# Patient Record
Sex: Female | Born: 1945
Health system: Southern US, Community
[De-identification: ages and names within clinical notes are randomized; demographics above are authoritative.]

## PROBLEM LIST (undated history)

## (undated) ENCOUNTER — Emergency Department (HOSPITAL_COMMUNITY): Payer: Medicare Other

## (undated) DIAGNOSIS — Z8489 Family history of other specified conditions: Secondary | ICD-10-CM

## (undated) DIAGNOSIS — R350 Frequency of micturition: Secondary | ICD-10-CM

## (undated) DIAGNOSIS — Z8719 Personal history of other diseases of the digestive system: Secondary | ICD-10-CM

## (undated) DIAGNOSIS — M2550 Pain in unspecified joint: Secondary | ICD-10-CM

## (undated) DIAGNOSIS — Z860100 Personal history of colon polyps, unspecified: Secondary | ICD-10-CM

## (undated) DIAGNOSIS — K5790 Diverticulosis of intestine, part unspecified, without perforation or abscess without bleeding: Secondary | ICD-10-CM

## (undated) DIAGNOSIS — R200 Anesthesia of skin: Secondary | ICD-10-CM

## (undated) DIAGNOSIS — D509 Iron deficiency anemia, unspecified: Secondary | ICD-10-CM

## (undated) DIAGNOSIS — M1990 Unspecified osteoarthritis, unspecified site: Secondary | ICD-10-CM

## (undated) DIAGNOSIS — K219 Gastro-esophageal reflux disease without esophagitis: Secondary | ICD-10-CM

## (undated) DIAGNOSIS — Z8709 Personal history of other diseases of the respiratory system: Secondary | ICD-10-CM

## (undated) DIAGNOSIS — C50919 Malignant neoplasm of unspecified site of unspecified female breast: Secondary | ICD-10-CM

## (undated) DIAGNOSIS — G629 Polyneuropathy, unspecified: Secondary | ICD-10-CM

## (undated) DIAGNOSIS — C50412 Malignant neoplasm of upper-outer quadrant of left female breast: Principal | ICD-10-CM

## (undated) DIAGNOSIS — Z800 Family history of malignant neoplasm of digestive organs: Secondary | ICD-10-CM

## (undated) DIAGNOSIS — C4491 Basal cell carcinoma of skin, unspecified: Secondary | ICD-10-CM

## (undated) DIAGNOSIS — R918 Other nonspecific abnormal finding of lung field: Secondary | ICD-10-CM

## (undated) DIAGNOSIS — Z8049 Family history of malignant neoplasm of other genital organs: Secondary | ICD-10-CM

## (undated) DIAGNOSIS — Z86711 Personal history of pulmonary embolism: Secondary | ICD-10-CM

## (undated) DIAGNOSIS — E559 Vitamin D deficiency, unspecified: Secondary | ICD-10-CM

## (undated) DIAGNOSIS — Z1371 Encounter for nonprocreative screening for genetic disease carrier status: Secondary | ICD-10-CM

## (undated) DIAGNOSIS — Z923 Personal history of irradiation: Secondary | ICD-10-CM

## (undated) DIAGNOSIS — IMO0002 Reserved for concepts with insufficient information to code with codable children: Secondary | ICD-10-CM

## (undated) DIAGNOSIS — R42 Dizziness and giddiness: Secondary | ICD-10-CM

## (undated) DIAGNOSIS — J302 Other seasonal allergic rhinitis: Secondary | ICD-10-CM

## (undated) DIAGNOSIS — I700 Atherosclerosis of aorta: Secondary | ICD-10-CM

## (undated) DIAGNOSIS — E119 Type 2 diabetes mellitus without complications: Secondary | ICD-10-CM

## (undated) DIAGNOSIS — I10 Essential (primary) hypertension: Secondary | ICD-10-CM

## (undated) DIAGNOSIS — Z803 Family history of malignant neoplasm of breast: Secondary | ICD-10-CM

## (undated) DIAGNOSIS — IMO0001 Reserved for inherently not codable concepts without codable children: Secondary | ICD-10-CM

## (undated) DIAGNOSIS — H269 Unspecified cataract: Secondary | ICD-10-CM

## (undated) HISTORY — DX: Reserved for concepts with insufficient information to code with codable children: IMO0002

## (undated) HISTORY — DX: Family history of malignant neoplasm of digestive organs: Z80.0

## (undated) HISTORY — PX: CATARACT EXTRACTION: SUR2

## (undated) HISTORY — DX: Type 2 diabetes mellitus without complications: E11.9

## (undated) HISTORY — PX: BREAST BIOPSY: SHX20

## (undated) HISTORY — PX: BREAST LUMPECTOMY: SHX2

## (undated) HISTORY — PX: COLONOSCOPY: SHX174

## (undated) HISTORY — DX: Reserved for inherently not codable concepts without codable children: IMO0001

## (undated) HISTORY — DX: Family history of malignant neoplasm of other genital organs: Z80.49

## (undated) HISTORY — DX: Personal history of pulmonary embolism: Z86.711

## (undated) HISTORY — DX: Malignant neoplasm of upper-outer quadrant of left female breast: C50.412

## (undated) HISTORY — DX: Family history of malignant neoplasm of breast: Z80.3

## (undated) HISTORY — PX: TUBAL LIGATION: SHX77

## (undated) HISTORY — PX: POLYPECTOMY: SHX149

---

## 1998-07-14 ENCOUNTER — Ambulatory Visit (HOSPITAL_COMMUNITY): Admission: RE | Admit: 1998-07-14 | Discharge: 1998-07-14 | Payer: Self-pay | Admitting: General Surgery

## 2007-08-15 ENCOUNTER — Other Ambulatory Visit: Admission: RE | Admit: 2007-08-15 | Discharge: 2007-08-15 | Payer: Self-pay | Admitting: Family Medicine

## 2008-09-22 ENCOUNTER — Other Ambulatory Visit: Admission: RE | Admit: 2008-09-22 | Discharge: 2008-09-22 | Payer: Self-pay | Admitting: Family Medicine

## 2009-10-20 ENCOUNTER — Encounter: Admission: RE | Admit: 2009-10-20 | Discharge: 2009-10-20 | Payer: Self-pay | Admitting: Family Medicine

## 2009-12-21 ENCOUNTER — Other Ambulatory Visit: Admission: RE | Admit: 2009-12-21 | Discharge: 2009-12-21 | Payer: Self-pay | Admitting: Family Medicine

## 2011-02-09 ENCOUNTER — Other Ambulatory Visit: Payer: Self-pay | Admitting: Family Medicine

## 2011-02-09 DIAGNOSIS — Z1231 Encounter for screening mammogram for malignant neoplasm of breast: Secondary | ICD-10-CM

## 2011-02-22 ENCOUNTER — Ambulatory Visit
Admission: RE | Admit: 2011-02-22 | Discharge: 2011-02-22 | Disposition: A | Discharge status: Home / Self Care | Payer: BC Managed Care – PPO | Source: Ambulatory Visit | Attending: Family Medicine | Admitting: Family Medicine

## 2011-02-22 DIAGNOSIS — Z1231 Encounter for screening mammogram for malignant neoplasm of breast: Secondary | ICD-10-CM

## 2011-03-16 ENCOUNTER — Other Ambulatory Visit: Payer: Self-pay | Admitting: Family Medicine

## 2011-03-16 ENCOUNTER — Other Ambulatory Visit (HOSPITAL_COMMUNITY)
Admission: RE | Admit: 2011-03-16 | Discharge: 2011-03-16 | Disposition: A | Discharge status: Home / Self Care | Payer: BC Managed Care – PPO | Source: Ambulatory Visit | Attending: Family Medicine | Admitting: Family Medicine

## 2011-03-16 DIAGNOSIS — Z1159 Encounter for screening for other viral diseases: Secondary | ICD-10-CM | POA: Insufficient documentation

## 2011-03-16 DIAGNOSIS — Z124 Encounter for screening for malignant neoplasm of cervix: Secondary | ICD-10-CM | POA: Insufficient documentation

## 2012-02-01 ENCOUNTER — Other Ambulatory Visit: Payer: Self-pay | Admitting: Family Medicine

## 2012-02-01 DIAGNOSIS — Z1231 Encounter for screening mammogram for malignant neoplasm of breast: Secondary | ICD-10-CM

## 2012-02-24 ENCOUNTER — Ambulatory Visit
Admission: RE | Admit: 2012-02-24 | Discharge: 2012-02-24 | Disposition: A | Discharge status: Home / Self Care | Payer: BC Managed Care – PPO | Source: Ambulatory Visit | Attending: Family Medicine | Admitting: Family Medicine

## 2012-02-24 DIAGNOSIS — Z1231 Encounter for screening mammogram for malignant neoplasm of breast: Secondary | ICD-10-CM

## 2012-02-27 ENCOUNTER — Other Ambulatory Visit: Payer: Self-pay | Admitting: Family Medicine

## 2012-02-27 DIAGNOSIS — R928 Other abnormal and inconclusive findings on diagnostic imaging of breast: Secondary | ICD-10-CM

## 2012-03-09 ENCOUNTER — Other Ambulatory Visit: Payer: Self-pay | Admitting: Family Medicine

## 2012-03-09 ENCOUNTER — Ambulatory Visit
Admission: RE | Admit: 2012-03-09 | Discharge: 2012-03-09 | Disposition: A | Discharge status: Home / Self Care | Payer: BC Managed Care – PPO | Source: Ambulatory Visit | Attending: Family Medicine | Admitting: Family Medicine

## 2012-03-09 DIAGNOSIS — R928 Other abnormal and inconclusive findings on diagnostic imaging of breast: Secondary | ICD-10-CM

## 2012-03-23 ENCOUNTER — Other Ambulatory Visit: Payer: Self-pay | Admitting: Family Medicine

## 2012-03-23 ENCOUNTER — Ambulatory Visit
Admission: RE | Admit: 2012-03-23 | Discharge: 2012-03-23 | Disposition: A | Discharge status: Home / Self Care | Payer: BC Managed Care – PPO | Source: Ambulatory Visit | Attending: Family Medicine | Admitting: Family Medicine

## 2012-03-23 DIAGNOSIS — R928 Other abnormal and inconclusive findings on diagnostic imaging of breast: Secondary | ICD-10-CM

## 2012-10-12 ENCOUNTER — Other Ambulatory Visit: Payer: Self-pay | Admitting: Orthopedic Surgery

## 2012-10-16 ENCOUNTER — Encounter (HOSPITAL_COMMUNITY): Payer: Self-pay | Admitting: Pharmacy Technician

## 2012-10-18 ENCOUNTER — Encounter (HOSPITAL_COMMUNITY)
Admission: RE | Admit: 2012-10-18 | Discharge: 2012-10-18 | Disposition: A | Discharge status: Home / Self Care | Payer: BC Managed Care – PPO | Source: Ambulatory Visit | Attending: Orthopedic Surgery | Admitting: Orthopedic Surgery

## 2012-10-18 ENCOUNTER — Encounter (HOSPITAL_COMMUNITY): Payer: Self-pay

## 2012-10-18 DIAGNOSIS — Z01818 Encounter for other preprocedural examination: Secondary | ICD-10-CM | POA: Insufficient documentation

## 2012-10-18 DIAGNOSIS — Z0183 Encounter for blood typing: Secondary | ICD-10-CM | POA: Insufficient documentation

## 2012-10-18 DIAGNOSIS — Z01810 Encounter for preprocedural cardiovascular examination: Secondary | ICD-10-CM | POA: Insufficient documentation

## 2012-10-18 DIAGNOSIS — Z01812 Encounter for preprocedural laboratory examination: Secondary | ICD-10-CM | POA: Insufficient documentation

## 2012-10-18 HISTORY — DX: Unspecified osteoarthritis, unspecified site: M19.90

## 2012-10-18 HISTORY — DX: Essential (primary) hypertension: I10

## 2012-10-18 HISTORY — DX: Gastro-esophageal reflux disease without esophagitis: K21.9

## 2012-10-18 LAB — CBC WITH DIFFERENTIAL/PLATELET
Basophils Absolute: 0 10*3/uL (ref 0.0–0.1)
Basophils Relative: 1 % (ref 0–1)
Hemoglobin: 13.1 g/dL (ref 12.0–15.0)
MCHC: 33.4 g/dL (ref 30.0–36.0)
Monocytes Relative: 6 % (ref 3–12)
Neutro Abs: 5 10*3/uL (ref 1.7–7.7)
Neutrophils Relative %: 65 % (ref 43–77)
RBC: 4.06 MIL/uL (ref 3.87–5.11)

## 2012-10-18 LAB — TYPE AND SCREEN: ABO/RH(D): O POS

## 2012-10-18 LAB — URINALYSIS, ROUTINE W REFLEX MICROSCOPIC
Glucose, UA: NEGATIVE mg/dL
Hgb urine dipstick: NEGATIVE
Ketones, ur: NEGATIVE mg/dL
Protein, ur: NEGATIVE mg/dL

## 2012-10-18 LAB — SURGICAL PCR SCREEN: MRSA, PCR: NEGATIVE

## 2012-10-18 LAB — BASIC METABOLIC PANEL
BUN: 16 mg/dL (ref 6–23)
GFR calc Af Amer: >90 mL/min (ref >90)
GFR calc non Af Amer: 86 mL/min — ABNORMAL LOW (ref >90)
Potassium: 3.3 mEq/L — ABNORMAL LOW (ref 3.5–5.1)
Sodium: 138 mEq/L (ref 135–145)

## 2012-10-18 LAB — PROTIME-INR: INR: 0.93 (ref 0.00–1.49)

## 2012-10-18 LAB — ABO/RH: ABO/RH(D): O POS

## 2012-10-18 LAB — URINE MICROSCOPIC-ADD ON

## 2012-10-18 NOTE — Pre-Procedure Instructions (Signed)
Kim Castro  10/18/2012   Your procedure is scheduled on:  Wednesday, June 25th  Report to [facility] at 1045 AM. Come to main entrance "A" and go to east elevators up to 3rd floor. Check in at short stay desk.  Call this number if you have problems the morning of surgery: [phone]   Remember:   Do not eat food or drink liquids after midnight.   Take these medicines the morning of surgery with A SIP OF WATER: none Stop taking Aspirin, ibuprofen 5 days prior to surgery   Do not wear jewelry, make-up or nail polish.  Do not wear lotions, powders, or perfume, deodorant.  Do not shave 48 hours prior to surgery. Men may shave face and neck.  Do not bring valuables to the hospital.  [facility] is not responsible  for any belongings or valuables.  Contacts, dentures or bridgework may not be worn into surgery.  Leave suitcase in the car. After surgery it may be brought to your room.  For patients admitted to the hospital, checkout time is 11:00 AM the day of discharge.   Patients discharged the day of surgery will not be allowed to drive home.    Special Instructions: Shower using CHG 2 nights before surgery and the night before surgery.  If you shower the day of surgery use CHG.  Use special wash - you have one bottle of CHG for all showers.  You should use approximately 1/3 of the bottle for each shower.   Please read over the following fact sheets that you were given: Pain Booklet, Coughing and Deep Breathing, Blood Transfusion Information, MRSA Information and Surgical Site Infection Prevention

## 2012-10-18 NOTE — Progress Notes (Signed)
Primary physician - dr. Caryn Bee little No recent cardiac testing had ekg several years no cardiologist

## 2012-10-18 NOTE — Progress Notes (Signed)
10/18/12 0958  OBSTRUCTIVE SLEEP APNEA  Have you ever been diagnosed with sleep apnea through a sleep study? No  Do you snore loudly (loud enough to be heard through closed doors)?  1  Do you often feel tired, fatigued, or sleepy during the daytime? 0  Has anyone observed you stop breathing during your sleep? 0  Do you have, or are you being treated for high blood pressure? 1  BMI more than 35 kg/m2? 1  Age over 65 years old? 1  Neck circumference greater than 40 cm/18 inches? 0  Gender: 0  Obstructive Sleep Apnea Score 4  Score 4 or greater  Results sent to PCP

## 2012-10-23 MED ORDER — DEXTROSE 5 % IV SOLN
3.0000 g | INTRAVENOUS | Status: AC
  Administered 2012-10-24: 3 g via INTRAVENOUS
  Filled 2012-10-23: qty 3000

## 2012-10-23 MED ORDER — DEXTROSE-NACL 5-0.45 % IV SOLN: INTRAVENOUS | Status: DC

## 2012-10-23 MED ORDER — CHLORHEXIDINE GLUCONATE 4 % EX LIQD: 60.0000 mL | Freq: Once | CUTANEOUS | Status: DC

## 2012-10-23 NOTE — H&P (Signed)
Kim Castro is an 67 y.o. female.   Chief Complaint: Left knee pain HPI: Patient returns today reporting continued pain, left greater than right knee which has known end-stage arthritis bone-on-bone medially with lateral subluxation of tibia beneath the femur.  The meloxicam that we try last month really didn't do anything she is not interested in cortisone shots or Visco supplementation and based on the x-rays that show erosion of the medial tibial plateaus with the lateral subluxation that is probably reasonable.  She has had knee pain for 7 years.  Onset was gradual and progressive.  The patient is been walking with a limp.  In the last few months the pain wakes her up at night.  The pain also makes it difficult for her to take care of her 2 grandchildren.    Past Medical History  Diagnosis Date  . Hypertension   . GERD (gastroesophageal reflux disease)   . Arthritis     Past Surgical History  Procedure Laterality Date  . Tubal ligation      No family history on file. Social History:  reports that she has never smoked. She does not have any smokeless tobacco history on file. She reports that she does not drink alcohol or use illicit drugs.  Allergies: No Known Allergies  No prescriptions prior to admission    No results found for this or any previous visit (from the past 48 hour(s)). No results found.  Review of Systems  Constitutional: Negative.   HENT: Negative.   Eyes: Negative.   Respiratory: Negative.   Cardiovascular: Negative.   Gastrointestinal: Negative.   Genitourinary: Negative.   Musculoskeletal: Positive for joint pain.  Skin: Negative.   Neurological: Negative.   Endo/Heme/Allergies: Negative.   Psychiatric/Behavioral: Negative.     There were no vitals taken for this visit. Physical Exam  Constitutional: She is oriented to person, place, and time. She appears well-developed and well-nourished.  HENT:  Head: Normocephalic.  Eyes: Pupils are equal,  round, and reactive to light.  Cardiovascular: Intact distal pulses.   Respiratory: Breath sounds normal.  Musculoskeletal:       Left knee: She exhibits decreased range of motion. Tenderness found.  Neurological: She is alert and oriented to person, place, and time.  Psychiatric: She has a normal mood and affect.     Assessment/Plan Assess: End-stage arthritis medial compartment left greater than right knee bone-on-bone with lateral subluxation of tibia beneath the femur.  Plan: After discussing options, as well as risks and benefits the patient has chosen knee replacement.  We'll get that set up for her at her convenience.  I will see her back in the office after her surgical intervention.    Yasmin Bronaugh M. 10/23/2012, 8:38 AM

## 2012-10-24 ENCOUNTER — Encounter (HOSPITAL_COMMUNITY): Payer: Self-pay | Admitting: *Deleted

## 2012-10-24 ENCOUNTER — Encounter (HOSPITAL_COMMUNITY): Payer: Self-pay | Admitting: Certified Registered"

## 2012-10-24 ENCOUNTER — Ambulatory Visit (HOSPITAL_COMMUNITY): Payer: BC Managed Care – PPO | Admitting: Certified Registered"

## 2012-10-24 ENCOUNTER — Inpatient Hospital Stay (HOSPITAL_COMMUNITY)
Admission: RE | Admit: 2012-10-24 | Discharge: 2012-10-27 | DRG: 209 | Disposition: A | Discharge status: Home / Self Care | Payer: BC Managed Care – PPO | Source: Ambulatory Visit | Attending: Orthopedic Surgery | Admitting: Orthopedic Surgery

## 2012-10-24 ENCOUNTER — Encounter (HOSPITAL_COMMUNITY)
Admission: RE | Disposition: A | Discharge status: Home / Self Care | Payer: Self-pay | Source: Ambulatory Visit | Attending: Orthopedic Surgery

## 2012-10-24 DIAGNOSIS — I10 Essential (primary) hypertension: Secondary | ICD-10-CM | POA: Diagnosis present

## 2012-10-24 DIAGNOSIS — M1712 Unilateral primary osteoarthritis, left knee: Secondary | ICD-10-CM

## 2012-10-24 DIAGNOSIS — K219 Gastro-esophageal reflux disease without esophagitis: Secondary | ICD-10-CM | POA: Diagnosis present

## 2012-10-24 DIAGNOSIS — Z7982 Long term (current) use of aspirin: Secondary | ICD-10-CM

## 2012-10-24 DIAGNOSIS — M1710 Unilateral primary osteoarthritis, unspecified knee: Principal | ICD-10-CM | POA: Diagnosis present

## 2012-10-24 HISTORY — PX: TOTAL KNEE ARTHROPLASTY: SHX125

## 2012-10-24 SURGERY — ARTHROPLASTY, KNEE, TOTAL
Anesthesia: General | Site: Knee | Laterality: Left | Wound class: Clean

## 2012-10-24 MED ORDER — BUPIVACAINE-EPINEPHRINE PF 0.5-1:200000 % IJ SOLN
INTRAMUSCULAR | Status: DC | PRN
  Administered 2012-10-24: 30 mL

## 2012-10-24 MED ORDER — NEOSTIGMINE METHYLSULFATE 1 MG/ML IJ SOLN
INTRAMUSCULAR | Status: DC | PRN
  Administered 2012-10-24: 3 mg via INTRAVENOUS

## 2012-10-24 MED ORDER — LACTATED RINGERS IV SOLN: INTRAVENOUS | Status: DC

## 2012-10-24 MED ORDER — OXYCODONE HCL 5 MG PO TABS: 5.0000 mg | ORAL_TABLET | Freq: Once | ORAL | Status: DC | PRN

## 2012-10-24 MED ORDER — LIDOCAINE HCL (CARDIAC) 20 MG/ML IV SOLN
INTRAVENOUS | Status: DC | PRN
  Administered 2012-10-24: 50 mg via INTRAVENOUS

## 2012-10-24 MED ORDER — LOSARTAN POTASSIUM 25 MG PO TABS
25.0000 mg | ORAL_TABLET | Freq: Every day | ORAL | Status: DC
  Administered 2012-10-24 – 2012-10-25 (×2): 25 mg via ORAL
  Filled 2012-10-24 (×3): qty 1

## 2012-10-24 MED ORDER — ONDANSETRON HCL 4 MG/2ML IJ SOLN
INTRAMUSCULAR | Status: DC | PRN
  Administered 2012-10-24: 4 mg via INTRAVENOUS

## 2012-10-24 MED ORDER — CEFUROXIME SODIUM 1.5 G IJ SOLR
INTRAMUSCULAR | Status: DC | PRN
  Administered 2012-10-24: 1.5 g

## 2012-10-24 MED ORDER — METHOCARBAMOL 500 MG PO TABS: 500.0000 mg | ORAL_TABLET | Freq: Four times a day (QID) | ORAL | Status: DC

## 2012-10-24 MED ORDER — KCL IN DEXTROSE-NACL 20-5-0.45 MEQ/L-%-% IV SOLN
INTRAVENOUS | Status: DC
  Administered 2012-10-24: 19:00:00 via INTRAVENOUS
  Administered 2012-10-25: 30 mL/h via INTRAVENOUS
  Filled 2012-10-24 (×10): qty 1000

## 2012-10-24 MED ORDER — ONDANSETRON HCL 4 MG PO TABS: 4.0000 mg | ORAL_TABLET | Freq: Four times a day (QID) | ORAL | Status: DC | PRN

## 2012-10-24 MED ORDER — METOCLOPRAMIDE HCL 5 MG/ML IJ SOLN: 5.0000 mg | Freq: Three times a day (TID) | INTRAMUSCULAR | Status: DC | PRN

## 2012-10-24 MED ORDER — LACTATED RINGERS IV SOLN
INTRAVENOUS | Status: DC | PRN
  Administered 2012-10-24 (×2): via INTRAVENOUS

## 2012-10-24 MED ORDER — MIDAZOLAM HCL 5 MG/5ML IJ SOLN
INTRAMUSCULAR | Status: DC | PRN
  Administered 2012-10-24: 2 mg via INTRAVENOUS

## 2012-10-24 MED ORDER — HYDROMORPHONE HCL PF 1 MG/ML IJ SOLN
1.0000 mg | INTRAMUSCULAR | Status: DC | PRN
  Administered 2012-10-24: 1 mg via INTRAVENOUS
  Filled 2012-10-24: qty 1

## 2012-10-24 MED ORDER — CELECOXIB 200 MG PO CAPS
200.0000 mg | ORAL_CAPSULE | Freq: Two times a day (BID) | ORAL | Status: DC
  Administered 2012-10-24 – 2012-10-27 (×6): 200 mg via ORAL
  Filled 2012-10-24 (×7): qty 1

## 2012-10-24 MED ORDER — PHENOL 1.4 % MT LIQD: 1.0000 | OROMUCOSAL | Status: DC | PRN

## 2012-10-24 MED ORDER — METHOCARBAMOL 100 MG/ML IJ SOLN
500.0000 mg | Freq: Four times a day (QID) | INTRAVENOUS | Status: DC | PRN
  Filled 2012-10-24: qty 5

## 2012-10-24 MED ORDER — PROPOFOL 10 MG/ML IV BOLUS
INTRAVENOUS | Status: DC | PRN
  Administered 2012-10-24: 160 mg via INTRAVENOUS
  Administered 2012-10-24: 40 mg via INTRAVENOUS

## 2012-10-24 MED ORDER — ROCURONIUM BROMIDE 100 MG/10ML IV SOLN
INTRAVENOUS | Status: DC | PRN
  Administered 2012-10-24: 50 mg via INTRAVENOUS

## 2012-10-24 MED ORDER — ACETAMINOPHEN 325 MG PO TABS
650.0000 mg | ORAL_TABLET | Freq: Four times a day (QID) | ORAL | Status: DC | PRN
  Administered 2012-10-26: 650 mg via ORAL
  Filled 2012-10-24: qty 2

## 2012-10-24 MED ORDER — BUPIVACAINE-EPINEPHRINE (PF) 0.5% -1:200000 IJ SOLN
INTRAMUSCULAR | Status: AC
  Filled 2012-10-24: qty 10

## 2012-10-24 MED ORDER — GLYCOPYRROLATE 0.2 MG/ML IJ SOLN
INTRAMUSCULAR | Status: DC | PRN
  Administered 2012-10-24: 0.4 mg via INTRAVENOUS

## 2012-10-24 MED ORDER — DIPHENHYDRAMINE HCL 12.5 MG/5ML PO ELIX: 12.5000 mg | ORAL_SOLUTION | ORAL | Status: DC | PRN

## 2012-10-24 MED ORDER — OXYCODONE-ACETAMINOPHEN 5-325 MG PO TABS: 1.0000 | ORAL_TABLET | ORAL | Status: DC | PRN

## 2012-10-24 MED ORDER — METOCLOPRAMIDE HCL 10 MG PO TABS: 5.0000 mg | ORAL_TABLET | Freq: Three times a day (TID) | ORAL | Status: DC | PRN

## 2012-10-24 MED ORDER — DEXAMETHASONE SODIUM PHOSPHATE 4 MG/ML IJ SOLN
INTRAMUSCULAR | Status: DC | PRN
  Administered 2012-10-24: 8 mg via INTRAVENOUS

## 2012-10-24 MED ORDER — OXYCODONE HCL 5 MG PO TABS
5.0000 mg | ORAL_TABLET | ORAL | Status: DC | PRN
  Administered 2012-10-25 (×3): 10 mg via ORAL
  Administered 2012-10-25 (×2): 5 mg via ORAL
  Administered 2012-10-25 – 2012-10-27 (×7): 10 mg via ORAL
  Filled 2012-10-24 (×6): qty 2
  Filled 2012-10-24: qty 1
  Filled 2012-10-24 (×2): qty 2
  Filled 2012-10-24: qty 1
  Filled 2012-10-24 (×4): qty 2

## 2012-10-24 MED ORDER — ACETAMINOPHEN 650 MG RE SUPP: 650.0000 mg | Freq: Four times a day (QID) | RECTAL | Status: DC | PRN

## 2012-10-24 MED ORDER — SODIUM CHLORIDE 0.9 % IR SOLN
Status: DC | PRN
  Administered 2012-10-24: 3000 mL

## 2012-10-24 MED ORDER — METHOCARBAMOL 500 MG PO TABS
500.0000 mg | ORAL_TABLET | Freq: Four times a day (QID) | ORAL | Status: DC | PRN
  Administered 2012-10-25 – 2012-10-27 (×5): 500 mg via ORAL
  Filled 2012-10-24 (×7): qty 1

## 2012-10-24 MED ORDER — HYDROMORPHONE HCL PF 1 MG/ML IJ SOLN
0.2500 mg | INTRAMUSCULAR | Status: DC | PRN
  Administered 2012-10-24: 0.5 mg via INTRAVENOUS

## 2012-10-24 MED ORDER — CEFUROXIME SODIUM 1.5 G IJ SOLR
INTRAMUSCULAR | Status: AC
  Filled 2012-10-24: qty 1.5

## 2012-10-24 MED ORDER — SODIUM CHLORIDE 0.9 % IJ SOLN
INTRAMUSCULAR | Status: DC | PRN
  Administered 2012-10-24: 13:00:00

## 2012-10-24 MED ORDER — MENTHOL 3 MG MT LOZG: 1.0000 | LOZENGE | OROMUCOSAL | Status: DC | PRN

## 2012-10-24 MED ORDER — ASPIRIN EC 325 MG PO TBEC
325.0000 mg | DELAYED_RELEASE_TABLET | Freq: Two times a day (BID) | ORAL | Status: DC
  Administered 2012-10-24 – 2012-10-27 (×6): 325 mg via ORAL
  Filled 2012-10-24 (×7): qty 1

## 2012-10-24 MED ORDER — HYDROCHLOROTHIAZIDE 25 MG PO TABS
25.0000 mg | ORAL_TABLET | Freq: Every day | ORAL | Status: DC
  Administered 2012-10-24 – 2012-10-25 (×2): 25 mg via ORAL
  Filled 2012-10-24 (×3): qty 1

## 2012-10-24 MED ORDER — BUPIVACAINE LIPOSOME 1.3 % IJ SUSP
20.0000 mL | Freq: Once | INTRAMUSCULAR | Status: DC
  Filled 2012-10-24: qty 20

## 2012-10-24 MED ORDER — MAGNESIUM HYDROXIDE 400 MG/5ML PO SUSP: 30.0000 mL | Freq: Every day | ORAL | Status: DC | PRN

## 2012-10-24 MED ORDER — ASPIRIN EC 325 MG PO TBEC: 325.0000 mg | DELAYED_RELEASE_TABLET | Freq: Two times a day (BID) | ORAL | Status: DC

## 2012-10-24 MED ORDER — ALUM & MAG HYDROXIDE-SIMETH 200-200-20 MG/5ML PO SUSP
30.0000 mL | ORAL | Status: DC | PRN
  Filled 2012-10-24: qty 30

## 2012-10-24 MED ORDER — FENTANYL CITRATE 0.05 MG/ML IJ SOLN
INTRAMUSCULAR | Status: DC | PRN
  Administered 2012-10-24 (×4): 50 ug via INTRAVENOUS
  Administered 2012-10-24: 100 ug via INTRAVENOUS

## 2012-10-24 MED ORDER — FLEET ENEMA 7-19 GM/118ML RE ENEM: 1.0000 | ENEMA | Freq: Once | RECTAL | Status: AC | PRN

## 2012-10-24 MED ORDER — OXYCODONE HCL 5 MG/5ML PO SOLN: 5.0000 mg | Freq: Once | ORAL | Status: DC | PRN

## 2012-10-24 MED ORDER — HYDROMORPHONE HCL PF 1 MG/ML IJ SOLN
INTRAMUSCULAR | Status: AC
  Filled 2012-10-24: qty 1

## 2012-10-24 MED ORDER — BISACODYL 5 MG PO TBEC: 5.0000 mg | DELAYED_RELEASE_TABLET | Freq: Every day | ORAL | Status: DC | PRN

## 2012-10-24 MED ORDER — ONDANSETRON HCL 4 MG/2ML IJ SOLN
4.0000 mg | Freq: Four times a day (QID) | INTRAMUSCULAR | Status: DC | PRN
  Administered 2012-10-26: 4 mg via INTRAVENOUS
  Filled 2012-10-24: qty 2

## 2012-10-24 MED ORDER — TRANEXAMIC ACID 100 MG/ML IV SOLN
1000.0000 mg | INTRAVENOUS | Status: AC
  Administered 2012-10-24: 1000 mg via INTRAVENOUS
  Filled 2012-10-24: qty 10

## 2012-10-24 SURGICAL SUPPLY — 60 items
BANDAGE ESMARK 6X9 LF (GAUZE/BANDAGES/DRESSINGS) ×1 IMPLANT
BLADE SAG 18X100X1.27 (BLADE) ×2 IMPLANT
BLADE SAW SGTL 13X75X1.27 (BLADE) ×2 IMPLANT
BLADE SURG ROTATE 9660 (MISCELLANEOUS) IMPLANT
BNDG ELASTIC 6X10 VLCR STRL LF (GAUZE/BANDAGES/DRESSINGS) ×2 IMPLANT
BNDG ESMARK 6X9 LF (GAUZE/BANDAGES/DRESSINGS) ×2
BOWL SMART MIX CTS (DISPOSABLE) ×2 IMPLANT
CAPT RP KNEE ×2 IMPLANT
CEMENT HV SMART SET (Cement) ×4 IMPLANT
CLOTH BEACON ORANGE TIMEOUT ST (SAFETY) ×2 IMPLANT
COVER SURGICAL LIGHT HANDLE (MISCELLANEOUS) ×2 IMPLANT
CUFF TOURNIQUET SINGLE 34IN LL (TOURNIQUET CUFF) IMPLANT
CUFF TOURNIQUET SINGLE 44IN (TOURNIQUET CUFF) IMPLANT
DRAPE EXTREMITY T 121X128X90 (DRAPE) ×2 IMPLANT
DRAPE U-SHAPE 47X51 STRL (DRAPES) ×2 IMPLANT
DURAPREP 26ML APPLICATOR (WOUND CARE) ×2 IMPLANT
ELECT REM PT RETURN 9FT ADLT (ELECTROSURGICAL) ×2
ELECTRODE REM PT RTRN 9FT ADLT (ELECTROSURGICAL) ×1 IMPLANT
EVACUATOR 1/8 PVC DRAIN (DRAIN) ×2 IMPLANT
GAUZE XEROFORM 1X8 LF (GAUZE/BANDAGES/DRESSINGS) ×2 IMPLANT
GLOVE BIO SURGEON STRL SZ7 (GLOVE) ×2 IMPLANT
GLOVE BIO SURGEON STRL SZ7.5 (GLOVE) ×6 IMPLANT
GLOVE BIOGEL PI IND STRL 7.0 (GLOVE) ×1 IMPLANT
GLOVE BIOGEL PI IND STRL 8 (GLOVE) ×1 IMPLANT
GLOVE BIOGEL PI INDICATOR 7.0 (GLOVE) ×1
GLOVE BIOGEL PI INDICATOR 8 (GLOVE) ×1
GLOVE BIOGEL PI ORTHO PRO 7.5 (GLOVE) ×1
GLOVE NEODERM STER SZ 7 (GLOVE) ×2 IMPLANT
GLOVE PI ORTHO PRO STRL 7.5 (GLOVE) ×1 IMPLANT
GLOVE SURG SS PI 7.5 STRL IVOR (GLOVE) ×2 IMPLANT
GOWN PREVENTION PLUS XLARGE (GOWN DISPOSABLE) ×2 IMPLANT
GOWN SRG XL XLNG 56XLVL 4 (GOWN DISPOSABLE) ×1 IMPLANT
GOWN STRL NON-REIN LRG LVL3 (GOWN DISPOSABLE) ×4 IMPLANT
GOWN STRL NON-REIN XL XLG LVL4 (GOWN DISPOSABLE) ×1
HANDPIECE INTERPULSE COAX TIP (DISPOSABLE) ×1
HOOD PEEL AWAY FACE SHEILD DIS (HOOD) ×4 IMPLANT
KIT BASIN OR (CUSTOM PROCEDURE TRAY) ×2 IMPLANT
KIT ROOM TURNOVER OR (KITS) ×2 IMPLANT
MANIFOLD NEPTUNE II (INSTRUMENTS) ×2 IMPLANT
NEEDLE 18GX1X1/2 (RX/OR ONLY) (NEEDLE) ×2 IMPLANT
NEEDLE 22X1 1/2 (OR ONLY) (NEEDLE) ×2 IMPLANT
NS IRRIG 1000ML POUR BTL (IV SOLUTION) ×2 IMPLANT
PACK TOTAL JOINT (CUSTOM PROCEDURE TRAY) ×2 IMPLANT
PAD ARMBOARD 7.5X6 YLW CONV (MISCELLANEOUS) ×4 IMPLANT
PADDING CAST COTTON 6X4 STRL (CAST SUPPLIES) ×2 IMPLANT
SET HNDPC FAN SPRY TIP SCT (DISPOSABLE) ×1 IMPLANT
SPONGE GAUZE 4X4 12PLY (GAUZE/BANDAGES/DRESSINGS) ×2 IMPLANT
STAPLER VISISTAT 35W (STAPLE) ×4 IMPLANT
SUCTION FRAZIER TIP 10 FR DISP (SUCTIONS) ×2 IMPLANT
SUT VIC AB 0 CTX 36 (SUTURE) ×2
SUT VIC AB 0 CTX36XBRD ANTBCTR (SUTURE) ×2 IMPLANT
SUT VIC AB 1 CTX 36 (SUTURE) ×1
SUT VIC AB 1 CTX36XBRD ANBCTR (SUTURE) ×1 IMPLANT
SUT VIC AB 2-0 CT1 27 (SUTURE) ×1
SUT VIC AB 2-0 CT1 TAPERPNT 27 (SUTURE) ×1 IMPLANT
SYR CONTROL 10ML LL (SYRINGE) ×2 IMPLANT
TOWEL OR 17X24 6PK STRL BLUE (TOWEL DISPOSABLE) ×2 IMPLANT
TOWEL OR 17X26 10 PK STRL BLUE (TOWEL DISPOSABLE) ×2 IMPLANT
TRAY FOLEY CATH 14FR (SET/KITS/TRAYS/PACK) ×2 IMPLANT
WATER STERILE IRR 1000ML POUR (IV SOLUTION) ×2 IMPLANT

## 2012-10-24 NOTE — Transfer of Care (Signed)
Immediate Anesthesia Transfer of Care Note  Patient: Kim Castro  Procedure(s) Performed: Procedure(s): TOTAL KNEE ARTHROPLASTY (Left)  Patient Location: PACU  Anesthesia Type:General  Level of Consciousness: awake, alert  and oriented  Airway & Oxygen Therapy: Patient Spontanous Breathing and Patient connected to face mask oxygen  Post-op Assessment: Report given to PACU RN, Post -op Vital signs reviewed and stable and Patient moving all extremities  Post vital signs: Reviewed and stable  Complications: No apparent anesthesia complications

## 2012-10-24 NOTE — Anesthesia Postprocedure Evaluation (Signed)
  Anesthesia Post-op Note  Patient: Kim Castro  Procedure(s) Performed: Procedure(s): TOTAL KNEE ARTHROPLASTY (Left)  Patient Location: PACU  Anesthesia Type:General  Level of Consciousness: awake and alert   Airway and Oxygen Therapy: Patient Spontanous Breathing  Post-op Pain: moderate  Post-op Assessment: Post-op Vital signs reviewed  Post-op Vital Signs: stable  Complications: No apparent anesthesia complications

## 2012-10-24 NOTE — Interval H&P Note (Signed)
History and Physical Interval Note:  10/24/2012 12:16 PM  Kim Castro  has presented today for surgery, with the diagnosis of LEFT KNEE OSTEOARTHRITIS  The various methods of treatment have been discussed with the patient and family. After consideration of risks, benefits and other options for treatment, the patient has consented to  Procedure(s): TOTAL KNEE ARTHROPLASTY (Left) as a surgical intervention .  The patient's history has been reviewed, patient examined, no change in status, stable for surgery.  I have reviewed the patient's chart and labs.  Questions were answered to the patient's satisfaction.     Nestor Lewandowsky

## 2012-10-24 NOTE — Anesthesia Procedure Notes (Signed)
Procedure Name: Intubation Date/Time: 10/24/2012 12:50 PM Performed by: Jerilee Hoh Pre-anesthesia Checklist: Patient identified, Emergency Drugs available, Suction available and Patient being monitored Patient Re-evaluated:Patient Re-evaluated prior to inductionOxygen Delivery Method: Circle system utilized Preoxygenation: Pre-oxygenation with 100% oxygen Intubation Type: IV induction Ventilation: Mask ventilation without difficulty Laryngoscope Size: Mac and 3 Grade View: Grade II Tube type: Oral Tube size: 7.5 mm Number of attempts: 1 Airway Equipment and Method: Stylet Placement Confirmation: ETT inserted through vocal cords under direct vision,  positive ETCO2 and breath sounds checked- equal and bilateral Secured at: 21 cm Tube secured with: Tape Dental Injury: Teeth and Oropharynx as per pre-operative assessment  Comments: Smooth IV induction. Easy mask airway.  2 attempts to place LMA, but unable to obtain seal.  DL x 1, atraumatic intubation.  OGT to suction.

## 2012-10-24 NOTE — Anesthesia Preprocedure Evaluation (Addendum)
Anesthesia Evaluation  Patient identified by MRN, date of birth, ID band Patient awake    Reviewed: Allergy & Precautions, H&P , NPO status , Patient's Chart, lab work & pertinent test results  Airway Mallampati: III TM Distance: >3 FB Neck ROM: Full    Dental no notable dental hx. (+) Teeth Intact and Dental Advisory Given   Pulmonary neg pulmonary ROS,  breath sounds clear to auscultation  Pulmonary exam normal       Cardiovascular hypertension, On Medications Rhythm:Regular Rate:Normal     Neuro/Psych negative neurological ROS  negative psych ROS   GI/Hepatic Neg liver ROS, GERD-  Controlled,  Endo/Other  negative endocrine ROSMorbid obesity  Renal/GU negative Renal ROS  negative genitourinary   Musculoskeletal   Abdominal (+) + obese,   Peds  Hematology negative hematology ROS (+)   Anesthesia Other Findings   Reproductive/Obstetrics negative OB ROS                           Anesthesia Physical Anesthesia Plan  ASA: II  Anesthesia Plan: General   Post-op Pain Management:    Induction: Intravenous  Airway Management Planned: LMA  Additional Equipment:   Intra-op Plan:   Post-operative Plan: Extubation in OR  Informed Consent: I have reviewed the patients History and Physical, chart, labs and discussed the procedure including the risks, benefits and alternatives for the proposed anesthesia with the patient or authorized representative who has indicated his/her understanding and acceptance.   Dental advisory given  Plan Discussed with: CRNA  Anesthesia Plan Comments:        Anesthesia Quick Evaluation

## 2012-10-24 NOTE — Preoperative (Signed)
Beta Blockers   Reason not to administer Beta Blockers:Not Applicable 

## 2012-10-24 NOTE — Progress Notes (Signed)
Orthopedic Tech Progress Note Patient Details:  Kim Castro 08-05-1945 [id number]  CPM Left Knee CPM Left Knee: On Left Knee Flexion (Degrees): 60 Left Knee Extension (Degrees): 0 Put on ohf [name] 10/24/2012, 6:00 PM

## 2012-10-24 NOTE — Op Note (Signed)
PATIENT ID:      Kim Castro  MRN:     [id number] DOB/AGE:    12-04-1945 / 67 y.o.       OPERATIVE REPORT    DATE OF PROCEDURE:  10/24/2012       PREOPERATIVE DIAGNOSIS:   LEFT KNEE OSTEOARTHRITIS      There is no weight on file to calculate BMI.                                                        POSTOPERATIVE DIAGNOSIS:   LEFT KNEE OSTEOARTHRITIS                                                                      PROCEDURE:  Procedure(s): TOTAL KNEE ARTHROPLASTY Using Depuy Sigma RP implants #4NL Femur, #3Tibia, 10mm sigma RP bearing, 35 Patella     SURGEON: Jance Siek J    ASSISTANT:   Shirl Harris PA-C   (Present and scrubbed throughout the case, critical for assistance with exposure, retraction, instrumentation, and closure.)         ANESTHESIA: GET , Exparel  DRAINS: foley, 2 medium hemovac in knee   TOURNIQUET TIME:   COMPLICATIONS:  None     SPECIMENS: None   INDICATIONS FOR PROCEDURE: The patient has  LEFT KNEE OSTEOARTHRITIS, varus deformities, XR shows bone on bone arthritis. Patient has failed all conservative measures including anti-inflammatory medicines, narcotics, attempts at  exercise and weight loss, cortisone injections and viscosupplementation.  Risks and benefits of surgery have been discussed, questions answered.   DESCRIPTION OF PROCEDURE: The patient identified by armband, received  IV antibiotics, in the holding area at [facility]. Patient taken to the operating room, appropriate anesthetic  monitors were attached, and general endotracheal anesthesia induced with  the patient in supine position, Foley catheter was inserted. Tourniquet  applied high to the operative thigh. Lateral post and foot positioner  applied to the table, the lower extremity was then prepped and draped  in usual sterile fashion from the ankle to the tourniquet. Time-out procedure was performed. The limb was wrapped with an Esmarch bandage and the tourniquet  inflated to 350 mmHg. We began the operation by making the anterior midline incision starting at handbreadth above the patella going over the patella 1 cm medial to and  4 cm distal to the tibial tubercle. Small bleeders in the skin and the  subcutaneous tissue identified and cauterized. Transverse retinaculum was incised and reflected medially and a medial parapatellar arthrotomy was accomplished. the patella was everted and theprepatellar fat pad resected. The superficial medial collateral  ligament was then elevated from anterior to posterior along the proximal  flare of the tibia and anterior half of the menisci resected. The knee was hyperflexed exposing bone on bone arthritis. Peripheral and notch osteophytes as well as the cruciate ligaments were then resected. We continued to  work our way around posteriorly along the proximal tibia, and externally  rotated the tibia subluxing it out from underneath the femur. A McHale  retractor was placed through the  notch and a lateral Hohmann retractor  placed, and we then drilled through the proximal tibia in line with the  axis of the tibia followed by an intramedullary guide rod and 2-degree  posterior slope cutting guide. The tibial cutting guide was pinned into place  allowing resection of 4 mm of bone medially and about 9 mm of bone  laterally because of her varus deformity. Satisfied with the tibial resection, we then  entered the distal femur 2 mm anterior to the PCL origin with the  intramedullary guide rod and applied the distal femoral cutting guide  set at 11mm, with 5 degrees of valgus. This was pinned along the  epicondylar axis. At this point, the distal femoral cut was accomplished without difficulty. We then sized for a #4NL femoral component and pinned the guide in 3 degrees of external rotation.The chamfer cutting guide was pinned into place. The anterior, posterior, and chamfer cuts were accomplished without difficulty followed by   the Sigma RP box cutting guide and the box cut. We also removed posterior osteophytes from the posterior femoral condyles. At this  time, the knee was brought into full extension. We checked our  extension and flexion gaps and found them symmetric at 10mm.  The patella thickness measured at 24 mm. We set the cutting guide at 15 and removed the posterior 10 mm  of the patella, sized for a 35 button and drilled the lollipop. The knee  was then once again hyperflexed exposing the proximal tibia. We sized for a #3 tibial base plate, applied the smokestack and the conical reamer followed by the the Delta fin keel punch. We then hammered into place the Sigma RP trial femoral component, inserted a 10-mm trial bearing, trial patellar button, and took the knee through range of motion from 0-130 degrees. No thumb pressure was required for patellar  tracking. At this point, all trial components were removed, a double batch of DePuy HV cement with 1500 mg of Zinacef was mixed and applied to all bony metallic mating surfaces except for the posterior condyles of the femur itself. In order, we  hammered into place the tibial tray and removed excess cement, the femoral component and removed excess cement, a 10-mm Sigma RP bearing  was inserted, and the knee brought to full extension with compression.  The patellar button was clamped into place, and excess cement  removed. While the cement cured the wound was irrigated out with normal saline solution pulse lavage, and medium Hemovac drains were placed from an anterolateral  approach. Ligament stability and patellar tracking were checked and found to be excellent. The parapatellar arthrotomy was closed with  running #1 Vicryl suture. The subcutaneous tissue with 0 and 2-0 undyed  Vicryl suture, and the skin with skin staples. A dressing of Xeroform,  4 x 4, dressing sponges, Webril, and Ace wrap applied. The patient  awakened, extubated, and taken to recovery room  without difficulty.   Gean Birchwood J 10/24/2012, 2:25 PM

## 2012-10-25 LAB — CBC
HCT: 33.1 % — ABNORMAL LOW (ref 36.0–46.0)
Hemoglobin: 10.8 g/dL — ABNORMAL LOW (ref 12.0–15.0)
MCV: 97.4 fL (ref 78.0–100.0)
RDW: 14.3 % (ref 11.5–15.5)
WBC: 14.8 10*3/uL — ABNORMAL HIGH (ref 4.0–10.5)

## 2012-10-25 LAB — BASIC METABOLIC PANEL
BUN: 11 mg/dL (ref 6–23)
Chloride: 102 mEq/L (ref 96–112)
Creatinine, Ser: 0.7 mg/dL (ref 0.50–1.10)
GFR calc Af Amer: >90 mL/min (ref >90)
Glucose, Bld: 160 mg/dL — ABNORMAL HIGH (ref 70–99)
Potassium: 4.1 mEq/L (ref 3.5–5.1)

## 2012-10-25 NOTE — Progress Notes (Signed)
UR COMPLETED  

## 2012-10-25 NOTE — Progress Notes (Signed)
Physical Therapy Treatment Patient Details Name: Kim Castro MRN: [id number] DOB: 08-18-1945 Today's Date: 10/25/2012 Time: 4098-1191 PT Time Calculation (min): 17 min  PT Assessment / Plan / Recommendation  PT Comments   Pt continues to make progress and able to increase ambulation distance.  Pt will need to practice steps tomorrow prior to d/c and review HEP.   Follow Up Recommendations  Home health PT;Supervision/Assistance - 24 hour     Equipment Recommendations  Rolling walker with 5" wheels;3in1 (PT)    Recommendations for Other Services    Frequency 7X/week   Progress towards PT Goals Progress towards PT goals: Progressing toward goals  Plan Current plan remains appropriate    Precautions / Restrictions Precautions Precautions: Knee Restrictions Weight Bearing Restrictions: Yes LLE Weight Bearing: Weight bearing as tolerated   Pertinent Vitals/Pain 3/10 left LE & increases 8/10 during ambulation and weight bearing on left LE    Mobility  Bed Mobility Bed Mobility: Sit to Supine Sit to Supine: 4: Min assist;HOB flat Details for Bed Mobility Assistance: (A) with left LE into bed with cues for proper technique Transfers Transfers: Sit to Stand;Stand to Sit Sit to Stand: 4: Min guard;From chair/3-in-1 Stand to Sit: 4: Min guard;To bed Details for Transfer Assistance: Minguard for safety with cues for hand and LE placement Ambulation/Gait Ambulation/Gait Assistance: 4: Min guard Ambulation Distance (Feet): 50 Feet Assistive device: Rolling walker Ambulation/Gait Assistance Details: Minguard for safety with cues for upright posture and forward head gaze with cues for proper step sequencwe. Gait Pattern: Step-to pattern;Shuffle;Trunk flexed Gait velocity: decreased Stairs: No Wheelchair Mobility Wheelchair Mobility: No    Exercises     PT Diagnosis:    PT Problem List:   PT Treatment Interventions:     PT Goals (current goals can now be found in the care  plan section) Acute Rehab PT Goals Patient Stated Goal: To go home tomorrow  Visit Information  Last PT Received On: 10/25/12 Assistance Needed: +1    Subjective Data  Subjective: "You don't rest much in the hospital do you?" Patient Stated Goal: To go home tomorrow   Cognition  Cognition Arousal/Alertness: Awake/alert Behavior During Therapy: WFL for tasks assessed/performed Overall Cognitive Status: Within Functional Limits for tasks assessed    Balance     End of Session PT - End of Session Equipment Utilized During Treatment: Gait belt Activity Tolerance: Patient tolerated treatment well Patient left: in bed;with call bell/phone within reach;in CPM Nurse Communication: Mobility status   GP     Kim Castro 10/25/2012, 1:46 PM Jake Shark, PT DPT [phone]

## 2012-10-25 NOTE — Progress Notes (Signed)
Patient ID: Kim Castro, female   DOB: 07-10-1945, 67 y.o.   MRN: [id number] PATIENT ID: Kim Castro  MRN: [id number]  DOB/AGE:  03/13/1946 / 67 y.o.  1 Day Post-Op Procedure(s) (LRB): TOTAL KNEE ARTHROPLASTY (Left)    PROGRESS NOTE Subjective: Patient is alert, oriented, no Nausea, no Vomiting, yes passing gas, no Bowel Movement. Taking PO sips. Denies SOB, Chest or Calf Pain. Using Incentive Spirometer, PAS in place. Ambulate WBAT today, CPM 0-60 Patient reports pain as 3 on 0-10 scale  .    Objective: Vital signs in last 24 hours: Filed Vitals:   10/24/12 1655 10/24/12 2047 10/25/12 0130 10/25/12 0647  BP:  116/50 130/59 126/63  Pulse: 89 88 68 67  Temp: 97.8 F (36.6 C) 98.5 F (36.9 C) 97.4 F (36.3 C) 98.1 F (36.7 C)  TempSrc:   Oral Oral  Resp: 16 18 18 18   SpO2: 93% 96% 97% 97%      Intake/Output from previous day: I/O last 3 completed shifts: In: 2630 [P.O.:480; I.V.:2150] Out: 2900 [Urine:2450; Drains:375; Blood:75]   Intake/Output this shift:     LABORATORY DATA:  Recent Labs  10/25/12 0455  WBC 14.8*  HGB 10.8*  HCT 33.1*  PLT 308  NA 139  K 4.1  CL 102  CO2 32  BUN 11  CREATININE 0.70  GLUCOSE 160*  CALCIUM 8.8    Examination: Neurologically intact ABD soft Neurovascular intact Sensation intact distally Intact pulses distally Dorsiflexion/Plantar flexion intact Incision: no drainage No cellulitis present Compartment soft} Blood and plasma separated in drain indicating minimal recent drainage, drain pulled without difficulty.  Assessment:   1 Day Post-Op Procedure(s) (LRB): TOTAL KNEE ARTHROPLASTY (Left) ADDITIONAL DIAGNOSIS:  Hypertension  Plan: PT/OT WBAT, CPM 5/hrs day until ROM 0-90 degrees, then D/C CPM DVT Prophylaxis:  SCDx72hrs, ASA 325 mg BID x 2 weeks DISCHARGE PLAN: Home, probably tomkorrow DISCHARGE NEEDS: HHPT, HHRN, CPM, Walker and 3-in-1 comode seat     Delinda Malan J 10/25/2012, 7:42 AM

## 2012-10-25 NOTE — Evaluation (Signed)
Physical Therapy Evaluation Patient Details Name: Kim Castro MRN: [id number] DOB: [birth year] Today's Date: 10/25/2012 Time: 7829-5621 PT Time Calculation (min): 25 min  PT Assessment / Plan / Recommendation History of Present Illness   s/p left TKA  Clinical Impression  Pt will benefit from acute PT services to improve overall mobility and prepare for safe d/c home with family    PT Assessment  Patient needs continued PT services    Follow Up Recommendations  Home health PT;Supervision/Assistance - 24 hour    Equipment Recommendations  Rolling walker with 5" wheels;3in1 (PT)    Frequency 7X/week    Precautions / Restrictions Precautions Precautions: Knee Restrictions Weight Bearing Restrictions: Yes LLE Weight Bearing: Weight bearing as tolerated   Pertinent Vitals/Pain 5/10 left knee      Mobility  Bed Mobility Bed Mobility: Sit to Supine Sit to Supine: 4: Min assist;With rail Details for Bed Mobility Assistance: (A) with left LE OOB with cues for technique and use of rail Transfers Transfers: Sit to Stand;Stand to Sit Sit to Stand: 4: Min assist;From bed Stand to Sit: 4: Min assist;To chair/3-in-1 Details for Transfer Assistance: (A) to initiate transfer with cues for hand placement and LE placement Ambulation/Gait Ambulation/Gait Assistance: 4: Min assist Ambulation Distance (Feet): 40 Feet Assistive device: Rolling walker Ambulation/Gait Assistance Details: (A) to manage RW and cues for proper step sequence and body position within RW. Gait Pattern: Step-to pattern;Shuffle;Trunk flexed Gait velocity: decreased Stairs: No Wheelchair Mobility Wheelchair Mobility: No    Exercises Total Joint Exercises Ankle Circles/Pumps: AAROM;Strengthening;Both;10 reps Quad Sets: AAROM;Strengthening;Left;5 reps Heel Slides: AAROM;Strengthening;Left;5 reps Goniometric ROM: 5-40   PT Diagnosis: Difficulty walking;Acute pain  PT Problem List: Decreased  strength;Decreased range of motion;Decreased activity tolerance;Decreased balance;Decreased mobility;Decreased knowledge of use of DME;Decreased knowledge of precautions;Pain PT Treatment Interventions: DME instruction;Gait training;Stair training;Functional mobility training;Therapeutic activities;Therapeutic exercise;Balance training;Patient/family education     PT Goals(Current goals can be found in the care plan section) Acute Rehab PT Goals Patient Stated Goal: To go home tomorrow PT Goal Formulation: With patient Potential to Achieve Goals: Good  Visit Information  Last PT Received On: 10/25/12 Assistance Needed: +1       Prior Functioning  Home Living Family/patient expects to be discharged to:: Private residence Living Arrangements: Spouse/significant other Available Help at Discharge: Family;Available 24 hours/day Type of Home: House Home Access: Stairs to enter [employer] of Steps: 2 Entrance Stairs-Rails: None Home Layout: Two level;Able to live on main level with bedroom/bathroom Home Equipment: None Prior Function Level of Independence: Independent Communication Communication: No difficulties Dominant Hand: Right    Cognition  Cognition Arousal/Alertness: Awake/alert Behavior During Therapy: WFL for tasks assessed/performed Overall Cognitive Status: Within Functional Limits for tasks assessed    Extremity/Trunk Assessment     Balance    End of Session PT - End of Session Equipment Utilized During Treatment: Gait belt Activity Tolerance: Patient tolerated treatment well Patient left: in chair;with call bell/phone within reach Nurse Communication: Mobility status CPM Left Knee CPM Left Knee: Off  GP     Nanea Jared 10/25/2012, 10:57 AM Jake Shark, PT DPT [phone]

## 2012-10-25 NOTE — Care Management Note (Signed)
    Page 1 of 2   10/25/2012     3:47:37 PM   CARE MANAGEMENT NOTE 10/25/2012  Patient:  Kim Castro, Kim Castro   Account Number:  [id number]  Date Initiated:  10/25/2012  Documentation initiated by:  [facility]  Subjective/Objective Assessment:   admitted postop left total knee     Action/Plan:   PT eval- recommended HHPT   Anticipated DC Date:  10/26/2012   Anticipated DC Plan:  HOME W HOME HEALTH SERVICES      DC Planning Services  CM consult      Choice offered to / List presented to:     DME arranged  3-N-1  WALKER - ROLLING  CPM      DME agency  [employer]     HH arranged  HH-1 RN  HH-2 PT  HH-3 OT      [facility] agency  [facility]   Status of service:  Completed, signed off Medicare Important Message given?   (If response is "NO", the following Medicare IM given date fields will be blank) Date Medicare IM given:   Date Additional Medicare IM given:    Discharge Disposition:  HOME W HOME HEALTH SERVICES  Per UR Regulation:    If discussed at Long Length of Stay Meetings, dates discussed:    Comments:  10/25/12 Patent was set up with [facility] for [facility], HHPT and HHOT by MD office. Spoke with patien and daughtert, no change to d/c plans. Contacted Shamara Soza at [place] and [facility] is set up. 3N1 and rolling walker delivered to patient's room by [employer]. Jacquelynn Cree RN, BSN, CCM

## 2012-10-25 NOTE — Progress Notes (Signed)
Orthopedic Tech Progress Note Patient Details:  Kim Castro 1946/02/07 [id number] Patient already in CPM at this time. Patient stated therapy place her in CPM after working with her.  Patient ID: Kim Castro, female   DOB: [birth year], 67 y.o.   MRN: [id number]   [name] 10/25/2012, 3:33 PM

## 2012-10-26 ENCOUNTER — Encounter (HOSPITAL_COMMUNITY): Payer: Self-pay | Admitting: Orthopedic Surgery

## 2012-10-26 LAB — CBC
HCT: 32.1 % — ABNORMAL LOW (ref 36.0–46.0)
Hemoglobin: 10.5 g/dL — ABNORMAL LOW (ref 12.0–15.0)
MCH: 32 pg (ref 26.0–34.0)
MCHC: 32.7 g/dL (ref 30.0–36.0)
MCV: 97.9 fL (ref 78.0–100.0)
RDW: 14.6 % (ref 11.5–15.5)

## 2012-10-26 NOTE — Progress Notes (Signed)
Physical Therapy Treatment Patient Details Name: Kim Castro MRN: [id number] DOB: 02-29-1946 Today's Date: 10/26/2012 Time: 6213-0865 PT Time Calculation (min): 46 min  PT Assessment / Plan / Recommendation  PT Comments   Pt quite symptomatic when up today, reports nausea and mild dizziness despite determination to 'do what it takes to get home.'  Unable to assess standing or seated BP, however at end of session pt returned supine HOB 30 and BP = 129/53.  Pt nauseated and sensitive to overhead light and to movement.  Able to practice small step this morning, however has to get into van to go home and to ascend 2 steps to enter home.  Discussed with patient risk of fall/fall related injury based on current status and concern for safety if attempt to d/c today.  Agreeable to pm session to assess status and tolerance of mobility activities, however I feel strongly pt will benefit from an additional day in acute setting to stabilize and demonstrate increased safety and independence with mobility sufficient to access home.  Do NOT recommend d/c today, and will reassess in pm.  Have discussed with RN as well.  Follow Up Recommendations  Home health PT;Supervision/Assistance - 24 hour     Does the patient have the potential to tolerate intense rehabilitation     Barriers to Discharge  2 STE, access van for transportation home.  Should address barriers with additional day of therapy/acute care.      Equipment Recommendations  Rolling walker with 5" wheels;3in1 (PT)    Recommendations for Other Services    Frequency 7X/week   Progress towards PT Goals Progress towards PT goals: Progressing toward goals  Plan Current plan remains appropriate    Precautions / Restrictions Precautions Precautions: Knee Restrictions Weight Bearing Restrictions: Yes LLE Weight Bearing: Weight bearing as tolerated   Pertinent Vitals/Pain Premedicated, moderate pain especially into sitting; hypotensive symptoms  limiting progress today.    Mobility  Bed Mobility Bed Mobility: Supine to Sit;Sit to Supine Supine to Sit: 3: Mod assist;HOB flat Sit to Supine: 3: Mod assist;HOB flat Details for Bed Mobility Assistance: encouragement, along with verbal cues and physical assist to transition to right EOB and to raise trunk, control left leg to complete transition.  Upon return to bed, assist to lift left leg onto Left EOB and to control speed and position of trunk.  Pt symptomatic from standing/sitting and BP check once back supine 129/52 reported to RN Transfers Transfers: Sit to Stand;Stand to Sit Sit to Stand: 5: Supervision;From elevated surface;With upper extremity assist;From bed;From chair/3-in-1;From toilet Stand to Sit: 4: Min assist;With upper extremity assist;Without upper extremity assist;With armrests;To bed;To chair/3-in-1;To toilet Details for Transfer Assistance: Upon sitting on toilet, pt safely postions in preparation, uses side rail appropriately and postions operated limb safely without cue.  Sitting to chair without armrests, req min assist to control speed of descent and reminder cues to position left leg.   Ambulation/Gait Ambulation/Gait Assistance: 4: Min guard Ambulation Distance (Feet): 20 Feet Assistive device: Rolling walker Ambulation/Gait Assistance Details: encouragement, along with verbal cues to reinforce gait pattern postoperatively.  Constant checks on subjective symptoms of hypotension, pt progressively more nauseated and 'off balance' unrelieved by cool drink of water or cool cloths to forehead/posterior neck, pt consenting to return to supine once nausea under control. Once repostioned supine, pt placed in [facility] 30 and RN contacted. See comments on session. Gait Pattern: Step-to pattern;Antalgic;Trunk flexed Gait velocity: distance covered too short to measure, but subjectively very slow.  Stairs: Yes Stair Management Technique: No rails;With walker;Forwards (used portable  platform step to practice at [employer]) Number of Stairs: 1 (repeated x2 trials) Naval architect Mobility: No    Exercises  Pt unable to participate in exercises due to symptoms   PT Diagnosis:    PT Problem List:   PT Treatment Interventions:     PT Goals (current goals can now be found in the care plan section) Acute Rehab PT Goals Patient Stated Goal: To go home tomorrow  Visit Information  Last PT Received On: 10/26/12 Assistance Needed: +1 History of Present Illness: s/p left TKA    Subjective Data  Subjective: I want to go home.  I can't sleep here. Patient Stated Goal: To go home tomorrow   Cognition  Cognition Arousal/Alertness: Awake/alert Behavior During Therapy: WFL for tasks assessed/performed Overall Cognitive Status: Within Functional Limits for tasks assessed    Balance     End of Session PT - End of Session Patient left: in bed;with call bell/phone within reach;with nursing/sitter in room Nurse Communication: Mobility status;Patient requests pain meds (request for nausea meds and recheck vitals)   GP     Dennis Bast 10/26/2012, 10:47 AM

## 2012-10-26 NOTE — Progress Notes (Addendum)
PATIENT ID: Kim Castro  MRN: [id number]  DOB/AGE:  January 02, 1946 / 67 y.o.  2 Days Post-Op Procedure(s) (LRB): TOTAL KNEE ARTHROPLASTY (Left)    PROGRESS NOTE Subjective: Patient is alert, oriented, no Nausea, no Vomiting, yes passing gas, no Bowel Movement. Taking PO well. Denies SOB, Chest or Calf Pain. Using Incentive Spirometer, PAS in place. Ambulating well with PT., CPM 0-60 Patient reports pain as moderate  .    Objective: Vital signs in last 24 hours: Filed Vitals:   10/25/12 2000 10/25/12 2157 10/26/12 0000 10/26/12 0527  BP:  135/49  109/40  Pulse:  75  71  Temp:  99 F (37.2 C)  98.1 F (36.7 C)  TempSrc:      Resp: 16 14 18 16   SpO2:  95%  97%      Intake/Output from previous day: I/O last 3 completed shifts: In: 980 [P.O.:480; I.V.:500] Out: 2350 [Urine:2100; Drains:250]   Intake/Output this shift:     LABORATORY DATA:  Recent Labs  10/25/12 0455 10/26/12 0430  WBC 14.8* 12.5*  HGB 10.8* 10.5*  HCT 33.1* 32.1*  PLT 308 262  NA 139  --   K 4.1  --   CL 102  --   CO2 32  --   BUN 11  --   CREATININE 0.70  --   GLUCOSE 160*  --   CALCIUM 8.8  --     Examination: Neurologically intact ABD soft Neurovascular intact Sensation intact distally Intact pulses distally Dorsiflexion/Plantar flexion intact Incision: dressing C/D/I}  Assessment:   2 Days Post-Op Procedure(s) (LRB): TOTAL KNEE ARTHROPLASTY (Left) ADDITIONAL DIAGNOSIS:  none  Plan: PT/OT WBAT, CPM 5/hrs day until ROM 0-90 degrees, then D/C CPM DVT Prophylaxis:  SCDx72hrs, ASA 325 mg BID x 2 weeks Dressing change today. Hold BP meds today.  Patient will resume Losartan and HCTZ in a few days. DISCHARGE PLAN: Home today DISCHARGE NEEDS: HHPT, HHRN, CPM, Walker and 3-in-1 comode seat     Lateshia Schmoker M. 10/26/2012, 7:13 AM

## 2012-10-26 NOTE — Progress Notes (Signed)
OT Cancellation Note  Patient Details Name: Kim Castro MRN: [id number] DOB: Feb 26, 1946   Cancelled Treatment:    Reason Eval/Treat Not Completed: Medical issues which prohibited therapy.  Pt with nausea and dizziness.  Will continue to follow.  Evern Bio 10/26/2012, 1:22 PM

## 2012-10-26 NOTE — Progress Notes (Signed)
Pt has been feeling lightheaded and nauseated off and on today.  She was able to participate better in the second session of physical therapy, but still felt lightheaded.  Her appetite has been poor and with taking oxycodone, she has not felt well.  Notified Shirl Harris, [place], who felt it best for patient to stay another night.  Nsg to continue to monitor.

## 2012-10-26 NOTE — Progress Notes (Signed)
Physical Therapy Treatment Patient Details Name: Kim Castro MRN: [id number] DOB: [birth year] Today's Date: 10/26/2012 Time: 4098-1191 PT Time Calculation (min): 26 min  PT Assessment / Plan / Recommendation  PT Comments   Pt able to complete stair negotiation however c/o nausea after stairs and increase in pain in LLE.  Pt could not remember the last time pt had taken pain medication and per RN it was early AM.  Pt lightheaded during ambulation and increased after stairs.  Noticeable tremors in hands when attempting to drink water.  Pt with very little to eat during the day and highly encouraged pt to eat prior to taking pain medication.  For pt safety recommended pt to stay another night for pain control and further therapy prior to d/c home with dtrs.  RN and dtrs in agreement with plan.    Follow Up Recommendations  Home health PT;Supervision/Assistance - 24 hour     Equipment Recommendations  Rolling walker with 5" wheels;3in1 (PT)    Frequency 7X/week   Plan Current plan remains appropriate    Precautions / Restrictions Precautions Precautions: Knee Restrictions Weight Bearing Restrictions: Yes LLE Weight Bearing: Weight bearing as tolerated   Pertinent Vitals/Pain No c/o pain prior to mobility however increases to 9/10 LLE with weightbearing    Mobility  Bed Mobility Bed Mobility: Supine to Sit;Sit to Supine Supine to Sit: 4: Min assist;With rails Details for Bed Mobility Assistance: (A) to elevate trunk OOB and (A) with LLE OOB with cues for proper technique Transfers Transfers: Sit to Stand;Stand to Sit Sit to Stand: 5: Supervision;From bed Stand to Sit: 4: Min guard;To chair/3-in-1 Details for Transfer Assistance: Minguard for safety with max cues for hand placement.  Pt continues to want to keep hands on RW during transfers. Ambulation/Gait Ambulation/Gait Assistance: 4: Min guard Ambulation Distance (Feet): 20 Feet Assistive device: Rolling  walker Ambulation/Gait Assistance Details: Minguard for safety with cues for upright posture.  Pt reported being lightheaded however no nausea during ambulation. Gait Pattern: Step-to pattern;Antalgic;Trunk flexed Stairs: Yes Stairs Assistance: 4: Min assist Stairs Assistance Details (indicate cue type and reason): (A) to manage RW and max cues for proper step sequence Stair Management Technique: Backwards;With walker Number of Stairs: 2 Wheelchair Mobility Wheelchair Mobility: No    Exercises     PT Diagnosis:    PT Problem List:   PT Treatment Interventions:     PT Goals (current goals can now be found in the care plan section) Acute Rehab PT Goals Patient Stated Goal: To go home tomorrow  Visit Information  Last PT Received On: 10/26/12 Assistance Needed: +1 History of Present Illness: s/p left TKA    Subjective Data  Subjective: "I really want to go home today.  At least try the steps." Patient Stated Goal: To go home tomorrow   Cognition  Cognition Arousal/Alertness: Awake/alert Behavior During Therapy: WFL for tasks assessed/performed Overall Cognitive Status: Within Functional Limits for tasks assessed    Balance     End of Session PT - End of Session Equipment Utilized During Treatment: Gait belt Activity Tolerance: Patient limited by fatigue (c/o nausea after stairs) Patient left: in chair;with call bell/phone within reach;with family/visitor present;with nursing/sitter in room Nurse Communication: Mobility status;Other (comment) (discuss with RN about pt staying)   GP     Malayia Spizzirri 10/26/2012, 5:25 PM Lewis, PT DPT [phone]

## 2012-10-26 NOTE — Discharge Summary (Signed)
Patient ID: Kim Castro MRN: [id number] DOB/AGE: [birth year] 67 y.o.  Admit date: 10/24/2012 Discharge date: 10/26/2012  Admission Diagnoses:  Principal Problem:   Osteoarthritis of left knee   Discharge Diagnoses:  Same  Past Medical History  Diagnosis Date  . Hypertension   . GERD (gastroesophageal reflux disease)   . Arthritis     Surgeries: Procedure(s): TOTAL KNEE ARTHROPLASTY on 10/24/2012   Consultants:    Discharged Condition: Improved  Hospital Course: Kim Castro is an 67 y.o. female who was admitted 10/24/2012 for operative treatment ofOsteoarthritis of left knee. Patient has severe unremitting pain that affects sleep, daily activities, and work/hobbies. After pre-op clearance the patient was taken to the operating room on 10/24/2012 and underwent  Procedure(s): TOTAL KNEE ARTHROPLASTY.    Patient was given perioperative antibiotics: Anti-infectives   Start     Dose/Rate Route Frequency Ordered Stop   10/24/12 1319  cefUROXime (ZINACEF) injection  Status:  Discontinued       As needed 10/24/12 1320 10/24/12 1451   10/24/12 0600  ceFAZolin (ANCEF) 3 g in dextrose 5 % 50 mL IVPB     3 g 160 mL/hr over 30 Minutes Intravenous On call to O.R. 10/23/12 1432 10/24/12 1258       Patient was given sequential compression devices, early ambulation, and chemoprophylaxis to prevent DVT.  Patient benefited maximally from hospital stay and there were no complications.    Recent vital signs: Patient Vitals for the past 24 hrs:  BP Temp Pulse Resp SpO2  10/26/12 0527 109/40 mmHg 98.1 F (36.7 C) 71 16 97 %  10/26/12 0000 - - - 18 -  10/25/12 2157 135/49 mmHg 99 F (37.2 C) 75 14 95 %  10/25/12 2000 - - - 16 -  10/25/12 1600 - - - 16 96 %  10/25/12 1300 133/54 mmHg 97.8 F (36.6 C) 83 18 97 %  10/25/12 1200 - - - 18 96 %  10/25/12 0800 - - - 20 97 %     Recent laboratory studies:  Recent Labs  10/25/12 0455 10/26/12 0430  WBC 14.8* 12.5*  HGB 10.8* 10.5*   HCT 33.1* 32.1*  PLT 308 262  NA 139  --   K 4.1  --   CL 102  --   CO2 32  --   BUN 11  --   CREATININE 0.70  --   GLUCOSE 160*  --   CALCIUM 8.8  --      Discharge Medications:     Medication List    STOP taking these medications       ibuprofen 200 MG tablet  Commonly known as:  ADVIL,MOTRIN      TAKE these medications       aspirin EC 325 MG tablet  Take 1 tablet (325 mg total) by mouth 2 (two) times daily.     hydrochlorothiazide 25 MG tablet  Commonly known as:  HYDRODIURIL  Take 25 mg by mouth daily.     losartan 25 MG tablet  Commonly known as:  COZAAR  Take 25 mg by mouth daily.     methocarbamol 500 MG tablet  Commonly known as:  ROBAXIN  Take 1 tablet (500 mg total) by mouth 4 (four) times daily.     multivitamin with minerals tablet  Take 1 tablet by mouth daily.     oxyCODONE-acetaminophen 5-325 MG per tablet  Commonly known as:  ROXICET  Take 1-2 tablets by mouth every 4 (four) hours  as needed for pain.     RED YEAST RICE PO  Take 2 capsules by mouth daily.     Vitamin D 2000 UNITS Caps  Take 1 capsule by mouth daily.        Diagnostic Studies: Dg Chest 2 View  10/18/2012   *RADIOLOGY REPORT*  Clinical Data: Preop knee replacement  CHEST - 2 VIEW  Comparison: None.  Findings: Heart size is upper normal.  Negative for heart failure. Lungs are free of infiltrate or mass.  Thoracic osteophytes are noted.  IMPRESSION: No active cardiopulmonary abnormality.   Original Report Authenticated By: Janeece Riggers, M.D.    Disposition: Final discharge disposition not confirmed      Discharge Orders   Future Orders Complete By Expires     Call MD for:  redness, tenderness, or signs of infection (pain, swelling, redness, odor or green/yellow discharge around incision site)  As directed     Call MD for:  severe uncontrolled pain  As directed     Call MD for:  temperature >100.4  As directed     Change dressing (specify)  As directed     Comments:       Dressing change as needed.    Discharge instructions  As directed     Comments:      Follow up with Dr. Turner Daniels as scheduled.    Driving Restrictions  As directed     Comments:      No driving for 2 weeks.    Increase activity slowly  As directed     May shower / Bathe  As directed     Walker   As directed           Signed: Gala Padovano M. 10/26/2012, 7:17 AM

## 2012-10-27 LAB — CBC
MCH: 32.6 pg (ref 26.0–34.0)
MCHC: 33 g/dL (ref 30.0–36.0)
MCV: 98.7 fL (ref 78.0–100.0)
Platelets: 264 10*3/uL (ref 150–400)
RBC: 3.13 MIL/uL — ABNORMAL LOW (ref 3.87–5.11)
RDW: 14.7 % (ref 11.5–15.5)

## 2012-10-27 NOTE — Evaluation (Signed)
Occupational Therapy Evaluation Patient Details Name: Kim Castro MRN: [id number] DOB: [birth year] Today's Date: 10/27/2012 Time: 7829-5621 OT Time Calculation (min): 23 min  OT Assessment / Plan / Recommendation History of present illness 67 y.o. female admitted to [facility] for elective R TKA.  She is WBAT with no knee brace.    Clinical Impression   Pt for planned discharge home today.  All education completed.  She is able to perform LB ADLs and functional transfers with min guard asssit.  She will have 24 hour assist.  No further OT needed, all education completed.     OT Assessment  Patient does not need any further OT services    Follow Up Recommendations  No OT follow up;Supervision - Intermittent    Barriers to Discharge      Equipment Recommendations  None recommended by OT    Recommendations for Other Services    Frequency       Precautions / Restrictions Precautions Precautions: Knee Precaution Booklet Issued: Yes (comment) (exercise handout) Precaution Comments: reviewed knee precautions Restrictions LLE Weight Bearing: Weight bearing as tolerated       ADL  Eating/Feeding: Independent Where Assessed - Eating/Feeding: Chair Lower Body Bathing: Minimal assistance Where Assessed - Lower Body Bathing: Supported sit to stand Lower Body Dressing: Min guard Where Assessed - Lower Body Dressing: Supported sit to Pharmacist, hospital: Hydrographic surveyor Method: Sit to Barista: Raised toilet seat with arms (or 3-in-1 over toilet) Toileting - Clothing Manipulation and Hygiene: Min guard Where Assessed - Engineer, mining and Hygiene: Standing Tub/Shower Transfer: Insurance risk surveyor Method: Science writer:  (sitting on edge of garden tub and swinging legs in) Equipment Used: Rolling walker Transfers/Ambulation Related to ADLs: min guard assist ADL Comments: Pt able to access feet to  don/doff pants.   Encouraged pt to perform at home vs. having assist.  Pt reports she has a garden tub and would like to sit on the edge and swing legs in to tub then stand to shower.  Simulated this transfer and she was able to perform with min guard assist.      OT Diagnosis:    OT Problem List:   OT Treatment Interventions:     OT Goals(Current goals can be found in the care plan section) Acute Rehab OT Goals Patient Stated Goal: to get stronger so she can have her other knee done.    Visit Information  Last OT Received On: 10/27/12 Assistance Needed: +1 History of Present Illness: 67 y.o. female admitted to [facility] for elective R TKA.  She is WBAT with no knee brace.        Prior Functioning     Home Living Family/patient expects to be discharged to:: Private residence Living Arrangements: Spouse/significant other Available Help at Discharge: Family;Available 24 hours/day Type of Home: House Home Access: Stairs to enter [employer] of Steps: 2 Entrance Stairs-Rails: None Home Layout: Two level;Able to live on main level with bedroom/bathroom Home Equipment: None;Bedside commode Prior Function Level of Independence: Independent Communication Communication: No difficulties Dominant Hand: Right         Vision/Perception     Cognition  Cognition Arousal/Alertness: Awake/Castro Behavior During Therapy: WFL for tasks assessed/performed Overall Cognitive Status: Within Functional Limits for tasks assessed    Extremity/Trunk Assessment Upper Extremity Assessment Upper Extremity Assessment: Overall WFL for tasks assessed     Mobility Bed Mobility Bed Mobility: Not assessed Supine to Sit: 4: Min  assist;With rails;HOB elevated Details for Bed Mobility Assistance: Min assist to progress right leg out of bed.   Transfers Transfers: Sit to Stand;Stand to Sit Sit to Stand: 4: Min guard;With upper extremity assist;From chair/3-in-1 Stand to Sit: 4: Min guard;With  upper extremity assist;To chair/3-in-1 Details for Transfer Assistance: min guard assist to steady for balance while getting up especially from lower surface.       Exercise Total Joint Exercises Ankle Circles/Pumps: AROM;Both;20 reps;Supine Quad Sets: AROM;Left;10 reps;Supine Towel Squeeze: AROM;Both;10 reps;Supine Short Arc Quad: AAROM;Left;10 reps;Supine Heel Slides: AAROM;Left;10 reps;Supine Hip ABduction/ADduction: AAROM;Left;10 reps;Supine Straight Leg Raises: AAROM;Left;10 reps;Supine   Balance     End of Session OT - End of Session Equipment Utilized During Treatment: Rolling walker Activity Tolerance: Patient tolerated treatment well Patient left: in chair;with call bell/phone within reach;with family/visitor present  GO     Kim Castro, Kim Castro 10/27/2012, 3:41 PM

## 2012-10-27 NOTE — Progress Notes (Signed)
Physical Therapy Treatment Patient Details Name: Kim Castro MRN: [id number] DOB: 1945/10/10 Today's Date: 10/27/2012 Time: 4098-1191 PT Time Calculation (min): 42 min  PT Assessment / Plan / Recommendation  PT Comments   Pt is progressing nicely with mobility today.  No nausea or vomiting.  She will have the necessary assist needed to d/c home today.    Follow Up Recommendations  Home health PT;Supervision/Assistance - 24 hour           Equipment Recommendations  Rolling walker with 5" wheels;3in1 (PT)       Frequency 7X/week   Progress towards PT Goals Progress towards PT goals: Progressing toward goals  Plan Current plan remains appropriate    Precautions / Restrictions Precautions Precautions: Knee Precaution Booklet Issued: Yes (comment) (exercise handout) Precaution Comments: reviewed knee precautions Restrictions LLE Weight Bearing: Weight bearing as tolerated   Pertinent Vitals/Pain See vitals flow sheet.    Mobility  Bed Mobility Supine to Sit: 4: Min assist;With rails;HOB elevated Details for Bed Mobility Assistance: Min assist to progress right leg out of bed.   Transfers Sit to Stand: 4: Min guard;From elevated surface;With upper extremity assist;With armrests;From bed Stand to Sit: 4: Min guard;With upper extremity assist;With armrests;To chair/3-in-1 Details for Transfer Assistance: min guard assist to steady for balance while getting up especially from lower surface.   Ambulation/Gait Ambulation/Gait Assistance: 5: Supervision Ambulation Distance (Feet): 150 Feet Assistive device: Rolling walker Ambulation/Gait Assistance Details: supervision for safety, good technique, upright posture.  Pt at the end of gait was getting fatigued and "hot" Gait Pattern: Step-to pattern;Antalgic Stairs Assistance: 4: Min assist Stairs Assistance Details (indicate cue type and reason): min assist to stabilize RW Stair Management Technique: Backwards;With  walker Number of Stairs: 2    Exercises Total Joint Exercises Ankle Circles/Pumps: AROM;Both;20 reps;Supine Quad Sets: AROM;Left;10 reps;Supine Towel Squeeze: AROM;Both;10 reps;Supine Short Arc Quad: AAROM;Left;10 reps;Supine Heel Slides: AAROM;Left;10 reps;Supine Hip ABduction/ADduction: AAROM;Left;10 reps;Supine Straight Leg Raises: AAROM;Left;10 reps;Supine   PT Goals (current goals can now be found in the care plan section) Acute Rehab PT Goals Patient Stated Goal: to get stronger so she can have her other knee done.    Visit Information  Last PT Received On: 10/27/12 Assistance Needed: +1 History of Present Illness: 67 y.o. female admitted to [facility] for elective R TKA.  She is WBAT with no knee brace.     Subjective Data  Subjective: Pt wants to go home to her own bed Patient Stated Goal: to get stronger so she can have her other knee done.     Cognition  Cognition Arousal/Alertness: Awake/alert Behavior During Therapy: WFL for tasks assessed/performed Overall Cognitive Status: Within Functional Limits for tasks assessed       End of Session PT - End of Session Equipment Utilized During Treatment: Gait belt Activity Tolerance: Patient limited by fatigue Patient left: in chair;with call bell/phone within reach     Sabula B. Ytzel Gubler, PT, DPT [phone]   10/27/2012, 2:22 PM

## 2012-10-27 NOTE — Progress Notes (Signed)
PATIENT ID: Kim Castro  MRN: [id number]  DOB/AGE:  [birth year] / 67 y.o.  3 Days Post-Op Procedure(s) (LRB): TOTAL KNEE ARTHROPLASTY (Left)    PROGRESS NOTE Subjective: Patient reports feeling better and desires to go home today.  + flatus, no BM    Objective: Vital signs in last 24 hours: Filed Vitals:   10/26/12 2152 10/27/12 0000 10/27/12 0400 10/27/12 0525  BP: 125/55   129/46  Pulse: 81   77  Temp: 98.8 F (37.1 C)   98.7 F (37.1 C)  TempSrc:      Resp: 18 20 20 18   Height:      Weight:      SpO2: 96%   98%      Intake/Output from previous day: I/O last 3 completed shifts: In: 360 [P.O.:360] Out: -    Intake/Output this shift: Total I/O In: 300 [P.O.:300] Out: 0    LABORATORY DATA:  Recent Labs  10/25/12 0455 10/26/12 0430 10/27/12 0415  WBC 14.8* 12.5* 10.7*  HGB 10.8* 10.5* 10.2*  HCT 33.1* 32.1* 30.9*  PLT 308 262 264  NA 139  --   --   K 4.1  --   --   CL 102  --   --   CO2 32  --   --   BUN 11  --   --   CREATININE 0.70  --   --   GLUCOSE 160*  --   --   CALCIUM 8.8  --   --     Examination: Neurologically intact ABD soft Neurovascular intact Sensation intact distally Intact pulses distally Dorsiflexion/Plantar flexion intact Incision: dressing C/D/I}  Assessment:   3 Days Post-Op Procedure(s) (LRB): TOTAL KNEE ARTHROPLASTY (Left) ADDITIONAL DIAGNOSIS:  none  Plan: PT/OT WBAT, CPM 5/hrs day until ROM 0-90 degrees, then D/C CPM DVT Prophylaxis:  SCDx72hrs, ASA 325 mg BID x 2 weeks DISCHARGE PLAN: Home today DISCHARGE NEEDS: HHPT, HHRN, CPM, Walker and 3-in-1 comode seat     Kim Castro A. 10/27/2012, 6:18 AM

## 2012-12-26 ENCOUNTER — Other Ambulatory Visit: Payer: Self-pay | Admitting: Orthopedic Surgery

## 2013-01-03 ENCOUNTER — Encounter (HOSPITAL_COMMUNITY): Payer: Self-pay | Admitting: Pharmacy Technician

## 2013-01-03 NOTE — Pre-Procedure Instructions (Signed)
Kim Castro  01/03/2013   Your procedure is scheduled on:  Wed, Sept 10 @ 12:45 PM  Report to [facility] at 10:45 AM.  Call this number if you have problems the morning of surgery: [phone]   Remember:   Do not eat food or drink liquids after midnight.                 Stop taking your Aspirin and Red Yeast Rice.No Goody's,BC's,Ibuprofen,Aleve,Fish Oil,or any Herbal Medications   Do not wear jewelry, make-up or nail polish.  Do not wear lotions, powders, or perfumes. You may wear deodorant.  Do not shave 48 hours prior to surgery.   Do not bring valuables to the hospital.  [facility] is not responsible                   for any belongings or valuables.  Contacts, dentures or bridgework may not be worn into surgery.  Leave suitcase in the car. After surgery it may be brought to your room.  For patients admitted to the hospital, checkout time is 11:00 AM the day of  discharge.     Special Instructions: Shower using CHG 2 nights before surgery and the night before surgery.  If you shower the day of surgery use CHG.  Use special wash - you have one bottle of CHG for all showers.  You should use approximately 1/3 of the bottle for each shower.   Please read over the following fact sheets that you were given: Pain Booklet, Coughing and Deep Breathing, Blood Transfusion Information, Total Joint Packet, MRSA Information and Surgical Site Infection Prevention

## 2013-01-04 ENCOUNTER — Encounter (HOSPITAL_COMMUNITY): Payer: Self-pay

## 2013-01-04 ENCOUNTER — Encounter (HOSPITAL_COMMUNITY)
Admission: RE | Admit: 2013-01-04 | Discharge: 2013-01-04 | Disposition: A | Discharge status: Home / Self Care | Payer: BC Managed Care – PPO | Source: Ambulatory Visit | Attending: Orthopedic Surgery | Admitting: Orthopedic Surgery

## 2013-01-04 DIAGNOSIS — Z01818 Encounter for other preprocedural examination: Secondary | ICD-10-CM | POA: Insufficient documentation

## 2013-01-04 DIAGNOSIS — Z01812 Encounter for preprocedural laboratory examination: Secondary | ICD-10-CM | POA: Insufficient documentation

## 2013-01-04 HISTORY — DX: Vitamin D deficiency, unspecified: E55.9

## 2013-01-04 HISTORY — DX: Family history of other specified conditions: Z84.89

## 2013-01-04 HISTORY — DX: Frequency of micturition: R35.0

## 2013-01-04 HISTORY — DX: Unspecified cataract: H26.9

## 2013-01-04 HISTORY — DX: Diverticulosis of intestine, part unspecified, without perforation or abscess without bleeding: K57.90

## 2013-01-04 HISTORY — DX: Personal history of other diseases of the respiratory system: Z87.09

## 2013-01-04 HISTORY — DX: Personal history of colonic polyps: Z86.010

## 2013-01-04 HISTORY — DX: Pain in unspecified joint: M25.50

## 2013-01-04 HISTORY — DX: Personal history of colon polyps, unspecified: Z86.0100

## 2013-01-04 HISTORY — DX: Other seasonal allergic rhinitis: J30.2

## 2013-01-04 HISTORY — DX: Anesthesia of skin: R20.0

## 2013-01-04 HISTORY — DX: Dizziness and giddiness: R42

## 2013-01-04 LAB — BASIC METABOLIC PANEL
CO2: 29 mEq/L (ref 19–32)
Calcium: 10.2 mg/dL (ref 8.4–10.5)
Creatinine, Ser: 0.62 mg/dL (ref 0.50–1.10)
GFR calc non Af Amer: >90 mL/min (ref >90)
Sodium: 141 mEq/L (ref 135–145)

## 2013-01-04 LAB — APTT: aPTT: 24 seconds (ref 24–37)

## 2013-01-04 LAB — CBC WITH DIFFERENTIAL/PLATELET
Basophils Absolute: 0.1 10*3/uL (ref 0.0–0.1)
Eosinophils Absolute: 0.3 10*3/uL (ref 0.0–0.7)
Eosinophils Relative: 4 % (ref 0–5)
Lymphocytes Relative: 38 % (ref 12–46)
MCV: 94.6 fL (ref 78.0–100.0)
Neutrophils Relative %: 52 % (ref 43–77)
Platelets: 332 10*3/uL (ref 150–400)
RDW: 15 % (ref 11.5–15.5)
WBC: 9.2 10*3/uL (ref 4.0–10.5)

## 2013-01-04 LAB — SURGICAL PCR SCREEN
MRSA, PCR: NEGATIVE
Staphylococcus aureus: NEGATIVE

## 2013-01-04 LAB — PROTIME-INR
INR: 0.93 (ref 0.00–1.49)
Prothrombin Time: 12.3 seconds (ref 11.6–15.2)

## 2013-01-04 LAB — TYPE AND SCREEN: Antibody Screen: NEGATIVE

## 2013-01-04 MED ORDER — CHLORHEXIDINE GLUCONATE 4 % EX LIQD: 60.0000 mL | Freq: Once | CUTANEOUS | Status: DC

## 2013-01-04 NOTE — Progress Notes (Addendum)
  Pt doesn't have a cardiologist  Denies ever having an echo/stress test/heart cath   EKG and CXR in epic from 10-18-12  Medical Md is Dr.Kevin Little

## 2013-01-07 NOTE — H&P (Signed)
TOTAL KNEE ADMISSION H&P  Patient is being admitted for right total knee arthroplasty.  Subjective:  Chief Complaint:right knee pain.  HPI: Kim Castro, 67 y.o. female, has a history of pain and functional disability in the right knee due to arthritis and has failed non-surgical conservative treatments for greater than 12 weeks to includeNSAID's and/or analgesics, flexibility and strengthening excercises and activity modification.  Onset of symptoms was gradual, starting 5 years ago with gradually worsening course since that time. The patient noted no past surgery on the right knee(s).  Patient currently rates pain in the right knee(s) at severe with activity. Patient has night pain, worsening of pain with activity and weight bearing, pain that interferes with activities of daily living and pain with passive range of motion.  Patient has evidence of subchondral sclerosis, joint subluxation and joint space narrowing by imaging studies.  There is no active infection.  Patient Active Problem List   Diagnosis Date Noted  . Osteoarthritis of left knee 10/24/2012   Past Medical History  Diagnosis Date  . Arthritis   . Hypertension     takes Losartan daily and HCTZ  . Family history of anesthesia complication     sister slow to wake up with anesthesia  . History of bronchitis     > 75yrs ago  . Seasonal allergies     takes Claritin prn  . Dizziness     > 28yrs ago;took Antivert   . Numbness     to toes on each foot  . Joint pain   . Vitamin D deficiency     takes VIt D daily  . GERD (gastroesophageal reflux disease)     takes occasional TUMs  . History of colon polyps   . Diverticulosis   . Urinary frequency   . Cataract     immature on the left    Past Surgical History  Procedure Laterality Date  . Tubal ligation    . Total knee arthroplasty Left 10/24/2012    Procedure: TOTAL KNEE ARTHROPLASTY;  Surgeon: Nestor Lewandowsky, MD;  Location: [facility] OR;  Service: Orthopedics;  Laterality:  Left;  . Colonoscopy    . Cataract extraction Right     No prescriptions prior to admission   Allergies  Allergen Reactions  . Oxycodone Nausea And Vomiting    History  Substance Use Topics  . Smoking status: Never Smoker   . Smokeless tobacco: Not on file  . Alcohol Use: No    No family history on file.   Review of Systems  Constitutional: Negative.   HENT: Negative.   Eyes: Negative.   Respiratory: Negative.   Cardiovascular: Negative.   Gastrointestinal: Negative.   Musculoskeletal: Positive for joint pain (right knee).  Skin: Negative.   Neurological: Negative.   Endo/Heme/Allergies: Negative.   Psychiatric/Behavioral: Negative.     Objective:  Physical Exam  Constitutional: She is oriented to person, place, and time. She appears well-developed and well-nourished.  HENT:  Head: Normocephalic and atraumatic.  Eyes: Pupils are equal, round, and reactive to light.  Neck: Normal range of motion. Neck supple.  Cardiovascular: Intact distal pulses.   Respiratory: Effort normal and breath sounds normal.  Musculoskeletal: She exhibits tenderness (right knee).  Neurological: She is alert and oriented to person, place, and time. She has normal reflexes.  Skin: Skin is warm and dry.  Psychiatric: She has a normal mood and affect. Her behavior is normal. Judgment and thought content normal.    Vital signs in  last 24 hours:    Labs:   Estimated body mass index is 41.80 kg/(m^2) as calculated from the following:   Height as of 10/25/12: 5\' 5"  (1.651 m).   Weight as of 10/18/12: 113.944 kg (251 lb 3.2 oz).   Imaging Review Plain radiographs demonstrate severe degenerative joint disease of the right knee(s). The overall alignment isneutral. The bone quality appears to be good for age and reported activity level.  Assessment/Plan:  End stage arthritis, right knee   The patient history, physical examination, clinical judgment of the provider and imaging studies are  consistent with end stage degenerative joint disease of the right knee(s) and total knee arthroplasty is deemed medically necessary. The treatment options including medical management, injection therapy arthroscopy and arthroplasty were discussed at length. The risks and benefits of total knee arthroplasty were presented and reviewed. The risks due to aseptic loosening, infection, stiffness, patella tracking problems, thromboembolic complications and other imponderables were discussed. The patient acknowledged the explanation, agreed to proceed with the plan and consent was signed. Patient is being admitted for inpatient treatment for surgery, pain control, PT, OT, prophylactic antibiotics, VTE prophylaxis, progressive ambulation and ADL's and discharge planning. The patient is planning to be discharged to skilled nursing facility

## 2013-01-08 MED ORDER — CEFAZOLIN SODIUM-DEXTROSE 2-3 GM-% IV SOLR
2.0000 g | INTRAVENOUS | Status: AC
  Administered 2013-01-09: 2 g via INTRAVENOUS
  Filled 2013-01-08: qty 50

## 2013-01-09 ENCOUNTER — Encounter (HOSPITAL_COMMUNITY)
Admission: RE | Disposition: A | Discharge status: Home Health | Payer: Self-pay | Source: Ambulatory Visit | Attending: Orthopedic Surgery

## 2013-01-09 ENCOUNTER — Encounter (HOSPITAL_COMMUNITY): Payer: Self-pay | Admitting: Anesthesiology

## 2013-01-09 ENCOUNTER — Inpatient Hospital Stay (HOSPITAL_COMMUNITY): Payer: BC Managed Care – PPO | Admitting: Anesthesiology

## 2013-01-09 ENCOUNTER — Inpatient Hospital Stay (HOSPITAL_COMMUNITY)
Admission: RE | Admit: 2013-01-09 | Discharge: 2013-01-12 | DRG: 209 | Disposition: A | Discharge status: Home Health | Payer: BC Managed Care – PPO | Source: Ambulatory Visit | Attending: Orthopedic Surgery | Admitting: Orthopedic Surgery

## 2013-01-09 DIAGNOSIS — M1710 Unilateral primary osteoarthritis, unspecified knee: Principal | ICD-10-CM | POA: Diagnosis present

## 2013-01-09 DIAGNOSIS — R110 Nausea: Secondary | ICD-10-CM | POA: Diagnosis not present

## 2013-01-09 DIAGNOSIS — K219 Gastro-esophageal reflux disease without esophagitis: Secondary | ICD-10-CM | POA: Diagnosis present

## 2013-01-09 DIAGNOSIS — Z860100 Personal history of colon polyps, unspecified: Secondary | ICD-10-CM

## 2013-01-09 DIAGNOSIS — K5730 Diverticulosis of large intestine without perforation or abscess without bleeding: Secondary | ICD-10-CM | POA: Diagnosis present

## 2013-01-09 DIAGNOSIS — Z79899 Other long term (current) drug therapy: Secondary | ICD-10-CM

## 2013-01-09 DIAGNOSIS — M1711 Unilateral primary osteoarthritis, right knee: Secondary | ICD-10-CM

## 2013-01-09 DIAGNOSIS — Z9849 Cataract extraction status, unspecified eye: Secondary | ICD-10-CM

## 2013-01-09 DIAGNOSIS — Z9851 Tubal ligation status: Secondary | ICD-10-CM

## 2013-01-09 DIAGNOSIS — Z7982 Long term (current) use of aspirin: Secondary | ICD-10-CM

## 2013-01-09 DIAGNOSIS — E559 Vitamin D deficiency, unspecified: Secondary | ICD-10-CM | POA: Diagnosis present

## 2013-01-09 DIAGNOSIS — I10 Essential (primary) hypertension: Secondary | ICD-10-CM | POA: Diagnosis present

## 2013-01-09 DIAGNOSIS — Z96659 Presence of unspecified artificial knee joint: Secondary | ICD-10-CM

## 2013-01-09 HISTORY — PX: TOTAL KNEE ARTHROPLASTY: SHX125

## 2013-01-09 LAB — URINALYSIS, ROUTINE W REFLEX MICROSCOPIC
Bilirubin Urine: NEGATIVE
Hgb urine dipstick: NEGATIVE
Specific Gravity, Urine: 1.014 (ref 1.005–1.030)
pH: 6.5 (ref 5.0–8.0)

## 2013-01-09 SURGERY — ARTHROPLASTY, KNEE, TOTAL
Anesthesia: Regional | Site: Knee | Laterality: Right | Wound class: Clean

## 2013-01-09 MED ORDER — MENTHOL 3 MG MT LOZG: 1.0000 | LOZENGE | OROMUCOSAL | Status: DC | PRN

## 2013-01-09 MED ORDER — LOSARTAN POTASSIUM 25 MG PO TABS
25.0000 mg | ORAL_TABLET | Freq: Every day | ORAL | Status: DC
  Administered 2013-01-10 – 2013-01-12 (×3): 25 mg via ORAL
  Filled 2013-01-09 (×4): qty 1

## 2013-01-09 MED ORDER — SENNOSIDES-DOCUSATE SODIUM 8.6-50 MG PO TABS
1.0000 | ORAL_TABLET | Freq: Every evening | ORAL | Status: DC | PRN
  Administered 2013-01-11: 1 via ORAL

## 2013-01-09 MED ORDER — ONDANSETRON HCL 4 MG/2ML IJ SOLN
4.0000 mg | Freq: Four times a day (QID) | INTRAMUSCULAR | Status: DC | PRN
  Administered 2013-01-09 – 2013-01-10 (×2): 4 mg via INTRAVENOUS
  Filled 2013-01-09: qty 2

## 2013-01-09 MED ORDER — TRANEXAMIC ACID 100 MG/ML IV SOLN
1000.0000 mg | INTRAVENOUS | Status: AC
  Administered 2013-01-09: 1000 mg via INTRAVENOUS
  Filled 2013-01-09: qty 10

## 2013-01-09 MED ORDER — MIDAZOLAM HCL 2 MG/2ML IJ SOLN: 1.0000 mg | INTRAMUSCULAR | Status: DC | PRN

## 2013-01-09 MED ORDER — HYDROMORPHONE HCL PF 1 MG/ML IJ SOLN
INTRAMUSCULAR | Status: AC
  Filled 2013-01-09: qty 1

## 2013-01-09 MED ORDER — ASPIRIN EC 325 MG PO TBEC: 325.0000 mg | DELAYED_RELEASE_TABLET | Freq: Two times a day (BID) | ORAL | Status: DC

## 2013-01-09 MED ORDER — ONDANSETRON HCL 4 MG/2ML IJ SOLN
INTRAMUSCULAR | Status: AC
  Filled 2013-01-09: qty 2

## 2013-01-09 MED ORDER — GLYCOPYRROLATE 0.2 MG/ML IJ SOLN
INTRAMUSCULAR | Status: DC | PRN
  Administered 2013-01-09: 0.4 mg via INTRAVENOUS

## 2013-01-09 MED ORDER — ONDANSETRON HCL 4 MG PO TABS: 4.0000 mg | ORAL_TABLET | Freq: Four times a day (QID) | ORAL | Status: DC | PRN

## 2013-01-09 MED ORDER — DOCUSATE SODIUM 100 MG PO CAPS
100.0000 mg | ORAL_CAPSULE | Freq: Two times a day (BID) | ORAL | Status: DC
  Administered 2013-01-09 – 2013-01-12 (×6): 100 mg via ORAL
  Filled 2013-01-09 (×8): qty 1

## 2013-01-09 MED ORDER — ARTIFICIAL TEARS OP OINT
TOPICAL_OINTMENT | OPHTHALMIC | Status: DC | PRN
  Administered 2013-01-09: 1 via OPHTHALMIC

## 2013-01-09 MED ORDER — FENTANYL CITRATE 0.05 MG/ML IJ SOLN: 50.0000 ug | INTRAMUSCULAR | Status: DC | PRN

## 2013-01-09 MED ORDER — FENTANYL CITRATE 0.05 MG/ML IJ SOLN
INTRAMUSCULAR | Status: DC | PRN
  Administered 2013-01-09 (×2): 50 ug via INTRAVENOUS
  Administered 2013-01-09: 150 ug via INTRAVENOUS
  Administered 2013-01-09 (×2): 50 ug via INTRAVENOUS

## 2013-01-09 MED ORDER — PROPOFOL 10 MG/ML IV BOLUS
INTRAVENOUS | Status: DC | PRN
  Administered 2013-01-09: 200 mg via INTRAVENOUS

## 2013-01-09 MED ORDER — NEOSTIGMINE METHYLSULFATE 1 MG/ML IJ SOLN
INTRAMUSCULAR | Status: DC | PRN
  Administered 2013-01-09: 3 mg via INTRAVENOUS

## 2013-01-09 MED ORDER — HYDROCODONE-ACETAMINOPHEN 5-325 MG PO TABS
1.0000 | ORAL_TABLET | ORAL | Status: DC | PRN
  Administered 2013-01-09 – 2013-01-12 (×11): 2 via ORAL
  Filled 2013-01-09 (×11): qty 2

## 2013-01-09 MED ORDER — LIDOCAINE HCL (CARDIAC) 20 MG/ML IV SOLN
INTRAVENOUS | Status: DC | PRN
  Administered 2013-01-09: 70 mg via INTRAVENOUS

## 2013-01-09 MED ORDER — OXYCODONE-ACETAMINOPHEN 5-325 MG PO TABS: 1.0000 | ORAL_TABLET | ORAL | Status: DC | PRN

## 2013-01-09 MED ORDER — CEFUROXIME SODIUM 1.5 G IJ SOLR
INTRAMUSCULAR | Status: DC | PRN
  Administered 2013-01-09: 1.5 g

## 2013-01-09 MED ORDER — MAGNESIUM CITRATE PO SOLN
1.0000 | Freq: Once | ORAL | Status: AC | PRN
  Filled 2013-01-09: qty 296

## 2013-01-09 MED ORDER — BISACODYL 5 MG PO TBEC
5.0000 mg | DELAYED_RELEASE_TABLET | Freq: Every day | ORAL | Status: DC | PRN
  Administered 2013-01-11: 5 mg via ORAL

## 2013-01-09 MED ORDER — DEXAMETHASONE SODIUM PHOSPHATE 4 MG/ML IJ SOLN
INTRAMUSCULAR | Status: DC | PRN
  Administered 2013-01-09: 8 mg via INTRAVENOUS

## 2013-01-09 MED ORDER — ONDANSETRON HCL 4 MG/2ML IJ SOLN
INTRAMUSCULAR | Status: DC | PRN
  Administered 2013-01-09: 4 mg via INTRAVENOUS

## 2013-01-09 MED ORDER — ROCURONIUM BROMIDE 100 MG/10ML IV SOLN
INTRAVENOUS | Status: DC | PRN
  Administered 2013-01-09: 50 mg via INTRAVENOUS

## 2013-01-09 MED ORDER — DIPHENHYDRAMINE HCL 12.5 MG/5ML PO ELIX: 12.5000 mg | ORAL_SOLUTION | ORAL | Status: DC | PRN

## 2013-01-09 MED ORDER — METOCLOPRAMIDE HCL 10 MG PO TABS
5.0000 mg | ORAL_TABLET | Freq: Three times a day (TID) | ORAL | Status: DC | PRN
  Administered 2013-01-11 – 2013-01-12 (×2): 10 mg via ORAL
  Filled 2013-01-09 (×3): qty 1

## 2013-01-09 MED ORDER — KCL IN DEXTROSE-NACL 20-5-0.45 MEQ/L-%-% IV SOLN
INTRAVENOUS | Status: DC
  Administered 2013-01-09: 125 mL/h via INTRAVENOUS
  Administered 2013-01-10 (×2): via INTRAVENOUS
  Filled 2013-01-09 (×11): qty 1000

## 2013-01-09 MED ORDER — LACTATED RINGERS IV SOLN
INTRAVENOUS | Status: DC | PRN
  Administered 2013-01-09 (×2): via INTRAVENOUS

## 2013-01-09 MED ORDER — HYDROCHLOROTHIAZIDE 25 MG PO TABS
25.0000 mg | ORAL_TABLET | Freq: Every day | ORAL | Status: DC
  Administered 2013-01-10 – 2013-01-12 (×3): 25 mg via ORAL
  Filled 2013-01-09 (×4): qty 1

## 2013-01-09 MED ORDER — METOCLOPRAMIDE HCL 5 MG/ML IJ SOLN
5.0000 mg | Freq: Three times a day (TID) | INTRAMUSCULAR | Status: DC | PRN
  Administered 2013-01-09 – 2013-01-10 (×2): 10 mg via INTRAVENOUS
  Filled 2013-01-09 (×3): qty 2

## 2013-01-09 MED ORDER — HYDROMORPHONE HCL PF 1 MG/ML IJ SOLN
0.2500 mg | INTRAMUSCULAR | Status: DC | PRN
  Administered 2013-01-09 (×4): 0.5 mg via INTRAVENOUS

## 2013-01-09 MED ORDER — LACTATED RINGERS IV SOLN: INTRAVENOUS | Status: DC

## 2013-01-09 MED ORDER — METHOCARBAMOL 100 MG/ML IJ SOLN
500.0000 mg | INTRAVENOUS | Status: AC
  Administered 2013-01-09: 500 mg via INTRAVENOUS
  Filled 2013-01-09: qty 5

## 2013-01-09 MED ORDER — ACETAMINOPHEN 650 MG RE SUPP: 650.0000 mg | Freq: Four times a day (QID) | RECTAL | Status: DC | PRN

## 2013-01-09 MED ORDER — ONDANSETRON HCL 4 MG/2ML IJ SOLN: 4.0000 mg | Freq: Four times a day (QID) | INTRAMUSCULAR | Status: DC | PRN

## 2013-01-09 MED ORDER — BUPIVACAINE LIPOSOME 1.3 % IJ SUSP
20.0000 mL | Freq: Once | INTRAMUSCULAR | Status: DC
  Filled 2013-01-09: qty 20

## 2013-01-09 MED ORDER — LIDOCAINE HCL 4 % MT SOLN
OROMUCOSAL | Status: DC | PRN
  Administered 2013-01-09: 4 mL via TOPICAL

## 2013-01-09 MED ORDER — METHOCARBAMOL 100 MG/ML IJ SOLN
500.0000 mg | Freq: Four times a day (QID) | INTRAVENOUS | Status: DC | PRN
  Filled 2013-01-09: qty 5

## 2013-01-09 MED ORDER — ACETAMINOPHEN 325 MG PO TABS: 650.0000 mg | ORAL_TABLET | Freq: Four times a day (QID) | ORAL | Status: DC | PRN

## 2013-01-09 MED ORDER — SODIUM CHLORIDE 0.9 % IJ SOLN
INTRAMUSCULAR | Status: DC | PRN
  Administered 2013-01-09: 13:00:00

## 2013-01-09 MED ORDER — CEFUROXIME SODIUM 1.5 G IJ SOLR
INTRAMUSCULAR | Status: AC
  Filled 2013-01-09: qty 1.5

## 2013-01-09 MED ORDER — HYDROMORPHONE HCL PF 1 MG/ML IJ SOLN
0.5000 mg | INTRAMUSCULAR | Status: DC | PRN
  Administered 2013-01-09 – 2013-01-10 (×2): 0.5 mg via INTRAVENOUS
  Filled 2013-01-09 (×2): qty 1

## 2013-01-09 MED ORDER — HYDROMORPHONE HCL PF 1 MG/ML IJ SOLN
INTRAMUSCULAR | Status: AC
  Administered 2013-01-09: 0.5 mg via INTRAVENOUS
  Filled 2013-01-09: qty 2

## 2013-01-09 MED ORDER — HYDROMORPHONE HCL PF 1 MG/ML IJ SOLN: 0.5000 mg | INTRAMUSCULAR | Status: DC | PRN

## 2013-01-09 MED ORDER — ALUM & MAG HYDROXIDE-SIMETH 200-200-20 MG/5ML PO SUSP
30.0000 mL | ORAL | Status: DC | PRN
  Administered 2013-01-11 – 2013-01-12 (×2): 30 mL via ORAL
  Filled 2013-01-09 (×2): qty 30

## 2013-01-09 MED ORDER — METHOCARBAMOL 500 MG PO TABS
500.0000 mg | ORAL_TABLET | Freq: Four times a day (QID) | ORAL | Status: DC | PRN
  Administered 2013-01-09 – 2013-01-12 (×6): 500 mg via ORAL
  Filled 2013-01-09 (×6): qty 1

## 2013-01-09 MED ORDER — METHOCARBAMOL 500 MG PO TABS: 500.0000 mg | ORAL_TABLET | Freq: Two times a day (BID) | ORAL | Status: DC

## 2013-01-09 MED ORDER — ASPIRIN EC 325 MG PO TBEC
325.0000 mg | DELAYED_RELEASE_TABLET | Freq: Every day | ORAL | Status: DC
  Administered 2013-01-10 – 2013-01-12 (×3): 325 mg via ORAL
  Filled 2013-01-09 (×5): qty 1

## 2013-01-09 MED ORDER — SODIUM CHLORIDE 0.9 % IR SOLN
Status: DC | PRN
  Administered 2013-01-09: 3000 mL
  Administered 2013-01-09: 1000 mL

## 2013-01-09 MED ORDER — PHENOL 1.4 % MT LIQD: 1.0000 | OROMUCOSAL | Status: DC | PRN

## 2013-01-09 SURGICAL SUPPLY — 63 items
BANDAGE ESMARK 6X9 LF (GAUZE/BANDAGES/DRESSINGS) ×1 IMPLANT
BLADE SAG 18X100X1.27 (BLADE) ×2 IMPLANT
BLADE SAW SGTL 13X75X1.27 (BLADE) ×2 IMPLANT
BLADE SURG ROTATE 9660 (MISCELLANEOUS) IMPLANT
BNDG ELASTIC 6X10 VLCR STRL LF (GAUZE/BANDAGES/DRESSINGS) ×2 IMPLANT
BNDG ESMARK 6X9 LF (GAUZE/BANDAGES/DRESSINGS) ×2
BOWL SMART MIX CTS (DISPOSABLE) ×2 IMPLANT
CAPT RP KNEE ×2 IMPLANT
CEMENT HV SMART SET (Cement) ×4 IMPLANT
CLOTH BEACON ORANGE TIMEOUT ST (SAFETY) ×2 IMPLANT
COVER SURGICAL LIGHT HANDLE (MISCELLANEOUS) ×2 IMPLANT
CUFF TOURNIQUET SINGLE 34IN LL (TOURNIQUET CUFF) IMPLANT
CUFF TOURNIQUET SINGLE 44IN (TOURNIQUET CUFF) ×2 IMPLANT
DRAPE EXTREMITY T 121X128X90 (DRAPE) ×2 IMPLANT
DRAPE U-SHAPE 47X51 STRL (DRAPES) ×2 IMPLANT
DRSG PAD ABDOMINAL 8X10 ST (GAUZE/BANDAGES/DRESSINGS) ×2 IMPLANT
DURAPREP 26ML APPLICATOR (WOUND CARE) ×4 IMPLANT
ELECT REM PT RETURN 9FT ADLT (ELECTROSURGICAL) ×2
ELECTRODE REM PT RTRN 9FT ADLT (ELECTROSURGICAL) ×1 IMPLANT
EVACUATOR 1/8 PVC DRAIN (DRAIN) ×2 IMPLANT
GAUZE XEROFORM 1X8 LF (GAUZE/BANDAGES/DRESSINGS) ×4 IMPLANT
GLOVE BIO SURGEON STRL SZ7.5 (GLOVE) ×2 IMPLANT
GLOVE BIO SURGEON STRL SZ8.5 (GLOVE) ×4 IMPLANT
GLOVE BIOGEL PI IND STRL 6.5 (GLOVE) ×2 IMPLANT
GLOVE BIOGEL PI IND STRL 7.0 (GLOVE) ×1 IMPLANT
GLOVE BIOGEL PI IND STRL 8 (GLOVE) ×2 IMPLANT
GLOVE BIOGEL PI IND STRL 9 (GLOVE) ×1 IMPLANT
GLOVE BIOGEL PI INDICATOR 6.5 (GLOVE) ×2
GLOVE BIOGEL PI INDICATOR 7.0 (GLOVE) ×1
GLOVE BIOGEL PI INDICATOR 8 (GLOVE) ×2
GLOVE BIOGEL PI INDICATOR 9 (GLOVE) ×1
GLOVE ECLIPSE 6.5 STRL STRAW (GLOVE) ×2 IMPLANT
GLOVE ORTHO TXT STRL SZ7.5 (GLOVE) ×2 IMPLANT
GOWN PREVENTION PLUS XLARGE (GOWN DISPOSABLE) ×4 IMPLANT
GOWN STRL NON-REIN LRG LVL3 (GOWN DISPOSABLE) ×2 IMPLANT
GOWN STRL REIN XL XLG (GOWN DISPOSABLE) ×4 IMPLANT
HANDPIECE INTERPULSE COAX TIP (DISPOSABLE) ×1
HOOD PEEL AWAY FACE SHEILD DIS (HOOD) ×6 IMPLANT
KIT BASIN OR (CUSTOM PROCEDURE TRAY) ×2 IMPLANT
KIT ROOM TURNOVER OR (KITS) ×2 IMPLANT
MANIFOLD NEPTUNE II (INSTRUMENTS) ×2 IMPLANT
NEEDLE SPNL 18GX3.5 QUINCKE PK (NEEDLE) ×2 IMPLANT
NS IRRIG 1000ML POUR BTL (IV SOLUTION) ×2 IMPLANT
PACK TOTAL JOINT (CUSTOM PROCEDURE TRAY) ×2 IMPLANT
PAD ARMBOARD 7.5X6 YLW CONV (MISCELLANEOUS) ×4 IMPLANT
PAD CAST 4YDX4 CTTN HI CHSV (CAST SUPPLIES) ×1 IMPLANT
PADDING CAST COTTON 4X4 STRL (CAST SUPPLIES) ×1
PADDING CAST COTTON 6X4 STRL (CAST SUPPLIES) ×2 IMPLANT
SET HNDPC FAN SPRY TIP SCT (DISPOSABLE) ×1 IMPLANT
SPONGE GAUZE 4X4 12PLY (GAUZE/BANDAGES/DRESSINGS) ×4 IMPLANT
STAPLER VISISTAT 35W (STAPLE) ×2 IMPLANT
SUCTION FRAZIER TIP 10 FR DISP (SUCTIONS) ×2 IMPLANT
SUT VIC AB 0 CTX 36 (SUTURE) ×2
SUT VIC AB 0 CTX36XBRD ANTBCTR (SUTURE) ×2 IMPLANT
SUT VIC AB 1 CTX 36 (SUTURE) ×1
SUT VIC AB 1 CTX36XBRD ANBCTR (SUTURE) ×1 IMPLANT
SUT VIC AB 2-0 CT1 27 (SUTURE) ×2
SUT VIC AB 2-0 CT1 TAPERPNT 27 (SUTURE) ×2 IMPLANT
SYR 50ML LL SCALE MARK (SYRINGE) ×2 IMPLANT
TOWEL OR 17X24 6PK STRL BLUE (TOWEL DISPOSABLE) ×2 IMPLANT
TOWEL OR 17X26 10 PK STRL BLUE (TOWEL DISPOSABLE) ×2 IMPLANT
TRAY FOLEY CATH 14FR (SET/KITS/TRAYS/PACK) ×2 IMPLANT
WATER STERILE IRR 1000ML POUR (IV SOLUTION) ×2 IMPLANT

## 2013-01-09 NOTE — Interval H&P Note (Signed)
History and Physical Interval Note:  01/09/2013 11:59 AM  Kim Castro  has presented today for surgery, with the diagnosis of RIGHT KNEE OSTEOARTHRITIS  The various methods of treatment have been discussed with the patient and family. After consideration of risks, benefits and other options for treatment, the patient has consented to  Procedure(s): TOTAL KNEE ARTHROPLASTY (Right) as a surgical intervention .  The patient's history has been reviewed, patient examined, no change in status, stable for surgery.  I have reviewed the patient's chart and labs.  Questions were answered to the patient's satisfaction.     Nestor Lewandowsky

## 2013-01-09 NOTE — Op Note (Signed)
PATIENT ID:      Kim Castro  MRN:     [id number] DOB/AGE:    1945-08-07 / 67 y.o.       OPERATIVE REPORT    DATE OF PROCEDURE:  01/09/2013       PREOPERATIVE DIAGNOSIS:   RIGHT KNEE OSTEOARTHRITIS      Estimated body mass index is 41.80 kg/(m^2) as calculated from the following:   Height as of 01/04/13: 5\' 5"  (1.651 m).   Weight as of 10/18/12: 113.944 kg (251 lb 3.2 oz).                                                        POSTOPERATIVE DIAGNOSIS:   RIGHT KNEE OSTEOARTHRITIS                                                                      PROCEDURE:  Procedure(s): TOTAL KNEE ARTHROPLASTY Using Depuy Sigma RP implants #4NR Femur, #3Tibia, 10mm sigma RP bearing, 35 Patella     SURGEON: Janasia Coverdale J    ASSISTANT:   Eric K. [employer]   (Present and scrubbed throughout the case, critical for assistance with exposure, retraction, instrumentation, and closure.)         ANESTHESIA: GET Exparel  DRAINS: foley, 2 medium hemovac in knee   TOURNIQUET TIME:   COMPLICATIONS:  None     SPECIMENS: None   INDICATIONS FOR PROCEDURE: The patient has  RIGHT KNEE OSTEOARTHRITIS, varus deformities, XR shows bone on bone arthritis. Patient has failed all conservative measures including anti-inflammatory medicines, narcotics, attempts at  exercise and weight loss, cortisone injections and viscosupplementation.  Risks and benefits of surgery have been discussed, questions answered.   DESCRIPTION OF PROCEDURE: The patient identified by armband, received  IV antibiotics, in the holding area at [facility]. Patient taken to the operating room, appropriate anesthetic  monitors were attached, and general endotracheal anesthesia induced with  the patient in supine position, Foley catheter was inserted. Tourniquet  applied high to the operative thigh. Lateral post and foot positioner  applied to the table, the lower extremity was then prepped and draped  in usual sterile fashion from  the ankle to the tourniquet. Time-out procedure was performed. The limb was wrapped with an Esmarch bandage and the tourniquet inflated to 350 mmHg. We began the operation by making the anterior midline incision starting at handbreadth above the patella going over the patella 1 cm medial to and  4 cm distal to the tibial tubercle. Small bleeders in the skin and the  subcutaneous tissue identified and cauterized. Transverse retinaculum was incised and reflected medially and a medial parapatellar arthrotomy was accomplished. the patella was everted and theprepatellar fat pad resected. The superficial medial collateral  ligament was then elevated from anterior to posterior along the proximal  flare of the tibia and anterior half of the menisci resected. The knee was hyperflexed exposing bone on bone arthritis. Peripheral and notch osteophytes as well as the cruciate ligaments were then resected. We continued to  work our way around posteriorly  along the proximal tibia, and externally  rotated the tibia subluxing it out from underneath the femur. A McHale  retractor was placed through the notch and a lateral Hohmann retractor  placed, and we then drilled through the proximal tibia in line with the  axis of the tibia followed by an intramedullary guide rod and 2-degree  posterior slope cutting guide. The tibial cutting guide was pinned into place  allowing resection of 4 mm of bone medially and about 10 mm of bone  laterally because of her varus deformity. Satisfied with the tibial resection, we then  entered the distal femur 2 mm anterior to the PCL origin with the  intramedullary guide rod and applied the distal femoral cutting guide  set at 11mm, with 5 degrees of valgus. This was pinned along the  epicondylar axis. At this point, the distal femoral cut was accomplished without difficulty. We then sized for a #4NR femoral component and pinned the guide in 3 degrees of external rotation.The chamfer  cutting guide was pinned into place. The anterior, posterior, and chamfer cuts were accomplished without difficulty followed by  the Sigma RP box cutting guide and the box cut. We also removed posterior osteophytes from the posterior femoral condyles. At this  time, the knee was brought into full extension. We checked our  extension and flexion gaps and found them symmetric at 10mm.  The patella thickness measured at 24 mm. We set the cutting guide at 14 and removed the posterior 10 mm  of the patella, sized for a 35 button and drilled the lollipop. The knee  was then once again hyperflexed exposing the proximal tibia. We sized for a #3 tibial base plate, applied the smokestack and the conical reamer followed by the the Delta fin keel punch. We then hammered into place the Sigma RP trial femoral component, inserted a 10-mm trial bearing, trial patellar button, and took the knee through range of motion from 0-130 degrees. No thumb pressure was required for patellar  tracking. At this point, all trial components were removed, a double batch of DePuy HV cement with 1500 mg of Zinacef was mixed and applied to all bony metallic mating surfaces except for the posterior condyles of the femur itself. In order, we  hammered into place the tibial tray and removed excess cement, the femoral component and removed excess cement, a 10-mm Sigma RP bearing  was inserted, and the knee brought to full extension with compression.  The patellar button was clamped into place, and excess cement  removed. While the cement cured the wound was irrigated out with normal saline solution pulse lavage, and medium Hemovac drains were placed from an anterolateral  approach. Ligament stability and patellar tracking were checked and found to be excellent. The parapatellar arthrotomy was closed with  running #1 Vicryl suture. The subcutaneous tissue with 0 and 2-0 undyed  Vicryl suture, and the skin with skin staples. A dressing of  Xeroform,  4 x 4, dressing sponges, Webril, and Ace wrap applied. The patient  awakened, extubated, and taken to recovery room without difficulty.   Nithya Meriweather J 01/09/2013, 1:52 PM

## 2013-01-09 NOTE — Transfer of Care (Signed)
Immediate Anesthesia Transfer of Care Note  Patient: Kim Castro  Procedure(s) Performed: Procedure(s): TOTAL KNEE ARTHROPLASTY (Right)  Patient Location: PACU  Anesthesia Type:General  Level of Consciousness: awake, alert  and oriented  Airway & Oxygen Therapy: Patient Spontanous Breathing and Patient connected to nasal cannula oxygen  Post-op Assessment: Report given to PACU RN  Post vital signs: Reviewed and stable  Complications: No apparent anesthesia complications

## 2013-01-09 NOTE — Preoperative (Signed)
Beta Blockers   Reason not to administer Beta Blockers:Not Applicable 

## 2013-01-09 NOTE — Progress Notes (Signed)
Orthopedic Tech Progress Note Patient Details:  Kim Castro 1945/12/30 [id number] Footsie roll  CPM Right Knee CPM Right Knee: On Right Knee Flexion (Degrees): 60 Right Knee Extension (Degrees): 0 Additional Comments: applied overhead frame to bed   [name] 01/09/2013, 4:26 PM

## 2013-01-09 NOTE — Anesthesia Preprocedure Evaluation (Signed)
Anesthesia Evaluation  Patient identified by MRN, date of birth, ID band Patient awake    Reviewed: Allergy & Precautions, H&P , NPO status , Patient's Chart, lab work & pertinent test results  Airway Mallampati: II  Neck ROM: full    Dental   Pulmonary          Cardiovascular hypertension,     Neuro/Psych    GI/Hepatic GERD-  ,  Endo/Other  Morbid obesity  Renal/GU      Musculoskeletal  (+) Arthritis -,   Abdominal   Peds  Hematology   Anesthesia Other Findings   Reproductive/Obstetrics                           Anesthesia Physical Anesthesia Plan  ASA: II  Anesthesia Plan: General and Regional   Post-op Pain Management: MAC Combined w/ Regional for Post-op pain   Induction: Intravenous  Airway Management Planned: LMA  Additional Equipment:   Intra-op Plan:   Post-operative Plan: Extubation in OR  Informed Consent: I have reviewed the patients History and Physical, chart, labs and discussed the procedure including the risks, benefits and alternatives for the proposed anesthesia with the patient or authorized representative who has indicated his/her understanding and acceptance.     Plan Discussed with: CRNA, Anesthesiologist and Surgeon  Anesthesia Plan Comments:         Anesthesia Quick Evaluation

## 2013-01-09 NOTE — Anesthesia Postprocedure Evaluation (Signed)
Anesthesia Post Note  Patient: Kim Castro  Procedure(s) Performed: Procedure(s) (LRB): TOTAL KNEE ARTHROPLASTY (Right)  Anesthesia type: General  Patient location: PACU  Post pain: Pain level controlled and Adequate analgesia  Post assessment: Post-op Vital signs reviewed, Patient's Cardiovascular Status Stable, Respiratory Function Stable, Patent Airway and Pain level controlled  Last Vitals:  Filed Vitals:   01/09/13 1515  BP: 138/57  Pulse: 65  Temp:   Resp: 12    Post vital signs: Reviewed and stable  Level of consciousness: awake, alert  and oriented  Complications: No apparent anesthesia complications

## 2013-01-10 LAB — CBC
MCH: 31.3 pg (ref 26.0–34.0)
MCHC: 32.7 g/dL (ref 30.0–36.0)
MCV: 95.6 fL (ref 78.0–100.0)
Platelets: 291 10*3/uL (ref 150–400)
RDW: 15.1 % (ref 11.5–15.5)

## 2013-01-10 NOTE — Progress Notes (Signed)
01/10/13 Spoke iwth patient about HHC. She selected [facility]. Contacted  Uyen Eichholz at [place] and set up [facility], OT and Charity fundraiser. [employer] providing CPM, patient already has rolling walker and 3n1. Patient states tha she will have family available to assist after d/c. Jacquelynn Cree RN, BSN, CCM

## 2013-01-10 NOTE — Evaluation (Signed)
Occupational Therapy Evaluation Patient Details Name: Kim Castro MRN: [id number] DOB: 1945/09/09 Today's Date: 01/10/2013 Time: 6213-0865 OT Time Calculation (min): 22 min  OT Assessment / Plan / Recommendation History of present illness Pt. admitted for elective R TKA.  Had left knee doen earlier this summer   Clinical Impression   Pt demos decline in function with ADLs and ADL mobility safety and would benefit from acute OT services to address impairments to help restore PLOF to return home safely    OT Assessment  Patient needs continued OT Services    Follow Up Recommendations  No OT follow up;Supervision/Assistance - 24 hour    Barriers to Discharge   pt will have 24/7 assist from family  Equipment Recommendations       Recommendations for Other Services    Frequency  Min 2X/week    Precautions / Restrictions Precautions Precautions: Knee Restrictions Weight Bearing Restrictions: Yes RLE Weight Bearing: Weight bearing as tolerated   Pertinent Vitals/Pain 3/10    ADL  Grooming: Performed;Wash/dry hands;Wash/dry face;Min guard Where Assessed - Grooming: Supported standing Upper Body Bathing: Simulated;Supervision/safety;Set up Lower Body Bathing: Simulated;Moderate assistance Upper Body Dressing: Performed;Set up;Supervision/safety Lower Body Dressing: Performed;Moderate assistance Toilet Transfer: Performed;Minimal assistance Toilet Transfer Method: Sit to stand Toilet Transfer Equipment: Raised toilet seat with arms (or 3-in-1 over toilet) Toileting - Clothing Manipulation and Hygiene: Performed;Minimal assistance Where Assessed - Glass blower/designer Manipulation and Hygiene: Standing Tub/Shower Transfer Method: Not assessed Transfers/Ambulation Related to ADLs: cues for safety ADL Comments: pt has DME at home and ADL A/E kit from previous surgery    OT Diagnosis: Generalized weakness;Acute pain  OT Problem List: Decreased activity tolerance;Pain;Impaired  balance (sitting and/or standing) OT Treatment Interventions: Self-care/ADL training;Therapeutic exercise;Neuromuscular education;Balance training;Patient/family education;Therapeutic activities;DME and/or AE instruction   OT Goals(Current goals can be found in the care plan section) Acute Rehab OT Goals Patient Stated Goal: To return home Time For Goal Achievement: 01/17/13 Potential to Achieve Goals: Good ADL Goals Pt Will Perform Grooming: with set-up;with supervision;with caregiver independent in assisting;standing Pt Will Perform Lower Body Bathing: with min assist;with adaptive equipment;with caregiver independent in assisting Pt Will Perform Lower Body Dressing: with min assist;with caregiver independent in assisting;with adaptive equipment Pt Will Transfer to Toilet: with min guard assist;with supervision;grab bars (3 in 1) Pt Will Perform Toileting - Clothing Manipulation and hygiene: with min guard assist;with supervision;sit to/from stand;sitting/lateral leans  Visit Information  Last OT Received On: 01/10/13 Assistance Needed: +1 History of Present Illness: Pt. admitted for elective R TKA.  Had left knee doen earlier this summer       Prior Functioning     Home Living Family/patient expects to be discharged to:: Private residence Living Arrangements: Spouse/significant other;Other relatives Available Help at Discharge: Family;Available 24 hours/day Type of Home: House Home Access: Stairs to enter [employer] of Steps: 2 Entrance Stairs-Rails: None Home Layout: Two level;Able to live on main level with bedroom/bathroom Home Equipment: Shower seat;Walker - 2 wheels;Bedside commode Additional Comments: also has ADLA/E kit fromprevious surgery Prior Function Level of Independence: Independent with assistive device(s) Communication Communication: No difficulties Dominant Hand: Right         Vision/Perception Vision - History Baseline Vision: Wears  glasses all the time Patient Visual Report: No change from baseline Perception Perception: Within Functional Limits   Cognition  Cognition Arousal/Alertness: Awake/alert Behavior During Therapy: WFL for tasks assessed/performed Overall Cognitive Status: Within Functional Limits for tasks assessed    Extremity/Trunk Assessment Upper Extremity Assessment Upper Extremity  Assessment: Overall WFL for tasks assessed Lower Extremity Assessment Lower Extremity Assessment: RLE deficits/detail RLE Deficits / Details: good ankle pump, fair quad set Cervical / Trunk Assessment Cervical / Trunk Assessment: Normal     Mobility Bed Mobility Bed Mobility: Sit to Supine Sit to Supine: 4: Min assist;HOB flat;With rail Details for Bed Mobility Assistance: min assist for R LE managament Transfers Sit to Stand: 4: Min assist;With armrests;From chair/3-in-1 Stand to Sit: 4: Min assist;To bed;With upper extremity assist Details for Transfer Assistance: cues for hand placement and technique     Exercise     Balance Balance Balance Assessed: Yes Dynamic Standing Balance Dynamic Standing - Balance Support: No upper extremity supported;During functional activity Dynamic Standing - Level of Assistance: 5: Stand by assistance   End of Session OT - End of Session Equipment Utilized During Treatment: Rolling walker;Other (comment) (3 in 1) Patient left: in bed;with call bell/phone within reach CPM Right Knee CPM Right Knee: Off   GO     Margaretmary Eddy [facility] 01/10/2013, 3:29 PM

## 2013-01-10 NOTE — Progress Notes (Signed)
UR COMPLETED  

## 2013-01-10 NOTE — Evaluation (Signed)
Physical Therapy Evaluation Patient Details Name: Kim Castro MRN: [id number] DOB: 11-29-1945 Today's Date: 01/10/2013 Time: 4098-1191 PT Time Calculation (min): 26 min  PT Assessment / Plan / Recommendation History of Present Illness  Pt. admitted for elective R TKA.  Had left knee doen earlier this summer  Clinical Impression  This patient underwent a right TKA and presents to PT with anticipated post-op decrease in strength and ROM, decreased functional mobility and gait.  Pt. Will benefit from acute PT to address these and below issues. She currently is bothered by nausea and headache and wants all the lights off.  She did well first time up with PT and anticipate she will progress well for DC home with 24 hour assist.     PT Assessment  Patient needs continued PT services    Follow Up Recommendations  Home health PT;Supervision/Assistance - 24 hour;Supervision for mobility/OOB    Does the patient have the potential to tolerate intense rehabilitation      Barriers to Discharge        Equipment Recommendations  None recommended by PT    Recommendations for Other Services     Frequency 7X/week    Precautions / Restrictions Precautions Precautions: Knee Restrictions Weight Bearing Restrictions: Yes RLE Weight Bearing: Weight bearing as tolerated   Pertinent Vitals/Pain See vitals tab       Mobility  Bed Mobility Bed Mobility: Sit to Supine Sit to Supine: 4: Min assist;HOB flat;With rail Details for Bed Mobility Assistance: min assist for R LE managament Transfers Transfers: Sit to Stand;Stand to Sit Sit to Stand: 4: Min assist;With armrests;From chair/3-in-1 Stand to Sit: 4: Min assist;To bed;With upper extremity assist Details for Transfer Assistance: cues for hand placement and technique Ambulation/Gait Ambulation/Gait Assistance: 4: Min assist Ambulation Distance (Feet): 25 Feet Assistive device: Rolling walker Ambulation/Gait Assistance Details: cues  for sequence and correct step length Gait Pattern: Step-to pattern Gait velocity: decreased    Exercises     PT Diagnosis: Difficulty walking;Abnormality of gait;Acute pain  PT Problem List: Decreased strength;Decreased range of motion;Decreased activity tolerance;Decreased balance;Decreased mobility;Decreased knowledge of use of DME;Decreased knowledge of precautions;Pain PT Treatment Interventions: DME instruction;Gait training;Stair training;Functional mobility training;Therapeutic activities;Therapeutic exercise;Balance training;Patient/family education     PT Goals(Current goals can be found in the care plan section) Acute Rehab PT Goals Patient Stated Goal: decreased nausea and headache; return home PT Goal Formulation: With patient Time For Goal Achievement: 01/17/13 Potential to Achieve Goals: Good  Visit Information  Last PT Received On: 01/10/13 Assistance Needed: +1 PT/OT Co-Evaluation/Treatment: Yes History of Present Illness: Pt. admitted for elective R TKA.  Had left knee doen earlier this summer       Prior Functioning  Home Living Family/patient expects to be discharged to:: Private residence Living Arrangements: Spouse/significant other;Other relatives Available Help at Discharge: Family;Available 24 hours/day Type of Home: House Home Access: Stairs to enter [employer] of Steps: 2 Entrance Stairs-Rails: None Home Layout: Two level;Able to live on main level with bedroom/bathroom Home Equipment: Shower seat;Walker - 2 wheels;Bedside commode Additional Comments: also has ADLA/E kit fromprevious surgery Prior Function Level of Independence: Independent with assistive device(s) Communication Communication: No difficulties Dominant Hand: Right    Cognition  Cognition Arousal/Alertness: Awake/alert Behavior During Therapy: WFL for tasks assessed/performed (pt. in darkened room due to headache and nausea) Overall Cognitive Status: Within  Functional Limits for tasks assessed    Extremity/Trunk Assessment Upper Extremity Assessment Upper Extremity Assessment: Overall WFL for tasks assessed Lower Extremity Assessment  Lower Extremity Assessment: RLE deficits/detail RLE Deficits / Details: good ankle pump, fair quad set Cervical / Trunk Assessment Cervical / Trunk Assessment: Normal   Balance Balance Balance Assessed: Yes Dynamic Standing Balance Dynamic Standing - Balance Support: No upper extremity supported;During functional activity Dynamic Standing - Level of Assistance: 5: Stand by assistance  End of Session PT - End of Session Equipment Utilized During Treatment: Gait belt Activity Tolerance: Patient tolerated treatment well;Patient limited by fatigue;Patient limited by pain Patient left: in bed;with call bell/phone within reach;with family/visitor present Nurse Communication: Mobility status;Weight bearing status;Precautions CPM Right Knee Right Knee Flexion (Degrees): 60 Right Knee Extension (Degrees): -7  GP     Ferman Hamming 01/10/2013, 3:14 PM Weldon Picking PT Acute Rehab Services [phone] Beeper [phone]

## 2013-01-10 NOTE — Progress Notes (Signed)
PATIENT ID: Kim Castro  MRN: [id number]  DOB/AGE:  05-Mar-1946 / 67 y.o.  1 Day Post-Op Procedure(s) (LRB): TOTAL KNEE ARTHROPLASTY (Right)    PROGRESS NOTE Subjective: Patient is alert, oriented, no Nausea, no Vomiting, yes passing gas, no Bowel Movement. Taking PO sips, liquid breakfast. Denies SOB, Chest or Calf Pain. Using Incentive Spirometer, PAS in place. Ambulate WBAT, CPM 0-60 Patient reports pain as 6 on 0-10 scale  .    Objective: Vital signs in last 24 hours: Filed Vitals:   01/10/13 0000 01/10/13 0100 01/10/13 0400 01/10/13 0612  BP:    118/50  Pulse:    62  Temp:    98 F (36.7 C)  TempSrc:      Resp: 16  16 16   Height:  5\' 5"  (1.651 m)    Weight:  112.038 kg (247 lb)    SpO2: 86%  99% 99%      Intake/Output from previous day: I/O last 3 completed shifts: In: 3514.2 [P.O.:360; I.V.:3154.2] Out: 915 [Urine:625; Drains:240; Blood:50]   Intake/Output this shift: Total I/O In: 480 [P.O.:480] Out: -    LABORATORY DATA:  Recent Labs  01/10/13 0545  WBC 14.7*  HGB 10.6*  HCT 32.4*  PLT 291    Examination: Neurologically intact Neurovascular intact Sensation intact distally Intact pulses distally Dorsiflexion/Plantar flexion intact Incision: dressing C/D/I Compartment soft}  Assessment:   1 Day Post-Op Procedure(s) (LRB): TOTAL KNEE ARTHROPLASTY (Right) ADDITIONAL DIAGNOSIS:  Hypertension Blood and plasma separated in drain indicating minimal recent drainage, drain pulled without difficulty.  Plan: PT/OT WBAT, CPM 5/hrs day until ROM 0-90 degrees, then D/C CPM DVT Prophylaxis:  SCDx72hrs, ASA 325 mg BID x 2 weeks DISCHARGE PLAN: Home when pt passes PT goals. DISCHARGE NEEDS: HHPT, HHRN, CPM, Walker and 3-in-1 comode seat     Kim Castro R 01/10/2013, 9:24 AM

## 2013-01-11 ENCOUNTER — Encounter (HOSPITAL_COMMUNITY): Payer: Self-pay | Admitting: Orthopedic Surgery

## 2013-01-11 LAB — CBC
HCT: 32.3 % — ABNORMAL LOW (ref 36.0–46.0)
MCHC: 32.5 g/dL (ref 30.0–36.0)
RDW: 15.5 % (ref 11.5–15.5)

## 2013-01-11 MED ORDER — HYDROCODONE-ACETAMINOPHEN 5-325 MG PO TABS: 1.0000 | ORAL_TABLET | Freq: Four times a day (QID) | ORAL | Status: DC | PRN

## 2013-01-11 NOTE — Progress Notes (Signed)
Physical Therapy Treatment Patient Details Name: Kim Castro MRN: [id number] DOB: [birth year] Today's Date: 01/11/2013 Time: 4098-1191 PT Time Calculation (min): 26 min  PT Assessment / Plan / Recommendation  History of Present Illness Pt. admitted for elective R TKA.  Had left knee done earlier this summer   PT Comments   Excellent progress with ambulation this pm.  Headache gone, just light sensitivity present.  Lights dimmed in room following session.  Follow Up Recommendations  Home health PT;Supervision/Assistance - 24 hour;Supervision for mobility/OOB     Does the patient have the potential to tolerate intense rehabilitation     Barriers to Discharge        Equipment Recommendations  None recommended by PT    Recommendations for Other Services    Frequency 7X/week   Progress towards PT Goals Progress towards PT goals: Progressing toward goals (good progress this pm)  Plan Current plan remains appropriate    Precautions / Restrictions Precautions Precautions: Knee Precaution Booklet Issued: Yes (comment) Precaution Comments: educated on no pillow under R knee Restrictions Weight Bearing Restrictions: Yes RLE Weight Bearing: Weight bearing as tolerated   Pertinent Vitals/Pain See vitals tab     Mobility  Bed Mobility Bed Mobility: Supine to Sit;Sitting - Scoot to Edge of Bed;Sit to Supine Supine to Sit: 5: Supervision;HOB flat Sitting - Scoot to Edge of Bed: 5: Supervision Sit to Supine: 4: Min guard Details for Bed Mobility Assistance: Pt. used bed sheet to manage LE out of and into bed.  She needed supervision to min guard assist level to safely make transitions. Transfers Transfers: Sit to Stand;Stand to Sit Sit to Stand: 4: Min guard;From bed;From chair/3-in-1;With upper extremity assist;With armrests Stand to Sit: 4: Min guard;To chair/3-in-1;To bed;With upper extremity assist;With armrests Details for Transfer Assistance: demonstrating good technique;  min guard for safety Ambulation/Gait Ambulation/Gait Assistance: 4: Min guard Ambulation Distance (Feet): 150 Feet Assistive device: Rolling walker Ambulation/Gait Assistance Details: good technique with slow pace requiring extra time Gait Pattern: Step-to pattern Gait velocity: decreased        PT Diagnosis:    PT Problem List:   PT Treatment Interventions:     PT Goals (current goals can now be found in the care plan section) Acute Rehab PT Goals Patient Stated Goal: home  Visit Information  Last PT Received On: 01/11/13 Assistance Needed: +1 History of Present Illness: Pt. admitted for elective R TKA.  Had left knee doen earlier this summer    Subjective Data  Subjective: Pt. reports the headache has subsided, but she still is sensistive to bright lights out in the hallway Patient Stated Goal: home   Cognition  Cognition Arousal/Alertness: Awake/alert Behavior During Therapy: WFL for tasks assessed/performed Overall Cognitive Status: Within Functional Limits for tasks assessed    Balance     End of Session PT - End of Session Equipment Utilized During Treatment: Gait belt Activity Tolerance: Patient tolerated treatment well Patient left: in bed;with call bell/phone within reach;with family/visitor present Nurse Communication: Mobility status CPM Right Knee CPM Right Knee: Off   GP     Ferman Hamming 01/11/2013, 3:31 PM Weldon Picking PT Acute Rehab Services [phone] Beeper [phone]

## 2013-01-11 NOTE — Progress Notes (Signed)
PATIENT ID: Kim Castro  MRN: [id number]  DOB/AGE:  [birth year] / 67 y.o.  2 Days Post-Op Procedure(s) (LRB): TOTAL KNEE ARTHROPLASTY (Right)    PROGRESS NOTE Subjective: Patient is alert, oriented, no Nausea, no Vomiting, yes passing gas, no Bowel Movement. Taking PO well, pt has some nausea. Denies SOB, Chest or Calf Pain. Using Incentive Spirometer, PAS in place. Ambulate WBAT, pt walking to bathroom, CPM 0-60 Patient reports pain as moderate  .    Objective: Vital signs in last 24 hours: Filed Vitals:   01/10/13 1431 01/10/13 1600 01/10/13 2045 01/11/13 0516  BP: 138/48  134/49 149/53  Pulse: 74  81 100  Temp: 98.7 F (37.1 C)  99.3 F (37.4 C) 99.4 F (37.4 C)  TempSrc:   Oral Oral  Resp: 18 18 18 18   Height:      Weight:      SpO2: 98%  93% 94%      Intake/Output from previous day: I/O last 3 completed shifts: In: 4247.5 [P.O.:1560; I.V.:2687.5] Out: 1215 [Urine:1025; Drains:190]   Intake/Output this shift:     LABORATORY DATA:  Recent Labs  01/10/13 0545 01/11/13 0527  WBC 14.7* 11.6*  HGB 10.6* 10.5*  HCT 32.4* 32.3*  PLT 291 289    Examination: Neurologically intact Neurovascular intact Sensation intact distally Intact pulses distally Dorsiflexion/Plantar flexion intact Incision: no drainage}  Assessment:   2 Days Post-Op Procedure(s) (LRB): TOTAL KNEE ARTHROPLASTY (Right) ADDITIONAL DIAGNOSIS:  Hypertension  Plan: PT/OT WBAT, CPM 5/hrs day until ROM 0-90 degrees, then D/C CPM DVT Prophylaxis:  SCDx72hrs, ASA 325 mg BID x 2 weeks DISCHARGE PLAN: Home when pt passes PT. DISCHARGE NEEDS: HHPT, HHRN, CPM, Walker and 3-in-1 comode seat     Kim Castro R 01/11/2013, 7:10 AM

## 2013-01-11 NOTE — Progress Notes (Addendum)
Physical Therapy Treatment Patient Details Name: Kim Castro MRN: [id number] DOB: [birth year] Today's Date: 01/11/2013 Time: 1120-1150 PT Time Calculation (min): 30 min  PT Assessment / Plan / Recommendation  History of Present Illness Pt. admitted for elective R TKA.  Had left knee doen earlier this summer   PT Comments   Pt. Making gradual gains and is still limited by headache.   She still keeps room darkened with no lights and with door closed.  Discussed pt's headache with Adreinne RN.  Will attempt to see again this pm but doubt she will be ready to DC home this afternoon due to her rate of progress.  Pt. Not yet ready to practice steps.  Follow Up Recommendations  Home health PT;Supervision/Assistance - 24 hour;Supervision for mobility/OOB     Does the patient have the potential to tolerate intense rehabilitation     Barriers to Discharge        Equipment Recommendations  None recommended by PT    Recommendations for Other Services    Frequency 7X/week   Progress towards PT Goals Progress towards PT goals: Progressing toward goals  Plan Current plan remains appropriate    Precautions / Restrictions Precautions Precautions: Knee Precaution Booklet Issued: Yes (comment) Precaution Comments: educated on no pillow under R knee Restrictions Weight Bearing Restrictions: Yes RLE Weight Bearing: Weight bearing as tolerated   Pertinent Vitals/Pain See vitals tab; pt. Also complains of headache but does not rate pain. She was able to participate in PT despite headache but it has slowed her progress.    Mobility  Bed Mobility Bed Mobility: Supine to Sit;Sitting - Scoot to Edge of Bed Supine to Sit: 5: Supervision;HOB flat Sitting - Scoot to Edge of Bed: 5: Supervision Details for Bed Mobility Assistance: Pt. instucte din use of bed sheet to assist herself in managing her R LE.  She needed supervision for safety and the bed sheet to manage R  LE. Transfers Transfers: Sit  to Stand;Stand to Sit Sit to Stand: 4: Min assist;From bed;With upper extremity assist Stand to Sit: 4: Min assist;To chair/3-in-1;With upper extremity assist;With armrests Details for Transfer Assistance: cues for hand placement and technique, reminder to move R LE forward before sitting Ambulation/Gait Ambulation/Gait Assistance: 4: Min guard Ambulation Distance (Feet): 70 Feet (35 x 2) Assistive device: Rolling walker Ambulation/Gait Assistance Details: cues for correct RW placement in relation to her step length, ,min guard assist for safety and stability Gait Pattern: Step-to pattern Gait velocity: decreased    Exercises Total Joint Exercises Ankle Circles/Pumps: AROM;Both;10 reps Quad Sets: AROM;Right;10 reps;Seated Short Arc Quad: AROM;Right;10 reps;Seated Knee Flexion: AROM;AAROM;Right;5 reps;Seated Goniometric ROM: -5 to 60   PT Diagnosis:    PT Problem List:   PT Treatment Interventions:     PT Goals (current goals can now be found in the care plan section) Acute Rehab PT Goals Patient Stated Goal: home  Visit Information  Last PT Received On: 01/11/13 Assistance Needed: +1 History of Present Illness: Pt. admitted for elective R TKA.  Had left knee doen earlier this summer    Subjective Data  Subjective: Pt. reports the nausea is gone but she still has a headache.  She says she is sensitive to light and all the sounds in the hospital.  discussed this with pt's RN Hansel Starling. Patient Stated Goal: home   Cognition  Cognition Arousal/Alertness: Awake/alert Behavior During Therapy: WFL for tasks assessed/performed Overall Cognitive Status: Within Functional Limits for tasks assessed    Balance  End of Session PT - End of Session Equipment Utilized During Treatment: Gait belt Activity Tolerance: Patient tolerated treatment well;Other (comment);Treatment limited secondary to medical complications (Comment) (persistent headache) Patient left: in chair;with call  bell/phone within reach;with family/visitor present Nurse Communication: Mobility status CPM Right Knee CPM Right Knee: Off   GP     Ferman Hamming 01/11/2013, 12:17 PM Weldon Picking PT Acute Rehab Services [phone] [facility] [phone]

## 2013-01-11 NOTE — Progress Notes (Signed)
Occupational Therapy Discharge Patient Details Name: Kim Castro MRN: [id number] DOB: 1945-10-24 Today's Date: 01/11/2013 Time:  -     Patient discharged from OT services secondary to pt reports she does not have any further OT needs, can manage her own BADLs due to she had the other knee done earlier this year..  Please see latest therapy progress note for current level of functioning and progress toward goals.    Progress and discharge plan discussed with patient and/or caregiver: Patient/Caregiver agrees with plan       Evette Georges [phone] 01/11/2013, 3:48 PM

## 2013-01-11 NOTE — Discharge Summary (Signed)
Patient ID: Kim Castro MRN: [id number] DOB/AGE: 1945/07/31 67 y.o.  Admit date: 01/09/2013 Discharge date: 01/11/2013  Admission Diagnoses:  Principal Problem:   Arthritis of knee, right   Discharge Diagnoses:  Same  Past Medical History  Diagnosis Date  . Arthritis   . Hypertension     takes Losartan daily and HCTZ  . Family history of anesthesia complication     sister slow to wake up with anesthesia  . History of bronchitis     > 15yrs ago  . Seasonal allergies     takes Claritin prn  . Dizziness     > 70yrs ago;took Antivert   . Numbness     to toes on each foot  . Joint pain   . Vitamin D deficiency     takes VIt D daily  . GERD (gastroesophageal reflux disease)     takes occasional TUMs  . History of colon polyps   . Diverticulosis   . Urinary frequency   . Cataract     immature on the left    Surgeries: Procedure(s): TOTAL KNEE ARTHROPLASTY on 01/09/2013   Consultants:    Discharged Condition: Improved  Hospital Course: Kim Castro is an 67 y.o. female who was admitted 01/09/2013 for operative treatment ofArthritis of knee, right. Patient has severe unremitting pain that affects sleep, daily activities, and work/hobbies. After pre-op clearance the patient was taken to the operating room on 01/09/2013 and underwent  Procedure(s): TOTAL KNEE ARTHROPLASTY.    Patient was given perioperative antibiotics: Anti-infectives   Start     Dose/Rate Route Frequency Ordered Stop   01/09/13 1254  cefUROXime (ZINACEF) injection  Status:  Discontinued       As needed 01/09/13 1257 01/09/13 1433   01/09/13 0600  ceFAZolin (ANCEF) IVPB 2 g/50 mL premix     2 g 100 mL/hr over 30 Minutes Intravenous On call to O.R. 01/08/13 1446 01/09/13 1211       Patient was given sequential compression devices, early ambulation, and chemoprophylaxis to prevent DVT.  Patient benefited maximally from hospital stay and there were no complications.    Recent vital signs: Patient  Vitals for the past 24 hrs:  BP Temp Temp src Pulse Resp SpO2  01/11/13 0516 149/53 mmHg 99.4 F (37.4 C) Oral 100 18 94 %  01/10/13 2045 134/49 mmHg 99.3 F (37.4 C) Oral 81 18 93 %  01/10/13 1600 - - - - 18 -  01/10/13 1431 138/48 mmHg 98.7 F (37.1 C) - 74 18 98 %  01/10/13 1200 - - - - 18 -  01/10/13 1016 114/83 mmHg 98.6 F (37 C) Oral 70 16 98 %  01/10/13 0800 - - - - 18 -     Recent laboratory studies:  Recent Labs  01/10/13 0545 01/11/13 0527  WBC 14.7* 11.6*  HGB 10.6* 10.5*  HCT 32.4* 32.3*  PLT 291 289     Discharge Medications:     Medication List         aspirin EC 325 MG tablet  Take 1 tablet (325 mg total) by mouth 2 (two) times daily.     hydrochlorothiazide 25 MG tablet  Commonly known as:  HYDRODIURIL  Take 25 mg by mouth daily.     HYDROcodone-acetaminophen 5-325 MG per tablet  Commonly known as:  NORCO  Take 1 tablet by mouth every 6 (six) hours as needed for pain.     losartan 25 MG tablet  Commonly known as:  COZAAR  Take 25 mg by mouth daily.     methocarbamol 500 MG tablet  Commonly known as:  ROBAXIN  Take 1 tablet (500 mg total) by mouth 2 (two) times daily with a meal.     multivitamin with minerals tablet  Take 1 tablet by mouth daily.     oxyCODONE-acetaminophen 5-325 MG per tablet  Commonly known as:  ROXICET  Take 1 tablet by mouth every 4 (four) hours as needed for pain.     RED YEAST RICE PO  Take 2 capsules by mouth daily.     Vitamin D 2000 UNITS Caps  Take 1 capsule by mouth daily.        Diagnostic Studies: No results found.  Disposition: 01-Home or Self Care      Discharge Orders   Future Orders Complete By Expires   Call MD / Call 911  As directed    Comments:     If you experience chest pain or shortness of breath, CALL 911 and be transported to the hospital emergency room.  If you develope a fever above 101 F, pus (white drainage) or increased drainage or redness at the wound, or calf pain, call your  surgeon's office.   Change dressing  As directed    Comments:     Change dressing on 5, then change the dressing daily with sterile 4 x 4 inch gauze dressing and apply TED hose.  You may clean the incision with alcohol prior to redressing.   Constipation Prevention  As directed    Comments:     Drink plenty of fluids.  Prune juice may be helpful.  You may use a stool softener, such as Colace (over the counter) 100 mg twice a day.  Use MiraLax (over the counter) for constipation as needed.   CPM  As directed    Comments:     Continuous passive motion machine (CPM):      Use the CPM from 0 to 60  for 5 hours per day.      You may increase by 10 degrees per day.  You may break it up into 2 or 3 sessions per day.      Use CPM for 2 weeks or until you are told to stop.   Diet - low sodium heart healthy  As directed    Discharge instructions  As directed    Comments:     Follow up in 2 weeks with Dr. Turner Daniels   Driving restrictions  As directed    Comments:     No driving for 2 weeks   Increase activity slowly as tolerated  As directed    Patient may shower  As directed    Comments:     You may shower without a dressing once there is no drainage.  Do not wash over the wound.  If drainage remains, cover wound with plastic wrap and then shower.      Follow-up Information   Follow up with Nestor Lewandowsky, MD In 2 weeks. [facility] [phone] home therapy and RN)    Specialty:  Orthopedic Surgery   Contact information:   [address] [place] [address] [phone]        Signed: Vear Clock Jahzeel Poythress R 01/11/2013, 7:18 AM

## 2013-01-12 LAB — CBC
HCT: 32.1 % — ABNORMAL LOW (ref 36.0–46.0)
Hemoglobin: 10.3 g/dL — ABNORMAL LOW (ref 12.0–15.0)
MCH: 30.9 pg (ref 26.0–34.0)
MCV: 96.4 fL (ref 78.0–100.0)
Platelets: 256 10*3/uL (ref 150–400)
RBC: 3.33 MIL/uL — ABNORMAL LOW (ref 3.87–5.11)

## 2013-01-12 NOTE — Progress Notes (Signed)
Patient left unit at 1145 via wheelchair with family. Discharge instructions given and understood.

## 2013-01-12 NOTE — Progress Notes (Signed)
Physical Therapy Treatment Patient Details Name: Kim Castro MRN: [id number] DOB: May 22, 1945 Today's Date: 01/12/2013 Time: 0865-7846 PT Time Calculation (min): 33 min  PT Assessment / Plan / Recommendation  History of Present Illness Pt. admitted for elective R TKA.  Had left knee doen earlier this summer   PT Comments   Pt moving well.  Increased ambulation distance & performed stairs this session.    Follow Up Recommendations  Home health PT;Supervision/Assistance - 24 hour;Supervision for mobility/OOB     Does the patient have the potential to tolerate intense rehabilitation     Barriers to Discharge        Equipment Recommendations  None recommended by PT    Recommendations for Other Services    Frequency 7X/week   Progress towards PT Goals Progress towards PT goals: Progressing toward goals  Plan Current plan remains appropriate    Precautions / Restrictions Precautions Precautions: Knee Restrictions RLE Weight Bearing: Weight bearing as tolerated   Pertinent Vitals/Pain 5/10 Rt knee with activity. RN administered pain medication.      Mobility  Bed Mobility Bed Mobility: Supine to Sit;Sitting - Scoot to Edge of Bed Supine to Sit: HOB flat;4: Min assist Sitting - Scoot to Edge of Bed: 6: Modified independent (Device/Increase time) Details for Bed Mobility Assistance: Incr time & min assist to bring shoulders/trunk to sitting upright Transfers Transfers: Sit to Stand;Stand to Sit Sit to Stand: 5: Supervision;With upper extremity assist;With armrests;From bed;From chair/3-in-1 Stand to Sit: 5: Supervision;With upper extremity assist;With armrests;To chair/3-in-1 Ambulation/Gait Ambulation/Gait Assistance: 5: Supervision Ambulation Distance (Feet): 400 Feet Assistive device: Rolling walker Ambulation/Gait Assistance Details: demonstrates safe technique.  Encouraged step through gait pattern Gait Pattern: Step-to pattern;Step-through pattern;Decreased stance  time - right;Decreased step length - left Gait velocity: decreased Stairs: Yes Stairs Assistance: 4: Min assist Stairs Assistance Details (indicate cue type and reason): (A) to stabilize & manage RW.  Cues for sequencing & technique.  Stair Management Technique: No rails;Step to pattern;Backwards;With walker Number of Stairs: 3 Wheelchair Mobility Wheelchair Mobility: No      PT Goals (current goals can now be found in the care plan section) Acute Rehab PT Goals Patient Stated Goal: home PT Goal Formulation: With patient Time For Goal Achievement: 01/17/13 Potential to Achieve Goals: Good  Visit Information  Last PT Received On: 01/12/13 Assistance Needed: +1 History of Present Illness: Pt. admitted for elective R TKA.  Had left knee doen earlier this summer    Subjective Data  Patient Stated Goal: home   Cognition  Cognition Arousal/Alertness: Awake/alert Behavior During Therapy: WFL for tasks assessed/performed Overall Cognitive Status: Within Functional Limits for tasks assessed    Balance     End of Session PT - End of Session Equipment Utilized During Treatment: Gait belt Activity Tolerance: Patient tolerated treatment well Patient left: in chair;with call bell/phone within reach Nurse Communication: Mobility status   GP     Lara Mulch 01/12/2013, 2:30 PM  Verdell Face, PTA [phone] 01/12/2013

## 2013-01-12 NOTE — Progress Notes (Signed)
PATIENT ID: Kim Castro  MRN: [id number]  DOB/AGE:  March 06, 1946 / 67 y.o.  3 Days Post-Op Procedure(s) (LRB): TOTAL KNEE ARTHROPLASTY (Right)    PROGRESS NOTE Subjective: Patient is alert, oriented, no Nausea, no Vomiting, yes passing gas, no Bowel Movement. Taking PO well, pt has some nausea. Denies SOB, Chest or Calf Pain. Using Incentive Spirometer, PAS in place. Ambulate WBAT Patient reports pain as moderate  .    Objective: Vital signs in last 24 hours: Filed Vitals:   01/11/13 0800 01/11/13 1314 01/11/13 2134 01/12/13 0622  BP:  120/49 146/50 133/56  Pulse:  97 90 86  Temp:  99.1 F (37.3 C) 99.2 F (37.3 C) 98.2 F (36.8 C)  TempSrc:   Oral Oral  Resp: 18 18 16 16   Height:      Weight:      SpO2: 95% 97% 95% 95%      Intake/Output from previous day: I/O last 3 completed shifts: In: 1940 [P.O.:1440; I.V.:500] Out: 500 [Urine:500]   Intake/Output this shift: Total I/O In: -  Out: 300 [Urine:300]   LABORATORY DATA:  Recent Labs  01/11/13 0527 01/12/13 0412  WBC 11.6* 9.1  HGB 10.5* 10.3*  HCT 32.3* 32.1*  PLT 289 256    Examination: Neurologically intact Neurovascular intact Sensation intact distally Intact pulses distally Dorsiflexion/Plantar flexion intact Incision: no drainage}  Assessment:   3 Days Post-Op Procedure(s) (LRB): TOTAL KNEE ARTHROPLASTY (Right) ADDITIONAL DIAGNOSIS:  Hypertension  Plan: PT/OT WBAT, CPM 5/hrs day until ROM 0-90 degrees, then D/C CPM DVT Prophylaxis:  SCDx72hrs, ASA 325 mg BID x 2 weeks DISCHARGE PLAN: Home when pt passes PT. DISCHARGE NEEDS: HHPT, HHRN, CPM, Walker and 3-in-1 comode seat     Jurgen Groeneveld A. 01/12/2013, 7:56 AM

## 2013-01-12 NOTE — Progress Notes (Signed)
PT worked with patient and said she is good to go home. Patient going home with spouse.

## 2013-01-23 NOTE — Progress Notes (Signed)
Clinical Social Worker will sign off for now as social work intervention is no longer needed. Please consult us again if new need arises.   Chi Garlow, MSW, LCSWA [phone] 

## 2013-02-18 ENCOUNTER — Other Ambulatory Visit: Payer: Self-pay

## 2013-02-18 DIAGNOSIS — Z1231 Encounter for screening mammogram for malignant neoplasm of breast: Secondary | ICD-10-CM

## 2013-03-14 ENCOUNTER — Ambulatory Visit
Admission: RE | Admit: 2013-03-14 | Discharge: 2013-03-14 | Disposition: A | Discharge status: Home / Self Care | Payer: BC Managed Care – PPO | Source: Ambulatory Visit

## 2013-03-14 DIAGNOSIS — Z1231 Encounter for screening mammogram for malignant neoplasm of breast: Secondary | ICD-10-CM

## 2014-05-07 ENCOUNTER — Other Ambulatory Visit: Payer: Self-pay

## 2014-05-07 DIAGNOSIS — Z1231 Encounter for screening mammogram for malignant neoplasm of breast: Secondary | ICD-10-CM

## 2014-05-13 ENCOUNTER — Ambulatory Visit
Admission: RE | Admit: 2014-05-13 | Discharge: 2014-05-13 | Disposition: A | Discharge status: Home / Self Care | Payer: Medicare Other | Source: Ambulatory Visit

## 2014-05-13 DIAGNOSIS — Z1231 Encounter for screening mammogram for malignant neoplasm of breast: Secondary | ICD-10-CM

## 2014-05-14 ENCOUNTER — Other Ambulatory Visit: Payer: Self-pay | Admitting: Family Medicine

## 2014-05-14 DIAGNOSIS — R928 Other abnormal and inconclusive findings on diagnostic imaging of breast: Secondary | ICD-10-CM

## 2014-05-26 ENCOUNTER — Ambulatory Visit
Admission: RE | Admit: 2014-05-26 | Discharge: 2014-05-26 | Disposition: A | Discharge status: Home / Self Care | Payer: Medicare Other | Source: Ambulatory Visit | Attending: Family Medicine | Admitting: Family Medicine

## 2014-05-26 ENCOUNTER — Other Ambulatory Visit: Payer: Self-pay | Admitting: Family Medicine

## 2014-05-26 DIAGNOSIS — R921 Mammographic calcification found on diagnostic imaging of breast: Secondary | ICD-10-CM

## 2014-05-26 DIAGNOSIS — R928 Other abnormal and inconclusive findings on diagnostic imaging of breast: Secondary | ICD-10-CM

## 2014-05-27 ENCOUNTER — Other Ambulatory Visit: Payer: Self-pay | Admitting: Family Medicine

## 2014-05-27 DIAGNOSIS — R921 Mammographic calcification found on diagnostic imaging of breast: Secondary | ICD-10-CM

## 2014-05-29 ENCOUNTER — Ambulatory Visit
Admission: RE | Admit: 2014-05-29 | Discharge: 2014-05-29 | Disposition: A | Discharge status: Home / Self Care | Payer: Medicare Other | Source: Ambulatory Visit | Attending: Family Medicine | Admitting: Family Medicine

## 2014-05-29 DIAGNOSIS — R921 Mammographic calcification found on diagnostic imaging of breast: Secondary | ICD-10-CM

## 2014-06-02 ENCOUNTER — Other Ambulatory Visit: Payer: Self-pay | Admitting: Family Medicine

## 2014-06-02 DIAGNOSIS — C50912 Malignant neoplasm of unspecified site of left female breast: Secondary | ICD-10-CM

## 2014-06-03 ENCOUNTER — Telehealth: Payer: Self-pay | Admitting: *Deleted

## 2014-06-03 NOTE — Telephone Encounter (Signed)
Left vm for pt to return call to get scheduled for [facility] on 06/11/14.

## 2014-06-04 ENCOUNTER — Telehealth: Payer: Self-pay | Admitting: *Deleted

## 2014-06-04 ENCOUNTER — Encounter: Payer: Self-pay | Admitting: *Deleted

## 2014-06-04 DIAGNOSIS — C50412 Malignant neoplasm of upper-outer quadrant of left female breast: Secondary | ICD-10-CM

## 2014-06-04 DIAGNOSIS — C50919 Malignant neoplasm of unspecified site of unspecified female breast: Secondary | ICD-10-CM | POA: Insufficient documentation

## 2014-06-04 HISTORY — DX: Malignant neoplasm of upper-outer quadrant of left female breast: C50.412

## 2014-06-04 NOTE — Telephone Encounter (Signed)
Confirmed [facility] for 06/11/14 at 0800 .  Instructions and contact information given.

## 2014-06-10 ENCOUNTER — Ambulatory Visit: Payer: Medicare Other

## 2014-06-10 ENCOUNTER — Ambulatory Visit
Admission: RE | Admit: 2014-06-10 | Discharge: 2014-06-10 | Disposition: A | Discharge status: Home / Self Care | Payer: Medicare Other | Source: Ambulatory Visit | Attending: Family Medicine | Admitting: Family Medicine

## 2014-06-10 DIAGNOSIS — C50912 Malignant neoplasm of unspecified site of left female breast: Secondary | ICD-10-CM

## 2014-06-10 MED ORDER — GADOBENATE DIMEGLUMINE 529 MG/ML IV SOLN
20.0000 mL | Freq: Once | INTRAVENOUS | Status: AC | PRN
Start: 2014-06-10 — End: 2014-06-10
  Administered 2014-06-10: 20 mL via INTRAVENOUS

## 2014-06-11 ENCOUNTER — Ambulatory Visit (HOSPITAL_BASED_OUTPATIENT_CLINIC_OR_DEPARTMENT_OTHER): Payer: Medicare Other | Admitting: Hematology and Oncology

## 2014-06-11 ENCOUNTER — Encounter: Payer: Self-pay | Admitting: *Deleted

## 2014-06-11 ENCOUNTER — Encounter: Payer: Self-pay | Admitting: Skilled Nursing Facility1

## 2014-06-11 ENCOUNTER — Encounter: Payer: Self-pay | Admitting: Hematology and Oncology

## 2014-06-11 ENCOUNTER — Ambulatory Visit: Payer: Medicare Other | Admitting: Physical Therapy

## 2014-06-11 ENCOUNTER — Ambulatory Visit
Admission: RE | Admit: 2014-06-11 | Discharge: 2014-06-11 | Disposition: A | Discharge status: Home / Self Care | Payer: Medicare Other | Source: Ambulatory Visit | Attending: Radiation Oncology | Admitting: Radiation Oncology

## 2014-06-11 ENCOUNTER — Other Ambulatory Visit (HOSPITAL_BASED_OUTPATIENT_CLINIC_OR_DEPARTMENT_OTHER): Payer: Medicare Other

## 2014-06-11 ENCOUNTER — Other Ambulatory Visit (INDEPENDENT_AMBULATORY_CARE_PROVIDER_SITE_OTHER): Payer: Self-pay | Admitting: General Surgery

## 2014-06-11 ENCOUNTER — Ambulatory Visit: Payer: Medicare Other

## 2014-06-11 VITALS — BP 132/67 | HR 84 | Temp 98.4°F | Resp 18 | Ht 65.0 in | Wt 242.6 lb

## 2014-06-11 DIAGNOSIS — Z808 Family history of malignant neoplasm of other organs or systems: Secondary | ICD-10-CM

## 2014-06-11 DIAGNOSIS — C50412 Malignant neoplasm of upper-outer quadrant of left female breast: Secondary | ICD-10-CM

## 2014-06-11 DIAGNOSIS — Z803 Family history of malignant neoplasm of breast: Secondary | ICD-10-CM

## 2014-06-11 DIAGNOSIS — D241 Benign neoplasm of right breast: Secondary | ICD-10-CM

## 2014-06-11 DIAGNOSIS — Z800 Family history of malignant neoplasm of digestive organs: Secondary | ICD-10-CM

## 2014-06-11 DIAGNOSIS — D0512 Intraductal carcinoma in situ of left breast: Secondary | ICD-10-CM

## 2014-06-11 LAB — COMPREHENSIVE METABOLIC PANEL (CC13)
ALBUMIN: 4 g/dL (ref 3.5–5.0)
ALT: 33 U/L (ref 0–55)
AST: 32 U/L (ref 5–34)
Alkaline Phosphatase: 83 U/L (ref 40–150)
Anion Gap: 12 mEq/L — ABNORMAL HIGH (ref 3–11)
BUN: 17.7 mg/dL (ref 7.0–26.0)
CALCIUM: 9.9 mg/dL (ref 8.4–10.4)
CHLORIDE: 101 meq/L (ref 98–109)
CO2: 29 meq/L (ref 22–29)
CREATININE: 0.8 mg/dL (ref 0.6–1.1)
EGFR: 78 mL/min/{1.73_m2} — ABNORMAL LOW (ref >90)
GLUCOSE: 148 mg/dL — AB (ref 70–140)
Potassium: 4 mEq/L (ref 3.5–5.1)
Sodium: 142 mEq/L (ref 136–145)
Total Bilirubin: 0.86 mg/dL (ref 0.20–1.20)
Total Protein: 7.5 g/dL (ref 6.4–8.3)

## 2014-06-11 LAB — CBC WITH DIFFERENTIAL/PLATELET
BASO%: 0.4 % (ref 0.0–2.0)
BASOS ABS: 0 10*3/uL (ref 0.0–0.1)
EOS%: 1.6 % (ref 0.0–7.0)
Eosinophils Absolute: 0.2 10*3/uL (ref 0.0–0.5)
HCT: 41.2 % (ref 34.8–46.6)
HGB: 13.7 g/dL (ref 11.6–15.9)
LYMPH%: 32.6 % (ref 14.0–49.7)
MCH: 32.4 pg (ref 25.1–34.0)
MCHC: 33.3 g/dL (ref 31.5–36.0)
MCV: 97.4 fL (ref 79.5–101.0)
MONO#: 0.7 10*3/uL (ref 0.1–0.9)
MONO%: 6.9 % (ref 0.0–14.0)
NEUT#: 5.6 10*3/uL (ref 1.5–6.5)
NEUT%: 58.5 % (ref 38.4–76.8)
Platelets: 353 10*3/uL (ref 145–400)
RBC: 4.23 10*6/uL (ref 3.70–5.45)
RDW: 13.4 % (ref 11.2–14.5)
WBC: 9.6 10*3/uL (ref 3.9–10.3)
lymph#: 3.1 10*3/uL (ref 0.9–3.3)

## 2014-06-11 NOTE — Progress Notes (Signed)
[name] is a very pleasant 69 y.o. 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  Biopsy results revealed the tumor's hormone status as ER positive.   She presents today with her daughter the [facility]) for treatment consideration and recommendations from the breast surgeon, radiation oncologist, and medical oncologist.     I briefly met with [name] and her daughter during her [facility] visit today. We discussed the purpose of the Survivorship Clinic, which will include monitoring for recurrence, coordinating completion of age and gender-appropriate cancer screenings, promotion of overall wellness, as well as managing potential late/long-term side effects of anti-cancer treatments.    As of today, the treatment plan for [name] will likely include surgery and radiation therapy.  She will meet with the Genetics Counselor due to her family history of breast cancer. Anti-estrogen treatment will be considered for her as well. The intent of treatment for [name] is cure, therefore she will be eligible for the Survivorship Clinic upon her completion of treatment.  Her survivorship care plan (SCP) document will be drafted and updated throughout the course of her treatment trajectory. She will receive the SCP in an office visit with myself in the Survivorship Clinic once she has completed treatment.   [name] was encouraged to ask questions and all questions were answered to her satisfaction.  She was given my business card and encouraged to contact me with any concerns regarding survivorship.  I look forward to participating in her care.   Mike Craze, NP [place] [phone]

## 2014-06-11 NOTE — Progress Notes (Signed)
  Radiation Oncology         [phone]) [phone] ________________________________  Initial outpatient Consultation - Date: 06/11/2014   Name: Kim Castro MRN: [id number]   DOB: 10/18/1945  REFERRING PHYSICIAN: Excell Seltzer, MD  DIAGNOSIS: DCIS of the left breast (Stage 0)  HISTORY OF PRESENT ILLNESS::Kim Castro is a 69 y.o. female  Who was found to have calcifications in the upper outer quadrant of the left breast on screening mammogram. These measured 1.5 cm. A biopsy showed low to intermediate grade DCIS which was ER+PR+. MRI confirmed a 2.4 x 1.3 x 1.1 cm area of abnormality. In the right breast a biopsy clip was seen associated with a mass that has been biopsied and is a fibroadenoma. She has a sister with breast cancer at 68. She is accompanied by her daughter. She is GxP3 with her first live birth at 48. She did not take HRT and had her last period in 2000. Menses at 13.   PREVIOUS RADIATION THERAPY: No  Past medical, social and family history were reviewed in the electronic chart. Review of symptoms was reviewed in the electronic chart. Medications were reviewed in the electronic chart.   PHYSICAL EXAM: There were no vitals filed for this visit.. . Pleasant female in no distress   IMPRESSION: DCIS of the left breast.   PLAN: I spoke to the patient today regarding her diagnosis and options for treatment. We discussed the equivalence in terms of survival and local failure between mastectomy and breast conservation. We discussed the role of radiation in decreasing local failures in patients who undergo lumpectomy. We discussed the process of simulation and the placement tattoos. We discussed 4-6 weeks of treatment as an outpatient. We discussed the possibility of asymptomatic lung damage. We discussed the low likelihood of secondary malignancies. We discussed the possible side effects including but not limited to skin redness, fatigue, permanent skin darkening, and breast swelling.      I will see her back after her surgery. She met with surgery, medical oncology and physical therapy. She also met with our dietician, social worker and survivorship navigator. She met with our breast cancer navigator and received a care plan.   I spent 60 minutes  face to face with the patient and more than 50% of that time was spent in counseling and/or coordination of care.   ------------------------------------------------  Thea Silversmith, MD

## 2014-06-11 NOTE — Progress Notes (Signed)
Subjective:     Patient ID: Kim Castro, female   DOB: 1946-01-15, 69 y.o.   MRN: [id number]  HPI   Review of Systems     Objective:   Physical Exam  For the patient to understand and be given the tools to implement a healthy plant based diet during their cancer diagnosis.    Assessment:     Patient was seen today and was found to be in good spirits and was accompanied by her daughter. Patient has left breast calcification. Patients current medications are: Vitamin D, Hydrochlorothiazide, Multi-vitamin, and red yeast rice (perscribed by her doctor for lowering cholesterol). Patient states she has lost six pounds by decreasing her carbohydrate foods and simple sugars. She has had her right and left knees replaced but patient states that does not limit her mobility and she can be physically active. She is currently 242 pounds at a height of 49'5'' and a BMI of 40.5 (06/11/14)    Plan:     Dietitian educated the patient on implementing a plant based diet by incorporating more plant proteins, fruits, and vegetables. As a part of a healthy routine physical activity was discussed.   The importance of utilizing legitimate, evidence based information was discussed and examples were given. A folder of evidence based information with a focus of a plant based diet and nutrition during cancer was given. Dietitian briefly discussed the diet and exercise in the context of diabetes.   As a part of the continuum of care the cancer dietitians contact information was given in the event the patient would like to have a follow up appointment.

## 2014-06-11 NOTE — Assessment & Plan Note (Signed)
Left breast low to intermediate grade DCIS with calcifications 2.4 cm by MRI which includes the seroma cavity, Right breast 1.2 cm previously biopsied (2009) stable fibroadenoma ER 100%, PR 96%  Radiology and Pathology counseling: Discussed with the patient, the details of pathology including the type of breast cancer,the clinical staging, the significance of ER, PR receptors and the implications for treatment. After reviewing the pathology in detail, we proceeded to discuss the different treatment options between surgery, radiation, and antiestrogen therapies.  Recommendation: Lumpectomy followed by radiation followed by antiestrogen therapy with anastrozole 5 years  Anastrozole counseling:We discussed the risks and benefits of anti-estrogen therapy with aromatase inhibitors. These include but not limited to insomnia, hot flashes, mood changes, vaginal dryness, bone density loss, and weight gain. Although rare, serious side effects including endometrial cancer, risk of blood clots were also discussed. We strongly believe that the benefits far outweigh the risks. Patient understands these risks and consented to starting treatment. Planned treatment duration is 5 years.  Return to clinic after surgery for follow-up to discuss final pathology and to determine subsequent treatment plan.

## 2014-06-11 NOTE — Progress Notes (Signed)
Checked in new pt with no financial concerns prior to seeing the dr.  Informed pt if chemo is part of her treatment Raquel will call her ins to see if auth is req and will obtain it if it is as well as contact foundations that offer copay assistance for chemo if needed.  She has Raquel's card for any billing questions or concerns. °

## 2014-06-11 NOTE — Progress Notes (Signed)
[place] NOTE  Patient Care Team: Hulan Fess, MD as PCP - General (Family Medicine) Excell Seltzer, MD as Consulting Physician (General Surgery) Rolm Bookbinder, MD as Consulting Physician (General Surgery) Thea Silversmith, MD as Consulting Physician (Radiation Oncology) Roselee Culver, RN as Registered Nurse [facility], RN as Registered Nurse Trinda Pascal, NP as Nurse Practitioner (Nurse Practitioner)  CHIEF COMPLAINTS/PURPOSE OF CONSULTATION:  Newly diagnosed breast cancer  HISTORY OF PRESENTING ILLNESS:  Kim Castro 69 y.o. female is here because of recent diagnosis of left breast cancer. She had a routine screening mammogram that revealed left breast calcifications in the upper outer quadrant the total span being 1.5 cm this was biopsied under ultrasound which came back as low to intermediate grade DCIS with calcifications that was ER/PR positive. She had a breast MRI that showed a 2.4 cm enhancement in the left breast. She was also noted to have an abnormality in the right breast which is felt to be stable. She has had 2 or 3 biopsies in the right breast apparently all showing benign fibroadenoma. She was presented this morning in the multidisciplinary tumor board and she is here today at the [facility] clinic to discuss a treatment plan. She is here today accompanied by her husband. She denies any pain or discomfort in the breast.  I reviewed her records extensively and collaborated the history with the patient.  SUMMARY OF ONCOLOGIC HISTORY:   Breast cancer of upper-outer quadrant of left female breast   06/04/2014 Initial Diagnosis Left breast biopsy: DCIS with calcifications, ER 100%, PR 96%   06/10/2014 Breast MRI Left breast: 2.4 x 1.3 x 1.1 cm area of patchy non-mass enhancement upper outer quadrant includes postbiopsy seroma; Right breast: 1.2 cm previously biopsied stable benign fibroadenoma    In terms of breast cancer  risk profile:  She menarched at early age of 78 and went to menopause at age 55  She had 3 pregnancy, her first child was born at age 63  She has received birth control pills for approximately 5 years.  She was never exposed to fertility medications or hormone replacement therapy.  She has  family history of Breast/GYN/GI cancer Mother had uterine cancer age 64, brother colon cancer age 53, sister breast cancer age 25  MEDICAL HISTORY:  Past Medical History  Diagnosis Date  . Arthritis   . Hypertension     takes Losartan daily and HCTZ  . Family history of anesthesia complication     sister slow to wake up with anesthesia  . History of bronchitis     > 46yrs ago  . Seasonal allergies     takes Claritin prn  . Dizziness     > 70yrs ago;took Antivert   . Numbness     to toes on each foot  . Joint pain   . Vitamin D deficiency     takes VIt D daily  . GERD (gastroesophageal reflux disease)     takes occasional TUMs  . History of colon polyps   . Diverticulosis   . Urinary frequency   . Cataract     immature on the left  . Breast cancer of upper-outer quadrant of left female breast 06/04/2014  . Diabetes mellitus without complication     SURGICAL HISTORY: Past Surgical History  Procedure Laterality Date  . Tubal ligation    . Total knee arthroplasty Left 10/24/2012    Procedure: TOTAL KNEE ARTHROPLASTY;  Surgeon: Kerin Salen, MD;  Location: [facility];  Service: Orthopedics;  Laterality: Left;  . Colonoscopy    . Cataract extraction Right   . Total knee arthroplasty Right 01/09/2013    Procedure: TOTAL KNEE ARTHROPLASTY;  Surgeon: Kerin Salen, MD;  Location: [facility];  Service: Orthopedics;  Laterality: Right;    SOCIAL HISTORY: History   Social History  . Marital Status: Married    Spouse Name: N/A  . Number of Children: N/A  . Years of Education: N/A   Occupational History  . Not on file.   Social History Main Topics  . Smoking status: Never Smoker   . Smokeless  tobacco: Not on file  . Alcohol Use: No  . Drug Use: No  . Sexual Activity: Yes   Other Topics Concern  . Not on file   Social History Narrative    FAMILY HISTORY: Family History  Problem Relation Age of Onset  . Uterine cancer Mother   . Breast cancer Sister   . Colon cancer Brother     ALLERGIES:  is allergic to oxycodone.  MEDICATIONS:  Current Outpatient Prescriptions  Medication Sig Dispense Refill  . aspirin EC 325 MG tablet Take 1 tablet (325 mg total) by mouth 2 (two) times daily. 30 tablet 0  . Cholecalciferol (VITAMIN D) 2000 UNITS CAPS Take 1 capsule by mouth daily.    . hydrochlorothiazide (HYDRODIURIL) 25 MG tablet Take 25 mg by mouth daily.    Marland Kitchen losartan (COZAAR) 25 MG tablet Take 25 mg by mouth daily.    . Multiple Vitamins-Minerals (MULTIVITAMIN WITH MINERALS) tablet Take 1 tablet by mouth daily.    . Red Yeast Rice Extract (RED YEAST RICE PO) Take 2 capsules by mouth daily.    Marland Kitchen HYDROcodone-acetaminophen (NORCO) 5-325 MG per tablet Take 1 tablet by mouth every 6 (six) hours as needed for pain. (Patient not taking: Reported on 06/11/2014) 60 tablet 0  . methocarbamol (ROBAXIN) 500 MG tablet Take 1 tablet (500 mg total) by mouth 2 (two) times daily with a meal. (Patient not taking: Reported on 06/11/2014) 60 tablet 0   No current facility-administered medications for this visit.    REVIEW OF SYSTEMS:   Constitutional: Denies fevers, chills or abnormal night sweats Eyes: Denies blurriness of vision, double vision or watery eyes Ears, nose, mouth, throat, and face: Denies mucositis or sore throat Respiratory: Denies cough, dyspnea or wheezes Cardiovascular: Denies palpitation, chest discomfort or lower extremity swelling Gastrointestinal:  Denies nausea, heartburn or change in bowel habits Skin: Denies abnormal skin rashes Lymphatics: Denies new lymphadenopathy or easy bruising Neurological:Denies numbness, tingling or new weaknesses Behavioral/Psych: Mood is  stable, no new changes  Breast:  Denies any palpable lumps or discharge All other systems were reviewed with the patient and are negative.  PHYSICAL EXAMINATION: ECOG PERFORMANCE STATUS: 0 - Asymptomatic  Filed Vitals:   06/11/14 0836  BP: 132/67  Pulse: 84  Temp: 98.4 F (36.9 C)  Resp: 18   Filed Weights   06/11/14 0836  Weight: 242 lb 9.6 oz (110.043 kg)    GENERAL:alert, no distress and comfortable SKIN: skin color, texture, turgor are normal, no rashes or significant lesions EYES: normal, conjunctiva are pink and non-injected, sclera clear OROPHARYNX:no exudate, no erythema and lips, buccal mucosa, and tongue normal  NECK: supple, thyroid normal size, non-tender, without nodularity LYMPH:  no palpable lymphadenopathy in the cervical, axillary or inguinal LUNGS: clear to auscultation and percussion with normal breathing effort HEART: regular rate & rhythm and no murmurs and  no lower extremity edema ABDOMEN:abdomen soft, non-tender and normal bowel sounds Musculoskeletal:no cyanosis of digits and no clubbing  PSYCH: alert & oriented x 3 with fluent speech NEURO: no focal motor/sensory deficits BREAST: No palpable nodules in breast. No palpable axillary or supraclavicular lymphadenopathy (exam performed in the presence of a chaperone)   LABORATORY DATA:  I have reviewed the data as listed Lab Results  Component Value Date   WBC 9.6 06/11/2014   HGB 13.7 06/11/2014   HCT 41.2 06/11/2014   MCV 97.4 06/11/2014   PLT 353 06/11/2014   Lab Results  Component Value Date   NA 142 06/11/2014   K 4.0 06/11/2014   CL 100 01/04/2013   CO2 29 06/11/2014    RADIOGRAPHIC STUDIES: I have personally reviewed the radiological reports and agreed with the findings in the report. Results summarized as above  ASSESSMENT AND PLAN:  Breast cancer of upper-outer quadrant of left female breast Left breast low to intermediate grade DCIS with calcifications 2.4 cm by MRI which  includes the seroma cavity, Right breast 1.2 cm previously biopsied (2009) stable fibroadenoma ER 100%, PR 96%  Radiology and Pathology counseling: Discussed with the patient, the details of pathology including the type of breast cancer,the clinical staging, the significance of ER, PR receptors and the implications for treatment. After reviewing the pathology in detail, we proceeded to discuss the different treatment options between surgery, radiation, and antiestrogen therapies.  Recommendation: Lumpectomy followed by radiation followed by antiestrogen therapy with anastrozole 5 years  Anastrozole counseling:We discussed the risks and benefits of anti-estrogen therapy with aromatase inhibitors. These include but not limited to insomnia, hot flashes, mood changes, vaginal dryness, bone density loss, and weight gain. Although rare, serious side effects including endometrial cancer, risk of blood clots were also discussed. We strongly believe that the benefits far outweigh the risks. Patient understands these risks and consented to starting treatment. Planned treatment duration is 5 years.  Return to clinic after surgery for follow-up to discuss final pathology and to determine subsequent treatment plan.  All questions were answered. The patient knows to call the clinic with any problems, questions or concerns.    Rulon Eisenmenger, MD 12:01 PM

## 2014-06-11 NOTE — Progress Notes (Signed)
Clinical Social Work [place] Psychosocial Distress Screening [place]  Patient completed distress screening protocol and scored a 0 on the Psychosocial Distress Thermometer which indicates no distress. Clinical Social Worker met with patient and patients daughter in [facility] to assess for distress and other psychosocial needs.  Patient stated she was doing well and felt comfortable with treatment plans after meeting with the multidisciplinary team.  CSW and patient discussed common feelings and concerns when being diagnosed with cancer.  CSW informed patient and family on the support team and support services at [facility] and encouraged patient to call with any questions or concerns.       ONCBCN DISTRESS SCREENING 06/11/2014  Screening Type Initial Screening  Distress experienced in past week (1-10) 0  Physician notified of physical symptoms Yes  Referral to clinical psychology No  Referral to clinical social work No  Referral to dietition No  Referral to financial advocate No  Referral to support programs No  Referral to palliative care No    Johnnye Lana, MSW, LCSW, OSW-C Clinical Social Worker [place] [phone]

## 2014-06-12 ENCOUNTER — Ambulatory Visit (HOSPITAL_BASED_OUTPATIENT_CLINIC_OR_DEPARTMENT_OTHER): Payer: Medicare Other | Admitting: Genetic Counselor

## 2014-06-12 ENCOUNTER — Other Ambulatory Visit: Payer: Medicare Other

## 2014-06-12 ENCOUNTER — Encounter: Payer: Self-pay | Admitting: Genetic Counselor

## 2014-06-12 DIAGNOSIS — C50412 Malignant neoplasm of upper-outer quadrant of left female breast: Secondary | ICD-10-CM

## 2014-06-12 DIAGNOSIS — Z800 Family history of malignant neoplasm of digestive organs: Secondary | ICD-10-CM | POA: Insufficient documentation

## 2014-06-12 DIAGNOSIS — Z8049 Family history of malignant neoplasm of other genital organs: Secondary | ICD-10-CM | POA: Insufficient documentation

## 2014-06-12 DIAGNOSIS — Z803 Family history of malignant neoplasm of breast: Secondary | ICD-10-CM | POA: Insufficient documentation

## 2014-06-12 NOTE — Progress Notes (Signed)
Patient Name: Kim Castro Patient Age: 69 y.o. Encounter Date: 06/12/2014  Referring Physician: [name], MD  Primary Care Provider: Mickie Hillier, MD   [name], a 69 y.o. female, is being seen at the [facility] due to a personal and family history of cancer. She presents to clinic today to discuss the possibility of a hereditary predisposition to cancer and discuss whether genetic testing is warranted.  HISTORY OF PRESENT ILLNESS: [name] was diagnosed with left breast cancer (DCIS) recently at the age of 55. She stated that she is planning on having a lumpectomy and receiving radiation.  The breast tumor was ER positive and PR positive.  [name] stated that her last colonoscopy in 2012 was negative for polyps. She has had 3 previous colonoscopies, but does not remember how many polyps were removed.    Breast cancer of upper-outer quadrant of left female breast   06/04/2014 Initial Diagnosis Left breast biopsy: DCIS with calcifications, ER 100%, PR 96%   06/10/2014 Breast MRI Left breast: 2.4 x 1.3 x 1.1 cm area of patchy non-mass enhancement upper outer quadrant includes postbiopsy seroma; Right breast: 1.2 cm previously biopsied stable benign fibroadenoma    Past Medical History  Diagnosis Date  . Arthritis   . Hypertension     takes Losartan daily and HCTZ  . Family history of anesthesia complication     sister slow to wake up with anesthesia  . History of bronchitis     > 57yrs ago  . Seasonal allergies     takes Claritin prn  . Dizziness     > 46yrs ago;took Antivert   . Numbness     to toes on each foot  . Joint pain   . Vitamin D deficiency     takes VIt D daily  . GERD (gastroesophageal reflux disease)     takes occasional TUMs  . History of colon polyps   . Diverticulosis   . Urinary frequency   . Cataract     immature on the left  . Breast cancer of upper-outer quadrant of left female breast 06/04/2014  . Diabetes  mellitus without complication   . Family history of breast cancer   . Family history of colon cancer   . Family history of uterine cancer     Past Surgical History  Procedure Laterality Date  . Tubal ligation    . Total knee arthroplasty Left 10/24/2012    Procedure: TOTAL KNEE ARTHROPLASTY;  Surgeon: Kim Lewandowsky, MD;  Location[facility];  Service: Orthopedics;  Laterality: Left;  . Colonoscopy    . Cataract extraction Right   . Total knee arthroplasty Right 01/09/2013    Procedure: TOTAL KNEE ARTHROPLASTY;  Surgeon: Kim Lewandowsky, MD;  Location: [facility];  Service: Orthopedics;  Laterality: Right;    History   Social History  . Marital Status: Married    Spouse Name: N/A  . Number of Children: N/A  . Years of Education: N/A   Social History Main Topics  . Smoking status: Never Smoker   . Smokeless tobacco: Not on file  . Alcohol Use: No  . Drug Use: No  . Sexual Activity: Yes   Other Topics Concern  . Not on file   Social History Narrative     FAMILY HISTORY:   During the visit, a 4-generation pedigree was obtained. Family tree will be sent for scanning and will be in EPIC under the Media tab.  Significant  diagnoses include the following:  Family History  Problem Relation Age of Onset  . Uterine cancer Mother     deceased 40  . Breast cancer Sister 40    currently 31  . Colon cancer Brother 10    currently 36  . Cancer Maternal Uncle     unk. primary; deceased 73s  . Thyroid cancer Daughter 78    currently 46; type?    Additionally, [name] has another daughter (age 38) and a son (age 77). She has 3 living sisters and 2 living brothers (one of whom is noted above). One sister died in her late 23R due to complications of lupus and diabetes. [name] mother had only brothers. Her father died at 38 of an MI and he has a sister (age 24) and a brother (deceased in 47s).  [name] ancestry is [place], [place] and [place]. There is no known Jewish ancestry and  no consanguinity.  ASSESSMENT AND PLAN: [name] is a 69 y.o. female with a personal history of breast cancer and family history of breast, colon and uterine cancers. Given her own age at diagnosis, her risk of having a hereditary predisposition to cancer is not very high. Given the combination of cancers in her family, genetic testing was recommended to rule out a pathogenic mutation as it will have significant implications for her and her family. We reviewed the characteristics, features and inheritance patterns of hereditary cancer syndromes. We also discussed genetic testing, including the process of testing, insurance coverage and implications of results. She does not meet Medicare's criteria for covering testing costs. For this reason, her sample was sent to [facility] who will not balance-bill her.   [name] wished to pursue genetic testing and a blood sample will be sent to [facility] for analysis of 21 genes: ATM, BARD1, BRCA1, BRCA2, BRIP1, CDH1, CHEK2, EPCAM, MLH1, MSH2, MSH6, NBN, NF1, PALB2, PMS2, PTEN, RAD50, RAD51C, RAD51D, STK11, TP53. We discussed the implications of a positive, negative and/ or Variant of Uncertain Significance (VUS) result. Results should be available in approximately 4-5 weeks, at which point we will contact her and address implications for her as well as address genetic testing for at-risk family members, if needed.    We encouraged [name] to remain in contact with Cancer Genetics annually so that we can update the family history and inform her of any changes in cancer genetics and testing that may be of benefit for this family. Ms.  Kim Castro questions were answered to her satisfaction today.   Thank you for the referral and allowing [place] to share in the care of your patient.   The patient was seen for a total of 30 minutes, greater than 50% of which was spent face-to-face counseling. This patient was discussed with the overseeing provider who agrees with the above.    Kim Berg, MS, [place] Certified Genetic Counselor phone: [phone] [email or web address]

## 2014-06-13 ENCOUNTER — Other Ambulatory Visit: Payer: Self-pay | Admitting: Emergency Medicine

## 2014-06-16 ENCOUNTER — Telehealth: Payer: Self-pay | Admitting: *Deleted

## 2014-06-16 NOTE — Telephone Encounter (Signed)
Spoke with patient and confirmed follow up appointment with Dr. Lindi Adie for 07/17/14 at 845am. Encouraged patient to call with any needs or concerns.

## 2014-06-17 ENCOUNTER — Other Ambulatory Visit (INDEPENDENT_AMBULATORY_CARE_PROVIDER_SITE_OTHER): Payer: Self-pay | Admitting: General Surgery

## 2014-06-17 ENCOUNTER — Ambulatory Visit: Payer: Medicare Other

## 2014-06-17 DIAGNOSIS — D0512 Intraductal carcinoma in situ of left breast: Secondary | ICD-10-CM

## 2014-06-19 ENCOUNTER — Encounter: Payer: Medicare Other | Attending: Family Medicine

## 2014-06-19 VITALS — Ht 65.5 in | Wt 244.4 lb

## 2014-06-19 DIAGNOSIS — E119 Type 2 diabetes mellitus without complications: Secondary | ICD-10-CM | POA: Diagnosis present

## 2014-06-19 DIAGNOSIS — Z713 Dietary counseling and surveillance: Secondary | ICD-10-CM | POA: Insufficient documentation

## 2014-06-19 NOTE — Patient Instructions (Addendum)
Plan:  Aim for 2-3 Carb Choices per meal (30-45 grams) +/- 1 either way (try to stay close to 2 servings and 30 grams of carbs per meal) Aim for 0-15 Carbs per snack if hungry  Include protein in moderation with your meals and snacks Consider reading food labels for Total Carbohydrate and Fat Grams of foods Consider  increasing your activity level by walking for 30 minutes daily as tolerated Continue taking medication as directed by MD

## 2014-06-19 NOTE — Progress Notes (Signed)
Diabetes Self-Management Education  Visit Type:  Initial visit  Appt. Start Time: 0900 Appt. End Time: 1030  06/19/2014  [name], identified by name and date of birth, is a 69 y.o. female with a diagnosis of Diabetes: Type 2.  Other people present during visit:  Patient .   ASSESSMENT  Height 5' 5.5" (1.664 m), weight 244 lb 6.4 oz (110.859 kg). Body mass index is 40.04 kg/(m^2).  Initial Visit Information:  Are you currently following a meal plan?: No Are you taking your medications as prescribed?: Not on Medications Are you checking your feet?: Yes How many days per week are you checking your feet?: 7 How often do you need to have someone help you when you read instructions, pamphlets, or other written materials from your doctor or pharmacy?: 1 - Never  Psychosocial:  Patient Belief/Attitude about Diabetes: Motivated to manage diabetes Self-care barriers: None Self-management support: Doctor's office, Family, CDE visits Other persons present: Patient Patient Concerns: Nutrition/Meal planning, Healthy Lifestyle Special Needs: None Preferred Learning Style: No preference indicated Learning Readiness: Ready  Complications:   Last HgB A1C per patient/outside source: 7.1 mg/dL How often do you check your blood sugar?: Not recommended by provider Have you had a dilated eye exam in the past 12 months?: Yes Have you had a dental exam in the past 12 months?: No (needs a tooth worked on but does not have the finances to be taken care of)  Diet Intake:  Breakfast: 1/2 grapefruit, 1 1/2 C cheerios, 2% milk Lunch: salad greens, vegetables, grilled chicken, croutons Dinner: vegetables, meat, starch Beverage(s): water, milk  Exercise:  Exercise: ADL's (will return to walking as weather warms)  Individualized Plan for Diabetes Self-Management Training:   Learning Objective:  Patient will have a greater understanding of diabetes self-management. Patient education plan  per assessed needs and concerns is to attend individual sessions     Education Topics Reviewed with Patient Today:  Definition of diabetes, type 1 and 2, and the diagnosis of diabetes, Factors that contribute to the development of diabetes Food label reading, portion sizes and measuring food., Role of diet in the treatment of diabetes and the relationship between the three main macronutrients and blood glucose level, Carbohydrate counting, Information on hints to eating out and maintain blood glucose control., Meal options for control of blood glucose level and chronic complications. Role of exercise on diabetes management, blood pressure control and cardiac health. Daily foot exams, Yearly dilated eye exam Relationship between chronic complications and blood glucose control, Dental care Role of stress on diabetes  PATIENTS GOALS/Plan (Developed by the patient):  Nutrition: General guidelines for healthy choices and portions discussed Physical Activity: Exercise 3-5 times per week, 30 minutes per day Medications: take my medication as prescribed Reducing Risk: do foot checks daily, get labs drawn  Plan:   Patient Instructions  Plan:  Aim for 2-3 Carb Choices per meal (30-45 grams) +/- 1 either way (try to stay closer to 2 carb servings/30 grams per meal) Aim for 0-15 Carbs per snack if hungry  Include protein in moderation with your meals and snacks Consider reading food labels for Total Carbohydrate and Fat Grams of foods Consider  increasing your activity level by walking for 30 minutes daily as tolerated Continue taking medication as directed by MD  Expected Outcomes:  Demonstrated interest in learning. Expect positive outcomes  Education material provided: Living Well with Diabetes, Food label handouts, A1C conversion sheet, Meal plan card, My Plate and Snack sheet  If problems or questions, patient to contact team via:  Phone  Future DSME appointment: PRN

## 2014-06-24 ENCOUNTER — Ambulatory Visit: Payer: Medicare Other

## 2014-06-26 ENCOUNTER — Encounter (HOSPITAL_BASED_OUTPATIENT_CLINIC_OR_DEPARTMENT_OTHER): Payer: Self-pay | Admitting: *Deleted

## 2014-06-26 ENCOUNTER — Ambulatory Visit: Payer: Medicare Other

## 2014-06-26 NOTE — Progress Notes (Signed)
Pt will come in for ekg after seeds 2/29-no cardiac or resp problems

## 2014-06-26 NOTE — Progress Notes (Signed)
   06/26/14 1234  OBSTRUCTIVE SLEEP APNEA  Have you ever been diagnosed with sleep apnea through a sleep study? No  Do you snore loudly (loud enough to be heard through closed doors)?  1  Do you often feel tired, fatigued, or sleepy during the daytime? 0  Has anyone observed you stop breathing during your sleep? 0  Do you have, or are you being treated for high blood pressure? 1  BMI more than 35 kg/m2? 1  Age over 67 years old? 1  Gender: 0

## 2014-06-30 ENCOUNTER — Ambulatory Visit
Admission: RE | Admit: 2014-06-30 | Discharge: 2014-06-30 | Disposition: A | Discharge status: Home / Self Care | Payer: Medicare Other | Source: Ambulatory Visit | Attending: General Surgery | Admitting: General Surgery

## 2014-06-30 ENCOUNTER — Encounter (HOSPITAL_BASED_OUTPATIENT_CLINIC_OR_DEPARTMENT_OTHER)
Admission: RE | Admit: 2014-06-30 | Discharge: 2014-06-30 | Disposition: A | Discharge status: Home / Self Care | Payer: Medicare Other | Source: Ambulatory Visit | Attending: General Surgery | Admitting: General Surgery

## 2014-06-30 DIAGNOSIS — D0512 Intraductal carcinoma in situ of left breast: Secondary | ICD-10-CM | POA: Diagnosis not present

## 2014-06-30 DIAGNOSIS — Z01810 Encounter for preprocedural cardiovascular examination: Secondary | ICD-10-CM | POA: Diagnosis present

## 2014-07-01 ENCOUNTER — Ambulatory Visit (HOSPITAL_BASED_OUTPATIENT_CLINIC_OR_DEPARTMENT_OTHER): Payer: Medicare Other | Admitting: Certified Registered"

## 2014-07-01 ENCOUNTER — Ambulatory Visit (HOSPITAL_BASED_OUTPATIENT_CLINIC_OR_DEPARTMENT_OTHER)
Admission: RE | Admit: 2014-07-01 | Discharge: 2014-07-01 | Disposition: A | Discharge status: Home / Self Care | Payer: Medicare Other | Source: Ambulatory Visit | Attending: General Surgery | Admitting: General Surgery

## 2014-07-01 ENCOUNTER — Encounter (HOSPITAL_BASED_OUTPATIENT_CLINIC_OR_DEPARTMENT_OTHER): Payer: Self-pay | Admitting: *Deleted

## 2014-07-01 ENCOUNTER — Encounter (HOSPITAL_BASED_OUTPATIENT_CLINIC_OR_DEPARTMENT_OTHER)
Admission: RE | Disposition: A | Discharge status: Home / Self Care | Payer: Self-pay | Source: Ambulatory Visit | Attending: General Surgery

## 2014-07-01 ENCOUNTER — Ambulatory Visit
Admission: RE | Admit: 2014-07-01 | Discharge: 2014-07-01 | Disposition: A | Discharge status: Home / Self Care | Payer: Medicare Other | Source: Ambulatory Visit | Attending: General Surgery | Admitting: General Surgery

## 2014-07-01 DIAGNOSIS — K219 Gastro-esophageal reflux disease without esophagitis: Secondary | ICD-10-CM | POA: Diagnosis not present

## 2014-07-01 DIAGNOSIS — Z803 Family history of malignant neoplasm of breast: Secondary | ICD-10-CM | POA: Diagnosis not present

## 2014-07-01 DIAGNOSIS — D0512 Intraductal carcinoma in situ of left breast: Secondary | ICD-10-CM | POA: Diagnosis present

## 2014-07-01 DIAGNOSIS — M1990 Unspecified osteoarthritis, unspecified site: Secondary | ICD-10-CM | POA: Insufficient documentation

## 2014-07-01 DIAGNOSIS — Z9889 Other specified postprocedural states: Secondary | ICD-10-CM | POA: Insufficient documentation

## 2014-07-01 DIAGNOSIS — Z6839 Body mass index (BMI) 39.0-39.9, adult: Secondary | ICD-10-CM | POA: Diagnosis not present

## 2014-07-01 DIAGNOSIS — C50412 Malignant neoplasm of upper-outer quadrant of left female breast: Secondary | ICD-10-CM

## 2014-07-01 DIAGNOSIS — E119 Type 2 diabetes mellitus without complications: Secondary | ICD-10-CM | POA: Diagnosis not present

## 2014-07-01 DIAGNOSIS — I10 Essential (primary) hypertension: Secondary | ICD-10-CM | POA: Insufficient documentation

## 2014-07-01 HISTORY — PX: BREAST LUMPECTOMY WITH RADIOACTIVE SEED LOCALIZATION: SHX6424

## 2014-07-01 SURGERY — BREAST LUMPECTOMY WITH RADIOACTIVE SEED LOCALIZATION
Anesthesia: General | Site: Breast | Laterality: Left

## 2014-07-01 MED ORDER — LACTATED RINGERS IV SOLN
INTRAVENOUS | Status: DC
  Administered 2014-07-01: 10:00:00 via INTRAVENOUS

## 2014-07-01 MED ORDER — CEFAZOLIN SODIUM-DEXTROSE 2-3 GM-% IV SOLR
INTRAVENOUS | Status: AC
  Filled 2014-07-01: qty 50

## 2014-07-01 MED ORDER — FENTANYL CITRATE 0.05 MG/ML IJ SOLN
INTRAMUSCULAR | Status: DC | PRN
  Administered 2014-07-01 (×2): 25 ug via INTRAVENOUS
  Administered 2014-07-01: 50 ug via INTRAVENOUS
  Administered 2014-07-01: 25 ug via INTRAVENOUS

## 2014-07-01 MED ORDER — MIDAZOLAM HCL 5 MG/5ML IJ SOLN
INTRAMUSCULAR | Status: DC | PRN
  Administered 2014-07-01: 2 mg via INTRAVENOUS

## 2014-07-01 MED ORDER — ONDANSETRON HCL 4 MG/2ML IJ SOLN
INTRAMUSCULAR | Status: DC | PRN
  Administered 2014-07-01: 4 mg via INTRAVENOUS

## 2014-07-01 MED ORDER — MIDAZOLAM HCL 2 MG/2ML IJ SOLN
INTRAMUSCULAR | Status: AC
  Filled 2014-07-01: qty 2

## 2014-07-01 MED ORDER — HYDROCODONE-ACETAMINOPHEN 5-325 MG PO TABS: 1.0000 | ORAL_TABLET | ORAL | Status: DC | PRN

## 2014-07-01 MED ORDER — FENTANYL CITRATE 0.05 MG/ML IJ SOLN: 50.0000 ug | INTRAMUSCULAR | Status: DC | PRN

## 2014-07-01 MED ORDER — MIDAZOLAM HCL 2 MG/2ML IJ SOLN: 1.0000 mg | INTRAMUSCULAR | Status: DC | PRN

## 2014-07-01 MED ORDER — HYDROMORPHONE HCL 1 MG/ML IJ SOLN: 0.2500 mg | INTRAMUSCULAR | Status: DC | PRN

## 2014-07-01 MED ORDER — PROPOFOL 10 MG/ML IV BOLUS
INTRAVENOUS | Status: DC | PRN
  Administered 2014-07-01: 50 mg via INTRAVENOUS
  Administered 2014-07-01: 200 mg via INTRAVENOUS

## 2014-07-01 MED ORDER — ONDANSETRON HCL 4 MG/2ML IJ SOLN: 4.0000 mg | Freq: Four times a day (QID) | INTRAMUSCULAR | Status: DC | PRN

## 2014-07-01 MED ORDER — CHLORHEXIDINE GLUCONATE 4 % EX LIQD: 1.0000 "application " | Freq: Once | CUTANEOUS | Status: DC

## 2014-07-01 MED ORDER — BUPIVACAINE-EPINEPHRINE (PF) 0.25% -1:200000 IJ SOLN
INTRAMUSCULAR | Status: DC | PRN
  Administered 2014-07-01: 20 mL via PERINEURAL

## 2014-07-01 MED ORDER — CEFAZOLIN SODIUM-DEXTROSE 2-3 GM-% IV SOLR
2.0000 g | INTRAVENOUS | Status: AC
  Administered 2014-07-01: 2 g via INTRAVENOUS

## 2014-07-01 MED ORDER — DEXAMETHASONE SODIUM PHOSPHATE 4 MG/ML IJ SOLN
INTRAMUSCULAR | Status: DC | PRN
  Administered 2014-07-01: 10 mg via INTRAVENOUS

## 2014-07-01 MED ORDER — FENTANYL CITRATE 0.05 MG/ML IJ SOLN
INTRAMUSCULAR | Status: AC
  Filled 2014-07-01: qty 6

## 2014-07-01 MED ORDER — LIDOCAINE HCL (CARDIAC) 20 MG/ML IV SOLN
INTRAVENOUS | Status: DC | PRN
  Administered 2014-07-01: 30 mg via INTRAVENOUS

## 2014-07-01 SURGICAL SUPPLY — 55 items
APPLIER CLIP 9.375 MED OPEN (MISCELLANEOUS) ×2
BINDER BREAST LRG (GAUZE/BANDAGES/DRESSINGS) IMPLANT
BINDER BREAST MEDIUM (GAUZE/BANDAGES/DRESSINGS) IMPLANT
BINDER BREAST XLRG (GAUZE/BANDAGES/DRESSINGS) IMPLANT
BINDER BREAST XXLRG (GAUZE/BANDAGES/DRESSINGS) ×2 IMPLANT
BLADE SURG 15 STRL LF DISP TIS (BLADE) ×1 IMPLANT
BLADE SURG 15 STRL SS (BLADE) ×1
CANISTER SUC SOCK COL 7IN (MISCELLANEOUS) IMPLANT
CANISTER SUCT 1200ML W/VALVE (MISCELLANEOUS) IMPLANT
CHLORAPREP W/TINT 26ML (MISCELLANEOUS) ×2 IMPLANT
CLIP APPLIE 9.375 MED OPEN (MISCELLANEOUS) ×1 IMPLANT
COVER BACK TABLE 60X90IN (DRAPES) ×2 IMPLANT
COVER MAYO STAND STRL (DRAPES) ×2 IMPLANT
COVER PROBE W GEL 5X96 (DRAPES) ×2 IMPLANT
DECANTER SPIKE VIAL GLASS SM (MISCELLANEOUS) IMPLANT
DEVICE DUBIN W/COMP PLATE 8390 (MISCELLANEOUS) ×2 IMPLANT
DRAPE LAPAROSCOPIC ABDOMINAL (DRAPES) ×2 IMPLANT
DRAPE UTILITY XL STRL (DRAPES) ×2 IMPLANT
ELECT COATED BLADE 2.86 ST (ELECTRODE) ×2 IMPLANT
ELECT REM PT RETURN 9FT ADLT (ELECTROSURGICAL) ×2
ELECTRODE REM PT RTRN 9FT ADLT (ELECTROSURGICAL) ×1 IMPLANT
GLOVE BIO SURGEON STRL SZ 6.5 (GLOVE) ×2 IMPLANT
GLOVE BIOGEL PI IND STRL 6.5 (GLOVE) ×1 IMPLANT
GLOVE BIOGEL PI IND STRL 7.5 (GLOVE) ×1 IMPLANT
GLOVE BIOGEL PI IND STRL 8 (GLOVE) ×1 IMPLANT
GLOVE BIOGEL PI INDICATOR 6.5 (GLOVE) ×1
GLOVE BIOGEL PI INDICATOR 7.5 (GLOVE) ×1
GLOVE BIOGEL PI INDICATOR 8 (GLOVE) ×1
GLOVE ECLIPSE 6.5 STRL STRAW (GLOVE) ×2 IMPLANT
GLOVE ECLIPSE 7.5 STRL STRAW (GLOVE) ×2 IMPLANT
GLOVE SURG SS PI 7.0 STRL IVOR (GLOVE) ×2 IMPLANT
GOWN STRL REUS W/ TWL LRG LVL3 (GOWN DISPOSABLE) ×2 IMPLANT
GOWN STRL REUS W/ TWL XL LVL3 (GOWN DISPOSABLE) ×1 IMPLANT
GOWN STRL REUS W/TWL LRG LVL3 (GOWN DISPOSABLE) ×2
GOWN STRL REUS W/TWL XL LVL3 (GOWN DISPOSABLE) ×1
KIT MARKER MARGIN INK (KITS) ×2 IMPLANT
LIQUID BAND (GAUZE/BANDAGES/DRESSINGS) ×2 IMPLANT
NDL SAFETY ECLIPSE 18X1.5 (NEEDLE) IMPLANT
NEEDLE HYPO 18GX1.5 SHARP (NEEDLE)
NEEDLE HYPO 25X1 1.5 SAFETY (NEEDLE) ×2 IMPLANT
NS IRRIG 1000ML POUR BTL (IV SOLUTION) IMPLANT
PACK BASIN DAY SURGERY FS (CUSTOM PROCEDURE TRAY) ×2 IMPLANT
PENCIL BUTTON HOLSTER BLD 10FT (ELECTRODE) ×2 IMPLANT
SLEEVE SCD COMPRESS KNEE MED (MISCELLANEOUS) ×2 IMPLANT
SPONGE LAP 4X18 X RAY DECT (DISPOSABLE) ×2 IMPLANT
SUT MNCRL AB 4-0 PS2 18 (SUTURE) ×2 IMPLANT
SUT SILK 2 0 SH (SUTURE) IMPLANT
SUT VIC AB 3-0 SH 27 (SUTURE) ×1
SUT VIC AB 3-0 SH 27X BRD (SUTURE) ×1 IMPLANT
SUT VICRYL 3-0 CR8 SH (SUTURE) IMPLANT
SYR CONTROL 10ML LL (SYRINGE) ×2 IMPLANT
TOWEL OR 17X24 6PK STRL BLUE (TOWEL DISPOSABLE) ×2 IMPLANT
TOWEL OR NON WOVEN STRL DISP B (DISPOSABLE) ×2 IMPLANT
TUBE CONNECTING 20X1/4 (TUBING) IMPLANT
YANKAUER SUCT BULB TIP NO VENT (SUCTIONS) IMPLANT

## 2014-07-01 NOTE — Interval H&P Note (Signed)
History and Physical Interval Note:  07/01/2014 10:40 AM  Kim Castro  has presented today for surgery, with the diagnosis of DCIS left breast  The various methods of treatment have been discussed with the patient and family. After consideration of risks, benefits and other options for treatment, the patient has consented to  Procedure(s): LEFT BREAST LUMPECTOMY WITH RADIOACTIVE SEED LOCALIZATION (Left) as a surgical intervention .  The patient's history has been reviewed, patient examined, no change in status, stable for surgery.  I have reviewed the patient's chart and labs.  Questions were answered to the patient's satisfaction.     Gurnie Duris T

## 2014-07-01 NOTE — H&P (Signed)
History of Present Illness Kim Castro T. Fatimah Sundquist Castro; 06/11/2014 10:19 AM) The patient is a 69 year old female who presents with breast cancer. Kim Castro is a postmenopausal female referred by Dr. Claudie Revering for evaluation of recently diagnosed carcinoma of the left breast. She recently presented for a screening mamogram revealing a new small area of suspicious ntpearing calcifications in the upper outer quadrant of the left breast. Subsequent imaging included diagnostic mamogram showing adjacent 6 mm and 1 mm foci of microcalcifications which in total's and an area of 1.5 cm. A stereotactic guided breast biopsy was performed on 05/29/2014 with pathology revealing ductal carcinoma in situ of the breast, intermediate grade, ER and PR positive. subsequent bilateral breast MRI showed a 2.4 x 1.3 x 1.1 cm Area of non-mass enhancement in the upper outer quadrant of the left breast containing the marker clip. the clip appears to be in the larger area ofcalcifications with 1 millimeter area of calcifications about 1 cm posterior to the clip. She is seen now in breast multidisciplinary clinic for initial treatment planning. She has experienced no breast symptoms, specifically lump or nipple discharge or skin changes. She does have a history of right breast biopsies in the past and these areas are stable on current imaging and previously diagnosed as fibroadenomas. Findings at that time were the following: Tumor size: 2.4 cm Tumor grade: Intermediate Estrogen Receptor: positive Progesterone Receptor: positive   Other Problems Kim Nodal, RN; 06/11/2014 9:10 AM) Back Pain Diabetes Mellitus Gastroesophageal Reflux Disease Hemorrhoids High blood pressure Lump In Breast  Past Surgical History Kim Nodal, RN; 06/11/2014 9:10 AM) Breast Biopsy Bilateral. Cataract Surgery Right. Colon Polyp Removal - Colonoscopy Knee Surgery Bilateral. Oral Surgery  Diagnostic Studies History  Kim Nodal, RN; 06/11/2014 9:10 AM) Colonoscopy 1-5 years ago Mammogram within last year Pap Smear 1-5 years ago  Social History Kim Nodal, RN; 06/11/2014 9:10 AM) No alcohol use No caffeine use No drug use Tobacco use Never smoker.  Family History Kim Nodal, RN; 06/11/2014 9:10 AM) Breast Cancer Sister. Colon Cancer Brother. Depression Sister. Diabetes Mellitus Mother, Sister. Heart Disease Father, Sister. Heart disease in female family member before age 32 Heart disease in female family member before age 2 Hypertension Brother, Father, Sister. Kidney Disease Sister. Ovarian Cancer Mother. Seizure disorder Sister. Thyroid problems Brother, Daughter, Sister.  Pregnancy / Birth History Kim Nodal, RN; 06/11/2014 9:10 AM) Age at menarche 42 years. Age of menopause 51-55 Contraceptive History Oral contraceptives. Gravida 4 Irregular periods Maternal age 78-20 Para 3  Review of Systems Kim Nodal RN; 06/11/2014 9:10 AM) General Not Present- Appetite Loss, Chills, Fatigue, Fever, Night Sweats, Weight Gain and Weight Loss. Skin Present- Dryness. Not Present- Change in Wart/Mole, Hives, Jaundice, New Lesions, Non-Healing Wounds, Rash and Ulcer. HEENT Present- Seasonal Allergies and Wears glasses/contact lenses. Not Present- Earache, Hearing Loss, Hoarseness, Nose Bleed, Oral Ulcers, Ringing in the Ears, Sinus Pain, Sore Throat, Visual Disturbances and Yellow Eyes. Respiratory Present- Chronic Cough and Snoring. Not Present- Bloody sputum, Difficulty Breathing and Wheezing. Breast Not Present- Breast Mass, Breast Pain, Nipple Discharge and Skin Changes. Cardiovascular Present- Leg Cramps and Swelling of Extremities. Not Present- Chest Pain, Difficulty Breathing Lying Down, Palpitations, Rapid Heart Rate and Shortness of Breath. Gastrointestinal Not Present- Abdominal Pain, Bloating, Bloody Stool, Change in Bowel Habits, Chronic diarrhea,  Constipation, Difficulty Swallowing, Excessive gas, Gets full quickly at meals, Hemorrhoids, Indigestion, Nausea, Rectal Pain and Vomiting. Female Genitourinary Not Present- Frequency, Nocturia, Painful Urination, Pelvic Pain and Urgency. Musculoskeletal Present-  Back Pain. Not Present- Joint Pain, Joint Stiffness, Muscle Pain, Muscle Weakness and Swelling of Extremities. Neurological Not Present- Decreased Memory, Fainting, Headaches, Numbness, Seizures, Tingling, Tremor, Trouble walking and Weakness. Psychiatric Not Present- Anxiety, Bipolar, Change in Sleep Pattern, Depression, Fearful and Frequent crying. Endocrine Present- Cold Intolerance. Not Present- Excessive Hunger, Hair Changes, Heat Intolerance, Hot flashes and New Diabetes. Hematology Not Present- Easy Bruising, Excessive bleeding, Gland problems, HIV and Persistent Infections.   Physical Exam Kim Castro T. My Kim Castro; 06/11/2014 10:20 AM) The physical exam findings are as follows: Note:General: Alert, moderately obese Caucasian female, in no distress Skin: Warm and dry without rash or infection. HEENT: No palpable masses or thyromegaly. Sclera nonicteric. Pupils equal round and reactive. Oropharynx clear. Lymph nodes: No cervical, supraclavicular, or inguinal nodes palpable. Breasts: No palpable masses in either breast with just some left.thickening at the site of core biopsy on the left. No nipple discharge or inversion. Lungs: Breath sounds clear and equal. No wheezing or increased work of breathing. Cardiovascular: Regular rate and rhythm without murmer. No JVD or edema. Peripheral pulses intact. No carotid bruits. Abdomen: Nondistended. Soft and nontender. No masses palpable. No organomegaly. No palpable hernias. Extremities: No edema or joint swelling or deformity. No chronic venous stasis changes. Neurologic: Alert and fully oriented. Gait normal. No focal weakness. Psychiatric: Normal mood and affect. Thought content  appropriate with normal judgement and insight    Assessment & Plan Kim Castro T. Gordon Carlson Castro; 06/11/2014 10:25 AM) DCIS (DUCTAL CARCINOMA IN SITU), LEFT (233.0  D05.12) Impression: 69 year old female with a new diagnosis of cancer of the left breast, upper outer quadrant. Clinical stage 0, ERpos, PRpos. I discussed with the patient and family members present today initial surgical treatment options. We discussed options of breast conservation with lumpectomy or total mastectomy. After discussion they have elected to proceed with lumpectomy. We discussed the indications and nature of the procedure, and expected recovery, in detail. Surgical risks including anesthetic complications, cardiorespiratory complications, bleeding, infection, wound healing complications, blood clots, and possible need for further surgery based on the final pathology was discussed and understood. Hormonal therapy and radiation therapy have been discussed. They have been provided with literature regarding the treatment of breast cancer. All questions were answered. They understand and agree to proceed and we will go ahead with scheduling. Current Plans  Schedule for Surgery Radioactive seed localized left breast lumpectomy under general anesthesia as an outpatient

## 2014-07-01 NOTE — Transfer of Care (Signed)
Immediate Anesthesia Transfer of Care Note  Patient: Kim Castro  Procedure(s) Performed: Procedure(s): LEFT BREAST LUMPECTOMY WITH RADIOACTIVE SEED LOCALIZATION (Left)  Patient Location: PACU  Anesthesia Type:General  Level of Consciousness: awake, alert , oriented and patient cooperative  Airway & Oxygen Therapy: Patient Spontanous Breathing and Patient connected to face mask oxygen  Post-op Assessment: Report given to RN and Post -op Vital signs reviewed and stable  Post vital signs: Reviewed and stable  Last Vitals:  Filed Vitals:   07/01/14 1153  BP:   Pulse: 82  Temp:   Resp: 33    Complications: No apparent anesthesia complications

## 2014-07-01 NOTE — Anesthesia Procedure Notes (Signed)
Procedure Name: LMA Insertion Date/Time: 07/01/2014 10:54 AM Performed by: Jawanna Dykman Pre-anesthesia Checklist: Patient identified, Emergency Drugs available, Suction available and Patient being monitored Patient Re-evaluated:Patient Re-evaluated prior to inductionOxygen Delivery Method: Circle System Utilized Preoxygenation: Pre-oxygenation with 100% oxygen Intubation Type: IV induction Ventilation: Mask ventilation without difficulty LMA: LMA inserted LMA Size: 4.0 Number of attempts: 1 Airway Equipment and Method: Bite block Placement Confirmation: positive ETCO2 Tube secured with: Tape Dental Injury: Teeth and Oropharynx as per pre-operative assessment

## 2014-07-01 NOTE — Discharge Instructions (Signed)
[facility] °Office Phone Number [phone] ° °BREAST BIOPSY/ PARTIAL MASTECTOMY: POST OP INSTRUCTIONS ° °Always review your discharge instruction sheet given to you by the facility where your surgery was performed. ° °IF YOU HAVE DISABILITY OR FAMILY LEAVE FORMS, YOU MUST BRING THEM TO THE OFFICE FOR PROCESSING.  DO NOT GIVE THEM TO YOUR DOCTOR. ° °1. A prescription for pain medication may be given to you upon discharge.  Take your pain medication as prescribed, if needed.  If narcotic pain medicine is not needed, then you may take acetaminophen (Tylenol) or ibuprofen (Advil) as needed. °2. Take your usually prescribed medications unless otherwise directed °3. If you need a refill on your pain medication, please contact your pharmacy.  They will contact our office to request authorization.  Prescriptions will not be filled after 5pm or on week-ends. °4. You should eat very light the first 24 hours after surgery, such as soup, crackers, pudding, etc.  Resume your normal diet the day after surgery. °5. Most patients will experience some swelling and bruising in the breast.  Ice packs and a good support bra will help.  Swelling and bruising can take several days to resolve.  °6. It is common to experience some constipation if taking pain medication after surgery.  Increasing fluid intake and taking a stool softener will usually help or prevent this problem from occurring.  A mild laxative (Milk of Magnesia or Miralax) should be taken according to package directions if there are no bowel movements after 48 hours. °7. Unless discharge instructions indicate otherwise, you may remove your bandages 24-48 hours after surgery, and you may shower at that time.  You may have steri-strips (small skin tapes) in place directly over the incision.  These strips should be left on the skin for 7-10 days.  If your surgeon used skin glue on the incision, you may shower in 24 hours.  The glue will flake off over the  next 2-3 weeks.  Any sutures or staples will be removed at the office during your follow-up visit. °8. ACTIVITIES:  You may resume regular daily activities (gradually increasing) beginning the next day.  Wearing a good support bra or sports bra minimizes pain and swelling.  You may have sexual intercourse when it is comfortable. °a. You may drive when you no longer are taking prescription pain medication, you can comfortably wear a seatbelt, and you can safely maneuver your car and apply brakes. °b. RETURN TO WORK:  ______________________________________________________________________________________ °9. You should see your doctor in the office for a follow-up appointment approximately two weeks after your surgery.  Your doctor’s nurse will typically make your follow-up appointment when she calls you with your pathology report.  Expect your pathology report 2-3 business days after your surgery.  You may call to check if you do not hear from us after three days. °10. OTHER INSTRUCTIONS: _______________________________________________________________________________________________ _____________________________________________________________________________________________________________________________________ °_____________________________________________________________________________________________________________________________________ °_____________________________________________________________________________________________________________________________________ ° °WHEN TO CALL YOUR DOCTOR: °1. Fever over 101.0 °2. Nausea and/or vomiting. °3. Extreme swelling or bruising. °4. Continued bleeding from incision. °5. Increased pain, redness, or drainage from the incision. ° °The clinic staff is available to answer your questions during regular business hours.  Please don’t hesitate to call and ask to speak to one of the nurses for clinical concerns.  If you have a medical emergency, go to the nearest  emergency room or call 911.  A surgeon from [facility] is always on call at the hospital. ° °For further questions, please visit [email or web address]  ° ° ° °  Post Anesthesia Home Care Instructions  Activity: Get plenty of rest for the remainder of the day. A responsible adult should stay with you for 24 hours following the procedure.  For the next 24 hours, DO NOT: -Drive a car -Paediatric nurse -Drink alcoholic beverages -Take any medication unless instructed by your physician -Make any legal decisions or sign important papers.  Meals: Start with liquid foods such as gelatin or soup. Progress to regular foods as tolerated. Avoid greasy, spicy, heavy foods. If nausea and/or vomiting occur, drink only clear liquids until the nausea and/or vomiting subsides. Call your physician if vomiting continues.  Special Instructions/Symptoms: Your throat may feel dry or sore from the anesthesia or the breathing tube placed in your throat during surgery. If this causes discomfort, gargle with warm salt water. The discomfort should disappear within 24 hours.  Post Anesthesia Home Care Instructions  Activity: Get plenty of rest for the remainder of the day. A responsible adult should stay with you for 24 hours following the procedure.  For the next 24 hours, DO NOT: -Drive a car -Paediatric nurse -Drink alcoholic beverages -Take any medication unless instructed by your physician -Make any legal decisions or sign important papers.  Meals: Start with liquid foods such as gelatin or soup. Progress to regular foods as tolerated. Avoid greasy, spicy, heavy foods. If nausea and/or vomiting occur, drink only clear liquids until the nausea and/or vomiting subsides. Call your physician if vomiting continues.  Special Instructions/Symptoms: Your throat may feel dry or sore from the anesthesia or the breathing tube placed in your throat during surgery. If this causes discomfort, gargle  with warm salt water. The discomfort should disappear within 24 hours.

## 2014-07-01 NOTE — Anesthesia Postprocedure Evaluation (Signed)
Anesthesia Post Note  Patient: Kim Castro  Procedure(s) Performed: Procedure(s) (LRB): LEFT BREAST LUMPECTOMY WITH RADIOACTIVE SEED LOCALIZATION (Left)  Anesthesia type: General  Patient location: PACU  Post pain: Pain level controlled and Adequate analgesia  Post assessment: Post-op Vital signs reviewed, Patient's Cardiovascular Status Stable, Respiratory Function Stable, Patent Airway and Pain level controlled  Last Vitals:  Filed Vitals:   07/01/14 1257  BP: 117/47  Pulse: 63  Temp: 36.4 C  Resp: 20    Post vital signs: Reviewed and stable  Level of consciousness: awake, alert  and oriented  Complications: No apparent anesthesia complications

## 2014-07-01 NOTE — Op Note (Signed)
Preoperative Diagnosis: DCIS left breast  Postoprative Diagnosis: DCIS left breast  Procedure: Procedure(s): LEFT BREAST LUMPECTOMY WITH RADIOACTIVE SEED LOCALIZATION   Surgeon: Excell Seltzer T   Assistants: None  Anesthesia:  General LMA anesthesia  Indications:  Patient is a 69 year old female with a recent mammogram showing a new area of abnormal calcifications in the lateral breast. A large core needle biopsy has revealed ductal carcinoma in situ. There are 2 adjacent areas with total area measuring just over 2 cm. After discussion regarding options and risks detailed elsewhere we have elected proceed with radioactive seed localized lumpectomy as initial surgical treatment    Procedure Detail:  The patient had previously undergone accurate radioactive seed placement in the left breast. In the holding area the seed was confirmed in the correct location with the neoprobe. Patient was then taken to the operating room, placed in supine position on the operating table, and general laryngeal mask anesthesia induced. The left breast was widely sterilely prepped and draped. She received preoperative IV antibiotics. PAS were in place. Patient timeout was performed and correct procedure verified. A hot area was localized in the lateral left breast and a curvilinear incision made. The lesion was very close to the skin surface. Fairly thin skin flaps were raised due to this fact in all directions over the area of increased counts. Using the neoprobe for guidance a generous specimen of breast tissue was excised with cautery around the seed. Ex vivo the seed seemed centrally placed within the specimen using the neoprobe although again was somewhat closer to the anterior surface which was just under the skin. The specimen was inked for margins and specimen x-ray obtained showing the marking clip in radioactive seeds centrally located within the specimen. The soft tissue was infiltrated with Marcaine.  Complete hemostasis was obtained. The lumpectomy cavity was marked with clips. Deep and subcutaneous tissue was closed with interrupted 3-0 Vicryl and the skin with subcuticular 4-0 Monocryl and Dermabond. Sponge needle and instrument counts were correct.    Findings: As above  Estimated Blood Loss:  Minimal         Drains: nnone  Blood Given: none          Specimens: Left breast lumpectomy        Complications:  * No complications entered in OR log *         Disposition: PACU - hemodynamically stable.         Condition: stable

## 2014-07-01 NOTE — Anesthesia Preprocedure Evaluation (Signed)
Anesthesia Evaluation  Patient identified by MRN, date of birth, ID band Patient awake    Reviewed: Allergy & Precautions, NPO status , Patient's Chart, lab work & pertinent test results  Airway Mallampati: II   Neck ROM: full    Dental   Pulmonary neg pulmonary ROS,  breath sounds clear to auscultation        Cardiovascular hypertension, Rhythm:regular Rate:Normal     Neuro/Psych    GI/Hepatic GERD-  ,  Endo/Other  diabetes, Type 2Morbid obesity  Renal/GU      Musculoskeletal  (+) Arthritis -,   Abdominal   Peds  Hematology   Anesthesia Other Findings   Reproductive/Obstetrics                             Anesthesia Physical Anesthesia Plan  ASA: II  Anesthesia Plan: General   Post-op Pain Management:    Induction: Intravenous  Airway Management Planned: LMA  Additional Equipment:   Intra-op Plan:   Post-operative Plan:   Informed Consent: I have reviewed the patients History and Physical, chart, labs and discussed the procedure including the risks, benefits and alternatives for the proposed anesthesia with the patient or authorized representative who has indicated his/her understanding and acceptance.     Plan Discussed with: CRNA, Anesthesiologist and Surgeon  Anesthesia Plan Comments:         Anesthesia Quick Evaluation

## 2014-07-02 ENCOUNTER — Encounter (HOSPITAL_BASED_OUTPATIENT_CLINIC_OR_DEPARTMENT_OTHER): Payer: Self-pay | Admitting: General Surgery

## 2014-07-03 ENCOUNTER — Ambulatory Visit: Payer: Medicare Other

## 2014-07-08 ENCOUNTER — Encounter: Payer: Self-pay | Admitting: Genetic Counselor

## 2014-07-08 DIAGNOSIS — Z1379 Encounter for other screening for genetic and chromosomal anomalies: Secondary | ICD-10-CM

## 2014-07-08 NOTE — Progress Notes (Signed)
GENETIC TEST RESULTS  Patient Name: Kim Castro Patient Age: 69 y.o. Encounter Date: 07/08/2014  Referring Physician: Nicholas Lose, MD   Kim Castro was called today to discuss genetic test results. Please see the Genetics note from her visit on 06/12/14 for a detailed discussion of her personal and family history.  GENETIC TESTING: At the time of Kim Castro's visit, we recommended she pursue genetic testing of multiple genes associated with hereditary cancer. This test, which included sequencing and deletion/duplication analysis of 21 genes, was performed at [facility]. Testing was normal and did not reveal any clearly harmful mutation in these genes. The genes tested were ATM, BARD1, BRCA1, BRCA2, BRIP1, CDH1, CHEK2, EPCAM, MLH1, MSH2, MSH6, NBN, NF1, PALB2, PMS2, PTEN, RAD50, RAD51C, RAD51D, STK11, TP53  We discussed with Kim Castro that since the current test is not perfect, it is possible there may be a gene mutation that current testing cannot detect, but that chance is small. We also discussed that it is possible that a different genetic factor, which was not part of this testing or has not yet been discovered, is responsible for the cancer diagnoses in the family. Again, the likelihood of this is low. Lastly, there may be a detectable mutation in the family that Kim Castro did not inherit. Should Kim Castro wish to discuss or pursue this additional testing, we are happy to coordinate this at any time, but do not feel that she is at significant risk of harboring a mutation in a different gene.     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 At this time, it is unknown if this variant is associated with increased cancer risk or if this is a normal finding, but most variants such as this get reclassified to being inconsequential. It should not be used to make medical management decisions. With time, we suspect the lab will  determine the significance of this variant, if any. If we do learn more about it, we will try to contact Kim Castro to discuss it further. However, it is important to stay in touch with [place] periodically and keep the address and phone number up to date.  CANCER SCREENING: This result suggests that Kim Castro's cancer was most likely not due to an inherited predisposition. Most cancers happen by chance and this test, along with details of her family history, suggests that her cancer falls into this category. We, therefore, recommended she continue to follow the cancer screening guidelines provided by her physician.   FAMILY MEMBERS: While these results are reassuring for Kim Castro, we discussed that her sister may wish to undergo testing since she was diagnosed with breast cancer 10 years younger than Kim Castro. Genetic counselors can be located in other cities, by visiting the website of the [employer] ([employer]ist[employer]se) and Field seismologist for a Dietitian by zip code.  Lastly, we discussed with Kim Castro that cancer genetics is a rapidly advancing field and it is possible that new genetic tests will be appropriate for her in the future. We encouraged her to remain in contact with [place] on an annual basis so we can update her personal and family histories, and let her know of advances in cancer genetics that may benefit the family. Our contact number was provided. Kim Castro questions were answered to her satisfaction today, and she knows she is welcome to call anytime with additional questions.    Steele Berg,  MS, [place] Certified Genetic Counselor phone: [phone] [email or web address]

## 2014-07-11 NOTE — Progress Notes (Signed)
Location of Breast Cancer:Left breast cancer of upper outer quadrant.DCIS with calcifications  Histology per Pathology Report:  07/01/2014 Diagnosis Breast, lumpectomy, Left - DUCTAL CARCINOMA IN SITU, SEE COMMENT. - IN SITU CARCINOMA IS 1 MM FROM THE NEAREST MARGIN (INFERIOR). - PREVIOUS BIOPSY SITE. - SEE TUMOR SYNOPTIC TEMPLATE BELOW.  Receptor Status: ER(+), PR (+), Her2-neu ()  Did patient present with symptoms (if so, please note symptoms) or was this found on screening mammography?:Found on screening  Past/Anticipated interventions by surgeon, if any:07/01/2014 BREAST LUMPECTOMY WITH RADIOACTIVE SEED LOCALIZATION by Dr.Benjamin Hoxworth  Past/Anticipated interventions by medical oncology, if any: Chemotherapy not required.reccomend radiation followed by antiestrogen with anastrozole x 5 years, Dr. Lindi Adie seen today 07/17/14  Lymphedema issues, if any: NO  Pain issues, if any:  NO  SAFETY ISSUES:  Prior radiation? No  Pacemaker/ICD?No  Possible current pregnancy?No.last menstrual period in 2000.  Is the patient on methotrexate?NO   Current Complaints / other details:Married.Menarche age 36, menopause age 71.first child born age 81. Pregnancies x 3 GXP3.Took BCP for 5 years. Mother had uterine cancer age 46, deceased age 27 Brother had colon cancer age 68 living, surgery only  8 years ago Sister breast cancer age 49.,double mastectomy and radiation 5-6 years ago in 69 No history of smoking, alcohol or drugs. Allergies:oxycodone     Arlyss Repress, RN 07/11/2014,3:32 PM

## 2014-07-17 ENCOUNTER — Ambulatory Visit (HOSPITAL_BASED_OUTPATIENT_CLINIC_OR_DEPARTMENT_OTHER): Payer: Medicare Other | Admitting: Hematology and Oncology

## 2014-07-17 ENCOUNTER — Ambulatory Visit
Admission: RE | Admit: 2014-07-17 | Discharge: 2014-07-17 | Disposition: A | Discharge status: Home / Self Care | Payer: Medicare Other | Source: Ambulatory Visit | Attending: Radiation Oncology | Admitting: Radiation Oncology

## 2014-07-17 ENCOUNTER — Telehealth: Payer: Self-pay | Admitting: Hematology and Oncology

## 2014-07-17 DIAGNOSIS — C50412 Malignant neoplasm of upper-outer quadrant of left female breast: Secondary | ICD-10-CM | POA: Diagnosis not present

## 2014-07-17 DIAGNOSIS — D0512 Intraductal carcinoma in situ of left breast: Secondary | ICD-10-CM

## 2014-07-17 MED ORDER — ANASTROZOLE 1 MG PO TABS: 1.0000 mg | ORAL_TABLET | Freq: Every day | ORAL | Status: DC

## 2014-07-17 NOTE — Progress Notes (Addendum)
   Department of Radiation Oncology  Phone:  [phone] Fax:        [phone]   Name: Kim Castro MRN: [id number]  DOB: 1945/05/05  Date: 07/17/2014  Follow Up Visit Note  Diagnosis: Breast cancer of upper-outer quadrant of left female breast   Staging form: Breast, AJCC 7th Edition     Clinical stage from 06/11/2014: Stage 0 (Tis (DCIS), N0, M0) - Unsigned     Pathologic stage from 07/03/2014: Stage Unknown (Tis (DCIS), NX, cM0) - Signed by Enid Cutter, MD on 07/10/2014       Staging comments: Staged on final lumpectomy specimen by Dr. Donato Heinz.  Interval History: Kim Castro presents today for routine followup.  She had her lumpectomy on 3/1 and had DCIS as expected. The inferior margin was close but negative.  She has recovered well from surgery. She is ready to get started with radiation. She sees Dr. Lindi Adie on Monday.   Physical Exam:  There were no vitals filed for this visit. Incision well healed. No erythema or infection.   Kim Castro is a 69 y.o. female s/p lumpectomy for DCIS  PLAN:  I spoke to the patient today regarding her diagnosis and options for treatment. We discussed the role of radiation in decreasing local failures in patients who undergo lumpectomy. We discussed the process of simulation and the placement tattoos. We discussed 4-6 weeks of treatment as an outpatient. We discussed the possibility of asymptomatic lung damage. We discussed the low likelihood of secondary malignancies. We discussed the possible side effects including but not limited to skin redness, fatigue, permanent skin darkening, and breast swelling.   She signed informed consent. We will get her scheduled for simulation next week with plans to start RT the first week of April.   I would like to get a pre-RT mammogram prior to that.   Thea Silversmith, MD

## 2014-07-17 NOTE — Addendum Note (Signed)
Encounter addended by: Thea Silversmith, MD on: 07/17/2014 12:35 PM<BR>     Documentation filed: Notes Section

## 2014-07-17 NOTE — Telephone Encounter (Signed)
Appointments made and avs printed for patient °

## 2014-07-17 NOTE — Addendum Note (Signed)
Encounter addended by: Thea Silversmith, MD on: 07/17/2014 12:33 PM<BR>     Documentation filed: Notes Section

## 2014-07-17 NOTE — Progress Notes (Signed)
Please see the Nurse Progress Note in the MD Initial Consult Encounter for this patient. 

## 2014-07-17 NOTE — Assessment & Plan Note (Signed)
Left breast DCIS status post lumpectomy 07/01/2014 ER 100%, PR 96% Right breast fibroadenoma 1.2 cm previously biopsied in 2009  Pathology review: I discussed the final pathology report with the patient and explained that there are no surprises. There is no evidence of invasive breast cancer.  Recommendation: 1. Adjuvant radiation therapy followed by  2. Adjuvant antiestrogen therapy with anastrozole 1 mg daily for 5 years to start approximately June 1  Aromatase under counseling:We discussed the risks and benefits of anti-estrogen therapy with aromatase inhibitors. These include but not limited to insomnia, hot flashes, mood changes, vaginal dryness, bone density loss, and weight gain. Although rare, serious side effects including endometrial cancer, risk of blood clots were also discussed. We strongly believe that the benefits far outweigh the risks. Patient understands these risks and consented to starting treatment. Planned treatment duration is 5 years.  Return to clinic 1 month after starting antiestrogen therapy approximately in July 1 week

## 2014-07-17 NOTE — Progress Notes (Signed)
Patient Care Team: Hulan Fess, MD as PCP - General (Family Medicine) Excell Seltzer, MD as Consulting Physician (General Surgery) Rolm Bookbinder, MD as Consulting Physician (General Surgery) Thea Silversmith, MD as Consulting Physician (Radiation Oncology) Rockwell Germany, RN as Registered Nurse Mauro Kaufmann, RN as Registered Nurse Holley Bouche, NP as Nurse Practitioner (Nurse Practitioner)  DIAGNOSIS: Breast cancer of upper-outer quadrant of left female breast   Staging form: Breast, AJCC 7th Edition     Clinical stage from 06/11/2014: Stage 0 (Tis (DCIS), N0, M0) - Unsigned     Pathologic stage from 07/03/2014: Stage Unknown (Tis (DCIS), NX, cM0) - Signed by Enid Cutter, MD on 07/10/2014       Staging comments: Staged on final lumpectomy specimen by Dr. Donato Heinz.    SUMMARY OF ONCOLOGIC HISTORY:   Breast cancer of upper-outer quadrant of left female breast   06/04/2014 Initial Diagnosis Left breast biopsy: DCIS with calcifications, ER 100%, PR 96%   06/10/2014 Breast MRI Left breast: 2.4 x 1.3 x 1.1 cm area of patchy non-mass enhancement upper outer quadrant includes postbiopsy seroma; Right breast: 1.2 cm previously biopsied stable benign fibroadenoma   07/01/2014 Surgery Left breast lumpectomy: DCIS, 2.3 cm, 1 mm margin, ER 100%, PR 96%    CHIEF COMPLIANT: Follow-up after lumpectomy  INTERVAL HISTORY: Kim Castro is a 69 year old lady with above-mentioned history left DCIS treated with lumpectomy. She is here to discuss final pathology report. She reports no new problems or concerns. She is healed very well from surgery. She is planning to see radiation oncology later today.  REVIEW OF SYSTEMS:   Constitutional: Denies fevers, chills or abnormal weight loss Eyes: Denies blurriness of vision Ears, nose, mouth, throat, and face: Denies mucositis or sore throat Respiratory: Denies cough, dyspnea or wheezes Cardiovascular: Denies palpitation, chest discomfort or lower extremity  swelling Gastrointestinal:  Denies nausea, heartburn or change in bowel habits Skin: Denies abnormal skin rashes Lymphatics: Denies new lymphadenopathy or easy bruising Neurological:Denies numbness, tingling or new weaknesses Behavioral/Psych: Mood is stable, no new changes  Breast:  denies any pain or lumps or nodules in either breasts All other systems were reviewed with the patient and are negative.  I have reviewed the past medical history, past surgical history, social history and family history with the patient and they are unchanged from previous note.  ALLERGIES:  is allergic to oxycodone.  MEDICATIONS:  Current Outpatient Prescriptions  Medication Sig Dispense Refill  . aspirin 81 MG tablet Take 81 mg by mouth daily.    . Cholecalciferol (VITAMIN D) 2000 UNITS CAPS Take 1 capsule by mouth daily.    . hydrochlorothiazide (HYDRODIURIL) 25 MG tablet Take 25 mg by mouth daily.    Marland Kitchen losartan (COZAAR) 25 MG tablet Take 25 mg by mouth daily.    . Multiple Vitamins-Minerals (MULTIVITAMIN WITH MINERALS) tablet Take 1 tablet by mouth daily.    . Red Yeast Rice Extract (RED YEAST RICE PO) Take 2 capsules by mouth daily.    Marland Kitchen anastrozole (ARIMIDEX) 1 MG tablet Take 1 tablet (1 mg total) by mouth daily. 30 tablet 0  . HYDROcodone-acetaminophen (NORCO/VICODIN) 5-325 MG per tablet Take 1-2 tablets by mouth every 4 (four) hours as needed for moderate pain or severe pain. (Patient not taking: Reported on 07/17/2014) 30 tablet 0   No current facility-administered medications for this visit.    PHYSICAL EXAMINATION: ECOG PERFORMANCE STATUS: 0 - Asymptomatic  Filed Vitals:   07/17/14 0843  BP: 133/54  Pulse:  72  Temp: 98.4 F (36.9 C)  Resp: 19   Filed Weights   07/17/14 0843  Weight: 241 lb 14.4 oz (109.725 kg)    GENERAL:alert, no distress and comfortable SKIN: skin color, texture, turgor are normal, no rashes or significant lesions EYES: normal, Conjunctiva are pink and  non-injected, sclera clear OROPHARYNX:no exudate, no erythema and lips, buccal mucosa, and tongue normal  NECK: supple, thyroid normal size, non-tender, without nodularity LYMPH:  no palpable lymphadenopathy in the cervical, axillary or inguinal LUNGS: clear to auscultation and percussion with normal breathing effort HEART: regular rate & rhythm and no murmurs and no lower extremity edema ABDOMEN:abdomen soft, non-tender and normal bowel sounds Musculoskeletal:no cyanosis of digits and no clubbing  NEURO: alert & oriented x 3 with fluent speech, no focal motor/sensory deficits  LABORATORY DATA:  I have reviewed the data as listed   Chemistry      Component Value Date/Time   NA 142 06/11/2014 0802   NA 141 01/04/2013 0938   K 4.0 06/11/2014 0802   K 3.7 01/04/2013 0938   CL 100 01/04/2013 0938   CO2 29 06/11/2014 0802   CO2 29 01/04/2013 0938   BUN 17.7 06/11/2014 0802   BUN 18 01/04/2013 0938   CREATININE 0.8 06/11/2014 0802   CREATININE 0.62 01/04/2013 0938      Component Value Date/Time   CALCIUM 9.9 06/11/2014 0802   CALCIUM 10.2 01/04/2013 0938   ALKPHOS 83 06/11/2014 0802   AST 32 06/11/2014 0802   ALT 33 06/11/2014 0802   BILITOT 0.86 06/11/2014 0802       Lab Results  Component Value Date   WBC 9.6 06/11/2014   HGB 13.7 06/11/2014   HCT 41.2 06/11/2014   MCV 97.4 06/11/2014   PLT 353 06/11/2014   NEUTROABS 5.6 06/11/2014     ASSESSMENT & PLAN:  Breast cancer of upper-outer quadrant of left female breast Left breast DCIS status post lumpectomy 07/01/2014 ER 100%, PR 96% Right breast fibroadenoma 1.2 cm previously biopsied in 2009  Pathology review: I discussed the final pathology report with the patient and explained that there are no surprises. There is no evidence of invasive breast cancer.  Recommendation: 1. Adjuvant radiation therapy followed by  2. Adjuvant antiestrogen therapy with anastrozole 1 mg daily for 5 years to start approximately June  1  Aromatase under counseling:We discussed the risks and benefits of anti-estrogen therapy with aromatase inhibitors. These include but not limited to insomnia, hot flashes, mood changes, vaginal dryness, bone density loss, and weight gain. Although rare, serious side effects including endometrial cancer, risk of blood clots were also discussed. We strongly believe that the benefits far outweigh the risks. Patient understands these risks and consented to starting treatment. Planned treatment duration is 5 years.  Return to clinic 1 month after starting antiestrogen therapy approximately in July 1 week      No orders of the defined types were placed in this encounter.   The patient has a good understanding of the overall plan. she agrees with it. She will call with any problems that may develop before her next visit here.   Rulon Eisenmenger, MD

## 2014-07-18 ENCOUNTER — Telehealth: Payer: Self-pay | Admitting: *Deleted

## 2014-07-18 NOTE — Telephone Encounter (Signed)
CALLED PATIENT TO INFORM OF SIM ON 07-22-14 @ 8 AM @ DR. Unknown Jim OFFICE, SPOKE WITH PATIENT AND SHE IS AWARE OF THIS APPT.

## 2014-07-18 NOTE — Telephone Encounter (Signed)
CALLED PATIENT TO INFORM OF MAMMOGRAM ON 07-21-14 - ARRIVAL TIME - 9:45 AM @ [facility], SPOKE WITH PATIENT AND SHE IS AWARE OF THIS APPT.

## 2014-07-21 ENCOUNTER — Other Ambulatory Visit: Payer: Self-pay | Admitting: Radiation Oncology

## 2014-07-21 ENCOUNTER — Ambulatory Visit
Admission: RE | Admit: 2014-07-21 | Discharge: 2014-07-21 | Disposition: A | Discharge status: Home / Self Care | Payer: Medicare Other | Source: Ambulatory Visit | Attending: Radiation Oncology | Admitting: Radiation Oncology

## 2014-07-21 DIAGNOSIS — Z853 Personal history of malignant neoplasm of breast: Secondary | ICD-10-CM

## 2014-07-21 DIAGNOSIS — Z9889 Other specified postprocedural states: Secondary | ICD-10-CM

## 2014-07-21 DIAGNOSIS — Z1231 Encounter for screening mammogram for malignant neoplasm of breast: Secondary | ICD-10-CM

## 2014-07-22 ENCOUNTER — Ambulatory Visit
Admission: RE | Admit: 2014-07-22 | Discharge: 2014-07-22 | Disposition: A | Discharge status: Home / Self Care | Payer: Medicare Other | Source: Ambulatory Visit | Attending: Radiation Oncology | Admitting: Radiation Oncology

## 2014-07-22 DIAGNOSIS — C50412 Malignant neoplasm of upper-outer quadrant of left female breast: Secondary | ICD-10-CM

## 2014-07-22 NOTE — Progress Notes (Signed)
Name: Kim Castro   MRN: [id number]  Date:  07/22/2014  DOB: [birth year]  Status:outpatient    DIAGNOSIS: Breast cancer.  CONSENT VERIFIED: yes   SET UP: Patient is setup supine   IMMOBILIZATION:  The following immobilization was used:Custom Moldable Pillow, breast board.   NARRATIVE: [name] was brought to the [facility].  Identity was confirmed.  All relevant records and images related to the planned course of therapy were reviewed.  Then, the patient was positioned in a stable reproducible clinical set-up for radiation therapy.  Wires were placed to delineate the clinical extent of breast tissue. A wire was placed on the scar as well.  CT images were obtained.  An isocenter was placed. Skin markings were placed.  The CT images were loaded into the planning software where the target and avoidance structures were contoured.  The radiation prescription was entered and confirmed. The patient was discharged in stable condition and tolerated simulation well.    TREATMENT PLANNING NOTE:  Treatment planning then occurred. I have requested : MLC's, isodose plan, basic dose calculation  I personally designed and supervised the construction of 3 medically necessary complex treatment devices for the protection of critical normal structures including the lungs and contralateral breast as well as the immobilization device which is necessary for set up certainty.   3D simulation occurred. I requested and analyzed a dose volume histogram of the heart, lungs and lumpectomy cavity.

## 2014-07-22 NOTE — Progress Notes (Signed)
Radiation Oncology         [phone]) [phone] ________________________________  Name: Kim Castro      MRN: [id number]          Date: 07/22/2014              DOB: Jul 18, 1945  Optical Surface Tracking Plan:  Since intensity modulated radiotherapy (IMRT) and 3D conformal radiation treatment methods are predicated on accurate and precise positioning for treatment, intrafraction motion monitoring is medically necessary to ensure accurate and safe treatment delivery.  The ability to quantify intrafraction motion without excessive ionizing radiation dose can only be performed with optical surface tracking. Accordingly, surface imaging offers the opportunity to obtain 3D measurements of patient position throughout IMRT and 3D treatments without excessive radiation exposure.  I am ordering optical surface tracking for this patient's upcoming course of radiotherapy. ________________________________ Signature   Reference:   Ursula Alert, J, et al. Surface imaging-based analysis of intrafraction motion for breast radiotherapy patients.Journal of [place], n. 6, nov. 2014. ISSN [id number].   Available at: <http://www.jacmp.org/index.php/jacmp/article/view/4957>.

## 2014-07-23 ENCOUNTER — Other Ambulatory Visit: Payer: Self-pay

## 2014-07-24 ENCOUNTER — Telehealth: Payer: Self-pay | Admitting: Hematology and Oncology

## 2014-07-24 NOTE — Telephone Encounter (Signed)
Called patient and she is aware of her new appointment in june

## 2014-07-25 DIAGNOSIS — C50412 Malignant neoplasm of upper-outer quadrant of left female breast: Secondary | ICD-10-CM | POA: Diagnosis not present

## 2014-07-29 ENCOUNTER — Ambulatory Visit: Payer: Medicare Other | Admitting: Radiation Oncology

## 2014-07-30 ENCOUNTER — Ambulatory Visit
Admission: RE | Admit: 2014-07-30 | Discharge: 2014-07-30 | Disposition: A | Discharge status: Home / Self Care | Payer: Medicare Other | Source: Ambulatory Visit | Attending: Radiation Oncology | Admitting: Radiation Oncology

## 2014-07-30 ENCOUNTER — Ambulatory Visit: Payer: Medicare Other

## 2014-07-30 DIAGNOSIS — C50412 Malignant neoplasm of upper-outer quadrant of left female breast: Secondary | ICD-10-CM | POA: Diagnosis not present

## 2014-07-31 ENCOUNTER — Ambulatory Visit
Admission: RE | Admit: 2014-07-31 | Discharge: 2014-07-31 | Disposition: A | Discharge status: Home / Self Care | Payer: Medicare Other | Source: Ambulatory Visit | Attending: Radiation Oncology | Admitting: Radiation Oncology

## 2014-07-31 ENCOUNTER — Encounter: Payer: Self-pay | Admitting: *Deleted

## 2014-07-31 ENCOUNTER — Ambulatory Visit: Payer: Medicare Other

## 2014-07-31 DIAGNOSIS — C50412 Malignant neoplasm of upper-outer quadrant of left female breast: Secondary | ICD-10-CM | POA: Diagnosis not present

## 2014-07-31 NOTE — Progress Notes (Signed)
Met with pt for 1st xrt treatment. Relate she is doing well. Denies needs. Encourage pt to call with questions or concerns. Received verbal understanding.

## 2014-08-01 ENCOUNTER — Ambulatory Visit: Admission: RE | Admit: 2014-08-01 | Payer: Medicare Other | Source: Ambulatory Visit

## 2014-08-01 ENCOUNTER — Ambulatory Visit: Payer: Medicare Other

## 2014-08-04 ENCOUNTER — Ambulatory Visit
Admission: RE | Admit: 2014-08-04 | Discharge: 2014-08-04 | Disposition: A | Discharge status: Home / Self Care | Payer: Medicare Other | Source: Ambulatory Visit | Attending: Radiation Oncology | Admitting: Radiation Oncology

## 2014-08-04 ENCOUNTER — Ambulatory Visit: Payer: Medicare Other

## 2014-08-04 DIAGNOSIS — C50412 Malignant neoplasm of upper-outer quadrant of left female breast: Secondary | ICD-10-CM | POA: Diagnosis not present

## 2014-08-04 MED ORDER — RADIAPLEXRX EX GEL
Freq: Once | CUTANEOUS | Status: AC
  Administered 2014-08-04: 11:00:00 via TOPICAL

## 2014-08-04 MED ORDER — ALRA NON-METALLIC DEODORANT (RAD-ONC)
1.0000 "application " | Freq: Once | TOPICAL | Status: AC
  Administered 2014-08-04: 1 via TOPICAL

## 2014-08-05 ENCOUNTER — Ambulatory Visit
Admission: RE | Admit: 2014-08-05 | Discharge: 2014-08-05 | Disposition: A | Discharge status: Home / Self Care | Payer: Medicare Other | Source: Ambulatory Visit | Attending: Radiation Oncology | Admitting: Radiation Oncology

## 2014-08-05 ENCOUNTER — Ambulatory Visit: Payer: Medicare Other

## 2014-08-05 VITALS — BP 104/55 | HR 72 | Temp 98.2°F | Wt 244.7 lb

## 2014-08-05 DIAGNOSIS — C50412 Malignant neoplasm of upper-outer quadrant of left female breast: Secondary | ICD-10-CM | POA: Diagnosis not present

## 2014-08-05 NOTE — Progress Notes (Signed)
Weekly Management Note Current Dose: 7.5  Gy  Projected Dose: 50 Gy   Narrative:  The patient presents for routine under treatment assessment.  CBCT/MVCT images/Port film x-rays were reviewed.  The chart was checked. Doing well. RN education performed.  Physical Findings: Weight: 244 lb 11.2 oz (110.995 kg). Unchanged  Impression:  The patient is tolerating radiation.  Plan:  Continue treatment as planned. Start radiaplex.

## 2014-08-06 ENCOUNTER — Ambulatory Visit: Payer: Medicare Other

## 2014-08-06 DIAGNOSIS — C50412 Malignant neoplasm of upper-outer quadrant of left female breast: Secondary | ICD-10-CM | POA: Diagnosis not present

## 2014-08-07 ENCOUNTER — Ambulatory Visit
Admission: RE | Admit: 2014-08-07 | Discharge: 2014-08-07 | Disposition: A | Discharge status: Home / Self Care | Payer: Medicare Other | Source: Ambulatory Visit | Attending: Radiation Oncology | Admitting: Radiation Oncology

## 2014-08-07 DIAGNOSIS — C50412 Malignant neoplasm of upper-outer quadrant of left female breast: Secondary | ICD-10-CM

## 2014-08-07 MED ORDER — RADIAPLEXRX EX GEL
Freq: Once | CUTANEOUS | Status: AC
  Administered 2014-08-07: 12:00:00 via TOPICAL

## 2014-08-07 MED ORDER — ALRA NON-METALLIC DEODORANT (RAD-ONC)
1.0000 "application " | Freq: Once | TOPICAL | Status: AC
  Administered 2014-08-07: 1 via TOPICAL

## 2014-08-08 ENCOUNTER — Ambulatory Visit
Admission: RE | Admit: 2014-08-08 | Discharge: 2014-08-08 | Disposition: A | Discharge status: Home / Self Care | Payer: Medicare Other | Source: Ambulatory Visit | Attending: Radiation Oncology | Admitting: Radiation Oncology

## 2014-08-08 DIAGNOSIS — C50412 Malignant neoplasm of upper-outer quadrant of left female breast: Secondary | ICD-10-CM | POA: Diagnosis not present

## 2014-08-11 ENCOUNTER — Ambulatory Visit: Payer: Medicare Other

## 2014-08-11 DIAGNOSIS — C50412 Malignant neoplasm of upper-outer quadrant of left female breast: Secondary | ICD-10-CM | POA: Diagnosis not present

## 2014-08-12 ENCOUNTER — Ambulatory Visit
Admission: RE | Admit: 2014-08-12 | Discharge: 2014-08-12 | Disposition: A | Discharge status: Home / Self Care | Payer: Medicare Other | Source: Ambulatory Visit | Attending: Radiation Oncology | Admitting: Radiation Oncology

## 2014-08-12 VITALS — BP 93/59 | HR 77 | Temp 98.3°F | Wt 244.4 lb

## 2014-08-12 DIAGNOSIS — C50412 Malignant neoplasm of upper-outer quadrant of left female breast: Secondary | ICD-10-CM

## 2014-08-12 NOTE — Progress Notes (Signed)
Weekly assessment of radiation to left breast.Completed 8 of 20 treatments.Skin pink.Denies pain.Mild fatigue.Continue application of radiaplex twice daily.

## 2014-08-12 NOTE — Progress Notes (Signed)
Weekly Management Note Current Dose: 20  Gy  Projected Dose: 50 Gy   Narrative:  The patient presents for routine under treatment assessment.  CBCT/MVCT images/Port film x-rays were reviewed.  The chart was checked. Doing well. No complaints.   Physical Findings: Weight: 244 lb 6.4 oz (110.859 kg). Unchanged  Impression:  The patient is tolerating radiation.  Plan:  Continue treatment as planned. Continue radiaplex.

## 2014-08-13 ENCOUNTER — Ambulatory Visit: Payer: Medicare Other

## 2014-08-13 DIAGNOSIS — C50412 Malignant neoplasm of upper-outer quadrant of left female breast: Secondary | ICD-10-CM | POA: Diagnosis not present

## 2014-08-14 ENCOUNTER — Ambulatory Visit: Payer: Medicare Other

## 2014-08-14 DIAGNOSIS — C50412 Malignant neoplasm of upper-outer quadrant of left female breast: Secondary | ICD-10-CM | POA: Diagnosis not present

## 2014-08-15 ENCOUNTER — Ambulatory Visit: Payer: Medicare Other

## 2014-08-15 DIAGNOSIS — C50412 Malignant neoplasm of upper-outer quadrant of left female breast: Secondary | ICD-10-CM | POA: Diagnosis not present

## 2014-08-18 ENCOUNTER — Ambulatory Visit
Admission: RE | Admit: 2014-08-18 | Discharge: 2014-08-18 | Disposition: A | Discharge status: Home / Self Care | Payer: Medicare Other | Source: Ambulatory Visit | Attending: Radiation Oncology | Admitting: Radiation Oncology

## 2014-08-18 ENCOUNTER — Ambulatory Visit: Payer: Medicare Other

## 2014-08-18 DIAGNOSIS — C50412 Malignant neoplasm of upper-outer quadrant of left female breast: Secondary | ICD-10-CM | POA: Diagnosis not present

## 2014-08-19 ENCOUNTER — Ambulatory Visit
Admission: RE | Admit: 2014-08-19 | Discharge: 2014-08-19 | Disposition: A | Discharge status: Home / Self Care | Payer: Medicare Other | Source: Ambulatory Visit | Attending: Radiation Oncology | Admitting: Radiation Oncology

## 2014-08-19 ENCOUNTER — Ambulatory Visit: Payer: Medicare Other

## 2014-08-19 DIAGNOSIS — C50412 Malignant neoplasm of upper-outer quadrant of left female breast: Secondary | ICD-10-CM

## 2014-08-19 NOTE — Progress Notes (Signed)
Weekly Management Note Current Dose: 32.5  Gy  Projected Dose: 50 Gy   Narrative:  The patient presents for routine under treatment assessment.  CBCT/MVCT images/Port film x-rays were reviewed.  The chart was checked. Doing well. Saw on tx. 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   Physical Findings: Weight:  . Minimal pink skin.   Impression:  The patient is tolerating radiation.  Plan:  Continue treatment as planned.

## 2014-08-20 ENCOUNTER — Ambulatory Visit
Admission: RE | Admit: 2014-08-20 | Discharge: 2014-08-20 | Disposition: A | Discharge status: Home / Self Care | Payer: Medicare Other | Source: Ambulatory Visit | Attending: Radiation Oncology | Admitting: Radiation Oncology

## 2014-08-20 ENCOUNTER — Ambulatory Visit: Payer: Medicare Other

## 2014-08-20 DIAGNOSIS — C50412 Malignant neoplasm of upper-outer quadrant of left female breast: Secondary | ICD-10-CM | POA: Diagnosis not present

## 2014-08-21 ENCOUNTER — Ambulatory Visit
Admission: RE | Admit: 2014-08-21 | Discharge: 2014-08-21 | Disposition: A | Discharge status: Home / Self Care | Payer: Medicare Other | Source: Ambulatory Visit | Attending: Radiation Oncology | Admitting: Radiation Oncology

## 2014-08-21 ENCOUNTER — Ambulatory Visit: Payer: Medicare Other

## 2014-08-21 DIAGNOSIS — C50412 Malignant neoplasm of upper-outer quadrant of left female breast: Secondary | ICD-10-CM | POA: Diagnosis not present

## 2014-08-22 ENCOUNTER — Ambulatory Visit
Admission: RE | Admit: 2014-08-22 | Discharge: 2014-08-22 | Disposition: A | Discharge status: Home / Self Care | Payer: Medicare Other | Source: Ambulatory Visit | Attending: Radiation Oncology | Admitting: Radiation Oncology

## 2014-08-22 ENCOUNTER — Ambulatory Visit: Payer: Medicare Other

## 2014-08-22 DIAGNOSIS — C50412 Malignant neoplasm of upper-outer quadrant of left female breast: Secondary | ICD-10-CM | POA: Diagnosis not present

## 2014-08-25 ENCOUNTER — Ambulatory Visit: Payer: Medicare Other

## 2014-08-25 ENCOUNTER — Ambulatory Visit
Admission: RE | Admit: 2014-08-25 | Discharge: 2014-08-25 | Disposition: A | Discharge status: Home / Self Care | Payer: Medicare Other | Source: Ambulatory Visit | Attending: Radiation Oncology | Admitting: Radiation Oncology

## 2014-08-25 ENCOUNTER — Encounter: Payer: Self-pay | Admitting: Adult Health

## 2014-08-25 DIAGNOSIS — C50412 Malignant neoplasm of upper-outer quadrant of left female breast: Secondary | ICD-10-CM | POA: Diagnosis not present

## 2014-08-25 NOTE — Progress Notes (Signed)
I met with Ms. Levengood briefly after her radiation therapy treatment today.  She is scheduled to complete treatment on 08/28/14.  I gave her the "Life After Cancer for Every Survivor" survivorship pamphlet, along with her follow-up appointments.   She will see Dr. Pablo Ledger for her 35-month Radiation Oncology follow-up on: 09/25/14 at 8:30am  She will then see me in the Survivorship Clinic on: 09/25/14 at [facility].   Ms. Wenberg was given a card with these appointments written on it and she expressed verbal understanding.  She was encouraged to call with any questions/concerns she may have before her next appointment here.   Mike Craze, NP [facility] [phone]

## 2014-08-26 ENCOUNTER — Ambulatory Visit: Payer: Medicare Other

## 2014-08-26 ENCOUNTER — Ambulatory Visit
Admission: RE | Admit: 2014-08-26 | Discharge: 2014-08-26 | Disposition: A | Discharge status: Home / Self Care | Payer: Medicare Other | Source: Ambulatory Visit | Attending: Radiation Oncology | Admitting: Radiation Oncology

## 2014-08-26 ENCOUNTER — Encounter: Payer: Self-pay | Admitting: Radiation Oncology

## 2014-08-26 VITALS — BP 143/75 | HR 70 | Temp 98.3°F | Resp 20 | Wt 245.3 lb

## 2014-08-26 DIAGNOSIS — C50412 Malignant neoplasm of upper-outer quadrant of left female breast: Secondary | ICD-10-CM

## 2014-08-26 NOTE — Progress Notes (Signed)
Weekly rad txs 18/20 completed left breast, erythema,, dernm atitis on chest, c/o itching at times, skin intact, using radiaplex bid, gave fit strong flyer,patient not interested in [facility],  Doesn't need another r radiaplex gel lotion stated patient, can start lotion with vitamin e in a couple weeks, sees Dr. Lindi Adie June 1st, will start arimidex then 10:05 AM BP 143/75 mmHg  Pulse 70  Temp(Src) 98.3 F (36.8 C) (Oral)  Resp 20  Wt 245 lb 4.8 oz (111.267 kg)  Wt Readings from Last 3 Encounters:  08/12/14 244 lb 6.4 oz (110.859 kg)  07/17/14 241 lb 14.4 oz (109.725 kg)  07/01/14 239 lb (108.41 kg)

## 2014-08-26 NOTE — Progress Notes (Signed)
Weekly Management Note Current Dose: 45 Gy  Projected Dose: 50 Gy   Narrative:  The patient presents for routine under treatment assessment.  CBCT/MVCT images/Port film x-rays were reviewed.  The chart was checked. Doing well. No complaints.   Physical Findings: Weight: 245 lb 4.8 oz (111.267 kg). Pink skin over left breast. No moist desquamation.   Impression:  The patient is tolerating radiation.  Plan:  Continue treatment as planned. Continue radiaplex. Discussed post RT skin care. Follow up 1 month.

## 2014-08-27 ENCOUNTER — Ambulatory Visit
Admission: RE | Admit: 2014-08-27 | Discharge: 2014-08-27 | Disposition: A | Discharge status: Home / Self Care | Payer: Medicare Other | Source: Ambulatory Visit | Attending: Radiation Oncology | Admitting: Radiation Oncology

## 2014-08-27 ENCOUNTER — Ambulatory Visit: Payer: Medicare Other

## 2014-08-27 DIAGNOSIS — C50412 Malignant neoplasm of upper-outer quadrant of left female breast: Secondary | ICD-10-CM | POA: Diagnosis not present

## 2014-08-28 ENCOUNTER — Ambulatory Visit
Admission: RE | Admit: 2014-08-28 | Discharge: 2014-08-28 | Disposition: A | Discharge status: Home / Self Care | Payer: Medicare Other | Source: Ambulatory Visit | Attending: Radiation Oncology | Admitting: Radiation Oncology

## 2014-08-28 ENCOUNTER — Ambulatory Visit: Payer: Medicare Other

## 2014-08-28 ENCOUNTER — Encounter: Payer: Self-pay | Admitting: Radiation Oncology

## 2014-08-28 DIAGNOSIS — C50412 Malignant neoplasm of upper-outer quadrant of left female breast: Secondary | ICD-10-CM | POA: Diagnosis not present

## 2014-08-29 ENCOUNTER — Ambulatory Visit: Payer: Medicare Other

## 2014-09-01 ENCOUNTER — Ambulatory Visit: Payer: Medicare Other

## 2014-09-01 ENCOUNTER — Telehealth: Payer: Self-pay | Admitting: *Deleted

## 2014-09-01 NOTE — Telephone Encounter (Signed)
Left message for a return phone call to follow up with patient post XRT.  Awaiting patient response.

## 2014-09-02 ENCOUNTER — Ambulatory Visit: Payer: Medicare Other

## 2014-09-04 NOTE — Progress Notes (Signed)
  Radiation Oncology         [phone]) [phone] ________________________________  Name: Kim Castro MRN: [id number]  Date: 08/28/2014  DOB: [birth year]  End of Treatment Note  Diagnosis:   Breast cancer of upper-outer quadrant of left female breast   Staging form: Breast, AJCC 7th Edition     Clinical stage from 06/11/2014: Stage 0 (Tis (DCIS), N0, M0) - Unsigned     Pathologic stage from 07/03/2014: Stage Unknown (Tis (DCIS), NX, cM0) - Signed by Enid Cutter, MD on 07/10/2014       Staging comments: Staged on final lumpectomy specimen by Dr. Donato Heinz.  Indication for treatment:  Curative     Radiation treatment dates:  07/31/2014-08/28/2014  Site/dose:   Left breast/ 42.5 Gy at 2.5 Gy per fraction x 17 fractions.  Left breast boost/ 7.5 Gy at 2.5 Gy per fraction x 3 fractions  Beams/energy:  Opposed tangents with reduced fields /10 and 6 MV photons Enface electrons / 15 MeV  Narrative: The patient tolerated radiation treatment relatively well.   She had moderate dry desquamation in the medial breast.   Plan: The patient has completed radiation treatment. The patient will return to radiation oncology clinic for routine followup in one month. I advised them to call or return sooner if they have any questions or concerns related to their recovery or treatment.  ------------------------------------------------  Thea Silversmith, MD

## 2014-09-14 NOTE — Progress Notes (Addendum)
Name: Kim Castro   MRN: [id number]  Date: 08/06/14  DOB: 1946-04-27  Status:outpatient    DIAGNOSIS: Breast cancer of upper-outer quadrant of left female breast   Staging form: Breast, AJCC 7th Edition     Clinical stage from 06/11/2014: Stage 0 (Tis (DCIS), N0, M0) - Unsigned     Pathologic stage from 07/03/2014: Stage Unknown (Tis (DCIS), NX, cM0) - Signed by Enid Cutter, MD on 07/10/2014       Staging comments: Staged on final lumpectomy specimen by Dr. Donato Heinz.  CONSENT VERIFIED: yes   SET UP: Patient is setup supine   IMMOBILIZATION:  The following immobilization was used:Custom Moldable Pillow, breast board.   NARRATIVE: [name] underwent complex simulation and treatment planning for her boost treatment today.  Her tumor volume was outlined on the planning CT scan. The depth of her cavity was felt to be appropriate for treatment with electrons    15  MeV electrons will be prescribed to the 100%  isodose line.   I personally oversaw and approved the construction of a unique block which will be used for beam modification purposes.  An isodose plan is requested.

## 2014-09-24 NOTE — Progress Notes (Signed)
   Department of Radiation Oncology  Phone:  [phone] Fax:        [phone]   Name: Kim Castro MRN: [id number]  DOB: 1946-01-16  Date: 09/25/2014  Follow Up Visit Note  Diagnosis: Breast cancer of upper-outer quadrant of left female breast   Staging form: Breast, AJCC 7th Edition     Clinical stage from 06/11/2014: Stage 0 (Tis (DCIS), N0, M0) - Unsigned     Pathologic stage from 07/03/2014: Stage Unknown (Tis (DCIS), NX, cM0) - Signed by Enid Cutter, MD on 07/10/2014       Staging comments: Staged on final lumpectomy specimen by Dr. Donato Heinz.  Summary and Interval since last radiation: 08/28/2014 Site/dose: Left breast/ 42.5 Gy at 2.5 Gy per fraction x 17 fractions.  Left breast boost/ 7.5 Gy at 2.5 Gy per fraction x 3 fractions  Interval History: Kim Castro presents today for routine follow up. She is doing well and her skin has healed up well. She has a scheduled appointment with Dr. Lindi Adie next week. She hasn't started the anastrozole. She is scheduled to start on June 1st.  Physical Exam:  Filed Vitals:   09/25/14 0833  BP: 116/56  Pulse: 72  Temp: 98.2 F (36.8 C)  Weight: 240 lb 12.8 oz (109.226 kg)  Dry skin over the left breast.  IMPRESSION: Kim Castro is a 69 y.o. female with DCIS of the left breast with resolving acute effects of radiation.  PLAN: She is doing well. We discussed the need for follow up every 4-6 months which she has scheduled.  We discussed the need for yearly mammograms which she can schedule with her OBGYN or with medical oncology. We discussed the need for sun protection in the treated area.  She can always call me with questions.  I will follow up with her on an as needed basis. She met with our survivorship navigator after the encounter.  This document serves as a record of services personally performed by Thea Silversmith, MD. It was created on her behalf by Darcus Austin, a trained medical scribe. The creation of this record is based on the scribe's  personal observations and the provider's statements to them. This document has been checked and approved by the attending provider.    Thea Silversmith, MD

## 2014-09-25 ENCOUNTER — Ambulatory Visit
Admission: RE | Admit: 2014-09-25 | Discharge: 2014-09-25 | Disposition: A | Discharge status: Home / Self Care | Payer: Medicare Other | Source: Ambulatory Visit | Attending: Radiation Oncology | Admitting: Radiation Oncology

## 2014-09-25 ENCOUNTER — Ambulatory Visit (HOSPITAL_BASED_OUTPATIENT_CLINIC_OR_DEPARTMENT_OTHER): Payer: Medicare Other | Admitting: Adult Health

## 2014-09-25 ENCOUNTER — Encounter: Payer: Self-pay | Admitting: Adult Health

## 2014-09-25 VITALS — BP 112/53 | HR 72 | Temp 98.2°F

## 2014-09-25 VITALS — BP 116/56 | HR 72 | Temp 98.2°F | Wt 240.8 lb

## 2014-09-25 DIAGNOSIS — C50412 Malignant neoplasm of upper-outer quadrant of left female breast: Secondary | ICD-10-CM

## 2014-09-25 DIAGNOSIS — Z853 Personal history of malignant neoplasm of breast: Secondary | ICD-10-CM

## 2014-09-25 NOTE — Progress Notes (Signed)
CLINIC:  Cancer Survivorship   REASON FOR VISIT:  Routine follow-up post-treatment for a recent history of breast cancer.  BRIEF ONCOLOGIC HISTORY:    Breast cancer of upper-outer quadrant of left female breast   05/29/2014 Initial Biopsy Left breast needle core biopsy: Grade 2, DCIS with calcs. ER+ (100%), PR+ (96%).    06/04/2014 Initial Diagnosis Left breast DCIS with calcifications, ER 100%, PR 96%   06/10/2014 Breast MRI Left breast: 2.4 x 1.3 x 1.1 cm area of patchy non-mass enhancement upper outer quadrant includes postbiopsy seroma; Right breast: 1.2 cm previously biopsied stable benign fibroadenoma   06/12/2014 Procedure Genetic counseling/testing: Identified 1 VUS on CHEK2 gene. Remainder of 17 gene panel tested negative and included: ATM, BARD1, BRCA 1/2, BRIP1, CDH1, CHEK2, EPCAM, MLH1, MSH2, MSH6, NBN, NF1, PALB2, PTEN, RAD50, RAD51C, RAD51D, STK11, and TP53.    07/01/2014 Surgery Left breast lumpectomy ([facility]): Grade 1, DCIS, spanning 2.3 cm, 1 mm margin, ER 100%, PR 96%   07/31/2014 - 08/28/2014 Radiation Therapy Adjuvant RT completed Pablo Ledger). Left breast: Total dose 42.5 Gy over 17 fractions. Left breast boost: Total dose 7.5 Gy over 3 fractions.     Anti-estrogen oral therapy Anastrazole 1mg  daily. Planned duration of treatment: 5 years Lindi Castro)   09/25/2014 Survivorship Survivorship Care Plan given to patient and reviewed with her in person.      INTERVAL HISTORY:  Kim Castro presents to the [facility] today for our initial meeting to review her survivorship care plan detailing her treatment course for breast cancer, as well as monitoring long-term side effects of that treatment, education regarding health maintenance, screening, and overall wellness and health promotion.     Overall, Kim Castro reports feeling quite well since completing her radiation therapy approximately one month ago.  She has no physical side effects as a result of her cancer treatments.  She has  not yet started her anti-estrogen therapy with anastrazole, but plans to do so soon.  She does endorse some anxiety and fear of cancer recurrence given her significant family history of cancer.  She expresses concern regarding her sister, who has had 2 different cancer diagnoses, and now finished with treatment as well.  She is looking forward to seeing her this weekend in [place], as she plans to travel there for the holiday weekend.  Kim Castro has a good support system in her children, who all live locally, and her 5 grandchildren ages 34-21.    REVIEW OF SYSTEMS:  General: Denies fever, chills, unintentional weight loss, or generalized fatigue.  Cardiac: Denies palpitations, chest pain, and lower extremity edema.  Respiratory: Denies cough, shortness of breath, and dyspnea on exertion.  GI: Denies abdominal pain, constipation, diarrhea, nausea, or vomiting.  GU: Denies dysuria, hematuria, vaginal bleeding, vaginal discharge, or vaginal dryness.  Musculoskeletal: Denies joint or bone pain.  Neuro: Denies headache or recent falls. Denies peripheral neuropathy. Skin: Denies rash, pruritis, or open wounds.  Breast: Denies any new nodularity, masses, tenderness, nipple changes, or nipple discharge.  Psych: Denies depression, anxiety, insomnia, or memory loss.   A 14-point review of systems was completed and was negative, except as noted above.   ONCOLOGY TREATMENT TEAM:  1. Surgeon:  Dr. Saddie Benders at [facility] 2. Medical Oncologist: Dr. Lindi Castro  3. Radiation Oncologist: Dr. Pablo Ledger    PAST MEDICAL/SURGICAL HISTORY:  Past Medical History  Diagnosis Date  . Arthritis   . Hypertension     takes Losartan daily and HCTZ  . Family history  of anesthesia complication     sister slow to wake up with anesthesia  . History of bronchitis     > 27yrs ago  . Seasonal allergies     takes Claritin prn  . Dizziness     > 53yrs ago;took Antivert   . Numbness     to toes on each  foot  . Joint pain   . Vitamin D deficiency     takes VIt D daily  . GERD (gastroesophageal reflux disease)     takes occasional TUMs  . History of colon polyps   . Diverticulosis   . Urinary frequency   . Cataract     immature on the left  . Breast cancer of upper-outer quadrant of left female breast 06/04/2014  . Diabetes mellitus without complication   . Family history of breast cancer   . Family history of colon cancer   . Family history of uterine cancer   . Radiation 07/31/14-08/28/14    Left Breast 20 fxs   Past Surgical History  Procedure Laterality Date  . Tubal ligation    . Total knee arthroplasty Left 10/24/2012    Procedure: TOTAL KNEE ARTHROPLASTY;  Surgeon: Kerin Salen, MD;  Location: [facility];  Service: Orthopedics;  Laterality: Left;  . Colonoscopy    . Cataract extraction Right   . Total knee arthroplasty Right 01/09/2013    Procedure: TOTAL KNEE ARTHROPLASTY;  Surgeon: Kerin Salen, MD;  Location: [facility];  Service: Orthopedics;  Laterality: Right;  . Breast lumpectomy with radioactive seed localization Left 07/01/2014    Procedure: LEFT BREAST LUMPECTOMY WITH RADIOACTIVE SEED LOCALIZATION;  Surgeon: Excell Seltzer, MD;  Location: [facility];  Service: General;  Laterality: Left;     ALLERGIES:  Allergies  Allergen Reactions  . Oxycodone Nausea And Vomiting     CURRENT MEDICATIONS:  Current Outpatient Prescriptions on File Prior to Visit  Medication Sig Dispense Refill  . anastrozole (ARIMIDEX) 1 MG tablet Take 1 tablet (1 mg total) by mouth daily. (Patient not taking: Reported on 09/25/2014) 30 tablet 0  . aspirin 81 MG tablet Take 81 mg by mouth daily.    . Cholecalciferol (VITAMIN D) 2000 UNITS CAPS Take 1 capsule by mouth daily.    . hydrochlorothiazide (HYDRODIURIL) 25 MG tablet Take 25 mg by mouth daily.    Marland Kitchen losartan (COZAAR) 25 MG tablet Take 25 mg by mouth daily.    . Multiple Vitamins-Minerals (MULTIVITAMIN WITH MINERALS) tablet Take  1 tablet by mouth daily.    . Red Yeast Rice Extract (RED YEAST RICE PO) Take 2 capsules by mouth daily.     No current facility-administered medications on file prior to visit.     ONCOLOGIC FAMILY HISTORY:  Family History  Problem Relation Age of Onset  . Uterine cancer Mother     deceased 67  . Breast cancer Sister 25    currently 82  . Colon cancer Brother 66    currently 82  . Cancer Maternal Uncle     unk. primary; deceased 31s  . Thyroid cancer Daughter 13    currently 80; type?     GENETIC COUNSELING/TESTING: Completed on 06/12/14: Identified 1 VUS on CHEK2 gene. Remainder of 17 gene panel tested negative and included: ATM, BARD1, BRCA 1/2, BRIP1, CDH1, CHEK2, EPCAM, MLH1, MSH2, MSH6, NBN, NF1, PALB2, PTEN, RAD50, RAD51C, RAD51D, STK11, and TP53.   SOCIAL HISTORY:  WYNNE ROZAK lives in [place] and has 3 children, 2  daughters and 1 son. She also has 5 grandchildren.  She is retired and is able to spend a lot of her time with her grandchildren, which she enjoys. She denies any current  tobacco, alcohol, or illicit drug use.     PHYSICAL EXAMINATION:  Vital Signs:   Filed Vitals:   09/25/14 0834  BP: 112/53  Pulse: 72  Temp: 98.2 F (36.8 C)   General: Well-nourished, well-appearing female in no acute distress.  She is unaccompanied today.   HEENT: Head is atraumatic and normocephalic.  Pupils equal and reactive to light and accomodation. Conjunctivae clear without exudate.  Sclerae anicteric. Oral mucosa is pink, moist, and intact without lesions.  Oropharynx is pink without lesions or erythema.  Lymph: No cervical, supraclavicular, infraclavicular, or axillary lymphadenopathy noted on palpation.  Cardiovascular: Regular rate and rhythm without murmurs, rubs, or gallops. Respiratory: Clear to auscultation bilaterally. Chest expansion symmetric without accessory muscle use on inspiration or expiration.  GI: Abdomen soft and round. No tenderness to palpation.  Bowel sounds normoactive in 4 quadrants. No hepatosplenomegaly.   GU: Deferred.  Musculoskeletal: Muscle strength 5/5 in all extremities.  Full ROM noted in all extremities.  Neuro: No focal deficits. Steady gait.  Psych: Mood and affect normal and appropriate for situation.  Extremities: No edema, cyanosis, or clubbing.  Skin: Warm and dry. No open lesions noted.   LABORATORY DATA:  None for this visit.  DIAGNOSTIC IMAGING:  None for this visit.     ASSESSMENT AND PLAN:   1. History of breast cancer:  Kim Castro will follow-up with her medical oncologist, Dr. Lindi Castro in 10/2014 with history and physical exam per surveillance protocol.  As of today, she has not started her anti-estrogen therapy with anastrazole.  She plans to start the medication soon.  She was instructed to make Dr. Lindi Castro or myself aware if she begins to experience any side effects of the medication and I could see her back in clinic to help manage those side effects, as needed. Though the incidence is low, there is an associated risk of endometrial cancer with anti-estrogen therapies like anastrazole.  Kim Castro was encouraged to contact Dr. Lindi Castro or myself with any vaginal bleeding while taking anastrazole. Other side effects of anastrazole were again reviewed with her as well. A comprehensive survivorship care plan and treatment summary was reviewed with the patient today detailing her breast cancer diagnosis, treatment course, potential late/long-term effects of treatment, appropriate follow-up care with recommendations for the future, and patient education resources.  A copy of this summary, along with a letter will be sent to the patient's primary care provider via mail/fax/In Basket message after today's visit.  Kim Castro is welcome to return to the Survivorship Clinic in the future, as needed; no follow-up will be scheduled at this time.    2. Fear of cancer recurrence: This is one of the most common reported emotional  side effects of a cancer diagnosis and treatment.  She is very worried about one of her sisters, who has been diagnosed with 2 different cancers and often has trouble expressing her emotions.  Kim Castro expresses concern for her sister and is aware that cancer is common in their family and is a constant reminder of what they all have been through.  I offered expressive supportive counseling to Kim Castro today as she explored the tremendous trauma her family has endured as a result of multiple cancer diagnoses. We discussed the difficulty in dealing with uncertainty and the  loss of control that comes with a cancer diagnosis and subsequent treatment. I encouraged her to reach out to our Chaplain, Lorrin Jackson, who would be a wonderful resource to help Kim Castro further explore her personal and family struggles with cancer.  I have contacted Lattie Haw on Kim Castro's behalf and hope she can provide additional support for the patient. Formal counseling may also be of benefit for Kim Castro as well.  3. Bone health:  Given Kim Castro's age, history of breast cancer, and her current treatment regimen including anti-estrogen therapy with anastrazole, she is at risk for bone demineralization.  Kim Castro would be eligible for a DEXA scan as clinically indicated.  In the meantime, she was encouraged to increase her consumption of foods rich in calcium, as well as increase her weight-bearing activities.  She was given education on specific activities to promote bone health.  4. Cancer screening:  Due to Kim Castro's history and her age, she should receive screening for skin cancers, colon cancer, and gynecologic cancers.  The information and recommendations are listed on the patient's comprehensive care plan/treatment summary and were reviewed in detail with the patient.    5. Health maintenance and wellness promotion: Kim Castro was encouraged to consume 5-7 servings of fruits and vegetables per day. She was also encouraged to  engage in moderate to vigorous exercise for 30 minutes per day most days of the week. We discussed the [employer] fitness program, which is designed for cancer survivors to help them become more physically fit after cancer treatments.  She is going to consider participating in [place] and will contact them directly if she decides to participate.  She was also instructed to limit her alcohol consumption and continue to abstain from tobacco use.   6. Support services/counseling: It is not uncommon for this period of the patient's cancer care trajectory to be one of many emotions and stressors.  We discussed an opportunity for her to participate in the next session of [facility] ("Finding Your New Normal") support group series designed for patients after they have completed treatment. She was given information regarding this support group and has the contact information needed to get registered if she chooses to participate.  Ms. Hobart was encouraged to take advantage of our many other support services programs, support groups, and/or counseling in coping with her new life as a cancer survivor after completing anti-cancer treatment.  She was offered support today through active listening and expressive supportive counseling.  She was given information regarding our available services and encouraged to contact me with any questions or for help enrolling in any of our support group/programs.    A total of 55 minutes of face-to-face time was spent with this patient with greater than 50% of that time in counseling and care-coordination.   Mike Craze, NP [facility] [phone]   Note: PRIMARY CARE PROVIDER Gennette Pac, [place] [phone]

## 2014-10-01 ENCOUNTER — Encounter: Payer: Self-pay | Admitting: General Practice

## 2014-10-01 ENCOUNTER — Other Ambulatory Visit: Payer: Self-pay | Admitting: *Deleted

## 2014-10-01 DIAGNOSIS — C50412 Malignant neoplasm of upper-outer quadrant of left female breast: Secondary | ICD-10-CM

## 2014-10-01 NOTE — Progress Notes (Signed)
Spiritual Care Note  Referred by Mike Craze, NP for emotional support.  Have been unable to reach pt by phone, so sent a handwritten notecard of introduction to offer support.  Will attempt phone f/u, but please also page as needs arise or as pt has occasion to be on campus.  Thank you.  [place], [place]

## 2014-10-02 NOTE — Assessment & Plan Note (Signed)
Left breast DCIS status post lumpectomy 07/01/2014 ER 100%, PR 96% Right breast fibroadenoma 1.2 cm previously biopsied in 2009 Status post adjuvant radiation therapy completed April 2016, started anastrozole 1 mg daily 09/14/2014  Anastrozole toxicities:  Breast Cancer Surveillance: 1. Breast exam every 6 months 2. Mammogram annually  Return to clinic in 6 months for follow-up

## 2014-10-03 ENCOUNTER — Telehealth: Payer: Self-pay | Admitting: Hematology and Oncology

## 2014-10-03 ENCOUNTER — Encounter: Payer: Self-pay | Admitting: *Deleted

## 2014-10-03 ENCOUNTER — Ambulatory Visit (HOSPITAL_BASED_OUTPATIENT_CLINIC_OR_DEPARTMENT_OTHER): Payer: Medicare Other | Admitting: Hematology and Oncology

## 2014-10-03 ENCOUNTER — Other Ambulatory Visit (HOSPITAL_BASED_OUTPATIENT_CLINIC_OR_DEPARTMENT_OTHER): Payer: Medicare Other

## 2014-10-03 VITALS — BP 138/56 | HR 73 | Temp 98.7°F | Resp 18 | Ht 65.0 in | Wt 240.0 lb

## 2014-10-03 DIAGNOSIS — D0512 Intraductal carcinoma in situ of left breast: Secondary | ICD-10-CM

## 2014-10-03 DIAGNOSIS — C50412 Malignant neoplasm of upper-outer quadrant of left female breast: Secondary | ICD-10-CM

## 2014-10-03 DIAGNOSIS — Z170 Estrogen receptor positive status [ER+]: Secondary | ICD-10-CM

## 2014-10-03 LAB — CBC WITH DIFFERENTIAL/PLATELET
BASO%: 1.1 % (ref 0.0–2.0)
Basophils Absolute: 0.1 10*3/uL (ref 0.0–0.1)
EOS ABS: 0.2 10*3/uL (ref 0.0–0.5)
EOS%: 1.8 % (ref 0.0–7.0)
HCT: 38.1 % (ref 34.8–46.6)
HEMOGLOBIN: 13 g/dL (ref 11.6–15.9)
LYMPH#: 2 10*3/uL (ref 0.9–3.3)
LYMPH%: 24.3 % (ref 14.0–49.7)
MCH: 32 pg (ref 25.1–34.0)
MCHC: 34.1 g/dL (ref 31.5–36.0)
MCV: 93.9 fL (ref 79.5–101.0)
MONO#: 0.6 10*3/uL (ref 0.1–0.9)
MONO%: 7 % (ref 0.0–14.0)
NEUT#: 5.4 10*3/uL (ref 1.5–6.5)
NEUT%: 65.8 % (ref 38.4–76.8)
Platelets: 310 10*3/uL (ref 145–400)
RBC: 4.06 10*6/uL (ref 3.70–5.45)
RDW: 13.8 % (ref 11.2–14.5)
WBC: 8.1 10*3/uL (ref 3.9–10.3)

## 2014-10-03 LAB — COMPREHENSIVE METABOLIC PANEL (CC13)
ALT: 13 U/L (ref 0–55)
ANION GAP: 10 meq/L (ref 3–11)
AST: 18 U/L (ref 5–34)
Albumin: 3.7 g/dL (ref 3.5–5.0)
Alkaline Phosphatase: 74 U/L (ref 40–150)
BUN: 17.8 mg/dL (ref 7.0–26.0)
CHLORIDE: 105 meq/L (ref 98–109)
CO2: 29 mEq/L (ref 22–29)
Calcium: 9.7 mg/dL (ref 8.4–10.4)
Creatinine: 0.8 mg/dL (ref 0.6–1.1)
EGFR: 78 mL/min/{1.73_m2} — ABNORMAL LOW (ref >90)
Glucose: 133 mg/dl (ref 70–140)
Potassium: 4.6 mEq/L (ref 3.5–5.1)
Sodium: 144 mEq/L (ref 136–145)
TOTAL PROTEIN: 7 g/dL (ref 6.4–8.3)
Total Bilirubin: 0.98 mg/dL (ref 0.20–1.20)

## 2014-10-03 NOTE — Progress Notes (Signed)
Patient Care Team: Hulan Fess, MD as PCP - General (Family Medicine) Excell Seltzer, MD as Consulting Physician (General Surgery) Rolm Bookbinder, MD as Consulting Physician (General Surgery) Thea Silversmith, MD as Consulting Physician (Radiation Oncology) Rockwell Germany, RN as Registered Nurse Mauro Kaufmann, RN as Registered Nurse Holley Bouche, NP as Nurse Practitioner (Nurse Practitioner)  DIAGNOSIS: Breast cancer of upper-outer quadrant of left female breast   Staging form: Breast, AJCC 7th Edition     Clinical stage from 06/11/2014: Stage 0 (Tis (DCIS), N0, M0) - Unsigned     Pathologic stage from 07/03/2014: Stage Unknown (Tis (DCIS), NX, cM0) - Signed by Enid Cutter, MD on 07/10/2014       Staging comments: Staged on final lumpectomy specimen by Dr. Donato Heinz.    SUMMARY OF ONCOLOGIC HISTORY:   Breast cancer of upper-outer quadrant of left female breast   05/29/2014 Initial Biopsy Left breast needle core biopsy: Grade 2, DCIS with calcs. ER+ (100%), PR+ (96%).    06/04/2014 Initial Diagnosis Left breast DCIS with calcifications, ER 100%, PR 96%   06/10/2014 Breast MRI Left breast: 2.4 x 1.3 x 1.1 cm area of patchy non-mass enhancement upper outer quadrant includes postbiopsy seroma; Right breast: 1.2 cm previously biopsied stable benign fibroadenoma   06/12/2014 Procedure Genetic counseling/testing: Identified 1 VUS on CHEK2 gene. Remainder of 17 gene panel tested negative and included: ATM, BARD1, BRCA 1/2, BRIP1, CDH1, CHEK2, EPCAM, MLH1, MSH2, MSH6, NBN, NF1, PALB2, PTEN, RAD50, RAD51C, RAD51D, STK11, and TP53.    07/01/2014 Surgery Left breast lumpectomy ([facility]): Grade 1, DCIS, spanning 2.3 cm, 1 mm margin, ER 100%, PR 96%   07/31/2014 - 08/28/2014 Radiation Therapy Adjuvant RT completed Pablo Ledger). Left breast: Total dose 42.5 Gy over 17 fractions. Left breast boost: Total dose 7.5 Gy over 3 fractions.    09/14/2014 -  Anti-estrogen oral therapy Anastrazole 59m daily. Planned  duration of treatment: 5 years (Lindi Adie   09/25/2014 Survivorship Survivorship Care Plan given to patient and reviewed with her in person.     CHIEF COMPLIANT: Breast cancer follow-up on anastrozole  INTERVAL HISTORY: EFREYJA GOVEAis a 69year old with above-mentioned history of left breast cancer currently on anastrozole and tolerating it extremely well without any major problems or concerns. She denies any hot flashes although she does feel warmer than usual. Denies any muscle aches or pains. 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 He gets a lot of exercise from doing all her back.  REVIEW OF SYSTEMS:   Constitutional: Denies fevers, chills or abnormal weight loss Eyes: Denies blurriness of vision Ears, nose, mouth, throat, and face: Denies mucositis or sore throat Respiratory: Denies cough, dyspnea or wheezes Cardiovascular: Denies palpitation, chest discomfort or lower extremity swelling Gastrointestinal:  Denies nausea, heartburn or change in bowel habits Skin: Denies abnormal skin rashes Lymphatics: Denies new lymphadenopathy or easy bruising Neurological:Denies numbness, tingling or new weaknesses Behavioral/Psych: Mood is stable, no new changes  Breast:  denies any pain or lumps or nodules in either breasts All other systems were reviewed with the patient and are negative.  I have reviewed the past medical history, past surgical history, social history and family history with the patient and they are unchanged from previous note.  ALLERGIES:  is allergic to oxycodone.  MEDICATIONS:  Current Outpatient Prescriptions  Medication Sig Dispense Refill  . anastrozole (ARIMIDEX) 1 MG tablet Take 1 tablet (1 mg total)  by mouth daily. 30 tablet 0  . aspirin 81 MG tablet Take 81 mg by mouth daily.    . Cholecalciferol (VITAMIN D) 2000 UNITS CAPS Take 1 capsule by mouth daily.    . hydrochlorothiazide (HYDRODIURIL) 25 MG  tablet Take 25 mg by mouth daily.    Marland Kitchen losartan (COZAAR) 25 MG tablet Take 25 mg by mouth daily.    . Multiple Vitamins-Minerals (MULTIVITAMIN WITH MINERALS) tablet Take 1 tablet by mouth daily.    . Red Yeast Rice Extract (RED YEAST RICE PO) Take 2 capsules by mouth daily.     No current facility-administered medications for this visit.    PHYSICAL EXAMINATION: ECOG PERFORMANCE STATUS: 0 - Asymptomatic  Filed Vitals:   10/03/14 0927  BP: 138/56  Pulse: 73  Temp: 98.7 F (37.1 C)  Resp: 18   Filed Weights   10/03/14 0927  Weight: 240 lb (108.863 kg)    GENERAL:alert, no distress and comfortable SKIN: skin color, texture, turgor are normal, no rashes or significant lesions EYES: normal, Conjunctiva are pink and non-injected, sclera clear OROPHARYNX:no exudate, no erythema and lips, buccal mucosa, and tongue normal  NECK: supple, thyroid normal size, non-tender, without nodularity LYMPH:  no palpable lymphadenopathy in the cervical, axillary or inguinal LUNGS: clear to auscultation and percussion with normal breathing effort HEART: regular rate & rhythm and no murmurs and no lower extremity edema ABDOMEN:abdomen soft, non-tender and normal bowel sounds Musculoskeletal:no cyanosis of digits and no clubbing  NEURO: alert & oriented x 3 with fluent speech, no focal motor/sensory deficits   LABORATORY DATA:  I have reviewed the data as listed   Chemistry      Component Value Date/Time   NA 142 06/11/2014 0802   NA 141 01/04/2013 0938   K 4.0 06/11/2014 0802   K 3.7 01/04/2013 0938   CL 100 01/04/2013 0938   CO2 29 06/11/2014 0802   CO2 29 01/04/2013 0938   BUN 17.7 06/11/2014 0802   BUN 18 01/04/2013 0938   CREATININE 0.8 06/11/2014 0802   CREATININE 0.62 01/04/2013 0938      Component Value Date/Time   CALCIUM 9.9 06/11/2014 0802   CALCIUM 10.2 01/04/2013 0938   ALKPHOS 83 06/11/2014 0802   AST 32 06/11/2014 0802   ALT 33 06/11/2014 0802   BILITOT 0.86  06/11/2014 0802       Lab Results  Component Value Date   WBC 8.1 10/03/2014   HGB 13.0 10/03/2014   HCT 38.1 10/03/2014   MCV 93.9 10/03/2014   PLT 310 10/03/2014   NEUTROABS 5.4 10/03/2014   ASSESSMENT & PLAN:  Breast cancer of upper-outer quadrant of left female breast Left breast DCIS status post lumpectomy 07/01/2014 ER 100%, PR 96% Right breast fibroadenoma 1.2 cm previously biopsied in 2009 Status post adjuvant radiation therapy completed April 2016, started anastrozole 1 mg daily 09/14/2014  Anastrozole toxicities: No major side effects of anastrozole. She denies any hot flashes or muscle aches or any other problems.  Breast Cancer Surveillance: 1. Breast exam every 6 months starting with the next visit 2. Mammogram annually  Return to clinic in 6 months for follow-up    No orders of the defined types were placed in this encounter.   The patient has a good understanding of the overall plan. she agrees with it. she will call with any problems that may develop before the next visit here.   Rulon Eisenmenger, MD

## 2014-10-03 NOTE — Progress Notes (Signed)
Met with pt post radiation. Relate she is doing well and a "little tired". Denies needs or complaints at this time. Encourage pt to call with questions. Received verbal understanding.

## 2014-10-03 NOTE — Telephone Encounter (Signed)
Appointments made and avs printed for patient °

## 2014-10-13 ENCOUNTER — Other Ambulatory Visit: Payer: Self-pay | Admitting: *Deleted

## 2014-10-13 DIAGNOSIS — C50412 Malignant neoplasm of upper-outer quadrant of left female breast: Secondary | ICD-10-CM

## 2014-10-13 MED ORDER — ANASTROZOLE 1 MG PO TABS: 1.0000 mg | ORAL_TABLET | Freq: Every day | ORAL | Status: DC

## 2014-10-31 ENCOUNTER — Ambulatory Visit: Payer: Medicare Other | Admitting: Hematology and Oncology

## 2014-12-04 ENCOUNTER — Encounter: Payer: Self-pay | Admitting: General Practice

## 2014-12-04 NOTE — Progress Notes (Signed)
Spiritual Care Note  Reached Kim Castro by phone, introducing Spiritual Care as a support resource and building rapport as a step toward further support as pt desires.  She used opportunity to explore and process some of her family history with cancer, from the death of her mother when she herself was 69 years old and had several younger siblings at home to her younger sister's struggles with breast and ovarian cancer.  Per pt, these experiences have helped her understand her own situation as "lucky" and much more desirable; this perspective helps her cope and move into survivorship with decreased anxiety.  She is aware of ongoing chaplain availability for support, but please also page as needs arise.  Thank you.  [place], [place] Pager [phone] Voicemail  [phone]

## 2015-04-01 ENCOUNTER — Encounter: Payer: Self-pay | Admitting: Adult Health

## 2015-04-01 NOTE — Progress Notes (Signed)
A birthday card was mailed to the patient today on behalf of the Survivorship Program at [facility].   Jabir Dahlem, NP Survivorship Program [facility] [phone]  

## 2015-04-02 NOTE — Assessment & Plan Note (Signed)
Left breast DCIS status post lumpectomy 07/01/2014 ER 100%, PR 96% Right breast fibroadenoma 1.2 cm previously biopsied in 2009 Status post adjuvant radiation therapy completed April 2016, started anastrozole 1 mg daily 09/14/2014  Anastrozole toxicities: No major side effects of anastrozole. She denies any hot flashes or muscle aches or any other problems.  Breast Cancer Surveillance: 1. Breast exam 04/03/2015 is normal 2. Mammogram annually to be done in January 2017  Return to clinic in 6 months for follow-up

## 2015-04-03 ENCOUNTER — Telehealth: Payer: Self-pay | Admitting: Hematology and Oncology

## 2015-04-03 ENCOUNTER — Ambulatory Visit (HOSPITAL_BASED_OUTPATIENT_CLINIC_OR_DEPARTMENT_OTHER): Payer: Medicare Other | Admitting: Hematology and Oncology

## 2015-04-03 ENCOUNTER — Encounter: Payer: Self-pay | Admitting: Hematology and Oncology

## 2015-04-03 VITALS — BP 119/66 | HR 92 | Temp 98.4°F | Resp 18 | Ht 65.0 in | Wt 247.4 lb

## 2015-04-03 DIAGNOSIS — D0512 Intraductal carcinoma in situ of left breast: Secondary | ICD-10-CM | POA: Diagnosis not present

## 2015-04-03 DIAGNOSIS — C50412 Malignant neoplasm of upper-outer quadrant of left female breast: Secondary | ICD-10-CM

## 2015-04-03 NOTE — Addendum Note (Signed)
Addended by: Prentiss Bells on: 04/03/2015 06:21 PM   Modules accepted: Medications

## 2015-04-03 NOTE — Progress Notes (Signed)
Patient Care Team: Hulan Fess, MD as PCP - General (Family Medicine) Excell Seltzer, MD as Consulting Physician (General Surgery) Rolm Bookbinder, MD as Consulting Physician (General Surgery) Thea Silversmith, MD as Consulting Physician (Radiation Oncology) Rockwell Germany, RN as Registered Nurse Mauro Kaufmann, RN as Registered Nurse Holley Bouche, NP as Nurse Practitioner (Nurse Practitioner)  DIAGNOSIS: Breast cancer of upper-outer quadrant of left female breast [facility])   Staging form: Breast, AJCC 7th Edition     Clinical stage from 06/11/2014: Stage 0 (Tis (DCIS), N0, M0) - Unsigned     Pathologic stage from 07/03/2014: Stage Unknown (Tis (DCIS), NX, cM0) - Signed by Enid Cutter, MD on 07/10/2014       Staging comments: Staged on final lumpectomy specimen by Dr. Donato Heinz.    SUMMARY OF ONCOLOGIC HISTORY:   Breast cancer of upper-outer quadrant of left female breast ([place])   05/29/2014 Initial Biopsy Left breast needle core biopsy: Grade 2, DCIS with calcs. ER+ (100%), PR+ (96%).    06/04/2014 Initial Diagnosis Left breast DCIS with calcifications, ER 100%, PR 96%   06/10/2014 Breast MRI Left breast: 2.4 x 1.3 x 1.1 cm area of patchy non-mass enhancement upper outer quadrant includes postbiopsy seroma; Right breast: 1.2 cm previously biopsied stable benign fibroadenoma   06/12/2014 Procedure Genetic counseling/testing: Identified 1 VUS on CHEK2 gene. Remainder of 17 gene panel tested negative and included: ATM, BARD1, BRCA 1/2, BRIP1, CDH1, CHEK2, EPCAM, MLH1, MSH2, MSH6, NBN, NF1, PALB2, PTEN, RAD50, RAD51C, RAD51D, STK11, and TP53.    07/01/2014 Surgery Left breast lumpectomy ([facility]): Grade 1, DCIS, spanning 2.3 cm, 1 mm margin, ER 100%, PR 96%   07/31/2014 - 08/28/2014 Radiation Therapy Adjuvant RT completed Pablo Ledger). Left breast: Total dose 42.5 Gy over 17 fractions. Left breast boost: Total dose 7.5 Gy over 3 fractions.    09/14/2014 -  Anti-estrogen oral therapy Anastrazole 1mg   daily. Planned duration of treatment: 5 years [facility]i Adie)   09/25/2014 Survivorship Survivorship Care Plan given to patient and reviewed with her in person.     CHIEF COMPLIANT: Follow-up on anastrozole  INTERVAL HISTORY: Kim Castro is a 69 year old above-mentioned history of left breast DCIS currently on oral antiestrogen therapy with anastrozole. She is tolerating it extremely well without any major problems or concerns. She denies any hot flashes or myalgias. She denies any lumps or nodules in the breasts. Her health has been good.  REVIEW OF SYSTEMS:   Constitutional: Denies fevers, chills or abnormal weight loss Eyes: Denies blurriness of vision Ears, nose, mouth, throat, and face: Denies mucositis or sore throat Respiratory: Denies cough, dyspnea or wheezes Cardiovascular: Denies palpitation, chest discomfort or lower extremity swelling Gastrointestinal:  Denies nausea, heartburn or change in bowel habits Skin: Denies abnormal skin rashes Lymphatics: Denies new lymphadenopathy or easy bruising Neurological:Denies numbness, tingling or new weaknesses Behavioral/Psych: Mood is stable, no new changes  Breast:  denies any pain or lumps or nodules in either breasts All other systems were reviewed with the patient and are negative.  I have reviewed the past medical history, past surgical history, social history and family history with the patient and they are unchanged from previous note.  ALLERGIES:  is allergic to oxycodone.  MEDICATIONS:  Current Outpatient Prescriptions  Medication Sig Dispense Refill  . anastrozole (ARIMIDEX) 1 MG tablet Take 1 tablet (1 mg total) by mouth daily. 30 tablet 5  . aspirin 81 MG tablet Take 81 mg by mouth daily.    . Cholecalciferol (VITAMIN D)  2000 UNITS CAPS Take 1 capsule by mouth daily.    . hydrochlorothiazide (HYDRODIURIL) 25 MG tablet Take 25 mg by mouth daily.    Marland Kitchen losartan (COZAAR) 25 MG tablet Take 25 mg by mouth daily.    . Multiple  Vitamins-Minerals (MULTIVITAMIN WITH MINERALS) tablet Take 1 tablet by mouth daily.    . Red Yeast Rice Extract (RED YEAST RICE PO) Take 2 capsules by mouth daily.     No current facility-administered medications for this visit.    PHYSICAL EXAMINATION: ECOG PERFORMANCE STATUS: 0 - Asymptomatic  Filed Vitals:   04/03/15 0937  BP: 119/66  Pulse: 92  Temp: 98.4 F (36.9 C)  Resp: 18   Filed Weights   04/03/15 0937  Weight: 247 lb 6.4 oz (112.22 kg)    GENERAL:alert, no distress and comfortable SKIN: skin color, texture, turgor are normal, no rashes or significant lesions EYES: normal, Conjunctiva are pink and non-injected, sclera clear OROPHARYNX:no exudate, no erythema and lips, buccal mucosa, and tongue normal  NECK: supple, thyroid normal size, non-tender, without nodularity LYMPH:  no palpable lymphadenopathy in the cervical, axillary or inguinal LUNGS: clear to auscultation and percussion with normal breathing effort HEART: regular rate & rhythm and no murmurs and no lower extremity edema ABDOMEN:abdomen soft, non-tender and normal bowel sounds Musculoskeletal:no cyanosis of digits and no clubbing  NEURO: alert & oriented x 3 with fluent speech, no focal motor/sensory deficits BREAST: reveals a palpable nodularity at the site of the lumpectomy which is the same as before. Rest of the breast and axillary exam is normal.. No palpable axillary supraclavicular or infraclavicular adenopathy no breast tenderness or nipple discharge. (exam performed in the presence of a chaperone)  LABORATORY DATA:  I have reviewed the data as listed   Chemistry      Component Value Date/Time   NA 144 10/03/2014 0914   NA 141 01/04/2013 0938   K 4.6 10/03/2014 0914   K 3.7 01/04/2013 0938   CL 100 01/04/2013 0938   CO2 29 10/03/2014 0914   CO2 29 01/04/2013 0938   BUN 17.8 10/03/2014 0914   BUN 18 01/04/2013 0938   CREATININE 0.8 10/03/2014 0914   CREATININE 0.62 01/04/2013 0938        Component Value Date/Time   CALCIUM 9.7 10/03/2014 0914   CALCIUM 10.2 01/04/2013 0938   ALKPHOS 74 10/03/2014 0914   AST 18 10/03/2014 0914   ALT 13 10/03/2014 0914   BILITOT 0.98 10/03/2014 0914       Lab Results  Component Value Date   WBC 8.1 10/03/2014   HGB 13.0 10/03/2014   HCT 38.1 10/03/2014   MCV 93.9 10/03/2014   PLT 310 10/03/2014   NEUTROABS 5.4 10/03/2014    ASSESSMENT & PLAN:  Breast cancer of upper-outer quadrant of left female breast Left breast DCIS status post lumpectomy 07/01/2014 ER 100%, PR 96% Right breast fibroadenoma 1.2 cm previously biopsied in 2009 Status post adjuvant radiation therapy completed April 2016, started anastrozole 1 mg daily 09/14/2014  Anastrozole toxicities: No major side effects of anastrozole. She denies any hot flashes or muscle aches or any other problems.  Breast Cancer Surveillance: 1. Breast exam 04/03/2015 reveals a palpable nodularity at the site of the lumpectomy which is the same as before. Rest of the breast and axillary exam is normal. 2. Mammogram annually to be done in January 2017 3. I recommended that she get a bone density test next year at the same time as her mammogram.  Return to clinic in 6 months for follow-up    Orders Placed This Encounter  Procedures  . DG Bone Density    Standing Status: Future     Number of Occurrences:      Standing Expiration Date: 04/02/2016    Order Specific Question:  Reason for Exam (SYMPTOM  OR DIAGNOSIS REQUIRED)    Answer:  Post menopausal Osteoporosis evaluation    Order Specific Question:  Preferred imaging location?    Answer:  [facility]   The patient has a good understanding of the overall plan. she agrees with it. she will call with any problems that may develop before the next visit here.   Rulon Eisenmenger, MD 04/03/2015

## 2015-04-03 NOTE — Telephone Encounter (Signed)
Gave and printed appt sched and avs for pt for June 2017 °

## 2015-04-06 ENCOUNTER — Other Ambulatory Visit: Payer: Self-pay | Admitting: Oncology

## 2015-04-21 ENCOUNTER — Other Ambulatory Visit: Payer: Self-pay | Admitting: Hematology and Oncology

## 2015-04-21 DIAGNOSIS — C50412 Malignant neoplasm of upper-outer quadrant of left female breast: Secondary | ICD-10-CM

## 2015-05-13 ENCOUNTER — Other Ambulatory Visit: Payer: Self-pay | Admitting: Hematology and Oncology

## 2015-05-13 DIAGNOSIS — Z9889 Other specified postprocedural states: Secondary | ICD-10-CM

## 2015-05-13 DIAGNOSIS — Z853 Personal history of malignant neoplasm of breast: Secondary | ICD-10-CM

## 2015-05-15 ENCOUNTER — Ambulatory Visit
Admission: RE | Admit: 2015-05-15 | Discharge: 2015-05-15 | Disposition: A | Discharge status: Home / Self Care | Payer: Medicare Other | Source: Ambulatory Visit | Attending: Hematology and Oncology | Admitting: Hematology and Oncology

## 2015-05-15 DIAGNOSIS — Z853 Personal history of malignant neoplasm of breast: Secondary | ICD-10-CM

## 2015-05-15 DIAGNOSIS — Z9889 Other specified postprocedural states: Secondary | ICD-10-CM

## 2015-06-09 ENCOUNTER — Ambulatory Visit
Admission: RE | Admit: 2015-06-09 | Discharge: 2015-06-09 | Disposition: A | Discharge status: Home / Self Care | Payer: Medicare Other | Source: Ambulatory Visit | Attending: Hematology and Oncology | Admitting: Hematology and Oncology

## 2015-06-09 DIAGNOSIS — C50412 Malignant neoplasm of upper-outer quadrant of left female breast: Secondary | ICD-10-CM

## 2015-06-09 DIAGNOSIS — M8589 Other specified disorders of bone density and structure, multiple sites: Secondary | ICD-10-CM | POA: Diagnosis not present

## 2015-06-29 DIAGNOSIS — I10 Essential (primary) hypertension: Secondary | ICD-10-CM | POA: Diagnosis not present

## 2015-06-29 DIAGNOSIS — E119 Type 2 diabetes mellitus without complications: Secondary | ICD-10-CM | POA: Diagnosis not present

## 2015-10-07 NOTE — Assessment & Plan Note (Signed)
Left breast DCIS status post lumpectomy 07/01/2014 ER 100%, PR 96% Right breast fibroadenoma 1.2 cm previously biopsied in 2009 Status post adjuvant radiation therapy completed April 2016, started anastrozole 1 mg daily 09/14/2014  Anastrozole toxicities: No major side effects of anastrozole. She denies any hot flashes or muscle aches or any other problems.  Breast Cancer Surveillance: 1. Breast exam 10/09/2015 reveals a palpable nodularity at the site of the lumpectomy which is the same as before. Rest of the breast and axillary exam is normal. 2. Mammogram 05/15/2015: Benign, breast density category B 3. Bone density T score +1.1 normal  Return to clinic in 6 months for follow-up

## 2015-10-09 ENCOUNTER — Encounter: Payer: Self-pay | Admitting: Hematology and Oncology

## 2015-10-09 ENCOUNTER — Telehealth: Payer: Self-pay | Admitting: Hematology and Oncology

## 2015-10-09 ENCOUNTER — Ambulatory Visit (HOSPITAL_BASED_OUTPATIENT_CLINIC_OR_DEPARTMENT_OTHER): Payer: Medicare Other | Admitting: Hematology and Oncology

## 2015-10-09 VITALS — BP 151/67 | HR 76 | Temp 98.7°F | Resp 18 | Ht 65.0 in | Wt 252.1 lb

## 2015-10-09 DIAGNOSIS — C50412 Malignant neoplasm of upper-outer quadrant of left female breast: Secondary | ICD-10-CM

## 2015-10-09 NOTE — Progress Notes (Signed)
Patient Care Team: Kim Fess, MD as PCP - General (Family Medicine) Kim Seltzer, MD as Consulting Physician (General Surgery) Kim Bookbinder, MD as Consulting Physician (General Surgery) Kim Silversmith, MD as Consulting Physician (Radiation Oncology) Kim Germany, RN as Registered Nurse Kim Kaufmann, RN as Registered Nurse Kim Bouche, NP as Nurse Practitioner (Nurse Practitioner)  DIAGNOSIS: Breast cancer of upper-outer quadrant of left female breast [facility])   Staging form: Breast, AJCC 7th Edition     Clinical stage from 06/11/2014: Stage 0 (Tis (DCIS), N0, M0) - Unsigned     Pathologic stage from 07/03/2014: Stage Unknown (Tis (DCIS), NX, cM0) - Signed by Kim Cutter, MD on 07/10/2014       Staging comments: Staged on final lumpectomy specimen by Dr. Donato Castro.    SUMMARY OF ONCOLOGIC HISTORY:   Breast cancer of upper-outer quadrant of left female breast ([place])   05/29/2014 Initial Biopsy Left breast needle core biopsy: Grade 2, DCIS with calcs. ER+ (100%), PR+ (96%).    06/04/2014 Initial Diagnosis Left breast DCIS with calcifications, ER 100%, PR 96%   06/10/2014 Breast MRI Left breast: 2.4 x 1.3 x 1.1 cm area of patchy non-mass enhancement upper outer quadrant includes postbiopsy seroma; Right breast: 1.2 cm previously biopsied stable benign fibroadenoma   06/12/2014 Procedure Genetic counseling/testing: Identified 1 VUS on CHEK2 gene. Remainder of 17 gene panel tested negative and included: ATM, BARD1, BRCA 1/2, BRIP1, CDH1, CHEK2, EPCAM, MLH1, MSH2, MSH6, NBN, NF1, PALB2, PTEN, RAD50, RAD51C, RAD51D, STK11, and TP53.    07/01/2014 Surgery Left breast lumpectomy ([facility]): Grade 1, DCIS, spanning 2.3 cm, 1 mm margin, ER 100%, PR 96%   07/31/2014 - 08/28/2014 Radiation Therapy Adjuvant RT completed Kim Castro). Left breast: Total dose 42.5 Gy over 17 fractions. Left breast boost: Total dose 7.5 Gy over 3 fractions.    09/14/2014 -  Anti-estrogen oral therapy Anastrazole 81m  daily. Planned duration of treatment: 5 years (Kim Castro   09/25/2014 Survivorship Survivorship Care Plan given to patient and reviewed with her in person.     CHIEF COMPLIANT: Follow-up on anastrozole  INTERVAL HISTORY: Kim BACCHIis a 70year old with above-mentioned history of left breast cancer who underwent colectomy followed by adjuvant radiation and is currently on antiestrogen therapy with anastrozole. She is tolerating anastrozole extremely well. She reports very mild hot flashes and muscle aches. Denies any lumps or nodules in the breasts. Her daughter was diagnosed with breast cancer who underwent bilateral mastectomies. Previously she had been tested for genetics and it was found to be normal.  REVIEW OF SYSTEMS:   Constitutional: Denies fevers, chills or abnormal weight loss Eyes: Denies blurriness of vision Ears, nose, mouth, throat, and face: Denies mucositis or sore throat Respiratory: Denies cough, dyspnea or wheezes Cardiovascular: Denies palpitation, chest discomfort Gastrointestinal:  Denies nausea, heartburn or change in bowel habits Skin: Denies abnormal skin rashes Lymphatics: Denies new lymphadenopathy or easy bruising Neurological:Denies numbness, tingling or new weaknesses Behavioral/Psych: Mood is stable, no new changes  Extremities: No lower extremity edema Breast:  denies any pain or lumps or nodules in either breasts All other systems were reviewed with the patient and are negative.  I have reviewed the past medical history, past surgical history, social history and family history with the patient and they are unchanged from previous note.  ALLERGIES:  is allergic to oxycodone.  MEDICATIONS:  Current Outpatient Prescriptions  Medication Sig Dispense Refill  . anastrozole (ARIMIDEX) 1 MG tablet TAKE ONE TABLET BY MOUTH ONCE  DAILY 90 tablet 3  . aspirin 81 MG tablet Take 81 mg by mouth daily.    . Cholecalciferol (VITAMIN D) 2000 UNITS CAPS Take 1 capsule  by mouth daily.    . hydrochlorothiazide (HYDRODIURIL) 25 MG tablet Take 25 mg by mouth daily.    Marland Kitchen losartan (COZAAR) 25 MG tablet Take 25 mg by mouth daily.    . Multiple Vitamins-Minerals (MULTIVITAMIN WITH MINERALS) tablet Take 1 tablet by mouth daily.    . Red Yeast Rice Extract (RED YEAST RICE PO) Take 2 capsules by mouth daily.     No current facility-administered medications for this visit.    PHYSICAL EXAMINATION: ECOG PERFORMANCE STATUS: 0 - Asymptomatic  Filed Vitals:   10/09/15 0812  BP: 151/67  Pulse: 76  Temp: 98.7 F (37.1 C)  Resp: 18   Filed Weights   10/09/15 0812  Weight: 252 lb 1.6 oz (114.352 kg)    GENERAL:alert, no distress and comfortable SKIN: skin color, texture, turgor are normal, no rashes or significant lesions EYES: normal, Conjunctiva are pink and non-injected, sclera clear OROPHARYNX:no exudate, no erythema and lips, buccal mucosa, and tongue normal  NECK: supple, thyroid normal size, non-tender, without nodularity LYMPH:  no palpable lymphadenopathy in the cervical, axillary or inguinal LUNGS: clear to auscultation and percussion with normal breathing effort HEART: regular rate & rhythm and no murmurs and no lower extremity edema ABDOMEN:abdomen soft, non-tender and normal bowel sounds MUSCULOSKELETAL:no cyanosis of digits and no clubbing  NEURO: alert & oriented x 3 with fluent speech, no focal motor/sensory deficits EXTREMITIES: No lower extremity edema BREAST:Postsurgical changes in the left breast. No palpable axillary supraclavicular or infraclavicular adenopathy no breast tenderness or nipple discharge. (exam performed in the presence of a chaperone)  LABORATORY DATA:  I have reviewed the data as listed   Chemistry      Component Value Date/Time   NA 144 10/03/2014 0914   NA 141 01/04/2013 0938   K 4.6 10/03/2014 0914   K 3.7 01/04/2013 0938   CL 100 01/04/2013 0938   CO2 29 10/03/2014 0914   CO2 29 01/04/2013 0938   BUN 17.8  10/03/2014 0914   BUN 18 01/04/2013 0938   CREATININE 0.8 10/03/2014 0914   CREATININE 0.62 01/04/2013 0938      Component Value Date/Time   CALCIUM 9.7 10/03/2014 0914   CALCIUM 10.2 01/04/2013 0938   ALKPHOS 74 10/03/2014 0914   AST 18 10/03/2014 0914   ALT 13 10/03/2014 0914   BILITOT 0.98 10/03/2014 0914       Lab Results  Component Value Date   WBC 8.1 10/03/2014   HGB 13.0 10/03/2014   HCT 38.1 10/03/2014   MCV 93.9 10/03/2014   PLT 310 10/03/2014   NEUTROABS 5.4 10/03/2014     ASSESSMENT & PLAN:  Breast cancer of upper-outer quadrant of left female breast Left breast DCIS status post lumpectomy 07/01/2014 ER 100%, PR 96% Right breast fibroadenoma 1.2 cm previously biopsied in 2009 Status post adjuvant radiation therapy completed April 2016, started anastrozole 1 mg daily 09/14/2014  Anastrozole toxicities: No major side effects of anastrozole. She denies any hot flashes or muscle aches or any other problems.  Breast Cancer Surveillance: 1. Breast exam 10/09/2015 reveals a palpable nodularity at the site of the lumpectomy which is the same as before. Rest of the breast and axillary exam is normal. 2. Mammogram 05/15/2015: Benign, breast density category B 3. Bone density T score +1.1 normal  Return to clinic in  6 months for follow-up and after that we can see her annually.    No orders of the defined types were placed in this encounter.   The patient has a good understanding of the overall plan. she agrees with it. she will call with any problems that may develop before the next visit here.   Rulon Eisenmenger, MD 10/09/2015

## 2015-10-09 NOTE — Telephone Encounter (Signed)
cld & left a,message of time & date appt for 12/8@8 :15

## 2015-11-06 DIAGNOSIS — R399 Unspecified symptoms and signs involving the genitourinary system: Secondary | ICD-10-CM | POA: Diagnosis not present

## 2016-03-25 DIAGNOSIS — N390 Urinary tract infection, site not specified: Secondary | ICD-10-CM | POA: Diagnosis not present

## 2016-04-07 NOTE — Assessment & Plan Note (Signed)
Left breast DCIS status post lumpectomy 07/01/2014 ER 100%, PR 96% Right breast fibroadenoma 1.2 cm previously biopsied in 2009 Status post adjuvant radiation therapy completed April 2016, started anastrozole 1 mg daily 09/14/2014  Anastrozole toxicities: No major side effects of anastrozole. She denies any hot flashes or muscle aches or any other problems.  Breast Cancer Surveillance: 1. Breast exam 04/07/16 reveals a palpable nodularity at the site of the lumpectomy which is the same as before. Rest of the breast and axillary exam is normal. 2. Mammogram 05/15/2015: Benign, breast density category B 3. Bone density T score +1.1 normal  Return to clinic in 1 year for follow up

## 2016-04-08 ENCOUNTER — Encounter: Payer: Self-pay | Admitting: Hematology and Oncology

## 2016-04-08 ENCOUNTER — Ambulatory Visit (HOSPITAL_BASED_OUTPATIENT_CLINIC_OR_DEPARTMENT_OTHER): Payer: Medicare Other | Admitting: Hematology and Oncology

## 2016-04-08 DIAGNOSIS — Z170 Estrogen receptor positive status [ER+]: Secondary | ICD-10-CM

## 2016-04-08 DIAGNOSIS — C50412 Malignant neoplasm of upper-outer quadrant of left female breast: Secondary | ICD-10-CM | POA: Diagnosis not present

## 2016-04-08 NOTE — Progress Notes (Signed)
Patient Care Team: Hulan Fess, MD as PCP - General (Family Medicine) Excell Seltzer, MD as Consulting Physician (General Surgery) Rolm Bookbinder, MD as Consulting Physician (General Surgery) Thea Silversmith, MD as Consulting Physician (Radiation Oncology) Rockwell Germany, RN as Registered Nurse Mauro Kaufmann, RN as Registered Nurse Holley Bouche, NP as Nurse Practitioner (Nurse Practitioner)  DIAGNOSIS:  Encounter Diagnosis  Name Primary?  . Malignant neoplasm of upper-outer quadrant of left breast in female, estrogen receptor positive ([place])     SUMMARY OF ONCOLOGIC HISTORY:   Breast cancer of upper-outer quadrant of left female breast ([place])   05/29/2014 Initial Biopsy    Left breast needle core biopsy: Grade 2, DCIS with calcs. ER+ (100%), PR+ (96%).       06/04/2014 Initial Diagnosis    Left breast DCIS with calcifications, ER 100%, PR 96%      06/10/2014 Breast MRI    Left breast: 2.4 x 1.3 x 1.1 cm area of patchy non-mass enhancement upper outer quadrant includes postbiopsy seroma; Right breast: 1.2 cm previously biopsied stable benign fibroadenoma      06/12/2014 Procedure    Genetic counseling/testing: Identified 1 VUS on CHEK2 gene. Remainder of 17 gene panel tested negative and included: ATM, BARD1, BRCA 1/2, BRIP1, CDH1, CHEK2, EPCAM, MLH1, MSH2, MSH6, NBN, NF1, PALB2, PTEN, RAD50, RAD51C, RAD51D, STK11, and TP53.       07/01/2014 Surgery    Left breast lumpectomy ([facility]): Grade 1, DCIS, spanning 2.3 cm, 1 mm margin, ER 100%, PR 96%      07/31/2014 - 08/28/2014 Radiation Therapy    Adjuvant RT completed Pablo Ledger). Left breast: Total dose 42.5 Gy over 17 fractions. Left breast boost: Total dose 7.5 Gy over 3 fractions.       09/14/2014 -  Anti-estrogen oral therapy    Anastrazole 17m daily. Planned duration of treatment: 5 years (Lindi Adie      09/25/2014 Survivorship    Survivorship Care Plan given to patient and reviewed with her in person.         CHIEF COMPLIANT: Follow-up on anastrozole  INTERVAL HISTORY: ECATHLINE DOWENis a 70year old with above-mentioned history of left breast cancer treated with lumpectomy and radiation and is currently on anastrozole since May 2016. Patient is tolerating anastrozole fairly well. She denies any hot flashes she has chronic muscle stiffness in the morning. This does not appear to be bothering her. She has been helping her son take care of her 3 grandchildren. This has been wiping her out. She tells me that the grandchildren will be with her until January 4. She is stressed out taking care of him.  REVIEW OF SYSTEMS:   Constitutional: Denies fevers, chills or abnormal weight loss Eyes: Denies blurriness of vision Ears, nose, mouth, throat, and face: Denies mucositis or sore throat Respiratory: Denies cough, dyspnea or wheezes Cardiovascular: Denies palpitation, chest discomfort Gastrointestinal:  Denies nausea, heartburn or change in bowel habits Skin: Denies abnormal skin rashes Lymphatics: Denies new lymphadenopathy or easy bruising Neurological:Denies numbness, tingling or new weaknesses Behavioral/Psych: Mood is stable, no new changes  Extremities: No lower extremity edema Breast:  denies any pain or lumps or nodules in either breasts All other systems were reviewed with the patient and are negative.  I have reviewed the past medical history, past surgical history, social history and family history with the patient and they are unchanged from previous note.  ALLERGIES:  is allergic to oxycodone.  MEDICATIONS:  Current Outpatient Prescriptions  Medication Sig  Dispense Refill  . anastrozole (ARIMIDEX) 1 MG tablet TAKE ONE TABLET BY MOUTH ONCE DAILY 90 tablet 3  . aspirin 81 MG tablet Take 81 mg by mouth daily.    . Cholecalciferol (VITAMIN D) 2000 UNITS CAPS Take 1 capsule by mouth daily.    Marland Kitchen losartan (COZAAR) 25 MG tablet Take 25 mg by mouth daily.    . Multiple Vitamins-Minerals  (MULTIVITAMIN WITH MINERALS) tablet Take 1 tablet by mouth daily.    . Red Yeast Rice Extract (RED YEAST RICE PO) Take 2 capsules by mouth daily.     No current facility-administered medications for this visit.     PHYSICAL EXAMINATION: ECOG PERFORMANCE STATUS: 1 - Symptomatic but completely ambulatory  Vitals:   04/08/16 0815  BP: (!) 152/61  Pulse: 78  Resp: 19  Temp: 98.2 F (36.8 C)   Filed Weights   04/08/16 0815  Weight: 243 lb 3.2 oz (110.3 kg)    GENERAL:alert, no distress and comfortable SKIN: skin color, texture, turgor are normal, no rashes or significant lesions EYES: normal, Conjunctiva are pink and non-injected, sclera clear OROPHARYNX:no exudate, no erythema and lips, buccal mucosa, and tongue normal  NECK: supple, thyroid normal size, non-tender, without nodularity LYMPH:  no palpable lymphadenopathy in the cervical, axillary or inguinal LUNGS: clear to auscultation and percussion with normal breathing effort HEART: regular rate & rhythm and no murmurs and no lower extremity edema ABDOMEN:abdomen soft, non-tender and normal bowel sounds MUSCULOSKELETAL:no cyanosis of digits and no clubbing  NEURO: alert & oriented x 3 with fluent speech, no focal motor/sensory deficits EXTREMITIES: No lower extremity edema BREAST: No palpable masses or nodules in either right or left breasts. No palpable axillary supraclavicular or infraclavicular adenopathy no breast tenderness or nipple discharge. (exam performed in the presence of a chaperone)  LABORATORY DATA:  I have reviewed the data as listed   Chemistry      Component Value Date/Time   NA 144 10/03/2014 0914   K 4.6 10/03/2014 0914   CL 100 01/04/2013 0938   CO2 29 10/03/2014 0914   BUN 17.8 10/03/2014 0914   CREATININE 0.8 10/03/2014 0914      Component Value Date/Time   CALCIUM 9.7 10/03/2014 0914   ALKPHOS 74 10/03/2014 0914   AST 18 10/03/2014 0914   ALT 13 10/03/2014 0914   BILITOT 0.98 10/03/2014 0914        Lab Results  Component Value Date   WBC 8.1 10/03/2014   HGB 13.0 10/03/2014   HCT 38.1 10/03/2014   MCV 93.9 10/03/2014   PLT 310 10/03/2014   NEUTROABS 5.4 10/03/2014    ASSESSMENT & PLAN:  Breast cancer of upper-outer quadrant of left female breast Left breast DCIS status post lumpectomy 07/01/2014 ER 100%, PR 96% Right breast fibroadenoma 1.2 cm previously biopsied in 2009 Status post adjuvant radiation therapy completed April 2016, started anastrozole 1 mg daily 09/14/2014  Anastrozole toxicities: No major side effects of anastrozole. She denies any hot flashes or muscle aches or any other problems.  Breast Cancer Surveillance: 1. Breast exam reveals a palpable nodularity at the site of the lumpectomy which is the same as before. Rest of the breast and axillary exam is normal. 2. Mammogram 05/15/2015: Benign, breast density category B 3. Bone density T score +1.1 normal  Return to clinic in July 2018 after that we can see her annually   No orders of the defined types were placed in this encounter.  The patient has a good  understanding of the overall plan. she agrees with it. she will call with any problems that may develop before the next visit here.   Rulon Eisenmenger, MD 04/08/16

## 2016-05-04 ENCOUNTER — Other Ambulatory Visit: Payer: Self-pay | Admitting: Hematology and Oncology

## 2016-05-04 DIAGNOSIS — Z853 Personal history of malignant neoplasm of breast: Secondary | ICD-10-CM

## 2016-05-11 DIAGNOSIS — Z86010 Personal history of colonic polyps: Secondary | ICD-10-CM | POA: Diagnosis not present

## 2016-05-11 DIAGNOSIS — Z800 Family history of malignant neoplasm of digestive organs: Secondary | ICD-10-CM | POA: Diagnosis not present

## 2016-05-14 DIAGNOSIS — H81399 Other peripheral vertigo, unspecified ear: Secondary | ICD-10-CM | POA: Diagnosis not present

## 2016-05-17 ENCOUNTER — Ambulatory Visit
Admission: RE | Admit: 2016-05-17 | Discharge: 2016-05-17 | Disposition: A | Discharge status: Home / Self Care | Payer: Medicare Other | Source: Ambulatory Visit | Attending: Hematology and Oncology | Admitting: Hematology and Oncology

## 2016-05-17 ENCOUNTER — Other Ambulatory Visit: Payer: Self-pay | Admitting: Hematology and Oncology

## 2016-05-17 DIAGNOSIS — R921 Mammographic calcification found on diagnostic imaging of breast: Secondary | ICD-10-CM | POA: Diagnosis not present

## 2016-05-17 DIAGNOSIS — Z853 Personal history of malignant neoplasm of breast: Secondary | ICD-10-CM

## 2016-05-20 ENCOUNTER — Ambulatory Visit
Admission: RE | Admit: 2016-05-20 | Discharge: 2016-05-20 | Disposition: A | Discharge status: Home / Self Care | Payer: Medicare Other | Source: Ambulatory Visit | Attending: Hematology and Oncology | Admitting: Hematology and Oncology

## 2016-05-20 DIAGNOSIS — N390 Urinary tract infection, site not specified: Secondary | ICD-10-CM | POA: Diagnosis not present

## 2016-05-20 DIAGNOSIS — R921 Mammographic calcification found on diagnostic imaging of breast: Secondary | ICD-10-CM

## 2016-05-20 DIAGNOSIS — R350 Frequency of micturition: Secondary | ICD-10-CM | POA: Diagnosis not present

## 2016-05-20 DIAGNOSIS — D242 Benign neoplasm of left breast: Secondary | ICD-10-CM | POA: Diagnosis not present

## 2016-05-27 DIAGNOSIS — Z86010 Personal history of colonic polyps: Secondary | ICD-10-CM | POA: Diagnosis not present

## 2016-05-27 DIAGNOSIS — E785 Hyperlipidemia, unspecified: Secondary | ICD-10-CM | POA: Diagnosis not present

## 2016-05-27 DIAGNOSIS — Z853 Personal history of malignant neoplasm of breast: Secondary | ICD-10-CM | POA: Diagnosis not present

## 2016-05-27 DIAGNOSIS — E119 Type 2 diabetes mellitus without complications: Secondary | ICD-10-CM | POA: Diagnosis not present

## 2016-05-27 DIAGNOSIS — J309 Allergic rhinitis, unspecified: Secondary | ICD-10-CM | POA: Diagnosis not present

## 2016-05-27 DIAGNOSIS — Z0000 Encounter for general adult medical examination without abnormal findings: Secondary | ICD-10-CM | POA: Diagnosis not present

## 2016-05-27 DIAGNOSIS — I10 Essential (primary) hypertension: Secondary | ICD-10-CM | POA: Diagnosis not present

## 2016-05-27 DIAGNOSIS — E559 Vitamin D deficiency, unspecified: Secondary | ICD-10-CM | POA: Diagnosis not present

## 2016-05-27 DIAGNOSIS — R7301 Impaired fasting glucose: Secondary | ICD-10-CM | POA: Diagnosis not present

## 2016-05-31 ENCOUNTER — Other Ambulatory Visit: Payer: Self-pay | Admitting: Hematology and Oncology

## 2016-05-31 DIAGNOSIS — C50412 Malignant neoplasm of upper-outer quadrant of left female breast: Secondary | ICD-10-CM

## 2016-06-01 ENCOUNTER — Other Ambulatory Visit: Payer: Self-pay | Admitting: Emergency Medicine

## 2016-06-01 DIAGNOSIS — C50412 Malignant neoplasm of upper-outer quadrant of left female breast: Secondary | ICD-10-CM

## 2016-06-01 MED ORDER — ANASTROZOLE 1 MG PO TABS: 1.0000 mg | ORAL_TABLET | Freq: Every day | ORAL | 3 refills | Status: DC

## 2016-06-08 DIAGNOSIS — Z01818 Encounter for other preprocedural examination: Secondary | ICD-10-CM | POA: Diagnosis not present

## 2016-06-08 DIAGNOSIS — Z86010 Personal history of colonic polyps: Secondary | ICD-10-CM | POA: Diagnosis not present

## 2016-06-28 DIAGNOSIS — H524 Presbyopia: Secondary | ICD-10-CM | POA: Diagnosis not present

## 2016-09-22 ENCOUNTER — Telehealth: Payer: Self-pay

## 2016-09-22 NOTE — Telephone Encounter (Signed)
Spoke with patient and she is aware of her new appt as dr is out of office

## 2016-10-31 ENCOUNTER — Ambulatory Visit (HOSPITAL_BASED_OUTPATIENT_CLINIC_OR_DEPARTMENT_OTHER): Payer: Medicare Other | Admitting: Hematology and Oncology

## 2016-10-31 ENCOUNTER — Encounter: Payer: Self-pay | Admitting: Hematology and Oncology

## 2016-10-31 DIAGNOSIS — C50412 Malignant neoplasm of upper-outer quadrant of left female breast: Secondary | ICD-10-CM

## 2016-10-31 DIAGNOSIS — Z170 Estrogen receptor positive status [ER+]: Secondary | ICD-10-CM | POA: Diagnosis not present

## 2016-10-31 MED ORDER — ANASTROZOLE 1 MG PO TABS: 1.0000 mg | ORAL_TABLET | Freq: Every day | ORAL | 3 refills | Status: DC

## 2016-10-31 NOTE — Assessment & Plan Note (Signed)
Left breast DCIS status post lumpectomy 07/01/2014 ER 100%, PR 96% Right breast fibroadenoma 1.2 cm previously biopsied in 2009 Status post adjuvant radiation therapy completed April 2016, started anastrozole 1 mg daily 09/14/2014  Anastrozole toxicities: No major side effects of anastrozole. She denies any hot flashes or muscle aches or any other problems.  Breast Cancer Surveillance: 1. Breast exam reveals a palpable nodularity at the site of the lumpectomy which is the same as before.  2. Mammogram 05/17/2016: Benign, breast density category B 3. Bone density 06/09/2015 T score +1.1 normal  Return to clinic in 1 year for follow-up

## 2016-10-31 NOTE — Progress Notes (Signed)
Patient Care Team: Hulan Fess, MD as PCP - General (Family Medicine) Excell Seltzer, MD as Consulting Physician (General Surgery) Rolm Bookbinder, MD as Consulting Physician (General Surgery) Thea Silversmith, MD (Inactive) as Consulting Physician (Radiation Oncology) Rockwell Germany, RN as Registered Nurse Mauro Kaufmann, RN as Registered Nurse Holley Bouche, NP as Nurse Practitioner (Nurse Practitioner)  DIAGNOSIS:  Encounter Diagnosis  Name Primary?  . Malignant neoplasm of upper-outer quadrant of left breast in female, estrogen receptor positive ([place])     SUMMARY OF ONCOLOGIC HISTORY:   Breast cancer of upper-outer quadrant of left female breast ([place])   05/29/2014 Initial Biopsy    Left breast needle core biopsy: Grade 2, DCIS with calcs. ER+ (100%), PR+ (96%).       06/04/2014 Initial Diagnosis    Left breast DCIS with calcifications, ER 100%, PR 96%      06/10/2014 Breast MRI    Left breast: 2.4 x 1.3 x 1.1 cm area of patchy non-mass enhancement upper outer quadrant includes postbiopsy seroma; Right breast: 1.2 cm previously biopsied stable benign fibroadenoma      06/12/2014 Procedure    Genetic counseling/testing: Identified 1 VUS on CHEK2 gene. Remainder of 17 gene panel tested negative and included: ATM, BARD1, BRCA 1/2, BRIP1, CDH1, CHEK2, EPCAM, MLH1, MSH2, MSH6, NBN, NF1, PALB2, PTEN, RAD50, RAD51C, RAD51D, STK11, and TP53.       07/01/2014 Surgery    Left breast lumpectomy ([facility]): Grade 1, DCIS, spanning 2.3 cm, 1 mm margin, ER 100%, PR 96%      07/31/2014 - 08/28/2014 Radiation Therapy    Adjuvant RT completed Pablo Ledger). Left breast: Total dose 42.5 Gy over 17 fractions. Left breast boost: Total dose 7.5 Gy over 3 fractions.       09/14/2014 -  Anti-estrogen oral therapy    Anastrazole '1mg'$  daily. Planned duration of treatment: 5 years Lindi Adie)      09/25/2014 Survivorship    Survivorship Care Plan given to patient and reviewed with her in  person.        CHIEF COMPLIANT: Follow-up on anastrozole therapy  INTERVAL HISTORY: Kim Castro is a 71 year old with above-mentioned history of left breast cancer currently on anastrozole. She is tolerating it fairly well. She's been on it for the past 2 years. She denies any hot flashes or myalgias. She denies any pain lumps or nodules in the breasts.  REVIEW OF SYSTEMS:   Constitutional: Denies fevers, chills or abnormal weight loss Eyes: Denies blurriness of vision Ears, nose, mouth, throat, and face: Denies mucositis or sore throat Respiratory: Denies cough, dyspnea or wheezes Cardiovascular: Denies palpitation, chest discomfort Gastrointestinal:  Denies nausea, heartburn or change in bowel habits Skin: Denies abnormal skin rashes Lymphatics: Denies new lymphadenopathy or easy bruising Neurological:Denies numbness, tingling or new weaknesses Behavioral/Psych: Mood is stable, no new changes  Extremities: No lower extremity edema Breast:  denies any pain or lumps or nodules in either breasts All other systems were reviewed with the patient and are negative.  I have reviewed the past medical history, past surgical history, social history and family history with the patient and they are unchanged from previous note.  ALLERGIES:  is allergic to oxycodone.  MEDICATIONS:  Current Outpatient Prescriptions  Medication Sig Dispense Refill  . anastrozole (ARIMIDEX) 1 MG tablet Take 1 tablet (1 mg total) by mouth daily. 90 tablet 3  . aspirin 81 MG tablet Take 81 mg by mouth daily.    . Cholecalciferol (VITAMIN D) 2000 UNITS  CAPS Take 1 capsule by mouth daily.    Marland Kitchen losartan (COZAAR) 25 MG tablet Take 25 mg by mouth daily.    . Multiple Vitamins-Minerals (MULTIVITAMIN WITH MINERALS) tablet Take 1 tablet by mouth daily.    . Red Yeast Rice Extract (RED YEAST RICE PO) Take 2 capsules by mouth daily.     No current facility-administered medications for this visit.     PHYSICAL  EXAMINATION: ECOG PERFORMANCE STATUS: 0 - Asymptomatic  Vitals:   10/31/16 1020  BP: 130/62  Pulse: 70  Resp: 18  Temp: 98.5 F (36.9 C)   Filed Weights   10/31/16 1020  Weight: 250 lb 3.2 oz (113.5 kg)    GENERAL:alert, no distress and comfortable SKIN: skin color, texture, turgor are normal, no rashes or significant lesions EYES: normal, Conjunctiva are pink and non-injected, sclera clear OROPHARYNX:no exudate, no erythema and lips, buccal mucosa, and tongue normal  NECK: supple, thyroid normal size, non-tender, without nodularity LYMPH:  no palpable lymphadenopathy in the cervical, axillary or inguinal LUNGS: clear to auscultation and percussion with normal breathing effort HEART: regular rate & rhythm and no murmurs and no lower extremity edema ABDOMEN:abdomen soft, non-tender and normal bowel sounds MUSCULOSKELETAL:no cyanosis of digits and no clubbing  NEURO: alert & oriented x 3 with fluent speech, no focal motor/sensory deficits EXTREMITIES: No lower extremity edema BREAST: No palpable masses or nodules in either right or left breasts. No palpable axillary supraclavicular or infraclavicular adenopathy no breast tenderness or nipple discharge. (exam performed in the presence of a chaperone)  LABORATORY DATA:  I have reviewed the data as listed   Chemistry      Component Value Date/Time   NA 144 10/03/2014 0914   K 4.6 10/03/2014 0914   CL 100 01/04/2013 0938   CO2 29 10/03/2014 0914   BUN 17.8 10/03/2014 0914   CREATININE 0.8 10/03/2014 0914      Component Value Date/Time   CALCIUM 9.7 10/03/2014 0914   ALKPHOS 74 10/03/2014 0914   AST 18 10/03/2014 0914   ALT 13 10/03/2014 0914   BILITOT 0.98 10/03/2014 0914       Lab Results  Component Value Date   WBC 8.1 10/03/2014   HGB 13.0 10/03/2014   HCT 38.1 10/03/2014   MCV 93.9 10/03/2014   PLT 310 10/03/2014   NEUTROABS 5.4 10/03/2014    ASSESSMENT & PLAN:  Breast cancer of upper-outer quadrant of  left female breast Left breast DCIS status post lumpectomy 07/01/2014 ER 100%, PR 96% Right breast fibroadenoma 1.2 cm previously biopsied in 2009 Status post adjuvant radiation therapy completed April 2016, started anastrozole 1 mg daily 09/14/2014  Anastrozole toxicities: No major side effects of anastrozole. She denies any hot flashes or muscle aches or any other problems.  Breast Cancer Surveillance: 1. Breast exam reveals a palpable nodularity at the site of the lumpectomy which is the same as before.  2. Mammogram 05/17/2016: Benign, breast density category B 3. Bone density 06/09/2015 T score +1.1 normal  Return to clinic in 1 year for follow-up   I spent 25 minutes talking to the patient of which more than half was spent in counseling and coordination of care.  No orders of the defined types were placed in this encounter.  The patient has a good understanding of the overall plan. she agrees with it. she will call with any problems that may develop before the next visit here.   Rulon Eisenmenger, MD 10/31/16

## 2016-11-04 ENCOUNTER — Ambulatory Visit: Payer: Medicare Other | Admitting: Hematology and Oncology

## 2017-01-19 DIAGNOSIS — R300 Dysuria: Secondary | ICD-10-CM | POA: Diagnosis not present

## 2017-01-19 DIAGNOSIS — Z6841 Body Mass Index (BMI) 40.0 and over, adult: Secondary | ICD-10-CM | POA: Diagnosis not present

## 2017-02-15 DIAGNOSIS — R300 Dysuria: Secondary | ICD-10-CM | POA: Diagnosis not present

## 2017-05-03 ENCOUNTER — Other Ambulatory Visit: Payer: Self-pay | Admitting: Hematology and Oncology

## 2017-05-03 DIAGNOSIS — Z853 Personal history of malignant neoplasm of breast: Secondary | ICD-10-CM

## 2017-06-14 DIAGNOSIS — Z23 Encounter for immunization: Secondary | ICD-10-CM | POA: Diagnosis not present

## 2017-06-14 DIAGNOSIS — Z853 Personal history of malignant neoplasm of breast: Secondary | ICD-10-CM | POA: Diagnosis not present

## 2017-06-14 DIAGNOSIS — Z0000 Encounter for general adult medical examination without abnormal findings: Secondary | ICD-10-CM | POA: Diagnosis not present

## 2017-06-14 DIAGNOSIS — I10 Essential (primary) hypertension: Secondary | ICD-10-CM | POA: Diagnosis not present

## 2017-06-28 ENCOUNTER — Ambulatory Visit
Admission: RE | Admit: 2017-06-28 | Discharge: 2017-06-28 | Disposition: A | Discharge status: Home / Self Care | Payer: Medicare Other | Source: Ambulatory Visit | Attending: Hematology and Oncology | Admitting: Hematology and Oncology

## 2017-06-28 DIAGNOSIS — R928 Other abnormal and inconclusive findings on diagnostic imaging of breast: Secondary | ICD-10-CM | POA: Diagnosis not present

## 2017-06-28 DIAGNOSIS — Z853 Personal history of malignant neoplasm of breast: Secondary | ICD-10-CM

## 2017-06-28 HISTORY — DX: Personal history of irradiation: Z92.3

## 2017-10-31 ENCOUNTER — Telehealth: Payer: Self-pay | Admitting: Hematology and Oncology

## 2017-10-31 ENCOUNTER — Inpatient Hospital Stay: Payer: Medicare Other | Attending: Hematology and Oncology | Admitting: Hematology and Oncology

## 2017-10-31 DIAGNOSIS — Z7982 Long term (current) use of aspirin: Secondary | ICD-10-CM | POA: Insufficient documentation

## 2017-10-31 DIAGNOSIS — Z79811 Long term (current) use of aromatase inhibitors: Secondary | ICD-10-CM | POA: Insufficient documentation

## 2017-10-31 DIAGNOSIS — Z79899 Other long term (current) drug therapy: Secondary | ICD-10-CM | POA: Insufficient documentation

## 2017-10-31 DIAGNOSIS — Z923 Personal history of irradiation: Secondary | ICD-10-CM | POA: Insufficient documentation

## 2017-10-31 DIAGNOSIS — Z170 Estrogen receptor positive status [ER+]: Secondary | ICD-10-CM | POA: Insufficient documentation

## 2017-10-31 DIAGNOSIS — C50412 Malignant neoplasm of upper-outer quadrant of left female breast: Secondary | ICD-10-CM

## 2017-10-31 DIAGNOSIS — D0512 Intraductal carcinoma in situ of left breast: Secondary | ICD-10-CM | POA: Diagnosis present

## 2017-10-31 MED ORDER — ANASTROZOLE 1 MG PO TABS: 1.0000 mg | ORAL_TABLET | Freq: Every day | ORAL | 3 refills | Status: DC

## 2017-10-31 NOTE — Telephone Encounter (Signed)
Gave patient avs and calendar of upcoming July 2020 appointments.  °

## 2017-10-31 NOTE — Assessment & Plan Note (Signed)
Left breast DCIS status post lumpectomy 07/01/2014 ER 100%, PR 96% Right breast fibroadenoma 1.2 cm previously biopsied in 2009 Status post adjuvant radiation therapy completed April 2016, started anastrozole 1 mg daily 09/14/2014  Anastrozole toxicities: No major side effects of anastrozole. She denies any hot flashes or muscle aches or any other problems.  Breast Cancer Surveillance: 1. Breast exam reveals a palpable nodularity at the site of the lumpectomy which is the same as before.  2. Mammogram 06/28/2017: 8 mm group of benign-appearing calcifications same as before, breast density category B 3. Bone density 06/09/2015 T score +1.1 normal  Return to clinic in 1 year for follow-up

## 2017-10-31 NOTE — Progress Notes (Signed)
Patient Care Team: Hulan Fess, MD as PCP - General (Family Medicine) Excell Seltzer, MD as Consulting Physician (General Surgery) Rolm Bookbinder, MD as Consulting Physician (General Surgery) Thea Silversmith, MD as Consulting Physician (Radiation Oncology) Rockwell Germany, RN as Registered Nurse Mauro Kaufmann, RN as Registered Nurse Holley Bouche, NP as Nurse Practitioner (Nurse Practitioner)  DIAGNOSIS:  Encounter Diagnosis  Name Primary?  . Malignant neoplasm of upper-outer quadrant of left breast in female, estrogen receptor positive ([place])     SUMMARY OF ONCOLOGIC HISTORY:   Breast cancer of upper-outer quadrant of left female breast ([place])   05/29/2014 Initial Biopsy    Left breast needle core biopsy: Grade 2, DCIS with calcs. ER+ (100%), PR+ (96%).       06/04/2014 Initial Diagnosis    Left breast DCIS with calcifications, ER 100%, PR 96%      06/10/2014 Breast MRI    Left breast: 2.4 x 1.3 x 1.1 cm area of patchy non-mass enhancement upper outer quadrant includes postbiopsy seroma; Right breast: 1.2 cm previously biopsied stable benign fibroadenoma      06/12/2014 Procedure    Genetic counseling/testing: Identified 1 VUS on CHEK2 gene. Remainder of 17 gene panel tested negative and included: ATM, BARD1, BRCA 1/2, BRIP1, CDH1, CHEK2, EPCAM, MLH1, MSH2, MSH6, NBN, NF1, PALB2, PTEN, RAD50, RAD51C, RAD51D, STK11, and TP53.       07/01/2014 Surgery    Left breast lumpectomy ([facility]): Grade 1, DCIS, spanning 2.3 cm, 1 mm margin, ER 100%, PR 96%      07/31/2014 - 08/28/2014 Radiation Therapy    Adjuvant RT completed Pablo Ledger). Left breast: Total dose 42.5 Gy over 17 fractions. Left breast boost: Total dose 7.5 Gy over 3 fractions.       09/14/2014 -  Anti-estrogen oral therapy    Anastrazole '1mg'$  daily. Planned duration of treatment: 5 years Lindi Adie)      09/25/2014 Survivorship    Survivorship Care Plan given to patient and reviewed with her in person.          CHIEF COMPLIANT: Follow-up on anastrozole therapy  INTERVAL HISTORY: Kim Castro is a 72 year old with above-mentioned history of left breast DCIS who was treated with lumpectomy followed by radiation and is currently on anastrozole therapy since 2016.  She has been on it for the past 3 years.  She appears to be tolerating anastrozole extremely well.  She denies any lumps or nodules in the breast.  REVIEW OF SYSTEMS:   Constitutional: Denies fevers, chills or abnormal weight loss Eyes: Denies blurriness of vision Ears, nose, mouth, throat, and face: Denies mucositis or sore throat Respiratory: Denies cough, dyspnea or wheezes Cardiovascular: Denies palpitation, chest discomfort Gastrointestinal:  Denies nausea, heartburn or change in bowel habits Skin: Denies abnormal skin rashes Lymphatics: Denies new lymphadenopathy or easy bruising Neurological:Denies numbness, tingling or new weaknesses Behavioral/Psych: Mood is stable, no new changes  Extremities: No lower extremity edema Breast:  denies any pain or lumps or nodules in either breasts All other systems were reviewed with the patient and are negative.  I have reviewed the past medical history, past surgical history, social history and family history with the patient and they are unchanged from previous note.  ALLERGIES:  is allergic to oxycodone.  MEDICATIONS:  Current Outpatient Medications  Medication Sig Dispense Refill  . anastrozole (ARIMIDEX) 1 MG tablet Take 1 tablet (1 mg total) by mouth daily. 90 tablet 3  . aspirin 81 MG tablet Take 81 mg by  mouth daily.    . Cholecalciferol (VITAMIN D) 2000 UNITS CAPS Take 1 capsule by mouth daily.    Marland Kitchen losartan (COZAAR) 25 MG tablet Take 25 mg by mouth daily.    . Multiple Vitamins-Minerals (MULTIVITAMIN WITH MINERALS) tablet Take 1 tablet by mouth daily.    . Red Yeast Rice Extract (RED YEAST RICE PO) Take 2 capsules by mouth daily.     No current facility-administered  medications for this visit.     PHYSICAL EXAMINATION: ECOG PERFORMANCE STATUS: 1 - Symptomatic but completely ambulatory  Vitals:   10/31/17 1007  BP: (!) 150/66  Pulse: 86  Resp: 17  Temp: 99.2 F (37.3 C)  SpO2: 94%   Filed Weights   10/31/17 1007  Weight: 255 lb 1.6 oz (115.7 kg)    GENERAL:alert, no distress and comfortable SKIN: skin color, texture, turgor are normal, no rashes or significant lesions EYES: normal, Conjunctiva are pink and non-injected, sclera clear OROPHARYNX:no exudate, no erythema and lips, buccal mucosa, and tongue normal  NECK: supple, thyroid normal size, non-tender, without nodularity LYMPH:  no palpable lymphadenopathy in the cervical, axillary or inguinal LUNGS: clear to auscultation and percussion with normal breathing effort HEART: regular rate & rhythm and no murmurs and no lower extremity edema ABDOMEN:abdomen soft, non-tender and normal bowel sounds MUSCULOSKELETAL:no cyanosis of digits and no clubbing  NEURO: alert & oriented x 3 with fluent speech, no focal motor/sensory deficits EXTREMITIES: No lower extremity edema BREAST: No palpable masses or nodules in either right or left breasts. No palpable axillary supraclavicular or infraclavicular adenopathy no breast tenderness or nipple discharge. (exam performed in the presence of a chaperone)  LABORATORY DATA:  I have reviewed the data as listed CMP Latest Ref Rng & Units 10/03/2014 06/11/2014 01/04/2013  Glucose 70 - 140 mg/dl 133 148(H) 142(H)  BUN 7.0 - 26.0 mg/dL 17.8 17.7 18  Creatinine 0.6 - 1.1 mg/dL 0.8 0.8 0.62  Sodium 136 - 145 mEq/L 144 142 141  Potassium 3.5 - 5.1 mEq/L 4.6 4.0 3.7  Chloride 96 - 112 mEq/L - - 100  CO2 22 - 29 mEq/L '29 29 29  '$ Calcium 8.4 - 10.4 mg/dL 9.7 9.9 10.2  Total Protein 6.4 - 8.3 g/dL 7.0 7.5 -  Total Bilirubin 0.20 - 1.20 mg/dL 0.98 0.86 -  Alkaline Phos 40 - 150 U/L 74 83 -  AST 5 - 34 U/L 18 32 -  ALT 0 - 55 U/L 13 33 -    Lab Results  Component  Value Date   WBC 8.1 10/03/2014   HGB 13.0 10/03/2014   HCT 38.1 10/03/2014   MCV 93.9 10/03/2014   PLT 310 10/03/2014   NEUTROABS 5.4 10/03/2014    ASSESSMENT & PLAN:  Breast cancer of upper-outer quadrant of left female breast Left breast DCIS status post lumpectomy 07/01/2014 ER 100%, PR 96% Right breast fibroadenoma 1.2 cm previously biopsied in 2009 Status post adjuvant radiation therapy completed April 2016, started anastrozole 1 mg daily 09/14/2014  Anastrozole toxicities: No major side effects of anastrozole. She denies any hot flashes or muscle aches or any other problems.  Breast Cancer Surveillance: 1. Breast exam reveals a palpable nodularity at the site of the lumpectomy which is the same as before.  2. Mammogram 06/28/2017: 8 mm group of benign-appearing calcifications same as before, breast density category B 3. Bone density 06/09/2015 T score +1.1 normal  Return to clinic in 1 year for follow-up    No orders of the defined  types were placed in this encounter.  The patient has a good understanding of the overall plan. she agrees with it. she will call with any problems that may develop before the next visit here.   Harriette Ohara, MD 10/31/17

## 2017-11-01 DIAGNOSIS — H5211 Myopia, right eye: Secondary | ICD-10-CM | POA: Diagnosis not present

## 2017-12-04 ENCOUNTER — Other Ambulatory Visit: Payer: Self-pay | Admitting: Hematology and Oncology

## 2017-12-04 DIAGNOSIS — R921 Mammographic calcification found on diagnostic imaging of breast: Secondary | ICD-10-CM

## 2017-12-06 ENCOUNTER — Other Ambulatory Visit: Payer: Self-pay

## 2017-12-06 ENCOUNTER — Other Ambulatory Visit: Payer: Self-pay | Admitting: Hematology and Oncology

## 2017-12-06 DIAGNOSIS — R921 Mammographic calcification found on diagnostic imaging of breast: Secondary | ICD-10-CM

## 2017-12-08 ENCOUNTER — Ambulatory Visit
Admission: RE | Admit: 2017-12-08 | Discharge: 2017-12-08 | Disposition: A | Discharge status: Home / Self Care | Payer: Medicare Other | Source: Ambulatory Visit | Attending: Hematology and Oncology | Admitting: Hematology and Oncology

## 2017-12-08 ENCOUNTER — Other Ambulatory Visit: Payer: Self-pay | Admitting: Hematology and Oncology

## 2017-12-08 DIAGNOSIS — R921 Mammographic calcification found on diagnostic imaging of breast: Secondary | ICD-10-CM

## 2017-12-08 HISTORY — DX: Malignant neoplasm of unspecified site of unspecified female breast: C50.919

## 2017-12-11 ENCOUNTER — Ambulatory Visit
Admission: RE | Admit: 2017-12-11 | Discharge: 2017-12-11 | Disposition: A | Discharge status: Home / Self Care | Payer: Medicare Other | Source: Ambulatory Visit | Attending: Hematology and Oncology | Admitting: Hematology and Oncology

## 2017-12-11 ENCOUNTER — Other Ambulatory Visit: Payer: Self-pay | Admitting: Hematology and Oncology

## 2017-12-11 DIAGNOSIS — R921 Mammographic calcification found on diagnostic imaging of breast: Secondary | ICD-10-CM | POA: Diagnosis not present

## 2017-12-11 DIAGNOSIS — N6489 Other specified disorders of breast: Secondary | ICD-10-CM | POA: Diagnosis not present

## 2018-07-11 ENCOUNTER — Other Ambulatory Visit: Payer: Self-pay | Admitting: Hematology and Oncology

## 2018-07-11 DIAGNOSIS — I10 Essential (primary) hypertension: Secondary | ICD-10-CM | POA: Diagnosis not present

## 2018-07-11 DIAGNOSIS — E785 Hyperlipidemia, unspecified: Secondary | ICD-10-CM | POA: Diagnosis not present

## 2018-07-11 DIAGNOSIS — Z9889 Other specified postprocedural states: Secondary | ICD-10-CM

## 2018-07-11 DIAGNOSIS — Z0000 Encounter for general adult medical examination without abnormal findings: Secondary | ICD-10-CM | POA: Diagnosis not present

## 2018-07-11 DIAGNOSIS — Z853 Personal history of malignant neoplasm of breast: Secondary | ICD-10-CM | POA: Diagnosis not present

## 2018-07-24 ENCOUNTER — Ambulatory Visit
Admission: RE | Admit: 2018-07-24 | Discharge: 2018-07-24 | Disposition: A | Discharge status: Home / Self Care | Payer: Medicare Other | Source: Ambulatory Visit | Attending: Hematology and Oncology | Admitting: Hematology and Oncology

## 2018-07-24 ENCOUNTER — Other Ambulatory Visit: Payer: Self-pay

## 2018-07-24 DIAGNOSIS — Z9889 Other specified postprocedural states: Secondary | ICD-10-CM

## 2018-09-12 DIAGNOSIS — L570 Actinic keratosis: Secondary | ICD-10-CM | POA: Diagnosis not present

## 2018-09-12 DIAGNOSIS — X32XXXA Exposure to sunlight, initial encounter: Secondary | ICD-10-CM | POA: Diagnosis not present

## 2018-09-12 DIAGNOSIS — C44612 Basal cell carcinoma of skin of right upper limb, including shoulder: Secondary | ICD-10-CM | POA: Diagnosis not present

## 2018-10-10 DIAGNOSIS — Z85828 Personal history of other malignant neoplasm of skin: Secondary | ICD-10-CM | POA: Diagnosis not present

## 2018-10-10 DIAGNOSIS — L255 Unspecified contact dermatitis due to plants, except food: Secondary | ICD-10-CM | POA: Diagnosis not present

## 2018-10-10 DIAGNOSIS — Z08 Encounter for follow-up examination after completed treatment for malignant neoplasm: Secondary | ICD-10-CM | POA: Diagnosis not present

## 2018-10-30 NOTE — Assessment & Plan Note (Signed)
Left breast DCIS status post lumpectomy 07/01/2014 ER 100%, PR 96% Right breast fibroadenoma 1.2 cm previously biopsied in 2009 Status post adjuvant radiation therapy completed April 2016, started anastrozole 1 mg daily 09/14/2014  Anastrozole toxicities: No major side effects of anastrozole. She denies any hot flashes or muscle aches or any other problems.  Breast Cancer Surveillance: 1. Breast exam reveals a palpable nodularity at the site of the lumpectomy which is the same as before.  2. Mammogram  07/24/2018:  No evidence of malignancy breast density category B 3. Bone density 06/09/2015 T score +1.1 normal  Return to clinic in 1 year for follow-up

## 2018-11-05 NOTE — Progress Notes (Signed)
Patient Care Team: Hulan Fess, MD as PCP - General (Family Medicine) Excell Seltzer, MD as Consulting Physician (General Surgery) Rolm Bookbinder, MD as Consulting Physician (General Surgery) Thea Silversmith, MD as Consulting Physician (Radiation Oncology) Rockwell Germany, RN as Registered Nurse Mauro Kaufmann, RN as Registered Nurse Holley Bouche, NP (Inactive) as Nurse Practitioner (Nurse Practitioner)  DIAGNOSIS:    ICD-10-CM   1. Malignant neoplasm of upper-outer quadrant of left breast in female, estrogen receptor positive ([place])  C50.412    Z17.0     SUMMARY OF ONCOLOGIC HISTORY: Oncology History  Breast cancer of upper-outer quadrant of left female breast ([place])  05/29/2014 Initial Biopsy   Left breast needle core biopsy: Grade 2, DCIS with calcs. ER+ (100%), PR+ (96%).    06/04/2014 Initial Diagnosis   Left breast DCIS with calcifications, ER 100%, PR 96%   06/10/2014 Breast MRI   Left breast: 2.4 x 1.3 x 1.1 cm area of patchy non-mass enhancement upper outer quadrant includes postbiopsy seroma; Right breast: 1.2 cm previously biopsied stable benign fibroadenoma   06/12/2014 Procedure   Genetic counseling/testing: Identified 1 VUS on CHEK2 gene. Remainder of 17 gene panel tested negative and included: ATM, BARD1, BRCA 1/2, BRIP1, CDH1, CHEK2, EPCAM, MLH1, MSH2, MSH6, NBN, NF1, PALB2, PTEN, RAD50, RAD51C, RAD51D, STK11, and TP53.    07/01/2014 Surgery   Left breast lumpectomy ([facility]): Grade 1, DCIS, spanning 2.3 cm, 1 mm margin, ER 100%, PR 96%   07/31/2014 - 08/28/2014 Radiation Therapy   Adjuvant RT completed Pablo Ledger). Left breast: Total dose 42.5 Gy over 17 fractions. Left breast boost: Total dose 7.5 Gy over 3 fractions.    09/14/2014 -  Anti-estrogen oral therapy   Anastrazole '1mg'$  daily. Planned duration of treatment: 5 years Lindi Adie)   09/25/2014 Survivorship   Survivorship Care Plan given to patient and reviewed with her in person.      CHIEF  COMPLIANT: Follow-up of DCIS on anastrozole therapy  INTERVAL HISTORY: Kim Castro is a 73 y.o. with above-mentioned history of left breast DCIS who was treated with lumpectomy followed by radiation and is currently on anastrozole therapy. I last saw her a year ago. Mammogram on 07/24/18 showed no evidence of malignancy bilaterally. She presents to the clinic today for annual follow-up.   REVIEW OF SYSTEMS:   Constitutional: Denies fevers, chills or abnormal weight loss Eyes: Denies blurriness of vision Ears, nose, mouth, throat, and face: Denies mucositis or sore throat Respiratory: Denies cough, dyspnea or wheezes Cardiovascular: Denies palpitation, chest discomfort Gastrointestinal: Denies nausea, heartburn or change in bowel habits Skin: Denies abnormal skin rashes Lymphatics: Denies new lymphadenopathy or easy bruising Neurological: Denies numbness, tingling or new weaknesses Behavioral/Psych: Mood is stable, no new changes  Extremities: No lower extremity edema Breast: denies any pain or lumps or nodules in either breasts All other systems were reviewed with the patient and are negative.  I have reviewed the past medical history, past surgical history, social history and family history with the patient and they are unchanged from previous note.  ALLERGIES:  is allergic to oxycodone.  MEDICATIONS:  Current Outpatient Medications  Medication Sig Dispense Refill  . anastrozole (ARIMIDEX) 1 MG tablet Take 1 tablet (1 mg total) by mouth daily. 90 tablet 3  . aspirin 81 MG tablet Take 81 mg by mouth daily.    . Cholecalciferol (VITAMIN D) 2000 UNITS CAPS Take 1 capsule by mouth daily.    Marland Kitchen losartan (COZAAR) 25 MG tablet Take 25 mg  by mouth daily.    . Multiple Vitamins-Minerals (MULTIVITAMIN WITH MINERALS) tablet Take 1 tablet by mouth daily.    . Red Yeast Rice Extract (RED YEAST RICE PO) Take 2 capsules by mouth daily.     No current facility-administered medications for this  visit.     PHYSICAL EXAMINATION: ECOG PERFORMANCE STATUS: 0 - Asymptomatic  Vitals:   11/06/18 0825  BP: (!) 128/52  Pulse: 80  Resp: 17  Temp: 99.1 F (37.3 C)  SpO2: 97%   Filed Weights   11/06/18 0825  Weight: 254 lb 1.6 oz (115.3 kg)    Physical exam not done due to COVID-19 precautions  LABORATORY DATA:  I have reviewed the data as listed CMP Latest Ref Rng & Units 10/03/2014 06/11/2014 01/04/2013  Glucose 70 - 140 mg/dl 133 148(H) 142(H)  BUN 7.0 - 26.0 mg/dL 17.8 17.7 18  Creatinine 0.6 - 1.1 mg/dL 0.8 0.8 0.62  Sodium 136 - 145 mEq/L 144 142 141  Potassium 3.5 - 5.1 mEq/L 4.6 4.0 3.7  Chloride 96 - 112 mEq/L - - 100  CO2 22 - 29 mEq/L '29 29 29  '$ Calcium 8.4 - 10.4 mg/dL 9.7 9.9 10.2  Total Protein 6.4 - 8.3 g/dL 7.0 7.5 -  Total Bilirubin 0.20 - 1.20 mg/dL 0.98 0.86 -  Alkaline Phos 40 - 150 U/L 74 83 -  AST 5 - 34 U/L 18 32 -  ALT 0 - 55 U/L 13 33 -    Lab Results  Component Value Date   WBC 8.1 10/03/2014   HGB 13.0 10/03/2014   HCT 38.1 10/03/2014   MCV 93.9 10/03/2014   PLT 310 10/03/2014   NEUTROABS 5.4 10/03/2014    ASSESSMENT & PLAN:  Breast cancer of upper-outer quadrant of left female breast Left breast DCIS status post lumpectomy 07/01/2014 ER 100%, PR 96% Right breast fibroadenoma 1.2 cm previously biopsied in 2009 Status post adjuvant radiation therapy completed April 2016, started anastrozole 1 mg daily 09/14/2014  Anastrozole toxicities: No major side effects of anastrozole. She denies any hot flashes or muscle aches or any other problems.  Breast Cancer Surveillance: 1. Breast exam reveals a palpable nodularity at the site of the lumpectomy which is the same as before.  2. Mammogram  07/24/2018:  No evidence of malignancy breast density category B 3. Bone density 06/09/2015 T score +1.1 normal  Return to clinic in 1 year for follow-up    No orders of the defined types were placed in this encounter.  The patient has a good  understanding of the overall plan. she agrees with it. she will call with any problems that may develop before the next visit here.  Nicholas Lose, MD 11/06/2018  Julious Oka Dorshimer am acting as scribe for Dr. Nicholas Lose.  I have reviewed the above documentation for accuracy and completeness, and I agree with the above.

## 2018-11-06 ENCOUNTER — Inpatient Hospital Stay: Payer: Medicare Other | Attending: Hematology and Oncology | Admitting: Hematology and Oncology

## 2018-11-06 ENCOUNTER — Other Ambulatory Visit: Payer: Self-pay

## 2018-11-06 DIAGNOSIS — Z79811 Long term (current) use of aromatase inhibitors: Secondary | ICD-10-CM | POA: Diagnosis not present

## 2018-11-06 DIAGNOSIS — Z7982 Long term (current) use of aspirin: Secondary | ICD-10-CM | POA: Insufficient documentation

## 2018-11-06 DIAGNOSIS — Z79899 Other long term (current) drug therapy: Secondary | ICD-10-CM | POA: Diagnosis not present

## 2018-11-06 DIAGNOSIS — Z170 Estrogen receptor positive status [ER+]: Secondary | ICD-10-CM | POA: Insufficient documentation

## 2018-11-06 DIAGNOSIS — Z923 Personal history of irradiation: Secondary | ICD-10-CM | POA: Diagnosis not present

## 2018-11-06 DIAGNOSIS — C50412 Malignant neoplasm of upper-outer quadrant of left female breast: Secondary | ICD-10-CM | POA: Insufficient documentation

## 2018-11-06 MED ORDER — PRAVASTATIN SODIUM 20 MG PO TABS: 20.0000 mg | ORAL_TABLET | Freq: Every day | ORAL | Status: DC

## 2018-11-06 MED ORDER — METFORMIN HCL 500 MG PO TABS: 500.0000 mg | ORAL_TABLET | Freq: Every day | ORAL | Status: DC

## 2018-11-06 MED ORDER — ANASTROZOLE 1 MG PO TABS: 1.0000 mg | ORAL_TABLET | Freq: Every day | ORAL | 2 refills | Status: DC

## 2018-11-07 ENCOUNTER — Telehealth: Payer: Self-pay | Admitting: Hematology and Oncology

## 2018-11-07 NOTE — Telephone Encounter (Signed)
I talk with patient regarding schedule  

## 2018-12-18 DIAGNOSIS — E1169 Type 2 diabetes mellitus with other specified complication: Secondary | ICD-10-CM | POA: Diagnosis not present

## 2018-12-18 DIAGNOSIS — Z0000 Encounter for general adult medical examination without abnormal findings: Secondary | ICD-10-CM | POA: Diagnosis not present

## 2018-12-18 DIAGNOSIS — I10 Essential (primary) hypertension: Secondary | ICD-10-CM | POA: Diagnosis not present

## 2018-12-18 DIAGNOSIS — Z853 Personal history of malignant neoplasm of breast: Secondary | ICD-10-CM | POA: Diagnosis not present

## 2019-01-29 DIAGNOSIS — L258 Unspecified contact dermatitis due to other agents: Secondary | ICD-10-CM | POA: Diagnosis not present

## 2019-06-21 ENCOUNTER — Other Ambulatory Visit: Payer: Self-pay | Admitting: Hematology and Oncology

## 2019-06-21 DIAGNOSIS — Z9889 Other specified postprocedural states: Secondary | ICD-10-CM

## 2019-07-18 DIAGNOSIS — D649 Anemia, unspecified: Secondary | ICD-10-CM | POA: Diagnosis not present

## 2019-07-18 DIAGNOSIS — Z0000 Encounter for general adult medical examination without abnormal findings: Secondary | ICD-10-CM | POA: Diagnosis not present

## 2019-07-18 DIAGNOSIS — I10 Essential (primary) hypertension: Secondary | ICD-10-CM | POA: Diagnosis not present

## 2019-07-18 DIAGNOSIS — Z853 Personal history of malignant neoplasm of breast: Secondary | ICD-10-CM | POA: Diagnosis not present

## 2019-07-18 DIAGNOSIS — E1169 Type 2 diabetes mellitus with other specified complication: Secondary | ICD-10-CM | POA: Diagnosis not present

## 2019-07-31 DIAGNOSIS — H5213 Myopia, bilateral: Secondary | ICD-10-CM | POA: Diagnosis not present

## 2019-08-26 DIAGNOSIS — Z0120 Encounter for dental examination and cleaning without abnormal findings: Secondary | ICD-10-CM | POA: Diagnosis not present

## 2019-08-27 ENCOUNTER — Other Ambulatory Visit: Payer: Self-pay

## 2019-08-27 ENCOUNTER — Ambulatory Visit
Admission: RE | Admit: 2019-08-27 | Discharge: 2019-08-27 | Disposition: A | Discharge status: Home / Self Care | Payer: Medicare Other | Source: Ambulatory Visit | Attending: Hematology and Oncology | Admitting: Hematology and Oncology

## 2019-08-27 DIAGNOSIS — R7401 Elevation of levels of liver transaminase levels: Secondary | ICD-10-CM | POA: Diagnosis not present

## 2019-08-27 DIAGNOSIS — Z853 Personal history of malignant neoplasm of breast: Secondary | ICD-10-CM | POA: Diagnosis not present

## 2019-08-27 DIAGNOSIS — E1169 Type 2 diabetes mellitus with other specified complication: Secondary | ICD-10-CM | POA: Diagnosis not present

## 2019-08-27 DIAGNOSIS — R928 Other abnormal and inconclusive findings on diagnostic imaging of breast: Secondary | ICD-10-CM | POA: Diagnosis not present

## 2019-08-27 DIAGNOSIS — Z9889 Other specified postprocedural states: Secondary | ICD-10-CM

## 2019-08-27 DIAGNOSIS — Z7984 Long term (current) use of oral hypoglycemic drugs: Secondary | ICD-10-CM | POA: Diagnosis not present

## 2019-08-31 DIAGNOSIS — C189 Malignant neoplasm of colon, unspecified: Secondary | ICD-10-CM

## 2019-08-31 HISTORY — DX: Malignant neoplasm of colon, unspecified: C18.9

## 2019-09-18 DIAGNOSIS — Z86010 Personal history of colonic polyps: Secondary | ICD-10-CM | POA: Diagnosis not present

## 2019-09-18 DIAGNOSIS — D500 Iron deficiency anemia secondary to blood loss (chronic): Secondary | ICD-10-CM | POA: Diagnosis not present

## 2019-09-18 DIAGNOSIS — Z800 Family history of malignant neoplasm of digestive organs: Secondary | ICD-10-CM | POA: Diagnosis not present

## 2019-09-24 DIAGNOSIS — Z1211 Encounter for screening for malignant neoplasm of colon: Secondary | ICD-10-CM | POA: Diagnosis not present

## 2019-09-24 DIAGNOSIS — Z86010 Personal history of colonic polyps: Secondary | ICD-10-CM | POA: Diagnosis not present

## 2019-09-24 DIAGNOSIS — Z01818 Encounter for other preprocedural examination: Secondary | ICD-10-CM | POA: Diagnosis not present

## 2019-09-24 DIAGNOSIS — E119 Type 2 diabetes mellitus without complications: Secondary | ICD-10-CM | POA: Diagnosis not present

## 2019-09-24 DIAGNOSIS — K635 Polyp of colon: Secondary | ICD-10-CM | POA: Diagnosis not present

## 2019-09-24 DIAGNOSIS — K6389 Other specified diseases of intestine: Secondary | ICD-10-CM | POA: Diagnosis not present

## 2019-09-24 HISTORY — PX: COLONOSCOPY: SHX174

## 2019-09-26 DIAGNOSIS — K635 Polyp of colon: Secondary | ICD-10-CM | POA: Diagnosis not present

## 2019-09-26 DIAGNOSIS — C180 Malignant neoplasm of cecum: Secondary | ICD-10-CM | POA: Diagnosis not present

## 2019-09-27 DIAGNOSIS — K6389 Other specified diseases of intestine: Secondary | ICD-10-CM | POA: Diagnosis not present

## 2019-09-27 DIAGNOSIS — C180 Malignant neoplasm of cecum: Secondary | ICD-10-CM | POA: Diagnosis not present

## 2019-10-02 ENCOUNTER — Telehealth: Payer: Self-pay | Admitting: *Deleted

## 2019-10-02 ENCOUNTER — Encounter: Payer: Self-pay | Admitting: *Deleted

## 2019-10-02 DIAGNOSIS — C180 Malignant neoplasm of cecum: Secondary | ICD-10-CM | POA: Diagnosis not present

## 2019-10-02 NOTE — Progress Notes (Signed)
Per MD pt to be referred to GI oncology.  RN will forward to [facility] the GI nurse navigator to schedule future apt.

## 2019-10-02 NOTE — Progress Notes (Signed)
I spoke with patient regarding referral we received from [facility] re: colon cancer.  I scheduled her with Dr. Burr Medico tomorrow to arrive by 7:45 for 8:00 am appointment.  She verbalized an understanding and very much appreciated the prompt response.

## 2019-10-02 NOTE — Telephone Encounter (Signed)
Received call from pt stating she was diagnosed with colon cancer this morning by Dr. Gerri Spore GI with [facility].  Pt requesting to be seen by Dr. Lindi Adie for further evaluation and treatment.  RN scheduled apt and placed call to [facility] for records of recent colonoscopy and testing.

## 2019-10-02 NOTE — Progress Notes (Signed)
[facility]   [phone] [phone] [place] Note   Patient Care Team: Hulan Fess, MD as PCP - General (Family Medicine) Excell Seltzer, MD (Inactive) as Consulting Physician (General Surgery) Rolm Bookbinder, MD as Consulting Physician (General Surgery) Thea Silversmith, MD as Consulting Physician (Radiation Oncology) Rockwell Germany, RN as Registered Nurse Mauro Kaufmann, RN as Registered Nurse Holley Bouche, NP (Inactive) as Nurse Practitioner (Nurse Practitioner) Truitt Merle, MD as Consulting Physician (Hematology) Jonnie Finner, RN as Oncology Nurse Navigator  Date of Service:  10/03/2019   CHIEF COMPLAINTS/PURPOSE OF CONSULTATION:  Newly diagnosed colon cancer   REFERRING PHYSICIAN:  Dr. Eber Jones  Oncology History  Breast cancer of upper-outer quadrant of left female breast ([place])  05/29/2014 Initial Biopsy   Left breast needle core biopsy: Grade 2, DCIS with calcs. ER+ (100%), PR+ (96%).    06/04/2014 Initial Diagnosis   Left breast DCIS with calcifications, ER 100%, PR 96%   06/10/2014 Breast MRI   Left breast: 2.4 x 1.3 x 1.1 cm area of patchy non-mass enhancement upper outer quadrant includes postbiopsy seroma; Right breast: 1.2 cm previously biopsied stable benign fibroadenoma   06/12/2014 Procedure   Genetic counseling/testing: Identified 1 VUS on CHEK2 gene. Remainder of 17 gene panel tested negative and included: ATM, BARD1, BRCA 1/2, BRIP1, CDH1, CHEK2, EPCAM, MLH1, MSH2, MSH6, NBN, NF1, PALB2, PTEN, RAD50, RAD51C, RAD51D, STK11, and TP53.    07/01/2014 Surgery   Left breast lumpectomy ([facility]): Grade 1, DCIS, spanning 2.3 cm, 1 mm margin, ER 100%, PR 96%   07/31/2014 - 08/28/2014 Radiation Therapy   Adjuvant RT completed Pablo Ledger). Left breast: Total dose 42.5 Gy over 17 fractions. Left breast boost: Total dose 7.5 Gy over 3 fractions.    09/14/2014 -  Anti-estrogen oral therapy   Anastrazole '1mg'$   daily. Planned duration of treatment: 5 years Lindi Adie)   09/25/2014 Survivorship   Survivorship Care Plan given to patient and reviewed with her in person.       HISTORY OF PRESENTING ILLNESS:  Kim Castro 74 y.o. female is a here because of newly diagnosed colon cancer. The patient was referred by GI Dr. Eber Jones. The patient presents to the clinic today by herself.   During her routine follow-up with her primary care physician Dr. Rex Kras in March 2021, she was found to have mild anemia and iron deficiency.  Repeat labs showed a persistent mild iron deficient anemia.  She was referred back to her gastroenterologist Dr. Eber Jones for colonoscopy.  Her previous colonoscopy was negative for polyps or tumor 9 years ago.  She underwent colonoscopy on Sep 20, 2019, which showed multiple polyps, and a large mass was friable surface at the cecum.  Biopsy of the cecal moderately differentiated adenocarcinoma.  She was referred to [place] for further evaluation.  She underwent of abdomen pelvis with contrast on Sep 27, 2019, which was negative for notable distant metastasis, and fullness in the cecum.  She feels well overall, denies any abdominal pain, bloating, nausea, vomiting, or hematochezia.  Her Metformin dose was increased lately and that she noticed loose bowel movement daily.  No change of her energy and appetite lately, no weight loss.  She functions very well at home.  She denies cough, dyspnea, or headaches.  She has chronic bilateral lower extremity, which is unchanged lately.  Review of system otherwise negative.  MEDICAL HISTORY:  Past Medical History:  Diagnosis Date  . Arthritis   . Breast  cancer ([place])   . Breast cancer of upper-outer quadrant of left female breast ([place]) 06/04/2014  . Cataract    immature on the left  . Diabetes mellitus without complication ([place])   . Diverticulosis   . Dizziness    > 51yr ago;took Antivert   . Family history of anesthesia complication    sister slow to  wake up with anesthesia  . Family history of breast cancer   . Family history of colon cancer   . Family history of uterine cancer   . GERD (gastroesophageal reflux disease)    takes occasional TUMs  . History of bronchitis    > 240yrago  . History of colon polyps   . Hypertension    takes Losartan daily and HCTZ  . Joint pain   . Numbness    to toes on each foot  . Personal history of radiation therapy   . Radiation 07/31/14-08/28/14   Left Breast 20 fxs  . Seasonal allergies    takes Claritin prn  . Urinary frequency   . Vitamin D deficiency    takes VIt D daily   SURGICAL HISTORY: Past Surgical History:  Procedure Laterality Date  . BREAST BIOPSY Bilateral   . BREAST LUMPECTOMY Left   . BREAST LUMPECTOMY WITH RADIOACTIVE SEED LOCALIZATION Left 07/01/2014   Procedure: LEFT BREAST LUMPECTOMY WITH RADIOACTIVE SEED LOCALIZATION;  Surgeon: BeExcell SeltzerMD;  [facility] Service: General;  Laterality: Left;  . CATARACT EXTRACTION Right   . COLONOSCOPY    . TOTAL KNEE ARTHROPLASTY Left 10/24/2012   Procedure: TOTAL KNEE ARTHROPLASTY;  Surgeon: FrKerin SalenMD;  [facility]: [facility]opedics;  Laterality: Left;  . TOTAL KNEE ARTHROPLASTY Right 01/09/2013   Procedure: TOTAL KNEE ARTHROPLASTY;  Surgeon: FrKerin SalenMD;  Location: [facility]: Orthopedics;  Laterality: Right;  . TUBAL LIGATION      SOCIAL HISTORY: Social History   Socioeconomic History  . Marital status: Married    Spouse name: Not on file  . Number of children: 3  . Years of education: Not on file  . Highest education level: Not on file  Occupational History  . Occupation: retired   Tobacco Use  . Smoking status: Never Smoker  . Smokeless tobacco: Never Used  Substance and Sexual Activity  . Alcohol use: No  . Drug use: No  . Sexual activity: Yes  Other Topics Concern  . Not on file  Social History Narrative  . Not on file   Social Determinants of  Health   Financial Resource Strain:   . Difficulty of Paying Living Expenses:   Food Insecurity:   . Worried About RuCharity fundraisern the Last Year:   . RaArboriculturistn the Last Year:   Transportation Needs:   . LaFilm/video editorMedical):   . Marland Kitchenack of Transportation (Non-Medical):   Physical Activity:   . Days of Exercise per Week:   . Minutes of Exercise per Session:   Stress:   . Feeling of Stress :   Social Connections:   . Frequency of Communication with Friends and Family:   . Frequency of Social Gatherings with Friends and Family:   . Attends Religious Services:   . Active Member of Clubs or Organizations:   . Attends ClArchivisteetings:   . Marland Kitchenarital Status:   Intimate Partner Violence:   . Fear of Current or Ex-Partner:   .  Emotionally Abused:   Marland Kitchen Physically Abused:   . Sexually Abused:     FAMILY HISTORY: Family History  Problem Relation Age of Onset  . Uterine cancer Mother        deceased 34  . Breast cancer Sister 35       currently 47  . Colon cancer Brother 43       currently 35  . Cancer Maternal Uncle        unk. primary; deceased 30s  . Thyroid cancer Daughter 34       currently 56; type?  . Colon cancer Daughter   . Breast cancer Sister     ALLERGIES:  is allergic to oxycodone.  MEDICATIONS:  Current Outpatient Medications  Medication Sig Dispense Refill  . omeprazole (PRILOSEC) 20 MG capsule Take 20 mg by mouth daily.    Marland Kitchen anastrozole (ARIMIDEX) 1 MG tablet Take 1 tablet (1 mg total) by mouth daily. 90 tablet 2  . aspirin 81 MG tablet Take 81 mg by mouth daily.    . Cholecalciferol (VITAMIN D) 2000 UNITS CAPS Take 1 capsule by mouth daily.    Marland Kitchen losartan (COZAAR) 25 MG tablet Take 25 mg by mouth daily.    . metFORMIN (GLUCOPHAGE) 500 MG tablet Take 1 tablet (500 mg total) by mouth daily with breakfast.    . Multiple Vitamins-Minerals (MULTIVITAMIN WITH MINERALS) tablet Take 1 tablet by mouth daily.    . pravastatin  (PRAVACHOL) 20 MG tablet Take 1 tablet (20 mg total) by mouth daily.     No current facility-administered medications for this visit.    PHYSICAL EXAMINATION: ECOG PERFORMANCE STATUS: 0 - Asymptomatic  Vitals:   10/03/19 0801  BP: (!) 166/65  Pulse: 99  Resp: 16  Temp: 97.9 F (36.6 C)  SpO2: 96%   Filed Weights   10/03/19 0801  Weight: 257 lb 12.8 oz (116.9 kg)    GENERAL:alert, no distress and comfortable SKIN: skin color, texture, turgor are normal, no rashes or significant lesions EYES: normal, Conjunctiva are pink and non-injected, sclera clear NECK: supple, thyroid normal size, non-tender, without nodularity LYMPH:  no palpable lymphadenopathy in the cervical, axillary  LUNGS: clear to auscultation and percussion with normal breathing effort HEART: regular rate & rhythm and no murmurs and no lower extremity edema ABDOMEN:abdomen soft, non-tender and normal bowel sounds Musculoskeletal:no cyanosis of digits and no clubbing  NEURO: alert & oriented x 3 with fluent speech, no focal motor/sensory deficits  LABORATORY DATA:  I have reviewed the data as listed CBC Latest Ref Rng & Units 10/03/2014 06/11/2014 01/12/2013  WBC 3.9 - 10.3 10e3/uL 8.1 9.6 9.1  Hemoglobin 11.6 - 15.9 g/dL 13.0 13.7 10.3(L)  Hematocrit 34.8 - 46.6 % 38.1 41.2 32.1(L)  Platelets 145 - 400 10e3/uL 310 353 256    CMP Latest Ref Rng & Units 10/03/2014 06/11/2014 01/04/2013  Glucose 70 - 140 mg/dl 133 148(H) 142(H)  BUN 7.0 - 26.0 mg/dL 17.8 17.7 18  Creatinine 0.6 - 1.1 mg/dL 0.8 0.8 0.62  Sodium 136 - 145 mEq/L 144 142 141  Potassium 3.5 - 5.1 mEq/L 4.6 4.0 3.7  Chloride 96 - 112 mEq/L - - 100  CO2 22 - 29 mEq/L '29 29 29  '$ Calcium 8.4 - 10.4 mg/dL 9.7 9.9 10.2  Total Protein 6.4 - 8.3 g/dL 7.0 7.5 -  Total Bilirubin 0.20 - 1.20 mg/dL 0.98 0.86 -  Alkaline Phos 40 - 150 U/L 74 83 -  AST 5 - 34 U/L  18 32 -  ALT 0 - 55 U/L 13 33 -     RADIOGRAPHIC STUDIES: I have personally reviewed the  radiological images as listed and agreed with the findings in the report. No results found.  ASSESSMENT & PLAN:  Kim Castro is a 74 y.o. Caucaisan female with a history of H/o left breast cancer, Arthritis, DM, Diverticulosis, GERD, HTN, presented with mild iron deficient anemia and colonoscopy showed a large cecal mass.  1.  Right colon cancer -I have reviewed her outside colonoscopy, and CT abdomen pelvis with contrast findings, and discussed with patient in details. -The cecal mass biopsy showed moderately differentiated adenocarcinoma, CT scan showed no evidence of lymph node or distant metastasis, or radiographic concern for bowel obstruction. -I will obtain CT chest without contrast to complete a staging -This appears to be early stage colon cancer.  We discussed the nature history of colon cancer, and risk of recurrence after surgical resection.  We also discussed the role of GuardanReveal test after surgery to predict her risk of recurrence if she has stage II colon cancer, and benefit of adjuvant chemotherapy if she has stage III colon cancer. -I plan to see her back 4 to 6 weeks after her colon surgery.  He is scheduled to see colorectal surgeon Dr. Marcello Moores next week.  2.  Iron deficient anemia -I will repeat her CBC and iron study today -She is not on oral iron, and has not received IV iron.  Per patient, her iron deficiency was being monitored.  3. H/o left breast DCIS, G2, ER/PR+ -Dx in 05/2014. Treated with left lumpectomy with Dr Excell Seltzer, adjuvant RT with Dr Pablo Ledger. She has been on Anastrozole since 08/2014 for 5 year therapy. He has been followed by Dr Lindi Adie.  -Bone density 06/09/2015 T score +1.1 normal, I will repeat her DEXA in the next few month   4.. Comorbidities: Arthritis, DM, HTN, GERD  -f/u with PCP Dr. Rex Kras    PLAN:  -lab today  -CT chest without contrast in [facility] imaging in the next few weeks -She is scheduled to see colorectal surgeon Dr.  Marcello Moores next week, and will proceed with surgery -I plan to see her back in 4-6 weeks after surgery, or sooner if needed. -We will copy Drs. Marcello Moores, Little and Gudena    Orders Placed This Encounter  Procedures  . CT Chest Wo Contrast    Standing Status:   Future    Standing Expiration Date:   10/02/2020    Order Specific Question:   Preferred imaging location?    Answer:   [id number]    Order Specific Question:   Radiology Contrast Protocol - do NOT remove file path    Answer:   [email or web address]  . CBC with Differential ([place] Only)    Standing Status:   Standing    Number of Occurrences:   30    Standing Expiration Date:   10/02/2020  . CMP ([facility] only)    Standing Status:   Standing    Number of Occurrences:   30    Standing Expiration Date:   10/02/2020  . Ferritin    Standing Status:   Standing    Number of Occurrences:   30    Standing Expiration Date:   10/02/2020  . Iron and TIBC    Standing Status:   Standing    Number of Occurrences:   30    Standing Expiration Date:   10/02/2020  . CEA (  IN HOUSE-[facility])    Standing Status:   Standing    Number of Occurrences:   30    Standing Expiration Date:   10/02/2020    All questions were answered. The patient knows to call the clinic with any problems, questions or concerns. The total time spent in the appointment was 45 minutes.     Truitt Merle, MD 10/03/2019 8:56 AM  I, Joslyn Devon, am acting as scribe for Truitt Merle, MD.   I have reviewed the above documentation for accuracy and completeness, and I agree with the above.

## 2019-10-03 ENCOUNTER — Encounter: Payer: Self-pay | Admitting: Hematology

## 2019-10-03 ENCOUNTER — Telehealth: Payer: Self-pay | Admitting: Hematology

## 2019-10-03 ENCOUNTER — Other Ambulatory Visit: Payer: Self-pay

## 2019-10-03 ENCOUNTER — Ambulatory Visit: Payer: Medicare Other | Admitting: Hematology and Oncology

## 2019-10-03 ENCOUNTER — Inpatient Hospital Stay: Payer: Medicare Other | Attending: Hematology | Admitting: Hematology

## 2019-10-03 ENCOUNTER — Inpatient Hospital Stay: Payer: Medicare Other

## 2019-10-03 VITALS — BP 166/65 | HR 99 | Temp 97.9°F | Resp 16 | Wt 257.8 lb

## 2019-10-03 DIAGNOSIS — Z170 Estrogen receptor positive status [ER+]: Secondary | ICD-10-CM | POA: Diagnosis not present

## 2019-10-03 DIAGNOSIS — Z800 Family history of malignant neoplasm of digestive organs: Secondary | ICD-10-CM | POA: Insufficient documentation

## 2019-10-03 DIAGNOSIS — C182 Malignant neoplasm of ascending colon: Secondary | ICD-10-CM

## 2019-10-03 DIAGNOSIS — C50412 Malignant neoplasm of upper-outer quadrant of left female breast: Secondary | ICD-10-CM | POA: Diagnosis not present

## 2019-10-03 DIAGNOSIS — Z803 Family history of malignant neoplasm of breast: Secondary | ICD-10-CM | POA: Diagnosis not present

## 2019-10-03 DIAGNOSIS — C180 Malignant neoplasm of cecum: Secondary | ICD-10-CM | POA: Diagnosis not present

## 2019-10-03 DIAGNOSIS — Z7982 Long term (current) use of aspirin: Secondary | ICD-10-CM | POA: Diagnosis not present

## 2019-10-03 DIAGNOSIS — D509 Iron deficiency anemia, unspecified: Secondary | ICD-10-CM | POA: Insufficient documentation

## 2019-10-03 DIAGNOSIS — K219 Gastro-esophageal reflux disease without esophagitis: Secondary | ICD-10-CM | POA: Insufficient documentation

## 2019-10-03 DIAGNOSIS — I10 Essential (primary) hypertension: Secondary | ICD-10-CM | POA: Insufficient documentation

## 2019-10-03 DIAGNOSIS — Z7984 Long term (current) use of oral hypoglycemic drugs: Secondary | ICD-10-CM | POA: Insufficient documentation

## 2019-10-03 DIAGNOSIS — Z808 Family history of malignant neoplasm of other organs or systems: Secondary | ICD-10-CM | POA: Diagnosis not present

## 2019-10-03 DIAGNOSIS — Z79899 Other long term (current) drug therapy: Secondary | ICD-10-CM | POA: Insufficient documentation

## 2019-10-03 DIAGNOSIS — E119 Type 2 diabetes mellitus without complications: Secondary | ICD-10-CM | POA: Diagnosis not present

## 2019-10-03 DIAGNOSIS — Z86000 Personal history of in-situ neoplasm of breast: Secondary | ICD-10-CM | POA: Insufficient documentation

## 2019-10-03 DIAGNOSIS — Z79811 Long term (current) use of aromatase inhibitors: Secondary | ICD-10-CM | POA: Insufficient documentation

## 2019-10-03 DIAGNOSIS — Z8049 Family history of malignant neoplasm of other genital organs: Secondary | ICD-10-CM | POA: Insufficient documentation

## 2019-10-03 DIAGNOSIS — Z923 Personal history of irradiation: Secondary | ICD-10-CM | POA: Insufficient documentation

## 2019-10-03 LAB — IRON AND TIBC
Iron: 34 ug/dL — ABNORMAL LOW (ref 41–142)
Saturation Ratios: 8 % — ABNORMAL LOW (ref 21–57)
TIBC: 444 ug/dL (ref 236–444)
UIBC: 410 ug/dL — ABNORMAL HIGH (ref 120–384)

## 2019-10-03 LAB — CBC WITH DIFFERENTIAL (CANCER CENTER ONLY)
Abs Immature Granulocytes: 0.03 10*3/uL (ref 0.00–0.07)
Basophils Absolute: 0 10*3/uL (ref 0.0–0.1)
Basophils Relative: 1 %
Eosinophils Absolute: 0.2 10*3/uL (ref 0.0–0.5)
Eosinophils Relative: 3 %
HCT: 33.6 % — ABNORMAL LOW (ref 36.0–46.0)
Hemoglobin: 10.4 g/dL — ABNORMAL LOW (ref 12.0–15.0)
Immature Granulocytes: 0 %
Lymphocytes Relative: 27 %
Lymphs Abs: 1.9 10*3/uL (ref 0.7–4.0)
MCH: 27.9 pg (ref 26.0–34.0)
MCHC: 31 g/dL (ref 30.0–36.0)
MCV: 90.1 fL (ref 80.0–100.0)
Monocytes Absolute: 0.5 10*3/uL (ref 0.1–1.0)
Monocytes Relative: 7 %
Neutro Abs: 4.4 10*3/uL (ref 1.7–7.7)
Neutrophils Relative %: 62 %
Platelet Count: 360 10*3/uL (ref 150–400)
RBC: 3.73 MIL/uL — ABNORMAL LOW (ref 3.87–5.11)
RDW: 14.8 % (ref 11.5–15.5)
WBC Count: 7.2 10*3/uL (ref 4.0–10.5)
nRBC: 0 % (ref 0.0–0.2)

## 2019-10-03 LAB — CMP (CANCER CENTER ONLY)
ALT: 19 U/L (ref 0–44)
AST: 22 U/L (ref 15–41)
Albumin: 3.5 g/dL (ref 3.5–5.0)
Alkaline Phosphatase: 92 U/L (ref 38–126)
Anion gap: 12 (ref 5–15)
BUN: 19 mg/dL (ref 8–23)
CO2: 24 mmol/L (ref 22–32)
Calcium: 9.5 mg/dL (ref 8.9–10.3)
Chloride: 107 mmol/L (ref 98–111)
Creatinine: 0.8 mg/dL (ref 0.44–1.00)
GFR, Est AFR Am: >60 mL/min (ref >60)
GFR, Estimated: >60 mL/min (ref >60)
Glucose, Bld: 139 mg/dL — ABNORMAL HIGH (ref 70–99)
Potassium: 4.4 mmol/L (ref 3.5–5.1)
Sodium: 143 mmol/L (ref 135–145)
Total Bilirubin: 0.4 mg/dL (ref 0.3–1.2)
Total Protein: 6.8 g/dL (ref 6.5–8.1)

## 2019-10-03 LAB — FERRITIN: Ferritin: 13 ng/mL (ref 11–307)

## 2019-10-03 LAB — CEA (IN HOUSE-CHCC): CEA (CHCC-In House): 25.46 ng/mL — ABNORMAL HIGH (ref 0.00–5.00)

## 2019-10-03 NOTE — Progress Notes (Signed)
Met with patient after her initial medical oncology appointment with Dr. Burr Medico today.  I had previously spoken to her on the phone.  She was given my card with my direct number and encouraged to call with any questions or concerns.  I explained my role as GI nurse navigator.  She verbalized an understanding.  I escorted to have labs drawn today and she understands we are ordering a CT of the chest.  Once prior approval is obtained we will get this scheduled.

## 2019-10-03 NOTE — Telephone Encounter (Signed)
No los per 6/3. °

## 2019-10-07 ENCOUNTER — Other Ambulatory Visit: Payer: Self-pay | Admitting: *Deleted

## 2019-10-07 DIAGNOSIS — C50412 Malignant neoplasm of upper-outer quadrant of left female breast: Secondary | ICD-10-CM

## 2019-10-07 MED ORDER — ANASTROZOLE 1 MG PO TABS: 1.0000 mg | ORAL_TABLET | Freq: Every day | ORAL | 1 refills | Status: DC

## 2019-10-08 ENCOUNTER — Ambulatory Visit: Payer: Self-pay | Admitting: General Surgery

## 2019-10-08 ENCOUNTER — Telehealth: Payer: Self-pay

## 2019-10-08 ENCOUNTER — Other Ambulatory Visit: Payer: Self-pay

## 2019-10-08 DIAGNOSIS — C182 Malignant neoplasm of ascending colon: Secondary | ICD-10-CM

## 2019-10-08 DIAGNOSIS — D508 Other iron deficiency anemias: Secondary | ICD-10-CM

## 2019-10-08 NOTE — H&P (Signed)
The patient is a 74 year old female who presents with colorectal cancer. 74 year old female who presents to the office for evaluation of a newly diagnosed colon adenocarcinoma. She states that she was noted to have anemia on her lab work and a colonoscopy was performed. This showed several polyps, which were removed. There was a mass noted in her cecum. Biopsies show adenocarcinoma. This was tattooed distally. She underwent a CT scan of her abdomen and pelvis which showed no signs of metastatic disease. Hemoglobin on 10/03/2019 was 10.4. CEA was 25. CT scan chest is scheduled for June 16. Abdominal surgery history significant only for laparoscopic tubal ligation. Patient has no major medical problems.   Problem List/Past Medical Leighton Ruff, MD; 11/30/1912 10:08 AM) DCIS (DUCTAL CARCINOMA IN SITU), LEFT (D05.12) COLON CANCER, ASCENDING (C18.2)  Past Surgical History Leighton Ruff, MD; 11/07/2954 10:08 AM) Breast Biopsy Bilateral. Cataract Surgery Right. Colon Polyp Removal - Colonoscopy Knee Surgery Bilateral. Oral Surgery  Diagnostic Studies History Leighton Ruff, MD; 06/02/3084 10:08 AM) Colonoscopy 1-5 years ago Mammogram within last year Pap Smear 1-5 years ago  Allergies Leighton Ruff, MD; 09/06/8467 10:08 AM) Codeine Phosphate *ANALGESICS - OPIOID* Nausea, Vomiting. Allergies Reconciled No Known Drug Allergies [07/23/2014]:  Medication History Leighton Ruff, MD; 10/02/9526 10:08 AM) metFORMIN HCl (500MG  Tablet, Oral) Active. Pravastatin Sodium (10MG  Tablet, Oral) Active. Multi-Vitamin (Oral) Active. Anastrozole (1MG  Tablet, Oral) Active. Losartan Potassium (25MG  Tablet, Oral) Active. Medications Reconciled  Social History Leighton Ruff, MD; 08/01/3242 10:08 AM) No alcohol use No caffeine use No drug use Tobacco use Never smoker.  Family History Leighton Ruff, MD; 0/05/270 10:08 AM) Breast Cancer Sister. Colon Cancer Brother. Depression  Sister. Diabetes Mellitus Mother, Sister. Heart Disease Father, Sister. Heart disease in female family member before age 65 Heart disease in female family member before age 88 Hypertension Brother, Father, Sister. Kidney Disease Sister. Ovarian Cancer Mother. Seizure disorder Sister. Thyroid problems Brother, Daughter, Sister.  Pregnancy / Birth History Leighton Ruff, MD; 09/02/6642 10:08 AM) Contraceptive History Oral contraceptives. Age at menarche 18 years. Age of menopause 12-55 Gravida 4 Irregular periods Maternal age 79-20 Para 3  Other Problems Leighton Ruff, MD; 0/06/4740 10:08 AM) Diabetes Mellitus Back Pain Gastroesophageal Reflux Disease Hemorrhoids High blood pressure Lump In Breast     Review of Systems Leighton Ruff MD; 09/07/5636 10:08 AM) General Not Present- Appetite Loss, Chills, Fatigue, Fever, Night Sweats, Weight Gain and Weight Loss. Skin Present- Dryness. Not Present- Change in Wart/Mole, Hives, Jaundice, New Lesions, Non-Healing Wounds, Rash and Ulcer. HEENT Present- Seasonal Allergies and Wears glasses/contact lenses. Not Present- Earache, Hearing Loss, Hoarseness, Nose Bleed, Oral Ulcers, Ringing in the Ears, Sinus Pain, Sore Throat, Visual Disturbances and Yellow Eyes. Respiratory Present- Chronic Cough and Snoring. Not Present- Bloody sputum, Difficulty Breathing and Wheezing. Breast Not Present- Breast Mass, Breast Pain, Nipple Discharge and Skin Changes. Cardiovascular Present- Leg Cramps and Swelling of Extremities. Not Present- Chest Pain, Difficulty Breathing Lying Down, Palpitations, Rapid Heart Rate and Shortness of Breath. Gastrointestinal Not Present- Abdominal Pain, Bloating, Bloody Stool, Change in Bowel Habits, Chronic diarrhea, Constipation, Difficulty Swallowing, Excessive gas, Gets full quickly at meals, Hemorrhoids, Indigestion, Nausea, Rectal Pain and Vomiting. Female Genitourinary Not Present- Frequency, Nocturia,  Painful Urination, Pelvic Pain and Urgency. Musculoskeletal Present- Back Pain. Not Present- Joint Pain, Joint Stiffness, Muscle Pain, Muscle Weakness and Swelling of Extremities. Neurological Not Present- Decreased Memory, Fainting, Headaches, Numbness, Seizures, Tingling, Tremor, Trouble walking and Weakness. Psychiatric Not Present- Anxiety, Bipolar, Change in Sleep Pattern, Depression, Fearful  and Frequent crying. Endocrine Present- Cold Intolerance. Not Present- Excessive Hunger, Hair Changes, Heat Intolerance, Hot flashes and New Diabetes. Hematology Not Present- Easy Bruising, Excessive bleeding, Gland problems, HIV and Persistent Infections.  Vitals (Tanisha A. Brown RMA; 10/08/2019 9:47 AM) 10/08/2019 9:47 AM Weight: 252.8 lb Height: 65in Body Surface Area: 2.19 m Body Mass Index: 42.07 kg/m  Temp.: 71F  Pulse: 127 (Regular)  BP: 148/84(Sitting, Left Arm, Standard)        Physical Exam Leighton Ruff MD; 01/08/2640 10:07 AM)  General Mental Status-Alert. General Appearance-Cooperative.  Abdomen Inspection Skin - Scar - Periumbilical. Palpation/Percussion Palpation and Percussion of the abdomen reveal - Soft and Non Tender.    Assessment & Plan Leighton Ruff MD; 09/06/3092 10:03 AM)  COLON CANCER, ASCENDING (C18.2) Impression: 74 year old female who presents to the office for evaluation of a newly diagnosed right-sided colon cancer. This was seen on recent colonoscopy done due to anemia. Mass was noted in the cecum and tattooed distally. Biopsy showed adenocarcinoma. CT scan of the abdomen and pelvis shows no sign of metastatic disease. CT scan of the chest will be completed within the next 2 weeks. Patient is relatively healthy and takes minimal medications. She has never had any abdominal surgery except for having her tubes tied laparoscopically. I have recommended a laparoscopic right colectomy. We have discussed this in detail, including approximately 20%  risk of incisional hernia. The surgery and anatomy were described to the patient as well as the risks of surgery and the possible complications. These include: Bleeding, deep abdominal infections and possible wound complications such as hernia and infection, damage to adjacent structures, leak of surgical connections, which can lead to other surgeries and possibly an ostomy, possible need for other procedures, such as abscess drains in radiology, possible prolonged hospital stay, possible diarrhea from removal of part of the colon, possible constipation from narcotics, possible bowel, bladder or sexual dysfunction if having rectal surgery, prolonged fatigue/weakness or appetite loss, possible early recurrence of of disease, possible complications of their medical problems such as heart disease or arrhythmias or lung problems, death (less than 1%). I believe the patient understands and wishes to proceed with the surgery.

## 2019-10-08 NOTE — H&P (View-Only) (Signed)
The patient is a 74 year old female who presents with colorectal cancer. 74 year old female who presents to the office for evaluation of a newly diagnosed colon adenocarcinoma. She states that she was noted to have anemia on her lab work and a colonoscopy was performed. This showed several polyps, which were removed. There was a mass noted in her cecum. Biopsies show adenocarcinoma. This was tattooed distally. She underwent a CT scan of her abdomen and pelvis which showed no signs of metastatic disease. Hemoglobin on 10/03/2019 was 10.4. CEA was 25. CT scan chest is scheduled for June 16. Abdominal surgery history significant only for laparoscopic tubal ligation. Patient has no major medical problems.   Problem List/Past Medical Leighton Ruff, MD; 08/06/8293 10:08 AM) DCIS (DUCTAL CARCINOMA IN SITU), LEFT (D05.12) COLON CANCER, ASCENDING (C18.2)  Past Surgical History Leighton Ruff, MD; 10/02/1306 10:08 AM) Breast Biopsy Bilateral. Cataract Surgery Right. Colon Polyp Removal - Colonoscopy Knee Surgery Bilateral. Oral Surgery  Diagnostic Studies History Leighton Ruff, MD; 10/04/7844 10:08 AM) Colonoscopy 1-5 years ago Mammogram within last year Pap Smear 1-5 years ago  Allergies Leighton Ruff, MD; 01/06/2951 10:08 AM) Codeine Phosphate *ANALGESICS - OPIOID* Nausea, Vomiting. Allergies Reconciled No Known Drug Allergies [07/23/2014]:  Medication History Leighton Ruff, MD; 12/04/1322 10:08 AM) metFORMIN HCl (500MG  Tablet, Oral) Active. Pravastatin Sodium (10MG  Tablet, Oral) Active. Multi-Vitamin (Oral) Active. Anastrozole (1MG  Tablet, Oral) Active. Losartan Potassium (25MG  Tablet, Oral) Active. Medications Reconciled  Social History Leighton Ruff, MD; 4/0/1027 10:08 AM) No alcohol use No caffeine use No drug use Tobacco use Never smoker.  Family History Leighton Ruff, MD; 06/06/3662 10:08 AM) Breast Cancer Sister. Colon Cancer Brother. Depression  Sister. Diabetes Mellitus Mother, Sister. Heart Disease Father, Sister. Heart disease in female family member before age 59 Heart disease in female family member before age 79 Hypertension Brother, Father, Sister. Kidney Disease Sister. Ovarian Cancer Mother. Seizure disorder Sister. Thyroid problems Brother, Daughter, Sister.  Pregnancy / Birth History Leighton Ruff, MD; 4/0/3474 10:08 AM) Contraceptive History Oral contraceptives. Age at menarche 58 years. Age of menopause 80-55 Gravida 4 Irregular periods Maternal age 59-20 Para 3  Other Problems Leighton Ruff, MD; 06/06/9561 10:08 AM) Diabetes Mellitus Back Pain Gastroesophageal Reflux Disease Hemorrhoids High blood pressure Lump In Breast     Review of Systems Leighton Ruff MD; 12/06/5641 10:08 AM) General Not Present- Appetite Loss, Chills, Fatigue, Fever, Night Sweats, Weight Gain and Weight Loss. Skin Present- Dryness. Not Present- Change in Wart/Mole, Hives, Jaundice, New Lesions, Non-Healing Wounds, Rash and Ulcer. HEENT Present- Seasonal Allergies and Wears glasses/contact lenses. Not Present- Earache, Hearing Loss, Hoarseness, Nose Bleed, Oral Ulcers, Ringing in the Ears, Sinus Pain, Sore Throat, Visual Disturbances and Yellow Eyes. Respiratory Present- Chronic Cough and Snoring. Not Present- Bloody sputum, Difficulty Breathing and Wheezing. Breast Not Present- Breast Mass, Breast Pain, Nipple Discharge and Skin Changes. Cardiovascular Present- Leg Cramps and Swelling of Extremities. Not Present- Chest Pain, Difficulty Breathing Lying Down, Palpitations, Rapid Heart Rate and Shortness of Breath. Gastrointestinal Not Present- Abdominal Pain, Bloating, Bloody Stool, Change in Bowel Habits, Chronic diarrhea, Constipation, Difficulty Swallowing, Excessive gas, Gets full quickly at meals, Hemorrhoids, Indigestion, Nausea, Rectal Pain and Vomiting. Female Genitourinary Not Present- Frequency, Nocturia,  Painful Urination, Pelvic Pain and Urgency. Musculoskeletal Present- Back Pain. Not Present- Joint Pain, Joint Stiffness, Muscle Pain, Muscle Weakness and Swelling of Extremities. Neurological Not Present- Decreased Memory, Fainting, Headaches, Numbness, Seizures, Tingling, Tremor, Trouble walking and Weakness. Psychiatric Not Present- Anxiety, Bipolar, Change in Sleep Pattern, Depression, Fearful  and Frequent crying. Endocrine Present- Cold Intolerance. Not Present- Excessive Hunger, Hair Changes, Heat Intolerance, Hot flashes and New Diabetes. Hematology Not Present- Easy Bruising, Excessive bleeding, Gland problems, HIV and Persistent Infections.  Vitals (Tanisha A. Brown RMA; 10/08/2019 9:47 AM) 10/08/2019 9:47 AM Weight: 252.8 lb Height: 65in Body Surface Area: 2.19 m Body Mass Index: 42.07 kg/m  Temp.: 24F  Pulse: 127 (Regular)  BP: 148/84(Sitting, Left Arm, Standard)        Physical Exam Leighton Ruff MD; 7/0/9643 10:07 AM)  General Mental Status-Alert. General Appearance-Cooperative.  Abdomen Inspection Skin - Scar - Periumbilical. Palpation/Percussion Palpation and Percussion of the abdomen reveal - Soft and Non Tender.    Assessment & Plan Leighton Ruff MD; 12/03/8182 10:03 AM)  COLON CANCER, ASCENDING (C18.2) Impression: 74 year old female who presents to the office for evaluation of a newly diagnosed right-sided colon cancer. This was seen on recent colonoscopy done due to anemia. Mass was noted in the cecum and tattooed distally. Biopsy showed adenocarcinoma. CT scan of the abdomen and pelvis shows no sign of metastatic disease. CT scan of the chest will be completed within the next 2 weeks. Patient is relatively healthy and takes minimal medications. She has never had any abdominal surgery except for having her tubes tied laparoscopically. I have recommended a laparoscopic right colectomy. We have discussed this in detail, including approximately 20%  risk of incisional hernia. The surgery and anatomy were described to the patient as well as the risks of surgery and the possible complications. These include: Bleeding, deep abdominal infections and possible wound complications such as hernia and infection, damage to adjacent structures, leak of surgical connections, which can lead to other surgeries and possibly an ostomy, possible need for other procedures, such as abscess drains in radiology, possible prolonged hospital stay, possible diarrhea from removal of part of the colon, possible constipation from narcotics, possible bowel, bladder or sexual dysfunction if having rectal surgery, prolonged fatigue/weakness or appetite loss, possible early recurrence of of disease, possible complications of their medical problems such as heart disease or arrhythmias or lung problems, death (less than 1%). I believe the patient understands and wishes to proceed with the surgery.

## 2019-10-08 NOTE — Telephone Encounter (Signed)
I spoke with Ms Linch.  I relayed Dr. Ernestina Penna comments and recommendations. She is having labs later this my at her PCP and will have those results faxed to use at [phone].  She verbalized understanding.  Lab appt made and labs ordered.

## 2019-10-08 NOTE — Telephone Encounter (Signed)
-----   Message from Truitt Merle, MD sent at 10/08/2019 10:08 AM EDT ----- My nurse, please let pt know her lab results, she has IDA, from her colon cancer. Please make sure she is taking OTC iron 2-3 tabs daily. She has appointment with Dr.Gudena on 7/12, please order CBC, ferritin, iron+TIBC and CMP before she sees Dr. Lindi Adie. if iron still low by then, will give her iv iron. Thanks   Truitt Merle

## 2019-10-16 ENCOUNTER — Ambulatory Visit
Admission: RE | Admit: 2019-10-16 | Discharge: 2019-10-16 | Disposition: A | Discharge status: Home / Self Care | Payer: Medicare Other | Source: Ambulatory Visit | Attending: Hematology | Admitting: Hematology

## 2019-10-16 ENCOUNTER — Other Ambulatory Visit: Payer: Self-pay

## 2019-10-16 DIAGNOSIS — C182 Malignant neoplasm of ascending colon: Secondary | ICD-10-CM

## 2019-10-16 DIAGNOSIS — R918 Other nonspecific abnormal finding of lung field: Secondary | ICD-10-CM | POA: Diagnosis not present

## 2019-10-21 DIAGNOSIS — N390 Urinary tract infection, site not specified: Secondary | ICD-10-CM | POA: Diagnosis not present

## 2019-10-21 DIAGNOSIS — E1169 Type 2 diabetes mellitus with other specified complication: Secondary | ICD-10-CM | POA: Diagnosis not present

## 2019-10-22 ENCOUNTER — Encounter (HOSPITAL_COMMUNITY): Payer: Self-pay

## 2019-10-22 NOTE — Patient Instructions (Addendum)
DUE TO COVID-19 ONLY ONE VISITOR ARE ALLOWED TO COME WITH YOU AND STAY IN THE WAITING ROOM ONLY DURING PRE OP AND PROCEDURE. THEN TWO VISITORS MAY VISIT WITH YOU IN YOUR PRIVATE ROOM DURING VISITING HOURS ONLY!!   COVID SWAB TESTING MUST BE COMPLETED ON:  Tuesday, October 29, 2019 at 9:30 AM    [address] [place] [facility] enter pre surgical testing line (Must self quarantine after testing. Follow instructions on handout.)             Your procedure is scheduled on: Friday, November 01, 2019   Report to [facility] Main  Entrance    Report to admitting at 31 Noon   Call this number if you have problems the morning of surgery [phone]   Do not eat food:After Midnight.   May have liquids until 11:00 AM day of surgery   CLEAR LIQUID DIET  Foods Allowed                                                                     Foods Excluded  Water, Black Coffee and tea, regular and decaf                             liquids that you cannot  Plain Jell-O in any flavor  (No red)                                           see through such as: Fruit ices (not with fruit pulp)                                     milk, soups, orange juice  Iced Popsicles (No red)                                    All solid food                                   Apple juices Sports drinks like Gatorade (No red) Lightly seasoned clear broth or consume(fat free) Sugar, honey syrup  Sample Menu Breakfast                                Lunch                                     Supper Cranberry juice                    Beef broth                            Chicken broth Jell-O  Grape juice                           Apple juice Coffee or tea                        Jell-O                                      Popsicle                                                Coffee or tea                        Coffee or tea   Drink 2 G2 drinks the night  before surgery.  Complete one G2 drink the morning of surgery at 11:00 AM day of  surgery   Oral Hygiene is also important to reduce your risk of infection.                                    Remember - BRUSH YOUR TEETH THE MORNING OF SURGERY WITH YOUR REGULAR TOOTHPASTE   Do NOT smoke after Midnight   Take these medicines the morning of surgery with A SIP OF WATER: Anastrozole, Omeprazole, Pravastatin  DO NOT TAKE ANY ORAL DIABETIC MEDICATIONS DAY OF YOUR SURGERY                               You may not have any metal on your body including hair pins, jewelry, and body piercings             Do not wear make-up, lotions, powders, perfumes/cologne, or deodorant             Do not wear nail polish.  Do not shave  48 hours prior to surgery.               Do not bring valuables to the hospital. [place].   Contacts, dentures or bridgework may not be worn into surgery.   Bring small overnight bag day of surgery.   Special Instructions: Bring a copy of your healthcare power of attorney and living will documents         the day of surgery if you haven't scanned them in before.              Please read over the following fact sheets you were given: IF YOU HAVE QUESTIONS ABOUT YOUR PRE OP  [place] [phone]  How to Manage Your Diabetes Before and After Surgery  Why is it important to control my blood sugar before and after surgery? . Improving blood sugar levels before and after surgery helps healing and can limit problems. . A way of improving blood sugar control is eating a healthy diet by: o  Eating less sugar and carbohydrates o  Increasing activity/exercise o  Talking with your doctor about reaching your blood sugar goals .  High blood sugars (greater than 180 mg/dL) can raise your risk of infections and slow your recovery, so you will need to focus on controlling your diabetes during the weeks before  surgery. . Make sure that the doctor who takes care of your diabetes knows about your planned surgery including the date and location.  How do I manage my blood sugar before surgery? . Check your blood sugar at least 4 times a day, starting 2 days before surgery, to make sure that the level is not too high or low. o Check your blood sugar the morning of your surgery when you wake up and every 2 hours until you get to the Short Stay unit. . If your blood sugar is less than 70 mg/dL, you will need to treat for low blood sugar: o Do not take insulin. o Treat a low blood sugar (less than 70 mg/dL) with  cup of clear juice (cranberry or apple), 4 glucose tablets, OR glucose gel. o Recheck blood sugar in 15 minutes after treatment (to make sure it is greater than 70 mg/dL). If your blood sugar is not greater than 70 mg/dL on recheck, call [phone] for further instructions. . Report your blood sugar to the short stay nurse when you get to Short Stay.  . If you are admitted to the hospital after surgery: o Your blood sugar will be checked by the staff and you will probably be given insulin after surgery (instead of oral diabetes medicines) to make sure you have good blood sugar levels. o The goal for blood sugar control after surgery is 80-180 mg/dL.   WHAT DO I DO ABOUT MY DIABETES MEDICATION?  Marland Kitchen Do not take oral diabetes medicines (pills) the morning of surgery.  Reviewed and Endorsed by [facility] Patient Education Committee, August 2015   [facility] - Preparing for Surgery Before surgery, you can play an important role.  Because skin is not sterile, your skin needs to be as free of germs as possible.  You can reduce the number of germs on your skin by washing with CHG (chlorahexidine gluconate) soap before surgery.  CHG is an antiseptic cleaner which kills germs and bonds with the skin to continue killing germs even after washing. Please DO NOT use if you have an allergy to CHG or  antibacterial soaps.  If your skin becomes reddened/irritated stop using the CHG and inform your nurse when you arrive at Short Stay. Do not shave (including legs and underarms) for at least 48 hours prior to the first CHG shower.  You may shave your face/neck.  Please follow these instructions carefully:  1.  Shower with CHG Soap the night before surgery and the  morning of surgery.  2.  If you choose to wash your hair, wash your hair first as usual with your normal  shampoo.  3.  After you shampoo, rinse your hair and body thoroughly to remove the shampoo.                             4.  Use CHG as you would any other liquid soap.  You can apply chg directly to the skin and wash.  Gently with a scrungie or clean washcloth.  5.  Apply the CHG Soap to your body ONLY FROM THE NECK DOWN.   Do   not use on face/ open  Wound or open sores. Avoid contact with eyes, ears mouth and   genitals (private parts).                       Wash face,  Genitals (private parts) with your normal soap.             6.  Wash thoroughly, paying special attention to the area where your    surgery  will be performed.  7.  Thoroughly rinse your body with warm water from the neck down.  8.  DO NOT shower/wash with your normal soap after using and rinsing off the CHG Soap.                9.  Pat yourself dry with a clean towel.            10.  Wear clean pajamas.            11.  Place clean sheets on your bed the night of your first shower and do not  sleep with pets. Day of Surgery : Do not apply any lotions/deodorants the morning of surgery.  Please wear clean clothes to the hospital/surgery center.  FAILURE TO FOLLOW THESE INSTRUCTIONS MAY RESULT IN THE CANCELLATION OF YOUR SURGERY  PATIENT SIGNATURE_________________________________  NURSE SIGNATURE__________________________________  ________________________________________________________________________   Adam Phenix  An  incentive spirometer is a tool that can help keep your lungs clear and active. This tool measures how well you are filling your lungs with each breath. Taking long deep breaths may help reverse or decrease the chance of developing breathing (pulmonary) problems (especially infection) following:  A long period of time when you are unable to move or be active. BEFORE THE PROCEDURE   If the spirometer includes an indicator to show your best effort, your nurse or respiratory therapist will set it to a desired goal.  If possible, sit up straight or lean slightly forward. Try not to slouch.  Hold the incentive spirometer in an upright position. INSTRUCTIONS FOR USE  1. Sit on the edge of your bed if possible, or sit up as far as you can in bed or on a chair. 2. Hold the incentive spirometer in an upright position. 3. Breathe out normally. 4. Place the mouthpiece in your mouth and seal your lips tightly around it. 5. Breathe in slowly and as deeply as possible, raising the piston or the ball toward the top of the column. 6. Hold your breath for 3-5 seconds or for as long as possible. Allow the piston or ball to fall to the bottom of the column. 7. Remove the mouthpiece from your mouth and breathe out normally. 8. Rest for a few seconds and repeat Steps 1 through 7 at least 10 times every 1-2 hours when you are awake. Take your time and take a few normal breaths between deep breaths. 9. The spirometer may include an indicator to show your best effort. Use the indicator as a goal to work toward during each repetition. 10. After each set of 10 deep breaths, practice coughing to be sure your lungs are clear. If you have an incision (the cut made at the time of surgery), support your incision when coughing by placing a pillow or rolled up towels firmly against it. Once you are able to get out of bed, walk around indoors and cough well. You may stop using the incentive spirometer when instructed by your  caregiver.  RISKS AND COMPLICATIONS  Take your  time so you do not get dizzy or light-headed.  If you are in pain, you may need to take or ask for pain medication before doing incentive spirometry. It is harder to take a deep breath if you are having pain. AFTER USE  Rest and breathe slowly and easily.  It can be helpful to keep track of a log of your progress. Your caregiver can provide you with a simple table to help with this. If you are using the spirometer at home, follow these instructions: [place] IF:   You are having difficultly using the spirometer.  You have trouble using the spirometer as often as instructed.  Your pain medication is not giving enough relief while using the spirometer.  You develop fever of 100.5 F (38.1 C) or higher. SEEK IMMEDIATE MEDICAL CARE IF:   You cough up bloody sputum that had not been present before.  You develop fever of 102 F (38.9 C) or greater.  You develop worsening pain at or near the incision site. MAKE SURE YOU:   Understand these instructions.  Will watch your condition.  Will get help right away if you are not doing well or get worse. Document Released: 08/29/2006 Document Revised: 07/11/2011 Document Reviewed: 10/30/2006 Exit[employer] Patient Information 2014 ExitCare, [place].   ________________________________________________________________________  WHAT IS A BLOOD TRANSFUSION? Blood Transfusion Information  A transfusion is the replacement of blood or some of its parts. Blood is made up of multiple cells which provide different functions.  Red blood cells carry oxygen and are used for blood loss replacement.  White blood cells fight against infection.  Platelets control bleeding.  Plasma helps clot blood.  Other blood products are available for specialized needs, such as hemophilia or other clotting disorders. BEFORE THE TRANSFUSION  Who gives blood for transfusions?   Healthy volunteers who are fully  evaluated to make sure their blood is safe. This is blood bank blood. Transfusion therapy is the safest it has ever been in the practice of medicine. Before blood is taken from a donor, a complete history is taken to make sure that person has no history of diseases nor engages in risky social behavior (examples are intravenous drug use or sexual activity with multiple partners). The donor's travel history is screened to minimize risk of transmitting infections, such as malaria. The donated blood is tested for signs of infectious diseases, such as HIV and hepatitis. The blood is then tested to be sure it is compatible with you in order to minimize the chance of a transfusion reaction. If you or a relative donates blood, this is often done in anticipation of surgery and is not appropriate for emergency situations. It takes many days to process the donated blood. RISKS AND COMPLICATIONS Although transfusion therapy is very safe and saves many lives, the main dangers of transfusion include:   Getting an infectious disease.  Developing a transfusion reaction. This is an allergic reaction to something in the blood you were given. Every precaution is taken to prevent this. The decision to have a blood transfusion has been considered carefully by your caregiver before blood is given. Blood is not given unless the benefits outweigh the risks. AFTER THE TRANSFUSION  Right after receiving a blood transfusion, you will usually feel much better and more energetic. This is especially true if your red blood cells have gotten low (anemic). The transfusion raises the level of the red blood cells which carry oxygen, and this usually causes an energy increase.  The  nurse administering the transfusion will monitor you carefully for complications. HOME CARE INSTRUCTIONS  No special instructions are needed after a transfusion. You may find your energy is better. Speak with your caregiver about any limitations on activity  for underlying diseases you may have. SEEK MEDICAL CARE IF:   Your condition is not improving after your transfusion.  You develop redness or irritation at the intravenous (IV) site. SEEK IMMEDIATE MEDICAL CARE IF:  Any of the following symptoms occur over the next 12 hours:  Shaking chills.  You have a temperature by mouth above 102 F (38.9 C), not controlled by medicine.  Chest, back, or muscle pain.  People around you feel you are not acting correctly or are confused.  Shortness of breath or difficulty breathing.  Dizziness and fainting.  You get a rash or develop hives.  You have a decrease in urine output.  Your urine turns a dark color or changes to pink, red, or brown. Any of the following symptoms occur over the next 10 days:  You have a temperature by mouth above 102 F (38.9 C), not controlled by medicine.  Shortness of breath.  Weakness after normal activity.  The white part of the eye turns yellow (jaundice).  You have a decrease in the amount of urine or are urinating less often.  Your urine turns a dark color or changes to pink, red, or brown. Document Released: 04/15/2000 Document Revised: 07/11/2011 Document Reviewed: 12/03/2007 [facility] Patient Information 2014 [place], [place].  _______________________________________________________________________

## 2019-10-22 NOTE — Progress Notes (Addendum)
COVID Vaccine Completed: Yes Date COVID Vaccine completed: 07/16/2019 COVID vaccine manufacturer: Pfizer      PCP - Dr. Lawernce Pitts Cardiologist - N/A Oncologist Dr. Burr Medico last office visit 10/03/19 in epic  Chest x-ray - greater than 1 year CT Chest 10/16/19 in epic EKG - 10/23/2019 IN Epic Stress Test - N/A ECHO - N/A Cardiac Cath - N/A  Sleep Study - N/A CPAP - N/A  Fasting Blood Sugar - does not check at home Checks Blood Sugar __N/A___ times a day  Blood Thinner Instructions: N/A Aspirin Instructions:  Instructed to call Dr Marcello Moores for instructions Last Dose:  Anesthesia review: N/A  Patient denies shortness of breath, fever, cough and chest pain at PAT appointment   Patient verbalized understanding of instructions that were given to them at the PAT appointment. Patient was also instructed that they will need to review over the PAT instructions again at home before surgery.

## 2019-10-23 ENCOUNTER — Other Ambulatory Visit: Payer: Self-pay

## 2019-10-23 ENCOUNTER — Encounter (HOSPITAL_COMMUNITY): Payer: Self-pay

## 2019-10-23 ENCOUNTER — Encounter (HOSPITAL_COMMUNITY)
Admission: RE | Admit: 2019-10-23 | Discharge: 2019-10-23 | Disposition: A | Discharge status: Home / Self Care | Payer: Medicare Other | Source: Ambulatory Visit | Attending: General Surgery | Admitting: General Surgery

## 2019-10-23 DIAGNOSIS — N390 Urinary tract infection, site not specified: Secondary | ICD-10-CM | POA: Diagnosis not present

## 2019-10-23 DIAGNOSIS — Z7984 Long term (current) use of oral hypoglycemic drugs: Secondary | ICD-10-CM | POA: Diagnosis not present

## 2019-10-23 DIAGNOSIS — Z01818 Encounter for other preprocedural examination: Secondary | ICD-10-CM | POA: Diagnosis not present

## 2019-10-23 DIAGNOSIS — K219 Gastro-esophageal reflux disease without esophagitis: Secondary | ICD-10-CM | POA: Diagnosis not present

## 2019-10-23 DIAGNOSIS — I10 Essential (primary) hypertension: Secondary | ICD-10-CM | POA: Diagnosis not present

## 2019-10-23 DIAGNOSIS — Z79899 Other long term (current) drug therapy: Secondary | ICD-10-CM | POA: Diagnosis not present

## 2019-10-23 DIAGNOSIS — E119 Type 2 diabetes mellitus without complications: Secondary | ICD-10-CM | POA: Diagnosis not present

## 2019-10-23 DIAGNOSIS — C182 Malignant neoplasm of ascending colon: Secondary | ICD-10-CM | POA: Insufficient documentation

## 2019-10-23 DIAGNOSIS — R899 Unspecified abnormal finding in specimens from other organs, systems and tissues: Secondary | ICD-10-CM | POA: Diagnosis not present

## 2019-10-23 DIAGNOSIS — E1169 Type 2 diabetes mellitus with other specified complication: Secondary | ICD-10-CM | POA: Diagnosis not present

## 2019-10-23 DIAGNOSIS — D0512 Intraductal carcinoma in situ of left breast: Secondary | ICD-10-CM | POA: Diagnosis not present

## 2019-10-23 HISTORY — DX: Basal cell carcinoma of skin, unspecified: C44.91

## 2019-10-23 HISTORY — DX: Atherosclerosis of aorta: I70.0

## 2019-10-23 HISTORY — DX: Polyneuropathy, unspecified: G62.9

## 2019-10-23 HISTORY — DX: Iron deficiency anemia, unspecified: D50.9

## 2019-10-23 HISTORY — DX: Other nonspecific abnormal finding of lung field: R91.8

## 2019-10-23 HISTORY — DX: Personal history of other diseases of the digestive system: Z87.19

## 2019-10-23 LAB — BASIC METABOLIC PANEL
Anion gap: 14 (ref 5–15)
BUN: 17 mg/dL (ref 8–23)
CO2: 24 mmol/L (ref 22–32)
Calcium: 9 mg/dL (ref 8.9–10.3)
Chloride: 103 mmol/L (ref 98–111)
Creatinine, Ser: 0.77 mg/dL (ref 0.44–1.00)
GFR calc Af Amer: >60 mL/min (ref >60)
GFR calc non Af Amer: >60 mL/min (ref >60)
Glucose, Bld: 172 mg/dL — ABNORMAL HIGH (ref 70–99)
Potassium: 3.8 mmol/L (ref 3.5–5.1)
Sodium: 141 mmol/L (ref 135–145)

## 2019-10-23 LAB — CBC
HCT: 37.2 % (ref 36.0–46.0)
Hemoglobin: 11.3 g/dL — ABNORMAL LOW (ref 12.0–15.0)
MCH: 27.5 pg (ref 26.0–34.0)
MCHC: 30.4 g/dL (ref 30.0–36.0)
MCV: 90.5 fL (ref 80.0–100.0)
Platelets: 357 10*3/uL (ref 150–400)
RBC: 4.11 MIL/uL (ref 3.87–5.11)
RDW: 16.2 % — ABNORMAL HIGH (ref 11.5–15.5)
WBC: 7.2 10*3/uL (ref 4.0–10.5)
nRBC: 0 % (ref 0.0–0.2)

## 2019-10-23 LAB — HEMOGLOBIN A1C
Hgb A1c MFr Bld: 6.8 % — ABNORMAL HIGH (ref 4.8–5.6)
Mean Plasma Glucose: 148.46 mg/dL

## 2019-10-23 LAB — GLUCOSE, CAPILLARY: Glucose-Capillary: 168 mg/dL — ABNORMAL HIGH (ref 70–99)

## 2019-10-24 LAB — ABO/RH: ABO/RH(D): O POS

## 2019-10-29 ENCOUNTER — Other Ambulatory Visit (HOSPITAL_COMMUNITY)
Admission: RE | Admit: 2019-10-29 | Discharge: 2019-10-29 | Disposition: A | Discharge status: Home / Self Care | Payer: Medicare Other | Source: Ambulatory Visit | Attending: General Surgery | Admitting: General Surgery

## 2019-10-29 DIAGNOSIS — C188 Malignant neoplasm of overlapping sites of colon: Secondary | ICD-10-CM | POA: Diagnosis not present

## 2019-10-29 DIAGNOSIS — C189 Malignant neoplasm of colon, unspecified: Secondary | ICD-10-CM | POA: Diagnosis not present

## 2019-10-29 DIAGNOSIS — I2510 Atherosclerotic heart disease of native coronary artery without angina pectoris: Secondary | ICD-10-CM | POA: Diagnosis not present

## 2019-10-29 DIAGNOSIS — C182 Malignant neoplasm of ascending colon: Secondary | ICD-10-CM | POA: Diagnosis not present

## 2019-10-29 DIAGNOSIS — Z6841 Body Mass Index (BMI) 40.0 and over, adult: Secondary | ICD-10-CM | POA: Diagnosis not present

## 2019-10-29 DIAGNOSIS — Z803 Family history of malignant neoplasm of breast: Secondary | ICD-10-CM | POA: Diagnosis not present

## 2019-10-29 DIAGNOSIS — Z79899 Other long term (current) drug therapy: Secondary | ICD-10-CM | POA: Diagnosis not present

## 2019-10-29 DIAGNOSIS — Z01812 Encounter for preprocedural laboratory examination: Secondary | ICD-10-CM | POA: Insufficient documentation

## 2019-10-29 DIAGNOSIS — Z800 Family history of malignant neoplasm of digestive organs: Secondary | ICD-10-CM | POA: Diagnosis not present

## 2019-10-29 DIAGNOSIS — Z853 Personal history of malignant neoplasm of breast: Secondary | ICD-10-CM | POA: Diagnosis not present

## 2019-10-29 DIAGNOSIS — Z20822 Contact with and (suspected) exposure to covid-19: Secondary | ICD-10-CM | POA: Diagnosis not present

## 2019-10-29 DIAGNOSIS — Z7984 Long term (current) use of oral hypoglycemic drugs: Secondary | ICD-10-CM | POA: Diagnosis not present

## 2019-10-29 DIAGNOSIS — K219 Gastro-esophageal reflux disease without esophagitis: Secondary | ICD-10-CM | POA: Diagnosis not present

## 2019-10-29 DIAGNOSIS — E119 Type 2 diabetes mellitus without complications: Secondary | ICD-10-CM | POA: Diagnosis not present

## 2019-10-29 DIAGNOSIS — I10 Essential (primary) hypertension: Secondary | ICD-10-CM | POA: Diagnosis not present

## 2019-10-29 LAB — SARS CORONAVIRUS 2 (TAT 6-24 HRS): SARS Coronavirus 2: NEGATIVE

## 2019-10-31 MED ORDER — BUPIVACAINE LIPOSOME 1.3 % IJ SUSP
20.0000 mL | Freq: Once | INTRAMUSCULAR | Status: DC
  Filled 2019-10-31: qty 20

## 2019-11-01 ENCOUNTER — Inpatient Hospital Stay (HOSPITAL_COMMUNITY)
Admission: RE | Admit: 2019-11-01 | Discharge: 2019-11-05 | DRG: 330 | Disposition: A | Discharge status: Home / Self Care | Payer: Medicare Other | Attending: General Surgery | Admitting: General Surgery

## 2019-11-01 ENCOUNTER — Encounter (HOSPITAL_COMMUNITY): Payer: Self-pay | Admitting: General Surgery

## 2019-11-01 ENCOUNTER — Inpatient Hospital Stay (HOSPITAL_COMMUNITY): Payer: Medicare Other | Admitting: Certified Registered"

## 2019-11-01 ENCOUNTER — Encounter (HOSPITAL_COMMUNITY)
Admission: RE | Disposition: A | Discharge status: Home / Self Care | Payer: Self-pay | Source: Home / Self Care | Attending: General Surgery

## 2019-11-01 ENCOUNTER — Other Ambulatory Visit: Payer: Self-pay

## 2019-11-01 DIAGNOSIS — I10 Essential (primary) hypertension: Secondary | ICD-10-CM | POA: Diagnosis present

## 2019-11-01 DIAGNOSIS — Z79899 Other long term (current) drug therapy: Secondary | ICD-10-CM

## 2019-11-01 DIAGNOSIS — C189 Malignant neoplasm of colon, unspecified: Secondary | ICD-10-CM | POA: Diagnosis present

## 2019-11-01 DIAGNOSIS — Z6841 Body Mass Index (BMI) 40.0 and over, adult: Secondary | ICD-10-CM | POA: Diagnosis not present

## 2019-11-01 DIAGNOSIS — K219 Gastro-esophageal reflux disease without esophagitis: Secondary | ICD-10-CM | POA: Diagnosis present

## 2019-11-01 DIAGNOSIS — I2510 Atherosclerotic heart disease of native coronary artery without angina pectoris: Secondary | ICD-10-CM | POA: Diagnosis present

## 2019-11-01 DIAGNOSIS — Z803 Family history of malignant neoplasm of breast: Secondary | ICD-10-CM | POA: Diagnosis not present

## 2019-11-01 DIAGNOSIS — E119 Type 2 diabetes mellitus without complications: Secondary | ICD-10-CM | POA: Diagnosis present

## 2019-11-01 DIAGNOSIS — Z7984 Long term (current) use of oral hypoglycemic drugs: Secondary | ICD-10-CM | POA: Diagnosis not present

## 2019-11-01 DIAGNOSIS — Z853 Personal history of malignant neoplasm of breast: Secondary | ICD-10-CM

## 2019-11-01 DIAGNOSIS — Z20822 Contact with and (suspected) exposure to covid-19: Secondary | ICD-10-CM | POA: Diagnosis present

## 2019-11-01 DIAGNOSIS — Z800 Family history of malignant neoplasm of digestive organs: Secondary | ICD-10-CM | POA: Diagnosis not present

## 2019-11-01 DIAGNOSIS — C182 Malignant neoplasm of ascending colon: Secondary | ICD-10-CM | POA: Diagnosis present

## 2019-11-01 HISTORY — PX: LAPAROSCOPIC PARTIAL COLECTOMY: SHX5907

## 2019-11-01 LAB — GLUCOSE, CAPILLARY
Glucose-Capillary: 129 mg/dL — ABNORMAL HIGH (ref 70–99)
Glucose-Capillary: 171 mg/dL — ABNORMAL HIGH (ref 70–99)

## 2019-11-01 LAB — TYPE AND SCREEN
ABO/RH(D): O POS
Antibody Screen: NEGATIVE

## 2019-11-01 SURGERY — LAPAROSCOPIC PARTIAL COLECTOMY
Anesthesia: General

## 2019-11-01 MED ORDER — ALVIMOPAN 12 MG PO CAPS
12.0000 mg | ORAL_CAPSULE | Freq: Two times a day (BID) | ORAL | Status: DC
  Administered 2019-11-02 – 2019-11-03 (×3): 12 mg via ORAL
  Filled 2019-11-01 (×5): qty 1

## 2019-11-01 MED ORDER — FENTANYL CITRATE (PF) 100 MCG/2ML IJ SOLN
INTRAMUSCULAR | Status: AC
  Administered 2019-11-01: 50 ug via INTRAVENOUS
  Filled 2019-11-01: qty 2

## 2019-11-01 MED ORDER — FENTANYL CITRATE (PF) 100 MCG/2ML IJ SOLN
25.0000 ug | INTRAMUSCULAR | Status: DC | PRN
  Administered 2019-11-01: 50 ug via INTRAVENOUS

## 2019-11-01 MED ORDER — ACETAMINOPHEN 500 MG PO TABS
1000.0000 mg | ORAL_TABLET | ORAL | Status: AC
  Administered 2019-11-01: 1000 mg via ORAL
  Filled 2019-11-01: qty 2

## 2019-11-01 MED ORDER — AMISULPRIDE (ANTIEMETIC) 5 MG/2ML IV SOLN
INTRAVENOUS | Status: AC
  Filled 2019-11-01: qty 4

## 2019-11-01 MED ORDER — ROCURONIUM BROMIDE 10 MG/ML (PF) SYRINGE
PREFILLED_SYRINGE | INTRAVENOUS | Status: DC | PRN
  Administered 2019-11-01: 100 mg via INTRAVENOUS

## 2019-11-01 MED ORDER — 0.9 % SODIUM CHLORIDE (POUR BTL) OPTIME
TOPICAL | Status: DC | PRN
  Administered 2019-11-01: 2000 mL

## 2019-11-01 MED ORDER — LACTATED RINGERS IV SOLN: INTRAVENOUS | Status: DC | PRN

## 2019-11-01 MED ORDER — ACETAMINOPHEN 500 MG PO TABS
1000.0000 mg | ORAL_TABLET | Freq: Four times a day (QID) | ORAL | Status: DC
  Administered 2019-11-01 – 2019-11-04 (×11): 1000 mg via ORAL
  Filled 2019-11-01 (×13): qty 2

## 2019-11-01 MED ORDER — LACTATED RINGERS IR SOLN
Status: DC | PRN
  Administered 2019-11-01: 1000 mL

## 2019-11-01 MED ORDER — ALUM & MAG HYDROXIDE-SIMETH 200-200-20 MG/5ML PO SUSP: 30.0000 mL | Freq: Four times a day (QID) | ORAL | Status: DC | PRN

## 2019-11-01 MED ORDER — ORAL CARE MOUTH RINSE: 15.0000 mL | Freq: Once | OROMUCOSAL | Status: AC

## 2019-11-01 MED ORDER — HEPARIN SODIUM (PORCINE) 5000 UNIT/ML IJ SOLN
5000.0000 [IU] | Freq: Once | INTRAMUSCULAR | Status: AC
  Administered 2019-11-01: 5000 [IU] via SUBCUTANEOUS
  Filled 2019-11-01: qty 1

## 2019-11-01 MED ORDER — SODIUM CHLORIDE 0.9 % IR SOLN: Status: DC | PRN

## 2019-11-01 MED ORDER — LIDOCAINE 20MG/ML (2%) 15 ML SYRINGE OPTIME
INTRAMUSCULAR | Status: DC | PRN
Start: 2019-11-01 — End: 2019-11-01
  Administered 2019-11-01: 1 mg/kg/h via INTRAVENOUS

## 2019-11-01 MED ORDER — CHLORHEXIDINE GLUCONATE 0.12 % MT SOLN
15.0000 mL | Freq: Once | OROMUCOSAL | Status: AC
  Administered 2019-11-01: 15 mL via OROMUCOSAL

## 2019-11-01 MED ORDER — GABAPENTIN 300 MG PO CAPS
300.0000 mg | ORAL_CAPSULE | ORAL | Status: AC
  Administered 2019-11-01: 300 mg via ORAL
  Filled 2019-11-01: qty 1

## 2019-11-01 MED ORDER — ONDANSETRON HCL 4 MG/2ML IJ SOLN
INTRAMUSCULAR | Status: DC | PRN
  Administered 2019-11-01: 4 mg via INTRAVENOUS

## 2019-11-01 MED ORDER — EPHEDRINE 5 MG/ML INJ
INTRAVENOUS | Status: AC
  Filled 2019-11-01: qty 20

## 2019-11-01 MED ORDER — PROPOFOL 10 MG/ML IV BOLUS
INTRAVENOUS | Status: DC | PRN
  Administered 2019-11-01: 100 mg via INTRAVENOUS

## 2019-11-01 MED ORDER — TRAMADOL HCL 50 MG PO TABS
50.0000 mg | ORAL_TABLET | Freq: Four times a day (QID) | ORAL | Status: DC | PRN
  Administered 2019-11-02: 50 mg via ORAL
  Filled 2019-11-01: qty 1

## 2019-11-01 MED ORDER — PHENYLEPHRINE HCL-NACL 10-0.9 MG/250ML-% IV SOLN
INTRAVENOUS | Status: DC | PRN
  Administered 2019-11-01: 25 ug/min via INTRAVENOUS

## 2019-11-01 MED ORDER — ONDANSETRON HCL 4 MG/2ML IJ SOLN
INTRAMUSCULAR | Status: AC
  Filled 2019-11-01: qty 4

## 2019-11-01 MED ORDER — SUGAMMADEX SODIUM 200 MG/2ML IV SOLN
INTRAVENOUS | Status: DC | PRN
  Administered 2019-11-01: 400 mg via INTRAVENOUS

## 2019-11-01 MED ORDER — ALBUMIN HUMAN 5 % IV SOLN
INTRAVENOUS | Status: AC
  Filled 2019-11-01: qty 500

## 2019-11-01 MED ORDER — PHENYLEPHRINE HCL (PRESSORS) 10 MG/ML IV SOLN
INTRAVENOUS | Status: AC
  Filled 2019-11-01: qty 3

## 2019-11-01 MED ORDER — ENOXAPARIN SODIUM 40 MG/0.4ML ~~LOC~~ SOLN
40.0000 mg | SUBCUTANEOUS | Status: DC
  Administered 2019-11-02 – 2019-11-05 (×4): 40 mg via SUBCUTANEOUS
  Filled 2019-11-01 (×4): qty 0.4

## 2019-11-01 MED ORDER — ALVIMOPAN 12 MG PO CAPS
12.0000 mg | ORAL_CAPSULE | ORAL | Status: AC
  Administered 2019-11-01: 12 mg via ORAL
  Filled 2019-11-01: qty 1

## 2019-11-01 MED ORDER — ANASTROZOLE 1 MG PO TABS
1.0000 mg | ORAL_TABLET | Freq: Every day | ORAL | Status: DC
  Administered 2019-11-02 – 2019-11-05 (×4): 1 mg via ORAL
  Filled 2019-11-01 (×4): qty 1

## 2019-11-01 MED ORDER — SIMETHICONE 80 MG PO CHEW: 40.0000 mg | CHEWABLE_TABLET | Freq: Four times a day (QID) | ORAL | Status: DC | PRN

## 2019-11-01 MED ORDER — KCL IN DEXTROSE-NACL 20-5-0.45 MEQ/L-%-% IV SOLN
INTRAVENOUS | Status: DC
  Filled 2019-11-01: qty 1000

## 2019-11-01 MED ORDER — ROCURONIUM BROMIDE 10 MG/ML (PF) SYRINGE
PREFILLED_SYRINGE | INTRAVENOUS | Status: AC
  Filled 2019-11-01: qty 20

## 2019-11-01 MED ORDER — BUPIVACAINE LIPOSOME 1.3 % IJ SUSP
INTRAMUSCULAR | Status: DC | PRN
  Administered 2019-11-01: 20 mL

## 2019-11-01 MED ORDER — FENTANYL CITRATE (PF) 250 MCG/5ML IJ SOLN
INTRAMUSCULAR | Status: DC | PRN
  Administered 2019-11-01 (×2): 50 ug via INTRAVENOUS
  Administered 2019-11-01: 100 ug via INTRAVENOUS

## 2019-11-01 MED ORDER — FENTANYL CITRATE (PF) 250 MCG/5ML IJ SOLN
INTRAMUSCULAR | Status: AC
  Filled 2019-11-01: qty 5

## 2019-11-01 MED ORDER — BUPIVACAINE-EPINEPHRINE 0.25% -1:200000 IJ SOLN
INTRAMUSCULAR | Status: DC | PRN
  Administered 2019-11-01: 30 mL

## 2019-11-01 MED ORDER — LIDOCAINE HCL 2 % IJ SOLN
INTRAMUSCULAR | Status: AC
  Filled 2019-11-01: qty 40

## 2019-11-01 MED ORDER — LACTATED RINGERS IV SOLN: INTRAVENOUS | Status: DC

## 2019-11-01 MED ORDER — MIDAZOLAM HCL 2 MG/2ML IJ SOLN
INTRAMUSCULAR | Status: AC
  Filled 2019-11-01: qty 2

## 2019-11-01 MED ORDER — PROPOFOL 10 MG/ML IV BOLUS
INTRAVENOUS | Status: AC
  Filled 2019-11-01: qty 20

## 2019-11-01 MED ORDER — DEXAMETHASONE SODIUM PHOSPHATE 10 MG/ML IJ SOLN
INTRAMUSCULAR | Status: DC | PRN
  Administered 2019-11-01: 4 mg via INTRAVENOUS

## 2019-11-01 MED ORDER — SUCCINYLCHOLINE CHLORIDE 200 MG/10ML IV SOSY
PREFILLED_SYRINGE | INTRAVENOUS | Status: AC
  Filled 2019-11-01: qty 10

## 2019-11-01 MED ORDER — LOSARTAN POTASSIUM 25 MG PO TABS
25.0000 mg | ORAL_TABLET | Freq: Every day | ORAL | Status: DC
  Administered 2019-11-02 – 2019-11-05 (×4): 25 mg via ORAL
  Filled 2019-11-01 (×5): qty 1

## 2019-11-01 MED ORDER — SODIUM CHLORIDE 0.9 % IV SOLN
2.0000 g | Freq: Two times a day (BID) | INTRAVENOUS | Status: AC
  Administered 2019-11-01: 2 g via INTRAVENOUS
  Filled 2019-11-01 (×2): qty 2

## 2019-11-01 MED ORDER — BUPIVACAINE-EPINEPHRINE (PF) 0.25% -1:200000 IJ SOLN
INTRAMUSCULAR | Status: AC
  Filled 2019-11-01: qty 30

## 2019-11-01 MED ORDER — ENSURE SURGERY PO LIQD
237.0000 mL | Freq: Two times a day (BID) | ORAL | Status: DC
  Administered 2019-11-03: 237 mL via ORAL
  Filled 2019-11-01 (×8): qty 237

## 2019-11-01 MED ORDER — LIDOCAINE 2% (20 MG/ML) 5 ML SYRINGE
INTRAMUSCULAR | Status: AC
  Filled 2019-11-01: qty 10

## 2019-11-01 MED ORDER — AMISULPRIDE (ANTIEMETIC) 5 MG/2ML IV SOLN
10.0000 mg | Freq: Once | INTRAVENOUS | Status: AC
  Administered 2019-11-01: 10 mg via INTRAVENOUS

## 2019-11-01 MED ORDER — ONDANSETRON HCL 4 MG PO TABS: 4.0000 mg | ORAL_TABLET | Freq: Four times a day (QID) | ORAL | Status: DC | PRN

## 2019-11-01 MED ORDER — PHENYLEPHRINE 40 MCG/ML (10ML) SYRINGE FOR IV PUSH (FOR BLOOD PRESSURE SUPPORT)
PREFILLED_SYRINGE | INTRAVENOUS | Status: AC
  Filled 2019-11-01: qty 10

## 2019-11-01 MED ORDER — PANTOPRAZOLE SODIUM 40 MG PO TBEC
40.0000 mg | DELAYED_RELEASE_TABLET | Freq: Every day | ORAL | Status: DC
  Administered 2019-11-02 – 2019-11-05 (×4): 40 mg via ORAL
  Filled 2019-11-01 (×4): qty 1

## 2019-11-01 MED ORDER — GABAPENTIN 300 MG PO CAPS
300.0000 mg | ORAL_CAPSULE | Freq: Two times a day (BID) | ORAL | Status: DC
  Administered 2019-11-01 – 2019-11-05 (×8): 300 mg via ORAL
  Filled 2019-11-01 (×8): qty 1

## 2019-11-01 MED ORDER — DEXAMETHASONE SODIUM PHOSPHATE 10 MG/ML IJ SOLN
INTRAMUSCULAR | Status: AC
  Filled 2019-11-01: qty 2

## 2019-11-01 MED ORDER — SACCHAROMYCES BOULARDII 250 MG PO CAPS
250.0000 mg | ORAL_CAPSULE | Freq: Two times a day (BID) | ORAL | Status: DC
  Administered 2019-11-01 – 2019-11-05 (×8): 250 mg via ORAL
  Filled 2019-11-01 (×8): qty 1

## 2019-11-01 MED ORDER — HYDROMORPHONE HCL 1 MG/ML IJ SOLN: 0.5000 mg | INTRAMUSCULAR | Status: DC | PRN

## 2019-11-01 MED ORDER — SODIUM CHLORIDE 0.9 % IV SOLN
2.0000 g | INTRAVENOUS | Status: AC
  Administered 2019-11-01: 2 g via INTRAVENOUS
  Filled 2019-11-01: qty 2

## 2019-11-01 MED ORDER — METFORMIN HCL 500 MG PO TABS
500.0000 mg | ORAL_TABLET | Freq: Two times a day (BID) | ORAL | Status: DC
  Administered 2019-11-02: 500 mg via ORAL
  Filled 2019-11-01: qty 1

## 2019-11-01 MED ORDER — LIDOCAINE 2% (20 MG/ML) 5 ML SYRINGE
INTRAMUSCULAR | Status: DC | PRN
  Administered 2019-11-01: 60 mg via INTRAVENOUS

## 2019-11-01 MED ORDER — ONDANSETRON HCL 4 MG/2ML IJ SOLN
4.0000 mg | Freq: Four times a day (QID) | INTRAMUSCULAR | Status: DC | PRN
  Administered 2019-11-01: 4 mg via INTRAVENOUS
  Filled 2019-11-01: qty 2

## 2019-11-01 MED ORDER — MIDAZOLAM HCL 5 MG/5ML IJ SOLN
INTRAMUSCULAR | Status: DC | PRN
  Administered 2019-11-01: 2 mg via INTRAVENOUS

## 2019-11-01 MED ORDER — SUGAMMADEX SODIUM 500 MG/5ML IV SOLN
INTRAVENOUS | Status: AC
  Filled 2019-11-01: qty 15

## 2019-11-01 SURGICAL SUPPLY — 63 items
APPLIER CLIP 5 13 M/L LIGAMAX5 (MISCELLANEOUS)
BLADE EXTENDED COATED 6.5IN (ELECTRODE) IMPLANT
CABLE HIGH FREQUENCY MONO STRZ (ELECTRODE) IMPLANT
CELLS DAT CNTRL 66122 CELL SVR (MISCELLANEOUS) IMPLANT
CHLORAPREP W/TINT 26 (MISCELLANEOUS) ×2 IMPLANT
CLIP APPLIE 5 13 M/L LIGAMAX5 (MISCELLANEOUS) IMPLANT
COVER WAND RF STERILE (DRAPES) ×2 IMPLANT
DECANTER SPIKE VIAL GLASS SM (MISCELLANEOUS) ×2 IMPLANT
DERMABOND ADVANCED (GAUZE/BANDAGES/DRESSINGS) ×1
DERMABOND ADVANCED .7 DNX12 (GAUZE/BANDAGES/DRESSINGS) ×1 IMPLANT
DRAIN CHANNEL 19F RND (DRAIN) IMPLANT
DRAPE LAPAROSCOPIC ABDOMINAL (DRAPES) ×2 IMPLANT
DRAPE SURG IRRIG POUCH 19X23 (DRAPES) ×2 IMPLANT
DRSG OPSITE POSTOP 4X10 (GAUZE/BANDAGES/DRESSINGS) IMPLANT
DRSG OPSITE POSTOP 4X6 (GAUZE/BANDAGES/DRESSINGS) ×2 IMPLANT
DRSG OPSITE POSTOP 4X8 (GAUZE/BANDAGES/DRESSINGS) IMPLANT
ELECT REM PT RETURN 15FT ADLT (MISCELLANEOUS) ×2 IMPLANT
EVACUATOR SILICONE 100CC (DRAIN) IMPLANT
GAUZE SPONGE 4X4 12PLY STRL (GAUZE/BANDAGES/DRESSINGS) IMPLANT
GLOVE BIO SURGEON STRL SZ 6.5 (GLOVE) ×4 IMPLANT
GLOVE BIOGEL PI IND STRL 7.0 (GLOVE) ×2 IMPLANT
GLOVE BIOGEL PI INDICATOR 7.0 (GLOVE) ×2
GOWN STRL REUS W/TWL XL LVL3 (GOWN DISPOSABLE) ×12 IMPLANT
GRASPER ENDOPATH ANVIL 10MM (MISCELLANEOUS) IMPLANT
HOLDER FOLEY CATH W/STRAP (MISCELLANEOUS) ×2 IMPLANT
IRRIG SUCT STRYKERFLOW 2 WTIP (MISCELLANEOUS) ×2
IRRIGATION SUCT STRKRFLW 2 WTP (MISCELLANEOUS) ×1 IMPLANT
KIT TURNOVER KIT A (KITS) IMPLANT
PACK COLON (CUSTOM PROCEDURE TRAY) ×2 IMPLANT
PAD POSITIONING PINK XL (MISCELLANEOUS) ×2 IMPLANT
PENCIL SMOKE EVACUATOR (MISCELLANEOUS) IMPLANT
PORT LAP GEL ALEXIS MED 5-9CM (MISCELLANEOUS) ×2 IMPLANT
PROTECTOR NERVE ULNAR (MISCELLANEOUS) ×2 IMPLANT
RELOAD PROXIMATE 75MM BLUE (ENDOMECHANICALS) ×4 IMPLANT
RTRCTR WOUND ALEXIS 18CM MED (MISCELLANEOUS)
SCISSORS LAP 5X35 DISP (ENDOMECHANICALS) ×2 IMPLANT
SEALER TISSUE G2 STRG ARTC 35C (ENDOMECHANICALS) ×2 IMPLANT
SET TUBE SMOKE EVAC HIGH FLOW (TUBING) ×2 IMPLANT
SLEEVE XCEL OPT CAN 5 100 (ENDOMECHANICALS) ×4 IMPLANT
SPONGE DRAIN TRACH 4X4 STRL 2S (GAUZE/BANDAGES/DRESSINGS) IMPLANT
SPONGE LAP 18X18 RF (DISPOSABLE) IMPLANT
STAPLER GUN LINEAR PROX 60 (STAPLE) ×2 IMPLANT
STAPLER PROXIMATE 75MM BLUE (STAPLE) ×2 IMPLANT
SURGILUBE 2OZ TUBE FLIPTOP (MISCELLANEOUS) ×2 IMPLANT
SUT ETHILON 2 0 PS N (SUTURE) IMPLANT
SUT NOVA NAB DX-16 0-1 5-0 T12 (SUTURE) ×4 IMPLANT
SUT PROLENE 2 0 KS (SUTURE) IMPLANT
SUT SILK 2 0 (SUTURE) ×2
SUT SILK 2 0 SH CR/8 (SUTURE) IMPLANT
SUT SILK 2-0 18XBRD TIE 12 (SUTURE) ×1 IMPLANT
SUT SILK 3 0 (SUTURE)
SUT SILK 3 0 SH CR/8 (SUTURE) ×4 IMPLANT
SUT SILK 3-0 18XBRD TIE 12 (SUTURE) IMPLANT
SUT VIC AB 2-0 SH 18 (SUTURE) ×2 IMPLANT
SUT VIC AB 4-0 PS2 27 (SUTURE) ×2 IMPLANT
SUT VICRYL 0 UR6 27IN ABS (SUTURE) ×2 IMPLANT
SYS LAPSCP GELPORT 120MM (MISCELLANEOUS)
SYSTEM LAPSCP GELPORT 120MM (MISCELLANEOUS) IMPLANT
TOWEL OR NON WOVEN STRL DISP B (DISPOSABLE) ×2 IMPLANT
TRAY FOLEY MTR SLVR 16FR STAT (SET/KITS/TRAYS/PACK) IMPLANT
TROCAR BLADELESS OPT 5 100 (ENDOMECHANICALS) ×2 IMPLANT
TROCAR XCEL BLUNT TIP 100MML (ENDOMECHANICALS) IMPLANT
TUBING CONNECTING 10 (TUBING) ×2 IMPLANT

## 2019-11-01 NOTE — Anesthesia Procedure Notes (Signed)
Procedure Name: Intubation Date/Time: 11/01/2019 2:26 PM Performed by: Mitzie Na, CRNA Pre-anesthesia Checklist: Patient identified, Emergency Drugs available, Suction available and Patient being monitored Patient Re-evaluated:Patient Re-evaluated prior to induction Oxygen Delivery Method: Circle system utilized Preoxygenation: Pre-oxygenation with 100% oxygen Induction Type: IV induction Ventilation: Oral airway inserted - appropriate to patient size and Mask ventilation without difficulty Laryngoscope Size: Mac and 3 Grade View: Grade II Tube type: Oral Tube size: 7.5 mm Number of attempts: 1 Airway Equipment and Method: Stylet and Oral airway Placement Confirmation: ETT inserted through vocal cords under direct vision,  positive ETCO2 and breath sounds checked- equal and bilateral Secured at: 24 cm Tube secured with: Tape Dental Injury: Teeth and Oropharynx as per pre-operative assessment

## 2019-11-01 NOTE — Interval H&P Note (Signed)
History and Physical Interval Note:  11/01/2019 1:47 PM  Kim Castro  has presented today for surgery, with the diagnosis of COLON CANCER.  The various methods of treatment have been discussed with the patient and family. After consideration of risks, benefits and other options for treatment, the patient has consented to  Procedure(s): LAPAROSCOPIC PARTIAL COLECTOMY (N/A) as a surgical intervention.  The patient's history has been reviewed, patient examined, no change in status, stable for surgery.  I have reviewed the patient's chart and labs.  Questions were answered to the patient's satisfaction.     Rosario Adie, MD  Colorectal [facility] Surgery

## 2019-11-01 NOTE — Anesthesia Preprocedure Evaluation (Addendum)
Anesthesia Evaluation  Patient identified by MRN, date of birth, ID band Patient awake    Reviewed: Allergy & Precautions, NPO status , Patient's Chart, lab work & pertinent test results  Airway Mallampati: II  TM Distance: >3 FB Neck ROM: Full  Mouth opening: Limited Mouth Opening  Dental no notable dental hx. (+) Teeth Intact, Dental Advisory Given   Pulmonary neg pulmonary ROS,    Pulmonary exam normal breath sounds clear to auscultation       Cardiovascular hypertension, Pt. on medications + CAD  Normal cardiovascular exam Rhythm:Regular Rate:Normal  HLD  Chest CT 2021 1. Multiple small pulmonary nodules measuring 5 mm or less in size in the lungs. These are nonspecific and are typically considered statistically likely benign. However, given the patient's history of primary malignancy, close attention on follow-up studies is recommended to ensure stability. 2. Aortic atherosclerosis, in addition to right coronary artery disease. Assessment for potential risk factor modification, dietary therapy or pharmacologic therapy may be warranted, if clinically indicated. 3. There are calcifications of the aortic valve and mitral annulus. Echocardiographic correlation for evaluation of potential valvular dysfunction may be warranted if clinically indicated. 4. Small hiatal hernia.   Neuro/Psych negative neurological ROS  negative psych ROS   GI/Hepatic Neg liver ROS, hiatal hernia, GERD  Medicated and Controlled,  Endo/Other  diabetes, Oral Hypoglycemic AgentsMorbid obesity (BMI 41)  Renal/GU negative Renal ROS  negative genitourinary   Musculoskeletal  (+) Arthritis ,   Abdominal   Peds  Hematology negative hematology ROS (+)   Anesthesia Other Findings   Reproductive/Obstetrics                            Anesthesia Physical Anesthesia Plan  ASA: III  Anesthesia Plan: General   Post-op Pain  Management:    Induction: Intravenous  PONV Risk Score and Plan: 3 and Dexamethasone, Ondansetron and Treatment may vary due to age or medical condition  Airway Management Planned: Oral ETT  Additional Equipment:   Intra-op Plan:   Post-operative Plan: Extubation in OR  Informed Consent: I have reviewed the patients History and Physical, chart, labs and discussed the procedure including the risks, benefits and alternatives for the proposed anesthesia with the patient or authorized representative who has indicated his/her understanding and acceptance.     Dental advisory given  Plan Discussed with: CRNA  Anesthesia Plan Comments:         Anesthesia Quick Evaluation

## 2019-11-01 NOTE — Op Note (Signed)
11/01/2019  3:59 PM  PATIENT:  Kim Castro  74 y.o. female  Patient Care Team: Hulan Fess, MD as PCP - General (Family Medicine) Excell Seltzer, MD (Inactive) as Consulting Physician (General Surgery) Rolm Bookbinder, MD as Consulting Physician (General Surgery) Thea Silversmith, MD as Consulting Physician (Radiation Oncology) Holley Bouche, NP (Inactive) as Nurse Practitioner (Nurse Practitioner) Truitt Merle, MD as Consulting Physician (Hematology) Jonnie Finner, RN as Oncology Nurse Navigator Leighton Ruff, MD as Consulting Physician (General Surgery) Nicholas Lose, MD as Consulting Physician (Hematology and Oncology)  PRE-OPERATIVE DIAGNOSIS:  COLON CANCER  POST-OPERATIVE DIAGNOSIS:  COLON CANCER  PROCEDURE: LAPAROSCOPIC PARTIAL COLECTOMY   Surgeon(s): Leighton Ruff, MD Michael Boston, MD  ASSISTANT: Dr Johney Maine   ANESTHESIA:   general  EBL:  Total I/O In: 1000 [I.V.:1000] Out: 100 [Blood:100]  SPECIMEN:  Source of Specimen:  R colon  DISPOSITION OF SPECIMEN:  PATHOLOGY  COUNTS:  YES  PLAN OF CARE: Admit to inpatient   PATIENT DISPOSITION:  PACU - hemodynamically stable.   INDICATIONS: This is a 74 y.o. female who presented to my office with an ascending colon mass.  Biopsies showed adenocarcinoma. The risk and benefits and alternative treatments were explained to the patient prior to the OR and the patient has elected to proceed with a laparoscopic right colectomy.  Consent was signed and placed on chart prior to the OR.   OR FINDINGS: Mass noted in the cecum with signs of mild obstruction.  Tattoo noted distally  DESCRIPTION:  The patient was identified & brought into the operating room. The patient was positioned supine with arms tucked. SCDs were active during the entire case. The patient underwent general anesthesia without any difficulty. A foley catheter was inserted under sterile conditions. The abdomen was prepped and draped in a  sterile fashion. A Surgical Timeout confirmed our plan.  I made an incision around the umbilical fold. I dissected down through the subcutaneous tissues using cautery.  The fascia was divided with cautery.  Blunt dissection was used to obtain peritoneal entry.  An alexis wound protector was placed and the cap was placed over this.  The abdomen was insufflated to ~15 mmHg.  Camera inspection revealed no injury.  I placed additional ports under direct laparoscopic visualization.  I evaluated the entire abdomen laparoscopically.  The liver appeared normal, the large and small bowel were normal as well.  There were no signs of metastatic disease.   I began by identifying the ileocolic artery and vein within the mesentery. Dissection was bluntly carried around these structures. The duodenum was identified and free from the structures. I then separated the structures bluntly and used the Enseal device to transect these separately.  I developed the retroperitoneal plane bluntly.  I then freed the appendix off its attachments to the pelvic wall. I mobilized the terminal ileum.  I took care to avoid injuring any retroperitoneal structures.  After this I began to mobilize laterally down the white line of Toldt and then took down the hepatic flexure using the Enseal device. I mobilized the omentum off of the right transverse colon. The entire colon was then flipped medially and mobilized off of the retroperitoneal structures until I could visualize the lateral edge of the duodenum underneath.  I gently freed the duodenal attachments.  At that point, I desufflated the abdomen and removed the wound protector cap.  The terminal ileum and right colon were then removed from the wound. The terminal ileum was transected using a GIA  blue load stapler. The remaining mesentery was divided using the Enseal device. I identified a portion of the transverse colon just distal to the hepatic flexure. This was transected using another blue  load GIA stapler.  An anastomosis was created between the terminal ileum and the transverse colon. This was done using a GIA blue load stapler.  The common enterotomy channel was closed using a TA 60 blue load stapler. Hemostasis was good at the staple line. Several 3-0 silk sutures were used to imbricate the edge of the anastomosis. An anti-tension suture was placed in the crotch of the anastomosis. This was then placed back into the abdomen. The abdomen was then irrigated with normal saline. The omentum was then brought down over the anastomosis. The Alexis wound protector was removed, and we switched to clean instruments, gowns and drapes.  The fascia was then closed using #1 Novafil interrupted sutures.  The subcutaneous tissue of the extraction incision was closed using interrupted 2-0 Vicryl sutures. The skin was then closed using 4-0 Vicryl sutures. Dermabond was placed on the port sites and a sterile dressing was placed over the abdominal incision. All counts were correct per operating room staff. The patient was then awakened from anesthesia and sent to the post anesthesia care unit in stable condition.

## 2019-11-01 NOTE — Transfer of Care (Signed)
Immediate Anesthesia Transfer of Care Note  Patient: Kim Castro  Procedure(s) Performed: Procedure(s): LAPAROSCOPIC PARTIAL COLECTOMY (N/A)  Patient Location: PACU  Anesthesia Type:General  Level of Consciousness: Alert, Awake, Oriented  Airway & Oxygen Therapy: Patient Spontanous Breathing  Post-op Assessment: Report given to RN  Post vital signs: Reviewed and stable  Last Vitals:  Vitals:   11/01/19 1204  BP: (!) 172/75  Pulse: 80  Resp: 16  Temp: 36.9 C  SpO2: 37%    Complications: No apparent anesthesia complications

## 2019-11-01 NOTE — Anesthesia Postprocedure Evaluation (Signed)
Anesthesia Post Note  Patient: Kim Castro  Procedure(s) Performed: LAPAROSCOPIC PARTIAL COLECTOMY (N/A )     Patient location during evaluation: PACU Anesthesia Type: General Level of consciousness: awake and alert Pain management: pain level controlled Vital Signs Assessment: post-procedure vital signs reviewed and stable Respiratory status: spontaneous breathing, nonlabored ventilation, respiratory function stable and patient connected to nasal cannula oxygen Cardiovascular status: blood pressure returned to baseline and stable Postop Assessment: no apparent nausea or vomiting Anesthetic complications: no   No complications documented.  Last Vitals:  Vitals:   11/01/19 1715 11/01/19 1730  BP:  140/61  Pulse:  (!) 55  Resp:  15  Temp: 36.5 C   SpO2:  97%    Last Pain:  Vitals:   11/01/19 1730  TempSrc:   PainSc: Asleep                 Charl Wellen L Deangela Randleman

## 2019-11-02 ENCOUNTER — Encounter (HOSPITAL_COMMUNITY): Payer: Self-pay | Admitting: General Surgery

## 2019-11-02 LAB — BASIC METABOLIC PANEL
Anion gap: 6 (ref 5–15)
BUN: 9 mg/dL (ref 8–23)
CO2: 28 mmol/L (ref 22–32)
Calcium: 8.4 mg/dL — ABNORMAL LOW (ref 8.9–10.3)
Chloride: 106 mmol/L (ref 98–111)
Creatinine, Ser: 0.85 mg/dL (ref 0.44–1.00)
GFR calc Af Amer: >60 mL/min (ref >60)
GFR calc non Af Amer: >60 mL/min (ref >60)
Glucose, Bld: 189 mg/dL — ABNORMAL HIGH (ref 70–99)
Potassium: 4.2 mmol/L (ref 3.5–5.1)
Sodium: 140 mmol/L (ref 135–145)

## 2019-11-02 LAB — CBC
HCT: 35.3 % — ABNORMAL LOW (ref 36.0–46.0)
Hemoglobin: 10.6 g/dL — ABNORMAL LOW (ref 12.0–15.0)
MCH: 27.7 pg (ref 26.0–34.0)
MCHC: 30 g/dL (ref 30.0–36.0)
MCV: 92.2 fL (ref 80.0–100.0)
Platelets: 314 10*3/uL (ref 150–400)
RBC: 3.83 MIL/uL — ABNORMAL LOW (ref 3.87–5.11)
RDW: 16.8 % — ABNORMAL HIGH (ref 11.5–15.5)
WBC: 12.9 10*3/uL — ABNORMAL HIGH (ref 4.0–10.5)
nRBC: 0 % (ref 0.0–0.2)

## 2019-11-02 MED ORDER — METFORMIN HCL 500 MG PO TABS
500.0000 mg | ORAL_TABLET | Freq: Two times a day (BID) | ORAL | Status: DC
  Administered 2019-11-03 – 2019-11-05 (×6): 500 mg via ORAL
  Filled 2019-11-02 (×5): qty 1

## 2019-11-02 NOTE — Progress Notes (Signed)
1 Day Post-Op lap R colectomy Subjective: No acute issues.  Tolerating clears  Objective: Vital signs in last 24 hours: Temp:  [97.4 F (36.3 C)-98.5 F (36.9 C)] 97.4 F (36.3 C) (07/03 0528) Pulse Rate:  [51-80] 52 (07/03 0528) Resp:  [7-18] 15 (07/03 0528) BP: (137-172)/(57-76) 150/76 (07/03 0528) SpO2:  [96 %-99 %] 99 % (07/03 0528) Weight:  [110.2 kg] 110.2 kg (07/02 1202)   Intake/Output from previous day: 07/02 0701 - 07/03 0700 In: 3077.8 [P.O.:200; I.V.:2877.8] Out: 1410 [Urine:1310; Blood:100] Intake/Output this shift: Total I/O In: 420 [P.O.:420] Out: -    General appearance: alert and cooperative GI: normal findings: soft, non-tender  Incision: no significant drainage  Lab Results:  Recent Labs    11/02/19 0407  WBC 12.9*  HGB 10.6*  HCT 35.3*  PLT 314   BMET Recent Labs    11/02/19 0407  NA 140  K 4.2  CL 106  CO2 28  GLUCOSE 189*  BUN 9  CREATININE 0.85  CALCIUM 8.4*   PT/INR No results for input(s): LABPROT, INR in the last 72 hours. ABG No results for input(s): PHART, HCO3 in the last 72 hours.  Invalid input(s): PCO2, PO2  MEDS, Scheduled  acetaminophen  1,000 mg Oral Q6H   alvimopan  12 mg Oral BID   anastrozole  1 mg Oral Daily   enoxaparin (LOVENOX) injection  40 mg Subcutaneous Q24H   feeding supplement  237 mL Oral BID BM   gabapentin  300 mg Oral BID   losartan  25 mg Oral Daily   metFORMIN  500 mg Oral BID WC   pantoprazole  40 mg Oral Daily   saccharomyces boulardii  250 mg Oral BID    Studies/Results: No results found.  Assessment: s/p Procedure(s): LAPAROSCOPIC PARTIAL COLECTOMY Patient Active Problem List   Diagnosis Date Noted   Colon cancer ([place]) 11/01/2019   Genetic testing 07/08/2014   Family history of breast cancer    Family history of colon cancer    Family history of uterine cancer    Breast cancer of upper-outer quadrant of left female breast ([place]) 06/04/2014   Arthritis of  knee, right 01/09/2013   Osteoarthritis of left knee 10/24/2012    Expected post op course  Plan: d/c foley Advance diet  SL IVF's Ambulate   LOS: 1 day     .Rosario Adie, MD [facility], [place]    11/02/2019 9:20 AM

## 2019-11-03 LAB — BASIC METABOLIC PANEL
Anion gap: 8 (ref 5–15)
BUN: 7 mg/dL — ABNORMAL LOW (ref 8–23)
CO2: 28 mmol/L (ref 22–32)
Calcium: 8.7 mg/dL — ABNORMAL LOW (ref 8.9–10.3)
Chloride: 107 mmol/L (ref 98–111)
Creatinine, Ser: 0.76 mg/dL (ref 0.44–1.00)
GFR calc Af Amer: >60 mL/min (ref >60)
GFR calc non Af Amer: >60 mL/min (ref >60)
Glucose, Bld: 106 mg/dL — ABNORMAL HIGH (ref 70–99)
Potassium: 4.4 mmol/L (ref 3.5–5.1)
Sodium: 143 mmol/L (ref 135–145)

## 2019-11-03 LAB — CBC
HCT: 35.4 % — ABNORMAL LOW (ref 36.0–46.0)
Hemoglobin: 10.7 g/dL — ABNORMAL LOW (ref 12.0–15.0)
MCH: 28.2 pg (ref 26.0–34.0)
MCHC: 30.2 g/dL (ref 30.0–36.0)
MCV: 93.4 fL (ref 80.0–100.0)
Platelets: 312 10*3/uL (ref 150–400)
RBC: 3.79 MIL/uL — ABNORMAL LOW (ref 3.87–5.11)
RDW: 17.3 % — ABNORMAL HIGH (ref 11.5–15.5)
WBC: 10.1 10*3/uL (ref 4.0–10.5)
nRBC: 0 % (ref 0.0–0.2)

## 2019-11-03 NOTE — Progress Notes (Signed)
2 Days Post-Op lap R colectomy Subjective: No acute issues.  Tolerating clears, min flatus  Objective: Vital signs in last 24 hours: Temp:  [97.7 F (36.5 C)-98.3 F (36.8 C)] 97.7 F (36.5 C) (07/04 0516) Pulse Rate:  [53-59] 53 (07/04 0516) Resp:  [16-20] 16 (07/04 0516) BP: (117-136)/(52-80) 117/52 (07/04 0516) SpO2:  [95 %-99 %] 95 % (07/04 0516) Weight:  [110.6 kg] 110.6 kg (07/04 0645)   Intake/Output from previous day: 07/03 0701 - 07/04 0700 In: 1260 [P.O.:1260] Out: 350 [Urine:350] Intake/Output this shift: No intake/output data recorded.   General appearance: alert and cooperative GI: normal findings: soft, non-tender  Incision: no significant drainage  Lab Results:  Recent Labs    11/02/19 0407 11/03/19 0525  WBC 12.9* 10.1  HGB 10.6* 10.7*  HCT 35.3* 35.4*  PLT 314 312   BMET Recent Labs    11/02/19 0407 11/03/19 0525  NA 140 143  K 4.2 4.4  CL 106 107  CO2 28 28  GLUCOSE 189* 106*  BUN 9 7*  CREATININE 0.85 0.76  CALCIUM 8.4* 8.7*   PT/INR No results for input(s): LABPROT, INR in the last 72 hours. ABG No results for input(s): PHART, HCO3 in the last 72 hours.  Invalid input(s): PCO2, PO2  MEDS, Scheduled . acetaminophen  1,000 mg Oral Q6H  . alvimopan  12 mg Oral BID  . anastrozole  1 mg Oral Daily  . enoxaparin (LOVENOX) injection  40 mg Subcutaneous Q24H  . feeding supplement  237 mL Oral BID BM  . gabapentin  300 mg Oral BID  . losartan  25 mg Oral Daily  . metFORMIN  500 mg Oral BID WC  . pantoprazole  40 mg Oral Daily  . saccharomyces boulardii  250 mg Oral BID    Studies/Results: No results found.  Assessment: s/p Procedure(s): LAPAROSCOPIC PARTIAL COLECTOMY Patient Active Problem List   Diagnosis Date Noted  . Colon cancer ([place]) 11/01/2019  . Genetic testing 07/08/2014  . Family history of breast cancer   . Family history of colon cancer   . Family history of uterine cancer   . Breast cancer of upper-outer  quadrant of left female breast ([place]) 06/04/2014  . Arthritis of knee, right 01/09/2013  . Osteoarthritis of left knee 10/24/2012    Expected post op course  Plan: Cont fulls SL IVF's Ambulate   LOS: 2 days     .Rosario Adie, MD [facility], [place]    11/03/2019 9:05 AM

## 2019-11-04 ENCOUNTER — Other Ambulatory Visit: Payer: Self-pay

## 2019-11-04 LAB — BASIC METABOLIC PANEL
Anion gap: 8 (ref 5–15)
BUN: 7 mg/dL — ABNORMAL LOW (ref 8–23)
CO2: 25 mmol/L (ref 22–32)
Calcium: 8.6 mg/dL — ABNORMAL LOW (ref 8.9–10.3)
Chloride: 107 mmol/L (ref 98–111)
Creatinine, Ser: 0.62 mg/dL (ref 0.44–1.00)
GFR calc Af Amer: >60 mL/min (ref >60)
GFR calc non Af Amer: >60 mL/min (ref >60)
Glucose, Bld: 109 mg/dL — ABNORMAL HIGH (ref 70–99)
Potassium: 3.8 mmol/L (ref 3.5–5.1)
Sodium: 140 mmol/L (ref 135–145)

## 2019-11-04 LAB — CBC
HCT: 34.8 % — ABNORMAL LOW (ref 36.0–46.0)
Hemoglobin: 10.7 g/dL — ABNORMAL LOW (ref 12.0–15.0)
MCH: 28.2 pg (ref 26.0–34.0)
MCHC: 30.7 g/dL (ref 30.0–36.0)
MCV: 91.8 fL (ref 80.0–100.0)
Platelets: 319 10*3/uL (ref 150–400)
RBC: 3.79 MIL/uL — ABNORMAL LOW (ref 3.87–5.11)
RDW: 17.5 % — ABNORMAL HIGH (ref 11.5–15.5)
WBC: 7.2 10*3/uL (ref 4.0–10.5)
nRBC: 0 % (ref 0.0–0.2)

## 2019-11-04 NOTE — Care Management Important Message (Signed)
Important Message  Patient Details IM Letter given to [place] Case Manager to present to the Patient Name: Kim Castro MRN: [id number] Date of Birth: 1945-08-27   Medicare Important Message Given:  Yes     Kerin Salen 11/04/2019, 11:55 AM

## 2019-11-04 NOTE — Progress Notes (Signed)
3 Days Post-Op lap R colectomy Subjective: No acute issues.  Having bowel function  Objective: Vital signs in last 24 hours: Temp:  [97.6 F (36.4 C)-98.3 F (36.8 C)] 98.3 F (36.8 C) (07/05 0604) Pulse Rate:  [59-67] 67 (07/05 0604) Resp:  [16] 16 (07/05 0604) BP: (118-160)/(58-64) 160/60 (07/05 0604) SpO2:  [96 %-98 %] 96 % (07/05 0604)   Intake/Output from previous day: 07/04 0701 - 07/05 0700 In: -  Out: 1 [Stool:1] Intake/Output this shift: No intake/output data recorded.   General appearance: alert and cooperative GI: normal findings: soft, non-tender  Incision: no significant drainage  Lab Results:  Recent Labs    11/03/19 0525 11/04/19 0600  WBC 10.1 7.2  HGB 10.7* 10.7*  HCT 35.4* 34.8*  PLT 312 319   BMET Recent Labs    11/03/19 0525 11/04/19 0600  NA 143 140  K 4.4 3.8  CL 107 107  CO2 28 25  GLUCOSE 106* 109*  BUN 7* 7*  CREATININE 0.76 0.62  CALCIUM 8.7* 8.6*   PT/INR No results for input(s): LABPROT, INR in the last 72 hours. ABG No results for input(s): PHART, HCO3 in the last 72 hours.  Invalid input(s): PCO2, PO2  MEDS, Scheduled . acetaminophen  1,000 mg Oral Q6H  . alvimopan  12 mg Oral BID  . anastrozole  1 mg Oral Daily  . enoxaparin (LOVENOX) injection  40 mg Subcutaneous Q24H  . feeding supplement  237 mL Oral BID BM  . gabapentin  300 mg Oral BID  . losartan  25 mg Oral Daily  . metFORMIN  500 mg Oral BID WC  . pantoprazole  40 mg Oral Daily  . saccharomyces boulardii  250 mg Oral BID    Studies/Results: No results found.  Assessment: s/p Procedure(s): LAPAROSCOPIC PARTIAL COLECTOMY Patient Active Problem List   Diagnosis Date Noted  . Colon cancer ([place]) 11/01/2019  . Genetic testing 07/08/2014  . Family history of breast cancer   . Family history of colon cancer   . Family history of uterine cancer   . Breast cancer of upper-outer quadrant of left female breast ([place]) 06/04/2014  . Arthritis of knee, right  01/09/2013  . Osteoarthritis of left knee 10/24/2012    Expected post op course  Plan: Soft diet SL IVF's Ambulate   LOS: 3 days     .Rosario Adie, [place] [facility], [place]    11/04/2019 8:48 AM

## 2019-11-04 NOTE — Progress Notes (Signed)
Pharmacy Brief Note - Alvimopan (Entereg)  The standing order set for alvimopan (Entereg) now includes an automatic order to discontinue the drug after the patient has had a bowel movement. The change was approved by the [place] and the Medical Executive Committee.   This patient has had bowel movements documented by nursing. Therefore, alvimopan has been discontinued. If there are questions, please contact the pharmacy at [phone].   Thank you-  Minda Ditto PharmD 11/04/2019, 10:22 AM

## 2019-11-05 MED ORDER — ACETAMINOPHEN 500 MG PO TABS: 1000.0000 mg | ORAL_TABLET | Freq: Four times a day (QID) | ORAL | 0 refills | Status: DC | PRN

## 2019-11-05 MED ORDER — GABAPENTIN 300 MG PO CAPS: 300.0000 mg | ORAL_CAPSULE | Freq: Two times a day (BID) | ORAL | 1 refills | Status: DC

## 2019-11-05 NOTE — Discharge Instructions (Signed)

## 2019-11-05 NOTE — Discharge Summary (Signed)
Patient ID: Kim Castro [id number] 73 y.o. 14-Jan-1946  11/01/2019  Discharge date and time: 11/05/19   Admitting Physician: Rosario Adie  Discharge Physician: Rosario Adie  Admission Diagnoses: Colon cancer [facility]) [C18.9]  Discharge Diagnoses: Colon cancer  Operations: Procedure(s): LAPAROSCOPIC RIGHT COLECTOMY    Discharged Condition: good    Hospital Course: Pt was admitted after surgery.  Diet was advanced as tolerated.  By POD 4 she was tolerating a diet and ambulating well.  She was felt to be in stable condition for discharge to home.  Consults: None  Significant Diagnostic Studies: labs: cbc, bmet  Treatments: IV hydration and surgery: see above  Disposition: Home

## 2019-11-05 NOTE — Progress Notes (Signed)
Discharge instructions were given to patient.  All questions were answered.  Patient was taken to main entrance in wheelchair for pick up.

## 2019-11-06 ENCOUNTER — Ambulatory Visit: Payer: Medicare Other | Admitting: Hematology and Oncology

## 2019-11-08 LAB — SURGICAL PATHOLOGY

## 2019-11-11 ENCOUNTER — Inpatient Hospital Stay: Payer: Medicare Other | Admitting: Hematology and Oncology

## 2019-11-11 ENCOUNTER — Inpatient Hospital Stay: Payer: Medicare Other

## 2019-11-13 ENCOUNTER — Other Ambulatory Visit: Payer: Self-pay

## 2019-11-15 ENCOUNTER — Encounter (HOSPITAL_COMMUNITY): Payer: Self-pay

## 2019-11-25 NOTE — Progress Notes (Signed)
Kim Castro   [phone] [phone]   Clinic Follow up Note   Patient Care Team: Hulan Fess, MD as PCP - General (Family Medicine) Excell Seltzer, MD (Inactive) as Consulting Physician (General Surgery) Rolm Bookbinder, MD as Consulting Physician (General Surgery) Thea Silversmith, MD as Consulting Physician (Radiation Oncology) Holley Bouche, NP (Inactive) as Nurse Practitioner (Nurse Practitioner) Truitt Merle, MD as Consulting Physician (Hematology) Jonnie Finner, RN as Oncology Nurse Navigator Leighton Ruff, MD as Consulting Physician (General Surgery) Nicholas Lose, MD as Consulting Physician (Hematology and Oncology)  Date of Service:  11/29/2019  CHIEF COMPLAINT: F/u of colon cancer   SUMMARY OF ONCOLOGIC HISTORY: Oncology History Overview Note  Cancer Staging Breast cancer of upper-outer quadrant of left female breast [facility]) Staging form: Breast, AJCC 7th Edition - Clinical stage from 06/11/2014: Stage 0 (Tis (DCIS), N0, M0) - Unsigned - Pathologic stage from 07/03/2014: Stage Unknown (Tis (DCIS), NX, cM0) - Signed by Enid Cutter, MD on 07/10/2014 Staging comments: Staged on final lumpectomy specimen by Dr. Donato Heinz.    Breast cancer of upper-outer quadrant of left female breast ([place])  05/29/2014 Initial Biopsy   Left breast needle core biopsy: Grade 2, DCIS with calcs. ER+ (100%), PR+ (96%).    06/04/2014 Initial Diagnosis   Left breast DCIS with calcifications, ER 100%, PR 96%   06/10/2014 Breast MRI   Left breast: 2.4 x 1.3 x 1.1 cm area of patchy non-mass enhancement upper outer quadrant includes postbiopsy seroma; Right breast: 1.2 cm previously biopsied stable benign fibroadenoma   06/12/2014 Procedure   Genetic counseling/testing: Identified 1 VUS on CHEK2 gene. Remainder of 17 gene panel tested negative and included: ATM, BARD1, BRCA 1/2, BRIP1, CDH1, CHEK2, EPCAM, MLH1, MSH2, MSH6, NBN, NF1, PALB2, PTEN, RAD50, RAD51C,  RAD51D, STK11, and TP53.    07/01/2014 Surgery   Left breast lumpectomy ([place]): Grade 1, DCIS, spanning 2.3 cm, 1 mm margin, ER 100%, PR 96%   07/31/2014 - 08/28/2014 Radiation Therapy   Adjuvant RT completed Pablo Ledger). Left breast: Total dose 42.5 Gy over 17 fractions. Left breast boost: Total dose 7.5 Gy over 3 fractions.    09/14/2014 -  Anti-estrogen oral therapy   Anastrazole '1mg'$  daily. Planned duration of treatment: 5 years Lindi Adie)   09/25/2014 Survivorship   Survivorship Care Plan given to patient and reviewed with her in person.    right colon cancer  09/19/2019 Imaging   CT AP W contrast 09/19/19  IMPRESSION Fullness in the cecum, cannot exclude a mass. No evidence for metastatic disease is identified.    09/24/2019 Procedure   Colonoscopy by Dr Eber Jones 09/24/19 IMPRESSION 1. The colon was redundant  2. Mild diverticulosis was noted through the entire examined colon 3. Single 61m polyp was found in the ascending colon; polypectomy was performed using snare cautery and biopsy forceps 4. Mild diverticulosis was notes in the descending colon and sigmoid colon.  5. Single polyp was found in the sigmoid colon, polypectomy was performed with cold forceps.  6. Single polyp was found in the rectosigmoid colon; polypectomy was performed with cold snare  7. Small internal hemorrhoids  8. Large mass was found at the cecum; multiple biopsies of the area were performed using cold forceps; injection (tattooing) was performed distal to the mass.    09/24/2019 Initial Biopsy   INTERPRETATION AND DIAGNOSIS:  A. Cecum, biopsy:  Invasive moderately differentiated adenocarcinoma.  see comment  B. Polyp @ ascending colon, polypectomy:  Tubular Adenoma  C. Polyp @  sigmoid colon Polypectomy:  hyperplastic polyp.  D. Polyp @ rectosigmoid colon, Polypectomy:  Hyperplastic Polyp      10/16/2019 Imaging   CT Chest IMPRESSION: 1. Multiple small pulmonary nodules measuring 5 mm or less in  size in the lungs. These are nonspecific and are typically considered statistically likely benign. However, given the patient's history of primary malignancy, close attention on follow-up studies is recommended to ensure stability. 2. Aortic atherosclerosis, in addition to right coronary artery disease. Assessment for potential risk factor modification, dietary therapy or pharmacologic therapy may be warranted, if clinically indicated. 3. There are calcifications of the aortic valve and mitral annulus. Echocardiographic correlation for evaluation of potential valvular dysfunction may be warranted if clinically indicated. 4. Small hiatal hernia.   Aortic Atherosclerosis (ICD10-I70.0).   11/01/2019 Initial Diagnosis   Colon cancer ([place])   11/01/2019 Surgery   LAPAROSCOPIC PARTIAL COLECTOMY by Dr Marcello Moores and Dr Johney Maine   11/01/2019 Pathology Results   FINAL MICROSCOPIC DIAGNOSIS:   A. COLON, PROXIMAL RIGHT, COLECTOMY:  - Invasive colonic adenocarcinoma, 5 cm.  - Tumor invades through the muscularis propria into pericolonic tissues.   - Margins of resection are not involved.  - Metastatic carcinoma in (5) of (13) lymph nodes.  - See oncology table.    MSI Stable  Mismatch repair normal  MLH1 - Preserved nuclear expression (greater 50% tumor expression) MSH2 - Preserved nuclear expression (greater 50% tumor expression) MSH6 - Preserved nuclear expression (greater 50% tumor expression) PMS2 - Preserved nuclear expression (greater 50% tumor expression)   11/01/2019 Cancer Staging   Staging form: Colon and Rectum, AJCC 8th Edition - Pathologic stage from 11/01/2019: Stage IIIB (pT3, pN2a, cM0) - Signed by Truitt Merle, MD on 11/29/2019      CURRENT THERAPY:  PENDING FOLFOX q2weeks starting in 2 weeks   INTERVAL HISTORY:  Kim Castro is here for a follow up. She presents to the clinic alone. She notes she is recovering from surgery well. She was able to feel normal after 1 week. She  notes she has 2 soft BM a day. She notes she has had her COVID19 vaccines. She notes baseline neuropathy from her DM. She notes she was suppose to stop her Anastrozole in June but since her colon cancer her appointments were moved to later in 2021.    REVIEW OF SYSTEMS:   Constitutional: Denies fevers, chills or abnormal weight loss Eyes: Denies blurriness of vision Ears, nose, mouth, throat, and face: Denies mucositis or sore throat Respiratory: Denies cough, dyspnea or wheezes Cardiovascular: Denies palpitation, chest discomfort or lower extremity swelling Gastrointestinal:  Denies nausea, heartburn or change in bowel habits Skin: Denies abnormal skin rashes Lymphatics: Denies new lymphadenopathy or easy bruising Neurological: (+) Baseline neuropathy.  Behavioral/Psych: Mood is stable, no new changes  All other systems were reviewed with the patient and are negative.  MEDICAL HISTORY:  Past Medical History:  Diagnosis Date  . Aortic atherosclerosis ([place])   . Arthritis   . Basal cell carcinoma    arm  . Breast cancer of upper-outer quadrant of left female breast ([place]) 06/04/2014  . Cataract    immature on the left  . Colon cancer ([place])   . Diabetes mellitus without complication ([place])   . Diverticulosis   . Dizziness    > 55yr ago;took Antivert   . Family history of anesthesia complication    sister slow to wake up with anesthesia  . Family history of breast cancer   . Family history  of colon cancer   . Family history of uterine cancer   . GERD (gastroesophageal reflux disease)    takes occasional TUMs  . History of bronchitis    > 78yr ago  . History of colon polyps   . History of hiatal hernia    Small noted on CT  . Hypertension    takes Losartan daily and HCTZ  . Iron deficiency anemia   . Joint pain   . Numbness    to toes on each foot  . Peripheral neuropathy    feet and toes  . Personal history of radiation therapy   . Pulmonary nodules    Noted on CT  .  Radiation 07/31/14-08/28/14   Left Breast 20 fxs  . Seasonal allergies    takes Claritin prn  . Urinary frequency   . Vitamin D deficiency    takes VIt D daily    SURGICAL HISTORY: Past Surgical History:  Procedure Laterality Date  . BREAST BIOPSY Bilateral   . BREAST LUMPECTOMY Left   . BREAST LUMPECTOMY WITH RADIOACTIVE SEED LOCALIZATION Left 07/01/2014   Procedure: LEFT BREAST LUMPECTOMY WITH RADIOACTIVE SEED LOCALIZATION;  Surgeon: BExcell Seltzer MD;  [facility]  Service: General;  Laterality: Left;  . CATARACT EXTRACTION Right   . COLONOSCOPY    . LAPAROSCOPIC PARTIAL COLECTOMY N/A 11/01/2019   Procedure: LAPAROSCOPIC PARTIAL COLECTOMY;  Surgeon: TLeighton Ruff MD;  Location: [facility];  Service: General;  Laterality: N/A;  . TOTAL KNEE ARTHROPLASTY Left 10/24/2012   Procedure: TOTAL KNEE ARTHROPLASTY;  Surgeon: FKerin Salen MD;  [place] [facility]  Service: Orthopedics;  Laterality: Left;  . TOTAL KNEE ARTHROPLASTY Right 01/09/2013   Procedure: TOTAL KNEE ARTHROPLASTY;  Surgeon: FKerin Salen MD;  [facility] [facility]  Service: Orthopedics;  Laterality: Right;  . TUBAL LIGATION      I have reviewed the social history and family history with the patient and they are unchanged from previous note.  ALLERGIES:  is allergic to oxycodone.  MEDICATIONS:  Current Outpatient Medications  Medication Sig Dispense Refill  . aspirin EC 81 MG tablet Take 81 mg by mouth daily. Swallow whole.    . Cholecalciferol (VITAMIN D) 2000 UNITS CAPS Take 2,000 Units by mouth daily.     . Ferrous Sulfate (SLOW FE PO) Take 1 tablet by mouth in the morning and at bedtime. Morning & late afternoon    . gabapentin (NEURONTIN) 300 MG capsule Take 1 capsule (300 mg total) by mouth 2 (two) times daily. 30 capsule 1  . ibuprofen (ADVIL) 200 MG tablet Take 400 mg by mouth 2 (two) times daily as needed (pain.).    .Marland Kitchenloratadine (CLARITIN) 10 MG tablet Take 10 mg by mouth daily as needed for  allergies.    .Marland Kitchenlosartan (COZAAR) 25 MG tablet Take 25 mg by mouth daily.    . metFORMIN (GLUCOPHAGE) 500 MG tablet Take 1 tablet (500 mg total) by mouth daily with breakfast. (Patient taking differently: Take 500 mg by mouth in the morning and at bedtime. )    . Multiple Vitamins-Minerals (MULTIVITAMIN WITH MINERALS) tablet Take 1 tablet by mouth daily.    .Marland Kitchenomeprazole (PRILOSEC) 20 MG capsule Take 20 mg by mouth daily before breakfast.     . pravastatin (PRAVACHOL) 10 MG tablet Take 10 mg by mouth daily.    .Marland Kitchenacetaminophen (TYLENOL) 500 MG tablet Take 2 tablets (1,000 mg total) by mouth every 6 (six) hours as needed. 3[place]  tablet 0  . anastrozole (ARIMIDEX) 1 MG tablet Take 1 tablet (1 mg total) by mouth daily. 30 tablet 1  . metroNIDAZOLE (FLAGYL) 500 MG tablet Take 1,000 mg by mouth in the morning, at noon, and at bedtime.     Marland Kitchen neomycin (MYCIFRADIN) 500 MG tablet Take 1,000 mg by mouth 3 (three) times daily.     No current facility-administered medications for this visit.    PHYSICAL EXAMINATION: ECOG PERFORMANCE STATUS: 0 - Asymptomatic  Vitals:   11/29/19 0952  BP: (!) 157/69  Pulse: 83  Resp: 20  Temp: 98.4 F (36.9 C)  SpO2: 96%   Filed Weights   11/29/19 0952  Weight: (!) 245 lb 9.6 oz (111.4 kg)    GENERAL:alert, no distress and comfortable SKIN: skin color, texture, turgor are normal, no rashes or significant lesions EYES: normal, Conjunctiva are pink and non-injected, sclera clear  NECK: supple, thyroid normal size, non-tender, without nodularity LYMPH:  no palpable lymphadenopathy in the cervical, axillary  LUNGS: clear to auscultation and percussion with normal breathing effort HEART: regular rate & rhythm and no murmurs and no lower extremity edema ABDOMEN:abdomen soft, non-tender and normal bowel sounds (+) Surgical incision healed well Musculoskeletal:no cyanosis of digits and no clubbing  NEURO: alert & oriented x 3 with fluent speech, no focal motor/sensory  deficits  LABORATORY DATA:  I have reviewed the data as listed CBC Latest Ref Rng & Units 11/04/2019 11/03/2019 11/02/2019  WBC 4.0 - 10.5 K/uL 7.2 10.1 12.9(H)  Hemoglobin 12.0 - 15.0 g/dL 10.7(L) 10.7(L) 10.6(L)  Hematocrit 36 - 46 % 34.8(L) 35.4(L) 35.3(L)  Platelets 150 - 400 K/uL 319 312 314     CMP Latest Ref Rng & Units 11/04/2019 11/03/2019 11/02/2019  Glucose 70 - 99 mg/dL 109(H) 106(H) 189(H)  BUN 8 - 23 mg/dL 7(L) 7(L) 9  Creatinine 0.44 - 1.00 mg/dL 0.62 0.76 0.85  Sodium 135 - 145 mmol/L 140 143 140  Potassium 3.5 - 5.1 mmol/L 3.8 4.4 4.2  Chloride 98 - 111 mmol/L 107 107 106  CO2 22 - 32 mmol/L '25 28 28  '$ Calcium 8.9 - 10.3 mg/dL 8.6(L) 8.7(L) 8.4(L)  Total Protein 6.5 - 8.1 g/dL - - -  Total Bilirubin 0.3 - 1.2 mg/dL - - -  Alkaline Phos 38 - 126 U/L - - -  AST 15 - 41 U/L - - -  ALT 0 - 44 U/L - - -      RADIOGRAPHIC STUDIES: I have personally reviewed the radiological images as listed and agreed with the findings in the report. No results found.   ASSESSMENT & PLAN:  Kim Castro is a 74 y.o. female with    1. Right colon cancer, pT3N2aM0 stage IIB, MSS -She was diagnosed in 08/2019 with cecal mass biopsy showed moderately differentiated adenocarcinoma. Initial CT AP showed no evidence of lymph node or distant metastasis, or radiographic concern for bowel obstruction.  -CT chest from 10/16/19 did shows multiple small lung nodules that were nonspecific but likely benign. Will monitor.  -She underwent colon surgery with Dr Marcello Moores on 11/01/19. We discussed her path which showed 5cm of invasive colonic adenocarcinoma that was completely resected, clear margins with 5/13 LNs. Overall Stage IIIB cancer.  -I discussed with this many positive LNs and stage IIIB cancer she has very high risk of recurrence. I recommend adjuvant chemotherapy for 6 months to reduce her risk of recurrence. I discussed chemo option of CAPOX q3weeks with oral Xeloda or IV FOLFOX  q2 weeks. I gave her  print out of medications. Due to over all better tolerance, I recommend her to do FOLFOX  --Chemotherapy consent: Side effects including but does not limited to, fatigue, nausea, vomiting, diarrhea, hair loss, neuropathy, fluid retention, renal and kidney dysfunction, neutropenic fever, needed for blood transfusion, bleeding, were discussed with patient in great detail. She agrees to proceed. -the goal of chemo is curative  -Will proceed with PAC placement next week with chemo education class before start of treatment.  -Plan to start chemo with FOLFOX in 2 weeks. Given her baseline neuropathy, If we need to stop Oxaliplatin early on, I can switch her 5FU to Oral Xeloda single agent. She is agreeable.  -I discussed proceeding with CT scans every 6 months for the fors 2-3 years and after chemo she will proceed with 5 years cancer surveillance plan. Will monitor her prior lung nodules on scan.  -F/u in 2 weeks   2. Iron deficient anemia -Her 10/03/19 Iron panel showed, Ferritin 13, Iron 34, sat ratios 8. Overall consistent with iron deficiency.  -She is not on oral iron, and has not received IV iron.  -She did require blood transfusion on 11/06/19 after colon surgery.  -Will monitor on chemotherapy.   3. H/o left breast DCIS, G2, ER/PR+ -Dx in 05/2014. Treated with left lumpectomy with Dr Excell Seltzer, adjuvant RT with Dr Pablo Ledger. She has been on Anastrozole since 08/2014 for 5 year therapy. He has been followed by Dr Lindi Adie.  -Bone density 06/09/2015 T score +1.1 normal. Plan to repeat DEXA in 2021. -She notes she was to complete Anastrozole in 10/2019, but her appointments were postponed given her recent colon cancer and was told to continue until her next appointment.  -I discussed given her colon cancer surveillance, I can takeover her breast cancer surveillance as well.   4. Comorbidities: Arthritis, DM, HTN, GERD  -f/u with PCP Dr. Rex Kras    PLAN:  -I called in antiemetics and Emla cream  today  -PAC placement in 1-2 weeks  -Chemo education class next week  -Lab, F/u and chemo FOLFOX in 2 weeks  -Copy note to Dr Marcello Moores for [facility] placement in 1-2 weeks    No problem-specific Assessment & Plan notes found for this encounter.   No orders of the defined types were placed in this encounter.  All questions were answered. The patient knows to call the clinic with any problems, questions or concerns. No barriers to learning was detected. The total time spent in the appointment was 40 minutes.     Truitt Merle, MD 11/29/2019   I, Joslyn Devon, am acting as scribe for Truitt Merle, MD.   I have reviewed the above documentation for accuracy and completeness, and I agree with the above.

## 2019-11-29 ENCOUNTER — Encounter: Payer: Self-pay | Admitting: Hematology

## 2019-11-29 ENCOUNTER — Other Ambulatory Visit: Payer: Self-pay

## 2019-11-29 ENCOUNTER — Inpatient Hospital Stay: Payer: Medicare Other | Attending: Hematology | Admitting: Hematology

## 2019-11-29 VITALS — BP 157/69 | HR 83 | Temp 98.4°F | Resp 20 | Wt 245.6 lb

## 2019-11-29 DIAGNOSIS — I10 Essential (primary) hypertension: Secondary | ICD-10-CM | POA: Insufficient documentation

## 2019-11-29 DIAGNOSIS — R918 Other nonspecific abnormal finding of lung field: Secondary | ICD-10-CM | POA: Diagnosis not present

## 2019-11-29 DIAGNOSIS — C50412 Malignant neoplasm of upper-outer quadrant of left female breast: Secondary | ICD-10-CM | POA: Diagnosis not present

## 2019-11-29 DIAGNOSIS — E1140 Type 2 diabetes mellitus with diabetic neuropathy, unspecified: Secondary | ICD-10-CM | POA: Insufficient documentation

## 2019-11-29 DIAGNOSIS — K219 Gastro-esophageal reflux disease without esophagitis: Secondary | ICD-10-CM | POA: Insufficient documentation

## 2019-11-29 DIAGNOSIS — C182 Malignant neoplasm of ascending colon: Secondary | ICD-10-CM

## 2019-11-29 DIAGNOSIS — D509 Iron deficiency anemia, unspecified: Secondary | ICD-10-CM | POA: Diagnosis not present

## 2019-11-29 DIAGNOSIS — Z86000 Personal history of in-situ neoplasm of breast: Secondary | ICD-10-CM | POA: Diagnosis not present

## 2019-11-29 DIAGNOSIS — C180 Malignant neoplasm of cecum: Secondary | ICD-10-CM | POA: Diagnosis present

## 2019-11-29 DIAGNOSIS — Z170 Estrogen receptor positive status [ER+]: Secondary | ICD-10-CM | POA: Diagnosis not present

## 2019-11-30 ENCOUNTER — Encounter: Payer: Self-pay | Admitting: Hematology

## 2019-11-30 MED ORDER — ONDANSETRON HCL 8 MG PO TABS: 8.0000 mg | ORAL_TABLET | Freq: Two times a day (BID) | ORAL | 1 refills | Status: DC | PRN

## 2019-11-30 MED ORDER — LIDOCAINE-PRILOCAINE 2.5-2.5 % EX CREA: TOPICAL_CREAM | CUTANEOUS | 3 refills | Status: DC

## 2019-11-30 MED ORDER — PROCHLORPERAZINE MALEATE 10 MG PO TABS: 10.0000 mg | ORAL_TABLET | Freq: Four times a day (QID) | ORAL | 1 refills | Status: DC | PRN

## 2019-11-30 NOTE — Progress Notes (Signed)
START ON PATHWAY REGIMEN - Colorectal     A cycle is every 14 days:     Oxaliplatin      Leucovorin      Fluorouracil      Fluorouracil   **Always confirm dose/schedule in your pharmacy ordering system**  Patient Characteristics: Postoperative without Neoadjuvant Therapy (Pathologic Staging), Colon, Stage III, High Risk (pT4 or pN2) Tumor Location: Colon Therapeutic Status: Postoperative without Neoadjuvant Therapy (Pathologic Staging) AJCC M Category: cM0 AJCC T Category: pT3 AJCC N Category: pN2a AJCC 8 Stage Grouping: IIIB Intent of Therapy: Curative Intent, Discussed with Patient

## 2019-12-02 ENCOUNTER — Telehealth: Payer: Self-pay | Admitting: Hematology

## 2019-12-02 NOTE — Telephone Encounter (Signed)
Scheduled per 7/30 los. Pt is aware of appt time and date.

## 2019-12-04 ENCOUNTER — Other Ambulatory Visit: Payer: Self-pay

## 2019-12-04 ENCOUNTER — Inpatient Hospital Stay: Payer: Medicare Other | Attending: Hematology

## 2019-12-04 DIAGNOSIS — Z7984 Long term (current) use of oral hypoglycemic drugs: Secondary | ICD-10-CM | POA: Insufficient documentation

## 2019-12-04 DIAGNOSIS — D509 Iron deficiency anemia, unspecified: Secondary | ICD-10-CM | POA: Insufficient documentation

## 2019-12-04 DIAGNOSIS — C50412 Malignant neoplasm of upper-outer quadrant of left female breast: Secondary | ICD-10-CM | POA: Insufficient documentation

## 2019-12-04 DIAGNOSIS — Z79811 Long term (current) use of aromatase inhibitors: Secondary | ICD-10-CM | POA: Insufficient documentation

## 2019-12-04 DIAGNOSIS — K219 Gastro-esophageal reflux disease without esophagitis: Secondary | ICD-10-CM | POA: Insufficient documentation

## 2019-12-04 DIAGNOSIS — E1140 Type 2 diabetes mellitus with diabetic neuropathy, unspecified: Secondary | ICD-10-CM | POA: Insufficient documentation

## 2019-12-04 DIAGNOSIS — I10 Essential (primary) hypertension: Secondary | ICD-10-CM | POA: Insufficient documentation

## 2019-12-04 DIAGNOSIS — Z170 Estrogen receptor positive status [ER+]: Secondary | ICD-10-CM | POA: Insufficient documentation

## 2019-12-04 DIAGNOSIS — Z79899 Other long term (current) drug therapy: Secondary | ICD-10-CM | POA: Insufficient documentation

## 2019-12-04 DIAGNOSIS — I700 Atherosclerosis of aorta: Secondary | ICD-10-CM | POA: Insufficient documentation

## 2019-12-04 DIAGNOSIS — C182 Malignant neoplasm of ascending colon: Secondary | ICD-10-CM | POA: Insufficient documentation

## 2019-12-04 DIAGNOSIS — Z5111 Encounter for antineoplastic chemotherapy: Secondary | ICD-10-CM | POA: Insufficient documentation

## 2019-12-04 DIAGNOSIS — Z923 Personal history of irradiation: Secondary | ICD-10-CM | POA: Insufficient documentation

## 2019-12-04 DIAGNOSIS — R918 Other nonspecific abnormal finding of lung field: Secondary | ICD-10-CM | POA: Insufficient documentation

## 2019-12-04 DIAGNOSIS — M1990 Unspecified osteoarthritis, unspecified site: Secondary | ICD-10-CM | POA: Insufficient documentation

## 2019-12-04 DIAGNOSIS — Z85828 Personal history of other malignant neoplasm of skin: Secondary | ICD-10-CM | POA: Insufficient documentation

## 2019-12-04 NOTE — Patient Instructions (Addendum)
DUE TO COVID-19 ONLY ONE VISITOR IS ALLOWED TO COME WITH YOU AND STAY IN THE WAITING ROOM ONLY DURING PRE OP AND PROCEDURE DAY OF SURGERY. THE 2 VISITORS MAY VISIT WITH YOU AFTER SURGERY IN YOUR PRIVATE ROOM DURING VISITING HOURS ONLY!  YOU NEED TO HAVE A COVID 19 TEST ON   8/6__ @_8 :30______, THIS TEST MUST BE DONE BEFORE SURGERY, , COVID TESTING SITE [address] [place] [address], IT IS ON THE RIGHT GOING OUT [address] APPROXITAMELTELY 2 MINUTES PAST [employer] ON THE RIGHT. ONCE YOUR COVID TEST IS COMPLETED,  PLEASE BEGIN THE QUARANTINE INSTRUCTIONS AS OUTLINED IN YOUR HANDOUT.                Kim Castro   Your procedure is scheduled on: 12/10/19   Report to [facility] Main  Entrance   Report to admitting at   8:00 AM     Call this number if you have problems the morning of surgery [phone]    Remember: Do not eat food or drink liquids :After Midnight  . BRUSH YOUR TEETH MORNING OF SURGERY AND RINSE YOUR MOUTH OUT, NO CHEWING GUM CANDY OR MINTS.     Take these medicines the morning of surgery with A SIP OF WATER: Gabapentin, Anastrozole, Claritin, Omeprizole  DO NOT TAKE ANY DIABETIC MEDICATIONS DAY OF YOUR SURGERY       Do not take oral diabetes medicines (pills) the morning of surgery.                You may not have any metal on your body including hair pins and              piercings  Do not wear jewelry, make-up, lotions, powders or perfumes, deodorant             Do not wear nail polish on your fingernails.             Do not shave  48 hours prior to surgery.     Do not bring valuables to the hospital. [place].  Contacts, dentures or bridgework may not be worn into surgery.      Patients discharged the day of surgery will not be allowed to drive home   IF YOU ARE HAVING SURGERY AND GOING HOME THE SAME DAY, YOU MUST HAVE AN ADULT TO DRIVE YOU HOME AND BE WITH YOU  FOR 24 HOURS.   YOU MAY GO HOME BY TAXI OR UBER OR ORTHERWISE, BUT AN ADULT MUST ACCOMPANY YOU HOME AND STAY WITH YOU FOR 24 HOURS.  Name and phone number of your driver:  Special Instructions: N/A              Please read over the following fact sheets you were given: _____________________________________________________________________             [facility] - Preparing for Surgery Before surgery, you can play an important role.  Because skin is not sterile, your skin needs to be as free of germs as possible.  You can reduce the number of germs on your skin by washing with CHG (chlorahexidine gluconate) soap before surgery.  CHG is an antiseptic cleaner which kills germs and bonds with the skin to continue killing germs even after washing. Please DO NOT use if you have an allergy to CHG or antibacterial soaps.  If your skin becomes reddened/irritated stop using the CHG and inform your nurse when you arrive at Short Stay. Do not shave (including legs and underarms) for at least 48 hours prior to the first CHG shower.  You may shave your face/neck. Please follow these instructions carefully:  1.  Shower with CHG Soap the night before surgery and the  morning of Surgery.  2.  If you choose to wash your hair, wash your hair first as usual with your  normal  shampoo.  3.  After you shampoo, rinse your hair and body thoroughly to remove the  shampoo.                                       4.  Use CHG as you would any other liquid soap.  You can apply chg directly  to the skin and wash                       Gently with a scrungie or clean washcloth.  5.  Apply the CHG Soap to your body ONLY FROM THE NECK DOWN.   Do not use on face/ open                           Wound or open sores. Avoid contact with eyes, ears mouth and genitals (private parts).                       Wash face,  Genitals (private parts) with your normal soap.             6.  Wash thoroughly, paying special attention to the area  where your surgery  will be performed.  7.  Thoroughly rinse your body with warm water from the neck down.  8.  DO NOT shower/wash with your normal soap after using and rinsing off  the CHG Soap.                9.  Pat yourself dry with a clean towel.            10.  Wear clean pajamas.            11.  Place clean sheets on your bed the night of your first shower and do not  sleep with pets. Day of Surgery : Do not apply any lotions/deodorants the morning of surgery.  Please wear clean clothes to the hospital/surgery center.  FAILURE TO FOLLOW THESE INSTRUCTIONS MAY RESULT IN THE CANCELLATION OF YOUR SURGERY PATIENT SIGNATURE_________________________________  NURSE SIGNATURE__________________________________  ________________________________________________________________________

## 2019-12-06 ENCOUNTER — Other Ambulatory Visit (HOSPITAL_COMMUNITY)
Admission: RE | Admit: 2019-12-06 | Discharge: 2019-12-06 | Disposition: A | Discharge status: Home / Self Care | Payer: Medicare Other | Source: Ambulatory Visit | Attending: General Surgery | Admitting: General Surgery

## 2019-12-06 ENCOUNTER — Other Ambulatory Visit: Payer: Self-pay

## 2019-12-06 ENCOUNTER — Encounter (HOSPITAL_COMMUNITY)
Admission: RE | Admit: 2019-12-06 | Discharge: 2019-12-06 | Disposition: A | Discharge status: Home / Self Care | Payer: Medicare Other | Source: Ambulatory Visit | Attending: General Surgery | Admitting: General Surgery

## 2019-12-06 ENCOUNTER — Encounter (HOSPITAL_COMMUNITY): Payer: Self-pay

## 2019-12-06 DIAGNOSIS — Z20822 Contact with and (suspected) exposure to covid-19: Secondary | ICD-10-CM | POA: Diagnosis not present

## 2019-12-06 DIAGNOSIS — Z01812 Encounter for preprocedural laboratory examination: Secondary | ICD-10-CM | POA: Insufficient documentation

## 2019-12-06 LAB — BASIC METABOLIC PANEL
Anion gap: 10 (ref 5–15)
BUN: 20 mg/dL (ref 8–23)
CO2: 26 mmol/L (ref 22–32)
Calcium: 9.3 mg/dL (ref 8.9–10.3)
Chloride: 103 mmol/L (ref 98–111)
Creatinine, Ser: 0.68 mg/dL (ref 0.44–1.00)
GFR calc Af Amer: >60 mL/min (ref >60)
GFR calc non Af Amer: >60 mL/min (ref >60)
Glucose, Bld: 126 mg/dL — ABNORMAL HIGH (ref 70–99)
Potassium: 4.3 mmol/L (ref 3.5–5.1)
Sodium: 139 mmol/L (ref 135–145)

## 2019-12-06 LAB — CBC
HCT: 38.9 % (ref 36.0–46.0)
Hemoglobin: 12.4 g/dL (ref 12.0–15.0)
MCH: 29 pg (ref 26.0–34.0)
MCHC: 31.9 g/dL (ref 30.0–36.0)
MCV: 90.9 fL (ref 80.0–100.0)
Platelets: 260 10*3/uL (ref 150–400)
RBC: 4.28 MIL/uL (ref 3.87–5.11)
RDW: 17.5 % — ABNORMAL HIGH (ref 11.5–15.5)
WBC: 8 10*3/uL (ref 4.0–10.5)
nRBC: 0 % (ref 0.0–0.2)

## 2019-12-06 LAB — SARS CORONAVIRUS 2 (TAT 6-24 HRS): SARS Coronavirus 2: NEGATIVE

## 2019-12-06 NOTE — Progress Notes (Signed)
COVID Vaccine Completed:Yes Date COVID Vaccine completed:07/16/19 COVID vaccine manufacturer: Pfizer     PCP - Dr. Lawernce Pitts Cardiologist - no  Chest x-ray - 10/16/19 EKG - 10/23/19 Stress Test - no ECHO -no  Cardiac Cath - no  Sleep Study - no CPAP -   Fasting Blood Sugar - Pt doesn't know  She doesn't check it or have a meter. Checks Blood Sugar _____ times a day  Blood Thinner Instructions:ASA/ Little Aspirin Instructions:stop 5 days prior/ Marcello Moores Last Dose:12/06/19  Anesthesia review:   Patient denies shortness of breath, fever, cough and chest pain at PAT appointment  yes   Patient verbalized understanding of instructions that were given to them at the PAT appointment. Patient was also instructed that they will need to review over the PAT instructions again at home before surgery. Yes  Pt gets a little winded after 2 flights of steps because of her weight. She has a sedentary life style but no SOB doing house work or ADLs.

## 2019-12-09 ENCOUNTER — Ambulatory Visit: Payer: Self-pay | Admitting: General Surgery

## 2019-12-10 ENCOUNTER — Other Ambulatory Visit: Payer: Self-pay

## 2019-12-10 ENCOUNTER — Ambulatory Visit (HOSPITAL_COMMUNITY): Payer: Medicare Other | Admitting: Certified Registered Nurse Anesthetist

## 2019-12-10 ENCOUNTER — Ambulatory Visit (HOSPITAL_COMMUNITY): Payer: Medicare Other

## 2019-12-10 ENCOUNTER — Encounter (HOSPITAL_COMMUNITY): Payer: Self-pay | Admitting: General Surgery

## 2019-12-10 ENCOUNTER — Encounter (HOSPITAL_COMMUNITY)
Admission: RE | Disposition: A | Discharge status: Home / Self Care | Payer: Self-pay | Source: Home / Self Care | Attending: General Surgery

## 2019-12-10 ENCOUNTER — Ambulatory Visit (HOSPITAL_COMMUNITY)
Admission: RE | Admit: 2019-12-10 | Discharge: 2019-12-10 | Disposition: A | Discharge status: Home / Self Care | Payer: Medicare Other | Attending: General Surgery | Admitting: General Surgery

## 2019-12-10 DIAGNOSIS — I10 Essential (primary) hypertension: Secondary | ICD-10-CM | POA: Diagnosis not present

## 2019-12-10 DIAGNOSIS — Z79899 Other long term (current) drug therapy: Secondary | ICD-10-CM | POA: Diagnosis not present

## 2019-12-10 DIAGNOSIS — Z7984 Long term (current) use of oral hypoglycemic drugs: Secondary | ICD-10-CM | POA: Diagnosis not present

## 2019-12-10 DIAGNOSIS — K219 Gastro-esophageal reflux disease without esophagitis: Secondary | ICD-10-CM | POA: Insufficient documentation

## 2019-12-10 DIAGNOSIS — J984 Other disorders of lung: Secondary | ICD-10-CM | POA: Diagnosis not present

## 2019-12-10 DIAGNOSIS — E119 Type 2 diabetes mellitus without complications: Secondary | ICD-10-CM | POA: Insufficient documentation

## 2019-12-10 DIAGNOSIS — C182 Malignant neoplasm of ascending colon: Secondary | ICD-10-CM | POA: Diagnosis not present

## 2019-12-10 DIAGNOSIS — M1711 Unilateral primary osteoarthritis, right knee: Secondary | ICD-10-CM | POA: Diagnosis not present

## 2019-12-10 DIAGNOSIS — Z95828 Presence of other vascular implants and grafts: Secondary | ICD-10-CM

## 2019-12-10 DIAGNOSIS — Z452 Encounter for adjustment and management of vascular access device: Secondary | ICD-10-CM | POA: Diagnosis not present

## 2019-12-10 DIAGNOSIS — C189 Malignant neoplasm of colon, unspecified: Secondary | ICD-10-CM | POA: Diagnosis not present

## 2019-12-10 DIAGNOSIS — Z853 Personal history of malignant neoplasm of breast: Secondary | ICD-10-CM | POA: Diagnosis not present

## 2019-12-10 HISTORY — PX: PORTACATH PLACEMENT: SHX2246

## 2019-12-10 LAB — GLUCOSE, CAPILLARY
Glucose-Capillary: 134 mg/dL — ABNORMAL HIGH (ref 70–99)
Glucose-Capillary: 124 mg/dL — ABNORMAL HIGH (ref 70–99)

## 2019-12-10 SURGERY — INSERTION, TUNNELED CENTRAL VENOUS DEVICE, WITH PORT
Anesthesia: General

## 2019-12-10 MED ORDER — FENTANYL CITRATE (PF) 100 MCG/2ML IJ SOLN
INTRAMUSCULAR | Status: DC | PRN
  Administered 2019-12-10 (×2): 25 ug via INTRAVENOUS
  Administered 2019-12-10: 50 ug via INTRAVENOUS

## 2019-12-10 MED ORDER — ORAL CARE MOUTH RINSE: 15.0000 mL | Freq: Once | OROMUCOSAL | Status: AC

## 2019-12-10 MED ORDER — PHENYLEPHRINE 40 MCG/ML (10ML) SYRINGE FOR IV PUSH (FOR BLOOD PRESSURE SUPPORT)
PREFILLED_SYRINGE | INTRAVENOUS | Status: DC | PRN
  Administered 2019-12-10: 80 ug via INTRAVENOUS

## 2019-12-10 MED ORDER — PROPOFOL 10 MG/ML IV BOLUS
INTRAVENOUS | Status: AC
  Filled 2019-12-10: qty 20

## 2019-12-10 MED ORDER — DEXAMETHASONE SODIUM PHOSPHATE 10 MG/ML IJ SOLN
INTRAMUSCULAR | Status: DC | PRN
  Administered 2019-12-10: 4 mg via INTRAVENOUS

## 2019-12-10 MED ORDER — BUPIVACAINE-EPINEPHRINE (PF) 0.25% -1:200000 IJ SOLN
INTRAMUSCULAR | Status: AC
  Filled 2019-12-10: qty 30

## 2019-12-10 MED ORDER — DEXAMETHASONE SODIUM PHOSPHATE 10 MG/ML IJ SOLN
INTRAMUSCULAR | Status: AC
  Filled 2019-12-10: qty 1

## 2019-12-10 MED ORDER — FENTANYL CITRATE (PF) 100 MCG/2ML IJ SOLN: 25.0000 ug | INTRAMUSCULAR | Status: DC | PRN

## 2019-12-10 MED ORDER — CEFAZOLIN SODIUM-DEXTROSE 2-4 GM/100ML-% IV SOLN
2.0000 g | INTRAVENOUS | Status: AC
  Administered 2019-12-10: 2 g via INTRAVENOUS
  Filled 2019-12-10: qty 100

## 2019-12-10 MED ORDER — SODIUM CHLORIDE 0.9% FLUSH: 3.0000 mL | Freq: Two times a day (BID) | INTRAVENOUS | Status: DC

## 2019-12-10 MED ORDER — CHLORHEXIDINE GLUCONATE 0.12 % MT SOLN
15.0000 mL | Freq: Once | OROMUCOSAL | Status: AC
  Administered 2019-12-10: 15 mL via OROMUCOSAL

## 2019-12-10 MED ORDER — SODIUM CHLORIDE 0.9 % IV SOLN
Freq: Once | INTRAVENOUS | Status: AC
  Filled 2019-12-10: qty 1.2

## 2019-12-10 MED ORDER — KETOROLAC TROMETHAMINE 30 MG/ML IJ SOLN: 15.0000 mg | Freq: Once | INTRAMUSCULAR | Status: DC | PRN

## 2019-12-10 MED ORDER — MIDAZOLAM HCL 2 MG/2ML IJ SOLN
INTRAMUSCULAR | Status: AC
  Filled 2019-12-10: qty 2

## 2019-12-10 MED ORDER — LACTATED RINGERS IV SOLN: INTRAVENOUS | Status: DC

## 2019-12-10 MED ORDER — LIDOCAINE 2% (20 MG/ML) 5 ML SYRINGE
INTRAMUSCULAR | Status: DC | PRN
  Administered 2019-12-10: 100 mg via INTRAVENOUS

## 2019-12-10 MED ORDER — PROPOFOL 10 MG/ML IV BOLUS
INTRAVENOUS | Status: DC | PRN
  Administered 2019-12-10: 150 mg via INTRAVENOUS

## 2019-12-10 MED ORDER — ACETAMINOPHEN 500 MG PO TABS
1000.0000 mg | ORAL_TABLET | ORAL | Status: AC
  Administered 2019-12-10: 1000 mg via ORAL
  Filled 2019-12-10: qty 2

## 2019-12-10 MED ORDER — BUPIVACAINE-EPINEPHRINE 0.25% -1:200000 IJ SOLN
INTRAMUSCULAR | Status: DC | PRN
  Administered 2019-12-10: 17 mL

## 2019-12-10 MED ORDER — HEPARIN SOD (PORK) LOCK FLUSH 100 UNIT/ML IV SOLN
INTRAVENOUS | Status: DC | PRN
  Administered 2019-12-10: 500 [IU] via INTRAVENOUS

## 2019-12-10 MED ORDER — ONDANSETRON HCL 4 MG/2ML IJ SOLN
INTRAMUSCULAR | Status: AC
  Filled 2019-12-10: qty 2

## 2019-12-10 MED ORDER — FENTANYL CITRATE (PF) 100 MCG/2ML IJ SOLN
INTRAMUSCULAR | Status: AC
  Filled 2019-12-10: qty 2

## 2019-12-10 MED ORDER — ONDANSETRON HCL 4 MG/2ML IJ SOLN
INTRAMUSCULAR | Status: DC | PRN
  Administered 2019-12-10: 4 mg via INTRAVENOUS

## 2019-12-10 MED ORDER — PROMETHAZINE HCL 25 MG/ML IJ SOLN: 6.2500 mg | INTRAMUSCULAR | Status: DC | PRN

## 2019-12-10 MED ORDER — HEPARIN SOD (PORK) LOCK FLUSH 100 UNIT/ML IV SOLN
INTRAVENOUS | Status: AC
  Filled 2019-12-10: qty 5

## 2019-12-10 MED ORDER — MIDAZOLAM HCL 5 MG/5ML IJ SOLN
INTRAMUSCULAR | Status: DC | PRN
  Administered 2019-12-10: 2 mg via INTRAVENOUS

## 2019-12-10 SURGICAL SUPPLY — 32 items
ADH SKN CLS APL DERMABOND .7 (GAUZE/BANDAGES/DRESSINGS) ×1
APL PRP STRL LF DISP 70% ISPRP (MISCELLANEOUS) ×1
BAG DECANTER FOR FLEXI CONT (MISCELLANEOUS) ×2 IMPLANT
BLADE SURG 15 STRL LF DISP TIS (BLADE) ×1 IMPLANT
BLADE SURG 15 STRL SS (BLADE) ×2
CHLORAPREP W/TINT 26 (MISCELLANEOUS) ×2 IMPLANT
COVER WAND RF STERILE (DRAPES) IMPLANT
DECANTER SPIKE VIAL GLASS SM (MISCELLANEOUS) ×2 IMPLANT
DERMABOND ADVANCED (GAUZE/BANDAGES/DRESSINGS) ×1
DERMABOND ADVANCED .7 DNX12 (GAUZE/BANDAGES/DRESSINGS) ×1 IMPLANT
DRAPE C-ARM 42X120 X-RAY (DRAPES) ×2 IMPLANT
DRAPE LAPAROTOMY TRNSV 102X78 (DRAPES) ×2 IMPLANT
ELECT REM PT RETURN 15FT ADLT (MISCELLANEOUS) ×2 IMPLANT
GAUZE 4X4 16PLY RFD (DISPOSABLE) ×2 IMPLANT
GLOVE BIO SURGEON STRL SZ 6.5 (GLOVE) ×2 IMPLANT
GLOVE BIOGEL PI IND STRL 7.0 (GLOVE) ×1 IMPLANT
GLOVE BIOGEL PI INDICATOR 7.0 (GLOVE) ×1
GOWN STRL REUS W/TWL XL LVL3 (GOWN DISPOSABLE) ×4 IMPLANT
KIT BASIN OR (CUSTOM PROCEDURE TRAY) ×2 IMPLANT
KIT PORT POWER 8FR ISP CVUE (Port) ×2 IMPLANT
KIT TURNOVER KIT A (KITS) ×2 IMPLANT
NEEDLE HYPO 25X1 1.5 SAFETY (NEEDLE) ×2 IMPLANT
PACK BASIC VI WITH GOWN DISP (CUSTOM PROCEDURE TRAY) ×2 IMPLANT
PENCIL SMOKE EVACUATOR (MISCELLANEOUS) ×2 IMPLANT
SUT PROLENE 2 0 SH DA (SUTURE) ×2 IMPLANT
SUT VIC AB 3-0 SH 27 (SUTURE) ×2
SUT VIC AB 3-0 SH 27XBRD (SUTURE) ×1 IMPLANT
SUT VICRYL 4-0 PS2 18IN ABS (SUTURE) ×2 IMPLANT
SYR 10ML LL (SYRINGE) ×2 IMPLANT
SYR CONTROL 10ML LL (SYRINGE) ×2 IMPLANT
TOWEL OR 17X26 10 PK STRL BLUE (TOWEL DISPOSABLE) ×2 IMPLANT
TOWEL OR NON WOVEN STRL DISP B (DISPOSABLE) ×2 IMPLANT

## 2019-12-10 NOTE — Transfer of Care (Signed)
Immediate Anesthesia Transfer of Care Note  Patient: Kim Castro  Procedure(s) Performed: INSERTION PORT-A-CATH ULTRASOUND GUIDED IN RIGHT IJ (N/A )  Patient Location: PACU  Anesthesia Type:General  Level of Consciousness: awake, oriented, patient cooperative and responds to stimulation  Airway & Oxygen Therapy: Patient Spontanous Breathing and Patient connected to face mask oxygen  Post-op Assessment: Report given to RN and Post -op Vital signs reviewed and stable  Post vital signs: Reviewed and stable  Last Vitals:  Vitals Value Taken Time  BP 153/74 12/10/19 1050  Temp    Pulse 73 12/10/19 1051  Resp 15 12/10/19 1051  SpO2 100 % 12/10/19 1051  Vitals shown include unvalidated device data.  Last Pain:  Vitals:   12/10/19 0853  TempSrc:   PainSc: 0-No pain         Complications: No complications documented.

## 2019-12-10 NOTE — Anesthesia Preprocedure Evaluation (Signed)
Anesthesia Evaluation  Patient identified by MRN, date of birth, ID band Patient awake    Reviewed: Allergy & Precautions, NPO status , Patient's Chart, lab work & pertinent test results  Airway Mallampati: II  TM Distance: >3 FB Neck ROM: Full    Dental no notable dental hx.    Pulmonary neg pulmonary ROS,    Pulmonary exam normal breath sounds clear to auscultation       Cardiovascular hypertension, Normal cardiovascular exam Rhythm:Regular Rate:Normal     Neuro/Psych negative neurological ROS  negative psych ROS   GI/Hepatic negative GI ROS, Neg liver ROS,   Endo/Other  diabetesMorbid obesity  Renal/GU negative Renal ROS  negative genitourinary   Musculoskeletal negative musculoskeletal ROS (+)   Abdominal   Peds negative pediatric ROS (+)  Hematology negative hematology ROS (+)   Anesthesia Other Findings   Reproductive/Obstetrics negative OB ROS                             Anesthesia Physical Anesthesia Plan  ASA: III  Anesthesia Plan: General   Post-op Pain Management:    Induction: Intravenous  PONV Risk Score and Plan: 3 and Ondansetron and Dexamethasone  Airway Management Planned: LMA  Additional Equipment:   Intra-op Plan:   Post-operative Plan: Extubation in OR  Informed Consent: I have reviewed the patients History and Physical, chart, labs and discussed the procedure including the risks, benefits and alternatives for the proposed anesthesia with the patient or authorized representative who has indicated his/her understanding and acceptance.     Dental advisory given  Plan Discussed with: CRNA and Surgeon  Anesthesia Plan Comments:         Anesthesia Quick Evaluation

## 2019-12-10 NOTE — Discharge Instructions (Signed)
    PORT-A-CATH: POST OP INSTRUCTIONS  Always review your discharge instruction sheet given to you by the facility where your surgery was performed.   1. A prescription for pain medication may be given to you upon discharge. Take your pain medication as prescribed, if needed. If narcotic pain medicine is not needed, then you make take acetaminophen (Tylenol) or ibuprofen (Advil) as needed.  2. Take your usually prescribed medications unless otherwise directed. 3. If you need a refill on your pain medication, please contact our office. All narcotic pain medicine now requires a paper prescription.  Phoned in and fax refills are no longer allowed by law.  Prescriptions will not be filled after 5 pm or on weekends.  4. You should follow a light diet for the remainder of the day after your procedure. 5. Most patients will experience some mild swelling and/or bruising in the area of the incision. It may take several days to resolve. 6. It is common to experience some constipation if taking pain medication after surgery. Increasing fluid intake and taking a stool softener (such as Colace) will usually help or prevent this problem from occurring. A mild laxative (Milk of Magnesia or Miralax) should be taken according to package directions if there are no bowel movements after 48 hours.  7. Unless discharge instructions indicate otherwise, you may remove your bandages 48 hours after surgery, and you may shower at that time. You may have steri-strips (small white skin tapes) in place directly over the incision.  These strips should be left on the skin for 7-10 days.  If your surgeon used Dermabond (skin glue) on the incision, you may shower in 24 hours.  The glue will flake off over the next 2-3 weeks.  8. If your port is left accessed at the end of surgery (needle left in port), the dressing cannot get wet and should only by changed by a healthcare professional. When the port is no longer accessed (when the  needle has been removed), follow step 7.   9. ACTIVITIES:  Limit activity involving your arms for the next 72 hours. Do no strenuous exercise or activity for 1 week. You may drive when you are no longer taking prescription pain medication, you can comfortably wear a seatbelt, and you can maneuver your car. 10.You may need to see your doctor in the office for a follow-up appointment.  Please       check with your doctor.  11.When you receive a new Port-a-Cath, you will get a product guide and        ID card.  Please keep them in case you need them.  WHEN TO CALL YOUR DOCTOR ([phone]): 1. Fever over 101.0 2. Chills 3. Continued bleeding from incision 4. Increased redness and tenderness at the site 5. Shortness of breath, difficulty breathing   The clinic staff is available to answer your questions during regular business hours. Please don't hesitate to call and ask to speak to one of the nurses or medical assistants for clinical concerns. If you have a medical emergency, go to the nearest emergency room or call 911.  A surgeon from [facility] is always on call at the hospital.     For further information, please visit [email or web address]      

## 2019-12-10 NOTE — H&P (Signed)
The patient is a 74 year old female with colorectal cancer.  Path shows stage 3 colon cancer.  IV chemotherapy was recommended.  Problem List/Past Medical DCIS (DUCTAL CARCINOMA IN SITU), LEFT (D05.12) COLON CANCER, ASCENDING (C18.2)  Past Surgical History  Breast Biopsy Bilateral. Cataract Surgery Right. Colon Polyp Removal - Colonoscopy Knee Surgery Bilateral. Oral Surgery  Diagnostic Studies History  Colonoscopy 1-5 years ago Mammogram within last year Pap Smear 1-5 years ago  Allergies  Codeine Phosphate *ANALGESICS - OPIOID* Nausea, Vomiting. Allergies Reconciled No Known Drug Allergies [07/23/2014]:  Medication History  metFORMIN HCl (500MG  Tablet, Oral) Active. Pravastatin Sodium (10MG  Tablet, Oral) Active. Multi-Vitamin (Oral) Active. Anastrozole (1MG  Tablet, Oral) Active. Losartan Potassium (25MG  Tablet, Oral) Active. Medications Reconciled  Social History  No alcohol use No caffeine use No drug use Tobacco use Never smoker.  Family History  Breast Cancer Sister. Colon Cancer Brother. Depression Sister. Diabetes Mellitus Mother, Sister. Heart Disease Father, Sister. Heart disease in female family member before age 63 Heart disease in female family member before age 57 Hypertension Brother, Father, Sister. Kidney Disease Sister. Ovarian Cancer Mother. Seizure disorder Sister. Thyroid problems Brother, Daughter, Sister.  Pregnancy / Birth History  Contraceptive History Oral contraceptives. Age at menarche 85 years. Age of menopause 68-55 Gravida 4 Irregular periods Maternal age 24-20 Para 3  Other Problems  Diabetes Mellitus Back Pain Gastroesophageal Reflux Disease Hemorrhoids High blood pressure Lump In Breast     Review of Systems  General Not Present- Appetite Loss, Chills, Fatigue, Fever, Night Sweats, Weight Gain and Weight Loss. Skin Present- Dryness. Not Present-  Change in Wart/Mole, Hives, Jaundice, New Lesions, Non-Healing Wounds, Rash and Ulcer. HEENT Present- Seasonal Allergies and Wears glasses/contact lenses. Not Present- Earache, Hearing Loss, Hoarseness, Nose Bleed, Oral Ulcers, Ringing in the Ears, Sinus Pain, Sore Throat, Visual Disturbances and Yellow Eyes. Respiratory Present- Chronic Cough and Snoring. Not Present- Bloody sputum, Difficulty Breathing and Wheezing. Breast Not Present- Breast Mass, Breast Pain, Nipple Discharge and Skin Changes. Cardiovascular Present- Leg Cramps and Swelling of Extremities. Not Present- Chest Pain, Difficulty Breathing Lying Down, Palpitations, Rapid Heart Rate and Shortness of Breath. Gastrointestinal Not Present- Abdominal Pain, Bloating, Bloody Stool, Change in Bowel Habits, Chronic diarrhea, Constipation, Difficulty Swallowing, Excessive gas, Gets full quickly at meals, Hemorrhoids, Indigestion, Nausea, Rectal Pain and Vomiting. Female Genitourinary Not Present- Frequency, Nocturia, Painful Urination, Pelvic Pain and Urgency. Musculoskeletal Present- Back Pain. Not Present- Joint Pain, Joint Stiffness, Muscle Pain, Muscle Weakness and Swelling of Extremities. Neurological Not Present- Decreased Memory, Fainting, Headaches, Numbness, Seizures, Tingling, Tremor, Trouble walking and Weakness. Psychiatric Not Present- Anxiety, Bipolar, Change in Sleep Pattern, Depression, Fearful and Frequent crying. Endocrine Present- Cold Intolerance. Not Present- Excessive Hunger, Hair Changes, Heat Intolerance, Hot flashes and New Diabetes. Hematology Not Present- Easy Bruising, Excessive bleeding, Gland problems, HIV and Persistent Infections.  BP (!) 172/67   Pulse (!) 59   Temp 98 F (36.7 C) (Oral)   Resp 17   Ht 5\' 6"  (1.676 m)   Wt 110.7 kg   SpO2 95%   BMI 39.39 kg/m     Physical Exam   General Mental Status-Alert. General Appearance-Cooperative. CV: RRR Lungs: CTA Abdomen Inspection Skin -  Scar - Periumbilical. Palpation/Percussion Palpation and Percussion of the abdomen reveal - Soft and Non Tender.    Assessment & Plan  COLON CANCER, ASCENDING (C18.2)  She is in need of port placement for adjuvant IV chemotherapy.  We will plan on US guided port placement.  Risks include bleeding, infection, pneumothorax, device malfunction and need for additional procedures.  I believe she understands this and agrees to proceed.  All questions were answered.

## 2019-12-10 NOTE — Op Note (Signed)
12/10/2019  2:12 PM  PATIENT:  Kim Castro  74 y.o. female  Patient Care Team: Hulan Fess, MD as PCP - General (Family Medicine) Excell Seltzer, MD (Inactive) as Consulting Physician (General Surgery) Rolm Bookbinder, MD as Consulting Physician (General Surgery) Thea Silversmith, MD as Consulting Physician (Radiation Oncology) Holley Bouche, NP (Inactive) as Nurse Practitioner (Nurse Practitioner) Truitt Merle, MD as Consulting Physician (Hematology) Jonnie Finner, RN as Oncology Nurse Navigator Leighton Ruff, MD as Consulting Physician (General Surgery) Nicholas Lose, MD as Consulting Physician (Hematology and Oncology) Leighton Ruff, MD as Consulting Physician (General Surgery)  PRE-OPERATIVE DIAGNOSIS:  COLON CANCER  POST-OPERATIVE DIAGNOSIS:  COLON CANCER  PROCEDURE:  Procedure(s): INSERTION PORT-A-CATH ULTRASOUND GUIDED IN RIGHT IJ    Surgeon(s): Leighton Ruff, MD  ANESTHESIA:   local and general  EBL: 5 ml Total I/O In: 134.3 [I.V.:34.3; IV Piggyback:100] Out: 5 [Blood:5]  DISPOSITION OF SPECIMEN:  N/A  COUNTS:  YES  PLAN OF CARE: Discharge to home after PACU  PATIENT DISPOSITION:  PACU - hemodynamically stable.  INDICATION: Patient with need for IV chemotherapy. Port-A-Cath placement was requested.   Use of a central venous catheter for intravenous therapy was discussed. Technique of catheter placement using ultrasound and fluoroscopy guidance was discussed. Risks such as bleeding, infection, pneumothorax, catheter occlusion, reoperation, and other risks were discussed. I noted a good likelihood this will help address the problem. Questions were answered. The patient expressed understanding & wishes to proceed.   Findings: Normal-appearing anatomy.   8 [place] power port. It goes through the right internal jugular vein   Procedure: Informed consent was confirmed. Patient was brought the operating room and positioned supine. Arms were  tucked. The patient underwent deep sedation. Neck and chest were clipped and prepped and draped in a sterile fashion. A surgical timeout confirmed our plan. I placed a field block of local anesthesia on the chest.  I entered into the right internal jugular vein on the first venipuncture using US guidance. Non-pulsatile blood was returned. Wire was easily passed into the inferior vena cava and confirmed by fluoroscopy. I confirmed placement of the wire in the right side of the chest.  I made an incision in the lateral infraclavicular area and made a subcutaneous pocket. I used a dilator on the wire using Seldinger technique to dilate the tract under fluoroscopy. I placed the catheter into the sheath. I then peeled away the dilator sheath. I tunneled the power port from the puncture site to the chest pocket. I cut the catheter to appropriate length and attached it to the port using the plastic connector. The port was placed into the pocket and secured to the left anterior chest wall using 2-0 Prolene interrupted stitches x2. Catheter flushed well.  Fluoroscopy confirmed the tip in the distal SVC. Catheter aspirated and flushed well. On final fluoro reevaluation the tip seen to be in good position in the distal SVC.  I closed the wounds using 3-0 Vicryl interrupted sutures for the pocket and 4-0 Vicryl stitch was used to close the skin. Dermabond was used on the 2 incisions. CXR will be performed in PACU. Patient should go home later today. Catheter is okay to use.

## 2019-12-10 NOTE — Anesthesia Procedure Notes (Addendum)
Procedure Name: LMA Insertion Date/Time: 12/10/2019 9:56 AM Performed by: West Pugh, CRNA Pre-anesthesia Checklist: Patient identified, Emergency Drugs available, Suction available, Patient being monitored and Timeout performed Patient Re-evaluated:Patient Re-evaluated prior to induction Oxygen Delivery Method: Circle system utilized Preoxygenation: Pre-oxygenation with 100% oxygen Induction Type: IV induction LMA: LMA with gastric port inserted LMA Size: 4.0 Tube size: 7.0 mm Number of attempts: 1 Placement Confirmation: positive ETCO2 and breath sounds checked- equal and bilateral Tube secured with: Tape Dental Injury: Teeth and Oropharynx as per pre-operative assessment

## 2019-12-11 ENCOUNTER — Telehealth: Payer: Self-pay | Admitting: Hematology

## 2019-12-11 NOTE — Progress Notes (Signed)
Pharmacist Chemotherapy Monitoring - Initial Assessment    Anticipated start date: 12/17/19  Regimen:  . Are orders appropriate based on the patient's diagnosis, regimen, and cycle? Yes . Does the plan date match the patient's scheduled date? Yes . Is the sequencing of drugs appropriate? Yes . Are the premedications appropriate for the patient's regimen? Yes . Prior Authorization for treatment is: Approved o If applicable, is the correct biosimilar selected based on the patient's insurance? not applicable  Organ Function and Labs: Marland Kitchen Are dose adjustments needed based on the patient's renal function, hepatic function, or hematologic function? No . Are appropriate labs ordered prior to the start of patient's treatment? Yes . Other organ system assessment, if indicated: N/A . The following baseline labs, if indicated, have been ordered: N/A  Dose Assessment: . Are the drug doses appropriate? Yes . Are the following correct: o Drug concentrations Yes o IV fluid compatible with drug Yes o Administration routes Yes o Timing of therapy Yes . If applicable, does the patient have documented access for treatment and/or plans for port-a-cath placement? yes . If applicable, have lifetime cumulative doses been properly documented and assessed? yes Lifetime Dose Tracking  No doses have been documented on this patient for the following tracked chemicals: Doxorubicin, Epirubicin, Idarubicin, Daunorubicin, Mitoxantrone, Bleomycin, Oxaliplatin, Carboplatin, Liposomal Doxorubicin  o   Toxicity Monitoring/Prevention: . The patient has the following take home antiemetics prescribed: Ondansetron, Prochlorperazine, Dexamethasone and Lorazepam . The patient has the following take home medications prescribed: N/A . Medication allergies and previous infusion related reactions, if applicable, have been reviewed and addressed. Yes . The patient's current medication list has been assessed for drug-drug  interactions with their chemotherapy regimen. no significant drug-drug interactions were identified on review.  Order Review: . Are the treatment plan orders signed? Yes . Is the patient scheduled to see a provider prior to their treatment? Yes  I verify that I have reviewed each item in the above checklist and answered each question accordingly.  Adelina Mings 12/11/2019 12:40 PM

## 2019-12-11 NOTE — Anesthesia Postprocedure Evaluation (Signed)
Anesthesia Post Note  Patient: Kim Castro  Procedure(s) Performed: INSERTION PORT-A-CATH ULTRASOUND GUIDED IN RIGHT IJ (N/A )     Patient location during evaluation: PACU Anesthesia Type: General Level of consciousness: awake and alert Pain management: pain level controlled Vital Signs Assessment: post-procedure vital signs reviewed and stable Respiratory status: spontaneous breathing, nonlabored ventilation, respiratory function stable and patient connected to nasal cannula oxygen Cardiovascular status: blood pressure returned to baseline and stable Postop Assessment: no apparent nausea or vomiting Anesthetic complications: no   No complications documented.  Last Vitals:  Vitals:   12/10/19 1115 12/10/19 1143  BP: (!) 124/58 (!) 172/71  Pulse: 60 61  Resp: 13 16  Temp: 36.6 C   SpO2: 97% 95%    Last Pain:  Vitals:   12/10/19 1143  TempSrc:   PainSc: 0-No pain                 Kyden Potash S

## 2019-12-11 NOTE — Telephone Encounter (Signed)
Scheduled per 8/10 staff message. Pt is aware of appt time and date. Noted to give pt appt calendar on next visit

## 2019-12-12 ENCOUNTER — Other Ambulatory Visit: Payer: Self-pay | Admitting: Hematology and Oncology

## 2019-12-12 ENCOUNTER — Encounter (HOSPITAL_COMMUNITY): Payer: Self-pay | Admitting: General Surgery

## 2019-12-12 DIAGNOSIS — C50412 Malignant neoplasm of upper-outer quadrant of left female breast: Secondary | ICD-10-CM

## 2019-12-16 NOTE — Progress Notes (Signed)
Kim Castro   [phone] [phone]   Clinic Follow up Note   Patient Care Team: Hulan Fess, MD as PCP - General (Family Medicine) Excell Seltzer, MD (Inactive) as Consulting Physician (General Surgery) Rolm Bookbinder, MD as Consulting Physician (General Surgery) Thea Silversmith, MD as Consulting Physician (Radiation Oncology) Holley Bouche, NP (Inactive) as Nurse Practitioner (Nurse Practitioner) Truitt Merle, MD as Consulting Physician (Hematology) Jonnie Finner, RN as Oncology Nurse Navigator Leighton Ruff, MD as Consulting Physician (General Surgery) Nicholas Lose, MD as Consulting Physician (Hematology and Oncology) Leighton Ruff, MD as Consulting Physician (General Surgery) 12/17/2019  CHIEF COMPLAINT: F/u colon cancer   SUMMARY OF ONCOLOGIC HISTORY: Oncology History Overview Note  Cancer Staging Breast cancer of upper-outer quadrant of left female breast [facility]) Staging form: Breast, AJCC 7th Edition - Clinical stage from 06/11/2014: Stage 0 (Tis (DCIS), N0, M0) - Unsigned - Pathologic stage from 07/03/2014: Stage Unknown (Tis (DCIS), NX, cM0) - Signed by Enid Cutter, MD on 07/10/2014 Staging comments: Staged on final lumpectomy specimen by Dr. Donato Heinz.    Breast cancer of upper-outer quadrant of left female breast ([place])  05/29/2014 Initial Biopsy   Left breast needle core biopsy: Grade 2, DCIS with calcs. ER+ (100%), PR+ (96%).    06/04/2014 Initial Diagnosis   Left breast DCIS with calcifications, ER 100%, PR 96%   06/10/2014 Breast MRI   Left breast: 2.4 x 1.3 x 1.1 cm area of patchy non-mass enhancement upper outer quadrant includes postbiopsy seroma; Right breast: 1.2 cm previously biopsied stable benign fibroadenoma   06/12/2014 Procedure   Genetic counseling/testing: Identified 1 VUS on CHEK2 gene. Remainder of 17 gene panel tested negative and included: ATM, BARD1, BRCA 1/2, BRIP1, CDH1, CHEK2, EPCAM, MLH1, MSH2, MSH6,  NBN, NF1, PALB2, PTEN, RAD50, RAD51C, RAD51D, STK11, and TP53.    07/01/2014 Surgery   Left breast lumpectomy ([facility]): Grade 1, DCIS, spanning 2.3 cm, 1 mm margin, ER 100%, PR 96%   07/31/2014 - 08/28/2014 Radiation Therapy   Adjuvant RT completed Pablo Ledger). Left breast: Total dose 42.5 Gy over 17 fractions. Left breast boost: Total dose 7.5 Gy over 3 fractions.    09/14/2014 -  Anti-estrogen oral therapy   Anastrazole '1mg'$  daily. Planned duration of treatment: 5 years Lindi Adie)   09/25/2014 Survivorship   Survivorship Care Plan given to patient and reviewed with her in person.    right colon cancer  09/19/2019 Imaging   CT AP W contrast 09/19/19  IMPRESSION Fullness in the cecum, cannot exclude a mass. No evidence for metastatic disease is identified.    09/24/2019 Procedure   Colonoscopy by Dr Eber Jones 09/24/19 IMPRESSION 1. The colon was redundant  2. Mild diverticulosis was noted through the entire examined colon 3. Single 56m polyp was found in the ascending colon; polypectomy was performed using snare cautery and biopsy forceps 4. Mild diverticulosis was notes in the descending colon and sigmoid colon.  5. Single polyp was found in the sigmoid colon, polypectomy was performed with cold forceps.  6. Single polyp was found in the rectosigmoid colon; polypectomy was performed with cold snare  7. Small internal hemorrhoids  8. Large mass was found at the cecum; multiple biopsies of the area were performed using cold forceps; injection (tattooing) was performed distal to the mass.    09/24/2019 Initial Biopsy   INTERPRETATION AND DIAGNOSIS:  A. Cecum, biopsy:  Invasive moderately differentiated adenocarcinoma.  see comment  B. Polyp @ ascending colon, polypectomy:  Tubular Adenoma  C.  Polyp @ sigmoid colon Polypectomy:  hyperplastic polyp.  D. Polyp @ rectosigmoid colon, Polypectomy:  Hyperplastic Polyp      10/16/2019 Imaging   CT Chest IMPRESSION: 1. Multiple small pulmonary  nodules measuring 5 mm or less in size in the lungs. These are nonspecific and are typically considered statistically likely benign. However, given the patient's history of primary malignancy, close attention on follow-up studies is recommended to ensure stability. 2. Aortic atherosclerosis, in addition to right coronary artery disease. Assessment for potential risk factor modification, dietary therapy or pharmacologic therapy may be warranted, if clinically indicated. 3. There are calcifications of the aortic valve and mitral annulus. Echocardiographic correlation for evaluation of potential valvular dysfunction may be warranted if clinically indicated. 4. Small hiatal hernia.   Aortic Atherosclerosis (ICD10-I70.0).   11/01/2019 Initial Diagnosis   Colon cancer ([place])   11/01/2019 Surgery   LAPAROSCOPIC PARTIAL COLECTOMY by Dr Marcello Moores and Dr Johney Maine   11/01/2019 Pathology Results   FINAL MICROSCOPIC DIAGNOSIS:   A. COLON, PROXIMAL RIGHT, COLECTOMY:  - Invasive colonic adenocarcinoma, 5 cm.  - Tumor invades through the muscularis propria into pericolonic tissues.   - Margins of resection are not involved.  - Metastatic carcinoma in (5) of (13) lymph nodes.  - See oncology table.    MSI Stable  Mismatch repair normal  MLH1 - Preserved nuclear expression (greater 50% tumor expression) MSH2 - Preserved nuclear expression (greater 50% tumor expression) MSH6 - Preserved nuclear expression (greater 50% tumor expression) PMS2 - Preserved nuclear expression (greater 50% tumor expression)   11/01/2019 Cancer Staging   Staging form: Colon and Rectum, AJCC 8th Edition - Pathologic stage from 11/01/2019: Stage IIIB (pT3, pN2a, cM0) - Signed by Truitt Merle, MD on 11/29/2019   12/17/2019 -  Chemotherapy   The patient had dexamethasone (DECADRON) 4 MG tablet, 8 mg, Oral, Daily, 1 of 1 cycle, Start date: --, End date: -- palonosetron (ALOXI) injection 0.25 mg, 0.25 mg, Intravenous,  Once, 1 of 12  cycles leucovorin 904 mg in dextrose 5 % 250 mL infusion, 400 mg/m2 = 904 mg, Intravenous,  Once, 1 of 12 cycles oxaliplatin (ELOXATIN) 190 mg in dextrose 5 % 500 mL chemo infusion, 85 mg/m2 = 190 mg, Intravenous,  Once, 1 of 12 cycles fluorouracil (ADRUCIL) chemo injection 900 mg, 400 mg/m2 = 900 mg, Intravenous,  Once, 1 of 12 cycles fluorouracil (ADRUCIL) 5,400 mg in sodium chloride 0.9 % 142 mL chemo infusion, 2,400 mg/m2 = 5,400 mg, Intravenous, 1 Day/Dose, 1 of 12 cycles  for chemotherapy treatment.      CURRENT THERAPY: PENDING Adjuvant FOLFOX q2 weeks, plan for 6 months   INTERVAL HISTORY: Kim Castro returns for f/u and to begin treatment.  She has recovered very well from colon cancer resection.  Denies pain, bowels moving up to twice per day with soft stool.  No nausea or vomiting.  Energy and appetite are adequate, she mowed the lawn last week, can do all the ADLs without difficulty or stopping for break. She had port placed last week by Dr. Marcello Moores which is a little sore.  Denies fever, chills, cough, chest pain, dyspnea, leg edema or new issues.  She has stable neuropathy in her feet at baseline.  MEDICAL HISTORY:  Past Medical History:  Diagnosis Date  . Aortic atherosclerosis ([place])   . Arthritis    feet, lower back  . Basal cell carcinoma    arm  . Breast cancer of upper-outer quadrant of left female breast ([place]) 06/04/2014  .  Cataract    immature on the left  . Colon cancer ([place]) 08/2019  . Diabetes mellitus without complication ([place])   . Diverticulosis   . Dizziness    > 87yr ago;took Antivert   . Family history of anesthesia complication    sister slow to wake up with anesthesia  . Family history of breast cancer   . Family history of colon cancer   . Family history of uterine cancer   . GERD (gastroesophageal reflux disease)    takes occasional TUMs  . History of bronchitis    > 89yr[age]  . History of colon polyps   . History of hiatal hernia    Small noted on  CT  . Hypertension    takes Losartan daily and HCTZ  . Iron deficiency anemia   . Joint pain   . Numbness    to toes on each foot  . Peripheral neuropathy    feet and toes  . Personal history of radiation therapy   . Pulmonary nodules    Noted on CT  . Radiation 07/31/14-08/28/14   Left Breast 20 fxs  . Seasonal allergies    takes Claritin prn  . Urinary frequency   . Vitamin D deficiency    takes VIt D daily    SURGICAL HISTORY: Past Surgical History:  Procedure Laterality Date  . BREAST BIOPSY Bilateral   . BREAST LUMPECTOMY Left   . BREAST LUMPECTOMY WITH RADIOACTIVE SEED LOCALIZATION Left 07/01/2014   Procedure: LEFT BREAST LUMPECTOMY WITH RADIOACTIVE SEED LOCALIZATION;  Surgeon: BeExcell SeltzerMD;  [facility] Service: General;  Laterality: Left;  . CATARACT EXTRACTION Right   . COLONOSCOPY    . LAPAROSCOPIC PARTIAL COLECTOMY N/A 11/01/2019   Procedure: LAPAROSCOPIC PARTIAL COLECTOMY;  Surgeon: ThLeighton RuffMD;  Location: [facility];  Service: General;  Laterality: N/A;  . PORTACATH PLACEMENT N/A 12/10/2019   Procedure: INSERTION PORT-A-CATH ULTRASOUND GUIDED IN RIGHT IJ;  Surgeon: ThLeighton RuffMD;  Location: [facility];  Service: General;  Laterality: N/A;  . TOTAL KNEE ARTHROPLASTY Left 10/24/2012   Procedure: TOTAL KNEE ARTHROPLASTY;  Surgeon: FrKerin SalenMD;  [place] [facility] Service: [facility]opedics;  Laterality: Left;  . TOTAL KNEE ARTHROPLASTY Right 01/09/2013   Procedure: TOTAL KNEE ARTHROPLASTY;  Surgeon: FrKerin SalenMD;  [facility]: [place]ics;  Laterality: Right;  . TUBAL LIGATION      I have reviewed the social history and family history with the patient and they are unchanged from previous note.  ALLERGIES:  is allergic to oxycodone.  MEDICATIONS:  Current Outpatient Medications  Medication Sig Dispense Refill  . anastrozole (ARIMIDEX) 1 MG tablet Take 1 tablet by mouth once daily 30 tablet 0  . aspirin EC 81 MG  tablet Take 81 mg by mouth daily. Swallow whole.    . Cholecalciferol (VITAMIN D) 2000 UNITS CAPS Take 2,000 Units by mouth daily.     . Ferrous Sulfate (SLOW FE PO) Take 1 tablet by mouth in the morning and at bedtime.     . Marland Kitchenbuprofen (ADVIL) 200 MG tablet Take 400 mg by mouth every 6 (six) hours as needed for moderate pain.     . Marland Kitchenoratadine (CLARITIN) 10 MG tablet Take 10 mg by mouth daily as needed for allergies.    . Marland Kitchenosartan (COZAAR) 25 MG tablet Take 25 mg by mouth daily.    . metFORMIN (GLUCOPHAGE) 500 MG tablet Take 1 tablet (500 mg total) by  mouth daily with breakfast. (Patient taking differently: Take 500 mg by mouth in the morning and at bedtime. )    . Multiple Vitamins-Minerals (MULTIVITAMIN WITH MINERALS) tablet Take 1 tablet by mouth daily.    Marland Kitchen omeprazole (PRILOSEC) 20 MG capsule Take 20 mg by mouth daily before breakfast.     . pravastatin (PRAVACHOL) 10 MG tablet Take 10 mg by mouth daily.    Marland Kitchen acetaminophen (TYLENOL) 500 MG tablet Take 2 tablets (1,000 mg total) by mouth every 6 (six) hours as needed. (Patient not taking: Reported on 12/03/2019) 30 tablet 0  . lidocaine-prilocaine (EMLA) cream Apply to affected area once (Patient taking differently: Apply 1 application topically daily as needed (port access). ) 30 g 3  . ondansetron (ZOFRAN) 8 MG tablet Take 1 tablet (8 mg total) by mouth 2 (two) times daily as needed for refractory nausea / vomiting. Start on day 3 after chemotherapy. 30 tablet 1  . prochlorperazine (COMPAZINE) 10 MG tablet Take 1 tablet (10 mg total) by mouth every 6 (six) hours as needed (Nausea or vomiting). 30 tablet 1   No current facility-administered medications for this visit.   Facility-Administered Medications Ordered in Other Visits  Medication Dose Route Frequency Provider Last Rate Last Admin  . dexamethasone (DECADRON) 10 mg in sodium chloride 0.9 % 50 mL IVPB  10 mg Intravenous Once Truitt Merle, MD      . fluorouracil (ADRUCIL) 5,400 mg in sodium  chloride 0.9 % 142 mL chemo infusion  2,400 mg/m2 (Treatment Plan Recorded) Intravenous 1 day or 1 dose Truitt Merle, MD      . fluorouracil (ADRUCIL) chemo injection 900 mg  400 mg/m2 (Treatment Plan Recorded) Intravenous Once Truitt Merle, MD      . leucovorin 904 mg in dextrose 5 % 250 mL infusion  400 mg/m2 (Treatment Plan Recorded) Intravenous Once Truitt Merle, MD      . oxaliplatin (ELOXATIN) 190 mg in dextrose 5 % 500 mL chemo infusion  85 mg/m2 (Treatment Plan Recorded) Intravenous Once Truitt Merle, MD      . palonosetron (ALOXI) injection 0.25 mg  0.25 mg Intravenous Once Truitt Merle, MD        PHYSICAL EXAMINATION: ECOG PERFORMANCE STATUS: 0 - Asymptomatic  Vitals:   12/17/19 1135 12/17/19 1138  BP: (!) 159/65 123/64  Pulse: 84   Resp: 18   Temp: 97.6 F (36.4 C)   SpO2: 97%    Filed Weights   12/17/19 1135  Weight: 243 lb 8 oz (110.5 kg)    GENERAL:alert, no distress and comfortable SKIN: No rash to exposed skin EYES: sclera clear NECK: Without mass LUNGS: normal breathing effort HEART:  no lower extremity edema ABDOMEN:abdomen soft, non-tender and normal bowel sounds.  Incision completely healed.  No hepatomegaly or mass NEURO: alert & oriented x 3 with fluent speech, normal gait PAC covered with gauze  LABORATORY DATA:  I have reviewed the data as listed CBC Latest Ref Rng & Units 12/17/2019 12/06/2019 11/04/2019  WBC 4.0 - 10.5 K/uL 9.7 8.0 7.2  Hemoglobin 12.0 - 15.0 g/dL 12.2 12.4 10.7(L)  Hematocrit 36 - 46 % 38.1 38.9 34.8(L)  Platelets 150 - 400 K/uL 341 260 319     CMP Latest Ref Rng & Units 12/17/2019 12/06/2019 11/04/2019  Glucose 70 - 99 mg/dL 193(H) 126(H) 109(H)  BUN 8 - 23 mg/dL 31(H) 20 7(L)  Creatinine 0.44 - 1.00 mg/dL 0.79 0.68 0.62  Sodium 135 - 145 mmol/L 141 139 140  Potassium 3.5 - 5.1 mmol/L 4.3 4.3 3.8  Chloride 98 - 111 mmol/L 108 103 107  CO2 22 - 32 mmol/L '23 26 25  '$ Calcium 8.9 - 10.3 mg/dL 10.0 9.3 8.6(L)  Total Protein 6.5 - 8.1 g/dL 7.3 - -   Total Bilirubin 0.3 - 1.2 mg/dL 0.7 - -  Alkaline Phos 38 - 126 U/L 96 - -  AST 15 - 41 U/L 22 - -  ALT 0 - 44 U/L 22 - -      RADIOGRAPHIC STUDIES: I have personally reviewed the radiological images as listed and agreed with the findings in the report. No results found.   ASSESSMENT & PLAN: Kim Castro is a 74 y.o. female with    1.Right colon cancer, pT3N2aM0 stage IIB, MSS -She was diagnosed in 08/2019 with cecal mass biopsy showed moderately differentiated adenocarcinoma. Initial CT AP showed no evidence of lymph node or distant metastasis, or radiographic concern for bowel obstruction.  -CT chest from 10/16/19 did shows multiple small lung nodules that were nonspecific but likely benign. Will monitor.  -She underwent colon surgery with Dr Marcello Moores on 11/01/19. Path showed 5cm of invasive colonic adenocarcinoma that was completely resected, clear margins with 5/13 LNs. Overall Stage IIIB cancer.  -Given the lymph node involvement her recurrence risk is very high, Dr. Morey Hummingbird has recommended adjuvant chemotherapy for 6 months to reduce her recurrence risk.  She previously discussed Capox versus FOLFOX and the patient consented to FOLFOX.  The goal of chemo is curative. -The goal is 6 months of treatment if she can tolerate then proceed with surveillance including CT scan every 6 months for first 2-3 years; will monitor lung nodules -She has recovered very well from surgery, port has been placed.  She attended a chemo teaching session and picked up supportive meds at pharmacy.  She has baseline neuropathy which we will monitor closely on treatment -Begin adjuvant chemo FOLFOX cycle 1 on 12/17/2019  2.Iron deficient anemia -Her 10/03/19 Iron panel showed, Ferritin 13, Iron 34, sat ratios 8. Overall consistent with iron deficiency.  -She did require blood transfusion on 11/06/19 after colon surgery.  -Takes 2 tabs Slow Fe iron daily -Iron studies pending from today, will follow  up  3.H/o left breast DCIS, G2, ER/PR+ -Dx in 05/2014.  S/p left lumpectomy with Dr Excell Seltzer, adjuvant RT with Dr Pablo Ledger. She has been on Anastrozole since 08/2014 for 5 year therapy, tolerating well.  -Followed by Dr Lindi Adie.  -Bone density 06/09/2015 T score +1.1 normal. Plan to repeat DEXA in 2021. -Plan to stop anastrozole in 01/2020 with next follow-up with Dr. Lindi Adie  4. Comorbidities: Arthritis, DM, HTN, GERD -f/u with PCP Dr. Rex Kras  Disposition: Kim Castro appears stable.  She has recovered completely from colon cancer resection.  Her performance status is very high.  CBC and CMP reviewed.  We again reviewed expected side effects and symptom management.  She will proceed with cycle 1 adjuvant FOLFOX as planned today.  I will follow up with her with a phone visit next week for toxicity check.  Follow-up in 2 weeks with cycle 2.  All questions were answered. The patient knows to call the clinic with any problems, questions or concerns. No barriers to learning were detected.     Alla Feeling, NP 12/17/19

## 2019-12-17 ENCOUNTER — Inpatient Hospital Stay: Payer: Medicare Other

## 2019-12-17 ENCOUNTER — Other Ambulatory Visit: Payer: Self-pay

## 2019-12-17 ENCOUNTER — Encounter: Payer: Self-pay | Admitting: Hematology

## 2019-12-17 ENCOUNTER — Encounter: Payer: Self-pay | Admitting: Nurse Practitioner

## 2019-12-17 ENCOUNTER — Inpatient Hospital Stay (HOSPITAL_BASED_OUTPATIENT_CLINIC_OR_DEPARTMENT_OTHER): Payer: Medicare Other | Admitting: Nurse Practitioner

## 2019-12-17 VITALS — BP 123/64 | HR 84 | Temp 97.6°F | Resp 18 | Ht 66.0 in | Wt 243.5 lb

## 2019-12-17 DIAGNOSIS — I700 Atherosclerosis of aorta: Secondary | ICD-10-CM | POA: Diagnosis not present

## 2019-12-17 DIAGNOSIS — C182 Malignant neoplasm of ascending colon: Secondary | ICD-10-CM

## 2019-12-17 DIAGNOSIS — Z5111 Encounter for antineoplastic chemotherapy: Secondary | ICD-10-CM | POA: Diagnosis not present

## 2019-12-17 DIAGNOSIS — C50412 Malignant neoplasm of upper-outer quadrant of left female breast: Secondary | ICD-10-CM | POA: Diagnosis not present

## 2019-12-17 DIAGNOSIS — Z79811 Long term (current) use of aromatase inhibitors: Secondary | ICD-10-CM | POA: Diagnosis not present

## 2019-12-17 DIAGNOSIS — M1990 Unspecified osteoarthritis, unspecified site: Secondary | ICD-10-CM | POA: Diagnosis not present

## 2019-12-17 DIAGNOSIS — Z923 Personal history of irradiation: Secondary | ICD-10-CM | POA: Diagnosis not present

## 2019-12-17 DIAGNOSIS — R918 Other nonspecific abnormal finding of lung field: Secondary | ICD-10-CM | POA: Diagnosis not present

## 2019-12-17 DIAGNOSIS — D509 Iron deficiency anemia, unspecified: Secondary | ICD-10-CM | POA: Diagnosis not present

## 2019-12-17 DIAGNOSIS — Z79899 Other long term (current) drug therapy: Secondary | ICD-10-CM | POA: Diagnosis not present

## 2019-12-17 DIAGNOSIS — Z7984 Long term (current) use of oral hypoglycemic drugs: Secondary | ICD-10-CM | POA: Diagnosis not present

## 2019-12-17 DIAGNOSIS — K219 Gastro-esophageal reflux disease without esophagitis: Secondary | ICD-10-CM | POA: Diagnosis not present

## 2019-12-17 DIAGNOSIS — Z170 Estrogen receptor positive status [ER+]: Secondary | ICD-10-CM | POA: Diagnosis not present

## 2019-12-17 DIAGNOSIS — E1140 Type 2 diabetes mellitus with diabetic neuropathy, unspecified: Secondary | ICD-10-CM | POA: Diagnosis not present

## 2019-12-17 DIAGNOSIS — Z85828 Personal history of other malignant neoplasm of skin: Secondary | ICD-10-CM | POA: Diagnosis not present

## 2019-12-17 DIAGNOSIS — I10 Essential (primary) hypertension: Secondary | ICD-10-CM | POA: Diagnosis not present

## 2019-12-17 LAB — CBC WITH DIFFERENTIAL (CANCER CENTER ONLY)
Abs Immature Granulocytes: 0.06 10*3/uL (ref 0.00–0.07)
Basophils Absolute: 0.1 10*3/uL (ref 0.0–0.1)
Basophils Relative: 1 %
Eosinophils Absolute: 0.2 10*3/uL (ref 0.0–0.5)
Eosinophils Relative: 2 %
HCT: 38.1 % (ref 36.0–46.0)
Hemoglobin: 12.2 g/dL (ref 12.0–15.0)
Immature Granulocytes: 1 %
Lymphocytes Relative: 36 %
Lymphs Abs: 3.5 10*3/uL (ref 0.7–4.0)
MCH: 29 pg (ref 26.0–34.0)
MCHC: 32 g/dL (ref 30.0–36.0)
MCV: 90.7 fL (ref 80.0–100.0)
Monocytes Absolute: 0.7 10*3/uL (ref 0.1–1.0)
Monocytes Relative: 7 %
Neutro Abs: 5.2 10*3/uL (ref 1.7–7.7)
Neutrophils Relative %: 53 %
Platelet Count: 341 10*3/uL (ref 150–400)
RBC: 4.2 MIL/uL (ref 3.87–5.11)
RDW: 17.6 % — ABNORMAL HIGH (ref 11.5–15.5)
WBC Count: 9.7 10*3/uL (ref 4.0–10.5)
nRBC: 0 % (ref 0.0–0.2)

## 2019-12-17 LAB — CMP (CANCER CENTER ONLY)
ALT: 22 U/L (ref 0–44)
AST: 22 U/L (ref 15–41)
Albumin: 3.7 g/dL (ref 3.5–5.0)
Alkaline Phosphatase: 96 U/L (ref 38–126)
Anion gap: 10 (ref 5–15)
BUN: 31 mg/dL — ABNORMAL HIGH (ref 8–23)
CO2: 23 mmol/L (ref 22–32)
Calcium: 10 mg/dL (ref 8.9–10.3)
Chloride: 108 mmol/L (ref 98–111)
Creatinine: 0.79 mg/dL (ref 0.44–1.00)
GFR, Est AFR Am: >60 mL/min (ref >60)
GFR, Estimated: >60 mL/min (ref >60)
Glucose, Bld: 193 mg/dL — ABNORMAL HIGH (ref 70–99)
Potassium: 4.3 mmol/L (ref 3.5–5.1)
Sodium: 141 mmol/L (ref 135–145)
Total Bilirubin: 0.7 mg/dL (ref 0.3–1.2)
Total Protein: 7.3 g/dL (ref 6.5–8.1)

## 2019-12-17 LAB — IRON AND TIBC
Iron: 100 ug/dL (ref 41–142)
Saturation Ratios: 23 % (ref 21–57)
TIBC: 438 ug/dL (ref 236–444)
UIBC: 337 ug/dL (ref 120–384)

## 2019-12-17 LAB — FERRITIN: Ferritin: 36 ng/mL (ref 11–307)

## 2019-12-17 MED ORDER — DEXTROSE 5 % IV SOLN
Freq: Once | INTRAVENOUS | Status: AC
  Filled 2019-12-17: qty 250

## 2019-12-17 MED ORDER — PALONOSETRON HCL INJECTION 0.25 MG/5ML
INTRAVENOUS | Status: AC
  Filled 2019-12-17: qty 5

## 2019-12-17 MED ORDER — SODIUM CHLORIDE 0.9 % IV SOLN
10.0000 mg | Freq: Once | INTRAVENOUS | Status: AC
  Administered 2019-12-17: 10 mg via INTRAVENOUS
  Filled 2019-12-17: qty 10

## 2019-12-17 MED ORDER — PALONOSETRON HCL INJECTION 0.25 MG/5ML
0.2500 mg | Freq: Once | INTRAVENOUS | Status: AC
  Administered 2019-12-17: 0.25 mg via INTRAVENOUS

## 2019-12-17 MED ORDER — FLUOROURACIL CHEMO INJECTION 2.5 GM/50ML
400.0000 mg/m2 | Freq: Once | INTRAVENOUS | Status: AC
  Administered 2019-12-17: 900 mg via INTRAVENOUS
  Filled 2019-12-17: qty 18

## 2019-12-17 MED ORDER — LEUCOVORIN CALCIUM INJECTION 350 MG
400.0000 mg/m2 | Freq: Once | INTRAVENOUS | Status: AC
  Administered 2019-12-17: 904 mg via INTRAVENOUS
  Filled 2019-12-17: qty 45.2

## 2019-12-17 MED ORDER — SODIUM CHLORIDE 0.9 % IV SOLN
2400.0000 mg/m2 | INTRAVENOUS | Status: DC
  Administered 2019-12-17: 5400 mg via INTRAVENOUS
  Filled 2019-12-17: qty 108

## 2019-12-17 MED ORDER — OXALIPLATIN CHEMO INJECTION 100 MG/20ML
85.0000 mg/m2 | Freq: Once | INTRAVENOUS | Status: AC
  Administered 2019-12-17: 190 mg via INTRAVENOUS
  Filled 2019-12-17: qty 38

## 2019-12-17 NOTE — Progress Notes (Signed)
Met with patient in lobby to introduce myself as Arboriculturist and to offer available resources.  Discussed one-time $1000 Radio broadcast assistant to assist with personal expenses while going through treatment.  Gave her my card if interested in applying and for any additional financial questions or concerns.

## 2019-12-17 NOTE — Patient Instructions (Signed)
[facility] Discharge Instructions for Patients Receiving Chemotherapy  Today you received the following chemotherapy agents Oxaliplatin; leucovorin; flourouracil  To help prevent nausea and vomiting after your treatment, we encourage you to take your nausea medication as directed   If you develop nausea and vomiting that is not controlled by your nausea medication, call the clinic.   BELOW ARE SYMPTOMS THAT SHOULD BE REPORTED IMMEDIATELY:  *FEVER GREATER THAN 100.5 F  *CHILLS WITH OR WITHOUT FEVER  NAUSEA AND VOMITING THAT IS NOT CONTROLLED WITH YOUR NAUSEA MEDICATION  *UNUSUAL SHORTNESS OF BREATH  *UNUSUAL BRUISING OR BLEEDING  TENDERNESS IN MOUTH AND THROAT WITH OR WITHOUT PRESENCE OF ULCERS  *URINARY PROBLEMS  *BOWEL PROBLEMS  UNUSUAL RASH Items with * indicate a potential emergency and should be followed up as soon as possible.  Feel free to call the clinic should you have any questions or concerns. The clinic phone number is [phone].  Please show the [facility] at check-in to the Emergency Department and triage nurse.  Oxaliplatin Injection What is this medicine? OXALIPLATIN (ox AL i PLA tin) is a chemotherapy drug. It targets fast dividing cells, like cancer cells, and causes these cells to die. This medicine is used to treat cancers of the colon and rectum, and many other cancers. This medicine may be used for other purposes; ask your health care provider or pharmacist if you have questions. COMMON BRAND NAME(S): Eloxatin What should I tell my health care provider before I take this medicine? They need to know if you have any of these conditions:  heart disease  history of irregular heartbeat  liver disease  low blood counts, like white cells, platelets, or red blood cells  lung or breathing disease, like asthma  take medicines that treat or prevent blood clots  tingling of the fingers or toes, or other nerve disorder  an  unusual or allergic reaction to oxaliplatin, other chemotherapy, other medicines, foods, dyes, or preservatives  pregnant or trying to get pregnant  breast-feeding How should I use this medicine? This drug is given as an infusion into a vein. It is administered in a hospital or clinic by a specially trained health care professional. Talk to your pediatrician regarding the use of this medicine in children. Special care may be needed. Overdosage: If you think you have taken too much of this medicine contact a poison control center or emergency room at once. NOTE: This medicine is only for you. Do not share this medicine with others. What if I miss a dose? It is important not to miss a dose. Call your doctor or health care professional if you are unable to keep an appointment. What may interact with this medicine? Do not take this medicine with any of the following medications:  cisapride  dronedarone  pimozide  thioridazine This medicine may also interact with the following medications:  aspirin and aspirin-like medicines  certain medicines that treat or prevent blood clots like warfarin, apixaban, dabigatran, and rivaroxaban  cisplatin  cyclosporine  diuretics  medicines for infection like acyclovir, adefovir, amphotericin B, bacitracin, cidofovir, foscarnet, ganciclovir, gentamicin, pentamidine, vancomycin  NSAIDs, medicines for pain and inflammation, like ibuprofen or naproxen  other medicines that prolong the QT interval (an abnormal heart rhythm)  pamidronate  zoledronic acid This list may not describe all possible interactions. Give your health care provider a list of all the medicines, herbs, non-prescription drugs, or dietary supplements you use. Also tell them if you smoke, drink alcohol,  or use illegal drugs. Some items may interact with your medicine. What should I watch for while using this medicine? Your condition will be monitored carefully while you are  receiving this medicine. You may need blood work done while you are taking this medicine. This medicine may make you feel generally unwell. This is not uncommon as chemotherapy can affect healthy cells as well as cancer cells. Report any side effects. Continue your course of treatment even though you feel ill unless your healthcare professional tells you to stop. This medicine can make you more sensitive to cold. Do not drink cold drinks or use ice. Cover exposed skin before coming in contact with cold temperatures or cold objects. When out in cold weather wear warm clothing and cover your mouth and nose to warm the air that goes into your lungs. Tell your doctor if you get sensitive to the cold. Do not become pregnant while taking this medicine or for 9 months after stopping it. Women should inform their health care professional if they wish to become pregnant or think they might be pregnant. Men should not father a child while taking this medicine and for 6 months after stopping it. There is potential for serious side effects to an unborn child. Talk to your health care professional for more information. Do not breast-feed a child while taking this medicine or for 3 months after stopping it. This medicine has caused ovarian failure in some women. This medicine may make it more difficult to get pregnant. Talk to your health care professional if you are concerned about your fertility. This medicine has caused decreased sperm counts in some men. This may make it more difficult to father a child. Talk to your health care professional if you are concerned about your fertility. This medicine may increase your risk of getting an infection. Call your health care professional for advice if you get a fever, chills, or sore throat, or other symptoms of a cold or flu. Do not treat yourself. Try to avoid being around people who are sick. Avoid taking medicines that contain aspirin, acetaminophen, ibuprofen, naproxen,  or ketoprofen unless instructed by your health care professional. These medicines may hide a fever. Be careful brushing or flossing your teeth or using a toothpick because you may get an infection or bleed more easily. If you have any dental work done, tell your dentist you are receiving this medicine. What side effects may I notice from receiving this medicine? Side effects that you should report to your doctor or health care professional as soon as possible:  allergic reactions like skin rash, itching or hives, swelling of the face, lips, or tongue  breathing problems  cough  low blood counts - this medicine may decrease the number of white blood cells, red blood cells, and platelets. You may be at increased risk for infections and bleeding  nausea, vomiting  pain, redness, or irritation at site where injected  pain, tingling, numbness in the hands or feet  signs and symptoms of bleeding such as bloody or black, tarry stools; red or dark brown urine; spitting up blood or brown material that looks like coffee grounds; red spots on the skin; unusual bruising or bleeding from the eyes, gums, or nose  signs and symptoms of a dangerous change in heartbeat or heart rhythm like chest pain; dizziness; fast, irregular heartbeat; palpitations; feeling faint or lightheaded; falls  signs and symptoms of infection like fever; chills; cough; sore throat; pain or trouble passing urine  signs and symptoms of liver injury like dark yellow or brown urine; general ill feeling or flu-like symptoms; light-colored stools; loss of appetite; nausea; right upper belly pain; unusually weak or tired; yellowing of the eyes or skin  signs and symptoms of low red blood cells or anemia such as unusually weak or tired; feeling faint or lightheaded; falls  signs and symptoms of muscle injury like dark urine; trouble passing urine or change in the amount of urine; unusually weak or tired; muscle pain; back pain Side  effects that usually do not require medical attention (report to your doctor or health care professional if they continue or are bothersome):  changes in taste  diarrhea  gas  hair loss  loss of appetite  mouth sores This list may not describe all possible side effects. Call your doctor for medical advice about side effects. You may report side effects to FDA at 1-800-FDA-1088. Where should I keep my medicine? This drug is given in a hospital or clinic and will not be stored at home. NOTE: This sheet is a summary. It may not cover all possible information. If you have questions about this medicine, talk to your doctor, pharmacist, or health care provider.  2020 [employer]/Gold Standard (2018-09-05 12:20:35)  Leucovorin injection What is this medicine? LEUCOVORIN (loo koe VOR in) is used to prevent or treat the harmful effects of some medicines. This medicine is used to treat anemia caused by a low amount of folic acid in the body. It is also used with 5-fluorouracil (5-FU) to treat colon cancer. This medicine may be used for other purposes; ask your health care provider or pharmacist if you have questions. What should I tell my health care provider before I take this medicine? They need to know if you have any of these conditions:  anemia from low levels of vitamin B-12 in the blood  an unusual or allergic reaction to leucovorin, folic acid, other medicines, foods, dyes, or preservatives  pregnant or trying to get pregnant  breast-feeding How should I use this medicine? This medicine is for injection into a muscle or into a vein. It is given by a health care professional in a hospital or clinic setting. Talk to your pediatrician regarding the use of this medicine in children. Special care may be needed. Overdosage: If you think you have taken too much of this medicine contact a poison control center or emergency room at once. NOTE: This medicine is only for you. Do not share this  medicine with others. What if I miss a dose? This does not apply. What may interact with this medicine?  capecitabine  fluorouracil  phenobarbital  phenytoin  primidone  trimethoprim-sulfamethoxazole This list may not describe all possible interactions. Give your health care provider a list of all the medicines, herbs, non-prescription drugs, or dietary supplements you use. Also tell them if you smoke, drink alcohol, or use illegal drugs. Some items may interact with your medicine. What should I watch for while using this medicine? Your condition will be monitored carefully while you are receiving this medicine. This medicine may increase the side effects of 5-fluorouracil, 5-FU. Tell your doctor or health care professional if you have diarrhea or mouth sores that do not get better or that get worse. What side effects may I notice from receiving this medicine? Side effects that you should report to your doctor or health care professional as soon as possible:  allergic reactions like skin rash, itching or hives, swelling of the face,  lips, or tongue  breathing problems  fever, infection  mouth sores  unusual bleeding or bruising  unusually weak or tired Side effects that usually do not require medical attention (report to your doctor or health care professional if they continue or are bothersome):  constipation or diarrhea  loss of appetite  nausea, vomiting This list may not describe all possible side effects. Call your doctor for medical advice about side effects. You may report side effects to FDA at 1-800-FDA-1088. Where should I keep my medicine? This drug is given in a hospital or clinic and will not be stored at home. NOTE: This sheet is a summary. It may not cover all possible information. If you have questions about this medicine, talk to your doctor, pharmacist, or health care provider.  2020 [employer]/Gold Standard (2007-10-23 16:50:29)  Fluorouracil, 5-FU  injection What is this medicine? FLUOROURACIL, 5-FU (flure oh YOOR a sil) is a chemotherapy drug. It slows the growth of cancer cells. This medicine is used to treat many types of cancer like breast cancer, colon or rectal cancer, pancreatic cancer, and stomach cancer. This medicine may be used for other purposes; ask your health care provider or pharmacist if you have questions. COMMON BRAND NAME(S): Adrucil What should I tell my health care provider before I take this medicine? They need to know if you have any of these conditions:  blood disorders  dihydropyrimidine dehydrogenase (DPD) deficiency  infection (especially a virus infection such as chickenpox, cold sores, or herpes)  kidney disease  liver disease  malnourished, poor nutrition  recent or ongoing radiation therapy  an unusual or allergic reaction to fluorouracil, other chemotherapy, other medicines, foods, dyes, or preservatives  pregnant or trying to get pregnant  breast-feeding How should I use this medicine? This drug is given as an infusion or injection into a vein. It is administered in a hospital or clinic by a specially trained health care professional. Talk to your pediatrician regarding the use of this medicine in children. Special care may be needed. Overdosage: If you think you have taken too much of this medicine contact a poison control center or emergency room at once. NOTE: This medicine is only for you. Do not share this medicine with others. What if I miss a dose? It is important not to miss your dose. Call your doctor or health care professional if you are unable to keep an appointment. What may interact with this medicine?  allopurinol  cimetidine  dapsone  digoxin  hydroxyurea  leucovorin  levamisole  medicines for seizures like ethotoin, fosphenytoin, phenytoin  medicines to increase blood counts like filgrastim, pegfilgrastim, sargramostim  medicines that treat or prevent blood  clots like warfarin, enoxaparin, and dalteparin  methotrexate  metronidazole  pyrimethamine  some other chemotherapy drugs like busulfan, cisplatin, estramustine, vinblastine  trimethoprim  trimetrexate  vaccines Talk to your doctor or health care professional before taking any of these medicines:  acetaminophen  aspirin  ibuprofen  ketoprofen  naproxen This list may not describe all possible interactions. Give your health care provider a list of all the medicines, herbs, non-prescription drugs, or dietary supplements you use. Also tell them if you smoke, drink alcohol, or use illegal drugs. Some items may interact with your medicine. What should I watch for while using this medicine? Visit your doctor for checks on your progress. This drug may make you feel generally unwell. This is not uncommon, as chemotherapy can affect healthy cells as well as cancer cells. Report any side  effects. Continue your course of treatment even though you feel ill unless your doctor tells you to stop. In some cases, you may be given additional medicines to help with side effects. Follow all directions for their use. Call your doctor or health care professional for advice if you get a fever, chills or sore throat, or other symptoms of a cold or flu. Do not treat yourself. This drug decreases your body's ability to fight infections. Try to avoid being around people who are sick. This medicine may increase your risk to bruise or bleed. Call your doctor or health care professional if you notice any unusual bleeding. Be careful brushing and flossing your teeth or using a toothpick because you may get an infection or bleed more easily. If you have any dental work done, tell your dentist you are receiving this medicine. Avoid taking products that contain aspirin, acetaminophen, ibuprofen, naproxen, or ketoprofen unless instructed by your doctor. These medicines may hide a fever. Do not become pregnant while  taking this medicine. Women should inform their doctor if they wish to become pregnant or think they might be pregnant. There is a potential for serious side effects to an unborn child. Talk to your health care professional or pharmacist for more information. Do not breast-feed an infant while taking this medicine. Men should inform their doctor if they wish to father a child. This medicine may lower sperm counts. Do not treat diarrhea with over the counter products. Contact your doctor if you have diarrhea that lasts more than 2 days or if it is severe and watery. This medicine can make you more sensitive to the sun. Keep out of the sun. If you cannot avoid being in the sun, wear protective clothing and use sunscreen. Do not use sun lamps or tanning beds/booths. What side effects may I notice from receiving this medicine? Side effects that you should report to your doctor or health care professional as soon as possible:  allergic reactions like skin rash, itching or hives, swelling of the face, lips, or tongue  low blood counts - this medicine may decrease the number of white blood cells, red blood cells and platelets. You may be at increased risk for infections and bleeding.  signs of infection - fever or chills, cough, sore throat, pain or difficulty passing urine  signs of decreased platelets or bleeding - bruising, pinpoint red spots on the skin, black, tarry stools, blood in the urine  signs of decreased red blood cells - unusually weak or tired, fainting spells, lightheadedness  breathing problems  changes in vision  chest pain  mouth sores  nausea and vomiting  pain, swelling, redness at site where injected  pain, tingling, numbness in the hands or feet  redness, swelling, or sores on hands or feet  stomach pain  unusual bleeding Side effects that usually do not require medical attention (report to your doctor or health care professional if they continue or are  bothersome):  changes in finger or toe nails  diarrhea  dry or itchy skin  hair loss  headache  loss of appetite  sensitivity of eyes to the light  stomach upset  unusually teary eyes This list may not describe all possible side effects. Call your doctor for medical advice about side effects. You may report side effects to FDA at 1-800-FDA-1088. Where should I keep my medicine? This drug is given in a hospital or clinic and will not be stored at home. NOTE: This sheet is a summary.  It may not cover all possible information. If you have questions about this medicine, talk to your doctor, pharmacist, or health care provider.  2020 [employer] Standard (2007-08-22 13:53:16)

## 2019-12-19 ENCOUNTER — Other Ambulatory Visit: Payer: Self-pay

## 2019-12-19 ENCOUNTER — Inpatient Hospital Stay: Payer: Medicare Other

## 2019-12-19 VITALS — BP 134/74 | HR 84 | Temp 99.0°F

## 2019-12-19 DIAGNOSIS — C182 Malignant neoplasm of ascending colon: Secondary | ICD-10-CM

## 2019-12-19 MED ORDER — HEPARIN SOD (PORK) LOCK FLUSH 100 UNIT/ML IV SOLN
500.0000 [IU] | Freq: Once | INTRAVENOUS | Status: AC | PRN
  Administered 2019-12-19: 500 [IU]
  Filled 2019-12-19: qty 5

## 2019-12-19 MED ORDER — SODIUM CHLORIDE 0.9% FLUSH
10.0000 mL | INTRAVENOUS | Status: DC | PRN
  Administered 2019-12-19: 10 mL
  Filled 2019-12-19: qty 10

## 2019-12-19 NOTE — Patient Instructions (Signed)

## 2019-12-20 ENCOUNTER — Telehealth: Payer: Self-pay | Admitting: Nurse Practitioner

## 2019-12-20 NOTE — Telephone Encounter (Signed)
Scheduled per 8/17 los. Pt is aware of phone visit on 8/24.

## 2019-12-22 NOTE — Progress Notes (Signed)
[place]   [phone] Fax:([phone]   Clinic Follow up Note   Patient Care Team: Hulan Fess, MD as PCP - General (Family Medicine) Excell Seltzer, MD (Inactive) as Consulting Physician (General Surgery) Rolm Bookbinder, MD as Consulting Physician (General Surgery) Thea Silversmith, MD as Consulting Physician (Radiation Oncology) Holley Bouche, NP (Inactive) as Nurse Practitioner (Nurse Practitioner) Truitt Merle, MD as Consulting Physician (Hematology) Jonnie Finner, RN as Oncology Nurse Navigator Leighton Ruff, MD as Consulting Physician (General Surgery) Nicholas Lose, MD as Consulting Physician (Hematology and Oncology) Leighton Ruff, MD as Consulting Physician (General Surgery) 12/22/2019   I connected with [name] on 12/24/19 at 11:37 AM EDT by telephone visit and verified that I am speaking with the correct person using two identifiers.   I discussed the limitations, risks, security and privacy concerns of performing an evaluation and management service by telemedicine and the availability of in-person appointments. I also discussed with the patient that there may be a patient responsible charge related to this service. The patient expressed understanding and agreed to proceed.   Other persons participating in the visit and their role in the encounter: None  Patient's location: Home Provider's location: [place] office   CHIEF COMPLAINT: F/u colon cancer, toxicity check   SUMMARY OF ONCOLOGIC HISTORY: Oncology History Overview Note  Cancer Staging Breast cancer of upper-outer quadrant of left female breast ([place]) Staging form: Breast, AJCC 7th Edition - Clinical stage from 06/11/2014: Stage 0 (Tis (DCIS), N0, M0) - Unsigned - Pathologic stage from 07/03/2014: Stage Unknown (Tis (DCIS), NX, cM0) - Signed by Enid Cutter, MD on 07/10/2014 Staging comments: Staged on final lumpectomy specimen by Dr. Donato Heinz.    Breast  cancer of upper-outer quadrant of left female breast ([place])  05/29/2014 Initial Biopsy   Left breast needle core biopsy: Grade 2, DCIS with calcs. ER+ (100%), PR+ (96%).    06/04/2014 Initial Diagnosis   Left breast DCIS with calcifications, ER 100%, PR 96%   06/10/2014 Breast MRI   Left breast: 2.4 x 1.3 x 1.1 cm area of patchy non-mass enhancement upper outer quadrant includes postbiopsy seroma; Right breast: 1.2 cm previously biopsied stable benign fibroadenoma   06/12/2014 Procedure   Genetic counseling/testing: Identified 1 VUS on CHEK2 gene. Remainder of 17 gene panel tested negative and included: ATM, BARD1, BRCA 1/2, BRIP1, CDH1, CHEK2, EPCAM, MLH1, MSH2, MSH6, NBN, NF1, PALB2, PTEN, RAD50, RAD51C, RAD51D, STK11, and TP53.    07/01/2014 Surgery   Left breast lumpectomy ([facility]): Grade 1, DCIS, spanning 2.3 cm, 1 mm margin, ER 100%, PR 96%   07/31/2014 - 08/28/2014 Radiation Therapy   Adjuvant RT completed Pablo Ledger). Left breast: Total dose 42.5 Gy over 17 fractions. Left breast boost: Total dose 7.5 Gy over 3 fractions.    09/14/2014 -  Anti-estrogen oral therapy   Anastrazole $RemoveBefo'1mg'PKvTpojpDLp$  daily. Planned duration of treatment: 5 years Lindi Adie)   09/25/2014 Survivorship   Survivorship Care Plan given to patient and reviewed with her in person.    right colon cancer  09/19/2019 Imaging   CT AP W contrast 09/19/19  IMPRESSION Fullness in the cecum, cannot exclude a mass. No evidence for metastatic disease is identified.    09/24/2019 Procedure   Colonoscopy by Dr Eber Jones 09/24/19 IMPRESSION 1. The colon was redundant  2. Mild diverticulosis was noted through the entire examined colon 3. Single 91mm polyp was found in the ascending colon; polypectomy was performed using snare cautery and biopsy forceps 4. Mild diverticulosis  was notes in the descending colon and sigmoid colon.  5. Single polyp was found in the sigmoid colon, polypectomy was performed with cold forceps.  6. Single polyp was found  in the rectosigmoid colon; polypectomy was performed with cold snare  7. Small internal hemorrhoids  8. Large mass was found at the cecum; multiple biopsies of the area were performed using cold forceps; injection (tattooing) was performed distal to the mass.    09/24/2019 Initial Biopsy   INTERPRETATION AND DIAGNOSIS:  A. Cecum, biopsy:  Invasive moderately differentiated adenocarcinoma.  see comment  B. Polyp @ ascending colon, polypectomy:  Tubular Adenoma  C. Polyp @ sigmoid colon Polypectomy:  hyperplastic polyp.  D. Polyp @ rectosigmoid colon, Polypectomy:  Hyperplastic Polyp      10/16/2019 Imaging   CT Chest IMPRESSION: 1. Multiple small pulmonary nodules measuring 5 mm or less in size in the lungs. These are nonspecific and are typically considered statistically likely benign. However, given the patient's history of primary malignancy, close attention on follow-up studies is recommended to ensure stability. 2. Aortic atherosclerosis, in addition to right coronary artery disease. Assessment for potential risk factor modification, dietary therapy or pharmacologic therapy may be warranted, if clinically indicated. 3. There are calcifications of the aortic valve and mitral annulus. Echocardiographic correlation for evaluation of potential valvular dysfunction may be warranted if clinically indicated. 4. Small hiatal hernia.   Aortic Atherosclerosis (ICD10-I70.0).   11/01/2019 Initial Diagnosis   Colon cancer ([place])   11/01/2019 Surgery   LAPAROSCOPIC PARTIAL COLECTOMY by Dr Marcello Moores and Dr Johney Maine   11/01/2019 Pathology Results   FINAL MICROSCOPIC DIAGNOSIS:   A. COLON, PROXIMAL RIGHT, COLECTOMY:  - Invasive colonic adenocarcinoma, 5 cm.  - Tumor invades through the muscularis propria into pericolonic tissues.   - Margins of resection are not involved.  - Metastatic carcinoma in (5) of (13) lymph nodes.  - See oncology table.    MSI Stable  Mismatch repair normal   MLH1 - Preserved nuclear expression (greater 50% tumor expression) MSH2 - Preserved nuclear expression (greater 50% tumor expression) MSH6 - Preserved nuclear expression (greater 50% tumor expression) PMS2 - Preserved nuclear expression (greater 50% tumor expression)   11/01/2019 Cancer Staging   Staging form: Colon and Rectum, AJCC 8th Edition - Pathologic stage from 11/01/2019: Stage IIIB (pT3, pN2a, cM0) - Signed by Truitt Merle, MD on 11/29/2019   12/17/2019 -  Chemotherapy   The patient had dexamethasone (DECADRON) 4 MG tablet, 8 mg, Oral, Daily, 1 of 1 cycle, Start date: --, End date: -- palonosetron (ALOXI) injection 0.25 mg, 0.25 mg, Intravenous,  Once, 1 of 12 cycles Administration: 0.25 mg (12/17/2019) leucovorin 904 mg in dextrose 5 % 250 mL infusion, 400 mg/m2 = 904 mg, Intravenous,  Once, 1 of 12 cycles Administration: 904 mg (12/17/2019) oxaliplatin (ELOXATIN) 190 mg in dextrose 5 % 500 mL chemo infusion, 85 mg/m2 = 190 mg, Intravenous,  Once, 1 of 12 cycles Administration: 190 mg (12/17/2019) fluorouracil (ADRUCIL) chemo injection 900 mg, 400 mg/m2 = 900 mg, Intravenous,  Once, 1 of 12 cycles Administration: 900 mg (12/17/2019) fluorouracil (ADRUCIL) 5,400 mg in sodium chloride 0.9 % 142 mL chemo infusion, 2,400 mg/m2 = 5,400 mg, Intravenous, 1 Day/Dose, 1 of 12 cycles Administration: 5,400 mg (12/17/2019)  for chemotherapy treatment.      CURRENT THERAPY: Adjuvant FOLFOX q2 weeks starting 12/17/19; goal 6 months therapy   INTERVAL HISTORY: Ms. Ihrig presents by phone for toxicity check. Energy is adequate, she has been out  in the yard and active. She is doing "quite well." she remains sensitive to cold but avoids exposures and prefers room temp food/drink anyways. She has some tingling in 1 finger of left hand and stable in her feet. She functions well. She has a sore on her tongue which also happens when she eats salty things and had peanuts yesterday. Po intake is not limited by this.  For past 3 days she has little small amount of "runny" stool with each void. She attributes this to iron, it has the opposite effect on her. Denies rectal bleeding, pain, rash, fever, chills, n/v, cough, chest pain, dyspnea. Her leg edema resolved. She started using flonase for hay fever   MEDICAL HISTORY:  Past Medical History:  Diagnosis Date  . Aortic atherosclerosis ([place])   . Arthritis    feet, lower back  . Basal cell carcinoma    arm  . Breast cancer of upper-outer quadrant of left female breast ([place]) 06/04/2014  . Cataract    immature on the left  . Colon cancer ([place]) 08/2019  . Diabetes mellitus without complication ([place])   . Diverticulosis   . Dizziness    > 70yrs ago;took Antivert   . Family history of anesthesia complication    sister slow to wake up with anesthesia  . Family history of breast cancer   . Family history of colon cancer   . Family history of uterine cancer   . GERD (gastroesophageal reflux disease)    takes occasional TUMs  . History of bronchitis    > 78yrs ago  . History of colon polyps   . History of hiatal hernia    Small noted on CT  . Hypertension    takes Losartan daily and HCTZ  . Iron deficiency anemia   . Joint pain   . Numbness    to toes on each foot  . Peripheral neuropathy    feet and toes  . Personal history of radiation therapy   . Pulmonary nodules    Noted on CT  . Radiation 07/31/14-08/28/14   Left Breast 20 fxs  . Seasonal allergies    takes Claritin prn  . Urinary frequency   . Vitamin D deficiency    takes VIt D daily    SURGICAL HISTORY: Past Surgical History:  Procedure Laterality Date  . BREAST BIOPSY Bilateral   . BREAST LUMPECTOMY Left   . BREAST LUMPECTOMY WITH RADIOACTIVE SEED LOCALIZATION Left 07/01/2014   Procedure: LEFT BREAST LUMPECTOMY WITH RADIOACTIVE SEED LOCALIZATION;  Surgeon: Excell Seltzer, MD;  Location: [facility];  Service: General;  Laterality: Left;  . CATARACT EXTRACTION Right    . COLONOSCOPY    . LAPAROSCOPIC PARTIAL COLECTOMY N/A 11/01/2019   Procedure: LAPAROSCOPIC PARTIAL COLECTOMY;  Surgeon: Leighton Ruff, MD;  Location: [facility];  Service: General;  Laterality: N/A;  . PORTACATH PLACEMENT N/A 12/10/2019   Procedure: INSERTION PORT-A-CATH ULTRASOUND GUIDED IN RIGHT IJ;  Surgeon: Leighton Ruff, MD;  Location: [facility];  Service: General;  Laterality: N/A;  . TOTAL KNEE ARTHROPLASTY Left 10/24/2012   Procedure: TOTAL KNEE ARTHROPLASTY;  Surgeon: Kerin Salen, MD;  Location: [facility];  Service: Orthopedics;  Laterality: Left;  . TOTAL KNEE ARTHROPLASTY Right 01/09/2013   Procedure: TOTAL KNEE ARTHROPLASTY;  Surgeon: Kerin Salen, MD;  Location: [facility];  Service: Orthopedics;  Laterality: Right;  . TUBAL LIGATION      I have reviewed the social history and family history with the patient and  they are unchanged from previous note.  ALLERGIES:  is allergic to oxycodone.  MEDICATIONS:  Current Outpatient Medications  Medication Sig Dispense Refill  . acetaminophen (TYLENOL) 500 MG tablet Take 2 tablets (1,000 mg total) by mouth every 6 (six) hours as needed. (Patient not taking: Reported on 12/03/2019) 30 tablet 0  . anastrozole (ARIMIDEX) 1 MG tablet Take 1 tablet by mouth once daily 30 tablet 0  . aspirin EC 81 MG tablet Take 81 mg by mouth daily. Swallow whole.    . Cholecalciferol (VITAMIN D) 2000 UNITS CAPS Take 2,000 Units by mouth daily.     . Ferrous Sulfate (SLOW FE PO) Take 1 tablet by mouth in the morning and at bedtime.     Marland Kitchen ibuprofen (ADVIL) 200 MG tablet Take 400 mg by mouth every 6 (six) hours as needed for moderate pain.     Marland Kitchen lidocaine-prilocaine (EMLA) cream Apply to affected area once (Patient taking differently: Apply 1 application topically daily as needed (port access). ) 30 g 3  . loratadine (CLARITIN) 10 MG tablet Take 10 mg by mouth daily as needed for allergies.    Marland Kitchen losartan (COZAAR) 25 MG tablet Take 25 mg by mouth daily.    . metFORMIN  (GLUCOPHAGE) 500 MG tablet Take 1 tablet (500 mg total) by mouth daily with breakfast. (Patient taking differently: Take 500 mg by mouth in the morning and at bedtime. )    . Multiple Vitamins-Minerals (MULTIVITAMIN WITH MINERALS) tablet Take 1 tablet by mouth daily.    Marland Kitchen omeprazole (PRILOSEC) 20 MG capsule Take 20 mg by mouth daily before breakfast.     . ondansetron (ZOFRAN) 8 MG tablet Take 1 tablet (8 mg total) by mouth 2 (two) times daily as needed for refractory nausea / vomiting. Start on day 3 after chemotherapy. 30 tablet 1  . pravastatin (PRAVACHOL) 10 MG tablet Take 10 mg by mouth daily.    . prochlorperazine (COMPAZINE) 10 MG tablet Take 1 tablet (10 mg total) by mouth every 6 (six) hours as needed (Nausea or vomiting). 30 tablet 1   No current facility-administered medications for this visit.    PHYSICAL EXAMINATION: ECOG PERFORMANCE STATUS: 1 - Symptomatic but completely ambulatory  There were no vitals filed for this visit. There were no vitals filed for this visit.  Patient appears well over the phone. She is alert and oriented, speech is clear and intact. No cough or conversational dyspnea.  LABORATORY DATA:  No labs filed for this visit.     RADIOGRAPHIC STUDIES: I have personally reviewed the radiological images as listed and agreed with the findings in the report. No results found.   ASSESSMENT & PLAN: [facility] a 74 y.o.femalewith    1.Right colon cancer, pT3N2aM0 stage IIB, MSS -She was diagnosed in 08/2019 withcecal mass biopsy showed moderately differentiated adenocarcinoma. InitialCTAPshowed no evidence of lymph node or distant metastasis, or radiographic concern for bowel obstruction. -CT chest from 10/16/19 did shows multiple small lung nodules that were nonspecific but likely benign. Will monitor.  -She underwent colon surgery with Dr Marcello Moores on 11/01/19. Path showed5cm of invasive colonic adenocarcinoma that was completely resected,  clear margins with 5/13 LNs. Overall Stage IIIB cancer.  -Given the lymph node involvement her recurrence risk is very high, Dr. Morey Hummingbird has recommended adjuvant chemotherapy for 6 months to reduce her recurrence risk.  She previously discussed Capox versus FOLFOX and the patient consented to FOLFOX.  The goal of chemo is curative. -The goal is  6 months of treatment if she can tolerate then proceed with surveillance including CT scan every 6 months for first 2-3 years; will monitor lung nodules -She has recovered very well from surgery. Her baseline neuropathy will be monitor closely on treatment -Began adjuvant chemo FOLFOX cycle 1 on 12/17/2019  2.Iron deficient anemia -Her 10/03/19 Iron panel showed, Ferritin 13, Iron 34, sat ratios 8. Overall consistent with iron deficiency. -She did require blood transfusion on 11/06/19 after colon surgery.  -Takes 2 tabs Slow Fe iron daily -Iron studies improved on 12/17/2019  3.H/o left breast DCIS, G2, ER/PR+ -Dx in 05/2014.  S/p left lumpectomy with Dr Excell Seltzer, adjuvant RT with Dr Pablo Ledger. She has been on Anastrozole since 08/2014 for 5 year therapy, tolerating well.  -Followed by Dr Lindi Adie.  -Bone density 06/09/2015 T score +1.1 normal. Plan to repeat DEXA in 2021. -Plan to stop anastrozole in 01/2020 with next follow-up with Dr. Lindi Adie  4. Comorbidities: Arthritis, DM, HTN, GERD -f/u with PCP Dr. Rex Kras  Disposition: Ms. Hoerner appears stable. She is day 8 of cycle 1 adjuvant FOLFOX. She is doing well with mild cold sensitivity and loose stool. I recommend for her to begin Imodium as needed. She has a mouth sore which is either from chemotherapy or salty foods. I recommend for her to start warm salt and soda rinse.   She is otherwise doing very well without significant toxicities. I do not feel she needs in- person evaluation or supportive care at this time.  We reviewed signs and symptoms to call and report. She knows to return on 8/30 for  follow-up and cycle 2, or sooner if she develops new or worsening side effects in the interim.  All questions were answered. The patient knows to call the clinic with any problems, questions or concerns. No barriers to learning were detected. Total time spent in today's encounter was 15 minutes.      Alla Feeling, NP 12/22/19

## 2019-12-23 ENCOUNTER — Telehealth: Payer: Self-pay | Admitting: Hematology

## 2019-12-24 ENCOUNTER — Inpatient Hospital Stay (HOSPITAL_BASED_OUTPATIENT_CLINIC_OR_DEPARTMENT_OTHER): Payer: Medicare Other | Admitting: Nurse Practitioner

## 2019-12-24 ENCOUNTER — Encounter: Payer: Self-pay | Admitting: Nurse Practitioner

## 2019-12-24 DIAGNOSIS — C182 Malignant neoplasm of ascending colon: Secondary | ICD-10-CM

## 2019-12-26 ENCOUNTER — Telehealth: Payer: Self-pay | Admitting: Nurse Practitioner

## 2019-12-26 NOTE — Telephone Encounter (Signed)
No 8/24 los  

## 2019-12-27 NOTE — Progress Notes (Signed)
Kim Castro   [phone] [phone]   Clinic Follow up Note   Patient Care Team: Hulan Fess, MD as PCP - General (Family Medicine) Excell Seltzer, MD (Inactive) as Consulting Physician (General Surgery) Rolm Bookbinder, MD as Consulting Physician (General Surgery) Thea Silversmith, MD as Consulting Physician (Radiation Oncology) Holley Bouche, NP (Inactive) as Nurse Practitioner (Nurse Practitioner) Truitt Merle, MD as Consulting Physician (Hematology) Jonnie Finner, RN as Oncology Nurse Navigator Leighton Ruff, MD as Consulting Physician (General Surgery) Nicholas Lose, MD as Consulting Physician (Hematology and Oncology) Leighton Ruff, MD as Consulting Physician (General Surgery)  Date of Service:  12/30/2019  CHIEF COMPLAINT: F/u of colon cancer   SUMMARY OF ONCOLOGIC HISTORY: Oncology History Overview Note  Cancer Staging Breast cancer of upper-outer quadrant of left female breast [facility]) Staging form: Breast, AJCC 7th Edition - Clinical stage from 06/11/2014: Stage 0 (Tis (DCIS), N0, M0) - Unsigned - Pathologic stage from 07/03/2014: Stage Unknown (Tis (DCIS), NX, cM0) - Signed by Enid Cutter, MD on 07/10/2014 Staging comments: Staged on final lumpectomy specimen by Dr. Donato Heinz.  right colon cancer Staging form: Colon and Rectum, AJCC 8th Edition - Pathologic stage from 11/01/2019: Stage IIIB (pT3, pN2a, cM0) - Signed by Truitt Merle, MD on 11/29/2019    Breast cancer of upper-outer quadrant of left female breast ([place])  05/29/2014 Initial Biopsy   Left breast needle core biopsy: Grade 2, DCIS with calcs. ER+ (100%), PR+ (96%).    06/04/2014 Initial Diagnosis   Left breast DCIS with calcifications, ER 100%, PR 96%   06/10/2014 Breast MRI   Left breast: 2.4 x 1.3 x 1.1 cm area of patchy non-mass enhancement upper outer quadrant includes postbiopsy seroma; Right breast: 1.2 cm previously biopsied stable benign fibroadenoma   06/12/2014  Procedure   Genetic counseling/testing: Identified 1 VUS on CHEK2 gene. Remainder of 17 gene panel tested negative and included: ATM, BARD1, BRCA 1/2, BRIP1, CDH1, CHEK2, EPCAM, MLH1, MSH2, MSH6, NBN, NF1, PALB2, PTEN, RAD50, RAD51C, RAD51D, STK11, and TP53.    07/01/2014 Surgery   Left breast lumpectomy ([place]): Grade 1, DCIS, spanning 2.3 cm, 1 mm margin, ER 100%, PR 96%   07/31/2014 - 08/28/2014 Radiation Therapy   Adjuvant RT completed Pablo Ledger). Left breast: Total dose 42.5 Gy over 17 fractions. Left breast boost: Total dose 7.5 Gy over 3 fractions.    09/14/2014 -  Anti-estrogen oral therapy   Anastrazole 41m daily. Planned duration of treatment: 5 years (Lindi Adie   09/25/2014 Survivorship   Survivorship Care Plan given to patient and reviewed with her in person.    right colon cancer  09/19/2019 Imaging   CT AP W contrast 09/19/19  IMPRESSION Fullness in the cecum, cannot exclude a mass. No evidence for metastatic disease is identified.    09/24/2019 Procedure   Colonoscopy by Dr MEber Jones5/25/21 IMPRESSION 1. The colon was redundant  2. Mild diverticulosis was noted through the entire examined colon 3. Single 181mpolyp was found in the ascending colon; polypectomy was performed using snare cautery and biopsy forceps 4. Mild diverticulosis was notes in the descending colon and sigmoid colon.  5. Single polyp was found in the sigmoid colon, polypectomy was performed with cold forceps.  6. Single polyp was found in the rectosigmoid colon; polypectomy was performed with cold snare  7. Small internal hemorrhoids  8. Large mass was found at the cecum; multiple biopsies of the area were performed using cold forceps; injection (tattooing) was performed distal to the  mass.    09/24/2019 Initial Biopsy   INTERPRETATION AND DIAGNOSIS:  A. Cecum, biopsy:  Invasive moderately differentiated adenocarcinoma.  see comment  B. Polyp @ ascending colon, polypectomy:  Tubular Adenoma  C. Polyp @  sigmoid colon Polypectomy:  hyperplastic polyp.  D. Polyp @ rectosigmoid colon, Polypectomy:  Hyperplastic Polyp      10/16/2019 Imaging   CT Chest IMPRESSION: 1. Multiple small pulmonary nodules measuring 5 mm or less in size in the lungs. These are nonspecific and are typically considered statistically likely benign. However, given the patient's history of primary malignancy, close attention on follow-up studies is recommended to ensure stability. 2. Aortic atherosclerosis, in addition to right coronary artery disease. Assessment for potential risk factor modification, dietary therapy or pharmacologic therapy may be warranted, if clinically indicated. 3. There are calcifications of the aortic valve and mitral annulus. Echocardiographic correlation for evaluation of potential valvular dysfunction may be warranted if clinically indicated. 4. Small hiatal hernia.   Aortic Atherosclerosis (ICD10-I70.0).   11/01/2019 Initial Diagnosis   Colon cancer ([place])   11/01/2019 Surgery   LAPAROSCOPIC PARTIAL COLECTOMY by Dr Marcello Moores and Dr Johney Maine   11/01/2019 Pathology Results   FINAL MICROSCOPIC DIAGNOSIS:   A. COLON, PROXIMAL RIGHT, COLECTOMY:  - Invasive colonic adenocarcinoma, 5 cm.  - Tumor invades through the muscularis propria into pericolonic tissues.   - Margins of resection are not involved.  - Metastatic carcinoma in (5) of (13) lymph nodes.  - See oncology table.    MSI Stable  Mismatch repair normal  MLH1 - Preserved nuclear expression (greater 50% tumor expression) MSH2 - Preserved nuclear expression (greater 50% tumor expression) MSH6 - Preserved nuclear expression (greater 50% tumor expression) PMS2 - Preserved nuclear expression (greater 50% tumor expression)   11/01/2019 Cancer Staging   Staging form: Colon and Rectum, AJCC 8th Edition - Pathologic stage from 11/01/2019: Stage IIIB (pT3, pN2a, cM0) - Signed by Truitt Merle, MD on 11/29/2019   12/10/2019 Procedure   PAC  placed 12/10/19   12/17/2019 -  Chemotherapy   FOLFOX q2weeks starting in 2 weeks starting 12/17/19      CURRENT THERAPY:  FOLFOX q2weeks starting in 2 weeks starting 12/17/19  INTERVAL HISTORY:  Kim Castro is here for a follow up and treatment. She presents to the clinic alone. She notes her first cycle went well. She has very mild cold sensitivity and has much improved this week. She denies nausea. She had diarrhea for 3 days which resolved with medication. She is interested in [place] booster vaccine.    REVIEW OF SYSTEMS:   Constitutional: Denies fevers, chills or abnormal weight loss Eyes: Denies blurriness of vision Ears, nose, mouth, throat, and face: Denies mucositis or sore throat Respiratory: Denies cough, dyspnea or wheezes Cardiovascular: Denies palpitation, chest discomfort or lower extremity swelling Gastrointestinal:  Denies nausea, heartburn or change in bowel habits Skin: Denies abnormal skin rashes Lymphatics: Denies new lymphadenopathy or easy bruising Neurological: (+) Cold sensitivity Behavioral/Psych: Mood is stable, no new changes  All other systems were reviewed with the patient and are negative.  MEDICAL HISTORY:  Past Medical History:  Diagnosis Date  . Aortic atherosclerosis ([place])   . Arthritis    feet, lower back  . Basal cell carcinoma    arm  . Breast cancer of upper-outer quadrant of left female breast ([place]) 06/04/2014  . Cataract    immature on the left  . Colon cancer ([place]) 08/2019  . Diabetes mellitus without complication ([employer])   .  Diverticulosis   . Dizziness    > 74yr ago;took Antivert   . Family history of anesthesia complication    sister slow to wake up with anesthesia  . Family history of breast cancer   . Family history of colon cancer   . Family history of uterine cancer   . GERD (gastroesophageal reflux disease)    takes occasional TUMs  . History of bronchitis    > 89yr[age]  . History of colon polyps   . History of  hiatal hernia    Small noted on CT  . Hypertension    takes Losartan daily and HCTZ  . Iron deficiency anemia   . Joint pain   . Numbness    to toes on each foot  . Peripheral neuropathy    feet and toes  . Personal history of radiation therapy   . Pulmonary nodules    Noted on CT  . Radiation 07/31/14-08/28/14   Left Breast 20 fxs  . Seasonal allergies    takes Claritin prn  . Urinary frequency   . Vitamin D deficiency    takes VIt D daily    SURGICAL HISTORY: Past Surgical History:  Procedure Laterality Date  . BREAST BIOPSY Bilateral   . BREAST LUMPECTOMY Left   . BREAST LUMPECTOMY WITH RADIOACTIVE SEED LOCALIZATION Left 07/01/2014   Procedure: LEFT BREAST LUMPECTOMY WITH RADIOACTIVE SEED LOCALIZATION;  Surgeon: BeExcell SeltzerMD;  [facility] Service: General;  Laterality: Left;  . CATARACT EXTRACTION Right   . COLONOSCOPY    . LAPAROSCOPIC PARTIAL COLECTOMY N/A 11/01/2019   Procedure: LAPAROSCOPIC PARTIAL COLECTOMY;  Surgeon: ThLeighton RuffMD;  Location: [facility];  Service: General;  Laterality: N/A;  . PORTACATH PLACEMENT N/A 12/10/2019   Procedure: INSERTION PORT-A-CATH ULTRASOUND GUIDED IN RIGHT IJ;  Surgeon: ThLeighton RuffMD;  Location: [facility];  Service: General;  Laterality: N/A;  . TOTAL KNEE ARTHROPLASTY Left 10/24/2012   Procedure: TOTAL KNEE ARTHROPLASTY;  Surgeon: FrKerin SalenMD;  [facility] Service: Orthopedics;  Laterality: Left;  . TOTAL KNEE ARTHROPLASTY Right 01/09/2013   Procedure: TOTAL KNEE ARTHROPLASTY;  Surgeon: FrKerin SalenMD;  [facility]: [facility]ics;  Laterality: Right;  . TUBAL LIGATION      I have reviewed the social history and family history with the patient and they are unchanged from previous note.  ALLERGIES:  is allergic to oxycodone.  MEDICATIONS:  Current Outpatient Medications  Medication Sig Dispense Refill  . acetaminophen (TYLENOL) 500 MG tablet Take 2 tablets (1,000 mg total)  by mouth every 6 (six) hours as needed. (Patient not taking: Reported on 12/03/2019) 30 tablet 0  . anastrozole (ARIMIDEX) 1 MG tablet Take 1 tablet by mouth once daily 30 tablet 0  . aspirin EC 81 MG tablet Take 81 mg by mouth daily. Swallow whole.    . Cholecalciferol (VITAMIN D) 2000 UNITS CAPS Take 2,000 Units by mouth daily.     . Ferrous Sulfate (SLOW FE PO) Take 1 tablet by mouth in the morning and at bedtime.     . Marland Kitchenbuprofen (ADVIL) 200 MG tablet Take 400 mg by mouth every 6 (six) hours as needed for moderate pain.     . Marland Kitchenidocaine-prilocaine (EMLA) cream Apply to affected area once (Patient taking differently: Apply 1 application topically daily as needed (port access). ) 30 g 3  . loratadine (CLARITIN) 10 MG tablet Take 10 mg by mouth daily as needed for  allergies.    Marland Kitchen losartan (COZAAR) 25 MG tablet Take 25 mg by mouth daily.    . metFORMIN (GLUCOPHAGE) 500 MG tablet Take 1 tablet (500 mg total) by mouth daily with breakfast. (Patient taking differently: Take 500 mg by mouth in the morning and at bedtime. )    . Multiple Vitamins-Minerals (MULTIVITAMIN WITH MINERALS) tablet Take 1 tablet by mouth daily.    Marland Kitchen omeprazole (PRILOSEC) 20 MG capsule Take 20 mg by mouth daily before breakfast.     . ondansetron (ZOFRAN) 8 MG tablet Take 1 tablet (8 mg total) by mouth 2 (two) times daily as needed for refractory nausea / vomiting. Start on day 3 after chemotherapy. 30 tablet 1  . pravastatin (PRAVACHOL) 10 MG tablet Take 10 mg by mouth daily.    . prochlorperazine (COMPAZINE) 10 MG tablet Take 1 tablet (10 mg total) by mouth every 6 (six) hours as needed (Nausea or vomiting). 30 tablet 1   No current facility-administered medications for this visit.   Facility-Administered Medications Ordered in Other Visits  Medication Dose Route Frequency Provider Last Rate Last Admin  . fluorouracil (ADRUCIL) 5,400 mg in sodium chloride 0.9 % 142 mL chemo infusion  2,400 mg/m2 (Treatment Plan Recorded)  Intravenous 1 day or 1 dose Truitt Merle, MD   5,400 mg at 12/30/19 1627    PHYSICAL EXAMINATION: ECOG PERFORMANCE STATUS: 1 - Symptomatic but completely ambulatory  Vitals with BMI 12/30/2019  Height _0   Weight 243 lbs 13 oz  BMI 81.01  Systolic 751  Diastolic 60  Pulse 86    Due to COVID19 we will limit examination to appearance. Patient had no complaints.  GENERAL:alert, no distress and comfortable SKIN: skin color normal, no rashes or significant lesions EYES: normal, Conjunctiva are pink and non-injected, sclera clear  NEURO: alert & oriented x 3 with fluent speech   LABORATORY DATA:  I have reviewed the data as listed CBC Latest Ref Rng & Units 12/30/2019 12/17/2019 12/06/2019  WBC 4.0 - 10.5 K/uL 5.5 9.7 8.0  Hemoglobin 12.0 - 15.0 g/dL 11.2(L) 12.2 12.4  Hematocrit 36 - 46 % 34.2(L) 38.1 38.9  Platelets 150 - 400 K/uL 220 341 260     CMP Latest Ref Rng & Units 12/30/2019 12/17/2019 12/06/2019  Glucose 70 - 99 mg/dL 137(H) 193(H) 126(H)  BUN 8 - 23 mg/dL 16 31(H) 20  Creatinine 0.44 - 1.00 mg/dL 0.76 0.79 0.68  Sodium 135 - 145 mmol/L 141 141 139  Potassium 3.5 - 5.1 mmol/L 3.7 4.3 4.3  Chloride 98 - 111 mmol/L 109 108 103  CO2 22 - 32 mmol/L _1 Calcium 8.9 - 10.3 mg/dL 9.7 10.0 9.3  Total Protein 6.5 - 8.1 g/dL 6.9 7.3 -  Total Bilirubin 0.3 - 1.2 mg/dL 0.8 0.7 -  Alkaline Phos 38 - 126 U/L 125 96 -  AST 15 - 41 U/L 44(H) 22 -  ALT 0 - 44 U/L 41 22 -      RADIOGRAPHIC STUDIES: I have personally reviewed the radiological images as listed and agreed with the findings in the report. No results found.   ASSESSMENT & PLAN:  Kim Castro is a 74 y.o. female with    1.Right colon cancer, pT3N2aM0 stage IIB, MSS -She was diagnosed in 08/2019 with cecal mass biopsy showed moderately differentiated adenocarcinoma. Initial CT AP showed no evidence of lymph node or distant metastasis, or radiographic concern for bowel obstruction.  -CT chest from 10/16/19  did  shows multiple small lung nodules that were nonspecific but likely benign. Will monitor.  -She underwent colon surgery with Dr Marcello Moores on 11/01/19. Her path showed 5cm of invasive colonic adenocarcinoma that was completely resected, clear margins with 5/13 LNs. Overall Stage IIIB cancer.  -I started her on adjuvant chemotherapy for 6 months to reduce her risk of recurrence with FOLFOX every 2 weeks beginning 12/17/19. Given her baseline neuropathy, If we need to stop Oxaliplatin early on, I can switch her 5FU to Oral Xeloda single agent. -She tolerated first cycle chemo well with manageable and mild cold sensitivity and diarrhea. Labs reviewed, CBC And CMP WNL except Hg 11.2, BG 137, AST 44. CEA is still pending. Overall adequate to proceed with C2 FOLFOX today.  -F/u in 2 weeks.  -She is interested in COVID booster vaccine. Will schedule for next week.   2.Iron deficient anemia -Her 10/03/19 Iron panel showed, Ferritin 13, Iron 34, sat ratios 8. Overall consistent with iron deficiency. -She did require blood transfusion on 11/06/19 after colon surgery.  -Currently on 2 tabs Slow Fe iron daily -Will monitor on chemotherapy.  -Today Hg 11.2, Iron 48, Ferritin 79 (12/30/19), mild anemia is related to chemo   3.H/o left breast DCIS, G2, ER/PR+ -Dx in 05/2014. Treated with left lumpectomy with Dr Excell Seltzer, adjuvant RT with Dr Pablo Ledger. She has been on Anastrozole since 08/2014 for 5 year therapy. He has been followed by Dr Lindi Adie.  -Bone density 06/09/2015 T score +1.1 normal. Plan to repeat DEXA in 2021. -Plan to stop anastrozole in 01/2020 with next follow-up with Dr. Lindi Adie  4. Comorbidities: Arthritis, DM, HTN, GERD -f/u with PCP Dr. Rex Kras   PLAN: -Labs reviewed and adequate to proceed with C2 FOLFOX today  -Lab, flush, F/u and FOLFOX in 2, 4, 6 weeks  -COVID booster vaccine next week    No problem-specific Assessment & Plan notes found for this encounter.   No orders of the  defined types were placed in this encounter.  All questions were answered. The patient knows to call the clinic with any problems, questions or concerns. No barriers to learning was detected. The total time spent in the appointment was 30 minutes.     Truitt Merle, MD 12/30/2019   I, Joslyn Devon, am acting as scribe for Truitt Merle, MD.   I have reviewed the above documentation for accuracy and completeness, and I agree with the above.

## 2019-12-30 ENCOUNTER — Other Ambulatory Visit: Payer: Self-pay

## 2019-12-30 ENCOUNTER — Encounter: Payer: Self-pay | Admitting: Hematology

## 2019-12-30 ENCOUNTER — Inpatient Hospital Stay: Payer: Medicare Other

## 2019-12-30 ENCOUNTER — Inpatient Hospital Stay (HOSPITAL_BASED_OUTPATIENT_CLINIC_OR_DEPARTMENT_OTHER): Payer: Medicare Other | Admitting: Hematology

## 2019-12-30 VITALS — BP 136/60 | HR 86 | Temp 98.6°F | Resp 18 | Ht 66.0 in | Wt 243.8 lb

## 2019-12-30 DIAGNOSIS — R918 Other nonspecific abnormal finding of lung field: Secondary | ICD-10-CM | POA: Diagnosis not present

## 2019-12-30 DIAGNOSIS — Z85828 Personal history of other malignant neoplasm of skin: Secondary | ICD-10-CM | POA: Diagnosis not present

## 2019-12-30 DIAGNOSIS — Z79811 Long term (current) use of aromatase inhibitors: Secondary | ICD-10-CM | POA: Diagnosis not present

## 2019-12-30 DIAGNOSIS — I700 Atherosclerosis of aorta: Secondary | ICD-10-CM | POA: Diagnosis not present

## 2019-12-30 DIAGNOSIS — D508 Other iron deficiency anemias: Secondary | ICD-10-CM

## 2019-12-30 DIAGNOSIS — C182 Malignant neoplasm of ascending colon: Secondary | ICD-10-CM

## 2019-12-30 DIAGNOSIS — E1140 Type 2 diabetes mellitus with diabetic neuropathy, unspecified: Secondary | ICD-10-CM | POA: Diagnosis not present

## 2019-12-30 DIAGNOSIS — Z95828 Presence of other vascular implants and grafts: Secondary | ICD-10-CM

## 2019-12-30 DIAGNOSIS — M1990 Unspecified osteoarthritis, unspecified site: Secondary | ICD-10-CM | POA: Diagnosis not present

## 2019-12-30 DIAGNOSIS — C50412 Malignant neoplasm of upper-outer quadrant of left female breast: Secondary | ICD-10-CM | POA: Diagnosis not present

## 2019-12-30 DIAGNOSIS — Z79899 Other long term (current) drug therapy: Secondary | ICD-10-CM | POA: Diagnosis not present

## 2019-12-30 DIAGNOSIS — Z7984 Long term (current) use of oral hypoglycemic drugs: Secondary | ICD-10-CM | POA: Diagnosis not present

## 2019-12-30 DIAGNOSIS — K219 Gastro-esophageal reflux disease without esophagitis: Secondary | ICD-10-CM | POA: Diagnosis not present

## 2019-12-30 DIAGNOSIS — Z5111 Encounter for antineoplastic chemotherapy: Secondary | ICD-10-CM | POA: Diagnosis not present

## 2019-12-30 DIAGNOSIS — Z170 Estrogen receptor positive status [ER+]: Secondary | ICD-10-CM | POA: Diagnosis not present

## 2019-12-30 DIAGNOSIS — D509 Iron deficiency anemia, unspecified: Secondary | ICD-10-CM | POA: Diagnosis not present

## 2019-12-30 DIAGNOSIS — I10 Essential (primary) hypertension: Secondary | ICD-10-CM | POA: Diagnosis not present

## 2019-12-30 DIAGNOSIS — Z923 Personal history of irradiation: Secondary | ICD-10-CM | POA: Diagnosis not present

## 2019-12-30 LAB — CBC WITH DIFFERENTIAL (CANCER CENTER ONLY)
Abs Immature Granulocytes: 0.02 10*3/uL (ref 0.00–0.07)
Basophils Absolute: 0 10*3/uL (ref 0.0–0.1)
Basophils Relative: 1 %
Eosinophils Absolute: 0.1 10*3/uL (ref 0.0–0.5)
Eosinophils Relative: 3 %
HCT: 34.2 % — ABNORMAL LOW (ref 36.0–46.0)
Hemoglobin: 11.2 g/dL — ABNORMAL LOW (ref 12.0–15.0)
Immature Granulocytes: 0 %
Lymphocytes Relative: 48 %
Lymphs Abs: 2.7 10*3/uL (ref 0.7–4.0)
MCH: 29.7 pg (ref 26.0–34.0)
MCHC: 32.7 g/dL (ref 30.0–36.0)
MCV: 90.7 fL (ref 80.0–100.0)
Monocytes Absolute: 0.5 10*3/uL (ref 0.1–1.0)
Monocytes Relative: 8 %
Neutro Abs: 2.2 10*3/uL (ref 1.7–7.7)
Neutrophils Relative %: 40 %
Platelet Count: 220 10*3/uL (ref 150–400)
RBC: 3.77 MIL/uL — ABNORMAL LOW (ref 3.87–5.11)
RDW: 17.4 % — ABNORMAL HIGH (ref 11.5–15.5)
WBC Count: 5.5 10*3/uL (ref 4.0–10.5)
nRBC: 0 % (ref 0.0–0.2)

## 2019-12-30 LAB — CMP (CANCER CENTER ONLY)
ALT: 41 U/L (ref 0–44)
AST: 44 U/L — ABNORMAL HIGH (ref 15–41)
Albumin: 3.5 g/dL (ref 3.5–5.0)
Alkaline Phosphatase: 125 U/L (ref 38–126)
Anion gap: 10 (ref 5–15)
BUN: 16 mg/dL (ref 8–23)
CO2: 22 mmol/L (ref 22–32)
Calcium: 9.7 mg/dL (ref 8.9–10.3)
Chloride: 109 mmol/L (ref 98–111)
Creatinine: 0.76 mg/dL (ref 0.44–1.00)
GFR, Est AFR Am: >60 mL/min (ref >60)
GFR, Estimated: >60 mL/min (ref >60)
Glucose, Bld: 137 mg/dL — ABNORMAL HIGH (ref 70–99)
Potassium: 3.7 mmol/L (ref 3.5–5.1)
Sodium: 141 mmol/L (ref 135–145)
Total Bilirubin: 0.8 mg/dL (ref 0.3–1.2)
Total Protein: 6.9 g/dL (ref 6.5–8.1)

## 2019-12-30 LAB — IRON AND TIBC
Iron: 48 ug/dL (ref 41–142)
Saturation Ratios: 12 % — ABNORMAL LOW (ref 21–57)
TIBC: 388 ug/dL (ref 236–444)
UIBC: 340 ug/dL (ref 120–384)

## 2019-12-30 LAB — FERRITIN: Ferritin: 79 ng/mL (ref 11–307)

## 2019-12-30 LAB — CEA (IN HOUSE-CHCC): CEA (CHCC-In House): 4.56 ng/mL (ref 0.00–5.00)

## 2019-12-30 MED ORDER — SODIUM CHLORIDE 0.9 % IV SOLN
2400.0000 mg/m2 | INTRAVENOUS | Status: DC
  Administered 2019-12-30: 5400 mg via INTRAVENOUS
  Filled 2019-12-30: qty 108

## 2019-12-30 MED ORDER — FLUOROURACIL CHEMO INJECTION 2.5 GM/50ML
400.0000 mg/m2 | Freq: Once | INTRAVENOUS | Status: AC
  Administered 2019-12-30: 900 mg via INTRAVENOUS
  Filled 2019-12-30: qty 18

## 2019-12-30 MED ORDER — PALONOSETRON HCL INJECTION 0.25 MG/5ML
0.2500 mg | Freq: Once | INTRAVENOUS | Status: AC
  Administered 2019-12-30: 0.25 mg via INTRAVENOUS

## 2019-12-30 MED ORDER — LEUCOVORIN CALCIUM INJECTION 350 MG
400.0000 mg/m2 | Freq: Once | INTRAVENOUS | Status: AC
  Administered 2019-12-30: 904 mg via INTRAVENOUS
  Filled 2019-12-30: qty 45.2

## 2019-12-30 MED ORDER — SODIUM CHLORIDE 0.9% FLUSH
10.0000 mL | INTRAVENOUS | Status: DC | PRN
  Administered 2019-12-30: 10 mL
  Filled 2019-12-30: qty 10

## 2019-12-30 MED ORDER — SODIUM CHLORIDE 0.9 % IV SOLN
10.0000 mg | Freq: Once | INTRAVENOUS | Status: AC
  Administered 2019-12-30: 10 mg via INTRAVENOUS
  Filled 2019-12-30: qty 10

## 2019-12-30 MED ORDER — DEXTROSE 5 % IV SOLN
Freq: Once | INTRAVENOUS | Status: AC
  Filled 2019-12-30: qty 250

## 2019-12-30 MED ORDER — OXALIPLATIN CHEMO INJECTION 100 MG/20ML
85.0000 mg/m2 | Freq: Once | INTRAVENOUS | Status: AC
  Administered 2019-12-30: 190 mg via INTRAVENOUS
  Filled 2019-12-30: qty 38

## 2019-12-30 MED ORDER — PALONOSETRON HCL INJECTION 0.25 MG/5ML
INTRAVENOUS | Status: AC
  Filled 2019-12-30: qty 5

## 2019-12-30 NOTE — Patient Instructions (Signed)
[facility] Discharge Instructions for Patients Receiving Chemotherapy  Today you received the following chemotherapy agents: Oxaliplatin, Leucovorin, and Fluorouracil  To help prevent nausea and vomiting after your treatment, we encourage you to take your nausea medication  as prescribed.    If you develop nausea and vomiting that is not controlled by your nausea medication, call the clinic.   BELOW ARE SYMPTOMS THAT SHOULD BE REPORTED IMMEDIATELY:  *FEVER GREATER THAN 100.5 F  *CHILLS WITH OR WITHOUT FEVER  NAUSEA AND VOMITING THAT IS NOT CONTROLLED WITH YOUR NAUSEA MEDICATION  *UNUSUAL SHORTNESS OF BREATH  *UNUSUAL BRUISING OR BLEEDING  TENDERNESS IN MOUTH AND THROAT WITH OR WITHOUT PRESENCE OF ULCERS  *URINARY PROBLEMS  *BOWEL PROBLEMS  UNUSUAL RASH Items with * indicate a potential emergency and should be followed up as soon as possible.  Feel free to call the clinic should you have any questions or concerns. The clinic phone number is [phone].  Please show the CHEMO ALERT CARD at check-in to the Emergency Department and triage nurse.   

## 2019-12-31 ENCOUNTER — Telehealth: Payer: Self-pay | Admitting: Hematology

## 2019-12-31 NOTE — Telephone Encounter (Signed)
Scheduled per 8/30 los. Pt is aware of appt time and date.

## 2020-01-01 ENCOUNTER — Inpatient Hospital Stay: Payer: Medicare Other | Attending: Hematology

## 2020-01-01 ENCOUNTER — Other Ambulatory Visit: Payer: Self-pay

## 2020-01-01 VITALS — BP 125/58 | HR 65 | Resp 18

## 2020-01-01 DIAGNOSIS — Z5189 Encounter for other specified aftercare: Secondary | ICD-10-CM | POA: Diagnosis not present

## 2020-01-01 DIAGNOSIS — Z7982 Long term (current) use of aspirin: Secondary | ICD-10-CM | POA: Diagnosis not present

## 2020-01-01 DIAGNOSIS — D701 Agranulocytosis secondary to cancer chemotherapy: Secondary | ICD-10-CM | POA: Insufficient documentation

## 2020-01-01 DIAGNOSIS — C50412 Malignant neoplasm of upper-outer quadrant of left female breast: Secondary | ICD-10-CM | POA: Insufficient documentation

## 2020-01-01 DIAGNOSIS — Z923 Personal history of irradiation: Secondary | ICD-10-CM | POA: Insufficient documentation

## 2020-01-01 DIAGNOSIS — Z170 Estrogen receptor positive status [ER+]: Secondary | ICD-10-CM | POA: Insufficient documentation

## 2020-01-01 DIAGNOSIS — D509 Iron deficiency anemia, unspecified: Secondary | ICD-10-CM | POA: Diagnosis not present

## 2020-01-01 DIAGNOSIS — E1140 Type 2 diabetes mellitus with diabetic neuropathy, unspecified: Secondary | ICD-10-CM | POA: Insufficient documentation

## 2020-01-01 DIAGNOSIS — T451X5A Adverse effect of antineoplastic and immunosuppressive drugs, initial encounter: Secondary | ICD-10-CM | POA: Diagnosis not present

## 2020-01-01 DIAGNOSIS — Z23 Encounter for immunization: Secondary | ICD-10-CM | POA: Diagnosis not present

## 2020-01-01 DIAGNOSIS — K219 Gastro-esophageal reflux disease without esophagitis: Secondary | ICD-10-CM | POA: Diagnosis not present

## 2020-01-01 DIAGNOSIS — R7401 Elevation of levels of liver transaminase levels: Secondary | ICD-10-CM | POA: Insufficient documentation

## 2020-01-01 DIAGNOSIS — I10 Essential (primary) hypertension: Secondary | ICD-10-CM | POA: Insufficient documentation

## 2020-01-01 DIAGNOSIS — Z9221 Personal history of antineoplastic chemotherapy: Secondary | ICD-10-CM | POA: Insufficient documentation

## 2020-01-01 DIAGNOSIS — M1990 Unspecified osteoarthritis, unspecified site: Secondary | ICD-10-CM | POA: Insufficient documentation

## 2020-01-01 DIAGNOSIS — R600 Localized edema: Secondary | ICD-10-CM | POA: Diagnosis not present

## 2020-01-01 DIAGNOSIS — Z79811 Long term (current) use of aromatase inhibitors: Secondary | ICD-10-CM | POA: Insufficient documentation

## 2020-01-01 DIAGNOSIS — Z7984 Long term (current) use of oral hypoglycemic drugs: Secondary | ICD-10-CM | POA: Diagnosis not present

## 2020-01-01 DIAGNOSIS — Z79899 Other long term (current) drug therapy: Secondary | ICD-10-CM | POA: Diagnosis not present

## 2020-01-01 DIAGNOSIS — Z5111 Encounter for antineoplastic chemotherapy: Secondary | ICD-10-CM | POA: Insufficient documentation

## 2020-01-01 DIAGNOSIS — Z803 Family history of malignant neoplasm of breast: Secondary | ICD-10-CM | POA: Insufficient documentation

## 2020-01-01 DIAGNOSIS — E1136 Type 2 diabetes mellitus with diabetic cataract: Secondary | ICD-10-CM | POA: Insufficient documentation

## 2020-01-01 DIAGNOSIS — I700 Atherosclerosis of aorta: Secondary | ICD-10-CM | POA: Insufficient documentation

## 2020-01-01 DIAGNOSIS — C182 Malignant neoplasm of ascending colon: Secondary | ICD-10-CM | POA: Diagnosis present

## 2020-01-01 DIAGNOSIS — Z85828 Personal history of other malignant neoplasm of skin: Secondary | ICD-10-CM | POA: Diagnosis not present

## 2020-01-01 MED ORDER — HEPARIN SOD (PORK) LOCK FLUSH 100 UNIT/ML IV SOLN
500.0000 [IU] | Freq: Once | INTRAVENOUS | Status: AC | PRN
  Administered 2020-01-01: 500 [IU]
  Filled 2020-01-01: qty 5

## 2020-01-01 MED ORDER — SODIUM CHLORIDE 0.9% FLUSH
10.0000 mL | INTRAVENOUS | Status: DC | PRN
  Administered 2020-01-01: 10 mL
  Filled 2020-01-01: qty 10

## 2020-01-08 ENCOUNTER — Inpatient Hospital Stay: Payer: Medicare Other

## 2020-01-08 ENCOUNTER — Other Ambulatory Visit: Payer: Self-pay

## 2020-01-08 DIAGNOSIS — Z23 Encounter for immunization: Secondary | ICD-10-CM

## 2020-01-08 NOTE — Progress Notes (Signed)
   Covid-19 Vaccination Clinic  Name:  Kim Castro    MRN: [id number] DOB: [birth year]  01/08/2020  [name] was observed post Covid-19 immunization for 15 minutes without incident. She was provided with Vaccine Information Sheet and instruction to access the V-Safe system.   [name] was instructed to call 911 with any severe reactions post vaccine: Marland Kitchen Difficulty breathing  . Swelling of face and throat  . A fast heartbeat  . A bad rash all over body  . Dizziness and weakness

## 2020-01-10 ENCOUNTER — Telehealth: Payer: Self-pay | Admitting: Hematology and Oncology

## 2020-01-10 NOTE — Telephone Encounter (Signed)
Scheduled per provider request. Pt will receive an updated appt calendar, per appt note

## 2020-01-10 NOTE — Progress Notes (Signed)
[place]   Telephone:([phone] [phone]   Clinic Follow up Note   Patient Care Team: Hulan Fess, MD as PCP - General (Family Medicine) Excell Seltzer, MD (Inactive) as Consulting Physician (General Surgery) Rolm Bookbinder, MD as Consulting Physician (General Surgery) Thea Silversmith, MD as Consulting Physician (Radiation Oncology) Holley Bouche, NP (Inactive) as Nurse Practitioner (Nurse Practitioner) Truitt Merle, MD as Consulting Physician (Hematology) Jonnie Finner, RN as Oncology Nurse Navigator Leighton Ruff, MD as Consulting Physician (General Surgery) Nicholas Lose, MD as Consulting Physician (Hematology and Oncology) Leighton Ruff, MD as Consulting Physician (General Surgery)  Date of Service:  01/13/2020  CHIEF COMPLAINT: F/u of colon cancer  SUMMARY OF ONCOLOGIC HISTORY: Oncology History Overview Note  Cancer Staging Breast cancer of upper-outer quadrant of left female breast [facility]) Staging form: Breast, AJCC 7th Edition - Clinical stage from 06/11/2014: Stage 0 (Tis (DCIS), N0, M0) - Unsigned - Pathologic stage from 07/03/2014: Stage Unknown (Tis (DCIS), NX, cM0) - Signed by Enid Cutter, MD on 07/10/2014 Staging comments: Staged on final lumpectomy specimen by Dr. Donato Heinz.  right colon cancer Staging form: Colon and Rectum, AJCC 8th Edition - Pathologic stage from 11/01/2019: Stage IIIB (pT3, pN2a, cM0) - Signed by Truitt Merle, MD on 11/29/2019    Breast cancer of upper-outer quadrant of left female breast ([place])  05/29/2014 Initial Biopsy   Left breast needle core biopsy: Grade 2, DCIS with calcs. ER+ (100%), PR+ (96%).    06/04/2014 Initial Diagnosis   Left breast DCIS with calcifications, ER 100%, PR 96%   06/10/2014 Breast MRI   Left breast: 2.4 x 1.3 x 1.1 cm area of patchy non-mass enhancement upper outer quadrant includes postbiopsy seroma; Right breast: 1.2 cm previously biopsied stable benign fibroadenoma   06/12/2014  Procedure   Genetic counseling/testing: Identified 1 VUS on CHEK2 gene. Remainder of 17 gene panel tested negative and included: ATM, BARD1, BRCA 1/2, BRIP1, CDH1, CHEK2, EPCAM, MLH1, MSH2, MSH6, NBN, NF1, PALB2, PTEN, RAD50, RAD51C, RAD51D, STK11, and TP53.    07/01/2014 Surgery   Left breast lumpectomy ([place]): Grade 1, DCIS, spanning 2.3 cm, 1 mm margin, ER 100%, PR 96%   07/31/2014 - 08/28/2014 Radiation Therapy   Adjuvant RT completed Pablo Ledger). Left breast: Total dose 42.5 Gy over 17 fractions. Left breast boost: Total dose 7.5 Gy over 3 fractions.    09/14/2014 - 01/13/2020 Anti-estrogen oral therapy   Anastrazole $RemoveBefo'1mg'jfAEzJPUcvd$  daily. Planned duration of treatment: 5 years [place]). Completed in 01/2020.    09/25/2014 Survivorship   Survivorship Care Plan given to patient and reviewed with her in person.    right colon cancer  09/19/2019 Imaging   CT AP W contrast 09/19/19  IMPRESSION Fullness in the cecum, cannot exclude a mass. No evidence for metastatic disease is identified.    09/24/2019 Procedure   Colonoscopy by Dr Eber Jones 09/24/19 IMPRESSION 1. The colon was redundant  2. Mild diverticulosis was noted through the entire examined colon 3. Single 55mm polyp was found in the ascending colon; polypectomy was performed using snare cautery and biopsy forceps 4. Mild diverticulosis was notes in the descending colon and sigmoid colon.  5. Single polyp was found in the sigmoid colon, polypectomy was performed with cold forceps.  6. Single polyp was found in the rectosigmoid colon; polypectomy was performed with cold snare  7. Small internal hemorrhoids  8. Large mass was found at the cecum; multiple biopsies of the area were performed using cold forceps; injection (tattooing) was performed  distal to the mass.    09/24/2019 Initial Biopsy   INTERPRETATION AND DIAGNOSIS:  A. Cecum, biopsy:  Invasive moderately differentiated adenocarcinoma.  see comment  B. Polyp @ ascending colon, polypectomy:   Tubular Adenoma  C. Polyp @ sigmoid colon Polypectomy:  hyperplastic polyp.  D. Polyp @ rectosigmoid colon, Polypectomy:  Hyperplastic Polyp      10/16/2019 Imaging   CT Chest IMPRESSION: 1. Multiple small pulmonary nodules measuring 5 mm or less in size in the lungs. These are nonspecific and are typically considered statistically likely benign. However, given the patient's history of primary malignancy, close attention on follow-up studies is recommended to ensure stability. 2. Aortic atherosclerosis, in addition to right coronary artery disease. Assessment for potential risk factor modification, dietary therapy or pharmacologic therapy may be warranted, if clinically indicated. 3. There are calcifications of the aortic valve and mitral annulus. Echocardiographic correlation for evaluation of potential valvular dysfunction may be warranted if clinically indicated. 4. Small hiatal hernia.   Aortic Atherosclerosis (ICD10-I70.0).   11/01/2019 Initial Diagnosis   Colon cancer ([place])   11/01/2019 Surgery   LAPAROSCOPIC PARTIAL COLECTOMY by Dr Marcello Moores and Dr Johney Maine   11/01/2019 Pathology Results   FINAL MICROSCOPIC DIAGNOSIS:   A. COLON, PROXIMAL RIGHT, COLECTOMY:  - Invasive colonic adenocarcinoma, 5 cm.  - Tumor invades through the muscularis propria into pericolonic tissues.   - Margins of resection are not involved.  - Metastatic carcinoma in (5) of (13) lymph nodes.  - See oncology table.    MSI Stable  Mismatch repair normal  MLH1 - Preserved nuclear expression (greater 50% tumor expression) MSH2 - Preserved nuclear expression (greater 50% tumor expression) MSH6 - Preserved nuclear expression (greater 50% tumor expression) PMS2 - Preserved nuclear expression (greater 50% tumor expression)   11/01/2019 Cancer Staging   Staging form: Colon and Rectum, AJCC 8th Edition - Pathologic stage from 11/01/2019: Stage IIIB (pT3, pN2a, cM0) - Signed by Truitt Merle, MD on 11/29/2019    12/10/2019 Procedure   PAC placed 12/10/19   12/17/2019 -  Chemotherapy   FOLFOX q2weeks starting in 2 weeks starting 12/17/19      CURRENT THERAPY:  FOLFOX q2weeks starting in 2 weeksstarting 12/17/19  INTERVAL HISTORY:  Kim Castro is here for a follow up and treatment. She presents to the clinic alone. She notes 1 week after C2 infusion she started experiencing SOB upon mild exertion such as walking to her mailbox. She can breathe fine in her house. She notes hard knot of medial right leg. She also notes she has LE edema. She was previously on HCTZ 2 years ago.    REVIEW OF SYSTEMS:   Constitutional: Denies fevers, chills or abnormal weight loss Eyes: Denies blurriness of vision Ears, nose, mouth, throat, and face: Denies mucositis or sore throat Respiratory: Denies cough or wheezes (+) SOB upon mild exertion Cardiovascular: Denies palpitation, chest discomfort (+) lower extremity swelling Gastrointestinal:  Denies nausea, heartburn or change in bowel habits Skin: Denies abnormal skin rashes Lymphatics: Denies new lymphadenopathy or easy bruising Neurological:Denies numbness, tingling or new weaknesses Behavioral/Psych: Mood is stable, no new changes  All other systems were reviewed with the patient and are negative.  MEDICAL HISTORY:  Past Medical History:  Diagnosis Date   Aortic atherosclerosis (HCC)    Arthritis    feet, lower back   Basal cell carcinoma    arm   Breast cancer of upper-outer quadrant of left female breast ([place]) 06/04/2014   Cataract  immature on the left   Colon cancer ([place]) 08/2019   Diabetes mellitus without complication ([place])    Diverticulosis    Dizziness    > 33yrs ago;took Antivert    Family history of anesthesia complication    sister slow to wake up with anesthesia   Family history of breast cancer    Family history of colon cancer    Family history of uterine cancer    GERD (gastroesophageal reflux disease)    takes  occasional TUMs   History of bronchitis    > 33yrs ago   History of colon polyps    History of hiatal hernia    Small noted on CT   Hypertension    takes Losartan daily and HCTZ   Iron deficiency anemia    Joint pain    Numbness    to toes on each foot   Peripheral neuropathy    feet and toes   Personal history of radiation therapy    Pulmonary nodules    Noted on CT   Radiation 07/31/14-08/28/14   Left Breast 20 fxs   Seasonal allergies    takes Claritin prn   Urinary frequency    Vitamin D deficiency    takes VIt D daily    SURGICAL HISTORY: Past Surgical History:  Procedure Laterality Date   BREAST BIOPSY Bilateral    BREAST LUMPECTOMY Left    BREAST LUMPECTOMY WITH RADIOACTIVE SEED LOCALIZATION Left 07/01/2014   Procedure: LEFT BREAST LUMPECTOMY WITH RADIOACTIVE SEED LOCALIZATION;  Surgeon: Excell Seltzer, MD;  Location: [facility];  Service: General;  Laterality: Left;   CATARACT EXTRACTION Right    COLONOSCOPY     LAPAROSCOPIC PARTIAL COLECTOMY N/A 11/01/2019   Procedure: LAPAROSCOPIC PARTIAL COLECTOMY;  Surgeon: Leighton Ruff, MD;  Location: [facility];  Service: General;  Laterality: N/A;   PORTACATH PLACEMENT N/A 12/10/2019   Procedure: INSERTION PORT-A-CATH ULTRASOUND GUIDED IN RIGHT IJ;  Surgeon: Leighton Ruff, MD;  Location: [facility];  Service: General;  Laterality: N/A;   TOTAL KNEE ARTHROPLASTY Left 10/24/2012   Procedure: TOTAL KNEE ARTHROPLASTY;  Surgeon: Kerin Salen, MD;  Location: [place] [facility];  Service: Orthopedics;  Laterality: Left;   TOTAL KNEE ARTHROPLASTY Right 01/09/2013   Procedure: TOTAL KNEE ARTHROPLASTY;  Surgeon: Kerin Salen, MD;  Location: [place] [facility];  Service: Orthopedics;  Laterality: Right;   TUBAL LIGATION      I have reviewed the social history and family history with the patient and they are unchanged from previous note.  ALLERGIES:  is allergic to oxycodone.  MEDICATIONS:  Current Outpatient Medications   Medication Sig Dispense Refill   acetaminophen (TYLENOL) 500 MG tablet Take 2 tablets (1,000 mg total) by mouth every 6 (six) hours as needed. (Patient not taking: Reported on 12/03/2019) 30 tablet 0   anastrozole (ARIMIDEX) 1 MG tablet Take 1 tablet by mouth once daily 30 tablet 0   aspirin EC 81 MG tablet Take 81 mg by mouth daily. Swallow whole.     Cholecalciferol (VITAMIN D) 2000 UNITS CAPS Take 2,000 Units by mouth daily.      Ferrous Sulfate (SLOW FE PO) Take 1 tablet by mouth in the morning and at bedtime.      furosemide (LASIX) 20 MG tablet Take 1 tablet (20 mg total) by mouth daily. 30 tablet 0   ibuprofen (ADVIL) 200 MG tablet Take 400 mg by mouth every 6 (six) hours as needed for moderate pain.      lidocaine-prilocaine (  EMLA) cream Apply to affected area once (Patient taking differently: Apply 1 application topically daily as needed (port access). ) 30 g 3   loratadine (CLARITIN) 10 MG tablet Take 10 mg by mouth daily as needed for allergies.     losartan (COZAAR) 25 MG tablet Take 25 mg by mouth daily.     metFORMIN (GLUCOPHAGE) 500 MG tablet Take 1 tablet (500 mg total) by mouth daily with breakfast. (Patient taking differently: Take 500 mg by mouth in the morning and at bedtime. )     Multiple Vitamins-Minerals (MULTIVITAMIN WITH MINERALS) tablet Take 1 tablet by mouth daily.     omeprazole (PRILOSEC) 20 MG capsule Take 20 mg by mouth daily before breakfast.      ondansetron (ZOFRAN) 8 MG tablet Take 1 tablet (8 mg total) by mouth 2 (two) times daily as needed for refractory nausea / vomiting. Start on day 3 after chemotherapy. 30 tablet 1   potassium chloride (KLOR-CON) 10 MEQ tablet Take 1 tablet (10 mEq total) by mouth daily. 30 tablet 0   pravastatin (PRAVACHOL) 10 MG tablet Take 10 mg by mouth daily.     prochlorperazine (COMPAZINE) 10 MG tablet Take 1 tablet (10 mg total) by mouth every 6 (six) hours as needed (Nausea or vomiting). 30 tablet 1   No current  facility-administered medications for this visit.   Facility-Administered Medications Ordered in Other Visits  Medication Dose Route Frequency Provider Last Rate Last Admin   dexamethasone (DECADRON) 10 mg in sodium chloride 0.9 % 50 mL IVPB  10 mg Intravenous Once Truitt Merle, MD       fluorouracil (ADRUCIL) 5,400 mg in sodium chloride 0.9 % 142 mL chemo infusion  2,400 mg/m2 (Treatment Plan Recorded) Intravenous 1 day or 1 dose Truitt Merle, MD       fluorouracil (ADRUCIL) chemo injection 900 mg  400 mg/m2 (Treatment Plan Recorded) Intravenous Once Truitt Merle, MD       heparin lock flush 100 unit/mL  500 Units Intracatheter Once PRN Truitt Merle, MD       leucovorin 904 mg in dextrose 5 % 250 mL infusion  400 mg/m2 (Treatment Plan Recorded) Intravenous Once Truitt Merle, MD       oxaliplatin (ELOXATIN) 160 mg in dextrose 5 % 500 mL chemo infusion  70 mg/m2 (Treatment Plan Recorded) Intravenous Once Truitt Merle, MD       sodium chloride flush (NS) 0.9 % injection 10 mL  10 mL Intracatheter PRN Truitt Merle, MD        PHYSICAL EXAMINATION: ECOG PERFORMANCE STATUS: 1 - Symptomatic but completely ambulatory  Vitals:   01/13/20 1009 01/13/20 1014  BP: (!) 174/66 (!) 157/91  Pulse: 81   Resp: 18   Temp: 99 F (37.2 C)   SpO2: 98%    Filed Weights   01/13/20 1009  Weight: 249 lb 11.2 oz (113.3 kg)    Due to COVID19 we will limit examination to appearance. Patient had no complaints.  GENERAL:alert, no distress and comfortable SKIN: skin color normal, no rashes or significant lesions EYES: normal, Conjunctiva are pink and non-injected, sclera clear  NEURO: alert & oriented x 3 with fluent speech   LABORATORY DATA:  I have reviewed the data as listed CBC Latest Ref Rng & Units 01/13/2020 12/30/2019 12/17/2019  WBC 4.0 - 10.5 K/uL 3.7(L) 5.5 9.7  Hemoglobin 12.0 - 15.0 g/dL 10.9(L) 11.2(L) 12.2  Hematocrit 36 - 46 % 33.5(L) 34.2(L) 38.1  Platelets 150 - 400 K/uL  153 220 341     CMP Latest Ref  Rng & Units 01/13/2020 12/30/2019 12/17/2019  Glucose 70 - 99 mg/dL 161(H) 137(H) 193(H)  BUN 8 - 23 mg/dL 14 16 31(H)  Creatinine 0.44 - 1.00 mg/dL 0.79 0.76 0.79  Sodium 135 - 145 mmol/L 142 141 141  Potassium 3.5 - 5.1 mmol/L 3.1(L) 3.7 4.3  Chloride 98 - 111 mmol/L 110 109 108  CO2 22 - 32 mmol/L $RemoveB'24 22 23  'hhZzYBiq$ Calcium 8.9 - 10.3 mg/dL 9.0 9.7 10.0  Total Protein 6.5 - 8.1 g/dL 6.7 6.9 7.3  Total Bilirubin 0.3 - 1.2 mg/dL 1.3(H) 0.8 0.7  Alkaline Phos 38 - 126 U/L 151(H) 125 96  AST 15 - 41 U/L 105(H) 44(H) 22  ALT 0 - 44 U/L 90(H) 41 22      RADIOGRAPHIC STUDIES: I have personally reviewed the radiological images as listed and agreed with the findings in the report. No results found.   ASSESSMENT & PLAN:  Charman Blasco is a 74 y.o. female with    1.Right colon cancer, pT3N2aM0 stage IIB, MSS -She was diagnosed in 08/2019 withcecal mass biopsy showed moderately differentiated adenocarcinoma. CT chest from 10/16/19 did shows multiple small lung nodules that were nonspecific but likely benign. Will monitor.  -She underwent colon surgery with Dr Marcello Moores on 11/01/19. Path showed overall Stage IIIB cancer.  -I started her on adjuvant chemotherapy for 6 months to reduce her risk of recurrence with FOLFOX every 2 weeks beginning 12/17/19. Based on neuropathy, Icanswitch her 5FU to Oral Xeloda single agent. -S/p C2 she experienced SOB upon mild exertion on her week off. This is likely related to her low blood counts. Her baseline mild neuropathy is stable. She also has increased fluids retention with weight gain.  -Labs reviewed, WBC 3.7, Hg 10.9, ANC 1.0, AST 105, ALT 90, total bilirubin 1.3. will proceed with C3 FOLFOX today with dose reduction due to mild transaminitis.  -Due to her neutropenia, I will request Udenyca on day 3 -f/u in 2 weeks   2.Iron deficient anemia -Her 10/03/19 Iron panel showed, Ferritin 13, Iron 34, sat ratios 8. Overall consistent with iron deficiency. -She did  require blood transfusion on 11/06/19 after colon surgery. -Currently on 2 tabs Slow Fe iron daily -Hg at 10.9 today (01/13/20)  3.H/o left breast DCIS, G2, ER/PR+ -Dx in 05/2014. Treated with left lumpectomy with Dr Excell Seltzer, adjuvant RT with Dr Pablo Ledger. She has been on Anastrozole since 08/2014 for 5 year therapy. He has been followed by Dr Lindi Adie.  -Bone density 06/09/2015 T score +1.1 normal. Plan to repeat DEXA in 2021. -I will take over her breast cancer care given she is on long term surveillance. She is fine to stop Anastrozole now (01/13/20) since she has completed 5 years treatment.  4. Comorbidities: Arthritis, DM, HTN, GERD -f/u with PCP Dr. Rex Kras  5. LE Edema -She has been getting IV Fluids with Chemo. S/p C2 she has been retaining fluids along with weight gain.  -I will call in Lasix $Remove'20mg'tPkvjZg$  along with oral potassium.  -I recommend she weight herself daily and if her LE improves or resolves she can reduce or stop lasix and potassium, then use as needed.   6.  Transaminitis -New today, likely secondary to chemotherapy -We will reduce her chemo dose, and follow-up closely next week.  PLAN: -Stop Anastrozole  -I called in Lasix and oral potassium today  -Labs reviewed and adequate to proceed with C3 FOLFOX today with oxaliplatin  dose reduction due to transaminitis. -Udenyca on day 3 due to her neutropenia -f/u lab next week  -Lab, flush, F/u and FOLFOX in 2, 4, 6 weeks    No problem-specific Assessment & Plan notes found for this encounter.   No orders of the defined types were placed in this encounter.  All questions were answered. The patient knows to call the clinic with any problems, questions or concerns. No barriers to learning was detected. The total time spent in the appointment was 30 minutes.     Truitt Merle, MD 01/13/2020   I, Joslyn Devon, am acting as scribe for Truitt Merle, MD.   I have reviewed the above documentation for accuracy and completeness,  and I agree with the above.

## 2020-01-13 ENCOUNTER — Other Ambulatory Visit: Payer: Self-pay

## 2020-01-13 ENCOUNTER — Encounter: Payer: Self-pay | Admitting: Hematology

## 2020-01-13 ENCOUNTER — Inpatient Hospital Stay: Payer: Medicare Other

## 2020-01-13 ENCOUNTER — Inpatient Hospital Stay: Payer: Medicare Other | Admitting: Hematology

## 2020-01-13 ENCOUNTER — Telehealth: Payer: Self-pay | Admitting: Hematology

## 2020-01-13 VITALS — BP 157/91 | HR 81 | Temp 99.0°F | Resp 18 | Ht 66.0 in | Wt 249.7 lb

## 2020-01-13 VITALS — BP 146/66

## 2020-01-13 DIAGNOSIS — Z95828 Presence of other vascular implants and grafts: Secondary | ICD-10-CM

## 2020-01-13 DIAGNOSIS — Z5189 Encounter for other specified aftercare: Secondary | ICD-10-CM | POA: Diagnosis not present

## 2020-01-13 DIAGNOSIS — C182 Malignant neoplasm of ascending colon: Secondary | ICD-10-CM

## 2020-01-13 DIAGNOSIS — C50412 Malignant neoplasm of upper-outer quadrant of left female breast: Secondary | ICD-10-CM | POA: Diagnosis not present

## 2020-01-13 DIAGNOSIS — R600 Localized edema: Secondary | ICD-10-CM | POA: Diagnosis not present

## 2020-01-13 DIAGNOSIS — D701 Agranulocytosis secondary to cancer chemotherapy: Secondary | ICD-10-CM | POA: Diagnosis not present

## 2020-01-13 DIAGNOSIS — Z79811 Long term (current) use of aromatase inhibitors: Secondary | ICD-10-CM | POA: Diagnosis not present

## 2020-01-13 DIAGNOSIS — Z170 Estrogen receptor positive status [ER+]: Secondary | ICD-10-CM

## 2020-01-13 DIAGNOSIS — I10 Essential (primary) hypertension: Secondary | ICD-10-CM | POA: Diagnosis not present

## 2020-01-13 DIAGNOSIS — R7401 Elevation of levels of liver transaminase levels: Secondary | ICD-10-CM | POA: Diagnosis not present

## 2020-01-13 DIAGNOSIS — Z9221 Personal history of antineoplastic chemotherapy: Secondary | ICD-10-CM | POA: Diagnosis not present

## 2020-01-13 DIAGNOSIS — Z23 Encounter for immunization: Secondary | ICD-10-CM | POA: Diagnosis not present

## 2020-01-13 DIAGNOSIS — D509 Iron deficiency anemia, unspecified: Secondary | ICD-10-CM | POA: Diagnosis not present

## 2020-01-13 DIAGNOSIS — T451X5A Adverse effect of antineoplastic and immunosuppressive drugs, initial encounter: Secondary | ICD-10-CM | POA: Diagnosis not present

## 2020-01-13 DIAGNOSIS — Z923 Personal history of irradiation: Secondary | ICD-10-CM | POA: Diagnosis not present

## 2020-01-13 DIAGNOSIS — I700 Atherosclerosis of aorta: Secondary | ICD-10-CM | POA: Diagnosis not present

## 2020-01-13 DIAGNOSIS — Z5111 Encounter for antineoplastic chemotherapy: Secondary | ICD-10-CM | POA: Diagnosis not present

## 2020-01-13 LAB — CMP (CANCER CENTER ONLY)
ALT: 90 U/L — ABNORMAL HIGH (ref 0–44)
AST: 105 U/L — ABNORMAL HIGH (ref 15–41)
Albumin: 3.3 g/dL — ABNORMAL LOW (ref 3.5–5.0)
Alkaline Phosphatase: 151 U/L — ABNORMAL HIGH (ref 38–126)
Anion gap: 8 (ref 5–15)
BUN: 14 mg/dL (ref 8–23)
CO2: 24 mmol/L (ref 22–32)
Calcium: 9 mg/dL (ref 8.9–10.3)
Chloride: 110 mmol/L (ref 98–111)
Creatinine: 0.79 mg/dL (ref 0.44–1.00)
GFR, Est AFR Am: >60 mL/min (ref >60)
GFR, Estimated: >60 mL/min (ref >60)
Glucose, Bld: 161 mg/dL — ABNORMAL HIGH (ref 70–99)
Potassium: 3.1 mmol/L — ABNORMAL LOW (ref 3.5–5.1)
Sodium: 142 mmol/L (ref 135–145)
Total Bilirubin: 1.3 mg/dL — ABNORMAL HIGH (ref 0.3–1.2)
Total Protein: 6.7 g/dL (ref 6.5–8.1)

## 2020-01-13 LAB — CBC WITH DIFFERENTIAL (CANCER CENTER ONLY)
Abs Immature Granulocytes: 0.02 10*3/uL (ref 0.00–0.07)
Basophils Absolute: 0.1 10*3/uL (ref 0.0–0.1)
Basophils Relative: 1 %
Eosinophils Absolute: 0.1 10*3/uL (ref 0.0–0.5)
Eosinophils Relative: 4 %
HCT: 33.5 % — ABNORMAL LOW (ref 36.0–46.0)
Hemoglobin: 10.9 g/dL — ABNORMAL LOW (ref 12.0–15.0)
Immature Granulocytes: 1 %
Lymphocytes Relative: 57 %
Lymphs Abs: 2.1 10*3/uL (ref 0.7–4.0)
MCH: 29.5 pg (ref 26.0–34.0)
MCHC: 32.5 g/dL (ref 30.0–36.0)
MCV: 90.5 fL (ref 80.0–100.0)
Monocytes Absolute: 0.4 10*3/uL (ref 0.1–1.0)
Monocytes Relative: 11 %
Neutro Abs: 1 10*3/uL — ABNORMAL LOW (ref 1.7–7.7)
Neutrophils Relative %: 26 %
Platelet Count: 153 10*3/uL (ref 150–400)
RBC: 3.7 MIL/uL — ABNORMAL LOW (ref 3.87–5.11)
RDW: 17.7 % — ABNORMAL HIGH (ref 11.5–15.5)
WBC Count: 3.7 10*3/uL — ABNORMAL LOW (ref 4.0–10.5)
nRBC: 0 % (ref 0.0–0.2)

## 2020-01-13 LAB — IRON AND TIBC
Iron: 83 ug/dL (ref 41–142)
Saturation Ratios: 21 % (ref 21–57)
TIBC: 388 ug/dL (ref 236–444)
UIBC: 305 ug/dL (ref 120–384)

## 2020-01-13 LAB — FERRITIN: Ferritin: 97 ng/mL (ref 11–307)

## 2020-01-13 MED ORDER — LEUCOVORIN CALCIUM INJECTION 350 MG
400.0000 mg/m2 | Freq: Once | INTRAVENOUS | Status: AC
  Administered 2020-01-13: 904 mg via INTRAVENOUS
  Filled 2020-01-13: qty 45.2

## 2020-01-13 MED ORDER — PALONOSETRON HCL INJECTION 0.25 MG/5ML
0.2500 mg | Freq: Once | INTRAVENOUS | Status: AC
  Administered 2020-01-13: 0.25 mg via INTRAVENOUS

## 2020-01-13 MED ORDER — SODIUM CHLORIDE 0.9% FLUSH
10.0000 mL | INTRAVENOUS | Status: DC | PRN
  Administered 2020-01-13: 10 mL
  Filled 2020-01-13: qty 10

## 2020-01-13 MED ORDER — SODIUM CHLORIDE 0.9 % IV SOLN
2400.0000 mg/m2 | INTRAVENOUS | Status: DC
  Administered 2020-01-13: 5400 mg via INTRAVENOUS
  Filled 2020-01-13: qty 108

## 2020-01-13 MED ORDER — POTASSIUM CHLORIDE ER 10 MEQ PO TBCR: 10.0000 meq | EXTENDED_RELEASE_TABLET | Freq: Every day | ORAL | 0 refills | Status: DC

## 2020-01-13 MED ORDER — FUROSEMIDE 20 MG PO TABS: 20.0000 mg | ORAL_TABLET | Freq: Every day | ORAL | 0 refills | Status: DC

## 2020-01-13 MED ORDER — SODIUM CHLORIDE 0.9% FLUSH
10.0000 mL | INTRAVENOUS | Status: DC | PRN
  Filled 2020-01-13: qty 10

## 2020-01-13 MED ORDER — DEXTROSE 5 % IV SOLN
Freq: Once | INTRAVENOUS | Status: AC
  Filled 2020-01-13: qty 250

## 2020-01-13 MED ORDER — HEPARIN SOD (PORK) LOCK FLUSH 100 UNIT/ML IV SOLN
500.0000 [IU] | Freq: Once | INTRAVENOUS | Status: DC | PRN
  Filled 2020-01-13: qty 5

## 2020-01-13 MED ORDER — PALONOSETRON HCL INJECTION 0.25 MG/5ML
INTRAVENOUS | Status: AC
  Filled 2020-01-13: qty 5

## 2020-01-13 MED ORDER — FLUOROURACIL CHEMO INJECTION 2.5 GM/50ML
400.0000 mg/m2 | Freq: Once | INTRAVENOUS | Status: AC
  Administered 2020-01-13: 900 mg via INTRAVENOUS
  Filled 2020-01-13: qty 18

## 2020-01-13 MED ORDER — OXALIPLATIN CHEMO INJECTION 100 MG/20ML
70.0000 mg/m2 | Freq: Once | INTRAVENOUS | Status: AC
  Administered 2020-01-13: 160 mg via INTRAVENOUS
  Filled 2020-01-13: qty 32

## 2020-01-13 MED ORDER — SODIUM CHLORIDE 0.9 % IV SOLN
10.0000 mg | Freq: Once | INTRAVENOUS | Status: AC
  Administered 2020-01-13: 10 mg via INTRAVENOUS
  Filled 2020-01-13: qty 10

## 2020-01-13 NOTE — Telephone Encounter (Signed)
Scheduled appointments per 9/13 los. Patient is aware of upcoming appointments. I gave patient calendar print out.  

## 2020-01-13 NOTE — Patient Instructions (Signed)
[facility] Discharge Instructions for Patients Receiving Chemotherapy  Today you received the following chemotherapy agents: Oxaliplatin, Leucovorin, Fluorouracil (5 FU)  To help prevent nausea and vomiting after your treatment, we encourage you to take your nausea medication as directed by your MD.   If you develop nausea and vomiting that is not controlled by your nausea medication, call the clinic.   BELOW ARE SYMPTOMS THAT SHOULD BE REPORTED IMMEDIATELY:  *FEVER GREATER THAN 100.5 F  *CHILLS WITH OR WITHOUT FEVER  NAUSEA AND VOMITING THAT IS NOT CONTROLLED WITH YOUR NAUSEA MEDICATION  *UNUSUAL SHORTNESS OF BREATH  *UNUSUAL BRUISING OR BLEEDING  TENDERNESS IN MOUTH AND THROAT WITH OR WITHOUT PRESENCE OF ULCERS  *URINARY PROBLEMS  *BOWEL PROBLEMS  UNUSUAL RASH Items with * indicate a potential emergency and should be followed up as soon as possible.  Feel free to call the clinic should you have any questions or concerns. The clinic phone number is [phone].  Please show the [place] at check-in to the Emergency Department and triage nurse.  [facility] Discharge Instructions for Patients receiving Home Portable Chemo Pump    **The bag should finish at 46 hours, 96 hours or 7 days. For example, if your pump is scheduled for 46 hours and it was put on at 4pm, it should finish at 2 pm the day it is scheduled to come off regardless of your appointment time.    Estimated time to finish   _________________________ (Have your nurse fill in)     ** if the display on your pump reads "Low Volume" and it is beeping, take the batteries out of the pump and come to the cancer center for it to be taken off.   **If the pump alarms go off prior to the pump reading "Low Volume" then call the [phone] and someone can assist you.  **If the plunger comes out and the bag fluid is running out, please use your chemo spill kit to clean up the  spill. Do not use paper towels or other house hold products.  ** If you have problems or questions regarding your pump, please call either the [phone] or the cancer center Monday-Friday 8:00am-4:30pm at [phone] and we will assist you.  If you are unable to get assistance then go to [facility], ask the staff to contact the IV team for assistance.

## 2020-01-13 NOTE — Progress Notes (Signed)
Per Dr. Burr Medico ok for treatment today with reduced chemo dose with ANC 1.0, AST 105, ALT 90. Blood return noted before, during, and after 5FU IV injection.

## 2020-01-14 ENCOUNTER — Telehealth: Payer: Self-pay | Admitting: Hematology and Oncology

## 2020-01-14 NOTE — Telephone Encounter (Signed)
Cancelled appts on 9/20 per 9/13 staff message. Pt is aware of appts cancelled.

## 2020-01-15 ENCOUNTER — Inpatient Hospital Stay: Payer: Medicare Other

## 2020-01-15 ENCOUNTER — Other Ambulatory Visit: Payer: Self-pay

## 2020-01-15 VITALS — BP 155/73 | HR 68 | Temp 99.4°F

## 2020-01-15 DIAGNOSIS — Z23 Encounter for immunization: Secondary | ICD-10-CM | POA: Diagnosis not present

## 2020-01-15 DIAGNOSIS — Z923 Personal history of irradiation: Secondary | ICD-10-CM | POA: Diagnosis not present

## 2020-01-15 DIAGNOSIS — C182 Malignant neoplasm of ascending colon: Secondary | ICD-10-CM

## 2020-01-15 DIAGNOSIS — I10 Essential (primary) hypertension: Secondary | ICD-10-CM | POA: Diagnosis not present

## 2020-01-15 DIAGNOSIS — T451X5A Adverse effect of antineoplastic and immunosuppressive drugs, initial encounter: Secondary | ICD-10-CM | POA: Diagnosis not present

## 2020-01-15 DIAGNOSIS — Z170 Estrogen receptor positive status [ER+]: Secondary | ICD-10-CM | POA: Diagnosis not present

## 2020-01-15 DIAGNOSIS — R600 Localized edema: Secondary | ICD-10-CM | POA: Diagnosis not present

## 2020-01-15 DIAGNOSIS — D701 Agranulocytosis secondary to cancer chemotherapy: Secondary | ICD-10-CM | POA: Diagnosis not present

## 2020-01-15 DIAGNOSIS — I700 Atherosclerosis of aorta: Secondary | ICD-10-CM | POA: Diagnosis not present

## 2020-01-15 DIAGNOSIS — Z5189 Encounter for other specified aftercare: Secondary | ICD-10-CM | POA: Diagnosis not present

## 2020-01-15 DIAGNOSIS — C50412 Malignant neoplasm of upper-outer quadrant of left female breast: Secondary | ICD-10-CM | POA: Diagnosis not present

## 2020-01-15 DIAGNOSIS — Z5111 Encounter for antineoplastic chemotherapy: Secondary | ICD-10-CM | POA: Diagnosis not present

## 2020-01-15 DIAGNOSIS — D509 Iron deficiency anemia, unspecified: Secondary | ICD-10-CM | POA: Diagnosis not present

## 2020-01-15 DIAGNOSIS — Z79811 Long term (current) use of aromatase inhibitors: Secondary | ICD-10-CM | POA: Diagnosis not present

## 2020-01-15 DIAGNOSIS — R7401 Elevation of levels of liver transaminase levels: Secondary | ICD-10-CM | POA: Diagnosis not present

## 2020-01-15 DIAGNOSIS — Z9221 Personal history of antineoplastic chemotherapy: Secondary | ICD-10-CM | POA: Diagnosis not present

## 2020-01-15 MED ORDER — PEGFILGRASTIM-CBQV 6 MG/0.6ML ~~LOC~~ SOSY
PREFILLED_SYRINGE | SUBCUTANEOUS | Status: AC
  Filled 2020-01-15: qty 0.6

## 2020-01-15 MED ORDER — SODIUM CHLORIDE 0.9% FLUSH
3.0000 mL | INTRAVENOUS | Status: DC | PRN
  Administered 2020-01-15: 3 mL
  Filled 2020-01-15: qty 10

## 2020-01-15 MED ORDER — HEPARIN SOD (PORK) LOCK FLUSH 100 UNIT/ML IV SOLN
500.0000 [IU] | Freq: Once | INTRAVENOUS | Status: AC | PRN
  Administered 2020-01-15: 500 [IU]
  Filled 2020-01-15: qty 5

## 2020-01-15 MED ORDER — PEGFILGRASTIM-CBQV 6 MG/0.6ML ~~LOC~~ SOSY
6.0000 mg | PREFILLED_SYRINGE | Freq: Once | SUBCUTANEOUS | Status: AC
  Administered 2020-01-15: 6 mg via SUBCUTANEOUS

## 2020-01-15 NOTE — Patient Instructions (Signed)

## 2020-01-17 ENCOUNTER — Inpatient Hospital Stay: Payer: Medicare Other | Admitting: Hematology and Oncology

## 2020-01-17 ENCOUNTER — Inpatient Hospital Stay: Payer: Medicare Other

## 2020-01-20 ENCOUNTER — Other Ambulatory Visit: Payer: Medicare Other

## 2020-01-20 ENCOUNTER — Inpatient Hospital Stay: Payer: Medicare Other | Admitting: Hematology and Oncology

## 2020-01-20 ENCOUNTER — Inpatient Hospital Stay: Payer: Medicare Other

## 2020-01-26 NOTE — Progress Notes (Addendum)
[place]   [phone] Fax:([phone]) [phone]   Clinic Follow up Note   Patient Care Team: Hulan Fess, MD as PCP - General (Family Medicine) Excell Seltzer, MD (Inactive) as Consulting Physician (General Surgery) Rolm Bookbinder, MD as Consulting Physician (General Surgery) Thea Silversmith, MD as Consulting Physician (Radiation Oncology) Holley Bouche, NP (Inactive) as Nurse Practitioner (Nurse Practitioner) Truitt Merle, MD as Consulting Physician (Hematology) Jonnie Finner, RN as Oncology Nurse Navigator Leighton Ruff, MD as Consulting Physician (General Surgery) Nicholas Lose, MD as Consulting Physician (Hematology and Oncology) Leighton Ruff, MD as Consulting Physician (General Surgery) 01/27/2020  CHIEF COMPLAINT: F/u colon cancer   SUMMARY OF ONCOLOGIC HISTORY: Oncology History Overview Note  Cancer Staging Breast cancer of upper-outer quadrant of left female breast [facility]) Staging form: Breast, AJCC 7th Edition - Clinical stage from 06/11/2014: Stage 0 (Tis (DCIS), N0, M0) - Unsigned - Pathologic stage from 07/03/2014: Stage Unknown (Tis (DCIS), NX, cM0) - Signed by Enid Cutter, MD on 07/10/2014 Staging comments: Staged on final lumpectomy specimen by Dr. Donato Heinz.  right colon cancer Staging form: Colon and Rectum, AJCC 8th Edition - Pathologic stage from 11/01/2019: Stage IIIB (pT3, pN2a, cM0) - Signed by Truitt Merle, MD on 11/29/2019    Breast cancer of upper-outer quadrant of left female breast ([place])  05/29/2014 Initial Biopsy   Left breast needle core biopsy: Grade 2, DCIS with calcs. ER+ (100%), PR+ (96%).    06/04/2014 Initial Diagnosis   Left breast DCIS with calcifications, ER 100%, PR 96%   06/10/2014 Breast MRI   Left breast: 2.4 x 1.3 x 1.1 cm area of patchy non-mass enhancement upper outer quadrant includes postbiopsy seroma; Right breast: 1.2 cm previously biopsied stable benign fibroadenoma   06/12/2014 Procedure   Genetic  counseling/testing: Identified 1 VUS on CHEK2 gene. Remainder of 17 gene panel tested negative and included: ATM, BARD1, BRCA 1/2, BRIP1, CDH1, CHEK2, EPCAM, MLH1, MSH2, MSH6, NBN, NF1, PALB2, PTEN, RAD50, RAD51C, RAD51D, STK11, and TP53.    07/01/2014 Surgery   Left breast lumpectomy ([place]): Grade 1, DCIS, spanning 2.3 cm, 1 mm margin, ER 100%, PR 96%   07/31/2014 - 08/28/2014 Radiation Therapy   Adjuvant RT completed Pablo Ledger). Left breast: Total dose 42.5 Gy over 17 fractions. Left breast boost: Total dose 7.5 Gy over 3 fractions.    09/14/2014 - 01/13/2020 Anti-estrogen oral therapy   Anastrazole $RemoveBefo'1mg'iaLrrBQdYuM$  daily. Planned duration of treatment: 5 years [place]). Completed in 01/2020.    09/25/2014 Survivorship   Survivorship Care Plan given to patient and reviewed with her in person.    right colon cancer  09/19/2019 Imaging   CT AP W contrast 09/19/19  IMPRESSION Fullness in the cecum, cannot exclude a mass. No evidence for metastatic disease is identified.    09/24/2019 Procedure   Colonoscopy by Dr Eber Jones 09/24/19 IMPRESSION 1. The colon was redundant  2. Mild diverticulosis was noted through the entire examined colon 3. Single 20mm polyp was found in the ascending colon; polypectomy was performed using snare cautery and biopsy forceps 4. Mild diverticulosis was notes in the descending colon and sigmoid colon.  5. Single polyp was found in the sigmoid colon, polypectomy was performed with cold forceps.  6. Single polyp was found in the rectosigmoid colon; polypectomy was performed with cold snare  7. Small internal hemorrhoids  8. Large mass was found at the cecum; multiple biopsies of the area were performed using cold forceps; injection (tattooing) was performed distal to the mass.  09/24/2019 Initial Biopsy   INTERPRETATION AND DIAGNOSIS:  A. Cecum, biopsy:  Invasive moderately differentiated adenocarcinoma.  see comment  B. Polyp @ ascending colon, polypectomy:  Tubular Adenoma   C. Polyp @ sigmoid colon Polypectomy:  hyperplastic polyp.  D. Polyp @ rectosigmoid colon, Polypectomy:  Hyperplastic Polyp      10/16/2019 Imaging   CT Chest IMPRESSION: 1. Multiple small pulmonary nodules measuring 5 mm or less in size in the lungs. These are nonspecific and are typically considered statistically likely benign. However, given the patient's history of primary malignancy, close attention on follow-up studies is recommended to ensure stability. 2. Aortic atherosclerosis, in addition to right coronary artery disease. Assessment for potential risk factor modification, dietary therapy or pharmacologic therapy may be warranted, if clinically indicated. 3. There are calcifications of the aortic valve and mitral annulus. Echocardiographic correlation for evaluation of potential valvular dysfunction may be warranted if clinically indicated. 4. Small hiatal hernia.   Aortic Atherosclerosis (ICD10-I70.0).   11/01/2019 Initial Diagnosis   Colon cancer ([place])   11/01/2019 Surgery   LAPAROSCOPIC PARTIAL COLECTOMY by Dr Marcello Moores and Dr Johney Maine   11/01/2019 Pathology Results   FINAL MICROSCOPIC DIAGNOSIS:   A. COLON, PROXIMAL RIGHT, COLECTOMY:  - Invasive colonic adenocarcinoma, 5 cm.  - Tumor invades through the muscularis propria into pericolonic tissues.   - Margins of resection are not involved.  - Metastatic carcinoma in (5) of (13) lymph nodes.  - See oncology table.    MSI Stable  Mismatch repair normal  MLH1 - Preserved nuclear expression (greater 50% tumor expression) MSH2 - Preserved nuclear expression (greater 50% tumor expression) MSH6 - Preserved nuclear expression (greater 50% tumor expression) PMS2 - Preserved nuclear expression (greater 50% tumor expression)   11/01/2019 Cancer Staging   Staging form: Colon and Rectum, AJCC 8th Edition - Pathologic stage from 11/01/2019: Stage IIIB (pT3, pN2a, cM0) - Signed by Truitt Merle, MD on 11/29/2019   12/10/2019 Procedure    PAC placed 12/10/19   12/17/2019 -  Chemotherapy   FOLFOX q2weeks starting in 2 weeks starting 12/17/19     CURRENT THERAPY: FOLFOX q2 weeks starting 12/17/19  INTERVAL HISTORY: [name] returns for f/u and treatment as scheduled. She received cycle 3 FOLFOX on 01/13/20 and Udenyca on 01/15/20. Oxaliplatin was dose reduced for elevated LFTs.  Treatment itself is going well.  She does note more progressive exertional dyspnea over the past 2 weeks, especially while doing her yard work, has to take several breaks.  Yesterday she only mowed half the yard and was "maxed out."  Her mild cough she relates to "hayfever" is stable, no chest pain.  Denies fever or chills.  He continues Lasix once daily which has helped her leg edema.  Eating and drinking well.  The roof of her mouth feels "rough" without obvious sores.  Does not limit oral intake.  Cold sensitivity was drinking last 5 days, touch last a little longer.  She has mild residual tingling in her fingertips intermittently.  He can function normally.  Loose stools on treatment are stable, no nausea or vomiting.  Denies any pain.     MEDICAL HISTORY:  Past Medical History:  Diagnosis Date  . Aortic atherosclerosis ([place])   . Arthritis    feet, lower back  . Basal cell carcinoma    arm  . Breast cancer of upper-outer quadrant of left female breast ([place]) 06/04/2014  . Cataract    immature on the left  . Colon cancer ([place]) 08/2019  .  Diabetes mellitus without complication ([place])   . Diverticulosis   . Dizziness    > 46yrs ago;took Antivert   . Family history of anesthesia complication    sister slow to wake up with anesthesia  . Family history of breast cancer   . Family history of colon cancer   . Family history of uterine cancer   . GERD (gastroesophageal reflux disease)    takes occasional TUMs  . History of bronchitis    > 97yrs ago  . History of colon polyps   . History of hiatal hernia    Small noted on CT  . Hypertension    takes  Losartan daily and HCTZ  . Iron deficiency anemia   . Joint pain   . Numbness    to toes on each foot  . Peripheral neuropathy    feet and toes  . Personal history of radiation therapy   . Pulmonary nodules    Noted on CT  . Radiation 07/31/14-08/28/14   Left Breast 20 fxs  . Seasonal allergies    takes Claritin prn  . Urinary frequency   . Vitamin D deficiency    takes VIt D daily    SURGICAL HISTORY: Past Surgical History:  Procedure Laterality Date  . BREAST BIOPSY Bilateral   . BREAST LUMPECTOMY Left   . BREAST LUMPECTOMY WITH RADIOACTIVE SEED LOCALIZATION Left 07/01/2014   Procedure: LEFT BREAST LUMPECTOMY WITH RADIOACTIVE SEED LOCALIZATION;  Surgeon: Excell Seltzer, MD;  Location: [facility];  Service: General;  Laterality: Left;  . CATARACT EXTRACTION Right   . COLONOSCOPY    . LAPAROSCOPIC PARTIAL COLECTOMY N/A 11/01/2019   Procedure: LAPAROSCOPIC PARTIAL COLECTOMY;  Surgeon: Leighton Ruff, MD;  Location: [facility];  Service: General;  Laterality: N/A;  . PORTACATH PLACEMENT N/A 12/10/2019   Procedure: INSERTION PORT-A-CATH ULTRASOUND GUIDED IN RIGHT IJ;  Surgeon: Leighton Ruff, MD;  Location: [facility];  Service: General;  Laterality: N/A;  . TOTAL KNEE ARTHROPLASTY Left 10/24/2012   Procedure: TOTAL KNEE ARTHROPLASTY;  Surgeon: Kerin Salen, MD;  Location: [place];  Service: Orthopedics;  Laterality: Left;  . TOTAL KNEE ARTHROPLASTY Right 01/09/2013   Procedure: TOTAL KNEE ARTHROPLASTY;  Surgeon: Kerin Salen, MD;  Location: [place];  Service: Orthopedics;  Laterality: Right;  . TUBAL LIGATION      I have reviewed the social history and family history with the patient and they are unchanged from previous note.  ALLERGIES:  is allergic to oxycodone.  MEDICATIONS:  No current facility-administered medications for this visit.   Current Outpatient Medications  Medication Sig Dispense Refill  . aspirin EC 81 MG tablet Take 81 mg by mouth daily. Swallow whole.     . Cholecalciferol (VITAMIN D) 2000 UNITS CAPS Take 2,000 Units by mouth daily.     . Ferrous Sulfate (SLOW FE PO) Take 1 tablet by mouth in the morning and at bedtime.     . furosemide (LASIX) 20 MG tablet Take 1 tablet (20 mg total) by mouth daily. 30 tablet 0  . ibuprofen (ADVIL) 200 MG tablet Take 400 mg by mouth every 6 (six) hours as needed for moderate pain.     Marland Kitchen lidocaine-prilocaine (EMLA) cream Apply to affected area once (Patient taking differently: Apply 1 application topically daily as needed (port access). ) 30 g 3  . loratadine (CLARITIN) 10 MG tablet Take 10 mg by mouth daily as needed for allergies.    Marland Kitchen losartan (COZAAR) 25 MG tablet Take  25 mg by mouth daily.    . metFORMIN (GLUCOPHAGE) 500 MG tablet Take 1 tablet (500 mg total) by mouth daily with breakfast. (Patient taking differently: Take 500 mg by mouth in the morning and at bedtime. )    . Multiple Vitamins-Minerals (MULTIVITAMIN WITH MINERALS) tablet Take 1 tablet by mouth daily.    Marland Kitchen omeprazole (PRILOSEC) 20 MG capsule Take 20 mg by mouth daily before breakfast.     . potassium chloride (KLOR-CON) 10 MEQ tablet Take 1 tablet (10 mEq total) by mouth daily. 30 tablet 0  . pravastatin (PRAVACHOL) 10 MG tablet Take 10 mg by mouth daily.    Marland Kitchen acetaminophen (TYLENOL) 500 MG tablet Take 2 tablets (1,000 mg total) by mouth every 6 (six) hours as needed. (Patient not taking: Reported on 12/03/2019) 30 tablet 0  . ondansetron (ZOFRAN) 8 MG tablet Take 1 tablet (8 mg total) by mouth 2 (two) times daily as needed for refractory nausea / vomiting. Start on day 3 after chemotherapy. 30 tablet 1  . prochlorperazine (COMPAZINE) 10 MG tablet Take 1 tablet (10 mg total) by mouth every 6 (six) hours as needed (Nausea or vomiting). 30 tablet 1   Facility-Administered Medications Ordered in Other Visits  Medication Dose Route Frequency Provider Last Rate Last Admin  . heparin ADULT infusion 100 units/mL (25000 units/22mL sodium chloride  0.45%)  1,400 Units/hr Intravenous Continuous Lenis Noon, RPH 14 mL/hr at 01/27/20 1403 1,400 Units/hr at 01/27/20 1403  . [START ON 01/28/2020] influenza vaccine adjuvanted (FLUAD) injection 0.5 mL  0.5 mL Intramuscular Tomorrow-1000 Kyle, Tyrone A, DO      . sodium chloride (PF) 0.9 % injection             PHYSICAL EXAMINATION: ECOG PERFORMANCE STATUS: 1 - Symptomatic but completely ambulatory  Vitals:   01/27/20 0848  BP: (!) 155/52  Pulse: 72  Resp: 20  Temp: 98.2 F (36.8 C)  SpO2: 96%   Filed Weights   01/27/20 0848  Weight: 242 lb 9.6 oz (110 kg)    GENERAL:alert, no distress and comfortable SKIN: No rash to exposed skin EYES: sclera clear LUNGS: clear with normal breathing effort HEART: regular rate & rhythm, mild lower extremity edema NEURO: alert & oriented x 3 with fluent speech, no focal motor deficits.  Mildly decreased vibratory sense over the fingertips per tuning fork exam PAC without erythema  LABORATORY DATA:  I have reviewed the data as listed CBC Latest Ref Rng & Units 01/27/2020 01/13/2020 12/30/2019  WBC 4.0 - 10.5 K/uL 16.5(H) 3.7(L) 5.5  Hemoglobin 12.0 - 15.0 g/dL 11.1(L) 10.9(L) 11.2(L)  Hematocrit 36 - 46 % 34.0(L) 33.5(L) 34.2(L)  Platelets 150 - 400 K/uL 173 153 220     CMP Latest Ref Rng & Units 01/27/2020 01/13/2020 12/30/2019  Glucose 70 - 99 mg/dL 145(H) 161(H) 137(H)  BUN 8 - 23 mg/dL $Remove'15 14 16  'TxIsFLl$ Creatinine 0.44 - 1.00 mg/dL 0.82 0.79 0.76  Sodium 135 - 145 mmol/L 142 142 141  Potassium 3.5 - 5.1 mmol/L 4.0 3.1(L) 3.7  Chloride 98 - 111 mmol/L 109 110 109  CO2 22 - 32 mmol/L $RemoveB'26 24 22  'ZdEkGWZI$ Calcium 8.9 - 10.3 mg/dL 9.3 9.0 9.7  Total Protein 6.5 - 8.1 g/dL 6.7 6.7 6.9  Total Bilirubin 0.3 - 1.2 mg/dL 0.9 1.3(H) 0.8  Alkaline Phos 38 - 126 U/L 131(H) 151(H) 125  AST 15 - 41 U/L 50(H) 105(H) 44(H)  ALT 0 - 44 U/L 38 90(H) 41  RADIOGRAPHIC STUDIES: I have personally reviewed the radiological images as listed and agreed with the  findings in the report. DG Chest 2 View  Result Date: 01/27/2020 CLINICAL DATA:  Dyspnea for 2 weeks EXAM: CHEST - 2 VIEW COMPARISON:  12/10/2019 FINDINGS: Right IJ Port-A-Cath, unchanged in positioning. The heart size and mediastinal contours are within normal limits. No focal airspace consolidation, pleural effusion, or pneumothorax. The visualized skeletal structures are unremarkable. IMPRESSION: No active cardiopulmonary disease. Electronically Signed   By: Davina Poke D.O.   On: 01/27/2020 10:51   CT Angio Chest PE W and/or Wo Contrast  Result Date: 01/27/2020 CLINICAL DATA:  Cough, shortness of breath EXAM: CT ANGIOGRAPHY CHEST WITH CONTRAST TECHNIQUE: Multidetector CT imaging of the chest was performed using the standard protocol during bolus administration of intravenous contrast. Multiplanar CT image reconstructions and MIPs were obtained to evaluate the vascular anatomy. CONTRAST:  137mL OMNIPAQUE IOHEXOL 350 MG/ML SOLN COMPARISON:  10/16/2019 FINDINGS: Cardiovascular: Filling defects are noted within the pulmonary arteries in the lower lobes and right middle lobe compatible with bilateral pulmonary emboli. Slight elevated RV: LV ratio at 0.98 suggesting mild right heart strain. Heart is mildly enlarged. Aorta normal caliber. Mediastinum/Nodes: No mediastinal, hilar, or axillary adenopathy. Trachea and esophagus are unremarkable. Left thyroid nodule measures up to 2.4 cm. Lungs/Pleura: Scarring noted in the left upper lobe/lingula peripherally possibly related to prior left breast radiation. No acute confluent opacities or effusions. Upper Abdomen: Imaging into the upper abdomen demonstrates no acute findings. Musculoskeletal: Chest wall soft tissues are unremarkable. No acute bony abnormality. Review of the MIP images confirms the above findings. IMPRESSION: Positive for acute PE with CTevidence of right heart strain (RV/LV Ratio = 0.98) consistent with at least submassive (intermediate risk)  PE. The presence of right heart strain has been associated with an increased risk of morbidity and mortality. 2.4 cm left thyroid nodule. Recommend thyroid US (ref: J Am Coll Radiol. 2015 Feb;12(2): 143-50). These results were called by telephone at the time of interpretation on 01/27/2020 at 1:18 pm to provider [facility] , who verbally acknowledged these results. Electronically Signed   By: Rolm Baptise M.D.   On: 01/27/2020 13:21   ECHOCARDIOGRAM COMPLETE  Result Date: 01/27/2020    ECHOCARDIOGRAM REPORT   Patient Name:   TWYLIA OKA [facility] Date of Exam: 01/27/2020 Medical Rec #:  [id number]          Height:       66.0 in Accession #:    [id number]         Weight:       242.6 lb Date of Birth:  07/17/1945          BSA:          2.171 m Patient Age:    42 years           BP:           150/98 mmHg Patient Gender: F                  HR:           82 bpm. Exam Location:  Inpatient Procedure: 2D Echo Indications:    Pulmonary Embolus I26.99  History:        Patient has no prior history of Echocardiogram examinations.                 Risk Factors:Hypertension and Diabetes.  Sonographer:    Mikki Santee RDCS (AE) Referring Phys: [phone] Ewa Gentry  A BELAYA IMPRESSIONS  1. Left ventricular ejection fraction, by estimation, is 65 to 70%. The left ventricle has normal function. The left ventricle has no regional wall motion abnormalities. Left ventricular diastolic parameters are consistent with Grade I diastolic dysfunction (impaired relaxation). Elevated left atrial pressure.  2. Right ventricular systolic function is normal. The right ventricular size is mildly enlarged. There is mildly elevated pulmonary artery systolic pressure. The estimated right ventricular systolic pressure is 15.1 mmHg.  3. Left atrial size was mildly dilated.  4. The mitral valve is normal in structure. Trivial mitral valve regurgitation. No evidence of mitral stenosis.  5. The aortic valve is normal in structure. There is moderate  calcification of the aortic valve. There is moderate thickening of the aortic valve. Aortic valve regurgitation is not visualized. No aortic stenosis is present.  6. The inferior vena cava is normal in size with greater than 50% respiratory variability, suggesting right atrial pressure of 3 mmHg. FINDINGS  Left Ventricle: Left ventricular ejection fraction, by estimation, is 65 to 70%. The left ventricle has normal function. The left ventricle has no regional wall motion abnormalities. The left ventricular internal cavity size was normal in size. There is  no left ventricular hypertrophy. Left ventricular diastolic parameters are consistent with Grade I diastolic dysfunction (impaired relaxation). Elevated left atrial pressure. Right Ventricle: The right ventricular size is mildly enlarged. No increase in right ventricular wall thickness. Right ventricular systolic function is normal. There is mildly elevated pulmonary artery systolic pressure. The tricuspid regurgitant velocity is 2.57 m/s, and with an assumed right atrial pressure of 8 mmHg, the estimated right ventricular systolic pressure is 76.1 mmHg. Left Atrium: Left atrial size was mildly dilated. Right Atrium: Right atrial size was normal in size. Pericardium: There is no evidence of pericardial effusion. Mitral Valve: The mitral valve is normal in structure. There is mild thickening of the mitral valve leaflet(s). There is mild calcification of the mitral valve leaflet(s). Trivial mitral valve regurgitation. No evidence of mitral valve stenosis. Tricuspid Valve: The tricuspid valve is normal in structure. Tricuspid valve regurgitation is mild . No evidence of tricuspid stenosis. Aortic Valve: The aortic valve is normal in structure. There is moderate calcification of the aortic valve. There is moderate thickening of the aortic valve. Aortic valve regurgitation is not visualized. No aortic stenosis is present. Pulmonic Valve: The pulmonic valve was normal  in structure. Pulmonic valve regurgitation is not visualized. No evidence of pulmonic stenosis. Aorta: The aortic root is normal in size and structure. Venous: The inferior vena cava is normal in size with greater than 50% respiratory variability, suggesting right atrial pressure of 3 mmHg. IAS/Shunts: No atrial level shunt detected by color flow Doppler.  LEFT VENTRICLE PLAX 2D LVIDd:         4.90 cm  Diastology LVIDs:         3.00 cm  LV e' medial:    5.87 cm/s LV PW:         1.00 cm  LV E/e' medial:  14.7 LV IVS:        1.00 cm  LV e' lateral:   8.92 cm/s LVOT diam:     2.30 cm  LV E/e' lateral: 9.7 LV SV:         90 LV SV Index:   42 LVOT Area:     4.15 cm  RIGHT VENTRICLE RV S prime:     12.60 cm/s TAPSE (M-mode): 2.3 cm LEFT ATRIUM  Index       RIGHT ATRIUM           Index LA diam:        4.00 cm 1.84 cm/m  RA Area:     11.90 cm LA Vol (A2C):   83.1 ml 38.28 ml/m RA Volume:   24.40 ml  11.24 ml/m LA Vol (A4C):   53.0 ml 24.41 ml/m LA Biplane Vol: 65.7 ml 30.26 ml/m  AORTIC VALVE LVOT Vmax:   83.00 cm/s LVOT Vmean:  58.800 cm/s LVOT VTI:    0.217 m  AORTA Ao Root diam: 3.10 cm MITRAL VALVE                TRICUSPID VALVE MV Area (PHT): 2.24 cm     TR Peak grad:   26.4 mmHg MV Decel Time: 338 msec     TR Vmax:        257.00 cm/s MV E velocity: 86.30 cm/s MV A velocity: 113.00 cm/s  SHUNTS MV E/A ratio:  0.76         Systemic VTI:  0.22 m                             Systemic Diam: 2.30 cm Tobias Alexander MD Electronically signed by Tobias Alexander MD Signature Date/Time: 01/27/2020/3:56:42 PM    Final      ASSESSMENT & PLAN: Keirsten Matuska McCoyis a 74 y.o.femalewith   1. Progressive DOE -Began after cycle 2 chemo, worsened in past 2 weeks, limiting activities -stable cough, she attributes to allergens  -on ASA 81 mg -desat to 88% in clinic while ambulating on RA -Hgb 11.1, D dimer 5.97  -CXR negative -hold chemo -She was referred to ED for urgent eval, CTA positive for acute PE  with right heart strain, c/w at least submassive PE. She is being admitted for further management   2.Right colon cancer, pT3N2aM0 stage IIB, MSS -She was diagnosed in 08/2019 withcecal mass biopsy showed moderately differentiated adenocarcinoma. InitialCTAPshowed no evidence of lymph node or distant metastasis, or radiographic concern for bowel obstruction. -CT chest from 10/16/19 did shows multiple small lung nodules that were nonspecific but likely benign. Will monitor.  -She underwent colon surgery with Dr Maisie Fus on 11/01/19.Path showed5cm of invasive colonic adenocarcinoma that was completely resected, clear margins with 5/13 LNs. Overall Stage IIIB cancer.  -Given the lymph node involvement her recurrence risk is very high, Dr. Blake Divine has recommended adjuvant chemotherapy for 6 months to reduce her recurrence risk. She previously discussed Capox versus FOLFOX and the patient consented to FOLFOX.The goal of chemo is curative. -The goal is 6 months of treatment if she can tolerate then proceed with surveillance including CT scan every 6 months for first 2-3 years;will monitor lung nodules -She has recovered very well from surgery. Her baseline neuropathy will be monitor closely on treatment -Began adjuvant chemo FOLFOX cycle 1 on 12/17/2019, tolerated well.   -she has cold sensitivity lasting 5+ days, now with mild residual neuropathy with mild vibratory deficits. I recommend to keep oxaliplatin at the current dose reduction 70 mg/m2 for now and monitor closely  -She has Progressive exertional dyspnea on chemo, ED work up today shows acute PE. Holding chemo for inpatient management.   3.Iron deficient anemia -Her 10/03/19 Iron panel showed, Ferritin 13, Iron 34, sat ratios 8. Overall consistent with iron deficiency. -She did require blood transfusion on 11/06/19 after colon surgery. -Takes 2 tabs Slow Fe  iron daily -Iron studies normalized, remain WNL and stable (01/27/20)  4.H/o left  breast DCIS, G2, ER/PR+ -Dx in 05/2014.S/p left lumpectomy with Dr Excell Seltzer, adjuvant RT with Dr Pablo Ledger. She completed adjuvant Anastrozole 08/2014 -01/2020, tolerated well. -Bone density 06/09/2015 T score +1.1 normal. Plan to repeat DEXA in 2021. -We will continue long-term surveillance with [place]  5. Comorbidities: Arthritis, DM, HTN, GERD -f/u with PCP Dr. Rex Kras  Disposition: Ms. Zbikowski appears stable.  She completed 3 cycles of adjuvant FOLFOX, oxaliplatin was dose reduced and G-CSF added with cycle 3.  She tolerates treatment well overall with cold sensitivity and grade 2 neuropathy.  She is able to function well.  She has developed progressive exertional dyspnea with mild cough.  Her oxygen desaturates to 88% ambulating in clinic.  Chest x-ray is negative. Hg 11.1. D-dimer elevated to 5.47.  She was referred for urgent eval in ED, CTA positive for acute PE with right heart strain. She is being admitted for further management.   We reviewed the CBC and CMP from today. She has mild leukocytosis likely from GCSF. transaminitis nearly resolved. Iron studies normal.   We will reschedule f/u and chemo pending hospital discharge.  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 The patient was seen with Dr. Burr Medico.     Orders Placed This Encounter  Procedures  . DG Chest 2 View    Standing Status:   Future    Number of Occurrences:   1    Standing Expiration Date:   01/26/2021    Order Specific Question:   Reason for Exam (SYMPTOM  OR DIAGNOSIS REQUIRED)    Answer:   dyspnea, decreased oxygenation during ambulation    Order Specific Question:   Preferred imaging location?    Answer:   [facility]    Order Specific Question:   Radiology Contrast Protocol - do NOT remove file path    Answer:   [email or web address]  . D-dimer, quantitative    Oxygen desaturation with  ambulation, dyspnea    Standing Status:   Future    Number of Occurrences:   1    Standing Expiration Date:   01/26/2021   All questions were answered. The patient knows to call the clinic with any problems, questions or concerns. No barriers to learning were detected.     Alla Feeling, NP 01/27/20   Addendum  I have seen the patient, examined her. I agree with the assessment and and plan and have edited the notes.   Pt developed worsening dyspnea on exertion during the past 2 weeks, no chest pain or cough.  Work-up today was concerning for pulmonary embolism.  She desaturated on walking.  We discussed chemotherapy increased risk of thrombosis.  We will send her to the emergency room for CTA and management, will hold chemo today, she agrees with the plan.  Truitt Merle  01/27/2020

## 2020-01-27 ENCOUNTER — Other Ambulatory Visit: Payer: Self-pay

## 2020-01-27 ENCOUNTER — Emergency Department (HOSPITAL_COMMUNITY): Payer: Medicare Other

## 2020-01-27 ENCOUNTER — Inpatient Hospital Stay: Payer: Medicare Other

## 2020-01-27 ENCOUNTER — Ambulatory Visit (HOSPITAL_COMMUNITY)
Admission: RE | Admit: 2020-01-27 | Discharge: 2020-01-27 | Disposition: A | Discharge status: Home / Self Care | Payer: Medicare Other | Source: Ambulatory Visit | Attending: Nurse Practitioner | Admitting: Nurse Practitioner

## 2020-01-27 ENCOUNTER — Encounter (HOSPITAL_COMMUNITY): Payer: Self-pay

## 2020-01-27 ENCOUNTER — Encounter: Payer: Self-pay | Admitting: Nurse Practitioner

## 2020-01-27 ENCOUNTER — Inpatient Hospital Stay: Payer: Medicare Other | Admitting: Nurse Practitioner

## 2020-01-27 ENCOUNTER — Inpatient Hospital Stay (HOSPITAL_COMMUNITY)
Admission: EM | Admit: 2020-01-27 | Discharge: 2020-01-30 | DRG: 176 | Disposition: A | Discharge status: Home / Self Care | Payer: Medicare Other | Attending: Internal Medicine | Admitting: Internal Medicine

## 2020-01-27 VITALS — BP 155/52 | HR 72 | Temp 98.2°F | Resp 20 | Ht 66.0 in | Wt 242.6 lb

## 2020-01-27 DIAGNOSIS — R7401 Elevation of levels of liver transaminase levels: Secondary | ICD-10-CM | POA: Diagnosis not present

## 2020-01-27 DIAGNOSIS — C182 Malignant neoplasm of ascending colon: Secondary | ICD-10-CM

## 2020-01-27 DIAGNOSIS — Z9221 Personal history of antineoplastic chemotherapy: Secondary | ICD-10-CM

## 2020-01-27 DIAGNOSIS — I159 Secondary hypertension, unspecified: Secondary | ICD-10-CM | POA: Diagnosis not present

## 2020-01-27 DIAGNOSIS — I2609 Other pulmonary embolism with acute cor pulmonale: Secondary | ICD-10-CM

## 2020-01-27 DIAGNOSIS — R0600 Dyspnea, unspecified: Secondary | ICD-10-CM

## 2020-01-27 DIAGNOSIS — R0609 Other forms of dyspnea: Secondary | ICD-10-CM

## 2020-01-27 DIAGNOSIS — Z96653 Presence of artificial knee joint, bilateral: Secondary | ICD-10-CM | POA: Diagnosis present

## 2020-01-27 DIAGNOSIS — R609 Edema, unspecified: Secondary | ICD-10-CM | POA: Diagnosis not present

## 2020-01-27 DIAGNOSIS — E785 Hyperlipidemia, unspecified: Secondary | ICD-10-CM | POA: Diagnosis present

## 2020-01-27 DIAGNOSIS — E1142 Type 2 diabetes mellitus with diabetic polyneuropathy: Secondary | ICD-10-CM | POA: Diagnosis present

## 2020-01-27 DIAGNOSIS — R0602 Shortness of breath: Secondary | ICD-10-CM | POA: Diagnosis not present

## 2020-01-27 DIAGNOSIS — Z7984 Long term (current) use of oral hypoglycemic drugs: Secondary | ICD-10-CM

## 2020-01-27 DIAGNOSIS — Z170 Estrogen receptor positive status [ER+]: Secondary | ICD-10-CM | POA: Diagnosis not present

## 2020-01-27 DIAGNOSIS — I361 Nonrheumatic tricuspid (valve) insufficiency: Secondary | ICD-10-CM | POA: Diagnosis not present

## 2020-01-27 DIAGNOSIS — I10 Essential (primary) hypertension: Secondary | ICD-10-CM | POA: Diagnosis present

## 2020-01-27 DIAGNOSIS — Z79811 Long term (current) use of aromatase inhibitors: Secondary | ICD-10-CM | POA: Diagnosis not present

## 2020-01-27 DIAGNOSIS — C50919 Malignant neoplasm of unspecified site of unspecified female breast: Secondary | ICD-10-CM | POA: Diagnosis not present

## 2020-01-27 DIAGNOSIS — E1169 Type 2 diabetes mellitus with other specified complication: Secondary | ICD-10-CM | POA: Diagnosis not present

## 2020-01-27 DIAGNOSIS — I2699 Other pulmonary embolism without acute cor pulmonale: Secondary | ICD-10-CM | POA: Diagnosis not present

## 2020-01-27 DIAGNOSIS — Z5189 Encounter for other specified aftercare: Secondary | ICD-10-CM | POA: Diagnosis not present

## 2020-01-27 DIAGNOSIS — Z79899 Other long term (current) drug therapy: Secondary | ICD-10-CM | POA: Diagnosis not present

## 2020-01-27 DIAGNOSIS — C189 Malignant neoplasm of colon, unspecified: Secondary | ICD-10-CM | POA: Diagnosis not present

## 2020-01-27 DIAGNOSIS — N390 Urinary tract infection, site not specified: Secondary | ICD-10-CM | POA: Diagnosis present

## 2020-01-27 DIAGNOSIS — Z7982 Long term (current) use of aspirin: Secondary | ICD-10-CM | POA: Diagnosis not present

## 2020-01-27 DIAGNOSIS — K219 Gastro-esophageal reflux disease without esophagitis: Secondary | ICD-10-CM | POA: Diagnosis not present

## 2020-01-27 DIAGNOSIS — Z853 Personal history of malignant neoplasm of breast: Secondary | ICD-10-CM | POA: Diagnosis not present

## 2020-01-27 DIAGNOSIS — Z923 Personal history of irradiation: Secondary | ICD-10-CM | POA: Diagnosis not present

## 2020-01-27 DIAGNOSIS — Z23 Encounter for immunization: Secondary | ICD-10-CM | POA: Diagnosis not present

## 2020-01-27 DIAGNOSIS — Z20822 Contact with and (suspected) exposure to covid-19: Secondary | ICD-10-CM | POA: Diagnosis present

## 2020-01-27 DIAGNOSIS — T451X5A Adverse effect of antineoplastic and immunosuppressive drugs, initial encounter: Secondary | ICD-10-CM | POA: Diagnosis not present

## 2020-01-27 DIAGNOSIS — D509 Iron deficiency anemia, unspecified: Secondary | ICD-10-CM | POA: Diagnosis present

## 2020-01-27 DIAGNOSIS — C50412 Malignant neoplasm of upper-outer quadrant of left female breast: Secondary | ICD-10-CM | POA: Diagnosis not present

## 2020-01-27 DIAGNOSIS — R600 Localized edema: Secondary | ICD-10-CM | POA: Diagnosis not present

## 2020-01-27 DIAGNOSIS — Z5111 Encounter for antineoplastic chemotherapy: Secondary | ICD-10-CM | POA: Diagnosis not present

## 2020-01-27 DIAGNOSIS — D701 Agranulocytosis secondary to cancer chemotherapy: Secondary | ICD-10-CM | POA: Diagnosis not present

## 2020-01-27 DIAGNOSIS — I700 Atherosclerosis of aorta: Secondary | ICD-10-CM | POA: Diagnosis not present

## 2020-01-27 DIAGNOSIS — Z86711 Personal history of pulmonary embolism: Secondary | ICD-10-CM | POA: Diagnosis present

## 2020-01-27 LAB — CMP (CANCER CENTER ONLY)
ALT: 38 U/L (ref 0–44)
AST: 50 U/L — ABNORMAL HIGH (ref 15–41)
Albumin: 3.3 g/dL — ABNORMAL LOW (ref 3.5–5.0)
Alkaline Phosphatase: 131 U/L — ABNORMAL HIGH (ref 38–126)
Anion gap: 7 (ref 5–15)
BUN: 15 mg/dL (ref 8–23)
CO2: 26 mmol/L (ref 22–32)
Calcium: 9.3 mg/dL (ref 8.9–10.3)
Chloride: 109 mmol/L (ref 98–111)
Creatinine: 0.82 mg/dL (ref 0.44–1.00)
GFR, Est AFR Am: >60 mL/min (ref >60)
GFR, Estimated: >60 mL/min (ref >60)
Glucose, Bld: 145 mg/dL — ABNORMAL HIGH (ref 70–99)
Potassium: 4 mmol/L (ref 3.5–5.1)
Sodium: 142 mmol/L (ref 135–145)
Total Bilirubin: 0.9 mg/dL (ref 0.3–1.2)
Total Protein: 6.7 g/dL (ref 6.5–8.1)

## 2020-01-27 LAB — RESPIRATORY PANEL BY RT PCR (FLU A&B, COVID)
Influenza A by PCR: NEGATIVE
Influenza B by PCR: NEGATIVE
SARS Coronavirus 2 by RT PCR: NEGATIVE

## 2020-01-27 LAB — CBC WITH DIFFERENTIAL (CANCER CENTER ONLY)
Abs Immature Granulocytes: 1.45 10*3/uL — ABNORMAL HIGH (ref 0.00–0.07)
Basophils Absolute: 0.2 10*3/uL — ABNORMAL HIGH (ref 0.0–0.1)
Basophils Relative: 1 %
Eosinophils Absolute: 0.2 10*3/uL (ref 0.0–0.5)
Eosinophils Relative: 1 %
HCT: 34 % — ABNORMAL LOW (ref 36.0–46.0)
Hemoglobin: 11.1 g/dL — ABNORMAL LOW (ref 12.0–15.0)
Immature Granulocytes: 9 %
Lymphocytes Relative: 25 %
Lymphs Abs: 4.2 10*3/uL — ABNORMAL HIGH (ref 0.7–4.0)
MCH: 30.6 pg (ref 26.0–34.0)
MCHC: 32.6 g/dL (ref 30.0–36.0)
MCV: 93.7 fL (ref 80.0–100.0)
Monocytes Absolute: 0.9 10*3/uL (ref 0.1–1.0)
Monocytes Relative: 6 %
Neutro Abs: 9.6 10*3/uL — ABNORMAL HIGH (ref 1.7–7.7)
Neutrophils Relative %: 58 %
Platelet Count: 173 10*3/uL (ref 150–400)
RBC: 3.63 MIL/uL — ABNORMAL LOW (ref 3.87–5.11)
RDW: 18.6 % — ABNORMAL HIGH (ref 11.5–15.5)
WBC Count: 16.5 10*3/uL — ABNORMAL HIGH (ref 4.0–10.5)
nRBC: 0.2 % (ref 0.0–0.2)

## 2020-01-27 LAB — ECHOCARDIOGRAM COMPLETE
Area-P 1/2: 2.24 cm2
Height: 66 in
S' Lateral: 3 cm
Weight: 3881.6 oz

## 2020-01-27 LAB — IRON AND TIBC
Iron: 97 ug/dL (ref 41–142)
Saturation Ratios: 24 % (ref 21–57)
TIBC: 398 ug/dL (ref 236–444)
UIBC: 301 ug/dL (ref 120–384)

## 2020-01-27 LAB — URINALYSIS, ROUTINE W REFLEX MICROSCOPIC
Bilirubin Urine: NEGATIVE
Glucose, UA: NEGATIVE mg/dL
Ketones, ur: NEGATIVE mg/dL
Nitrite: POSITIVE — AB
Protein, ur: 100 mg/dL — AB
Specific Gravity, Urine: 1.041 — ABNORMAL HIGH (ref 1.005–1.030)
WBC, UA: >50 WBC/hpf — ABNORMAL HIGH (ref 0–5)
pH: 5 (ref 5.0–8.0)

## 2020-01-27 LAB — BRAIN NATRIURETIC PEPTIDE: B Natriuretic Peptide: 150.7 pg/mL — ABNORMAL HIGH (ref 0.0–100.0)

## 2020-01-27 LAB — FERRITIN: Ferritin: 231 ng/mL (ref 11–307)

## 2020-01-27 LAB — PROTIME-INR
INR: 1 (ref 0.8–1.2)
Prothrombin Time: 13 seconds (ref 11.4–15.2)

## 2020-01-27 LAB — CBG MONITORING, ED
Glucose-Capillary: 128 mg/dL — ABNORMAL HIGH (ref 70–99)
Glucose-Capillary: 145 mg/dL — ABNORMAL HIGH (ref 70–99)

## 2020-01-27 LAB — D-DIMER, QUANTITATIVE: D-Dimer, Quant: 5.97 ug/mL-FEU — ABNORMAL HIGH (ref 0.00–0.50)

## 2020-01-27 LAB — TROPONIN I (HIGH SENSITIVITY): Troponin I (High Sensitivity): 11 ng/L (ref <18)

## 2020-01-27 LAB — APTT: aPTT: 22 seconds — ABNORMAL LOW (ref 24–36)

## 2020-01-27 LAB — HEPARIN LEVEL (UNFRACTIONATED): Heparin Unfractionated: 0.21 IU/mL — ABNORMAL LOW (ref 0.30–0.70)

## 2020-01-27 MED ORDER — ONDANSETRON HCL 4 MG/2ML IJ SOLN: 4.0000 mg | Freq: Four times a day (QID) | INTRAMUSCULAR | Status: DC | PRN

## 2020-01-27 MED ORDER — INFLUENZA VAC A&B SA ADJ QUAD 0.5 ML IM PRSY
0.5000 mL | PREFILLED_SYRINGE | INTRAMUSCULAR | Status: AC
  Administered 2020-01-28: 0.5 mL via INTRAMUSCULAR
  Filled 2020-01-27: qty 0.5

## 2020-01-27 MED ORDER — HEPARIN BOLUS VIA INFUSION
2500.0000 [IU] | Freq: Once | INTRAVENOUS | Status: AC
  Administered 2020-01-27: 2500 [IU] via INTRAVENOUS
  Filled 2020-01-27: qty 2500

## 2020-01-27 MED ORDER — FUROSEMIDE 20 MG PO TABS
20.0000 mg | ORAL_TABLET | Freq: Every day | ORAL | Status: DC
  Administered 2020-01-28 – 2020-01-30 (×3): 20 mg via ORAL
  Filled 2020-01-27 (×3): qty 1

## 2020-01-27 MED ORDER — ASPIRIN EC 81 MG PO TBEC
81.0000 mg | DELAYED_RELEASE_TABLET | Freq: Every day | ORAL | Status: DC
  Administered 2020-01-28 – 2020-01-30 (×3): 81 mg via ORAL
  Filled 2020-01-27 (×3): qty 1

## 2020-01-27 MED ORDER — IOHEXOL 350 MG/ML SOLN
100.0000 mL | Freq: Once | INTRAVENOUS | Status: AC | PRN
  Administered 2020-01-27: 100 mL via INTRAVENOUS

## 2020-01-27 MED ORDER — ONDANSETRON HCL 4 MG PO TABS: 4.0000 mg | ORAL_TABLET | Freq: Four times a day (QID) | ORAL | Status: DC | PRN

## 2020-01-27 MED ORDER — INSULIN ASPART 100 UNIT/ML ~~LOC~~ SOLN
0.0000 [IU] | Freq: Every day | SUBCUTANEOUS | Status: DC
  Filled 2020-01-27: qty 0.05

## 2020-01-27 MED ORDER — LOSARTAN POTASSIUM 25 MG PO TABS
25.0000 mg | ORAL_TABLET | Freq: Every day | ORAL | Status: DC
  Administered 2020-01-28 – 2020-01-30 (×3): 25 mg via ORAL
  Filled 2020-01-27 (×3): qty 1

## 2020-01-27 MED ORDER — ACETAMINOPHEN 325 MG PO TABS: 650.0000 mg | ORAL_TABLET | Freq: Four times a day (QID) | ORAL | Status: DC | PRN

## 2020-01-27 MED ORDER — INSULIN ASPART 100 UNIT/ML ~~LOC~~ SOLN
0.0000 [IU] | Freq: Three times a day (TID) | SUBCUTANEOUS | Status: DC
  Administered 2020-01-27: 1 [IU] via SUBCUTANEOUS
  Administered 2020-01-28 (×2): 2 [IU] via SUBCUTANEOUS
  Administered 2020-01-28: 1 [IU] via SUBCUTANEOUS
  Administered 2020-01-29 (×2): 2 [IU] via SUBCUTANEOUS
  Administered 2020-01-29: 1 [IU] via SUBCUTANEOUS
  Administered 2020-01-30 (×2): 2 [IU] via SUBCUTANEOUS
  Filled 2020-01-27: qty 0.09

## 2020-01-27 MED ORDER — SODIUM CHLORIDE (PF) 0.9 % IJ SOLN
INTRAMUSCULAR | Status: AC
  Filled 2020-01-27: qty 50

## 2020-01-27 MED ORDER — HEPARIN (PORCINE) 25000 UT/250ML-% IV SOLN
1500.0000 [IU]/h | INTRAVENOUS | Status: DC
  Administered 2020-01-27: 1400 [IU]/h via INTRAVENOUS
  Administered 2020-01-28: 1700 [IU]/h via INTRAVENOUS
  Filled 2020-01-27 (×2): qty 250

## 2020-01-27 MED ORDER — PRAVASTATIN SODIUM 20 MG PO TABS
10.0000 mg | ORAL_TABLET | Freq: Every day | ORAL | Status: DC
  Administered 2020-01-28 – 2020-01-30 (×3): 10 mg via ORAL
  Filled 2020-01-27 (×3): qty 1

## 2020-01-27 MED ORDER — HEPARIN BOLUS VIA INFUSION
1500.0000 [IU] | Freq: Once | INTRAVENOUS | Status: AC
  Administered 2020-01-28: 1500 [IU] via INTRAVENOUS
  Filled 2020-01-27: qty 1500

## 2020-01-27 MED ORDER — ACETAMINOPHEN 650 MG RE SUPP: 650.0000 mg | Freq: Four times a day (QID) | RECTAL | Status: DC | PRN

## 2020-01-27 NOTE — Progress Notes (Signed)
ANTICOAGULATION CONSULT NOTE - Initial Consult  Pharmacy Consult for heparin Indication: pulmonary embolus  Allergies  Allergen Reactions  . Oxycodone Nausea And Vomiting    Patient Measurements:   Heparin Dosing Weight: 85 kg  Vital Signs: Temp: 98.5 F (36.9 C) (09/27 2245) Temp Source: Oral (09/27 1152) BP: 159/69 (09/27 2245) Pulse Rate: 69 (09/27 2245)  Labs: Recent Labs    01/27/20 0834 01/27/20 1341 01/27/20 2246  HGB 11.1*  --   --   HCT 34.0*  --   --   PLT 173  --   --   APTT  --  22*  --   LABPROT  --  13.0  --   INR  --  1.0  --   HEPARINUNFRC  --   --  0.21*  CREATININE 0.82  --   --   TROPONINIHS  --  11  --     Estimated Creatinine Clearance: 79.1 mL/min (by C-G formula based on SCr of 0.82 mg/dL).   Medical History: Past Medical History:  Diagnosis Date  . Aortic atherosclerosis ([place])   . Arthritis    feet, lower back  . Basal cell carcinoma    arm  . Breast cancer of upper-outer quadrant of left female breast ([place]) 06/04/2014  . Cataract    immature on the left  . Colon cancer ([place]) 08/2019  . Diabetes mellitus without complication ([place])   . Diverticulosis   . Dizziness    > 75yrs ago;took Antivert   . Family history of anesthesia complication    sister slow to wake up with anesthesia  . Family history of breast cancer   . Family history of colon cancer   . Family history of uterine cancer   . GERD (gastroesophageal reflux disease)    takes occasional TUMs  . History of bronchitis    > 22yrs ago  . History of colon polyps   . History of hiatal hernia    Small noted on CT  . Hypertension    takes Losartan daily and HCTZ  . Iron deficiency anemia   . Joint pain   . Numbness    to toes on each foot  . Peripheral neuropathy    feet and toes  . Personal history of radiation therapy   . Pulmonary nodules    Noted on CT  . Radiation 07/31/14-08/28/14   Left Breast 20 fxs  . Seasonal allergies    takes Claritin prn  . Urinary  frequency   . Vitamin D deficiency    takes VIt D daily    Medications: Not on anticoagulants PTA  Assessment: Pt is a 58 yoF presenting from the cancer center with concern for PE. PMH significant for breast and colon cancer.  -9/27 CTA: Acute PE with evidence of right heart strain -Baseline labs: CBC: Hgb 11.1 slightly low; Plt 173 WNL; D-dimer 5.97-elevated; aptt 22, INR 1.0 WNL; SCr 0.82, CrCl ~75 mL/min  01/27/2020:  Initial heparin level 0.21 -SUBtherapeutic on 1400 units/hr  No bleeding or infusion related issues per RN  Goal of Therapy:  Heparin level 0.3-0.7 units/ml Monitor platelets by anticoagulation protocol: Yes   Plan:   Re-bolus 1500 units IV heparin x1  Increase heparin infusion to 1700 units/hr  Check HL in 8 hours  HL, CBC daily while on heparin infusion  Monitor for signs/symptoms of bleeding  Netta Cedars, PharmD 01/27/2020,11:19 PM

## 2020-01-27 NOTE — Patient Instructions (Signed)

## 2020-01-27 NOTE — TOC Initial Note (Signed)
Transition of Care [facility]) - Initial/Assessment Note    Patient Details  Name: Kim Castro MRN: [id number] Date of Birth: 1945/11/08  Transition of [facility]) CM/SW Contact:    Erenest Rasher, RN Phone Number: [phone] 01/27/2020, 3:09 PM  Clinical Narrative:                 TOC CM spoke to pt at bedside. Explained she will receive a 30 day free trial card for Eliquis or Xarelto at time of dc. TOC CM/CSW will check pharmacy to ensure they do carry starter packs for either medication. Sent to team to check benefits/copay for Eliquis or Xarelto.   Expected Discharge Plan: Home/Self Care Barriers to Discharge: Continued Medical Work up   Patient Goals and CMS Choice     Choice offered to / list presented to : Spouse  Expected Discharge Plan and Services Expected Discharge Plan: Home/Self Care In-house Referral: Clinical Social Work Discharge Planning Services: CM Consult, Medication Assistance   Living arrangements for the past 2 months: [place]                                      Prior Living Arrangements/Services Living arrangements for the past 2 months: Single Family Home   Patient language and need for interpreter reviewed:: Yes Do you feel safe going back to the place where you live?: Yes      Need for Family Participation in Patient Care: No (Comment)   Current home services: DME (rolling walker, cane, bedside commode) Criminal Activity/Legal Involvement Pertinent to Current Situation/Hospitalization: No - Comment as needed  Activities of Daily Living      Permission Sought/Granted Permission sought to share information with : Case Manager, PCP, Family Supports Permission granted to share information with : Yes, Verbal Permission Granted  Share Information with NAME: Kinda Pottle     Permission granted to share info w Relationship: husband  Permission granted to share info w Contact Information: [phone]  Emotional  Assessment Appearance:: Appears stated age Attitude/Demeanor/Rapport: Engaged Affect (typically observed): Accepting Orientation: : Oriented to Place, Oriented to Self, Oriented to  Time, Oriented to Situation   Psych Involvement: No (comment)  Admission diagnosis:  Shortness of Breath Patient Active Problem List   Diagnosis Date Noted  . Port-A-Cath in place 12/30/2019  . right colon cancer 11/01/2019  . Genetic testing 07/08/2014  . Family history of breast cancer   . Family history of colon cancer   . Family history of uterine cancer   . Breast cancer of upper-outer quadrant of left female breast ([place]) 06/04/2014  . Arthritis of knee, right 01/09/2013  . Osteoarthritis of left knee 10/24/2012   PCP:  Hulan Fess, MD Pharmacy:   [facility], [address] [place]. [place] [address] Phone: [phone] Fax: [phone]     Social Determinants of Health (SDOH) Interventions    Readmission Risk Interventions No flowsheet data found.

## 2020-01-27 NOTE — Progress Notes (Signed)
ANTICOAGULATION CONSULT NOTE - Initial Consult  Pharmacy Consult for heparin Indication: pulmonary embolus  Allergies  Allergen Reactions  . Oxycodone Nausea And Vomiting    Patient Measurements:   Heparin Dosing Weight: 85 kg  Vital Signs: Temp: 98.5 F (36.9 C) (09/27 1152) Temp Source: Oral (09/27 1152) BP: 146/86 (09/27 1152) Pulse Rate: 72 (09/27 1152)  Labs: Recent Labs    01/27/20 0834  HGB 11.1*  HCT 34.0*  PLT 173  CREATININE 0.82    Estimated Creatinine Clearance: 79.1 mL/min (by C-G formula based on SCr of 0.82 mg/dL).   Medical History: Past Medical History:  Diagnosis Date  . Aortic atherosclerosis ([place])   . Arthritis    feet, lower back  . Basal cell carcinoma    arm  . Breast cancer of upper-outer quadrant of left female breast ([place]) 06/04/2014  . Cataract    immature on the left  . Colon cancer ([place]) 08/2019  . Diabetes mellitus without complication ([place])   . Diverticulosis   . Dizziness    > 65yrs ago;took Antivert   . Family history of anesthesia complication    sister slow to wake up with anesthesia  . Family history of breast cancer   . Family history of colon cancer   . Family history of uterine cancer   . GERD (gastroesophageal reflux disease)    takes occasional TUMs  . History of bronchitis    > 34yrs ago  . History of colon polyps   . History of hiatal hernia    Small noted on CT  . Hypertension    takes Losartan daily and HCTZ  . Iron deficiency anemia   . Joint pain   . Numbness    to toes on each foot  . Peripheral neuropathy    feet and toes  . Personal history of radiation therapy   . Pulmonary nodules    Noted on CT  . Radiation 07/31/14-08/28/14   Left Breast 20 fxs  . Seasonal allergies    takes Claritin prn  . Urinary frequency   . Vitamin D deficiency    takes VIt D daily    Medications: Not on anticoagulants PTA  Assessment: Pt is a 65 yoF presenting from the cancer center with concern for PE. PMH  significant for breast and colon cancer.  -9/27 CTA: Acute PE with evidence of right heart strain  Today, 01/27/20  CBC: Hgb 11.1 slightly low; Plt 173 WNL  D-dimer 5.97  SCr 0.82, CrCl ~75 mL/min  Goal of Therapy:  Heparin level 0.3-0.7 units/ml Monitor platelets by anticoagulation protocol: Yes   Plan:   Heparin bolus 2500 units IV once  Initiate heparin infusion at 1400 units/hr  Check HL in 8 hours  HL, CBC daily while on heparin infusion  Monitor for signs/symptoms of bleeding  Lenis Noon, PharmD 01/27/2020,1:46 PM

## 2020-01-27 NOTE — ED Triage Notes (Signed)
Patient presents to the ER from the cancer center. Patient was sent to the ER to rule out PE and has had a chest x-ray over at the cancer center. Marijean Niemann is the sending provider.

## 2020-01-27 NOTE — Progress Notes (Signed)
  Echocardiogram 2D Echocardiogram has been performed.  Kim Castro 01/27/2020, 3:53 PM

## 2020-01-27 NOTE — ED Notes (Signed)
Pt ambulated to bathroom without assistance 

## 2020-01-27 NOTE — H&P (Addendum)
History and Physical    Kim Castro DJS:[id number] DOB: 06/13/1945 DOA: 01/27/2020  PCP: Hulan Fess, MD  Patient coming from: [place] office  Chief Complaint: Dyspnea  HPI: Kim Castro is a 74 y.o. female with medical history significant of breast/colon CA. Presents with increasing dyspnea over last 5 - 6 weeks. She states that during that time she has found it increasingly difficult to catch her breath, but she didn't think much of it. She brushed it off as a side effect of her chemo. She notes that she is usually able to mow her lawn twice a week. Last week when she tried to [facility], she couldn't complete the task due to dyspnea. She reports no other aggravating or alleviating factors. She reports that she went to the doctor's office today for an infusion. When she was answering some screening questions, the office hit on her c/o dyspnea and recommended she come to the ED.    ED Course: CTA PE was obtained. She was found to be positive for bilat PE w/ right heart strain. She was started on heparin gtt. [facility] was called for admission.   Review of Systems:  She denies CP, palpitations, HA, syncopal episodes. Review of systems is otherwise negative for all not mentioned in HPI.   Past Medical History:  Diagnosis Date  . Aortic atherosclerosis ([place])   . Arthritis    feet, lower back  . Basal cell carcinoma    arm  . Breast cancer of upper-outer quadrant of left female breast ([place]) 06/04/2014  . Cataract    immature on the left  . Colon cancer ([place]) 08/2019  . Diabetes mellitus without complication ([place])   . Diverticulosis   . Dizziness    > 90yrs ago;took Antivert   . Family history of anesthesia complication    sister slow to wake up with anesthesia  . Family history of breast cancer   . Family history of colon cancer   . Family history of uterine cancer   . GERD (gastroesophageal reflux disease)    takes occasional TUMs  . History of bronchitis    > 14yrs ago  .  History of colon polyps   . History of hiatal hernia    Small noted on CT  . Hypertension    takes Losartan daily and HCTZ  . Iron deficiency anemia   . Joint pain   . Numbness    to toes on each foot  . Peripheral neuropathy    feet and toes  . Personal history of radiation therapy   . Pulmonary nodules    Noted on CT  . Radiation 07/31/14-08/28/14   Left Breast 20 fxs  . Seasonal allergies    takes Claritin prn  . Urinary frequency   . Vitamin D deficiency    takes VIt D daily    Past Surgical History:  Procedure Laterality Date  . BREAST BIOPSY Bilateral   . BREAST LUMPECTOMY Left   . BREAST LUMPECTOMY WITH RADIOACTIVE SEED LOCALIZATION Left 07/01/2014   Procedure: LEFT BREAST LUMPECTOMY WITH RADIOACTIVE SEED LOCALIZATION;  Surgeon: Excell Seltzer, MD;  Location: [facility];  Service: General;  Laterality: Left;  . CATARACT EXTRACTION Right   . COLONOSCOPY    . LAPAROSCOPIC PARTIAL COLECTOMY N/A 11/01/2019   Procedure: LAPAROSCOPIC PARTIAL COLECTOMY;  Surgeon: Leighton Ruff, MD;  Location: [facility];  Service: General;  Laterality: N/A;  . PORTACATH PLACEMENT N/A 12/10/2019   Procedure: INSERTION PORT-A-CATH ULTRASOUND GUIDED IN RIGHT IJ;  Surgeon: Leighton Ruff, MD;  Location: [facility];  Service: General;  Laterality: N/A;  . TOTAL KNEE ARTHROPLASTY Left 10/24/2012   Procedure: TOTAL KNEE ARTHROPLASTY;  Surgeon: Kerin Salen, MD;  Location: [facility];  Service: Orthopedics;  Laterality: Left;  . TOTAL KNEE ARTHROPLASTY Right 01/09/2013   Procedure: TOTAL KNEE ARTHROPLASTY;  Surgeon: Kerin Salen, MD;  Location: [facility];  Service: Orthopedics;  Laterality: Right;  . TUBAL LIGATION       reports that she has never smoked. She has never used smokeless tobacco. She reports that she does not drink alcohol and does not use drugs.  Allergies  Allergen Reactions  . Oxycodone Nausea And Vomiting    Family History  Problem Relation Age of Onset  . Uterine cancer Mother         deceased 61  . Breast cancer Sister 84       currently 14  . Colon cancer Brother 65       currently 37  . Cancer Maternal Uncle        unk. primary; deceased 1s  . Thyroid cancer Daughter 77       currently 82; type?  . Colon cancer Daughter   . Breast cancer Sister     Prior to Admission medications   Medication Sig Start Date End Date Taking? Authorizing Provider  aspirin EC 81 MG tablet Take 81 mg by mouth daily. Swallow whole.   Yes [provider]  Cholecalciferol (VITAMIN D) 2000 UNITS CAPS Take 2,000 Units by mouth daily.    Yes [provider]  Ferrous Sulfate (SLOW FE PO) Take 1 tablet by mouth in the morning and at bedtime.    Yes [provider]  furosemide (LASIX) 20 MG tablet Take 1 tablet (20 mg total) by mouth daily. 01/13/20  Yes Truitt Merle, MD  ibuprofen (ADVIL) 200 MG tablet Take 400 mg by mouth every 6 (six) hours as needed for moderate pain.    Yes [provider]  lidocaine-prilocaine (EMLA) cream Apply to affected area once Patient taking differently: Apply 1 application topically daily as needed (port access).  11/30/19  Yes Truitt Merle, MD  loratadine (CLARITIN) 10 MG tablet Take 10 mg by mouth daily as needed for allergies.   Yes [provider]  losartan (COZAAR) 25 MG tablet Take 25 mg by mouth daily.   Yes [provider]  metFORMIN (GLUCOPHAGE) 500 MG tablet Take 1 tablet (500 mg total) by mouth daily with breakfast. Patient taking differently: Take 500 mg by mouth in the morning and at bedtime.  11/06/18  Yes Nicholas Lose, MD  Multiple Vitamins-Minerals (MULTIVITAMIN WITH MINERALS) tablet Take 1 tablet by mouth daily.   Yes [provider]  omeprazole (PRILOSEC) 20 MG capsule Take 20 mg by mouth daily before breakfast.    Yes [provider]  ondansetron (ZOFRAN) 8 MG tablet Take 1 tablet (8 mg total) by mouth 2 (two) times daily as needed for refractory nausea / vomiting. Start on day  3 after chemotherapy. 11/30/19  Yes Truitt Merle, MD  potassium chloride (KLOR-CON) 10 MEQ tablet Take 1 tablet (10 mEq total) by mouth daily. 01/13/20  Yes Truitt Merle, MD  pravastatin (PRAVACHOL) 10 MG tablet Take 10 mg by mouth daily. 10/01/19  Yes [provider]  prochlorperazine (COMPAZINE) 10 MG tablet Take 1 tablet (10 mg total) by mouth every 6 (six) hours as needed (Nausea or vomiting). 11/30/19  Yes Truitt Merle, MD  acetaminophen (  TYLENOL) 500 MG tablet Take 2 tablets (1,000 mg total) by mouth every 6 (six) hours as needed. Patient not taking: Reported on 12/03/2019 7/0/48   Leighton Ruff, MD    Physical Exam: Vitals:   01/27/20 1152 01/27/20 1454  BP: (!) 146/86 (!) 150/98  Pulse: 72 82  Resp: 20 (!) 26  Temp: 98.5 F (36.9 C)   TempSrc: Oral   SpO2: 97% 96%    General: 74 y.o. female resting in bed in NAD Eyes: PERRL, normal sclera ENMT: Nares patent w/o discharge, orophaynx clear, dentition normal, ears w/o discharge/lesions/ulcers Neck: Supple, trachea midline Cardiovascular: RRR, +S1, S2, no m/g/r, equal pulses throughout Respiratory: slightly decreased at bases, no w/r/r, somewhat increased WOB but still on RA GI: BS+, NDNT, no masses noted, no organomegaly noted MSK: No c/c; b/l pedal edema Skin: No rashes, bruises, ulcerations noted Neuro: A&O x 3, no focal deficits Psyc: Appropriate interaction and affect, calm/cooperative  Labs on Admission: I have personally reviewed following labs and imaging studies  CBC: Recent Labs  Lab 01/27/20 0834  WBC 16.5*  NEUTROABS 9.6*  HGB 11.1*  HCT 34.0*  MCV 93.7  PLT 889   Basic Metabolic Panel: Recent Labs  Lab 01/27/20 0834  NA 142  K 4.0  CL 109  CO2 26  GLUCOSE 145*  BUN 15  CREATININE 0.82  CALCIUM 9.3   GFR: Estimated Creatinine Clearance: 79.1 mL/min (by C-G formula based on SCr of 0.82 mg/dL). Liver Function Tests: Recent Labs  Lab 01/27/20 0834  AST 50*  ALT 38  ALKPHOS 131*  BILITOT 0.9   PROT 6.7  ALBUMIN 3.3*   No results for input(s): LIPASE, AMYLASE in the last 168 hours. No results for input(s): AMMONIA in the last 168 hours. Coagulation Profile: Recent Labs  Lab 01/27/20 1341  INR 1.0   Cardiac Enzymes: No results for input(s): CKTOTAL, CKMB, CKMBINDEX, TROPONINI in the last 168 hours. BNP (last 3 results) No results for input(s): PROBNP in the last 8760 hours. HbA1C: No results for input(s): HGBA1C in the last 72 hours. CBG: No results for input(s): GLUCAP in the last 168 hours. Lipid Profile: No results for input(s): CHOL, HDL, LDLCALC, TRIG, CHOLHDL, LDLDIRECT in the last 72 hours. Thyroid Function Tests: No results for input(s): TSH, T4TOTAL, FREET4, T3FREE, THYROIDAB in the last 72 hours. Anemia Panel: Recent Labs    01/27/20 0834  FERRITIN 231  TIBC 398  IRON 97   Urine analysis:    Component Value Date/Time   COLORURINE YELLOW 01/09/2013 1113   APPEARANCEUR CLEAR 01/09/2013 1113   LABSPEC 1.014 01/09/2013 1113   PHURINE 6.5 01/09/2013 1113   GLUCOSEU NEGATIVE 01/09/2013 1113   HGBUR NEGATIVE 01/09/2013 [place] 01/09/2013 1113   KETONESUR NEGATIVE 01/09/2013 1113   PROTEINUR NEGATIVE 01/09/2013 1113   UROBILINOGEN 0.2 01/09/2013 1113   NITRITE NEGATIVE 01/09/2013 1113   LEUKOCYTESUR NEGATIVE 01/09/2013 1113    Radiological Exams on Admission: DG Chest 2 View  Result Date: 01/27/2020 CLINICAL DATA:  Dyspnea for 2 weeks EXAM: CHEST - 2 VIEW COMPARISON:  12/10/2019 FINDINGS: Right IJ Port-A-Cath, unchanged in positioning. The heart size and mediastinal contours are within normal limits. No focal airspace consolidation, pleural effusion, or pneumothorax. The visualized skeletal structures are unremarkable. IMPRESSION: No active cardiopulmonary disease. Electronically Signed   By: Davina Poke D.O.   On: 01/27/2020 10:51   CT Angio Chest PE W and/or Wo Contrast  Result Date: 01/27/2020 CLINICAL DATA:  Cough,  shortness of breath EXAM: CT ANGIOGRAPHY CHEST WITH CONTRAST TECHNIQUE: Multidetector CT imaging of the chest was performed using the standard protocol during bolus administration of intravenous contrast. Multiplanar CT image reconstructions and MIPs were obtained to evaluate the vascular anatomy. CONTRAST:  191mL OMNIPAQUE IOHEXOL 350 MG/ML SOLN COMPARISON:  10/16/2019 FINDINGS: Cardiovascular: Filling defects are noted within the pulmonary arteries in the lower lobes and right middle lobe compatible with bilateral pulmonary emboli. Slight elevated RV: LV ratio at 0.98 suggesting mild right heart strain. Heart is mildly enlarged. Aorta normal caliber. Mediastinum/Nodes: No mediastinal, hilar, or axillary adenopathy. Trachea and esophagus are unremarkable. Left thyroid nodule measures up to 2.4 cm. Lungs/Pleura: Scarring noted in the left upper lobe/lingula peripherally possibly related to prior left breast radiation. No acute confluent opacities or effusions. Upper Abdomen: Imaging into the upper abdomen demonstrates no acute findings. Musculoskeletal: Chest wall soft tissues are unremarkable. No acute bony abnormality. Review of the MIP images confirms the above findings. IMPRESSION: Positive for acute PE with CTevidence of right heart strain (RV/LV Ratio = 0.98) consistent with at least submassive (intermediate risk) PE. The presence of right heart strain has been associated with an increased risk of morbidity and mortality. 2.4 cm left thyroid nodule. Recommend thyroid US (ref: J Am Coll Radiol. 2015 Feb;12(2): 143-50). These results were called by telephone at the time of interpretation on 01/27/2020 at 1:18 pm to provider [facility] , who verbally acknowledged these results. Electronically Signed   By: Rolm Baptise M.D.   On: 01/27/2020 13:21    Assessment/Plan Submassive bilateral PTE w/ right heart strain     - admit to inpatient, telemetry     - heparin gtt     - CTA w/ evidence of right heart  strain, check echo  Breast CA Colon CA     - outpt follow up with onco  Leukocytosis     - reactive?     - no fever, CTA chest w/o infection, no urinary symptoms     - follow  Left thyroid nodule     - 2.4cm nodule noted on CTA chest. Can follow up with thyroid US once in-house w/u for PTE/heart strain complete  HLD     - pravachol  HTN     - losartan, lasix  GERD     - protonix  DM2     - hold home metformin     - SSI, A1c, DM diet, glucose checks  DVT prophylaxis: heparin gtt  Code Status: FULL  Family Communication: None at bedside  Consults called: None  Admission status: Inpatient d/t severity of illness.  Status is: Inpatient  Remains inpatient appropriate because:Inpatient level of care appropriate due to severity of illness   Dispo: The patient is from: Home              Anticipated d/c is to: Home              Anticipated d/c date is: 3 days              Patient currently is not medically stable to d/c.  Jonnie Finner DO [facility]  If 7PM-7AM, please contact night-coverage [email or web address]  01/27/2020, 3:19 PM

## 2020-01-27 NOTE — ED Provider Notes (Signed)
[facility] DEPT Provider Note   CSN: [id number] Arrival date & time: 01/27/20  1140     History Chief Complaint  Patient presents with  . Shortness of Breath  . Fatigue    Kim Castro is a 74 y.o. female.  HPI 74 year old female with a history of breast cancer, colon cancer currently on chemotherapy with colorectal FOLFOX, presents to the ER from the cancer center for rule out of a PE.  Patient states that she has been progressively having shortness of breath on exertion with her ADLs, to the point that she states that 3 days ago when she was gardening she had to stop and catch her breath.  She states that she had an effusion about 2-1/2 weeks ago and since then has been developing the shortness of breath.  She Dors is a cough but states that this is due to her seasonal "hayfever", denies any chest pain, but states that this has been her baseline prior to this shortness of breath.  She states she has some chronic swelling to her lower extremities, and has been put on Lasix recently.  Was seen at the infusion center today for a repeat treatment, but was sent here instead.  CMP CBC and a D-dimer were ordered, D-dimer of 5.97.  Here to be ruled out for PE.  She denies any headache, fevers, chills, productive cough, dysuria, she is vaccinated for Covid x3.  No prior cardiac history, no history of heart failure, no prior history of DVTs or PEs.  Not on anticoagulation.    Past Medical History:  Diagnosis Date  . Aortic atherosclerosis ([place])   . Arthritis    feet, lower back  . Basal cell carcinoma    arm  . Breast cancer of upper-outer quadrant of left female breast ([place]) 06/04/2014  . Cataract    immature on the left  . Colon cancer ([place]) 08/2019  . Diabetes mellitus without complication ([place])   . Diverticulosis   . Dizziness    > 56yr ago;took Antivert   . Family history of anesthesia complication    sister slow to wake up with anesthesia  .  Family history of breast cancer   . Family history of colon cancer   . Family history of uterine cancer   . GERD (gastroesophageal reflux disease)    takes occasional TUMs  . History of bronchitis    > 89yrago  . History of colon polyps   . History of hiatal hernia    Small noted on CT  . Hypertension    takes Losartan daily and HCTZ  . Iron deficiency anemia   . Joint pain   . Numbness    to toes on each foot  . Peripheral neuropathy    feet and toes  . Personal history of radiation therapy   . Pulmonary nodules    Noted on CT  . Radiation 07/31/14-08/28/14   Left Breast 20 fxs  . Seasonal allergies    takes Claritin prn  . Urinary frequency   . Vitamin D deficiency    takes VIt D daily    Patient Active Problem List   Diagnosis Date Noted  . Port-A-Cath in place 12/30/2019  . right colon cancer 11/01/2019  . Genetic testing 07/08/2014  . Family history of breast cancer   . Family history of colon cancer   . Family history of uterine cancer   . Breast cancer of upper[place] quadrant [place] breast [place]  06/04/2014  . Arthritis of knee, right 01/09/2013  . Osteoarthritis of left knee 10/24/2012    Past Surgical History:  Procedure Laterality Date  . BREAST BIOPSY Bilateral   . BREAST LUMPECTOMY Left   . BREAST LUMPECTOMY WITH RADIOACTIVE SEED LOCALIZATION Left 07/01/2014   Procedure: LEFT BREAST LUMPECTOMY WITH RADIOACTIVE SEED LOCALIZATION;  Surgeon: Excell Seltzer, MD;  Location: [facility];  Service: General;  Laterality: Left;  . CATARACT EXTRACTION Right   . COLONOSCOPY    . LAPAROSCOPIC PARTIAL COLECTOMY N/A 11/01/2019   Procedure: LAPAROSCOPIC PARTIAL COLECTOMY;  Surgeon: Leighton Ruff, MD;  Location: [facility];  Service: General;  Laterality: N/A;  . PORTACATH PLACEMENT N/A 12/10/2019   Procedure: INSERTION PORT-A-CATH ULTRASOUND GUIDED IN RIGHT IJ;  Surgeon: Leighton Ruff, MD;  Location: [facility];  Service: General;  Laterality: N/A;  .  TOTAL KNEE ARTHROPLASTY Left 10/24/2012   Procedure: TOTAL KNEE ARTHROPLASTY;  Surgeon: Kerin Salen, MD;  Location: [facility];  Service: Orthopedics;  Laterality: Left;  . TOTAL KNEE ARTHROPLASTY Right 01/09/2013   Procedure: TOTAL KNEE ARTHROPLASTY;  Surgeon: Kerin Salen, MD;  Location: [facility];  Service: Orthopedics;  Laterality: Right;  . TUBAL LIGATION       OB History   No obstetric history on file.     Family History  Problem Relation Age of Onset  . Uterine cancer Mother        deceased 27  . Breast cancer Sister 55       currently 46  . Colon cancer Brother 41       currently 33  . Cancer Maternal Uncle        unk. primary; deceased 81s  . Thyroid cancer Daughter 41       currently 75; type?  . Colon cancer Daughter   . Breast cancer Sister     Social History   Tobacco Use  . Smoking status: Never Smoker  . Smokeless tobacco: Never Used  Vaping Use  . Vaping Use: Never used  Substance Use Topics  . Alcohol use: No  . Drug use: No    Home Medications Prior to Admission medications   Medication Sig Start Date End Date Taking? Authorizing Provider  acetaminophen (TYLENOL) 500 MG tablet Take 2 tablets (1,000 mg total) by mouth every 6 (six) hours as needed. Patient not taking: Reported on 12/03/2019 08/31/74   Leighton Ruff, MD  aspirin EC 81 MG tablet Take 81 mg by mouth daily. Swallow whole.    [provider]  Cholecalciferol (VITAMIN D) 2000 UNITS CAPS Take 2,000 Units by mouth daily.     [provider]  Ferrous Sulfate (SLOW FE PO) Take 1 tablet by mouth in the morning and at bedtime.     [provider]  furosemide (LASIX) 20 MG tablet Take 1 tablet (20 mg total) by mouth daily. 01/13/20   Truitt Merle, MD  ibuprofen (ADVIL) 200 MG tablet Take 400 mg by mouth every 6 (six) hours as needed for moderate pain.     [provider]  lidocaine-prilocaine (EMLA) cream Apply to affected area once Patient taking differently: Apply 1  application topically daily as needed (port access).  11/30/19   Truitt Merle, MD  loratadine (CLARITIN) 10 MG tablet Take 10 mg by mouth daily as needed for allergies.    [provider]  losartan (COZAAR) 25 MG tablet Take 25 mg by mouth daily.    [provider]  metFORMIN (GLUCOPHAGE) 500  MG tablet Take 1 tablet (500 mg total) by mouth daily with breakfast. Patient taking differently: Take 500 mg by mouth in the morning and at bedtime.  11/06/18   Nicholas Lose, MD  Multiple Vitamins-Minerals (MULTIVITAMIN WITH MINERALS) tablet Take 1 tablet by mouth daily.    [provider]  omeprazole (PRILOSEC) 20 MG capsule Take 20 mg by mouth daily before breakfast.     [provider]  ondansetron (ZOFRAN) 8 MG tablet Take 1 tablet (8 mg total) by mouth 2 (two) times daily as needed for refractory nausea / vomiting. Start on day 3 after chemotherapy. Patient not taking: Reported on 01/27/2020 11/30/19   Truitt Merle, MD  potassium chloride (KLOR-CON) 10 MEQ tablet Take 1 tablet (10 mEq total) by mouth daily. 01/13/20   Truitt Merle, MD  pravastatin (PRAVACHOL) 10 MG tablet Take 10 mg by mouth daily. 10/01/19   [provider]  prochlorperazine (COMPAZINE) 10 MG tablet Take 1 tablet (10 mg total) by mouth every 6 (six) hours as needed (Nausea or vomiting). Patient not taking: Reported on 01/27/2020 11/30/19   Truitt Merle, MD    Allergies    Oxycodone  Review of Systems   Review of Systems  Constitutional: Negative for chills and fever.  HENT: Negative for ear pain and sore throat.   Eyes: Negative for pain and visual disturbance.  Respiratory: Positive for cough and shortness of breath.   Cardiovascular: Positive for leg swelling. Negative for chest pain and palpitations.  Gastrointestinal: Negative for abdominal pain and vomiting.  Genitourinary: Negative for dysuria and hematuria.  Musculoskeletal: Negative for arthralgias and back pain.  Skin: Negative for color change  and rash.  Neurological: Negative for seizures and syncope.  All other systems reviewed and are negative.   Physical Exam Updated Vital Signs BP (!) 146/86 (BP Location: Left Arm)   Pulse 72   Temp 98.5 F (36.9 C) (Oral)   Resp 20   SpO2 97%   Physical Exam Vitals and nursing note reviewed.  Constitutional:      General: She is not in acute distress.    Appearance: She is well-developed. She is not ill-appearing, toxic-appearing or diaphoretic.  HENT:     Head: Normocephalic and atraumatic.  Eyes:     Conjunctiva/sclera: Conjunctivae normal.  Cardiovascular:     Rate and Rhythm: Normal rate and regular rhythm.     Heart sounds: No murmur heard.   Pulmonary:     Effort: Pulmonary effort is normal. No tachypnea, bradypnea, accessory muscle usage or respiratory distress.     Breath sounds: Normal breath sounds. No decreased breath sounds.  Chest:     Chest wall: No tenderness.  Abdominal:     Palpations: Abdomen is soft.     Tenderness: There is no abdominal tenderness.  Musculoskeletal:     Cervical back: Normal range of motion and neck supple.     Right lower leg: No tenderness. Edema present.     Left lower leg: No tenderness. Edema present.     Comments: 1-2+ pitting edema lower extremities bilaterally.  Mild erythema in a stocking distribution, at baseline per patient which has been present for years  Skin:    General: Skin is warm and dry.     Capillary Refill: Capillary refill takes less than 2 seconds.     Findings: Erythema present.  Neurological:     General: No focal deficit present.     Mental Status: She is alert.  Cranial Nerves: No cranial nerve deficit.     Motor: No weakness.  Psychiatric:        Behavior: Behavior normal.     ED Results / Procedures / Treatments   Labs (all labs ordered are listed, but only abnormal results are displayed) Labs Reviewed  BRAIN NATRIURETIC PEPTIDE - Abnormal; Notable for the following components:      Result  Value   B Natriuretic Peptide 150.7 (*)    All other components within normal limits  RESPIRATORY PANEL BY RT PCR (FLU A&B, COVID)  URINALYSIS, ROUTINE W REFLEX MICROSCOPIC  PROTIME-INR  APTT  TROPONIN I (HIGH SENSITIVITY)    EKG EKG Interpretation  Date/Time:  Monday January 27 2020 11:50:41 EDT Ventricular Rate:  69 PR Interval:    QRS Duration: 87 QT Interval:  386 QTC Calculation: 414 R Axis:   67 Text Interpretation: Sinus rhythm Abnormal R-wave progression, early transition No significant change since last tracing Confirmed by Dorie Rank [phone]) on 01/27/2020 12:14:05 PM   Radiology DG Chest 2 View  Result Date: 01/27/2020 CLINICAL DATA:  Dyspnea for 2 weeks EXAM: CHEST - 2 VIEW COMPARISON:  12/10/2019 FINDINGS: Right IJ Port-A-Cath, unchanged in positioning. The heart size and mediastinal contours are within normal limits. No focal airspace consolidation, pleural effusion, or pneumothorax. The visualized skeletal structures are unremarkable. IMPRESSION: No active cardiopulmonary disease. Electronically Signed   By: Davina Poke D.O.   On: 01/27/2020 10:51   CT Angio Chest PE W and/or Wo Contrast  Result Date: 01/27/2020 CLINICAL DATA:  Cough, shortness of breath EXAM: CT ANGIOGRAPHY CHEST WITH CONTRAST TECHNIQUE: Multidetector CT imaging of the chest was performed using the standard protocol during bolus administration of intravenous contrast. Multiplanar CT image reconstructions and MIPs were obtained to evaluate the vascular anatomy. CONTRAST:  115m OMNIPAQUE IOHEXOL 350 MG/ML SOLN COMPARISON:  10/16/2019 FINDINGS: Cardiovascular: Filling defects are noted within the pulmonary arteries in the lower lobes and right middle lobe compatible with bilateral pulmonary emboli. Slight elevated RV: LV ratio at 0.98 suggesting mild right heart strain. Heart is mildly enlarged. Aorta normal caliber. Mediastinum/Nodes: No mediastinal, hilar, or axillary adenopathy. Trachea and  esophagus are unremarkable. Left thyroid nodule measures up to 2.4 cm. Lungs/Pleura: Scarring noted in the left upper lobe/lingula peripherally possibly related to prior left breast radiation. No acute confluent opacities or effusions. Upper Abdomen: Imaging into the upper abdomen demonstrates no acute findings. Musculoskeletal: Chest wall soft tissues are unremarkable. No acute bony abnormality. Review of the MIP images confirms the above findings. IMPRESSION: Positive for acute PE with CTevidence of right heart strain (RV/LV Ratio = 0.98) consistent with at least submassive (intermediate risk) PE. The presence of right heart strain has been associated with an increased risk of morbidity and mortality. 2.4 cm left thyroid nodule. Recommend thyroid [place](ref: J Am Coll Radiol. 2015 Feb;12(2): 143-50). These results were called by telephone at the time of interpretation on 01/27/2020 at 1:18 pm [facility] who verbally acknowledged these results. Electronically Signed   By: KRolm BaptiseM.D.   On: 01/27/2020 13:21    Procedures Procedures (including critical care time) CRITICAL CARE Performed by: MPolo Riley  Total critical care time: 45 minutes  Critical care time was exclusive of separately billable procedures and treating other patients.  Critical care was necessary to treat or prevent imminent or life-threatening deterioration.  Critical care was time spent personally by me on the following activities: development of treatment plan with  patient and/or surrogate as well as nursing, discussions with consultants, evaluation of patient's response to treatment, examination of patient, obtaining history from patient or surrogate, ordering and performing treatments and interventions, ordering and review of laboratory studies, ordering and review of radiographic studies, pulse oximetry and re-evaluation of patient's condition.  Medications Ordered in ED Medications  sodium chloride (PF)  0.9 % injection (has no administration in time range)  heparin bolus via infusion 2,500 Units (has no administration in time range)  heparin ADULT infusion 100 units/mL (25000 units/252m sodium chloride 0.45%) (has no administration in time range)  iohexol (OMNIPAQUE) 350 MG/ML injection 100 mL (100 mLs Intravenous Contrast Given 01/27/20 1258)    ED Course  I have reviewed the triage vital signs and the nursing notes.  Pertinent labs & imaging results that were available during my care of the patient were reviewed by me and considered in my medical decision making (see chart for details).    MDM Rules/Calculators/A&P                         74year old female here with progressive shortness of breath over the last 2 and half weeks, sent here from cancer center to rule out PE due to an elevated D-dimer.  On arrival, she is slightly hypertensive with a blood pressure 146/86, however at rest is at 97%.  Nursing staff report that she is hypoxic with ambulation with a O2 sat of 88%.  However my exam at rest, she is at 9697%, with no evidence of respiratory distress.  Lung sounds clear with no wheezing.  Her lower extremities are edematous with some stocking glove erythema, however the patient states that this is her baseline and this is unchanged.  CBC and CMP ordered by cancer center earlier today reviewed by myself.  CBC with leukocytosis of 16.5, hemoglobin of 11.1 which appears to be more or less stable.  CMP without any significant electrolyte abnormalities, glucose of 145, she does have a mildly elevated AST of 50.  Alk phos of 131.  Most notably, her D-dimer is 5.97.  We will add on BNP, Covid test, UA, CT chest PE study pending.  EKG normal sinus rhythm.  1:20 PM: Received call from radiology notifying that her PE study was positive for acute PE with CT evidence of right heart strain consistent with submassive PE.  Patient will be started on heparin per pharmacy, PE labs ordered.  Consulted  hospitalist group for admission.  Patient remains not hypotensive, tachypneic or tachycardic, eating a sandwich as I entered the room.  Will hold critical care consult at this time.  1:40 PM: Spoke with hospitalist Dr. KMarylyn Ishiharawho will admit the patient for further evaluation and management.  Ordered an echocardiogram per his request.  Patient remains hemodynamically stable, eating and drinking in the room on reevaluation.   Final Clinical Impression(s) / ED Diagnoses Final diagnoses:  Acute pulmonary embolism, unspecified pulmonary embolism type, unspecified whether acute cor pulmonale present ([facility]    Rx / DC Orders ED Discharge Orders    None       BLyndel Safe09/27/21 1353    ALacretia Leigh MD 01/27/20 1357

## 2020-01-28 ENCOUNTER — Inpatient Hospital Stay (HOSPITAL_COMMUNITY): Payer: Medicare Other

## 2020-01-28 DIAGNOSIS — I2699 Other pulmonary embolism without acute cor pulmonale: Secondary | ICD-10-CM

## 2020-01-28 DIAGNOSIS — R609 Edema, unspecified: Secondary | ICD-10-CM

## 2020-01-28 LAB — COMPREHENSIVE METABOLIC PANEL
ALT: 32 U/L (ref 0–44)
AST: 36 U/L (ref 15–41)
Albumin: 3.3 g/dL — ABNORMAL LOW (ref 3.5–5.0)
Alkaline Phosphatase: 116 U/L (ref 38–126)
Anion gap: 9 (ref 5–15)
BUN: 15 mg/dL (ref 8–23)
CO2: 26 mmol/L (ref 22–32)
Calcium: 9.1 mg/dL (ref 8.9–10.3)
Chloride: 106 mmol/L (ref 98–111)
Creatinine, Ser: 0.82 mg/dL (ref 0.44–1.00)
GFR calc Af Amer: >60 mL/min (ref >60)
GFR calc non Af Amer: >60 mL/min (ref >60)
Glucose, Bld: 142 mg/dL — ABNORMAL HIGH (ref 70–99)
Potassium: 3.9 mmol/L (ref 3.5–5.1)
Sodium: 141 mmol/L (ref 135–145)
Total Bilirubin: 1.1 mg/dL (ref 0.3–1.2)
Total Protein: 6.1 g/dL — ABNORMAL LOW (ref 6.5–8.1)

## 2020-01-28 LAB — GLUCOSE, CAPILLARY
Glucose-Capillary: 148 mg/dL — ABNORMAL HIGH (ref 70–99)
Glucose-Capillary: 151 mg/dL — ABNORMAL HIGH (ref 70–99)
Glucose-Capillary: 151 mg/dL — ABNORMAL HIGH (ref 70–99)
Glucose-Capillary: 174 mg/dL — ABNORMAL HIGH (ref 70–99)

## 2020-01-28 LAB — CBC
HCT: 34 % — ABNORMAL LOW (ref 36.0–46.0)
Hemoglobin: 11 g/dL — ABNORMAL LOW (ref 12.0–15.0)
MCH: 31.1 pg (ref 26.0–34.0)
MCHC: 32.4 g/dL (ref 30.0–36.0)
MCV: 96 fL (ref 80.0–100.0)
Platelets: 172 10*3/uL (ref 150–400)
RBC: 3.54 MIL/uL — ABNORMAL LOW (ref 3.87–5.11)
RDW: 18.6 % — ABNORMAL HIGH (ref 11.5–15.5)
WBC: 16.5 10*3/uL — ABNORMAL HIGH (ref 4.0–10.5)
nRBC: 0.2 % (ref 0.0–0.2)

## 2020-01-28 LAB — HEPARIN LEVEL (UNFRACTIONATED)
Heparin Unfractionated: 0.96 IU/mL — ABNORMAL HIGH (ref 0.30–0.70)
Heparin Unfractionated: 0.71 IU/mL — ABNORMAL HIGH (ref 0.30–0.70)

## 2020-01-28 LAB — HEMOGLOBIN A1C
Hgb A1c MFr Bld: 7.5 % — ABNORMAL HIGH (ref 4.8–5.6)
Mean Plasma Glucose: 168.55 mg/dL

## 2020-01-28 MED ORDER — SODIUM CHLORIDE 0.9 % IV SOLN
1.0000 g | INTRAVENOUS | Status: DC
  Filled 2020-01-28: qty 10

## 2020-01-28 MED ORDER — CEPHALEXIN 500 MG PO CAPS
500.0000 mg | ORAL_CAPSULE | Freq: Four times a day (QID) | ORAL | Status: DC
  Administered 2020-01-28 – 2020-01-30 (×8): 500 mg via ORAL
  Filled 2020-01-28 (×8): qty 1

## 2020-01-28 MED ORDER — HEPARIN (PORCINE) 25000 UT/250ML-% IV SOLN
1400.0000 [IU]/h | INTRAVENOUS | Status: AC
  Administered 2020-01-28: 1400 [IU]/h via INTRAVENOUS
  Filled 2020-01-28: qty 250

## 2020-01-28 MED ORDER — POTASSIUM CHLORIDE ER 10 MEQ PO TBCR
10.0000 meq | EXTENDED_RELEASE_TABLET | Freq: Every day | ORAL | Status: DC
  Administered 2020-01-28 – 2020-01-30 (×3): 10 meq via ORAL
  Filled 2020-01-28 (×5): qty 1

## 2020-01-28 NOTE — TOC Benefit Eligibility Note (Signed)
Transition of Care [facility]) Benefit Eligibility Note    Patient Details  Name: Brieanne Mignone MRN: [id number] Date of Birth: [birth year]   Medication/Dose: Eliquis  Covered?: Yes     Prescription Coverage Preferred Pharmacy: Most major retail pharmacies  Spoke with Person/Company/Phone Number:: [employer]  Co-Pay: $37 for 30 day retail  Prior Approval: Yes [phone]  op 3 op 3)          [place] Phone Number: 01/28/2020, 9:32 AM

## 2020-01-28 NOTE — Progress Notes (Signed)
SATURATION QUALIFICATIONS: (This note is used to comply with regulatory documentation for home oxygen)  Patient Saturations on Room Air at Rest = 95%  Patient Saturations on Room Air while Ambulating = 95-86%  Patient Saturations on 0 Liters of oxygen while Ambulating = 0%  Please briefly explain why patient needs home oxygen:  Pt walked approximately 160 feet and her oxygen dropped from 95 to 86%.

## 2020-01-28 NOTE — Progress Notes (Signed)
[place] for heparin Indication: pulmonary embolus  Allergies  Allergen Reactions  . Oxycodone Nausea And Vomiting    Patient Measurements: Height: 5\' 6"  (167.6 cm) Weight: 109.4 kg (241 lb 2.9 oz) (standing scale) IBW/kg (Calculated) : 59.3 Heparin Dosing Weight: 85 kg  Vital Signs: Temp: 98.7 F (37.1 C) (09/28 1009) BP: 148/62 (09/28 1009) Pulse Rate: 75 (09/28 1009)  Labs: Recent Labs    01/27/20 0834 01/27/20 1341 01/27/20 2246 01/28/20 0512 01/28/20 0835 01/28/20 1813  HGB 11.1*  --   --  11.0*  --   --   HCT 34.0*  --   --  34.0*  --   --   PLT 173  --   --  172  --   --   APTT  --  22*  --   --   --   --   LABPROT  --  13.0  --   --   --   --   INR  --  1.0  --   --   --   --   HEPARINUNFRC  --   --  0.21*  --  0.96* 0.71*  CREATININE 0.82  --   --  0.82  --   --   TROPONINIHS  --  11  --   --   --   --     Estimated Creatinine Clearance: 78.8 mL/min (by C-G formula based on SCr of 0.82 mg/dL).  Assessment: 75 yoF with PMH breast & colon CA presenting from the cancer center with concern for PE. CTA shows acute PE with evidence of right heart strain.   Baseline INR/aPTT: WNL Anticoagulation prior to admission: none  Today, 01/28/2020:  Hgb low but stable; Pltc stable, WNL  PM heparin level = 0.71 units/mL, still slightly supratherapeutic  Confirmed w/ RN that heparin level drawn from opposite arm  No bleeding or infusion related issues per RN  Goal of Therapy:  Heparin level 0.3-0.7 units/ml Monitor platelets by anticoagulation protocol: Yes   Plan:   Decrease heparin infusion to 1400 units/hr  Check heparin level 8 hours after rate change  Daily heparin level and CBC  Monitor for signs/symptoms of bleeding  Lindell Spar, PharmD, BCPS Clinical Pharmacist  01/28/2020,7:31 PM

## 2020-01-28 NOTE — Progress Notes (Signed)
PROGRESS NOTE    Kim Castro  STM:[id number] DOB: 1946/01/03 DOA: 01/27/2020 PCP: Hulan Fess, MD    Brief Narrative: HPI per Dr. [name] Ishihara on 01/27/2020 Kim Castro is a 74 y.o. female with medical history significant of breast/colon CA. Presents with increasing dyspnea over last 5 - 6 weeks. She states that during that time she has found it increasingly difficult to catch her breath, but she didn't think much of it. She brushed it off as a side effect of her chemo. She notes that she is usually able to mow her lawn twice a week. Last week when she tried to [facility], she couldn't complete the task due to dyspnea. She reports no other aggravating or alleviating factors. She reports that she went to the doctor's office today for an infusion. When she was answering some screening questions, the office hit on her c/o dyspnea and recommended she come to the ED.    ED Course: CTA PE was obtained. She was found to be positive for bilat PE w/ right heart strain. She was started on heparin gtt. TRH was called for admission.   Assessment & Plan:   Active Problems:   PE (pulmonary thromboembolism) ([place]) #1 bilateral acute pulmonary embolism with right heart strain-she has been started on heparin Planning to switch to Xarelto in a.m. oncology following Follow-up echocardiogram-left ventricular ejection fraction 65 to 70%.  No regional wall motion abnormalities.  Grade 1 diastolic dysfunction.  Elevated left atrial pressure. Right ventricular size is mildly enlarged.  No increase in right ventricular wall thickness.  Mildly elevated pulmonary artery systolic pressure. Follow-up lower extremity ultrasound Check ambulatory pulse ox  #2 UTI patient with symptoms of dysuria and frequency with leukocytosis.  UA appears to be consistent with UTI.  Start Rocephin follow-up urine culture  #3 hyperlipidemia on statin  #4 hypertension on losartan and Lasix pressure 148/62.    #5 history of GERD on  Protonix  #6 history of breast cancer/colon cancer followed by oncology  #7 type 2 diabetes-patient is on Metformin at home which is on hold continue SSI. CBG (last 3)  Recent Labs    01/27/20 2155 01/28/20 0706 01/28/20 1129  GLUCAP 145* 148* 151*     Estimated body mass index is 38.93 kg/m as calculated from the following:   Height as of this encounter: 5\' 6"  (1.676 m).   Weight as of this encounter: 109.4 kg.  DVT prophylaxis: Heparin  code Status: Full code Family Communication: None at bedside Disposition Plan:  Status is: Inpatient  Dispo: The patient is from: Home              Anticipated d/c is to: Home              Anticipated d/c date is: 2 days              Patient currently is not medically stable to d/c.    Consultants:   Oncology  Procedures: None Antimicrobials: Rocephin started 01/28/2020 for UTI  Subjective: Patient resting in bed complains of dyspnea on exertion shortness of breath with any exertion.  Complains of urinary frequency  Objective: Vitals:   01/28/20 0108 01/28/20 0110 01/28/20 0523 01/28/20 1009  BP: (!) 163/61  (!) 157/68 (!) 148/62  Pulse: 75  74 75  Resp: 18  20 18   Temp: 98.9 F (37.2 C)  98.2 F (36.8 C) 98.7 F (37.1 C)  TempSrc: Oral  Oral   SpO2: 95%  95% 94%  Weight:  109.4 kg    Height:  5\' 6"  (1.676 m)      Intake/Output Summary (Last 24 hours) at 01/28/2020 1313 Last data filed at 01/28/2020 1000 Gross per 24 hour  Intake 331.98 ml  Output --  Net 331.98 ml   Filed Weights   01/28/20 0110  Weight: 109.4 kg    Examination:  General exam: Appears calm and comfortable  Respiratory system: Clear to auscultation. Respiratory effort normal. Cardiovascular system: S1 & S2 heard, RRR. No JVD, murmurs, rubs, gallops or clicks. No pedal edema. Gastrointestinal system: Abdomen is nondistended, soft and nontender. No organomegaly or masses felt. Normal bowel sounds heard. Central nervous system: Alert and oriented.  No focal neurological deficits. Extremities: 1+ edema bilateral lower extremity Skin: No rashes, lesions or ulcers Psychiatry: Judgement and insight appear normal. Mood & affect appropriate.     Data Reviewed: I have personally reviewed following labs and imaging studies  CBC: Recent Labs  Lab 01/27/20 0834 01/28/20 0512  WBC 16.5* 16.5*  NEUTROABS 9.6*  --   HGB 11.1* 11.0*  HCT 34.0* 34.0*  MCV 93.7 96.0  PLT 173 295   Basic Metabolic Panel: Recent Labs  Lab 01/27/20 0834 01/28/20 0512  NA 142 141  K 4.0 3.9  CL 109 106  CO2 26 26  GLUCOSE 145* 142*  BUN 15 15  CREATININE 0.82 0.82  CALCIUM 9.3 9.1   GFR: Estimated Creatinine Clearance: 78.8 mL/min (by C-G formula based on SCr of 0.82 mg/dL). Liver Function Tests: Recent Labs  Lab 01/27/20 0834 01/28/20 0512  AST 50* 36  ALT 38 32  ALKPHOS 131* 116  BILITOT 0.9 1.1  PROT 6.7 6.1*  ALBUMIN 3.3* 3.3*   No results for input(s): LIPASE, AMYLASE in the last 168 hours. No results for input(s): AMMONIA in the last 168 hours. Coagulation Profile: Recent Labs  Lab 01/27/20 1341  INR 1.0   Cardiac Enzymes: No results for input(s): CKTOTAL, CKMB, CKMBINDEX, TROPONINI in the last 168 hours. BNP (last 3 results) No results for input(s): PROBNP in the last 8760 hours. HbA1C: Recent Labs    01/27/20 2246  HGBA1C 7.5*   CBG: Recent Labs  Lab 01/27/20 1745 01/27/20 2155 01/28/20 0706 01/28/20 1129  GLUCAP 128* 145* 148* 151*   Lipid Profile: No results for input(s): CHOL, HDL, LDLCALC, TRIG, CHOLHDL, LDLDIRECT in the last 72 hours. Thyroid Function Tests: No results for input(s): TSH, T4TOTAL, FREET4, T3FREE, THYROIDAB in the last 72 hours. Anemia Panel: Recent Labs    01/27/20 0834  FERRITIN 231  TIBC 398  IRON 97   Sepsis Labs: No results for input(s): PROCALCITON, LATICACIDVEN in the last 168 hours.  Recent Results (from the past 240 hour(s))  Respiratory Panel by RT PCR (Flu A&B, Covid)  - Nasopharyngeal Swab     Status: None   Collection Time: 01/27/20 11:48 AM   Specimen: Nasopharyngeal Swab  Result Value Ref Range Status   SARS Coronavirus 2 by RT PCR NEGATIVE NEGATIVE Final    Comment: (NOTE) SARS-CoV-2 target nucleic acids are NOT DETECTED.  The SARS-CoV-2 RNA is generally detectable in upper respiratoy specimens during the acute phase of infection. The lowest concentration of SARS-CoV-2 viral copies this assay can detect is 131 copies/mL. A negative result does not preclude SARS-Cov-2 infection and should not be used as the sole basis for treatment or other patient management decisions. A negative result may occur with  improper specimen collection/handling, submission of specimen other than nasopharyngeal swab,  presence of viral mutation(s) within the areas targeted by this assay, and inadequate number of viral copies (<131 copies/mL). A negative result must be combined with clinical observations, patient history, and epidemiological information. The expected result is Negative.  Fact Sheet for Patients:  PinkCheek.be  Fact Sheet for Healthcare Providers:  GravelBags.it  This test is no t yet approved or cleared by the [place] FDA and  has been authorized for detection and/or diagnosis of SARS-CoV-2 by FDA under an Emergency Use Authorization (EUA). This EUA will remain  in effect (meaning this test can be used) for the duration of the COVID-19 declaration under Section 564(b)(1) of the Act, 21 U.S.C. section 360bbb-3(b)(1), unless the authorization is terminated or revoked sooner.     Influenza A by PCR NEGATIVE NEGATIVE Final   Influenza B by PCR NEGATIVE NEGATIVE Final    Comment: (NOTE) The Xpert Xpress SARS-CoV-2/FLU/RSV assay is intended as an aid in  the diagnosis of influenza from Nasopharyngeal swab specimens and  should not be used as a sole basis for treatment. Nasal washings and   aspirates are unacceptable for Xpert Xpress SARS-CoV-2/FLU/RSV  testing.  Fact Sheet for Patients: PinkCheek.be  Fact Sheet for Healthcare Providers: GravelBags.it  This test is not yet approved or cleared by the [place] FDA and  has been authorized for detection and/or diagnosis of SARS-CoV-2 by  FDA under an Emergency Use Authorization (EUA). This EUA will remain  in effect (meaning this test can be used) for the duration of the  Covid-19 declaration under Section 564(b)(1) of the Act, 21  U.S.C. section 360bbb-3(b)(1), unless the authorization is  terminated or revoked. Performed at [facility], [place] [address] [place], [place] [address]          Radiology Studies: DG Chest 2 View  Result Date: 01/27/2020 CLINICAL DATA:  Dyspnea for 2 weeks EXAM: CHEST - 2 VIEW COMPARISON:  12/10/2019 FINDINGS: Right IJ Port-A-Cath, unchanged in positioning. The heart size and mediastinal contours are within normal limits. No focal airspace consolidation, pleural effusion, or pneumothorax. The visualized skeletal structures are unremarkable. IMPRESSION: No active cardiopulmonary disease. Electronically Signed   By: Davina Poke D.O.   On: 01/27/2020 10:51   CT Angio Chest PE W and/or Wo Contrast  Result Date: 01/27/2020 CLINICAL DATA:  Cough, shortness of breath EXAM: CT ANGIOGRAPHY CHEST WITH CONTRAST TECHNIQUE: Multidetector CT imaging of the chest was performed using the standard protocol during bolus administration of intravenous contrast. Multiplanar CT image reconstructions and MIPs were obtained to evaluate the vascular anatomy. CONTRAST:  163mL OMNIPAQUE IOHEXOL 350 MG/ML SOLN COMPARISON:  10/16/2019 FINDINGS: Cardiovascular: Filling defects are noted within the pulmonary arteries in the lower lobes and right middle lobe compatible with bilateral pulmonary emboli. Slight elevated RV: LV ratio at 0.98  suggesting mild right heart strain. Heart is mildly enlarged. Aorta normal caliber. Mediastinum/Nodes: No mediastinal, hilar, or axillary adenopathy. Trachea and esophagus are unremarkable. Left thyroid nodule measures up to 2.4 cm. Lungs/Pleura: Scarring noted in the left upper lobe/lingula peripherally possibly related to prior left breast radiation. No acute confluent opacities or effusions. Upper Abdomen: Imaging into the upper abdomen demonstrates no acute findings. Musculoskeletal: Chest wall soft tissues are unremarkable. No acute bony abnormality. Review of the MIP images confirms the above findings. IMPRESSION: Positive for acute PE with CTevidence of right heart strain (RV/LV Ratio = 0.98) consistent with at least submassive (intermediate risk) PE. The presence of right heart strain has been associated with an  increased risk of morbidity and mortality. 2.4 cm left thyroid nodule. Recommend thyroid US (ref: J Am Coll Radiol. 2015 Feb;12(2): 143-50). These results were called by telephone at the time of interpretation on 01/27/2020 at 1:18 pm to provider [facility] , who verbally acknowledged these results. Electronically Signed   By: Rolm Baptise M.D.   On: 01/27/2020 13:21   ECHOCARDIOGRAM COMPLETE  Result Date: 01/27/2020    ECHOCARDIOGRAM REPORT   Patient Name:   NATTALIE SANTIESTEBAN [facility] Date of Exam: 01/27/2020 Medical Rec #:  [id number]          Height:       66.0 in Accession #:    [id number]         Weight:       242.6 lb Date of Birth:  [birth year]          BSA:          2.171 m Patient Age:    10 years           BP:           150/98 mmHg Patient Gender: F                  HR:           82 bpm. Exam Location:  Inpatient Procedure: 2D Echo Indications:    Pulmonary Embolus I26.99  History:        Patient has no prior history of Echocardiogram examinations.                 Risk Factors:Hypertension and Diabetes.  Sonographer:    Mikki Santee RDCS (AE) Referring Phys: [id number] MARIA A [place]  1. Left ventricular ejection fraction, by estimation, is 65 to 70%. The left ventricle has normal function. The left ventricle has no regional wall motion abnormalities. Left ventricular diastolic parameters are consistent with Grade I diastolic dysfunction (impaired relaxation). Elevated left atrial pressure.  2. Right ventricular systolic function is normal. The right ventricular size is mildly enlarged. There is mildly elevated pulmonary artery systolic pressure. The estimated right ventricular systolic pressure is 24.2 mmHg.  3. Left atrial size was mildly dilated.  4. The mitral valve is normal in structure. Trivial mitral valve regurgitation. No evidence of mitral stenosis.  5. The aortic valve is normal in structure. There is moderate calcification of the aortic valve. There is moderate thickening of the aortic valve. Aortic valve regurgitation is not visualized. No aortic stenosis is present.  6. The inferior vena cava is normal in size with greater than 50% respiratory variability, suggesting right atrial pressure of 3 mmHg. FINDINGS  Left Ventricle: Left ventricular ejection fraction, by estimation, is 65 to 70%. The left ventricle has normal function. The left ventricle has no regional wall motion abnormalities. The left ventricular internal cavity size was normal in size. There is  no left ventricular hypertrophy. Left ventricular diastolic parameters are consistent with Grade I diastolic dysfunction (impaired relaxation). Elevated left atrial pressure. Right Ventricle: The right ventricular size is mildly enlarged. No increase in right ventricular wall thickness. Right ventricular systolic function is normal. There is mildly elevated pulmonary artery systolic pressure. The tricuspid regurgitant velocity is 2.57 m/s, and with an assumed right atrial pressure of 8 mmHg, the estimated right ventricular systolic pressure is 68.3 mmHg. Left Atrium: Left atrial size was mildly dilated. Right  Atrium: Right atrial size was normal in size. Pericardium: There is no evidence of pericardial effusion. Mitral Valve: The  mitral valve is normal in structure. There is mild thickening of the mitral valve leaflet(s). There is mild calcification of the mitral valve leaflet(s). Trivial mitral valve regurgitation. No evidence of mitral valve stenosis. Tricuspid Valve: The tricuspid valve is normal in structure. Tricuspid valve regurgitation is mild . No evidence of tricuspid stenosis. Aortic Valve: The aortic valve is normal in structure. There is moderate calcification of the aortic valve. There is moderate thickening of the aortic valve. Aortic valve regurgitation is not visualized. No aortic stenosis is present. Pulmonic Valve: The pulmonic valve was normal in structure. Pulmonic valve regurgitation is not visualized. No evidence of pulmonic stenosis. Aorta: The aortic root is normal in size and structure. Venous: The inferior vena cava is normal in size with greater than 50% respiratory variability, suggesting right atrial pressure of 3 mmHg. IAS/Shunts: No atrial level shunt detected by color flow Doppler.  LEFT VENTRICLE PLAX 2D LVIDd:         4.90 cm  Diastology LVIDs:         3.00 cm  LV e' medial:    5.87 cm/s LV PW:         1.00 cm  LV E/e' medial:  14.7 LV IVS:        1.00 cm  LV e' lateral:   8.92 cm/s LVOT diam:     2.30 cm  LV E/e' lateral: 9.7 LV SV:         90 LV SV Index:   42 LVOT Area:     4.15 cm  RIGHT VENTRICLE RV S prime:     12.60 cm/s TAPSE (M-mode): 2.3 cm LEFT ATRIUM             Index       RIGHT ATRIUM           Index LA diam:        4.00 cm 1.84 cm/m  RA Area:     11.90 cm LA Vol (A2C):   83.1 ml 38.28 ml/m RA Volume:   24.40 ml  11.24 ml/m LA Vol (A4C):   53.0 ml 24.41 ml/m LA Biplane Vol: 65.7 ml 30.26 ml/m  AORTIC VALVE LVOT Vmax:   83.00 cm/s LVOT Vmean:  58.800 cm/s LVOT VTI:    0.217 m  AORTA Ao Root diam: 3.10 cm MITRAL VALVE                TRICUSPID VALVE MV Area (PHT): 2.24  cm     TR Peak grad:   26.4 mmHg MV Decel Time: 338 msec     TR Vmax:        257.00 cm/s MV E velocity: 86.30 cm/s MV A velocity: 113.00 cm/s  SHUNTS MV E/A ratio:  0.76         Systemic VTI:  0.22 m                             Systemic Diam: 2.30 cm Ena Dawley MD Electronically signed by Ena Dawley MD Signature Date/Time: 01/27/2020/3:56:42 PM    Final         Scheduled Meds: . aspirin EC  81 mg Oral Daily  . furosemide  20 mg Oral Daily  . insulin aspart  0-5 Units Subcutaneous QHS  . insulin aspart  0-9 Units Subcutaneous TID WC  . losartan  25 mg Oral Daily  . pravastatin  10 mg Oral Daily   Continuous Infusions: .  heparin 1,500 Units/hr (01/28/20 1035)     LOS: 1 day     Georgette Shell, MD 01/28/2020, 1:13 PM

## 2020-01-28 NOTE — Progress Notes (Signed)
Bilateral lower extremity venous duplex completed. Refer to "CV Proc" under chart review to view preliminary results.  01/28/2020 4:26 PM Kelby Aline., MHA, RVT, RDCS, RDMS

## 2020-01-28 NOTE — Progress Notes (Signed)
[place] for heparin Indication: pulmonary embolus  Allergies  Allergen Reactions  . Oxycodone Nausea And Vomiting    Patient Measurements: Height: 5\' 6"  (167.6 cm) Weight: 109.4 kg (241 lb 2.9 oz) (standing scale) IBW/kg (Calculated) : 59.3 Heparin Dosing Weight: 85 kg  Vital Signs: Temp: 98.7 F (37.1 C) (09/28 1009) Temp Source: Oral (09/28 0523) BP: 148/62 (09/28 1009) Pulse Rate: 75 (09/28 1009)  Labs: Recent Labs    01/27/20 0834 01/27/20 1341 01/27/20 2246 01/28/20 0512 01/28/20 0835  HGB 11.1*  --   --  11.0*  --   HCT 34.0*  --   --  34.0*  --   PLT 173  --   --  172  --   APTT  --  22*  --   --   --   LABPROT  --  13.0  --   --   --   INR  --  1.0  --   --   --   HEPARINUNFRC  --   --  0.21*  --  0.96*  CREATININE 0.82  --   --  0.82  --   TROPONINIHS  --  11  --   --   --     Estimated Creatinine Clearance: 78.8 mL/min (by C-G formula based on SCr of 0.82 mg/dL).  Assessment: 67 yoF with PMH breast & colon CA presenting from the cancer center with concern for PE. CTA shows acute PE with evidence of right heart strain  Baseline INR/aPTT: WNL Anticoagulation prior to admission: none  Today, 01/28/2020:  Hgb low but stable; Plt stable WNL  Heparin level now SUPRAtherapeutic on 1700 units/hr; confirmed w/ RN level drawn from opposite arm  No bleeding or infusion related issues per RN  Goal of Therapy:  Heparin level 0.3-0.7 units/ml Monitor platelets by anticoagulation protocol: Yes   Plan:   Decrease heparin infusion to 1500 units/hr  Check HL in 8 hours  Daily heparin level and CBC  Monitor for signs/symptoms of bleeding  F/u for transition to chronic anticoag  Roddrick Sharron A, PharmD 01/28/2020,10:10 AM

## 2020-01-28 NOTE — Progress Notes (Addendum)
HEMATOLOGY-ONCOLOGY PROGRESS NOTE  SUBJECTIVE: CTA chest positive for PE with right heart strain.  She is currently on a heparin drip.  Reports shortness of breath only with increased exertion.  She notices more nonproductive cough today.  She has persistent lower extremity edema which is not new.  Oncology History Overview Note  Cancer Staging Breast cancer of upper-outer quadrant of left female breast [facility]) Staging form: Breast, AJCC 7th Edition - Clinical stage from 06/11/2014: Stage 0 (Tis (DCIS), N0, M0) - Unsigned - Pathologic stage from 07/03/2014: Stage Unknown (Tis (DCIS), NX, cM0) - Signed by Enid Cutter, MD on 07/10/2014 Staging comments: Staged on final lumpectomy specimen by Dr. Donato Heinz.  right colon cancer Staging form: Colon and Rectum, AJCC 8th Edition - Pathologic stage from 11/01/2019: Stage IIIB (pT3, pN2a, cM0) - Signed by Truitt Merle, MD on 11/29/2019    Breast cancer of upper-outer quadrant of left female breast ([place])  05/29/2014 Initial Biopsy   Left breast needle core biopsy: Grade 2, DCIS with calcs. ER+ (100%), PR+ (96%).    06/04/2014 Initial Diagnosis   Left breast DCIS with calcifications, ER 100%, PR 96%   06/10/2014 Breast MRI   Left breast: 2.4 x 1.3 x 1.1 cm area of patchy non-mass enhancement upper outer quadrant includes postbiopsy seroma; Right breast: 1.2 cm previously biopsied stable benign fibroadenoma   06/12/2014 Procedure   Genetic counseling/testing: Identified 1 VUS on CHEK2 gene. Remainder of 17 gene panel tested negative and included: ATM, BARD1, BRCA 1/2, BRIP1, CDH1, CHEK2, EPCAM, MLH1, MSH2, MSH6, NBN, NF1, PALB2, PTEN, RAD50, RAD51C, RAD51D, STK11, and TP53.    07/01/2014 Surgery   Left breast lumpectomy ([place]): Grade 1, DCIS, spanning 2.3 cm, 1 mm margin, ER 100%, PR 96%   07/31/2014 - 08/28/2014 Radiation Therapy   Adjuvant RT completed [facility]). Left breast: Total dose 42.5 Gy over 17 fractions. Left breast boost: Total dose 7.5 Gy over 3  fractions.    09/14/2014 - 01/13/2020 Anti-estrogen oral therapy   Anastrazole 63m daily. Planned duration of treatment: 5 years ([place]. Completed in 01/2020.    09/25/2014 Survivorship   Survivorship Care Plan given to patient and reviewed with her in person.    right colon cancer  09/19/2019 Imaging   CT AP W contrast 09/19/19  IMPRESSION Fullness in the cecum, cannot exclude a mass. No evidence for metastatic disease is identified.    09/24/2019 Procedure   Colonoscopy by Dr MEber Jones5/25/21 IMPRESSION 1. The colon was redundant  2. Mild diverticulosis was noted through the entire examined colon 3. Single 166mpolyp was found in the ascending colon; polypectomy was performed using snare cautery and biopsy forceps 4. Mild diverticulosis was notes in the descending colon and sigmoid colon.  5. Single polyp was found in the sigmoid colon, polypectomy was performed with cold forceps.  6. Single polyp was found in the rectosigmoid colon; polypectomy was performed with cold snare  7. Small internal hemorrhoids  8. Large mass was found at the cecum; multiple biopsies of the area were performed using cold forceps; injection (tattooing) was performed distal to the mass.    09/24/2019 Initial Biopsy   INTERPRETATION AND DIAGNOSIS:  A. Cecum, biopsy:  Invasive moderately differentiated adenocarcinoma.  see comment  B. Polyp @ ascending colon, polypectomy:  Tubular Adenoma  C. Polyp @ sigmoid colon Polypectomy:  hyperplastic polyp.  D. Polyp @ rectosigmoid colon, Polypectomy:  Hyperplastic Polyp      10/16/2019 Imaging   CT Chest IMPRESSION: 1. Multiple small pulmonary  nodules measuring 5 mm or less in size in the lungs. These are nonspecific and are typically considered statistically likely benign. However, given the patient's history of primary malignancy, close attention on follow-up studies is recommended to ensure stability. 2. Aortic atherosclerosis, in addition to right coronary  artery disease. Assessment for potential risk factor modification, dietary therapy or pharmacologic therapy may be warranted, if clinically indicated. 3. There are calcifications of the aortic valve and mitral annulus. Echocardiographic correlation for evaluation of potential valvular dysfunction may be warranted if clinically indicated. 4. Small hiatal hernia.   Aortic Atherosclerosis (ICD10-I70.0).   11/01/2019 Initial Diagnosis   Colon cancer ([place])   11/01/2019 Surgery   LAPAROSCOPIC PARTIAL COLECTOMY by Dr Marcello Moores and Dr Johney Maine   11/01/2019 Pathology Results   FINAL MICROSCOPIC DIAGNOSIS:   A. COLON, PROXIMAL RIGHT, COLECTOMY:  - Invasive colonic adenocarcinoma, 5 cm.  - Tumor invades through the muscularis propria into pericolonic tissues.   - Margins of resection are not involved.  - Metastatic carcinoma in (5) of (13) lymph nodes.  - See oncology table.    MSI Stable  Mismatch repair normal  MLH1 - Preserved nuclear expression (greater 50% tumor expression) MSH2 - Preserved nuclear expression (greater 50% tumor expression) MSH6 - Preserved nuclear expression (greater 50% tumor expression) PMS2 - Preserved nuclear expression (greater 50% tumor expression)   11/01/2019 Cancer Staging   Staging form: Colon and Rectum, AJCC 8th Edition - Pathologic stage from 11/01/2019: Stage IIIB (pT3, pN2a, cM0) - Signed by Truitt Merle, MD on 11/29/2019   12/10/2019 Procedure   PAC placed 12/10/19   12/17/2019 -  Chemotherapy   FOLFOX q2weeks starting in 2 weeks starting 12/17/19      REVIEW OF SYSTEMS:   Constitutional: Denies fevers, chills or abnormal weight loss Eyes: Denies blurriness of vision Ears, nose, mouth, throat, and face: Denies mucositis or sore throat Respiratory: Ports cough, shortness of breath only with exertion Cardiovascular: Denies palpitation, chest discomfort Gastrointestinal:  Denies nausea, heartburn or change in bowel habits Skin: Denies abnormal skin  rashes Lymphatics: Denies new lymphadenopathy or easy bruising Neurological:Denies numbness, tingling or new weaknesses Behavioral/Psych: Mood is stable, no new changes  Extremities: Has persistent bilateral lower extremity edema All other systems were reviewed with the patient and are negative.  I have reviewed the past medical history, past surgical history, social history and family history with the patient and they are unchanged from previous note.   PHYSICAL EXAMINATION: ECOG PERFORMANCE STATUS: 1 - Symptomatic but completely ambulatory  Vitals:   01/28/20 0523 01/28/20 1009  BP: (!) 157/68 (!) 148/62  Pulse: 74 75  Resp: 20 18  Temp: 98.2 F (36.8 C) 98.7 F (37.1 C)  SpO2: 95% 94%   Filed Weights   01/28/20 0110  Weight: 109.4 kg    Intake/Output from previous day: 09/27 0701 - 09/28 0700 In: 131 [I.V.:131] Out: -   GENERAL:alert, no distress and comfortable SKIN: skin color, texture, turgor are normal, no rashes or significant lesions EYES: normal, Conjunctiva are pink and non-injected, sclera clear OROPHARYNX:no exudate, no erythema and lips, buccal mucosa, and tongue normal  NECK: supple, thyroid normal size, non-tender, without nodularity LYMPH:  no palpable lymphadenopathy in the cervical, axillary or inguinal LUNGS: clear to auscultation and percussion with normal breathing effort HEART: regular rate & rhythm and no murmurs and trace lower extremity edema bilaterally ABDOMEN:abdomen soft, non-tender and normal bowel sounds Musculoskeletal:no cyanosis of digits and no clubbing  NEURO: alert & oriented x 3  with fluent speech, no focal motor/sensory deficits  LABORATORY DATA:  I have reviewed the data as listed CMP Latest Ref Rng & Units 01/28/2020 01/27/2020 01/13/2020  Glucose 70 - 99 mg/dL 142(H) 145(H) 161(H)  BUN 8 - 23 mg/dL _0 Creatinine 0.44 - 1.00 mg/dL 0.82 0.82 0.79  Sodium 135 - 145 mmol/L 141 142 142  Potassium 3.5 - 5.1 mmol/L 3.9 4.0  3.1(L)  Chloride 98 - 111 mmol/L 106 109 110  CO2 22 - 32 mmol/L _1 Calcium 8.9 - 10.3 mg/dL 9.1 9.3 9.0  Total Protein 6.5 - 8.1 g/dL 6.1(L) 6.7 6.7  Total Bilirubin 0.3 - 1.2 mg/dL 1.1 0.9 1.3(H)  Alkaline Phos 38 - 126 U/L 116 131(H) 151(H)  AST 15 - 41 U/L 36 50(H) 105(H)  ALT 0 - 44 U/L 32 38 90(H)    Lab Results  Component Value Date   WBC 16.5 (H) 01/28/2020   HGB 11.0 (L) 01/28/2020   HCT 34.0 (L) 01/28/2020   MCV 96.0 01/28/2020   PLT 172 01/28/2020   NEUTROABS 9.6 (H) 01/27/2020    DG Chest 2 View  Result Date: 01/27/2020 CLINICAL DATA:  Dyspnea for 2 weeks EXAM: CHEST - 2 VIEW COMPARISON:  12/10/2019 FINDINGS: Right IJ Port-A-Cath, unchanged in positioning. The heart size and mediastinal contours are within normal limits. No focal airspace consolidation, pleural effusion, or pneumothorax. The visualized skeletal structures are unremarkable. IMPRESSION: No active cardiopulmonary disease. Electronically Signed   By: Davina Poke D.O.   On: 01/27/2020 10:51   CT Angio Chest PE W and/or Wo Contrast  Result Date: 01/27/2020 CLINICAL DATA:  Cough, shortness of breath EXAM: CT ANGIOGRAPHY CHEST WITH CONTRAST TECHNIQUE: Multidetector CT imaging of the chest was performed using the standard protocol during bolus administration of intravenous contrast. Multiplanar CT image reconstructions and MIPs were obtained to evaluate the vascular anatomy. CONTRAST:  115m OMNIPAQUE IOHEXOL 350 MG/ML SOLN COMPARISON:  10/16/2019 FINDINGS: Cardiovascular: Filling defects are noted within the pulmonary arteries in the lower lobes and right middle lobe compatible with bilateral pulmonary emboli. Slight elevated RV: LV ratio at 0.98 suggesting mild right heart strain. Heart is mildly enlarged. Aorta normal caliber. Mediastinum/Nodes: No mediastinal, hilar, or axillary adenopathy. Trachea and esophagus are unremarkable. Left thyroid nodule measures up to 2.4 cm. Lungs/Pleura: Scarring noted in  the left upper lobe/lingula peripherally possibly related to prior left breast radiation. No acute confluent opacities or effusions. Upper Abdomen: Imaging into the upper abdomen demonstrates no acute findings. Musculoskeletal: Chest wall soft tissues are unremarkable. No acute bony abnormality. Review of the MIP images confirms the above findings. IMPRESSION: Positive for acute PE with CTevidence of right heart strain (RV/LV Ratio = 0.98) consistent with at least submassive (intermediate risk) PE. The presence of right heart strain has been associated with an increased risk of morbidity and mortality. 2.4 cm left thyroid nodule. Recommend thyroid [place](ref: J Am Coll Radiol. 2015 Feb;12(2): 143-50). These results were called by telephone at the time of interpretation on 01/27/2020 at 1:18 pm [facility], who verbally acknowledged these results. Electronically Signed   By: KRolm BaptiseM.D.   On: 01/27/2020 13:21   ECHOCARDIOGRAM COMPLETE  Result Date: 01/27/2020    ECHOCARDIOGRAM REPORT   [facility]   [facility] Medical Rec #:  [id number]         Height:       66.0 in Accession #:  [id number]         Weight:       242.6 lb Date of Birth:  [birth year]          BSA:          2.171 m Patient Age:    23 years           BP:           150/98 mmHg Patient Gender: F                  HR:           82 bpm. Exam Location:  Inpatient Procedure: 2D Echo Indications:    Pulmonary Embolus I26.99  History:        Patient has no prior history of Echocardiogram examinations.                 Risk Factors:Hypertension and Diabetes.  Sonographer:    Mikki Santee RDCS (AE) Referring Phys: [id number] MARIA A Cedar  1. Left ventricular ejection fraction, by estimation, is 65 to 70%. The left ventricle has normal function. The left ventricle has no regional wall motion abnormalities. Left ventricular diastolic parameters are consistent with Grade I diastolic dysfunction  (impaired relaxation). Elevated left atrial pressure.  2. Right ventricular systolic function is normal. The right ventricular size is mildly enlarged. There is mildly elevated pulmonary artery systolic pressure. The estimated right ventricular systolic pressure is 08.6 mmHg.  3. Left atrial size was mildly dilated.  4. The mitral valve is normal in structure. Trivial mitral valve regurgitation. No evidence of mitral stenosis.  5. The aortic valve is normal in structure. There is moderate calcification of the aortic valve. There is moderate thickening of the aortic valve. Aortic valve regurgitation is not visualized. No aortic stenosis is present.  6. The inferior vena cava is normal in size with greater than 50% respiratory variability, suggesting right atrial pressure of 3 mmHg. FINDINGS  Left Ventricle: Left ventricular ejection fraction, by estimation, is 65 to 70%. The left ventricle has normal function. The left ventricle has no regional wall motion abnormalities. The left ventricular internal cavity size was normal in size. There is  no left ventricular hypertrophy. Left ventricular diastolic parameters are consistent with Grade I diastolic dysfunction (impaired relaxation). Elevated left atrial pressure. Right Ventricle: The right ventricular size is mildly enlarged. No increase in right ventricular wall thickness. Right ventricular systolic function is normal. There is mildly elevated pulmonary artery systolic pressure. The tricuspid regurgitant velocity is 2.57 m/s, and with an assumed right atrial pressure of 8 mmHg, the estimated right ventricular systolic pressure is 76.1 mmHg. Left Atrium: Left atrial size was mildly dilated. Right Atrium: Right atrial size was normal in size. Pericardium: There is no evidence of pericardial effusion. Mitral Valve: The mitral valve is normal in structure. There is mild thickening of the mitral valve leaflet(s). There is mild calcification of the mitral valve  leaflet(s). Trivial mitral valve regurgitation. No evidence of mitral valve stenosis. Tricuspid Valve: The tricuspid valve is normal in structure. Tricuspid valve regurgitation is mild . No evidence of tricuspid stenosis. Aortic Valve: The aortic valve is normal in structure. There is moderate calcification of the aortic valve. There is moderate thickening of the aortic valve. Aortic valve regurgitation is not visualized. No aortic stenosis is present. Pulmonic Valve: The pulmonic valve was normal in structure. Pulmonic valve regurgitation is not visualized. No evidence of pulmonic stenosis. Aorta: The aortic root is normal  in size and structure. Venous: The inferior vena cava is normal in size with greater than 50% respiratory variability, suggesting right atrial pressure of 3 mmHg. IAS/Shunts: No atrial level shunt detected by color flow Doppler.  LEFT VENTRICLE PLAX 2D LVIDd:         4.90 cm  Diastology LVIDs:         3.00 cm  LV e' medial:    5.87 cm/s LV PW:         1.00 cm  LV E/e' medial:  14.7 LV IVS:        1.00 cm  LV e' lateral:   8.92 cm/s LVOT diam:     2.30 cm  LV E/e' lateral: 9.7 LV SV:         90 LV SV Index:   42 LVOT Area:     4.15 cm  RIGHT VENTRICLE RV S prime:     12.60 cm/s TAPSE (M-mode): 2.3 cm LEFT ATRIUM             Index       RIGHT ATRIUM           Index LA diam:        4.00 cm 1.84 cm/m  RA Area:     11.90 cm LA Vol (A2C):   83.1 ml 38.28 ml/m RA Volume:   24.40 ml  11.24 ml/m LA Vol (A4C):   53.0 ml 24.41 ml/m LA Biplane Vol: 65.7 ml 30.26 ml/m  AORTIC VALVE LVOT Vmax:   83.00 cm/s LVOT Vmean:  58.800 cm/s LVOT VTI:    0.217 m  AORTA Ao Root diam: 3.10 cm MITRAL VALVE                TRICUSPID VALVE MV Area (PHT): 2.24 cm     TR Peak grad:   26.4 mmHg MV Decel Time: 338 msec     TR Vmax:        257.00 cm/s MV E velocity: 86.30 cm/s MV A velocity: 113.00 cm/s  SHUNTS MV E/A ratio:  0.76         Systemic VTI:  0.22 m                             Systemic Diam: 2.30 cm Ena Dawley MD Electronically signed by Ena Dawley MD Signature Date/Time: 01/27/2020/3:56:42 PM    Final     ASSESSMENT AND PLAN: 1.  Submassive bilateral pulmonary embolism with right heart strain 2.  Right colon cancer, pT3 N2 aM0 stage IIb, MSS 3.  Iron deficiency anemia 4.  History of left breast DCIS 5.  Hypertension 6.  Diabetes mellitus 7.  Arthritis 8.  GERD  -CTA showed PE with right heart strain.  She also has bilateral lower extremity edema.  Await echocardiogram.  Recommend bilateral Doppler ultrasound of the lower extremities.  Continue heparin drip and we can transition her to DOAC versus Lovenox prior to discharge.  Further recommendations per Dr. Burr Medico. -Chemotherapy has been placed on hold and we will reschedule this as an outpatient. -She has mild anemia secondary to iron deficiency as well as recent chemotherapy.  Hemoglobin overall stable.  Monitor for now. -Continue management of chronic medical conditions per hospitalist.   LOS: 1 day   Mikey Bussing, DNP, AGPCNP-BC, AOCNP 01/28/20  Addendum  Pt was admitted yesterday for submassive PE.  She is clinically stable, may need evaluate her need for oxygen before  discharge in the next few days.  I discussed the various options of anticoagulation, especially Lovenox injection, Coumadin and Xarelto.  Patient is not comfortable to do self injections, I think is reasonable to discharge on Xarelto loading dose. I will hold her chemo for 2 weeks. All questions answered.  Truitt Merle  01/28/2020

## 2020-01-29 ENCOUNTER — Inpatient Hospital Stay: Payer: Medicare Other

## 2020-01-29 DIAGNOSIS — C189 Malignant neoplasm of colon, unspecified: Secondary | ICD-10-CM

## 2020-01-29 DIAGNOSIS — N390 Urinary tract infection, site not specified: Secondary | ICD-10-CM | POA: Diagnosis present

## 2020-01-29 DIAGNOSIS — E785 Hyperlipidemia, unspecified: Secondary | ICD-10-CM

## 2020-01-29 DIAGNOSIS — I10 Essential (primary) hypertension: Secondary | ICD-10-CM

## 2020-01-29 DIAGNOSIS — K219 Gastro-esophageal reflux disease without esophagitis: Secondary | ICD-10-CM

## 2020-01-29 DIAGNOSIS — E1169 Type 2 diabetes mellitus with other specified complication: Secondary | ICD-10-CM

## 2020-01-29 LAB — HEPARIN LEVEL (UNFRACTIONATED)
Heparin Unfractionated: 0.54 IU/mL (ref 0.30–0.70)
Heparin Unfractionated: 0.51 IU/mL (ref 0.30–0.70)

## 2020-01-29 LAB — CBC
HCT: 35.4 % — ABNORMAL LOW (ref 36.0–46.0)
Hemoglobin: 11.2 g/dL — ABNORMAL LOW (ref 12.0–15.0)
MCH: 30.6 pg (ref 26.0–34.0)
MCHC: 31.6 g/dL (ref 30.0–36.0)
MCV: 96.7 fL (ref 80.0–100.0)
Platelets: 180 10*3/uL (ref 150–400)
RBC: 3.66 MIL/uL — ABNORMAL LOW (ref 3.87–5.11)
RDW: 18.7 % — ABNORMAL HIGH (ref 11.5–15.5)
WBC: 13.4 10*3/uL — ABNORMAL HIGH (ref 4.0–10.5)
nRBC: 0.1 % (ref 0.0–0.2)

## 2020-01-29 LAB — BASIC METABOLIC PANEL
Anion gap: 11 (ref 5–15)
BUN: 13 mg/dL (ref 8–23)
CO2: 27 mmol/L (ref 22–32)
Calcium: 9 mg/dL (ref 8.9–10.3)
Chloride: 104 mmol/L (ref 98–111)
Creatinine, Ser: 0.82 mg/dL (ref 0.44–1.00)
GFR calc Af Amer: >60 mL/min (ref >60)
GFR calc non Af Amer: >60 mL/min (ref >60)
Glucose, Bld: 144 mg/dL — ABNORMAL HIGH (ref 70–99)
Potassium: 4 mmol/L (ref 3.5–5.1)
Sodium: 142 mmol/L (ref 135–145)

## 2020-01-29 LAB — GLUCOSE, CAPILLARY
Glucose-Capillary: 157 mg/dL — ABNORMAL HIGH (ref 70–99)
Glucose-Capillary: 140 mg/dL — ABNORMAL HIGH (ref 70–99)
Glucose-Capillary: 179 mg/dL — ABNORMAL HIGH (ref 70–99)
Glucose-Capillary: 166 mg/dL — ABNORMAL HIGH (ref 70–99)

## 2020-01-29 MED ORDER — APIXABAN 5 MG PO TABS: 5.0000 mg | ORAL_TABLET | Freq: Two times a day (BID) | ORAL | Status: DC

## 2020-01-29 MED ORDER — ACETAMINOPHEN 325 MG PO TABS
650.0000 mg | ORAL_TABLET | Freq: Once | ORAL | Status: AC
  Administered 2020-01-29: 650 mg via ORAL
  Filled 2020-01-29: qty 2

## 2020-01-29 MED ORDER — APIXABAN 5 MG PO TABS
10.0000 mg | ORAL_TABLET | Freq: Two times a day (BID) | ORAL | Status: DC
  Administered 2020-01-29 – 2020-01-30 (×2): 10 mg via ORAL
  Filled 2020-01-29 (×2): qty 2

## 2020-01-29 NOTE — Evaluation (Signed)
Physical Therapy Evaluation-1x Patient Details Name: Ryley Teater MRN: [id number] DOB: November 08, 1945 Today's Date: 01/29/2020   History of Present Illness  74 yo female admitted with acute PE with R heart strain, UTI. Hx of breast ca, colon ca, DM  Clinical Impression  On eval, pt was Mod Ind with mobility. She walked ~125 feet in the hallway. O2 93% on RA at end of session. No PT needs. 1x eval. Will sign off.     Follow Up Recommendations No PT follow up    Equipment Recommendations  None recommended by PT    Recommendations for Other Services       Precautions / Restrictions Precautions Precautions: None Restrictions Weight Bearing Restrictions: No      Mobility  Bed Mobility Overal bed mobility: Modified Independent                Transfers Overall transfer level: Modified independent                  Ambulation/Gait Ambulation/Gait assistance: Modified independent (Device/Increase time) Gait Distance (Feet): 125 Feet Assistive device: IV Pole          Stairs            Wheelchair Mobility    Modified Rankin (Stroke Patients Only)       Balance Overall balance assessment: No apparent balance deficits (not formally assessed)                                           Pertinent Vitals/Pain Pain Assessment: No/denies pain    Home Living Family/patient expects to be discharged to:: Private residence Living Arrangements: Spouse/significant other   Type of Home: House Home Access: Stairs to enter     Home Layout: Able to live on main level with bedroom/bathroom;Two level Home Equipment: Walker - 2 wheels;Cane - single point;Bedside commode;Shower seat      Prior Function Level of Independence: Independent               Hand Dominance        Extremity/Trunk Assessment   Upper Extremity Assessment Upper Extremity Assessment: Overall WFL for tasks assessed    Lower Extremity Assessment Lower  Extremity Assessment: Overall WFL for tasks assessed    Cervical / Trunk Assessment Cervical / Trunk Assessment: Normal  Communication   Communication: No difficulties  Cognition Arousal/Alertness: Awake/alert Behavior During Therapy: WFL for tasks assessed/performed Overall Cognitive Status: Within Functional Limits for tasks assessed                                        General Comments      Exercises     Assessment/Plan    PT Assessment Patent does not need any further PT services  PT Problem List         PT Treatment Interventions      PT Goals (Current goals can be found in the Care Plan section)  Acute Rehab PT Goals Patient Stated Goal: home PT Goal Formulation: All assessment and education complete, DC therapy    Frequency     Barriers to discharge        Co-evaluation               AM-PAC PT "6 Clicks" Mobility  Outcome Measure  Help needed turning from your back to your side while in a flat bed without using bedrails?: None Help needed moving from lying on your back to sitting on the side of a flat bed without using bedrails?: None Help needed moving to and from a bed to a chair (including a wheelchair)?: None Help needed standing up from a chair using your arms (e.g., wheelchair or bedside chair)?: None Help needed to walk in hospital room?: None Help needed climbing 3-5 steps with a railing? : None 6 Click Score: 24    End of Session   Activity Tolerance: Patient tolerated treatment well Patient left: in bed;with call bell/phone within reach        Time: 1137-1147 PT Time Calculation (min) (ACUTE ONLY): 10 min   Charges:   PT Evaluation $PT Eval Low Complexity: [place], PT Acute Rehabilitation  Office: [phone] Pager: [phone]

## 2020-01-29 NOTE — Progress Notes (Signed)
Patient ambulated in the hallway 120 feet. Saturation on room air sustained 92-94%. Activity tolerated well.

## 2020-01-29 NOTE — Progress Notes (Signed)
Patient complained of discomfort at port site. Site is not accessed at this time. V.O received Tylenol 650 mg PO once now. Will have IV team evaluate.

## 2020-01-29 NOTE — Progress Notes (Signed)
This nurse assessed patient's PORT site. She is not accessed at this time. Site is very tender with induration,and slight pinkness. Instructed nurse to notify MD. Fran Lowes, RN VAST

## 2020-01-29 NOTE — Progress Notes (Signed)
PROGRESS NOTE    Kim Castro  ERX:[id number] DOB: [birth year] DOA: 01/27/2020 PCP: Hulan Fess, MD    Chief Complaint  Patient presents with  . Shortness of Breath  . Fatigue    Brief Narrative:  HPI per Dr. [name] Ishihara on 01/27/2020 Kim Fiala McCoyis a 74 y.o.femalewith medical history significant ofbreast/colon CA. Presents with increasing dyspnea over last 5 - 6 weeks. She states that during that time she has found it increasingly difficult to catch her breath, but she didn't think much of it. She brushed it off as a side effect of her chemo. She notes that she is usually able to mow her lawn twice a week. Last week when she tried to [facility], she couldn't complete the task due to dyspnea. She reports no other aggravating or alleviating factors. She reports that she went to the doctor's office today for an infusion. When she was answering some screening questions, the office hit on her c/o dyspnea and recommended she come to the ED.  ED Course:CTA PE was obtained. She was found to be positive for bilat PE w/ right heart strain. She was started on heparin gtt. TRH was called for admission.   Assessment & Plan:   Active Problems:   Malignant neoplasm of female breast ([place])   Acute pulmonary embolism (HCC)   Acute lower UTI   Hypertension   Gastroesophageal reflux disease   Type 2 diabetes mellitus with hyperlipidemia (HCC)  #1 acute bilateral submassive pulmonary embolism Per CT angiogram chest acute bilateral PE noted with right heart strain.  2D echo with EF of 65 to 70%, no wall motion abnormalities, grade 1 diastolic dysfunction, elevated left atrial pressure, mildly enlarged right ventricular size, no increasing right ventricular wall thickness, mildly elevated pulmonary artery systolic pressure.  Lower extremity Dopplers negative for DVT.  Hypoxia improving.  Ambulatory pulse ox improved.  Patient on heparin.  Will transition to Eliquis. Follow.  2.  UTI Patient had  presented with dysuria and urinary frequency noted to have a leukocytosis.  Urinalysis not consistent with UTI.  Urine cultures not obtained.  Check urine cultures.  Continue Keflex and treat for 3 to 5 days.  3.  Hyperlipidemia Statin.  4.  Hypertension Continue daily Lasix, Cozaar.  5.  Gastroesophageal reflux disease PPI.  6.  History of breast cancer/colon cancer Per oncology.  7.  Diabetes mellitus type 2 Hemoglobin A1c 7.5.  CBG 157 this morning.  Continue to hold oral hypoglycemic agents.  Sliding scale insulin.    DVT prophylaxis: Heparin Code Status: Full Family Communication: Updated patient.  No family at bedside. Disposition:   Status is: Inpatient    Dispo: The patient is from: Home              Anticipated d/c is to: Home              Anticipated d/c date is: In 1 to 2 days              Patient currently on IV heparin being transitioned to a DOAC.  Not stable for discharge.       Consultants:   None  Procedures:   CT angiogram chest 01/27/2020  Chest x-ray 01/27/2020  2D echo 01/27/2020  Lower extremity Dopplers 01/28/2020  Antimicrobials:   Keflex 01/28/2020>>>>   Subjective: Patient sitting up in bed.  Denies any significant shortness of breath.  Stated hypoxia improved on ambulation today.  Denies any bleeding.  Asking when she is going to  be able to go home.  Complain of some discomfort around Port-A-Cath site.  Patient states dysuria and urinary frequency has improved.  Objective: Vitals:   01/28/20 2024 01/29/20 0423 01/29/20 1103 01/29/20 1402  BP: (!) 151/70 (!) 108/51 (!) 142/55 (!) 139/57  Pulse: 77 68 67 83  Resp: 20 18 18 20   Temp: 98.6 F (37 C) 98.1 F (36.7 C)  98.5 F (36.9 C)  TempSrc: Oral Oral  Oral  SpO2: 95% 93% 91% 96%  Weight:      Height:        Intake/Output Summary (Last 24 hours) at 01/29/2020 2029 Last data filed at 01/29/2020 1402 Gross per 24 hour  Intake 135.29 ml  Output 0 ml  Net 135.29 ml    Filed Weights   01/28/20 0110  Weight: 109.4 kg    Examination:  General exam: Appears calm and comfortable.  Left upper chest wall Port-A-Cath site with no signs of infection. Respiratory system: Clear to auscultation. Respiratory effort normal. Cardiovascular system: S1 & S2 heard, RRR. No JVD, murmurs, rubs, gallops or clicks. No pedal edema. Gastrointestinal system: Abdomen is nondistended, soft and nontender. No organomegaly or masses felt. Normal bowel sounds heard. Central nervous system: Alert and oriented. No focal neurological deficits. Extremities: Symmetric 5 x 5 power. Skin: No rashes, lesions or ulcers Psychiatry: Judgement and insight appear normal. Mood & affect appropriate.     Data Reviewed: I have personally reviewed following labs and imaging studies  CBC: Recent Labs  Lab 01/27/20 0834 01/28/20 0512 01/29/20 0502  WBC 16.5* 16.5* 13.4*  NEUTROABS 9.6*  --   --   HGB 11.1* 11.0* 11.2*  HCT 34.0* 34.0* 35.4*  MCV 93.7 96.0 96.7  PLT 173 172 678    Basic Metabolic Panel: Recent Labs  Lab 01/27/20 0834 01/28/20 0512 01/29/20 0502  NA 142 141 142  K 4.0 3.9 4.0  CL 109 106 104  CO2 26 26 27   GLUCOSE 145* 142* 144*  BUN 15 15 13   CREATININE 0.82 0.82 0.82  CALCIUM 9.3 9.1 9.0    GFR: Estimated Creatinine Clearance: 78.8 mL/min (by C-G formula based on SCr of 0.82 mg/dL).  Liver Function Tests: Recent Labs  Lab 01/27/20 0834 01/28/20 0512  AST 50* 36  ALT 38 32  ALKPHOS 131* 116  BILITOT 0.9 1.1  PROT 6.7 6.1*  ALBUMIN 3.3* 3.3*    CBG: Recent Labs  Lab 01/28/20 1659 01/28/20 2027 01/29/20 0754 01/29/20 1122 01/29/20 1524  GLUCAP 151* 174* 157* 140* 179*     Recent Results (from the past 240 hour(s))  Respiratory Panel by RT PCR (Flu A&B, Covid) - Nasopharyngeal Swab     Status: None   Collection Time: 01/27/20 11:48 AM   Specimen: Nasopharyngeal Swab  Result Value Ref Range Status   SARS Coronavirus 2 by RT PCR  NEGATIVE NEGATIVE Final    Comment: (NOTE) SARS-CoV-2 target nucleic acids are NOT DETECTED.  The SARS-CoV-2 RNA is generally detectable in upper respiratoy specimens during the acute phase of infection. The lowest concentration of SARS-CoV-2 viral copies this assay can detect is 131 copies/mL. A negative result does not preclude SARS-Cov-2 infection and should not be used as the sole basis for treatment or other patient management decisions. A negative result may occur with  improper specimen collection/handling, submission of specimen other than nasopharyngeal swab, presence of viral mutation(s) within the areas targeted by this assay, and inadequate number of viral copies (<131 copies/mL). A negative result  must be combined with clinical observations, patient history, and epidemiological information. The expected result is Negative.  Fact Sheet for Patients:  PinkCheek.be  Fact Sheet for Healthcare Providers:  GravelBags.it  This test is no t yet approved or cleared by the [place] FDA and  has been authorized for detection and/or diagnosis of SARS-CoV-2 by FDA under an Emergency Use Authorization (EUA). This EUA will remain  in effect (meaning this test can be used) for the duration of the COVID-19 declaration under Section 564(b)(1) of the Act, 21 U.S.C. section 360bbb-3(b)(1), unless the authorization is terminated or revoked sooner.     Influenza A by PCR NEGATIVE NEGATIVE Final   Influenza B by PCR NEGATIVE NEGATIVE Final    Comment: (NOTE) The Xpert Xpress SARS-CoV-2/FLU/RSV assay is intended as an aid in  the diagnosis of influenza from Nasopharyngeal swab specimens and  should not be used as a sole basis for treatment. Nasal washings and  aspirates are unacceptable for Xpert Xpress SARS-CoV-2/FLU/RSV  testing.  Fact Sheet for Patients: PinkCheek.be  Fact Sheet for  Healthcare Providers: GravelBags.it  This test is not yet approved or cleared by the [place] FDA and  has been authorized for detection and/or diagnosis of SARS-CoV-2 by  FDA under an Emergency Use Authorization (EUA). This EUA will remain  in effect (meaning this test can be used) for the duration of the  Covid-19 declaration under Section 564(b)(1) of the Act, 21  U.S.C. section 360bbb-3(b)(1), unless the authorization is  terminated or revoked. Performed at [facility], [place] [address] [place], [place] [address]          Radiology Studies: VAS [place] LOWER EXTREMITY VENOUS (DVT)  Result Date: 01/28/2020  Lower Venous DVTStudy Indications: Edema, and pulmonary embolism.  Limitations: Body habitus and poor ultrasound/tissue interface. Comparison Study: No prior study Performing Technologist: Maudry Mayhew MHA, RDMS, RVT, RDCS  Examination Guidelines: A complete evaluation includes B-mode imaging, spectral Doppler, color Doppler, and power Doppler as needed of all accessible portions of each vessel. Bilateral testing is considered an integral part of a complete examination. Limited examinations for reoccurring indications may be performed as noted. The reflux portion of the exam is performed with the patient in reverse Trendelenburg.  +---------+-------------------+---------+-----------+-------------+------------+ RIGHT    Compressibility    PhasicitySpontaneityProperties   Thrombus                                                                  Aging        +---------+-------------------+---------+-----------+-------------+------------+ CFV      Full               Yes      Yes                                  +---------+-------------------+---------+-----------+-------------+------------+ SFJ      Full                                                              +---------+-------------------+---------+-----------+-------------+------------+  FV Prox  Full                                                             +---------+-------------------+---------+-----------+-------------+------------+ FV Mid   Full                                                             +---------+-------------------+---------+-----------+-------------+------------+ FV DistalUnable to perform           Yes        Patent by                          compression                            color Doppler                      maneuver due to                                                           patient discomfort                                               +---------+-------------------+---------+-----------+-------------+------------+ POP      Full               Yes      Yes                                  +---------+-------------------+---------+-----------+-------------+------------+ PTV      Full                        Yes                                  +---------+-------------------+---------+-----------+-------------+------------+ PERO     Full                        Yes                                  +---------+-------------------+---------+-----------+-------------+------------+   +---------+---------------+---------+-----------+----------+--------------+ LEFT     CompressibilityPhasicitySpontaneityPropertiesThrombus Aging +---------+---------------+---------+-----------+----------+--------------+ CFV      Full           Yes      Yes                                 +---------+---------------+---------+-----------+----------+--------------+  SFJ      Full                                                        +---------+---------------+---------+-----------+----------+--------------+ FV Prox  Full                                                         +---------+---------------+---------+-----------+----------+--------------+ FV Mid   Full                                                        +---------+---------------+---------+-----------+----------+--------------+ FV DistalFull                                                        +---------+---------------+---------+-----------+----------+--------------+ PFV      Full                                                        +---------+---------------+---------+-----------+----------+--------------+ POP      Full           Yes      Yes                                 +---------+---------------+---------+-----------+----------+--------------+ PTV      Full                                                        +---------+---------------+---------+-----------+----------+--------------+ PERO     Full                                                        +---------+---------------+---------+-----------+----------+--------------+     Summary: RIGHT: - There is no evidence of deep vein thrombosis in the lower extremity. However, portions of this examination were limited- see technologist comments above.  - No cystic structure found in the popliteal fossa.  LEFT: - There is no evidence of deep vein thrombosis in the lower extremity. However, portions of this examination were limited- see technologist comments above.  - No cystic structure found in the popliteal fossa.  *See table(s) above for measurements and observations. Electronically signed by Deitra Mayo MD on 01/28/2020 at 6:19:46 PM.    Final         Scheduled Meds: . acetaminophen  650 mg Oral Once  . apixaban  10 mg Oral BID   Followed by  . [START ON 02/05/2020] apixaban  5 mg Oral BID  . aspirin EC  81 mg Oral Daily  . cephALEXin  500 mg Oral Q6H  . furosemide  20 mg Oral Daily  . insulin aspart  0-5 Units Subcutaneous QHS  . insulin aspart  0-9 Units Subcutaneous TID WC  . losartan  25 mg  Oral Daily  . potassium chloride  10 mEq Oral Daily  . pravastatin  10 mg Oral Daily   Continuous Infusions:    LOS: 2 days    Time spent: 35 minutes    Irine Seal, MD [facility]   To contact the attending provider between 7A-7P or the covering provider during after hours 7P-7A, please log into the web site [email or web address] and access using universal [place] password for that web site. If you do not have the password, please call the hospital operator.  01/29/2020, 8:29 PM

## 2020-01-29 NOTE — Progress Notes (Signed)
[place] for heparin Indication: pulmonary embolus  Allergies  Allergen Reactions  . Oxycodone Nausea And Vomiting    Patient Measurements: Height: 5\' 6"  (167.6 cm) Weight: 109.4 kg (241 lb 2.9 oz) (standing scale) IBW/kg (Calculated) : 59.3 Heparin Dosing Weight: 85 kg  Vital Signs: Temp: 98.1 F (36.7 C) (09/29 0423) Temp Source: Oral (09/29 0423) BP: 108/51 (09/29 0423) Pulse Rate: 68 (09/29 0423)  Labs: Recent Labs    01/27/20 0834 01/27/20 1341 01/27/20 2246 01/28/20 0512 01/28/20 0835 01/28/20 1813 01/29/20 0502  HGB 11.1*  --   --  11.0*  --   --  11.2*  HCT 34.0*  --   --  34.0*  --   --  35.4*  PLT 173  --   --  172  --   --  180  APTT  --  22*  --   --   --   --   --   LABPROT  --  13.0  --   --   --   --   --   INR  --  1.0  --   --   --   --   --   HEPARINUNFRC  --   --    < >  --  0.96* 0.71* 0.54  CREATININE 0.82  --   --  0.82  --   --  0.82  TROPONINIHS  --  11  --   --   --   --   --    < > = values in this interval not displayed.    Estimated Creatinine Clearance: 78.8 mL/min (by C-G formula based on SCr of 0.82 mg/dL).  Assessment: 72 yoF with PMH breast & colon CA presenting from the cancer center with concern for PE. CTA shows acute PE with evidence of right heart strain.   Baseline INR/aPTT: WNL Anticoagulation prior to admission: none  Today, 01/29/2020:  Hgb low but stable; Pltc stable, WNL  Heparin level = 0.54 units/mL, therapeutic  No bleeding or infusion related issues per RN  Goal of Therapy:  Heparin level 0.3-0.7 units/ml Monitor platelets by anticoagulation protocol: Yes   Plan:   Continue heparin infusion at 1400 units/hr  Noted plans to switch to Xarelto today   Order 8h confirmatory heparin level if plan changes  Daily heparin level and CBC while on heparin  Monitor for signs/symptoms of bleeding  Netta Cedars, PharmD, BCPS Clinical Pharmacist  01/29/2020,6:20  AM

## 2020-01-29 NOTE — Progress Notes (Signed)
[place] for heparin Indication: pulmonary embolus  Allergies  Allergen Reactions  . Oxycodone Nausea And Vomiting    Patient Measurements: Height: 5\' 6"  (167.6 cm) Weight: 109.4 kg (241 lb 2.9 oz) (standing scale) IBW/kg (Calculated) : 59.3 Heparin Dosing Weight: 85 kg  Vital Signs: Temp: 98.1 F (36.7 C) (09/29 0423) Temp Source: Oral (09/29 0423) BP: 142/55 (09/29 1103) Pulse Rate: 67 (09/29 1103)  Labs: Recent Labs    01/27/20 0834 01/27/20 1341 01/27/20 2246 01/28/20 0512 01/28/20 0835 01/28/20 1813 01/29/20 0502 01/29/20 1234  HGB 11.1*  --   --  11.0*  --   --  11.2*  --   HCT 34.0*  --   --  34.0*  --   --  35.4*  --   PLT 173  --   --  172  --   --  180  --   APTT  --  22*  --   --   --   --   --   --   LABPROT  --  13.0  --   --   --   --   --   --   INR  --  1.0  --   --   --   --   --   --   HEPARINUNFRC  --   --    < >  --    < > 0.71* 0.54 0.51  CREATININE 0.82  --   --  0.82  --   --  0.82  --   TROPONINIHS  --  11  --   --   --   --   --   --    < > = values in this interval not displayed.    Estimated Creatinine Clearance: 78.8 mL/min (by C-G formula based on SCr of 0.82 mg/dL).  Assessment: 55 yoF with PMH breast & colon CA presenting from the cancer center with concern for PE. CTA shows acute PE with evidence of right heart strain.   Baseline INR/aPTT: WNL Anticoagulation prior to admission: none  Today, 01/29/2020:  Hgb low but stable; Pltc stable, WNL  Heparin level remains therapeutic & stable  No bleeding or infusion related issues per RN  Goal of Therapy:  Heparin level 0.3-0.7 units/ml Monitor platelets by anticoagulation protocol: Yes   Plan:   Continue heparin infusion at 1400 units/hr  Daily heparin level and CBC while on heparin  Monitor for signs/symptoms of bleeding  Reuel Boom, PharmD, BCPS [phone] 01/29/2020, 1:56 PM

## 2020-01-30 DIAGNOSIS — Z170 Estrogen receptor positive status [ER+]: Secondary | ICD-10-CM

## 2020-01-30 DIAGNOSIS — C50919 Malignant neoplasm of unspecified site of unspecified female breast: Secondary | ICD-10-CM

## 2020-01-30 DIAGNOSIS — I159 Secondary hypertension, unspecified: Secondary | ICD-10-CM

## 2020-01-30 LAB — CBC
HCT: 36.4 % (ref 36.0–46.0)
Hemoglobin: 11.4 g/dL — ABNORMAL LOW (ref 12.0–15.0)
MCH: 30.3 pg (ref 26.0–34.0)
MCHC: 31.3 g/dL (ref 30.0–36.0)
MCV: 96.8 fL (ref 80.0–100.0)
Platelets: 193 10*3/uL (ref 150–400)
RBC: 3.76 MIL/uL — ABNORMAL LOW (ref 3.87–5.11)
RDW: 18.6 % — ABNORMAL HIGH (ref 11.5–15.5)
WBC: 12.8 10*3/uL — ABNORMAL HIGH (ref 4.0–10.5)
nRBC: 0.3 % — ABNORMAL HIGH (ref 0.0–0.2)

## 2020-01-30 LAB — GLUCOSE, CAPILLARY
Glucose-Capillary: 153 mg/dL — ABNORMAL HIGH (ref 70–99)
Glucose-Capillary: 193 mg/dL — ABNORMAL HIGH (ref 70–99)

## 2020-01-30 LAB — MAGNESIUM: Magnesium: 2.3 mg/dL (ref 1.7–2.4)

## 2020-01-30 LAB — BASIC METABOLIC PANEL
Anion gap: 11 (ref 5–15)
BUN: 17 mg/dL (ref 8–23)
CO2: 27 mmol/L (ref 22–32)
Calcium: 9.2 mg/dL (ref 8.9–10.3)
Chloride: 104 mmol/L (ref 98–111)
Creatinine, Ser: 0.8 mg/dL (ref 0.44–1.00)
GFR calc Af Amer: >60 mL/min (ref >60)
GFR calc non Af Amer: >60 mL/min (ref >60)
Glucose, Bld: 148 mg/dL — ABNORMAL HIGH (ref 70–99)
Potassium: 4 mmol/L (ref 3.5–5.1)
Sodium: 142 mmol/L (ref 135–145)

## 2020-01-30 MED ORDER — CEPHALEXIN 500 MG PO CAPS: 500.0000 mg | ORAL_CAPSULE | Freq: Four times a day (QID) | ORAL | 0 refills | Status: AC

## 2020-01-30 MED ORDER — APIXABAN 5 MG PO TABS
10.0000 mg | ORAL_TABLET | Freq: Two times a day (BID) | ORAL | 2 refills | Status: DC
Start: 2020-01-30 — End: 2020-05-18

## 2020-01-30 NOTE — Discharge Summary (Signed)
Physician Discharge Summary  Kim Castro ZYY:[id number] DOB: [birth year] DOA: 01/27/2020  PCP: Hulan Fess, MD  Admit date: 01/27/2020 Discharge date: 01/30/2020  Time spent: 50 minutes  Recommendations for Outpatient Follow-up:  1. Follow-up with Dr. Burr Medico 02/11/2020 as scheduled. 2. Follow-up with Hulan Fess, MD in 2 to 3 weeks.  On follow-up patient will need a CBC done to follow-up on H&H, basic metabolic profile done to follow-up on electrolytes and renal function.  Patient's diabetes and blood pressure need to be reassessed on follow-up.   Discharge Diagnoses:  Principal Problem:   Acute pulmonary embolism ([place]) Active Problems:   Malignant neoplasm of female breast ([place])   Acute lower UTI   Hypertension   Gastroesophageal reflux disease   Type 2 diabetes mellitus with hyperlipidemia ([place])   Discharge Condition: Stable and improved  Diet recommendation: Heart healthy  Filed Weights   01/28/20 0110  Weight: 109.4 kg    History of present illness:  HPI per Dr. Laural Roes Kim Castro is a 74 y.o. female with medical history significant of breast/colon CA. Presents with increasing dyspnea over last 5 - 6 weeks. She states that during that time she has found it increasingly difficult to catch her breath, but she didn't think much of it. She brushed it off as a side effect of her chemo. She notes that she is usually able to mow her lawn twice a week. Last week when she tried to [facility], she couldn't complete the task due to dyspnea. She reports no other aggravating or alleviating factors. She reports that she went to the doctor's office today for an infusion. When she was answering some screening questions, the office hit on her c/o dyspnea and recommended she come to the ED.    ED Course: CTA PE was obtained. She was found to be positive for bilat PE w/ right heart strain. She was started on heparin gtt. [facility] was called for admission.   Hospital Course:  1 acute  bilateral submassive pulmonary embolism Per CT angiogram chest acute bilateral PE noted with right heart strain.  2D echo with EF of 65 to 70%, no wall motion abnormalities, grade 1 diastolic dysfunction, elevated left atrial pressure, mildly enlarged right ventricular size, no increasing right ventricular wall thickness, mildly elevated pulmonary artery systolic pressure.  Lower extremity Dopplers negative for DVT.    Patient was placed on IV heparin.  Patient improved clinically subsequently transition to Eliquis.  Hypoxia improved such that by day of discharge patient was satting 92-93% on ambulation on room air.  Outpatient follow-up with hematology/oncology and PCP.   2.  UTI Patient had presented with dysuria and urinary frequency noted to have a leukocytosis.  Urinalysis was consistent with UTI.  Urine cultures not obtained.    Urine cultures ordered however patient has been on antibiotics.  Patient was on Keflex and will be discharged home on 3 more days of oral Keflex to complete a 5-day course of treatment.  Outpatient follow-up.   3.  Hyperlipidemia Patient maintained on home regimen statin.  4.  Hypertension Patient maintained on home regimen of Cozaar and Lasix.  Outpatient follow-up.   5.  Gastroesophageal reflux disease Patient maintained on a PPI.  6.  History of breast cancer/colon cancer Patient was seen by oncology during the hospitalization.  Outpatient follow-up.   7.  Diabetes mellitus type 2 Hemoglobin A1c 7.5.  Patient's oral hypoglycemic agents were held during the hospitalization and patient maintained on sliding scale insulin.  Oral hypoglycemic agents will be resumed on discharge.  Procedures:  CT angiogram chest 01/27/2020  Chest x-ray 01/27/2020  2D echo 01/27/2020  Lower extremity Dopplers 01/28/2020  Consultations:  Oncology: Dr. Burr Medico  Discharge Exam: Vitals:   01/30/20 1000 01/30/20 1200  BP: 122/72   Pulse: 79   Resp: 16   Temp: 98.9 F  (37.2 C)   SpO2: 94% 92%    General: NAD Cardiovascular: RRR Respiratory: CTAB  Discharge Instructions   Discharge Instructions    Diet - low sodium heart healthy   Complete by: As directed    Increase activity slowly   Complete by: As directed      Allergies as of 01/30/2020      Reactions   Oxycodone Nausea And Vomiting      Medication List    STOP taking these medications   aspirin EC 81 MG tablet     TAKE these medications   acetaminophen 500 MG tablet Commonly known as: TYLENOL Take 2 tablets (1,000 mg total) by mouth every 6 (six) hours as needed.   apixaban 5 MG Tabs tablet Commonly known as: ELIQUIS Take 2 tablets (10 mg total) by mouth 2 (two) times daily. Take 2 tablets (10mg ) 2 times daily x 7 days, then 1 tablet (5mg ) 2 times daily   cephALEXin 500 MG capsule Commonly known as: KEFLEX Take 1 capsule (500 mg total) by mouth every 6 (six) hours for 3 days.   furosemide 20 MG tablet Commonly known as: LASIX Take 1 tablet (20 mg total) by mouth daily.   ibuprofen 200 MG tablet Commonly known as: ADVIL Take 400 mg by mouth every 6 (six) hours as needed for moderate pain.   lidocaine-prilocaine cream Commonly known as: EMLA Apply to affected area once What changed:   how much to take  how to take this  when to take this  reasons to take this  additional instructions   loratadine 10 MG tablet Commonly known as: CLARITIN Take 10 mg by mouth daily as needed for allergies.   losartan 25 MG tablet Commonly known as: COZAAR Take 25 mg by mouth daily.   metFORMIN 500 MG tablet Commonly known as: GLUCOPHAGE Take 1 tablet (500 mg total) by mouth daily with breakfast. What changed: when to take this   multivitamin with minerals tablet Take 1 tablet by mouth daily.   omeprazole 20 MG capsule Commonly known as: PRILOSEC Take 20 mg by mouth daily before breakfast.   ondansetron 8 MG tablet Commonly known as: Zofran Take 1 tablet (8 mg  total) by mouth 2 (two) times daily as needed for refractory nausea / vomiting. Start on day 3 after chemotherapy.   potassium chloride 10 MEQ tablet Commonly known as: KLOR-CON Take 1 tablet (10 mEq total) by mouth daily.   pravastatin 10 MG tablet Commonly known as: PRAVACHOL Take 10 mg by mouth daily.   prochlorperazine 10 MG tablet Commonly known as: COMPAZINE Take 1 tablet (10 mg total) by mouth every 6 (six) hours as needed (Nausea or vomiting).   SLOW FE PO Take 1 tablet by mouth in the morning and at bedtime.   Vitamin D 50 MCG (2000 UT) Caps Take 2,000 Units by mouth daily.      Allergies  Allergen Reactions  . Oxycodone Nausea And Vomiting    Follow-up Information    Truitt Merle, MD Follow up on 02/11/2020.   Specialties: Hematology, Oncology Why: f/u as scheduled. Contact information: [place]  [place] [address] [phone]        Hulan Fess, MD. Schedule an appointment as soon as possible for a visit in 2 week(s).   Specialty: Family Medicine Why: f/u in 2-3 weeks. Contact information: [place] [address] [phone]                The results of significant diagnostics from this hospitalization (including imaging, microbiology, ancillary and laboratory) are listed below for reference.    Significant Diagnostic Studies: DG Chest 2 View  Result Date: 01/27/2020 CLINICAL DATA:  Dyspnea for 2 weeks EXAM: CHEST - 2 VIEW COMPARISON:  12/10/2019 FINDINGS: Right IJ Port-A-Cath, unchanged in positioning. The heart size and mediastinal contours are within normal limits. No focal airspace consolidation, pleural effusion, or pneumothorax. The visualized skeletal structures are unremarkable. IMPRESSION: No active cardiopulmonary disease. Electronically Signed   By: Davina Poke D.O.   On: 01/27/2020 10:51   CT Angio Chest PE W and/or Wo Contrast  Result Date: 01/27/2020 CLINICAL DATA:  Cough, shortness of  breath EXAM: CT ANGIOGRAPHY CHEST WITH CONTRAST TECHNIQUE: Multidetector CT imaging of the chest was performed using the standard protocol during bolus administration of intravenous contrast. Multiplanar CT image reconstructions and MIPs were obtained to evaluate the vascular anatomy. CONTRAST:  141mL OMNIPAQUE IOHEXOL 350 MG/ML SOLN COMPARISON:  10/16/2019 FINDINGS: Cardiovascular: Filling defects are noted within the pulmonary arteries in the lower lobes and right middle lobe compatible with bilateral pulmonary emboli. Slight elevated RV: LV ratio at 0.98 suggesting mild right heart strain. Heart is mildly enlarged. Aorta normal caliber. Mediastinum/Nodes: No mediastinal, hilar, or axillary adenopathy. Trachea and esophagus are unremarkable. Left thyroid nodule measures up to 2.4 cm. Lungs/Pleura: Scarring noted in the left upper lobe/lingula peripherally possibly related to prior left breast radiation. No acute confluent opacities or effusions. Upper Abdomen: Imaging into the upper abdomen demonstrates no acute findings. Musculoskeletal: Chest wall soft tissues are unremarkable. No acute bony abnormality. Review of the MIP images confirms the above findings. IMPRESSION: Positive for acute PE with CTevidence of right heart strain (RV/LV Ratio = 0.98) consistent with at least submassive (intermediate risk) PE. The presence of right heart strain has been associated with an increased risk of morbidity and mortality. 2.4 cm left thyroid nodule. Recommend thyroid US (ref: J Am Coll Radiol. 2015 Feb;12(2): 143-50). These results were called by telephone at the time of interpretation on 01/27/2020 at 1:18 pm to provider [facility] , who verbally acknowledged these results. Electronically Signed   By: Rolm Baptise M.D.   On: 01/27/2020 13:21   ECHOCARDIOGRAM COMPLETE  Result Date: 01/27/2020    ECHOCARDIOGRAM REPORT   Patient Name:   MALLORI ARAQUE [facility] Date of Exam: 01/27/2020 Medical Rec #:  [id number]           Height:       66.0 in Accession #:    [id number]         Weight:       242.6 lb Date of Birth:  21-Jul-1945          BSA:          2.171 m Patient Age:    23 years           BP:           150/98 mmHg Patient Gender: F                  HR:  82 bpm. Exam Location:  Inpatient Procedure: 2D Echo Indications:    Pulmonary Embolus I26.99  History:        Patient has no prior history of Echocardiogram examinations.                 Risk Factors:Hypertension and Diabetes.  Sonographer:    Mikki Santee RDCS (AE) Referring Phys: [id number] MARIA A Pelham  1. Left ventricular ejection fraction, by estimation, is 65 to 70%. The left ventricle has normal function. The left ventricle has no regional wall motion abnormalities. Left ventricular diastolic parameters are consistent with Grade I diastolic dysfunction (impaired relaxation). Elevated left atrial pressure.  2. Right ventricular systolic function is normal. The right ventricular size is mildly enlarged. There is mildly elevated pulmonary artery systolic pressure. The estimated right ventricular systolic pressure is 09.8 mmHg.  3. Left atrial size was mildly dilated.  4. The mitral valve is normal in structure. Trivial mitral valve regurgitation. No evidence of mitral stenosis.  5. The aortic valve is normal in structure. There is moderate calcification of the aortic valve. There is moderate thickening of the aortic valve. Aortic valve regurgitation is not visualized. No aortic stenosis is present.  6. The inferior vena cava is normal in size with greater than 50% respiratory variability, suggesting right atrial pressure of 3 mmHg. FINDINGS  Left Ventricle: Left ventricular ejection fraction, by estimation, is 65 to 70%. The left ventricle has normal function. The left ventricle has no regional wall motion abnormalities. The left ventricular internal cavity size was normal in size. There is  no left ventricular hypertrophy. Left ventricular diastolic  parameters are consistent with Grade I diastolic dysfunction (impaired relaxation). Elevated left atrial pressure. Right Ventricle: The right ventricular size is mildly enlarged. No increase in right ventricular wall thickness. Right ventricular systolic function is normal. There is mildly elevated pulmonary artery systolic pressure. The tricuspid regurgitant velocity is 2.57 m/s, and with an assumed right atrial pressure of 8 mmHg, the estimated right ventricular systolic pressure is 11.9 mmHg. Left Atrium: Left atrial size was mildly dilated. Right Atrium: Right atrial size was normal in size. Pericardium: There is no evidence of pericardial effusion. Mitral Valve: The mitral valve is normal in structure. There is mild thickening of the mitral valve leaflet(s). There is mild calcification of the mitral valve leaflet(s). Trivial mitral valve regurgitation. No evidence of mitral valve stenosis. Tricuspid Valve: The tricuspid valve is normal in structure. Tricuspid valve regurgitation is mild . No evidence of tricuspid stenosis. Aortic Valve: The aortic valve is normal in structure. There is moderate calcification of the aortic valve. There is moderate thickening of the aortic valve. Aortic valve regurgitation is not visualized. No aortic stenosis is present. Pulmonic Valve: The pulmonic valve was normal in structure. Pulmonic valve regurgitation is not visualized. No evidence of pulmonic stenosis. Aorta: The aortic root is normal in size and structure. Venous: The inferior vena cava is normal in size with greater than 50% respiratory variability, suggesting right atrial pressure of 3 mmHg. IAS/Shunts: No atrial level shunt detected by color flow Doppler.  LEFT VENTRICLE PLAX 2D LVIDd:         4.90 cm  Diastology LVIDs:         3.00 cm  LV e' medial:    5.87 cm/s LV PW:         1.00 cm  LV E/e' medial:  14.7 LV IVS:        1.00 cm  LV e'  lateral:   8.92 cm/s LVOT diam:     2.30 cm  LV E/e' lateral: 9.7 LV SV:          90 LV SV Index:   42 LVOT Area:     4.15 cm  RIGHT VENTRICLE RV S prime:     12.60 cm/s TAPSE (M-mode): 2.3 cm LEFT ATRIUM             Index       RIGHT ATRIUM           Index LA diam:        4.00 cm 1.84 cm/m  RA Area:     11.90 cm LA Vol (A2C):   83.1 ml 38.28 ml/m RA Volume:   24.40 ml  11.24 ml/m LA Vol (A4C):   53.0 ml 24.41 ml/m LA Biplane Vol: 65.7 ml 30.26 ml/m  AORTIC VALVE LVOT Vmax:   83.00 cm/s LVOT Vmean:  58.800 cm/s LVOT VTI:    0.217 m  AORTA Ao Root diam: 3.10 cm MITRAL VALVE                TRICUSPID VALVE MV Area (PHT): 2.24 cm     TR Peak grad:   26.4 mmHg MV Decel Time: 338 msec     TR Vmax:        257.00 cm/s MV E velocity: 86.30 cm/s MV A velocity: 113.00 cm/s  SHUNTS MV E/A ratio:  0.76         Systemic VTI:  0.22 m                             Systemic Diam: 2.30 cm Ena Dawley MD Electronically signed by Ena Dawley MD Signature Date/Time: 01/27/2020/3:56:42 PM    Final    VAS [place] LOWER EXTREMITY VENOUS (DVT)  Result Date: 01/28/2020  Lower Venous DVTStudy Indications: Edema, and pulmonary embolism.  Limitations: Body habitus and poor ultrasound/tissue interface. Comparison Study: No prior study Performing Technologist: Maudry Mayhew MHA, RDMS, RVT, RDCS  Examination Guidelines: A complete evaluation includes B-mode imaging, spectral Doppler, color Doppler, and power Doppler as needed of all accessible portions of each vessel. Bilateral testing is considered an integral part of a complete examination. Limited examinations for reoccurring indications may be performed as noted. The reflux portion of the exam is performed with the patient in reverse Trendelenburg.  +---------+-------------------+---------+-----------+-------------+------------+ RIGHT    Compressibility    PhasicitySpontaneityProperties   Thrombus                                                                  Aging         +---------+-------------------+---------+-----------+-------------+------------+ CFV      Full               Yes      Yes                                  +---------+-------------------+---------+-----------+-------------+------------+ SFJ      Full                                                             +---------+-------------------+---------+-----------+-------------+------------+  FV Prox  Full                                                             +---------+-------------------+---------+-----------+-------------+------------+ FV Mid   Full                                                             +---------+-------------------+---------+-----------+-------------+------------+ FV DistalUnable to perform           Yes        Patent by                          compression                            color Doppler                      maneuver due to                                                           patient discomfort                                               +---------+-------------------+---------+-----------+-------------+------------+ POP      Full               Yes      Yes                                  +---------+-------------------+---------+-----------+-------------+------------+ PTV      Full                        Yes                                  +---------+-------------------+---------+-----------+-------------+------------+ PERO     Full                        Yes                                  +---------+-------------------+---------+-----------+-------------+------------+   +---------+---------------+---------+-----------+----------+--------------+ LEFT     CompressibilityPhasicitySpontaneityPropertiesThrombus Aging +---------+---------------+---------+-----------+----------+--------------+ CFV      Full           Yes      Yes                                  +---------+---------------+---------+-----------+----------+--------------+  SFJ      Full                                                        +---------+---------------+---------+-----------+----------+--------------+ FV Prox  Full                                                        +---------+---------------+---------+-----------+----------+--------------+ FV Mid   Full                                                        +---------+---------------+---------+-----------+----------+--------------+ FV DistalFull                                                        +---------+---------------+---------+-----------+----------+--------------+ PFV      Full                                                        +---------+---------------+---------+-----------+----------+--------------+ POP      Full           Yes      Yes                                 +---------+---------------+---------+-----------+----------+--------------+ PTV      Full                                                        +---------+---------------+---------+-----------+----------+--------------+ PERO     Full                                                        +---------+---------------+---------+-----------+----------+--------------+     Summary: RIGHT: - There is no evidence of deep vein thrombosis in the lower extremity. However, portions of this examination were limited- see technologist comments above.  - No cystic structure found in the popliteal fossa.  LEFT: - There is no evidence of deep vein thrombosis in the lower extremity. However, portions of this examination were limited- see technologist comments above.  - No cystic structure found in the popliteal fossa.  *See table(s) above for measurements and observations. Electronically signed by Deitra Mayo MD on 01/28/2020 at 37:19:46 PM.    Final     Microbiology: Recent Results (from the past 240 hour(s))  Respiratory Panel by RT PCR (Flu A&B, Covid) - Nasopharyngeal Swab     Status: None   Collection Time: 01/27/20 11:48 AM   Specimen: Nasopharyngeal Swab  Result Value Ref Range Status   SARS Coronavirus 2 by RT PCR NEGATIVE NEGATIVE Final    Comment: (NOTE) SARS-CoV-2 target nucleic acids are NOT DETECTED.  The SARS-CoV-2 RNA is generally detectable in upper respiratoy specimens during the acute phase of infection. The lowest concentration of SARS-CoV-2 viral copies this assay can detect is 131 copies/mL. A negative result does not preclude SARS-Cov-2 infection and should not be used as the sole basis for treatment or other patient management decisions. A negative result may occur with  improper specimen collection/handling, submission of specimen other than nasopharyngeal swab, presence of viral mutation(s) within the areas targeted by this assay, and inadequate number of viral copies (<131 copies/mL). A negative result must be combined with clinical observations, patient history, and epidemiological information. The expected result is Negative.  Fact Sheet for Patients:  PinkCheek.be  Fact Sheet for Healthcare Providers:  GravelBags.it  This test is no t yet approved or cleared by the [place] FDA and  has been authorized for detection and/or diagnosis of SARS-CoV-2 by FDA under an Emergency Use Authorization (EUA). This EUA will remain  in effect (meaning this test can be used) for the duration of the COVID-19 declaration under Section 564(b)(1) of the Act, 21 U.S.C. section 360bbb-3(b)(1), unless the authorization is terminated or revoked sooner.     Influenza A by PCR NEGATIVE NEGATIVE Final   Influenza B by PCR NEGATIVE NEGATIVE Final    Comment: (NOTE) The Xpert Xpress SARS-CoV-2/FLU/RSV assay is intended as an aid in  the diagnosis of influenza from Nasopharyngeal swab specimens and  should not be used as  a sole basis for treatment. Nasal washings and  aspirates are unacceptable for Xpert Xpress SARS-CoV-2/FLU/RSV  testing.  Fact Sheet for Patients: PinkCheek.be  Fact Sheet for Healthcare Providers: GravelBags.it  This test is not yet approved or cleared by the [place] FDA and  has been authorized for detection and/or diagnosis of SARS-CoV-2 by  FDA under an Emergency Use Authorization (EUA). This EUA will remain  in effect (meaning this test can be used) for the duration of the  Covid-19 declaration under Section 564(b)(1) of the Act, 21  U.S.C. section 360bbb-3(b)(1), unless the authorization is  terminated or revoked. Performed at [facility], [place] [address] [place], [place] [address]      Labs: Basic Metabolic Panel: Recent Labs  Lab 01/27/20 0834 01/28/20 0512 01/29/20 0502 01/30/20 0429  NA 142 141 142 142  K 4.0 3.9 4.0 4.0  CL 109 106 104 104  CO2 26 26 27 27   GLUCOSE 145* 142* 144* 148*  BUN 15 15 13 17   CREATININE 0.82 0.82 0.82 0.80  CALCIUM 9.3 9.1 9.0 9.2  MG  --   --   --  2.3   Liver Function Tests: Recent Labs  Lab 01/27/20 0834 01/28/20 0512  AST 50* 36  ALT 38 32  ALKPHOS 131* 116  BILITOT 0.9 1.1  PROT 6.7 6.1*  ALBUMIN 3.3* 3.3*   No results for input(s): LIPASE, AMYLASE in the last 168 hours. No results for input(s): AMMONIA in the last 168 hours. CBC: Recent Labs  Lab 01/27/20 0834 01/28/20 0512 01/29/20 0502 01/30/20 0429  WBC 16.5* 16.5* 13.4* 12.8*  NEUTROABS 9.6*  --   --   --   HGB  11.1* 11.0* 11.2* 11.4*  HCT 34.0* 34.0* 35.4* 36.4  MCV 93.7 96.0 96.7 96.8  PLT 173 172 180 193   Cardiac Enzymes: No results for input(s): CKTOTAL, CKMB, CKMBINDEX, TROPONINI in the last 168 hours. BNP: BNP (last 3 results) Recent Labs    01/27/20 1156  BNP 150.7*    ProBNP (last 3 results) No results for input(s): PROBNP in the last 8760  hours.  CBG: Recent Labs  Lab 01/29/20 1122 01/29/20 1524 01/29/20 2136 01/30/20 0741 01/30/20 1148  GLUCAP 140* 179* 166* 153* 193*       Signed:  Irine Seal MD.  [facility] 01/30/2020, 1:39 PM

## 2020-01-30 NOTE — Progress Notes (Signed)
OT Cancellation Note  Patient Details Name: Kim Castro MRN: [id number] DOB: Sep 21, 1945   Cancelled Treatment:    Reason Eval/Treat Not Completed: Patient at procedure or test/ unavailable. Patient not in room when therapist came by. Will f/u as able.  Velvet Moomaw L [place] 01/30/2020, 3:55 PM

## 2020-01-30 NOTE — Progress Notes (Addendum)
HEMATOLOGY-ONCOLOGY PROGRESS NOTE  SUBJECTIVE: Feels well this morning.  Wants to go home.  Heparin drip has been discontinued and she has been started on Eliquis.  No bleeding reported.  Remains afebrile.  Reports that she has been ambulating in the hallway without much difficulty.  Oncology History Overview Note  Cancer Staging Breast cancer of upper-outer quadrant of left female breast [facility]) Staging form: Breast, AJCC 7th Edition - Clinical stage from 06/11/2014: Stage 0 (Tis (DCIS), N0, M0) - Unsigned - Pathologic stage from 07/03/2014: Stage Unknown (Tis (DCIS), NX, cM0) - Signed by Enid Cutter, MD on 07/10/2014 Staging comments: Staged on final lumpectomy specimen by Dr. Donato Heinz.  right colon cancer Staging form: Colon and Rectum, AJCC 8th Edition - Pathologic stage from 11/01/2019: Stage IIIB (pT3, pN2a, cM0) - Signed by Truitt Merle, MD on 11/29/2019    Malignant neoplasm of female breast ([place])  05/29/2014 Initial Biopsy   Left breast needle core biopsy: Grade 2, DCIS with calcs. ER+ (100%), PR+ (96%).    06/04/2014 Initial Diagnosis   Left breast DCIS with calcifications, ER 100%, PR 96%   06/10/2014 Breast MRI   Left breast: 2.4 x 1.3 x 1.1 cm area of patchy non-mass enhancement upper outer quadrant includes postbiopsy seroma; Right breast: 1.2 cm previously biopsied stable benign fibroadenoma   06/12/2014 Procedure   Genetic counseling/testing: Identified 1 VUS on CHEK2 gene. Remainder of 17 gene panel tested negative and included: ATM, BARD1, BRCA 1/2, BRIP1, CDH1, CHEK2, EPCAM, MLH1, MSH2, MSH6, NBN, NF1, PALB2, PTEN, RAD50, RAD51C, RAD51D, STK11, and TP53.    07/01/2014 Surgery   Left breast lumpectomy ([facility]): Grade 1, DCIS, spanning 2.3 cm, 1 mm margin, ER 100%, PR 96%   07/31/2014 - 08/28/2014 Radiation Therapy   Adjuvant RT completed Pablo Ledger). Left breast: Total dose 42.5 Gy over 17 fractions. Left breast boost: Total dose 7.5 Gy over 3 fractions.    09/14/2014 - 01/13/2020  Anti-estrogen oral therapy   Anastrazole $RemoveBefo'1mg'LvyJmiXtqHK$  daily. Planned duration of treatment: 5 years [place]). Completed in 01/2020.    09/25/2014 Survivorship   Survivorship Care Plan given to patient and reviewed with her in person.    right colon cancer  09/19/2019 Imaging   CT AP W contrast 09/19/19  IMPRESSION Fullness in the cecum, cannot exclude a mass. No evidence for metastatic disease is identified.    09/24/2019 Procedure   Colonoscopy by Dr Eber Jones 09/24/19 IMPRESSION 1. The colon was redundant  2. Mild diverticulosis was noted through the entire examined colon 3. Single 9mm polyp was found in the ascending colon; polypectomy was performed using snare cautery and biopsy forceps 4. Mild diverticulosis was notes in the descending colon and sigmoid colon.  5. Single polyp was found in the sigmoid colon, polypectomy was performed with cold forceps.  6. Single polyp was found in the rectosigmoid colon; polypectomy was performed with cold snare  7. Small internal hemorrhoids  8. Large mass was found at the cecum; multiple biopsies of the area were performed using cold forceps; injection (tattooing) was performed distal to the mass.    09/24/2019 Initial Biopsy   INTERPRETATION AND DIAGNOSIS:  A. Cecum, biopsy:  Invasive moderately differentiated adenocarcinoma.  see comment  B. Polyp @ ascending colon, polypectomy:  Tubular Adenoma  C. Polyp @ sigmoid colon Polypectomy:  hyperplastic polyp.  D. Polyp @ rectosigmoid colon, Polypectomy:  Hyperplastic Polyp      10/16/2019 Imaging   CT Chest IMPRESSION: 1. Multiple small pulmonary nodules measuring 5 mm or less  in size in the lungs. These are nonspecific and are typically considered statistically likely benign. However, given the patient's history of primary malignancy, close attention on follow-up studies is recommended to ensure stability. 2. Aortic atherosclerosis, in addition to right coronary artery disease. Assessment for  potential risk factor modification, dietary therapy or pharmacologic therapy may be warranted, if clinically indicated. 3. There are calcifications of the aortic valve and mitral annulus. Echocardiographic correlation for evaluation of potential valvular dysfunction may be warranted if clinically indicated. 4. Small hiatal hernia.   Aortic Atherosclerosis (ICD10-I70.0).   11/01/2019 Initial Diagnosis   Colon cancer ([place])   11/01/2019 Surgery   LAPAROSCOPIC PARTIAL COLECTOMY by Dr Marcello Moores and Dr Johney Maine   11/01/2019 Pathology Results   FINAL MICROSCOPIC DIAGNOSIS:   A. COLON, PROXIMAL RIGHT, COLECTOMY:  - Invasive colonic adenocarcinoma, 5 cm.  - Tumor invades through the muscularis propria into pericolonic tissues.   - Margins of resection are not involved.  - Metastatic carcinoma in (5) of (13) lymph nodes.  - See oncology table.    MSI Stable  Mismatch repair normal  MLH1 - Preserved nuclear expression (greater 50% tumor expression) MSH2 - Preserved nuclear expression (greater 50% tumor expression) MSH6 - Preserved nuclear expression (greater 50% tumor expression) PMS2 - Preserved nuclear expression (greater 50% tumor expression)   11/01/2019 Cancer Staging   Staging form: Colon and Rectum, AJCC 8th Edition - Pathologic stage from 11/01/2019: Stage IIIB (pT3, pN2a, cM0) - Signed by Truitt Merle, MD on 11/29/2019   12/10/2019 Procedure   PAC placed 12/10/19   12/17/2019 -  Chemotherapy   FOLFOX q2weeks starting in 2 weeks starting 12/17/19      REVIEW OF SYSTEMS:   Constitutional: Denies fevers, chills or abnormal weight loss Eyes: Denies blurriness of vision Ears, nose, mouth, throat, and face: Denies mucositis or sore throat Respiratory: Reports minimal shortness of breath with exertion Cardiovascular: Denies palpitation, chest discomfort Gastrointestinal:  Denies nausea, heartburn or change in bowel habits Skin: Denies abnormal skin rashes Lymphatics: Denies new  lymphadenopathy or easy bruising Neurological:Denies numbness, tingling or new weaknesses Behavioral/Psych: Mood is stable, no new changes  Extremities: Has persistent bilateral lower extremity edema All other systems were reviewed with the patient and are negative.  I have reviewed the past medical history, past surgical history, social history and family history with the patient and they are unchanged from previous note.   PHYSICAL EXAMINATION: ECOG PERFORMANCE STATUS: 1 - Symptomatic but completely ambulatory  Vitals:   01/30/20 0516 01/30/20 1000  BP: (!) 124/51 122/72  Pulse: 62 79  Resp: 18 16  Temp: 98.4 F (36.9 C) 98.9 F (37.2 C)  SpO2: 95% 94%   Filed Weights   01/28/20 0110  Weight: 109.4 kg    Intake/Output from previous day: 09/29 0701 - 09/30 0700 In: 200 [P.O.:200] Out: 0   GENERAL:alert, no distress and comfortable SKIN: skin color, texture, turgor are normal, no rashes or significant lesions EYES: normal, Conjunctiva are pink and non-injected, sclera clear OROPHARYNX:no exudate, no erythema and lips, buccal mucosa, and tongue normal  NECK: supple, thyroid normal size, non-tender, without nodularity LYMPH:  no palpable lymphadenopathy in the cervical, axillary or inguinal LUNGS: clear to auscultation and percussion with normal breathing effort HEART: regular rate & rhythm and no murmurs and trace lower extremity edema bilaterally ABDOMEN:abdomen soft, non-tender and normal bowel sounds Musculoskeletal:no cyanosis of digits and no clubbing  NEURO: alert & oriented x 3 with fluent speech, no focal motor/sensory deficits  LABORATORY DATA:  I have reviewed the data as listed CMP Latest Ref Rng & Units 01/30/2020 01/29/2020 01/28/2020  Glucose 70 - 99 mg/dL 148(H) 144(H) 142(H)  BUN 8 - 23 mg/dL $Remove'17 13 15  'zHrIruW$ Creatinine 0.44 - 1.00 mg/dL 0.80 0.82 0.82  Sodium 135 - 145 mmol/L 142 142 141  Potassium 3.5 - 5.1 mmol/L 4.0 4.0 3.9  Chloride 98 - 111 mmol/L 104  104 106  CO2 22 - 32 mmol/L $RemoveB'27 27 26  'luXwxaod$ Calcium 8.9 - 10.3 mg/dL 9.2 9.0 9.1  Total Protein 6.5 - 8.1 g/dL - - 6.1(L)  Total Bilirubin 0.3 - 1.2 mg/dL - - 1.1  Alkaline Phos 38 - 126 U/L - - 116  AST 15 - 41 U/L - - 36  ALT 0 - 44 U/L - - 32    Lab Results  Component Value Date   WBC 12.8 (H) 01/30/2020   HGB 11.4 (L) 01/30/2020   HCT 36.4 01/30/2020   MCV 96.8 01/30/2020   PLT 193 01/30/2020   NEUTROABS 9.6 (H) 01/27/2020    DG Chest 2 View  Result Date: 01/27/2020 CLINICAL DATA:  Dyspnea for 2 weeks EXAM: CHEST - 2 VIEW COMPARISON:  12/10/2019 FINDINGS: Right IJ Port-A-Cath, unchanged in positioning. The heart size and mediastinal contours are within normal limits. No focal airspace consolidation, pleural effusion, or pneumothorax. The visualized skeletal structures are unremarkable. IMPRESSION: No active cardiopulmonary disease. Electronically Signed   By: Davina Poke D.O.   On: 01/27/2020 10:51   CT Angio Chest PE W and/or Wo Contrast  Result Date: 01/27/2020 CLINICAL DATA:  Cough, shortness of breath EXAM: CT ANGIOGRAPHY CHEST WITH CONTRAST TECHNIQUE: Multidetector CT imaging of the chest was performed using the standard protocol during bolus administration of intravenous contrast. Multiplanar CT image reconstructions and MIPs were obtained to evaluate the vascular anatomy. CONTRAST:  13mL OMNIPAQUE IOHEXOL 350 MG/ML SOLN COMPARISON:  10/16/2019 FINDINGS: Cardiovascular: Filling defects are noted within the pulmonary arteries in the lower lobes and right middle lobe compatible with bilateral pulmonary emboli. Slight elevated RV: LV ratio at 0.98 suggesting mild right heart strain. Heart is mildly enlarged. Aorta normal caliber. Mediastinum/Nodes: No mediastinal, hilar, or axillary adenopathy. Trachea and esophagus are unremarkable. Left thyroid nodule measures up to 2.4 cm. Lungs/Pleura: Scarring noted in the left upper lobe/lingula peripherally possibly related to prior left  breast radiation. No acute confluent opacities or effusions. Upper Abdomen: Imaging into the upper abdomen demonstrates no acute findings. Musculoskeletal: Chest wall soft tissues are unremarkable. No acute bony abnormality. Review of the MIP images confirms the above findings. IMPRESSION: Positive for acute PE with CTevidence of right heart strain (RV/LV Ratio = 0.98) consistent with at least submassive (intermediate risk) PE. The presence of right heart strain has been associated with an increased risk of morbidity and mortality. 2.4 cm left thyroid nodule. Recommend thyroid US (ref: J Am Coll Radiol. 2015 Feb;12(2): 143-50). These results were called by telephone at the time of interpretation on 01/27/2020 at 1:18 pm to provider [facility] , who verbally acknowledged these results. Electronically Signed   By: Rolm Baptise M.D.   On: 01/27/2020 13:21   ECHOCARDIOGRAM COMPLETE  Result Date: 01/27/2020    ECHOCARDIOGRAM REPORT   Patient Name:   ABIGAIL TEALL [facility] Date of Exam: 01/27/2020 Medical Rec #:  [id number]          Height:       66.0 in Accession #:    [id number]  Weight:       242.6 lb Date of Birth:  [birth year]          BSA:          2.171 m Patient Age:    35 years           BP:           150/98 mmHg Patient Gender: F                  HR:           82 bpm. Exam Location:  Inpatient Procedure: 2D Echo Indications:    Pulmonary Embolus I26.99  History:        Patient has no prior history of Echocardiogram examinations.                 Risk Factors:Hypertension and Diabetes.  Sonographer:    Mikki Santee RDCS (AE) Referring Phys: [id number] MARIA A [place]  1. Left ventricular ejection fraction, by estimation, is 65 to 70%. The left ventricle has normal function. The left ventricle has no regional wall motion abnormalities. Left ventricular diastolic parameters are consistent with Grade I diastolic dysfunction (impaired relaxation). Elevated left atrial pressure.  2. Right ventricular  systolic function is normal. The right ventricular size is mildly enlarged. There is mildly elevated pulmonary artery systolic pressure. The estimated right ventricular systolic pressure is 61.6 mmHg.  3. Left atrial size was mildly dilated.  4. The mitral valve is normal in structure. Trivial mitral valve regurgitation. No evidence of mitral stenosis.  5. The aortic valve is normal in structure. There is moderate calcification of the aortic valve. There is moderate thickening of the aortic valve. Aortic valve regurgitation is not visualized. No aortic stenosis is present.  6. The inferior vena cava is normal in size with greater than 50% respiratory variability, suggesting right atrial pressure of 3 mmHg. FINDINGS  Left Ventricle: Left ventricular ejection fraction, by estimation, is 65 to 70%. The left ventricle has normal function. The left ventricle has no regional wall motion abnormalities. The left ventricular internal cavity size was normal in size. There is  no left ventricular hypertrophy. Left ventricular diastolic parameters are consistent with Grade I diastolic dysfunction (impaired relaxation). Elevated left atrial pressure. Right Ventricle: The right ventricular size is mildly enlarged. No increase in right ventricular wall thickness. Right ventricular systolic function is normal. There is mildly elevated pulmonary artery systolic pressure. The tricuspid regurgitant velocity is 2.57 m/s, and with an assumed right atrial pressure of 8 mmHg, the estimated right ventricular systolic pressure is 07.3 mmHg. Left Atrium: Left atrial size was mildly dilated. Right Atrium: Right atrial size was normal in size. Pericardium: There is no evidence of pericardial effusion. Mitral Valve: The mitral valve is normal in structure. There is mild thickening of the mitral valve leaflet(s). There is mild calcification of the mitral valve leaflet(s). Trivial mitral valve regurgitation. No evidence of mitral valve stenosis.  Tricuspid Valve: The tricuspid valve is normal in structure. Tricuspid valve regurgitation is mild . No evidence of tricuspid stenosis. Aortic Valve: The aortic valve is normal in structure. There is moderate calcification of the aortic valve. There is moderate thickening of the aortic valve. Aortic valve regurgitation is not visualized. No aortic stenosis is present. Pulmonic Valve: The pulmonic valve was normal in structure. Pulmonic valve regurgitation is not visualized. No evidence of pulmonic stenosis. Aorta: The aortic root is normal in size and structure. Venous: The inferior vena cava  is normal in size with greater than 50% respiratory variability, suggesting right atrial pressure of 3 mmHg. IAS/Shunts: No atrial level shunt detected by color flow Doppler.  LEFT VENTRICLE PLAX 2D LVIDd:         4.90 cm  Diastology LVIDs:         3.00 cm  LV e' medial:    5.87 cm/s LV PW:         1.00 cm  LV E/e' medial:  14.7 LV IVS:        1.00 cm  LV e' lateral:   8.92 cm/s LVOT diam:     2.30 cm  LV E/e' lateral: 9.7 LV SV:         90 LV SV Index:   42 LVOT Area:     4.15 cm  RIGHT VENTRICLE RV S prime:     12.60 cm/s TAPSE (M-mode): 2.3 cm LEFT ATRIUM             Index       RIGHT ATRIUM           Index LA diam:        4.00 cm 1.84 cm/m  RA Area:     11.90 cm LA Vol (A2C):   83.1 ml 38.28 ml/m RA Volume:   24.40 ml  11.24 ml/m LA Vol (A4C):   53.0 ml 24.41 ml/m LA Biplane Vol: 65.7 ml 30.26 ml/m  AORTIC VALVE LVOT Vmax:   83.00 cm/s LVOT Vmean:  58.800 cm/s LVOT VTI:    0.217 m  AORTA Ao Root diam: 3.10 cm MITRAL VALVE                TRICUSPID VALVE MV Area (PHT): 2.24 cm     TR Peak grad:   26.4 mmHg MV Decel Time: 338 msec     TR Vmax:        257.00 cm/s MV E velocity: 86.30 cm/s MV A velocity: 113.00 cm/s  SHUNTS MV E/A ratio:  0.76         Systemic VTI:  0.22 m                             Systemic Diam: 2.30 cm Ena Dawley MD Electronically signed by Ena Dawley MD Signature Date/Time:  01/27/2020/3:56:42 PM    Final    VAS [place] LOWER EXTREMITY VENOUS (DVT)  Result Date: 01/28/2020  Lower Venous DVTStudy Indications: Edema, and pulmonary embolism.  Limitations: Body habitus and poor ultrasound/tissue interface. Comparison Study: No prior study Performing Technologist: Maudry Mayhew MHA, RDMS, RVT, RDCS  Examination Guidelines: A complete evaluation includes B-mode imaging, spectral Doppler, color Doppler, and power Doppler as needed of all accessible portions of each vessel. Bilateral testing is considered an integral part of a complete examination. Limited examinations for reoccurring indications may be performed as noted. The reflux portion of the exam is performed with the patient in reverse Trendelenburg.  +---------+-------------------+---------+-----------+-------------+------------+ RIGHT    Compressibility    PhasicitySpontaneityProperties   Thrombus                                                                  Aging        +---------+-------------------+---------+-----------+-------------+------------+  CFV      Full               Yes      Yes                                  +---------+-------------------+---------+-----------+-------------+------------+ SFJ      Full                                                             +---------+-------------------+---------+-----------+-------------+------------+ FV Prox  Full                                                             +---------+-------------------+---------+-----------+-------------+------------+ FV Mid   Full                                                             +---------+-------------------+---------+-----------+-------------+------------+ FV DistalUnable to perform           Yes        Patent by                          compression                            color Doppler                      maneuver due to                                                            patient discomfort                                               +---------+-------------------+---------+-----------+-------------+------------+ POP      Full               Yes      Yes                                  +---------+-------------------+---------+-----------+-------------+------------+ PTV      Full                        Yes                                  +---------+-------------------+---------+-----------+-------------+------------+ PERO  Full                        Yes                                  +---------+-------------------+---------+-----------+-------------+------------+   +---------+---------------+---------+-----------+----------+--------------+ LEFT     CompressibilityPhasicitySpontaneityPropertiesThrombus Aging +---------+---------------+---------+-----------+----------+--------------+ CFV      Full           Yes      Yes                                 +---------+---------------+---------+-----------+----------+--------------+ SFJ      Full                                                        +---------+---------------+---------+-----------+----------+--------------+ FV Prox  Full                                                        +---------+---------------+---------+-----------+----------+--------------+ FV Mid   Full                                                        +---------+---------------+---------+-----------+----------+--------------+ FV DistalFull                                                        +---------+---------------+---------+-----------+----------+--------------+ PFV      Full                                                        +---------+---------------+---------+-----------+----------+--------------+ POP      Full           Yes      Yes                                  +---------+---------------+---------+-----------+----------+--------------+ PTV      Full                                                        +---------+---------------+---------+-----------+----------+--------------+ PERO     Full                                                        +---------+---------------+---------+-----------+----------+--------------+  Summary: RIGHT: - There is no evidence of deep vein thrombosis in the lower extremity. However, portions of this examination were limited- see technologist comments above.  - No cystic structure found in the popliteal fossa.  LEFT: - There is no evidence of deep vein thrombosis in the lower extremity. However, portions of this examination were limited- see technologist comments above.  - No cystic structure found in the popliteal fossa.  *See table(s) above for measurements and observations. Electronically signed by Deitra Mayo MD on 01/28/2020 at 6:19:46 PM.    Final     ASSESSMENT AND PLAN: 1.  Submassive bilateral pulmonary embolism with right heart strain 2.  Right colon cancer, pT3 N2 aM0 stage IIb, MSS 3.  Iron deficiency anemia 4.  History of left breast DCIS 5.  Hypertension 6.  Diabetes mellitus 7.  Arthritis 8.  GERD  -CTA showed PE with right heart strain.  She also has bilateral lower extremity edema and Doppler ultrasound was negative for DVT bilaterally.  Heparin has been stopped and she has been transitioned to Eliquis.  Recommend continuation of Eliquis upon discharge. -Chemotherapy has been placed on hold.  She is already scheduled for outpatient follow-up on 02/11/2020 and she was advised to keep this appointment. -She has mild anemia secondary to iron deficiency as well as recent chemotherapy.  Hemoglobin overall stable.  Monitor for now. -Continue management of chronic medical conditions per hospitalist.  From our standpoint, the patient may be discharged home and continue on Eliquis.  She is  already scheduled for outpatient follow-up in our clinic.   LOS: 3 days   Mikey Bussing, DNP, AGPCNP-BC, AOCNP 01/30/20  Addendum  I have seen the patient, examined her. I agree with the assessment and and plan and have edited the notes.   Pt is ready to go home today, with Eliquiis which her insurance will cover better. I am glad she does not need home oxygen. She has mild skin erythema at port side, she will continue Kflex for that and UTI after discharge.   Truitt Merle  01/30/2020   Plan to see her back in my clinic on 10/12.

## 2020-01-30 NOTE — TOC Benefit Eligibility Note (Signed)
Transition of Care [facility]) Benefit Eligibility Note    Patient Details  Name: Kim Castro MRN: [id number] Date of Birth: 1945/05/11   Medication/Dose: Arne [place]  5 MG BID  Covered?: Yes  Tier: 3 Drug (PREFERRED)  Prescription Coverage Preferred Pharmacy: [facility] [place] with Person/Company/Phone Number:: EMILY  @ [employer]  RX # [phone]  Co-Pay: $37.00  Prior Approval: No  Deductible: Met       Memory Argue Phone Number: 01/30/2020, 2:30 PM

## 2020-01-30 NOTE — Discharge Instructions (Signed)
Information on my medicine - ELIQUIS (apixaban)  This medication education was reviewed with me or my healthcare representative as part of my discharge preparation.  The pharmacist that spoke with me during my hospital stay was:  Shamon Lobo A, RPH  Why was Eliquis prescribed for you? Eliquis was prescribed to treat blood clots that may have been found in the veins of your legs (deep vein thrombosis) or in your lungs (pulmonary embolism) and to reduce the risk of them occurring again.  What do You need to know about Eliquis ? The starting dose is 10 mg (two 5 mg tablets) taken TWICE daily for the FIRST SEVEN (7) DAYS, then on (enter date)  02/05/20  the dose is reduced to ONE 5 mg tablet taken TWICE daily.  Eliquis may be taken with or without food.   Try to take the dose about the same time in the morning and in the evening. If you have difficulty swallowing the tablet whole please discuss with your pharmacist how to take the medication safely.  Take Eliquis exactly as prescribed and DO NOT stop taking Eliquis without talking to the doctor who prescribed the medication.  Stopping may increase your risk of developing a new blood clot.  Refill your prescription before you run out.  After discharge, you should have regular check-up appointments with your healthcare provider that is prescribing your Eliquis.    What do you do if you miss a dose? If a dose of ELIQUIS is not taken at the scheduled time, take it as soon as possible on the same day and twice-daily administration should be resumed. The dose should not be doubled to make up for a missed dose.  Important Safety Information A possible side effect of Eliquis is bleeding. You should call your healthcare provider right away if you experience any of the following: ? Bleeding from an injury or your nose that does not stop. ? Unusual colored urine (red or dark brown) or unusual colored stools (red or black). ? Unusual bruising for  unknown reasons. ? A serious fall or if you hit your head (even if there is no bleeding).  Some medicines may interact with Eliquis and might increase your risk of bleeding or clotting while on Eliquis. To help avoid this, consult your healthcare provider or pharmacist prior to using any new prescription or non-prescription medications, including herbals, vitamins, non-steroidal anti-inflammatory drugs (NSAIDs) and supplements.  This website has more information on Eliquis (apixaban): http://www.eliquis.com/eliquis/home

## 2020-01-31 LAB — URINE CULTURE: Culture: NO GROWTH

## 2020-02-04 ENCOUNTER — Encounter: Payer: Self-pay | Admitting: Hematology

## 2020-02-07 NOTE — Progress Notes (Signed)
[facility]   [phone] [phone]   Clinic Follow up Note   Patient Care Team: Hulan Fess, MD as PCP - General (Family Medicine) Excell Seltzer, MD (Inactive) as Consulting Physician (General Surgery) Rolm Bookbinder, MD as Consulting Physician (General Surgery) Thea Silversmith, MD as Consulting Physician (Radiation Oncology) Holley Bouche, NP (Inactive) as Nurse Practitioner (Nurse Practitioner) Truitt Merle, MD as Consulting Physician (Hematology) Jonnie Finner, RN as Oncology Nurse Navigator Leighton Ruff, MD as Consulting Physician (General Surgery) Nicholas Lose, MD as Consulting Physician (Hematology and Oncology) Leighton Ruff, MD as Consulting Physician (General Surgery)  Date of Service:  02/11/2020  CHIEF COMPLAINT: F/u of colon cancer, B/l PE  SUMMARY OF ONCOLOGIC HISTORY: Oncology History Overview Note  Cancer Staging Breast cancer of upper-outer quadrant of left female breast [facility]) Staging form: Breast, AJCC 7th Edition - Clinical stage from 06/11/2014: Stage 0 (Tis (DCIS), N0, M0) - Unsigned - Pathologic stage from 07/03/2014: Stage Unknown (Tis (DCIS), NX, cM0) - Signed by Enid Cutter, MD on 07/10/2014 Staging comments: Staged on final lumpectomy specimen by Dr. Donato Heinz.  right colon cancer Staging form: Colon and Rectum, AJCC 8th Edition - Pathologic stage from 11/01/2019: Stage IIIB (pT3, pN2a, cM0) - Signed by Truitt Merle, MD on 11/29/2019    Malignant neoplasm of female breast ([place])  05/29/2014 Initial Biopsy   Left breast needle core biopsy: Grade 2, DCIS with calcs. ER+ (100%), PR+ (96%).    06/04/2014 Initial Diagnosis   Left breast DCIS with calcifications, ER 100%, PR 96%   06/10/2014 Breast MRI   Left breast: 2.4 x 1.3 x 1.1 cm area of patchy non-mass enhancement upper outer quadrant includes postbiopsy seroma; Right breast: 1.2 cm previously biopsied stable benign fibroadenoma   06/12/2014 Procedure    Genetic counseling/testing: Identified 1 VUS on CHEK2 gene. Remainder of 17 gene panel tested negative and included: ATM, BARD1, BRCA 1/2, BRIP1, CDH1, CHEK2, EPCAM, MLH1, MSH2, MSH6, NBN, NF1, PALB2, PTEN, RAD50, RAD51C, RAD51D, STK11, and TP53.    07/01/2014 Surgery   Left breast lumpectomy ([facility]): Grade 1, DCIS, spanning 2.3 cm, 1 mm margin, ER 100%, PR 96%   07/31/2014 - 08/28/2014 Radiation Therapy   Adjuvant RT completed Pablo Ledger). Left breast: Total dose 42.5 Gy over 17 fractions. Left breast boost: Total dose 7.5 Gy over 3 fractions.    09/14/2014 - 01/13/2020 Anti-estrogen oral therapy   Anastrazole 4m daily. Planned duration of treatment: 5 years ([place]. Completed in 01/2020.    09/25/2014 Survivorship   Survivorship Care Plan given to patient and reviewed with her in person.    right colon cancer  09/19/2019 Imaging   CT AP W contrast 09/19/19  IMPRESSION Fullness in the cecum, cannot exclude a mass. No evidence for metastatic disease is identified.    09/24/2019 Procedure   Colonoscopy by Dr MEber Jones5/25/21 IMPRESSION 1. The colon was redundant  2. Mild diverticulosis was noted through the entire examined colon 3. Single 152mpolyp was found in the ascending colon; polypectomy was performed using snare cautery and biopsy forceps 4. Mild diverticulosis was notes in the descending colon and sigmoid colon.  5. Single polyp was found in the sigmoid colon, polypectomy was performed with cold forceps.  6. Single polyp was found in the rectosigmoid colon; polypectomy was performed with cold snare  7. Small internal hemorrhoids  8. Large mass was found at the cecum; multiple biopsies of the area were performed using cold forceps; injection (tattooing) was performed distal to  the mass.    09/24/2019 Initial Biopsy   INTERPRETATION AND DIAGNOSIS:  A. Cecum, biopsy:  Invasive moderately differentiated adenocarcinoma.  see comment  B. Polyp @ ascending colon, polypectomy:  Tubular  Adenoma  C. Polyp @ sigmoid colon Polypectomy:  hyperplastic polyp.  D. Polyp @ rectosigmoid colon, Polypectomy:  Hyperplastic Polyp      10/16/2019 Imaging   CT Chest IMPRESSION: 1. Multiple small pulmonary nodules measuring 5 mm or less in size in the lungs. These are nonspecific and are typically considered statistically likely benign. However, given the patient's history of primary malignancy, close attention on follow-up studies is recommended to ensure stability. 2. Aortic atherosclerosis, in addition to right coronary artery disease. Assessment for potential risk factor modification, dietary therapy or pharmacologic therapy may be warranted, if clinically indicated. 3. There are calcifications of the aortic valve and mitral annulus. Echocardiographic correlation for evaluation of potential valvular dysfunction may be warranted if clinically indicated. 4. Small hiatal hernia.   Aortic Atherosclerosis (ICD10-I70.0).   11/01/2019 Initial Diagnosis   Colon cancer ([place])   11/01/2019 Surgery   LAPAROSCOPIC PARTIAL COLECTOMY by Dr Marcello Moores and Dr Johney Maine   11/01/2019 Pathology Results   FINAL MICROSCOPIC DIAGNOSIS:   A. COLON, PROXIMAL RIGHT, COLECTOMY:  - Invasive colonic adenocarcinoma, 5 cm.  - Tumor invades through the muscularis propria into pericolonic tissues.   - Margins of resection are not involved.  - Metastatic carcinoma in (5) of (13) lymph nodes.  - See oncology table.    MSI Stable  Mismatch repair normal  MLH1 - Preserved nuclear expression (greater 50% tumor expression) MSH2 - Preserved nuclear expression (greater 50% tumor expression) MSH6 - Preserved nuclear expression (greater 50% tumor expression) PMS2 - Preserved nuclear expression (greater 50% tumor expression)   11/01/2019 Cancer Staging   Staging form: Colon and Rectum, AJCC 8th Edition - Pathologic stage from 11/01/2019: Stage IIIB (pT3, pN2a, cM0) - Signed by Truitt Merle, MD on 11/29/2019   12/10/2019  Procedure   PAC placed 12/10/19   12/17/2019 -  Chemotherapy   FOLFOX q2weeks starting in 2 weeks starting 12/17/19      CURRENT THERAPY:  FOLFOX q2weeks starting in 2 weeksstarting 12/17/19. Held 01/27/20-02/10/20 due to b/l PE.   INTERVAL HISTORY:  Kim Castro is here for a follow up. She presents to the clinic alone. She notes she is doing better and breathing is back to baseline. She notes Eliquis is $75 a month copay. She is willing to continue to pay as long as she does not have to do on Eliquis long term. She is overall ready to restart chemo.     REVIEW OF SYSTEMS:   Constitutional: Denies fevers, chills or abnormal weight loss Eyes: Denies blurriness of vision Ears, nose, mouth, throat, and face: Denies mucositis or sore throat Respiratory: Denies cough, dyspnea or wheezes Cardiovascular: Denies palpitation, chest discomfort or lower extremity swelling Gastrointestinal:  Denies nausea, heartburn or change in bowel habits Skin: Denies abnormal skin rashes Lymphatics: Denies new lymphadenopathy or easy bruising Neurological:Denies numbness, tingling or new weaknesses Behavioral/Psych: Mood is stable, no new changes  All other systems were reviewed with the patient and are negative.  MEDICAL HISTORY:  Past Medical History:  Diagnosis Date  . Aortic atherosclerosis ([place])   . Arthritis    feet, lower back  . Basal cell carcinoma    arm  . Breast cancer of upper-outer quadrant of left female breast ([place]) 06/04/2014  . Cataract    immature on the  left  . Colon cancer ([place]) 08/2019  . Diabetes mellitus without complication ([place])   . Diverticulosis   . Dizziness    > 73yr ago;took Antivert   . Family history of anesthesia complication    sister slow to wake up with anesthesia  . Family history of breast cancer   . Family history of colon cancer   . Family history of uterine cancer   . GERD (gastroesophageal reflux disease)    takes occasional TUMs  . History of  bronchitis    > 89[place]  . History of colon polyps   . History of hiatal hernia    Small noted on CT  . Hypertension    takes Losartan daily and HCTZ  . Iron deficiency anemia   . Joint pain   . Numbness    to toes on each foot  . Peripheral neuropathy    feet and toes  . Personal history of radiation therapy   . Pulmonary nodules    Noted on CT  . Radiation 07/31/14-08/28/14   Left Breast 20 fxs  . Seasonal allergies    takes Claritin prn  . Urinary frequency   . Vitamin D deficiency    takes VIt D daily    SURGICAL HISTORY: Past Surgical History:  Procedure Laterality Date  . BREAST BIOPSY Bilateral   . BREAST LUMPECTOMY Left   . BREAST LUMPECTOMY WITH RADIOACTIVE SEED LOCALIZATION Left 07/01/2014   Procedure: LEFT BREAST LUMPECTOMY WITH RADIOACTIVE SEED LOCALIZATION;  Surgeon: BeExcell SeltzerMD;  [facility] Service: General;  Laterality: Left;  . CATARACT EXTRACTION Right   . COLONOSCOPY    . LAPAROSCOPIC PARTIAL COLECTOMY N/A 11/01/2019   Procedure: LAPAROSCOPIC PARTIAL COLECTOMY;  Surgeon: ThLeighton RuffMD;  Location: [facility];  Service: General;  Laterality: N/A;  . PORTACATH PLACEMENT N/A 12/10/2019   Procedure: INSERTION PORT-A-CATH ULTRASOUND GUIDED IN RIGHT IJ;  Surgeon: ThLeighton RuffMD;  Location: [facility];  Service: General;  Laterality: N/A;  . TOTAL KNEE ARTHROPLASTY Left 10/24/2012   Procedure: TOTAL KNEE ARTHROPLASTY;  Surgeon: FrKerin SalenMD;  [facility] Service: Orthopedics;  Laterality: Left;  . TOTAL KNEE ARTHROPLASTY Right 01/09/2013   Procedure: TOTAL KNEE ARTHROPLASTY;  Surgeon: FrKerin SalenMD;  [facility] Service: Orthopedics;  Laterality: Right;  . TUBAL LIGATION      I have reviewed the social history and family history with the patient and they are unchanged from previous note.  ALLERGIES:  is allergic to oxycodone.  MEDICATIONS:  Current Outpatient Medications  Medication Sig Dispense Refill   . acetaminophen (TYLENOL) 500 MG tablet Take 2 tablets (1,000 mg total) by mouth every 6 (six) hours as needed. (Patient not taking: Reported on 12/03/2019) 30 tablet 0  . apixaban (ELIQUIS) 5 MG TABS tablet Take 2 tablets (10 mg total) by mouth 2 (two) times daily. Take 2 tablets (1066m2 times daily x 7 days, then 1 tablet (5mg20m times daily 70 tablet 2  . Cholecalciferol (VITAMIN D) 2000 UNITS CAPS Take 2,000 Units by mouth daily.     . Ferrous Sulfate (SLOW FE PO) Take 1 tablet by mouth in the morning and at bedtime.     . furosemide (LASIX) 20 MG tablet Take 1 tablet (20 mg total) by mouth daily. 30 tablet 0  . ibuprofen (ADVIL) 200 MG tablet Take 400 mg by mouth every 6 (six) hours as needed for moderate pain.     .Marland Kitchen  lidocaine-prilocaine (EMLA) cream Apply to affected area once (Patient taking differently: Apply 1 application topically daily as needed (port access). ) 30 g 3  . loratadine (CLARITIN) 10 MG tablet Take 10 mg by mouth daily as needed for allergies.    Marland Kitchen losartan (COZAAR) 25 MG tablet Take 25 mg by mouth daily.    . metFORMIN (GLUCOPHAGE) 500 MG tablet Take 1 tablet (500 mg total) by mouth daily with breakfast. (Patient taking differently: Take 500 mg by mouth in the morning and at bedtime. )    . Multiple Vitamins-Minerals (MULTIVITAMIN WITH MINERALS) tablet Take 1 tablet by mouth daily.    Marland Kitchen omeprazole (PRILOSEC) 20 MG capsule Take 20 mg by mouth daily before breakfast.     . ondansetron (ZOFRAN) 8 MG tablet Take 1 tablet (8 mg total) by mouth 2 (two) times daily as needed for refractory nausea / vomiting. Start on day 3 after chemotherapy. 30 tablet 1  . potassium chloride (KLOR-CON) 10 MEQ tablet Take 1 tablet (10 mEq total) by mouth daily. 30 tablet 0  . pravastatin (PRAVACHOL) 10 MG tablet Take 10 mg by mouth daily.    . prochlorperazine (COMPAZINE) 10 MG tablet Take 1 tablet (10 mg total) by mouth every 6 (six) hours as needed (Nausea or vomiting). 30 tablet 1   No current  facility-administered medications for this visit.    PHYSICAL EXAMINATION: ECOG PERFORMANCE STATUS: 1 - Symptomatic but completely ambulatory  Vitals:   02/11/20 1137  BP: (!) 133/59  Pulse: 83  Resp: 18  Temp: (!) 97.5 F (36.4 C)  SpO2: 98%   Filed Weights   02/11/20 1137  Weight: 237 lb 9.6 oz (107.8 kg)    Due to COVID19 we will limit examination to appearance. Patient had no complaints.  GENERAL:alert, no distress and comfortable SKIN: skin color normal, no rashes or significant lesions EYES: normal, Conjunctiva are pink and non-injected, sclera clear  NEURO: alert & oriented x 3 with fluent speech    LABORATORY DATA:  I have reviewed the data as listed CBC Latest Ref Rng & Units 02/11/2020 01/30/2020 01/29/2020  WBC 4.0 - 10.5 K/uL 8.2 12.8(H) 13.4(H)  Hemoglobin 12.0 - 15.0 g/dL 11.4(L) 11.4(L) 11.2(L)  Hematocrit 36 - 46 % 35.3(L) 36.4 35.4(L)  Platelets 150 - 400 K/uL 288 193 180     CMP Latest Ref Rng & Units 02/11/2020 01/30/2020 01/29/2020  Glucose 70 - 99 mg/dL 160(H) 148(H) 144(H)  BUN 8 - 23 mg/dL _0 Creatinine 0.44 - 1.00 mg/dL 0.83 0.80 0.82  Sodium 135 - 145 mmol/L 141 142 142  Potassium 3.5 - 5.1 mmol/L 3.8 4.0 4.0  Chloride 98 - 111 mmol/L 106 104 104  CO2 22 - 32 mmol/L _1 Calcium 8.9 - 10.3 mg/dL 9.7 9.2 9.0  Total Protein 6.5 - 8.1 g/dL 7.5 - -  Total Bilirubin 0.3 - 1.2 mg/dL 0.8 - -  Alkaline Phos 38 - 126 U/L 121 - -  AST 15 - 41 U/L 35 - -  ALT 0 - 44 U/L 29 - -      RADIOGRAPHIC STUDIES: I have personally reviewed the radiological images as listed and agreed with the findings in the report. No results found.   ASSESSMENT & PLAN:  Kim Castro is a 74 y.o. female with    1.Right colon cancer, pT3N2aM0 stage IIB, MSS -She was diagnosed in 08/2019 withcecal mass biopsy showed moderately differentiated adenocarcinoma. CT chest from 10/16/19 did  shows multiple small lung nodules that were nonspecific but likely  benign. Will monitor.  -She underwent colon surgery with Dr Marcello Moores on 11/01/19. Path showed overall Stage IIIB cancer. -I started her onadjuvant chemotherapy for 6 months to reduce her risk of recurrencewith FOLFOX q2weeks beginning 12/17/19.Based on neuropathy, Icanswitch her 5FU to Oral Xeloda single agent. -FOLFOX held 01/27/20-02/10/20 due to b/l PE. I discussed this was likely related to her chemo.  -she hs recovered well, will restart chemo. Labs reviewed and adequate to proceed with C4 FOLFOX today F/u in 2 weeks  2.Iron deficient anemia -Her 10/03/19 Iron panel showed, Ferritin 13, Iron 34, sat ratios 8. Overall consistent with iron deficiency. -She did require blood transfusion on 11/06/19 after colon surgery. -Currently on2 tabs Slow Fe iron daily -Anemia mild and stable lately.   3.H/o left breast DCIS, G2, ER/PR+ -Dx in 05/2014. Treated with left lumpectomy with Dr Excell Seltzer, adjuvant RT with Dr Pablo Ledger. She has been on Anastrozole 08/2014-01/13/20. She was previously under the care of Dr Lindi Adie.  -Bone density 06/09/2015 T score +1.1 normal. Plan to repeat DEXA in 2021. -I will take over her breast cancer care given she is on long term surveillance.  4. Submassive bilateral pulmonary embolism, LE Edema  -S/p C3 chemo, her 01/27/20 CTA showed PE with right heart strain. She also has bilateral lower extremity edema and Doppler ultrasound was negative for DVT bilaterally. She was treated with Heparin and she has been transitioned to Eliquis on discharge.  -Per pt her mother had provoked blood clot before. -She notes $75 copay with Eliquis but can manage for now.  -I discussed given her blood clots were provoked plan to stop Eliquis after 6 months of treatment. If she has recurrent blood clot in future, I would recommend continuing anticoagulation indefinitely -She has been on Lasix and oral potassium for LE Edema. If her LE improves or resolves she can reduce or stop lasix and  potassium, then use as needed.   5. Comorbidities: Arthritis, DM, HTN, GERD -f/u with PCP Dr. Rex Kras   PLAN: -Labs reviewed and adequate to proceed with C4 FOLFOX today  -Lab, flush, f/u and FOLFOX in 2, 4, 6 weeks  -Continue Eliquis    No problem-specific Assessment & Plan notes found for this encounter.   No orders of the defined types were placed in this encounter.  All questions were answered. The patient knows to call the clinic with any problems, questions or concerns. No barriers to learning was detected. The total time spent in the appointment was 30 minutes.     Truitt Merle, MD 02/11/2020   I, Joslyn Devon, am acting as scribe for Truitt Merle, MD.   I have reviewed the above documentation for accuracy and completeness, and I agree with the above.

## 2020-02-11 ENCOUNTER — Telehealth: Payer: Self-pay | Admitting: Hematology

## 2020-02-11 ENCOUNTER — Inpatient Hospital Stay (HOSPITAL_BASED_OUTPATIENT_CLINIC_OR_DEPARTMENT_OTHER): Payer: Medicare Other | Admitting: Hematology

## 2020-02-11 ENCOUNTER — Inpatient Hospital Stay: Payer: Medicare Other

## 2020-02-11 ENCOUNTER — Encounter: Payer: Self-pay | Admitting: Hematology

## 2020-02-11 ENCOUNTER — Inpatient Hospital Stay: Payer: Medicare Other | Attending: Hematology

## 2020-02-11 ENCOUNTER — Other Ambulatory Visit: Payer: Self-pay

## 2020-02-11 VITALS — BP 133/59 | HR 83 | Temp 97.5°F | Resp 18 | Ht 66.0 in | Wt 237.6 lb

## 2020-02-11 DIAGNOSIS — Z170 Estrogen receptor positive status [ER+]: Secondary | ICD-10-CM | POA: Diagnosis not present

## 2020-02-11 DIAGNOSIS — D509 Iron deficiency anemia, unspecified: Secondary | ICD-10-CM | POA: Insufficient documentation

## 2020-02-11 DIAGNOSIS — C50919 Malignant neoplasm of unspecified site of unspecified female breast: Secondary | ICD-10-CM | POA: Diagnosis not present

## 2020-02-11 DIAGNOSIS — C182 Malignant neoplasm of ascending colon: Secondary | ICD-10-CM | POA: Diagnosis not present

## 2020-02-11 DIAGNOSIS — I10 Essential (primary) hypertension: Secondary | ICD-10-CM | POA: Insufficient documentation

## 2020-02-11 DIAGNOSIS — Z79899 Other long term (current) drug therapy: Secondary | ICD-10-CM | POA: Insufficient documentation

## 2020-02-11 DIAGNOSIS — Z7982 Long term (current) use of aspirin: Secondary | ICD-10-CM | POA: Diagnosis not present

## 2020-02-11 DIAGNOSIS — Z5189 Encounter for other specified aftercare: Secondary | ICD-10-CM | POA: Insufficient documentation

## 2020-02-11 DIAGNOSIS — Z923 Personal history of irradiation: Secondary | ICD-10-CM | POA: Insufficient documentation

## 2020-02-11 DIAGNOSIS — Z86711 Personal history of pulmonary embolism: Secondary | ICD-10-CM | POA: Insufficient documentation

## 2020-02-11 DIAGNOSIS — E119 Type 2 diabetes mellitus without complications: Secondary | ICD-10-CM | POA: Insufficient documentation

## 2020-02-11 DIAGNOSIS — Z853 Personal history of malignant neoplasm of breast: Secondary | ICD-10-CM | POA: Insufficient documentation

## 2020-02-11 DIAGNOSIS — Z5111 Encounter for antineoplastic chemotherapy: Secondary | ICD-10-CM | POA: Insufficient documentation

## 2020-02-11 DIAGNOSIS — Z7901 Long term (current) use of anticoagulants: Secondary | ICD-10-CM | POA: Insufficient documentation

## 2020-02-11 DIAGNOSIS — Z95828 Presence of other vascular implants and grafts: Secondary | ICD-10-CM

## 2020-02-11 LAB — CBC WITH DIFFERENTIAL (CANCER CENTER ONLY)
Abs Immature Granulocytes: 0.04 10*3/uL (ref 0.00–0.07)
Basophils Absolute: 0.1 10*3/uL (ref 0.0–0.1)
Basophils Relative: 1 %
Eosinophils Absolute: 0.2 10*3/uL (ref 0.0–0.5)
Eosinophils Relative: 2 %
HCT: 35.3 % — ABNORMAL LOW (ref 36.0–46.0)
Hemoglobin: 11.4 g/dL — ABNORMAL LOW (ref 12.0–15.0)
Immature Granulocytes: 1 %
Lymphocytes Relative: 36 %
Lymphs Abs: 2.9 10*3/uL (ref 0.7–4.0)
MCH: 31 pg (ref 26.0–34.0)
MCHC: 32.3 g/dL (ref 30.0–36.0)
MCV: 95.9 fL (ref 80.0–100.0)
Monocytes Absolute: 0.7 10*3/uL (ref 0.1–1.0)
Monocytes Relative: 8 %
Neutro Abs: 4.3 10*3/uL (ref 1.7–7.7)
Neutrophils Relative %: 52 %
Platelet Count: 288 10*3/uL (ref 150–400)
RBC: 3.68 MIL/uL — ABNORMAL LOW (ref 3.87–5.11)
RDW: 17.7 % — ABNORMAL HIGH (ref 11.5–15.5)
WBC Count: 8.2 10*3/uL (ref 4.0–10.5)
nRBC: 0 % (ref 0.0–0.2)

## 2020-02-11 LAB — CMP (CANCER CENTER ONLY)
ALT: 29 U/L (ref 0–44)
AST: 35 U/L (ref 15–41)
Albumin: 3.7 g/dL (ref 3.5–5.0)
Alkaline Phosphatase: 121 U/L (ref 38–126)
Anion gap: 11 (ref 5–15)
BUN: 22 mg/dL (ref 8–23)
CO2: 24 mmol/L (ref 22–32)
Calcium: 9.7 mg/dL (ref 8.9–10.3)
Chloride: 106 mmol/L (ref 98–111)
Creatinine: 0.83 mg/dL (ref 0.44–1.00)
GFR, Estimated: >60 mL/min (ref >60)
Glucose, Bld: 160 mg/dL — ABNORMAL HIGH (ref 70–99)
Potassium: 3.8 mmol/L (ref 3.5–5.1)
Sodium: 141 mmol/L (ref 135–145)
Total Bilirubin: 0.8 mg/dL (ref 0.3–1.2)
Total Protein: 7.5 g/dL (ref 6.5–8.1)

## 2020-02-11 LAB — FERRITIN: Ferritin: 140 ng/mL (ref 11–307)

## 2020-02-11 LAB — IRON AND TIBC
Iron: 82 ug/dL (ref 41–142)
Saturation Ratios: 20 % — ABNORMAL LOW (ref 21–57)
TIBC: 415 ug/dL (ref 236–444)
UIBC: 333 ug/dL (ref 120–384)

## 2020-02-11 MED ORDER — OXALIPLATIN CHEMO INJECTION 100 MG/20ML
85.0000 mg/m2 | Freq: Once | INTRAVENOUS | Status: AC
  Administered 2020-02-11: 190 mg via INTRAVENOUS
  Filled 2020-02-11: qty 38

## 2020-02-11 MED ORDER — FLUOROURACIL CHEMO INJECTION 2.5 GM/50ML
400.0000 mg/m2 | Freq: Once | INTRAVENOUS | Status: AC
  Administered 2020-02-11: 900 mg via INTRAVENOUS
  Filled 2020-02-11: qty 18

## 2020-02-11 MED ORDER — LEUCOVORIN CALCIUM INJECTION 350 MG
400.0000 mg/m2 | Freq: Once | INTRAVENOUS | Status: AC
  Administered 2020-02-11: 904 mg via INTRAVENOUS
  Filled 2020-02-11: qty 45.2

## 2020-02-11 MED ORDER — DEXTROSE 5 % IV SOLN
Freq: Once | INTRAVENOUS | Status: AC
  Filled 2020-02-11: qty 250

## 2020-02-11 MED ORDER — SODIUM CHLORIDE 0.9% FLUSH
10.0000 mL | INTRAVENOUS | Status: DC | PRN
  Administered 2020-02-11: 10 mL
  Filled 2020-02-11: qty 10

## 2020-02-11 MED ORDER — PALONOSETRON HCL INJECTION 0.25 MG/5ML
INTRAVENOUS | Status: AC
  Filled 2020-02-11: qty 5

## 2020-02-11 MED ORDER — SODIUM CHLORIDE 0.9 % IV SOLN
2400.0000 mg/m2 | INTRAVENOUS | Status: DC
  Administered 2020-02-11: 5400 mg via INTRAVENOUS
  Filled 2020-02-11: qty 108

## 2020-02-11 MED ORDER — PALONOSETRON HCL INJECTION 0.25 MG/5ML
0.2500 mg | Freq: Once | INTRAVENOUS | Status: AC
  Administered 2020-02-11: 0.25 mg via INTRAVENOUS

## 2020-02-11 MED ORDER — SODIUM CHLORIDE 0.9 % IV SOLN
10.0000 mg | Freq: Once | INTRAVENOUS | Status: AC
  Administered 2020-02-11: 10 mg via INTRAVENOUS
  Filled 2020-02-11: qty 10
  Filled 2020-02-11: qty 1

## 2020-02-11 NOTE — Progress Notes (Signed)
Full dose oxaliplatin today per Dr Burr Medico

## 2020-02-11 NOTE — Telephone Encounter (Signed)
Scheduled per 10/12 los. Messaged RN tim to give pt appt calendar.

## 2020-02-11 NOTE — Patient Instructions (Signed)
[facility] Discharge Instructions for Patients Receiving Chemotherapy  Today you received the following chemotherapy agents: oxaliplatin, leucovorin, and fluorouracil.  To help prevent nausea and vomiting after your treatment, we encourage you to take your nausea medication as directed.   If you develop nausea and vomiting that is not controlled by your nausea medication, call the clinic.   BELOW ARE SYMPTOMS THAT SHOULD BE REPORTED IMMEDIATELY:  *FEVER GREATER THAN 100.5 F  *CHILLS WITH OR WITHOUT FEVER  NAUSEA AND VOMITING THAT IS NOT CONTROLLED WITH YOUR NAUSEA MEDICATION  *UNUSUAL SHORTNESS OF BREATH  *UNUSUAL BRUISING OR BLEEDING  TENDERNESS IN MOUTH AND THROAT WITH OR WITHOUT PRESENCE OF ULCERS  *URINARY PROBLEMS  *BOWEL PROBLEMS  UNUSUAL RASH Items with * indicate a potential emergency and should be followed up as soon as possible.  Feel free to call the clinic should you have any questions or concerns. The clinic phone number is [phone].  Please show the CHEMO ALERT CARD at check-in to the Emergency Department and triage nurse.   

## 2020-02-12 ENCOUNTER — Other Ambulatory Visit: Payer: Self-pay | Admitting: Hematology

## 2020-02-12 ENCOUNTER — Other Ambulatory Visit: Payer: Self-pay | Admitting: Hematology and Oncology

## 2020-02-12 DIAGNOSIS — Z85038 Personal history of other malignant neoplasm of large intestine: Secondary | ICD-10-CM | POA: Diagnosis not present

## 2020-02-12 DIAGNOSIS — Z86711 Personal history of pulmonary embolism: Secondary | ICD-10-CM | POA: Diagnosis not present

## 2020-02-12 DIAGNOSIS — C50412 Malignant neoplasm of upper-outer quadrant of left female breast: Secondary | ICD-10-CM

## 2020-02-12 DIAGNOSIS — Z87440 Personal history of urinary (tract) infections: Secondary | ICD-10-CM | POA: Diagnosis not present

## 2020-02-13 ENCOUNTER — Other Ambulatory Visit: Payer: Self-pay

## 2020-02-13 ENCOUNTER — Inpatient Hospital Stay: Payer: Medicare Other

## 2020-02-13 VITALS — BP 140/52 | HR 66 | Temp 99.1°F | Resp 18

## 2020-02-13 DIAGNOSIS — Z923 Personal history of irradiation: Secondary | ICD-10-CM | POA: Diagnosis not present

## 2020-02-13 DIAGNOSIS — Z5111 Encounter for antineoplastic chemotherapy: Secondary | ICD-10-CM | POA: Diagnosis not present

## 2020-02-13 DIAGNOSIS — Z7901 Long term (current) use of anticoagulants: Secondary | ICD-10-CM | POA: Diagnosis not present

## 2020-02-13 DIAGNOSIS — Z5189 Encounter for other specified aftercare: Secondary | ICD-10-CM | POA: Diagnosis not present

## 2020-02-13 DIAGNOSIS — C182 Malignant neoplasm of ascending colon: Secondary | ICD-10-CM | POA: Diagnosis not present

## 2020-02-13 DIAGNOSIS — Z79899 Other long term (current) drug therapy: Secondary | ICD-10-CM | POA: Diagnosis not present

## 2020-02-13 DIAGNOSIS — Z853 Personal history of malignant neoplasm of breast: Secondary | ICD-10-CM | POA: Diagnosis not present

## 2020-02-13 DIAGNOSIS — E119 Type 2 diabetes mellitus without complications: Secondary | ICD-10-CM | POA: Diagnosis not present

## 2020-02-13 DIAGNOSIS — D509 Iron deficiency anemia, unspecified: Secondary | ICD-10-CM | POA: Diagnosis not present

## 2020-02-13 DIAGNOSIS — I10 Essential (primary) hypertension: Secondary | ICD-10-CM | POA: Diagnosis not present

## 2020-02-13 DIAGNOSIS — Z86711 Personal history of pulmonary embolism: Secondary | ICD-10-CM | POA: Diagnosis not present

## 2020-02-13 DIAGNOSIS — Z7982 Long term (current) use of aspirin: Secondary | ICD-10-CM | POA: Diagnosis not present

## 2020-02-13 MED ORDER — PEGFILGRASTIM-CBQV 6 MG/0.6ML ~~LOC~~ SOSY
6.0000 mg | PREFILLED_SYRINGE | Freq: Once | SUBCUTANEOUS | Status: AC
  Administered 2020-02-13: 6 mg via SUBCUTANEOUS

## 2020-02-13 MED ORDER — SODIUM CHLORIDE 0.9% FLUSH
10.0000 mL | INTRAVENOUS | Status: DC | PRN
  Administered 2020-02-13: 10 mL
  Filled 2020-02-13: qty 10

## 2020-02-13 MED ORDER — PEGFILGRASTIM-CBQV 6 MG/0.6ML ~~LOC~~ SOSY
PREFILLED_SYRINGE | SUBCUTANEOUS | Status: AC
  Filled 2020-02-13: qty 0.6

## 2020-02-13 MED ORDER — HEPARIN SOD (PORK) LOCK FLUSH 100 UNIT/ML IV SOLN
500.0000 [IU] | Freq: Once | INTRAVENOUS | Status: AC | PRN
  Administered 2020-02-13: 500 [IU]
  Filled 2020-02-13: qty 5

## 2020-02-13 NOTE — Patient Instructions (Signed)
Implanted Port Home Guide An implanted port is a device that is placed under the skin. It is usually placed in the chest. The device can be used to give IV medicine, to take blood, or for dialysis. You may have an implanted port if:  You need IV medicine that would be irritating to the small veins in your hands or arms.  You need IV medicines, such as antibiotics, for a long period of time.  You need IV nutrition for a long period of time.  You need dialysis. Having a port means that your health care provider will not need to use the veins in your arms for these procedures. You may have fewer limitations when using a port than you would if you used other types of long-term IVs, and you will likely be able to return to normal activities after your incision heals. An implanted port has two main parts:  Reservoir. The reservoir is the part where a needle is inserted to give medicines or draw blood. The reservoir is round. After it is placed, it appears as a small, raised area under your skin.  Catheter. The catheter is a thin, flexible tube that connects the reservoir to a vein. Medicine that is inserted into the reservoir goes into the catheter and then into the vein. How is my port accessed? To access your port:  A numbing cream may be placed on the skin over the port site.  Your health care provider will put on a mask and sterile gloves.  The skin over your port will be cleaned carefully with a germ-killing soap and allowed to dry.  Your health care provider will gently pinch the port and insert a needle into it.  Your health care provider will check for a blood return to make sure the port is in the vein and is not clogged.  If your port needs to remain accessed to get medicine continuously (constant infusion), your health care provider will place a clear bandage (dressing) over the needle site. The dressing and needle will need to be changed every week, or as told by your health care  provider. What is flushing? Flushing helps keep the port from getting clogged. Follow instructions from your health care provider about how and when to flush the port. Ports are usually flushed with saline solution or a medicine called heparin. The need for flushing will depend on how the port is used:  If the port is only used from time to time to give medicines or draw blood, the port may need to be flushed: ? Before and after medicines have been given. ? Before and after blood has been drawn. ? As part of routine maintenance. Flushing may be recommended every 4-6 weeks.  If a constant infusion is running, the port may not need to be flushed.  Throw away any syringes in a disposal container that is meant for sharp items (sharps container). You can buy a sharps container from a pharmacy, or you can make one by using an empty hard plastic bottle with a cover. How long will my port stay implanted? The port can stay in for as long as your health care provider thinks it is needed. When it is time for the port to come out, a surgery will be done to remove it. The surgery will be similar to the procedure that was done to put the port in. Follow these instructions at home:   Flush your port as told by your health care provider.    If you need an infusion over several days, follow instructions from your health care provider about how to take care of your port site. Make sure you: ? Wash your hands with soap and water before you change your dressing. If soap and water are not available, use alcohol-based hand sanitizer. ? Change your dressing as told by your health care provider. ? Place any used dressings or infusion bags into a plastic bag. Throw that bag in the trash. ? Keep the dressing that covers the needle clean and dry. Do not get it wet. ? Do not use scissors or sharp objects near the tube. ? Keep the tube clamped, unless it is being used.  Check your port site every day for signs of  infection. Check for: ? Redness, swelling, or pain. ? Fluid or blood. ? Pus or a bad smell.  Protect the skin around the port site. ? Avoid wearing bra straps that rub or irritate the site. ? Protect the skin around your port from seat belts. Place a soft pad over your chest if needed.  Bathe or shower as told by your health care provider. The site may get wet as long as you are not actively receiving an infusion.  Return to your normal activities as told by your health care provider. Ask your health care provider what activities are safe for you.  Carry a medical alert card or wear a medical alert bracelet at all times. This will let health care providers know that you have an implanted port in case of an emergency. Get help right away if:  You have redness, swelling, or pain at the port site.  You have fluid or blood coming from your port site.  You have pus or a bad smell coming from the port site.  You have a fever. Summary  Implanted ports are usually placed in the chest for long-term IV access.  Follow instructions from your health care provider about flushing the port and changing bandages (dressings).  Take care of the area around your port by avoiding clothing that puts pressure on the area, and by watching for signs of infection.  Protect the skin around your port from seat belts. Place a soft pad over your chest if needed.  Get help right away if you have a fever or you have redness, swelling, pain, drainage, or a bad smell at the port site. This information is not intended to replace advice given to you by your health care provider. Make sure you discuss any questions you have with your health care provider. Document Revised: 08/10/2018 Document Reviewed: 05/21/2016 Elsevier Patient Education  2020 [employer] Pegfilgrastim injection What is this medicine? PEGFILGRASTIM (PEG fil gra stim) is a long-acting granulocyte colony-stimulating factor that stimulates the  growth of neutrophils, a type of white blood cell important in the body's fight against infection. It is used to reduce the incidence of fever and infection in patients with certain types of cancer who are receiving chemotherapy that affects the bone marrow, and to increase survival after being exposed to high doses of radiation. This medicine may be used for other purposes; ask your health care provider or pharmacist if you have questions. COMMON BRAND NAME(S): Fulphila, Neulasta, UDENYCA, Ziextenzo What should I tell my health care provider before I take this medicine? They need to know if you have any of these conditions:  kidney disease  latex allergy  ongoing radiation therapy  sickle cell disease  skin reactions to acrylic adhesives (On-Body   Injector only)  an unusual or allergic reaction to pegfilgrastim, filgrastim, other medicines, foods, dyes, or preservatives  pregnant or trying to get pregnant  breast-feeding How should I use this medicine? This medicine is for injection under the skin. If you get this medicine at home, you will be taught how to prepare and give the pre-filled syringe or how to use the On-body Injector. Refer to the patient Instructions for Use for detailed instructions. Use exactly as directed. Tell your healthcare provider immediately if you suspect that the On-body Injector may not have performed as intended or if you suspect the use of the On-body Injector resulted in a missed or partial dose. It is important that you put your used needles and syringes in a special sharps container. Do not put them in a trash can. If you do not have a sharps container, call your pharmacist or healthcare provider to get one. Talk to your pediatrician regarding the use of this medicine in children. While this drug may be prescribed for selected conditions, precautions do apply. Overdosage: If you think you have taken too much of this medicine contact a poison control center or  emergency room at once. NOTE: This medicine is only for you. Do not share this medicine with others. What if I miss a dose? It is important not to miss your dose. Call your doctor or health care professional if you miss your dose. If you miss a dose due to an On-body Injector failure or leakage, a new dose should be administered as soon as possible using a single prefilled syringe for manual use. What may interact with this medicine? Interactions have not been studied. Give your health care provider a list of all the medicines, herbs, non-prescription drugs, or dietary supplements you use. Also tell them if you smoke, drink alcohol, or use illegal drugs. Some items may interact with your medicine. This list may not describe all possible interactions. Give your health care provider a list of all the medicines, herbs, non-prescription drugs, or dietary supplements you use. Also tell them if you smoke, drink alcohol, or use illegal drugs. Some items may interact with your medicine. What should I watch for while using this medicine? You may need blood work done while you are taking this medicine. If you are going to need a MRI, CT scan, or other procedure, tell your doctor that you are using this medicine (On-Body Injector only). What side effects may I notice from receiving this medicine? Side effects that you should report to your doctor or health care professional as soon as possible:  allergic reactions like skin rash, itching or hives, swelling of the face, lips, or tongue  back pain  dizziness  fever  pain, redness, or irritation at site where injected  pinpoint red spots on the skin  red or dark-brown urine  shortness of breath or breathing problems  stomach or side pain, or pain at the shoulder  swelling  tiredness  trouble passing urine or change in the amount of urine Side effects that usually do not require medical attention (report to your doctor or health care  professional if they continue or are bothersome):  bone pain  muscle pain This list may not describe all possible side effects. Call your doctor for medical advice about side effects. You may report side effects to FDA at 1-800-FDA-1088. Where should I keep my medicine? Keep out of the reach of children. If you are using this medicine at home, you will be instructed   on how to store it. Throw away any unused medicine after the expiration date on the label. NOTE: This sheet is a summary. It may not cover all possible information. If you have questions about this medicine, talk to your doctor, pharmacist, or health care provider.  2020 [employer] Standard (2017-07-24 16:57:08)  

## 2020-02-21 NOTE — Progress Notes (Signed)
[name]   [phone] [phone]   Clinic Follow up Note   Patient Care Team: Hulan Fess, MD as PCP - General (Family Medicine) Excell Seltzer, MD (Inactive) as Consulting Physician (General Surgery) Rolm Bookbinder, MD as Consulting Physician (General Surgery) Thea Silversmith, MD as Consulting Physician (Radiation Oncology) Holley Bouche, NP (Inactive) as Nurse Practitioner (Nurse Practitioner) Truitt Merle, MD as Consulting Physician (Hematology) Jonnie Finner, RN as Oncology Nurse Navigator Leighton Ruff, MD as Consulting Physician (General Surgery) Nicholas Lose, MD as Consulting Physician (Hematology and Oncology) Leighton Ruff, MD as Consulting Physician (General Surgery)  Date of Service:  02/24/2020  CHIEF COMPLAINT: F/u of colon cancer, B/l PE  SUMMARY OF ONCOLOGIC HISTORY: Oncology History Overview Note  Cancer Staging Breast cancer of upper-outer quadrant of left female breast [facility]) Staging form: Breast, AJCC 7th Edition - Clinical stage from 06/11/2014: Stage 0 (Tis (DCIS), N0, M0) - Unsigned - Pathologic stage from 07/03/2014: Stage Unknown (Tis (DCIS), NX, cM0) - Signed by Enid Cutter, MD on 07/10/2014 Staging comments: Staged on final lumpectomy specimen by Dr. Donato Heinz.  right colon cancer Staging form: Colon and Rectum, AJCC 8th Edition - Pathologic stage from 11/01/2019: Stage IIIB (pT3, pN2a, cM0) - Signed by Truitt Merle, MD on 11/29/2019    Malignant neoplasm of female breast ([place])  05/29/2014 Initial Biopsy   Left breast needle core biopsy: Grade 2, DCIS with calcs. ER+ (100%), PR+ (96%).    06/04/2014 Initial Diagnosis   Left breast DCIS with calcifications, ER 100%, PR 96%   06/10/2014 Breast MRI   Left breast: 2.4 x 1.3 x 1.1 cm area of patchy non-mass enhancement upper outer quadrant includes postbiopsy seroma; Right breast: 1.2 cm previously biopsied stable benign fibroadenoma   06/12/2014 Procedure    Genetic counseling/testing: Identified 1 VUS on CHEK2 gene. Remainder of 17 gene panel tested negative and included: ATM, BARD1, BRCA 1/2, BRIP1, CDH1, CHEK2, EPCAM, MLH1, MSH2, MSH6, NBN, NF1, PALB2, PTEN, RAD50, RAD51C, RAD51D, STK11, and TP53.    07/01/2014 Surgery   Left breast lumpectomy ([facility]): Grade 1, DCIS, spanning 2.3 cm, 1 mm margin, ER 100%, PR 96%   07/31/2014 - 08/28/2014 Radiation Therapy   Adjuvant RT completed Pablo Ledger). Left breast: Total dose 42.5 Gy over 17 fractions. Left breast boost: Total dose 7.5 Gy over 3 fractions.    09/14/2014 - 01/13/2020 Anti-estrogen oral therapy   Anastrazole $RemoveBefo'1mg'nQrBXWrWAlB$  daily. Planned duration of treatment: 5 years [place]). Completed in 01/2020.    09/25/2014 Survivorship   Survivorship Care Plan given to patient and reviewed with her in person.    right colon cancer  09/19/2019 Imaging   CT AP W contrast 09/19/19  IMPRESSION Fullness in the cecum, cannot exclude a mass. No evidence for metastatic disease is identified.    09/24/2019 Procedure   Colonoscopy by Dr Eber Jones 09/24/19 IMPRESSION 1. The colon was redundant  2. Mild diverticulosis was noted through the entire examined colon 3. Single 35mm polyp was found in the ascending colon; polypectomy was performed using snare cautery and biopsy forceps 4. Mild diverticulosis was notes in the descending colon and sigmoid colon.  5. Single polyp was found in the sigmoid colon, polypectomy was performed with cold forceps.  6. Single polyp was found in the rectosigmoid colon; polypectomy was performed with cold snare  7. Small internal hemorrhoids  8. Large mass was found at the cecum; multiple biopsies of the area were performed using cold forceps; injection (tattooing) was performed distal to  the mass.    09/24/2019 Initial Biopsy   INTERPRETATION AND DIAGNOSIS:  A. Cecum, biopsy:  Invasive moderately differentiated adenocarcinoma.  see comment  B. Polyp @ ascending colon, polypectomy:  Tubular  Adenoma  C. Polyp @ sigmoid colon Polypectomy:  hyperplastic polyp.  D. Polyp @ rectosigmoid colon, Polypectomy:  Hyperplastic Polyp      10/16/2019 Imaging   CT Chest IMPRESSION: 1. Multiple small pulmonary nodules measuring 5 mm or less in size in the lungs. These are nonspecific and are typically considered statistically likely benign. However, given the patient's history of primary malignancy, close attention on follow-up studies is recommended to ensure stability. 2. Aortic atherosclerosis, in addition to right coronary artery disease. Assessment for potential risk factor modification, dietary therapy or pharmacologic therapy may be warranted, if clinically indicated. 3. There are calcifications of the aortic valve and mitral annulus. Echocardiographic correlation for evaluation of potential valvular dysfunction may be warranted if clinically indicated. 4. Small hiatal hernia.   Aortic Atherosclerosis (ICD10-I70.0).   11/01/2019 Initial Diagnosis   Colon cancer ([place])   11/01/2019 Surgery   LAPAROSCOPIC PARTIAL COLECTOMY by Dr Marcello Moores and Dr Johney Maine   11/01/2019 Pathology Results   FINAL MICROSCOPIC DIAGNOSIS:   A. COLON, PROXIMAL RIGHT, COLECTOMY:  - Invasive colonic adenocarcinoma, 5 cm.  - Tumor invades through the muscularis propria into pericolonic tissues.   - Margins of resection are not involved.  - Metastatic carcinoma in (5) of (13) lymph nodes.  - See oncology table.    MSI Stable  Mismatch repair normal  MLH1 - Preserved nuclear expression (greater 50% tumor expression) MSH2 - Preserved nuclear expression (greater 50% tumor expression) MSH6 - Preserved nuclear expression (greater 50% tumor expression) PMS2 - Preserved nuclear expression (greater 50% tumor expression)   11/01/2019 Cancer Staging   Staging form: Colon and Rectum, AJCC 8th Edition - Pathologic stage from 11/01/2019: Stage IIIB (pT3, pN2a, cM0) - Signed by Truitt Merle, MD on 11/29/2019   12/10/2019  Procedure   PAC placed 12/10/19   12/17/2019 -  Chemotherapy   FOLFOX q2weeks starting in 2 weeks starting 12/17/19      CURRENT THERAPY:  FOLFOX q2weeks starting in 2 weeksstarting 12/17/19. Held 01/27/20-02/10/20 due to b/l PE.   INTERVAL HISTORY:  Kim Castro is here for a follow up. She presents to the clinic alone. She notes she feels she is getting a cold in her head, possibly from sinuses. She denies fever and notes onset 4-5 days ago. Her nasal discharge and cough phlegm is clear. She notes her breathing is stable and adequately. She is tolerating Eliquis with no obvious bleeding. She notes she is up to date on her COVID and flu vaccines.  She notes she tolerating last cycle well with taste change but able to maintain weight. She notes her baseline minimal tingling in her finger tips has slightly increased. She notes having 2-3 BM daily. She plans to see her Dentist next week.    REVIEW OF SYSTEMS:   Constitutional: Denies fevers, chills or abnormal weight loss Eyes: Denies blurriness of vision Ears, nose, mouth, throat, and face: Denies mucositis or sore throat Respiratory: Denies dyspnea or wheezes (+) Sinus congestion, cough with clear phlegm  Cardiovascular: Denies palpitation, chest discomfort or lower extremity swelling Gastrointestinal:  Denies nausea, heartburn or change in bowel habits Skin: Denies abnormal skin rashes Lymphatics: Denies new lymphadenopathy or easy bruising Neurological: (+) Minimal tingling of fingers  Behavioral/Psych: Mood is stable, no new changes  All other systems  were reviewed with the patient and are negative.  MEDICAL HISTORY:  Past Medical History:  Diagnosis Date  . Aortic atherosclerosis ([place])   . Arthritis    feet, lower back  . Basal cell carcinoma    arm  . Breast cancer of upper-outer quadrant of left female breast ([place]) 06/04/2014  . Cataract    immature on the left  . Colon cancer ([place]) 08/2019  . Diabetes mellitus without  complication ([place])   . Diverticulosis   . Dizziness    > 24yrs ago;took Antivert   . Family history of anesthesia complication    sister slow to wake up with anesthesia  . Family history of breast cancer   . Family history of colon cancer   . Family history of uterine cancer   . GERD (gastroesophageal reflux disease)    takes occasional TUMs  . History of bronchitis    > 78yrs ago  . History of colon polyps   . History of hiatal hernia    Small noted on CT  . Hypertension    takes Losartan daily and HCTZ  . Iron deficiency anemia   . Joint pain   . Numbness    to toes on each foot  . Peripheral neuropathy    feet and toes  . Personal history of radiation therapy   . Pulmonary nodules    Noted on CT  . Radiation 07/31/14-08/28/14   Left Breast 20 fxs  . Seasonal allergies    takes Claritin prn  . Urinary frequency   . Vitamin D deficiency    takes VIt D daily    SURGICAL HISTORY: Past Surgical History:  Procedure Laterality Date  . BREAST BIOPSY Bilateral   . BREAST LUMPECTOMY Left   . BREAST LUMPECTOMY WITH RADIOACTIVE SEED LOCALIZATION Left 07/01/2014   Procedure: LEFT BREAST LUMPECTOMY WITH RADIOACTIVE SEED LOCALIZATION;  Surgeon: Excell Seltzer, MD;  Location: [facility];  Service: General;  Laterality: Left;  . CATARACT EXTRACTION Right   . COLONOSCOPY    . LAPAROSCOPIC PARTIAL COLECTOMY N/A 11/01/2019   Procedure: LAPAROSCOPIC PARTIAL COLECTOMY;  Surgeon: Leighton Ruff, MD;  Location: [facility];  Service: General;  Laterality: N/A;  . PORTACATH PLACEMENT N/A 12/10/2019   Procedure: INSERTION PORT-A-CATH ULTRASOUND GUIDED IN RIGHT IJ;  Surgeon: Leighton Ruff, MD;  Location: [facility];  Service: General;  Laterality: N/A;  . TOTAL KNEE ARTHROPLASTY Left 10/24/2012   Procedure: TOTAL KNEE ARTHROPLASTY;  Surgeon: Kerin Salen, MD;  Location: [facility];  Service: Orthopedics;  Laterality: Left;  . TOTAL KNEE ARTHROPLASTY Right 01/09/2013   Procedure: TOTAL KNEE  ARTHROPLASTY;  Surgeon: Kerin Salen, MD;  Location: [facility];  Service: Orthopedics;  Laterality: Right;  . TUBAL LIGATION      I have reviewed the social history and family history with the patient and they are unchanged from previous note.  ALLERGIES:  is allergic to oxycodone.  MEDICATIONS:  Current Outpatient Medications  Medication Sig Dispense Refill  . acetaminophen (TYLENOL) 500 MG tablet Take 2 tablets (1,000 mg total) by mouth every 6 (six) hours as needed. (Patient not taking: Reported on 12/03/2019) 30 tablet 0  . apixaban (ELIQUIS) 5 MG TABS tablet Take 2 tablets (10 mg total) by mouth 2 (two) times daily. Take 2 tablets ($RemoveBe'10mg'dlJohFWII$ ) 2 times daily x 7 days, then 1 tablet ($RemoveB'5mg'yelkvvJV$ ) 2 times daily 70 tablet 2  . Cholecalciferol (VITAMIN D) 2000 UNITS CAPS Take 2,000 Units by mouth daily.     Marland Kitchen  Ferrous Sulfate (SLOW FE PO) Take 1 tablet by mouth in the morning and at bedtime.     . furosemide (LASIX) 20 MG tablet Take 1 tablet by mouth once daily 30 tablet 0  . ibuprofen (ADVIL) 200 MG tablet Take 400 mg by mouth every 6 (six) hours as needed for moderate pain.     Marland Kitchen lidocaine-prilocaine (EMLA) cream Apply to affected area once (Patient taking differently: Apply 1 application topically daily as needed (port access). ) 30 g 3  . loratadine (CLARITIN) 10 MG tablet Take 10 mg by mouth daily as needed for allergies.    Marland Kitchen losartan (COZAAR) 25 MG tablet Take 25 mg by mouth daily.    . metFORMIN (GLUCOPHAGE) 500 MG tablet Take 1 tablet (500 mg total) by mouth daily with breakfast. (Patient taking differently: Take 500 mg by mouth in the morning and at bedtime. )    . Multiple Vitamins-Minerals (MULTIVITAMIN WITH MINERALS) tablet Take 1 tablet by mouth daily.    Marland Kitchen omeprazole (PRILOSEC) 20 MG capsule Take 20 mg by mouth daily before breakfast.     . ondansetron (ZOFRAN) 8 MG tablet Take 1 tablet (8 mg total) by mouth 2 (two) times daily as needed for refractory nausea / vomiting. Start on day 3 after  chemotherapy. 30 tablet 1  . potassium chloride (KLOR-CON) 10 MEQ tablet Take 1 tablet (10 mEq total) by mouth daily. 30 tablet 0  . pravastatin (PRAVACHOL) 10 MG tablet Take 10 mg by mouth daily.    . prochlorperazine (COMPAZINE) 10 MG tablet Take 1 tablet (10 mg total) by mouth every 6 (six) hours as needed (Nausea or vomiting). 30 tablet 1   No current facility-administered medications for this visit.   Facility-Administered Medications Ordered in Other Visits  Medication Dose Route Frequency Provider Last Rate Last Admin  . dextrose 5 % 100 mL with potassium chloride 20 mEq infusion   Intravenous Once Truitt Merle, MD      . fluorouracil (ADRUCIL) 5,400 mg in sodium chloride 0.9 % 142 mL chemo infusion  2,400 mg/m2 (Treatment Plan Recorded) Intravenous 1 day or 1 dose Truitt Merle, MD      . fluorouracil (ADRUCIL) chemo injection 900 mg  400 mg/m2 (Treatment Plan Recorded) Intravenous Once Truitt Merle, MD      . heparin lock flush 100 unit/mL  500 Units Intracatheter Once PRN Truitt Merle, MD      . leucovorin 904 mg in dextrose 5 % 250 mL infusion  400 mg/m2 (Treatment Plan Recorded) Intravenous Once Truitt Merle, MD      . oxaliplatin (ELOXATIN) 190 mg in dextrose 5 % 500 mL chemo infusion  85 mg/m2 (Treatment Plan Recorded) Intravenous Once Truitt Merle, MD      . sodium chloride flush (NS) 0.9 % injection 10 mL  10 mL Intracatheter PRN Truitt Merle, MD        PHYSICAL EXAMINATION: ECOG PERFORMANCE STATUS: 1 - Symptomatic but completely ambulatory  Vitals:   02/24/20 1111  BP: 130/61  Pulse: 73  Resp: 18  Temp: 97.9 F (36.6 C)  SpO2: 97%   Filed Weights   02/24/20 1111  Weight: 238 lb 12.8 oz (108.3 kg)    GENERAL:alert, no distress and comfortable SKIN: skin color, texture, turgor are normal, no rashes or significant lesions EYES: normal, Conjunctiva are pink and non-injected, sclera clear OROPHARYNX:no exudate, no erythema and lips, buccal mucosa, and tongue normal  NECK: supple, thyroid  normal size, non-tender, without nodularity LYMPH:  no palpable lymphadenopathy in the cervical, axillary  LUNGS: clear to auscultation and percussion with normal breathing effort HEART: regular rate & rhythm and no murmurs and no lower extremity edema ABDOMEN:abdomen soft, non-tender and normal bowel sounds Musculoskeletal:no cyanosis of digits and no clubbing  NEURO: alert & oriented x 3 with fluent speech, no focal motor/sensory deficits  LABORATORY DATA:  I have reviewed the data as listed CBC Latest Ref Rng & Units 02/24/2020 02/11/2020 01/30/2020  WBC 4.0 - 10.5 K/uL 12.7(H) 8.2 12.8(H)  Hemoglobin 12.0 - 15.0 g/dL 11.2(L) 11.4(L) 11.4(L)  Hematocrit 36 - 46 % 34.3(L) 35.3(L) 36.4  Platelets 150 - 400 K/uL 199 288 193     CMP Latest Ref Rng & Units 02/24/2020 02/11/2020 01/30/2020  Glucose 70 - 99 mg/dL 120(H) 160(H) 148(H)  BUN 8 - 23 mg/dL $Remove'9 22 17  'lrMrnom$ Creatinine 0.44 - 1.00 mg/dL 0.79 0.83 0.80  Sodium 135 - 145 mmol/L 142 141 142  Potassium 3.5 - 5.1 mmol/L 3.0(LL) 3.8 4.0  Chloride 98 - 111 mmol/L 105 106 104  CO2 22 - 32 mmol/L $RemoveB'28 24 27  'nLHKdgyt$ Calcium 8.9 - 10.3 mg/dL 9.5 9.7 9.2  Total Protein 6.5 - 8.1 g/dL 7.0 7.5 -  Total Bilirubin 0.3 - 1.2 mg/dL 0.9 0.8 -  Alkaline Phos 38 - 126 U/L 138(H) 121 -  AST 15 - 41 U/L 50(H) 35 -  ALT 0 - 44 U/L 32 29 -      RADIOGRAPHIC STUDIES: I have personally reviewed the radiological images as listed and agreed with the findings in the report. No results found.   ASSESSMENT & PLAN:  Poppi Scantling is a 74 y.o. female with    1.Right colon cancer, pT3N2aM0 stage IIB, MSS -She was diagnosed in 08/2019 withcecal mass biopsy showed moderately differentiatedadenocarcinoma.CT chest from 10/16/19 did shows multiple small lung nodules that were nonspecific but likely benign. Will monitor.  -She underwent colon surgery with Dr Marcello Moores on 11/01/19.Path showed overall Stage IIIB cancer. -I started her onadjuvant chemotherapy for 6  months to reduce her risk of recurrencewith FOLFOX q2weeks beginning 12/17/19.Based on neuropathy,Icanswitch her 5FU to Oral Xeloda single agent. FOLFOX held 01/27/20-02/10/20 due to b/l PE. I discussed this was likely related to her chemo.  -S/p C4 she continues to tolerate well.  She notes her basline minimal tingling in her finger tips has slightly increased. Will monitor as chemo continues.  -Labs reviewed and adequate to proceed with C5 FOLFOX today  F/u in 2 weeks   2.Iron deficient anemia -Her 10/03/19 Iron panel showed, Ferritin 13, Iron 34, sat ratios 8. Overall consistent with iron deficiency. -She did require blood transfusion on 11/06/19 after colon surgery. -Currently on2 tabs Slow Fe iron daily -Anemia mild and stable lately.   3.H/o left breast DCIS, G2, ER/PR+ -Dx in 05/2014. Treated with left lumpectomy with Dr Excell Seltzer, adjuvant RT with Dr Pablo Ledger. She has been on Anastrozole 08/2014-01/13/20. She was previously under the care of Dr Lindi Adie.  -Bone density 06/09/2015 T score +1.1 normal. Plan to repeat DEXA in 2021. -I will take over her breast cancer care given she is on long term surveillance.  4. Submassive bilateral pulmonary embolism, LE Edema  -S/p C3 chemo, her 01/27/20 CTA showed PE with right heart strain. She also has bilateral lower extremity edemaand Doppler ultrasound was negative for DVT bilaterally. She was treated with Heparin and she has been transitioned to Eliquis on discharge. She notes $75 copay with Eliquis but can manage for  now.  -I discussed given her blood clots were provoked plan to stop Eliquis after 6 months of treatment. If she has recurrent blood clot in future, I would recommend continuing anticoagulation indefinitely -She has been on Lasix and oral potassium for LE Edema. If her LE improves or resolves she can reduce or stop lasix and potassium, then use as needed.  5. Comorbidities: Arthritis, DM, HTN, GERD -f/u with PCP Dr.  Rex Kras  6. Cold Symptoms  -Pt notes over the past 4-5 days she has had sinus congestion and cough with clear phlegm. She denied fever, chills, throat pain or change in breathing. She is up to date on her COVID series and Flu shot.  -Based on her symptoms and exam today (02/24/20) I suspect mild virus. I recommend she use OTC Flonase and allergy medicine.    PLAN: -Labs reviewed and adequate to proceed with C5 FOLFOX today at same dose  -Lab, flush, f/u and FOLFOX in 2, 4, 6 weeks  -Continue Eliquis   No problem-specific Assessment & Plan notes found for this encounter.   No orders of the defined types were placed in this encounter.  All questions were answered. The patient knows to call the clinic with any problems, questions or concerns. No barriers to learning was detected. The total time spent in the appointment was 30 minutes.     Truitt Merle, MD 02/24/2020   I, Joslyn Devon, am acting as scribe for Truitt Merle, MD.   I have reviewed the above documentation for accuracy and completeness, and I agree with the above.

## 2020-02-24 ENCOUNTER — Encounter: Payer: Self-pay | Admitting: Hematology

## 2020-02-24 ENCOUNTER — Inpatient Hospital Stay (HOSPITAL_BASED_OUTPATIENT_CLINIC_OR_DEPARTMENT_OTHER): Payer: Medicare Other | Admitting: Hematology

## 2020-02-24 ENCOUNTER — Telehealth: Payer: Self-pay | Admitting: Hematology

## 2020-02-24 ENCOUNTER — Inpatient Hospital Stay: Payer: Medicare Other

## 2020-02-24 ENCOUNTER — Other Ambulatory Visit: Payer: Self-pay

## 2020-02-24 VITALS — BP 130/61 | HR 73 | Temp 97.9°F | Resp 18 | Ht 66.0 in | Wt 238.8 lb

## 2020-02-24 DIAGNOSIS — D509 Iron deficiency anemia, unspecified: Secondary | ICD-10-CM | POA: Diagnosis not present

## 2020-02-24 DIAGNOSIS — C182 Malignant neoplasm of ascending colon: Secondary | ICD-10-CM

## 2020-02-24 DIAGNOSIS — E119 Type 2 diabetes mellitus without complications: Secondary | ICD-10-CM | POA: Diagnosis not present

## 2020-02-24 DIAGNOSIS — Z5189 Encounter for other specified aftercare: Secondary | ICD-10-CM | POA: Diagnosis not present

## 2020-02-24 DIAGNOSIS — I10 Essential (primary) hypertension: Secondary | ICD-10-CM | POA: Diagnosis not present

## 2020-02-24 DIAGNOSIS — C50919 Malignant neoplasm of unspecified site of unspecified female breast: Secondary | ICD-10-CM | POA: Diagnosis not present

## 2020-02-24 DIAGNOSIS — Z5111 Encounter for antineoplastic chemotherapy: Secondary | ICD-10-CM | POA: Diagnosis not present

## 2020-02-24 DIAGNOSIS — Z7982 Long term (current) use of aspirin: Secondary | ICD-10-CM | POA: Diagnosis not present

## 2020-02-24 DIAGNOSIS — Z95828 Presence of other vascular implants and grafts: Secondary | ICD-10-CM

## 2020-02-24 DIAGNOSIS — Z86711 Personal history of pulmonary embolism: Secondary | ICD-10-CM | POA: Diagnosis not present

## 2020-02-24 DIAGNOSIS — Z7901 Long term (current) use of anticoagulants: Secondary | ICD-10-CM | POA: Diagnosis not present

## 2020-02-24 DIAGNOSIS — Z170 Estrogen receptor positive status [ER+]: Secondary | ICD-10-CM | POA: Diagnosis not present

## 2020-02-24 DIAGNOSIS — Z79899 Other long term (current) drug therapy: Secondary | ICD-10-CM | POA: Diagnosis not present

## 2020-02-24 DIAGNOSIS — Z853 Personal history of malignant neoplasm of breast: Secondary | ICD-10-CM | POA: Diagnosis not present

## 2020-02-24 DIAGNOSIS — Z923 Personal history of irradiation: Secondary | ICD-10-CM | POA: Diagnosis not present

## 2020-02-24 LAB — CMP (CANCER CENTER ONLY)
ALT: 32 U/L (ref 0–44)
AST: 50 U/L — ABNORMAL HIGH (ref 15–41)
Albumin: 3.8 g/dL (ref 3.5–5.0)
Alkaline Phosphatase: 138 U/L — ABNORMAL HIGH (ref 38–126)
Anion gap: 9 (ref 5–15)
BUN: 9 mg/dL (ref 8–23)
CO2: 28 mmol/L (ref 22–32)
Calcium: 9.5 mg/dL (ref 8.9–10.3)
Chloride: 105 mmol/L (ref 98–111)
Creatinine: 0.79 mg/dL (ref 0.44–1.00)
GFR, Estimated: >60 mL/min (ref >60)
Glucose, Bld: 120 mg/dL — ABNORMAL HIGH (ref 70–99)
Potassium: 3 mmol/L — CL (ref 3.5–5.1)
Sodium: 142 mmol/L (ref 135–145)
Total Bilirubin: 0.9 mg/dL (ref 0.3–1.2)
Total Protein: 7 g/dL (ref 6.5–8.1)

## 2020-02-24 LAB — CBC WITH DIFFERENTIAL (CANCER CENTER ONLY)
Abs Immature Granulocytes: 0.76 10*3/uL — ABNORMAL HIGH (ref 0.00–0.07)
Basophils Absolute: 0.1 10*3/uL (ref 0.0–0.1)
Basophils Relative: 1 %
Eosinophils Absolute: 0.5 10*3/uL (ref 0.0–0.5)
Eosinophils Relative: 4 %
HCT: 34.3 % — ABNORMAL LOW (ref 36.0–46.0)
Hemoglobin: 11.2 g/dL — ABNORMAL LOW (ref 12.0–15.0)
Immature Granulocytes: 6 %
Lymphocytes Relative: 30 %
Lymphs Abs: 3.8 10*3/uL (ref 0.7–4.0)
MCH: 31.7 pg (ref 26.0–34.0)
MCHC: 32.7 g/dL (ref 30.0–36.0)
MCV: 97.2 fL (ref 80.0–100.0)
Monocytes Absolute: 0.9 10*3/uL (ref 0.1–1.0)
Monocytes Relative: 7 %
Neutro Abs: 6.6 10*3/uL (ref 1.7–7.7)
Neutrophils Relative %: 52 %
Platelet Count: 199 10*3/uL (ref 150–400)
RBC: 3.53 MIL/uL — ABNORMAL LOW (ref 3.87–5.11)
RDW: 17.9 % — ABNORMAL HIGH (ref 11.5–15.5)
WBC Count: 12.7 10*3/uL — ABNORMAL HIGH (ref 4.0–10.5)
nRBC: 0.3 % — ABNORMAL HIGH (ref 0.0–0.2)

## 2020-02-24 LAB — FERRITIN: Ferritin: 215 ng/mL (ref 11–307)

## 2020-02-24 LAB — IRON AND TIBC
Iron: 129 ug/dL (ref 41–142)
Saturation Ratios: 34 % (ref 21–57)
TIBC: 383 ug/dL (ref 236–444)
UIBC: 254 ug/dL (ref 120–384)

## 2020-02-24 MED ORDER — PALONOSETRON HCL INJECTION 0.25 MG/5ML
INTRAVENOUS | Status: AC
  Filled 2020-02-24: qty 5

## 2020-02-24 MED ORDER — SODIUM CHLORIDE 0.9% FLUSH
10.0000 mL | INTRAVENOUS | Status: DC | PRN
  Filled 2020-02-24: qty 10

## 2020-02-24 MED ORDER — SODIUM CHLORIDE 0.9% FLUSH
10.0000 mL | INTRAVENOUS | Status: DC | PRN
  Administered 2020-02-24: 10 mL
  Filled 2020-02-24: qty 10

## 2020-02-24 MED ORDER — SODIUM CHLORIDE 0.9 % IV SOLN
10.0000 mg | Freq: Once | INTRAVENOUS | Status: AC
  Administered 2020-02-24: 10 mg via INTRAVENOUS
  Filled 2020-02-24: qty 10

## 2020-02-24 MED ORDER — SODIUM CHLORIDE 0.9 % IV SOLN
2400.0000 mg/m2 | INTRAVENOUS | Status: DC
  Administered 2020-02-24: 5400 mg via INTRAVENOUS
  Filled 2020-02-24: qty 108

## 2020-02-24 MED ORDER — POTASSIUM CHLORIDE 2 MEQ/ML IV SOLN
Freq: Once | INTRAVENOUS | Status: AC
  Filled 2020-02-24: qty 100

## 2020-02-24 MED ORDER — LEUCOVORIN CALCIUM INJECTION 350 MG
400.0000 mg/m2 | Freq: Once | INTRAVENOUS | Status: AC
  Administered 2020-02-24: 904 mg via INTRAVENOUS
  Filled 2020-02-24: qty 45.2

## 2020-02-24 MED ORDER — HEPARIN SOD (PORK) LOCK FLUSH 100 UNIT/ML IV SOLN
500.0000 [IU] | Freq: Once | INTRAVENOUS | Status: DC | PRN
  Filled 2020-02-24: qty 5

## 2020-02-24 MED ORDER — DEXTROSE 5 % IV SOLN
Freq: Once | INTRAVENOUS | Status: AC
  Filled 2020-02-24: qty 250

## 2020-02-24 MED ORDER — PALONOSETRON HCL INJECTION 0.25 MG/5ML
0.2500 mg | Freq: Once | INTRAVENOUS | Status: AC
  Administered 2020-02-24: 0.25 mg via INTRAVENOUS

## 2020-02-24 MED ORDER — FLUOROURACIL CHEMO INJECTION 2.5 GM/50ML
400.0000 mg/m2 | Freq: Once | INTRAVENOUS | Status: AC
  Administered 2020-02-24: 900 mg via INTRAVENOUS
  Filled 2020-02-24: qty 18

## 2020-02-24 MED ORDER — OXALIPLATIN CHEMO INJECTION 100 MG/20ML
85.0000 mg/m2 | Freq: Once | INTRAVENOUS | Status: AC
  Administered 2020-02-24: 190 mg via INTRAVENOUS
  Filled 2020-02-24: qty 38

## 2020-02-24 NOTE — Telephone Encounter (Signed)
Scheduled appointments per 10/25 los. Spoke to patient who is aware of appointments dates and times. Gave patient calendar print out.

## 2020-02-24 NOTE — Patient Instructions (Signed)
[facility] Discharge Instructions for Patients Receiving Chemotherapy  Today you received the following chemotherapy agents: oxaliplatin, leucovorin, and fluorouracil.  To help prevent nausea and vomiting after your treatment, we encourage you to take your nausea medication as directed.   If you develop nausea and vomiting that is not controlled by your nausea medication, call the clinic.   BELOW ARE SYMPTOMS THAT SHOULD BE REPORTED IMMEDIATELY:  *FEVER GREATER THAN 100.5 F  *CHILLS WITH OR WITHOUT FEVER  NAUSEA AND VOMITING THAT IS NOT CONTROLLED WITH YOUR NAUSEA MEDICATION  *UNUSUAL SHORTNESS OF BREATH  *UNUSUAL BRUISING OR BLEEDING  TENDERNESS IN MOUTH AND THROAT WITH OR WITHOUT PRESENCE OF ULCERS  *URINARY PROBLEMS  *BOWEL PROBLEMS  UNUSUAL RASH Items with * indicate a potential emergency and should be followed up as soon as possible.  Feel free to call the clinic should you have any questions or concerns. The clinic phone number is [phone].  Please show the CHEMO ALERT CARD at check-in to the Emergency Department and triage nurse.   

## 2020-02-25 ENCOUNTER — Telehealth: Payer: Self-pay | Admitting: *Deleted

## 2020-02-25 ENCOUNTER — Other Ambulatory Visit: Payer: Self-pay | Admitting: Hematology

## 2020-02-25 MED ORDER — POTASSIUM CHLORIDE ER 10 MEQ PO TBCR: 10.0000 meq | EXTENDED_RELEASE_TABLET | Freq: Two times a day (BID) | ORAL | 1 refills | Status: DC

## 2020-02-25 NOTE — Telephone Encounter (Signed)
-----   Message from Truitt Merle, MD sent at 02/25/2020  9:39 AM EDT ----- Her K low yesterday and we give iv KCL. I just refilled her KCL 71meq bid, please let pt know and encourage her to take it. Thanks   Truitt Merle

## 2020-02-25 NOTE — Telephone Encounter (Signed)
TCT patient regarding lab results from 02/24/20. Spoke with patient and informed that her K+ is low. Informed her that she received IV potassium yesterday with her chemo but needs to take oral supplement as well. Advised that Dr. Burr Medico called in KCL 10 meq BID. Pt states the pharmacy has called her. She voiced understanding on taking the po KCL

## 2020-02-26 ENCOUNTER — Inpatient Hospital Stay: Payer: Medicare Other

## 2020-02-26 ENCOUNTER — Other Ambulatory Visit: Payer: Self-pay

## 2020-02-26 VITALS — BP 130/50 | HR 74 | Temp 98.8°F | Resp 18

## 2020-02-26 DIAGNOSIS — C182 Malignant neoplasm of ascending colon: Secondary | ICD-10-CM | POA: Diagnosis not present

## 2020-02-26 DIAGNOSIS — Z5189 Encounter for other specified aftercare: Secondary | ICD-10-CM | POA: Diagnosis not present

## 2020-02-26 DIAGNOSIS — Z7901 Long term (current) use of anticoagulants: Secondary | ICD-10-CM | POA: Diagnosis not present

## 2020-02-26 DIAGNOSIS — Z7982 Long term (current) use of aspirin: Secondary | ICD-10-CM | POA: Diagnosis not present

## 2020-02-26 DIAGNOSIS — Z923 Personal history of irradiation: Secondary | ICD-10-CM | POA: Diagnosis not present

## 2020-02-26 DIAGNOSIS — Z79899 Other long term (current) drug therapy: Secondary | ICD-10-CM | POA: Diagnosis not present

## 2020-02-26 DIAGNOSIS — I10 Essential (primary) hypertension: Secondary | ICD-10-CM | POA: Diagnosis not present

## 2020-02-26 DIAGNOSIS — Z853 Personal history of malignant neoplasm of breast: Secondary | ICD-10-CM | POA: Diagnosis not present

## 2020-02-26 DIAGNOSIS — Z5111 Encounter for antineoplastic chemotherapy: Secondary | ICD-10-CM | POA: Diagnosis not present

## 2020-02-26 DIAGNOSIS — Z86711 Personal history of pulmonary embolism: Secondary | ICD-10-CM | POA: Diagnosis not present

## 2020-02-26 DIAGNOSIS — D509 Iron deficiency anemia, unspecified: Secondary | ICD-10-CM | POA: Diagnosis not present

## 2020-02-26 DIAGNOSIS — E119 Type 2 diabetes mellitus without complications: Secondary | ICD-10-CM | POA: Diagnosis not present

## 2020-02-26 MED ORDER — SODIUM CHLORIDE 0.9% FLUSH
10.0000 mL | INTRAVENOUS | Status: DC | PRN
  Administered 2020-02-26: 10 mL
  Filled 2020-02-26: qty 10

## 2020-02-26 MED ORDER — PEGFILGRASTIM-CBQV 6 MG/0.6ML ~~LOC~~ SOSY
PREFILLED_SYRINGE | SUBCUTANEOUS | Status: AC
  Filled 2020-02-26: qty 0.6

## 2020-02-26 MED ORDER — HEPARIN SOD (PORK) LOCK FLUSH 100 UNIT/ML IV SOLN
500.0000 [IU] | Freq: Once | INTRAVENOUS | Status: AC | PRN
  Administered 2020-02-26: 500 [IU]
  Filled 2020-02-26: qty 5

## 2020-02-26 MED ORDER — PEGFILGRASTIM-CBQV 6 MG/0.6ML ~~LOC~~ SOSY
6.0000 mg | PREFILLED_SYRINGE | Freq: Once | SUBCUTANEOUS | Status: AC
  Administered 2020-02-26: 6 mg via SUBCUTANEOUS

## 2020-02-26 NOTE — Patient Instructions (Signed)

## 2020-03-06 NOTE — Progress Notes (Signed)
[place]   [phone] [phone]) [phone]   Clinic Follow up Note   Patient Care Team: Hulan Fess, MD as PCP - General (Family Medicine) Excell Seltzer, MD (Inactive) as Consulting Physician (General Surgery) Rolm Bookbinder, MD as Consulting Physician (General Surgery) Thea Silversmith, MD as Consulting Physician (Radiation Oncology) Holley Bouche, NP (Inactive) as Nurse Practitioner (Nurse Practitioner) Truitt Merle, MD as Consulting Physician (Hematology) Jonnie Finner, RN as Oncology Nurse Navigator Leighton Ruff, MD as Consulting Physician (General Surgery) Nicholas Lose, MD as Consulting Physician (Hematology and Oncology) Leighton Ruff, MD as Consulting Physician (General Surgery) 03/09/2020  CHIEF COMPLAINT: F/u colon cancer, bilateral PE  SUMMARY OF ONCOLOGIC HISTORY: Oncology History Overview Note  Cancer Staging Breast cancer of upper-outer quadrant of left female breast [facility]) Staging form: Breast, AJCC 7th Edition - Clinical stage from 06/11/2014: Stage 0 (Tis (DCIS), N0, M0) - Unsigned - Pathologic stage from 07/03/2014: Stage Unknown (Tis (DCIS), NX, cM0) - Signed by Enid Cutter, MD on 07/10/2014 Staging comments: Staged on final lumpectomy specimen by Dr. Donato Heinz.  right colon cancer Staging form: Colon and Rectum, AJCC 8th Edition - Pathologic stage from 11/01/2019: Stage IIIB (pT3, pN2a, cM0) - Signed by Truitt Merle, MD on 11/29/2019    Malignant neoplasm of female breast ([place])  05/29/2014 Initial Biopsy   Left breast needle core biopsy: Grade 2, DCIS with calcs. ER+ (100%), PR+ (96%).    06/04/2014 Initial Diagnosis   Left breast DCIS with calcifications, ER 100%, PR 96%   06/10/2014 Breast MRI   Left breast: 2.4 x 1.3 x 1.1 cm area of patchy non-mass enhancement upper outer quadrant includes postbiopsy seroma; Right breast: 1.2 cm previously biopsied stable benign fibroadenoma   06/12/2014 Procedure   Genetic  counseling/testing: Identified 1 VUS on CHEK2 gene. Remainder of 17 gene panel tested negative and included: ATM, BARD1, BRCA 1/2, BRIP1, CDH1, CHEK2, EPCAM, MLH1, MSH2, MSH6, NBN, NF1, PALB2, PTEN, RAD50, RAD51C, RAD51D, STK11, and TP53.    07/01/2014 Surgery   Left breast lumpectomy ([facility]): Grade 1, DCIS, spanning 2.3 cm, 1 mm margin, ER 100%, PR 96%   07/31/2014 - 08/28/2014 Radiation Therapy   Adjuvant RT completed Pablo Ledger). Left breast: Total dose 42.5 Gy over 17 fractions. Left breast boost: Total dose 7.5 Gy over 3 fractions.    09/14/2014 - 01/13/2020 Anti-estrogen oral therapy   Anastrazole $RemoveBefo'1mg'KCEJxBuLoEs$  daily. Planned duration of treatment: 5 years [place]). Completed in 01/2020.    09/25/2014 Survivorship   Survivorship Care Plan given to patient and reviewed with her in person.    right colon cancer  09/19/2019 Imaging   CT AP W contrast 09/19/19  IMPRESSION Fullness in the cecum, cannot exclude a mass. No evidence for metastatic disease is identified.    09/24/2019 Procedure   Colonoscopy by Dr Eber Jones 09/24/19 IMPRESSION 1. The colon was redundant  2. Mild diverticulosis was noted through the entire examined colon 3. Single 58mm polyp was found in the ascending colon; polypectomy was performed using snare cautery and biopsy forceps 4. Mild diverticulosis was notes in the descending colon and sigmoid colon.  5. Single polyp was found in the sigmoid colon, polypectomy was performed with cold forceps.  6. Single polyp was found in the rectosigmoid colon; polypectomy was performed with cold snare  7. Small internal hemorrhoids  8. Large mass was found at the cecum; multiple biopsies of the area were performed using cold forceps; injection (tattooing) was performed distal to the mass.    09/24/2019  Initial Biopsy   INTERPRETATION AND DIAGNOSIS:  A. Cecum, biopsy:  Invasive moderately differentiated adenocarcinoma.  see comment  B. Polyp @ ascending colon, polypectomy:  Tubular Adenoma   C. Polyp @ sigmoid colon Polypectomy:  hyperplastic polyp.  D. Polyp @ rectosigmoid colon, Polypectomy:  Hyperplastic Polyp      10/16/2019 Imaging   CT Chest IMPRESSION: 1. Multiple small pulmonary nodules measuring 5 mm or less in size in the lungs. These are nonspecific and are typically considered statistically likely benign. However, given the patient's history of primary malignancy, close attention on follow-up studies is recommended to ensure stability. 2. Aortic atherosclerosis, in addition to right coronary artery disease. Assessment for potential risk factor modification, dietary therapy or pharmacologic therapy may be warranted, if clinically indicated. 3. There are calcifications of the aortic valve and mitral annulus. Echocardiographic correlation for evaluation of potential valvular dysfunction may be warranted if clinically indicated. 4. Small hiatal hernia.   Aortic Atherosclerosis (ICD10-I70.0).   11/01/2019 Initial Diagnosis   Colon cancer (HCC)   11/01/2019 Surgery   LAPAROSCOPIC PARTIAL COLECTOMY by Dr Maisie Fus and Dr Michaell Cowing   11/01/2019 Pathology Results   FINAL MICROSCOPIC DIAGNOSIS:   A. COLON, PROXIMAL RIGHT, COLECTOMY:  - Invasive colonic adenocarcinoma, 5 cm.  - Tumor invades through the muscularis propria into pericolonic tissues.   - Margins of resection are not involved.  - Metastatic carcinoma in (5) of (13) lymph nodes.  - See oncology table.    MSI Stable  Mismatch repair normal  MLH1 - Preserved nuclear expression (greater 50% tumor expression) MSH2 - Preserved nuclear expression (greater 50% tumor expression) MSH6 - Preserved nuclear expression (greater 50% tumor expression) PMS2 - Preserved nuclear expression (greater 50% tumor expression)   11/01/2019 Cancer Staging   Staging form: Colon and Rectum, AJCC 8th Edition - Pathologic stage from 11/01/2019: Stage IIIB (pT3, pN2a, cM0) - Signed by [name], MD on 11/29/2019   12/10/2019 Procedure    PAC placed 12/10/19   12/17/2019 -  Chemotherapy   FOLFOX q2weeks starting in 2 weeks starting 12/17/19     CURRENT THERAPY: Adjuvant FOLFOX q2weeks starting in 2 weeksstarting 12/17/19. Held 01/27/20-02/10/20 due to b/l PE.(plan for total 6 months adjuvant treatment)  INTERVAL HISTORY: Kim Castro returns for f/u and treatment as scheduled. She completed cycle 5 FOLFOX on 02/24/20.  Cold sensitivity lasting 10 days, mild residual neuropathy is stable from last treatment.  She is able to function well, not dropping items.  She has periodic diarrhea for a week after treatment, up to 4-5 small volume bowel movements the day after treatment, resolves with Imodium then may have to repeat again a couple days later.  No nausea or vomiting.  She is eating and drinking well, no mucositis.  She continues to have nasal congestion with intermittent blood-tinged drainage and cough with white phlegm.  No fever or chills.  She does not feel any chest congestion.  No chest pain.  Her mild residual dyspnea from bilateral PE is stable.   MEDICAL HISTORY:  Past Medical History:  Diagnosis Date  . Aortic atherosclerosis (HCC)   . Arthritis    feet, lower back  . Basal cell carcinoma    arm  . Breast cancer of upper-outer quadrant of left female breast (HCC) 06/04/2014  . Cataract    immature on the left  . Colon cancer (HCC) 08/2019  . Diabetes mellitus without complication (HCC)   . Diverticulosis   . Dizziness    > 32yrs ago;took  Antivert   . Family history of anesthesia complication    sister slow to wake up with anesthesia  . Family history of breast cancer   . Family history of colon cancer   . Family history of uterine cancer   . GERD (gastroesophageal reflux disease)    takes occasional TUMs  . History of bronchitis    > 18yrs ago  . History of colon polyps   . History of hiatal hernia    Small noted on CT  . Hypertension    takes Losartan daily and HCTZ  . Iron deficiency anemia   . Joint  pain   . Numbness    to toes on each foot  . Peripheral neuropathy    feet and toes  . Personal history of radiation therapy   . Pulmonary nodules    Noted on CT  . Radiation 07/31/14-08/28/14   Left Breast 20 fxs  . Seasonal allergies    takes Claritin prn  . Urinary frequency   . Vitamin D deficiency    takes VIt D daily    SURGICAL HISTORY: Past Surgical History:  Procedure Laterality Date  . BREAST BIOPSY Bilateral   . BREAST LUMPECTOMY Left   . BREAST LUMPECTOMY WITH RADIOACTIVE SEED LOCALIZATION Left 07/01/2014   Procedure: LEFT BREAST LUMPECTOMY WITH RADIOACTIVE SEED LOCALIZATION;  Surgeon: Excell Seltzer, MD;  Location: [facility];  Service: General;  Laterality: Left;  . CATARACT EXTRACTION Right   . COLONOSCOPY    . LAPAROSCOPIC PARTIAL COLECTOMY N/A 11/01/2019   Procedure: LAPAROSCOPIC PARTIAL COLECTOMY;  Surgeon: Leighton Ruff, MD;  Location: [facility];  Service: General;  Laterality: N/A;  . PORTACATH PLACEMENT N/A 12/10/2019   Procedure: INSERTION PORT-A-CATH ULTRASOUND GUIDED IN RIGHT IJ;  Surgeon: Leighton Ruff, MD;  Location: [facility];  Service: General;  Laterality: N/A;  . TOTAL KNEE ARTHROPLASTY Left 10/24/2012   Procedure: TOTAL KNEE ARTHROPLASTY;  Surgeon: Kerin Salen, MD;  Location: [facility];  Service: Orthopedics;  Laterality: Left;  . TOTAL KNEE ARTHROPLASTY Right 01/09/2013   Procedure: TOTAL KNEE ARTHROPLASTY;  Surgeon: Kerin Salen, MD;  Location: [facility];  Service: Orthopedics;  Laterality: Right;  . TUBAL LIGATION      I have reviewed the social history and family history with the patient and they are unchanged from previous note.  ALLERGIES:  is allergic to oxycodone.  MEDICATIONS:  Current Outpatient Medications  Medication Sig Dispense Refill  . acetaminophen (TYLENOL) 500 MG tablet Take 2 tablets (1,000 mg total) by mouth every 6 (six) hours as needed. (Patient not taking: Reported on 12/03/2019) 30 tablet 0  . amoxicillin-clavulanate  (AUGMENTIN) 875-125 MG tablet Take 1 tablet by mouth 2 (two) times daily. 14 tablet 0  . apixaban (ELIQUIS) 5 MG TABS tablet Take 2 tablets (10 mg total) by mouth 2 (two) times daily. Take 2 tablets ($RemoveBe'10mg'mAswErrJE$ ) 2 times daily x 7 days, then 1 tablet ($RemoveB'5mg'GjKpJOXq$ ) 2 times daily 70 tablet 2  . Cholecalciferol (VITAMIN D) 2000 UNITS CAPS Take 2,000 Units by mouth daily.     . Ferrous Sulfate (SLOW FE PO) Take 1 tablet by mouth in the morning and at bedtime.     . furosemide (LASIX) 20 MG tablet Take 1 tablet by mouth once daily 30 tablet 0  . ibuprofen (ADVIL) 200 MG tablet Take 400 mg by mouth every 6 (six) hours as needed for moderate pain.     Marland Kitchen lidocaine-prilocaine (EMLA) cream Apply to affected area once (Patient  taking differently: Apply 1 application topically daily as needed (port access). ) 30 g 3  . loratadine (CLARITIN) 10 MG tablet Take 10 mg by mouth daily as needed for allergies.    Marland Kitchen losartan (COZAAR) 25 MG tablet Take 25 mg by mouth daily.    . metFORMIN (GLUCOPHAGE) 500 MG tablet Take 1 tablet (500 mg total) by mouth daily with breakfast. (Patient taking differently: Take 500 mg by mouth in the morning and at bedtime. )    . Multiple Vitamins-Minerals (MULTIVITAMIN WITH MINERALS) tablet Take 1 tablet by mouth daily.    Marland Kitchen omeprazole (PRILOSEC) 20 MG capsule Take 20 mg by mouth daily before breakfast.     . ondansetron (ZOFRAN) 8 MG tablet Take 1 tablet (8 mg total) by mouth 2 (two) times daily as needed for refractory nausea / vomiting. Start on day 3 after chemotherapy. 30 tablet 1  . potassium chloride (KLOR-CON) 10 MEQ tablet Take 1 tablet (10 mEq total) by mouth 2 (two) times daily. 60 tablet 1  . pravastatin (PRAVACHOL) 10 MG tablet Take 10 mg by mouth daily.    . prochlorperazine (COMPAZINE) 10 MG tablet Take 1 tablet (10 mg total) by mouth every 6 (six) hours as needed (Nausea or vomiting). 30 tablet 1   No current facility-administered medications for this visit.   Facility-Administered  Medications Ordered in Other Visits  Medication Dose Route Frequency Provider Last Rate Last Admin  . fluorouracil (ADRUCIL) 5,400 mg in sodium chloride 0.9 % 142 mL chemo infusion  2,400 mg/m2 (Treatment Plan Recorded) Intravenous 1 day or 1 dose Truitt Merle, MD        PHYSICAL EXAMINATION: ECOG PERFORMANCE STATUS: 1 - Symptomatic but completely ambulatory  Vitals:   03/09/20 1140  BP: (!) 141/56  Pulse: 73  Resp: 18  Temp: (!) 97.1 F (36.2 C)  SpO2: 98%   Filed Weights   03/09/20 1140  Weight: 241 lb 8 oz (109.5 kg)    GENERAL:alert, no distress and comfortable SKIN: No rash EYES: sclera clear OROPHARYNX: No thrush or ulcers LUNGS: clear, no rhonchi or wheezing, normal breathing effort HEART: regular rate & rhythm, no lower extremity edema NEURO: alert & oriented x 3 with fluent speech PAC without erythema  LABORATORY DATA:  I have reviewed the data as listed CBC Latest Ref Rng & Units 03/09/2020 02/24/2020 02/11/2020  WBC 4.0 - 10.5 K/uL 18.9(H) 12.7(H) 8.2  Hemoglobin 12.0 - 15.0 g/dL 10.2(L) 11.2(L) 11.4(L)  Hematocrit 36 - 46 % 31.7(L) 34.3(L) 35.3(L)  Platelets 150 - 400 K/uL 175 199 288     CMP Latest Ref Rng & Units 03/09/2020 02/24/2020 02/11/2020  Glucose 70 - 99 mg/dL 154(H) 120(H) 160(H)  BUN 8 - 23 mg/dL $Remove'12 9 22  'iaHrMQv$ Creatinine 0.44 - 1.00 mg/dL 0.81 0.79 0.83  Sodium 135 - 145 mmol/L 145 142 141  Potassium 3.5 - 5.1 mmol/L 3.4(L) 3.0(LL) 3.8  Chloride 98 - 111 mmol/L 108 105 106  CO2 22 - 32 mmol/L $RemoveB'26 28 24  'awPXHQjS$ Calcium 8.9 - 10.3 mg/dL 8.7(L) 9.5 9.7  Total Protein 6.5 - 8.1 g/dL 6.8 7.0 7.5  Total Bilirubin 0.3 - 1.2 mg/dL 0.9 0.9 0.8  Alkaline Phos 38 - 126 U/L 163(H) 138(H) 121  AST 15 - 41 U/L 66(H) 50(H) 35  ALT 0 - 44 U/L 47(H) 32 29      RADIOGRAPHIC STUDIES: I have personally reviewed the radiological images as listed and agreed with the findings in the report. No results  found.   ASSESSMENT & PLAN:   1. Right colon cancer, pT3N2aM0 stage  IIB, MSS -She was diagnosed in 08/2019 withcecal mass biopsy showed moderately differentiated adenocarcinoma. InitialCTAPshowed no evidence of lymph node or distant metastasis, or radiographic concern for bowel obstruction. -CT chest from 10/16/19 did shows multiple small lung nodules that were nonspecific but likely benign. Will monitor.  -She underwent colon surgery with Dr Marcello Moores on 11/01/19.Path showed5cm of invasive colonic adenocarcinoma that was completely resected, clear margins with 5/13 LNs. Overall Stage IIIB cancer.  -Given the lymph node involvement her recurrence risk is very high, Dr. Morey Hummingbird has recommended adjuvant chemotherapy for 6 months to reduce her recurrence risk. She previously discussed Capox versus FOLFOX and the patient consented to FOLFOX.The goal of chemo is curative. -The goal is 6 months of treatment if she can tolerate then proceed with surveillance including CT scan every 6 months for first 2-3 years;will monitor lung nodules -She has recovered very well from surgery.Herbaseline neuropathy willbemonitor closely on treatment -Beganadjuvant chemo FOLFOX cycle 1 on 12/17/2019, tolerated well.   -chemo held 01/27/20 to 02/10/20 for bilateral PE, she recovered well   2. Submassive bilateral PE, LE edema  -she developed progressive dyspnea after cycle 3 chemo, 01/27/20 CTA showed PE with right heart strain.  Doppler was negative for DVT.  She was treated with heparin and transition to Eliquis -Given that her blood clots were provoked on chemo, the plan is to stop Eliquis after 6 months of treatment.  However if she develops recurrent blood clot in the future, the recommendation would be for indefinite anticoagulation  3.  Sinus congestion, cough -Ongoing for more than 2 weeks, symptoms have not resolved with OTC medication -She now has blood-tinged nasal drainage, productive cough with white sputum.  Afebrile, lungs clear -We will treat for sinusitis, Augmentin for  5-7 days -She knows to call if symptoms worsen or fail to improve on antibiotics  4.Iron deficient anemia -Her 10/03/19 Iron panel showed, Ferritin 13, Iron 34, sat ratios 8. Overall consistent with iron deficiency. -She did require blood transfusion on 11/06/19 after colon surgery. -Takes 2 tabs Slow Fe iron daily -Iron studies normalized, remain WNL and stable -Can reduce to 1 tab daily  5.H/o left breast DCIS, G2, ER/PR+ -Dx in 05/2014.S/p left lumpectomy with Dr Excell Seltzer, adjuvant RT with Dr Pablo Ledger. She completed adjuvant Anastrozole 08/2014 -01/2020, tolerated well. -Bone density 06/09/2015 T score +1.1 normal. Plan to repeat DEXA in 2021. -We will continue long-term surveillance with [place]  6. Comorbidities: Arthritis, DM, HTN, GERD -f/u with PCP Dr. Rex Kras  Disposition: Ms. Obeirne appears stable.  She completed 5 cycles of adjuvant FOLFOX.  She tolerates treatment well with cold sensitivity, stable residual neuropathy, and diarrhea.  Symptoms are well managed with supportive meds at home.  She is able to recover and function well.  She has persistent upper respiratory symptoms including blood-tinged nasal sputum and productive cough.  No fever or chills.  Lungs clear.  Symptoms have persisted over 7 days on supportive care.  I have given her prescription for Augmentin for what I suspect is sinusitis.  She knows to call if symptoms worsen or fail to improve.  We reviewed the CBC and CMP from today.  Labs adequate to proceed with cycle 6 FOLFOX today.  She will return for follow-up and next cycle in 2 weeks.  All questions were answered. The patient knows to call the clinic with any problems, questions or concerns. No barriers to learning were  detected.  Total encounter time is 30 minutes.     Kim Feeling, NP 03/09/20

## 2020-03-09 ENCOUNTER — Inpatient Hospital Stay: Payer: Medicare Other | Attending: Hematology

## 2020-03-09 ENCOUNTER — Inpatient Hospital Stay: Payer: Medicare Other | Admitting: Nurse Practitioner

## 2020-03-09 ENCOUNTER — Inpatient Hospital Stay: Payer: Medicare Other

## 2020-03-09 ENCOUNTER — Encounter: Payer: Self-pay | Admitting: Nurse Practitioner

## 2020-03-09 ENCOUNTER — Other Ambulatory Visit: Payer: Self-pay

## 2020-03-09 VITALS — BP 141/56 | HR 73 | Temp 97.1°F | Resp 18 | Ht 66.0 in | Wt 241.5 lb

## 2020-03-09 DIAGNOSIS — R059 Cough, unspecified: Secondary | ICD-10-CM | POA: Diagnosis not present

## 2020-03-09 DIAGNOSIS — Z86000 Personal history of in-situ neoplasm of breast: Secondary | ICD-10-CM | POA: Diagnosis not present

## 2020-03-09 DIAGNOSIS — Z9049 Acquired absence of other specified parts of digestive tract: Secondary | ICD-10-CM | POA: Diagnosis not present

## 2020-03-09 DIAGNOSIS — R0602 Shortness of breath: Secondary | ICD-10-CM | POA: Diagnosis not present

## 2020-03-09 DIAGNOSIS — R918 Other nonspecific abnormal finding of lung field: Secondary | ICD-10-CM | POA: Insufficient documentation

## 2020-03-09 DIAGNOSIS — C182 Malignant neoplasm of ascending colon: Secondary | ICD-10-CM

## 2020-03-09 DIAGNOSIS — K219 Gastro-esophageal reflux disease without esophagitis: Secondary | ICD-10-CM | POA: Insufficient documentation

## 2020-03-09 DIAGNOSIS — D509 Iron deficiency anemia, unspecified: Secondary | ICD-10-CM | POA: Diagnosis not present

## 2020-03-09 DIAGNOSIS — E119 Type 2 diabetes mellitus without complications: Secondary | ICD-10-CM | POA: Diagnosis not present

## 2020-03-09 DIAGNOSIS — Z7901 Long term (current) use of anticoagulants: Secondary | ICD-10-CM | POA: Insufficient documentation

## 2020-03-09 DIAGNOSIS — Z5189 Encounter for other specified aftercare: Secondary | ICD-10-CM | POA: Insufficient documentation

## 2020-03-09 DIAGNOSIS — M1990 Unspecified osteoarthritis, unspecified site: Secondary | ICD-10-CM | POA: Diagnosis not present

## 2020-03-09 DIAGNOSIS — Z8049 Family history of malignant neoplasm of other genital organs: Secondary | ICD-10-CM | POA: Insufficient documentation

## 2020-03-09 DIAGNOSIS — R0981 Nasal congestion: Secondary | ICD-10-CM | POA: Insufficient documentation

## 2020-03-09 DIAGNOSIS — Z5111 Encounter for antineoplastic chemotherapy: Secondary | ICD-10-CM | POA: Insufficient documentation

## 2020-03-09 DIAGNOSIS — Z79899 Other long term (current) drug therapy: Secondary | ICD-10-CM | POA: Diagnosis not present

## 2020-03-09 DIAGNOSIS — I10 Essential (primary) hypertension: Secondary | ICD-10-CM | POA: Insufficient documentation

## 2020-03-09 DIAGNOSIS — E559 Vitamin D deficiency, unspecified: Secondary | ICD-10-CM | POA: Insufficient documentation

## 2020-03-09 DIAGNOSIS — Z923 Personal history of irradiation: Secondary | ICD-10-CM | POA: Diagnosis not present

## 2020-03-09 DIAGNOSIS — Z86711 Personal history of pulmonary embolism: Secondary | ICD-10-CM | POA: Insufficient documentation

## 2020-03-09 DIAGNOSIS — Z803 Family history of malignant neoplasm of breast: Secondary | ICD-10-CM | POA: Insufficient documentation

## 2020-03-09 DIAGNOSIS — Z800 Family history of malignant neoplasm of digestive organs: Secondary | ICD-10-CM | POA: Insufficient documentation

## 2020-03-09 DIAGNOSIS — Z95828 Presence of other vascular implants and grafts: Secondary | ICD-10-CM

## 2020-03-09 LAB — CBC WITH DIFFERENTIAL (CANCER CENTER ONLY)
Abs Immature Granulocytes: 1.3 10*3/uL — ABNORMAL HIGH (ref 0.00–0.07)
Band Neutrophils: 7 %
Basophils Absolute: 0 10*3/uL (ref 0.0–0.1)
Basophils Relative: 0 %
Eosinophils Absolute: 0 10*3/uL (ref 0.0–0.5)
Eosinophils Relative: 0 %
HCT: 31.7 % — ABNORMAL LOW (ref 36.0–46.0)
Hemoglobin: 10.2 g/dL — ABNORMAL LOW (ref 12.0–15.0)
Lymphocytes Relative: 23 %
Lymphs Abs: 4.3 10*3/uL — ABNORMAL HIGH (ref 0.7–4.0)
MCH: 32.5 pg (ref 26.0–34.0)
MCHC: 32.2 g/dL (ref 30.0–36.0)
MCV: 101 fL — ABNORMAL HIGH (ref 80.0–100.0)
Metamyelocytes Relative: 5 %
Monocytes Absolute: 0.8 10*3/uL (ref 0.1–1.0)
Monocytes Relative: 4 %
Myelocytes: 2 %
Neutro Abs: 12.5 10*3/uL — ABNORMAL HIGH (ref 1.7–7.7)
Neutrophils Relative %: 59 %
Platelet Count: 175 10*3/uL (ref 150–400)
RBC: 3.14 MIL/uL — ABNORMAL LOW (ref 3.87–5.11)
RDW: 18.7 % — ABNORMAL HIGH (ref 11.5–15.5)
WBC Count: 18.9 10*3/uL — ABNORMAL HIGH (ref 4.0–10.5)
nRBC: 0.6 % — ABNORMAL HIGH (ref 0.0–0.2)

## 2020-03-09 LAB — CMP (CANCER CENTER ONLY)
ALT: 47 U/L — ABNORMAL HIGH (ref 0–44)
AST: 66 U/L — ABNORMAL HIGH (ref 15–41)
Albumin: 3.6 g/dL (ref 3.5–5.0)
Alkaline Phosphatase: 163 U/L — ABNORMAL HIGH (ref 38–126)
Anion gap: 11 (ref 5–15)
BUN: 12 mg/dL (ref 8–23)
CO2: 26 mmol/L (ref 22–32)
Calcium: 8.7 mg/dL — ABNORMAL LOW (ref 8.9–10.3)
Chloride: 108 mmol/L (ref 98–111)
Creatinine: 0.81 mg/dL (ref 0.44–1.00)
GFR, Estimated: >60 mL/min (ref >60)
Glucose, Bld: 154 mg/dL — ABNORMAL HIGH (ref 70–99)
Potassium: 3.4 mmol/L — ABNORMAL LOW (ref 3.5–5.1)
Sodium: 145 mmol/L (ref 135–145)
Total Bilirubin: 0.9 mg/dL (ref 0.3–1.2)
Total Protein: 6.8 g/dL (ref 6.5–8.1)

## 2020-03-09 MED ORDER — SODIUM CHLORIDE 0.9 % IV SOLN
2400.0000 mg/m2 | INTRAVENOUS | Status: DC
  Administered 2020-03-09: 5400 mg via INTRAVENOUS
  Filled 2020-03-09: qty 108

## 2020-03-09 MED ORDER — SODIUM CHLORIDE 0.9 % IV SOLN
10.0000 mg | Freq: Once | INTRAVENOUS | Status: AC
  Administered 2020-03-09: 10 mg via INTRAVENOUS
  Filled 2020-03-09: qty 10

## 2020-03-09 MED ORDER — PALONOSETRON HCL INJECTION 0.25 MG/5ML
0.2500 mg | Freq: Once | INTRAVENOUS | Status: AC
  Administered 2020-03-09: 0.25 mg via INTRAVENOUS

## 2020-03-09 MED ORDER — FLUOROURACIL CHEMO INJECTION 2.5 GM/50ML
400.0000 mg/m2 | Freq: Once | INTRAVENOUS | Status: AC
  Administered 2020-03-09: 900 mg via INTRAVENOUS
  Filled 2020-03-09: qty 18

## 2020-03-09 MED ORDER — SODIUM CHLORIDE 0.9% FLUSH
10.0000 mL | INTRAVENOUS | Status: DC | PRN
  Administered 2020-03-09: 10 mL
  Filled 2020-03-09: qty 10

## 2020-03-09 MED ORDER — LEUCOVORIN CALCIUM INJECTION 350 MG
400.0000 mg/m2 | Freq: Once | INTRAVENOUS | Status: AC
  Administered 2020-03-09: 904 mg via INTRAVENOUS
  Filled 2020-03-09: qty 45.2

## 2020-03-09 MED ORDER — OXALIPLATIN CHEMO INJECTION 100 MG/20ML
85.0000 mg/m2 | Freq: Once | INTRAVENOUS | Status: AC
  Administered 2020-03-09: 190 mg via INTRAVENOUS
  Filled 2020-03-09: qty 38

## 2020-03-09 MED ORDER — PALONOSETRON HCL INJECTION 0.25 MG/5ML
INTRAVENOUS | Status: AC
  Filled 2020-03-09: qty 5

## 2020-03-09 MED ORDER — DEXTROSE 5 % IV SOLN
Freq: Once | INTRAVENOUS | Status: AC
  Filled 2020-03-09: qty 250

## 2020-03-09 MED ORDER — AMOXICILLIN-POT CLAVULANATE 875-125 MG PO TABS: 1.0000 | ORAL_TABLET | Freq: Two times a day (BID) | ORAL | 0 refills | Status: DC

## 2020-03-09 NOTE — Patient Instructions (Signed)

## 2020-03-09 NOTE — Patient Instructions (Signed)
[facility] Discharge Instructions for Patients Receiving Chemotherapy  Today you received the following chemotherapy agents: oxaliplatin, leucovorin, and fluorouracil.  To help prevent nausea and vomiting after your treatment, we encourage you to take your nausea medication as directed.   If you develop nausea and vomiting that is not controlled by your nausea medication, call the clinic.   BELOW ARE SYMPTOMS THAT SHOULD BE REPORTED IMMEDIATELY:  *FEVER GREATER THAN 100.5 F  *CHILLS WITH OR WITHOUT FEVER  NAUSEA AND VOMITING THAT IS NOT CONTROLLED WITH YOUR NAUSEA MEDICATION  *UNUSUAL SHORTNESS OF BREATH  *UNUSUAL BRUISING OR BLEEDING  TENDERNESS IN MOUTH AND THROAT WITH OR WITHOUT PRESENCE OF ULCERS  *URINARY PROBLEMS  *BOWEL PROBLEMS  UNUSUAL RASH Items with * indicate a potential emergency and should be followed up as soon as possible.  Feel free to call the clinic should you have any questions or concerns. The clinic phone number is [phone].  Please show the CHEMO ALERT CARD at check-in to the Emergency Department and triage nurse.   

## 2020-03-11 ENCOUNTER — Telehealth: Payer: Self-pay | Admitting: Nurse Practitioner

## 2020-03-11 ENCOUNTER — Other Ambulatory Visit: Payer: Self-pay

## 2020-03-11 ENCOUNTER — Inpatient Hospital Stay: Payer: Medicare Other

## 2020-03-11 VITALS — BP 134/72 | HR 72 | Temp 98.2°F | Resp 18

## 2020-03-11 DIAGNOSIS — Z86711 Personal history of pulmonary embolism: Secondary | ICD-10-CM | POA: Diagnosis not present

## 2020-03-11 DIAGNOSIS — E559 Vitamin D deficiency, unspecified: Secondary | ICD-10-CM | POA: Diagnosis not present

## 2020-03-11 DIAGNOSIS — Z5111 Encounter for antineoplastic chemotherapy: Secondary | ICD-10-CM | POA: Diagnosis not present

## 2020-03-11 DIAGNOSIS — Z5189 Encounter for other specified aftercare: Secondary | ICD-10-CM | POA: Diagnosis not present

## 2020-03-11 DIAGNOSIS — Z7901 Long term (current) use of anticoagulants: Secondary | ICD-10-CM | POA: Diagnosis not present

## 2020-03-11 DIAGNOSIS — E119 Type 2 diabetes mellitus without complications: Secondary | ICD-10-CM | POA: Diagnosis not present

## 2020-03-11 DIAGNOSIS — M1990 Unspecified osteoarthritis, unspecified site: Secondary | ICD-10-CM | POA: Diagnosis not present

## 2020-03-11 DIAGNOSIS — C182 Malignant neoplasm of ascending colon: Secondary | ICD-10-CM

## 2020-03-11 DIAGNOSIS — K219 Gastro-esophageal reflux disease without esophagitis: Secondary | ICD-10-CM | POA: Diagnosis not present

## 2020-03-11 DIAGNOSIS — R059 Cough, unspecified: Secondary | ICD-10-CM | POA: Diagnosis not present

## 2020-03-11 DIAGNOSIS — R918 Other nonspecific abnormal finding of lung field: Secondary | ICD-10-CM | POA: Diagnosis not present

## 2020-03-11 DIAGNOSIS — Z86000 Personal history of in-situ neoplasm of breast: Secondary | ICD-10-CM | POA: Diagnosis not present

## 2020-03-11 DIAGNOSIS — R0981 Nasal congestion: Secondary | ICD-10-CM | POA: Diagnosis not present

## 2020-03-11 DIAGNOSIS — I10 Essential (primary) hypertension: Secondary | ICD-10-CM | POA: Diagnosis not present

## 2020-03-11 DIAGNOSIS — R0602 Shortness of breath: Secondary | ICD-10-CM | POA: Diagnosis not present

## 2020-03-11 DIAGNOSIS — D509 Iron deficiency anemia, unspecified: Secondary | ICD-10-CM | POA: Diagnosis not present

## 2020-03-11 MED ORDER — PEGFILGRASTIM-CBQV 6 MG/0.6ML ~~LOC~~ SOSY
PREFILLED_SYRINGE | SUBCUTANEOUS | Status: AC
  Filled 2020-03-11: qty 0.6

## 2020-03-11 MED ORDER — SODIUM CHLORIDE 0.9% FLUSH
10.0000 mL | INTRAVENOUS | Status: DC | PRN
  Administered 2020-03-11: 10 mL
  Filled 2020-03-11: qty 10

## 2020-03-11 MED ORDER — PEGFILGRASTIM-CBQV 6 MG/0.6ML ~~LOC~~ SOSY
6.0000 mg | PREFILLED_SYRINGE | Freq: Once | SUBCUTANEOUS | Status: AC
  Administered 2020-03-11: 6 mg via SUBCUTANEOUS

## 2020-03-11 MED ORDER — HEPARIN SOD (PORK) LOCK FLUSH 100 UNIT/ML IV SOLN
500.0000 [IU] | Freq: Once | INTRAVENOUS | Status: AC | PRN
  Administered 2020-03-11: 500 [IU]
  Filled 2020-03-11: qty 5

## 2020-03-11 NOTE — Patient Instructions (Signed)

## 2020-03-11 NOTE — Telephone Encounter (Signed)
No 1/18 los.  °

## 2020-03-16 ENCOUNTER — Other Ambulatory Visit: Payer: Self-pay | Admitting: Hematology

## 2020-03-17 DIAGNOSIS — Z0120 Encounter for dental examination and cleaning without abnormal findings: Secondary | ICD-10-CM | POA: Diagnosis not present

## 2020-03-20 NOTE — Progress Notes (Signed)
Kim Castro   [phone] Fax:([phone]) [phone]   Clinic Follow up Note   Patient Care Team: Hulan Fess, MD as PCP - General (Family Medicine) Excell Seltzer, MD (Inactive) as Consulting Physician (General Surgery) Rolm Bookbinder, MD as Consulting Physician (General Surgery) Thea Silversmith, MD as Consulting Physician (Radiation Oncology) Holley Bouche, NP (Inactive) as Nurse Practitioner (Nurse Practitioner) Truitt Merle, MD as Consulting Physician (Hematology) Jonnie Finner, RN as Oncology Nurse Navigator Leighton Ruff, MD as Consulting Physician (General Surgery) Nicholas Lose, MD as Consulting Physician (Hematology and Oncology) Leighton Ruff, MD as Consulting Physician (General Surgery)  Date of Service:  03/23/2020  CHIEF COMPLAINT: F/u of colon cancer, B/l PE  SUMMARY OF ONCOLOGIC HISTORY: Oncology History Overview Note  Cancer Staging Breast cancer of upper-outer quadrant of left female breast [facility]) Staging form: Breast, AJCC 7th Edition - Clinical stage from 06/11/2014: Stage 0 (Tis (DCIS), N0, M0) - Unsigned - Pathologic stage from 07/03/2014: Stage Unknown (Tis (DCIS), NX, cM0) - Signed by Enid Cutter, MD on 07/10/2014 Staging comments: Staged on final lumpectomy specimen by Dr. Donato Heinz.  right colon cancer Staging form: Colon and Rectum, AJCC 8th Edition - Pathologic stage from 11/01/2019: Stage IIIB (pT3, pN2a, cM0) - Signed by Truitt Merle, MD on 11/29/2019    Malignant neoplasm of female breast ([place])  05/29/2014 Initial Biopsy   Left breast needle core biopsy: Grade 2, DCIS with calcs. ER+ (100%), PR+ (96%).    06/04/2014 Initial Diagnosis   Left breast DCIS with calcifications, ER 100%, PR 96%   06/10/2014 Breast MRI   Left breast: 2.4 x 1.3 x 1.1 cm area of patchy non-mass enhancement upper outer quadrant includes postbiopsy seroma; Right breast: 1.2 cm previously biopsied stable benign fibroadenoma   06/12/2014 Procedure    Genetic counseling/testing: Identified 1 VUS on CHEK2 gene. Remainder of 17 gene panel tested negative and included: ATM, BARD1, BRCA 1/2, BRIP1, CDH1, CHEK2, EPCAM, MLH1, MSH2, MSH6, NBN, NF1, PALB2, PTEN, RAD50, RAD51C, RAD51D, STK11, and TP53.    07/01/2014 Surgery   Left breast lumpectomy ([place]): Grade 1, DCIS, spanning 2.3 cm, 1 mm margin, ER 100%, PR 96%   07/31/2014 - 08/28/2014 Radiation Therapy   Adjuvant RT completed Pablo Ledger). Left breast: Total dose 42.5 Gy over 17 fractions. Left breast boost: Total dose 7.5 Gy over 3 fractions.    09/14/2014 - 01/13/2020 Anti-estrogen oral therapy   Anastrazole 1m daily. Planned duration of treatment: 5 years ([place]. Completed in 01/2020.    09/25/2014 Survivorship   Survivorship Care Plan given to patient and reviewed with her in person.    right colon cancer  09/19/2019 Imaging   CT AP W contrast 09/19/19  IMPRESSION Fullness in the cecum, cannot exclude a mass. No evidence for metastatic disease is identified.    09/24/2019 Procedure   Colonoscopy by Dr MEber Jones5/25/21 IMPRESSION 1. The colon was redundant  2. Mild diverticulosis was noted through the entire examined colon 3. Single 121mpolyp was found in the ascending colon; polypectomy was performed using snare cautery and biopsy forceps 4. Mild diverticulosis was notes in the descending colon and sigmoid colon.  5. Single polyp was found in the sigmoid colon, polypectomy was performed with cold forceps.  6. Single polyp was found in the rectosigmoid colon; polypectomy was performed with cold snare  7. Small internal hemorrhoids  8. Large mass was found at the cecum; multiple biopsies of the area were performed using cold forceps; injection (tattooing) was performed distal to  the mass.    09/24/2019 Initial Biopsy   INTERPRETATION AND DIAGNOSIS:  A. Cecum, biopsy:  Invasive moderately differentiated adenocarcinoma.  see comment  B. Polyp @ ascending colon, polypectomy:  Tubular  Adenoma  C. Polyp @ sigmoid colon Polypectomy:  hyperplastic polyp.  D. Polyp @ rectosigmoid colon, Polypectomy:  Hyperplastic Polyp      10/16/2019 Imaging   CT Chest IMPRESSION: 1. Multiple small pulmonary nodules measuring 5 mm or less in size in the lungs. These are nonspecific and are typically considered statistically likely benign. However, given the patient's history of primary malignancy, close attention on follow-up studies is recommended to ensure stability. 2. Aortic atherosclerosis, in addition to right coronary artery disease. Assessment for potential risk factor modification, dietary therapy or pharmacologic therapy may be warranted, if clinically indicated. 3. There are calcifications of the aortic valve and mitral annulus. Echocardiographic correlation for evaluation of potential valvular dysfunction may be warranted if clinically indicated. 4. Small hiatal hernia.   Aortic Atherosclerosis (ICD10-I70.0).   11/01/2019 Initial Diagnosis   Colon cancer ([place])   11/01/2019 Surgery   LAPAROSCOPIC PARTIAL COLECTOMY by Dr Marcello Moores and Dr Johney Maine   11/01/2019 Pathology Results   FINAL MICROSCOPIC DIAGNOSIS:   A. COLON, PROXIMAL RIGHT, COLECTOMY:  - Invasive colonic adenocarcinoma, 5 cm.  - Tumor invades through the muscularis propria into pericolonic tissues.   - Margins of resection are not involved.  - Metastatic carcinoma in (5) of (13) lymph nodes.  - See oncology table.    MSI Stable  Mismatch repair normal  MLH1 - Preserved nuclear expression (greater 50% tumor expression) MSH2 - Preserved nuclear expression (greater 50% tumor expression) MSH6 - Preserved nuclear expression (greater 50% tumor expression) PMS2 - Preserved nuclear expression (greater 50% tumor expression)   11/01/2019 Cancer Staging   Staging form: Colon and Rectum, AJCC 8th Edition - Pathologic stage from 11/01/2019: Stage IIIB (pT3, pN2a, cM0) - Signed by Truitt Merle, MD on 11/29/2019   12/10/2019  Procedure   PAC placed 12/10/19   12/17/2019 -  Chemotherapy   FOLFOX q2weeks starting in 2 weeks starting 12/17/19      CURRENT THERAPY:  FOLFOX q2weeks starting in 2 weeksstarting 12/17/19. Held 01/27/20-02/10/20 due to b/l PE.  INTERVAL HISTORY:  Kim Castro is here for a follow up. She presents to the clinic alone. She notes after C6 infusion she was very winded walking from infusion room to the parking garage. She notes this resolved. She notes an episode of being SOB when talking to her daughter on the phone and needed a break. She denies chest heaviness, only short of breath. She also notes back pain below her right shoulder blade. She notes Tylenol helped. She notes this pain started 3 days after her GCSF injection. She notes mild tingling in her fingers which is exacerbated by the cold.     REVIEW OF SYSTEMS:   Constitutional: Denies fevers, chills or abnormal weight loss Eyes: Denies blurriness of vision Ears, nose, mouth, throat, and face: Denies mucositis or sore throat Respiratory: Denies cough or wheezes (+) Intermittent SOB  Cardiovascular: Denies palpitation, chest discomfort or lower extremity swelling Gastrointestinal:  Denies nausea, heartburn or change in bowel habits Skin: Denies abnormal skin rashes MSK: (+) intermittent pain below right scapula  Lymphatics: Denies new lymphadenopathy or easy bruising Neurological: (+) Mild tingling of fingers (+) Prolonged cold sensitivity  Behavioral/Psych: Mood is stable, no new changes  All other systems were reviewed with the patient and are negative.  MEDICAL  HISTORY:  Past Medical History:  Diagnosis Date  . Aortic atherosclerosis ([place])   . Arthritis    feet, lower back  . Basal cell carcinoma    arm  . Breast cancer of upper-outer quadrant of left female breast ([place]) 06/04/2014  . Cataract    immature on the left  . Colon cancer ([place]) 08/2019  . Diabetes mellitus without complication ([place])   . Diverticulosis    . Dizziness    > 26yr ago;took Antivert   . Family history of anesthesia complication    sister slow to wake up with anesthesia  . Family history of breast cancer   . Family history of colon cancer   . Family history of uterine cancer   . GERD (gastroesophageal reflux disease)    takes occasional TUMs  . History of bronchitis    > 89[place]ago  . History of colon polyps   . History of hiatal hernia    Small noted on CT  . Hypertension    takes Losartan daily and HCTZ  . Iron deficiency anemia   . Joint pain   . Numbness    to toes on each foot  . Peripheral neuropathy    feet and toes  . Personal history of radiation therapy   . Pulmonary nodules    Noted on CT  . Radiation 07/31/14-08/28/14   Left Breast 20 fxs  . Seasonal allergies    takes Claritin prn  . Urinary frequency   . Vitamin D deficiency    takes VIt D daily    SURGICAL HISTORY: Past Surgical History:  Procedure Laterality Date  . BREAST BIOPSY Bilateral   . BREAST LUMPECTOMY Left   . BREAST LUMPECTOMY WITH RADIOACTIVE SEED LOCALIZATION Left 07/01/2014   Procedure: LEFT BREAST LUMPECTOMY WITH RADIOACTIVE SEED LOCALIZATION;  Surgeon: BeExcell SeltzerMD;  [facility] Service: General;  Laterality: Left;  . CATARACT EXTRACTION Right   . COLONOSCOPY    . LAPAROSCOPIC PARTIAL COLECTOMY N/A 11/01/2019   Procedure: LAPAROSCOPIC PARTIAL COLECTOMY;  Surgeon: ThLeighton RuffMD;  Location: [facility];  Service: General;  Laterality: N/A;  . PORTACATH PLACEMENT N/A 12/10/2019   Procedure: INSERTION PORT-A-CATH ULTRASOUND GUIDED IN RIGHT IJ;  Surgeon: ThLeighton RuffMD;  Location: [facility];  Service: General;  Laterality: N/A;  . TOTAL KNEE ARTHROPLASTY Left 10/24/2012   Procedure: TOTAL KNEE ARTHROPLASTY;  Surgeon: FrKerin SalenMD;  [facility];  Laterality: Left;  . TOTAL KNEE ARTHROPLASTY Right 01/09/2013   Procedure: TOTAL KNEE ARTHROPLASTY;  Surgeon: FrKerin Salen MD;  [facility]: [facility];  Laterality: Right;  . TUBAL LIGATION      I have reviewed the social history and family history with the patient and they are unchanged from previous note.  ALLERGIES:  is allergic to oxycodone.  MEDICATIONS:  Current Outpatient Medications  Medication Sig Dispense Refill  . acetaminophen (TYLENOL) 500 MG tablet Take 2 tablets (1,000 mg total) by mouth every 6 (six) hours as needed. (Patient not taking: Reported on 12/03/2019) 30 tablet 0  . amoxicillin-clavulanate (AUGMENTIN) 875-125 MG tablet Take 1 tablet by mouth 2 (two) times daily. 14 tablet 0  . apixaban (ELIQUIS) 5 MG TABS tablet Take 2 tablets (10 mg total) by mouth 2 (two) times daily. Take 2 tablets (1085m2 times daily x 7 days, then 1 tablet (5mg40m times daily 70 tablet 2  . Cholecalciferol (VITAMIN D) 2000 UNITS CAPS  Take 2,000 Units by mouth daily.     . Ferrous Sulfate (SLOW FE PO) Take 1 tablet by mouth in the morning and at bedtime.     . furosemide (LASIX) 20 MG tablet Take 1 tablet by mouth once daily 30 tablet 0  . ibuprofen (ADVIL) 200 MG tablet Take 400 mg by mouth every 6 (six) hours as needed for moderate pain.     Marland Kitchen lidocaine-prilocaine (EMLA) cream Apply to affected area once (Patient taking differently: Apply 1 application topically daily as needed (port access). ) 30 g 3  . loratadine (CLARITIN) 10 MG tablet Take 10 mg by mouth daily as needed for allergies.    Marland Kitchen losartan (COZAAR) 25 MG tablet Take 25 mg by mouth daily.    . metFORMIN (GLUCOPHAGE) 500 MG tablet Take 1 tablet (500 mg total) by mouth daily with breakfast. (Patient taking differently: Take 500 mg by mouth in the morning and at bedtime. )    . Multiple Vitamins-Minerals (MULTIVITAMIN WITH MINERALS) tablet Take 1 tablet by mouth daily.    Marland Kitchen omeprazole (PRILOSEC) 20 MG capsule Take 20 mg by mouth daily before breakfast.     . ondansetron (ZOFRAN) 8 MG tablet Take 1 tablet (8 mg total) by mouth 2 (two) times  daily as needed for refractory nausea / vomiting. Start on day 3 after chemotherapy. 30 tablet 1  . potassium chloride (KLOR-CON) 10 MEQ tablet Take 1 tablet (10 mEq total) by mouth 2 (two) times daily. 60 tablet 1  . pravastatin (PRAVACHOL) 10 MG tablet Take 10 mg by mouth daily.    . prochlorperazine (COMPAZINE) 10 MG tablet Take 1 tablet (10 mg total) by mouth every 6 (six) hours as needed (Nausea or vomiting). 30 tablet 1   No current facility-administered medications for this visit.    PHYSICAL EXAMINATION: ECOG PERFORMANCE STATUS: 2 - Symptomatic, <50% confined to bed  Vitals:   03/23/20 0947  BP: (!) 165/72  Pulse: 96  Resp: 20  Temp: 97.8 F (36.6 C)  SpO2: 96%   Filed Weights   03/23/20 0947  Weight: 236 lb 12.8 oz (107.4 kg)    GENERAL:alert, no distress and comfortable SKIN: skin color, texture, turgor are normal, no rashes or significant lesions EYES: normal, Conjunctiva are pink and non-injected, sclera clear  NECK: supple, thyroid normal size, non-tender, without nodularity LYMPH:  no palpable lymphadenopathy in the cervical, axillary  LUNGS: clear to auscultation and percussion with normal breathing effort HEART: regular rate & rhythm and no murmurs and no lower extremity edema ABDOMEN:abdomen soft, non-tender and normal bowel sounds Musculoskeletal:no cyanosis of digits and no clubbing  NEURO: alert & oriented x 3 with fluent speech, (+) Mild to moderate sensory deficits in hands.   LABORATORY DATA:  I have reviewed the data as listed CBC Latest Ref Rng & Units 03/23/2020 03/09/2020 02/24/2020  WBC 4.0 - 10.5 K/uL 14.8(H) 18.9(H) 12.7(H)  Hemoglobin 12.0 - 15.0 g/dL 10.5(L) 10.2(L) 11.2(L)  Hematocrit 36 - 46 % 32.6(L) 31.7(L) 34.3(L)  Platelets 150 - 400 K/uL 169 175 199     CMP Latest Ref Rng & Units 03/23/2020 03/09/2020 02/24/2020  Glucose 70 - 99 mg/dL 129(H) 154(H) 120(H)  BUN 8 - 23 mg/dL _0 Creatinine 0.44 - 1.00 mg/dL 0.81 0.81 0.79   Sodium 135 - 145 mmol/L 144 145 142  Potassium 3.5 - 5.1 mmol/L 3.4(L) 3.4(L) 3.0(LL)  Chloride 98 - 111 mmol/L 107 108 105  CO2 22 - 32 mmol/L  _0 Calcium 8.9 - 10.3 mg/dL 9.3 8.7(L) 9.5  Total Protein 6.5 - 8.1 g/dL 7.3 6.8 7.0  Total Bilirubin 0.3 - 1.2 mg/dL 1.1 0.9 0.9  Alkaline Phos 38 - 126 U/L 175(H) 163(H) 138(H)  AST 15 - 41 U/L 36 66(H) 50(H)  ALT 0 - 44 U/L 28 47(H) 32      RADIOGRAPHIC STUDIES: I have personally reviewed the radiological images as listed and agreed with the findings in the report. No results found.   ASSESSMENT & PLAN:  Kim Castro is a 74 y.o. female with    1.Right colon cancer, pT3N2aM0 stage IIB, MSS -She was diagnosed in 08/2019 withcecal mass biopsy showed moderately differentiatedadenocarcinoma.CT chest from 10/16/19 did shows multiple small lung nodules that were nonspecific but likely benign. Will monitor.  -She underwent colon surgery with Dr Marcello Moores on 11/01/19.Path showed overall Stage IIIB cancer. -I started her onadjuvant chemotherapy for 6 months to reduce her risk of recurrencewith FOLFOXq2weeks beginning 12/17/19.Based on neuropathy,Icanswitch her 5FU to Oral Xeloda single agent. FOLFOX held 01/27/20-02/10/20 due to b/l PE. I discussed this was likely related to her chemo.  -S/p C6 she has had occasional episodes of SOB. She also has prolong cold sensitivity and mild neuropathy of fingers and toes after C6.  -Labs reviewed, WBC 14.8, Hg 10.5. Overall adequate to proceed with C7 FOLFOX today with reduced Oxaliplatin dose to 61m/m2 -F/u in 2 weeks   2.Iron deficient anemia -Her 10/03/19 Iron panel showed, Ferritin 13, Iron 34, sat ratios 8. Overall consistent with iron deficiency. -She did require blood transfusion on 11/06/19 after colon surgery. -Currently on2 tabs Slow Fe iron daily -Anemia mild and stable lately.  3.H/o left breast DCIS, G2, ER/PR+ -Dx in 05/2014. Treated with left lumpectomy with Dr  HExcell Seltzer adjuvant RT with Dr WPablo Ledger She has been on Anastrozole 08/2014-01/13/20.She waspreviouslyunder the care ofDr Gudena.  -Bone density 06/09/2015 T score +1.1 normal. Plan to repeat DEXA in 2021. -I will take over her breast cancer care given she is on long term surveillance.  4.Submassive bilateral pulmonary embolism, LE Edema -S/p C3 chemo, her 9/27/21CTA showed PE with right heart strain.She also has bilateral lower extremity edemaand Doppler ultrasound was negative for DVT bilaterally.She was treated withHeparin and [facility]She notes $75 copay with Eliquis but can manage for now.  -I discussed given her blood clots were provoked plan to stop Eliquis after 6 months of treatment. If she hasrecurrentblood clotin future, I would recommend continuinganticoagulation indefinitely -She has been on Lasix and oral potassium for LE Edema. If her LE improves or resolves she can reduce or stop lasix and potassium, then use as needed.  5. Comorbidities: Arthritis, DM, HTN, GERD -f/u with PCP Dr. LRex Kras 6. SOB -S/p C6 she has had 2 episode of significant transient SOB after chemo. She has continues her blood thinner.  -Lungs and breathing sounds normal on exam today (03/23/20). Will obtain angio chest. She is agreeable.  -Will watch on dose reduced Oxaliplatin.   PLAN: -Labs reviewed and adequate to proceed with C7 FOLFOX today with Oxaliplatin dose reduction to 70 mg/m2 -Stat CT chest to rule out PE tomorrow morning  -Lab, flush, f/u and FOLFOX in 2, 4, 6 weeks -Continue Eliquis    No problem-specific Assessment & Plan notes found for this encounter.   Orders Placed This Encounter  Procedures  . CT ANGIO CHEST PE W OR WO CONTRAST    Standing Status:  Future    Standing Expiration Date:   03/23/2021    Order Specific Question:   If indicated for the ordered procedure, I authorize the administration of contrast media  per Radiology protocol    Answer:   Yes    Order Specific Question:   Preferred imaging location?    Answer:   [facility]   All questions were answered. The patient knows to call the clinic with any problems, questions or concerns. No barriers to learning was detected. The total time spent in the appointment was 30 minutes.     Truitt Merle, MD 03/23/2020   I, Joslyn Devon, am acting as scribe for Truitt Merle, MD.   I have reviewed the above documentation for accuracy and completeness, and I agree with the above.

## 2020-03-23 ENCOUNTER — Inpatient Hospital Stay: Payer: Medicare Other

## 2020-03-23 ENCOUNTER — Encounter: Payer: Self-pay | Admitting: Hematology

## 2020-03-23 ENCOUNTER — Other Ambulatory Visit: Payer: Self-pay

## 2020-03-23 ENCOUNTER — Inpatient Hospital Stay: Payer: Medicare Other | Admitting: Hematology

## 2020-03-23 VITALS — BP 165/72 | HR 96 | Temp 97.8°F | Resp 20 | Ht 66.0 in | Wt 236.8 lb

## 2020-03-23 DIAGNOSIS — C182 Malignant neoplasm of ascending colon: Secondary | ICD-10-CM

## 2020-03-23 DIAGNOSIS — M1990 Unspecified osteoarthritis, unspecified site: Secondary | ICD-10-CM | POA: Diagnosis not present

## 2020-03-23 DIAGNOSIS — D509 Iron deficiency anemia, unspecified: Secondary | ICD-10-CM | POA: Diagnosis not present

## 2020-03-23 DIAGNOSIS — Z5189 Encounter for other specified aftercare: Secondary | ICD-10-CM | POA: Diagnosis not present

## 2020-03-23 DIAGNOSIS — R0981 Nasal congestion: Secondary | ICD-10-CM | POA: Diagnosis not present

## 2020-03-23 DIAGNOSIS — Z86000 Personal history of in-situ neoplasm of breast: Secondary | ICD-10-CM | POA: Diagnosis not present

## 2020-03-23 DIAGNOSIS — I10 Essential (primary) hypertension: Secondary | ICD-10-CM | POA: Diagnosis not present

## 2020-03-23 DIAGNOSIS — R079 Chest pain, unspecified: Secondary | ICD-10-CM | POA: Diagnosis not present

## 2020-03-23 DIAGNOSIS — E559 Vitamin D deficiency, unspecified: Secondary | ICD-10-CM | POA: Diagnosis not present

## 2020-03-23 DIAGNOSIS — R059 Cough, unspecified: Secondary | ICD-10-CM | POA: Diagnosis not present

## 2020-03-23 DIAGNOSIS — Z86711 Personal history of pulmonary embolism: Secondary | ICD-10-CM | POA: Diagnosis not present

## 2020-03-23 DIAGNOSIS — E119 Type 2 diabetes mellitus without complications: Secondary | ICD-10-CM | POA: Diagnosis not present

## 2020-03-23 DIAGNOSIS — K219 Gastro-esophageal reflux disease without esophagitis: Secondary | ICD-10-CM | POA: Diagnosis not present

## 2020-03-23 DIAGNOSIS — Z5111 Encounter for antineoplastic chemotherapy: Secondary | ICD-10-CM | POA: Diagnosis not present

## 2020-03-23 DIAGNOSIS — Z170 Estrogen receptor positive status [ER+]: Secondary | ICD-10-CM

## 2020-03-23 DIAGNOSIS — R918 Other nonspecific abnormal finding of lung field: Secondary | ICD-10-CM | POA: Diagnosis not present

## 2020-03-23 DIAGNOSIS — C50919 Malignant neoplasm of unspecified site of unspecified female breast: Secondary | ICD-10-CM

## 2020-03-23 DIAGNOSIS — Z7901 Long term (current) use of anticoagulants: Secondary | ICD-10-CM | POA: Diagnosis not present

## 2020-03-23 DIAGNOSIS — R0602 Shortness of breath: Secondary | ICD-10-CM | POA: Diagnosis not present

## 2020-03-23 DIAGNOSIS — Z95828 Presence of other vascular implants and grafts: Secondary | ICD-10-CM

## 2020-03-23 LAB — CBC WITH DIFFERENTIAL (CANCER CENTER ONLY)
Abs Immature Granulocytes: 2.43 10*3/uL — ABNORMAL HIGH (ref 0.00–0.07)
Basophils Absolute: 0.1 10*3/uL (ref 0.0–0.1)
Basophils Relative: 0 %
Eosinophils Absolute: 0.1 10*3/uL (ref 0.0–0.5)
Eosinophils Relative: 1 %
HCT: 32.6 % — ABNORMAL LOW (ref 36.0–46.0)
Hemoglobin: 10.5 g/dL — ABNORMAL LOW (ref 12.0–15.0)
Immature Granulocytes: 16 %
Lymphocytes Relative: 28 %
Lymphs Abs: 4.1 10*3/uL — ABNORMAL HIGH (ref 0.7–4.0)
MCH: 33.1 pg (ref 26.0–34.0)
MCHC: 32.2 g/dL (ref 30.0–36.0)
MCV: 102.8 fL — ABNORMAL HIGH (ref 80.0–100.0)
Monocytes Absolute: 1.3 10*3/uL — ABNORMAL HIGH (ref 0.1–1.0)
Monocytes Relative: 9 %
Neutro Abs: 6.8 10*3/uL (ref 1.7–7.7)
Neutrophils Relative %: 46 %
Platelet Count: 169 10*3/uL (ref 150–400)
RBC: 3.17 MIL/uL — ABNORMAL LOW (ref 3.87–5.11)
RDW: 20 % — ABNORMAL HIGH (ref 11.5–15.5)
WBC Count: 14.8 10*3/uL — ABNORMAL HIGH (ref 4.0–10.5)
nRBC: 0.7 % — ABNORMAL HIGH (ref 0.0–0.2)

## 2020-03-23 LAB — CMP (CANCER CENTER ONLY)
ALT: 28 U/L (ref 0–44)
AST: 36 U/L (ref 15–41)
Albumin: 3.8 g/dL (ref 3.5–5.0)
Alkaline Phosphatase: 175 U/L — ABNORMAL HIGH (ref 38–126)
Anion gap: 13 (ref 5–15)
BUN: 10 mg/dL (ref 8–23)
CO2: 24 mmol/L (ref 22–32)
Calcium: 9.3 mg/dL (ref 8.9–10.3)
Chloride: 107 mmol/L (ref 98–111)
Creatinine: 0.81 mg/dL (ref 0.44–1.00)
GFR, Estimated: >60 mL/min (ref >60)
Glucose, Bld: 129 mg/dL — ABNORMAL HIGH (ref 70–99)
Potassium: 3.4 mmol/L — ABNORMAL LOW (ref 3.5–5.1)
Sodium: 144 mmol/L (ref 135–145)
Total Bilirubin: 1.1 mg/dL (ref 0.3–1.2)
Total Protein: 7.3 g/dL (ref 6.5–8.1)

## 2020-03-23 LAB — CEA (IN HOUSE-CHCC): CEA (CHCC-In House): 5.24 ng/mL — ABNORMAL HIGH (ref 0.00–5.00)

## 2020-03-23 MED ORDER — DEXTROSE 5 % IV SOLN
Freq: Once | INTRAVENOUS | Status: AC
  Filled 2020-03-23: qty 250

## 2020-03-23 MED ORDER — PALONOSETRON HCL INJECTION 0.25 MG/5ML
0.2500 mg | Freq: Once | INTRAVENOUS | Status: AC
  Administered 2020-03-23: 0.25 mg via INTRAVENOUS

## 2020-03-23 MED ORDER — OXALIPLATIN CHEMO INJECTION 100 MG/20ML
70.0000 mg/m2 | Freq: Once | INTRAVENOUS | Status: AC
  Administered 2020-03-23: 160 mg via INTRAVENOUS
  Filled 2020-03-23: qty 32

## 2020-03-23 MED ORDER — FLUOROURACIL CHEMO INJECTION 2.5 GM/50ML
400.0000 mg/m2 | Freq: Once | INTRAVENOUS | Status: AC
  Administered 2020-03-23: 900 mg via INTRAVENOUS
  Filled 2020-03-23: qty 18

## 2020-03-23 MED ORDER — SODIUM CHLORIDE 0.9% FLUSH
10.0000 mL | INTRAVENOUS | Status: DC | PRN
  Administered 2020-03-23: 10 mL
  Filled 2020-03-23: qty 10

## 2020-03-23 MED ORDER — SODIUM CHLORIDE 0.9 % IV SOLN
10.0000 mg | Freq: Once | INTRAVENOUS | Status: AC
  Administered 2020-03-23: 10 mg via INTRAVENOUS
  Filled 2020-03-23: qty 10

## 2020-03-23 MED ORDER — HEPARIN SOD (PORK) LOCK FLUSH 100 UNIT/ML IV SOLN
500.0000 [IU] | Freq: Once | INTRAVENOUS | Status: DC | PRN
  Filled 2020-03-23: qty 5

## 2020-03-23 MED ORDER — PALONOSETRON HCL INJECTION 0.25 MG/5ML
INTRAVENOUS | Status: AC
  Filled 2020-03-23: qty 5

## 2020-03-23 MED ORDER — LEUCOVORIN CALCIUM INJECTION 350 MG
400.0000 mg/m2 | Freq: Once | INTRAVENOUS | Status: AC
  Administered 2020-03-23: 904 mg via INTRAVENOUS
  Filled 2020-03-23: qty 45.2

## 2020-03-23 MED ORDER — SODIUM CHLORIDE 0.9 % IV SOLN
2400.0000 mg/m2 | INTRAVENOUS | Status: DC
  Administered 2020-03-23: 5400 mg via INTRAVENOUS
  Filled 2020-03-23: qty 108

## 2020-03-23 NOTE — Progress Notes (Signed)
STAT CT angio chest scheduled for tomorrow.  Reviewed instructions and time with Ms Santiesteban.  She verbalized understanding.

## 2020-03-23 NOTE — Patient Instructions (Signed)
[facility] Discharge Instructions for Patients Receiving Chemotherapy  Today you received the following chemotherapy agents: oxaliplatin, leucovorin, and fluorouracil.  To help prevent nausea and vomiting after your treatment, we encourage you to take your nausea medication as directed.   If you develop nausea and vomiting that is not controlled by your nausea medication, call the clinic.   BELOW ARE SYMPTOMS THAT SHOULD BE REPORTED IMMEDIATELY:  *FEVER GREATER THAN 100.5 F  *CHILLS WITH OR WITHOUT FEVER  NAUSEA AND VOMITING THAT IS NOT CONTROLLED WITH YOUR NAUSEA MEDICATION  *UNUSUAL SHORTNESS OF BREATH  *UNUSUAL BRUISING OR BLEEDING  TENDERNESS IN MOUTH AND THROAT WITH OR WITHOUT PRESENCE OF ULCERS  *URINARY PROBLEMS  *BOWEL PROBLEMS  UNUSUAL RASH Items with * indicate a potential emergency and should be followed up as soon as possible.  Feel free to call the clinic should you have any questions or concerns. The clinic phone number is [phone].  Please show the CHEMO ALERT CARD at check-in to the Emergency Department and triage nurse.   

## 2020-03-24 ENCOUNTER — Ambulatory Visit (HOSPITAL_COMMUNITY)
Admission: RE | Admit: 2020-03-24 | Discharge: 2020-03-24 | Disposition: A | Discharge status: Home / Self Care | Payer: Medicare Other | Source: Ambulatory Visit | Attending: Hematology | Admitting: Hematology

## 2020-03-24 ENCOUNTER — Encounter (HOSPITAL_COMMUNITY): Payer: Self-pay

## 2020-03-24 DIAGNOSIS — R079 Chest pain, unspecified: Secondary | ICD-10-CM | POA: Diagnosis not present

## 2020-03-24 DIAGNOSIS — R0602 Shortness of breath: Secondary | ICD-10-CM | POA: Diagnosis not present

## 2020-03-24 DIAGNOSIS — I2699 Other pulmonary embolism without acute cor pulmonale: Secondary | ICD-10-CM | POA: Diagnosis not present

## 2020-03-24 DIAGNOSIS — E041 Nontoxic single thyroid nodule: Secondary | ICD-10-CM | POA: Diagnosis not present

## 2020-03-24 MED ORDER — IOHEXOL 350 MG/ML SOLN
100.0000 mL | Freq: Once | INTRAVENOUS | Status: AC | PRN
  Administered 2020-03-24: 73 mL via INTRAVENOUS

## 2020-03-25 ENCOUNTER — Other Ambulatory Visit: Payer: Self-pay

## 2020-03-25 ENCOUNTER — Inpatient Hospital Stay: Payer: Medicare Other

## 2020-03-25 ENCOUNTER — Telehealth: Payer: Self-pay | Admitting: Hematology

## 2020-03-25 VITALS — BP 130/52 | HR 69 | Temp 98.6°F | Resp 20

## 2020-03-25 DIAGNOSIS — K219 Gastro-esophageal reflux disease without esophagitis: Secondary | ICD-10-CM | POA: Diagnosis not present

## 2020-03-25 DIAGNOSIS — R0981 Nasal congestion: Secondary | ICD-10-CM | POA: Diagnosis not present

## 2020-03-25 DIAGNOSIS — E119 Type 2 diabetes mellitus without complications: Secondary | ICD-10-CM | POA: Diagnosis not present

## 2020-03-25 DIAGNOSIS — Z86711 Personal history of pulmonary embolism: Secondary | ICD-10-CM | POA: Diagnosis not present

## 2020-03-25 DIAGNOSIS — C182 Malignant neoplasm of ascending colon: Secondary | ICD-10-CM | POA: Diagnosis not present

## 2020-03-25 DIAGNOSIS — R918 Other nonspecific abnormal finding of lung field: Secondary | ICD-10-CM | POA: Diagnosis not present

## 2020-03-25 DIAGNOSIS — R059 Cough, unspecified: Secondary | ICD-10-CM | POA: Diagnosis not present

## 2020-03-25 DIAGNOSIS — M1990 Unspecified osteoarthritis, unspecified site: Secondary | ICD-10-CM | POA: Diagnosis not present

## 2020-03-25 DIAGNOSIS — Z7901 Long term (current) use of anticoagulants: Secondary | ICD-10-CM | POA: Diagnosis not present

## 2020-03-25 DIAGNOSIS — E559 Vitamin D deficiency, unspecified: Secondary | ICD-10-CM | POA: Diagnosis not present

## 2020-03-25 DIAGNOSIS — Z86000 Personal history of in-situ neoplasm of breast: Secondary | ICD-10-CM | POA: Diagnosis not present

## 2020-03-25 DIAGNOSIS — Z5111 Encounter for antineoplastic chemotherapy: Secondary | ICD-10-CM | POA: Diagnosis not present

## 2020-03-25 DIAGNOSIS — I10 Essential (primary) hypertension: Secondary | ICD-10-CM | POA: Diagnosis not present

## 2020-03-25 DIAGNOSIS — R0602 Shortness of breath: Secondary | ICD-10-CM | POA: Diagnosis not present

## 2020-03-25 DIAGNOSIS — D509 Iron deficiency anemia, unspecified: Secondary | ICD-10-CM | POA: Diagnosis not present

## 2020-03-25 DIAGNOSIS — Z5189 Encounter for other specified aftercare: Secondary | ICD-10-CM | POA: Diagnosis not present

## 2020-03-25 MED ORDER — PEGFILGRASTIM-CBQV 6 MG/0.6ML ~~LOC~~ SOSY
PREFILLED_SYRINGE | SUBCUTANEOUS | Status: AC
  Filled 2020-03-25: qty 0.6

## 2020-03-25 MED ORDER — PEGFILGRASTIM-CBQV 6 MG/0.6ML ~~LOC~~ SOSY
6.0000 mg | PREFILLED_SYRINGE | Freq: Once | SUBCUTANEOUS | Status: AC
  Administered 2020-03-25: 6 mg via SUBCUTANEOUS

## 2020-03-25 MED ORDER — HEPARIN SOD (PORK) LOCK FLUSH 100 UNIT/ML IV SOLN
500.0000 [IU] | Freq: Once | INTRAVENOUS | Status: AC | PRN
  Administered 2020-03-25: 500 [IU]
  Filled 2020-03-25: qty 5

## 2020-03-25 MED ORDER — SODIUM CHLORIDE 0.9% FLUSH
10.0000 mL | INTRAVENOUS | Status: DC | PRN
  Administered 2020-03-25: 10 mL
  Filled 2020-03-25: qty 10

## 2020-03-25 NOTE — Telephone Encounter (Signed)
No 11/22 los. No changes made to pt's schedule.

## 2020-04-03 NOTE — Progress Notes (Signed)
[place]   Telephone:(336) [phone] Fax:(336) [phone]   Clinic Follow up Note   Patient Care Team: Hulan Fess, MD as PCP - General (Family Medicine) Excell Seltzer, MD (Inactive) as Consulting Physician (General Surgery) Rolm Bookbinder, MD as Consulting Physician (General Surgery) Thea Silversmith, MD as Consulting Physician (Radiation Oncology) Holley Bouche, NP (Inactive) as Nurse Practitioner (Nurse Practitioner) Truitt Merle, MD as Consulting Physician (Hematology) Jonnie Finner, RN as Oncology Nurse Navigator Leighton Ruff, MD as Consulting Physician (General Surgery) Nicholas Lose, MD as Consulting Physician (Hematology and Oncology) Leighton Ruff, MD as Consulting Physician (General Surgery)  Date of Service:  04/06/2020  CHIEF COMPLAINT: F/u of colon cancer, B/l PE  SUMMARY OF ONCOLOGIC HISTORY: Oncology History Overview Note  Cancer Staging Breast cancer of upper-outer quadrant of left female breast [facility]) Staging form: Breast, AJCC 7th Edition - Clinical stage from 06/11/2014: Stage 0 (Tis (DCIS), N0, M0) - Unsigned - Pathologic stage from 07/03/2014: Stage Unknown (Tis (DCIS), NX, cM0) - Signed by Enid Cutter, MD on 07/10/2014 Staging comments: Staged on final lumpectomy specimen by Dr. Donato Heinz.  right colon cancer Staging form: Colon and Rectum, AJCC 8th Edition - Pathologic stage from 11/01/2019: Stage IIIB (pT3, pN2a, cM0) - Signed by Truitt Merle, MD on 11/29/2019    Malignant neoplasm of female breast ([place])  05/29/2014 Initial Biopsy   Left breast needle core biopsy: Grade 2, DCIS with calcs. ER+ (100%), PR+ (96%).    06/04/2014 Initial Diagnosis   Left breast DCIS with calcifications, ER 100%, PR 96%   06/10/2014 Breast MRI   Left breast: 2.4 x 1.3 x 1.1 cm area of patchy non-mass enhancement upper outer quadrant includes postbiopsy seroma; Right breast: 1.2 cm previously biopsied stable benign fibroadenoma   06/12/2014 Procedure    Genetic counseling/testing: Identified 1 VUS on CHEK2 gene. Remainder of 17 gene panel tested negative and included: ATM, BARD1, BRCA 1/2, BRIP1, CDH1, CHEK2, EPCAM, MLH1, MSH2, MSH6, NBN, NF1, PALB2, PTEN, RAD50, RAD51C, RAD51D, STK11, and TP53.    07/01/2014 Surgery   Left breast lumpectomy ([place]): Grade 1, DCIS, spanning 2.3 cm, 1 mm margin, ER 100%, PR 96%   07/31/2014 - 08/28/2014 Radiation Therapy   Adjuvant RT completed Pablo Ledger). Left breast: Total dose 42.5 Gy over 17 fractions. Left breast boost: Total dose 7.5 Gy over 3 fractions.    09/14/2014 - 01/13/2020 Anti-estrogen oral therapy   Anastrazole $RemoveBefo'1mg'TnAPSVSlJZI$  daily. Planned duration of treatment: 5 years [place]). Completed in 01/2020.    09/25/2014 Survivorship   Survivorship Care Plan given to patient and reviewed with her in person.    right colon cancer  09/19/2019 Imaging   CT AP W contrast 09/19/19  IMPRESSION Fullness in the cecum, cannot exclude a mass. No evidence for metastatic disease is identified.    09/24/2019 Procedure   Colonoscopy by Dr Eber Jones 09/24/19 IMPRESSION 1. The colon was redundant  2. Mild diverticulosis was noted through the entire examined colon 3. Single 65mm polyp was found in the ascending colon; polypectomy was performed using snare cautery and biopsy forceps 4. Mild diverticulosis was notes in the descending colon and sigmoid colon.  5. Single polyp was found in the sigmoid colon, polypectomy was performed with cold forceps.  6. Single polyp was found in the rectosigmoid colon; polypectomy was performed with cold snare  7. Small internal hemorrhoids  8. Large mass was found at the cecum; multiple biopsies of the area were performed using cold forceps; injection (tattooing) was performed distal to  the mass.    09/24/2019 Initial Biopsy   INTERPRETATION AND DIAGNOSIS:  A. Cecum, biopsy:  Invasive moderately differentiated adenocarcinoma.  see comment  B. Polyp @ ascending colon, polypectomy:  Tubular  Adenoma  C. Polyp @ sigmoid colon Polypectomy:  hyperplastic polyp.  D. Polyp @ rectosigmoid colon, Polypectomy:  Hyperplastic Polyp      10/16/2019 Imaging   CT Chest IMPRESSION: 1. Multiple small pulmonary nodules measuring 5 mm or less in size in the lungs. These are nonspecific and are typically considered statistically likely benign. However, given the patient's history of primary malignancy, close attention on follow-up studies is recommended to ensure stability. 2. Aortic atherosclerosis, in addition to right coronary artery disease. Assessment for potential risk factor modification, dietary therapy or pharmacologic therapy may be warranted, if clinically indicated. 3. There are calcifications of the aortic valve and mitral annulus. Echocardiographic correlation for evaluation of potential valvular dysfunction may be warranted if clinically indicated. 4. Small hiatal hernia.   Aortic Atherosclerosis (ICD10-I70.0).   11/01/2019 Initial Diagnosis   Colon cancer ([place])   11/01/2019 Surgery   LAPAROSCOPIC PARTIAL COLECTOMY by Dr Marcello Moores and Dr Johney Maine   11/01/2019 Pathology Results   FINAL MICROSCOPIC DIAGNOSIS:   A. COLON, PROXIMAL RIGHT, COLECTOMY:  - Invasive colonic adenocarcinoma, 5 cm.  - Tumor invades through the muscularis propria into pericolonic tissues.   - Margins of resection are not involved.  - Metastatic carcinoma in (5) of (13) lymph nodes.  - See oncology table.    MSI Stable  Mismatch repair normal  MLH1 - Preserved nuclear expression (greater 50% tumor expression) MSH2 - Preserved nuclear expression (greater 50% tumor expression) MSH6 - Preserved nuclear expression (greater 50% tumor expression) PMS2 - Preserved nuclear expression (greater 50% tumor expression)   11/01/2019 Cancer Staging   Staging form: Colon and Rectum, AJCC 8th Edition - Pathologic stage from 11/01/2019: Stage IIIB (pT3, pN2a, cM0) - Signed by Truitt Merle, MD on 11/29/2019   12/10/2019  Procedure   PAC placed 12/10/19   12/17/2019 -  Chemotherapy   FOLFOX q2weeks starting in 2 weeks starting 12/17/19      CURRENT THERAPY:  FOLFOX q2weeks starting in 2 weeksstarting 12/17/19. Held 01/27/20-02/10/20 due to b/l PE.   INTERVAL HISTORY:  Kim Castro is here for a follow up. She presents to the clinic alone. She notes she is doing well. She notes her last cycle went well. Her cold sensitivity lasted longer in her in her left hand. She notes minimal numbness in the left hand but overall decreased sensation. She notes for the past 6 weeks she has cough and runny nose. She coughs first in the morning and in the evening.     REVIEW OF SYSTEMS:   Constitutional: Denies fevers, chills or abnormal weight loss Eyes: Denies blurriness of vision Ears, nose, mouth, throat, and face: Denies mucositis or sore throat Respiratory:  (+) Mild cough (+) SOB  Cardiovascular: Denies palpitation, chest discomfort or lower extremity swelling Gastrointestinal:  Denies nausea, heartburn or change in bowel habits Skin: Denies abnormal skin rashes Lymphatics: Denies new lymphadenopathy or easy bruising Neurological: (+) Mild numbness in hands, L>R (+) Cold sensitivity  Behavioral/Psych: Mood is stable, no new changes  All other systems were reviewed with the patient and are negative.  MEDICAL HISTORY:  Past Medical History:  Diagnosis Date  . Aortic atherosclerosis ([place])   . Arthritis    feet, lower back  . Basal cell carcinoma    arm  . Breast  cancer of upper-outer quadrant of left female breast ([place]) 06/04/2014  . Cataract    immature on the left  . Colon cancer ([place]) 08/2019  . Diabetes mellitus without complication ([place])   . Diverticulosis   . Dizziness    > 51yrs ago;took Antivert   . Family history of anesthesia complication    sister slow to wake up with anesthesia  . Family history of breast cancer   . Family history of colon cancer   . Family history of uterine cancer    . GERD (gastroesophageal reflux disease)    takes occasional TUMs  . History of bronchitis    > 8yrs ago  . History of colon polyps   . History of hiatal hernia    Small noted on CT  . Hypertension    takes Losartan daily and HCTZ  . Iron deficiency anemia   . Joint pain   . Numbness    to toes on each foot  . Peripheral neuropathy    feet and toes  . Personal history of radiation therapy   . Pulmonary nodules    Noted on CT  . Radiation 07/31/14-08/28/14   Left Breast 20 fxs  . Seasonal allergies    takes Claritin prn  . Urinary frequency   . Vitamin D deficiency    takes VIt D daily    SURGICAL HISTORY: Past Surgical History:  Procedure Laterality Date  . BREAST BIOPSY Bilateral   . BREAST LUMPECTOMY Left   . BREAST LUMPECTOMY WITH RADIOACTIVE SEED LOCALIZATION Left 07/01/2014   Procedure: LEFT BREAST LUMPECTOMY WITH RADIOACTIVE SEED LOCALIZATION;  Surgeon: Excell Seltzer, MD;  Location: [facility];  Service: General;  Laterality: Left;  . CATARACT EXTRACTION Right   . COLONOSCOPY    . LAPAROSCOPIC PARTIAL COLECTOMY N/A 11/01/2019   Procedure: LAPAROSCOPIC PARTIAL COLECTOMY;  Surgeon: Leighton Ruff, MD;  Location: [facility];  Service: General;  Laterality: N/A;  . PORTACATH PLACEMENT N/A 12/10/2019   Procedure: INSERTION PORT-A-CATH ULTRASOUND GUIDED IN RIGHT IJ;  Surgeon: Leighton Ruff, MD;  Location: [facility];  Service: General;  Laterality: N/A;  . TOTAL KNEE ARTHROPLASTY Left 10/24/2012   Procedure: TOTAL KNEE ARTHROPLASTY;  Surgeon: Kerin Salen, MD;  Location: [facility];  Service: Orthopedics;  Laterality: Left;  . TOTAL KNEE ARTHROPLASTY Right 01/09/2013   Procedure: TOTAL KNEE ARTHROPLASTY;  Surgeon: Kerin Salen, MD;  Location: [place];  Service: Orthopedics;  Laterality: Right;  . TUBAL LIGATION      I have reviewed the social history and family history with the patient and they are unchanged from previous note.  ALLERGIES:  is allergic to  oxycodone.  MEDICATIONS:  Current Outpatient Medications  Medication Sig Dispense Refill  . acetaminophen (TYLENOL) 500 MG tablet Take 2 tablets (1,000 mg total) by mouth every 6 (six) hours as needed. (Patient not taking: Reported on 12/03/2019) 30 tablet 0  . amoxicillin-clavulanate (AUGMENTIN) 875-125 MG tablet Take 1 tablet by mouth 2 (two) times daily. 14 tablet 0  . apixaban (ELIQUIS) 5 MG TABS tablet Take 2 tablets (10 mg total) by mouth 2 (two) times daily. Take 2 tablets ($RemoveBe'10mg'PRLLoIiXP$ ) 2 times daily x 7 days, then 1 tablet ($RemoveB'5mg'DALXddzj$ ) 2 times daily 70 tablet 2  . Cholecalciferol (VITAMIN D) 2000 UNITS CAPS Take 2,000 Units by mouth daily.     . Ferrous Sulfate (SLOW FE PO) Take 1 tablet by mouth in the morning and at bedtime.     . furosemide (LASIX) 20  MG tablet Take 1 tablet by mouth once daily 30 tablet 0  . ibuprofen (ADVIL) 200 MG tablet Take 400 mg by mouth every 6 (six) hours as needed for moderate pain.     Marland Kitchen lidocaine-prilocaine (EMLA) cream Apply to affected area once (Patient taking differently: Apply 1 application topically daily as needed (port access). ) 30 g 3  . loratadine (CLARITIN) 10 MG tablet Take 10 mg by mouth daily as needed for allergies.    Marland Kitchen losartan (COZAAR) 25 MG tablet Take 25 mg by mouth daily.    . metFORMIN (GLUCOPHAGE) 500 MG tablet Take 1 tablet (500 mg total) by mouth daily with breakfast. (Patient taking differently: Take 500 mg by mouth in the morning and at bedtime. )    . Multiple Vitamins-Minerals (MULTIVITAMIN WITH MINERALS) tablet Take 1 tablet by mouth daily.    Marland Kitchen omeprazole (PRILOSEC) 20 MG capsule Take 20 mg by mouth daily before breakfast.     . ondansetron (ZOFRAN) 8 MG tablet Take 1 tablet (8 mg total) by mouth 2 (two) times daily as needed for refractory nausea / vomiting. Start on day 3 after chemotherapy. 30 tablet 1  . potassium chloride (KLOR-CON) 10 MEQ tablet Take 1 tablet (10 mEq total) by mouth 2 (two) times daily. 60 tablet 1  . pravastatin  (PRAVACHOL) 10 MG tablet Take 10 mg by mouth daily.    . prochlorperazine (COMPAZINE) 10 MG tablet Take 1 tablet (10 mg total) by mouth every 6 (six) hours as needed (Nausea or vomiting). 30 tablet 1   No current facility-administered medications for this visit.    PHYSICAL EXAMINATION: ECOG PERFORMANCE STATUS: 2 - Symptomatic, <50% confined to bed  Vitals:   04/06/20 1110  BP: (!) 143/60  Pulse: 77  Resp: 15  Temp: 97.9 F (36.6 C)  SpO2: 98%   Filed Weights   04/06/20 1110  Weight: 234 lb 8 oz (106.4 kg)    GENERAL:alert, no distress and comfortable SKIN: skin color, texture, turgor are normal, no rashes or significant lesions EYES: normal, Conjunctiva are pink and non-injected, sclera clear  NECK: supple, thyroid normal size, non-tender, without nodularity LYMPH:  no palpable lymphadenopathy in the cervical, axillary  LUNGS: clear to auscultation and percussion with normal breathing effort HEART: regular rate & rhythm and no murmurs and no lower extremity edema ABDOMEN:abdomen soft, non-tender and normal bowel sounds Musculoskeletal:no cyanosis of digits and no clubbing  NEURO: alert & oriented x 3 with fluent speech, (+) moderate decreased sensory deficits in upper extremity, L>R.   LABORATORY DATA:  I have reviewed the data as listed CBC Latest Ref Rng & Units 03/23/2020 03/09/2020 02/24/2020  WBC 4.0 - 10.5 K/uL 14.8(H) 18.9(H) 12.7(H)  Hemoglobin 12.0 - 15.0 g/dL 10.5(L) 10.2(L) 11.2(L)  Hematocrit 36 - 46 % 32.6(L) 31.7(L) 34.3(L)  Platelets 150 - 400 K/uL 169 175 199     CMP Latest Ref Rng & Units 03/23/2020 03/09/2020 02/24/2020  Glucose 70 - 99 mg/dL 129(H) 154(H) 120(H)  BUN 8 - 23 mg/dL $Remove'10 12 9  'KutUsQy$ Creatinine 0.44 - 1.00 mg/dL 0.81 0.81 0.79  Sodium 135 - 145 mmol/L 144 145 142  Potassium 3.5 - 5.1 mmol/L 3.4(L) 3.4(L) 3.0(LL)  Chloride 98 - 111 mmol/L 107 108 105  CO2 22 - 32 mmol/L $RemoveB'24 26 28  'IOauUDjE$ Calcium 8.9 - 10.3 mg/dL 9.3 8.7(L) 9.5  Total Protein 6.5 -  8.1 g/dL 7.3 6.8 7.0  Total Bilirubin 0.3 - 1.2 mg/dL 1.1 0.9 0.9  Alkaline  Phos 38 - 126 U/L 175(H) 163(H) 138(H)  AST 15 - 41 U/L 36 66(H) 50(H)  ALT 0 - 44 U/L 28 47(H) 32      RADIOGRAPHIC STUDIES: I have personally reviewed the radiological images as listed and agreed with the findings in the report. No results found.   ASSESSMENT & PLAN:  Kim Castro is a 74 y.o. female with   1.Right colon cancer, pT3N2aM0 stage IIB, MSS -She was diagnosed in 08/2019 withcecal mass biopsy showed moderately differentiatedadenocarcinoma.CT chest from 10/16/19 did shows multiple small lung nodules that were nonspecific but likely benign. Will monitor.  -She underwent colon surgery with Dr Marcello Moores on 11/01/19.Path showed overall Stage IIIB cancer. -I started her onadjuvant chemotherapy for 6 months to reduce her risk of recurrencewith FOLFOXq2weeks beginning 12/17/19.Based on neuropathy,Icanswitch her 5FU to Oral Xeloda single agent. FOLFOX held 01/27/20-02/10/20 due to b/l PE. I discussed this was likely related to her chemo. -S/p C7 she has prolonged symptoms, mainly with numbness in hands, L>R. She has prolonged neuropathy, but does improve. Will further dose reduce oxaliplatin and will likely hold oxaliplatin with last few infusions.  -Labs results are still pending, if adequate will proceed with C8 FOLFOX today with dose reduced oxaliplatin further to $RemoveBe'50mg'lcLrGKJMt$ /m2 due to her neuropathy.  -Plan to rescan her after she complete adjuvant chemo.  -F/u in 2 weeks.    2.Iron deficient anemia -Her 10/03/19 Iron panel showed, Ferritin 13, Iron 34, sat ratios 8. Overall consistent with iron deficiency. -She did require blood transfusion on 11/06/19 after colon surgery. -Currently on2 tabs Slow Fe iron daily -Anemia mild and stable lately.  3.H/o left breast DCIS, G2, ER/PR+ -Dx in 05/2014. Treated with left lumpectomy with Dr Excell Seltzer, adjuvant RT with Dr Pablo Ledger. She has been on  Anastrozole 08/2014-01/13/20.She waspreviouslyunder the care ofDr Gudena.  -Bone density 06/09/2015 T score +1.1 normal. Plan to repeat DEXA in 2021. -I will take over her breast cancer care given she is on long term surveillance.  4.Submassive bilateral pulmonary embolism, LE Edema -S/p C3 chemo, her 9/27/21CTA showed PE with right heart strain.She also has bilateral lower extremity edemaand Doppler ultrasound was negative for DVT bilaterally.She was treated withHeparin and she has been transition[facility] to [facility].She notes $75 copay with Eliquis but can manage for now.  -I discussed given her blood clots were provoked plan to stop Eliquis after 6 months of treatment. If she hasrecurrentblood clotin future, I would recommend continuinganticoagulation indefinitely -She has been on Lasix and oral potassium BID for LE Edema. If her LE improves or resolves she can reduce or stop lasix and potassium, then use as needed. -SOB is likely accumulated effects from chemo. Will monitor.   5. Comorbidities: Arthritis, DM, HTN, GERD -f/u with PCP Dr. Rex Kras   PLAN: -I refilled her oral potassium today  -If lab adequate, will proceed with C8FOLFOX today with Oxaliplatin dose reduction to $RemoveBefo'50mg'ofvkbxSYYli$ /m2 due to her neuropathy.  -Lab, flush, f/u and FOLFOX in 2, 4, 6, 8 weeks -Continue Eliquis   No problem-specific Assessment & Plan notes found for this encounter.   No orders of the defined types were placed in this encounter.  All questions were answered. The patient knows to call the clinic with any problems, questions or concerns. No barriers to learning was detected. The total time spent in the appointment was 30 minutes.     Truitt Merle, MD 04/06/2020   I, Joslyn Devon, am acting as scribe for Truitt Merle, MD.   I  have reviewed the above documentation for accuracy and completeness, and I agree with the above.

## 2020-04-06 ENCOUNTER — Inpatient Hospital Stay: Payer: Medicare Other

## 2020-04-06 ENCOUNTER — Inpatient Hospital Stay: Payer: Medicare Other | Attending: Hematology

## 2020-04-06 ENCOUNTER — Encounter: Payer: Self-pay | Admitting: Hematology

## 2020-04-06 ENCOUNTER — Inpatient Hospital Stay: Payer: Medicare Other | Admitting: Hematology

## 2020-04-06 ENCOUNTER — Other Ambulatory Visit: Payer: Self-pay

## 2020-04-06 VITALS — BP 143/60 | HR 77 | Temp 97.9°F | Resp 15 | Ht 66.0 in | Wt 234.5 lb

## 2020-04-06 DIAGNOSIS — C182 Malignant neoplasm of ascending colon: Secondary | ICD-10-CM | POA: Insufficient documentation

## 2020-04-06 DIAGNOSIS — Z853 Personal history of malignant neoplasm of breast: Secondary | ICD-10-CM | POA: Insufficient documentation

## 2020-04-06 DIAGNOSIS — E119 Type 2 diabetes mellitus without complications: Secondary | ICD-10-CM | POA: Diagnosis not present

## 2020-04-06 DIAGNOSIS — Z86711 Personal history of pulmonary embolism: Secondary | ICD-10-CM | POA: Insufficient documentation

## 2020-04-06 DIAGNOSIS — G620 Drug-induced polyneuropathy: Secondary | ICD-10-CM | POA: Insufficient documentation

## 2020-04-06 DIAGNOSIS — Z170 Estrogen receptor positive status [ER+]: Secondary | ICD-10-CM

## 2020-04-06 DIAGNOSIS — C50919 Malignant neoplasm of unspecified site of unspecified female breast: Secondary | ICD-10-CM | POA: Diagnosis not present

## 2020-04-06 DIAGNOSIS — Z95828 Presence of other vascular implants and grafts: Secondary | ICD-10-CM

## 2020-04-06 DIAGNOSIS — I10 Essential (primary) hypertension: Secondary | ICD-10-CM | POA: Diagnosis not present

## 2020-04-06 DIAGNOSIS — Z79899 Other long term (current) drug therapy: Secondary | ICD-10-CM | POA: Diagnosis not present

## 2020-04-06 DIAGNOSIS — Z5189 Encounter for other specified aftercare: Secondary | ICD-10-CM | POA: Diagnosis not present

## 2020-04-06 DIAGNOSIS — T451X5A Adverse effect of antineoplastic and immunosuppressive drugs, initial encounter: Secondary | ICD-10-CM | POA: Insufficient documentation

## 2020-04-06 DIAGNOSIS — D509 Iron deficiency anemia, unspecified: Secondary | ICD-10-CM | POA: Diagnosis not present

## 2020-04-06 DIAGNOSIS — Z7901 Long term (current) use of anticoagulants: Secondary | ICD-10-CM | POA: Diagnosis not present

## 2020-04-06 DIAGNOSIS — Z5111 Encounter for antineoplastic chemotherapy: Secondary | ICD-10-CM | POA: Diagnosis not present

## 2020-04-06 LAB — CMP (CANCER CENTER ONLY)
ALT: 33 U/L (ref 0–44)
AST: 43 U/L — ABNORMAL HIGH (ref 15–41)
Albumin: 3.5 g/dL (ref 3.5–5.0)
Alkaline Phosphatase: 182 U/L — ABNORMAL HIGH (ref 38–126)
Anion gap: 9 (ref 5–15)
BUN: 13 mg/dL (ref 8–23)
CO2: 26 mmol/L (ref 22–32)
Calcium: 9.5 mg/dL (ref 8.9–10.3)
Chloride: 107 mmol/L (ref 98–111)
Creatinine: 0.99 mg/dL (ref 0.44–1.00)
GFR, Estimated: >60 mL/min (ref >60)
Glucose, Bld: 198 mg/dL — ABNORMAL HIGH (ref 70–99)
Potassium: 3.5 mmol/L (ref 3.5–5.1)
Sodium: 142 mmol/L (ref 135–145)
Total Bilirubin: 0.9 mg/dL (ref 0.3–1.2)
Total Protein: 7.1 g/dL (ref 6.5–8.1)

## 2020-04-06 LAB — CBC WITH DIFFERENTIAL (CANCER CENTER ONLY)
Abs Immature Granulocytes: 0.75 10*3/uL — ABNORMAL HIGH (ref 0.00–0.07)
Basophils Absolute: 0.1 10*3/uL (ref 0.0–0.1)
Basophils Relative: 1 %
Eosinophils Absolute: 0.1 10*3/uL (ref 0.0–0.5)
Eosinophils Relative: 1 %
HCT: 29.7 % — ABNORMAL LOW (ref 36.0–46.0)
Hemoglobin: 9.6 g/dL — ABNORMAL LOW (ref 12.0–15.0)
Immature Granulocytes: 9 %
Lymphocytes Relative: 30 %
Lymphs Abs: 2.6 10*3/uL (ref 0.7–4.0)
MCH: 34 pg (ref 26.0–34.0)
MCHC: 32.3 g/dL (ref 30.0–36.0)
MCV: 105.3 fL — ABNORMAL HIGH (ref 80.0–100.0)
Monocytes Absolute: 0.8 10*3/uL (ref 0.1–1.0)
Monocytes Relative: 10 %
Neutro Abs: 4.4 10*3/uL (ref 1.7–7.7)
Neutrophils Relative %: 49 %
Platelet Count: 139 10*3/uL — ABNORMAL LOW (ref 150–400)
RBC: 2.82 MIL/uL — ABNORMAL LOW (ref 3.87–5.11)
RDW: 19.5 % — ABNORMAL HIGH (ref 11.5–15.5)
WBC Count: 8.6 10*3/uL (ref 4.0–10.5)
nRBC: 0.5 % — ABNORMAL HIGH (ref 0.0–0.2)

## 2020-04-06 MED ORDER — LEUCOVORIN CALCIUM INJECTION 350 MG
400.0000 mg/m2 | Freq: Once | INTRAVENOUS | Status: AC
  Administered 2020-04-06: 904 mg via INTRAVENOUS
  Filled 2020-04-06: qty 45.2

## 2020-04-06 MED ORDER — PALONOSETRON HCL INJECTION 0.25 MG/5ML
INTRAVENOUS | Status: AC
  Filled 2020-04-06: qty 5

## 2020-04-06 MED ORDER — OXALIPLATIN CHEMO INJECTION 100 MG/20ML
50.0000 mg/m2 | Freq: Once | INTRAVENOUS | Status: AC
  Administered 2020-04-06: 115 mg via INTRAVENOUS
  Filled 2020-04-06: qty 10

## 2020-04-06 MED ORDER — DEXTROSE 5 % IV SOLN
Freq: Once | INTRAVENOUS | Status: AC
  Filled 2020-04-06: qty 250

## 2020-04-06 MED ORDER — SODIUM CHLORIDE 0.9% FLUSH
10.0000 mL | INTRAVENOUS | Status: DC | PRN
  Administered 2020-04-06: 10 mL
  Filled 2020-04-06: qty 10

## 2020-04-06 MED ORDER — FLUOROURACIL CHEMO INJECTION 2.5 GM/50ML
400.0000 mg/m2 | Freq: Once | INTRAVENOUS | Status: AC
  Administered 2020-04-06: 900 mg via INTRAVENOUS
  Filled 2020-04-06: qty 18

## 2020-04-06 MED ORDER — SODIUM CHLORIDE 0.9 % IV SOLN
2400.0000 mg/m2 | INTRAVENOUS | Status: DC
  Administered 2020-04-06: 5400 mg via INTRAVENOUS
  Filled 2020-04-06: qty 108

## 2020-04-06 MED ORDER — POTASSIUM CHLORIDE ER 10 MEQ PO TBCR
10.0000 meq | EXTENDED_RELEASE_TABLET | Freq: Two times a day (BID) | ORAL | 1 refills | Status: DC
Start: 2020-04-06 — End: 2020-08-31

## 2020-04-06 MED ORDER — SODIUM CHLORIDE 0.9 % IV SOLN
10.0000 mg | Freq: Once | INTRAVENOUS | Status: AC
  Administered 2020-04-06: 10 mg via INTRAVENOUS
  Filled 2020-04-06: qty 10

## 2020-04-06 MED ORDER — PALONOSETRON HCL INJECTION 0.25 MG/5ML
0.2500 mg | Freq: Once | INTRAVENOUS | Status: AC
  Administered 2020-04-06: 0.25 mg via INTRAVENOUS

## 2020-04-06 NOTE — Patient Instructions (Signed)
[facility] Discharge Instructions for Patients Receiving Chemotherapy  Today you received the following chemotherapy agents: oxaliplatin/leucovorin/fluorouracil.  To help prevent nausea and vomiting after your treatment, we encourage you to take your nausea medication as directed.  If you develop nausea and vomiting that is not controlled by your nausea medication, call the clinic.   BELOW ARE SYMPTOMS THAT SHOULD BE REPORTED IMMEDIATELY:  *FEVER GREATER THAN 100.5 F  *CHILLS WITH OR WITHOUT FEVER  NAUSEA AND VOMITING THAT IS NOT CONTROLLED WITH YOUR NAUSEA MEDICATION  *UNUSUAL SHORTNESS OF BREATH  *UNUSUAL BRUISING OR BLEEDING  TENDERNESS IN MOUTH AND THROAT WITH OR WITHOUT PRESENCE OF ULCERS  *URINARY PROBLEMS  *BOWEL PROBLEMS  UNUSUAL RASH Items with * indicate a potential emergency and should be followed up as soon as possible.  Feel free to call the clinic should you have any questions or concerns. The clinic phone number is [phone].  Please show the CHEMO ALERT CARD at check-in to the Emergency Department and triage nurse.   

## 2020-04-06 NOTE — Progress Notes (Signed)
Patient completed infusion without incident. In no visible distress at time of discharge. Ambulated out of cancer center. AVS provided.  

## 2020-04-06 NOTE — Patient Instructions (Signed)

## 2020-04-08 ENCOUNTER — Inpatient Hospital Stay: Payer: Medicare Other

## 2020-04-08 ENCOUNTER — Other Ambulatory Visit: Payer: Self-pay

## 2020-04-08 ENCOUNTER — Telehealth: Payer: Self-pay | Admitting: Hematology

## 2020-04-08 VITALS — BP 135/56 | HR 70 | Temp 98.4°F | Resp 16

## 2020-04-08 DIAGNOSIS — Z79899 Other long term (current) drug therapy: Secondary | ICD-10-CM | POA: Diagnosis not present

## 2020-04-08 DIAGNOSIS — G620 Drug-induced polyneuropathy: Secondary | ICD-10-CM | POA: Diagnosis not present

## 2020-04-08 DIAGNOSIS — Z5111 Encounter for antineoplastic chemotherapy: Secondary | ICD-10-CM | POA: Diagnosis not present

## 2020-04-08 DIAGNOSIS — Z86711 Personal history of pulmonary embolism: Secondary | ICD-10-CM | POA: Diagnosis not present

## 2020-04-08 DIAGNOSIS — Z7901 Long term (current) use of anticoagulants: Secondary | ICD-10-CM | POA: Diagnosis not present

## 2020-04-08 DIAGNOSIS — I10 Essential (primary) hypertension: Secondary | ICD-10-CM | POA: Diagnosis not present

## 2020-04-08 DIAGNOSIS — Z5189 Encounter for other specified aftercare: Secondary | ICD-10-CM | POA: Diagnosis not present

## 2020-04-08 DIAGNOSIS — C182 Malignant neoplasm of ascending colon: Secondary | ICD-10-CM | POA: Diagnosis not present

## 2020-04-08 DIAGNOSIS — T451X5A Adverse effect of antineoplastic and immunosuppressive drugs, initial encounter: Secondary | ICD-10-CM | POA: Diagnosis not present

## 2020-04-08 DIAGNOSIS — E119 Type 2 diabetes mellitus without complications: Secondary | ICD-10-CM | POA: Diagnosis not present

## 2020-04-08 DIAGNOSIS — D509 Iron deficiency anemia, unspecified: Secondary | ICD-10-CM | POA: Diagnosis not present

## 2020-04-08 DIAGNOSIS — Z853 Personal history of malignant neoplasm of breast: Secondary | ICD-10-CM | POA: Diagnosis not present

## 2020-04-08 MED ORDER — PEGFILGRASTIM-CBQV 6 MG/0.6ML ~~LOC~~ SOSY
PREFILLED_SYRINGE | SUBCUTANEOUS | Status: AC
  Filled 2020-04-08: qty 0.6

## 2020-04-08 MED ORDER — SODIUM CHLORIDE 0.9% FLUSH
10.0000 mL | INTRAVENOUS | Status: DC | PRN
  Administered 2020-04-08: 10 mL
  Filled 2020-04-08: qty 10

## 2020-04-08 MED ORDER — HEPARIN SOD (PORK) LOCK FLUSH 100 UNIT/ML IV SOLN
500.0000 [IU] | Freq: Once | INTRAVENOUS | Status: AC | PRN
  Administered 2020-04-08: 500 [IU]
  Filled 2020-04-08: qty 5

## 2020-04-08 MED ORDER — PEGFILGRASTIM-CBQV 6 MG/0.6ML ~~LOC~~ SOSY
6.0000 mg | PREFILLED_SYRINGE | Freq: Once | SUBCUTANEOUS | Status: AC
  Administered 2020-04-08: 6 mg via SUBCUTANEOUS

## 2020-04-08 NOTE — Telephone Encounter (Signed)
Scheduled appts per 12/6 los. Pt to get updated appt calendar at next visit per appt notes.

## 2020-04-08 NOTE — Progress Notes (Signed)
Pump d/c appt. No complaints. Patient stable at discharge.

## 2020-04-18 ENCOUNTER — Other Ambulatory Visit: Payer: Self-pay | Admitting: Hematology

## 2020-04-19 NOTE — Progress Notes (Signed)
[place]   [phone] [phone]   Clinic Follow up Note   Patient Care Team: Hulan Fess, MD as PCP - General (Family Medicine) Excell Seltzer, MD (Inactive) as Consulting Physician (General Surgery) Rolm Bookbinder, MD as Consulting Physician (General Surgery) Thea Silversmith, MD as Consulting Physician (Radiation Oncology) Holley Bouche, NP (Inactive) as Nurse Practitioner (Nurse Practitioner) Truitt Merle, MD as Consulting Physician (Hematology) Jonnie Finner, RN as Oncology Nurse Navigator Leighton Ruff, MD as Consulting Physician (General Surgery) Nicholas Lose, MD as Consulting Physician (Hematology and Oncology) Leighton Ruff, MD as Consulting Physician (General Surgery) 04/20/2020  CHIEF COMPLAINT: Follow up colon cancer   SUMMARY OF ONCOLOGIC HISTORY: Oncology History Overview Note  Cancer Staging Breast cancer of upper-outer quadrant of left female breast [facility]) Staging form: Breast, AJCC 7th Edition - Clinical stage from 06/11/2014: Stage 0 (Tis (DCIS), N0, M0) - Unsigned - Pathologic stage from 07/03/2014: Stage Unknown (Tis (DCIS), NX, cM0) - Signed by Enid Cutter, MD on 07/10/2014 Staging comments: Staged on final lumpectomy specimen by Dr. Donato Heinz.  right colon cancer Staging form: Colon and Rectum, AJCC 8th Edition - Pathologic stage from 11/01/2019: Stage IIIB (pT3, pN2a, cM0) - Signed by Truitt Merle, MD on 11/29/2019    Malignant neoplasm of female breast ([place])  05/29/2014 Initial Biopsy   Left breast needle core biopsy: Grade 2, DCIS with calcs. ER+ (100%), PR+ (96%).    06/04/2014 Initial Diagnosis   Left breast DCIS with calcifications, ER 100%, PR 96%   06/10/2014 Breast MRI   Left breast: 2.4 x 1.3 x 1.1 cm area of patchy non-mass enhancement upper outer quadrant includes postbiopsy seroma; Right breast: 1.2 cm previously biopsied stable benign fibroadenoma   06/12/2014 Procedure   Genetic  counseling/testing: Identified 1 VUS on CHEK2 gene. Remainder of 17 gene panel tested negative and included: ATM, BARD1, BRCA 1/2, BRIP1, CDH1, CHEK2, EPCAM, MLH1, MSH2, MSH6, NBN, NF1, PALB2, PTEN, RAD50, RAD51C, RAD51D, STK11, and TP53.    07/01/2014 Surgery   Left breast lumpectomy ([place]): Grade 1, DCIS, spanning 2.3 cm, 1 mm margin, ER 100%, PR 96%   07/31/2014 - 08/28/2014 Radiation Therapy   Adjuvant RT completed Pablo Ledger). Left breast: Total dose 42.5 Gy over 17 fractions. Left breast boost: Total dose 7.5 Gy over 3 fractions.    09/14/2014 - 01/13/2020 Anti-estrogen oral therapy   Anastrazole $RemoveBefo'1mg'xAfaEAklMSG$  daily. Planned duration of treatment: 5 years [place]). Completed in 01/2020.    09/25/2014 Survivorship   Survivorship Care Plan given to patient and reviewed with her in person.    right colon cancer  09/19/2019 Imaging   CT AP W contrast 09/19/19  IMPRESSION Fullness in the cecum, cannot exclude a mass. No evidence for metastatic disease is identified.    09/24/2019 Procedure   Colonoscopy by Dr Eber Jones 09/24/19 IMPRESSION 1. The colon was redundant  2. Mild diverticulosis was noted through the entire examined colon 3. Single 19mm polyp was found in the ascending colon; polypectomy was performed using snare cautery and biopsy forceps 4. Mild diverticulosis was notes in the descending colon and sigmoid colon.  5. Single polyp was found in the sigmoid colon, polypectomy was performed with cold forceps.  6. Single polyp was found in the rectosigmoid colon; polypectomy was performed with cold snare  7. Small internal hemorrhoids  8. Large mass was found at the cecum; multiple biopsies of the area were performed using cold forceps; injection (tattooing) was performed distal to the mass.    09/24/2019  Initial Biopsy   INTERPRETATION AND DIAGNOSIS:  A. Cecum, biopsy:  Invasive moderately differentiated adenocarcinoma.  see comment  B. Polyp @ ascending colon, polypectomy:  Tubular Adenoma   C. Polyp @ sigmoid colon Polypectomy:  hyperplastic polyp.  D. Polyp @ rectosigmoid colon, Polypectomy:  Hyperplastic Polyp      10/16/2019 Imaging   CT Chest IMPRESSION: 1. Multiple small pulmonary nodules measuring 5 mm or less in size in the lungs. These are nonspecific and are typically considered statistically likely benign. However, given the patient's history of primary malignancy, close attention on follow-up studies is recommended to ensure stability. 2. Aortic atherosclerosis, in addition to right coronary artery disease. Assessment for potential risk factor modification, dietary therapy or pharmacologic therapy may be warranted, if clinically indicated. 3. There are calcifications of the aortic valve and mitral annulus. Echocardiographic correlation for evaluation of potential valvular dysfunction may be warranted if clinically indicated. 4. Small hiatal hernia.   Aortic Atherosclerosis (ICD10-I70.0).   11/01/2019 Initial Diagnosis   Colon cancer ([place])   11/01/2019 Surgery   LAPAROSCOPIC PARTIAL COLECTOMY by Dr Marcello Moores and Dr Johney Maine   11/01/2019 Pathology Results   FINAL MICROSCOPIC DIAGNOSIS:   A. COLON, PROXIMAL RIGHT, COLECTOMY:  - Invasive colonic adenocarcinoma, 5 cm.  - Tumor invades through the muscularis propria into pericolonic tissues.   - Margins of resection are not involved.  - Metastatic carcinoma in (5) of (13) lymph nodes.  - See oncology table.    MSI Stable  Mismatch repair normal  MLH1 - Preserved nuclear expression (greater 50% tumor expression) MSH2 - Preserved nuclear expression (greater 50% tumor expression) MSH6 - Preserved nuclear expression (greater 50% tumor expression) PMS2 - Preserved nuclear expression (greater 50% tumor expression)   11/01/2019 Cancer Staging   Staging form: Colon and Rectum, AJCC 8th Edition - Pathologic stage from 11/01/2019: Stage IIIB (pT3, pN2a, cM0) - Signed by Truitt Merle, MD on 11/29/2019   12/10/2019 Procedure    PAC placed 12/10/19   12/17/2019 -  Chemotherapy   FOLFOX q2weeks starting in 2 weeks starting 12/17/19     CURRENT THERAPY: FOLFOX q2weeks starting in 2 weeksstarting 12/17/19. Held 01/27/20-02/10/20 due to b/l PE.  INTERVAL HISTORY: Ms. Carmack returns for follow up as scheduled. She received cycle 8 adjuvant FOLFOX on 12/6. Oxaliplatin was further dose reduced for neuropathy.  She continues to have cold sensitivity and neuropathy more in the left hand than right.  She remains functional, not dropping things.  Otherwise no major changes.  Appetite is fair, energy wanes in the evening but she remains functional.  She does not think mouth sores are chemo related, she occasionally bites her cheek or tongue in her sleep.  She vomited once after eating [employer], her granddaughter also threw up after.  She has loose stools twice per day, takes Imodium as needed.  No blood in stools.  Denies any fever, chills, cough, chest pain, dyspnea or other new concerns.   MEDICAL HISTORY:  Past Medical History:  Diagnosis Date  . Aortic atherosclerosis ([place])   . Arthritis    feet, lower back  . Basal cell carcinoma    arm  . Breast cancer of upper-outer quadrant of left female breast ([place]) 06/04/2014  . Cataract    immature on the left  . Colon cancer ([place]) 08/2019  . Diabetes mellitus without complication ([place])   . Diverticulosis   . Dizziness    > 98yrs ago;took Antivert   . Family history of anesthesia complication  sister slow to wake up with anesthesia  . Family history of breast cancer   . Family history of colon cancer   . Family history of uterine cancer   . GERD (gastroesophageal reflux disease)    takes occasional TUMs  . History of bronchitis    > 73yrs ago  . History of colon polyps   . History of hiatal hernia    Small noted on CT  . Hypertension    takes Losartan daily and HCTZ  . Iron deficiency anemia   . Joint pain   . Numbness    to toes on each foot  . Peripheral neuropathy     feet and toes  . Personal history of radiation therapy   . Pulmonary nodules    Noted on CT  . Radiation 07/31/14-08/28/14   Left Breast 20 fxs  . Seasonal allergies    takes Claritin prn  . Urinary frequency   . Vitamin D deficiency    takes VIt D daily    SURGICAL HISTORY: Past Surgical History:  Procedure Laterality Date  . BREAST BIOPSY Bilateral   . BREAST LUMPECTOMY Left   . BREAST LUMPECTOMY WITH RADIOACTIVE SEED LOCALIZATION Left 07/01/2014   Procedure: LEFT BREAST LUMPECTOMY WITH RADIOACTIVE SEED LOCALIZATION;  Surgeon: Excell Seltzer, MD;  Location: [facility];  Service: General;  Laterality: Left;  . CATARACT EXTRACTION Right   . COLONOSCOPY    . LAPAROSCOPIC PARTIAL COLECTOMY N/A 11/01/2019   Procedure: LAPAROSCOPIC PARTIAL COLECTOMY;  Surgeon: Leighton Ruff, MD;  Location: [facility];  Service: General;  Laterality: N/A;  . PORTACATH PLACEMENT N/A 12/10/2019   Procedure: INSERTION PORT-A-CATH ULTRASOUND GUIDED IN RIGHT IJ;  Surgeon: Leighton Ruff, MD;  Location: [facility];  Service: General;  Laterality: N/A;  . TOTAL KNEE ARTHROPLASTY Left 10/24/2012   Procedure: TOTAL KNEE ARTHROPLASTY;  Surgeon: Kerin Salen, MD;  Location: [facility];  Service: Orthopedics;  Laterality: Left;  . TOTAL KNEE ARTHROPLASTY Right 01/09/2013   Procedure: TOTAL KNEE ARTHROPLASTY;  Surgeon: Kerin Salen, MD;  Location: [facility];  Service: Orthopedics;  Laterality: Right;  . TUBAL LIGATION      I have reviewed the social history and family history with the patient and they are unchanged from previous note.  ALLERGIES:  is allergic to oxycodone.  MEDICATIONS:  Current Outpatient Medications  Medication Sig Dispense Refill  . acetaminophen (TYLENOL) 500 MG tablet Take 2 tablets (1,000 mg total) by mouth every 6 (six) hours as needed. (Patient not taking: Reported on 12/03/2019) 30 tablet 0  . amoxicillin-clavulanate (AUGMENTIN) 875-125 MG tablet Take 1 tablet by mouth 2 (two) times  daily. 14 tablet 0  . apixaban (ELIQUIS) 5 MG TABS tablet Take 2 tablets (10 mg total) by mouth 2 (two) times daily. Take 2 tablets ($RemoveBe'10mg'TpkSObBvE$ ) 2 times daily x 7 days, then 1 tablet ($RemoveB'5mg'PbRNewHu$ ) 2 times daily 70 tablet 2  . Cholecalciferol (VITAMIN D) 2000 UNITS CAPS Take 2,000 Units by mouth daily.     . Ferrous Sulfate (SLOW FE PO) Take 1 tablet by mouth in the morning and at bedtime.     . furosemide (LASIX) 20 MG tablet Take 1 tablet by mouth once daily 30 tablet 0  . gabapentin (NEURONTIN) 100 MG capsule Take 1 capsule (100 mg total) by mouth at bedtime. 30 capsule 0  . ibuprofen (ADVIL) 200 MG tablet Take 400 mg by mouth every 6 (six) hours as needed for moderate pain.     Marland Kitchen lidocaine-prilocaine (  EMLA) cream Apply to affected area once (Patient taking differently: Apply 1 application topically daily as needed (port access). ) 30 g 3  . loratadine (CLARITIN) 10 MG tablet Take 10 mg by mouth daily as needed for allergies.    Marland Kitchen losartan (COZAAR) 25 MG tablet Take 25 mg by mouth daily.    . metFORMIN (GLUCOPHAGE) 500 MG tablet Take 1 tablet (500 mg total) by mouth daily with breakfast. (Patient taking differently: Take 500 mg by mouth in the morning and at bedtime. )    . Multiple Vitamins-Minerals (MULTIVITAMIN WITH MINERALS) tablet Take 1 tablet by mouth daily.    Marland Kitchen omeprazole (PRILOSEC) 20 MG capsule Take 20 mg by mouth daily before breakfast.     . ondansetron (ZOFRAN) 8 MG tablet Take 1 tablet (8 mg total) by mouth 2 (two) times daily as needed for refractory nausea / vomiting. Start on day 3 after chemotherapy. 30 tablet 1  . potassium chloride (KLOR-CON) 10 MEQ tablet Take 1 tablet (10 mEq total) by mouth 2 (two) times daily. 60 tablet 1  . pravastatin (PRAVACHOL) 10 MG tablet Take 10 mg by mouth daily.    . prochlorperazine (COMPAZINE) 10 MG tablet Take 1 tablet (10 mg total) by mouth every 6 (six) hours as needed (Nausea or vomiting). 30 tablet 1   No current facility-administered medications for  this visit.    PHYSICAL EXAMINATION: ECOG PERFORMANCE STATUS: 1 - Symptomatic but completely ambulatory  Vitals:   04/20/20 1144  BP: (!) 124/54  Pulse: 80  Resp: 18  Temp: (!) 96.5 F (35.8 C)  SpO2: 99%   Filed Weights   04/20/20 1144  Weight: 232 lb 12.8 oz (105.6 kg)    GENERAL:alert, no distress and comfortable SKIN: No rash EYES: sclera clear OROPHARYNX: no thrush or ulcers LUNGS: normal breathing effort HEART:  no lower extremity edema NEURO: alert & oriented x 3 with fluent speech, no focal motor deficits. Mildly decreased peripheral vibratory sense over the left fingertips per tuning fork exam PAC without erythema  LABORATORY DATA:  I have reviewed the data as listed CBC Latest Ref Rng & Units 04/20/2020 04/06/2020 03/23/2020  WBC 4.0 - 10.5 K/uL 5.9 8.6 14.8(H)  Hemoglobin 12.0 - 15.0 g/dL 10.3(L) 9.6(L) 10.5(L)  Hematocrit 36.0 - 46.0 % 32.2(L) 29.7(L) 32.6(L)  Platelets 150 - 400 K/uL 136(L) 139(L) 169     CMP Latest Ref Rng & Units 04/06/2020 03/23/2020 03/09/2020  Glucose 70 - 99 mg/dL 198(H) 129(H) 154(H)  BUN 8 - 23 mg/dL $Remove'13 10 12  'xYPKRAZ$ Creatinine 0.44 - 1.00 mg/dL 0.99 0.81 0.81  Sodium 135 - 145 mmol/L 142 144 145  Potassium 3.5 - 5.1 mmol/L 3.5 3.4(L) 3.4(L)  Chloride 98 - 111 mmol/L 107 107 108  CO2 22 - 32 mmol/L $RemoveB'26 24 26  'WSOYlPMM$ Calcium 8.9 - 10.3 mg/dL 9.5 9.3 8.7(L)  Total Protein 6.5 - 8.1 g/dL 7.1 7.3 6.8  Total Bilirubin 0.3 - 1.2 mg/dL 0.9 1.1 0.9  Alkaline Phos 38 - 126 U/L 182(H) 175(H) 163(H)  AST 15 - 41 U/L 43(H) 36 66(H)  ALT 0 - 44 U/L 33 28 47(H)      RADIOGRAPHIC STUDIES: I have personally reviewed the radiological images as listed and agreed with the findings in the report. No results found.   ASSESSMENT & PLAN: 74 yo female   1. Right colon cancer, pT3N2aM0 stage IIB, MSS -She was diagnosed in 08/2019 withcecal mass biopsy showed moderately differentiated adenocarcinoma. InitialCTAPshowed no evidence of  lymph node or distant  metastasis, or radiographic concern for bowel obstruction. -CT chest from 10/16/19 did shows multiple small lung nodules that were nonspecific but likely benign. Will monitor.  -She underwent colon surgery with Dr Marcello Moores on 11/01/19.Path showed5cm of invasive colonic adenocarcinoma that was completely resected, clear margins with 5/13 LNs. Overall Stage IIIB cancer.  -Given the lymph node involvement her recurrence risk is very high, Dr. Morey Hummingbird has recommended adjuvant chemotherapy for 6 months to reduce her recurrence risk. She previously discussed Capox versus FOLFOX and the patient consented to FOLFOX.The goal of chemo is curative. -The goal is 6 months of treatment if she can tolerate then proceed with surveillance including CT scan every 6 months for first 2-3 years;will monitor lung nodules -She has recovered very well from surgery.Herbaseline neuropathy willbemonitor closely on treatment -Beganadjuvant chemo FOLFOX cycle 1 on 12/17/2019,tolerated well. -chemo held 01/27/20 to 02/10/20 for bilateral PE, she recovered well and resumed adjuvant chemo -oxali dose reduced starting with C7 for neuropathy   2. Submassive bilateral PE, LE edema  -she developed progressive dyspnea after cycle 3 chemo, 01/27/20 CTA showed PE with right heart strain.  Doppler was negative for DVT.  She was treated with heparin and transition to Eliquis -Given that her blood clots were provoked on chemo, the plan is to stop Eliquis after 6 months of treatment.  However if she develops recurrent blood clot in the future, the recommendation would be for indefinite anticoagulation  3.  Sinus congestion, cough -completed Augmentin in 03/2020 -resolved   4.Iron deficient anemia -Her 10/03/19 Iron panel showed, Ferritin 13, Iron 34, sat ratios 8. Overall consistent with iron deficiency. -She did require blood transfusion on 11/06/19 after colon surgery. -Takes 2 tabs Slow Fe iron daily -Iron studiesnormalized,  remain WNL and stable -Can reduce to 1 tab daily  5.H/o left breast DCIS, G2, ER/PR+ -Dx in 05/2014.S/p left lumpectomy with Dr Excell Seltzer, adjuvant RT with Dr Pablo Ledger. Shecompleted adjuvantAnastrozole 08/2014-01/2020,toleratedwell. -Bone density 06/09/2015 T score +1.1 normal. Plan to repeat DEXA in 2021. -We will continue long-term surveillance with [place]  6. Comorbidities: Arthritis, DM, HTN, GERD -f/u with PCP Dr. Rex Kras  Disposition: Ms. Csaszar appears stable.  She completed 8 cycles of adjuvant FOLFOX, oxaliplatin has been dose reduced for neuropathy which is persistent, with mild sensory deficit in the left hand.  She remains functional.  I recommend to start gabapentin 100 mg qHS.  We discussed reducing oxaliplatin even further.  Patient prefers to proceed at current dose, she understands the risk that neuropathy may worsen and become chronic.   CBC reviewed. If CMP adequate will proceed with cycle 9 FOLFOX today as planned.  Follow-up and cycle 10 in 2 weeks.  If she has progressive neuropathy will likely d/c oxaliplatin.  All questions were answered. The patient knows to call the clinic with any problems, questions or concerns. No barriers to learning were detected.     Alla Feeling, NP 04/20/20

## 2020-04-20 ENCOUNTER — Inpatient Hospital Stay: Payer: Medicare Other

## 2020-04-20 ENCOUNTER — Other Ambulatory Visit: Payer: Self-pay

## 2020-04-20 ENCOUNTER — Encounter: Payer: Self-pay | Admitting: Nurse Practitioner

## 2020-04-20 ENCOUNTER — Inpatient Hospital Stay: Payer: Medicare Other | Admitting: Nurse Practitioner

## 2020-04-20 VITALS — BP 124/54 | HR 80 | Temp 96.5°F | Resp 18 | Ht 66.0 in | Wt 232.8 lb

## 2020-04-20 DIAGNOSIS — C182 Malignant neoplasm of ascending colon: Secondary | ICD-10-CM

## 2020-04-20 DIAGNOSIS — Z79899 Other long term (current) drug therapy: Secondary | ICD-10-CM | POA: Diagnosis not present

## 2020-04-20 DIAGNOSIS — Z5189 Encounter for other specified aftercare: Secondary | ICD-10-CM | POA: Diagnosis not present

## 2020-04-20 DIAGNOSIS — Z5111 Encounter for antineoplastic chemotherapy: Secondary | ICD-10-CM | POA: Diagnosis not present

## 2020-04-20 DIAGNOSIS — Z7901 Long term (current) use of anticoagulants: Secondary | ICD-10-CM | POA: Diagnosis not present

## 2020-04-20 DIAGNOSIS — E119 Type 2 diabetes mellitus without complications: Secondary | ICD-10-CM | POA: Diagnosis not present

## 2020-04-20 DIAGNOSIS — T451X5A Adverse effect of antineoplastic and immunosuppressive drugs, initial encounter: Secondary | ICD-10-CM | POA: Diagnosis not present

## 2020-04-20 DIAGNOSIS — G620 Drug-induced polyneuropathy: Secondary | ICD-10-CM | POA: Diagnosis not present

## 2020-04-20 DIAGNOSIS — D509 Iron deficiency anemia, unspecified: Secondary | ICD-10-CM | POA: Diagnosis not present

## 2020-04-20 DIAGNOSIS — Z86711 Personal history of pulmonary embolism: Secondary | ICD-10-CM | POA: Diagnosis not present

## 2020-04-20 DIAGNOSIS — Z853 Personal history of malignant neoplasm of breast: Secondary | ICD-10-CM | POA: Diagnosis not present

## 2020-04-20 DIAGNOSIS — I10 Essential (primary) hypertension: Secondary | ICD-10-CM | POA: Diagnosis not present

## 2020-04-20 DIAGNOSIS — Z95828 Presence of other vascular implants and grafts: Secondary | ICD-10-CM

## 2020-04-20 LAB — CBC WITH DIFFERENTIAL (CANCER CENTER ONLY)
Abs Immature Granulocytes: 0.27 10*3/uL — ABNORMAL HIGH (ref 0.00–0.07)
Basophils Absolute: 0.1 10*3/uL (ref 0.0–0.1)
Basophils Relative: 1 %
Eosinophils Absolute: 0.1 10*3/uL (ref 0.0–0.5)
Eosinophils Relative: 1 %
HCT: 32.2 % — ABNORMAL LOW (ref 36.0–46.0)
Hemoglobin: 10.3 g/dL — ABNORMAL LOW (ref 12.0–15.0)
Immature Granulocytes: 5 %
Lymphocytes Relative: 40 %
Lymphs Abs: 2.4 10*3/uL (ref 0.7–4.0)
MCH: 35.2 pg — ABNORMAL HIGH (ref 26.0–34.0)
MCHC: 32 g/dL (ref 30.0–36.0)
MCV: 109.9 fL — ABNORMAL HIGH (ref 80.0–100.0)
Monocytes Absolute: 0.7 10*3/uL (ref 0.1–1.0)
Monocytes Relative: 12 %
Neutro Abs: 2.4 10*3/uL (ref 1.7–7.7)
Neutrophils Relative %: 41 %
Platelet Count: 136 10*3/uL — ABNORMAL LOW (ref 150–400)
RBC: 2.93 MIL/uL — ABNORMAL LOW (ref 3.87–5.11)
RDW: 19.2 % — ABNORMAL HIGH (ref 11.5–15.5)
WBC Count: 5.9 10*3/uL (ref 4.0–10.5)
nRBC: 0.3 % — ABNORMAL HIGH (ref 0.0–0.2)

## 2020-04-20 LAB — CMP (CANCER CENTER ONLY)
ALT: 29 U/L (ref 0–44)
AST: 38 U/L (ref 15–41)
Albumin: 3.7 g/dL (ref 3.5–5.0)
Alkaline Phosphatase: 161 U/L — ABNORMAL HIGH (ref 38–126)
Anion gap: 11 (ref 5–15)
BUN: 10 mg/dL (ref 8–23)
CO2: 25 mmol/L (ref 22–32)
Calcium: 9.6 mg/dL (ref 8.9–10.3)
Chloride: 107 mmol/L (ref 98–111)
Creatinine: 0.85 mg/dL (ref 0.44–1.00)
GFR, Estimated: >60 mL/min (ref >60)
Glucose, Bld: 145 mg/dL — ABNORMAL HIGH (ref 70–99)
Potassium: 3.7 mmol/L (ref 3.5–5.1)
Sodium: 143 mmol/L (ref 135–145)
Total Bilirubin: 1.1 mg/dL (ref 0.3–1.2)
Total Protein: 7.7 g/dL (ref 6.5–8.1)

## 2020-04-20 MED ORDER — FLUOROURACIL CHEMO INJECTION 2.5 GM/50ML
400.0000 mg/m2 | Freq: Once | INTRAVENOUS | Status: AC
  Administered 2020-04-20: 900 mg via INTRAVENOUS
  Filled 2020-04-20: qty 18

## 2020-04-20 MED ORDER — PALONOSETRON HCL INJECTION 0.25 MG/5ML
INTRAVENOUS | Status: AC
  Filled 2020-04-20: qty 5

## 2020-04-20 MED ORDER — DEXTROSE 5 % IV SOLN
Freq: Once | INTRAVENOUS | Status: AC
  Filled 2020-04-20: qty 250

## 2020-04-20 MED ORDER — OXALIPLATIN CHEMO INJECTION 100 MG/20ML
50.0000 mg/m2 | Freq: Once | INTRAVENOUS | Status: AC
  Administered 2020-04-20: 115 mg via INTRAVENOUS
  Filled 2020-04-20: qty 23

## 2020-04-20 MED ORDER — SODIUM CHLORIDE 0.9% FLUSH
10.0000 mL | INTRAVENOUS | Status: DC | PRN
  Administered 2020-04-20: 10 mL
  Filled 2020-04-20: qty 10

## 2020-04-20 MED ORDER — SODIUM CHLORIDE 0.9 % IV SOLN
10.0000 mg | Freq: Once | INTRAVENOUS | Status: AC
  Administered 2020-04-20: 10 mg via INTRAVENOUS
  Filled 2020-04-20: qty 10

## 2020-04-20 MED ORDER — GABAPENTIN 100 MG PO CAPS: 100.0000 mg | ORAL_CAPSULE | Freq: Every day | ORAL | 0 refills | Status: DC

## 2020-04-20 MED ORDER — PALONOSETRON HCL INJECTION 0.25 MG/5ML
0.2500 mg | Freq: Once | INTRAVENOUS | Status: AC
  Administered 2020-04-20: 0.25 mg via INTRAVENOUS

## 2020-04-20 MED ORDER — SODIUM CHLORIDE 0.9 % IV SOLN
2400.0000 mg/m2 | INTRAVENOUS | Status: DC
  Administered 2020-04-20: 5400 mg via INTRAVENOUS
  Filled 2020-04-20: qty 108

## 2020-04-20 MED ORDER — LEUCOVORIN CALCIUM INJECTION 350 MG
400.0000 mg/m2 | Freq: Once | INTRAVENOUS | Status: AC
  Administered 2020-04-20: 904 mg via INTRAVENOUS
  Filled 2020-04-20: qty 45.2

## 2020-04-20 NOTE — Patient Instructions (Signed)
Cancer Center Discharge Instructions for Patients Receiving Chemotherapy  Today you received the following chemotherapy agents: oxaliplatin/leucovorin/fluorouracil.  To help prevent nausea and vomiting after your treatment, we encourage you to take your nausea medication as directed.  If you develop nausea and vomiting that is not controlled by your nausea medication, call the clinic.   BELOW ARE SYMPTOMS THAT SHOULD BE REPORTED IMMEDIATELY:  *FEVER GREATER THAN 100.5 F  *CHILLS WITH OR WITHOUT FEVER  NAUSEA AND VOMITING THAT IS NOT CONTROLLED WITH YOUR NAUSEA MEDICATION  *UNUSUAL SHORTNESS OF BREATH  *UNUSUAL BRUISING OR BLEEDING  TENDERNESS IN MOUTH AND THROAT WITH OR WITHOUT PRESENCE OF ULCERS  *URINARY PROBLEMS  *BOWEL PROBLEMS  UNUSUAL RASH Items with * indicate a potential emergency and should be followed up as soon as possible.  Feel free to call the clinic should you have any questions or concerns. The clinic phone number is [phone].  Please show the CHEMO ALERT CARD at check-in to the Emergency Department and triage nurse.   

## 2020-04-22 ENCOUNTER — Other Ambulatory Visit: Payer: Self-pay

## 2020-04-22 ENCOUNTER — Inpatient Hospital Stay: Payer: Medicare Other

## 2020-04-22 VITALS — BP 124/52 | HR 77 | Temp 98.0°F | Resp 18

## 2020-04-22 DIAGNOSIS — Z7901 Long term (current) use of anticoagulants: Secondary | ICD-10-CM | POA: Diagnosis not present

## 2020-04-22 DIAGNOSIS — Z853 Personal history of malignant neoplasm of breast: Secondary | ICD-10-CM | POA: Diagnosis not present

## 2020-04-22 DIAGNOSIS — T451X5A Adverse effect of antineoplastic and immunosuppressive drugs, initial encounter: Secondary | ICD-10-CM | POA: Diagnosis not present

## 2020-04-22 DIAGNOSIS — D509 Iron deficiency anemia, unspecified: Secondary | ICD-10-CM | POA: Diagnosis not present

## 2020-04-22 DIAGNOSIS — Z5189 Encounter for other specified aftercare: Secondary | ICD-10-CM | POA: Diagnosis not present

## 2020-04-22 DIAGNOSIS — Z79899 Other long term (current) drug therapy: Secondary | ICD-10-CM | POA: Diagnosis not present

## 2020-04-22 DIAGNOSIS — I10 Essential (primary) hypertension: Secondary | ICD-10-CM | POA: Diagnosis not present

## 2020-04-22 DIAGNOSIS — C182 Malignant neoplasm of ascending colon: Secondary | ICD-10-CM

## 2020-04-22 DIAGNOSIS — G620 Drug-induced polyneuropathy: Secondary | ICD-10-CM | POA: Diagnosis not present

## 2020-04-22 DIAGNOSIS — E119 Type 2 diabetes mellitus without complications: Secondary | ICD-10-CM | POA: Diagnosis not present

## 2020-04-22 DIAGNOSIS — Z86711 Personal history of pulmonary embolism: Secondary | ICD-10-CM | POA: Diagnosis not present

## 2020-04-22 DIAGNOSIS — Z5111 Encounter for antineoplastic chemotherapy: Secondary | ICD-10-CM | POA: Diagnosis not present

## 2020-04-22 MED ORDER — HEPARIN SOD (PORK) LOCK FLUSH 100 UNIT/ML IV SOLN
500.0000 [IU] | Freq: Once | INTRAVENOUS | Status: AC | PRN
  Administered 2020-04-22: 500 [IU]
  Filled 2020-04-22: qty 5

## 2020-04-22 MED ORDER — PEGFILGRASTIM-CBQV 6 MG/0.6ML ~~LOC~~ SOSY
6.0000 mg | PREFILLED_SYRINGE | Freq: Once | SUBCUTANEOUS | Status: AC
Start: 2020-04-22 — End: 2020-04-22
  Administered 2020-04-22: 6 mg via SUBCUTANEOUS

## 2020-04-22 MED ORDER — SODIUM CHLORIDE 0.9% FLUSH
10.0000 mL | INTRAVENOUS | Status: DC | PRN
  Administered 2020-04-22: 10 mL
  Filled 2020-04-22: qty 10

## 2020-04-22 MED ORDER — PEGFILGRASTIM-CBQV 6 MG/0.6ML ~~LOC~~ SOSY
PREFILLED_SYRINGE | SUBCUTANEOUS | Status: AC
  Filled 2020-04-22: qty 0.6

## 2020-04-22 NOTE — Patient Instructions (Signed)

## 2020-04-24 ENCOUNTER — Other Ambulatory Visit: Payer: Self-pay | Admitting: Hematology

## 2020-05-04 NOTE — Progress Notes (Unsigned)
[place]   [phone] Fax:([phone]) [phone]   Clinic Follow up Note   Patient Care Team: Hulan Fess, MD as PCP - General (Family Medicine) Excell Seltzer, MD (Inactive) as Consulting Physician (General Surgery) Rolm Bookbinder, MD as Consulting Physician (General Surgery) Thea Silversmith, MD as Consulting Physician (Radiation Oncology) Holley Bouche, NP (Inactive) as Nurse Practitioner (Nurse Practitioner) Truitt Merle, MD as Consulting Physician (Hematology) Jonnie Finner, RN as Oncology Nurse Navigator Leighton Ruff, MD as Consulting Physician (General Surgery) Nicholas Lose, MD as Consulting Physician (Hematology and Oncology) Leighton Ruff, MD as Consulting Physician (General Surgery) 05/04/2020  CHIEF COMPLAINT: Follow up history of colon cancer   SUMMARY OF ONCOLOGIC HISTORY: Oncology History Overview Note  Cancer Staging Breast cancer of upper-outer quadrant of left female breast [facility]) Staging form: Breast, AJCC 7th Edition - Clinical stage from 06/11/2014: Stage 0 (Tis (DCIS), N0, M0) - Unsigned - Pathologic stage from 07/03/2014: Stage Unknown (Tis (DCIS), NX, cM0) - Signed by Enid Cutter, MD on 07/10/2014 Staging comments: Staged on final lumpectomy specimen by Dr. Donato Heinz.  right colon cancer Staging form: Colon and Rectum, AJCC 8th Edition - Pathologic stage from 11/01/2019: Stage IIIB (pT3, pN2a, cM0) - Signed by Truitt Merle, MD on 11/29/2019    Malignant neoplasm of female breast ([place])  05/29/2014 Initial Biopsy   Left breast needle core biopsy: Grade 2, DCIS with calcs. ER+ (100%), PR+ (96%).    06/04/2014 Initial Diagnosis   Left breast DCIS with calcifications, ER 100%, PR 96%   06/10/2014 Breast MRI   Left breast: 2.4 x 1.3 x 1.1 cm area of patchy non-mass enhancement upper outer quadrant includes postbiopsy seroma; Right breast: 1.2 cm previously biopsied stable benign fibroadenoma   06/12/2014 Procedure   Genetic  counseling/testing: Identified 1 VUS on CHEK2 gene. Remainder of 17 gene panel tested negative and included: ATM, BARD1, BRCA 1/2, BRIP1, CDH1, CHEK2, EPCAM, MLH1, MSH2, MSH6, NBN, NF1, PALB2, PTEN, RAD50, RAD51C, RAD51D, STK11, and TP53.    07/01/2014 Surgery   Left breast lumpectomy ([place]): Grade 1, DCIS, spanning 2.3 cm, 1 mm margin, ER 100%, PR 96%   07/31/2014 - 08/28/2014 Radiation Therapy   Adjuvant RT completed [facility]). Left breast: Total dose 42.5 Gy over 17 fractions. Left breast boost: Total dose 7.5 Gy over 3 fractions.    09/14/2014 - 01/13/2020 Anti-estrogen oral therapy   Anastrazole $RemoveBefo'1mg'GPLoxXKynqH$  daily. Planned duration of treatment: 5 years [place]). Completed in 01/2020.    09/25/2014 Survivorship   Survivorship Care Plan given to patient and reviewed with her in person.    right colon cancer  09/19/2019 Imaging   CT AP W contrast 09/19/19  IMPRESSION Fullness in the cecum, cannot exclude a mass. No evidence for metastatic disease is identified.    09/24/2019 Procedure   Colonoscopy by Dr Eber Jones 09/24/19 IMPRESSION 1. The colon was redundant  2. Mild diverticulosis was noted through the entire examined colon 3. Single 17mm polyp was found in the ascending colon; polypectomy was performed using snare cautery and biopsy forceps 4. Mild diverticulosis was notes in the descending colon and sigmoid colon.  5. Single polyp was found in the sigmoid colon, polypectomy was performed with cold forceps.  6. Single polyp was found in the rectosigmoid colon; polypectomy was performed with cold snare  7. Small internal hemorrhoids  8. Large mass was found at the cecum; multiple biopsies of the area were performed using cold forceps; injection (tattooing) was performed distal to the mass.  09/24/2019 Initial Biopsy   INTERPRETATION AND DIAGNOSIS:  A. Cecum, biopsy:  Invasive moderately differentiated adenocarcinoma.  see comment  B. Polyp @ ascending colon, polypectomy:  Tubular Adenoma   C. Polyp @ sigmoid colon Polypectomy:  hyperplastic polyp.  D. Polyp @ rectosigmoid colon, Polypectomy:  Hyperplastic Polyp      10/16/2019 Imaging   CT Chest IMPRESSION: 1. Multiple small pulmonary nodules measuring 5 mm or less in size in the lungs. These are nonspecific and are typically considered statistically likely benign. However, given the patient's history of primary malignancy, close attention on follow-up studies is recommended to ensure stability. 2. Aortic atherosclerosis, in addition to right coronary artery disease. Assessment for potential risk factor modification, dietary therapy or pharmacologic therapy may be warranted, if clinically indicated. 3. There are calcifications of the aortic valve and mitral annulus. Echocardiographic correlation for evaluation of potential valvular dysfunction may be warranted if clinically indicated. 4. Small hiatal hernia.   Aortic Atherosclerosis (ICD10-I70.0).   11/01/2019 Initial Diagnosis   Colon cancer ([place])   11/01/2019 Surgery   LAPAROSCOPIC PARTIAL COLECTOMY by Dr Marcello Moores and Dr Johney Maine   11/01/2019 Pathology Results   FINAL MICROSCOPIC DIAGNOSIS:   A. COLON, PROXIMAL RIGHT, COLECTOMY:  - Invasive colonic adenocarcinoma, 5 cm.  - Tumor invades through the muscularis propria into pericolonic tissues.   - Margins of resection are not involved.  - Metastatic carcinoma in (5) of (13) lymph nodes.  - See oncology table.    MSI Stable  Mismatch repair normal  MLH1 - Preserved nuclear expression (greater 50% tumor expression) MSH2 - Preserved nuclear expression (greater 50% tumor expression) MSH6 - Preserved nuclear expression (greater 50% tumor expression) PMS2 - Preserved nuclear expression (greater 50% tumor expression)   11/01/2019 Cancer Staging   Staging form: Colon and Rectum, AJCC 8th Edition - Pathologic stage from 11/01/2019: Stage IIIB (pT3, pN2a, cM0) - Signed by Truitt Merle, MD on 11/29/2019   12/10/2019 Procedure    PAC placed 12/10/19   12/17/2019 -  Chemotherapy   FOLFOX q2weeks starting in 2 weeks starting 12/17/19     CURRENT THERAPY: Adjuvant FOLFOX q2 weeks starting 12/17/2019   INTERVAL HISTORY: Ms. Biggins returns for follow up and treatment as scheduled. She completed cycle 9 FOLFOX on 04/20/20.    REVIEW OF SYSTEMS:   Constitutional: Denies fevers, chills or abnormal weight loss Eyes: Denies blurriness of vision Ears, nose, mouth, throat, and face: Denies mucositis or sore throat Respiratory: Denies cough, dyspnea or wheezes Cardiovascular: Denies palpitation, chest discomfort or lower extremity swelling Gastrointestinal:  Denies nausea, heartburn or change in bowel habits Skin: Denies abnormal skin rashes Lymphatics: Denies new lymphadenopathy or easy bruising Neurological:Denies numbness, tingling or new weaknesses Behavioral/Psych: Mood is stable, no new changes  All other systems were reviewed with the patient and are negative.  MEDICAL HISTORY:  Past Medical History:  Diagnosis Date  . Aortic atherosclerosis ([place])   . Arthritis    feet, lower back  . Basal cell carcinoma    arm  . Breast cancer of upper-outer quadrant of left female breast ([place]) 06/04/2014  . Cataract    immature on the left  . Colon cancer ([place]) 08/2019  . Diabetes mellitus without complication ([place])   . Diverticulosis   . Dizziness    > 65yrs ago;took Antivert   . Family history of anesthesia complication    sister slow to wake up with anesthesia  . Family history of breast cancer   . Family history of colon  cancer   . Family history of uterine cancer   . GERD (gastroesophageal reflux disease)    takes occasional TUMs  . History of bronchitis    > 79yrs ago  . History of colon polyps   . History of hiatal hernia    Small noted on CT  . Hypertension    takes Losartan daily and HCTZ  . Iron deficiency anemia   . Joint pain   . Numbness    to toes on each foot  . Peripheral neuropathy    feet  and toes  . Personal history of radiation therapy   . Pulmonary nodules    Noted on CT  . Radiation 07/31/14-08/28/14   Left Breast 20 fxs  . Seasonal allergies    takes Claritin prn  . Urinary frequency   . Vitamin D deficiency    takes VIt D daily    SURGICAL HISTORY: Past Surgical History:  Procedure Laterality Date  . BREAST BIOPSY Bilateral   . BREAST LUMPECTOMY Left   . BREAST LUMPECTOMY WITH RADIOACTIVE SEED LOCALIZATION Left 07/01/2014   Procedure: LEFT BREAST LUMPECTOMY WITH RADIOACTIVE SEED LOCALIZATION;  Surgeon: Excell Seltzer, MD;  Location: [facility];  Service: General;  Laterality: Left;  . CATARACT EXTRACTION Right   . COLONOSCOPY    . LAPAROSCOPIC PARTIAL COLECTOMY N/A 11/01/2019   Procedure: LAPAROSCOPIC PARTIAL COLECTOMY;  Surgeon: Leighton Ruff, MD;  Location: [facility];  Service: General;  Laterality: N/A;  . PORTACATH PLACEMENT N/A 12/10/2019   Procedure: INSERTION PORT-A-CATH ULTRASOUND GUIDED IN RIGHT IJ;  Surgeon: Leighton Ruff, MD;  Location: [facility];  Service: General;  Laterality: N/A;  . TOTAL KNEE ARTHROPLASTY Left 10/24/2012   Procedure: TOTAL KNEE ARTHROPLASTY;  Surgeon: Kerin Salen, MD;  Location: [place];  Service: Orthopedics;  Laterality: Left;  . TOTAL KNEE ARTHROPLASTY Right 01/09/2013   Procedure: TOTAL KNEE ARTHROPLASTY;  Surgeon: Kerin Salen, MD;  Location: [place];  Service: Orthopedics;  Laterality: Right;  . TUBAL LIGATION      I have reviewed the social history and family history with the patient and they are unchanged from previous note.  ALLERGIES:  is allergic to oxycodone.  MEDICATIONS:  Current Outpatient Medications  Medication Sig Dispense Refill  . amoxicillin-clavulanate (AUGMENTIN) 875-125 MG tablet Take 1 tablet by mouth 2 (two) times daily. 14 tablet 0  . apixaban (ELIQUIS) 5 MG TABS tablet Take 2 tablets (10 mg total) by mouth 2 (two) times daily. Take 2 tablets ($RemoveBe'10mg'kRoCzBWlm$ ) 2 times daily x 7 days, then 1 tablet  ($Remove'5mg'ZacdDIy$ ) 2 times daily 70 tablet 2  . Cholecalciferol (VITAMIN D) 2000 UNITS CAPS Take 2,000 Units by mouth daily.     . Ferrous Sulfate (SLOW FE PO) Take 1 tablet by mouth in the morning and at bedtime.     . furosemide (LASIX) 20 MG tablet Take 1 tablet by mouth once daily 30 tablet 0  . gabapentin (NEURONTIN) 100 MG capsule Take 1 capsule (100 mg total) by mouth at bedtime. 30 capsule 0  . ibuprofen (ADVIL) 200 MG tablet Take 400 mg by mouth every 6 (six) hours as needed for moderate pain.     Marland Kitchen lidocaine-prilocaine (EMLA) cream Apply to affected area once (Patient taking differently: Apply 1 application topically daily as needed (port access). ) 30 g 3  . loratadine (CLARITIN) 10 MG tablet Take 10 mg by mouth daily as needed for allergies.    Marland Kitchen losartan (COZAAR) 25 MG tablet Take 25  mg by mouth daily.    . metFORMIN (GLUCOPHAGE) 500 MG tablet Take 1 tablet (500 mg total) by mouth daily with breakfast. (Patient taking differently: Take 500 mg by mouth in the morning and at bedtime. )    . Multiple Vitamins-Minerals (MULTIVITAMIN WITH MINERALS) tablet Take 1 tablet by mouth daily.    Marland Kitchen omeprazole (PRILOSEC) 20 MG capsule Take 20 mg by mouth daily before breakfast.     . ondansetron (ZOFRAN) 8 MG tablet Take 1 tablet (8 mg total) by mouth 2 (two) times daily as needed for refractory nausea / vomiting. Start on day 3 after chemotherapy. 30 tablet 1  . potassium chloride (KLOR-CON) 10 MEQ tablet Take 1 tablet (10 mEq total) by mouth 2 (two) times daily. 60 tablet 1  . pravastatin (PRAVACHOL) 10 MG tablet Take 10 mg by mouth daily.    . prochlorperazine (COMPAZINE) 10 MG tablet Take 1 tablet (10 mg total) by mouth every 6 (six) hours as needed (Nausea or vomiting). 30 tablet 1   No current facility-administered medications for this visit.    PHYSICAL EXAMINATION: ECOG PERFORMANCE STATUS: {CHL ONC ECOG PS:[id number]}  There were no vitals filed for this visit. There were no vitals filed for this  visit.  GENERAL:alert, no distress and comfortable SKIN: skin color, texture, turgor are normal, no rashes or significant lesions EYES: normal, Conjunctiva are pink and non-injected, sclera clear OROPHARYNX:no exudate, no erythema and lips, buccal mucosa, and tongue normal  NECK: supple, thyroid normal size, non-tender, without nodularity LYMPH:  no palpable lymphadenopathy in the cervical, axillary or inguinal LUNGS: clear to auscultation and percussion with normal breathing effort HEART: regular rate & rhythm and no murmurs and no lower extremity edema ABDOMEN:abdomen soft, non-tender and normal bowel sounds Musculoskeletal:no cyanosis of digits and no clubbing  NEURO: alert & oriented x 3 with fluent speech, no focal motor/sensory deficits  LABORATORY DATA:  I have reviewed the data as listed CBC Latest Ref Rng & Units 04/20/2020 04/06/2020 03/23/2020  WBC 4.0 - 10.5 K/uL 5.9 8.6 14.8(H)  Hemoglobin 12.0 - 15.0 g/dL 10.3(L) 9.6(L) 10.5(L)  Hematocrit 36.0 - 46.0 % 32.2(L) 29.7(L) 32.6(L)  Platelets 150 - 400 K/uL 136(L) 139(L) 169     CMP Latest Ref Rng & Units 04/20/2020 04/06/2020 03/23/2020  Glucose 70 - 99 mg/dL 145(H) 198(H) 129(H)  BUN 8 - 23 mg/dL $Remove'10 13 10  'xvNWrUv$ Creatinine 0.44 - 1.00 mg/dL 0.85 0.99 0.81  Sodium 135 - 145 mmol/L 143 142 144  Potassium 3.5 - 5.1 mmol/L 3.7 3.5 3.4(L)  Chloride 98 - 111 mmol/L 107 107 107  CO2 22 - 32 mmol/L $RemoveB'25 26 24  'wXjffUGs$ Calcium 8.9 - 10.3 mg/dL 9.6 9.5 9.3  Total Protein 6.5 - 8.1 g/dL 7.7 7.1 7.3  Total Bilirubin 0.3 - 1.2 mg/dL 1.1 0.9 1.1  Alkaline Phos 38 - 126 U/L 161(H) 182(H) 175(H)  AST 15 - 41 U/L 38 43(H) 36  ALT 0 - 44 U/L 29 33 28      RADIOGRAPHIC STUDIES: I have personally reviewed the radiological images as listed and agreed with the findings in the report. No results found.   ASSESSMENT & PLAN:  No problem-specific Assessment & Plan notes found for this encounter.   No orders of the defined types were placed in this  encounter.  All questions were answered. The patient knows to call the clinic with any problems, questions or concerns. No barriers to learning was detected. I spent {CHL ONC TIME VISIT -  VQMGQ:[id number]} counseling the patient face to face. The total time spent in the appointment was {CHL ONC TIME VISIT - IZTIW:[id number]} and more than 50% was on counseling and review of test results     Alla Feeling, NP 05/04/20

## 2020-05-05 ENCOUNTER — Inpatient Hospital Stay: Payer: Medicare Other

## 2020-05-05 ENCOUNTER — Inpatient Hospital Stay: Payer: Medicare Other | Attending: Hematology

## 2020-05-05 ENCOUNTER — Other Ambulatory Visit: Payer: Self-pay

## 2020-05-05 ENCOUNTER — Inpatient Hospital Stay (HOSPITAL_BASED_OUTPATIENT_CLINIC_OR_DEPARTMENT_OTHER): Payer: Medicare Other | Admitting: Hematology

## 2020-05-05 VITALS — BP 117/54 | HR 68 | Temp 98.2°F | Resp 18 | Wt 225.0 lb

## 2020-05-05 DIAGNOSIS — K219 Gastro-esophageal reflux disease without esophagitis: Secondary | ICD-10-CM | POA: Diagnosis not present

## 2020-05-05 DIAGNOSIS — M1990 Unspecified osteoarthritis, unspecified site: Secondary | ICD-10-CM | POA: Diagnosis not present

## 2020-05-05 DIAGNOSIS — Z86711 Personal history of pulmonary embolism: Secondary | ICD-10-CM | POA: Insufficient documentation

## 2020-05-05 DIAGNOSIS — D509 Iron deficiency anemia, unspecified: Secondary | ICD-10-CM | POA: Diagnosis not present

## 2020-05-05 DIAGNOSIS — Z79899 Other long term (current) drug therapy: Secondary | ICD-10-CM | POA: Diagnosis not present

## 2020-05-05 DIAGNOSIS — Z7984 Long term (current) use of oral hypoglycemic drugs: Secondary | ICD-10-CM | POA: Insufficient documentation

## 2020-05-05 DIAGNOSIS — Z5189 Encounter for other specified aftercare: Secondary | ICD-10-CM | POA: Insufficient documentation

## 2020-05-05 DIAGNOSIS — Z86000 Personal history of in-situ neoplasm of breast: Secondary | ICD-10-CM | POA: Diagnosis not present

## 2020-05-05 DIAGNOSIS — Z85828 Personal history of other malignant neoplasm of skin: Secondary | ICD-10-CM | POA: Diagnosis not present

## 2020-05-05 DIAGNOSIS — E1136 Type 2 diabetes mellitus with diabetic cataract: Secondary | ICD-10-CM | POA: Insufficient documentation

## 2020-05-05 DIAGNOSIS — E1140 Type 2 diabetes mellitus with diabetic neuropathy, unspecified: Secondary | ICD-10-CM | POA: Insufficient documentation

## 2020-05-05 DIAGNOSIS — I700 Atherosclerosis of aorta: Secondary | ICD-10-CM | POA: Insufficient documentation

## 2020-05-05 DIAGNOSIS — I2510 Atherosclerotic heart disease of native coronary artery without angina pectoris: Secondary | ICD-10-CM | POA: Diagnosis not present

## 2020-05-05 DIAGNOSIS — C182 Malignant neoplasm of ascending colon: Secondary | ICD-10-CM

## 2020-05-05 DIAGNOSIS — Z7901 Long term (current) use of anticoagulants: Secondary | ICD-10-CM | POA: Diagnosis not present

## 2020-05-05 DIAGNOSIS — Z5111 Encounter for antineoplastic chemotherapy: Secondary | ICD-10-CM | POA: Diagnosis not present

## 2020-05-05 DIAGNOSIS — Z9223 Personal history of estrogen therapy: Secondary | ICD-10-CM | POA: Diagnosis not present

## 2020-05-05 DIAGNOSIS — Z791 Long term (current) use of non-steroidal anti-inflammatories (NSAID): Secondary | ICD-10-CM | POA: Insufficient documentation

## 2020-05-05 DIAGNOSIS — I10 Essential (primary) hypertension: Secondary | ICD-10-CM | POA: Insufficient documentation

## 2020-05-05 DIAGNOSIS — R918 Other nonspecific abnormal finding of lung field: Secondary | ICD-10-CM | POA: Insufficient documentation

## 2020-05-05 DIAGNOSIS — Z95828 Presence of other vascular implants and grafts: Secondary | ICD-10-CM

## 2020-05-05 DIAGNOSIS — Z923 Personal history of irradiation: Secondary | ICD-10-CM | POA: Insufficient documentation

## 2020-05-05 DIAGNOSIS — Z170 Estrogen receptor positive status [ER+]: Secondary | ICD-10-CM | POA: Insufficient documentation

## 2020-05-05 LAB — CMP (CANCER CENTER ONLY)
ALT: 41 U/L (ref 0–44)
AST: 66 U/L — ABNORMAL HIGH (ref 15–41)
Albumin: 3.7 g/dL (ref 3.5–5.0)
Alkaline Phosphatase: 158 U/L — ABNORMAL HIGH (ref 38–126)
Anion gap: 9 (ref 5–15)
BUN: 11 mg/dL (ref 8–23)
CO2: 25 mmol/L (ref 22–32)
Calcium: 9.9 mg/dL (ref 8.9–10.3)
Chloride: 105 mmol/L (ref 98–111)
Creatinine: 0.87 mg/dL (ref 0.44–1.00)
GFR, Estimated: >60 mL/min (ref >60)
Glucose, Bld: 114 mg/dL — ABNORMAL HIGH (ref 70–99)
Potassium: 4.7 mmol/L (ref 3.5–5.1)
Sodium: 139 mmol/L (ref 135–145)
Total Bilirubin: 1.3 mg/dL — ABNORMAL HIGH (ref 0.3–1.2)
Total Protein: 7.5 g/dL (ref 6.5–8.1)

## 2020-05-05 LAB — CBC WITH DIFFERENTIAL (CANCER CENTER ONLY)
Abs Immature Granulocytes: 1.03 10*3/uL — ABNORMAL HIGH (ref 0.00–0.07)
Basophils Absolute: 0.1 10*3/uL (ref 0.0–0.1)
Basophils Relative: 1 %
Eosinophils Absolute: 0.1 10*3/uL (ref 0.0–0.5)
Eosinophils Relative: 1 %
HCT: 33.4 % — ABNORMAL LOW (ref 36.0–46.0)
Hemoglobin: 10.5 g/dL — ABNORMAL LOW (ref 12.0–15.0)
Immature Granulocytes: 9 %
Lymphocytes Relative: 24 %
Lymphs Abs: 3 10*3/uL (ref 0.7–4.0)
MCH: 35.5 pg — ABNORMAL HIGH (ref 26.0–34.0)
MCHC: 31.4 g/dL (ref 30.0–36.0)
MCV: 112.8 fL — ABNORMAL HIGH (ref 80.0–100.0)
Monocytes Absolute: 0.9 10*3/uL (ref 0.1–1.0)
Monocytes Relative: 7 %
Neutro Abs: 7.1 10*3/uL (ref 1.7–7.7)
Neutrophils Relative %: 58 %
Platelet Count: 174 10*3/uL (ref 150–400)
RBC: 2.96 MIL/uL — ABNORMAL LOW (ref 3.87–5.11)
RDW: 18.7 % — ABNORMAL HIGH (ref 11.5–15.5)
WBC Count: 12.2 10*3/uL — ABNORMAL HIGH (ref 4.0–10.5)
nRBC: 0.2 % (ref 0.0–0.2)

## 2020-05-05 MED ORDER — LEUCOVORIN CALCIUM INJECTION 350 MG
400.0000 mg/m2 | Freq: Once | INTRAVENOUS | Status: AC
  Administered 2020-05-05: 904 mg via INTRAVENOUS
  Filled 2020-05-05: qty 45.2

## 2020-05-05 MED ORDER — SODIUM CHLORIDE 0.9 % IV SOLN
2400.0000 mg/m2 | INTRAVENOUS | Status: DC
  Administered 2020-05-05: 5400 mg via INTRAVENOUS
  Filled 2020-05-05: qty 108

## 2020-05-05 MED ORDER — FLUOROURACIL CHEMO INJECTION 2.5 GM/50ML
400.0000 mg/m2 | Freq: Once | INTRAVENOUS | Status: AC
  Administered 2020-05-05: 900 mg via INTRAVENOUS
  Filled 2020-05-05: qty 18

## 2020-05-05 MED ORDER — PALONOSETRON HCL INJECTION 0.25 MG/5ML
INTRAVENOUS | Status: AC
  Filled 2020-05-05: qty 5

## 2020-05-05 MED ORDER — SODIUM CHLORIDE 0.9% FLUSH
10.0000 mL | INTRAVENOUS | Status: DC | PRN
  Filled 2020-05-05: qty 10

## 2020-05-05 MED ORDER — DEXTROSE 5 % IV SOLN
Freq: Once | INTRAVENOUS | Status: AC
  Filled 2020-05-05: qty 250

## 2020-05-05 MED ORDER — HEPARIN SOD (PORK) LOCK FLUSH 100 UNIT/ML IV SOLN
500.0000 [IU] | Freq: Once | INTRAVENOUS | Status: DC | PRN
  Filled 2020-05-05: qty 5

## 2020-05-05 MED ORDER — SODIUM CHLORIDE 0.9% FLUSH
10.0000 mL | INTRAVENOUS | Status: DC | PRN
  Administered 2020-05-05: 10 mL
  Filled 2020-05-05: qty 10

## 2020-05-05 MED ORDER — PALONOSETRON HCL INJECTION 0.25 MG/5ML
0.2500 mg | Freq: Once | INTRAVENOUS | Status: AC
  Administered 2020-05-05: 0.25 mg via INTRAVENOUS

## 2020-05-05 MED ORDER — SODIUM CHLORIDE 0.9 % IV SOLN
10.0000 mg | Freq: Once | INTRAVENOUS | Status: AC
  Administered 2020-05-05: 10 mg via INTRAVENOUS
  Filled 2020-05-05: qty 10

## 2020-05-05 MED ORDER — OXALIPLATIN CHEMO INJECTION 100 MG/20ML
50.0000 mg/m2 | Freq: Once | INTRAVENOUS | Status: AC
  Administered 2020-05-05: 115 mg via INTRAVENOUS
  Filled 2020-05-05: qty 20

## 2020-05-07 ENCOUNTER — Other Ambulatory Visit: Payer: Self-pay

## 2020-05-07 ENCOUNTER — Inpatient Hospital Stay: Payer: Medicare Other

## 2020-05-07 VITALS — BP 131/58 | HR 56 | Resp 18

## 2020-05-07 DIAGNOSIS — I2510 Atherosclerotic heart disease of native coronary artery without angina pectoris: Secondary | ICD-10-CM | POA: Diagnosis not present

## 2020-05-07 DIAGNOSIS — M1990 Unspecified osteoarthritis, unspecified site: Secondary | ICD-10-CM | POA: Diagnosis not present

## 2020-05-07 DIAGNOSIS — C182 Malignant neoplasm of ascending colon: Secondary | ICD-10-CM

## 2020-05-07 DIAGNOSIS — D509 Iron deficiency anemia, unspecified: Secondary | ICD-10-CM | POA: Diagnosis not present

## 2020-05-07 DIAGNOSIS — E1140 Type 2 diabetes mellitus with diabetic neuropathy, unspecified: Secondary | ICD-10-CM | POA: Diagnosis not present

## 2020-05-07 DIAGNOSIS — Z5111 Encounter for antineoplastic chemotherapy: Secondary | ICD-10-CM | POA: Diagnosis not present

## 2020-05-07 DIAGNOSIS — Z170 Estrogen receptor positive status [ER+]: Secondary | ICD-10-CM | POA: Diagnosis not present

## 2020-05-07 DIAGNOSIS — I10 Essential (primary) hypertension: Secondary | ICD-10-CM | POA: Diagnosis not present

## 2020-05-07 DIAGNOSIS — Z9223 Personal history of estrogen therapy: Secondary | ICD-10-CM | POA: Diagnosis not present

## 2020-05-07 DIAGNOSIS — K219 Gastro-esophageal reflux disease without esophagitis: Secondary | ICD-10-CM | POA: Diagnosis not present

## 2020-05-07 DIAGNOSIS — Z923 Personal history of irradiation: Secondary | ICD-10-CM | POA: Diagnosis not present

## 2020-05-07 DIAGNOSIS — E1136 Type 2 diabetes mellitus with diabetic cataract: Secondary | ICD-10-CM | POA: Diagnosis not present

## 2020-05-07 DIAGNOSIS — Z5189 Encounter for other specified aftercare: Secondary | ICD-10-CM | POA: Diagnosis not present

## 2020-05-07 DIAGNOSIS — I700 Atherosclerosis of aorta: Secondary | ICD-10-CM | POA: Diagnosis not present

## 2020-05-07 DIAGNOSIS — R918 Other nonspecific abnormal finding of lung field: Secondary | ICD-10-CM | POA: Diagnosis not present

## 2020-05-07 DIAGNOSIS — Z86000 Personal history of in-situ neoplasm of breast: Secondary | ICD-10-CM | POA: Diagnosis not present

## 2020-05-07 MED ORDER — SODIUM CHLORIDE 0.9% FLUSH
10.0000 mL | INTRAVENOUS | Status: DC | PRN
  Administered 2020-05-07: 10 mL
  Filled 2020-05-07: qty 10

## 2020-05-07 MED ORDER — PEGFILGRASTIM-CBQV 6 MG/0.6ML ~~LOC~~ SOSY
6.0000 mg | PREFILLED_SYRINGE | Freq: Once | SUBCUTANEOUS | Status: AC
  Administered 2020-05-07: 6 mg via SUBCUTANEOUS

## 2020-05-07 MED ORDER — HEPARIN SOD (PORK) LOCK FLUSH 100 UNIT/ML IV SOLN
500.0000 [IU] | Freq: Once | INTRAVENOUS | Status: AC | PRN
  Administered 2020-05-07: 500 [IU]
  Filled 2020-05-07: qty 5

## 2020-05-07 MED ORDER — PEGFILGRASTIM-CBQV 6 MG/0.6ML ~~LOC~~ SOSY
PREFILLED_SYRINGE | SUBCUTANEOUS | Status: AC
  Filled 2020-05-07: qty 0.6

## 2020-05-07 NOTE — Patient Instructions (Signed)

## 2020-05-11 ENCOUNTER — Other Ambulatory Visit: Payer: Self-pay | Admitting: Nurse Practitioner

## 2020-05-15 MED FILL — Dexamethasone Sodium Phosphate Inj 100 MG/10ML: INTRAMUSCULAR | Qty: 1 | Status: AC

## 2020-05-18 ENCOUNTER — Inpatient Hospital Stay: Payer: Medicare Other | Admitting: Nurse Practitioner

## 2020-05-18 ENCOUNTER — Inpatient Hospital Stay: Payer: Medicare Other

## 2020-05-18 ENCOUNTER — Encounter: Payer: Self-pay | Admitting: Nurse Practitioner

## 2020-05-18 ENCOUNTER — Other Ambulatory Visit: Payer: Self-pay

## 2020-05-18 VITALS — BP 132/61 | HR 74 | Temp 96.8°F | Resp 18 | Ht 66.0 in | Wt 231.3 lb

## 2020-05-18 DIAGNOSIS — Z95828 Presence of other vascular implants and grafts: Secondary | ICD-10-CM

## 2020-05-18 DIAGNOSIS — C182 Malignant neoplasm of ascending colon: Secondary | ICD-10-CM

## 2020-05-18 DIAGNOSIS — Z5189 Encounter for other specified aftercare: Secondary | ICD-10-CM | POA: Diagnosis not present

## 2020-05-18 DIAGNOSIS — Z170 Estrogen receptor positive status [ER+]: Secondary | ICD-10-CM | POA: Diagnosis not present

## 2020-05-18 DIAGNOSIS — M1990 Unspecified osteoarthritis, unspecified site: Secondary | ICD-10-CM | POA: Diagnosis not present

## 2020-05-18 DIAGNOSIS — I10 Essential (primary) hypertension: Secondary | ICD-10-CM | POA: Diagnosis not present

## 2020-05-18 DIAGNOSIS — E1136 Type 2 diabetes mellitus with diabetic cataract: Secondary | ICD-10-CM | POA: Diagnosis not present

## 2020-05-18 DIAGNOSIS — I700 Atherosclerosis of aorta: Secondary | ICD-10-CM | POA: Diagnosis not present

## 2020-05-18 DIAGNOSIS — E1140 Type 2 diabetes mellitus with diabetic neuropathy, unspecified: Secondary | ICD-10-CM | POA: Diagnosis not present

## 2020-05-18 DIAGNOSIS — Z9223 Personal history of estrogen therapy: Secondary | ICD-10-CM | POA: Diagnosis not present

## 2020-05-18 DIAGNOSIS — K219 Gastro-esophageal reflux disease without esophagitis: Secondary | ICD-10-CM | POA: Diagnosis not present

## 2020-05-18 DIAGNOSIS — I2510 Atherosclerotic heart disease of native coronary artery without angina pectoris: Secondary | ICD-10-CM | POA: Diagnosis not present

## 2020-05-18 DIAGNOSIS — D509 Iron deficiency anemia, unspecified: Secondary | ICD-10-CM | POA: Diagnosis not present

## 2020-05-18 DIAGNOSIS — Z923 Personal history of irradiation: Secondary | ICD-10-CM | POA: Diagnosis not present

## 2020-05-18 DIAGNOSIS — R918 Other nonspecific abnormal finding of lung field: Secondary | ICD-10-CM | POA: Diagnosis not present

## 2020-05-18 DIAGNOSIS — Z5111 Encounter for antineoplastic chemotherapy: Secondary | ICD-10-CM | POA: Diagnosis not present

## 2020-05-18 DIAGNOSIS — Z86000 Personal history of in-situ neoplasm of breast: Secondary | ICD-10-CM | POA: Diagnosis not present

## 2020-05-18 LAB — CBC WITH DIFFERENTIAL (CANCER CENTER ONLY)
Abs Immature Granulocytes: 0.39 10*3/uL — ABNORMAL HIGH (ref 0.00–0.07)
Basophils Absolute: 0.1 10*3/uL (ref 0.0–0.1)
Basophils Relative: 1 %
Eosinophils Absolute: 0.1 10*3/uL (ref 0.0–0.5)
Eosinophils Relative: 1 %
HCT: 34 % — ABNORMAL LOW (ref 36.0–46.0)
Hemoglobin: 10.8 g/dL — ABNORMAL LOW (ref 12.0–15.0)
Immature Granulocytes: 4 %
Lymphocytes Relative: 29 %
Lymphs Abs: 2.7 10*3/uL (ref 0.7–4.0)
MCH: 36.4 pg — ABNORMAL HIGH (ref 26.0–34.0)
MCHC: 31.8 g/dL (ref 30.0–36.0)
MCV: 114.5 fL — ABNORMAL HIGH (ref 80.0–100.0)
Monocytes Absolute: 1.3 10*3/uL — ABNORMAL HIGH (ref 0.1–1.0)
Monocytes Relative: 14 %
Neutro Abs: 4.7 10*3/uL (ref 1.7–7.7)
Neutrophils Relative %: 51 %
Platelet Count: 168 10*3/uL (ref 150–400)
RBC: 2.97 MIL/uL — ABNORMAL LOW (ref 3.87–5.11)
RDW: 17.4 % — ABNORMAL HIGH (ref 11.5–15.5)
WBC Count: 9.3 10*3/uL (ref 4.0–10.5)
nRBC: 0 % (ref 0.0–0.2)

## 2020-05-18 LAB — CMP (CANCER CENTER ONLY)
ALT: 38 U/L (ref 0–44)
AST: 51 U/L — ABNORMAL HIGH (ref 15–41)
Albumin: 3.8 g/dL (ref 3.5–5.0)
Alkaline Phosphatase: 184 U/L — ABNORMAL HIGH (ref 38–126)
Anion gap: 9 (ref 5–15)
BUN: 9 mg/dL (ref 8–23)
CO2: 23 mmol/L (ref 22–32)
Calcium: 9.6 mg/dL (ref 8.9–10.3)
Chloride: 109 mmol/L (ref 98–111)
Creatinine: 0.81 mg/dL (ref 0.44–1.00)
GFR, Estimated: >60 mL/min (ref >60)
Glucose, Bld: 117 mg/dL — ABNORMAL HIGH (ref 70–99)
Potassium: 4.5 mmol/L (ref 3.5–5.1)
Sodium: 141 mmol/L (ref 135–145)
Total Bilirubin: 1 mg/dL (ref 0.3–1.2)
Total Protein: 7.7 g/dL (ref 6.5–8.1)

## 2020-05-18 MED ORDER — SODIUM CHLORIDE 0.9% FLUSH
10.0000 mL | INTRAVENOUS | Status: DC | PRN
  Administered 2020-05-18: 10 mL
  Filled 2020-05-18: qty 10

## 2020-05-18 MED ORDER — APIXABAN 5 MG PO TABS: 5.0000 mg | ORAL_TABLET | Freq: Two times a day (BID) | ORAL | 3 refills | Status: DC

## 2020-05-18 MED ORDER — HEPARIN SOD (PORK) LOCK FLUSH 100 UNIT/ML IV SOLN
500.0000 [IU] | Freq: Once | INTRAVENOUS | Status: DC | PRN
  Filled 2020-05-18: qty 5

## 2020-05-18 MED ORDER — DEXTROSE 5 % IV SOLN
Freq: Once | INTRAVENOUS | Status: AC
  Filled 2020-05-18: qty 250

## 2020-05-18 MED ORDER — FLUOROURACIL CHEMO INJECTION 2.5 GM/50ML
400.0000 mg/m2 | Freq: Once | INTRAVENOUS | Status: AC
  Administered 2020-05-18: 900 mg via INTRAVENOUS
  Filled 2020-05-18: qty 18

## 2020-05-18 MED ORDER — PROCHLORPERAZINE MALEATE 10 MG PO TABS
ORAL_TABLET | ORAL | Status: AC
  Filled 2020-05-18: qty 1

## 2020-05-18 MED ORDER — PROCHLORPERAZINE MALEATE 10 MG PO TABS
10.0000 mg | ORAL_TABLET | Freq: Four times a day (QID) | ORAL | Status: DC | PRN
  Administered 2020-05-18: 10 mg via ORAL

## 2020-05-18 MED ORDER — LEUCOVORIN CALCIUM INJECTION 350 MG
400.0000 mg/m2 | Freq: Once | INTRAVENOUS | Status: AC
  Administered 2020-05-18: 904 mg via INTRAVENOUS
  Filled 2020-05-18: qty 45.2

## 2020-05-18 MED ORDER — SODIUM CHLORIDE 0.9% FLUSH
10.0000 mL | INTRAVENOUS | Status: DC | PRN
  Filled 2020-05-18: qty 10

## 2020-05-18 MED ORDER — FLUOROURACIL CHEMO INJECTION 5 GM/100ML
2400.0000 mg/m2 | INTRAVENOUS | Status: DC
  Administered 2020-05-18: 5400 mg via INTRAVENOUS
  Filled 2020-05-18: qty 108

## 2020-05-18 NOTE — Progress Notes (Signed)
[place]   Telephone:([phone]) [phone] Fax:([phone]) [phone]   Clinic Follow up Note   Patient Care Team: Hulan Fess, MD as PCP - General (Family Medicine) Excell Seltzer, MD (Inactive) as Consulting Physician (General Surgery) Rolm Bookbinder, MD as Consulting Physician (General Surgery) Thea Silversmith, MD as Consulting Physician (Radiation Oncology) Holley Bouche, NP (Inactive) as Nurse Practitioner (Nurse Practitioner) Truitt Merle, MD as Consulting Physician (Hematology) Jonnie Finner, RN as Oncology Nurse Navigator Leighton Ruff, MD as Consulting Physician (General Surgery) Nicholas Lose, MD as Consulting Physician (Hematology and Oncology) Leighton Ruff, MD as Consulting Physician (General Surgery) 05/18/2020  CHIEF COMPLAINT: Follow up colon cancer   SUMMARY OF ONCOLOGIC HISTORY: Oncology History Overview Note  Cancer Staging Breast cancer of upper-outer quadrant of left female breast [facility]) Staging form: Breast, AJCC 7th Edition - Clinical stage from 06/11/2014: Stage 0 (Tis (DCIS), N0, M0) - Unsigned - Pathologic stage from 07/03/2014: Stage Unknown (Tis (DCIS), NX, cM0) - Signed by Enid Cutter, MD on 07/10/2014 Staging comments: Staged on final lumpectomy specimen by Dr. Donato Heinz.  right colon cancer Staging form: Colon and Rectum, AJCC 8th Edition - Pathologic stage from 11/01/2019: Stage IIIB (pT3, pN2a, cM0) - Signed by Truitt Merle, MD on 11/29/2019    Malignant neoplasm of female breast ([place])  05/29/2014 Initial Biopsy   Left breast needle core biopsy: Grade 2, DCIS with calcs. ER+ (100%), PR+ (96%).    06/04/2014 Initial Diagnosis   Left breast DCIS with calcifications, ER 100%, PR 96%   06/10/2014 Breast MRI   Left breast: 2.4 x 1.3 x 1.1 cm area of patchy non-mass enhancement upper outer quadrant includes postbiopsy seroma; Right breast: 1.2 cm previously biopsied stable benign fibroadenoma   06/12/2014 Procedure   Genetic counseling/testing:  Identified 1 VUS on CHEK2 gene. Remainder of 17 gene panel tested negative and included: ATM, BARD1, BRCA 1/2, BRIP1, CDH1, CHEK2, EPCAM, MLH1, MSH2, MSH6, NBN, NF1, PALB2, PTEN, RAD50, RAD51C, RAD51D, STK11, and TP53.    07/01/2014 Surgery   Left breast lumpectomy ([facility]): Grade 1, DCIS, spanning 2.3 cm, 1 mm margin, ER 100%, PR 96%   07/31/2014 - 08/28/2014 Radiation Therapy   Adjuvant RT completed Pablo Ledger). Left breast: Total dose 42.5 Gy over 17 fractions. Left breast boost: Total dose 7.5 Gy over 3 fractions.    09/14/2014 - 01/13/2020 Anti-estrogen oral therapy   Anastrazole $RemoveBefo'1mg'rLuvwWhlAEV$  daily. Planned duration of treatment: 5 years [place]). Completed in 01/2020.    09/25/2014 Survivorship   Survivorship Care Plan given to patient and reviewed with her in person.    right colon cancer  09/19/2019 Imaging   CT AP W contrast 09/19/19  IMPRESSION Fullness in the cecum, cannot exclude a mass. No evidence for metastatic disease is identified.    09/24/2019 Procedure   Colonoscopy by Dr Eber Jones 09/24/19 IMPRESSION 1. The colon was redundant  2. Mild diverticulosis was noted through the entire examined colon 3. Single 71mm polyp was found in the ascending colon; polypectomy was performed using snare cautery and biopsy forceps 4. Mild diverticulosis was notes in the descending colon and sigmoid colon.  5. Single polyp was found in the sigmoid colon, polypectomy was performed with cold forceps.  6. Single polyp was found in the rectosigmoid colon; polypectomy was performed with cold snare  7. Small internal hemorrhoids  8. Large mass was found at the cecum; multiple biopsies of the area were performed using cold forceps; injection (tattooing) was performed distal to the mass.    09/24/2019  Initial Biopsy   INTERPRETATION AND DIAGNOSIS:  A. Cecum, biopsy:  Invasive moderately differentiated adenocarcinoma.  see comment  B. Polyp @ ascending colon, polypectomy:  Tubular Adenoma  C. Polyp @ sigmoid  colon Polypectomy:  hyperplastic polyp.  D. Polyp @ rectosigmoid colon, Polypectomy:  Hyperplastic Polyp      10/16/2019 Imaging   CT Chest IMPRESSION: 1. Multiple small pulmonary nodules measuring 5 mm or less in size in the lungs. These are nonspecific and are typically considered statistically likely benign. However, given the patient's history of primary malignancy, close attention on follow-up studies is recommended to ensure stability. 2. Aortic atherosclerosis, in addition to right coronary artery disease. Assessment for potential risk factor modification, dietary therapy or pharmacologic therapy may be warranted, if clinically indicated. 3. There are calcifications of the aortic valve and mitral annulus. Echocardiographic correlation for evaluation of potential valvular dysfunction may be warranted if clinically indicated. 4. Small hiatal hernia.   Aortic Atherosclerosis (ICD10-I70.0).   11/01/2019 Initial Diagnosis   Colon cancer ([place])   11/01/2019 Surgery   LAPAROSCOPIC PARTIAL COLECTOMY by Dr Marcello Moores and Dr Johney Maine   11/01/2019 Pathology Results   FINAL MICROSCOPIC DIAGNOSIS:   A. COLON, PROXIMAL RIGHT, COLECTOMY:  - Invasive colonic adenocarcinoma, 5 cm.  - Tumor invades through the muscularis propria into pericolonic tissues.   - Margins of resection are not involved.  - Metastatic carcinoma in (5) of (13) lymph nodes.  - See oncology table.    MSI Stable  Mismatch repair normal  MLH1 - Preserved nuclear expression (greater 50% tumor expression) MSH2 - Preserved nuclear expression (greater 50% tumor expression) MSH6 - Preserved nuclear expression (greater 50% tumor expression) PMS2 - Preserved nuclear expression (greater 50% tumor expression)   11/01/2019 Cancer Staging   Staging form: Colon and Rectum, AJCC 8th Edition - Pathologic stage from 11/01/2019: Stage IIIB (pT3, pN2a, cM0) - Signed by Truitt Merle, MD on 11/29/2019   12/10/2019 Procedure   PAC placed  12/10/19   12/17/2019 -  Chemotherapy   FOLFOX q2weeks starting in 2 weeks starting 12/17/19     CURRENT THERAPY: FOLFOX q2weeks starting in 2 weeksstarting 12/17/19. Held 01/27/20-02/10/20 due to b/l PE.  INTERVAL HISTORY: Ms. Tafolla returns for follow up and treatment as scheduled. She received cycle 10 FOLFOX on 05/06/19.  Her main side effect is progressive neuropathy which worsened in the last 2 cycles, left hand greater than right.  She can still function but with more effort.  This is very bothersome.  Takes gabapentin once at bedtime.  She stopped Lasix, continues potassium twice daily.  She has soft to loose stools up to twice per day, no nausea/vomiting.  Eating and drinking adequately, no mucositis.  She has a mild residual dry cough from URI in 03/2020, no fever/chills or chest pain.  No dyspnea, but not very active lately.  Denies any pain or bleeding. All other systems were reviewed with the patient and are negative.   MEDICAL HISTORY:  Past Medical History:  Diagnosis Date  . Aortic atherosclerosis ([place])   . Arthritis    feet, lower back  . Basal cell carcinoma    arm  . Breast cancer of upper-outer quadrant of left female breast ([place]) 06/04/2014  . Cataract    immature on the left  . Colon cancer ([place]) 08/2019  . Diabetes mellitus without complication ([place])   . Diverticulosis   . Dizziness    > 2yrs ago;took Antivert   . Family history of anesthesia complication  sister slow to wake up with anesthesia  . Family history of breast cancer   . Family history of colon cancer   . Family history of uterine cancer   . GERD (gastroesophageal reflux disease)    takes occasional TUMs  . History of bronchitis    > 83yrs ago  . History of colon polyps   . History of hiatal hernia    Small noted on CT  . Hypertension    takes Losartan daily and HCTZ  . Iron deficiency anemia   . Joint pain   . Numbness    to toes on each foot  . Peripheral neuropathy    feet and toes  .  Personal history of radiation therapy   . Pulmonary nodules    Noted on CT  . Radiation 07/31/14-08/28/14   Left Breast 20 fxs  . Seasonal allergies    takes Claritin prn  . Urinary frequency   . Vitamin D deficiency    takes VIt D daily    SURGICAL HISTORY: Past Surgical History:  Procedure Laterality Date  . BREAST BIOPSY Bilateral   . BREAST LUMPECTOMY Left   . BREAST LUMPECTOMY WITH RADIOACTIVE SEED LOCALIZATION Left 07/01/2014   Procedure: LEFT BREAST LUMPECTOMY WITH RADIOACTIVE SEED LOCALIZATION;  Surgeon: Excell Seltzer, MD;  Location: [place];  Service: General;  Laterality: Left;  . CATARACT EXTRACTION Right   . COLONOSCOPY    . LAPAROSCOPIC PARTIAL COLECTOMY N/A 11/01/2019   Procedure: LAPAROSCOPIC PARTIAL COLECTOMY;  Surgeon: Leighton Ruff, MD;  Location: [facility];  Service: General;  Laterality: N/A;  . PORTACATH PLACEMENT N/A 12/10/2019   Procedure: INSERTION PORT-A-CATH ULTRASOUND GUIDED IN RIGHT IJ;  Surgeon: Leighton Ruff, MD;  Location: [facility];  Service: General;  Laterality: N/A;  . TOTAL KNEE ARTHROPLASTY Left 10/24/2012   Procedure: TOTAL KNEE ARTHROPLASTY;  Surgeon: Kerin Salen, MD;  Location: [facility];  Service: Orthopedics;  Laterality: Left;  . TOTAL KNEE ARTHROPLASTY Right 01/09/2013   Procedure: TOTAL KNEE ARTHROPLASTY;  Surgeon: Kerin Salen, MD;  Location: [place];  Service: Orthopedics;  Laterality: Right;  . TUBAL LIGATION      I have reviewed the social history and family history with the patient and they are unchanged from previous note.  ALLERGIES:  is allergic to oxycodone.  MEDICATIONS:  Current Outpatient Medications  Medication Sig Dispense Refill  . Cholecalciferol (VITAMIN D) 2000 UNITS CAPS Take 2,000 Units by mouth daily.     . Ferrous Sulfate (SLOW FE PO) Take 1 tablet by mouth in the morning and at bedtime.     . gabapentin (NEURONTIN) 100 MG capsule Take 1 capsule by mouth at bedtime 30 capsule 0  . ibuprofen (ADVIL) 200  MG tablet Take 400 mg by mouth every 6 (six) hours as needed for moderate pain.     Marland Kitchen lidocaine-prilocaine (EMLA) cream Apply to affected area once (Patient taking differently: Apply 1 application topically daily as needed (port access).) 30 g 3  . loratadine (CLARITIN) 10 MG tablet Take 10 mg by mouth daily as needed for allergies.    Marland Kitchen losartan (COZAAR) 25 MG tablet Take 25 mg by mouth daily.    . metFORMIN (GLUCOPHAGE) 500 MG tablet Take 1 tablet (500 mg total) by mouth daily with breakfast. (Patient taking differently: Take 500 mg by mouth in the morning and at bedtime.)    . Multiple Vitamins-Minerals (MULTIVITAMIN WITH MINERALS) tablet Take 1 tablet by mouth daily.    Marland Kitchen omeprazole (  PRILOSEC) 20 MG capsule Take 20 mg by mouth daily before breakfast.     . ondansetron (ZOFRAN) 8 MG tablet Take 1 tablet (8 mg total) by mouth 2 (two) times daily as needed for refractory nausea / vomiting. Start on day 3 after chemotherapy. 30 tablet 1  . potassium chloride (KLOR-CON) 10 MEQ tablet Take 1 tablet (10 mEq total) by mouth 2 (two) times daily. 60 tablet 1  . pravastatin (PRAVACHOL) 10 MG tablet Take 10 mg by mouth daily.    . prochlorperazine (COMPAZINE) 10 MG tablet Take 1 tablet (10 mg total) by mouth every 6 (six) hours as needed (Nausea or vomiting). 30 tablet 1  . apixaban (ELIQUIS) 5 MG TABS tablet Take 1 tablet (5 mg total) by mouth 2 (two) times daily. Take 2 tablets ($RemoveBe'10mg'XNDgStIEx$ ) 2 times daily x 7 days, then 1 tablet ($RemoveB'5mg'bdsQmKRv$ ) 2 times daily 60 tablet 3   No current facility-administered medications for this visit.   Facility-Administered Medications Ordered in Other Visits  Medication Dose Route Frequency Provider Last Rate Last Admin  . fluorouracil (ADRUCIL) 5,400 mg in sodium chloride 0.9 % 142 mL chemo infusion  2,400 mg/m2 (Treatment Plan Recorded) Intravenous 1 day or 1 dose Truitt Merle, MD      . fluorouracil (ADRUCIL) chemo injection 900 mg  400 mg/m2 (Treatment Plan Recorded) Intravenous Once  Truitt Merle, MD      . heparin lock flush 100 unit/mL  500 Units Intracatheter Once PRN Truitt Merle, MD      . leucovorin 904 mg in dextrose 5 % 250 mL infusion  400 mg/m2 (Treatment Plan Recorded) Intravenous Once Truitt Merle, MD      . prochlorperazine (COMPAZINE) tablet 10 mg  10 mg Oral Q6H PRN Alla Feeling, NP      . sodium chloride flush (NS) 0.9 % injection 10 mL  10 mL Intracatheter PRN Truitt Merle, MD        PHYSICAL EXAMINATION: ECOG PERFORMANCE STATUS: 1 - Symptomatic but completely ambulatory  Vitals:   05/18/20 1140  BP: 132/61  Pulse: 74  Resp: 18  Temp: (!) 96.8 F (36 C)  SpO2: 99%   Filed Weights   05/18/20 1140  Weight: 231 lb 4.8 oz (104.9 kg)    GENERAL:alert, no distress and comfortable SKIN: No rash EYES: sclera clear LUNGS: clear with normal breathing effort HEART: regular rate & rhythm, no lower extremity edema ABDOMEN:abdomen soft, non-tender and normal bowel sounds NEURO: alert & oriented x 3 with fluent speech, moderately decreased vibratory sense over the left fingertips per tuning fork exam PAC without erythema  LABORATORY DATA:  I have reviewed the data as listed CBC Latest Ref Rng & Units 05/18/2020 05/05/2020 04/20/2020  WBC 4.0 - 10.5 K/uL 9.3 12.2(H) 5.9  Hemoglobin 12.0 - 15.0 g/dL 10.8(L) 10.5(L) 10.3(L)  Hematocrit 36.0 - 46.0 % 34.0(L) 33.4(L) 32.2(L)  Platelets 150 - 400 K/uL 168 174 136(L)     CMP Latest Ref Rng & Units 05/18/2020 05/05/2020 04/20/2020  Glucose 70 - 99 mg/dL 117(H) 114(H) 145(H)  BUN 8 - 23 mg/dL $Remove'9 11 10  'ORWFVZE$ Creatinine 0.44 - 1.00 mg/dL 0.81 0.87 0.85  Sodium 135 - 145 mmol/L 141 139 143  Potassium 3.5 - 5.1 mmol/L 4.5 4.7 3.7  Chloride 98 - 111 mmol/L 109 105 107  CO2 22 - 32 mmol/L $RemoveB'23 25 25  'kBWjMpcj$ Calcium 8.9 - 10.3 mg/dL 9.6 9.9 9.6  Total Protein 6.5 - 8.1 g/dL 7.7 7.5 7.7  Total Bilirubin  0.3 - 1.2 mg/dL 1.0 1.3(H) 1.1  Alkaline Phos 38 - 126 U/L 184(H) 158(H) 161(H)  AST 15 - 41 U/L 51(H) 66(H) 38  ALT 0 - 44 U/L 38 41  29      RADIOGRAPHIC STUDIES: I have personally reviewed the radiological images as listed and agreed with the findings in the report. No results found.   ASSESSMENT & PLAN: 75 yo female   1.Right colon cancer, pT3N2aM0 stage IIB, MSS -She was diagnosed in 08/2019 withcecal mass biopsy showed moderately differentiated adenocarcinoma. InitialCTAPshowed no evidence of lymph node or distant metastasis, or radiographic concern for bowel obstruction. -CT chest from 10/16/19 did shows multiple small lung nodules that were nonspecific but likely benign. Will monitor.  -She underwent colon surgery with Dr Marcello Moores on 11/01/19.Path showed5cm of invasive colonic adenocarcinoma that was completely resected, clear margins with 5/13 LNs. Overall Stage IIIB cancer.  -Given the lymph node involvement her recurrence risk is very high, Dr. Morey Hummingbird has recommended adjuvant chemotherapy for 6 months to reduce her recurrence risk. She previously discussed Capox versus FOLFOX and the patient consented to FOLFOX.The goal of chemo is curative. -The goal is 6 months of treatment if she can tolerate then proceed with surveillance including CT scan every 6 months for first 2-3 years;will monitor lung nodules -She has recovered very well from surgery.Herbaseline neuropathy willbemonitor closely on treatment -Beganadjuvant chemo FOLFOX cycle 1 on 12/17/2019,tolerated well. -chemo held 01/27/20 to 02/10/20 for bilateral PE, she recovered welland resumed adjuvant chemo -oxali dose reduced starting with C7 (70 mg/m2 --> 50 mg/m2 C8) for neuropathy and ultimately dc'd with cycle 11  2. Submassive bilateral PE, LE edema  -she developed progressive dyspnea after cycle 3 chemo, 01/27/20 CTA showed PE with right heart strain.Doppler was negative for DVT. She was treated with heparin and transition to Eliquis -Given that her blood clots were provoked on chemo, the plan is to stop Eliquis after 6 months of  treatment. However if she develops recurrent blood clot in the future,the recommendation would be for indefinite anticoagulation -tolerating eliquis, refilled 05/18/20  3.Sinus congestion, cough -completed Augmentin in 03/2020 -resolved except residual mild dry cough   4.Iron deficient anemia -Her 10/03/19 Iron panel showed, Ferritin 13, Iron 34, sat ratios 8. Overall consistent with iron deficiency. -She did require blood transfusion on 11/06/19 after colon surgery. -Takes 2 tabs Slow Fe iron daily -Iron studiesnormalized, remain WNL and stable -Can reduce to 1 tab daily  5.H/o left breast DCIS, G2, ER/PR+ -Dx in 05/2014.S/p left lumpectomy with Dr Excell Seltzer, adjuvant RT with Dr Pablo Ledger. Shecompleted adjuvantAnastrozole 08/2014-01/2020,toleratedwell. -Bone density 06/09/2015 T score +1.1 normal. Plan to repeat DEXA in 2021. -We will continue long-term surveillance with [place]  6. Comorbidities: Arthritis, DM, HTN, GERD -f/u with PCP Dr. Rex Kras -BG stable 100-200 on chemo  Disposition: Ms. Benn appears stable.  She completed 10 cycles of adjuvant FOLFOX.  She tolerates treatment well without significant toxicities except progressive neuropathy despite dose-reduced oxaliplatin.  I recommend to increase gabapentin to 100 mg 3 times daily.  We will hold oxaliplatin for her last 2 cycles.  Labs reviewed.  CBC and CMP stable with persistent mild transaminitis, likely related to oxaliplatin.  Adequate to proceed with cycle 11 chemo today with 5-FU/leucovorin.  We will change premed to Compazine, and remove GCSF.   Return in 2 weeks for follow-up and final cycle 12.   All questions were answered. The patient knows to call the clinic with any problems, questions or concerns. No  barriers to learning were detected.      Alla Feeling, NP 05/18/20

## 2020-05-18 NOTE — Patient Instructions (Signed)
[facility] Discharge Instructions for Patients Receiving Chemotherapy  Today you received the following chemotherapy agents leucovorin; 5-FU  To help prevent nausea and vomiting after your treatment, we encourage you to take your nausea medication as directed    If you develop nausea and vomiting that is not controlled by your nausea medication, call the clinic.   BELOW ARE SYMPTOMS THAT SHOULD BE REPORTED IMMEDIATELY:  *FEVER GREATER THAN 100.5 F  *CHILLS WITH OR WITHOUT FEVER  NAUSEA AND VOMITING THAT IS NOT CONTROLLED WITH YOUR NAUSEA MEDICATION  *UNUSUAL SHORTNESS OF BREATH  *UNUSUAL BRUISING OR BLEEDING  TENDERNESS IN MOUTH AND THROAT WITH OR WITHOUT PRESENCE OF ULCERS  *URINARY PROBLEMS  *BOWEL PROBLEMS  UNUSUAL RASH Items with * indicate a potential emergency and should be followed up as soon as possible.  Feel free to call the clinic should you have any questions or concerns. The clinic phone number is [phone].  Please show the CHEMO ALERT CARD at check-in to the Emergency Department and triage nurse.     

## 2020-05-20 ENCOUNTER — Inpatient Hospital Stay: Payer: Medicare Other

## 2020-05-20 ENCOUNTER — Other Ambulatory Visit: Payer: Self-pay

## 2020-05-20 ENCOUNTER — Telehealth: Payer: Self-pay

## 2020-05-20 ENCOUNTER — Other Ambulatory Visit: Payer: Self-pay | Admitting: Nurse Practitioner

## 2020-05-20 VITALS — BP 132/72 | HR 93 | Temp 98.5°F

## 2020-05-20 DIAGNOSIS — Z95828 Presence of other vascular implants and grafts: Secondary | ICD-10-CM

## 2020-05-20 DIAGNOSIS — C182 Malignant neoplasm of ascending colon: Secondary | ICD-10-CM | POA: Diagnosis not present

## 2020-05-20 MED ORDER — HEPARIN SOD (PORK) LOCK FLUSH 100 UNIT/ML IV SOLN
500.0000 [IU] | Freq: Once | INTRAVENOUS | Status: AC | PRN
  Administered 2020-05-20: 500 [IU]
  Filled 2020-05-20: qty 5

## 2020-05-20 MED ORDER — SODIUM CHLORIDE 0.9% FLUSH
10.0000 mL | INTRAVENOUS | Status: DC | PRN
  Administered 2020-05-20: 10 mL
  Filled 2020-05-20: qty 10

## 2020-05-20 MED ORDER — APIXABAN 5 MG PO TABS: 5.0000 mg | ORAL_TABLET | Freq: Two times a day (BID) | ORAL | 3 refills | Status: DC

## 2020-05-20 NOTE — Telephone Encounter (Signed)
Kim Castro called stating that her pharmacy will not fill her Eliquis prescription as there is something wrong with the prescription instructions

## 2020-05-29 NOTE — Progress Notes (Signed)
[place]   [phone] [phone]   Clinic Follow up Note   Patient Care Team: Hulan Fess, MD as PCP - General (Family Medicine) Excell Seltzer, MD (Inactive) as Consulting Physician (General Surgery) Rolm Bookbinder, MD as Consulting Physician (General Surgery) Thea Silversmith, MD as Consulting Physician (Radiation Oncology) Holley Bouche, NP (Inactive) as Nurse Practitioner (Nurse Practitioner) Truitt Merle, MD as Consulting Physician (Hematology) Jonnie Finner, RN as Oncology Nurse Navigator Leighton Ruff, MD as Consulting Physician (General Surgery) Nicholas Lose, MD as Consulting Physician (Hematology and Oncology) Leighton Ruff, MD as Consulting Physician (General Surgery)  Date of Service: 06/01/2020  CHIEF COMPLAINT: F/u of colon cancer, B/l PE  SUMMARY OF ONCOLOGIC HISTORY: Oncology History Overview Note  Cancer Staging Breast cancer of upper-outer quadrant of left female breast [facility]) Staging form: Breast, AJCC 7th Edition - Clinical stage from 06/11/2014: Stage 0 (Tis (DCIS), N0, M0) - Unsigned - Pathologic stage from 07/03/2014: Stage Unknown (Tis (DCIS), NX, cM0) - Signed by Enid Cutter, MD on 07/10/2014 Staging comments: Staged on final lumpectomy specimen by Dr. Donato Heinz.  right colon cancer Staging form: Colon and Rectum, AJCC 8th Edition - Pathologic stage from 11/01/2019: Stage IIIB (pT3, pN2a, cM0) - Signed by Truitt Merle, MD on 11/29/2019    Malignant neoplasm of female breast ([place])  05/29/2014 Initial Biopsy   Left breast needle core biopsy: Grade 2, DCIS with calcs. ER+ (100%), PR+ (96%).    06/04/2014 Initial Diagnosis   Left breast DCIS with calcifications, ER 100%, PR 96%   06/10/2014 Breast MRI   Left breast: 2.4 x 1.3 x 1.1 cm area of patchy non-mass enhancement upper outer quadrant includes postbiopsy seroma; Right breast: 1.2 cm previously biopsied stable benign fibroadenoma   06/12/2014 Procedure    Genetic counseling/testing: Identified 1 VUS on CHEK2 gene. Remainder of 17 gene panel tested negative and included: ATM, BARD1, BRCA 1/2, BRIP1, CDH1, CHEK2, EPCAM, MLH1, MSH2, MSH6, NBN, NF1, PALB2, PTEN, RAD50, RAD51C, RAD51D, STK11, and TP53.    07/01/2014 Surgery   Left breast lumpectomy ([place]): Grade 1, DCIS, spanning 2.3 cm, 1 mm margin, ER 100%, PR 96%   07/31/2014 - 08/28/2014 Radiation Therapy   Adjuvant RT completed Pablo Ledger). Left breast: Total dose 42.5 Gy over 17 fractions. Left breast boost: Total dose 7.5 Gy over 3 fractions.    09/14/2014 - 01/13/2020 Anti-estrogen oral therapy   Anastrazole $RemoveBefo'1mg'rpWpgobhFZh$  daily. Planned duration of treatment: 5 years [place]). Completed in 01/2020.    09/25/2014 Survivorship   Survivorship Care Plan given to patient and reviewed with her in person.    right colon cancer  09/19/2019 Imaging   CT AP W contrast 09/19/19  IMPRESSION Fullness in the cecum, cannot exclude a mass. No evidence for metastatic disease is identified.    09/24/2019 Procedure   Colonoscopy by Dr Eber Jones 09/24/19 IMPRESSION 1. The colon was redundant  2. Mild diverticulosis was noted through the entire examined colon 3. Single 57mm polyp was found in the ascending colon; polypectomy was performed using snare cautery and biopsy forceps 4. Mild diverticulosis was notes in the descending colon and sigmoid colon.  5. Single polyp was found in the sigmoid colon, polypectomy was performed with cold forceps.  6. Single polyp was found in the rectosigmoid colon; polypectomy was performed with cold snare  7. Small internal hemorrhoids  8. Large mass was found at the cecum; multiple biopsies of the area were performed using cold forceps; injection (tattooing) was performed distal to the  mass.    09/24/2019 Initial Biopsy   INTERPRETATION AND DIAGNOSIS:  A. Cecum, biopsy:  Invasive moderately differentiated adenocarcinoma.  see comment  B. Polyp @ ascending colon, polypectomy:  Tubular  Adenoma  C. Polyp @ sigmoid colon Polypectomy:  hyperplastic polyp.  D. Polyp @ rectosigmoid colon, Polypectomy:  Hyperplastic Polyp      10/16/2019 Imaging   CT Chest IMPRESSION: 1. Multiple small pulmonary nodules measuring 5 mm or less in size in the lungs. These are nonspecific and are typically considered statistically likely benign. However, given the patient's history of primary malignancy, close attention on follow-up studies is recommended to ensure stability. 2. Aortic atherosclerosis, in addition to right coronary artery disease. Assessment for potential risk factor modification, dietary therapy or pharmacologic therapy may be warranted, if clinically indicated. 3. There are calcifications of the aortic valve and mitral annulus. Echocardiographic correlation for evaluation of potential valvular dysfunction may be warranted if clinically indicated. 4. Small hiatal hernia.   Aortic Atherosclerosis (ICD10-I70.0).   11/01/2019 Initial Diagnosis   Colon cancer ([place])   11/01/2019 Surgery   LAPAROSCOPIC PARTIAL COLECTOMY by Dr Marcello Moores and Dr Johney Maine   11/01/2019 Pathology Results   FINAL MICROSCOPIC DIAGNOSIS:   A. COLON, PROXIMAL RIGHT, COLECTOMY:  - Invasive colonic adenocarcinoma, 5 cm.  - Tumor invades through the muscularis propria into pericolonic tissues.   - Margins of resection are not involved.  - Metastatic carcinoma in (5) of (13) lymph nodes.  - See oncology table.    MSI Stable  Mismatch repair normal  MLH1 - Preserved nuclear expression (greater 50% tumor expression) MSH2 - Preserved nuclear expression (greater 50% tumor expression) MSH6 - Preserved nuclear expression (greater 50% tumor expression) PMS2 - Preserved nuclear expression (greater 50% tumor expression)   11/01/2019 Cancer Staging   Staging form: Colon and Rectum, AJCC 8th Edition - Pathologic stage from 11/01/2019: Stage IIIB (pT3, pN2a, cM0) - Signed by Truitt Merle, MD on 11/29/2019   12/10/2019  Procedure   PAC placed 12/10/19   12/17/2019 - 06/01/2020 Chemotherapy   FOLFOX q2weeks starting in 2 weeks starting 12/17/19. Held 01/27/20-02/10/20 due to b/l PE. Oxaliplatin held C11-12 due to neuropathy. Completed on 06/01/20      CURRENT THERAPY:  FOLFOX q2weeks starting in 2 weeksstarting 12/17/19. Held 01/27/20-02/10/20 due to b/l PE.Oxaliplatin held C11-12 due to neuropathy. Completed on 06/01/20  INTERVAL HISTORY:  Ourania Hamler is here for a follow up. She presents to the clinic alone. She notes her last cycle chemo went well and was easier without Oxaliplatin. She notes her energy is fair. She moves slower in the evening. She notes with stair or walking an incline she can be SOB. She does not require stopping to catch her breath. She notes neuropathy in her left hand is worse. She is on Gabapentin $RemoveBefor'100mg'zIfAWLtMqKZu$  BID.     REVIEW OF SYSTEMS:   Constitutional: Denies fevers, chills or abnormal weight loss Eyes: Denies blurriness of vision Ears, nose, mouth, throat, and face: Denies mucositis or sore throat Respiratory: Denies cough, dyspnea or wheezes Cardiovascular: Denies palpitation, chest discomfort or lower extremity swelling Gastrointestinal:  Denies nausea, heartburn or change in bowel habits Skin: Denies abnormal skin rashes Lymphatics: Denies new lymphadenopathy or easy bruising Neurological: (+) Neuropathy in hands, L>R.  Behavioral/Psych: Mood is stable, no new changes  All other systems were reviewed with the patient and are negative.  MEDICAL HISTORY:  Past Medical History:  Diagnosis Date  . Aortic atherosclerosis ([place])   . Arthritis  feet, lower back  . Basal cell carcinoma    arm  . Breast cancer of upper-outer quadrant of left female breast ([facility]) 06/04/2014  . Cataract    immature on the left  . Colon cancer ([facility]) 08/2019  . Diabetes mellitus without complication ([facility])   . Diverticulosis   . Dizziness    > 68yrs ago;took Antivert   . Family history of  anesthesia complication    sister slow to wake up with anesthesia  . Family history of breast cancer   . Family history of colon cancer   . Family history of uterine cancer   . GERD (gastroesophageal reflux disease)    takes occasional TUMs  . History of bronchitis    > 60yrs ago  . History of colon polyps   . History of hiatal hernia    Small noted on CT  . Hypertension    takes Losartan daily and HCTZ  . Iron deficiency anemia   . Joint pain   . Numbness    to toes on each foot  . Peripheral neuropathy    feet and toes  . Personal history of radiation therapy   . Pulmonary nodules    Noted on CT  . Radiation 07/31/14-08/28/14   Left Breast 20 fxs  . Seasonal allergies    takes Claritin prn  . Urinary frequency   . Vitamin D deficiency    takes VIt D daily    SURGICAL HISTORY: Past Surgical History:  Procedure Laterality Date  . BREAST BIOPSY Bilateral   . BREAST LUMPECTOMY Left   . BREAST LUMPECTOMY WITH RADIOACTIVE SEED LOCALIZATION Left 07/01/2014   Procedure: LEFT BREAST LUMPECTOMY WITH RADIOACTIVE SEED LOCALIZATION;  Surgeon: Glenna Fellows, MD;  Location: [facility];  Service: General;  Laterality: Left;  . CATARACT EXTRACTION Right   . COLONOSCOPY    . LAPAROSCOPIC PARTIAL COLECTOMY N/A 11/01/2019   Procedure: LAPAROSCOPIC PARTIAL COLECTOMY;  Surgeon: [name], MD;  Location: [facility];  Service: General;  Laterality: N/A;  . PORTACATH PLACEMENT N/A 12/10/2019   Procedure: INSERTION PORT-A-CATH ULTRASOUND GUIDED IN RIGHT IJ;  Surgeon: [name], MD;  Location: [facility];  Service: General;  Laterality: N/A;  . TOTAL KNEE ARTHROPLASTY Left 10/24/2012   Procedure: TOTAL KNEE ARTHROPLASTY;  Surgeon: Nestor Lewandowsky, MD;  Location: [facility];  Service: Orthopedics;  Laterality: Left;  . TOTAL KNEE ARTHROPLASTY Right 01/09/2013   Procedure: TOTAL KNEE ARTHROPLASTY;  Surgeon: Nestor Lewandowsky, MD;  Location: [facility];  Service: Orthopedics;  Laterality: Right;  .  TUBAL LIGATION      I have reviewed the social history and family history with the patient and they are unchanged from previous note.  ALLERGIES:  is allergic to oxycodone.  MEDICATIONS:  Current Outpatient Medications  Medication Sig Dispense Refill  . apixaban (ELIQUIS) 5 MG TABS tablet Take 1 tablet (5 mg total) by mouth 2 (two) times daily. 60 tablet 3  . Cholecalciferol (VITAMIN D) 2000 UNITS CAPS Take 2,000 Units by mouth daily.     . Ferrous Sulfate (SLOW FE PO) Take 1 tablet by mouth in the morning and at bedtime.     Marland Kitchen ibuprofen (ADVIL) 200 MG tablet Take 400 mg by mouth every 6 (six) hours as needed for moderate pain.     Marland Kitchen lidocaine-prilocaine (EMLA) cream Apply to affected area once (Patient taking differently: Apply 1 application topically daily as needed (port access).) 30 g 3  . loratadine (CLARITIN) 10 MG tablet  Take 10 mg by mouth daily as needed for allergies.    Marland Kitchen losartan (COZAAR) 25 MG tablet Take 25 mg by mouth daily.    . metFORMIN (GLUCOPHAGE) 500 MG tablet Take 1 tablet (500 mg total) by mouth daily with breakfast. (Patient taking differently: Take 500 mg by mouth in the morning and at bedtime.)    . Multiple Vitamins-Minerals (MULTIVITAMIN WITH MINERALS) tablet Take 1 tablet by mouth daily.    Marland Kitchen omeprazole (PRILOSEC) 20 MG capsule Take 20 mg by mouth daily before breakfast.     . ondansetron (ZOFRAN) 8 MG tablet Take 1 tablet (8 mg total) by mouth 2 (two) times daily as needed for refractory nausea / vomiting. Start on day 3 after chemotherapy. 30 tablet 1  . potassium chloride (KLOR-CON) 10 MEQ tablet Take 1 tablet (10 mEq total) by mouth 2 (two) times daily. 60 tablet 1  . pravastatin (PRAVACHOL) 10 MG tablet Take 10 mg by mouth daily.    . prochlorperazine (COMPAZINE) 10 MG tablet Take 1 tablet (10 mg total) by mouth every 6 (six) hours as needed (Nausea or vomiting). 30 tablet 1  . gabapentin (NEURONTIN) 100 MG capsule Take 1 capsule (100 mg total) by mouth 3  (three) times daily. 90 capsule 1   No current facility-administered medications for this visit.    PHYSICAL EXAMINATION: ECOG PERFORMANCE STATUS: 1 - Symptomatic but completely ambulatory  Vitals:   06/01/20 1110  BP: (!) 142/58  Pulse: 77  Resp: 16  Temp: (!) 97.4 F (36.3 C)  SpO2: 98%   Filed Weights   06/01/20 1110  Weight: 232 lb 1.6 oz (105.3 kg)    Due to COVID19 we will limit examination to appearance. Patient had no complaints.  GENERAL:alert, no distress and comfortable SKIN: skin color normal, no rashes or significant lesions EYES: normal, Conjunctiva are pink and non-injected, sclera clear  NEURO: alert & oriented x 3 with fluent speech   LABORATORY DATA:  I have reviewed the data as listed CBC Latest Ref Rng & Units 06/01/2020 05/18/2020 05/05/2020  WBC 4.0 - 10.5 K/uL 3.6(L) 9.3 12.2(H)  Hemoglobin 12.0 - 15.0 g/dL 10.1(L) 10.8(L) 10.5(L)  Hematocrit 36.0 - 46.0 % 32.0(L) 34.0(L) 33.4(L)  Platelets 150 - 400 K/uL 160 168 174     CMP Latest Ref Rng & Units 06/01/2020 05/18/2020 05/05/2020  Glucose 70 - 99 mg/dL 180(H) 117(H) 114(H)  BUN 8 - 23 mg/dL $Remove'13 9 11  'zOzxzNR$ Creatinine 0.44 - 1.00 mg/dL 0.79 0.81 0.87  Sodium 135 - 145 mmol/L 142 141 139  Potassium 3.5 - 5.1 mmol/L 4.1 4.5 4.7  Chloride 98 - 111 mmol/L 110 109 105  CO2 22 - 32 mmol/L $RemoveB'24 23 25  'UpIevhqP$ Calcium 8.9 - 10.3 mg/dL 9.4 9.6 9.9  Total Protein 6.5 - 8.1 g/dL 7.2 7.7 7.5  Total Bilirubin 0.3 - 1.2 mg/dL 1.2 1.0 1.3(H)  Alkaline Phos 38 - 126 U/L 143(H) 184(H) 158(H)  AST 15 - 41 U/L 61(H) 51(H) 66(H)  ALT 0 - 44 U/L 45(H) 38 41      RADIOGRAPHIC STUDIES: I have personally reviewed the radiological images as listed and agreed with the findings in the report. No results found.   ASSESSMENT & PLAN:  Kim Castro is a 75 y.o. female with   1.Right colon cancer, pT3N2aM0 stage IIB, MSS -She was diagnosed in 08/2019 withcecal mass biopsy showed moderately differentiatedadenocarcinoma.CT chest  from 10/16/19 did shows multiple small lung nodules that were nonspecific but likely  benign. Will monitor.  -She underwent colon surgery with Dr Marcello Moores on 11/01/19.Path showed overall Stage IIIB cancer. -I started her onadjuvant chemotherapy for 6 months to reduce her risk of recurrencewith FOLFOXq2weeks beginning 12/17/19.Based on neuropathy,Icanswitch her 5FU to Oral Xeloda single agent. FOLFOX held 01/27/20-02/10/20 due to b/l PE. I discussed this was likely related to her chemo.Oxaliplatin held with C11-12 due to neuropathy.  -Labs reviewed and adequate to proceed with Final cycle 5FU, Leucovorin today.  -I discussed the risk of cancer recurrence in the future. I discussed the surveillance plan, which is a physical exam and lab test (including CBC, CMP and CEA) every 3 months for the first 2 years, then every 6-12 months, colonoscopy in one year, and surveilliance CT scan every 6-12 month for up to 5 year.  -She will keep her PAC for at least a year, will continue port flushes every 6-8 weeks. She is agreeable.  -F/u in 3 months with first surveillance scan.    2. Neuropathy, secondary to Oxaliplatin G2 -S/p C7 she started having numbness in hands, L>R which become prolonged with tingling s/p C10.  -Oxaliplatin dose reduced and ultimately held with C11-12.  -Continue Gabapentin 100mg , can increase to TID (06/01/20). Should improve off chemo.   3.Iron deficient anemia -Her 10/03/19 Iron panel showed, Ferritin 13, Iron 34, sat ratios 8. Overall consistent with iron deficiency. -She did require blood transfusion on 11/06/19 after colon surgery. -Currently on2 tabs Slow Fe iron daily -Anemia mild and stable lately.  4.H/o left breast DCIS, G2, ER/PR+ -Dx in 05/2014. Treated with left lumpectomy with Dr Excell Seltzer, adjuvant RT with Dr Pablo Ledger. She has been on Anastrozole 08/2014-01/13/20.She waspreviouslyunder the care ofDr [place].  -Bone density 06/09/2015 T score +1.1 normal. Plan to  repeat DEXA in 2021. -I will take over her breast cancer care given she is on long term surveillance.  5.Submassive bilateral pulmonary embolism, LE Edema -S/p C3 chemo, her 9/27/21CTA showed PE with right heart strain.She also has bilateral lower extremity edemaand Doppler ultrasound was negative for DVT bilaterally.She was treated withHeparin and she has been transitioned to [facility] discharge.She notes $75 copay with Eliquis but can manage for now.  -I discussed given her blood clots were provoked plan to stop Eliquis after 6 months of treatment. If she hasrecurrentblood clotin future, I would recommend continuinganticoagulation indefinitely -She has been on Lasix and oral potassium BID for LE Edema. If her LE improves or resolves she can reduce or stop lasix and potassium, then use as needed.  5. Comorbidities: Arthritis, DM, HTN, GERD -f/u with PCP Dr. Rex Kras   PLAN: -I refilled Gabapentin 100mg  to increase to TID.  -Labs reviewed and adequate to proceed with C12 chemo with 5FU bolus and Leucovorin -Continue Eliquis -Flush in 6 weeks  -F/u in 3 months with lab, flush, CT CAP w contrast a few days before.    No problem-specific Assessment & Plan notes found for this encounter.   Orders Placed This Encounter  Procedures  . CT CHEST ABDOMEN PELVIS W CONTRAST    Standing Status:   Future    Standing Expiration Date:   06/02/2021    Order Specific Question:   If indicated for the ordered procedure, I authorize the administration of contrast media per Radiology protocol    Answer:   Yes    Order Specific Question:   Preferred imaging location?    Answer:   [facility]    Order Specific Question:   Release to patient  Answer:   Immediate    Order Specific Question:   Is Oral Contrast requested for this exam?    Answer:   Yes, Per Radiology protocol    Order Specific Question:   Reason for Exam (SYMPTOM  OR DIAGNOSIS REQUIRED)    Answer:   RULE OUT  recurrence   All questions were answered. The patient knows to call the clinic with any problems, questions or concerns. No barriers to learning was detected. The total time spent in the appointment was 30 minutes.     Truitt Merle, MD 06/01/2020  I, Joslyn Devon, am acting as scribe for Truitt Merle, MD.   I have reviewed the above documentation for accuracy and completeness, and I agree with the above.

## 2020-06-01 ENCOUNTER — Other Ambulatory Visit: Payer: Self-pay

## 2020-06-01 ENCOUNTER — Inpatient Hospital Stay: Payer: Medicare Other

## 2020-06-01 ENCOUNTER — Inpatient Hospital Stay: Payer: Medicare Other | Admitting: Hematology

## 2020-06-01 VITALS — BP 142/58 | HR 77 | Temp 97.4°F | Resp 16 | Ht 66.0 in | Wt 232.1 lb

## 2020-06-01 DIAGNOSIS — M1990 Unspecified osteoarthritis, unspecified site: Secondary | ICD-10-CM | POA: Diagnosis not present

## 2020-06-01 DIAGNOSIS — E785 Hyperlipidemia, unspecified: Secondary | ICD-10-CM

## 2020-06-01 DIAGNOSIS — Z86000 Personal history of in-situ neoplasm of breast: Secondary | ICD-10-CM | POA: Diagnosis not present

## 2020-06-01 DIAGNOSIS — E1136 Type 2 diabetes mellitus with diabetic cataract: Secondary | ICD-10-CM | POA: Diagnosis not present

## 2020-06-01 DIAGNOSIS — R918 Other nonspecific abnormal finding of lung field: Secondary | ICD-10-CM | POA: Diagnosis not present

## 2020-06-01 DIAGNOSIS — Z170 Estrogen receptor positive status [ER+]: Secondary | ICD-10-CM | POA: Diagnosis not present

## 2020-06-01 DIAGNOSIS — K219 Gastro-esophageal reflux disease without esophagitis: Secondary | ICD-10-CM | POA: Diagnosis not present

## 2020-06-01 DIAGNOSIS — Z5189 Encounter for other specified aftercare: Secondary | ICD-10-CM | POA: Diagnosis not present

## 2020-06-01 DIAGNOSIS — C182 Malignant neoplasm of ascending colon: Secondary | ICD-10-CM

## 2020-06-01 DIAGNOSIS — C50919 Malignant neoplasm of unspecified site of unspecified female breast: Secondary | ICD-10-CM

## 2020-06-01 DIAGNOSIS — Z9223 Personal history of estrogen therapy: Secondary | ICD-10-CM | POA: Diagnosis not present

## 2020-06-01 DIAGNOSIS — Z5111 Encounter for antineoplastic chemotherapy: Secondary | ICD-10-CM | POA: Diagnosis not present

## 2020-06-01 DIAGNOSIS — Z923 Personal history of irradiation: Secondary | ICD-10-CM | POA: Diagnosis not present

## 2020-06-01 DIAGNOSIS — D509 Iron deficiency anemia, unspecified: Secondary | ICD-10-CM | POA: Diagnosis not present

## 2020-06-01 DIAGNOSIS — I2510 Atherosclerotic heart disease of native coronary artery without angina pectoris: Secondary | ICD-10-CM | POA: Diagnosis not present

## 2020-06-01 DIAGNOSIS — E1169 Type 2 diabetes mellitus with other specified complication: Secondary | ICD-10-CM

## 2020-06-01 DIAGNOSIS — I10 Essential (primary) hypertension: Secondary | ICD-10-CM | POA: Diagnosis not present

## 2020-06-01 DIAGNOSIS — I700 Atherosclerosis of aorta: Secondary | ICD-10-CM | POA: Diagnosis not present

## 2020-06-01 DIAGNOSIS — E1140 Type 2 diabetes mellitus with diabetic neuropathy, unspecified: Secondary | ICD-10-CM | POA: Diagnosis not present

## 2020-06-01 LAB — CBC WITH DIFFERENTIAL (CANCER CENTER ONLY)
Abs Immature Granulocytes: 0 10*3/uL (ref 0.00–0.07)
Basophils Absolute: 0 10*3/uL (ref 0.0–0.1)
Basophils Relative: 1 %
Eosinophils Absolute: 0.1 10*3/uL (ref 0.0–0.5)
Eosinophils Relative: 3 %
HCT: 32 % — ABNORMAL LOW (ref 36.0–46.0)
Hemoglobin: 10.1 g/dL — ABNORMAL LOW (ref 12.0–15.0)
Immature Granulocytes: 0 %
Lymphocytes Relative: 42 %
Lymphs Abs: 1.6 10*3/uL (ref 0.7–4.0)
MCH: 35.7 pg — ABNORMAL HIGH (ref 26.0–34.0)
MCHC: 31.6 g/dL (ref 30.0–36.0)
MCV: 113.1 fL — ABNORMAL HIGH (ref 80.0–100.0)
Monocytes Absolute: 0.4 10*3/uL (ref 0.1–1.0)
Monocytes Relative: 12 %
Neutro Abs: 1.5 10*3/uL — ABNORMAL LOW (ref 1.7–7.7)
Neutrophils Relative %: 42 %
Platelet Count: 160 10*3/uL (ref 150–400)
RBC: 2.83 MIL/uL — ABNORMAL LOW (ref 3.87–5.11)
RDW: 15.9 % — ABNORMAL HIGH (ref 11.5–15.5)
WBC Count: 3.6 10*3/uL — ABNORMAL LOW (ref 4.0–10.5)
nRBC: 0 % (ref 0.0–0.2)

## 2020-06-01 LAB — CMP (CANCER CENTER ONLY)
ALT: 45 U/L — ABNORMAL HIGH (ref 0–44)
AST: 61 U/L — ABNORMAL HIGH (ref 15–41)
Albumin: 3.6 g/dL (ref 3.5–5.0)
Alkaline Phosphatase: 143 U/L — ABNORMAL HIGH (ref 38–126)
Anion gap: 8 (ref 5–15)
BUN: 13 mg/dL (ref 8–23)
CO2: 24 mmol/L (ref 22–32)
Calcium: 9.4 mg/dL (ref 8.9–10.3)
Chloride: 110 mmol/L (ref 98–111)
Creatinine: 0.79 mg/dL (ref 0.44–1.00)
GFR, Estimated: >60 mL/min (ref >60)
Glucose, Bld: 180 mg/dL — ABNORMAL HIGH (ref 70–99)
Potassium: 4.1 mmol/L (ref 3.5–5.1)
Sodium: 142 mmol/L (ref 135–145)
Total Bilirubin: 1.2 mg/dL (ref 0.3–1.2)
Total Protein: 7.2 g/dL (ref 6.5–8.1)

## 2020-06-01 MED ORDER — SODIUM CHLORIDE 0.9 % IV SOLN
2400.0000 mg/m2 | INTRAVENOUS | Status: DC
  Administered 2020-06-01: 5400 mg via INTRAVENOUS
  Filled 2020-06-01: qty 108

## 2020-06-01 MED ORDER — FLUOROURACIL CHEMO INJECTION 2.5 GM/50ML
400.0000 mg/m2 | Freq: Once | INTRAVENOUS | Status: AC
  Administered 2020-06-01: 900 mg via INTRAVENOUS
  Filled 2020-06-01: qty 18

## 2020-06-01 MED ORDER — GABAPENTIN 100 MG PO CAPS: 100.0000 mg | ORAL_CAPSULE | Freq: Three times a day (TID) | ORAL | 1 refills | Status: DC

## 2020-06-01 MED ORDER — PROCHLORPERAZINE MALEATE 10 MG PO TABS
10.0000 mg | ORAL_TABLET | Freq: Four times a day (QID) | ORAL | Status: DC | PRN
  Administered 2020-06-01: 10 mg via ORAL

## 2020-06-01 MED ORDER — PROCHLORPERAZINE MALEATE 10 MG PO TABS
ORAL_TABLET | ORAL | Status: AC
  Filled 2020-06-01: qty 1

## 2020-06-01 MED ORDER — LEUCOVORIN CALCIUM INJECTION 350 MG
400.0000 mg/m2 | Freq: Once | INTRAVENOUS | Status: AC
  Administered 2020-06-01: 904 mg via INTRAVENOUS
  Filled 2020-06-01: qty 45.2

## 2020-06-01 MED ORDER — SODIUM CHLORIDE 0.9% FLUSH
10.0000 mL | INTRAVENOUS | Status: DC | PRN
  Filled 2020-06-01: qty 10

## 2020-06-01 MED ORDER — HEPARIN SOD (PORK) LOCK FLUSH 100 UNIT/ML IV SOLN
500.0000 [IU] | Freq: Once | INTRAVENOUS | Status: DC | PRN
  Filled 2020-06-01: qty 5

## 2020-06-01 MED ORDER — DEXTROSE 5 % IV SOLN
Freq: Once | INTRAVENOUS | Status: AC
  Filled 2020-06-01: qty 250

## 2020-06-01 NOTE — Patient Instructions (Signed)
[place] Discharge Instructions for Patients Receiving Chemotherapy  Today you received the following chemotherapy agents Leukovorin, Florouracil  To help prevent nausea and vomiting after your treatment, we encourage you to take your nausea medication as directed.   If you develop nausea and vomiting that is not controlled by your nausea medication, call the clinic.   BELOW ARE SYMPTOMS THAT SHOULD BE REPORTED IMMEDIATELY:  *FEVER GREATER THAN 100.5 F  *CHILLS WITH OR WITHOUT FEVER  NAUSEA AND VOMITING THAT IS NOT CONTROLLED WITH YOUR NAUSEA MEDICATION  *UNUSUAL SHORTNESS OF BREATH  *UNUSUAL BRUISING OR BLEEDING  TENDERNESS IN MOUTH AND THROAT WITH OR WITHOUT PRESENCE OF ULCERS  *URINARY PROBLEMS  *BOWEL PROBLEMS  UNUSUAL RASH Items with * indicate a potential emergency and should be followed up as soon as possible.  Feel free to call the clinic should you have any questions or concerns. The clinic phone number is [phone].  Please show the [place] at check-in to the Emergency Department and triage nurse.

## 2020-06-02 ENCOUNTER — Encounter: Payer: Self-pay | Admitting: Hematology

## 2020-06-03 ENCOUNTER — Inpatient Hospital Stay: Payer: Medicare Other | Attending: Hematology

## 2020-06-03 ENCOUNTER — Other Ambulatory Visit: Payer: Self-pay

## 2020-06-03 VITALS — BP 130/54 | HR 70 | Resp 18

## 2020-06-03 DIAGNOSIS — C182 Malignant neoplasm of ascending colon: Secondary | ICD-10-CM | POA: Diagnosis not present

## 2020-06-03 DIAGNOSIS — Z452 Encounter for adjustment and management of vascular access device: Secondary | ICD-10-CM | POA: Diagnosis not present

## 2020-06-03 DIAGNOSIS — Z170 Estrogen receptor positive status [ER+]: Secondary | ICD-10-CM | POA: Diagnosis not present

## 2020-06-03 DIAGNOSIS — C50919 Malignant neoplasm of unspecified site of unspecified female breast: Secondary | ICD-10-CM | POA: Diagnosis not present

## 2020-06-03 MED ORDER — HEPARIN SOD (PORK) LOCK FLUSH 100 UNIT/ML IV SOLN
500.0000 [IU] | Freq: Once | INTRAVENOUS | Status: AC | PRN
  Administered 2020-06-03: 500 [IU]
  Filled 2020-06-03: qty 5

## 2020-06-03 MED ORDER — SODIUM CHLORIDE 0.9% FLUSH
10.0000 mL | INTRAVENOUS | Status: DC | PRN
  Administered 2020-06-03: 10 mL
  Filled 2020-06-03: qty 10

## 2020-06-03 NOTE — Patient Instructions (Signed)

## 2020-07-05 ENCOUNTER — Other Ambulatory Visit: Payer: Self-pay | Admitting: Hematology

## 2020-07-13 ENCOUNTER — Other Ambulatory Visit: Payer: Self-pay

## 2020-07-13 ENCOUNTER — Inpatient Hospital Stay: Payer: Medicare Other | Attending: Hematology

## 2020-07-13 DIAGNOSIS — C182 Malignant neoplasm of ascending colon: Secondary | ICD-10-CM

## 2020-07-13 DIAGNOSIS — Z452 Encounter for adjustment and management of vascular access device: Secondary | ICD-10-CM | POA: Diagnosis not present

## 2020-07-13 DIAGNOSIS — Z95828 Presence of other vascular implants and grafts: Secondary | ICD-10-CM

## 2020-07-13 DIAGNOSIS — Z85038 Personal history of other malignant neoplasm of large intestine: Secondary | ICD-10-CM | POA: Insufficient documentation

## 2020-07-13 MED ORDER — HEPARIN SOD (PORK) LOCK FLUSH 100 UNIT/ML IV SOLN
500.0000 [IU] | Freq: Once | INTRAVENOUS | Status: AC | PRN
  Administered 2020-07-13: 500 [IU]
  Filled 2020-07-13: qty 5

## 2020-07-13 MED ORDER — SODIUM CHLORIDE 0.9% FLUSH
10.0000 mL | INTRAVENOUS | Status: DC | PRN
  Administered 2020-07-13: 10 mL
  Filled 2020-07-13: qty 10

## 2020-07-13 NOTE — Patient Instructions (Signed)
Implanted Port Insertion, Care After This sheet gives you information about how to care for yourself after your procedure. Your health care provider may also give you more specific instructions. If you have problems or questions, contact your health care provider. What can I expect after the procedure? After the procedure, it is common to have:  Discomfort at the port insertion site.  Bruising on the skin over the port. This should improve over 3-4 days. Follow these instructions at home: Port care  After your port is placed, you will get a manufacturer's information card. The card has information about your port. Keep this card with you at all times.  Take care of the port as told by your health care provider. Ask your health care provider if you or a family member can get training for taking care of the port at home. A home health care nurse may also take care of the port.  Make sure to remember what type of port you have. Incision care  Follow instructions from your health care provider about how to take care of your port insertion site. Make sure you: ? Wash your hands with soap and water before and after you change your bandage (dressing). If soap and water are not available, use hand sanitizer. ? Change your dressing as told by your health care provider. ? Leave stitches (sutures), skin glue, or adhesive strips in place. These skin closures may need to stay in place for 2 weeks or longer. If adhesive strip edges start to loosen and curl up, you may trim the loose edges. Do not remove adhesive strips completely unless your health care provider tells you to do that.  Check your port insertion site every day for signs of infection. Check for: ? Redness, swelling, or pain. ? Fluid or blood. ? Warmth. ? Pus or a bad smell.      Activity  Return to your normal activities as told by your health care provider. Ask your health care provider what activities are safe for you.  Do not  lift anything that is heavier than 10 lb (4.5 kg), or the limit that you are told, until your health care provider says that it is safe. General instructions  Take over-the-counter and prescription medicines only as told by your health care provider.  Do not take baths, swim, or use a hot tub until your health care provider approves. Ask your health care provider if you may take showers. You may only be allowed to take sponge baths.  Do not drive for 24 hours if you were given a sedative during your procedure.  Wear a medical alert bracelet in case of an emergency. This will tell any health care providers that you have a port.  Keep all follow-up visits as told by your health care provider. This is important. Contact a health care provider if:  You cannot flush your port with saline as directed, or you cannot draw blood from the port.  You have a fever or chills.  You have redness, swelling, or pain around your port insertion site.  You have fluid or blood coming from your port insertion site.  Your port insertion site feels warm to the touch.  You have pus or a bad smell coming from the port insertion site. Get help right away if:  You have chest pain or shortness of breath.  You have bleeding from your port that you cannot control. Summary  Take care of the port as told by your   health care provider. Keep the manufacturer's information card with you at all times.  Change your dressing as told by your health care provider.  Contact a health care provider if you have a fever or chills or if you have redness, swelling, or pain around your port insertion site.  Keep all follow-up visits as told by your health care provider. This information is not intended to replace advice given to you by your health care provider. Make sure you discuss any questions you have with your health care provider. Document Revised: 11/14/2017 Document Reviewed: 11/14/2017 Elsevier Patient Education   2021 [employer]  

## 2020-08-08 ENCOUNTER — Other Ambulatory Visit: Payer: Self-pay | Admitting: Hematology

## 2020-08-11 ENCOUNTER — Other Ambulatory Visit: Payer: Self-pay | Admitting: Hematology and Oncology

## 2020-08-11 DIAGNOSIS — Z1231 Encounter for screening mammogram for malignant neoplasm of breast: Secondary | ICD-10-CM

## 2020-08-26 ENCOUNTER — Inpatient Hospital Stay: Payer: Medicare Other

## 2020-08-26 ENCOUNTER — Ambulatory Visit (HOSPITAL_COMMUNITY)
Admission: RE | Admit: 2020-08-26 | Discharge: 2020-08-26 | Disposition: A | Discharge status: Home / Self Care | Payer: Medicare Other | Source: Ambulatory Visit | Attending: Hematology | Admitting: Hematology

## 2020-08-26 ENCOUNTER — Other Ambulatory Visit: Payer: Self-pay

## 2020-08-26 ENCOUNTER — Encounter (HOSPITAL_COMMUNITY): Payer: Self-pay

## 2020-08-26 ENCOUNTER — Inpatient Hospital Stay: Payer: Medicare Other | Attending: Hematology

## 2020-08-26 DIAGNOSIS — C182 Malignant neoplasm of ascending colon: Secondary | ICD-10-CM | POA: Insufficient documentation

## 2020-08-26 DIAGNOSIS — C189 Malignant neoplasm of colon, unspecified: Secondary | ICD-10-CM | POA: Diagnosis not present

## 2020-08-26 DIAGNOSIS — N281 Cyst of kidney, acquired: Secondary | ICD-10-CM | POA: Diagnosis not present

## 2020-08-26 DIAGNOSIS — K5790 Diverticulosis of intestine, part unspecified, without perforation or abscess without bleeding: Secondary | ICD-10-CM | POA: Diagnosis not present

## 2020-08-26 DIAGNOSIS — M5136 Other intervertebral disc degeneration, lumbar region: Secondary | ICD-10-CM | POA: Diagnosis not present

## 2020-08-26 DIAGNOSIS — Z95828 Presence of other vascular implants and grafts: Secondary | ICD-10-CM

## 2020-08-26 LAB — CMP (CANCER CENTER ONLY)
ALT: 18 U/L (ref 0–44)
AST: 28 U/L (ref 15–41)
Albumin: 3.9 g/dL (ref 3.5–5.0)
Alkaline Phosphatase: 100 U/L (ref 38–126)
Anion gap: 11 (ref 5–15)
BUN: 21 mg/dL (ref 8–23)
CO2: 26 mmol/L (ref 22–32)
Calcium: 9.3 mg/dL (ref 8.9–10.3)
Chloride: 106 mmol/L (ref 98–111)
Creatinine: 0.8 mg/dL (ref 0.44–1.00)
GFR, Estimated: >60 mL/min (ref >60)
Glucose, Bld: 128 mg/dL — ABNORMAL HIGH (ref 70–99)
Potassium: 4.3 mmol/L (ref 3.5–5.1)
Sodium: 143 mmol/L (ref 135–145)
Total Bilirubin: 1 mg/dL (ref 0.3–1.2)
Total Protein: 7.3 g/dL (ref 6.5–8.1)

## 2020-08-26 LAB — CBC WITH DIFFERENTIAL (CANCER CENTER ONLY)
Abs Immature Granulocytes: 0.02 10*3/uL (ref 0.00–0.07)
Basophils Absolute: 0.1 10*3/uL (ref 0.0–0.1)
Basophils Relative: 1 %
Eosinophils Absolute: 0.2 10*3/uL (ref 0.0–0.5)
Eosinophils Relative: 3 %
HCT: 37.2 % (ref 36.0–46.0)
Hemoglobin: 12 g/dL (ref 12.0–15.0)
Immature Granulocytes: 0 %
Lymphocytes Relative: 39 %
Lymphs Abs: 2.6 10*3/uL (ref 0.7–4.0)
MCH: 33 pg (ref 26.0–34.0)
MCHC: 32.3 g/dL (ref 30.0–36.0)
MCV: 102.2 fL — ABNORMAL HIGH (ref 80.0–100.0)
Monocytes Absolute: 0.6 10*3/uL (ref 0.1–1.0)
Monocytes Relative: 9 %
Neutro Abs: 3.2 10*3/uL (ref 1.7–7.7)
Neutrophils Relative %: 48 %
Platelet Count: 245 10*3/uL (ref 150–400)
RBC: 3.64 MIL/uL — ABNORMAL LOW (ref 3.87–5.11)
RDW: 12.2 % (ref 11.5–15.5)
WBC Count: 6.7 10*3/uL (ref 4.0–10.5)
nRBC: 0 % (ref 0.0–0.2)

## 2020-08-26 LAB — CEA (IN HOUSE-CHCC): CEA (CHCC-In House): 2.45 ng/mL (ref 0.00–5.00)

## 2020-08-26 MED ORDER — HEPARIN SOD (PORK) LOCK FLUSH 100 UNIT/ML IV SOLN
INTRAVENOUS | Status: AC
  Administered 2020-08-26: 500 [IU]
  Filled 2020-08-26: qty 5

## 2020-08-26 MED ORDER — SODIUM CHLORIDE 0.9% FLUSH
10.0000 mL | INTRAVENOUS | Status: DC | PRN
  Administered 2020-08-26: 10 mL
  Filled 2020-08-26: qty 10

## 2020-08-26 MED ORDER — IOHEXOL 300 MG/ML  SOLN
100.0000 mL | Freq: Once | INTRAMUSCULAR | Status: AC | PRN
  Administered 2020-08-26: 100 mL via INTRAVENOUS

## 2020-08-26 MED ORDER — HEPARIN SOD (PORK) LOCK FLUSH 100 UNIT/ML IV SOLN
500.0000 [IU] | Freq: Once | INTRAVENOUS | Status: DC | PRN
  Filled 2020-08-26: qty 5

## 2020-08-26 MED ORDER — HEPARIN SOD (PORK) LOCK FLUSH 100 UNIT/ML IV SOLN: 500.0000 [IU] | Freq: Once | INTRAVENOUS | Status: DC

## 2020-08-26 NOTE — Progress Notes (Signed)
[place]   [phone] [phone]   Clinic Follow up Note   Patient Care Team: Hulan Fess, MD as PCP - General (Family Medicine) Excell Seltzer, MD (Inactive) as Consulting Physician (General Surgery) Rolm Bookbinder, MD as Consulting Physician (General Surgery) Thea Silversmith, MD as Consulting Physician (Radiation Oncology) Holley Bouche, NP (Inactive) as Nurse Practitioner (Nurse Practitioner) Truitt Merle, MD as Consulting Physician (Hematology) Jonnie Finner, RN as Oncology Nurse Navigator Leighton Ruff, MD as Consulting Physician (General Surgery) Nicholas Lose, MD as Consulting Physician (Hematology and Oncology) Leighton Ruff, MD as Consulting Physician (General Surgery)  Date of Service:  08/31/2020  CHIEF COMPLAINT: F/u of colon cancer, B/l PE  SUMMARY OF ONCOLOGIC HISTORY: Oncology History Overview Note  Cancer Staging Breast cancer of upper-outer quadrant of left female breast [facility]) Staging form: Breast, AJCC 7th Edition - Clinical stage from 06/11/2014: Stage 0 (Tis (DCIS), N0, M0) - Unsigned - Pathologic stage from 07/03/2014: Stage Unknown (Tis (DCIS), NX, cM0) - Signed by Enid Cutter, MD on 07/10/2014 Staging comments: Staged on final lumpectomy specimen by Dr. Donato Heinz.  right colon cancer Staging form: Colon and Rectum, AJCC 8th Edition - Pathologic stage from 11/01/2019: Stage IIIB (pT3, pN2a, cM0) - Signed by Truitt Merle, MD on 11/29/2019    Malignant neoplasm of female breast ([place])  05/29/2014 Initial Biopsy   Left breast needle core biopsy: Grade 2, DCIS with calcs. ER+ (100%), PR+ (96%).    06/04/2014 Initial Diagnosis   Left breast DCIS with calcifications, ER 100%, PR 96%   06/10/2014 Breast MRI   Left breast: 2.4 x 1.3 x 1.1 cm area of patchy non-mass enhancement upper outer quadrant includes postbiopsy seroma; Right breast: 1.2 cm previously biopsied stable benign fibroadenoma   06/12/2014 Procedure    Genetic counseling/testing: Identified 1 VUS on CHEK2 gene. Remainder of 17 gene panel tested negative and included: ATM, BARD1, BRCA 1/2, BRIP1, CDH1, CHEK2, EPCAM, MLH1, MSH2, MSH6, NBN, NF1, PALB2, PTEN, RAD50, RAD51C, RAD51D, STK11, and TP53.    07/01/2014 Surgery   Left breast lumpectomy ([place]): Grade 1, DCIS, spanning 2.3 cm, 1 mm margin, ER 100%, PR 96%   07/31/2014 - 08/28/2014 Radiation Therapy   Adjuvant RT completed Pablo Ledger). Left breast: Total dose 42.5 Gy over 17 fractions. Left breast boost: Total dose 7.5 Gy over 3 fractions.    09/14/2014 - 01/13/2020 Anti-estrogen oral therapy   Anastrazole $RemoveBefo'1mg'pWtBsbiedFO$  daily. Planned duration of treatment: 5 years [place]). Completed in 01/2020.    09/25/2014 Survivorship   Survivorship Care Plan given to patient and reviewed with her in person.    right colon cancer  09/19/2019 Imaging   CT AP W contrast 09/19/19  IMPRESSION Fullness in the cecum, cannot exclude a mass. No evidence for metastatic disease is identified.    09/24/2019 Procedure   Colonoscopy by Dr Eber Jones 09/24/19 IMPRESSION 1. The colon was redundant  2. Mild diverticulosis was noted through the entire examined colon 3. Single 27mm polyp was found in the ascending colon; polypectomy was performed using snare cautery and biopsy forceps 4. Mild diverticulosis was notes in the descending colon and sigmoid colon.  5. Single polyp was found in the sigmoid colon, polypectomy was performed with cold forceps.  6. Single polyp was found in the rectosigmoid colon; polypectomy was performed with cold snare  7. Small internal hemorrhoids  8. Large mass was found at the cecum; multiple biopsies of the area were performed using cold forceps; injection (tattooing) was performed distal to  the mass.    09/24/2019 Initial Biopsy   INTERPRETATION AND DIAGNOSIS:  A. Cecum, biopsy:  Invasive moderately differentiated adenocarcinoma.  see comment  B. Polyp @ ascending colon, polypectomy:  Tubular  Adenoma  C. Polyp @ sigmoid colon Polypectomy:  hyperplastic polyp.  D. Polyp @ rectosigmoid colon, Polypectomy:  Hyperplastic Polyp      10/16/2019 Imaging   CT Chest IMPRESSION: 1. Multiple small pulmonary nodules measuring 5 mm or less in size in the lungs. These are nonspecific and are typically considered statistically likely benign. However, given the patient's history of primary malignancy, close attention on follow-up studies is recommended to ensure stability. 2. Aortic atherosclerosis, in addition to right coronary artery disease. Assessment for potential risk factor modification, dietary therapy or pharmacologic therapy may be warranted, if clinically indicated. 3. There are calcifications of the aortic valve and mitral annulus. Echocardiographic correlation for evaluation of potential valvular dysfunction may be warranted if clinically indicated. 4. Small hiatal hernia.   Aortic Atherosclerosis (ICD10-I70.0).   11/01/2019 Initial Diagnosis   Colon cancer (HCC)   11/01/2019 Surgery   LAPAROSCOPIC PARTIAL COLECTOMY by Dr Maisie Fus and Dr Michaell Cowing   11/01/2019 Pathology Results   FINAL MICROSCOPIC DIAGNOSIS:   A. COLON, PROXIMAL RIGHT, COLECTOMY:  - Invasive colonic adenocarcinoma, 5 cm.  - Tumor invades through the muscularis propria into pericolonic tissues.   - Margins of resection are not involved.  - Metastatic carcinoma in (5) of (13) lymph nodes.  - See oncology table.    MSI Stable  Mismatch repair normal  MLH1 - Preserved nuclear expression (greater 50% tumor expression) MSH2 - Preserved nuclear expression (greater 50% tumor expression) MSH6 - Preserved nuclear expression (greater 50% tumor expression) PMS2 - Preserved nuclear expression (greater 50% tumor expression)   11/01/2019 Cancer Staging   Staging form: Colon and Rectum, AJCC 8th Edition - Pathologic stage from 11/01/2019: Stage IIIB (pT3, pN2a, cM0) - Signed by [name], MD on 11/29/2019   12/10/2019  Procedure   PAC placed 12/10/19   12/17/2019 - 06/01/2020 Chemotherapy   FOLFOX q2weeks starting in 2 weeks starting 12/17/19. Held 01/27/20-02/10/20 due to b/l PE. Oxaliplatin held C11-12 due to neuropathy. Completed on 06/01/20      CURRENT THERAPY:  Surveillance   INTERVAL HISTORY:  Latrish Mogel is here for a follow up of colon cancer. She was last seen by me 4 months ago. She presents to the clinic alone. She notes she is doing well. She notes she still has neuropathy in her fingers with mild tingling and numbness. She notes mild difficulty picking up small items. Her baseline in her toes did progress after chemo but manageable. She notes she takes multivitamin. I reviewed her medication list with her.     REVIEW OF SYSTEMS:   Constitutional: Denies fevers, chills or abnormal weight loss Eyes: Denies blurriness of vision Ears, nose, mouth, throat, and face: Denies mucositis or sore throat Respiratory: Denies cough, dyspnea or wheezes Cardiovascular: Denies palpitation, chest discomfort or lower extremity swelling Gastrointestinal:  Denies nausea, heartburn or change in bowel habits Skin: Denies abnormal skin rashes Lymphatics: Denies new lymphadenopathy or easy bruising Neurological (+) Cold sensitivity and neuropathy in hands and feet.  Behavioral/Psych: Mood is stable, no new changes  All other systems were reviewed with the patient and are negative.  MEDICAL HISTORY:  Past Medical History:  Diagnosis Date  . Aortic atherosclerosis (HCC)   . Arthritis    feet, lower back  . Basal cell carcinoma  arm  . Breast cancer of upper-outer quadrant of left female breast ([place]) 06/04/2014  . Cataract    immature on the left  . Colon cancer ([place]) 08/2019  . Diabetes mellitus without complication ([place])   . Diverticulosis   . Dizziness    > 33yrs ago;took Antivert   . Family history of anesthesia complication    sister slow to wake up with anesthesia  . Family history of breast  cancer   . Family history of colon cancer   . Family history of uterine cancer   . GERD (gastroesophageal reflux disease)    takes occasional TUMs  . History of bronchitis    > 53yrs ago  . History of colon polyps   . History of hiatal hernia    Small noted on CT  . Hypertension    takes Losartan daily and HCTZ  . Iron deficiency anemia   . Joint pain   . Numbness    to toes on each foot  . Peripheral neuropathy    feet and toes  . Personal history of radiation therapy   . Pulmonary nodules    Noted on CT  . Radiation 07/31/14-08/28/14   Left Breast 20 fxs  . Seasonal allergies    takes Claritin prn  . Urinary frequency   . Vitamin D deficiency    takes VIt D daily    SURGICAL HISTORY: Past Surgical History:  Procedure Laterality Date  . BREAST BIOPSY Bilateral   . BREAST LUMPECTOMY Left   . BREAST LUMPECTOMY WITH RADIOACTIVE SEED LOCALIZATION Left 07/01/2014   Procedure: LEFT BREAST LUMPECTOMY WITH RADIOACTIVE SEED LOCALIZATION;  Surgeon: Excell Seltzer, MD;  Location: [place];  Service: General;  Laterality: Left;  . CATARACT EXTRACTION Right   . COLONOSCOPY    . LAPAROSCOPIC PARTIAL COLECTOMY N/A 11/01/2019   Procedure: LAPAROSCOPIC PARTIAL COLECTOMY;  Surgeon: Leighton Ruff, MD;  Location: [facility];  Service: General;  Laterality: N/A;  . PORTACATH PLACEMENT N/A 12/10/2019   Procedure: INSERTION PORT-A-CATH ULTRASOUND GUIDED IN RIGHT IJ;  Surgeon: Leighton Ruff, MD;  Location: [facility];  Service: General;  Laterality: N/A;  . TOTAL KNEE ARTHROPLASTY Left 10/24/2012   Procedure: TOTAL KNEE ARTHROPLASTY;  Surgeon: Kerin Salen, MD;  Location: [place];  Service: Orthopedics;  Laterality: Left;  . TOTAL KNEE ARTHROPLASTY Right 01/09/2013   Procedure: TOTAL KNEE ARTHROPLASTY;  Surgeon: Kerin Salen, MD;  Location: [place];  Service: Orthopedics;  Laterality: Right;  . TUBAL LIGATION      I have reviewed the social history and family history with the patient  and they are unchanged from previous note.  ALLERGIES:  is allergic to oxycodone.  MEDICATIONS:  Current Outpatient Medications  Medication Sig Dispense Refill  . apixaban (ELIQUIS) 5 MG TABS tablet Take 1 tablet (5 mg total) by mouth 2 (two) times daily. 60 tablet 3  . Cholecalciferol (VITAMIN D) 2000 UNITS CAPS Take 2,000 Units by mouth daily.     Marland Kitchen ibuprofen (ADVIL) 200 MG tablet Take 400 mg by mouth every 6 (six) hours as needed for moderate pain.     Marland Kitchen lidocaine-prilocaine (EMLA) cream Apply to affected area once (Patient taking differently: Apply 1 application topically daily as needed (port access).) 30 g 3  . loratadine (CLARITIN) 10 MG tablet Take 10 mg by mouth daily as needed for allergies.    Marland Kitchen losartan (COZAAR) 25 MG tablet Take 25 mg by mouth daily.    . metFORMIN (GLUCOPHAGE) 500  MG tablet Take 1 tablet (500 mg total) by mouth daily with breakfast. (Patient taking differently: Take 500 mg by mouth in the morning and at bedtime.)    . Multiple Vitamins-Minerals (MULTIVITAMIN WITH MINERALS) tablet Take 1 tablet by mouth daily.    Marland Kitchen omeprazole (PRILOSEC) 20 MG capsule Take 20 mg by mouth daily before breakfast.     . pravastatin (PRAVACHOL) 10 MG tablet Take 10 mg by mouth daily.     No current facility-administered medications for this visit.    PHYSICAL EXAMINATION: ECOG PERFORMANCE STATUS: 1 - Symptomatic but completely ambulatory  Vitals:   08/31/20 0856  BP: (!) 151/78  Pulse: 86  Resp: 18  Temp: (!) 97.3 F (36.3 C)  SpO2: 96%   Filed Weights   08/31/20 0856  Weight: 237 lb (107.5 kg)    GENERAL:alert, no distress and comfortable SKIN: skin color, texture, turgor are normal, no rashes or significant lesions EYES: normal, Conjunctiva are pink and non-injected, sclera clear  NECK: supple, thyroid normal size, non-tender, without nodularity LYMPH:  no palpable lymphadenopathy in the cervical, axillary  LUNGS: clear to auscultation and percussion with normal  breathing effort HEART: regular rate & rhythm and no murmurs and no lower extremity edema ABDOMEN:abdomen soft, non-tender and normal bowel sounds (+) Surgical incision healed well  Musculoskeletal:no cyanosis of digits and no clubbing  NEURO: alert & oriented x 3 with fluent speech, no focal motor/sensory deficits BREAST: s/p left lumpectomy: surgical incision healed well with scar tissue in lateral left breast. No palpable mass, nodules or adenopathy bilaterally. Breast exam benign.   LABORATORY DATA:  I have reviewed the data as listed CBC Latest Ref Rng & Units 08/26/2020 06/01/2020 05/18/2020  WBC 4.0 - 10.5 K/uL 6.7 3.6(L) 9.3  Hemoglobin 12.0 - 15.0 g/dL 12.0 10.1(L) 10.8(L)  Hematocrit 36.0 - 46.0 % 37.2 32.0(L) 34.0(L)  Platelets 150 - 400 K/uL 245 160 168     CMP Latest Ref Rng & Units 08/26/2020 06/01/2020 05/18/2020  Glucose 70 - 99 mg/dL 128(H) 180(H) 117(H)  BUN 8 - 23 mg/dL $Remove'21 13 9  'wzDXYqY$ Creatinine 0.44 - 1.00 mg/dL 0.80 0.79 0.81  Sodium 135 - 145 mmol/L 143 142 141  Potassium 3.5 - 5.1 mmol/L 4.3 4.1 4.5  Chloride 98 - 111 mmol/L 106 110 109  CO2 22 - 32 mmol/L $RemoveB'26 24 23  'zgNlQiCT$ Calcium 8.9 - 10.3 mg/dL 9.3 9.4 9.6  Total Protein 6.5 - 8.1 g/dL 7.3 7.2 7.7  Total Bilirubin 0.3 - 1.2 mg/dL 1.0 1.2 1.0  Alkaline Phos 38 - 126 U/L 100 143(H) 184(H)  AST 15 - 41 U/L 28 61(H) 51(H)  ALT 0 - 44 U/L 18 45(H) 38      RADIOGRAPHIC STUDIES: I have personally reviewed the radiological images as listed and agreed with the findings in the report. No results found.   ASSESSMENT & PLAN:  Kim Castro is a 75 y.o. female with   1.Right colon cancer, pT3N2aM0 stage IIB, MSS -She was diagnosed in 08/2019 withcecal mass biopsy showed moderately differentiatedadenocarcinoma.CT chest from 10/16/19 did shows multiple small lung nodules that were nonspecific but likely benign. Will monitor.  -She underwent colon surgery with Dr Marcello Moores on 11/01/19.Path showed overall Stage IIIB  cancer. -She was treated with adjuvant chemotherapy FOLFOXfor 6 months to reduce her risk of recurrencewith. She is currently on surveillance.  -Her CT CAP from 08/26/20 showed Status post right hemicolectomy. No evidence for metastatic disease in the chest, abdomen, or pelvis. I  personally reviewed scan images and discussed the findings with patient today.  -CT CAP also showed endometrial thickening. She denies recent vaginal bleeding. I recommend pelvis US and she is agreeable.  -She is clinically dong well. Labs reviewed, CBC and CMP WNL except BG 128. CEA normal. Physical exam benign.  -She is 1 year since her cancer diagnosis. Continue surveillance. Next scan in 6 months. She is due for surveillance colonoscopy.  -She will keep her PAC for at least a year, will continue port flushes every 6-8 weeks. She is agreeable.  -F/u in 3 months.   2. Neuropathy, secondary to Oxaliplatin G2 -S/p C7 she started having numbness in hands, L>R which become prolonged with tingling s/p C10.  -Oxaliplatin dose reduced and ultimately held with C11-12.  -Continue Gabapentin 100mg , can increase to TID. So far manageable.   3.Iron deficient anemia -Off chemo her anemia has resolved. She can stop oral iron.   4.H/o left breast DCIS, G2, ER/PR+ -Dx in 05/2014. Treated with left lumpectomy with Dr Excell Seltzer, adjuvant RT with Dr Pablo Ledger. She has been on Anastrozole 08/2014-01/13/20.She waspreviouslyunder the care ofDr Gudena.  -Bone density 06/09/2015 T score +1.1 normal. She is due for DEXA, will order to be done with 10/02/20 mammogram. She is agreeable.  -Breast exam normal today (08/31/20). Proceed with mammogram on 10/02/20.   5.Submassive bilateral pulmonary embolism, LE Edema -S/p C3 chemo, her 9/27/21CTA showed PE with right heart strain.She also has bilateral lower extremity edemaand Doppler ultrasound was negative for DVT bilaterally. -She is s/p 6 months of Eliquis. 08/26/20 CT CAP negative  for blood clot. She is fine to stop Eliquis (08/31/20).  -She has been on Lasix and oral potassiumBIDfor LE Edema. If her LE improves or resolves she can reduce or stop lasix and potassium, then use as needed.  5. Comorbidities: Arthritis, DM, HTN, GERD -f/u with PCP Dr. Rex Kras   PLAN: -Stop oral iron and Eliquis  -Pelvic US in 2-3 weeks. Will call her with the result  -Mammogram and DEXA on 10/02/20 -Flush in 6 weeks   -Lab, flush, F/u in 3 months    No problem-specific Assessment & Plan notes found for this encounter.   Orders Placed This Encounter  Procedures  . US Pelvis Complete    Standing Status:   Future    Standing Expiration Date:   08/31/2021    Order Specific Question:   Reason for Exam (SYMPTOM  OR DIAGNOSIS REQUIRED)    Answer:   abnormal endometrum on recent CT, no vaginal bleeidng or discharge    Order Specific Question:   Preferred imaging location?    Answer:   [facility]    Order Specific Question:   Release to patient    Answer:   Immediate  . DG Bone Density    Ins: bcbs-not local  Epic order  Pf: 5-6 years ago, pt unsure where  Wt 237 -pt aware to stop multi 48 hrs prior, no calc / no needs  dp w pt    Standing Status:   Future    Standing Expiration Date:   08/31/2021    Order Specific Question:   Reason for Exam (SYMPTOM  OR DIAGNOSIS REQUIRED)    Answer:   estrogen deficiency     Order Specific Question:   Preferred imaging location?    Answer:   [facility]   All questions were answered. The patient knows to call the clinic with any problems, questions or concerns. No barriers to learning  was detected. The total time spent in the appointment was 35 minutes.     Truitt Merle, MD 08/31/2020   I, Joslyn Devon, am acting as scribe for Truitt Merle, MD.   I have reviewed the above documentation for accuracy and completeness, and I agree with the above.

## 2020-08-31 ENCOUNTER — Inpatient Hospital Stay: Payer: Medicare Other | Attending: Hematology | Admitting: Hematology

## 2020-08-31 ENCOUNTER — Telehealth: Payer: Self-pay | Admitting: Hematology

## 2020-08-31 ENCOUNTER — Other Ambulatory Visit: Payer: Self-pay

## 2020-08-31 ENCOUNTER — Encounter: Payer: Self-pay | Admitting: Hematology

## 2020-08-31 VITALS — BP 151/78 | HR 86 | Temp 97.3°F | Resp 18 | Wt 237.0 lb

## 2020-08-31 DIAGNOSIS — E2839 Other primary ovarian failure: Secondary | ICD-10-CM | POA: Diagnosis not present

## 2020-08-31 DIAGNOSIS — Z7901 Long term (current) use of anticoagulants: Secondary | ICD-10-CM | POA: Diagnosis not present

## 2020-08-31 DIAGNOSIS — I700 Atherosclerosis of aorta: Secondary | ICD-10-CM | POA: Insufficient documentation

## 2020-08-31 DIAGNOSIS — I10 Essential (primary) hypertension: Secondary | ICD-10-CM | POA: Diagnosis not present

## 2020-08-31 DIAGNOSIS — K219 Gastro-esophageal reflux disease without esophagitis: Secondary | ICD-10-CM | POA: Insufficient documentation

## 2020-08-31 DIAGNOSIS — Z923 Personal history of irradiation: Secondary | ICD-10-CM | POA: Insufficient documentation

## 2020-08-31 DIAGNOSIS — C182 Malignant neoplasm of ascending colon: Secondary | ICD-10-CM | POA: Insufficient documentation

## 2020-08-31 DIAGNOSIS — E559 Vitamin D deficiency, unspecified: Secondary | ICD-10-CM | POA: Insufficient documentation

## 2020-08-31 DIAGNOSIS — Z853 Personal history of malignant neoplasm of breast: Secondary | ICD-10-CM | POA: Diagnosis not present

## 2020-08-31 DIAGNOSIS — Z9221 Personal history of antineoplastic chemotherapy: Secondary | ICD-10-CM | POA: Diagnosis not present

## 2020-08-31 DIAGNOSIS — E119 Type 2 diabetes mellitus without complications: Secondary | ICD-10-CM | POA: Diagnosis not present

## 2020-08-31 DIAGNOSIS — R9389 Abnormal findings on diagnostic imaging of other specified body structures: Secondary | ICD-10-CM | POA: Insufficient documentation

## 2020-08-31 DIAGNOSIS — Z79899 Other long term (current) drug therapy: Secondary | ICD-10-CM | POA: Diagnosis not present

## 2020-08-31 DIAGNOSIS — Z7984 Long term (current) use of oral hypoglycemic drugs: Secondary | ICD-10-CM | POA: Diagnosis not present

## 2020-08-31 NOTE — Telephone Encounter (Signed)
Scheduled follow-up appointments per 5/2 los. Patient is aware. ?

## 2020-09-11 ENCOUNTER — Ambulatory Visit
Admission: RE | Admit: 2020-09-11 | Discharge: 2020-09-11 | Disposition: A | Discharge status: Home / Self Care | Payer: Medicare Other | Source: Ambulatory Visit | Attending: Hematology | Admitting: Hematology

## 2020-09-11 ENCOUNTER — Other Ambulatory Visit: Payer: Self-pay

## 2020-09-11 ENCOUNTER — Ambulatory Visit (HOSPITAL_COMMUNITY)
Admission: RE | Admit: 2020-09-11 | Discharge: 2020-09-11 | Disposition: A | Discharge status: Home / Self Care | Payer: Medicare Other | Source: Ambulatory Visit | Attending: Hematology | Admitting: Hematology

## 2020-09-11 DIAGNOSIS — Z780 Asymptomatic menopausal state: Secondary | ICD-10-CM | POA: Diagnosis not present

## 2020-09-11 DIAGNOSIS — E2839 Other primary ovarian failure: Secondary | ICD-10-CM

## 2020-09-11 DIAGNOSIS — R9389 Abnormal findings on diagnostic imaging of other specified body structures: Secondary | ICD-10-CM | POA: Diagnosis not present

## 2020-09-11 DIAGNOSIS — N8500 Endometrial hyperplasia, unspecified: Secondary | ICD-10-CM | POA: Diagnosis not present

## 2020-09-11 DIAGNOSIS — C182 Malignant neoplasm of ascending colon: Secondary | ICD-10-CM | POA: Insufficient documentation

## 2020-09-14 ENCOUNTER — Telehealth: Payer: Self-pay | Admitting: Hematology

## 2020-09-14 NOTE — Telephone Encounter (Signed)
I called patient and reviewed her recent pelvic ultrasound findings with her.  It showed endometrial stripe measuring up to 11 mm in thickness, endometrial biopsy is recommended.  Patient does not have Tylenol bleeding or abdominal discomfort.  Patient does not have a gynecologist, I will refer her.  She is agreeable.  Truitt Merle  09/14/2020

## 2020-09-16 ENCOUNTER — Telehealth: Payer: Self-pay

## 2020-09-16 NOTE — Telephone Encounter (Signed)
Called left message concerning most recent  DEXA results encouraged to call for any questions concerns or change s

## 2020-09-16 NOTE — Telephone Encounter (Signed)
-----   Message from Truitt Merle, MD sent at 09/15/2020  4:01 PM EDT ----- Please let pt know her DEXA was normal, thanks   Truitt Merle  09/15/2020

## 2020-09-18 ENCOUNTER — Telehealth: Payer: Self-pay | Admitting: *Deleted

## 2020-09-18 NOTE — Telephone Encounter (Signed)
Spoke with the patient and scheduled a new patient appt for 5/24 with Dr Berline Lopes. Patient aware of clinic address and phone number. Referring MD requested Dr Berline Lopes

## 2020-09-21 ENCOUNTER — Encounter: Payer: Self-pay | Admitting: Gynecologic Oncology

## 2020-09-21 ENCOUNTER — Encounter: Payer: Self-pay | Admitting: Hematology

## 2020-09-22 ENCOUNTER — Encounter: Payer: Self-pay | Admitting: Gynecologic Oncology

## 2020-09-22 ENCOUNTER — Inpatient Hospital Stay: Payer: Medicare Other | Admitting: Gynecologic Oncology

## 2020-09-22 ENCOUNTER — Other Ambulatory Visit: Payer: Self-pay

## 2020-09-22 DIAGNOSIS — C182 Malignant neoplasm of ascending colon: Secondary | ICD-10-CM | POA: Diagnosis not present

## 2020-09-22 DIAGNOSIS — E559 Vitamin D deficiency, unspecified: Secondary | ICD-10-CM | POA: Diagnosis not present

## 2020-09-22 DIAGNOSIS — R9389 Abnormal findings on diagnostic imaging of other specified body structures: Secondary | ICD-10-CM

## 2020-09-22 DIAGNOSIS — K219 Gastro-esophageal reflux disease without esophagitis: Secondary | ICD-10-CM | POA: Diagnosis not present

## 2020-09-22 DIAGNOSIS — I10 Essential (primary) hypertension: Secondary | ICD-10-CM | POA: Diagnosis not present

## 2020-09-22 DIAGNOSIS — Z7901 Long term (current) use of anticoagulants: Secondary | ICD-10-CM | POA: Diagnosis not present

## 2020-09-22 DIAGNOSIS — N719 Inflammatory disease of uterus, unspecified: Secondary | ICD-10-CM | POA: Diagnosis not present

## 2020-09-22 DIAGNOSIS — Z9221 Personal history of antineoplastic chemotherapy: Secondary | ICD-10-CM | POA: Diagnosis not present

## 2020-09-22 DIAGNOSIS — Z79899 Other long term (current) drug therapy: Secondary | ICD-10-CM | POA: Diagnosis not present

## 2020-09-22 DIAGNOSIS — Z853 Personal history of malignant neoplasm of breast: Secondary | ICD-10-CM | POA: Diagnosis not present

## 2020-09-22 DIAGNOSIS — Z7984 Long term (current) use of oral hypoglycemic drugs: Secondary | ICD-10-CM | POA: Diagnosis not present

## 2020-09-22 DIAGNOSIS — E119 Type 2 diabetes mellitus without complications: Secondary | ICD-10-CM | POA: Diagnosis not present

## 2020-09-22 DIAGNOSIS — I700 Atherosclerosis of aorta: Secondary | ICD-10-CM | POA: Diagnosis not present

## 2020-09-22 DIAGNOSIS — N858 Other specified noninflammatory disorders of uterus: Secondary | ICD-10-CM | POA: Diagnosis not present

## 2020-09-22 DIAGNOSIS — Z923 Personal history of irradiation: Secondary | ICD-10-CM | POA: Diagnosis not present

## 2020-09-22 NOTE — Patient Instructions (Signed)
It was a pleasure meeting you today. I will call you once I get your biopsy results back, likely tomorrow or Thursday.  It would be normal to have some spotting/bleeding over the next couple of days. Please call if you have heavy bleeding, foul smelling discharge, fevers/chills, significant pelvic pain, or any other new and concerning symptoms.  Our clinic number is [phone].

## 2020-09-22 NOTE — Progress Notes (Signed)
GYNECOLOGIC ONCOLOGY NEW PATIENT CONSULTATION   Patient Name: Kim [facility]  Patient Age: 75 y.o. Date of Service: 09/22/20 Referring Provider: Truitt Merle MD  Primary Care Provider: Aretta Nip, MD Consulting Provider: Jeral Pinch, MD   Assessment/Plan:  Postmenopausal patient with personal history of breast and colon cancer with incidental finding of thickened endometrial lining on recent surveillance CT.  I reviewed in detail the patient's findings on her CT scan and more recently her pelvic ultrasound.  Her lining is thickened in the setting of menopause.  We discussed that a thickened endometrium and an asymptomatic patient is not an absolute indication to perform an endometrial biopsy: However, in certain patients who may be at increased risk despite having no symptoms (patient's family/personal history, obesity), it may be recommended to proceed with biopsy to rule out any intrauterine pathology.  Additionally, as was the case today, cervical stenosis can prevent patients from having symptoms such as bleeding even in the setting of endometrial pathology.  Despite her personal history of colon cancer, the patient does not appear to have Lynch syndrome.  Her colon tumor was MSI stable, MMR intact.  I suspect, though, she still may have increased risk compared to the baseline population especially in the setting of her mother's history of uterine cancer.  Additionally, her weight and thus exposure to estrogen, puts her at increased risk for uterine pathology.  I will call the patient with her biopsy results once back.  We will discuss at that time if any additional procedures are indicated based on the biopsy results.  A copy of this note was sent to the patient's referring provider.   Kim Pinch, MD  Division of Gynecologic Oncology  Department of Obstetrics and Gynecology  [facility]  ___________________________________________  Chief  Complaint: No chief complaint on file.   History of Present Illness:  Kim [facility] is a 75 y.o. y.o. female who is seen in consultation at the request of Dr. Burr [facility] for an evaluation of a thickened endometrial lining.  Patient's cancer history is as noted below.  Significantly, she has a history of breast cancer diagnosed in 2016 and more recently colon cancer diagnosed in 2021.  She completed adjuvant chemotherapy at the beginning of the year.  Her follow-up CT scan after completion of treatment had an incidental finding of a thickened endometrium.  This was followed by a pelvic ultrasound showing an endometrial lining measuring approximately 11 mm with some complex fluid noted within the cavity.  The patient endorses going through menopause at age 40.  She denies any postmenopausal bleeding, spotting, or discharge.  She denies any pelvic pain or cramping.  She is sexually active with her husband.  She endorses a good appetite without nausea or emesis.  She reports soft bowel movements with intermittent diarrhea.  She has 2-3 bowel movements a day, which is increased from her baseline since finishing treatment and having surgery.  She denies any urinary symptoms.  The patient lives in [place] with her husband.  She is retired but enjoys watching her grandchildren.  All 3 of her children live within the [place] or [place] area.  She denies any tobacco or alcohol use.  Treatment History: Oncology History Overview Note  Cancer Staging Breast cancer of upper-outer quadrant of left female breast [facility]) Staging form: Breast, AJCC 7th Edition - Clinical stage from 06/11/2014: Stage 0 (Tis (DCIS), N0, M0) - Unsigned - Pathologic stage from 07/03/2014: Stage Unknown (Tis (DCIS), NX, cM0) -  Signed by Enid Cutter, MD on 07/10/2014 Staging comments: Staged on final lumpectomy specimen by Dr. Donato Heinz.  right colon cancer Staging form: Colon and Rectum, AJCC 8th Edition - Pathologic stage from  11/01/2019: Stage IIIB (pT3, pN2a, cM0) - Signed by Kim Merle, MD on 11/29/2019    Malignant neoplasm of female breast ([place])  05/29/2014 Initial Biopsy   Left breast needle core biopsy: Grade 2, DCIS with calcs. ER+ (100%), PR+ (96%).    06/04/2014 Initial Diagnosis   Left breast DCIS with calcifications, ER 100%, PR 96%   06/10/2014 Breast MRI   Left breast: 2.4 x 1.3 x 1.1 cm area of patchy non-mass enhancement upper outer quadrant includes postbiopsy seroma; Right breast: 1.2 cm previously biopsied stable benign fibroadenoma   06/12/2014 Procedure   Genetic counseling/testing: Identified 1 VUS on CHEK2 gene. Remainder of 17 gene panel tested negative and included: ATM, BARD1, BRCA 1/2, BRIP1, CDH1, CHEK2, EPCAM, MLH1, MSH2, MSH6, NBN, NF1, PALB2, PTEN, RAD50, RAD51C, RAD51D, STK11, and TP53.    07/01/2014 Surgery   Left breast lumpectomy ([facility]): Grade 1, DCIS, spanning 2.3 cm, 1 mm margin, ER 100%, PR 96%   07/31/2014 - 08/28/2014 Radiation Therapy   Adjuvant RT completed [facility]). Left breast: Total dose 42.5 Gy over 17 fractions. Left breast boost: Total dose 7.5 Gy over 3 fractions.    09/14/2014 - 01/13/2020 Anti-estrogen oral therapy   Anastrazole $RemoveBefo'1mg'hhFGHiiSuOK$  daily. Planned duration of treatment: 5 years [place]). Completed in 01/2020.    09/25/2014 Survivorship   Survivorship Care Plan given to patient and reviewed with her in person.    right colon cancer  09/19/2019 Imaging   CT AP W contrast 09/19/19  IMPRESSION Fullness in the cecum, cannot exclude a mass. No evidence for metastatic disease is identified.    09/24/2019 Procedure   Colonoscopy by Dr Eber Jones 09/24/19 IMPRESSION 1. The colon was redundant  2. Mild diverticulosis was noted through the entire examined colon 3. Single 47mm polyp was found in the ascending colon; polypectomy was performed using snare cautery and biopsy forceps 4. Mild diverticulosis was notes in the descending colon and sigmoid colon.  5. Single polyp was  found in the sigmoid colon, polypectomy was performed with cold forceps.  6. Single polyp was found in the rectosigmoid colon; polypectomy was performed with cold snare  7. Small internal hemorrhoids  8. Large mass was found at the cecum; multiple biopsies of the area were performed using cold forceps; injection (tattooing) was performed distal to the mass.    09/24/2019 Initial Biopsy   INTERPRETATION AND DIAGNOSIS:  A. Cecum, biopsy:  Invasive moderately differentiated adenocarcinoma.  see comment  B. Polyp @ ascending colon, polypectomy:  Tubular Adenoma  C. Polyp @ sigmoid colon Polypectomy:  hyperplastic polyp.  D. Polyp @ rectosigmoid colon, Polypectomy:  Hyperplastic Polyp      10/16/2019 Imaging   CT Chest IMPRESSION: 1. Multiple small pulmonary nodules measuring 5 mm or less in size in the lungs. These are nonspecific and are typically considered statistically likely benign. However, given the patient's history of primary malignancy, close attention on follow-up studies is recommended to ensure stability. 2. Aortic atherosclerosis, in addition to right coronary artery disease. Assessment for potential risk factor modification, dietary therapy or pharmacologic therapy may be warranted, if clinically indicated. 3. There are calcifications of the aortic valve and mitral annulus. Echocardiographic correlation for evaluation of potential valvular dysfunction may be warranted if clinically indicated. 4. Small hiatal hernia.   Aortic Atherosclerosis (ICD10-I70.0).  11/01/2019 Initial Diagnosis   Colon cancer ([place])   11/01/2019 Surgery   LAPAROSCOPIC PARTIAL COLECTOMY by Dr Marcello Moores and Dr Johney Maine   11/01/2019 Pathology Results   FINAL MICROSCOPIC DIAGNOSIS:   A. COLON, PROXIMAL RIGHT, COLECTOMY:  - Invasive colonic adenocarcinoma, 5 cm.  - Tumor invades through the muscularis propria into pericolonic tissues.   - Margins of resection are not involved.  - Metastatic carcinoma  in (5) of (13) lymph nodes.  - See oncology table.    MSI Stable  Mismatch repair normal  MLH1 - Preserved nuclear expression (greater 50% tumor expression) MSH2 - Preserved nuclear expression (greater 50% tumor expression) MSH6 - Preserved nuclear expression (greater 50% tumor expression) PMS2 - Preserved nuclear expression (greater 50% tumor expression)   11/01/2019 Cancer Staging   Staging form: Colon and Rectum, AJCC 8th Edition - Pathologic stage from 11/01/2019: Stage IIIB (pT3, pN2a, cM0) - Signed by Kim Merle, MD on 11/29/2019   12/10/2019 Procedure   PAC placed 12/10/19   12/17/2019 - 06/01/2020 Chemotherapy   FOLFOX q2weeks starting in 2 weeks starting 12/17/19. Held 01/27/20-02/10/20 due to b/l PE. Oxaliplatin held C11-12 due to neuropathy. Completed on 06/01/20    PAST MEDICAL HISTORY:  Past Medical History:  Diagnosis Date  . Aortic atherosclerosis ([place])   . Arthritis    feet, lower back  . Basal cell carcinoma    arm  . Breast cancer of upper-outer quadrant of left female breast ([place]) 06/04/2014  . Cataract    immature on the left  . Colon cancer ([place]) 08/2019  . Diabetes mellitus without complication ([place])   . Diverticulosis   . Dizziness    > 67yrs ago;took Antivert   . Family history of anesthesia complication    sister slow to wake up with anesthesia  . Family history of breast cancer   . Family history of colon cancer   . Family history of uterine cancer   . GERD (gastroesophageal reflux disease)    takes occasional TUMs  . History of bronchitis    > 82yrs ago  . History of colon polyps   . History of hiatal hernia    Small noted on CT  . Hypertension    takes Losartan daily and HCTZ  . Iron deficiency anemia   . Joint pain   . Numbness    to toes on each foot  . Peripheral neuropathy    feet and toes  . Personal history of radiation therapy   . Pulmonary nodules    Noted on CT  . Radiation 07/31/14-08/28/14   Left Breast 20 fxs  . Seasonal allergies     takes Claritin prn  . Urinary frequency   . Vitamin D deficiency    takes VIt D daily     PAST SURGICAL HISTORY:  Past Surgical History:  Procedure Laterality Date  . BREAST BIOPSY Bilateral   . BREAST LUMPECTOMY Left   . BREAST LUMPECTOMY WITH RADIOACTIVE SEED LOCALIZATION Left 07/01/2014   Procedure: LEFT BREAST LUMPECTOMY WITH RADIOACTIVE SEED LOCALIZATION;  Surgeon: Excell Seltzer, MD;  Location: [facility] [address];  Service: General;  Laterality: Left;  . CATARACT EXTRACTION Right   . COLONOSCOPY    . LAPAROSCOPIC PARTIAL COLECTOMY N/A 11/01/2019   Procedure: LAPAROSCOPIC PARTIAL COLECTOMY;  Surgeon: Leighton Ruff, MD;  Location: [facility];  Service: General;  Laterality: N/A;  . PORTACATH PLACEMENT N/A 12/10/2019   Procedure: INSERTION PORT-A-CATH ULTRASOUND GUIDED IN RIGHT IJ;  Surgeon: Leighton Ruff, MD;  Location:  [facility];  Service: General;  Laterality: N/A;  . TOTAL KNEE ARTHROPLASTY Left 10/24/2012   Procedure: TOTAL KNEE ARTHROPLASTY;  Surgeon: Kerin Salen, MD;  Location: [facility];  Service: Orthopedics;  Laterality: Left;  . TOTAL KNEE ARTHROPLASTY Right 01/09/2013   Procedure: TOTAL KNEE ARTHROPLASTY;  Surgeon: Kerin Salen, MD;  Location: [facility];  Service: Orthopedics;  Laterality: Right;  . TUBAL LIGATION      OB/GYN HISTORY:  OB History  Gravida Para Term Preterm AB Living  $Remov'4 3       3  'IQrkPv$ SAB IAB Ectopic Multiple Live Births               # Outcome Date GA Lbr Len/2nd Weight Sex Delivery Anes PTL Lv  4 Gravida           3 Para           2 Para           1 Para             No LMP recorded. Patient is postmenopausal.  Age at menarche: 75 Age at menopause: 58 Hx of HRT: Denies Hx of STDs: Denies Last pap: 2012 History of abnormal pap smears: Denies  SCREENING STUDIES:  Last mammogram: 2021  Last colonoscopy: 2021 Last bone density: 08/2020  MEDICATIONS: Outpatient Encounter Medications as of 09/22/2020  Medication Sig  . Acetaminophen (TYLENOL  EXTRA STRENGTH PO) Take 2 tablets by mouth as needed.  Marland Kitchen apixaban (ELIQUIS) 5 MG TABS tablet Take 1 tablet (5 mg total) by mouth 2 (two) times daily.  Marland Kitchen b complex vitamins capsule Take 1 capsule by mouth daily. Patient unsure of dose  . Cholecalciferol (VITAMIN D) 2000 UNITS CAPS Take 2,000 Units by mouth daily.   Marland Kitchen gabapentin (NEURONTIN) 100 MG capsule Take 100 mg by mouth 3 (three) times daily.  Marland Kitchen lidocaine-prilocaine (EMLA) cream Apply to affected area once (Patient taking differently: Apply 1 application topically daily as needed (port access).)  . loratadine (CLARITIN) 10 MG tablet Take 10 mg by mouth daily as needed for allergies.  Marland Kitchen losartan (COZAAR) 25 MG tablet Take 25 mg by mouth daily.  . metFORMIN (GLUCOPHAGE) 500 MG tablet Take 1 tablet (500 mg total) by mouth daily with breakfast. (Patient taking differently: Take 500 mg by mouth in the morning and at bedtime.)  . Multiple Vitamins-Minerals (MULTIVITAMIN WITH MINERALS) tablet Take 1 tablet by mouth daily.  Marland Kitchen omeprazole (PRILOSEC) 20 MG capsule Take 20 mg by mouth daily before breakfast.   . pravastatin (PRAVACHOL) 10 MG tablet Take 10 mg by mouth daily.  Marland Kitchen ibuprofen (ADVIL) 200 MG tablet Take 400 mg by mouth every 6 (six) hours as needed for moderate pain.  (Patient not taking: Reported on 09/21/2020)   No facility-administered encounter medications on file as of 09/22/2020.    ALLERGIES:  Allergies  Allergen Reactions  . Oxycodone Nausea And Vomiting     FAMILY HISTORY:  Family History  Problem Relation Age of Onset  . Uterine cancer Mother        deceased 32  . Breast cancer Sister 27       currently 45  . Colon cancer Brother 53       currently 10  . Cancer Maternal Uncle        unk. primary; deceased 81s  . Thyroid cancer Daughter 18       currently 91; type?  . Breast cancer Sister  SOCIAL HISTORY:  Social Connections: Not on file    REVIEW OF SYSTEMS:  Pertinent positives include diarrhea, back pain,  numbness Denies appetite changes, fevers, chills, fatigue, unexplained weight changes. Denies hearing loss, neck lumps or masses, mouth sores, ringing in ears or voice changes. Denies cough or wheezing.  Denies shortness of breath. Denies chest pain or palpitations. Denies leg swelling. Denies abdominal distention, pain, blood in stools, constipation, nausea, vomiting, or early satiety. Denies pain with intercourse, dysuria, frequency, hematuria or incontinence. Denies hot flashes, pelvic pain, vaginal bleeding or vaginal discharge.   Denies joint pain or muscle pain/cramps. Denies itching, rash, or wounds. Denies dizziness, headaches or seizures. Denies swollen lymph nodes or glands, denies easy bruising or bleeding. Denies anxiety, depression, confusion, or decreased concentration.  Physical Exam:  Vital Signs for this encounter:  Blood pressure 139/60, pulse 72, temperature 98.9 F (37.2 C), temperature source Oral, resp. rate 18, height $RemoveBe'5\' 6"'EjnIjsAat$  (1.676 m), weight 239 lb (108.4 kg), SpO2 100 %. Body mass index is 38.58 kg/m. General: Alert, oriented, no acute distress.  HEENT: Normocephalic, atraumatic. Sclera anicteric.  Chest: Clear to auscultation bilaterally. No wheezes, rhonchi, or rales. Cardiovascular: Regular rate and rhythm, no murmurs, rubs, or gallops.  Abdomen: Obese. Normoactive bowel sounds. Soft, nondistended, nontender to palpation. No masses or hepatosplenomegaly appreciated. No palpable fluid wave.  Well-healed mini laparotomy and laparoscopic incisions. Extremities: Grossly normal range of motion. Warm, well perfused.  Trace edema bilaterally.  Skin: No rashes or lesions.  Lymphatics: No cervical, supraclavicular, or inguinal adenopathy.  GU:  Normal external female genitalia. No lesions. No discharge or bleeding.             Bladder/urethra:  No lesions or masses, well supported bladder.             Vagina: Mildly atrophic vaginal mucosa.  Rectocele noted.  No  discharge or bleeding.             Cervix: Normal appearing, no lesions.             Uterus: Small, mobile, no parametrial involvement or nodularity.             Adnexa: No masses appreciated.  Endometrial biopsy procedure Preop diagnosis: Thickened endometrial lining Postoperative diagnosis: Same as above Physician: Berline Lopes MD Specimen: Endometrial biopsy Estimated blood loss: Minimal Procedure in detail: The procedure was discussed with the patient's including risks, benefits, and alternatives.  After this discussion, the patient gave verbal consent.  She was placed in dorsal lithotomy position and a speculum was placed in the vagina.  Once the cervix was well visualized, it was cleansed with Betadine x3.  A single-tooth tenaculum was then placed on the anterior lip of the cervix.  Endometrial Pipelle was attempted to cannulate the os but stenosis noted. An os finder was then used to cannulate the cervical os.  Endometrial Pipelle was then advanced to 8 cm.  2 passes were performed with moderate tissue and mucus/fluid obtained.  This was placed in formalin to be sent to pathology.  All instruments were removed after hemostasis of the tenaculum sites was assured.  Patient tolerated the procedure well.  LABORATORY AND RADIOLOGIC DATA:  Outside medical records were reviewed to synthesize the above history, along with the history and physical obtained during the visit.   Lab Results  Component Value Date   WBC 6.7 08/26/2020   HGB 12.0 08/26/2020   HCT 37.2 08/26/2020   PLT 245 08/26/2020   GLUCOSE 128 (H)  08/26/2020   ALT 18 08/26/2020   AST 28 08/26/2020   NA 143 08/26/2020   K 4.3 08/26/2020   CL 106 08/26/2020   CREATININE 0.80 08/26/2020   BUN 21 08/26/2020   CO2 26 08/26/2020   INR 1.0 01/27/2020   HGBA1C 7.5 (H) 01/27/2020   Pelvic ultrasound 5/13: IMPRESSION: 1. Endometrial stripe measures up to 11 mm in thickness, considered abnormal for an asymptomatic post-menopausal  female. Superimposed hypoechoic area favored to reflect a small amount of complex fluid within the endometrial cavity. Endometrial sampling should be considered to exclude carcinoma. 2. Otherwise normal pelvic ultrasound.  CT C/A/P 4/27: IMPRESSION: 1. Status post right hemicolectomy. No evidence for metastatic disease in the chest, abdomen, or pelvis. 2. Endometrial stripe is thickened and heterogeneous measuring up to 14 mm thickness. Endometrial neoplasm a concern. Pelvic ultrasound recommended to further evaluate. 3. 7 mm short axis ileocolic lymph node with adjacent additional tiny right lower quadrant mesenteric nodes. These are within normal limits by size criteria, but attention on follow-up recommended. 4. Stable tiny bilateral pulmonary nodules comparing to chest CT 10/16/2019. Features most consistent with benign etiology, but attention on follow-up recommended. 5. Pelvic floor laxity. 6. Aortic Atherosclerosis (ICD10-I70.0).

## 2020-09-24 LAB — SURGICAL PATHOLOGY

## 2020-09-25 ENCOUNTER — Telehealth: Payer: Self-pay

## 2020-09-25 NOTE — Telephone Encounter (Signed)
Spoke with Barnett Applebaum regarding her endometrial biopsy results. No Cancer, or precancer present. No other procedures need to be done at this time. Plan for regular follow up unless she develops vaginal bleeding. Patient states she is having some spotting after the biopsy and is on Eliquis. Per Joylene John, NP that is to be expected. If it does not stop over the next few weeks or gets worse to notify us. Patient verbalizes understanding and has office phone number.

## 2020-10-02 ENCOUNTER — Ambulatory Visit
Admission: RE | Admit: 2020-10-02 | Discharge: 2020-10-02 | Disposition: A | Discharge status: Home / Self Care | Payer: Medicare Other | Source: Ambulatory Visit | Attending: Hematology and Oncology | Admitting: Hematology and Oncology

## 2020-10-02 ENCOUNTER — Other Ambulatory Visit: Payer: Self-pay

## 2020-10-02 DIAGNOSIS — Z1231 Encounter for screening mammogram for malignant neoplasm of breast: Secondary | ICD-10-CM

## 2020-10-12 ENCOUNTER — Inpatient Hospital Stay: Payer: Medicare Other | Attending: Hematology

## 2020-10-12 ENCOUNTER — Other Ambulatory Visit: Payer: Self-pay

## 2020-10-12 DIAGNOSIS — C182 Malignant neoplasm of ascending colon: Secondary | ICD-10-CM | POA: Insufficient documentation

## 2020-10-12 DIAGNOSIS — Z452 Encounter for adjustment and management of vascular access device: Secondary | ICD-10-CM | POA: Insufficient documentation

## 2020-10-12 DIAGNOSIS — Z95828 Presence of other vascular implants and grafts: Secondary | ICD-10-CM

## 2020-10-12 MED ORDER — HEPARIN SOD (PORK) LOCK FLUSH 100 UNIT/ML IV SOLN
500.0000 [IU] | Freq: Once | INTRAVENOUS | Status: AC | PRN
  Administered 2020-10-12: 500 [IU]
  Filled 2020-10-12: qty 5

## 2020-10-12 MED ORDER — SODIUM CHLORIDE 0.9% FLUSH
10.0000 mL | INTRAVENOUS | Status: DC | PRN
  Administered 2020-10-12: 10 mL
  Filled 2020-10-12: qty 10

## 2020-11-09 DIAGNOSIS — E785 Hyperlipidemia, unspecified: Secondary | ICD-10-CM | POA: Diagnosis not present

## 2020-11-09 DIAGNOSIS — Z0000 Encounter for general adult medical examination without abnormal findings: Secondary | ICD-10-CM | POA: Diagnosis not present

## 2020-11-09 DIAGNOSIS — E1169 Type 2 diabetes mellitus with other specified complication: Secondary | ICD-10-CM | POA: Diagnosis not present

## 2020-11-09 DIAGNOSIS — E559 Vitamin D deficiency, unspecified: Secondary | ICD-10-CM | POA: Diagnosis not present

## 2020-11-10 ENCOUNTER — Other Ambulatory Visit: Payer: Self-pay

## 2020-11-29 NOTE — Progress Notes (Signed)
[name]   [phone] Fax:([phone]) [phone]   Clinic Follow up Note   Patient Care Team: Rankins, Bill Salinas, MD as PCP - General (Family Medicine) Excell Seltzer, MD (Inactive) as Consulting Physician (General Surgery) Rolm Bookbinder, MD as Consulting Physician (General Surgery) Thea Silversmith, MD as Consulting Physician (Radiation Oncology) Holley Bouche, NP (Inactive) as Nurse Practitioner (Nurse Practitioner) Truitt Merle, MD as Consulting Physician (Hematology) Leighton Ruff, MD as Consulting Physician (General Surgery) Nicholas Lose, MD as Consulting Physician (Hematology and Oncology) Leighton Ruff, MD as Consulting Physician (General Surgery)  Date of Service:  11/30/2020  CHIEF COMPLAINT: F/u of colon cancer and breast cancer   SUMMARY OF ONCOLOGIC HISTORY: Oncology History Overview Note  Cancer Staging Breast cancer of upper-outer quadrant of left female breast [facility]) Staging form: Breast, AJCC 7th Edition - Clinical stage from 06/11/2014: Stage 0 (Tis (DCIS), N0, M0) - Unsigned - Pathologic stage from 07/03/2014: Stage Unknown (Tis (DCIS), NX, cM0) - Signed by Enid Cutter, MD on 07/10/2014 Staging comments: Staged on final lumpectomy specimen by Dr. Donato Heinz.  right colon cancer Staging form: Colon and Rectum, AJCC 8th Edition - Pathologic stage from 11/01/2019: Stage IIIB (pT3, pN2a, cM0) - Signed by Truitt Merle, MD on 11/29/2019    Malignant neoplasm of female breast ([place])  05/29/2014 Initial Biopsy   Left breast needle core biopsy: Grade 2, DCIS with calcs. ER+ (100%), PR+ (96%).     06/04/2014 Initial Diagnosis   Left breast DCIS with calcifications, ER 100%, PR 96%    06/10/2014 Breast MRI   Left breast: 2.4 x 1.3 x 1.1 cm area of patchy non-mass enhancement upper outer quadrant includes postbiopsy seroma; Right breast: 1.2 cm previously biopsied stable benign fibroadenoma    06/12/2014 Procedure   Genetic counseling/testing:  Identified 1 VUS on CHEK2 gene. Remainder of 17 gene panel tested negative and included: ATM, BARD1, BRCA 1/2, BRIP1, CDH1, CHEK2, EPCAM, MLH1, MSH2, MSH6, NBN, NF1, PALB2, PTEN, RAD50, RAD51C, RAD51D, STK11, and TP53.     07/01/2014 Surgery   Left breast lumpectomy ([facility]): Grade 1, DCIS, spanning 2.3 cm, 1 mm margin, ER 100%, PR 96%    07/31/2014 - 08/28/2014 Radiation Therapy   Adjuvant RT completed Pablo Ledger). Left breast: Total dose 42.5 Gy over 17 fractions. Left breast boost: Total dose 7.5 Gy over 3 fractions.     09/14/2014 - 01/13/2020 Anti-estrogen oral therapy   Anastrazole $RemoveBefo'1mg'AcvCcvQliwW$  daily. Planned duration of treatment: 5 years [place]). Completed in 01/2020.    09/25/2014 Survivorship   Survivorship Care Plan given to patient and reviewed with her in person.     right colon cancer  09/19/2019 Imaging   CT AP W contrast 09/19/19  IMPRESSION Fullness in the cecum, cannot exclude a mass. No evidence for metastatic disease is identified.    09/24/2019 Procedure   Colonoscopy by Dr Eber Jones 09/24/19 IMPRESSION 1. The colon was redundant  2. Mild diverticulosis was noted through the entire examined colon 3. Single 50mm polyp was found in the ascending colon; polypectomy was performed using snare cautery and biopsy forceps 4. Mild diverticulosis was notes in the descending colon and sigmoid colon.  5. Single polyp was found in the sigmoid colon, polypectomy was performed with cold forceps.  6. Single polyp was found in the rectosigmoid colon; polypectomy was performed with cold snare  7. Small internal hemorrhoids  8. Large mass was found at the cecum; multiple biopsies of the area were performed using cold forceps; injection (tattooing) was performed  distal to the mass.    09/24/2019 Initial Biopsy   INTERPRETATION AND DIAGNOSIS:  A. Cecum, biopsy:  Invasive moderately differentiated adenocarcinoma.  see comment  B. Polyp @ ascending colon, polypectomy:  Tubular Adenoma  C. Polyp @  sigmoid colon Polypectomy:  hyperplastic polyp.  D. Polyp @ rectosigmoid colon, Polypectomy:  Hyperplastic Polyp      10/16/2019 Imaging   CT Chest IMPRESSION: 1. Multiple small pulmonary nodules measuring 5 mm or less in size in the lungs. These are nonspecific and are typically considered statistically likely benign. However, given the patient's history of primary malignancy, close attention on follow-up studies is recommended to ensure stability. 2. Aortic atherosclerosis, in addition to right coronary artery disease. Assessment for potential risk factor modification, dietary therapy or pharmacologic therapy may be warranted, if clinically indicated. 3. There are calcifications of the aortic valve and mitral annulus. Echocardiographic correlation for evaluation of potential valvular dysfunction may be warranted if clinically indicated. 4. Small hiatal hernia.   Aortic Atherosclerosis (ICD10-I70.0).   11/01/2019 Initial Diagnosis   Colon cancer ([place])   11/01/2019 Surgery   LAPAROSCOPIC PARTIAL COLECTOMY by Dr Marcello Moores and Dr Johney Maine   11/01/2019 Pathology Results   FINAL MICROSCOPIC DIAGNOSIS:   A. COLON, PROXIMAL RIGHT, COLECTOMY:  - Invasive colonic adenocarcinoma, 5 cm.  - Tumor invades through the muscularis propria into pericolonic tissues.   - Margins of resection are not involved.  - Metastatic carcinoma in (5) of (13) lymph nodes.  - See oncology table.    MSI Stable  Mismatch repair normal  MLH1 - Preserved nuclear expression (greater 50% tumor expression) MSH2 - Preserved nuclear expression (greater 50% tumor expression) MSH6 - Preserved nuclear expression (greater 50% tumor expression) PMS2 - Preserved nuclear expression (greater 50% tumor expression)   11/01/2019 Cancer Staging   Staging form: Colon and Rectum, AJCC 8th Edition - Pathologic stage from 11/01/2019: Stage IIIB (pT3, pN2a, cM0) - Signed by Truitt Merle, MD on 11/29/2019    12/10/2019 Procedure   PAC  placed 12/10/19   12/17/2019 - 06/01/2020 Chemotherapy   FOLFOX q2weeks starting in 2 weeks starting 12/17/19. Held 01/27/20-02/10/20 due to b/l PE. Oxaliplatin held C11-12 due to neuropathy. Completed on 06/01/20      CURRENT THERAPY:  Surveillance   INTERVAL HISTORY:  Kim Castro is here for a follow up of colon cancer. She was last seen by me on 08/31/20. She presents to the clinic alone.  She reports intermittent neuropathy that depends on what she's doing. She notes cold intensifies her symptoms. She reports continued soft bowel movements but denies diarrhea. She notes she goes 1-2 times per day. She denies stomach issues, nausea. She notes she had an instance of a sharp pain in her lower left side that resolved quickly. She notes it has not happened before or since.  All other systems were reviewed with the patient and are negative.  MEDICAL HISTORY:  Past Medical History:  Diagnosis Date   Aortic atherosclerosis (HCC)    Arthritis    feet, lower back   Basal cell carcinoma    arm   Breast cancer of upper-outer quadrant of left female breast ([place]) 06/04/2014   Cataract    immature on the left   Colon cancer ([place]) 08/2019   Diabetes mellitus without complication (HCC)    Diverticulosis    Dizziness    > 61yrs ago;took Antivert    Family history of anesthesia complication    sister slow to wake up with anesthesia  Family history of breast cancer    Family history of colon cancer    Family history of uterine cancer    GERD (gastroesophageal reflux disease)    takes occasional TUMs   History of bronchitis    > 55yrs ago   History of colon polyps    History of hiatal hernia    Small noted on CT   Hypertension    takes Losartan daily and HCTZ   Iron deficiency anemia    Joint pain    Numbness    to toes on each foot   Peripheral neuropathy    feet and toes   Personal history of radiation therapy    Pulmonary nodules    Noted on CT   Radiation 07/31/14-08/28/14    Left Breast 20 fxs   Seasonal allergies    takes Claritin prn   Urinary frequency    Vitamin D deficiency    takes VIt D daily    SURGICAL HISTORY: Past Surgical History:  Procedure Laterality Date   BREAST BIOPSY Bilateral    BREAST LUMPECTOMY Left    BREAST LUMPECTOMY WITH RADIOACTIVE SEED LOCALIZATION Left 07/01/2014   Procedure: LEFT BREAST LUMPECTOMY WITH RADIOACTIVE SEED LOCALIZATION;  Surgeon: Excell Seltzer, MD;  Location: [facility] [place];  Service: General;  Laterality: Left;   CATARACT EXTRACTION Right    COLONOSCOPY     LAPAROSCOPIC PARTIAL COLECTOMY N/A 11/01/2019   Procedure: LAPAROSCOPIC PARTIAL COLECTOMY;  Surgeon: Leighton Ruff, MD;  Location: [facility];  Service: General;  Laterality: N/A;   PORTACATH PLACEMENT N/A 12/10/2019   Procedure: INSERTION PORT-A-CATH ULTRASOUND GUIDED IN RIGHT IJ;  Surgeon: Leighton Ruff, MD;  Location: [facility];  Service: General;  Laterality: N/A;   TOTAL KNEE ARTHROPLASTY Left 10/24/2012   Procedure: TOTAL KNEE ARTHROPLASTY;  Surgeon: Kerin Salen, MD;  Location: [place];  Service: Orthopedics;  Laterality: Left;   TOTAL KNEE ARTHROPLASTY Right 01/09/2013   Procedure: TOTAL KNEE ARTHROPLASTY;  Surgeon: Kerin Salen, MD;  Location: [place];  Service: Orthopedics;  Laterality: Right;   TUBAL LIGATION      I have reviewed the social history and family history with the patient and they are unchanged from previous note.  ALLERGIES:  is allergic to oxycodone.  MEDICATIONS:  Current Outpatient Medications  Medication Sig Dispense Refill   Acetaminophen (TYLENOL EXTRA STRENGTH PO) Take 2 tablets by mouth as needed.     apixaban (ELIQUIS) 5 MG TABS tablet Take 1 tablet (5 mg total) by mouth 2 (two) times daily. 60 tablet 3   b complex vitamins capsule Take 1 capsule by mouth daily. Patient unsure of dose     Cholecalciferol (VITAMIN D) 2000 UNITS CAPS Take 2,000 Units by mouth daily.      gabapentin (NEURONTIN) 100 MG capsule Take 100 mg  by mouth 3 (three) times daily.     ibuprofen (ADVIL) 200 MG tablet Take 400 mg by mouth every 6 (six) hours as needed for moderate pain.  (Patient not taking: Reported on 09/21/2020)     lidocaine-prilocaine (EMLA) cream Apply to affected area once (Patient taking differently: Apply 1 application topically daily as needed (port access).) 30 g 3   loratadine (CLARITIN) 10 MG tablet Take 10 mg by mouth daily as needed for allergies.     losartan (COZAAR) 25 MG tablet Take 25 mg by mouth daily.     metFORMIN (GLUCOPHAGE) 500 MG tablet Take 1 tablet (500 mg total) by mouth daily with breakfast. (Patient  taking differently: Take 500 mg by mouth in the morning and at bedtime.)     Multiple Vitamins-Minerals (MULTIVITAMIN WITH MINERALS) tablet Take 1 tablet by mouth daily.     omeprazole (PRILOSEC) 20 MG capsule Take 20 mg by mouth daily before breakfast.      pravastatin (PRAVACHOL) 10 MG tablet Take 10 mg by mouth daily.     No current facility-administered medications for this visit.    PHYSICAL EXAMINATION: ECOG PERFORMANCE STATUS: 0  Vitals:   11/30/20 0840  BP: (!) 156/63  Pulse: 71  Resp: 19  Temp: 98.2 F (36.8 C)  SpO2: 98%   Filed Weights   11/30/20 0840  Weight: 248 lb 11.2 oz (112.8 kg)    GENERAL:alert, no distress and comfortable SKIN: skin color, texture, turgor are normal, no rashes or significant lesions EYES: normal, Conjunctiva are pink and non-injected, sclera clear  NECK: supple, thyroid normal size, non-tender, without nodularity LYMPH:  no palpable lymphadenopathy in the cervical, axillary  LUNGS: clear to auscultation and percussion with normal breathing effort HEART: regular rate & rhythm and no murmurs and no lower extremity edema ABDOMEN:abdomen soft, non-tender and normal bowel sounds (+) Surgical incision healed well  Musculoskeletal:no cyanosis of digits and no clubbing  NEURO: alert & oriented x 3 with fluent speech, no focal motor/sensory  deficits   LABORATORY DATA:  I have reviewed the data as listed CBC Latest Ref Rng & Units 11/30/2020 08/26/2020 06/01/2020  WBC 4.0 - 10.5 K/uL 6.2 6.7 3.6(L)  Hemoglobin 12.0 - 15.0 g/dL 12.0 12.0 10.1(L)  Hematocrit 36.0 - 46.0 % 36.5 37.2 32.0(L)  Platelets 150 - 400 K/uL 228 245 160     CMP Latest Ref Rng & Units 11/30/2020 08/26/2020 06/01/2020  Glucose 70 - 99 mg/dL 134(H) 128(H) 180(H)  BUN 8 - 23 mg/dL $Remove'20 21 13  'floVcYp$ Creatinine 0.44 - 1.00 mg/dL 0.80 0.80 0.79  Sodium 135 - 145 mmol/L 144 143 142  Potassium 3.5 - 5.1 mmol/L 4.3 4.3 4.1  Chloride 98 - 111 mmol/L 109 106 110  CO2 22 - 32 mmol/L $RemoveB'26 26 24  'suhxjyMM$ Calcium 8.9 - 10.3 mg/dL 9.6 9.3 9.4  Total Protein 6.5 - 8.1 g/dL 7.0 7.3 7.2  Total Bilirubin 0.3 - 1.2 mg/dL 1.1 1.0 1.2  Alkaline Phos 38 - 126 U/L 90 100 143(H)  AST 15 - 41 U/L 27 28 61(H)  ALT 0 - 44 U/L 23 18 45(H)      RADIOGRAPHIC STUDIES: I have personally reviewed the radiological images as listed and agreed with the findings in the report. No results found.   ASSESSMENT & PLAN:  Kim Castro is a 76 y.o. female with   1. Right colon cancer, pT3N2aM0 stage IIB, MSS -She was diagnosed in 08/2019 with cecal mass biopsy showed moderately differentiated adenocarcinoma. CT chest from 10/16/19 did shows multiple small lung nodules that were nonspecific but likely benign. Will monitor.  -She underwent colon surgery with Dr Marcello Moores on 11/01/19. Path showed overall Stage IIIB cancer.  -She was treated with adjuvant chemotherapy FOLFOX for 6 months to reduce her risk of recurrence with. She is currently on surveillance.  -Her CT CAP from 08/26/20 showed no evidence for metastatic disease in the chest, abdomen, or pelvis.  -She is now over a year out from her initial diagnosis and due for routine colonoscopy. She would like a referral to a different provider. -We reviewed recommended surveillance, with follow up every 3 months for the first 2 years,  and then every 6 months to  complete 5 years. We will repeat CT in 3 months. -She is clinically doing well, lab reviewed, exam was unremarkable, there is no clinical concern for recurrence -Follow-up in 3 months   2. Neuropathy, secondary to Oxaliplatin G2 -S/p C7 she started having numbness in hands, L>R which become prolonged with tingling s/p C10.  -Oxaliplatin dose reduced and ultimately held with C11-12.  -Continue Gabapentin 100mg , can increase to TID. So far manageable.    3. Iron deficient anemia -Off chemo, her anemia has resolved. She is no longer taking oral iron.    4. H/o left breast DCIS, G2, ER/PR+ -Dx in 05/2014. Treated with left lumpectomy with Dr Excell Seltzer, adjuvant RT with Dr Pablo Ledger. She has been on Anastrozole 08/2014-01/13/20. She was previously under the care of Dr Lindi Adie.  -DEXA 09/11/20 T score +1.2 normal.  -mammogram on 10/02/20 was negative. -We also discussed a breast MRI for breast cancer screening, I will order on her next visit   5. Submassive bilateral pulmonary embolism, LE Edema  -S/p C3 chemo, her 01/27/20 CTA showed PE with right heart strain. She also has bilateral lower extremity edema and Doppler ultrasound was negative for DVT bilaterally.  -She was treated with 6 months of Eliquis, Lasix and oral potassium BID for LE Edema.   5. Comorbidities: Arthritis, DM, HTN, GERD  -f/u with PCP Dr. Rex Kras      PLAN:  -Port flush in 6 weeks -Follow-up in 3 months with lab, flush and CT CAP with contrast a few days ago -I will order screening breast MRI at her next visit.  No problem-specific Assessment & Plan notes found for this encounter.   Orders Placed This Encounter  Procedures   CT CHEST ABDOMEN PELVIS W CONTRAST    Standing Status:   Future    Standing Expiration Date:   11/30/2021    Order Specific Question:   If indicated for the ordered procedure, I authorize the administration of contrast media per Radiology protocol    Answer:   Yes    Order Specific Question:    Preferred imaging location?    Answer:   [facility]    Order Specific Question:   Release to patient    Answer:   Immediate    Order Specific Question:   Is Oral Contrast requested for this exam?    Answer:   Yes, Per Radiology protocol    Order Specific Question:   Reason for Exam (SYMPTOM  OR DIAGNOSIS REQUIRED)    Answer:   rule out recurrence   Ambulatory referral to Gastroenterology    Referral Priority:   Routine    Referral Type:   Consultation    Referral Reason:   Specialty Services Required    Number of Visits Requested:   1   All questions were answered. The patient knows to call the clinic with any problems, questions or concerns. No barriers to learning was detected. The total time spent in the appointment was 30 minutes.     Truitt Merle, MD 11/30/2020   I, Joslyn Devon, am acting as scribe for Truitt Merle, MD.   I have reviewed the above documentation for accuracy and completeness, and I agree with the above.

## 2020-11-30 ENCOUNTER — Inpatient Hospital Stay: Payer: Medicare Other | Attending: Hematology | Admitting: Hematology

## 2020-11-30 ENCOUNTER — Inpatient Hospital Stay: Payer: Medicare Other

## 2020-11-30 ENCOUNTER — Other Ambulatory Visit: Payer: Self-pay

## 2020-11-30 VITALS — BP 156/63 | HR 71 | Temp 98.2°F | Resp 19 | Ht 66.0 in | Wt 248.7 lb

## 2020-11-30 DIAGNOSIS — C182 Malignant neoplasm of ascending colon: Secondary | ICD-10-CM | POA: Diagnosis not present

## 2020-11-30 DIAGNOSIS — Z86718 Personal history of other venous thrombosis and embolism: Secondary | ICD-10-CM | POA: Insufficient documentation

## 2020-11-30 DIAGNOSIS — Z86000 Personal history of in-situ neoplasm of breast: Secondary | ICD-10-CM | POA: Insufficient documentation

## 2020-11-30 DIAGNOSIS — Z9221 Personal history of antineoplastic chemotherapy: Secondary | ICD-10-CM | POA: Insufficient documentation

## 2020-11-30 DIAGNOSIS — Z923 Personal history of irradiation: Secondary | ICD-10-CM | POA: Diagnosis not present

## 2020-11-30 DIAGNOSIS — E119 Type 2 diabetes mellitus without complications: Secondary | ICD-10-CM | POA: Diagnosis not present

## 2020-11-30 DIAGNOSIS — T451X5A Adverse effect of antineoplastic and immunosuppressive drugs, initial encounter: Secondary | ICD-10-CM | POA: Insufficient documentation

## 2020-11-30 DIAGNOSIS — Z85038 Personal history of other malignant neoplasm of large intestine: Secondary | ICD-10-CM | POA: Diagnosis not present

## 2020-11-30 DIAGNOSIS — Z79899 Other long term (current) drug therapy: Secondary | ICD-10-CM | POA: Insufficient documentation

## 2020-11-30 DIAGNOSIS — I10 Essential (primary) hypertension: Secondary | ICD-10-CM | POA: Insufficient documentation

## 2020-11-30 DIAGNOSIS — G620 Drug-induced polyneuropathy: Secondary | ICD-10-CM | POA: Diagnosis not present

## 2020-11-30 DIAGNOSIS — Z95828 Presence of other vascular implants and grafts: Secondary | ICD-10-CM

## 2020-11-30 DIAGNOSIS — Z862 Personal history of diseases of the blood and blood-forming organs and certain disorders involving the immune mechanism: Secondary | ICD-10-CM | POA: Insufficient documentation

## 2020-11-30 LAB — CBC WITH DIFFERENTIAL/PLATELET
Abs Immature Granulocytes: 0.02 10*3/uL (ref 0.00–0.07)
Basophils Absolute: 0.1 10*3/uL (ref 0.0–0.1)
Basophils Relative: 1 %
Eosinophils Absolute: 0.2 10*3/uL (ref 0.0–0.5)
Eosinophils Relative: 3 %
HCT: 36.5 % (ref 36.0–46.0)
Hemoglobin: 12 g/dL (ref 12.0–15.0)
Immature Granulocytes: 0 %
Lymphocytes Relative: 37 %
Lymphs Abs: 2.3 10*3/uL (ref 0.7–4.0)
MCH: 32.1 pg (ref 26.0–34.0)
MCHC: 32.9 g/dL (ref 30.0–36.0)
MCV: 97.6 fL (ref 80.0–100.0)
Monocytes Absolute: 0.5 10*3/uL (ref 0.1–1.0)
Monocytes Relative: 8 %
Neutro Abs: 3.2 10*3/uL (ref 1.7–7.7)
Neutrophils Relative %: 51 %
Platelets: 228 10*3/uL (ref 150–400)
RBC: 3.74 MIL/uL — ABNORMAL LOW (ref 3.87–5.11)
RDW: 12.9 % (ref 11.5–15.5)
WBC: 6.2 10*3/uL (ref 4.0–10.5)
nRBC: 0 % (ref 0.0–0.2)

## 2020-11-30 LAB — COMPREHENSIVE METABOLIC PANEL
ALT: 23 U/L (ref 0–44)
AST: 27 U/L (ref 15–41)
Albumin: 3.8 g/dL (ref 3.5–5.0)
Alkaline Phosphatase: 90 U/L (ref 38–126)
Anion gap: 9 (ref 5–15)
BUN: 20 mg/dL (ref 8–23)
CO2: 26 mmol/L (ref 22–32)
Calcium: 9.6 mg/dL (ref 8.9–10.3)
Chloride: 109 mmol/L (ref 98–111)
Creatinine, Ser: 0.8 mg/dL (ref 0.44–1.00)
GFR, Estimated: >60 mL/min (ref >60)
Glucose, Bld: 134 mg/dL — ABNORMAL HIGH (ref 70–99)
Potassium: 4.3 mmol/L (ref 3.5–5.1)
Sodium: 144 mmol/L (ref 135–145)
Total Bilirubin: 1.1 mg/dL (ref 0.3–1.2)
Total Protein: 7 g/dL (ref 6.5–8.1)

## 2020-11-30 LAB — CEA (IN HOUSE-CHCC): CEA (CHCC-In House): 3.41 ng/mL (ref 0.00–5.00)

## 2020-11-30 MED ORDER — SODIUM CHLORIDE 0.9% FLUSH
10.0000 mL | INTRAVENOUS | Status: DC | PRN
  Administered 2020-11-30: 10 mL
  Filled 2020-11-30: qty 10

## 2020-11-30 MED ORDER — HEPARIN SOD (PORK) LOCK FLUSH 100 UNIT/ML IV SOLN
500.0000 [IU] | Freq: Once | INTRAVENOUS | Status: AC | PRN
  Administered 2020-11-30: 500 [IU]
  Filled 2020-11-30: qty 5

## 2020-11-30 NOTE — Addendum Note (Signed)
Addended by: Bertram Millard A on: 11/30/2020 08:34 AM   Modules accepted: Orders

## 2020-12-07 ENCOUNTER — Telehealth: Payer: Self-pay | Admitting: Internal Medicine

## 2020-12-07 NOTE — Telephone Encounter (Signed)
The patient should have a colonoscopy because of history of colon cancer.  Please find out if she is still on apixaban or Eliquis.  If they have stopped that (I think they might have) she can be a direct.  If they have not stopped that let me know and I will communicate with Dr. Burr Medico about holding it and we could still perhaps do a direct.

## 2020-12-07 NOTE — Telephone Encounter (Signed)
Hi Dr. Carlean Purl, we have received a referral from from  the [facility] for pt to have a repeat colon. Patient had colon last year at [facility]. Records are available in [place]. Could you please review them and advise on scheduling? Thank you.

## 2020-12-08 ENCOUNTER — Encounter: Payer: Self-pay | Admitting: Internal Medicine

## 2020-12-23 ENCOUNTER — Other Ambulatory Visit: Payer: Self-pay | Admitting: Hematology

## 2020-12-25 ENCOUNTER — Other Ambulatory Visit: Payer: Self-pay

## 2020-12-25 DIAGNOSIS — R200 Anesthesia of skin: Secondary | ICD-10-CM

## 2020-12-25 MED ORDER — PREGABALIN 25 MG PO CAPS: 25.0000 mg | ORAL_CAPSULE | Freq: Every day | ORAL | 1 refills | Status: DC

## 2021-01-11 ENCOUNTER — Inpatient Hospital Stay: Payer: Medicare Other | Attending: Hematology

## 2021-01-11 ENCOUNTER — Other Ambulatory Visit: Payer: Self-pay

## 2021-01-11 DIAGNOSIS — Z95828 Presence of other vascular implants and grafts: Secondary | ICD-10-CM

## 2021-01-11 DIAGNOSIS — C182 Malignant neoplasm of ascending colon: Secondary | ICD-10-CM

## 2021-01-11 DIAGNOSIS — Z452 Encounter for adjustment and management of vascular access device: Secondary | ICD-10-CM | POA: Diagnosis not present

## 2021-01-11 DIAGNOSIS — Z85038 Personal history of other malignant neoplasm of large intestine: Secondary | ICD-10-CM | POA: Insufficient documentation

## 2021-01-11 MED ORDER — HEPARIN SOD (PORK) LOCK FLUSH 100 UNIT/ML IV SOLN
500.0000 [IU] | Freq: Once | INTRAVENOUS | Status: AC | PRN
  Administered 2021-01-11: 500 [IU]

## 2021-01-11 MED ORDER — SODIUM CHLORIDE 0.9% FLUSH
10.0000 mL | INTRAVENOUS | Status: DC | PRN
  Administered 2021-01-11: 10 mL

## 2021-01-27 ENCOUNTER — Encounter: Payer: Self-pay | Admitting: Internal Medicine

## 2021-01-27 ENCOUNTER — Other Ambulatory Visit: Payer: Self-pay

## 2021-01-27 ENCOUNTER — Ambulatory Visit (AMBULATORY_SURGERY_CENTER): Payer: Medicare Other | Admitting: *Deleted

## 2021-01-27 VITALS — Ht 66.0 in | Wt 248.0 lb

## 2021-01-27 DIAGNOSIS — Z800 Family history of malignant neoplasm of digestive organs: Secondary | ICD-10-CM

## 2021-01-27 DIAGNOSIS — Z85038 Personal history of other malignant neoplasm of large intestine: Secondary | ICD-10-CM

## 2021-01-27 NOTE — Progress Notes (Signed)
Patient's pre-visit was done today over the phone with the patient due to COVID-19 pandemic. Name,DOB and address verified. Patient denies any allergies to Eggs and Soy. Patient denies any problems with anesthesia/sedation. Patient is not taking any diet pills or blood thinners. No home Oxygen. Packet of Prep instructions mailed to patient including a copy of a consent form-pt is aware. Patient understands to call us back with any questions or concerns. Patient is aware of our care-partner policy and Covid-19 safety protocol.   EMMI education assigned to the patient for the procedure, sent to MyChart.   The patient is COVID-19 vaccinated.   

## 2021-02-10 ENCOUNTER — Other Ambulatory Visit: Payer: Self-pay

## 2021-02-10 ENCOUNTER — Ambulatory Visit (AMBULATORY_SURGERY_CENTER): Payer: Medicare Other | Admitting: Internal Medicine

## 2021-02-10 ENCOUNTER — Encounter: Payer: Self-pay | Admitting: Internal Medicine

## 2021-02-10 VITALS — BP 123/54 | HR 62 | Temp 97.5°F | Resp 13 | Ht 66.0 in | Wt 248.0 lb

## 2021-02-10 DIAGNOSIS — Z85038 Personal history of other malignant neoplasm of large intestine: Secondary | ICD-10-CM | POA: Diagnosis not present

## 2021-02-10 DIAGNOSIS — E119 Type 2 diabetes mellitus without complications: Secondary | ICD-10-CM | POA: Diagnosis not present

## 2021-02-10 DIAGNOSIS — Z800 Family history of malignant neoplasm of digestive organs: Secondary | ICD-10-CM | POA: Diagnosis not present

## 2021-02-10 DIAGNOSIS — I10 Essential (primary) hypertension: Secondary | ICD-10-CM | POA: Diagnosis not present

## 2021-02-10 MED ORDER — SODIUM CHLORIDE 0.9 % IV SOLN: 500.0000 mL | Freq: Once | INTRAVENOUS | Status: DC

## 2021-02-10 NOTE — Progress Notes (Signed)
[facility] History and Physical   Primary Care Physician:  Pcp, No   Reason for Procedure:   Hx colon cancer  Plan:    colonoscopy     HPI: Kim Castro is a 75 y.o. 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   Past Medical History:  Diagnosis Date   Aortic atherosclerosis (HCC)    Arthritis    feet, lower back   Basal cell carcinoma    arm   Breast cancer of upper-outer quadrant of left female breast ([place]) 06/04/2014   Cataract    immature on the left   Colon cancer ([place]) 08/2019   Diabetes mellitus without complication ([place])    Diverticulosis    Dizziness    > 82yrs ago;took Antivert    Family history of anesthesia complication    sister slow to wake up with anesthesia   Family history of breast cancer    Family history of colon cancer    Family history of uterine cancer    GERD (gastroesophageal reflux disease)    takes occasional TUMs   History of bronchitis    > 61yrs ago   History of colon polyps    History of hiatal hernia    Small noted on CT   History of pulmonary embolus (PE)    Hypertension    takes Losartan daily and HCTZ   Iron deficiency anemia    Joint pain    Numbness    to toes on each foot   Peripheral neuropathy    feet and toes   Personal history of radiation therapy    Pulmonary nodules    Noted on CT   Radiation 07/31/14-08/28/14   Left Breast 20 fxs   Seasonal allergies    takes Claritin prn   Urinary frequency    Vitamin D deficiency    takes VIt D daily    Past Surgical History:  Procedure Laterality Date   BREAST BIOPSY Bilateral    BREAST LUMPECTOMY Left    BREAST LUMPECTOMY WITH RADIOACTIVE SEED LOCALIZATION Left 07/01/2014   Procedure: LEFT BREAST LUMPECTOMY WITH RADIOACTIVE SEED LOCALIZATION;  Surgeon: Excell Seltzer, MD;  Location: [facility];  Service: General;  Laterality: Left;   CATARACT EXTRACTION Right    COLONOSCOPY   09/24/2019   [facility]   LAPAROSCOPIC PARTIAL COLECTOMY N/A 11/01/2019   Procedure: LAPAROSCOPIC PARTIAL COLECTOMY;  Surgeon: Leighton Ruff, MD;  Location: [facility];  Service: General;  Laterality: N/A;   POLYPECTOMY     PORTACATH PLACEMENT N/A 12/10/2019   Procedure: INSERTION PORT-A-CATH ULTRASOUND GUIDED IN RIGHT IJ;  Surgeon: Leighton Ruff, MD;  Location: [facility];  Service: General;  Laterality: N/A;   TOTAL KNEE ARTHROPLASTY Left 10/24/2012   Procedure: TOTAL KNEE ARTHROPLASTY;  Surgeon: Kerin Salen, MD;  Location: [facility];  Service: Orthopedics;  Laterality: Left;   TOTAL KNEE ARTHROPLASTY Right 01/09/2013   Procedure: TOTAL KNEE ARTHROPLASTY;  Surgeon: Kerin Salen, MD;  Location: [facility];  Service: Orthopedics;  Laterality: Right;   TUBAL LIGATION      Prior to Admission medications   Medication Sig Start Date End Date Taking? Authorizing Provider  aspirin EC 81 MG tablet Take 81 mg by mouth daily. Swallow whole.    [provider]  b complex vitamins capsule Take 1 capsule by mouth daily. Patient unsure of dose    [provider]  Cholecalciferol (VITAMIN D) 2000 UNITS CAPS Take 2,000 Units by  mouth daily.     [provider]  ibuprofen (ADVIL) 200 MG tablet Take 400 mg by mouth every 6 (six) hours as needed for moderate pain.    [provider]  lidocaine-prilocaine (EMLA) cream Apply to affected area once Patient taking differently: Apply 1 application topically daily as needed (port access). 11/30/19   Truitt Merle, MD  loratadine (CLARITIN) 10 MG tablet Take 10 mg by mouth daily as needed for allergies.    [provider]  losartan (COZAAR) 25 MG tablet Take 25 mg by mouth daily.    [provider]  metFORMIN (GLUCOPHAGE) 500 MG tablet Take 1 tablet (500 mg total) by mouth daily with breakfast. Patient taking differently: Take 500 mg by mouth in the morning and at bedtime. 11/06/18   Nicholas Lose, MD  Multiple Vitamins-Minerals  (MULTIVITAMIN WITH MINERALS) tablet Take 1 tablet by mouth daily.    [provider]  omeprazole (PRILOSEC) 20 MG capsule Take 20 mg by mouth daily before breakfast.     [provider]  pravastatin (PRAVACHOL) 10 MG tablet Take 10 mg by mouth daily. 10/01/19   [provider]  pregabalin (LYRICA) 25 MG capsule Take 1 capsule (25 mg total) by mouth daily. 12/25/20   Truitt Merle, MD    Current Outpatient Medications  Medication Sig Dispense Refill   aspirin EC 81 MG tablet Take 81 mg by mouth daily. Swallow whole.     b complex vitamins capsule Take 1 capsule by mouth daily. Patient unsure of dose     Cholecalciferol (VITAMIN D) 2000 UNITS CAPS Take 2,000 Units by mouth daily.      ibuprofen (ADVIL) 200 MG tablet Take 400 mg by mouth every 6 (six) hours as needed for moderate pain.     lidocaine-prilocaine (EMLA) cream Apply to affected area once (Patient taking differently: Apply 1 application topically daily as needed (port access).) 30 g 3   loratadine (CLARITIN) 10 MG tablet Take 10 mg by mouth daily as needed for allergies.     losartan (COZAAR) 25 MG tablet Take 25 mg by mouth daily.     metFORMIN (GLUCOPHAGE) 500 MG tablet Take 1 tablet (500 mg total) by mouth daily with breakfast. (Patient taking differently: Take 500 mg by mouth in the morning and at bedtime.)     Multiple Vitamins-Minerals (MULTIVITAMIN WITH MINERALS) tablet Take 1 tablet by mouth daily.     omeprazole (PRILOSEC) 20 MG capsule Take 20 mg by mouth daily before breakfast.      pravastatin (PRAVACHOL) 10 MG tablet Take 10 mg by mouth daily.     pregabalin (LYRICA) 25 MG capsule Take 1 capsule (25 mg total) by mouth daily. 30 capsule 1   Current Facility-Administered Medications  Medication Dose Route Frequency Provider Last Rate Last Admin   0.9 %  sodium chloride infusion  500 mL Intravenous Once [name], MD        Allergies as of 02/10/2021 - Review Complete 02/10/2021  Allergen  Reaction Noted   Oxycodone Nausea And Vomiting 01/03/2013    Family History  Problem Relation Age of Onset   Uterine cancer Mother        deceased 27   Breast cancer Sister 27       currently 62   Breast cancer Sister    Colon cancer Brother 57   Cancer Maternal Uncle        unk. primary; deceased 33s   Colon polyps Daughter  Thyroid cancer Daughter 48       currently 63; type?   Esophageal cancer Neg Hx    Rectal cancer Neg Hx    Stomach cancer Neg Hx     Social History   Socioeconomic History   Marital status: Married    Spouse name: Not on file   Number of children: 3   Years of education: Not on file   Highest education level: Not on file  Occupational History   Occupation: retired   Tobacco Use   Smoking status: Never   Smokeless tobacco: Never  Vaping Use   Vaping Use: Never used  Substance and Sexual Activity   Alcohol use: No   Drug use: No   Sexual activity: Yes    Birth control/protection: Surgical, Post-menopausal     Review of Systems:  All other review of systems negative except as mentioned in the HPI.  Physical Exam: Vital signs BP (!) 137/59   Pulse 65   Temp (!) 97.5 F (36.4 C)   Ht 5\' 6"  (1.676 m)   Wt 248 lb (112.5 kg)   SpO2 100%   BMI 40.03 kg/m   General:   Alert,  Well-developed, well-nourished, pleasant and cooperative in NAD Lungs:  Clear throughout to auscultation.   Heart:  Regular rate and rhythm; no murmurs, clicks, rubs,  or gallops. Abdomen:  Soft, nontender and nondistended. Normal bowel sounds.   Neuro/Psych:  Alert and cooperative. Normal mood and affect. A and O x 3   @Hermenegildo Clausen  Simonne Maffucci, MD, [facility] [phone] (pager) 02/10/2021 10:44 AM@

## 2021-02-10 NOTE — Progress Notes (Signed)
Pt Drowsy. VSS. To PACU, report to RN. No anesthetic complications noted.  

## 2021-02-10 NOTE — Patient Instructions (Addendum)
The colonoscopy did not show any signs of cancer and no polyps this time.  You do have diverticulosis - thickened muscle rings and pouches in the colon wall. Please read the handout about this condition.  Based upon the knowledge and guidelines we follow you should repeat a colonoscopy in about 3 years. We will place you on a list.  I appreciate the opportunity to care for you. Kim Mayer, MD, [facility]  Resume previous diet and continue current medications. Repeat Colonoscopy in 3 years for surveillance. (Cc: Dr. Burr Medico) YOU HAD AN ENDOSCOPIC PROCEDURE TODAY AT [facility]:   Refer to the procedure report that was given to you for any specific questions about what was found during the examination.  If the procedure report does not answer your questions, please call your gastroenterologist to clarify.  If you requested that your care partner not be given the details of your procedure findings, then the procedure report has been included in a sealed envelope for you to review at your convenience later.  YOU SHOULD EXPECT: Some feelings of bloating in the abdomen. Passage of more gas than usual.  Walking can help get rid of the air that was put into your GI tract during the procedure and reduce the bloating. If you had a lower endoscopy (such as a colonoscopy or flexible sigmoidoscopy) you may notice spotting of blood in your stool or on the toilet paper. If you underwent a bowel prep for your procedure, you may not have a normal bowel movement for a few days.  Please Note:  You might notice some irritation and congestion in your nose or some drainage.  This is from the oxygen used during your procedure.  There is no need for concern and it should clear up in a day or so.  SYMPTOMS TO REPORT IMMEDIATELY:  Following lower endoscopy (colonoscopy or flexible sigmoidoscopy):  Excessive amounts of blood in the stool  Significant tenderness or worsening of abdominal pains  Swelling of the  abdomen that is new, acute  Fever of 100F or higher  For urgent or emergent issues, a gastroenterologist can be reached at any hour by calling [phone]. Do not use MyChart messaging for urgent concerns.    DIET:  We do recommend a small meal at first, but then you may proceed to your regular diet.  Drink plenty of fluids but you should avoid alcoholic beverages for 24 hours.  ACTIVITY:  You should plan to take it easy for the rest of today and you should NOT DRIVE or use heavy machinery until tomorrow (because of the sedation medicines used during the test).    FOLLOW UP: Our staff will call the number listed on your records 48-72 hours following your procedure to check on you and address any questions or concerns that you may have regarding the information given to you following your procedure. If we do not reach you, we will leave a message.  We will attempt to reach you two times.  During this call, we will ask if you have developed any symptoms of COVID 19. If you develop any symptoms (ie: fever, flu-like symptoms, shortness of breath, cough etc.) before then, please call [phone].  If you test positive for Covid 19 in the 2 weeks post procedure, please call and report this information to [place].    If any biopsies were taken you will be contacted by phone or by letter within the next 1-3 weeks.  Please call us at [phone] if you have not heard about the biopsies in 3 weeks.    SIGNATURES/CONFIDENTIALITY: You and/or your care partner have signed paperwork which will be entered into your electronic medical record.  These signatures attest to the fact that that the information above on your After Visit Summary has been reviewed and is understood.  Full responsibility of the confidentiality of this discharge information lies with you and/or your care-partner.

## 2021-02-10 NOTE — Progress Notes (Signed)
Pt's states no medical or surgical changes since previsit or office visit. 

## 2021-02-10 NOTE — Op Note (Signed)
[facility] Patient Name: Kim Castro Procedure Date: 02/10/2021 10:41 AM MRN: [id number] Endoscopist: [name] , MD Age: 75 Referring MD:  Date of Birth: 14-Sep-1945 Gender: Female Account #: [id number] Procedure:                Colonoscopy Indications:              High risk colon cancer surveillance: Personal                            history of colon cancer, Last colonoscopy: 2021 Medicines:                Propofol per Anesthesia, Monitored Anesthesia Care Procedure:                Pre-Anesthesia Assessment:                           - Prior to the procedure, a History and Physical                            was performed, and patient medications and                            allergies were reviewed. The patient's tolerance of                            previous anesthesia was also reviewed. The risks                            and benefits of the procedure and the sedation                            options and risks were discussed with the patient.                            All questions were answered, and informed consent                            was obtained. Prior Anticoagulants: The patient has                            taken no previous anticoagulant or antiplatelet                            agents. ASA Grade Assessment: III - A patient with                            severe systemic disease. After reviewing the risks                            and benefits, the patient was deemed in                            satisfactory condition to undergo the procedure.  After obtaining informed consent, the colonoscope                            was passed under direct vision. Throughout the                            procedure, the patient's blood pressure, pulse, and                            oxygen saturations were monitored continuously. The                            Olympus CF-HQ190L [id number]) Colonoscope was                             introduced through the anus and advanced to the the                            ileocolonic anastomosis. The colonoscopy was                            performed without difficulty. The patient tolerated                            the procedure well. The quality of the bowel                            preparation was good. The rectum and Ileocolonic                            anastomsis areas were photographed. The bowel                            preparation used was Miralax via split dose                            instruction. Scope In: 10:58:41 AM Scope Out: 11:10:49 AM Scope Withdrawal Time: 0 hours 8 minutes 36 seconds  Total Procedure Duration: 0 hours 12 minutes 8 seconds  Findings:                 The perianal and digital rectal examinations were                            normal.                           Multiple diverticula were found in the sigmoid                            colon.                           There was evidence of a prior end-to-side  ileo-colonic anastomosis in the ascending colon.                            This was patent and was characterized by healthy                            appearing mucosa. The anastomosis was traversed.                           The exam was otherwise without abnormality on                            direct and retroflexion views. Complications:            No immediate complications. Estimated Blood Loss:     Estimated blood loss: none. Impression:               - Diverticulosis in the sigmoid colon.                           - Patent end-to-side ileo-colonic anastomosis,                            characterized by healthy appearing mucosa.                           - The examination was otherwise normal on direct                            and retroflexion views.                           - No specimens collected. Recommendation:           - Patient has a contact number available for                             emergencies. The signs and symptoms of potential                            delayed complications were discussed with the                            patient. Return to normal activities tomorrow.                            Written discharge instructions were provided to the                            patient.                           - Resume previous diet.                           - Continue present medications.                           -  Repeat colonoscopy in 3 years for surveillance.                           cc: Dr. Zollie Pee, MD 02/10/2021 11:16:58 AM This report has been signed electronically.

## 2021-02-12 ENCOUNTER — Telehealth: Payer: Self-pay | Admitting: *Deleted

## 2021-02-12 DIAGNOSIS — S90451A Superficial foreign body, right great toe, initial encounter: Secondary | ICD-10-CM | POA: Diagnosis not present

## 2021-02-12 DIAGNOSIS — L03031 Cellulitis of right toe: Secondary | ICD-10-CM | POA: Diagnosis not present

## 2021-02-12 DIAGNOSIS — Z6841 Body Mass Index (BMI) 40.0 and over, adult: Secondary | ICD-10-CM | POA: Diagnosis not present

## 2021-02-12 NOTE — Telephone Encounter (Signed)
  Follow up Call-  Call back number 02/10/2021  Post procedure Call Back phone  # [phone]  Permission to leave phone message Yes  Some recent data might be hidden     Patient questions:  Do you have a fever, pain , or abdominal swelling? No. Pain Score  0 *  Have you tolerated food without any problems? Yes.    Have you been able to return to your normal activities? Yes.    Do you have any questions about your discharge instructions: Diet   No. Medications  No. Follow up visit  No.  Do you have questions or concerns about your Care? No.  Actions: * If pain score is 4 or above: No action needed, pain <4.  Have you developed a fever since your procedure? no  2.   Have you had an respiratory symptoms (SOB or cough) since your procedure? no  3.   Have you tested positive for COVID 19 since your procedure no  4.   Have you had any family members/close contacts diagnosed with the COVID 19 since your procedure?  no   If yes to any of these questions please route to Joylene John, RN and Joella Prince, RN

## 2021-03-01 ENCOUNTER — Other Ambulatory Visit: Payer: Self-pay

## 2021-03-01 DIAGNOSIS — C182 Malignant neoplasm of ascending colon: Secondary | ICD-10-CM

## 2021-03-01 DIAGNOSIS — D649 Anemia, unspecified: Secondary | ICD-10-CM

## 2021-03-02 ENCOUNTER — Inpatient Hospital Stay: Payer: Medicare Other | Attending: Hematology

## 2021-03-02 ENCOUNTER — Other Ambulatory Visit: Payer: Self-pay

## 2021-03-02 ENCOUNTER — Ambulatory Visit (HOSPITAL_COMMUNITY)
Admission: RE | Admit: 2021-03-02 | Discharge: 2021-03-02 | Disposition: A | Discharge status: Home / Self Care | Payer: Medicare Other | Source: Ambulatory Visit | Attending: Hematology | Admitting: Hematology

## 2021-03-02 DIAGNOSIS — C50412 Malignant neoplasm of upper-outer quadrant of left female breast: Secondary | ICD-10-CM | POA: Insufficient documentation

## 2021-03-02 DIAGNOSIS — G629 Polyneuropathy, unspecified: Secondary | ICD-10-CM | POA: Insufficient documentation

## 2021-03-02 DIAGNOSIS — Z170 Estrogen receptor positive status [ER+]: Secondary | ICD-10-CM | POA: Insufficient documentation

## 2021-03-02 DIAGNOSIS — Z95828 Presence of other vascular implants and grafts: Secondary | ICD-10-CM

## 2021-03-02 DIAGNOSIS — I2699 Other pulmonary embolism without acute cor pulmonale: Secondary | ICD-10-CM | POA: Insufficient documentation

## 2021-03-02 DIAGNOSIS — C182 Malignant neoplasm of ascending colon: Secondary | ICD-10-CM

## 2021-03-02 DIAGNOSIS — K5730 Diverticulosis of large intestine without perforation or abscess without bleeding: Secondary | ICD-10-CM | POA: Diagnosis not present

## 2021-03-02 DIAGNOSIS — I10 Essential (primary) hypertension: Secondary | ICD-10-CM | POA: Insufficient documentation

## 2021-03-02 DIAGNOSIS — R600 Localized edema: Secondary | ICD-10-CM | POA: Insufficient documentation

## 2021-03-02 DIAGNOSIS — N281 Cyst of kidney, acquired: Secondary | ICD-10-CM | POA: Diagnosis not present

## 2021-03-02 DIAGNOSIS — E1136 Type 2 diabetes mellitus with diabetic cataract: Secondary | ICD-10-CM | POA: Insufficient documentation

## 2021-03-02 DIAGNOSIS — M4186 Other forms of scoliosis, lumbar region: Secondary | ICD-10-CM | POA: Diagnosis not present

## 2021-03-02 DIAGNOSIS — C180 Malignant neoplasm of cecum: Secondary | ICD-10-CM | POA: Insufficient documentation

## 2021-03-02 DIAGNOSIS — C19 Malignant neoplasm of rectosigmoid junction: Secondary | ICD-10-CM | POA: Diagnosis not present

## 2021-03-02 LAB — CBC WITH DIFFERENTIAL/PLATELET
Abs Immature Granulocytes: 0.02 10*3/uL (ref 0.00–0.07)
Basophils Absolute: 0 10*3/uL (ref 0.0–0.1)
Basophils Relative: 1 %
Eosinophils Absolute: 0.2 10*3/uL (ref 0.0–0.5)
Eosinophils Relative: 3 %
HCT: 36.1 % (ref 36.0–46.0)
Hemoglobin: 12.2 g/dL (ref 12.0–15.0)
Immature Granulocytes: 0 %
Lymphocytes Relative: 38 %
Lymphs Abs: 2.3 10*3/uL (ref 0.7–4.0)
MCH: 32.9 pg (ref 26.0–34.0)
MCHC: 33.8 g/dL (ref 30.0–36.0)
MCV: 97.3 fL (ref 80.0–100.0)
Monocytes Absolute: 0.5 10*3/uL (ref 0.1–1.0)
Monocytes Relative: 8 %
Neutro Abs: 3.1 10*3/uL (ref 1.7–7.7)
Neutrophils Relative %: 50 %
Platelets: 257 10*3/uL (ref 150–400)
RBC: 3.71 MIL/uL — ABNORMAL LOW (ref 3.87–5.11)
RDW: 12.7 % (ref 11.5–15.5)
WBC: 6.1 10*3/uL (ref 4.0–10.5)
nRBC: 0 % (ref 0.0–0.2)

## 2021-03-02 LAB — COMPREHENSIVE METABOLIC PANEL
ALT: 22 U/L (ref 0–44)
AST: 26 U/L (ref 15–41)
Albumin: 3.9 g/dL (ref 3.5–5.0)
Alkaline Phosphatase: 85 U/L (ref 38–126)
Anion gap: 10 (ref 5–15)
BUN: 19 mg/dL (ref 8–23)
CO2: 23 mmol/L (ref 22–32)
Calcium: 9 mg/dL (ref 8.9–10.3)
Chloride: 107 mmol/L (ref 98–111)
Creatinine, Ser: 0.73 mg/dL (ref 0.44–1.00)
GFR, Estimated: >60 mL/min (ref >60)
Glucose, Bld: 127 mg/dL — ABNORMAL HIGH (ref 70–99)
Potassium: 4.1 mmol/L (ref 3.5–5.1)
Sodium: 140 mmol/L (ref 135–145)
Total Bilirubin: 0.9 mg/dL (ref 0.3–1.2)
Total Protein: 7 g/dL (ref 6.5–8.1)

## 2021-03-02 LAB — CEA (IN HOUSE-CHCC): CEA (CHCC-In House): 4.17 ng/mL (ref 0.00–5.00)

## 2021-03-02 MED ORDER — SODIUM CHLORIDE 0.9% FLUSH
10.0000 mL | INTRAVENOUS | Status: DC | PRN
  Administered 2021-03-02: 10 mL

## 2021-03-02 MED ORDER — HEPARIN SOD (PORK) LOCK FLUSH 100 UNIT/ML IV SOLN
INTRAVENOUS | Status: AC
  Filled 2021-03-02: qty 5

## 2021-03-02 MED ORDER — HEPARIN SOD (PORK) LOCK FLUSH 100 UNIT/ML IV SOLN
500.0000 [IU] | Freq: Once | INTRAVENOUS | Status: AC
  Administered 2021-03-02: 500 [IU] via INTRAVENOUS

## 2021-03-02 MED ORDER — IOHEXOL 350 MG/ML SOLN
80.0000 mL | Freq: Once | INTRAVENOUS | Status: AC | PRN
  Administered 2021-03-02: 80 mL via INTRAVENOUS

## 2021-03-04 ENCOUNTER — Other Ambulatory Visit: Payer: Self-pay

## 2021-03-04 ENCOUNTER — Inpatient Hospital Stay: Payer: Medicare Other | Admitting: Hematology

## 2021-03-04 VITALS — BP 132/62 | HR 67 | Temp 98.4°F | Resp 18 | Ht 66.0 in | Wt 249.9 lb

## 2021-03-04 DIAGNOSIS — C50919 Malignant neoplasm of unspecified site of unspecified female breast: Secondary | ICD-10-CM | POA: Diagnosis not present

## 2021-03-04 DIAGNOSIS — C50412 Malignant neoplasm of upper-outer quadrant of left female breast: Secondary | ICD-10-CM | POA: Diagnosis not present

## 2021-03-04 DIAGNOSIS — I2699 Other pulmonary embolism without acute cor pulmonale: Secondary | ICD-10-CM | POA: Diagnosis not present

## 2021-03-04 DIAGNOSIS — C180 Malignant neoplasm of cecum: Secondary | ICD-10-CM | POA: Diagnosis not present

## 2021-03-04 DIAGNOSIS — I10 Essential (primary) hypertension: Secondary | ICD-10-CM | POA: Diagnosis not present

## 2021-03-04 DIAGNOSIS — C182 Malignant neoplasm of ascending colon: Secondary | ICD-10-CM

## 2021-03-04 DIAGNOSIS — Z170 Estrogen receptor positive status [ER+]: Secondary | ICD-10-CM

## 2021-03-04 DIAGNOSIS — R600 Localized edema: Secondary | ICD-10-CM | POA: Diagnosis not present

## 2021-03-04 DIAGNOSIS — E1136 Type 2 diabetes mellitus with diabetic cataract: Secondary | ICD-10-CM | POA: Diagnosis not present

## 2021-03-04 DIAGNOSIS — G629 Polyneuropathy, unspecified: Secondary | ICD-10-CM | POA: Diagnosis not present

## 2021-03-04 NOTE — Progress Notes (Signed)
[place]   [phone] [phone]   Clinic Follow up Note   Patient Care Team: Rankins, Bill Salinas, MD as PCP - General (Family Medicine) Excell Seltzer, MD (Inactive) as Consulting Physician (General Surgery) Rolm Bookbinder, MD as Consulting Physician (General Surgery) Thea Silversmith, MD as Consulting Physician (Radiation Oncology) Holley Bouche, NP (Inactive) as Nurse Practitioner (Nurse Practitioner) Truitt Merle, MD as Consulting Physician (Hematology) Leighton Ruff, MD as Consulting Physician (General Surgery) Nicholas Lose, MD as Consulting Physician (Hematology and Oncology) Leighton Ruff, MD as Consulting Physician (General Surgery)  Date of Service:  03/04/2021  CHIEF COMPLAINT: f/u of colon and breast cancers  CURRENT THERAPY:  Surveillance  ASSESSMENT & PLAN:  Rickita Forstner is a 75 y.o. female with   1. Right colon cancer, pT3N2aM0 stage IIB, MSS -She was diagnosed in 08/2019 with cecal mass biopsy showed moderately differentiated adenocarcinoma. CT chest from 10/16/19 did shows multiple small lung nodules that were nonspecific but likely benign. Will monitor.  -She underwent colon surgery with Dr Marcello Moores on 11/01/19. Path showed overall Stage IIIB cancer.  -She was treated with adjuvant chemotherapy FOLFOX for 6 months to reduce her risk of recurrence with. She is currently on surveillance.  -surveillance colonoscopy on 02/10/21 under Dr. Carlean Purl showed only diverticulosis. -restaging CT CAP 03/02/21 was NED. There was some progressive endplate sclerosis and endplate irregularity at T2-3, probably due to degenerative endplate findings. She is mostly asymptomatic in the area, so we will defer further imaging at this time. -We reviewed recommended surveillance, with follow up every 3 months for the first 2 years, and then every 6 months to complete 5 years. We will repeat CT in 3 months. -She is clinically doing well, lab  reviewed, exam was unremarkable, there is no clinical concern for recurrence -Follow-up in 3 months   2. Neuropathy, secondary to Oxaliplatin G2 -S/p C7 she started having numbness in hands, L>R which become prolonged with tingling s/p C10.  -Oxaliplatin dose reduced and ultimately held with C11-12.  -Continue Gabapentin 1102m, can increase to TID. So far manageable, rated 4/10 (but worsening with the cold weather)    3. Iron deficient anemia -Off chemo, her anemia has resolved. She is no longer taking oral iron.    4. H/o left breast DCIS, G2, ER/PR+ -Dx in 05/2014. Treated with left lumpectomy with Dr HExcell Seltzer adjuvant RT with Dr WPablo Ledger She has been on Anastrozole 08/2014-01/13/20. She was previously under the care of Dr GLindi Adie  -DEXA 09/11/20 T score +1.2 normal.  -mammogram on 10/02/20 was negative. -We also discussed a breast MRI for breast cancer screening, I will order on her next visit   5. Submassive bilateral pulmonary embolism, LE Edema  -S/p C3 chemo, her 01/27/20 CTA showed PE with right heart strain. She also has bilateral lower extremity edema and Doppler ultrasound was negative for DVT bilaterally.  -She was treated with 6 months of Eliquis, Lasix and oral potassium BID for LE Edema.   5. Comorbidities: Arthritis, DM, HTN, GERD  -she will be switching to Dr. RGena Frayfor primary care in 05/2021.     PLAN:  -Port flush every 6-8 weeks -lab, flush, and f/u in 3 months   No problem-specific Assessment & Plan notes found for this encounter.   SUMMARY OF ONCOLOGIC HISTORY: Oncology History Overview Note  Cancer Staging Breast cancer of upper-outer quadrant of left female breast ([facility] Staging form: Breast, AJCC 7th Edition - Clinical stage from 06/11/2014: Stage 0 (Tis (DCIS),  N0, M0) - Unsigned - Pathologic stage from 07/03/2014: Stage Unknown (Tis (DCIS), NX, cM0) - Signed by Enid Cutter, MD on 07/10/2014 Staging comments: Staged on final lumpectomy specimen by Dr.  Donato Heinz.  right colon cancer Staging form: Colon and Rectum, AJCC 8th Edition - Pathologic stage from 11/01/2019: Stage IIIB (pT3, pN2a, cM0) - Signed by Truitt Merle, MD on 11/29/2019    Malignant neoplasm of female breast ([place])  05/29/2014 Initial Biopsy   Left breast needle core biopsy: Grade 2, DCIS with calcs. ER+ (100%), PR+ (96%).    06/04/2014 Initial Diagnosis   Left breast DCIS with calcifications, ER 100%, PR 96%   06/10/2014 Breast MRI   Left breast: 2.4 x 1.3 x 1.1 cm area of patchy non-mass enhancement upper outer quadrant includes postbiopsy seroma; Right breast: 1.2 cm previously biopsied stable benign fibroadenoma   06/12/2014 Procedure   Genetic counseling/testing: Identified 1 VUS on CHEK2 gene. Remainder of 17 gene panel tested negative and included: ATM, BARD1, BRCA 1/2, BRIP1, CDH1, CHEK2, EPCAM, MLH1, MSH2, MSH6, NBN, NF1, PALB2, PTEN, RAD50, RAD51C, RAD51D, STK11, and TP53.    07/01/2014 Surgery   Left breast lumpectomy ([facility]): Grade 1, DCIS, spanning 2.3 cm, 1 mm margin, ER 100%, PR 96%   07/31/2014 - 08/28/2014 Radiation Therapy   Adjuvant RT completed Pablo Ledger). Left breast: Total dose 42.5 Gy over 17 fractions. Left breast boost: Total dose 7.5 Gy over 3 fractions.    09/14/2014 - 01/13/2020 Anti-estrogen oral therapy   Anastrazole 58m daily. Planned duration of treatment: 5 years ([place]. Completed in 01/2020.    09/25/2014 Survivorship   Survivorship Care Plan given to patient and reviewed with her in person.    right colon cancer  09/19/2019 Imaging   CT AP W contrast 09/19/19  IMPRESSION Fullness in the cecum, cannot exclude a mass. No evidence for metastatic disease is identified.    09/24/2019 Procedure   Colonoscopy by Dr MEber Jones5/25/21 IMPRESSION 1. The colon was redundant  2. Mild diverticulosis was noted through the entire examined colon 3. Single 1109mpolyp was found in the ascending colon; polypectomy was performed using snare cautery and biopsy  forceps 4. Mild diverticulosis was notes in the descending colon and sigmoid colon.  5. Single polyp was found in the sigmoid colon, polypectomy was performed with cold forceps.  6. Single polyp was found in the rectosigmoid colon; polypectomy was performed with cold snare  7. Small internal hemorrhoids  8. Large mass was found at the cecum; multiple biopsies of the area were performed using cold forceps; injection (tattooing) was performed distal to the mass.    09/24/2019 Initial Biopsy   INTERPRETATION AND DIAGNOSIS:  A. Cecum, biopsy:  Invasive moderately differentiated adenocarcinoma.  see comment  B. Polyp @ ascending colon, polypectomy:  Tubular Adenoma  C. Polyp @ sigmoid colon Polypectomy:  hyperplastic polyp.  D. Polyp @ rectosigmoid colon, Polypectomy:  Hyperplastic Polyp      10/16/2019 Imaging   CT Chest IMPRESSION: 1. Multiple small pulmonary nodules measuring 5 mm or less in size in the lungs. These are nonspecific and are typically considered statistically likely benign. However, given the patient's history of primary malignancy, close attention on follow-up studies is recommended to ensure stability. 2. Aortic atherosclerosis, in addition to right coronary artery disease. Assessment for potential risk factor modification, dietary therapy or pharmacologic therapy may be warranted, if clinically indicated. 3. There are calcifications of the aortic valve and mitral annulus. Echocardiographic correlation for evaluation of potential valvular dysfunction  may be warranted if clinically indicated. 4. Small hiatal hernia.   Aortic Atherosclerosis (ICD10-I70.0).   11/01/2019 Initial Diagnosis   Colon cancer ([place])   11/01/2019 Surgery   LAPAROSCOPIC PARTIAL COLECTOMY by Dr Marcello Moores and Dr Johney Maine   11/01/2019 Pathology Results   FINAL MICROSCOPIC DIAGNOSIS:   A. COLON, PROXIMAL RIGHT, COLECTOMY:  - Invasive colonic adenocarcinoma, 5 cm.  - Tumor invades through the  muscularis propria into pericolonic tissues.   - Margins of resection are not involved.  - Metastatic carcinoma in (5) of (13) lymph nodes.  - See oncology table.    MSI Stable  Mismatch repair normal  MLH1 - Preserved nuclear expression (greater 50% tumor expression) MSH2 - Preserved nuclear expression (greater 50% tumor expression) MSH6 - Preserved nuclear expression (greater 50% tumor expression) PMS2 - Preserved nuclear expression (greater 50% tumor expression)   11/01/2019 Cancer Staging   Staging form: Colon and Rectum, AJCC 8th Edition - Pathologic stage from 11/01/2019: Stage IIIB (pT3, pN2a, cM0) - Signed by Truitt Merle, MD on 11/29/2019    12/10/2019 Procedure   PAC placed 12/10/19   12/17/2019 - 06/01/2020 Chemotherapy   FOLFOX q2weeks starting in 2 weeks starting 12/17/19. Held 01/27/20-02/10/20 due to b/l PE. Oxaliplatin held C11-12 due to neuropathy. Completed on 06/01/20      INTERVAL HISTORY:  Thomasene Dubow is here for a follow up of breast and colon cancers. She was last seen by me on 11/30/20. She presents to the clinic alone. She reports chronic back pain, but she reports the pain has started to spread down her right leg for the last few weeks. She notes it is relieved with tylenol.   All other systems were reviewed with the patient and are negative.  MEDICAL HISTORY:  Past Medical History:  Diagnosis Date   Aortic atherosclerosis (HCC)    Arthritis    feet, lower back   Basal cell carcinoma    arm   Breast cancer of upper-outer quadrant of left female breast ([place]) 06/04/2014   Cataract    immature on the left   Colon cancer ([place] [place]) 08/2019   Diabetes mellitus without complication (HCC)    Diverticulosis    Dizziness    > 30yr ago;took Antivert    Family history of anesthesia complication    sister slow to wake up with anesthesia   Family history of breast cancer    Family history of colon cancer    Family history of uterine cancer    GERD  (gastroesophageal reflux disease)    takes occasional TUMs   History of bronchitis    > [place]   History of colon polyps    History of hiatal hernia    Small noted on CT   History of pulmonary embolus (PE)    Hypertension    takes Losartan daily and HCTZ   Iron deficiency anemia    Joint pain    Numbness    to toes on each foot   Peripheral neuropathy    feet and toes   Personal history of radiation therapy    Pulmonary nodules    Noted on CT   Radiation 07/31/14-08/28/14   Left Breast 20 fxs   Seasonal allergies    takes Claritin prn   Urinary frequency    Vitamin D deficiency    takes VIt D daily    SURGICAL HISTORY: Past Surgical History:  Procedure Laterality Date   BREAST BIOPSY Bilateral    BREAST LUMPECTOMY Left  BREAST LUMPECTOMY WITH RADIOACTIVE SEED LOCALIZATION Left 07/01/2014   Procedure: LEFT BREAST LUMPECTOMY WITH RADIOACTIVE SEED LOCALIZATION;  Surgeon: Excell Seltzer, MD;  Location: [facility];  Service: General;  Laterality: Left;   CATARACT EXTRACTION Right    COLONOSCOPY  09/24/2019   [place]   LAPAROSCOPIC PARTIAL COLECTOMY N/A 11/01/2019   Procedure: LAPAROSCOPIC PARTIAL COLECTOMY;  Surgeon: Leighton Ruff, MD;  Location: [facility];  Service: General;  Laterality: N/A;   POLYPECTOMY     PORTACATH PLACEMENT N/A 12/10/2019   Procedure: INSERTION PORT-A-CATH ULTRASOUND GUIDED IN RIGHT IJ;  Surgeon: Leighton Ruff, MD;  Location: [facility];  Service: General;  Laterality: N/A;   TOTAL KNEE ARTHROPLASTY Left 10/24/2012   Procedure: TOTAL KNEE ARTHROPLASTY;  Surgeon: Kerin Salen, MD;  Location: [facility];  Service: Orthopedics;  Laterality: Left;   TOTAL KNEE ARTHROPLASTY Right 01/09/2013   Procedure: TOTAL KNEE ARTHROPLASTY;  Surgeon: Kerin Salen, MD;  Location: [facility];  Service: Orthopedics;  Laterality: Right;   TUBAL LIGATION      I have reviewed the social history and family history with the patient and they are unchanged from  previous note.  ALLERGIES:  is allergic to oxycodone.  MEDICATIONS:  Current Outpatient Medications  Medication Sig Dispense Refill   aspirin EC 81 MG tablet Take 81 mg by mouth daily. Swallow whole.     b complex vitamins capsule Take 1 capsule by mouth daily. Patient unsure of dose     Cholecalciferol (VITAMIN D) 2000 UNITS CAPS Take 2,000 Units by mouth daily.      ibuprofen (ADVIL) 200 MG tablet Take 400 mg by mouth every 6 (six) hours as needed for moderate pain.     lidocaine-prilocaine (EMLA) cream Apply to affected area once (Patient taking differently: Apply 1 application topically daily as needed (port access).) 30 g 3   loratadine (CLARITIN) 10 MG tablet Take 10 mg by mouth daily as needed for allergies.     losartan (COZAAR) 25 MG tablet Take 25 mg by mouth daily.     metFORMIN (GLUCOPHAGE) 500 MG tablet Take 1 tablet (500 mg total) by mouth daily with breakfast. (Patient taking differently: Take 500 mg by mouth in the morning and at bedtime.)     Multiple Vitamins-Minerals (MULTIVITAMIN WITH MINERALS) tablet Take 1 tablet by mouth daily.     omeprazole (PRILOSEC) 20 MG capsule Take 20 mg by mouth daily before breakfast.      pravastatin (PRAVACHOL) 10 MG tablet Take 10 mg by mouth daily.     pregabalin (LYRICA) 25 MG capsule Take 1 capsule (25 mg total) by mouth daily. 30 capsule 1   No current facility-administered medications for this visit.    PHYSICAL EXAMINATION: ECOG PERFORMANCE STATUS: 0 - Asymptomatic  Vitals:   03/04/21 1014  BP: 132/62  Pulse: 67  Resp: 18  Temp: 98.4 F (36.9 C)  SpO2: 96%   Wt Readings from Last 3 Encounters:  03/04/21 249 lb 14.4 oz (113.4 kg)  02/10/21 248 lb (112.5 kg)  01/27/21 248 lb (112.5 kg)     GENERAL:alert, no distress and comfortable SKIN: skin color, texture, turgor are normal, no rashes or significant lesions EYES: normal, Conjunctiva are pink and non-injected, sclera clear  NECK: supple, thyroid normal size,  non-tender, without nodularity LYMPH:  no palpable lymphadenopathy in the cervical, axillary  LUNGS: clear to auscultation and percussion with normal breathing effort HEART: regular rate & rhythm and no lower extremity edema, (+) very mild murmur  ABDOMEN:abdomen soft, non-tender and normal bowel sounds Musculoskeletal:no cyanosis of digits and no clubbing  NEURO: alert & oriented x 3 with fluent speech, no focal motor/sensory deficits  LABORATORY DATA:  I have reviewed the data as listed CBC Latest Ref Rng & Units 03/02/2021 11/30/2020 08/26/2020  WBC 4.0 - 10.5 K/uL 6.1 6.2 6.7  Hemoglobin 12.0 - 15.0 g/dL 12.2 12.0 12.0  Hematocrit 36.0 - 46.0 % 36.1 36.5 37.2  Platelets 150 - 400 K/uL 257 228 245     CMP Latest Ref Rng & Units 03/02/2021 11/30/2020 08/26/2020  Glucose 70 - 99 mg/dL 127(H) 134(H) 128(H)  BUN 8 - 23 mg/dL _0 Creatinine 0.44 - 1.00 mg/dL 0.73 0.80 0.80  Sodium 135 - 145 mmol/L 140 144 143  Potassium 3.5 - 5.1 mmol/L 4.1 4.3 4.3  Chloride 98 - 111 mmol/L 107 109 106  CO2 22 - 32 mmol/L _1 Calcium 8.9 - 10.3 mg/dL 9.0 9.6 9.3  Total Protein 6.5 - 8.1 g/dL 7.0 7.0 7.3  Total Bilirubin 0.3 - 1.2 mg/dL 0.9 1.1 1.0  Alkaline Phos 38 - 126 U/L 85 90 100  AST 15 - 41 U/L _2 ALT 0 - 44 U/L _3 RADIOGRAPHIC STUDIES: I have personally reviewed the radiological images as listed and agreed with the findings in the report. CT CHEST ABDOMEN PELVIS W CONTRAST  Result Date: 03/03/2021 CLINICAL DATA:  Restaging colorectal cancer EXAM: CT CHEST, ABDOMEN, AND PELVIS WITH CONTRAST TECHNIQUE: Multidetector CT imaging of the chest, abdomen and pelvis was performed following the standard protocol during bolus administration of intravenous contrast. CONTRAST:  60m OMNIPAQUE IOHEXOL 350 MG/ML SOLN COMPARISON:  Multiple exams, including 08/26/2020 FINDINGS: CT CHEST FINDINGS Cardiovascular: Right Port-A-Cath tip: SVC. Mild cardiomegaly. Atherosclerotic  calcification of the aortic arch. Mild mitral valve calcification. Mediastinum/Nodes: Lungs/Pleura: Radiation port related findings anteriorly in the left upper lobe with postoperative findings in the left breast. 2 by 3 mm left lower lobe nodule, unchanged from earliest available comparison of 10/17/2019. Additional minimal nodularity in the left lower lobe including 2 adjacent 3-4 mm nodules on image 77 series 4, unchanged from 10/16/2019. Other tiny left lower lobe nodules are likewise unchanged. Musculoskeletal: Thoracic spondylosis. Progressive endplate sclerosis and endplate irregularity at T2-3, minimally increased from 08/26/2020 and moderately increased from 10/17/2019. This may all be from degenerative disc disease and spondylosis, but if the patient has referable upper thoracic pain then thoracic spine MRI could be used for further workup. CT ABDOMEN PELVIS FINDINGS Hepatobiliary: Unremarkable. No findings of metastatic disease to the liver. Gallbladder appears normal. Pancreas: Unremarkable Spleen: Unremarkable Adrenals/Urinary Tract: Mild fullness of the left adrenal gland without discrete mass. Fluid density exophytic 2.4 by 1.9 cm lesion of the right kidney upper pole on image 15 series 7, compatible with benign cyst. Small left renal peripelvic cysts. Urinary bladder unremarkable. Stomach/Bowel: Partial right hemicolectomy. Sigmoid colon diverticulosis. No dilated bowel. Vascular/Lymphatic: Atherosclerosis is present, including aortoiliac atherosclerotic disease. No pathologic adenopathy identified. Reduced size of the previous right lower quadrant ileocecal lymph nodes, these are not considered abnormal. Reproductive: Endometrial thickness difficult to estimate but potentially up to 1.2 cm on image 93 series 6. This is similar to prior CT and ultrasound appearance. Endometrial biopsy 09/22/2020 was negative for malignancy and revealed atrophic benign endometrium and mucoinflammatory debris. Other:  No supplemental non-categorized findings. Musculoskeletal: Substantial multilevel lumbar impingement due to spondylosis and degenerative disc disease along with  scoliosis. Suspected hemangioma eccentric to the left in the L2 vertebral body. Mildly low position of the anorectal junction suggesting mild pelvic floor laxity. Levoconvex lumbar scoliosis. IMPRESSION: 1. No findings of active/recurrent malignancy. Partial right hemicolectomy. 2. Endometrial stripe remains mildly thickened, but endometrial biopsy in May was negative for malignancy. 3. Progressive endplate sclerosis and endplate irregularity at T2-3, probably due to degenerative endplate findings. If the has referable upper thoracic pain/symptoms then thoracic spine MRI could be used for further workup. 4. Other imaging findings of potential clinical significance: Mild cardiomegaly. Aortic Atherosclerosis (ICD10-I70.0). Mild mitral valve calcification. Postoperative findings in the left breast with adjacent radiation port anteriorly in the left upper lobe. Tiny pulmonary nodules in the left lower lobe are unchanged from earliest available comparison of 10/17/2019 and probably benign although may merit surveillance. Multilevel lumbar impingement. Mild pelvic floor laxity. Electronically Signed   By: Van Clines M.D.   On: 03/03/2021 08:35      No orders of the defined types were placed in this encounter.  All questions were answered. The patient knows to call the clinic with any problems, questions or concerns. No barriers to learning was detected. The total time spent in the appointment was 25 minutes.     Truitt Merle, MD 03/04/2021   I, Wilburn Mylar, am acting as scribe for Truitt Merle, MD.   I have reviewed the above documentation for accuracy and completeness, and I agree with the above.

## 2021-03-06 ENCOUNTER — Encounter: Payer: Self-pay | Admitting: Hematology

## 2021-03-08 ENCOUNTER — Other Ambulatory Visit: Payer: Self-pay | Admitting: Hematology

## 2021-03-08 DIAGNOSIS — R200 Anesthesia of skin: Secondary | ICD-10-CM

## 2021-03-09 ENCOUNTER — Telehealth: Payer: Self-pay

## 2021-03-09 NOTE — Telephone Encounter (Signed)
Refill of Lyrica faxed to pt preferred pharmacy, [facility] on [address].

## 2021-04-08 ENCOUNTER — Other Ambulatory Visit: Payer: Self-pay | Admitting: Hematology

## 2021-04-08 DIAGNOSIS — R202 Paresthesia of skin: Secondary | ICD-10-CM

## 2021-04-08 DIAGNOSIS — R200 Anesthesia of skin: Secondary | ICD-10-CM

## 2021-04-09 ENCOUNTER — Encounter: Payer: Self-pay | Admitting: Hematology

## 2021-04-15 ENCOUNTER — Other Ambulatory Visit: Payer: Self-pay

## 2021-04-15 ENCOUNTER — Inpatient Hospital Stay: Payer: Medicare Other | Attending: Hematology

## 2021-04-15 DIAGNOSIS — C182 Malignant neoplasm of ascending colon: Secondary | ICD-10-CM | POA: Diagnosis not present

## 2021-04-15 DIAGNOSIS — Z95828 Presence of other vascular implants and grafts: Secondary | ICD-10-CM

## 2021-04-15 MED ORDER — HEPARIN SOD (PORK) LOCK FLUSH 100 UNIT/ML IV SOLN
500.0000 [IU] | Freq: Once | INTRAVENOUS | Status: AC | PRN
  Administered 2021-04-15: 500 [IU]

## 2021-04-15 MED ORDER — SODIUM CHLORIDE 0.9% FLUSH
10.0000 mL | INTRAVENOUS | Status: DC | PRN
  Administered 2021-04-15: 10 mL

## 2021-05-02 DIAGNOSIS — C799 Secondary malignant neoplasm of unspecified site: Secondary | ICD-10-CM

## 2021-05-02 HISTORY — DX: Secondary malignant neoplasm of unspecified site: C79.9

## 2021-05-10 ENCOUNTER — Other Ambulatory Visit: Payer: Self-pay | Admitting: Hematology

## 2021-05-10 DIAGNOSIS — R200 Anesthesia of skin: Secondary | ICD-10-CM

## 2021-05-10 DIAGNOSIS — R202 Paresthesia of skin: Secondary | ICD-10-CM

## 2021-05-11 ENCOUNTER — Other Ambulatory Visit: Payer: Self-pay

## 2021-05-12 ENCOUNTER — Encounter: Payer: Self-pay | Admitting: Family Medicine

## 2021-05-12 ENCOUNTER — Ambulatory Visit (INDEPENDENT_AMBULATORY_CARE_PROVIDER_SITE_OTHER): Payer: Medicare Other | Admitting: Family Medicine

## 2021-05-12 VITALS — BP 126/70 | HR 70 | Temp 97.1°F | Ht 65.5 in | Wt 251.8 lb

## 2021-05-12 DIAGNOSIS — Z1159 Encounter for screening for other viral diseases: Secondary | ICD-10-CM | POA: Diagnosis not present

## 2021-05-12 DIAGNOSIS — M5441 Lumbago with sciatica, right side: Secondary | ICD-10-CM

## 2021-05-12 DIAGNOSIS — I10 Essential (primary) hypertension: Secondary | ICD-10-CM

## 2021-05-12 DIAGNOSIS — G620 Drug-induced polyneuropathy: Secondary | ICD-10-CM

## 2021-05-12 DIAGNOSIS — E785 Hyperlipidemia, unspecified: Secondary | ICD-10-CM | POA: Diagnosis not present

## 2021-05-12 DIAGNOSIS — E7849 Other hyperlipidemia: Secondary | ICD-10-CM

## 2021-05-12 DIAGNOSIS — K219 Gastro-esophageal reflux disease without esophagitis: Secondary | ICD-10-CM | POA: Diagnosis not present

## 2021-05-12 DIAGNOSIS — Z860100 Personal history of colon polyps, unspecified: Secondary | ICD-10-CM | POA: Insufficient documentation

## 2021-05-12 DIAGNOSIS — G8929 Other chronic pain: Secondary | ICD-10-CM

## 2021-05-12 DIAGNOSIS — E1169 Type 2 diabetes mellitus with other specified complication: Secondary | ICD-10-CM

## 2021-05-12 DIAGNOSIS — Z6836 Body mass index (BMI) 36.0-36.9, adult: Secondary | ICD-10-CM | POA: Insufficient documentation

## 2021-05-12 DIAGNOSIS — I700 Atherosclerosis of aorta: Secondary | ICD-10-CM | POA: Insufficient documentation

## 2021-05-12 DIAGNOSIS — E559 Vitamin D deficiency, unspecified: Secondary | ICD-10-CM | POA: Insufficient documentation

## 2021-05-12 DIAGNOSIS — Z85038 Personal history of other malignant neoplasm of large intestine: Secondary | ICD-10-CM

## 2021-05-12 DIAGNOSIS — Z96653 Presence of artificial knee joint, bilateral: Secondary | ICD-10-CM | POA: Insufficient documentation

## 2021-05-12 DIAGNOSIS — J309 Allergic rhinitis, unspecified: Secondary | ICD-10-CM | POA: Insufficient documentation

## 2021-05-12 DIAGNOSIS — Z853 Personal history of malignant neoplasm of breast: Secondary | ICD-10-CM

## 2021-05-12 DIAGNOSIS — R7301 Impaired fasting glucose: Secondary | ICD-10-CM | POA: Insufficient documentation

## 2021-05-12 DIAGNOSIS — T451X5A Adverse effect of antineoplastic and immunosuppressive drugs, initial encounter: Secondary | ICD-10-CM

## 2021-05-12 DIAGNOSIS — Z86711 Personal history of pulmonary embolism: Secondary | ICD-10-CM | POA: Insufficient documentation

## 2021-05-12 LAB — URINALYSIS, ROUTINE W REFLEX MICROSCOPIC
Bilirubin Urine: NEGATIVE
Hgb urine dipstick: NEGATIVE
Ketones, ur: NEGATIVE
Nitrite: NEGATIVE
RBC / HPF: NONE SEEN (ref 0–?)
Specific Gravity, Urine: 1.025 (ref 1.000–1.030)
Total Protein, Urine: NEGATIVE
Urine Glucose: NEGATIVE
Urobilinogen, UA: 0.2 (ref 0.0–1.0)
pH: 5.5 (ref 5.0–8.0)

## 2021-05-12 LAB — BASIC METABOLIC PANEL
BUN: 20 mg/dL (ref 6–23)
CO2: 29 mEq/L (ref 19–32)
Calcium: 9.7 mg/dL (ref 8.4–10.5)
Chloride: 104 mEq/L (ref 96–112)
Creatinine, Ser: 0.69 mg/dL (ref 0.40–1.20)
GFR: 86.29 mL/min (ref >60.00)
Glucose, Bld: 112 mg/dL — ABNORMAL HIGH (ref 70–99)
Potassium: 4.5 mEq/L (ref 3.5–5.1)
Sodium: 141 mEq/L (ref 135–145)

## 2021-05-12 LAB — LIPID PANEL
Cholesterol: 136 mg/dL (ref 0–200)
HDL: 72.9 mg/dL (ref >39.00)
LDL Cholesterol: 33 mg/dL (ref 0–99)
NonHDL: 63.13
Total CHOL/HDL Ratio: 2
Triglycerides: 151 mg/dL — ABNORMAL HIGH (ref 0.0–149.0)
VLDL: 30.2 mg/dL (ref 0.0–40.0)

## 2021-05-12 LAB — MICROALBUMIN / CREATININE URINE RATIO
Creatinine,U: 92.1 mg/dL
Microalb Creat Ratio: 0.9 mg/g (ref 0.0–30.0)
Microalb, Ur: 0.8 mg/dL (ref 0.0–1.9)

## 2021-05-12 LAB — HEMOGLOBIN A1C: Hgb A1c MFr Bld: 6.9 % — ABNORMAL HIGH (ref 4.6–6.5)

## 2021-05-12 NOTE — Progress Notes (Signed)
[facility] [address] [place] [address] Dept: [phone] Dept Fax: [phone]  New Patient Office Visit  Subjective:    Patient ID: Kim Castro, female    DOB: [birth year], 77 y.o..   MRN: [id number]  Chief Complaint  Patient presents with   Establish Care    NP- establish care.  C/o having a pain in lower back that is radiating into the Rt thigh.     History of Present Illness:  Patient is in today to establish care. Kim Castro was born in [place], [place]. She moved to [place] originally in 1978. She lived in [place] from [phone] and then returned to [place]. She has been married for 55 years. She has three children (54, 37, 65) and 5 grandchildren. She is retired, having worked in a [employer] for many years. She denies any tobacco, alcohol, or drug use.  Kim Castro has a history of stage 0 breast cancer. She underwent lumpectomy and was on Arimedex for 5 years, having recently completed that course of treatment.  Kim Castro has a history of colon cancer. She underwent a partial colectomy and chemotherapy for this. She follows with Dr. Burr Medico (hematology/oncology). She apparently did develop some neuropathy associated with her chemotherapy.  Kim Castro has a history of acid reflux, well managed on Prilosec.  Kim Castro has a history of type 2 diabetes diagnosed about 2 years ago. She is managed on metformin. She admits that she has not had an eye exam in about 1 1/2 years.   Kim Castro has a history of hypertension, managed on losartan.  Kim Castro has a history of hyperlipidemia. She is currently on pravastatin.  Kim Castro notes a history of some chronic low back pain. More recently, she has had a flare of her pain, radiating around the right hip and thigh. Tylenol does help this at times. She ahs not had any recent issues with new numbness or tingling in the lower extremities and has not had any change in bowel or bladder  function.   Past Medical History: Patient Active Problem List   Diagnosis Date Noted   Essential hypertension 05/12/2021   Vitamin D deficiency 05/12/2021   Personal history of malignant neoplasm of breast 05/12/2021   Personal history of colonic polyps 05/12/2021   Morbid obesity ([place]) 05/12/2021   Hyperlipidemia 05/12/2021   Hypercalcemia 05/12/2021   Allergic rhinitis 05/12/2021   Knee joint replacement status, bilateral 05/12/2021   History of colon cancer, stage III 02/10/2021   Gastroesophageal reflux disease    Type 2 diabetes mellitus with hyperlipidemia ([place])    History of pulmonary embolism 01/27/2020   Port-A-Cath in place 12/30/2019   Right colon cancer 11/01/2019   Genetic testing 07/08/2014   Malignant neoplasm of female breast ([place]) 06/04/2014   Past Surgical History:  Procedure Laterality Date   BREAST BIOPSY Bilateral    BREAST LUMPECTOMY Left    BREAST LUMPECTOMY WITH RADIOACTIVE SEED LOCALIZATION Left 07/01/2014   Procedure: LEFT BREAST LUMPECTOMY WITH RADIOACTIVE SEED LOCALIZATION;  Surgeon: Excell Seltzer, MD;  Location: [facility];  Service: General;  Laterality: Left;   CATARACT EXTRACTION Right    COLONOSCOPY  09/24/2019   [place]   LAPAROSCOPIC PARTIAL COLECTOMY N/A 11/01/2019   Procedure: LAPAROSCOPIC PARTIAL COLECTOMY;  Surgeon: Leighton Ruff, MD;  Location: [facility];  Service: General;  Laterality: N/A;   POLYPECTOMY     PORTACATH PLACEMENT N/A 12/10/2019   Procedure: INSERTION PORT-A-CATH ULTRASOUND GUIDED  IN RIGHT IJ;  Surgeon: Leighton Ruff, MD;  Location: [facility];  Service: General;  Laterality: N/A;   TOTAL KNEE ARTHROPLASTY Left 10/24/2012   Procedure: TOTAL KNEE ARTHROPLASTY;  Surgeon: Kerin Salen, MD;  Location: [facility];  Service: Orthopedics;  Laterality: Left;   TOTAL KNEE ARTHROPLASTY Right 01/09/2013   Procedure: TOTAL KNEE ARTHROPLASTY;  Surgeon: Kerin Salen, MD;  Location: [facility];  Service: Orthopedics;  Laterality:  Right;   TUBAL LIGATION     Family History  Problem Relation Age of Onset   Diabetes Mother    Uterine cancer Mother        deceased 73   Hypertension Father    Heart disease Father    Diabetes Sister    Breast cancer Sister 15       currently 54   Hypertension Sister    Diabetes Sister    Breast cancer Sister    Diabetes Sister    Kidney disease Sister    Heart disease Sister    Lupus Sister    Heart disease Sister    Diabetes Sister    Diabetes Sister    Hypertension Brother    Diabetes Brother    Colon cancer Brother 13   Colon polyps Daughter    Thyroid cancer Daughter 70       currently 16; type?   Cancer Maternal Uncle        unk. primary; deceased 52s   Stroke Maternal Grandmother    Esophageal cancer Neg Hx    Rectal cancer Neg Hx    Stomach cancer Neg Hx    Outpatient Medications Prior to Visit  Medication Sig Dispense Refill   acetaminophen (TYLENOL) 325 MG tablet Take 650 mg by mouth every 6 (six) hours as needed.     aspirin EC 81 MG tablet Take 81 mg by mouth daily. Swallow whole.     b complex vitamins capsule Take 1 capsule by mouth daily. Patient unsure of dose     Cholecalciferol (VITAMIN D) 2000 UNITS CAPS Take 2,000 Units by mouth daily.      lidocaine-prilocaine (EMLA) cream Apply to affected area once (Patient taking differently: Apply 1 application topically daily as needed (port access).) 30 g 3   loratadine (CLARITIN) 10 MG tablet Take 10 mg by mouth daily as needed for allergies.     losartan (COZAAR) 25 MG tablet Take 25 mg by mouth daily.     metFORMIN (GLUCOPHAGE) 500 MG tablet Take 1 tablet (500 mg total) by mouth daily with breakfast. (Patient taking differently: Take 500 mg by mouth in the morning and at bedtime.)     Multiple Vitamins-Minerals (MULTIVITAMIN WITH MINERALS) tablet Take 1 tablet by mouth daily.     omeprazole (PRILOSEC) 20 MG capsule Take 20 mg by mouth daily before breakfast.      pravastatin (PRAVACHOL) 10 MG tablet Take  10 mg by mouth daily.     pregabalin (LYRICA) 25 MG capsule Take 1 capsule by mouth once daily 30 capsule 0   ibuprofen (ADVIL) 200 MG tablet Take 400 mg by mouth every 6 (six) hours as needed for moderate pain.     No facility-administered medications prior to visit.   Allergies  Allergen Reactions   Oxycodone Nausea And Vomiting     Objective:   Today's Vitals   05/12/21 1305  BP: 126/70  Pulse: 70  Temp: (!) 97.1 F (36.2 C)  TempSrc: Temporal  SpO2: 96%  Weight: 251 lb 12.8  oz (114.2 kg)  Height: 5' 5.5" (1.664 m)   Body mass index is 41.26 kg/m.   General: Well developed, well nourished. No acute distress. Back: Straight. Tenderness noted over right lower paralumbar area. Extremities: Strength 5/5. DTR 2+. Sensation equal bilaterally. Feet- Skin intact. No sign of maceration between toes. Nails are normal. Dorsalis pedis and posterior tibial   artery pulses are normal. 5.07 monofilament testing insensate over most of the sole of both feet in a   symmetrical pattern.Marland Kitchen Psych: Alert and oriented. Normal mood and affect.  Health Maintenance Due  Topic Date Due   OPHTHALMOLOGY EXAM  Never done   Hepatitis C Screening  Never done   Zoster Vaccines- Shingrix (1 of 2) Never done   COVID-19 Vaccine (4 - Booster for Pfizer series) 03/04/2020   HEMOGLOBIN A1C  07/26/2020     Imaging: CT Chest, Abdomen, and Pelvis w contrast (03/02/2021) IMPRESSION: 1. No findings of active/recurrent malignancy. Partial right hemicolectomy. 2. Endometrial stripe remains mildly thickened, but endometrial biopsy in May was negative for malignancy. 3. Progressive endplate sclerosis and endplate irregularity at T2-3, probably due to degenerative endplate findings. If the has referable upper thoracic pain/symptoms then thoracic spine MRI could be used for further workup. 4. Other imaging findings of potential clinical significance: Mild cardiomegaly. Aortic Atherosclerosis (ICD10-I70.0). Mild  mitral valve calcification. Postoperative findings in the left breast with adjacent radiation port anteriorly in the left upper lobe. Tiny pulmonary nodules in the left lower lobe are unchanged from earliest available comparison of 10/17/2019 and probably benign although may merit surveillance. Multilevel lumbar impingement. Mild pelvic floor laxity.  Assessment & Plan:   1. Type 2 diabetes mellitus with hyperlipidemia ([place]) I will order diabetic labs to assess current status. Plan to continue metformin. I strongly urged her to follow up with her eye doctor.  - Microalbumin / creatinine urine ratio - Basic metabolic panel - Hemoglobin A1c - Urinalysis, Routine w reflex microscopic  2. Essential hypertension Blood pressure at goal. Continue losartan.  3. Gastroesophageal reflux disease, unspecified whether esophagitis present Stable on Prilosec.  4. Other hyperlipidemia Due for lipids. Continue Pravachol.  - Lipid panel  5. History of colon cancer, stage III 6. Personal history of malignant neoplasm of breast Continue to follow with Dr. Burr Medico.  7. Encounter for hepatitis C screening test for low risk patient  - HCV Ab w Reflex to Quant PCR  [name], MD

## 2021-05-13 LAB — SPECIMEN STATUS REPORT

## 2021-05-13 LAB — HCV AB W REFLEX TO QUANT PCR: HCV Ab: <0.1 s/co ratio (ref 0.0–0.9)

## 2021-05-13 LAB — HCV INTERPRETATION

## 2021-05-30 ENCOUNTER — Other Ambulatory Visit: Payer: Self-pay | Admitting: Hematology

## 2021-05-30 DIAGNOSIS — R202 Paresthesia of skin: Secondary | ICD-10-CM

## 2021-05-30 DIAGNOSIS — R200 Anesthesia of skin: Secondary | ICD-10-CM

## 2021-05-31 ENCOUNTER — Encounter: Payer: Self-pay | Admitting: Hematology

## 2021-06-03 ENCOUNTER — Inpatient Hospital Stay: Payer: Medicare Other

## 2021-06-03 ENCOUNTER — Other Ambulatory Visit: Payer: Self-pay

## 2021-06-03 ENCOUNTER — Inpatient Hospital Stay: Payer: Medicare Other | Attending: Hematology | Admitting: Hematology

## 2021-06-03 VITALS — BP 149/85 | HR 92 | Temp 98.0°F | Resp 17 | Ht 65.5 in | Wt 252.1 lb

## 2021-06-03 DIAGNOSIS — E1142 Type 2 diabetes mellitus with diabetic polyneuropathy: Secondary | ICD-10-CM | POA: Insufficient documentation

## 2021-06-03 DIAGNOSIS — Z170 Estrogen receptor positive status [ER+]: Secondary | ICD-10-CM | POA: Diagnosis not present

## 2021-06-03 DIAGNOSIS — C182 Malignant neoplasm of ascending colon: Secondary | ICD-10-CM

## 2021-06-03 DIAGNOSIS — Z1231 Encounter for screening mammogram for malignant neoplasm of breast: Secondary | ICD-10-CM | POA: Diagnosis not present

## 2021-06-03 DIAGNOSIS — Z86000 Personal history of in-situ neoplasm of breast: Secondary | ICD-10-CM | POA: Insufficient documentation

## 2021-06-03 DIAGNOSIS — C180 Malignant neoplasm of cecum: Secondary | ICD-10-CM | POA: Insufficient documentation

## 2021-06-03 DIAGNOSIS — Z803 Family history of malignant neoplasm of breast: Secondary | ICD-10-CM | POA: Insufficient documentation

## 2021-06-03 DIAGNOSIS — Z95828 Presence of other vascular implants and grafts: Secondary | ICD-10-CM

## 2021-06-03 DIAGNOSIS — G620 Drug-induced polyneuropathy: Secondary | ICD-10-CM | POA: Insufficient documentation

## 2021-06-03 DIAGNOSIS — K219 Gastro-esophageal reflux disease without esophagitis: Secondary | ICD-10-CM | POA: Insufficient documentation

## 2021-06-03 DIAGNOSIS — Z86711 Personal history of pulmonary embolism: Secondary | ICD-10-CM | POA: Diagnosis not present

## 2021-06-03 DIAGNOSIS — I10 Essential (primary) hypertension: Secondary | ICD-10-CM | POA: Diagnosis not present

## 2021-06-03 DIAGNOSIS — Z800 Family history of malignant neoplasm of digestive organs: Secondary | ICD-10-CM | POA: Insufficient documentation

## 2021-06-03 DIAGNOSIS — Z808 Family history of malignant neoplasm of other organs or systems: Secondary | ICD-10-CM | POA: Insufficient documentation

## 2021-06-03 LAB — CBC WITH DIFFERENTIAL/PLATELET
Abs Immature Granulocytes: 0.03 10*3/uL (ref 0.00–0.07)
Basophils Absolute: 0.1 10*3/uL (ref 0.0–0.1)
Basophils Relative: 1 %
Eosinophils Absolute: 0.2 10*3/uL (ref 0.0–0.5)
Eosinophils Relative: 3 %
HCT: 38.3 % (ref 36.0–46.0)
Hemoglobin: 12.6 g/dL (ref 12.0–15.0)
Immature Granulocytes: 0 %
Lymphocytes Relative: 40 %
Lymphs Abs: 3.3 10*3/uL (ref 0.7–4.0)
MCH: 32 pg (ref 26.0–34.0)
MCHC: 32.9 g/dL (ref 30.0–36.0)
MCV: 97.2 fL (ref 80.0–100.0)
Monocytes Absolute: 0.5 10*3/uL (ref 0.1–1.0)
Monocytes Relative: 7 %
Neutro Abs: 4 10*3/uL (ref 1.7–7.7)
Neutrophils Relative %: 49 %
Platelets: 259 10*3/uL (ref 150–400)
RBC: 3.94 MIL/uL (ref 3.87–5.11)
RDW: 12.8 % (ref 11.5–15.5)
WBC: 8.1 10*3/uL (ref 4.0–10.5)
nRBC: 0 % (ref 0.0–0.2)

## 2021-06-03 LAB — COMPREHENSIVE METABOLIC PANEL
ALT: 17 U/L (ref 0–44)
AST: 21 U/L (ref 15–41)
Albumin: 4.2 g/dL (ref 3.5–5.0)
Alkaline Phosphatase: 91 U/L (ref 38–126)
Anion gap: 11 (ref 5–15)
BUN: 19 mg/dL (ref 8–23)
CO2: 23 mmol/L (ref 22–32)
Calcium: 9.8 mg/dL (ref 8.9–10.3)
Chloride: 107 mmol/L (ref 98–111)
Creatinine, Ser: 0.75 mg/dL (ref 0.44–1.00)
GFR, Estimated: >60 mL/min (ref >60)
Glucose, Bld: 151 mg/dL — ABNORMAL HIGH (ref 70–99)
Potassium: 3.7 mmol/L (ref 3.5–5.1)
Sodium: 141 mmol/L (ref 135–145)
Total Bilirubin: 1.2 mg/dL (ref 0.3–1.2)
Total Protein: 7 g/dL (ref 6.5–8.1)

## 2021-06-03 MED ORDER — HEPARIN SOD (PORK) LOCK FLUSH 100 UNIT/ML IV SOLN
500.0000 [IU] | Freq: Once | INTRAVENOUS | Status: AC | PRN
  Administered 2021-06-03: 500 [IU]

## 2021-06-03 MED ORDER — LIDOCAINE-PRILOCAINE 2.5-2.5 % EX CREA: TOPICAL_CREAM | CUTANEOUS | 3 refills | Status: DC

## 2021-06-03 MED ORDER — SODIUM CHLORIDE 0.9% FLUSH
10.0000 mL | INTRAVENOUS | Status: DC | PRN
  Administered 2021-06-03: 10 mL

## 2021-06-03 NOTE — Progress Notes (Signed)
[place]   Telephone:(336) [phone] Fax:(336) [phone]   Clinic Follow up Note   Patient Care Team: [name], MD as PCP - General (Family Medicine) Rolm Bookbinder, MD as Consulting Physician (General Surgery) Truitt Merle, MD as Consulting Physician (Hematology) Leighton Ruff, MD as Consulting Physician (General Surgery) Nicholas Lose, MD as Consulting Physician (Hematology and Oncology)  Date of Service:  06/03/2021  CHIEF COMPLAINT: f/u of colon and breast cancers  CURRENT THERAPY:  Surveillance  ASSESSMENT & PLAN:  Kim Castro is a 76 y.o. female with   1. Right colon cancer, pT3N2aM0 stage IIB, MSS -She was diagnosed in 08/2019 with cecal mass biopsy showed moderately differentiated adenocarcinoma. CT chest from 10/16/19 did shows multiple small lung nodules that were nonspecific but likely benign. Will monitor.  -She underwent colon surgery with Dr Marcello Moores on 11/01/19. Path showed overall Stage IIIB cancer.  -She received 6 months of FOLFOX to reduce her risk of recurrence. She is currently on surveillance.  -surveillance colonoscopy on 02/10/21 under Dr. Carlean Purl showed only diverticulosis. -restaging CT CAP 03/02/21 was NED. There was some progressive endplate sclerosis and endplate irregularity at T2-3, probably due to degenerative endplate findings. She is mostly asymptomatic in the area, so we will defer further imaging at this time. -We reviewed recommended surveillance, with follow up every 3 months for the first 2 years, and then every 6 months to complete 5 years. We will repeat CT in 3 months, before her next visit (contrast given today). If this scan is good, we will refer her for port removal. -She is clinically doing well, lab reviewed, exam was unremarkable, there is no clinical concern for recurrence -Follow-up in 3 months   2. Neuropathy, secondary to Oxaliplatin G2 -S/p C7 she started having numbness in hands, L>R which become prolonged  with tingling s/p C10.  -Oxaliplatin dose reduced and ultimately held with C11-12.  -she is on Lyrica and B vit complex   3. H/o left breast DCIS, G2, ER/PR+ -Dx in 05/2014. Treated with left lumpectomy with Dr Excell Seltzer, adjuvant RT with Dr Pablo Ledger. She was on Anastrozole 08/2014-01/13/20. She was previously under the care of Dr Lindi Adie.  -DEXA 09/11/20 T score +1.2 normal.  -most recent mammogram on 10/02/20 was negative. -We again discussed a breast MRI for breast cancer screening; she is agreeable. We will plan for this in 04/2022.   4. Submassive bilateral pulmonary embolism, LE Edema  -S/p C3 chemo, her 01/27/20 CTA showed PE with right heart strain. She also has bilateral lower extremity edema and Doppler ultrasound was negative for DVT bilaterally.  -She was treated with 6 months of Eliquis, Lasix and oral potassium BID for LE Edema.   5. Comorbidities: Arthritis, DM, HTN, GERD  -she switched to Dr. Gena Fray for primary care in 05/2021.     PLAN:  -Port flush every 6 weeks -f/u in 3 months with lab and flush several days before -plan to remove her port after next scan if NED  -will order screening breast MRI on next visit also    No problem-specific Assessment & Plan notes found for this encounter.   SUMMARY OF ONCOLOGIC HISTORY: Oncology History Overview Note  Cancer Staging Malignant neoplasm of female breast [facility]) Staging form: Breast, AJCC 7th Edition - Clinical stage from 06/11/2014: Stage 0 (Tis (DCIS), N0, M0) - Unsigned Staged by: Pathologist and managing physician Laterality: Left Estrogen receptor status: Positive Progesterone receptor status: Positive Stage used in treatment planning: Yes National guidelines used  in treatment planning: Yes Type of national guideline used in treatment planning: NCCN - Pathologic stage from 07/03/2014: Stage Unknown (Tis (DCIS), NX, cM0) - Signed by Enid Cutter, MD on 07/10/2014 Staged by: Pathologist Laterality: Left Estrogen receptor  status: Positive Progesterone receptor status: Positive Stage used in treatment planning: Yes National guidelines used in treatment planning: Yes Type of national guideline used in treatment planning: NCCN Staging comments: Staged on final lumpectomy specimen by Dr. Donato Heinz.  right colon cancer Staging form: Colon and Rectum, AJCC 8th Edition - Pathologic stage from 11/01/2019: Stage IIIB (pT3, pN2a, cM0) - Signed by Truitt Merle, MD on 11/29/2019 Stage prefix: Initial diagnosis Histologic grading system: 4 grade system Histologic grade (G): G2 Residual tumor (R): R0 - None Tumor deposits (TD): Absent Perineural invasion (PNI): Absent Microsatellite instability (MSI): Stable KRAS mutation: Unknown NRAS mutation: Unknown BRAF Mutation: Unknown    Malignant neoplasm of female breast ([place])  05/29/2014 Initial Biopsy   Left breast needle core biopsy: Grade 2, DCIS with calcs. ER+ (100%), PR+ (96%).    06/04/2014 Initial Diagnosis   Left breast DCIS with calcifications, ER 100%, PR 96%   06/10/2014 Breast MRI   Left breast: 2.4 x 1.3 x 1.1 cm area of patchy non-mass enhancement upper outer quadrant includes postbiopsy seroma; Right breast: 1.2 cm previously biopsied stable benign fibroadenoma   06/12/2014 Procedure   Genetic counseling/testing: Identified 1 VUS on CHEK2 gene. Remainder of 17 gene panel tested negative and included: ATM, BARD1, BRCA 1/2, BRIP1, CDH1, CHEK2, EPCAM, MLH1, MSH2, MSH6, NBN, NF1, PALB2, PTEN, RAD50, RAD51C, RAD51D, STK11, and TP53.    07/01/2014 Surgery   Left breast lumpectomy ([facility]): Grade 1, DCIS, spanning 2.3 cm, 1 mm margin, ER 100%, PR 96%   07/31/2014 - 08/28/2014 Radiation Therapy   Adjuvant RT completed Pablo Ledger). Left breast: Total dose 42.5 Gy over 17 fractions. Left breast boost: Total dose 7.5 Gy over 3 fractions.    09/14/2014 - 01/13/2020 Anti-estrogen oral therapy   Anastrazole $RemoveBefo'1mg'WbieAOcsDQt$  daily. Planned duration of treatment: 5 years [place]). Completed in  01/2020.    09/25/2014 Survivorship   Survivorship Care Plan given to patient and reviewed with her in person.    03/02/2021 Imaging   CT CAP  IMPRESSION: 1. No findings of active/recurrent malignancy. Partial right hemicolectomy. 2. Endometrial stripe remains mildly thickened, but endometrial biopsy in May was negative for malignancy. 3. Progressive endplate sclerosis and endplate irregularity at T2-3, probably due to degenerative endplate findings. If the has referable upper thoracic pain/symptoms then thoracic spine MRI could be used for further workup. 4. Other imaging findings of potential clinical significance: Mild cardiomegaly. Aortic Atherosclerosis (ICD10-I70.0). Mild mitral valve calcification. Postoperative findings in the left breast with adjacent radiation port anteriorly in the left upper lobe. Tiny pulmonary nodules in the left lower lobe are unchanged from earliest available comparison of 10/17/2019 and probably benign although may merit surveillance. Multilevel lumbar impingement. Mild pelvic floor laxity.   Right colon cancer  09/19/2019 Imaging   CT AP W contrast 09/19/19  IMPRESSION Fullness in the cecum, cannot exclude a mass. No evidence for metastatic disease is identified.    09/24/2019 Procedure   Colonoscopy by Dr Eber Jones 09/24/19 IMPRESSION 1. The colon was redundant  2. Mild diverticulosis was noted through the entire examined colon 3. Single 25mm polyp was found in the ascending colon; polypectomy was performed using snare cautery and biopsy forceps 4. Mild diverticulosis was notes in the descending colon and sigmoid colon.  5. Single polyp  was found in the sigmoid colon, polypectomy was performed with cold forceps.  6. Single polyp was found in the rectosigmoid colon; polypectomy was performed with cold snare  7. Small internal hemorrhoids  8. Large mass was found at the cecum; multiple biopsies of the area were performed using cold forceps; injection  (tattooing) was performed distal to the mass.    09/24/2019 Initial Biopsy   INTERPRETATION AND DIAGNOSIS:  A. Cecum, biopsy:  Invasive moderately differentiated adenocarcinoma.  see comment  B. Polyp @ ascending colon, polypectomy:  Tubular Adenoma  C. Polyp @ sigmoid colon Polypectomy:  hyperplastic polyp.  D. Polyp @ rectosigmoid colon, Polypectomy:  Hyperplastic Polyp      10/16/2019 Imaging   CT Chest IMPRESSION: 1. Multiple small pulmonary nodules measuring 5 mm or less in size in the lungs. These are nonspecific and are typically considered statistically likely benign. However, given the patient's history of primary malignancy, close attention on follow-up studies is recommended to ensure stability. 2. Aortic atherosclerosis, in addition to right coronary artery disease. Assessment for potential risk factor modification, dietary therapy or pharmacologic therapy may be warranted, if clinically indicated. 3. There are calcifications of the aortic valve and mitral annulus. Echocardiographic correlation for evaluation of potential valvular dysfunction may be warranted if clinically indicated. 4. Small hiatal hernia.   Aortic Atherosclerosis (ICD10-I70.0).   11/01/2019 Initial Diagnosis   Colon cancer ([place])   11/01/2019 Surgery   LAPAROSCOPIC PARTIAL COLECTOMY by Dr Marcello Moores and Dr Johney Maine   11/01/2019 Pathology Results   FINAL MICROSCOPIC DIAGNOSIS:   A. COLON, PROXIMAL RIGHT, COLECTOMY:  - Invasive colonic adenocarcinoma, 5 cm.  - Tumor invades through the muscularis propria into pericolonic tissues.   - Margins of resection are not involved.  - Metastatic carcinoma in (5) of (13) lymph nodes.  - See oncology table.    MSI Stable  Mismatch repair normal  MLH1 - Preserved nuclear expression (greater 50% tumor expression) MSH2 - Preserved nuclear expression (greater 50% tumor expression) MSH6 - Preserved nuclear expression (greater 50% tumor expression) PMS2 -  Preserved nuclear expression (greater 50% tumor expression)   11/01/2019 Cancer Staging   Staging form: Colon and Rectum, AJCC 8th Edition - Pathologic stage from 11/01/2019: Stage IIIB (pT3, pN2a, cM0) - Signed by Truitt Merle, MD on 11/29/2019    12/10/2019 Procedure   PAC placed 12/10/19   12/17/2019 - 06/01/2020 Chemotherapy   FOLFOX q2weeks starting in 2 weeks starting 12/17/19. Held 01/27/20-02/10/20 due to b/l PE. Oxaliplatin held C11-12 due to neuropathy. Completed on 06/01/20   03/02/2021 Imaging   CT CAP  IMPRESSION: 1. No findings of active/recurrent malignancy. Partial right hemicolectomy. 2. Endometrial stripe remains mildly thickened, but endometrial biopsy in May was negative for malignancy. 3. Progressive endplate sclerosis and endplate irregularity at T2-3, probably due to degenerative endplate findings. If the has referable upper thoracic pain/symptoms then thoracic spine MRI could be used for further workup. 4. Other imaging findings of potential clinical significance: Mild cardiomegaly. Aortic Atherosclerosis (ICD10-I70.0). Mild mitral valve calcification. Postoperative findings in the left breast with adjacent radiation port anteriorly in the left upper lobe. Tiny pulmonary nodules in the left lower lobe are unchanged from earliest available comparison of 10/17/2019 and probably benign although may merit surveillance. Multilevel lumbar impingement. Mild pelvic floor laxity.      INTERVAL HISTORY:  Kim Castro is here for a follow up of colon and breast cancers. She was last seen by me on 03/04/21. She presents to the clinic alone.  She reports she is doing well overall. She notes her bowel movements are stable ("same as they've been"). She reports some aches and pain, which she associated with age.   All other systems were reviewed with the patient and are negative.  MEDICAL HISTORY:  Past Medical History:  Diagnosis Date   Aortic atherosclerosis (HCC)     Arthritis    feet, lower back   Basal cell carcinoma    arm   Breast cancer of upper-outer quadrant of left female breast (HCC) 06/04/2014   Cataract    immature on the left   Colon cancer (HCC) 08/2019   Diabetes mellitus without complication (HCC)    Diverticulosis    Dizziness    > 74yrs ago;took Antivert    Family history of anesthesia complication    sister slow to wake up with anesthesia   Family history of breast cancer    Family history of colon cancer    Family history of uterine cancer    GERD (gastroesophageal reflux disease)    takes occasional TUMs   History of bronchitis    > 34yrs ago   History of colon polyps    History of hiatal hernia    Small noted on CT   History of pulmonary embolus (PE)    Hypertension    takes Losartan daily and HCTZ   Iron deficiency anemia    Joint pain    Numbness    to toes on each foot   Peripheral neuropathy    feet and toes   Personal history of radiation therapy    Pulmonary nodules    Noted on CT   Radiation 07/31/14-08/28/14   Left Breast 20 fxs   Seasonal allergies    takes Claritin prn   Urinary frequency    Vitamin D deficiency    takes VIt D daily    SURGICAL HISTORY: Past Surgical History:  Procedure Laterality Date   BREAST BIOPSY Bilateral    BREAST LUMPECTOMY Left    BREAST LUMPECTOMY WITH RADIOACTIVE SEED LOCALIZATION Left 07/01/2014   Procedure: LEFT BREAST LUMPECTOMY WITH RADIOACTIVE SEED LOCALIZATION;  Surgeon: Glenna Fellows, MD;  Location: [facility];  Service: General;  Laterality: Left;   CATARACT EXTRACTION Right    COLONOSCOPY  09/24/2019   [place]   LAPAROSCOPIC PARTIAL COLECTOMY N/A 11/01/2019   Procedure: LAPAROSCOPIC PARTIAL COLECTOMY;  Surgeon: [name], MD;  Location: [facility];  Service: General;  Laterality: N/A;   POLYPECTOMY     PORTACATH PLACEMENT N/A 12/10/2019   Procedure: INSERTION PORT-A-CATH ULTRASOUND GUIDED IN RIGHT IJ;  Surgeon: [name], MD;   Location: [facility];  Service: General;  Laterality: N/A;   TOTAL KNEE ARTHROPLASTY Left 10/24/2012   Procedure: TOTAL KNEE ARTHROPLASTY;  Surgeon: Nestor Lewandowsky, MD;  Location: [facility];  Service: Orthopedics;  Laterality: Left;   TOTAL KNEE ARTHROPLASTY Right 01/09/2013   Procedure: TOTAL KNEE ARTHROPLASTY;  Surgeon: Nestor Lewandowsky, MD;  Location: [facility];  Service: Orthopedics;  Laterality: Right;   TUBAL LIGATION      I have reviewed the social history and family history with the patient and they are unchanged from previous note.  ALLERGIES:  is allergic to oxycodone.  MEDICATIONS:  Current Outpatient Medications  Medication Sig Dispense Refill   acetaminophen (TYLENOL) 325 MG tablet Take 650 mg by mouth every 6 (six) hours as needed.     aspirin EC 81 MG tablet Take 81 mg by mouth daily. Swallow whole.  b complex vitamins capsule Take 1 capsule by mouth daily. Patient unsure of dose     Cholecalciferol (VITAMIN D) 2000 UNITS CAPS Take 2,000 Units by mouth daily.      loratadine (CLARITIN) 10 MG tablet Take 10 mg by mouth daily as needed for allergies.     losartan (COZAAR) 25 MG tablet Take 25 mg by mouth daily.     metFORMIN (GLUCOPHAGE) 500 MG tablet Take 1 tablet (500 mg total) by mouth daily with breakfast. (Patient taking differently: Take 500 mg by mouth in the morning and at bedtime.)     Multiple Vitamins-Minerals (MULTIVITAMIN WITH MINERALS) tablet Take 1 tablet by mouth daily.     omeprazole (PRILOSEC) 20 MG capsule Take 20 mg by mouth daily before breakfast.      pravastatin (PRAVACHOL) 10 MG tablet Take 10 mg by mouth daily.     pregabalin (LYRICA) 25 MG capsule Take 1 capsule by mouth once daily 30 capsule 0   lidocaine-prilocaine (EMLA) cream Apply to affected area once 30 g 3   No current facility-administered medications for this visit.    PHYSICAL EXAMINATION: ECOG PERFORMANCE STATUS: 0 - Asymptomatic  Vitals:   06/03/21 0756  BP: (!) 149/85  Pulse: 92  Resp: 17   Temp: 98 F (36.7 C)  SpO2: 95%   Wt Readings from Last 3 Encounters:  06/03/21 252 lb 1.6 oz (114.4 kg)  05/12/21 251 lb 12.8 oz (114.2 kg)  03/04/21 249 lb 14.4 oz (113.4 kg)     GENERAL:alert, no distress and comfortable SKIN: skin color, texture, turgor are normal, no rashes or significant lesions EYES: normal, Conjunctiva are pink and non-injected, sclera clear  NECK: supple, thyroid normal size, non-tender, without nodularity LYMPH:  no palpable lymphadenopathy in the cervical, axillary  LUNGS: clear to auscultation and percussion with normal breathing effort HEART: regular rate & rhythm and no murmurs, (+) stable lower extremity edema ABDOMEN:abdomen soft, non-tender and normal bowel sounds Musculoskeletal:no cyanosis of digits and no clubbing  NEURO: alert & oriented x 3 with fluent speech, no focal motor/sensory deficits  LABORATORY DATA:  I have reviewed the data as listed CBC Latest Ref Rng & Units 06/03/2021 03/02/2021 11/30/2020  WBC 4.0 - 10.5 K/uL 8.1 6.1 6.2  Hemoglobin 12.0 - 15.0 g/dL 12.6 12.2 12.0  Hematocrit 36.0 - 46.0 % 38.3 36.1 36.5  Platelets 150 - 400 K/uL 259 257 228     CMP Latest Ref Rng & Units 06/03/2021 05/12/2021 03/02/2021  Glucose 70 - 99 mg/dL 151(H) 112(H) 127(H)  BUN 8 - 23 mg/dL $Remove'19 20 19  'YeclqJP$ Creatinine 0.44 - 1.00 mg/dL 0.75 0.69 0.73  Sodium 135 - 145 mmol/L 141 141 140  Potassium 3.5 - 5.1 mmol/L 3.7 4.5 4.1  Chloride 98 - 111 mmol/L 107 104 107  CO2 22 - 32 mmol/L $RemoveB'23 29 23  'rlpbxWQP$ Calcium 8.9 - 10.3 mg/dL 9.8 9.7 9.0  Total Protein 6.5 - 8.1 g/dL 7.0 - 7.0  Total Bilirubin 0.3 - 1.2 mg/dL 1.2 - 0.9  Alkaline Phos 38 - 126 U/L 91 - 85  AST 15 - 41 U/L 21 - 26  ALT 0 - 44 U/L 17 - 22      RADIOGRAPHIC STUDIES: I have personally reviewed the radiological images as listed and agreed with the findings in the report. No results found.    Orders Placed This Encounter  Procedures   MM Digital Screening    Standing Status:   Future  Standing Expiration Date:   06/03/2022    Order Specific Question:   Reason for Exam (SYMPTOM  OR DIAGNOSIS REQUIRED)    Answer:   screening    Order Specific Question:   Preferred imaging location?    Answer:   [facility]   CT CHEST ABDOMEN PELVIS W CONTRAST    Standing Status:   Future    Standing Expiration Date:   06/03/2022    Order Specific Question:   Preferred imaging location?    Answer:   [facility]    Order Specific Question:   Is Oral Contrast requested for this exam?    Answer:   Yes, Per Radiology protocol   All questions were answered. The patient knows to call the clinic with any problems, questions or concerns. No barriers to learning was detected. The total time spent in the appointment was 30 minutes.     Truitt Merle, MD 06/03/2021   I, Wilburn Mylar, am acting as scribe for Truitt Merle, MD.   I have reviewed the above documentation for accuracy and completeness, and I agree with the above.

## 2021-06-04 ENCOUNTER — Telehealth: Payer: Self-pay

## 2021-06-04 NOTE — Telephone Encounter (Signed)
Patient notified of prior authorization approval for Lidocaine-Prilocaine 2.5% Cream. Medication is authorized through 06/04/2022. Patient's Pharmacy notified. Provider aware. No other needs verbalized at this time.

## 2021-07-05 ENCOUNTER — Other Ambulatory Visit: Payer: Self-pay | Admitting: Hematology

## 2021-07-05 DIAGNOSIS — R200 Anesthesia of skin: Secondary | ICD-10-CM

## 2021-07-15 ENCOUNTER — Inpatient Hospital Stay: Payer: Medicare Other | Attending: Hematology

## 2021-07-15 ENCOUNTER — Other Ambulatory Visit: Payer: Self-pay

## 2021-07-15 DIAGNOSIS — Z853 Personal history of malignant neoplasm of breast: Secondary | ICD-10-CM | POA: Insufficient documentation

## 2021-07-15 DIAGNOSIS — C182 Malignant neoplasm of ascending colon: Secondary | ICD-10-CM | POA: Insufficient documentation

## 2021-07-15 MED ORDER — SODIUM CHLORIDE 0.9% FLUSH
10.0000 mL | INTRAVENOUS | Status: DC | PRN
  Administered 2021-07-15: 10 mL

## 2021-07-15 MED ORDER — HEPARIN SOD (PORK) LOCK FLUSH 100 UNIT/ML IV SOLN
500.0000 [IU] | Freq: Once | INTRAVENOUS | Status: AC | PRN
  Administered 2021-07-15: 500 [IU]

## 2021-07-28 ENCOUNTER — Ambulatory Visit: Payer: Medicare Other

## 2021-07-28 ENCOUNTER — Telehealth: Payer: Self-pay

## 2021-07-28 NOTE — Telephone Encounter (Signed)
Called patient x 3 with no answer, Patient may reschedule for next available appointment . ? ?L.Lilyanna Lunt,LPN  ?

## 2021-08-03 ENCOUNTER — Ambulatory Visit: Payer: Medicare Other

## 2021-08-09 ENCOUNTER — Other Ambulatory Visit: Payer: Self-pay | Admitting: Hematology

## 2021-08-09 DIAGNOSIS — R202 Paresthesia of skin: Secondary | ICD-10-CM

## 2021-08-10 ENCOUNTER — Ambulatory Visit (INDEPENDENT_AMBULATORY_CARE_PROVIDER_SITE_OTHER): Payer: Medicare Other | Admitting: Family Medicine

## 2021-08-10 VITALS — BP 124/70 | HR 76 | Temp 98.0°F | Ht 65.5 in | Wt 253.2 lb

## 2021-08-10 DIAGNOSIS — I10 Essential (primary) hypertension: Secondary | ICD-10-CM

## 2021-08-10 DIAGNOSIS — E785 Hyperlipidemia, unspecified: Secondary | ICD-10-CM

## 2021-08-10 DIAGNOSIS — E1169 Type 2 diabetes mellitus with other specified complication: Secondary | ICD-10-CM

## 2021-08-10 DIAGNOSIS — E7849 Other hyperlipidemia: Secondary | ICD-10-CM

## 2021-08-10 LAB — GLUCOSE, RANDOM: Glucose, Bld: 112 mg/dL — ABNORMAL HIGH (ref 70–99)

## 2021-08-10 LAB — HEMOGLOBIN A1C: Hgb A1c MFr Bld: 6.9 % — ABNORMAL HIGH (ref 4.6–6.5)

## 2021-08-10 NOTE — Progress Notes (Signed)
?[facility] ?[place] ?[place] [address] ?Dept: [phone] ?Dept Fax: [phone] ? ?Chronic Care Office Visit ? ?Subjective:  ? ? Patient ID: Kim Castro, female    DOB: 06-02-1945, 76 y.o..   MRN: [id number] ? ?Chief Complaint  ?Patient presents with  ? Follow-up  ?  3 month f/u DM.    ? ? ?History of Present Illness: ? ?Patient is in today for reassessment of chronic medical issues. ? ?Kim Castro has a history of type 2 diabetes. She is managed on metformin. She notes she is scheduled for her eye exam in June. ?  ?Kim Castro has a history of hypertension, managed on losartan. ?  ?Kim Castro has a history of hyperlipidemia. She is currently on pravastatin. ? ?Past Medical History: ?Patient Active Problem List  ? Diagnosis Date Noted  ? Essential hypertension 05/12/2021  ? Vitamin D deficiency 05/12/2021  ? Personal history of malignant neoplasm of breast 05/12/2021  ? Personal history of colonic polyps 05/12/2021  ? Morbid obesity ([place]) 05/12/2021  ? Hyperlipidemia 05/12/2021  ? Hypercalcemia 05/12/2021  ? Allergic rhinitis 05/12/2021  ? Knee joint replacement status, bilateral 05/12/2021  ? Neuropathy, secondary to Oxaliplatin G2 05/12/2021  ? Aortic atherosclerosis ([place]) 05/12/2021  ? History of colon cancer, stage III 02/10/2021  ? Gastroesophageal reflux disease   ? Type 2 diabetes mellitus with hyperlipidemia ([place])   ? History of pulmonary embolism 01/27/2020  ? Port-A-Cath in place 12/30/2019  ? Right colon cancer 11/01/2019  ? Genetic testing 07/08/2014  ? Malignant neoplasm of female breast ([place]) 06/04/2014  ? ?Past Surgical History:  ?Procedure Laterality Date  ? BREAST BIOPSY Bilateral   ? BREAST LUMPECTOMY Left   ? BREAST LUMPECTOMY WITH RADIOACTIVE SEED LOCALIZATION Left 07/01/2014  ? Procedure: LEFT BREAST LUMPECTOMY WITH RADIOACTIVE SEED LOCALIZATION;  Surgeon: Excell Seltzer, Castro;  Location: [facility];  Service:  General;  Laterality: Left;  ? CATARACT EXTRACTION Right   ? COLONOSCOPY  09/24/2019  ? Kim Castro  ? LAPAROSCOPIC PARTIAL COLECTOMY N/A 11/01/2019  ? Procedure: LAPAROSCOPIC PARTIAL COLECTOMY;  Surgeon: Kim Castro;  [facility];  Service: General;  Laterality: N/A;  ? POLYPECTOMY    ? PORTACATH PLACEMENT N/A 12/10/2019  ? Procedure: INSERTION PORT-A-CATH ULTRASOUND GUIDED IN RIGHT IJ;  Surgeon: Kim Castro;  Location: [facility];  Service: General;  Laterality: N/A;  ? TOTAL KNEE ARTHROPLASTY Left 10/24/2012  ? Procedure: TOTAL KNEE ARTHROPLASTY;  Surgeon: Kim Castro;  Location: [facility];  Service: Orthopedics;  Laterality: Left;  ? TOTAL KNEE ARTHROPLASTY Right 01/09/2013  ? Procedure: TOTAL KNEE ARTHROPLASTY;  Surgeon: Kim Castro;  Location: [facility];  Service: Orthopedics;  Laterality: Right;  ? TUBAL LIGATION    ? ?Family History  ?Problem Relation Age of Onset  ? Diabetes Mother   ? Uterine cancer Mother   ?     deceased 49  ? Hypertension Father   ? Heart disease Father   ? Diabetes Sister   ? Breast cancer Sister 44  ?     currently 55  ? Hypertension Sister   ? Diabetes Sister   ? Breast cancer Sister   ? Diabetes Sister   ? Kidney disease Sister   ? Heart disease Sister   ? Lupus Sister   ? Heart disease Sister   ? Diabetes Sister   ? Diabetes Sister   ? Hypertension Brother   ? Diabetes Brother   ?  Colon cancer Brother 35  ? Colon polyps Daughter   ? Thyroid cancer Daughter 70  ?     currently 20; type?  ? Cancer Maternal Uncle   ?     unk. primary; deceased 70s  ? Stroke Maternal Grandmother   ? Esophageal cancer Neg Hx   ? Rectal cancer Neg Hx   ? Stomach cancer Neg Hx   ? ?Outpatient Medications Prior to Visit  ?Medication Sig Dispense Refill  ? acetaminophen (TYLENOL) 325 MG tablet Take 650 mg by mouth every 6 (six) hours as needed.    ? aspirin EC 81 MG tablet Take 81 mg by mouth daily. Swallow whole.    ? b complex vitamins capsule Take 1 capsule by mouth daily. Patient unsure  of dose    ? Cholecalciferol (VITAMIN D) 2000 UNITS CAPS Take 2,000 Units by mouth daily.     ? lidocaine-prilocaine (EMLA) cream Apply to affected area once 30 g 3  ? loratadine (CLARITIN) 10 MG tablet Take 10 mg by mouth daily as needed for allergies.    ? losartan (COZAAR) 25 MG tablet Take 25 mg by mouth daily.    ? metFORMIN (GLUCOPHAGE) 500 MG tablet Take 1 tablet (500 mg total) by mouth daily with breakfast. (Patient taking differently: Take 500 mg by mouth in the morning and at bedtime.)    ? Multiple Vitamins-Minerals (MULTIVITAMIN WITH MINERALS) tablet Take 1 tablet by mouth daily.    ? omeprazole (PRILOSEC) 20 MG capsule Take 20 mg by mouth daily before breakfast.     ? pravastatin (PRAVACHOL) 10 MG tablet Take 10 mg by mouth daily.    ? pregabalin (LYRICA) 25 MG capsule Take 1 capsule by mouth once daily 30 capsule 0  ? ?No facility-administered medications prior to visit.  ? ?Allergies  ?Allergen Reactions  ? Oxycodone Nausea And Vomiting  ?  ?Objective:  ? ?Today's Vitals  ? 08/10/21 1338  ?BP: 124/70  ?Pulse: 76  ?Temp: 98 ?F (36.7 ?C)  ?TempSrc: Temporal  ?SpO2: 95%  ?Weight: 253 lb 3.2 oz (114.9 kg)  ?Height: 5' 5.5" (1.664 m)  ? ?Body mass index is 41.49 kg/m?.  ? ?General: Well developed, well nourished. No acute distress. ?Psych: Alert and oriented. Normal mood and affect. ? ?Health Maintenance Due  ?Topic Date Due  ? OPHTHALMOLOGY EXAM  Never done  ? Zoster Vaccines- Shingrix (1 of 2) Never done  ?   ?Assessment & Plan:  ? ?1. Type 2 diabetes mellitus with hyperlipidemia ([place]) ?Diabetes has been well controlled on metformin 500 mg bid. UTD on screenings except for eye exam (pending in June). ? ?- Glucose, random ?- Hemoglobin A1c ? ?2. Essential hypertension ?Blood pressure is at goal. Continue losartan 25 mg daily. ? ?3. Other hyperlipidemia ?Lipids have been at goal. Continue pravastatin 20 mg daily. ? ?Return in about 3 months (around 11/09/2021) for Reassessment.  ? ?Kim Castro ?

## 2021-08-14 ENCOUNTER — Other Ambulatory Visit: Payer: Self-pay | Admitting: Hematology

## 2021-08-14 DIAGNOSIS — R200 Anesthesia of skin: Secondary | ICD-10-CM

## 2021-08-20 ENCOUNTER — Telehealth: Payer: Self-pay | Admitting: Family Medicine

## 2021-08-20 NOTE — Telephone Encounter (Signed)
Left message for patient to call back and schedule Medicare Annual Wellness Visit (AWV). Please offer to do virtually or by telephone.  Left office number and my jabber #[phone]. ? ?Due for AWVI ? ?Please schedule at anytime with Nurse Health Advisor. ?  ?

## 2021-08-26 ENCOUNTER — Ambulatory Visit (HOSPITAL_COMMUNITY)
Admission: RE | Admit: 2021-08-26 | Discharge: 2021-08-26 | Disposition: A | Discharge status: Home / Self Care | Payer: Medicare Other | Source: Ambulatory Visit | Attending: Hematology | Admitting: Hematology

## 2021-08-26 ENCOUNTER — Inpatient Hospital Stay: Payer: Medicare Other | Attending: Hematology

## 2021-08-26 ENCOUNTER — Other Ambulatory Visit: Payer: Self-pay

## 2021-08-26 DIAGNOSIS — C182 Malignant neoplasm of ascending colon: Secondary | ICD-10-CM

## 2021-08-26 DIAGNOSIS — R160 Hepatomegaly, not elsewhere classified: Secondary | ICD-10-CM | POA: Diagnosis not present

## 2021-08-26 DIAGNOSIS — N281 Cyst of kidney, acquired: Secondary | ICD-10-CM | POA: Diagnosis not present

## 2021-08-26 DIAGNOSIS — Z85038 Personal history of other malignant neoplasm of large intestine: Secondary | ICD-10-CM | POA: Diagnosis not present

## 2021-08-26 DIAGNOSIS — K5730 Diverticulosis of large intestine without perforation or abscess without bleeding: Secondary | ICD-10-CM | POA: Diagnosis not present

## 2021-08-26 DIAGNOSIS — K5790 Diverticulosis of intestine, part unspecified, without perforation or abscess without bleeding: Secondary | ICD-10-CM | POA: Diagnosis not present

## 2021-08-26 DIAGNOSIS — Z95828 Presence of other vascular implants and grafts: Secondary | ICD-10-CM

## 2021-08-26 DIAGNOSIS — E041 Nontoxic single thyroid nodule: Secondary | ICD-10-CM | POA: Diagnosis not present

## 2021-08-26 DIAGNOSIS — K760 Fatty (change of) liver, not elsewhere classified: Secondary | ICD-10-CM | POA: Insufficient documentation

## 2021-08-26 LAB — COMPREHENSIVE METABOLIC PANEL
ALT: 16 U/L (ref 0–44)
AST: 18 U/L (ref 15–41)
Albumin: 4.2 g/dL (ref 3.5–5.0)
Alkaline Phosphatase: 69 U/L (ref 38–126)
Anion gap: 8 (ref 5–15)
BUN: 22 mg/dL (ref 8–23)
CO2: 24 mmol/L (ref 22–32)
Calcium: 9.4 mg/dL (ref 8.9–10.3)
Chloride: 107 mmol/L (ref 98–111)
Creatinine, Ser: 0.72 mg/dL (ref 0.44–1.00)
GFR, Estimated: >60 mL/min (ref >60)
Glucose, Bld: 139 mg/dL — ABNORMAL HIGH (ref 70–99)
Potassium: 4.1 mmol/L (ref 3.5–5.1)
Sodium: 139 mmol/L (ref 135–145)
Total Bilirubin: 1.1 mg/dL (ref 0.3–1.2)
Total Protein: 6.7 g/dL (ref 6.5–8.1)

## 2021-08-26 LAB — CBC WITH DIFFERENTIAL/PLATELET
Abs Immature Granulocytes: 0.02 10*3/uL (ref 0.00–0.07)
Basophils Absolute: 0.1 10*3/uL (ref 0.0–0.1)
Basophils Relative: 1 %
Eosinophils Absolute: 0.3 10*3/uL (ref 0.0–0.5)
Eosinophils Relative: 4 %
HCT: 36.7 % (ref 36.0–46.0)
Hemoglobin: 11.9 g/dL — ABNORMAL LOW (ref 12.0–15.0)
Immature Granulocytes: 0 %
Lymphocytes Relative: 36 %
Lymphs Abs: 2.4 10*3/uL (ref 0.7–4.0)
MCH: 31.8 pg (ref 26.0–34.0)
MCHC: 32.4 g/dL (ref 30.0–36.0)
MCV: 98.1 fL (ref 80.0–100.0)
Monocytes Absolute: 0.6 10*3/uL (ref 0.1–1.0)
Monocytes Relative: 9 %
Neutro Abs: 3.4 10*3/uL (ref 1.7–7.7)
Neutrophils Relative %: 50 %
Platelets: 247 10*3/uL (ref 150–400)
RBC: 3.74 MIL/uL — ABNORMAL LOW (ref 3.87–5.11)
RDW: 12.9 % (ref 11.5–15.5)
WBC: 6.8 10*3/uL (ref 4.0–10.5)
nRBC: 0 % (ref 0.0–0.2)

## 2021-08-26 LAB — CEA (IN HOUSE-CHCC): CEA (CHCC-In House): 6.99 ng/mL — ABNORMAL HIGH (ref 0.00–5.00)

## 2021-08-26 MED ORDER — HEPARIN SOD (PORK) LOCK FLUSH 100 UNIT/ML IV SOLN
500.0000 [IU] | Freq: Once | INTRAVENOUS | Status: AC
  Administered 2021-08-26: 500 [IU] via INTRAVENOUS

## 2021-08-26 MED ORDER — SODIUM CHLORIDE 0.9% FLUSH
10.0000 mL | INTRAVENOUS | Status: DC | PRN
  Administered 2021-08-26: 10 mL

## 2021-08-26 MED ORDER — IOHEXOL 300 MG/ML  SOLN
100.0000 mL | Freq: Once | INTRAMUSCULAR | Status: AC | PRN
  Administered 2021-08-26: 100 mL via INTRAVENOUS

## 2021-08-26 MED ORDER — HEPARIN SOD (PORK) LOCK FLUSH 100 UNIT/ML IV SOLN
INTRAVENOUS | Status: AC
  Filled 2021-08-26: qty 5

## 2021-08-30 ENCOUNTER — Other Ambulatory Visit: Payer: Self-pay

## 2021-08-30 ENCOUNTER — Inpatient Hospital Stay: Payer: Medicare Other | Attending: Hematology | Admitting: Hematology

## 2021-08-30 ENCOUNTER — Encounter: Payer: Self-pay | Admitting: Hematology

## 2021-08-30 VITALS — BP 144/80 | HR 71 | Temp 98.2°F | Resp 18 | Ht 65.5 in | Wt 251.6 lb

## 2021-08-30 DIAGNOSIS — Z86711 Personal history of pulmonary embolism: Secondary | ICD-10-CM | POA: Insufficient documentation

## 2021-08-30 DIAGNOSIS — Z85038 Personal history of other malignant neoplasm of large intestine: Secondary | ICD-10-CM | POA: Diagnosis not present

## 2021-08-30 DIAGNOSIS — Z170 Estrogen receptor positive status [ER+]: Secondary | ICD-10-CM | POA: Diagnosis not present

## 2021-08-30 DIAGNOSIS — Z800 Family history of malignant neoplasm of digestive organs: Secondary | ICD-10-CM | POA: Insufficient documentation

## 2021-08-30 DIAGNOSIS — Z86000 Personal history of in-situ neoplasm of breast: Secondary | ICD-10-CM | POA: Insufficient documentation

## 2021-08-30 DIAGNOSIS — C50919 Malignant neoplasm of unspecified site of unspecified female breast: Secondary | ICD-10-CM

## 2021-08-30 DIAGNOSIS — R200 Anesthesia of skin: Secondary | ICD-10-CM | POA: Diagnosis not present

## 2021-08-30 DIAGNOSIS — Z803 Family history of malignant neoplasm of breast: Secondary | ICD-10-CM | POA: Diagnosis not present

## 2021-08-30 DIAGNOSIS — G620 Drug-induced polyneuropathy: Secondary | ICD-10-CM | POA: Insufficient documentation

## 2021-08-30 DIAGNOSIS — Z808 Family history of malignant neoplasm of other organs or systems: Secondary | ICD-10-CM | POA: Diagnosis not present

## 2021-08-30 DIAGNOSIS — R202 Paresthesia of skin: Secondary | ICD-10-CM

## 2021-08-30 DIAGNOSIS — I10 Essential (primary) hypertension: Secondary | ICD-10-CM | POA: Insufficient documentation

## 2021-08-30 DIAGNOSIS — C182 Malignant neoplasm of ascending colon: Secondary | ICD-10-CM | POA: Diagnosis not present

## 2021-08-30 DIAGNOSIS — E1142 Type 2 diabetes mellitus with diabetic polyneuropathy: Secondary | ICD-10-CM | POA: Insufficient documentation

## 2021-08-30 DIAGNOSIS — Z79899 Other long term (current) drug therapy: Secondary | ICD-10-CM | POA: Insufficient documentation

## 2021-08-30 MED ORDER — PREGABALIN 25 MG PO CAPS: 25.0000 mg | ORAL_CAPSULE | Freq: Every day | ORAL | 2 refills | Status: DC

## 2021-08-30 NOTE — Progress Notes (Signed)
?[facility]   ?[phone] [phone]   ?Clinic Follow up Note  ? ?Patient Care Team: ?[name], MD as PCP - General (Family Medicine) ?Rolm Bookbinder, MD as Consulting Physician (General Surgery) ?Truitt Merle, MD as Consulting Physician (Hematology) ?Leighton Ruff, MD as Consulting Physician (General Surgery) ?Nicholas Lose, MD as Consulting Physician (Hematology and Oncology) ? ?Date of Service:  08/30/2021 ? ?CHIEF COMPLAINT: f/u of colon cancer and h/o DCIS ? ?CURRENT THERAPY:  ?Surveillance ? ?ASSESSMENT & PLAN:  ?Kim Castro is a 76 y.o. female with  ? ?1. Right colon cancer, pT3N2aM0 stage IIB, MSS ?-She was diagnosed in 08/2019 with cecal mass biopsy showed moderately differentiated adenocarcinoma. CT chest from 10/16/19 did shows multiple small lung nodules that were nonspecific but likely benign. Will monitor.  ?-She underwent colon surgery with Dr Marcello Moores on 11/01/19. Path showed overall Stage IIIB cancer.  ?-She received 6 months of FOLFOX to reduce her risk of recurrence, completed 05/2020. She is currently on surveillance.  ?-surveillance colonoscopy on 02/10/21 under Dr. Carlean Purl showed only diverticulosis. Repeat due in 01/2024. ?-restaging CT CAP 08/26/21 was NED. I reviewed the results with her today.  No definitive evidence of cancer recurrence.  The endplate sclerosis and irregularity at T2-3 is similar, but she is having left-sided neck pain. ?-CEA from 08/26/21 showed a slight jump to 6.99. I discussed that there are other factors that could elevate this. I plan to repeat this in 4-6 weeks to see if it returns to normal. If it remains elevated, I would recommend a PET scan. I advised her to keep her port until we obtain her next tumor marker. ?-We reviewed recommended surveillance, with follow up every 3 months for the first 2-3 years, and then every 6 months to complete 5 years. ?-She is clinically doing well, lab reviewed, exam was unremarkable,  there is no clinical concern for recurrence ?-Follow-up in 3 months ?  ?2. Neuropathy, secondary to Oxaliplatin G2 ?-S/p C7 she started having numbness in hands, L>R which become prolonged with tingling s/p C10.  ?-Oxaliplatin dose reduced and ultimately held with C11-12.  ?-she is on Lyrica and B vit complex ?  ?3. H/o left breast DCIS, G2, ER/PR+ ?-Dx in 05/2014. Treated with left lumpectomy with Dr Excell Seltzer, adjuvant RT with Dr Pablo Ledger. She was on Anastrozole 08/2014-01/13/20. She was previously under the care of Dr Lindi Adie.  ?-DEXA 09/11/20 T score +1.2 normal.  ?-most recent mammogram on 10/02/20 was negative. Repeat scheduled for 10/04/21. ?-We again discussed a breast MRI for breast cancer screening; she is agreeable. We will plan for this in 04/2022. ?  ?4. Submassive bilateral pulmonary embolism, LE Edema  ?-S/p C3 chemo, her 01/27/20 CTA showed PE with right heart strain. She also has bilateral lower extremity edema and Doppler ultrasound was negative for DVT bilaterally.  ?-She was treated with 6 months of Eliquis, Lasix and oral potassium BID for LE Edema. ?  ?5. Comorbidities: Arthritis, DM, HTN, GERD  ?-she switched to Dr. Gena Fray for primary care in 05/2021. ?  ?  ?PLAN:  ?-I refilled her lyrica ?-lab and port flush in 6 weeks ? -will call her with results ?-f/u in 3 months ? ? ? ?No problem-specific Assessment & Plan notes found for this encounter. ? ? ?SUMMARY OF ONCOLOGIC HISTORY: ?Oncology History Overview Note  ?Cancer Staging ?Malignant neoplasm of female breast ([place]) ?Staging form: Breast, AJCC 7th Edition ?- Clinical stage from 06/11/2014: Stage 0 (Tis (DCIS), N0, M0) -  Unsigned ?Staged by: Pathologist and managing physician ?Laterality: Left ?Estrogen receptor status: Positive ?Progesterone receptor status: Positive ?Stage used in treatment planning: Yes ?National guidelines used in treatment planning: Yes ?Type of national guideline used in treatment planning: NCCN ?- Pathologic stage from 07/03/2014:  Stage Unknown (Tis (DCIS), NX, cM0) - Signed by Enid Cutter, MD on 07/10/2014 ?Staged by: Pathologist ?Laterality: Left ?Estrogen receptor status: Positive ?Progesterone receptor status: Positive ?Stage used in treatment planning: Yes ?National guidelines used in treatment planning: Yes ?Type of national guideline used in treatment planning: NCCN ?Staging comments: Staged on final lumpectomy specimen by Dr. Donato Heinz. ? ?right colon cancer ?Staging form: Colon and Rectum, AJCC 8th Edition ?- Pathologic stage from 11/01/2019: Stage IIIB (pT3, pN2a, cM0) - Signed by Truitt Merle, MD on 11/29/2019 ?Stage prefix: Initial diagnosis ?Histologic grading system: 4 grade system ?Histologic grade (G): G2 ?Residual tumor (R): R0 - None ?Tumor deposits (TD): Absent ?Perineural invasion (PNI): Absent ?Microsatellite instability (MSI): Stable ?KRAS mutation: Unknown ?NRAS mutation: Unknown ?BRAF Mutation: Unknown ? ?  ?Malignant neoplasm of female breast [facility])  ?05/29/2014 Initial Biopsy  ? Left breast needle core biopsy: Grade 2, DCIS with calcs. ER+ (100%), PR+ (96%).  ? ?  ?06/04/2014 Initial Diagnosis  ? Left breast DCIS with calcifications, ER 100%, PR 96% ? ?  ?06/10/2014 Breast MRI  ? Left breast: 2.4 x 1.3 x 1.1 cm area of patchy non-mass enhancement upper outer quadrant includes postbiopsy seroma; Right breast: 1.2 cm previously biopsied stable benign fibroadenoma ? ?  ?06/12/2014 Procedure  ? Genetic counseling/testing: Identified 1 VUS on CHEK2 gene. Remainder of 17 gene panel tested negative and included: ATM, BARD1, BRCA 1/2, BRIP1, CDH1, CHEK2, EPCAM, MLH1, MSH2, MSH6, NBN, NF1, PALB2, PTEN, RAD50, RAD51C, RAD51D, STK11, and TP53.  ? ?  ?07/01/2014 Surgery  ? Left breast lumpectomy ([facility]): Grade 1, DCIS, spanning 2.3 cm, 1 mm margin, ER 100%, PR 96% ? ?  ?07/31/2014 - 08/28/2014 Radiation Therapy  ? Adjuvant RT completed Pablo Ledger). Left breast: Total dose 42.5 Gy over 17 fractions. Left breast boost: Total dose 7.5 Gy over 3  fractions.  ? ?  ?09/14/2014 - 01/13/2020 Anti-estrogen oral therapy  ? Anastrazole $RemoveBefor'1mg'[id number]$  daily. Planned duration of treatment: 5 years [place]). Completed in 01/2020.  ?  ?09/25/2014 Survivorship  ? Survivorship Care Plan given to patient and reviewed with her in person.  ? ?  ?03/02/2021 Imaging  ? CT CAP ? ?IMPRESSION: ?1. No findings of active/recurrent malignancy. Partial right ?hemicolectomy. ?2. Endometrial stripe remains mildly thickened, but endometrial ?biopsy in May was negative for malignancy. ?3. Progressive endplate sclerosis and endplate irregularity at T2-3, ?probably due to degenerative endplate findings. If the has referable ?upper thoracic pain/symptoms then thoracic spine MRI could be used for further workup. ?4. Other imaging findings of potential clinical significance: Mild ?cardiomegaly. Aortic Atherosclerosis (ICD10-I70.0). Mild mitral ?valve calcification. Postoperative findings in the left breast with ?adjacent radiation port anteriorly in the left upper lobe. Tiny ?pulmonary nodules in the left lower lobe are unchanged from earliest available comparison of 10/17/2019 and probably benign although may merit surveillance. Multilevel lumbar impingement. Mild pelvic floor laxity. ?  ?Right colon cancer  ?09/19/2019 Imaging  ? CT AP W contrast 09/19/19  ?IMPRESSION ?Fullness in the cecum, cannot exclude a mass. No evidence for metastatic disease is identified.  ?  ?09/24/2019 Procedure  ? Colonoscopy by Dr Eber Jones 09/24/19 ?IMPRESSION ?1. The colon was redundant  ?2. Mild diverticulosis was noted through the entire examined colon ?3. Single  13mm polyp was found in the ascending colon; polypectomy was performed using snare cautery and biopsy forceps ?4. Mild diverticulosis was notes in the descending colon and sigmoid colon.  ?5. Single polyp was found in the sigmoid colon, polypectomy was performed with cold forceps.  ?6. Single polyp was found in the rectosigmoid colon; polypectomy was performed with cold  snare  ?7. Small internal hemorrhoids  ?8. Large mass was found at the cecum; multiple biopsies of the area were performed using cold forceps; injection (tattooing) was performed distal to the mass.  ?  ?09/24/2019

## 2021-08-31 ENCOUNTER — Ambulatory Visit: Payer: Medicare Other

## 2021-09-01 ENCOUNTER — Ambulatory Visit (INDEPENDENT_AMBULATORY_CARE_PROVIDER_SITE_OTHER): Payer: Medicare Other

## 2021-09-01 DIAGNOSIS — Z0000 Encounter for general adult medical examination without abnormal findings: Secondary | ICD-10-CM | POA: Diagnosis not present

## 2021-09-01 NOTE — Progress Notes (Addendum)
? ?Subjective:  ? Kim Castro is a 76 y.o. female who presents for an Initial Medicare Annual Wellness Visit. ? ? ?I connected with Yancey Flemings  today by telephone and verified that I am speaking with the correct person using two identifiers. ?Location patient: home ?Location provider: work ?Persons participating in the virtual visit: patient, provider. ?  ?I discussed the limitations, risks, security and privacy concerns of performing an evaluation and management service by telephone and the availability of in person appointments. I also discussed with the patient that there may be a patient responsible charge related to this service. The patient expressed understanding and verbally consented to this telephonic visit.  ?  ?Interactive audio and video telecommunications were attempted between this provider and patient, however failed, due to patient having technical difficulties OR patient did not have access to video capability.  We continued and completed visit with audio only. ? ?  ?Review of Systems    ? ?Cardiac Risk Factors include: advanced age (>81mn, >>25women);dyslipidemia ? ?   ?Objective:  ?  ?Today's Vitals  ? ?There is no height or weight on file to calculate BMI. ? ? ?  09/01/2021  ?  9:23 AM 06/03/2021  ?  8:46 AM 09/21/2020  ?  1:54 PM 06/01/2020  ? 11:15 AM 05/18/2020  ? 11:44 AM 04/20/2020  ? 12:53 PM 04/06/2020  ? 11:31 AM  ?Advanced Directives  ?Does Patient Have a Medical Advance Directive? No No No No No No No  ?Does patient want to make changes to medical advance directive?  No - Patient declined Yes (MAU/Ambulatory/Procedural Areas - Information given) No - Patient declined     ?Would patient like information on creating a medical advance directive? No - Patient declined No - Patient declined   No - Patient declined No - Patient declined No - Patient declined  ? ? ?Current Medications (verified) ?Outpatient Encounter Medications as of 09/01/2021  ?Medication Sig  ? acetaminophen (TYLENOL)  325 MG tablet Take 650 mg by mouth every 6 (six) hours as needed.  ? aspirin EC 81 MG tablet Take 81 mg by mouth daily. Swallow whole.  ? b complex vitamins capsule Take 1 capsule by mouth daily. Patient unsure of dose  ? Cholecalciferol (VITAMIN D) 2000 UNITS CAPS Take 2,000 Units by mouth daily.   ? lidocaine-prilocaine (EMLA) cream Apply to affected area once  ? loratadine (CLARITIN) 10 MG tablet Take 10 mg by mouth daily as needed for allergies.  ? losartan (COZAAR) 25 MG tablet Take 25 mg by mouth daily.  ? metFORMIN (GLUCOPHAGE) 500 MG tablet Take 1 tablet (500 mg total) by mouth daily with breakfast. (Patient taking differently: Take 500 mg by mouth in the morning and at bedtime.)  ? Multiple Vitamins-Minerals (MULTIVITAMIN WITH MINERALS) tablet Take 1 tablet by mouth daily.  ? omeprazole (PRILOSEC) 20 MG capsule Take 20 mg by mouth daily before breakfast.   ? pravastatin (PRAVACHOL) 10 MG tablet Take 10 mg by mouth daily.  ? pregabalin (LYRICA) 25 MG capsule Take 1 capsule (25 mg total) by mouth daily.  ? ?No facility-administered encounter medications on file as of 09/01/2021.  ? ? ?Allergies (verified) ?Oxycodone  ? ?History: ?Past Medical History:  ?Diagnosis Date  ? Aortic atherosclerosis ([place] [place]   ? Arthritis   ? feet, lower back  ? Basal cell carcinoma   ? arm  ? Breast cancer of upper-outer quadrant of left female breast ([place] 06/04/2014  ? Cataract   ?  immature on the left  ? Colon cancer ([place]) 08/2019  ? Diabetes mellitus without complication ([place])   ? Diverticulosis   ? Dizziness   ? > 1yr ago;took Antivert   ? Family history of anesthesia complication   ? sister slow to wake up with anesthesia  ? Family history of breast cancer   ? Family history of colon cancer   ? Family history of uterine cancer   ? GERD (gastroesophageal reflux disease)   ? takes occasional TUMs  ? History of bronchitis   ? > 89[place]ago  ? History of colon polyps   ? History of hiatal hernia   ? Small noted on CT  ? History of  pulmonary embolus (PE)   ? Hypertension   ? takes Losartan daily and HCTZ  ? Iron deficiency anemia   ? Joint pain   ? Numbness   ? to toes on each foot  ? Peripheral neuropathy   ? feet and toes  ? Personal history of radiation therapy   ? Pulmonary nodules   ? Noted on CT  ? Radiation 07/31/14-08/28/14  ? Left Breast 20 fxs  ? Seasonal allergies   ? takes Claritin prn  ? Urinary frequency   ? Vitamin D deficiency   ? takes VIt D daily  ? ?Past Surgical History:  ?Procedure Laterality Date  ? BREAST BIOPSY Bilateral   ? BREAST LUMPECTOMY Left   ? BREAST LUMPECTOMY WITH RADIOACTIVE SEED LOCALIZATION Left 07/01/2014  ? Procedure: LEFT BREAST LUMPECTOMY WITH RADIOACTIVE SEED LOCALIZATION;  Surgeon: BeExcell SeltzerMD;  [facility]: [facility];  Laterality: Left;  ? CATARACT EXTRACTION Right   ? COLONOSCOPY  09/24/2019  ? [place]  ? LAPAROSCOPIC PARTIAL COLECTOMY N/A 11/01/2019  ? Procedure: LAPAROSCOPIC PARTIAL COLECTOMY;  Surgeon: ThLeighton RuffMD;  Location[facility];  Service: General;  Laterality: N/A;  ? POLYPECTOMY    ? PORTACATH PLACEMENT N/A 12/10/2019  ? Procedure: INSERTION PORT-A-CATH ULTRASOUND GUIDED IN RIGHT IJ;  Surgeon: ThLeighton RuffMD;  Location[facility];  Service: General;  Laterality: N/A;  ? TOTAL KNEE ARTHROPLASTY Left 10/24/2012  ? Procedure: TOTAL KNEE ARTHROPLASTY;  Surgeon: FrKerin SalenMD;  [facility]: [facility]opedics;  Laterality: Left;  ? TOTAL KNEE ARTHROPLASTY Right 01/09/2013  ? Procedure: TOTAL KNEE ARTHROPLASTY;  Surgeon: FrKerin SalenMD;  [facility]: [facility];  Laterality: Right;  ? TUBAL LIGATION    ? ?Family History  ?Problem Relation Age of Onset  ? Diabetes Mother   ? Uterine cancer Mother   ?     deceased [id number]? Hypertension Father   ? Heart disease Father   ? Diabetes Sister   ? Breast cancer Sister 5625?     currently 6166? Hypertension Sister   ? Diabetes Sister   ? Breast cancer Sister   ? Diabetes Sister   ?  Kidney disease Sister   ? Heart disease Sister   ? Lupus Sister   ? Heart disease Sister   ? Diabetes Sister   ? Diabetes Sister   ? Hypertension Brother   ? Diabetes Brother   ? Colon cancer Brother 615? Colon polyps Daughter   ? Thyroid cancer Daughter 3977?     currently 4515type?  ? Cancer Maternal Uncle   ?     unk. primary; deceased 6069s? Stroke Maternal Grandmother   ? Esophageal  cancer Neg Hx   ? Rectal cancer Neg Hx   ? Stomach cancer Neg Hx   ? ?Social History  ? ?Socioeconomic History  ? Marital status: Married  ?  Spouse name: Not on file  ? Number of children: 3  ? Years of education: Not on file  ? Highest education level: Not on file  ?Occupational History  ? Occupation: Retired  ?  Comment: [employer]  ?Tobacco Use  ? Smoking status: Never  ? Smokeless tobacco: Never  ?Vaping Use  ? Vaping Use: Never used  ?Substance and Sexual Activity  ? Alcohol use: No  ? Drug use: No  ? Sexual activity: Yes  ?  Birth control/protection: Surgical, Post-menopausal  ?Other Topics Concern  ? Not on file  ?Social History Narrative  ? Not on file  ? ?Social Determinants of Health  ? ?Financial Resource Strain: Low Risk   ? Difficulty of Paying Living Expenses: Not hard at all  ?Food Insecurity: No Food Insecurity  ? Worried About Charity fundraiser in the Last Year: Never true  ? Ran Out of Food in the Last Year: Never true  ?Transportation Needs: No Transportation Needs  ? Lack of Transportation (Medical): No  ? Lack of Transportation (Non-Medical): No  ?Physical Activity: Insufficiently Active  ? Days of Exercise per Week: 2 days  ? Minutes of Exercise per Session: 20 min  ?Stress: No Stress Concern Present  ? Feeling of Stress : Not at all  ?Social Connections: Moderately Isolated  ? Frequency of Communication with Friends and Family: Three times a week  ? Frequency of Social Gatherings with Friends and Family: Three times a week  ? Attends Religious Services: Never  ? Active Member of Clubs or Organizations:  No  ? Attends Archivist Meetings: Never  ? Marital Status: Married  ? ? ?Tobacco Counseling ?Counseling given: Not Answered ? ? ?Clinical Intake: ? ?Pre-visit preparation completed: Yes ? ?Pain :

## 2021-09-01 NOTE — Patient Instructions (Signed)
Kim Castro , ?Thank you for taking time to come for your Medicare Wellness Visit. I appreciate your ongoing commitment to your health goals. Please review the following plan we discussed and let me know if I can assist you in the future.  ? ?Screening recommendations/referrals: ?Colonoscopy: 02/10/2021 ?Mammogram: 10/02/2020 ?Bone Density: 09/11/2020 ?Recommended yearly ophthalmology/optometry visit for glaucoma screening and checkup ?Recommended yearly dental visit for hygiene and checkup ? ?Vaccinations: ?Influenza vaccine: completed  ?Pneumococcal vaccine: completed  ?Tdap vaccine: 07/11/2018 ?Shingles vaccine: will consider    ? ?Advanced directives: none  ? ?Conditions/risks identified: none  ? ?Next appointment: none  ? ? ?Preventive Care 24 Years and Older, Female ?Preventive care refers to lifestyle choices and visits with your health care provider that can promote health and wellness. ?What does preventive care include? ?A yearly physical exam. This is also called an annual well check. ?Dental exams once or twice a year. ?Routine eye exams. Ask your health care provider how often you should have your eyes checked. ?Personal lifestyle choices, including: ?Daily care of your teeth and gums. ?Regular physical activity. ?Eating a healthy diet. ?Avoiding tobacco and drug use. ?Limiting alcohol use. ?Practicing safe sex. ?Taking low-dose aspirin every day. ?Taking vitamin and mineral supplements as recommended by your health care provider. ?What happens during an annual well check? ?The services and screenings done by your health care provider during your annual well check will depend on your age, overall health, lifestyle risk factors, and family history of disease. ?Counseling  ?Your health care provider may ask you questions about your: ?Alcohol use. ?Tobacco use. ?Drug use. ?Emotional well-being. ?Home and relationship well-being. ?Sexual activity. ?Eating habits. ?History of falls. ?Memory and ability to  understand (cognition). ?Work and work Statistician. ?Reproductive health. ?Screening  ?You may have the following tests or measurements: ?Height, weight, and BMI. ?Blood pressure. ?Lipid and cholesterol levels. These may be checked every 5 years, or more frequently if you are over 24 years old. ?Skin check. ?Lung cancer screening. You may have this screening every year starting at age 32 if you have a 30-pack-year history of smoking and currently smoke or have quit within the past 15 years. ?Fecal occult blood test (FOBT) of the stool. You may have this test every year starting at age 47. ?Flexible sigmoidoscopy or colonoscopy. You may have a sigmoidoscopy every 5 years or a colonoscopy every 10 years starting at age 58. ?Hepatitis C blood test. ?Hepatitis B blood test. ?Sexually transmitted disease (STD) testing. ?Diabetes screening. This is done by checking your blood sugar (glucose) after you have not eaten for a while (fasting). You may have this done every 1-3 years. ?Bone density scan. This is done to screen for osteoporosis. You may have this done starting at age 8. ?Mammogram. This may be done every 1-2 years. Talk to your health care provider about how often you should have regular mammograms. ?Talk with your health care provider about your test results, treatment options, and if necessary, the need for more tests. ?Vaccines  ?Your health care provider may recommend certain vaccines, such as: ?Influenza vaccine. This is recommended every year. ?Tetanus, diphtheria, and acellular pertussis (Tdap, Td) vaccine. You may need a Td booster every 10 years. ?Zoster vaccine. You may need this after age 39. ?Pneumococcal 13-valent conjugate (PCV13) vaccine. One dose is recommended after age 71. ?Pneumococcal polysaccharide (PPSV23) vaccine. One dose is recommended after age 33. ?Talk to your health care provider about which screenings and vaccines you need and how often  you need them. ?This information is not  intended to replace advice given to you by your health care provider. Make sure you discuss any questions you have with your health care provider. ?Document Released: 05/15/2015 Document Revised: 01/06/2016 Document Reviewed: 02/17/2015 ?Elsevier Interactive Patient Education ? 2017 [employer]. ? ?Fall Prevention in the Home ?Falls can cause injuries. They can happen to people of all ages. There are many things you can do to make your home safe and to help prevent falls. ?What can I do on the outside of my home? ?Regularly fix the edges of walkways and driveways and fix any cracks. ?Remove anything that might make you trip as you walk through a door, such as a raised step or threshold. ?Trim any bushes or trees on the path to your home. ?Use bright outdoor lighting. ?Clear any walking paths of anything that might make someone trip, such as rocks or tools. ?Regularly check to see if handrails are loose or broken. Make sure that both sides of any steps have handrails. ?Any raised decks and porches should have guardrails on the edges. ?Have any leaves, snow, or ice cleared regularly. ?Use sand or salt on walking paths during winter. ?Clean up any spills in your garage right away. This includes oil or grease spills. ?What can I do in the bathroom? ?Use night lights. ?Install grab bars by the toilet and in the tub and shower. Do not use towel bars as grab bars. ?Use non-skid mats or decals in the tub or shower. ?If you need to sit down in the shower, use a plastic, non-slip stool. ?Keep the floor dry. Clean up any water that spills on the floor as soon as it happens. ?Remove soap buildup in the tub or shower regularly. ?Attach bath mats securely with double-sided non-slip rug tape. ?Do not have throw rugs and other things on the floor that can make you trip. ?What can I do in the bedroom? ?Use night lights. ?Make sure that you have a light by your bed that is easy to reach. ?Do not use any sheets or blankets that are  too big for your bed. They should not hang down onto the floor. ?Have a firm chair that has side arms. You can use this for support while you get dressed. ?Do not have throw rugs and other things on the floor that can make you trip. ?What can I do in the kitchen? ?Clean up any spills right away. ?Avoid walking on wet floors. ?Keep items that you use a lot in easy-to-reach places. ?If you need to reach something above you, use a strong step stool that has a grab bar. ?Keep electrical cords out of the way. ?Do not use floor polish or wax that makes floors slippery. If you must use wax, use non-skid floor wax. ?Do not have throw rugs and other things on the floor that can make you trip. ?What can I do with my stairs? ?Do not leave any items on the stairs. ?Make sure that there are handrails on both sides of the stairs and use them. Fix handrails that are broken or loose. Make sure that handrails are as Retter as the stairways. ?Check any carpeting to make sure that it is firmly attached to the stairs. Fix any carpet that is loose or worn. ?Avoid having throw rugs at the top or bottom of the stairs. If you do have throw rugs, attach them to the floor with carpet tape. ?Make sure that you have a light  switch at the top of the stairs and the bottom of the stairs. If you do not have them, ask someone to add them for you. ?What else can I do to help prevent falls? ?Wear shoes that: ?Do not have high heels. ?Have rubber bottoms. ?Are comfortable and fit you well. ?Are closed at the toe. Do not wear sandals. ?If you use a stepladder: ?Make sure that it is fully opened. Do not climb a closed stepladder. ?Make sure that both sides of the stepladder are locked into place. ?Ask someone to hold it for you, if possible. ?Clearly mark and make sure that you can see: ?Any grab bars or handrails. ?First and last steps. ?Where the edge of each step is. ?Use tools that help you move around (mobility aids) if they are needed. These  include: ?Canes. ?Walkers. ?Scooters. ?Crutches. ?Turn on the lights when you go into a dark area. Replace any light bulbs as soon as they burn out. ?Set up your furniture so you have a clear path. Avoid moving your fu

## 2021-09-06 DIAGNOSIS — H52223 Regular astigmatism, bilateral: Secondary | ICD-10-CM | POA: Diagnosis not present

## 2021-09-06 LAB — HM DIABETES EYE EXAM

## 2021-10-04 ENCOUNTER — Ambulatory Visit
Admission: RE | Admit: 2021-10-04 | Discharge: 2021-10-04 | Disposition: A | Discharge status: Home / Self Care | Payer: Medicare Other | Source: Ambulatory Visit | Attending: Hematology | Admitting: Hematology

## 2021-10-04 DIAGNOSIS — Z1231 Encounter for screening mammogram for malignant neoplasm of breast: Secondary | ICD-10-CM | POA: Diagnosis not present

## 2021-10-11 ENCOUNTER — Inpatient Hospital Stay: Payer: Medicare Other | Attending: Hematology

## 2021-10-11 ENCOUNTER — Other Ambulatory Visit: Payer: Self-pay

## 2021-10-11 DIAGNOSIS — G620 Drug-induced polyneuropathy: Secondary | ICD-10-CM | POA: Diagnosis not present

## 2021-10-11 DIAGNOSIS — C182 Malignant neoplasm of ascending colon: Secondary | ICD-10-CM

## 2021-10-11 DIAGNOSIS — Z85038 Personal history of other malignant neoplasm of large intestine: Secondary | ICD-10-CM | POA: Insufficient documentation

## 2021-10-11 DIAGNOSIS — Z95828 Presence of other vascular implants and grafts: Secondary | ICD-10-CM

## 2021-10-11 LAB — COMPREHENSIVE METABOLIC PANEL
ALT: 19 U/L (ref 0–44)
AST: 22 U/L (ref 15–41)
Albumin: 4.1 g/dL (ref 3.5–5.0)
Alkaline Phosphatase: 64 U/L (ref 38–126)
Anion gap: 10 (ref 5–15)
BUN: 18 mg/dL (ref 8–23)
CO2: 24 mmol/L (ref 22–32)
Calcium: 9.3 mg/dL (ref 8.9–10.3)
Chloride: 108 mmol/L (ref 98–111)
Creatinine, Ser: 0.81 mg/dL (ref 0.44–1.00)
GFR, Estimated: >60 mL/min (ref >60)
Glucose, Bld: 140 mg/dL — ABNORMAL HIGH (ref 70–99)
Potassium: 3.9 mmol/L (ref 3.5–5.1)
Sodium: 142 mmol/L (ref 135–145)
Total Bilirubin: 1 mg/dL (ref 0.3–1.2)
Total Protein: 6.8 g/dL (ref 6.5–8.1)

## 2021-10-11 LAB — CBC WITH DIFFERENTIAL/PLATELET
Abs Immature Granulocytes: 0.02 10*3/uL (ref 0.00–0.07)
Basophils Absolute: 0 10*3/uL (ref 0.0–0.1)
Basophils Relative: 1 %
Eosinophils Absolute: 0.2 10*3/uL (ref 0.0–0.5)
Eosinophils Relative: 4 %
HCT: 36.9 % (ref 36.0–46.0)
Hemoglobin: 12.1 g/dL (ref 12.0–15.0)
Immature Granulocytes: 0 %
Lymphocytes Relative: 33 %
Lymphs Abs: 2.3 10*3/uL (ref 0.7–4.0)
MCH: 32.4 pg (ref 26.0–34.0)
MCHC: 32.8 g/dL (ref 30.0–36.0)
MCV: 98.7 fL (ref 80.0–100.0)
Monocytes Absolute: 0.6 10*3/uL (ref 0.1–1.0)
Monocytes Relative: 8 %
Neutro Abs: 3.8 10*3/uL (ref 1.7–7.7)
Neutrophils Relative %: 54 %
Platelets: 247 10*3/uL (ref 150–400)
RBC: 3.74 MIL/uL — ABNORMAL LOW (ref 3.87–5.11)
RDW: 12.9 % (ref 11.5–15.5)
WBC: 6.9 10*3/uL (ref 4.0–10.5)
nRBC: 0 % (ref 0.0–0.2)

## 2021-10-11 MED ORDER — HEPARIN SOD (PORK) LOCK FLUSH 100 UNIT/ML IV SOLN
500.0000 [IU] | Freq: Once | INTRAVENOUS | Status: AC | PRN
  Administered 2021-10-11: 500 [IU]

## 2021-10-11 MED ORDER — SODIUM CHLORIDE 0.9% FLUSH
10.0000 mL | INTRAVENOUS | Status: DC | PRN
  Administered 2021-10-11: 10 mL

## 2021-10-17 ENCOUNTER — Other Ambulatory Visit: Payer: Self-pay | Admitting: Hematology

## 2021-10-17 DIAGNOSIS — C182 Malignant neoplasm of ascending colon: Secondary | ICD-10-CM

## 2021-10-19 ENCOUNTER — Other Ambulatory Visit: Payer: Self-pay

## 2021-10-25 ENCOUNTER — Other Ambulatory Visit: Payer: Self-pay

## 2021-10-25 ENCOUNTER — Inpatient Hospital Stay: Payer: Medicare Other

## 2021-10-25 DIAGNOSIS — C182 Malignant neoplasm of ascending colon: Secondary | ICD-10-CM

## 2021-10-25 DIAGNOSIS — G620 Drug-induced polyneuropathy: Secondary | ICD-10-CM | POA: Diagnosis not present

## 2021-10-25 DIAGNOSIS — Z95828 Presence of other vascular implants and grafts: Secondary | ICD-10-CM

## 2021-10-25 LAB — CEA (IN HOUSE-CHCC): CEA (CHCC-In House): 9.83 ng/mL — ABNORMAL HIGH (ref 0.00–5.00)

## 2021-10-25 MED ORDER — HEPARIN SOD (PORK) LOCK FLUSH 100 UNIT/ML IV SOLN
500.0000 [IU] | Freq: Once | INTRAVENOUS | Status: AC | PRN
  Administered 2021-10-25: 500 [IU]

## 2021-10-25 MED ORDER — SODIUM CHLORIDE 0.9% FLUSH
10.0000 mL | INTRAVENOUS | Status: DC | PRN
  Administered 2021-10-25: 10 mL

## 2021-10-26 LAB — HM DEXA SCAN: HM Dexa Scan: NORMAL

## 2021-10-26 LAB — HM DIABETES FOOT EXAM: HM Diabetic Foot Exam: NORMAL

## 2021-10-27 ENCOUNTER — Other Ambulatory Visit: Payer: Self-pay | Admitting: Hematology

## 2021-10-27 ENCOUNTER — Other Ambulatory Visit: Payer: Self-pay

## 2021-10-27 DIAGNOSIS — C182 Malignant neoplasm of ascending colon: Secondary | ICD-10-CM

## 2021-11-01 ENCOUNTER — Telehealth: Payer: Self-pay

## 2021-11-01 NOTE — Telephone Encounter (Signed)
Pt called stating she has scheduled her PET Scan on 11/10/2021 and want to update Dr. Ernestina Penna office on her PET appointment.  Notified Dr. Burr Medico and Alferd Apa, LPN of the pt's appointment.

## 2021-11-09 ENCOUNTER — Encounter: Payer: Self-pay | Admitting: Family Medicine

## 2021-11-09 ENCOUNTER — Ambulatory Visit (INDEPENDENT_AMBULATORY_CARE_PROVIDER_SITE_OTHER): Payer: Medicare Other | Admitting: Family Medicine

## 2021-11-09 VITALS — BP 122/74 | HR 72 | Temp 97.7°F | Ht 65.5 in | Wt 252.4 lb

## 2021-11-09 DIAGNOSIS — I10 Essential (primary) hypertension: Secondary | ICD-10-CM | POA: Diagnosis not present

## 2021-11-09 DIAGNOSIS — E1169 Type 2 diabetes mellitus with other specified complication: Secondary | ICD-10-CM

## 2021-11-09 DIAGNOSIS — Z85038 Personal history of other malignant neoplasm of large intestine: Secondary | ICD-10-CM | POA: Diagnosis not present

## 2021-11-09 DIAGNOSIS — E7849 Other hyperlipidemia: Secondary | ICD-10-CM | POA: Diagnosis not present

## 2021-11-09 DIAGNOSIS — E785 Hyperlipidemia, unspecified: Secondary | ICD-10-CM

## 2021-11-09 LAB — LIPID PANEL
Cholesterol: 141 mg/dL (ref 0–200)
HDL: 62.4 mg/dL (ref >39.00)
LDL Cholesterol: 43 mg/dL (ref 0–99)
NonHDL: 78.77
Total CHOL/HDL Ratio: 2
Triglycerides: 177 mg/dL — ABNORMAL HIGH (ref 0.0–149.0)
VLDL: 35.4 mg/dL (ref 0.0–40.0)

## 2021-11-09 LAB — HEMOGLOBIN A1C: Hgb A1c MFr Bld: 6.9 % — ABNORMAL HIGH (ref 4.6–6.5)

## 2021-11-09 LAB — GLUCOSE, RANDOM: Glucose, Bld: 126 mg/dL — ABNORMAL HIGH (ref 70–99)

## 2021-11-09 MED ORDER — METFORMIN HCL 500 MG PO TABS: 500.0000 mg | ORAL_TABLET | Freq: Two times a day (BID) | ORAL | Status: DC

## 2021-11-09 NOTE — Progress Notes (Signed)
[facility] [address] [place] [address] Dept: [phone] Dept Fax: [phone]  Chronic Care Office Visit  Subjective:    Patient ID: Kim Castro, female    DOB: April 08, 1946, 76 y.o..   MRN: [id number]  Chief Complaint  Patient presents with   Follow-up    3 month f/u DM/HTN/chol.  Fasting today.      History of Present Illness:  Patient is in today for reassessment of chronic medical issues.  Kim Castro has a history of type 2 diabetes. She is managed on metformin 500 mg bid. She recently had her annual eye exam, which showed no sign of retinopathy.   Kim Castro has a history of hypertension. She is managed on losartan 25 mg daily.   Kim Castro has a history of hyperlipidemia. She is currently on pravastatin 10 mg daily.  Kim Castro has a history of both breat and colon cancer. Her oncologist had been monitoring her CEA levels. She has had a progressive increase in levles over the past year. Kim Castro is scheduled for a PET scan tomorrow to assess for possible cancer recurrence. She is trying not to worry and take things as they come.  Past Medical History: Patient Active Problem List   Diagnosis Date Noted   Essential hypertension 05/12/2021   Vitamin D deficiency 05/12/2021   Personal history of malignant neoplasm of breast 05/12/2021   Personal history of colonic polyps 05/12/2021   Morbid obesity ([place]) 05/12/2021   Hyperlipidemia 05/12/2021   Hypercalcemia 05/12/2021   Allergic rhinitis 05/12/2021   Knee joint replacement status, bilateral 05/12/2021   Neuropathy, secondary to Oxaliplatin G2 05/12/2021   Aortic atherosclerosis ([place]) 05/12/2021   History of colon cancer, stage III 02/10/2021   Gastroesophageal reflux disease    Type 2 diabetes mellitus with hyperlipidemia ([place])    History of pulmonary embolism 01/27/2020   Port-A-Cath in place 12/30/2019   Right colon cancer 11/01/2019   Genetic  testing 07/08/2014   Malignant neoplasm of female breast ([place]) 06/04/2014   Past Surgical History:  Procedure Laterality Date   BREAST BIOPSY Bilateral    BREAST LUMPECTOMY Left    BREAST LUMPECTOMY WITH RADIOACTIVE SEED LOCALIZATION Left 07/01/2014   Procedure: LEFT BREAST LUMPECTOMY WITH RADIOACTIVE SEED LOCALIZATION;  Surgeon: Excell Seltzer, MD;  Location: [facility];  Service: General;  Laterality: Left;   CATARACT EXTRACTION Right    COLONOSCOPY  09/24/2019   [facility]   LAPAROSCOPIC PARTIAL COLECTOMY N/A 11/01/2019   Procedure: LAPAROSCOPIC PARTIAL COLECTOMY;  Surgeon: Leighton Ruff, MD;  Location: [facility];  Service: General;  Laterality: N/A;   POLYPECTOMY     PORTACATH PLACEMENT N/A 12/10/2019   Procedure: INSERTION PORT-A-CATH ULTRASOUND GUIDED IN RIGHT IJ;  Surgeon: Leighton Ruff, MD;  Location: [facility];  Service: General;  Laterality: N/A;   TOTAL KNEE ARTHROPLASTY Left 10/24/2012   Procedure: TOTAL KNEE ARTHROPLASTY;  Surgeon: Kerin Salen, MD;  Location: [facility];  Service: Orthopedics;  Laterality: Left;   TOTAL KNEE ARTHROPLASTY Right 01/09/2013   Procedure: TOTAL KNEE ARTHROPLASTY;  Surgeon: Kerin Salen, MD;  Location: [facility];  Service: Orthopedics;  Laterality: Right;   TUBAL LIGATION     Family History  Problem Relation Age of Onset   Diabetes Mother    Uterine cancer Mother        deceased 65   Hypertension Father    Heart disease Father    Diabetes Sister    Breast  cancer Sister 73       currently 74   Hypertension Sister    Diabetes Sister    Breast cancer Sister    Diabetes Sister    Kidney disease Sister    Heart disease Sister    Lupus Sister    Heart disease Sister    Diabetes Sister    Diabetes Sister    Hypertension Brother    Diabetes Brother    Colon cancer Brother 92   Colon polyps Daughter    Thyroid cancer Daughter 46       currently 72; type?   Cancer Maternal Uncle        unk. primary; deceased 38s   Stroke Maternal  Grandmother    Esophageal cancer Neg Hx    Rectal cancer Neg Hx    Stomach cancer Neg Hx    Outpatient Medications Prior to Visit  Medication Sig Dispense Refill   acetaminophen (TYLENOL) 325 MG tablet Take 650 mg by mouth every 6 (six) hours as needed.     aspirin EC 81 MG tablet Take 81 mg by mouth daily. Swallow whole.     b complex vitamins capsule Take 1 capsule by mouth daily. Patient unsure of dose     Cholecalciferol (VITAMIN D) 2000 UNITS CAPS Take 2,000 Units by mouth daily.      lidocaine-prilocaine (EMLA) cream Apply to affected area once 30 g 3   loratadine (CLARITIN) 10 MG tablet Take 10 mg by mouth daily as needed for allergies.     losartan (COZAAR) 25 MG tablet Take 25 mg by mouth daily.     Multiple Vitamins-Minerals (MULTIVITAMIN WITH MINERALS) tablet Take 1 tablet by mouth daily.     omeprazole (PRILOSEC) 20 MG capsule Take 20 mg by mouth daily before breakfast.      pravastatin (PRAVACHOL) 10 MG tablet Take 10 mg by mouth daily.     pregabalin (LYRICA) 25 MG capsule Take 1 capsule (25 mg total) by mouth daily. 30 capsule 2   metFORMIN (GLUCOPHAGE) 500 MG tablet Take 1 tablet (500 mg total) by mouth daily with breakfast. (Patient taking differently: Take 500 mg by mouth in the morning and at bedtime.)     No facility-administered medications prior to visit.   Allergies  Allergen Reactions   Oxycodone Nausea And Vomiting     Objective:   Today's Vitals   11/09/21 0955  BP: 122/74  Pulse: 72  Temp: 97.7 F (36.5 C)  TempSrc: Temporal  SpO2: 96%  Weight: 252 lb 6.4 oz (114.5 kg)  Height: 5' 5.5" (1.664 m)   Body mass index is 41.36 kg/m.   General: Well developed, well nourished. No acute distress. Psych: Alert and oriented. Normal mood and affect.  There are no preventive care reminders to display for this patient.    Lab Results: Component Ref Range & Units 2 wk ago (10/25/21) 2 mo ago (08/26/21) 8 mo ago (03/02/21) 11 mo ago (11/30/20) 1 yr  ago (08/26/20) 1 yr ago (03/23/20) 1 yr ago (12/30/19)  CEA ([facility]) 0.00 - 5.00 ng/mL 9.83 High   6.99 High  CM  4.17 CM  3.41 CM  2.45 CM  5.24 High  CM  4.56 CM     Assessment & Plan:   1. Type 2 diabetes mellitus with hyperlipidemia ([place]) Diabetes has been at goal. We will continue her metformin.  - metFORMIN (GLUCOPHAGE) 500 MG tablet; Take 1 tablet (500 mg total) by mouth in the morning and  at bedtime. - Glucose, random - Hemoglobin A1c  2. Essential hypertension Blood pressure is at goal. Continue losartan 25 mg daily.  3. Other hyperlipidemia Due for repeat lipids. Continue pravastatin 10 mg daily. - Lipid panel  4. History of colon cancer, stage III I reviewed oncology consult notes. PET scan scheduled for tomorrow top follow up escalating CEA level.  Return in about 3 months (around 02/09/2022) for Reassessment.   [name], MD

## 2021-11-10 ENCOUNTER — Ambulatory Visit (HOSPITAL_COMMUNITY)
Admission: RE | Admit: 2021-11-10 | Discharge: 2021-11-10 | Disposition: A | Discharge status: Home / Self Care | Payer: Medicare Other | Source: Ambulatory Visit | Attending: Hematology | Admitting: Hematology

## 2021-11-10 DIAGNOSIS — C182 Malignant neoplasm of ascending colon: Secondary | ICD-10-CM | POA: Insufficient documentation

## 2021-11-10 LAB — GLUCOSE, CAPILLARY: Glucose-Capillary: 136 mg/dL — ABNORMAL HIGH (ref 70–99)

## 2021-11-10 MED ORDER — FLUDEOXYGLUCOSE F - 18 (FDG) INJECTION
12.6100 | Freq: Once | INTRAVENOUS | Status: AC | PRN
  Administered 2021-11-10: 12.61 via INTRAVENOUS

## 2021-11-16 ENCOUNTER — Inpatient Hospital Stay: Payer: Medicare Other | Attending: Hematology | Admitting: Nurse Practitioner

## 2021-11-16 ENCOUNTER — Telehealth: Payer: Self-pay | Admitting: *Deleted

## 2021-11-16 ENCOUNTER — Encounter: Payer: Self-pay | Admitting: Nurse Practitioner

## 2021-11-16 ENCOUNTER — Telehealth: Payer: Self-pay

## 2021-11-16 DIAGNOSIS — R918 Other nonspecific abnormal finding of lung field: Secondary | ICD-10-CM

## 2021-11-16 DIAGNOSIS — Z85038 Personal history of other malignant neoplasm of large intestine: Secondary | ICD-10-CM | POA: Insufficient documentation

## 2021-11-16 DIAGNOSIS — E1136 Type 2 diabetes mellitus with diabetic cataract: Secondary | ICD-10-CM | POA: Insufficient documentation

## 2021-11-16 DIAGNOSIS — G620 Drug-induced polyneuropathy: Secondary | ICD-10-CM | POA: Diagnosis not present

## 2021-11-16 DIAGNOSIS — Z803 Family history of malignant neoplasm of breast: Secondary | ICD-10-CM | POA: Insufficient documentation

## 2021-11-16 DIAGNOSIS — R9389 Abnormal findings on diagnostic imaging of other specified body structures: Secondary | ICD-10-CM | POA: Insufficient documentation

## 2021-11-16 DIAGNOSIS — C182 Malignant neoplasm of ascending colon: Secondary | ICD-10-CM

## 2021-11-16 DIAGNOSIS — N83202 Unspecified ovarian cyst, left side: Secondary | ICD-10-CM | POA: Insufficient documentation

## 2021-11-16 DIAGNOSIS — Z86711 Personal history of pulmonary embolism: Secondary | ICD-10-CM | POA: Insufficient documentation

## 2021-11-16 DIAGNOSIS — I10 Essential (primary) hypertension: Secondary | ICD-10-CM | POA: Insufficient documentation

## 2021-11-16 DIAGNOSIS — R971 Elevated cancer antigen 125 [CA 125]: Secondary | ICD-10-CM | POA: Insufficient documentation

## 2021-11-16 DIAGNOSIS — Z800 Family history of malignant neoplasm of digestive organs: Secondary | ICD-10-CM | POA: Insufficient documentation

## 2021-11-16 DIAGNOSIS — E119 Type 2 diabetes mellitus without complications: Secondary | ICD-10-CM | POA: Diagnosis not present

## 2021-11-16 DIAGNOSIS — Z853 Personal history of malignant neoplasm of breast: Secondary | ICD-10-CM | POA: Insufficient documentation

## 2021-11-16 NOTE — Telephone Encounter (Signed)
Per Melissa APP schedule the patient for a follow up with Dr Berline Lopes

## 2021-11-16 NOTE — Telephone Encounter (Signed)
This nurse reached out to patient and advised that provider would like her to stop by to have a port flush and labs done after scheduled MRI on 7/27.  Patient is in agreement.  No concerns or questions at this time.

## 2021-11-16 NOTE — Progress Notes (Signed)
[place]   [phone] [phone]   Clinic Follow up Note   Patient Care Team: [name], MD as PCP - General (Family Medicine) Rolm Bookbinder, MD as Consulting Physician (General Surgery) Truitt Merle, MD as Consulting Physician (Hematology) Leighton Ruff, MD as Consulting Physician (General Surgery) Nicholas Lose, MD as Consulting Physician (Hematology and Oncology) 11/16/2021   I connected with Kim Castro on 11/16/21 at 12:00 PM EDT by telephone visit and verified that I am speaking with the correct person using two identifiers.   I discussed the limitations, risks, security and privacy concerns of performing an evaluation and management service by telemedicine and the availability of in-person appointments. I also discussed with the patient that there may be a patient responsible charge related to this service. The patient expressed understanding and agreed to proceed.   Other persons participating in the visit and their role in the encounter: None   Patient's location: Home  Provider's location: [facility]   CHIEF COMPLAINT: Follow up PET scan results   SUMMARY OF ONCOLOGIC HISTORY: Oncology History Overview Note  Cancer Staging Malignant neoplasm of female breast [facility]) Staging form: Breast, AJCC 7th Edition - Clinical stage from 06/11/2014: Stage 0 (Tis (DCIS), N0, M0) - Unsigned Staged by: Pathologist and managing physician Laterality: Left Estrogen receptor status: Positive Progesterone receptor status: Positive Stage used in treatment planning: Yes National guidelines used in treatment planning: Yes Type of national guideline used in treatment planning: NCCN - Pathologic stage from 07/03/2014: Stage Unknown (Tis (DCIS), NX, cM0) - Signed by Enid Cutter, MD on 07/10/2014 Staged by: Pathologist Laterality: Left Estrogen receptor status: Positive Progesterone receptor status: Positive Stage used in treatment planning:  Yes National guidelines used in treatment planning: Yes Type of national guideline used in treatment planning: NCCN Staging comments: Staged on final lumpectomy specimen by Dr. Donato Heinz.  right colon cancer Staging form: Colon and Rectum, AJCC 8th Edition - Pathologic stage from 11/01/2019: Stage IIIB (pT3, pN2a, cM0) - Signed by Truitt Merle, MD on 11/29/2019 Stage prefix: Initial diagnosis Histologic grading system: 4 grade system Histologic grade (G): G2 Residual tumor (R): R0 - None Tumor deposits (TD): Absent Perineural invasion (PNI): Absent Microsatellite instability (MSI): Stable KRAS mutation: Unknown NRAS mutation: Unknown BRAF Mutation: Unknown    Malignant neoplasm of female breast ([place])  05/29/2014 Initial Biopsy   Left breast needle core biopsy: Grade 2, DCIS with calcs. ER+ (100%), PR+ (96%).    06/04/2014 Initial Diagnosis   Left breast DCIS with calcifications, ER 100%, PR 96%   06/10/2014 Breast MRI   Left breast: 2.4 x 1.3 x 1.1 cm area of patchy non-mass enhancement upper outer quadrant includes postbiopsy seroma; Right breast: 1.2 cm previously biopsied stable benign fibroadenoma   06/12/2014 Procedure   Genetic counseling/testing: Identified 1 VUS on CHEK2 gene. Remainder of 17 gene panel tested negative and included: ATM, BARD1, BRCA 1/2, BRIP1, CDH1, CHEK2, EPCAM, MLH1, MSH2, MSH6, NBN, NF1, PALB2, PTEN, RAD50, RAD51C, RAD51D, STK11, and TP53.    07/01/2014 Surgery   Left breast lumpectomy ([place]): Grade 1, DCIS, spanning 2.3 cm, 1 mm margin, ER 100%, PR 96%   07/31/2014 - 08/28/2014 Radiation Therapy   Adjuvant RT completed Pablo Ledger). Left breast: Total dose 42.5 Gy over 17 fractions. Left breast boost: Total dose 7.5 Gy over 3 fractions.    09/14/2014 - 01/13/2020 Anti-estrogen oral therapy   Anastrazole $RemoveBefo'1mg'wsAokrMDKFE$  daily. Planned duration of treatment: 5 years [place]). Completed in 01/2020.    09/25/2014  Survivorship   Survivorship Care Plan given to patient and reviewed  with her in person.    03/02/2021 Imaging   CT CAP  IMPRESSION: 1. No findings of active/recurrent malignancy. Partial right hemicolectomy. 2. Endometrial stripe remains mildly thickened, but endometrial biopsy in May was negative for malignancy. 3. Progressive endplate sclerosis and endplate irregularity at T2-3, probably due to degenerative endplate findings. If the has referable upper thoracic pain/symptoms then thoracic spine MRI could be used for further workup. 4. Other imaging findings of potential clinical significance: Mild cardiomegaly. Aortic Atherosclerosis (ICD10-I70.0). Mild mitral valve calcification. Postoperative findings in the left breast with adjacent radiation port anteriorly in the left upper lobe. Tiny pulmonary nodules in the left lower lobe are unchanged from earliest available comparison of 10/17/2019 and probably benign although may merit surveillance. Multilevel lumbar impingement. Mild pelvic floor laxity.   Right colon cancer  09/19/2019 Imaging   CT AP W contrast 09/19/19  IMPRESSION Fullness in the cecum, cannot exclude a mass. No evidence for metastatic disease is identified.    09/24/2019 Procedure   Colonoscopy by Dr Eber Jones 09/24/19 IMPRESSION 1. The colon was redundant  2. Mild diverticulosis was noted through the entire examined colon 3. Single 54mm polyp was found in the ascending colon; polypectomy was performed using snare cautery and biopsy forceps 4. Mild diverticulosis was notes in the descending colon and sigmoid colon.  5. Single polyp was found in the sigmoid colon, polypectomy was performed with cold forceps.  6. Single polyp was found in the rectosigmoid colon; polypectomy was performed with cold snare  7. Small internal hemorrhoids  8. Large mass was found at the cecum; multiple biopsies of the area were performed using cold forceps; injection (tattooing) was performed distal to the mass.    09/24/2019 Initial Biopsy   INTERPRETATION AND  DIAGNOSIS:  A. Cecum, biopsy:  Invasive moderately differentiated adenocarcinoma.  see comment  B. Polyp @ ascending colon, polypectomy:  Tubular Adenoma  C. Polyp @ sigmoid colon Polypectomy:  hyperplastic polyp.  D. Polyp @ rectosigmoid colon, Polypectomy:  Hyperplastic Polyp      10/16/2019 Imaging   CT Chest IMPRESSION: 1. Multiple small pulmonary nodules measuring 5 mm or less in size in the lungs. These are nonspecific and are typically considered statistically likely benign. However, given the patient's history of primary malignancy, close attention on follow-up studies is recommended to ensure stability. 2. Aortic atherosclerosis, in addition to right coronary artery disease. Assessment for potential risk factor modification, dietary therapy or pharmacologic therapy may be warranted, if clinically indicated. 3. There are calcifications of the aortic valve and mitral annulus. Echocardiographic correlation for evaluation of potential valvular dysfunction may be warranted if clinically indicated. 4. Small hiatal hernia.   Aortic Atherosclerosis (ICD10-I70.0).   11/01/2019 Initial Diagnosis   Colon cancer ([place])   11/01/2019 Surgery   LAPAROSCOPIC PARTIAL COLECTOMY by Dr Marcello Moores and Dr Johney Maine   11/01/2019 Pathology Results   FINAL MICROSCOPIC DIAGNOSIS:   A. COLON, PROXIMAL RIGHT, COLECTOMY:  - Invasive colonic adenocarcinoma, 5 cm.  - Tumor invades through the muscularis propria into pericolonic tissues.   - Margins of resection are not involved.  - Metastatic carcinoma in (5) of (13) lymph nodes.  - See oncology table.    MSI Stable  Mismatch repair normal  MLH1 - Preserved nuclear expression (greater 50% tumor expression) MSH2 - Preserved nuclear expression (greater 50% tumor expression) MSH6 - Preserved nuclear expression (greater 50% tumor expression) PMS2 - Preserved nuclear expression (greater 50%  tumor expression)   11/01/2019 Cancer Staging   Staging form:  Colon and Rectum, AJCC 8th Edition - Pathologic stage from 11/01/2019: Stage IIIB (pT3, pN2a, cM0) - Signed by Truitt Merle, MD on 11/29/2019   12/10/2019 Procedure   PAC placed 12/10/19   12/17/2019 - 06/01/2020 Chemotherapy   FOLFOX q2weeks starting in 2 weeks starting 12/17/19. Held 01/27/20-02/10/20 due to b/l PE. Oxaliplatin held C11-12 due to neuropathy. Completed on 06/01/20   03/02/2021 Imaging   CT CAP  IMPRESSION: 1. No findings of active/recurrent malignancy. Partial right hemicolectomy. 2. Endometrial stripe remains mildly thickened, but endometrial biopsy in May was negative for malignancy. 3. Progressive endplate sclerosis and endplate irregularity at T2-3, probably due to degenerative endplate findings. If the has referable upper thoracic pain/symptoms then thoracic spine MRI could be used for further workup. 4. Other imaging findings of potential clinical significance: Mild cardiomegaly. Aortic Atherosclerosis (ICD10-I70.0). Mild mitral valve calcification. Postoperative findings in the left breast with adjacent radiation port anteriorly in the left upper lobe. Tiny pulmonary nodules in the left lower lobe are unchanged from earliest available comparison of 10/17/2019 and probably benign although may merit surveillance. Multilevel lumbar impingement. Mild pelvic floor laxity.   08/26/2021 Imaging   EXAM: CT CHEST, ABDOMEN, AND PELVIS WITH CONTRAST  IMPRESSION: 1. Stable examination without new or progressive findings to suggest local recurrence or metastatic disease within the chest, abdomen, or pelvis. 2. Hepatomegaly with hepatic steatosis. 3. Sigmoid colonic diverticulosis without findings of acute diverticulitis. 4. Similar prominent endplate sclerosis and irregularity at T2-T3 is most consistent with Modic type endplate degenerative changes. However, if patient has referable upper thoracic pain consider further workup with thoracic spine MRI. 5. Similar thickening of the  endometrial stripe measuring up to 8 mm, which was previously biopsied with results negative for malignancy. 6. Aortic Atherosclerosis (ICD10-I70.0).   11/10/2021 PET scan   IMPRESSION: 1. LEFT ovary is increased in size and is intensely hypermetabolic. While physiologic hypermetabolic ovarian tissue is not uncommon, the enlargement and asymmetric activity warrants further evaluation. Consider contrast pelvic MRI vs tissue sampling. 2. No evidence of metastatic colorectal carcinoma otherwise. 3. Post RIGHT hemicolectomy anatomy. 4. Evidence of radiation change in the LEFT upper lobe (remote breast cancer).       CURRENT THERAPY: PENDING further work up   INTERVAL HISTORY: Kim Castro presents for phone follow-up as scheduled.  She is doing well at home. She remains active but tires quicker, she attributes to her age.  She denies abdominal pain but feels bloated "all the time", nothing new.  Bowels moving.  Denies nausea/vomiting.  She is able to eat and drink.  She has had back pain and sciatica for 40 years, and 6 months of neck pain that is intermittent.  Denies new pains.  Neuropathy is stable on Lyrica and B complex vitamin.  There is mild tingling in her fingers and numbness in her feet but no pain.  All other systems were reviewed with the patient and are negative.  MEDICAL HISTORY:  Past Medical History:  Diagnosis Date   Aortic atherosclerosis (HCC)    Arthritis    feet, lower back   Basal cell carcinoma    arm   Breast cancer of upper-outer quadrant of left female breast ([place]) 06/04/2014   Cataract    immature on the left   Colon cancer ([place]) 08/2019   Diabetes mellitus without complication (HCC)    Diverticulosis    Dizziness    > 74yrs ago;took Antivert  Family history of anesthesia complication    sister slow to wake up with anesthesia   Family history of breast cancer    Family history of colon cancer    Family history of uterine cancer    GERD (gastroesophageal  reflux disease)    takes occasional TUMs   History of bronchitis    > 15yrs ago   History of colon polyps    History of hiatal hernia    Small noted on CT   History of pulmonary embolus (PE)    Hypertension    takes Losartan daily and HCTZ   Iron deficiency anemia    Joint pain    Numbness    to toes on each foot   Peripheral neuropathy    feet and toes   Personal history of radiation therapy    Pulmonary nodules    Noted on CT   Radiation 07/31/14-08/28/14   Left Breast 20 fxs   Seasonal allergies    takes Claritin prn   Urinary frequency    Vitamin D deficiency    takes VIt D daily    SURGICAL HISTORY: Past Surgical History:  Procedure Laterality Date   BREAST BIOPSY Bilateral    BREAST LUMPECTOMY Left    BREAST LUMPECTOMY WITH RADIOACTIVE SEED LOCALIZATION Left 07/01/2014   Procedure: LEFT BREAST LUMPECTOMY WITH RADIOACTIVE SEED LOCALIZATION;  Surgeon: Excell Seltzer, MD;  Location: [place];  Service: General;  Laterality: Left;   CATARACT EXTRACTION Right    COLONOSCOPY  09/24/2019   [place]   LAPAROSCOPIC PARTIAL COLECTOMY N/A 11/01/2019   Procedure: LAPAROSCOPIC PARTIAL COLECTOMY;  Surgeon: Leighton Ruff, MD;  Location: [facility];  Service: General;  Laterality: N/A;   POLYPECTOMY     PORTACATH PLACEMENT N/A 12/10/2019   Procedure: INSERTION PORT-A-CATH ULTRASOUND GUIDED IN RIGHT IJ;  Surgeon: Leighton Ruff, MD;  Location: [facility];  Service: General;  Laterality: N/A;   TOTAL KNEE ARTHROPLASTY Left 10/24/2012   Procedure: TOTAL KNEE ARTHROPLASTY;  Surgeon: Kerin Salen, MD;  Location: [place];  Service: Orthopedics;  Laterality: Left;   TOTAL KNEE ARTHROPLASTY Right 01/09/2013   Procedure: TOTAL KNEE ARTHROPLASTY;  Surgeon: Kerin Salen, MD;  Location: [place];  Service: Orthopedics;  Laterality: Right;   TUBAL LIGATION      I have reviewed the social history and family history with the patient and they are unchanged from previous  note.  ALLERGIES:  is allergic to oxycodone.  MEDICATIONS:  Current Outpatient Medications  Medication Sig Dispense Refill   acetaminophen (TYLENOL) 325 MG tablet Take 650 mg by mouth every 6 (six) hours as needed.     aspirin EC 81 MG tablet Take 81 mg by mouth daily. Swallow whole.     b complex vitamins capsule Take 1 capsule by mouth daily. Patient unsure of dose     Cholecalciferol (VITAMIN D) 2000 UNITS CAPS Take 2,000 Units by mouth daily.      lidocaine-prilocaine (EMLA) cream Apply to affected area once 30 g 3   loratadine (CLARITIN) 10 MG tablet Take 10 mg by mouth daily as needed for allergies.     losartan (COZAAR) 25 MG tablet Take 25 mg by mouth daily.     metFORMIN (GLUCOPHAGE) 500 MG tablet Take 1 tablet (500 mg total) by mouth in the morning and at bedtime.     Multiple Vitamins-Minerals (MULTIVITAMIN WITH MINERALS) tablet Take 1 tablet by mouth daily.     omeprazole (PRILOSEC) 20 MG capsule Take 20  mg by mouth daily before breakfast.      pravastatin (PRAVACHOL) 10 MG tablet Take 10 mg by mouth daily.     pregabalin (LYRICA) 25 MG capsule Take 1 capsule (25 mg total) by mouth daily. 30 capsule 2   No current facility-administered medications for this visit.    PHYSICAL EXAMINATION: ECOG PERFORMANCE STATUS: 1 - Symptomatic but completely ambulatory  There were no vitals filed for this visit. There were no vitals filed for this visit.  Patient appears well over the phone.  Voice is clear, speech is strong. No cough or conversational dyspnea.  LABORATORY DATA:  I have reviewed the data as listed    Latest Ref Rng & Units 10/11/2021    7:41 AM 08/26/2021    7:48 AM 06/03/2021    7:38 AM  CBC  WBC 4.0 - 10.5 K/uL 6.9  6.8  8.1   Hemoglobin 12.0 - 15.0 g/dL 12.1  11.9  12.6   Hematocrit 36.0 - 46.0 % 36.9  36.7  38.3   Platelets 150 - 400 K/uL 247  247  259         Latest Ref Rng & Units 11/09/2021   10:28 AM 10/11/2021    7:41 AM 08/26/2021    7:48 AM  CMP   Glucose 70 - 99 mg/dL 126  140  139   BUN 8 - 23 mg/dL  18  22   Creatinine 0.44 - 1.00 mg/dL  0.81  0.72   Sodium 135 - 145 mmol/L  142  139   Potassium 3.5 - 5.1 mmol/L  3.9  4.1   Chloride 98 - 111 mmol/L  108  107   CO2 22 - 32 mmol/L  24  24   Calcium 8.9 - 10.3 mg/dL  9.3  9.4   Total Protein 6.5 - 8.1 g/dL  6.8  6.7   Total Bilirubin 0.3 - 1.2 mg/dL  1.0  1.1   Alkaline Phos 38 - 126 U/L  64  69   AST 15 - 41 U/L  22  18   ALT 0 - 44 U/L  19  16       RADIOGRAPHIC STUDIES: I have personally reviewed the radiological images as listed and agreed with the findings in the report. No results found.   ASSESSMENT & PLAN: Kim Castro is a 76 y.o. female with    1. Right colon cancer, pT3N2aM0 stage IIB, MSS -Diagnosed in 08/2019 with cecal mass biopsy showed moderately differentiated adenocarcinoma. CT chest from 10/16/19 did shows multiple small lung nodules that were nonspecific but likely benign. Will monitor.  -S/p surgery with Dr Marcello Moores on 11/01/19. Path showed overall Stage IIIB cancer.  -She received 6 months of FOLFOX to reduce her risk of recurrence, completed 05/2020. She is currently on surveillance.  -surveillance colonoscopy on 02/10/21 under Dr. Carlean Purl showed only diverticulosis. Repeat due in 01/2024. -restaging CT CAP 08/26/21 was NED. No definitive evidence of cancer recurrence.  The endplate sclerosis and irregularity at T2-3 is similar, she endorses neck pain . -CEA began rising 08/26/21 to 6.99, then to 9.83 on 10/25/2021 -She underwent a PET scan which I reviewed independently and discussed with other providers and with the patient today, which shows enlargement and intense FDG uptake in the left ovary, concerning for malignancy.  No other evidence of recurrent or metastatic disease. -Given her history of stage III colon cancer and rising CEA this is strongly favored to be metastatic colon cancer to the  left ovary rather than primary ovarian cancer.  The case was  discussed with gyn oncologist Dr. Jeral Pinch.   -Ms. Devita appears stable over the phone.  She always feels bloated but nothing new/worse, and denies abdominal pain.  She is eating and drinking well and maintaining a good performance status. -We are recommending further work-up with pelvic MRI and tumor marker testing with CA125, pt agrees. -She is being referred to Dr. Berline Lopes as well, and has follow-up with Dr. Burr Medico already scheduled 7/31. -We will follow closely   2. Neuropathy, secondary to Oxaliplatin G2 -S/p C7 she started having numbness in hands, L>R which become prolonged with tingling s/p C10.  -Oxaliplatin dose reduced and ultimately held with C11-12.  -improved, now minor tingling in fingertips, numbness in toes; no pain -continue Lyrica and B vit complex   3. H/o left breast DCIS, G2, ER/PR+ -Dx in 05/2014. S/p left lumpectomy with Dr Excell Seltzer, adjuvant RT with Dr Pablo Ledger, and adjuvant Anastrozole 08/2014-01/13/20 under the care of Dr Lindi Adie.  -DEXA 09/11/20 T score +1.2 normal.  -Mammogram 10/04/2021 was negative -No recurrent breast cancer on recent PET scan    4. Submassive bilateral pulmonary embolism, LE Edema  -S/p C3 chemo, her 01/27/20 CTA showed PE with right heart strain. She also has bilateral lower extremity edema and Doppler ultrasound was negative for DVT bilaterally.  -She was treated with 6 months of Eliquis, Lasix and oral potassium BID for LE Edema.   5. Comorbidities: Arthritis, DM, HTN, GERD, neck and back pain with sciatica -she switched to Dr. Gena Fray for primary care in 05/2021.  Plan: -recent labs and PET scan reviewed -Proceed with pelvic MRI and CA 125 -Discussed case with Dr. Burr Medico and Dr. Berline Lopes, referral placed to gyn/onc -F/up Dr. Burr Medico 7/31 as scheduled   Orders Placed This Encounter  Procedures   MR Pelvis W Wo Contrast    Standing Status:   Future    Standing Expiration Date:   11/17/2022    Order Specific Question:   If indicated for the  ordered procedure, I authorize the administration of contrast media per Radiology protocol    Answer:   Yes    Order Specific Question:   What is the patient's sedation requirement?    Answer:   No Sedation    Order Specific Question:   Does the patient have a pacemaker or implanted devices?    Answer:   No    Order Specific Question:   Preferred imaging location?    Answer:   [facility] (table limit - 550 lbs)   CA 125    Standing Status:   Standing    Number of Occurrences:   1    Standing Expiration Date:   11/17/2022   Ambulatory referral to Gynecologic Oncology    Referral Priority:   Routine    Referral Type:   Consultation    Referral Reason:   Specialty Services Required    Requested Specialty:   Gynecologic Oncology    Number of Visits Requested:   1   I discussed the assessment and treatment plan with the patient. The patient was provided an opportunity to ask questions and all were answered. The patient agreed with the plan and demonstrated an understanding of the instructions.   The patient was advised to call back or seek an in-person evaluation if the symptoms worsen or if the condition fails to improve as anticipated. The total time spent in the appointment was 20 minutes and  more than 50% was on counseling and review of test results and coordination of care.     Alla Feeling, NP 11/16/21

## 2021-11-18 ENCOUNTER — Telehealth: Payer: Self-pay | Admitting: *Deleted

## 2021-11-18 ENCOUNTER — Telehealth: Payer: Self-pay | Admitting: Hematology

## 2021-11-18 NOTE — Telephone Encounter (Signed)
.  Called patient to schedule appointment per 7/19 inbasket, patient is aware of date and time.   

## 2021-11-18 NOTE — Telephone Encounter (Signed)
Called and rescheduled the patient's appt from 8/10 to 7/31. Also moved Dr Ernestina Penna appt from 8 am to 2 pm. Spoke with the patient and gave the new dates and times

## 2021-11-22 ENCOUNTER — Other Ambulatory Visit: Payer: Self-pay

## 2021-11-25 ENCOUNTER — Other Ambulatory Visit: Payer: Medicare Other

## 2021-11-25 ENCOUNTER — Ambulatory Visit (HOSPITAL_COMMUNITY)
Admission: RE | Admit: 2021-11-25 | Discharge: 2021-11-25 | Disposition: A | Discharge status: Home / Self Care | Payer: Medicare Other | Source: Ambulatory Visit | Attending: Nurse Practitioner | Admitting: Nurse Practitioner

## 2021-11-25 DIAGNOSIS — N8000 Endometriosis of the uterus, unspecified: Secondary | ICD-10-CM | POA: Diagnosis not present

## 2021-11-25 DIAGNOSIS — C182 Malignant neoplasm of ascending colon: Secondary | ICD-10-CM | POA: Insufficient documentation

## 2021-11-25 DIAGNOSIS — C189 Malignant neoplasm of colon, unspecified: Secondary | ICD-10-CM | POA: Diagnosis not present

## 2021-11-25 DIAGNOSIS — K5730 Diverticulosis of large intestine without perforation or abscess without bleeding: Secondary | ICD-10-CM | POA: Diagnosis not present

## 2021-11-25 DIAGNOSIS — N83202 Unspecified ovarian cyst, left side: Secondary | ICD-10-CM | POA: Diagnosis not present

## 2021-11-25 MED ORDER — GADOBUTROL 1 MMOL/ML IV SOLN
10.0000 mL | Freq: Once | INTRAVENOUS | Status: AC | PRN
  Administered 2021-11-25: 10 mL via INTRAVENOUS

## 2021-11-26 ENCOUNTER — Encounter: Payer: Self-pay | Admitting: Gynecologic Oncology

## 2021-11-29 ENCOUNTER — Other Ambulatory Visit: Payer: Self-pay | Admitting: Family Medicine

## 2021-11-29 ENCOUNTER — Encounter: Payer: Self-pay | Admitting: Gynecologic Oncology

## 2021-11-29 ENCOUNTER — Inpatient Hospital Stay (HOSPITAL_BASED_OUTPATIENT_CLINIC_OR_DEPARTMENT_OTHER): Payer: Medicare Other | Admitting: Gynecologic Oncology

## 2021-11-29 ENCOUNTER — Inpatient Hospital Stay: Payer: Medicare Other | Admitting: Hematology

## 2021-11-29 ENCOUNTER — Inpatient Hospital Stay: Payer: Medicare Other

## 2021-11-29 ENCOUNTER — Other Ambulatory Visit: Payer: Self-pay

## 2021-11-29 ENCOUNTER — Other Ambulatory Visit: Payer: Medicare Other

## 2021-11-29 VITALS — BP 127/60 | HR 88 | Temp 98.5°F | Resp 18 | Ht 65.5 in | Wt 252.8 lb

## 2021-11-29 VITALS — BP 135/67 | HR 78 | Temp 98.0°F | Resp 16 | Wt 252.2 lb

## 2021-11-29 DIAGNOSIS — R971 Elevated cancer antigen 125 [CA 125]: Secondary | ICD-10-CM | POA: Diagnosis not present

## 2021-11-29 DIAGNOSIS — N83202 Unspecified ovarian cyst, left side: Secondary | ICD-10-CM | POA: Diagnosis not present

## 2021-11-29 DIAGNOSIS — G620 Drug-induced polyneuropathy: Secondary | ICD-10-CM | POA: Diagnosis not present

## 2021-11-29 DIAGNOSIS — C182 Malignant neoplasm of ascending colon: Secondary | ICD-10-CM

## 2021-11-29 DIAGNOSIS — N840 Polyp of corpus uteri: Secondary | ICD-10-CM | POA: Diagnosis not present

## 2021-11-29 DIAGNOSIS — I10 Essential (primary) hypertension: Secondary | ICD-10-CM | POA: Diagnosis not present

## 2021-11-29 DIAGNOSIS — R978 Other abnormal tumor markers: Secondary | ICD-10-CM

## 2021-11-29 DIAGNOSIS — Z803 Family history of malignant neoplasm of breast: Secondary | ICD-10-CM | POA: Diagnosis not present

## 2021-11-29 DIAGNOSIS — R9389 Abnormal findings on diagnostic imaging of other specified body structures: Secondary | ICD-10-CM

## 2021-11-29 DIAGNOSIS — Z800 Family history of malignant neoplasm of digestive organs: Secondary | ICD-10-CM | POA: Diagnosis not present

## 2021-11-29 DIAGNOSIS — Z853 Personal history of malignant neoplasm of breast: Secondary | ICD-10-CM | POA: Diagnosis not present

## 2021-11-29 DIAGNOSIS — E1136 Type 2 diabetes mellitus with diabetic cataract: Secondary | ICD-10-CM | POA: Diagnosis not present

## 2021-11-29 DIAGNOSIS — E1169 Type 2 diabetes mellitus with other specified complication: Secondary | ICD-10-CM

## 2021-11-29 DIAGNOSIS — N9489 Other specified conditions associated with female genital organs and menstrual cycle: Secondary | ICD-10-CM

## 2021-11-29 DIAGNOSIS — Z85038 Personal history of other malignant neoplasm of large intestine: Secondary | ICD-10-CM | POA: Diagnosis not present

## 2021-11-29 DIAGNOSIS — Z95828 Presence of other vascular implants and grafts: Secondary | ICD-10-CM

## 2021-11-29 DIAGNOSIS — Z86711 Personal history of pulmonary embolism: Secondary | ICD-10-CM | POA: Diagnosis not present

## 2021-11-29 LAB — COMPREHENSIVE METABOLIC PANEL
ALT: 14 U/L (ref 0–44)
AST: 19 U/L (ref 15–41)
Albumin: 4.1 g/dL (ref 3.5–5.0)
Alkaline Phosphatase: 64 U/L (ref 38–126)
Anion gap: 6 (ref 5–15)
BUN: 17 mg/dL (ref 8–23)
CO2: 27 mmol/L (ref 22–32)
Calcium: 8.7 mg/dL — ABNORMAL LOW (ref 8.9–10.3)
Chloride: 109 mmol/L (ref 98–111)
Creatinine, Ser: 0.73 mg/dL (ref 0.44–1.00)
GFR, Estimated: >60 mL/min (ref >60)
Glucose, Bld: 118 mg/dL — ABNORMAL HIGH (ref 70–99)
Potassium: 3.9 mmol/L (ref 3.5–5.1)
Sodium: 142 mmol/L (ref 135–145)
Total Bilirubin: 1 mg/dL (ref 0.3–1.2)
Total Protein: 7 g/dL (ref 6.5–8.1)

## 2021-11-29 LAB — CBC WITH DIFFERENTIAL/PLATELET
Abs Immature Granulocytes: 0.02 10*3/uL (ref 0.00–0.07)
Basophils Absolute: 0.1 10*3/uL (ref 0.0–0.1)
Basophils Relative: 1 %
Eosinophils Absolute: 0.2 10*3/uL (ref 0.0–0.5)
Eosinophils Relative: 2 %
HCT: 36.6 % (ref 36.0–46.0)
Hemoglobin: 12.2 g/dL (ref 12.0–15.0)
Immature Granulocytes: 0 %
Lymphocytes Relative: 42 %
Lymphs Abs: 3.4 10*3/uL (ref 0.7–4.0)
MCH: 33 pg (ref 26.0–34.0)
MCHC: 33.3 g/dL (ref 30.0–36.0)
MCV: 98.9 fL (ref 80.0–100.0)
Monocytes Absolute: 0.7 10*3/uL (ref 0.1–1.0)
Monocytes Relative: 8 %
Neutro Abs: 3.8 10*3/uL (ref 1.7–7.7)
Neutrophils Relative %: 47 %
Platelets: 256 10*3/uL (ref 150–400)
RBC: 3.7 MIL/uL — ABNORMAL LOW (ref 3.87–5.11)
RDW: 13.2 % (ref 11.5–15.5)
WBC: 8.2 10*3/uL (ref 4.0–10.5)
nRBC: 0 % (ref 0.0–0.2)

## 2021-11-29 LAB — CEA (IN HOUSE-CHCC): CEA (CHCC-In House): 16.23 ng/mL — ABNORMAL HIGH (ref 0.00–5.00)

## 2021-11-29 MED ORDER — SENNOSIDES-DOCUSATE SODIUM 8.6-50 MG PO TABS: 2.0000 | ORAL_TABLET | Freq: Every day | ORAL | 0 refills | Status: DC

## 2021-11-29 MED ORDER — HEPARIN SOD (PORK) LOCK FLUSH 100 UNIT/ML IV SOLN
500.0000 [IU] | Freq: Once | INTRAVENOUS | Status: AC | PRN
  Administered 2021-11-29: 500 [IU]

## 2021-11-29 MED ORDER — SODIUM CHLORIDE 0.9% FLUSH
10.0000 mL | INTRAVENOUS | Status: DC | PRN
  Administered 2021-11-29: 10 mL

## 2021-11-29 MED ORDER — TRAMADOL HCL 50 MG PO TABS: 50.0000 mg | ORAL_TABLET | Freq: Four times a day (QID) | ORAL | 0 refills | Status: DC | PRN

## 2021-11-29 NOTE — Patient Instructions (Signed)
Dr. Berline Lopes will contact you with the results of your endometrial biopsy from today.   Preparing for your Surgery   Plan for surgery on December 22, 2021 with Dr. Jeral Pinch at [facility] will be scheduled for robotic assisted laparoscopic bilateral salpingo-oophorectomy (removal of both ovaries and fallopian tubes), possible robotic assisted total laparoscopic hysterectomy (removal of the uterus and cervix), possible dilation and curettage of the uterus, possible staging if a cancer is identified, possible laparotomy (larger incision on your abdomen if needed).    Pre-operative Testing -You will receive a phone call from presurgical testing at [facility] to arrange for a pre-operative appointment and lab work.   -Bring your insurance card, copy of an advanced directive if applicable, medication list   -At that visit, you will be asked to sign a consent for a possible blood transfusion in case a transfusion becomes necessary during surgery.  The need for a blood transfusion is rare but having consent is a necessary part of your care.      -You can continue taking your baby aspirin (81 mg) with your last dose being the DAY BEFORE surgery in the am.   -Do not take supplements such as fish oil (omega 3), red yeast rice, turmeric before your surgery. You want to avoid medications with aspirin in them including headache powders such as BC or Goody's), Excedrin migraine.   Day Before Surgery at [place] will be asked to take in a light diet the day before surgery. You will be advised you can have clear liquids up until 3 hours before your surgery.     Eat a light diet the day before surgery.  Examples including soups, broths, toast, yogurt, mashed potatoes.  AVOID GAS PRODUCING FOODS. Things to avoid include carbonated beverages (fizzy beverages, sodas), raw fruits and raw vegetables (uncooked), or beans.    If your bowels are filled with gas, your surgeon will have  difficulty visualizing your pelvic organs which increases your surgical risks.   Your role in recovery Your role is to become active as soon as directed by your doctor, while still giving yourself time to heal.  Rest when you feel tired. You will be asked to do the following in order to speed your recovery:   - Cough and breathe deeply. This helps to clear and expand your lungs and can prevent pneumonia after surgery.  - [place]. Do mild physical activity. Walking or moving your legs help your circulation and body functions return to normal. Do not try to get up or walk alone the first time after surgery.   -If you develop swelling on one leg or the other, pain in the back of your leg, redness/warmth in one of your legs, please call the office or go to the Emergency Room to have a doppler to rule out a blood clot. For shortness of breath, chest pain-seek care in the Emergency Room as soon as possible. - Actively manage your pain. Managing your pain lets you move in comfort. We will ask you to rate your pain on a scale of zero to 10. It is your responsibility to tell your doctor or nurse where and how much you hurt so your pain can be treated.   Special Considerations -If you are diabetic, you may be placed on insulin after surgery to have closer control over your blood sugars to promote healing and recovery.  This does not mean that you will be  discharged on insulin.  If applicable, your oral antidiabetics will be resumed when you are tolerating a solid diet.   -Your final pathology results from surgery should be available around one week after surgery and the results will be relayed to you when available.   -FMLA forms can be faxed to [phone] and please allow 5-7 business days for completion.   Pain Management After Surgery -You have been prescribed your pain medication (tramadol) and bowel regimen medications before surgery so that you can have these available when you  are discharged from the hospital. The pain medication is for use ONLY AFTER surgery and a new prescription will not be given.    -Make sure that you have Tylenol and Ibuprofen IF YOU ARE ABLE TO TAKE THESE MEDICATIONS at home to use on a regular basis after surgery for pain control. We recommend alternating the medications every hour to six hours since they work differently and are processed in the body differently for pain relief.   -Review the attached handout on narcotic use and their risks and side effects.    Bowel Regimen -You have been prescribed Sennakot-S to take nightly to prevent constipation especially if you are taking the narcotic pain medication intermittently.  It is important to prevent constipation and drink adequate amounts of liquids. You can stop taking this medication when you are not taking pain medication and you are back on your normal bowel routine.   Risks of Surgery Risks of surgery are low but include bleeding, infection, damage to surrounding structures, re-operation, blood clots, and very rarely death.     Blood Transfusion Information (For the consent to be signed before surgery)   We will be checking your blood type before surgery so in case of emergencies, we will know what type of blood you would need.                                             WHAT IS A BLOOD TRANSFUSION?   A transfusion is the replacement of blood or some of its parts. Blood is made up of multiple cells which provide different functions. Red blood cells carry oxygen and are used for blood loss replacement. White blood cells fight against infection. Platelets control bleeding. Plasma helps clot blood. Other blood products are available for specialized needs, such as hemophilia or other clotting disorders. BEFORE THE TRANSFUSION  Who gives blood for transfusions?  You may be able to donate blood to be used at a later date on yourself (autologous donation). Relatives can be asked to  donate blood. This is generally not any safer than if you have received blood from a stranger. The same precautions are taken to ensure safety when a relative's blood is donated. Healthy volunteers who are fully evaluated to make sure their blood is safe. This is blood bank blood. Transfusion therapy is the safest it has ever been in the practice of medicine. Before blood is taken from a donor, a complete history is taken to make sure that person has no history of diseases nor engages in risky social behavior (examples are intravenous drug use or sexual activity with multiple partners). The donor's travel history is screened to minimize risk of transmitting infections, such as malaria. The donated blood is tested for signs of infectious diseases, such as HIV and hepatitis. The blood is then tested to be sure  it is compatible with you in order to minimize the chance of a transfusion reaction. If you or a relative donates blood, this is often done in anticipation of surgery and is not appropriate for emergency situations. It takes many days to process the donated blood. RISKS AND COMPLICATIONS Although transfusion therapy is very safe and saves many lives, the main dangers of transfusion include:  Getting an infectious disease. Developing a transfusion reaction. This is an allergic reaction to something in the blood you were given. Every precaution is taken to prevent this. The decision to have a blood transfusion has been considered carefully by your caregiver before blood is given. Blood is not given unless the benefits outweigh the risks.   AFTER SURGERY INSTRUCTIONS   Return to work: 4-6 weeks if applicable   Activity: 1. Be up and out of the bed during the day.  Take a nap if needed.  You may walk up steps but be careful and use the hand rail.  Stair climbing will tire you more than you think, you may need to stop part way and rest.    2. No lifting or straining for 6 weeks over 10 pounds. No  pushing, pulling, straining for 6 weeks.   3. No driving for around 1 week(s).  Do not drive if you are taking narcotic pain medicine and make sure that your reaction time has returned.    4. You can shower as soon as the next day after surgery. Shower daily.  Use your regular soap and water (not directly on the incision) and pat your incision(s) dry afterwards; don't rub.  No tub baths or submerging your body in water until cleared by your surgeon. If you have the soap that was given to you by pre-surgical testing that was used before surgery, you do not need to use it afterwards because this can irritate your incisions.    5. No sexual activity and nothing in the vagina for 4 weeks, 8 weeks if you have a hysterectomy (removal of the uterus and cervix).   6. You may experience a small amount of clear drainage from your incisions, which is normal.  If the drainage persists, increases, or changes color please call the office.   7. Do not use creams, lotions, or ointments such as neosporin on your incisions after surgery until advised by your surgeon because they can cause removal of the dermabond glue on your incisions.     8. You may experience vaginal spotting after surgery or around the 6-8 week mark from surgery when the stitches at the top of the vagina begin to dissolve (if you have a hysterectomy).  The spotting is normal but if you experience heavy bleeding, call our office.   9. Take Tylenol or ibuprofen first for pain if you are able to take these medications and only use narcotic pain medication for severe pain not relieved by the Tylenol or Ibuprofen.  Monitor your Tylenol intake to a max of 4,000 mg in a 24 hour period. You can alternate these medications after surgery.   Diet: 1. Low sodium Heart Healthy Diet is recommended but you are cleared to resume your normal (before surgery) diet after your procedure.   2. It is safe to use a laxative, such as Miralax or Colace, if you have  difficulty moving your bowels. You have been prescribed Sennakot-S to take at bedtime every evening after surgery to keep bowel movements regular and to prevent constipation.  Wound Care: 1. Keep clean and dry.  Shower daily.   Reasons to call the Doctor: Fever - Oral temperature greater than 100.4 degrees Fahrenheit Foul-smelling vaginal discharge Difficulty urinating Nausea and vomiting Increased pain at the site of the incision that is unrelieved with pain medicine. Difficulty breathing with or without chest pain New calf pain especially if only on one side Sudden, continuing increased vaginal bleeding with or without clots.   Contacts: For questions or concerns you should contact:   Dr. Jeral Pinch at [phone]   Joylene John, NP at [phone]   After Hours: call [phone] and have the GYN Oncologist paged/contacted (after 5 pm or on the weekends).   Messages sent via mychart are for non-urgent matters and are not responded to after hours so for urgent needs, please call the after hours number.

## 2021-11-29 NOTE — H&P (View-Only) (Signed)
Gynecologic Oncology Return Clinic Visit  11/29/21  Reason for Visit: Follow-up visit in the setting of abnormal endometrial on imaging as well as adnexal mass that is FDG avid in the setting of increasing CEA  Treatment History: Oncology History Overview Note  Cancer Staging Malignant neoplasm of female breast [facility]) Staging form: Breast, AJCC 7th Edition - Clinical stage from 06/11/2014: Stage 0 (Tis (DCIS), N0, M0) - Unsigned Staged by: Pathologist and managing physician Laterality: Left Estrogen receptor status: Positive Progesterone receptor status: Positive Stage used in treatment planning: Yes National guidelines used in treatment planning: Yes Type of national guideline used in treatment planning: NCCN - Pathologic stage from 07/03/2014: Stage Unknown (Tis (DCIS), NX, cM0) - Signed by Enid Cutter, MD on 07/10/2014 Staged by: Pathologist Laterality: Left Estrogen receptor status: Positive Progesterone receptor status: Positive Stage used in treatment planning: Yes National guidelines used in treatment planning: Yes Type of national guideline used in treatment planning: NCCN Staging comments: Staged on final lumpectomy specimen by Dr. Donato Heinz.  right colon cancer Staging form: Colon and Rectum, AJCC 8th Edition - Pathologic stage from 11/01/2019: Stage IIIB (pT3, pN2a, cM0) - Signed by Truitt Merle, MD on 11/29/2019 Stage prefix: Initial diagnosis Histologic grading system: 4 grade system Histologic grade (G): G2 Residual tumor (R): R0 - None Tumor deposits (TD): Absent Perineural invasion (PNI): Absent Microsatellite instability (MSI): Stable KRAS mutation: Unknown NRAS mutation: Unknown BRAF Mutation: Unknown    Malignant neoplasm of female breast ([place])  05/29/2014 Initial Biopsy   Left breast needle core biopsy: Grade 2, DCIS with calcs. ER+ (100%), PR+ (96%).    06/04/2014 Initial Diagnosis   Left breast DCIS with calcifications, ER 100%, PR 96%   06/10/2014 Breast MRI    Left breast: 2.4 x 1.3 x 1.1 cm area of patchy non-mass enhancement upper outer quadrant includes postbiopsy seroma; Right breast: 1.2 cm previously biopsied stable benign fibroadenoma   06/12/2014 Procedure   Genetic counseling/testing: Identified 1 VUS on CHEK2 gene. Remainder of 17 gene panel tested negative and included: ATM, BARD1, BRCA 1/2, BRIP1, CDH1, CHEK2, EPCAM, MLH1, MSH2, MSH6, NBN, NF1, PALB2, PTEN, RAD50, RAD51C, RAD51D, STK11, and TP53.    07/01/2014 Surgery   Left breast lumpectomy ([facility]): Grade 1, DCIS, spanning 2.3 cm, 1 mm margin, ER 100%, PR 96%   07/31/2014 - 08/28/2014 Radiation Therapy   Adjuvant RT completed [facility]). Left breast: Total dose 42.5 Gy over 17 fractions. Left breast boost: Total dose 7.5 Gy over 3 fractions.    09/14/2014 - 01/13/2020 Anti-estrogen oral therapy   Anastrazole 66m daily. Planned duration of treatment: 5 years ([place]. Completed in 01/2020.    09/25/2014 Survivorship   Survivorship Care Plan given to patient and reviewed with her in person.    03/02/2021 Imaging   CT CAP  IMPRESSION: 1. No findings of active/recurrent malignancy. Partial right hemicolectomy. 2. Endometrial stripe remains mildly thickened, but endometrial biopsy in May was negative for malignancy. 3. Progressive endplate sclerosis and endplate irregularity at T2-3, probably due to degenerative endplate findings. If the has referable upper thoracic pain/symptoms then thoracic spine MRI could be used for further workup. 4. Other imaging findings of potential clinical significance: Mild cardiomegaly. Aortic Atherosclerosis (ICD10-I70.0). Mild mitral valve calcification. Postoperative findings in the left breast with adjacent radiation port anteriorly in the left upper lobe. Tiny pulmonary nodules in the left lower lobe are unchanged from earliest available comparison of 10/17/2019 and probably benign although may merit surveillance. Multilevel lumbar impingement. Mild  pelvic floor laxity.  Right colon cancer  09/19/2019 Imaging   CT AP W contrast 09/19/19  IMPRESSION Fullness in the cecum, cannot exclude a mass. No evidence for metastatic disease is identified.    09/24/2019 Procedure   Colonoscopy by Dr Eber Jones 09/24/19 IMPRESSION 1. The colon was redundant  2. Mild diverticulosis was noted through the entire examined colon 3. Single 17m polyp was found in the ascending colon; polypectomy was performed using snare cautery and biopsy forceps 4. Mild diverticulosis was notes in the descending colon and sigmoid colon.  5. Single polyp was found in the sigmoid colon, polypectomy was performed with cold forceps.  6. Single polyp was found in the rectosigmoid colon; polypectomy was performed with cold snare  7. Small internal hemorrhoids  8. Large mass was found at the cecum; multiple biopsies of the area were performed using cold forceps; injection (tattooing) was performed distal to the mass.    09/24/2019 Initial Biopsy   INTERPRETATION AND DIAGNOSIS:  A. Cecum, biopsy:  Invasive moderately differentiated adenocarcinoma.  see comment  B. Polyp @ ascending colon, polypectomy:  Tubular Adenoma  C. Polyp @ sigmoid colon Polypectomy:  hyperplastic polyp.  D. Polyp @ rectosigmoid colon, Polypectomy:  Hyperplastic Polyp      10/16/2019 Imaging   CT Chest IMPRESSION: 1. Multiple small pulmonary nodules measuring 5 mm or less in size in the lungs. These are nonspecific and are typically considered statistically likely benign. However, given the patient's history of primary malignancy, close attention on follow-up studies is recommended to ensure stability. 2. Aortic atherosclerosis, in addition to right coronary artery disease. Assessment for potential risk factor modification, dietary therapy or pharmacologic therapy may be warranted, if clinically indicated. 3. There are calcifications of the aortic valve and mitral annulus. Echocardiographic  correlation for evaluation of potential valvular dysfunction may be warranted if clinically indicated. 4. Small hiatal hernia.   Aortic Atherosclerosis (ICD10-I70.0).   11/01/2019 Initial Diagnosis   Colon cancer ([facility]   11/01/2019 Surgery   LAPAROSCOPIC PARTIAL COLECTOMY by Dr TMarcello Mooresand Dr GJohney Maine  11/01/2019 Pathology Results   FINAL MICROSCOPIC DIAGNOSIS:   A. COLON, PROXIMAL RIGHT, COLECTOMY:  - Invasive colonic adenocarcinoma, 5 cm.  - Tumor invades through the muscularis propria into pericolonic tissues.   - Margins of resection are not involved.  - Metastatic carcinoma in (5) of (13) lymph nodes.  - See oncology table.    MSI Stable  Mismatch repair normal  MLH1 - Preserved nuclear expression (greater 50% tumor expression) MSH2 - Preserved nuclear expression (greater 50% tumor expression) MSH6 - Preserved nuclear expression (greater 50% tumor expression) PMS2 - Preserved nuclear expression (greater 50% tumor expression)   11/01/2019 Cancer Staging   Staging form: Colon and Rectum, AJCC 8th Edition - Pathologic stage from 11/01/2019: Stage IIIB (pT3, pN2a, cM0) - Signed by FTruitt Merle MD on 11/29/2019   12/10/2019 Procedure   PAC placed 12/10/19   12/17/2019 - 06/01/2020 Chemotherapy   FOLFOX q2weeks starting in 2 weeks starting 12/17/19. Held 01/27/20-02/10/20 due to b/l PE. Oxaliplatin held C11-12 due to neuropathy. Completed on 06/01/20   03/02/2021 Imaging   CT CAP  IMPRESSION: 1. No findings of active/recurrent malignancy. Partial right hemicolectomy. 2. Endometrial stripe remains mildly thickened, but endometrial biopsy in May was negative for malignancy. 3. Progressive endplate sclerosis and endplate irregularity at T2-3, probably due to degenerative endplate findings. If the has referable upper thoracic pain/symptoms then thoracic spine MRI could be used for further workup. 4. Other imaging findings of potential clinical  significance: Mild cardiomegaly. Aortic  Atherosclerosis (ICD10-I70.0). Mild mitral valve calcification. Postoperative findings in the left breast with adjacent radiation port anteriorly in the left upper lobe. Tiny pulmonary nodules in the left lower lobe are unchanged from earliest available comparison of 10/17/2019 and probably benign although may merit surveillance. Multilevel lumbar impingement. Mild pelvic floor laxity.   08/26/2021 Imaging   EXAM: CT CHEST, ABDOMEN, AND PELVIS WITH CONTRAST  IMPRESSION: 1. Stable examination without new or progressive findings to suggest local recurrence or metastatic disease within the chest, abdomen, or pelvis. 2. Hepatomegaly with hepatic steatosis. 3. Sigmoid colonic diverticulosis without findings of acute diverticulitis. 4. Similar prominent endplate sclerosis and irregularity at T2-T3 is most consistent with Modic type endplate degenerative changes. However, if patient has referable upper thoracic pain consider further workup with thoracic spine MRI. 5. Similar thickening of the endometrial stripe measuring up to 8 mm, which was previously biopsied with results negative for malignancy. 6. Aortic Atherosclerosis (ICD10-I70.0).   11/10/2021 PET scan   IMPRESSION: 1. LEFT ovary is increased in size and is intensely hypermetabolic. While physiologic hypermetabolic ovarian tissue is not uncommon, the enlargement and asymmetric activity warrants further evaluation. Consider contrast pelvic MRI vs tissue sampling. 2. No evidence of metastatic colorectal carcinoma otherwise. 3. Post RIGHT hemicolectomy anatomy. 4. Evidence of radiation change in the LEFT upper lobe (remote breast cancer).      omponent Ref Range & Units 1 mo ago (10/25/21) 3 mo ago (08/26/21) 9 mo ago (03/02/21) 12 mo ago (11/30/20) 1 yr ago (08/26/20) 1 yr ago (03/23/20) 1 yr ago (12/30/19)  CEA ([facility]) 0.00 - 5.00 ng/mL 9.83 High   6.99 High  CM  4.17 CM  3.41 CM  2.45 CM  5.24 High  CM  4.56 CM      Interval History: Patient reports overall doing well.  She has had some very light vaginal spotting for approximately 1 week, which she describes as seeing a spot here and there.  She has occasional "aching" in her pelvis.  She denies any cramping or significant abdominal pain.  She endorses continued loose stools, no change recently.  Bladder function is at baseline.  She endorses a good appetite without nausea or emesis.  Past Medical/Surgical History: Past Medical History:  Diagnosis Date   Aortic atherosclerosis (HCC)    Arthritis    feet, lower back   Basal cell carcinoma    arm   Breast cancer of upper-outer quadrant of left female breast ([place]) 06/04/2014   Cataract    immature on the left   Colon cancer ([place]) 08/2019   Diabetes mellitus without complication (HCC)    Diverticulosis    Dizziness    > 31yr ago;took Antivert    Family history of anesthesia complication    sister slow to wake up with anesthesia   Family history of breast cancer    Family history of colon cancer    Family history of uterine cancer    GERD (gastroesophageal reflux disease)    takes occasional TUMs   History of bronchitis    > 89[place]   History of colon polyps    History of hiatal hernia    Small noted on CT   History of pulmonary embolus (PE)    Hypertension    takes Losartan daily and HCTZ   Iron deficiency anemia    Joint pain    Numbness    to toes on each foot   Peripheral neuropathy  feet and toes   Personal history of radiation therapy    Pulmonary nodules    Noted on CT   Radiation 07/31/14-08/28/14   Left Breast 20 fxs   Seasonal allergies    takes Claritin prn   Urinary frequency    Vitamin D deficiency    takes VIt D daily    Past Surgical History:  Procedure Laterality Date   BREAST BIOPSY Bilateral    BREAST LUMPECTOMY Left    BREAST LUMPECTOMY WITH RADIOACTIVE SEED LOCALIZATION Left 07/01/2014   Procedure: LEFT BREAST LUMPECTOMY WITH RADIOACTIVE SEED  LOCALIZATION;  Surgeon: Excell Seltzer, MD;  Location: [facility];  Service: General;  Laterality: Left;   CATARACT EXTRACTION Right    COLONOSCOPY  09/24/2019   [place]   LAPAROSCOPIC PARTIAL COLECTOMY N/A 11/01/2019   Procedure: LAPAROSCOPIC PARTIAL COLECTOMY;  Surgeon: Leighton Ruff, MD;  Location: [facility];  Service: General;  Laterality: N/A;   POLYPECTOMY     PORTACATH PLACEMENT N/A 12/10/2019   Procedure: INSERTION PORT-A-CATH ULTRASOUND GUIDED IN RIGHT IJ;  Surgeon: Leighton Ruff, MD;  Location: [facility];  Service: General;  Laterality: N/A;   TOTAL KNEE ARTHROPLASTY Left 10/24/2012   Procedure: TOTAL KNEE ARTHROPLASTY;  Surgeon: Kerin Salen, MD;  Location: [facility];  Service: Orthopedics;  Laterality: Left;   TOTAL KNEE ARTHROPLASTY Right 01/09/2013   Procedure: TOTAL KNEE ARTHROPLASTY;  Surgeon: Kerin Salen, MD;  Location: [facility];  Service: Orthopedics;  Laterality: Right;   TUBAL LIGATION      Family History  Problem Relation Age of Onset   Diabetes Mother    Uterine cancer Mother        deceased 69   Hypertension Father    Heart disease Father    Diabetes Sister    Breast cancer Sister 32       currently 88   Hypertension Sister    Diabetes Sister    Breast cancer Sister    Diabetes Sister    Kidney disease Sister    Heart disease Sister    Lupus Sister    Heart disease Sister    Diabetes Sister    Diabetes Sister    Hypertension Brother    Diabetes Brother    Colon cancer Brother 14   Colon polyps Daughter    Thyroid cancer Daughter 70       currently 67; type?   Cancer Maternal Uncle        unk. primary; deceased 58s   Stroke Maternal Grandmother    Esophageal cancer Neg Hx    Rectal cancer Neg Hx    Stomach cancer Neg Hx     Social History   Socioeconomic History   Marital status: Married    Spouse name: Not on file   Number of children: 3   Years of education: Not on file   Highest education level: Not on file  Occupational  History   Occupation: Retired    Comment: Dispensing optician  Tobacco Use   Smoking status: Never   Smokeless tobacco: Never  Vaping Use   Vaping Use: Never used  Substance and Sexual Activity   Alcohol use: No   Drug use: No   Sexual activity: Yes    Birth control/protection: Surgical, Post-menopausal  Other Topics Concern   Not on file  Social History Narrative   Not on file   Social Determinants of Health   Financial Resource Strain: [place]  (09/01/2021)   Overall Emergency planning/management officer Strain (  CARDIA)    Difficulty of Paying Living Expenses: Not hard at all  Food Insecurity: No Food Insecurity (09/01/2021)   Hunger Vital Sign    Worried About Running Out of Food in the Last Year: Never true    Ran Out of Food in the Last Year: Never true  Transportation Needs: No Transportation Needs (09/01/2021)   PRAPARE - Hydrologist (Medical): No    Lack of Transportation (Non-Medical): No  Physical Activity: Insufficiently Active (09/01/2021)   Exercise Vital Sign    Days of Exercise per Week: 2 days    Minutes of Exercise per Session: 20 min  Stress: No Stress Concern Present (09/01/2021)   [place]    Feeling of Stress : Not at all  Social Connections: Moderately Isolated (09/01/2021)   Social Connection and Isolation Panel [NHANES]    Frequency of Communication with Friends and Family: Three times a week    Frequency of Social Gatherings with Friends and Family: Three times a week    Attends Religious Services: Never    Active Member of Clubs or Organizations: No    Attends Archivist Meetings: Never    Marital Status: Married    Current Medications:  Current Outpatient Medications:    acetaminophen (TYLENOL) 325 MG tablet, Take 650 mg by mouth every 6 (six) hours as needed., Disp: , Rfl:    aspirin EC 81 MG tablet, Take 81 mg by mouth daily. Swallow whole., Disp: , Rfl:    b complex  vitamins capsule, Take 1 capsule by mouth daily. Patient unsure of dose, Disp: , Rfl:    Cholecalciferol (VITAMIN D) 2000 UNITS CAPS, Take 2,000 Units by mouth daily. , Disp: , Rfl:    lidocaine-prilocaine (EMLA) cream, Apply to affected area once, Disp: 30 g, Rfl: 3   loratadine (CLARITIN) 10 MG tablet, Take 10 mg by mouth daily as needed for allergies., Disp: , Rfl:    losartan (COZAAR) 25 MG tablet, Take 25 mg by mouth daily., Disp: , Rfl:    metFORMIN (GLUCOPHAGE) 500 MG tablet, Take 1 tablet (500 mg total) by mouth in the morning and at bedtime., Disp: , Rfl:    Multiple Vitamins-Minerals (MULTIVITAMIN WITH MINERALS) tablet, Take 1 tablet by mouth daily., Disp: , Rfl:    omeprazole (PRILOSEC) 20 MG capsule, Take 20 mg by mouth daily before breakfast. , Disp: , Rfl:    pravastatin (PRAVACHOL) 10 MG tablet, Take 10 mg by mouth daily., Disp: , Rfl:    pregabalin (LYRICA) 25 MG capsule, Take 1 capsule (25 mg total) by mouth daily., Disp: 30 capsule, Rfl: 2   senna-docusate (SENOKOT-S) 8.6-50 MG tablet, Take 2 tablets by mouth at bedtime. For AFTER surgery, do not take if having diarrhea, Disp: 30 tablet, Rfl: 0   traMADol (ULTRAM) 50 MG tablet, Take 1 tablet (50 mg total) by mouth every 6 (six) hours as needed for severe pain. For AFTER surgery only, do not take and drive, Disp: 10 tablet, Rfl: 0  Review of Systems: + vaginal bleeding, diarrhea Denies appetite changes, fevers, chills, fatigue, unexplained weight changes. Denies hearing loss, neck lumps or masses, mouth sores, ringing in ears or voice changes. Denies cough or wheezing.  Denies shortness of breath. Denies chest pain or palpitations. Denies leg swelling. Denies abdominal distention, pain, blood in stools, constipation, nausea, vomiting, or early satiety. Denies pain with intercourse, dysuria, frequency, hematuria or incontinence. Denies hot flashes,  pelvic pain.   Denies joint pain, back pain or muscle pain/cramps. Denies  itching, rash, or wounds. Denies dizziness, headaches, numbness or seizures. Denies swollen lymph nodes or glands, denies easy bruising or bleeding. Denies anxiety, depression, confusion, or decreased concentration.  Physical Exam: BP 135/67 (BP Location: Right Arm, Patient Position: Sitting)   Pulse 78   Temp 98 F (36.7 C) (Oral)   Resp 16   Wt 252 lb 4 oz (114.4 kg)   SpO2 96%   BMI 41.34 kg/m  General: Alert, oriented, no acute distress.  HEENT: Normocephalic, atraumatic. Sclera anicteric.  Chest: Clear to auscultation bilaterally. No wheezes, rhonchi, or rales. Cardiovascular: Regular rate and rhythm, no murmurs, rubs, or gallops.  Abdomen: Obese. Normoactive bowel sounds. Soft, nondistended, nontender to palpation. No masses or hepatosplenomegaly appreciated. No palpable fluid wave.  Well-healed mini laparotomy and laparoscopic incisions. Extremities: Grossly normal range of motion. Warm, well perfused.  Trace edema bilaterally.  Skin: No rashes or lesions.  Lymphatics: No cervical, supraclavicular, or inguinal adenopathy.  GU:      Normal external female genitalia. No lesions. No discharge or bleeding.             Bladder/urethra:  No lesions or masses, well supported bladder.             Vagina: Mildly atrophic vaginal mucosa.  Rectocele noted.  Minimal blood at the upper vaginal vault.             Cervix: Normal appearing, no lesions.             Uterus: Small, mobile, no parametrial involvement or nodularity.             Adnexa: Unable to appreciate any adnexal masses.  Endometrial biopsy procedure Preop diagnosis: Thickened endometrial lining Postoperative diagnosis: Same as above Physician: Berline Lopes MD Specimen: Endometrial biopsy Estimated blood loss: Minimal Procedure in detail: The procedure was discussed with the patient's including risks, benefits, and alternatives.  After this discussion, the patient gave verbal consent.  She was placed in dorsal lithotomy position  and a speculum was placed in the vagina.  Once the cervix was well visualized, it was cleansed with Betadine x3.  A single-tooth tenaculum was then placed on the anterior lip of the cervix.  Endometrial Pipelle was attempted to cannulate the os but stenosis noted. An os finder was then used to cannulate the cervical os.  Endometrial Pipelle was then advanced to 9 cm.  2 passes were performed with scant tissue obtained.  This was placed in formalin to be sent to pathology.  All instruments were removed after hemostasis of the tenaculum sites was assured.  Patient tolerated the procedure well.  Laboratory & Radiologic Studies: PET on 7/12: IMPRESSION: 1. LEFT ovary is increased in size and is intensely hypermetabolic. While physiologic hypermetabolic ovarian tissue is not uncommon, the enlargement and asymmetric activity warrants further evaluation. Consider contrast pelvic MRI vs tissue sampling. 2. No evidence of metastatic colorectal carcinoma otherwise. 3. Post RIGHT hemicolectomy anatomy. 4. Evidence of radiation change in the LEFT upper lobe (remote breast cancer).  MRI pelvis 7/27: IMPRESSION: 3.9 cm complex cystic and solid mass in the left adnexa. This corresponds to the hypermetabolic lesion on recent PET-CT, and is highly suspicious for malignancy. Differential diagnosis includes primary ovarian carcinoma and metastatic disease.   Diffuse endometrial thickening measuring up to 15 mm, highly suspicious for endometrial carcinoma in a postmenopausal female. Probable superficial (less than 50%) myometrial invasion in the fundal region.  Endometrial tissue sampling is recommended.  Assessment & Plan: Kim Castro is a 76 y.o. woman with personal history of breast and colon cancer who developed thickened endometrium on surveillance imaging last year (biopsy was benign at the time) now with a new 4 cm complex adnexal mass that is FDG avid on PET scan with increasing CEA.  MRI with  findings of thickened endometrium and concern for possible myometrial invasion.  Discussed imaging findings with the patient today.  Recommended, in the setting of her postmenopausal spotting as well as thickened endometrium on recent MRI, that endometrial biopsy be repeated today.  This was performed without difficulty.  I will call the patient with the results.  If biopsy shows endometrial hyperplasia or malignancy, we discussed proceeding with surgical staging which would include total hysterectomy, bilateral salpingo-oophorectomy, and lymph node assessment.  If biopsy is nondiagnostic or benign, then I would recommend additional endometrial sampling with hysteroscopy and D&C at the start of surgery for her FDG avid adnexal mass.  I would plan to send the tissue for frozen section and make decisions about additional procedures on the day of surgery based on results.  With her increasing CEA, my suspicion is that the adnexal mass may represent recurrent colon cancer.  Other possibilities are that it represents a primary ovarian cancer or metastatic disease if the patient is found to have endometrial cancer.  If no endometrial cancer diagnosed preoperatively, then plan will be for robotic BSO and sending the adnexal mass for frozen section.  If pathology is consistent with colon cancer metastasis, then no additional procedures would be indicated.  If ovarian cancer suspected, then we discussed additional staging procedures including total hysterectomy, lymph node sampling, omentectomy, and peritoneal biopsies.  If endometrial cancer is found preoperatively, we discussed that lymph node assessment would be performed using the sentinel lymph node biopsy technique. Risks and benefits of sentinel lymph node biopsy was reviewed. We reviewed the technique and ICG dye. The patient DOES NOT have an iodine allergy or known liver dysfunction. We reviewed the false negative rate (0.4%), and that 3% of patients with  metastatic disease will not have it detected by SLN biopsy in endometrial cancer. A low risk of allergic reaction to the dye, <0.2% for ICG, has been reported. We also discussed that in the case of failed mapping, which occurs 40% of the time, a bilateral or unilateral lymphadenectomy will be performed at the surgeon's discretion.   Potential benefits of sentinel nodes including a higher detection rate for metastasis due to ultrastaging and potential reduction in operative morbidity. However, there remains uncertainty as to the role for treatment of micrometastatic disease. Further, the benefit of operative morbidity associated with the SLN technique in endometrial cancer is not yet completely known. In other patient populations (e.g. the cervical cancer population) there has been observed reductions in morbidity with SLN biopsy compared to pelvic lymphadenectomy. Lymphedema, nerve dysfunction and lymphocysts are all potential risks with the SLN technique as with complete lymphadenectomy. Additional risks to the patient include the risk of damage to an internal organ while operating in an altered view (e.g. the black and white image of the robotic fluorescence imaging mode).   We made the tentative plan for a robotic bilateral salpingo-oophorectomy, possible hysteroscopy with endometrial sampling, possible robotic total hysterectomy, possible staging including lymph node assessment, possible laparotomy. The risks of surgery were discussed in detail and she understands these to include infection; wound separation; hernia; vaginal cuff separation, injury to adjacent organs  such as bowel, bladder, blood vessels, ureters and nerves; bleeding which may require blood transfusion; anesthesia risk; thromboembolic events; possible death; unforeseen complications; possible need for re-exploration; medical complications such as heart attack, stroke, pleural effusion and pneumonia; and, if full lymphadenectomy is performed  the risk of lymphedema and lymphocyst. The patient will receive DVT and antibiotic prophylaxis as indicated. She voiced a clear understanding. She had the opportunity to ask questions. Perioperative instructions were reviewed with her. Prescriptions for post-op medications were sent to her pharmacy of choice.  I will call the patient with her biopsy results later this week and finalize plan for the day of surgery.  CA-125 was drawn today and is pending.  38 minutes of total time was spent for this patient encounter, including preparation, face-to-face counseling with the patient and coordination of care, and documentation of the encounter.  Jeral Pinch, MD  Division of Gynecologic Oncology  Department of Obstetrics and Gynecology  [facility]

## 2021-11-29 NOTE — Progress Notes (Signed)
Gynecologic Oncology Return Clinic Visit  11/29/21  Reason for Visit: Follow-up visit in the setting of abnormal endometrial on imaging as well as adnexal mass that is FDG avid in the setting of increasing CEA  Treatment History: Oncology History Overview Note  Cancer Staging Malignant neoplasm of female breast [facility]) Staging form: Breast, AJCC 7th Edition - Clinical stage from 06/11/2014: Stage 0 (Tis (DCIS), N0, M0) - Unsigned Staged by: Pathologist and managing physician Laterality: Left Estrogen receptor status: Positive Progesterone receptor status: Positive Stage used in treatment planning: Yes National guidelines used in treatment planning: Yes Type of national guideline used in treatment planning: NCCN - Pathologic stage from 07/03/2014: Stage Unknown (Tis (DCIS), NX, cM0) - Signed by Enid Cutter, MD on 07/10/2014 Staged by: Pathologist Laterality: Left Estrogen receptor status: Positive Progesterone receptor status: Positive Stage used in treatment planning: Yes National guidelines used in treatment planning: Yes Type of national guideline used in treatment planning: NCCN Staging comments: Staged on final lumpectomy specimen by Dr. Donato Heinz.  right colon cancer Staging form: Colon and Rectum, AJCC 8th Edition - Pathologic stage from 11/01/2019: Stage IIIB (pT3, pN2a, cM0) - Signed by Truitt Merle, MD on 11/29/2019 Stage prefix: Initial diagnosis Histologic grading system: 4 grade system Histologic grade (G): G2 Residual tumor (R): R0 - None Tumor deposits (TD): Absent Perineural invasion (PNI): Absent Microsatellite instability (MSI): Stable KRAS mutation: Unknown NRAS mutation: Unknown BRAF Mutation: Unknown    Malignant neoplasm of female breast ([place])  05/29/2014 Initial Biopsy   Left breast needle core biopsy: Grade 2, DCIS with calcs. ER+ (100%), PR+ (96%).    06/04/2014 Initial Diagnosis   Left breast DCIS with calcifications, ER 100%, PR 96%   06/10/2014 Breast MRI    Left breast: 2.4 x 1.3 x 1.1 cm area of patchy non-mass enhancement upper outer quadrant includes postbiopsy seroma; Right breast: 1.2 cm previously biopsied stable benign fibroadenoma   06/12/2014 Procedure   Genetic counseling/testing: Identified 1 VUS on CHEK2 gene. Remainder of 17 gene panel tested negative and included: ATM, BARD1, BRCA 1/2, BRIP1, CDH1, CHEK2, EPCAM, MLH1, MSH2, MSH6, NBN, NF1, PALB2, PTEN, RAD50, RAD51C, RAD51D, STK11, and TP53.    07/01/2014 Surgery   Left breast lumpectomy ([facility]): Grade 1, DCIS, spanning 2.3 cm, 1 mm margin, ER 100%, PR 96%   07/31/2014 - 08/28/2014 Radiation Therapy   Adjuvant RT completed [facility]). Left breast: Total dose 42.5 Gy over 17 fractions. Left breast boost: Total dose 7.5 Gy over 3 fractions.    09/14/2014 - 01/13/2020 Anti-estrogen oral therapy   Anastrazole 29m daily. Planned duration of treatment: 5 years ([place]. Completed in 01/2020.    09/25/2014 Survivorship   Survivorship Care Plan given to patient and reviewed with her in person.    03/02/2021 Imaging   CT CAP  IMPRESSION: 1. No findings of active/recurrent malignancy. Partial right hemicolectomy. 2. Endometrial stripe remains mildly thickened, but endometrial biopsy in May was negative for malignancy. 3. Progressive endplate sclerosis and endplate irregularity at T2-3, probably due to degenerative endplate findings. If the has referable upper thoracic pain/symptoms then thoracic spine MRI could be used for further workup. 4. Other imaging findings of potential clinical significance: Mild cardiomegaly. Aortic Atherosclerosis (ICD10-I70.0). Mild mitral valve calcification. Postoperative findings in the left breast with adjacent radiation port anteriorly in the left upper lobe. Tiny pulmonary nodules in the left lower lobe are unchanged from earliest available comparison of 10/17/2019 and probably benign although may merit surveillance. Multilevel lumbar impingement. Mild  pelvic floor laxity.  Right colon cancer  09/19/2019 Imaging   CT AP W contrast 09/19/19  IMPRESSION Fullness in the cecum, cannot exclude a mass. No evidence for metastatic disease is identified.    09/24/2019 Procedure   Colonoscopy by Dr Eber Jones 09/24/19 IMPRESSION 1. The colon was redundant  2. Mild diverticulosis was noted through the entire examined colon 3. Single 34m polyp was found in the ascending colon; polypectomy was performed using snare cautery and biopsy forceps 4. Mild diverticulosis was notes in the descending colon and sigmoid colon.  5. Single polyp was found in the sigmoid colon, polypectomy was performed with cold forceps.  6. Single polyp was found in the rectosigmoid colon; polypectomy was performed with cold snare  7. Small internal hemorrhoids  8. Large mass was found at the cecum; multiple biopsies of the area were performed using cold forceps; injection (tattooing) was performed distal to the mass.    09/24/2019 Initial Biopsy   INTERPRETATION AND DIAGNOSIS:  A. Cecum, biopsy:  Invasive moderately differentiated adenocarcinoma.  see comment  B. Polyp @ ascending colon, polypectomy:  Tubular Adenoma  C. Polyp @ sigmoid colon Polypectomy:  hyperplastic polyp.  D. Polyp @ rectosigmoid colon, Polypectomy:  Hyperplastic Polyp      10/16/2019 Imaging   CT Chest IMPRESSION: 1. Multiple small pulmonary nodules measuring 5 mm or less in size in the lungs. These are nonspecific and are typically considered statistically likely benign. However, given the patient's history of primary malignancy, close attention on follow-up studies is recommended to ensure stability. 2. Aortic atherosclerosis, in addition to right coronary artery disease. Assessment for potential risk factor modification, dietary therapy or pharmacologic therapy may be warranted, if clinically indicated. 3. There are calcifications of the aortic valve and mitral annulus. Echocardiographic  correlation for evaluation of potential valvular dysfunction may be warranted if clinically indicated. 4. Small hiatal hernia.   Aortic Atherosclerosis (ICD10-I70.0).   11/01/2019 Initial Diagnosis   Colon cancer ([place]   11/01/2019 Surgery   LAPAROSCOPIC PARTIAL COLECTOMY by Dr TMarcello Mooresand Dr GJohney Maine  11/01/2019 Pathology Results   FINAL MICROSCOPIC DIAGNOSIS:   A. COLON, PROXIMAL RIGHT, COLECTOMY:  - Invasive colonic adenocarcinoma, 5 cm.  - Tumor invades through the muscularis propria into pericolonic tissues.   - Margins of resection are not involved.  - Metastatic carcinoma in (5) of (13) lymph nodes.  - See oncology table.    MSI Stable  Mismatch repair normal  MLH1 - Preserved nuclear expression (greater 50% tumor expression) MSH2 - Preserved nuclear expression (greater 50% tumor expression) MSH6 - Preserved nuclear expression (greater 50% tumor expression) PMS2 - Preserved nuclear expression (greater 50% tumor expression)   11/01/2019 Cancer Staging   Staging form: Colon and Rectum, AJCC 8th Edition - Pathologic stage from 11/01/2019: Stage IIIB (pT3, pN2a, cM0) - Signed by FTruitt Merle MD on 11/29/2019   12/10/2019 Procedure   PAC placed 12/10/19   12/17/2019 - 06/01/2020 Chemotherapy   FOLFOX q2weeks starting in 2 weeks starting 12/17/19. Held 01/27/20-02/10/20 due to b/l PE. Oxaliplatin held C11-12 due to neuropathy. Completed on 06/01/20   03/02/2021 Imaging   CT CAP  IMPRESSION: 1. No findings of active/recurrent malignancy. Partial right hemicolectomy. 2. Endometrial stripe remains mildly thickened, but endometrial biopsy in May was negative for malignancy. 3. Progressive endplate sclerosis and endplate irregularity at T2-3, probably due to degenerative endplate findings. If the has referable upper thoracic pain/symptoms then thoracic spine MRI could be used for further workup. 4. Other imaging findings of potential clinical  significance: Mild cardiomegaly. Aortic  Atherosclerosis (ICD10-I70.0). Mild mitral valve calcification. Postoperative findings in the left breast with adjacent radiation port anteriorly in the left upper lobe. Tiny pulmonary nodules in the left lower lobe are unchanged from earliest available comparison of 10/17/2019 and probably benign although may merit surveillance. Multilevel lumbar impingement. Mild pelvic floor laxity.   08/26/2021 Imaging   EXAM: CT CHEST, ABDOMEN, AND PELVIS WITH CONTRAST  IMPRESSION: 1. Stable examination without new or progressive findings to suggest local recurrence or metastatic disease within the chest, abdomen, or pelvis. 2. Hepatomegaly with hepatic steatosis. 3. Sigmoid colonic diverticulosis without findings of acute diverticulitis. 4. Similar prominent endplate sclerosis and irregularity at T2-T3 is most consistent with Modic type endplate degenerative changes. However, if patient has referable upper thoracic pain consider further workup with thoracic spine MRI. 5. Similar thickening of the endometrial stripe measuring up to 8 mm, which was previously biopsied with results negative for malignancy. 6. Aortic Atherosclerosis (ICD10-I70.0).   11/10/2021 PET scan   IMPRESSION: 1. LEFT ovary is increased in size and is intensely hypermetabolic. While physiologic hypermetabolic ovarian tissue is not uncommon, the enlargement and asymmetric activity warrants further evaluation. Consider contrast pelvic MRI vs tissue sampling. 2. No evidence of metastatic colorectal carcinoma otherwise. 3. Post RIGHT hemicolectomy anatomy. 4. Evidence of radiation change in the LEFT upper lobe (remote breast cancer).      omponent Ref Range & Units 1 mo ago (10/25/21) 3 mo ago (08/26/21) 9 mo ago (03/02/21) 12 mo ago (11/30/20) 1 yr ago (08/26/20) 1 yr ago (03/23/20) 1 yr ago (12/30/19)  CEA ([facility]) 0.00 - 5.00 ng/mL 9.83 High   6.99 High  CM  4.17 CM  3.41 CM  2.45 CM  5.24 High  CM  4.56 CM      Interval History: Patient reports overall doing well.  She has had some very light vaginal spotting for approximately 1 week, which she describes as seeing a spot here and there.  She has occasional "aching" in her pelvis.  She denies any cramping or significant abdominal pain.  She endorses continued loose stools, no change recently.  Bladder function is at baseline.  She endorses a good appetite without nausea or emesis.  Past Medical/Surgical History: Past Medical History:  Diagnosis Date   Aortic atherosclerosis (HCC)    Arthritis    feet, lower back   Basal cell carcinoma    arm   Breast cancer of upper-outer quadrant of left female breast ([place]) 06/04/2014   Cataract    immature on the left   Colon cancer ([place]) 08/2019   Diabetes mellitus without complication (HCC)    Diverticulosis    Dizziness    > 58yr ago;took Antivert    Family history of anesthesia complication    sister slow to wake up with anesthesia   Family history of breast cancer    Family history of colon cancer    Family history of uterine cancer    GERD (gastroesophageal reflux disease)    takes occasional TUMs   History of bronchitis    > 89[place]ago   History of colon polyps    History of hiatal hernia    Small noted on CT   History of pulmonary embolus (PE)    Hypertension    takes Losartan daily and HCTZ   Iron deficiency anemia    Joint pain    Numbness    to toes on each foot   Peripheral neuropathy  feet and toes   Personal history of radiation therapy    Pulmonary nodules    Noted on CT   Radiation 07/31/14-08/28/14   Left Breast 20 fxs   Seasonal allergies    takes Claritin prn   Urinary frequency    Vitamin D deficiency    takes VIt D daily    Past Surgical History:  Procedure Laterality Date   BREAST BIOPSY Bilateral    BREAST LUMPECTOMY Left    BREAST LUMPECTOMY WITH RADIOACTIVE SEED LOCALIZATION Left 07/01/2014   Procedure: LEFT BREAST LUMPECTOMY WITH RADIOACTIVE SEED  LOCALIZATION;  Surgeon: Excell Seltzer, MD;  Location: [facility];  Service: General;  Laterality: Left;   CATARACT EXTRACTION Right    COLONOSCOPY  09/24/2019   [place]   LAPAROSCOPIC PARTIAL COLECTOMY N/A 11/01/2019   Procedure: LAPAROSCOPIC PARTIAL COLECTOMY;  Surgeon: Leighton Ruff, MD;  Location: [facility];  Service: General;  Laterality: N/A;   POLYPECTOMY     PORTACATH PLACEMENT N/A 12/10/2019   Procedure: INSERTION PORT-A-CATH ULTRASOUND GUIDED IN RIGHT IJ;  Surgeon: Leighton Ruff, MD;  Location: [facility];  Service: General;  Laterality: N/A;   TOTAL KNEE ARTHROPLASTY Left 10/24/2012   Procedure: TOTAL KNEE ARTHROPLASTY;  Surgeon: Kerin Salen, MD;  Location: [facility];  Service: Orthopedics;  Laterality: Left;   TOTAL KNEE ARTHROPLASTY Right 01/09/2013   Procedure: TOTAL KNEE ARTHROPLASTY;  Surgeon: Kerin Salen, MD;  Location: [facility];  Service: Orthopedics;  Laterality: Right;   TUBAL LIGATION      Family History  Problem Relation Age of Onset   Diabetes Mother    Uterine cancer Mother        deceased 8   Hypertension Father    Heart disease Father    Diabetes Sister    Breast cancer Sister 48       currently 73   Hypertension Sister    Diabetes Sister    Breast cancer Sister    Diabetes Sister    Kidney disease Sister    Heart disease Sister    Lupus Sister    Heart disease Sister    Diabetes Sister    Diabetes Sister    Hypertension Brother    Diabetes Brother    Colon cancer Brother 5   Colon polyps Daughter    Thyroid cancer Daughter 37       currently 80; type?   Cancer Maternal Uncle        unk. primary; deceased 4s   Stroke Maternal Grandmother    Esophageal cancer Neg Hx    Rectal cancer Neg Hx    Stomach cancer Neg Hx     Social History   Socioeconomic History   Marital status: Married    Spouse name: Not on file   Number of children: 3   Years of education: Not on file   Highest education level: Not on file  Occupational  History   Occupation: Retired    Comment: Dispensing optician  Tobacco Use   Smoking status: Never   Smokeless tobacco: Never  Vaping Use   Vaping Use: Never used  Substance and Sexual Activity   Alcohol use: No   Drug use: No   Sexual activity: Yes    Birth control/protection: Surgical, Post-menopausal  Other Topics Concern   Not on file  Social History Narrative   Not on file   Social Determinants of Health   Financial Resource Strain: [place]  (09/01/2021)   Overall Emergency planning/management officer Strain (  CARDIA)    Difficulty of Paying Living Expenses: Not hard at all  Food Insecurity: No Food Insecurity (09/01/2021)   Hunger Vital Sign    Worried About Running Out of Food in the Last Year: Never true    Ran Out of Food in the Last Year: Never true  Transportation Needs: No Transportation Needs (09/01/2021)   PRAPARE - Hydrologist (Medical): No    Lack of Transportation (Non-Medical): No  Physical Activity: Insufficiently Active (09/01/2021)   Exercise Vital Sign    Days of Exercise per Week: 2 days    Minutes of Exercise per Session: 20 min  Stress: No Stress Concern Present (09/01/2021)   [place]    Feeling of Stress : Not at all  Social Connections: Moderately Isolated (09/01/2021)   Social Connection and Isolation Panel [NHANES]    Frequency of Communication with Friends and Family: Three times a week    Frequency of Social Gatherings with Friends and Family: Three times a week    Attends Religious Services: Never    Active Member of Clubs or Organizations: No    Attends Archivist Meetings: Never    Marital Status: Married    Current Medications:  Current Outpatient Medications:    acetaminophen (TYLENOL) 325 MG tablet, Take 650 mg by mouth every 6 (six) hours as needed., Disp: , Rfl:    aspirin EC 81 MG tablet, Take 81 mg by mouth daily. Swallow whole., Disp: , Rfl:    b complex  vitamins capsule, Take 1 capsule by mouth daily. Patient unsure of dose, Disp: , Rfl:    Cholecalciferol (VITAMIN D) 2000 UNITS CAPS, Take 2,000 Units by mouth daily. , Disp: , Rfl:    lidocaine-prilocaine (EMLA) cream, Apply to affected area once, Disp: 30 g, Rfl: 3   loratadine (CLARITIN) 10 MG tablet, Take 10 mg by mouth daily as needed for allergies., Disp: , Rfl:    losartan (COZAAR) 25 MG tablet, Take 25 mg by mouth daily., Disp: , Rfl:    metFORMIN (GLUCOPHAGE) 500 MG tablet, Take 1 tablet (500 mg total) by mouth in the morning and at bedtime., Disp: , Rfl:    Multiple Vitamins-Minerals (MULTIVITAMIN WITH MINERALS) tablet, Take 1 tablet by mouth daily., Disp: , Rfl:    omeprazole (PRILOSEC) 20 MG capsule, Take 20 mg by mouth daily before breakfast. , Disp: , Rfl:    pravastatin (PRAVACHOL) 10 MG tablet, Take 10 mg by mouth daily., Disp: , Rfl:    pregabalin (LYRICA) 25 MG capsule, Take 1 capsule (25 mg total) by mouth daily., Disp: 30 capsule, Rfl: 2   senna-docusate (SENOKOT-S) 8.6-50 MG tablet, Take 2 tablets by mouth at bedtime. For AFTER surgery, do not take if having diarrhea, Disp: 30 tablet, Rfl: 0   traMADol (ULTRAM) 50 MG tablet, Take 1 tablet (50 mg total) by mouth every 6 (six) hours as needed for severe pain. For AFTER surgery only, do not take and drive, Disp: 10 tablet, Rfl: 0  Review of Systems: + vaginal bleeding, diarrhea Denies appetite changes, fevers, chills, fatigue, unexplained weight changes. Denies hearing loss, neck lumps or masses, mouth sores, ringing in ears or voice changes. Denies cough or wheezing.  Denies shortness of breath. Denies chest pain or palpitations. Denies leg swelling. Denies abdominal distention, pain, blood in stools, constipation, nausea, vomiting, or early satiety. Denies pain with intercourse, dysuria, frequency, hematuria or incontinence. Denies hot flashes,  pelvic pain.   Denies joint pain, back pain or muscle pain/cramps. Denies  itching, rash, or wounds. Denies dizziness, headaches, numbness or seizures. Denies swollen lymph nodes or glands, denies easy bruising or bleeding. Denies anxiety, depression, confusion, or decreased concentration.  Physical Exam: BP 135/67 (BP Location: Right Arm, Patient Position: Sitting)   Pulse 78   Temp 98 F (36.7 C) (Oral)   Resp 16   Wt 252 lb 4 oz (114.4 kg)   SpO2 96%   BMI 41.34 kg/m  General: Alert, oriented, no acute distress.  HEENT: Normocephalic, atraumatic. Sclera anicteric.  Chest: Clear to auscultation bilaterally. No wheezes, rhonchi, or rales. Cardiovascular: Regular rate and rhythm, no murmurs, rubs, or gallops.  Abdomen: Obese. Normoactive bowel sounds. Soft, nondistended, nontender to palpation. No masses or hepatosplenomegaly appreciated. No palpable fluid wave.  Well-healed mini laparotomy and laparoscopic incisions. Extremities: Grossly normal range of motion. Warm, well perfused.  Trace edema bilaterally.  Skin: No rashes or lesions.  Lymphatics: No cervical, supraclavicular, or inguinal adenopathy.  GU:      Normal external female genitalia. No lesions. No discharge or bleeding.             Bladder/urethra:  No lesions or masses, well supported bladder.             Vagina: Mildly atrophic vaginal mucosa.  Rectocele noted.  Minimal blood at the upper vaginal vault.             Cervix: Normal appearing, no lesions.             Uterus: Small, mobile, no parametrial involvement or nodularity.             Adnexa: Unable to appreciate any adnexal masses.  Endometrial biopsy procedure Preop diagnosis: Thickened endometrial lining Postoperative diagnosis: Same as above Physician: Berline Lopes MD Specimen: Endometrial biopsy Estimated blood loss: Minimal Procedure in detail: The procedure was discussed with the patient's including risks, benefits, and alternatives.  After this discussion, the patient gave verbal consent.  She was placed in dorsal lithotomy position  and a speculum was placed in the vagina.  Once the cervix was well visualized, it was cleansed with Betadine x3.  A single-tooth tenaculum was then placed on the anterior lip of the cervix.  Endometrial Pipelle was attempted to cannulate the os but stenosis noted. An os finder was then used to cannulate the cervical os.  Endometrial Pipelle was then advanced to 9 cm.  2 passes were performed with scant tissue obtained.  This was placed in formalin to be sent to pathology.  All instruments were removed after hemostasis of the tenaculum sites was assured.  Patient tolerated the procedure well.  Laboratory & Radiologic Studies: PET on 7/12: IMPRESSION: 1. LEFT ovary is increased in size and is intensely hypermetabolic. While physiologic hypermetabolic ovarian tissue is not uncommon, the enlargement and asymmetric activity warrants further evaluation. Consider contrast pelvic MRI vs tissue sampling. 2. No evidence of metastatic colorectal carcinoma otherwise. 3. Post RIGHT hemicolectomy anatomy. 4. Evidence of radiation change in the LEFT upper lobe (remote breast cancer).  MRI pelvis 7/27: IMPRESSION: 3.9 cm complex cystic and solid mass in the left adnexa. This corresponds to the hypermetabolic lesion on recent PET-CT, and is highly suspicious for malignancy. Differential diagnosis includes primary ovarian carcinoma and metastatic disease.   Diffuse endometrial thickening measuring up to 15 mm, highly suspicious for endometrial carcinoma in a postmenopausal female. Probable superficial (less than 50%) myometrial invasion in the fundal region.  Endometrial tissue sampling is recommended.  Assessment & Plan: Kim Castro is a 76 y.o. woman with personal history of breast and colon cancer who developed thickened endometrium on surveillance imaging last year (biopsy was benign at the time) now with a new 4 cm complex adnexal mass that is FDG avid on PET scan with increasing CEA.  MRI with  findings of thickened endometrium and concern for possible myometrial invasion.  Discussed imaging findings with the patient today.  Recommended, in the setting of her postmenopausal spotting as well as thickened endometrium on recent MRI, that endometrial biopsy be repeated today.  This was performed without difficulty.  I will call the patient with the results.  If biopsy shows endometrial hyperplasia or malignancy, we discussed proceeding with surgical staging which would include total hysterectomy, bilateral salpingo-oophorectomy, and lymph node assessment.  If biopsy is nondiagnostic or benign, then I would recommend additional endometrial sampling with hysteroscopy and D&C at the start of surgery for her FDG avid adnexal mass.  I would plan to send the tissue for frozen section and make decisions about additional procedures on the day of surgery based on results.  With her increasing CEA, my suspicion is that the adnexal mass may represent recurrent colon cancer.  Other possibilities are that it represents a primary ovarian cancer or metastatic disease if the patient is found to have endometrial cancer.  If no endometrial cancer diagnosed preoperatively, then plan will be for robotic BSO and sending the adnexal mass for frozen section.  If pathology is consistent with colon cancer metastasis, then no additional procedures would be indicated.  If ovarian cancer suspected, then we discussed additional staging procedures including total hysterectomy, lymph node sampling, omentectomy, and peritoneal biopsies.  If endometrial cancer is found preoperatively, we discussed that lymph node assessment would be performed using the sentinel lymph node biopsy technique. Risks and benefits of sentinel lymph node biopsy was reviewed. We reviewed the technique and ICG dye. The patient DOES NOT have an iodine allergy or known liver dysfunction. We reviewed the false negative rate (0.4%), and that 3% of patients with  metastatic disease will not have it detected by SLN biopsy in endometrial cancer. A low risk of allergic reaction to the dye, <0.2% for ICG, has been reported. We also discussed that in the case of failed mapping, which occurs 40% of the time, a bilateral or unilateral lymphadenectomy will be performed at the surgeon's discretion.   Potential benefits of sentinel nodes including a higher detection rate for metastasis due to ultrastaging and potential reduction in operative morbidity. However, there remains uncertainty as to the role for treatment of micrometastatic disease. Further, the benefit of operative morbidity associated with the SLN technique in endometrial cancer is not yet completely known. In other patient populations (e.g. the cervical cancer population) there has been observed reductions in morbidity with SLN biopsy compared to pelvic lymphadenectomy. Lymphedema, nerve dysfunction and lymphocysts are all potential risks with the SLN technique as with complete lymphadenectomy. Additional risks to the patient include the risk of damage to an internal organ while operating in an altered view (e.g. the black and white image of the robotic fluorescence imaging mode).   We made the tentative plan for a robotic bilateral salpingo-oophorectomy, possible hysteroscopy with endometrial sampling, possible robotic total hysterectomy, possible staging including lymph node assessment, possible laparotomy. The risks of surgery were discussed in detail and she understands these to include infection; wound separation; hernia; vaginal cuff separation, injury to adjacent organs  such as bowel, bladder, blood vessels, ureters and nerves; bleeding which may require blood transfusion; anesthesia risk; thromboembolic events; possible death; unforeseen complications; possible need for re-exploration; medical complications such as heart attack, stroke, pleural effusion and pneumonia; and, if full lymphadenectomy is performed  the risk of lymphedema and lymphocyst. The patient will receive DVT and antibiotic prophylaxis as indicated. She voiced a clear understanding. She had the opportunity to ask questions. Perioperative instructions were reviewed with her. Prescriptions for post-op medications were sent to her pharmacy of choice.  I will call the patient with her biopsy results later this week and finalize plan for the day of surgery.  CA-125 was drawn today and is pending.  38 minutes of total time was spent for this patient encounter, including preparation, face-to-face counseling with the patient and coordination of care, and documentation of the encounter.  Jeral Pinch, MD  Division of Gynecologic Oncology  Department of Obstetrics and Gynecology  [facility]

## 2021-11-29 NOTE — Telephone Encounter (Signed)
Caller Name: Ciella Obi Call back phone #: [phone]  Reason for Call: Pt asked that we send the pharmacy a request for metformin, prouastatin, losartan. Pharmacy needed for Dr. Gena Fray to send new prescription since she changed PCP. [facility] ([place], [place], [place] [address])

## 2021-11-29 NOTE — Progress Notes (Signed)
Patient here for follow up with Dr. Jeral Pinch and for a pre-operative discussion prior to her scheduled surgery on December 22, 2021. She is scheduled for robotic assisted laparoscopic bilateral salpingo-oophorectomy, possible robotic assisted total laparoscopic hysterectomy, possible dilation and curettage of the uterus, possible staging if a cancer is identified, possible laparotomy. The surgery was discussed in detail.  See after visit summary for additional details. Visual aids used to discuss items related to surgery including sequential compression stockings, foley catheter, IV pump, multi-modal pain regimen including tylenol, photo of the surgical robot, female reproductive system to discuss surgery in detail.      Discussed post-op pain management in detail including the aspects of the enhanced recovery pathway.  Advised her that a new prescription would be sent in for tramadol and it is only to be used for after her upcoming surgery.  We discussed the use of tylenol post-op and to monitor for a maximum of 4,000 mg in a 24 hour period.  Also prescribed sennakot to be used after surgery and to hold if having loose stools.  Discussed bowel regimen in detail.     Discussed the use of SCDs and measures to take at home to prevent DVT including frequent mobility.  Reportable signs and symptoms of DVT discussed. Post-operative instructions discussed and expectations for after surgery. Incisional care discussed as well including reportable signs and symptoms including erythema, drainage, wound separation.     5 minutes spent with the patient.  Verbalizing understanding of material discussed. No needs or concerns voiced at the end of the visit.   Advised patient to call for any needs.  Advised that her post-operative medications had been prescribed and could be picked up at any time. She will be contacted with the results of her endometrial biopsy from today.  This appointment is included in the global  surgical bundle as pre-operative teaching and has no charge.

## 2021-11-29 NOTE — Patient Instructions (Addendum)
Dr. Berline Lopes will contact you with the results of your endometrial biopsy from today.  Preparing for your Surgery  Plan for surgery on December 22, 2021 with Dr. Jeral Pinch at [facility] will be scheduled for robotic assisted laparoscopic bilateral salpingo-oophorectomy (removal of both ovaries and fallopian tubes), possible robotic assisted total laparoscopic hysterectomy (removal of the uterus and cervix), possible dilation and curettage of the uterus, possible staging if a cancer is identified, possible laparotomy (larger incision on your abdomen if needed).   Pre-operative Testing -You will receive a phone call from presurgical testing at [facility] to arrange for a pre-operative appointment and lab work.  -Bring your insurance card, copy of an advanced directive if applicable, medication list  -At that visit, you will be asked to sign a consent for a possible blood transfusion in case a transfusion becomes necessary during surgery.  The need for a blood transfusion is rare but having consent is a necessary part of your care.     -You can continue taking your baby aspirin (81 mg) with your last dose being the DAY BEFORE surgery in the am.  -Do not take supplements such as fish oil (omega 3), red yeast rice, turmeric before your surgery. You want to avoid medications with aspirin in them including headache powders such as BC or Goody's), Excedrin migraine.  Day Before Surgery at [facility] will be asked to take in a light diet the day before surgery. You will be advised you can have clear liquids up until 3 hours before your surgery.    Eat a light diet the day before surgery.  Examples including soups, broths, toast, yogurt, mashed potatoes.  AVOID GAS PRODUCING FOODS. Things to avoid include carbonated beverages (fizzy beverages, sodas), raw fruits and raw vegetables (uncooked), or beans.   If your bowels are filled with gas, your surgeon will have difficulty  visualizing your pelvic organs which increases your surgical risks.  Your role in recovery Your role is to become active as soon as directed by your doctor, while still giving yourself time to heal.  Rest when you feel tired. You will be asked to do the following in order to speed your recovery:  - Cough and breathe deeply. This helps to clear and expand your lungs and can prevent pneumonia after surgery.  - [place]. Do mild physical activity. Walking or moving your legs help your circulation and body functions return to normal. Do not try to get up or walk alone the first time after surgery.   -If you develop swelling on one leg or the other, pain in the back of your leg, redness/warmth in one of your legs, please call the office or go to the Emergency Room to have a doppler to rule out a blood clot. For shortness of breath, chest pain-seek care in the Emergency Room as soon as possible. - Actively manage your pain. Managing your pain lets you move in comfort. We will ask you to rate your pain on a scale of zero to 10. It is your responsibility to tell your doctor or nurse where and how much you hurt so your pain can be treated.  Special Considerations -If you are diabetic, you may be placed on insulin after surgery to have closer control over your blood sugars to promote healing and recovery.  This does not mean that you will be discharged on insulin.  If applicable, your oral antidiabetics will be resumed when  you are tolerating a solid diet.  -Your final pathology results from surgery should be available around one week after surgery and the results will be relayed to you when available.  -FMLA forms can be faxed to [phone] and please allow 5-7 business days for completion.  Pain Management After Surgery -You have been prescribed your pain medication (tramadol) and bowel regimen medications before surgery so that you can have these available when you are discharged  from the hospital. The pain medication is for use ONLY AFTER surgery and a new prescription will not be given.   -Make sure that you have Tylenol and Ibuprofen IF YOU ARE ABLE TO TAKE THESE MEDICATIONS at home to use on a regular basis after surgery for pain control. We recommend alternating the medications every hour to six hours since they work differently and are processed in the body differently for pain relief.  -Review the attached handout on narcotic use and their risks and side effects.   Bowel Regimen -You have been prescribed Sennakot-S to take nightly to prevent constipation especially if you are taking the narcotic pain medication intermittently.  It is important to prevent constipation and drink adequate amounts of liquids. You can stop taking this medication when you are not taking pain medication and you are back on your normal bowel routine.  Risks of Surgery Risks of surgery are low but include bleeding, infection, damage to surrounding structures, re-operation, blood clots, and very rarely death.   Blood Transfusion Information (For the consent to be signed before surgery)  We will be checking your blood type before surgery so in case of emergencies, we will know what type of blood you would need.                                            WHAT IS A BLOOD TRANSFUSION?  A transfusion is the replacement of blood or some of its parts. Blood is made up of multiple cells which provide different functions. Red blood cells carry oxygen and are used for blood loss replacement. White blood cells fight against infection. Platelets control bleeding. Plasma helps clot blood. Other blood products are available for specialized needs, such as hemophilia or other clotting disorders. BEFORE THE TRANSFUSION  Who gives blood for transfusions?  You may be able to donate blood to be used at a later date on yourself (autologous donation). Relatives can be asked to donate blood. This is  generally not any safer than if you have received blood from a stranger. The same precautions are taken to ensure safety when a relative's blood is donated. Healthy volunteers who are fully evaluated to make sure their blood is safe. This is blood bank blood. Transfusion therapy is the safest it has ever been in the practice of medicine. Before blood is taken from a donor, a complete history is taken to make sure that person has no history of diseases nor engages in risky social behavior (examples are intravenous drug use or sexual activity with multiple partners). The donor's travel history is screened to minimize risk of transmitting infections, such as malaria. The donated blood is tested for signs of infectious diseases, such as HIV and hepatitis. The blood is then tested to be sure it is compatible with you in order to minimize the chance of a transfusion reaction. If you or a relative donates blood, this is often  done in anticipation of surgery and is not appropriate for emergency situations. It takes many days to process the donated blood. RISKS AND COMPLICATIONS Although transfusion therapy is very safe and saves many lives, the main dangers of transfusion include:  Getting an infectious disease. Developing a transfusion reaction. This is an allergic reaction to something in the blood you were given. Every precaution is taken to prevent this. The decision to have a blood transfusion has been considered carefully by your caregiver before blood is given. Blood is not given unless the benefits outweigh the risks.  AFTER SURGERY INSTRUCTIONS  Return to work: 4-6 weeks if applicable  Activity: 1. Be up and out of the bed during the day.  Take a nap if needed.  You may walk up steps but be careful and use the hand rail.  Stair climbing will tire you more than you think, you may need to stop part way and rest.   2. No lifting or straining for 6 weeks over 10 pounds. No pushing, pulling, straining  for 6 weeks.  3. No driving for around 1 week(s).  Do not drive if you are taking narcotic pain medicine and make sure that your reaction time has returned.   4. You can shower as soon as the next day after surgery. Shower daily.  Use your regular soap and water (not directly on the incision) and pat your incision(s) dry afterwards; don't rub.  No tub baths or submerging your body in water until cleared by your surgeon. If you have the soap that was given to you by pre-surgical testing that was used before surgery, you do not need to use it afterwards because this can irritate your incisions.   5. No sexual activity and nothing in the vagina for 4 weeks, 8 weeks if you have a hysterectomy (removal of the uterus and cervix).  6. You may experience a small amount of clear drainage from your incisions, which is normal.  If the drainage persists, increases, or changes color please call the office.  7. Do not use creams, lotions, or ointments such as neosporin on your incisions after surgery until advised by your surgeon because they can cause removal of the dermabond glue on your incisions.    8. You may experience vaginal spotting after surgery or around the 6-8 week mark from surgery when the stitches at the top of the vagina begin to dissolve (if you have a hysterectomy).  The spotting is normal but if you experience heavy bleeding, call our office.  9. Take Tylenol or ibuprofen first for pain if you are able to take these medications and only use narcotic pain medication for severe pain not relieved by the Tylenol or Ibuprofen.  Monitor your Tylenol intake to a max of 4,000 mg in a 24 hour period. You can alternate these medications after surgery.  Diet: 1. Low sodium Heart Healthy Diet is recommended but you are cleared to resume your normal (before surgery) diet after your procedure.  2. It is safe to use a laxative, such as Miralax or Colace, if you have difficulty moving your bowels. You have  been prescribed Sennakot-S to take at bedtime every evening after surgery to keep bowel movements regular and to prevent constipation.    Wound Care: 1. Keep clean and dry.  Shower daily.  Reasons to call the Doctor: Fever - Oral temperature greater than 100.4 degrees Fahrenheit Foul-smelling vaginal discharge Difficulty urinating Nausea and vomiting Increased pain at the site of  the incision that is unrelieved with pain medicine. Difficulty breathing with or without chest pain New calf pain especially if only on one side Sudden, continuing increased vaginal bleeding with or without clots.   Contacts: For questions or concerns you should contact:  Dr. Jeral Pinch at [phone]  Joylene John, NP at [phone]  After Hours: call [phone] and have the GYN Oncologist paged/contacted (after 5 pm or on the weekends).  Messages sent via mychart are for non-urgent matters and are not responded to after hours so for urgent needs, please call the after hours number.

## 2021-11-29 NOTE — Progress Notes (Signed)
[name]   [phone] [phone]   Clinic Follow up Note   Patient Care Team: [name], MD as PCP - General (Family Medicine) Rolm Bookbinder, MD as Consulting Physician (General Surgery) Truitt Merle, MD as Consulting Physician (Hematology) Leighton Ruff, MD as Consulting Physician (General Surgery) Nicholas Lose, MD as Consulting Physician (Hematology and Oncology)  Date of Service:  11/29/2021  CHIEF COMPLAINT: f/u of colon cancer, h/o DCIS  CURRENT THERAPY:  Surveillance, pending GYN surgery  ASSESSMENT & PLAN:  Kim Castro is a 76 y.o. female with   1. New left ovarian mass, primary ovarian vs metastatic colon -CEA on 08/26/21 showed elevation to 6.99, and repeat on 10/25/21 rose further to 9.83. -restaging PET scan on 11/10/21 showed hypermetabolism to enlarged left ovary, no other evidence of metastatic disease. Further evaluation with pelvis MRI on 11/25/21 showed: 3.9 cm complex mass in left adnexa; endometrial thickening up to 15 mm. -we discussed that the ovary could be either metastatic from her colon cancer or a new primary ovarian cancer. If this is found to be a new ovarian cancer, I will refer her to my partner Dr. Alvy Bimler for further adjuvant care and recommendations. -she is clinically doing well but has recently noticed some new scant spotting. She is scheduled to meet Dr. Berline Lopes today for further work up. Plan for removal of at least the left ovary. I will see her back after surgery.  2. Right colon cancer, pT3N2aM0 stage IIB, MSS -She was diagnosed in 08/2019 with cecal mass biopsy showed moderately differentiated adenocarcinoma. CT chest from 10/16/19 did shows multiple small lung nodules that were nonspecific but likely benign. Will monitor.  -She underwent colon surgery with Dr Marcello Moores on 11/01/19. Path showed overall Stage IIIB cancer.  -She received 6 months of FOLFOX to reduce her risk of recurrence, completed  05/2020. She is currently on surveillance.  -surveillance colonoscopy on 02/10/21 under Dr. Carlean Purl showed only diverticulosis. Repeat due in 01/2024. -restaging CT CAP 08/26/21 was NED. CEA that day rose to 6.99, and repeat on 10/25/21 rose further to 9.83. -restaging PET scan on 11/10/21 showed hypermetabolism to enlarged left ovary, no other evidence of metastatic disease.  -we discussed that the ovary could be either metastatic from her colon cancer or a new primary ovarian cancer. If found to be metastatic colon, I will review treatment options with her.   3. Neuropathy, secondary to Oxaliplatin G2 -S/p C7 she started having numbness in hands, L>R which become prolonged with tingling s/p C10.  -Oxaliplatin dose reduced and ultimately held with C11-12.  -she is on Lyrica and B vit complex, mild and stable   4. H/o left breast DCIS, G2, ER/PR+ -Dx in 05/2014., s/p left lumpectomy with Dr Excell Seltzer, adjuvant RT with Dr Pablo Ledger, and Anastrozole 08/2014 - 01/13/20 under Dr Lindi Adie.  -DEXA 09/11/20 T score +1.2 normal.  -most recent mammogram on 10/04/21 was negative. -We again discussed a breast MRI for breast cancer screening; she is agreeable. We will plan for this in 04/2022.   5. Submassive bilateral pulmonary embolism, LE Edema  -S/p C3 chemo, her 01/27/20 CTA showed PE with right heart strain. She also has bilateral lower extremity edema and Doppler ultrasound was negative for DVT bilaterally.  -She was treated with 6 months of Eliquis, Lasix and oral potassium BID for LE Edema.   6. Comorbidities: Arthritis, DM, HTN, GERD  -she switched to Dr. Gena Fray for primary care in 05/2021.  PLAN:  -appt with Dr. Berline Lopes today -f/u after surgery per Dr. Berline Lopes   No problem-specific [place] notes found for this encounter.   SUMMARY OF ONCOLOGIC HISTORY: Oncology History Overview Note  Cancer Staging Malignant neoplasm of female breast [facility] Staging form: Breast, AJCC 7th Edition -  Clinical stage from 06/11/2014: Stage 0 (Tis (DCIS), N0, M0) - Unsigned Staged by: Pathologist and managing physician Laterality: Left Estrogen receptor status: Positive Progesterone receptor status: Positive Stage used in treatment planning: Yes National guidelines used in treatment planning: Yes Type of national guideline used in treatment planning: NCCN - Pathologic stage from 07/03/2014: Stage Unknown (Tis (DCIS), NX, cM0) - Signed by Enid Cutter, MD on 07/10/2014 Staged by: Pathologist Laterality: Left Estrogen receptor status: Positive Progesterone receptor status: Positive Stage used in treatment planning: Yes National guidelines used in treatment planning: Yes Type of national guideline used in treatment planning: NCCN Staging comments: Staged on final lumpectomy specimen by Dr. Donato Heinz.  right colon cancer Staging form: Colon and Rectum, AJCC 8th Edition - Pathologic stage from 11/01/2019: Stage IIIB (pT3, pN2a, cM0) - Signed by Truitt Merle, MD on 11/29/2019 Stage prefix: Initial diagnosis Histologic grading system: 4 grade system Histologic grade (G): G2 Residual tumor (R): R0 - None Tumor deposits (TD): Absent Perineural invasion (PNI): Absent Microsatellite instability (MSI): Stable KRAS mutation: Unknown NRAS mutation: Unknown BRAF Mutation: Unknown    Malignant neoplasm of female breast ([place])  05/29/2014 Initial Biopsy   Left breast needle core biopsy: Grade 2, DCIS with calcs. ER+ (100%), PR+ (96%).    06/04/2014 Initial Diagnosis   Left breast DCIS with calcifications, ER 100%, PR 96%   06/10/2014 Breast MRI   Left breast: 2.4 x 1.3 x 1.1 cm area of patchy non-mass enhancement upper outer quadrant includes postbiopsy seroma; Right breast: 1.2 cm previously biopsied stable benign fibroadenoma   06/12/2014 Procedure   Genetic counseling/testing: Identified 1 VUS on CHEK2 gene. Remainder of 17 gene panel tested negative and included: ATM, BARD1, BRCA 1/2, BRIP1, CDH1, CHEK2,  EPCAM, MLH1, MSH2, MSH6, NBN, NF1, PALB2, PTEN, RAD50, RAD51C, RAD51D, STK11, and TP53.    07/01/2014 Surgery   Left breast lumpectomy ([place]): Grade 1, DCIS, spanning 2.3 cm, 1 mm margin, ER 100%, PR 96%   07/31/2014 - 08/28/2014 Radiation Therapy   Adjuvant RT completed Pablo Ledger). Left breast: Total dose 42.5 Gy over 17 fractions. Left breast boost: Total dose 7.5 Gy over 3 fractions.    09/14/2014 - 01/13/2020 Anti-estrogen oral therapy   Anastrazole $RemoveBefo'1mg'UbzhlRxIYwj$  daily. Planned duration of treatment: 5 years [place]). Completed in 01/2020.    09/25/2014 Survivorship   Survivorship Care Plan given to patient and reviewed with her in person.    03/02/2021 Imaging   CT CAP  IMPRESSION: 1. No findings of active/recurrent malignancy. Partial right hemicolectomy. 2. Endometrial stripe remains mildly thickened, but endometrial biopsy in May was negative for malignancy. 3. Progressive endplate sclerosis and endplate irregularity at T2-3, probably due to degenerative endplate findings. If the has referable upper thoracic pain/symptoms then thoracic spine MRI could be used for further workup. 4. Other imaging findings of potential clinical significance: Mild cardiomegaly. Aortic Atherosclerosis (ICD10-I70.0). Mild mitral valve calcification. Postoperative findings in the left breast with adjacent radiation port anteriorly in the left upper lobe. Tiny pulmonary nodules in the left lower lobe are unchanged from earliest available comparison of 10/17/2019 and probably benign although may merit surveillance. Multilevel lumbar impingement. Mild pelvic floor laxity.   Right colon cancer  09/19/2019 Imaging  CT AP W contrast 09/19/19  IMPRESSION Fullness in the cecum, cannot exclude a mass. No evidence for metastatic disease is identified.    09/24/2019 Procedure   Colonoscopy by Dr Eber Jones 09/24/19 IMPRESSION 1. The colon was redundant  2. Mild diverticulosis was noted through the entire examined colon 3.  Single 2mm polyp was found in the ascending colon; polypectomy was performed using snare cautery and biopsy forceps 4. Mild diverticulosis was notes in the descending colon and sigmoid colon.  5. Single polyp was found in the sigmoid colon, polypectomy was performed with cold forceps.  6. Single polyp was found in the rectosigmoid colon; polypectomy was performed with cold snare  7. Small internal hemorrhoids  8. Large mass was found at the cecum; multiple biopsies of the area were performed using cold forceps; injection (tattooing) was performed distal to the mass.    09/24/2019 Initial Biopsy   INTERPRETATION AND DIAGNOSIS:  A. Cecum, biopsy:  Invasive moderately differentiated adenocarcinoma.  see comment  B. Polyp @ ascending colon, polypectomy:  Tubular Adenoma  C. Polyp @ sigmoid colon Polypectomy:  hyperplastic polyp.  D. Polyp @ rectosigmoid colon, Polypectomy:  Hyperplastic Polyp      10/16/2019 Imaging   CT Chest IMPRESSION: 1. Multiple small pulmonary nodules measuring 5 mm or less in size in the lungs. These are nonspecific and are typically considered statistically likely benign. However, given the patient's history of primary malignancy, close attention on follow-up studies is recommended to ensure stability. 2. Aortic atherosclerosis, in addition to right coronary artery disease. Assessment for potential risk factor modification, dietary therapy or pharmacologic therapy may be warranted, if clinically indicated. 3. There are calcifications of the aortic valve and mitral annulus. Echocardiographic correlation for evaluation of potential valvular dysfunction may be warranted if clinically indicated. 4. Small hiatal hernia.   Aortic Atherosclerosis (ICD10-I70.0).   11/01/2019 Initial Diagnosis   Colon cancer ([place])   11/01/2019 Surgery   LAPAROSCOPIC PARTIAL COLECTOMY by Dr Marcello Moores and Dr Johney Maine   11/01/2019 Pathology Results   FINAL MICROSCOPIC DIAGNOSIS:   A.  COLON, PROXIMAL RIGHT, COLECTOMY:  - Invasive colonic adenocarcinoma, 5 cm.  - Tumor invades through the muscularis propria into pericolonic tissues.   - Margins of resection are not involved.  - Metastatic carcinoma in (5) of (13) lymph nodes.  - See oncology table.    MSI Stable  Mismatch repair normal  MLH1 - Preserved nuclear expression (greater 50% tumor expression) MSH2 - Preserved nuclear expression (greater 50% tumor expression) MSH6 - Preserved nuclear expression (greater 50% tumor expression) PMS2 - Preserved nuclear expression (greater 50% tumor expression)   11/01/2019 Cancer Staging   Staging form: Colon and Rectum, AJCC 8th Edition - Pathologic stage from 11/01/2019: Stage IIIB (pT3, pN2a, cM0) - Signed by Truitt Merle, MD on 11/29/2019   12/10/2019 Procedure   PAC placed 12/10/19   12/17/2019 - 06/01/2020 Chemotherapy   FOLFOX q2weeks starting in 2 weeks starting 12/17/19. Held 01/27/20-02/10/20 due to b/l PE. Oxaliplatin held C11-12 due to neuropathy. Completed on 06/01/20   03/02/2021 Imaging   CT CAP  IMPRESSION: 1. No findings of active/recurrent malignancy. Partial right hemicolectomy. 2. Endometrial stripe remains mildly thickened, but endometrial biopsy in May was negative for malignancy. 3. Progressive endplate sclerosis and endplate irregularity at T2-3, probably due to degenerative endplate findings. If the has referable upper thoracic pain/symptoms then thoracic spine MRI could be used for further workup. 4. Other imaging findings of potential clinical significance: Mild cardiomegaly. Aortic Atherosclerosis (ICD10-I70.0). Mild  mitral valve calcification. Postoperative findings in the left breast with adjacent radiation port anteriorly in the left upper lobe. Tiny pulmonary nodules in the left lower lobe are unchanged from earliest available comparison of 10/17/2019 and probably benign although may merit surveillance. Multilevel lumbar impingement. Mild pelvic floor  laxity.   08/26/2021 Imaging   EXAM: CT CHEST, ABDOMEN, AND PELVIS WITH CONTRAST  IMPRESSION: 1. Stable examination without new or progressive findings to suggest local recurrence or metastatic disease within the chest, abdomen, or pelvis. 2. Hepatomegaly with hepatic steatosis. 3. Sigmoid colonic diverticulosis without findings of acute diverticulitis. 4. Similar prominent endplate sclerosis and irregularity at T2-T3 is most consistent with Modic type endplate degenerative changes. However, if patient has referable upper thoracic pain consider further workup with thoracic spine MRI. 5. Similar thickening of the endometrial stripe measuring up to 8 mm, which was previously biopsied with results negative for malignancy. 6. Aortic Atherosclerosis (ICD10-I70.0).   11/10/2021 PET scan   IMPRESSION: 1. LEFT ovary is increased in size and is intensely hypermetabolic. While physiologic hypermetabolic ovarian tissue is not uncommon, the enlargement and asymmetric activity warrants further evaluation. Consider contrast pelvic MRI vs tissue sampling. 2. No evidence of metastatic colorectal carcinoma otherwise. 3. Post RIGHT hemicolectomy anatomy. 4. Evidence of radiation change in the LEFT upper lobe (remote breast cancer).        INTERVAL HISTORY:  Kim Castro is here for a follow up of colon cancer. She was last seen by NP Lacie on 11/16/21. She presents to the clinic alone. She reports she began spotting in the last week. She notes it's only scant right now. She denies pain, aside from chronic back pain. She reports some residual neuropathy, mild and does not cause functional deficits.   All other systems were reviewed with the patient and are negative.  MEDICAL HISTORY:  Past Medical History:  Diagnosis Date   Aortic atherosclerosis (HCC)    Arthritis    feet, lower back   Basal cell carcinoma    arm   Breast cancer of upper-outer quadrant of left female breast ([place])  06/04/2014   Cataract    immature on the left   Colon cancer ([place]) 08/2019   Diabetes mellitus without complication (HCC)    Diverticulosis    Dizziness    > 109yrs ago;took Antivert    Family history of anesthesia complication    sister slow to wake up with anesthesia   Family history of breast cancer    Family history of colon cancer    Family history of uterine cancer    GERD (gastroesophageal reflux disease)    takes occasional TUMs   History of bronchitis    > 68yrs ago   History of colon polyps    History of hiatal hernia    Small noted on CT   History of pulmonary embolus (PE)    Hypertension    takes Losartan daily and HCTZ   Iron deficiency anemia    Joint pain    Numbness    to toes on each foot   Peripheral neuropathy    feet and toes   Personal history of radiation therapy    Pulmonary nodules    Noted on CT   Radiation 07/31/14-08/28/14   Left Breast 20 fxs   Seasonal allergies    takes Claritin prn   Urinary frequency    Vitamin D deficiency    takes VIt D daily    SURGICAL HISTORY: Past Surgical History:  Procedure  Laterality Date   BREAST BIOPSY Bilateral    BREAST LUMPECTOMY Left    BREAST LUMPECTOMY WITH RADIOACTIVE SEED LOCALIZATION Left 07/01/2014   Procedure: LEFT BREAST LUMPECTOMY WITH RADIOACTIVE SEED LOCALIZATION;  Surgeon: Excell Seltzer, MD;  Location: [facility];  Service: General;  Laterality: Left;   CATARACT EXTRACTION Right    COLONOSCOPY  09/24/2019   [place]   LAPAROSCOPIC PARTIAL COLECTOMY N/A 11/01/2019   Procedure: LAPAROSCOPIC PARTIAL COLECTOMY;  Surgeon: Leighton Ruff, MD;  Location: [facility];  Service: General;  Laterality: N/A;   POLYPECTOMY     PORTACATH PLACEMENT N/A 12/10/2019   Procedure: INSERTION PORT-A-CATH ULTRASOUND GUIDED IN RIGHT IJ;  Surgeon: Leighton Ruff, MD;  Location: [facility];  Service: General;  Laterality: N/A;   TOTAL KNEE ARTHROPLASTY Left 10/24/2012   Procedure: TOTAL KNEE ARTHROPLASTY;   Surgeon: Kerin Salen, MD;  Location: [facility];  Service: Orthopedics;  Laterality: Left;   TOTAL KNEE ARTHROPLASTY Right 01/09/2013   Procedure: TOTAL KNEE ARTHROPLASTY;  Surgeon: Kerin Salen, MD;  Location: [facility];  Service: Orthopedics;  Laterality: Right;   TUBAL LIGATION      I have reviewed the social history and family history with the patient and they are unchanged from previous note.  ALLERGIES:  is allergic to oxycodone.  MEDICATIONS:  Current Outpatient Medications  Medication Sig Dispense Refill   acetaminophen (TYLENOL) 325 MG tablet Take 650 mg by mouth every 6 (six) hours as needed.     aspirin EC 81 MG tablet Take 81 mg by mouth daily. Swallow whole.     b complex vitamins capsule Take 1 capsule by mouth daily. Patient unsure of dose     Cholecalciferol (VITAMIN D) 2000 UNITS CAPS Take 2,000 Units by mouth daily.      lidocaine-prilocaine (EMLA) cream Apply to affected area once 30 g 3   loratadine (CLARITIN) 10 MG tablet Take 10 mg by mouth daily as needed for allergies.     losartan (COZAAR) 25 MG tablet Take 25 mg by mouth daily.     metFORMIN (GLUCOPHAGE) 500 MG tablet Take 1 tablet (500 mg total) by mouth in the morning and at bedtime.     Multiple Vitamins-Minerals (MULTIVITAMIN WITH MINERALS) tablet Take 1 tablet by mouth daily.     omeprazole (PRILOSEC) 20 MG capsule Take 20 mg by mouth daily before breakfast.      pravastatin (PRAVACHOL) 10 MG tablet Take 10 mg by mouth daily.     pregabalin (LYRICA) 25 MG capsule Take 1 capsule (25 mg total) by mouth daily. 30 capsule 2   No current facility-administered medications for this visit.   Facility-Administered Medications Ordered in Other Visits  Medication Dose Route Frequency Provider Last Rate Last Admin   sodium chloride flush (NS) 0.9 % injection 10 mL  10 mL Intracatheter PRN Truitt Merle, MD   10 mL at 11/29/21 1323    PHYSICAL EXAMINATION: ECOG PERFORMANCE STATUS: {CHL ONC ECOG [phone]}  There were  no vitals filed for this visit. Wt Readings from Last 3 Encounters:  11/09/21 252 lb 6.4 oz (114.5 kg)  08/30/21 251 lb 9.6 oz (114.1 kg)  08/10/21 253 lb 3.2 oz (114.9 kg)     GENERAL:alert, no distress and comfortable SKIN: skin color normal, no rashes or significant lesions EYES: normal, Conjunctiva are pink and non-injected, sclera clear  NEURO: alert & oriented x 3 with fluent speech  LABORATORY DATA:  I have reviewed the data as listed  Latest Ref Rng & Units 11/29/2021    1:23 PM 10/11/2021    7:41 AM 08/26/2021    7:48 AM  CBC  WBC 4.0 - 10.5 K/uL 8.2  6.9  6.8   Hemoglobin 12.0 - 15.0 g/dL 12.2  12.1  11.9   Hematocrit 36.0 - 46.0 % 36.6  36.9  36.7   Platelets 150 - 400 K/uL 256  247  247         Latest Ref Rng & Units 11/29/2021    1:23 PM 11/09/2021   10:28 AM 10/11/2021    7:41 AM  CMP  Glucose 70 - 99 mg/dL 118  126  140   BUN 8 - 23 mg/dL 17   18   Creatinine 0.44 - 1.00 mg/dL 0.73   0.81   Sodium 135 - 145 mmol/L 142   142   Potassium 3.5 - 5.1 mmol/L 3.9   3.9   Chloride 98 - 111 mmol/L 109   108   CO2 22 - 32 mmol/L 27   24   Calcium 8.9 - 10.3 mg/dL 8.7   9.3   Total Protein 6.5 - 8.1 g/dL 7.0   6.8   Total Bilirubin 0.3 - 1.2 mg/dL 1.0   1.0   Alkaline Phos 38 - 126 U/L 64   64   AST 15 - 41 U/L 19   22   ALT 0 - 44 U/L 14   19       RADIOGRAPHIC STUDIES: I have personally reviewed the radiological images as listed and agreed with the findings in the report. No results found.    No orders of the defined types were placed in this encounter.  All questions were answered. The patient knows to call the clinic with any problems, questions or concerns. No barriers to learning was detected. The total time spent in the appointment was {CHL ONC TIME VISIT - BSWHQ:[id number]}.     Aurea Graff 11/29/2021   I, Wilburn Mylar, am acting as scribe for Truitt Merle, MD.   {Add scribe attestation statement}

## 2021-11-30 ENCOUNTER — Encounter: Payer: Self-pay | Admitting: Hematology

## 2021-11-30 LAB — SURGICAL PATHOLOGY

## 2021-11-30 LAB — CA 125: Cancer Antigen (CA) 125: 19.5 U/mL (ref 0.0–38.1)

## 2021-11-30 MED ORDER — LOSARTAN POTASSIUM 25 MG PO TABS: 25.0000 mg | ORAL_TABLET | Freq: Every day | ORAL | 3 refills | Status: DC

## 2021-11-30 MED ORDER — METFORMIN HCL 500 MG PO TABS: 500.0000 mg | ORAL_TABLET | Freq: Two times a day (BID) | ORAL | 3 refills | Status: DC

## 2021-11-30 MED ORDER — PRAVASTATIN SODIUM 10 MG PO TABS
10.0000 mg | ORAL_TABLET | Freq: Every day | ORAL | 3 refills | Status: DC
Start: 2021-11-30 — End: 2022-11-15

## 2021-11-30 NOTE — Telephone Encounter (Signed)
Fill request for the following meds.   Pravastatin, Losartan, and Metformin LR  previous provider LVO 11/09/21 FOV 02/09/22  Please review and advise   Thanks. Dm/cma

## 2021-11-30 NOTE — Telephone Encounter (Signed)
Patient notified VIA phone.  No questions.  Dm/cma ? ?

## 2021-12-02 ENCOUNTER — Telehealth: Payer: Self-pay | Admitting: Hematology

## 2021-12-02 NOTE — Telephone Encounter (Signed)
Scheduled follow-up appointment per 7/31 los. Patient is aware.

## 2021-12-06 NOTE — Patient Instructions (Signed)
SURGICAL WAITING ROOM VISITATION Patients having surgery or a procedure may have no more than 2 support people in the waiting area - these visitors may rotate.   Children under the age of 55 must have an adult with them who is not the patient. If the patient needs to stay at the hospital during part of their recovery, the visitor guidelines for inpatient rooms apply. Pre-op nurse will coordinate an appropriate time for 1 support person to accompany patient in pre-op.  This support person may not rotate.    Please refer to the [facility] website for the visitor guidelines for Inpatients (after your surgery is over and you are in a regular room).      Your procedure is scheduled on: 12-22-21   Report to [facility] Main Entrance    Report to admitting at 11:15 AM   Call this number if you have problems the morning of surgery [phone]   Follow a light diet day before surgery (avoid gas producing foods)   Do not eat food :After Midnight.   After Midnight you may have the following liquids until 10:30 AM DAY OF SURGERY  Water Non-Citrus Juices (without pulp, NO RED) Carbonated Beverages Black Coffee (NO MILK/CREAM OR CREAMERS, sugar ok)  Clear Tea (NO MILK/CREAM OR CREAMERS, sugar ok) regular and decaf                             Plain Jell-O (NO RED)                                           Fruit ices (not with fruit pulp, NO RED)                                     Popsicles (NO RED)                                                               Sports drinks like Gatorade (NO RED)                If you have questions, please contact your surgeon's office.   FOLLOW ANY ADDITIONAL PRE OP INSTRUCTIONS YOU RECEIVED FROM YOUR SURGEON'S OFFICE!!!     Oral Hygiene is also important to reduce your risk of infection.                                    Remember - BRUSH YOUR TEETH THE MORNING OF SURGERY WITH YOUR REGULAR TOOTHPASTE   Do NOT smoke after Midnight  Take these  medicines the morning of surgery with A SIP OF WATER:  Claritin, Omeprazole, Pravastatin, Pregabalin.  Okay to take Tylenol if needed  How to Manage Your Diabetes Before and After Surgery  Why is it important to control my blood sugar before and after surgery? Improving blood sugar levels before and after surgery helps healing and can limit problems. A way of improving blood sugar control is eating a healthy diet  by:  Eating less sugar and carbohydrates  Increasing activity/exercise  Talking with your doctor about reaching your blood sugar goals High blood sugars (greater than 180 mg/dL) can raise your risk of infections and slow your recovery, so you will need to focus on controlling your diabetes during the weeks before surgery. Make sure that the doctor who takes care of your diabetes knows about your planned surgery including the date and location.  How do I manage my blood sugar before surgery? Check your blood sugar at least 4 times a day, starting 2 days before surgery, to make sure that the level is not too high or low. Check your blood sugar the morning of your surgery when you wake up and every 2 hours until you get to the Short Stay unit. If your blood sugar is less than 70 mg/dL, you will need to treat for low blood sugar: Do not take insulin. Treat a low blood sugar (less than 70 mg/dL) with  cup of clear juice (cranberry or apple), 4 glucose tablets, OR glucose gel. Recheck blood sugar in 15 minutes after treatment (to make sure it is greater than 70 mg/dL). If your blood sugar is not greater than 70 mg/dL on recheck, call [phone] for further instructions. Report your blood sugar to the short stay nurse when you get to Short Stay.  If you are admitted to the hospital after surgery: Your blood sugar will be checked by the staff and you will probably be given insulin after surgery (instead of oral diabetes medicines) to make sure you have good blood sugar levels. The goal  for blood sugar control after surgery is 80-180 mg/dL.   WHAT DO I DO ABOUT MY DIABETES MEDICATION?  Do not take oral diabetes medicines (pills) the morning of surgery.  THE NIGHT BEFORE SURGERY:  Take Metformin as prescribed.  THE MORNING OF SURGERY:  Do not take Metformin.  Reviewed and Endorsed by [facility] Patient Education Committee, August 2015   Bring CPAP mask and tubing day of surgery.                              You may not have any metal on your body including hair pins, jewelry, and body piercing             Do not wear make-up, lotions, powders, perfumes or deodorant  Do not wear nail polish including gel and S&S, artificial/acrylic nails, or any other type of covering on natural nails including finger and toenails. If you have artificial nails, gel coating, etc. that needs to be removed by a nail salon please have this removed prior to surgery or surgery may need to be canceled/ delayed if the surgeon/ anesthesia feels like they are unable to be safely monitored.   Do not shave  48 hours prior to surgery.            Do not bring valuables to the hospital. [place].   Contacts, dentures or bridgework may not be worn into surgery.  DO NOT [place]. PHARMACY WILL DISPENSE MEDICATIONS LISTED ON YOUR MEDICATION LIST TO YOU DURING YOUR ADMISSION [place]!   Patients discharged on the day of surgery will not be allowed to drive home.  Someone NEEDS to stay with you for the first 24 hours after anesthesia.  Special Instructions: Bring a copy of your  healthcare power of attorney and living will documents the day of surgery if you haven't scanned them before.  Please read over the following fact sheets you were given: IF YOU HAVE QUESTIONS ABOUT YOUR PRE-OP INSTRUCTIONS PLEASE CALL [facility] - Preparing for Surgery Before surgery, you can play an important role.  Because  skin is not sterile, your skin needs to be as free of germs as possible.  You can reduce the number of germs on your skin by washing with CHG (chlorahexidine gluconate) soap before surgery.  CHG is an antiseptic cleaner which kills germs and bonds with the skin to continue killing germs even after washing. Please DO NOT use if you have an allergy to CHG or antibacterial soaps.  If your skin becomes reddened/irritated stop using the CHG and inform your nurse when you arrive at Short Stay. Do not shave (including legs and underarms) for at least 48 hours prior to the first CHG shower.  You may shave your face/neck.  Please follow these instructions carefully:  1.  Shower with CHG Soap the night before surgery and the  morning of surgery.  2.  If you choose to wash your hair, wash your hair first as usual with your normal  shampoo.  3.  After you shampoo, rinse your hair and body thoroughly to remove the shampoo.                             4.  Use CHG as you would any other liquid soap.  You can apply chg directly to the skin and wash.  Gently with a scrungie or clean washcloth.  5.  Apply the CHG Soap to your body ONLY FROM THE NECK DOWN.   Do   not use on face/ open                           Wound or open sores. Avoid contact with eyes, ears mouth and   genitals (private parts).                       Wash face,  Genitals (private parts) with your normal soap.             6.  Wash thoroughly, paying special attention to the area where your    surgery  will be performed.  7.  Thoroughly rinse your body with warm water from the neck down.  8.  DO NOT shower/wash with your normal soap after using and rinsing off the CHG Soap.                9.  Pat yourself dry with a clean towel.            10.  Wear clean pajamas.            11.  Place clean sheets on your bed the night of your first shower and do not  sleep with pets. Day of Surgery : Do not apply any lotions/deodorants the morning of surgery.   Please wear clean clothes to the hospital/surgery center.  FAILURE TO FOLLOW THESE INSTRUCTIONS MAY RESULT IN THE CANCELLATION OF YOUR SURGERY  PATIENT SIGNATURE_________________________________  NURSE SIGNATURE__________________________________  ________________________________________________________________________    WHAT IS A BLOOD TRANSFUSION? Blood Transfusion Information  A transfusion is the replacement of blood or some of its parts. Blood is made up of  multiple cells which provide different functions. Red blood cells carry oxygen and are used for blood loss replacement. White blood cells fight against infection. Platelets control bleeding. Plasma helps clot blood. Other blood products are available for specialized needs, such as hemophilia or other clotting disorders. BEFORE THE TRANSFUSION  Who gives blood for transfusions?  Healthy volunteers who are fully evaluated to make sure their blood is safe. This is blood bank blood. Transfusion therapy is the safest it has ever been in the practice of medicine. Before blood is taken from a donor, a complete history is taken to make sure that person has no history of diseases nor engages in risky social behavior (examples are intravenous drug use or sexual activity with multiple partners). The donor's travel history is screened to minimize risk of transmitting infections, such as malaria. The donated blood is tested for signs of infectious diseases, such as HIV and hepatitis. The blood is then tested to be sure it is compatible with you in order to minimize the chance of a transfusion reaction. If you or a relative donates blood, this is often done in anticipation of surgery and is not appropriate for emergency situations. It takes many days to process the donated blood. RISKS AND COMPLICATIONS Although transfusion therapy is very safe and saves many lives, the main dangers of transfusion include:  Getting an infectious  disease. Developing a transfusion reaction. This is an allergic reaction to something in the blood you were given. Every precaution is taken to prevent this. The decision to have a blood transfusion has been considered carefully by your caregiver before blood is given. Blood is not given unless the benefits outweigh the risks. AFTER THE TRANSFUSION Right after receiving a blood transfusion, you will usually feel much better and more energetic. This is especially true if your red blood cells have gotten low (anemic). The transfusion raises the level of the red blood cells which carry oxygen, and this usually causes an energy increase. The nurse administering the transfusion will monitor you carefully for complications. HOME CARE INSTRUCTIONS  No special instructions are needed after a transfusion. You may find your energy is better. Speak with your caregiver about any limitations on activity for underlying diseases you may have. SEEK MEDICAL CARE IF:  Your condition is not improving after your transfusion. You develop redness or irritation at the intravenous (IV) site. SEEK IMMEDIATE MEDICAL CARE IF:  Any of the following symptoms occur over the next 12 hours: Shaking chills. You have a temperature by mouth above 102 F (38.9 C), not controlled by medicine. Chest, back, or muscle pain. People around you feel you are not acting correctly or are confused. Shortness of breath or difficulty breathing. Dizziness and fainting. You get a rash or develop hives. You have a decrease in urine output. Your urine turns a dark color or changes to pink, red, or brown. Any of the following symptoms occur over the next 10 days: You have a temperature by mouth above 102 F (38.9 C), not controlled by medicine. Shortness of breath. Weakness after normal activity. The white part of the eye turns yellow (jaundice). You have a decrease in the amount of urine or are urinating less often. Your urine turns a  dark color or changes to pink, red, or brown. Document Released: 04/15/2000 Document Revised: 07/11/2011 Document Reviewed: 12/03/2007 [facility] Patient Information 2014 [place], [place].  _______________________________________________________________________

## 2021-12-07 NOTE — Progress Notes (Signed)
COVID Vaccine Completed:  Yes  Date of COVID positive in last 90 days:  PCP - Arlester Marker, MD Cardiologist -   Chest x-ray -  CT chest 08-26-21 Epic EKG -  Stress Test -  ECHO - 01-27-20 Epic Cardiac Cath -  Pacemaker/ICD device last checked: Spinal Cord Stimulator:  Bowel Prep -   Sleep Study -  CPAP -   Fasting Blood Sugar -  Checks Blood Sugar _____ times a day  Blood Thinner Instructions: Aspirin Instructions: Last Dose:  Activity level:  Can go up a flight of stairs and perform activities of daily living without stopping and without symptoms of chest pain or shortness of breath.  Able to exercise without symptoms  Unable to go up a flight of stairs without symptoms of     Anesthesia review:   Patient denies shortness of breath, fever, cough and chest pain at PAT appointment  Patient verbalized understanding of instructions that were given to them at the PAT appointment. Patient was also instructed that they will need to review over the PAT instructions again at home before surgery.

## 2021-12-09 ENCOUNTER — Ambulatory Visit: Payer: Medicare Other | Admitting: Gynecologic Oncology

## 2021-12-09 ENCOUNTER — Other Ambulatory Visit: Payer: Self-pay

## 2021-12-09 ENCOUNTER — Encounter (HOSPITAL_COMMUNITY)
Admission: RE | Admit: 2021-12-09 | Discharge: 2021-12-09 | Disposition: A | Discharge status: Home / Self Care | Payer: Medicare Other | Source: Ambulatory Visit | Attending: Gynecologic Oncology | Admitting: Gynecologic Oncology

## 2021-12-09 ENCOUNTER — Encounter (HOSPITAL_COMMUNITY): Payer: Self-pay

## 2021-12-09 VITALS — BP 139/78 | HR 69 | Temp 98.3°F | Resp 16 | Ht 65.0 in | Wt 247.4 lb

## 2021-12-09 DIAGNOSIS — N9489 Other specified conditions associated with female genital organs and menstrual cycle: Secondary | ICD-10-CM | POA: Diagnosis not present

## 2021-12-09 DIAGNOSIS — I2510 Atherosclerotic heart disease of native coronary artery without angina pectoris: Secondary | ICD-10-CM | POA: Insufficient documentation

## 2021-12-09 DIAGNOSIS — R9389 Abnormal findings on diagnostic imaging of other specified body structures: Secondary | ICD-10-CM | POA: Insufficient documentation

## 2021-12-09 DIAGNOSIS — Z01818 Encounter for other preprocedural examination: Secondary | ICD-10-CM | POA: Diagnosis not present

## 2021-12-09 DIAGNOSIS — E119 Type 2 diabetes mellitus without complications: Secondary | ICD-10-CM | POA: Diagnosis not present

## 2021-12-09 LAB — TYPE AND SCREEN
ABO/RH(D): O POS
Antibody Screen: NEGATIVE

## 2021-12-09 LAB — GLUCOSE, CAPILLARY: Glucose-Capillary: 126 mg/dL — ABNORMAL HIGH (ref 70–99)

## 2021-12-13 ENCOUNTER — Other Ambulatory Visit: Payer: Self-pay | Admitting: Hematology

## 2021-12-13 ENCOUNTER — Telehealth: Payer: Self-pay | Admitting: Gynecologic Oncology

## 2021-12-13 DIAGNOSIS — R202 Paresthesia of skin: Secondary | ICD-10-CM

## 2021-12-13 NOTE — Telephone Encounter (Signed)
Called the patient again to discuss recent endometrial biopsy.  Benign polypoid tissue noted.  In terms of options during surgery next week, I recommend consideration of 1 of 2 management strategies with regard to her thickened endometrium.  Given recurrent episodes of postmenopausal bleeding and polypoid tissue, we could either proceed with hysteroscopy and dilation and curettage versus definitive hysterectomy.  Patient's preference to avoid additional biopsies or procedures in the future is to proceed with total hysterectomy.  Jeral Pinch MD Gynecologic Oncology

## 2021-12-21 ENCOUNTER — Telehealth: Payer: Self-pay

## 2021-12-21 NOTE — Telephone Encounter (Signed)
Telephone call to check on pre-operative status.  Patient compliant with pre-operative instructions.  Reinforced nothing to eat after midnight. Clear liquids until 10:30am. Patient to arrive at 11:15am.  No questions or concerns voiced.  Instructed to call for any needs.

## 2021-12-22 ENCOUNTER — Ambulatory Visit (HOSPITAL_COMMUNITY)
Admission: RE | Admit: 2021-12-22 | Discharge: 2021-12-23 | Disposition: A | Discharge status: Home / Self Care | Payer: Medicare Other | Source: Ambulatory Visit | Attending: Gynecologic Oncology | Admitting: Gynecologic Oncology

## 2021-12-22 ENCOUNTER — Encounter (HOSPITAL_COMMUNITY)
Admission: RE | Disposition: A | Discharge status: Home / Self Care | Payer: Self-pay | Source: Ambulatory Visit | Attending: Gynecologic Oncology

## 2021-12-22 ENCOUNTER — Encounter (HOSPITAL_COMMUNITY): Payer: Self-pay | Admitting: Gynecologic Oncology

## 2021-12-22 ENCOUNTER — Other Ambulatory Visit: Payer: Self-pay

## 2021-12-22 ENCOUNTER — Ambulatory Visit (HOSPITAL_BASED_OUTPATIENT_CLINIC_OR_DEPARTMENT_OTHER): Payer: Medicare Other | Admitting: Certified Registered Nurse Anesthetist

## 2021-12-22 ENCOUNTER — Ambulatory Visit (HOSPITAL_COMMUNITY): Payer: Medicare Other | Admitting: Certified Registered Nurse Anesthetist

## 2021-12-22 DIAGNOSIS — R110 Nausea: Secondary | ICD-10-CM

## 2021-12-22 DIAGNOSIS — K219 Gastro-esophageal reflux disease without esophagitis: Secondary | ICD-10-CM | POA: Insufficient documentation

## 2021-12-22 DIAGNOSIS — Z853 Personal history of malignant neoplasm of breast: Secondary | ICD-10-CM | POA: Diagnosis not present

## 2021-12-22 DIAGNOSIS — I10 Essential (primary) hypertension: Secondary | ICD-10-CM | POA: Diagnosis not present

## 2021-12-22 DIAGNOSIS — N838 Other noninflammatory disorders of ovary, fallopian tube and broad ligament: Secondary | ICD-10-CM

## 2021-12-22 DIAGNOSIS — N8003 Adenomyosis of the uterus: Secondary | ICD-10-CM | POA: Diagnosis not present

## 2021-12-22 DIAGNOSIS — Z85038 Personal history of other malignant neoplasm of large intestine: Secondary | ICD-10-CM | POA: Insufficient documentation

## 2021-12-22 DIAGNOSIS — C786 Secondary malignant neoplasm of retroperitoneum and peritoneum: Secondary | ICD-10-CM | POA: Insufficient documentation

## 2021-12-22 DIAGNOSIS — C7963 Secondary malignant neoplasm of bilateral ovaries: Secondary | ICD-10-CM | POA: Diagnosis not present

## 2021-12-22 DIAGNOSIS — Z9889 Other specified postprocedural states: Secondary | ICD-10-CM

## 2021-12-22 DIAGNOSIS — E119 Type 2 diabetes mellitus without complications: Secondary | ICD-10-CM | POA: Diagnosis not present

## 2021-12-22 DIAGNOSIS — N840 Polyp of corpus uteri: Secondary | ICD-10-CM | POA: Insufficient documentation

## 2021-12-22 DIAGNOSIS — G620 Drug-induced polyneuropathy: Secondary | ICD-10-CM

## 2021-12-22 DIAGNOSIS — C55 Malignant neoplasm of uterus, part unspecified: Secondary | ICD-10-CM | POA: Diagnosis not present

## 2021-12-22 DIAGNOSIS — C763 Malignant neoplasm of pelvis: Secondary | ICD-10-CM | POA: Diagnosis not present

## 2021-12-22 DIAGNOSIS — Z6841 Body Mass Index (BMI) 40.0 and over, adult: Secondary | ICD-10-CM | POA: Insufficient documentation

## 2021-12-22 DIAGNOSIS — R519 Headache, unspecified: Secondary | ICD-10-CM

## 2021-12-22 DIAGNOSIS — Z7984 Long term (current) use of oral hypoglycemic drugs: Secondary | ICD-10-CM

## 2021-12-22 DIAGNOSIS — N9489 Other specified conditions associated with female genital organs and menstrual cycle: Secondary | ICD-10-CM

## 2021-12-22 DIAGNOSIS — C801 Malignant (primary) neoplasm, unspecified: Secondary | ICD-10-CM | POA: Diagnosis not present

## 2021-12-22 DIAGNOSIS — E1169 Type 2 diabetes mellitus with other specified complication: Secondary | ICD-10-CM

## 2021-12-22 DIAGNOSIS — C19 Malignant neoplasm of rectosigmoid junction: Secondary | ICD-10-CM

## 2021-12-22 DIAGNOSIS — R9389 Abnormal findings on diagnostic imaging of other specified body structures: Secondary | ICD-10-CM | POA: Insufficient documentation

## 2021-12-22 DIAGNOSIS — C189 Malignant neoplasm of colon, unspecified: Secondary | ICD-10-CM | POA: Diagnosis present

## 2021-12-22 DIAGNOSIS — C182 Malignant neoplasm of ascending colon: Secondary | ICD-10-CM | POA: Diagnosis present

## 2021-12-22 HISTORY — PX: ROBOTIC ASSISTED TOTAL HYSTERECTOMY: SHX6085

## 2021-12-22 LAB — CBC
HCT: 39.6 % (ref 36.0–46.0)
Hemoglobin: 12.7 g/dL (ref 12.0–15.0)
MCH: 32.9 pg (ref 26.0–34.0)
MCHC: 32.1 g/dL (ref 30.0–36.0)
MCV: 102.6 fL — ABNORMAL HIGH (ref 80.0–100.0)
Platelets: 243 10*3/uL (ref 150–400)
RBC: 3.86 MIL/uL — ABNORMAL LOW (ref 3.87–5.11)
RDW: 13.2 % (ref 11.5–15.5)
WBC: 6.9 10*3/uL (ref 4.0–10.5)
nRBC: 0 % (ref 0.0–0.2)

## 2021-12-22 LAB — COMPREHENSIVE METABOLIC PANEL
ALT: 18 U/L (ref 0–44)
AST: 26 U/L (ref 15–41)
Albumin: 4.1 g/dL (ref 3.5–5.0)
Alkaline Phosphatase: 63 U/L (ref 38–126)
Anion gap: 8 (ref 5–15)
BUN: 14 mg/dL (ref 8–23)
CO2: 24 mmol/L (ref 22–32)
Calcium: 9.1 mg/dL (ref 8.9–10.3)
Chloride: 109 mmol/L (ref 98–111)
Creatinine, Ser: 0.74 mg/dL (ref 0.44–1.00)
GFR, Estimated: >60 mL/min (ref >60)
Glucose, Bld: 118 mg/dL — ABNORMAL HIGH (ref 70–99)
Potassium: 4.3 mmol/L (ref 3.5–5.1)
Sodium: 141 mmol/L (ref 135–145)
Total Bilirubin: 1.5 mg/dL — ABNORMAL HIGH (ref 0.3–1.2)
Total Protein: 6.9 g/dL (ref 6.5–8.1)

## 2021-12-22 LAB — GLUCOSE, CAPILLARY
Glucose-Capillary: 114 mg/dL — ABNORMAL HIGH (ref 70–99)
Glucose-Capillary: 158 mg/dL — ABNORMAL HIGH (ref 70–99)

## 2021-12-22 SURGERY — HYSTERECTOMY, TOTAL, ROBOT-ASSISTED
Anesthesia: General | Laterality: Bilateral

## 2021-12-22 MED ORDER — HYDRALAZINE HCL 20 MG/ML IJ SOLN
INTRAMUSCULAR | Status: DC | PRN
  Administered 2021-12-22: 4 mg via INTRAVENOUS

## 2021-12-22 MED ORDER — LACTATED RINGERS IR SOLN
Status: DC | PRN
  Administered 2021-12-22: 1000 mL

## 2021-12-22 MED ORDER — ACETAMINOPHEN 500 MG PO TABS
1000.0000 mg | ORAL_TABLET | ORAL | Status: DC
  Filled 2021-12-22: qty 2

## 2021-12-22 MED ORDER — DEXAMETHASONE SODIUM PHOSPHATE 4 MG/ML IJ SOLN: 4.0000 mg | INTRAMUSCULAR | Status: DC

## 2021-12-22 MED ORDER — ROCURONIUM BROMIDE 10 MG/ML (PF) SYRINGE
PREFILLED_SYRINGE | INTRAVENOUS | Status: AC
  Filled 2021-12-22: qty 10

## 2021-12-22 MED ORDER — STERILE WATER FOR IRRIGATION IR SOLN
Status: DC | PRN
  Administered 2021-12-22: 1000 mL

## 2021-12-22 MED ORDER — KETOROLAC TROMETHAMINE 15 MG/ML IJ SOLN
15.0000 mg | Freq: Once | INTRAMUSCULAR | Status: AC | PRN
  Administered 2021-12-22: 15 mg via INTRAVENOUS

## 2021-12-22 MED ORDER — DEXAMETHASONE SODIUM PHOSPHATE 10 MG/ML IJ SOLN
INTRAMUSCULAR | Status: DC | PRN
  Administered 2021-12-22: 4 mg via INTRAVENOUS

## 2021-12-22 MED ORDER — PROPOFOL 500 MG/50ML IV EMUL
INTRAVENOUS | Status: AC
  Filled 2021-12-22: qty 50

## 2021-12-22 MED ORDER — FENTANYL CITRATE (PF) 100 MCG/2ML IJ SOLN
INTRAMUSCULAR | Status: AC
  Filled 2021-12-22: qty 2

## 2021-12-22 MED ORDER — ONDANSETRON HCL 4 MG/2ML IJ SOLN: 4.0000 mg | Freq: Four times a day (QID) | INTRAMUSCULAR | Status: DC | PRN

## 2021-12-22 MED ORDER — HYDROCODONE-ACETAMINOPHEN 5-325 MG PO TABS: 1.0000 | ORAL_TABLET | ORAL | Status: DC | PRN

## 2021-12-22 MED ORDER — KCL IN DEXTROSE-NACL 10-5-0.45 MEQ/L-%-% IV SOLN
INTRAVENOUS | Status: DC
  Filled 2021-12-22 (×2): qty 1000

## 2021-12-22 MED ORDER — ROCURONIUM BROMIDE 10 MG/ML (PF) SYRINGE
PREFILLED_SYRINGE | INTRAVENOUS | Status: DC | PRN
  Administered 2021-12-22: 60 mg via INTRAVENOUS

## 2021-12-22 MED ORDER — MIDAZOLAM HCL 5 MG/5ML IJ SOLN
INTRAMUSCULAR | Status: DC | PRN
  Administered 2021-12-22: 2 mg via INTRAVENOUS

## 2021-12-22 MED ORDER — ONDANSETRON HCL 4 MG/2ML IJ SOLN
INTRAMUSCULAR | Status: DC | PRN
  Administered 2021-12-22: 4 mg via INTRAVENOUS

## 2021-12-22 MED ORDER — PROPOFOL 10 MG/ML IV BOLUS
INTRAVENOUS | Status: DC | PRN
  Administered 2021-12-22: 200 mg via INTRAVENOUS

## 2021-12-22 MED ORDER — ONDANSETRON HCL 4 MG PO TABS: 4.0000 mg | ORAL_TABLET | Freq: Four times a day (QID) | ORAL | Status: DC | PRN

## 2021-12-22 MED ORDER — SUGAMMADEX SODIUM 200 MG/2ML IV SOLN
INTRAVENOUS | Status: DC | PRN
  Administered 2021-12-22: 250 mg via INTRAVENOUS

## 2021-12-22 MED ORDER — KETOROLAC TROMETHAMINE 15 MG/ML IJ SOLN
INTRAMUSCULAR | Status: AC
  Filled 2021-12-22: qty 1

## 2021-12-22 MED ORDER — FENTANYL CITRATE (PF) 100 MCG/2ML IJ SOLN
INTRAMUSCULAR | Status: DC | PRN
  Administered 2021-12-22 (×4): 50 ug via INTRAVENOUS

## 2021-12-22 MED ORDER — FENTANYL CITRATE PF 50 MCG/ML IJ SOSY
25.0000 ug | PREFILLED_SYRINGE | INTRAMUSCULAR | Status: DC | PRN
  Administered 2021-12-22 (×2): 50 ug via INTRAVENOUS

## 2021-12-22 MED ORDER — ACETAMINOPHEN 10 MG/ML IV SOLN
INTRAVENOUS | Status: AC
  Filled 2021-12-22: qty 100

## 2021-12-22 MED ORDER — ONDANSETRON HCL 4 MG/2ML IJ SOLN
INTRAMUSCULAR | Status: AC
  Filled 2021-12-22: qty 2

## 2021-12-22 MED ORDER — ACETAMINOPHEN 10 MG/ML IV SOLN
1000.0000 mg | Freq: Once | INTRAVENOUS | Status: DC | PRN
  Administered 2021-12-22: 1000 mg via INTRAVENOUS

## 2021-12-22 MED ORDER — SUGAMMADEX SODIUM 500 MG/5ML IV SOLN
INTRAVENOUS | Status: AC
  Filled 2021-12-22: qty 5

## 2021-12-22 MED ORDER — MIDAZOLAM HCL 2 MG/2ML IJ SOLN
INTRAMUSCULAR | Status: AC
  Filled 2021-12-22: qty 2

## 2021-12-22 MED ORDER — EPHEDRINE 5 MG/ML INJ
INTRAVENOUS | Status: AC
  Filled 2021-12-22: qty 10

## 2021-12-22 MED ORDER — DEXAMETHASONE SODIUM PHOSPHATE 10 MG/ML IJ SOLN
INTRAMUSCULAR | Status: AC
  Filled 2021-12-22: qty 1

## 2021-12-22 MED ORDER — FENTANYL CITRATE PF 50 MCG/ML IJ SOSY
PREFILLED_SYRINGE | INTRAMUSCULAR | Status: AC
  Filled 2021-12-22: qty 1

## 2021-12-22 MED ORDER — TRAMADOL HCL 50 MG PO TABS
50.0000 mg | ORAL_TABLET | Freq: Four times a day (QID) | ORAL | Status: DC | PRN
  Administered 2021-12-23: 50 mg via ORAL
  Filled 2021-12-22: qty 1

## 2021-12-22 MED ORDER — ORAL CARE MOUTH RINSE: 15.0000 mL | Freq: Once | OROMUCOSAL | Status: AC

## 2021-12-22 MED ORDER — CEFAZOLIN SODIUM-DEXTROSE 2-4 GM/100ML-% IV SOLN
2.0000 g | INTRAVENOUS | Status: AC
  Administered 2021-12-22: 2 g via INTRAVENOUS
  Filled 2021-12-22: qty 100

## 2021-12-22 MED ORDER — BUPIVACAINE LIPOSOME 1.3 % IJ SUSP
INTRAMUSCULAR | Status: AC
Start: 2021-12-22 — End: ?
  Filled 2021-12-22: qty 20

## 2021-12-22 MED ORDER — HEPARIN SODIUM (PORCINE) 5000 UNIT/ML IJ SOLN
5000.0000 [IU] | INTRAMUSCULAR | Status: AC
  Administered 2021-12-22: 5000 [IU] via SUBCUTANEOUS
  Filled 2021-12-22: qty 1

## 2021-12-22 MED ORDER — LIDOCAINE 2% (20 MG/ML) 5 ML SYRINGE
INTRAMUSCULAR | Status: DC | PRN
  Administered 2021-12-22: 60 mg via INTRAVENOUS

## 2021-12-22 MED ORDER — LIDOCAINE HCL (PF) 2 % IJ SOLN
INTRAMUSCULAR | Status: AC
  Filled 2021-12-22: qty 5

## 2021-12-22 MED ORDER — ONDANSETRON HCL 4 MG/2ML IJ SOLN
4.0000 mg | Freq: Once | INTRAMUSCULAR | Status: AC | PRN
  Administered 2021-12-22: 4 mg via INTRAVENOUS

## 2021-12-22 MED ORDER — CHLORHEXIDINE GLUCONATE 0.12 % MT SOLN
15.0000 mL | Freq: Once | OROMUCOSAL | Status: AC
  Administered 2021-12-22: 15 mL via OROMUCOSAL

## 2021-12-22 MED ORDER — HYDRALAZINE HCL 20 MG/ML IJ SOLN
INTRAMUSCULAR | Status: AC
Start: 2021-12-22 — End: ?
  Filled 2021-12-22: qty 1

## 2021-12-22 MED ORDER — AMISULPRIDE (ANTIEMETIC) 5 MG/2ML IV SOLN: 10.0000 mg | Freq: Once | INTRAVENOUS | Status: DC | PRN

## 2021-12-22 MED ORDER — LACTATED RINGERS IV SOLN: INTRAVENOUS | Status: DC

## 2021-12-22 MED ORDER — PHENYLEPHRINE 80 MCG/ML (10ML) SYRINGE FOR IV PUSH (FOR BLOOD PRESSURE SUPPORT)
PREFILLED_SYRINGE | INTRAVENOUS | Status: AC
  Filled 2021-12-22: qty 10

## 2021-12-22 MED ORDER — BUPIVACAINE HCL 0.25 % IJ SOLN
INTRAMUSCULAR | Status: AC
  Filled 2021-12-22: qty 1

## 2021-12-22 MED ORDER — KETOROLAC TROMETHAMINE 30 MG/ML IJ SOLN
INTRAMUSCULAR | Status: AC
  Filled 2021-12-22: qty 1

## 2021-12-22 MED ORDER — BUPIVACAINE HCL 0.25 % IJ SOLN
INTRAMUSCULAR | Status: DC | PRN
  Administered 2021-12-22: 35 mL

## 2021-12-22 MED ORDER — PROPOFOL 500 MG/50ML IV EMUL
INTRAVENOUS | Status: DC | PRN
  Administered 2021-12-22: 25 ug/kg/min via INTRAVENOUS

## 2021-12-22 SURGICAL SUPPLY — 74 items
APPLICATOR SURGIFLO ENDO (HEMOSTASIS) IMPLANT
BAG LAPAROSCOPIC 12 15 PORT 16 (BASKET) IMPLANT
BAG RETRIEVAL 12/15 (BASKET) ×1
BLADE SURG SZ10 CARB STEEL (BLADE) IMPLANT
COVER BACK TABLE 60X90IN (DRAPES) ×1 IMPLANT
COVER TIP SHEARS 8 DVNC (MISCELLANEOUS) ×1 IMPLANT
COVER TIP SHEARS 8MM DA VINCI (MISCELLANEOUS) ×1
DERMABOND ADVANCED (GAUZE/BANDAGES/DRESSINGS) ×1
DERMABOND ADVANCED .7 DNX12 (GAUZE/BANDAGES/DRESSINGS) ×1 IMPLANT
DRAPE ARM DVNC X/XI (DISPOSABLE) ×4 IMPLANT
DRAPE COLUMN DVNC XI (DISPOSABLE) ×1 IMPLANT
DRAPE DA VINCI XI ARM (DISPOSABLE) ×4
DRAPE DA VINCI XI COLUMN (DISPOSABLE) ×1
DRAPE SHEET LG 3/4 BI-LAMINATE (DRAPES) ×1 IMPLANT
DRAPE SURG IRRIG POUCH 19X23 (DRAPES) ×1 IMPLANT
DRSG OPSITE POSTOP 4X6 (GAUZE/BANDAGES/DRESSINGS) IMPLANT
DRSG OPSITE POSTOP 4X8 (GAUZE/BANDAGES/DRESSINGS) IMPLANT
ELECT PENCIL ROCKER SW 15FT (MISCELLANEOUS) IMPLANT
ELECT REM PT RETURN 15FT ADLT (MISCELLANEOUS) ×1 IMPLANT
GAUZE 4X4 16PLY ~~LOC~~+RFID DBL (SPONGE) ×2 IMPLANT
GLOVE BIO SURGEON STRL SZ 6 (GLOVE) ×4 IMPLANT
GLOVE BIO SURGEON STRL SZ 6.5 (GLOVE) ×1 IMPLANT
GOWN STRL REUS W/ TWL LRG LVL3 (GOWN DISPOSABLE) ×4 IMPLANT
GOWN STRL REUS W/TWL LRG LVL3 (GOWN DISPOSABLE) ×4
HEMOSTAT ARISTA ABSORB 3G PWDR (HEMOSTASIS) IMPLANT
HOLDER FOLEY CATH W/STRAP (MISCELLANEOUS) IMPLANT
IRRIG SUCT STRYKERFLOW 2 WTIP (MISCELLANEOUS) ×1
IRRIGATION SUCT STRKRFLW 2 WTP (MISCELLANEOUS) ×1 IMPLANT
KIT PROCEDURE DA VINCI SI (MISCELLANEOUS)
KIT PROCEDURE DVNC SI (MISCELLANEOUS) IMPLANT
KIT TURNOVER KIT A (KITS) IMPLANT
LIGASURE IMPACT 36 18CM CVD LR (INSTRUMENTS) IMPLANT
MANIPULATOR ADVINCU DEL 3.0 PL (MISCELLANEOUS) IMPLANT
MANIPULATOR ADVINCU DEL 3.5 PL (MISCELLANEOUS) IMPLANT
MANIPULATOR UTERINE 4.5 ZUMI (MISCELLANEOUS) IMPLANT
NDL HYPO 21X1.5 SAFETY (NEEDLE) ×1 IMPLANT
NDL SPNL 18GX3.5 QUINCKE PK (NEEDLE) IMPLANT
NEEDLE HYPO 21X1.5 SAFETY (NEEDLE) ×1 IMPLANT
NEEDLE SPNL 18GX3.5 QUINCKE PK (NEEDLE) IMPLANT
OBTURATOR OPTICAL STANDARD 8MM (TROCAR) ×1
OBTURATOR OPTICAL STND 8 DVNC (TROCAR) ×1
OBTURATOR OPTICALSTD 8 DVNC (TROCAR) ×1 IMPLANT
PACK ROBOT GYN CUSTOM WL (TRAY / TRAY PROCEDURE) ×1 IMPLANT
PAD POSITIONING PINK XL (MISCELLANEOUS) ×1 IMPLANT
PORT ACCESS TROCAR AIRSEAL 12 (TROCAR) ×1 IMPLANT
PORT ACCESS TROCAR AIRSEAL 5M (TROCAR) ×1
SCRUB CHG 4% DYNA-HEX 4OZ (MISCELLANEOUS) ×2 IMPLANT
SEAL CANN UNIV 5-8 DVNC XI (MISCELLANEOUS) ×4 IMPLANT
SEAL XI 5MM-8MM UNIVERSAL (MISCELLANEOUS) ×4
SET TRI-LUMEN FLTR TB AIRSEAL (TUBING) ×1 IMPLANT
SPIKE FLUID TRANSFER (MISCELLANEOUS) ×1 IMPLANT
SPONGE T-LAP 18X18 ~~LOC~~+RFID (SPONGE) IMPLANT
SURGIFLO W/THROMBIN 8M KIT (HEMOSTASIS) IMPLANT
SUT MNCRL AB 4-0 PS2 18 (SUTURE) IMPLANT
SUT PDS AB 1 TP1 96 (SUTURE) IMPLANT
SUT VIC AB 0 CT1 27 (SUTURE)
SUT VIC AB 0 CT1 27XBRD ANTBC (SUTURE) IMPLANT
SUT VIC AB 2-0 CT1 27 (SUTURE)
SUT VIC AB 2-0 CT1 TAPERPNT 27 (SUTURE) IMPLANT
SUT VIC AB 4-0 PS2 18 (SUTURE) ×2 IMPLANT
SUT VLOC 180 0 9IN  GS21 (SUTURE) ×1
SUT VLOC 180 0 9IN GS21 (SUTURE) IMPLANT
SYR 10ML LL (SYRINGE) IMPLANT
SYR BULB IRRIG 60ML STRL (SYRINGE) IMPLANT
SYS BAG RETRIEVAL 10MM (BASKET) ×1
SYS WOUND ALEXIS 18CM MED (MISCELLANEOUS)
SYSTEM BAG RETRIEVAL 10MM (BASKET) IMPLANT
SYSTEM WOUND ALEXIS 18CM MED (MISCELLANEOUS) IMPLANT
TOWEL OR NON WOVEN STRL DISP B (DISPOSABLE) IMPLANT
TRAP SPECIMEN MUCUS 40CC (MISCELLANEOUS) IMPLANT
TRAY FOLEY MTR SLVR 16FR STAT (SET/KITS/TRAYS/PACK) ×1 IMPLANT
UNDERPAD 30X36 HEAVY ABSORB (UNDERPADS AND DIAPERS) ×2 IMPLANT
WATER STERILE IRR 1000ML POUR (IV SOLUTION) ×1 IMPLANT
YANKAUER SUCT BULB TIP 10FT TU (MISCELLANEOUS) IMPLANT

## 2021-12-22 NOTE — Brief Op Note (Signed)
12/22/2021  6:18 PM  PATIENT:  Kim Castro  76 y.o. female  PRE-OPERATIVE DIAGNOSIS:  ADNEXAL MASS THICKENED ENDOMETRIUM  POST-OPERATIVE DIAGNOSIS:  ADNEXAL MASS THICKENED ENDOMETRIUM  PROCEDURE:  Procedure(s): XI ROBOTIC ASSISTED TOTAL HYSTERECTOMY WITH BILATERAL SALPINGO OOPHORECTOMY (Bilateral)  SURGEON:  Surgeon(s) and Role:    [name], MD - Primary  ASSISTANTS: Joylene John NP   ANESTHESIA:   general  EBL:  50 mL   BLOOD ADMINISTERED:none  DRAINS: none   LOCAL MEDICATIONS USED:  MARCAINE     SPECIMEN:  uterus, cervix, tubes and ovaries, peritoneal nodules  DISPOSITION OF SPECIMEN:  PATHOLOGY  COUNTS:  YES  TOURNIQUET:  * No tourniquets in log *  DICTATION: .Note written in EPIC  PLAN OF CARE: Discharge to home after PACU  PATIENT DISPOSITION:  PACU - hemodynamically stable.   Delay start of Pharmacological VTE agent (>24hrs) due to surgical blood loss or risk of bleeding: not applicable

## 2021-12-22 NOTE — Anesthesia Postprocedure Evaluation (Signed)
Anesthesia Post Note  Patient: Kim Castro  Procedure(s) Performed: XI ROBOTIC ASSISTED TOTAL HYSTERECTOMY WITH BILATERAL SALPINGO OOPHORECTOMY (Bilateral)     Patient location during evaluation: PACU Anesthesia Type: General Level of consciousness: awake Pain management: pain level controlled Vital Signs Assessment: post-procedure vital signs reviewed and stable Respiratory status: spontaneous breathing, nonlabored ventilation, respiratory function stable and patient connected to nasal cannula oxygen Cardiovascular status: blood pressure returned to baseline and stable Postop Assessment: no apparent nausea or vomiting Anesthetic complications: no   No notable events documented.  Last Vitals:  Vitals:   12/22/21 2045 12/22/21 2100  BP: (!) 174/82 (!) 180/73  Pulse: 60 64  Resp: 18 16  Temp:  36.4 C  SpO2: 95% 97%    Last Pain:  Vitals:   12/22/21 2045  TempSrc:   PainSc: 3                  Korrin [place]field P Majestic Brister

## 2021-12-22 NOTE — Anesthesia Procedure Notes (Signed)
Procedure Name: Intubation Date/Time: 12/22/2021 4:10 PM  Performed by: Niel Hummer, CRNAPre-anesthesia Checklist: Patient identified, Emergency Drugs available, Suction available and Patient being monitored Patient Re-evaluated:Patient Re-evaluated prior to induction Oxygen Delivery Method: Circle system utilized Preoxygenation: Pre-oxygenation with 100% oxygen Induction Type: IV induction Ventilation: Mask ventilation without difficulty Laryngoscope Size: Mac and 4 Grade View: Grade II Tube type: Oral Tube size: 7.0 mm Number of attempts: 1 Airway Equipment and Method: Stylet Placement Confirmation: ETT inserted through vocal cords under direct vision, breath sounds checked- equal and bilateral and positive ETCO2 Secured at: 22 cm Tube secured with: Tape Dental Injury: Teeth and Oropharynx as per pre-operative assessment

## 2021-12-22 NOTE — Progress Notes (Signed)
Patient was updated that her surgeon is delayed possibly another hour.  Patient voiced understanding and is gong to call her husband to update him on her cell phone.

## 2021-12-22 NOTE — Transfer of Care (Signed)
Immediate Anesthesia Transfer of Care Note  Patient: Kim Castro  Procedure(s) Performed: XI ROBOTIC ASSISTED TOTAL HYSTERECTOMY WITH BILATERAL SALPINGO OOPHORECTOMY (Bilateral)  Patient Location: PACU  Anesthesia Type:General  Level of Consciousness: awake, alert  and oriented  Airway & Oxygen Therapy: Patient Spontanous Breathing and Patient connected to face mask oxygen  Post-op Assessment: Report given to RN and Post -op Vital signs reviewed and stable  Post vital signs: Reviewed and stable  Last Vitals:  Vitals Value Taken Time  BP 164/82 12/22/21 1828  Temp    Pulse 57 12/22/21 1830  Resp 16 12/22/21 1830  SpO2 99 % 12/22/21 1830  Vitals shown include unvalidated device data.  Last Pain:  Vitals:   12/22/21 1223  TempSrc:   PainSc: 0-No pain         Complications: No notable events documented.

## 2021-12-22 NOTE — Discharge Instructions (Signed)
AFTER SURGERY INSTRUCTIONS   Return to work: 4-6 weeks if applicable  You need to increase your fluid intake to stay hydrated and help with your blood pressure. DO NOT TAKE YOUR BLOOD PRESSURE MEDICATION UNTIL YOUR BLOOD PRESSURE IS BACK TO YOUR NORMAL. WE WILL CALL YOU TOMORROW TO CHECK IN AND DISCUSS THE BP READINGS YOU ARE GETTING AT HOME.   Activity: 1. Be up and out of the bed during the day.  Take a nap if needed.  You may walk up steps but be careful and use the hand rail.  Stair climbing will tire you more than you think, you may need to stop part way and rest.    2. No lifting or straining for 6 weeks over 10 pounds. No pushing, pulling, straining for 6 weeks.   3. No driving for around 1 week(s).  Do not drive if you are taking narcotic pain medicine and make sure that your reaction time has returned.    4. You can shower as soon as the next day after surgery. Shower daily.  Use your regular soap and water (not directly on the incision) and pat your incision(s) dry afterwards; don't rub.  No tub baths or submerging your body in water until cleared by your surgeon. If you have the soap that was given to you by pre-surgical testing that was used before surgery, you do not need to use it afterwards because this can irritate your incisions.    5. No sexual activity and nothing in the vagina for 10 weeks since you had a hysterectomy (removal of the uterus and cervix).   6. You may experience a small amount of clear drainage from your incisions, which is normal.  If the drainage persists, increases, or changes color please call the office.   7. Do not use creams, lotions, or ointments such as neosporin on your incisions after surgery until advised by your surgeon because they can cause removal of the dermabond glue on your incisions.     8. You may experience vaginal spotting after surgery or around the 6-8 week mark from surgery when the stitches at the top of the vagina begin to dissolve.   The spotting is normal but if you experience heavy bleeding, call our office.   9. Take Tylenol or ibuprofen first for pain if you are able to take these medications and only use narcotic pain medication for severe pain not relieved by the Tylenol or Ibuprofen.  Monitor your Tylenol intake to a max of 4,000 mg in a 24 hour period. You can alternate these medications after surgery.   Diet: 1. Low sodium Heart Healthy Diet is recommended but you are cleared to resume your normal (before surgery) diet after your procedure.   2. It is safe to use a laxative, such as Miralax or Colace, if you have difficulty moving your bowels. You have been prescribed Sennakot-S to take at bedtime every evening after surgery to keep bowel movements regular and to prevent constipation.     Wound Care: 1. Keep clean and dry.  Shower daily.   Reasons to call the Doctor: Fever - Oral temperature greater than 100.4 degrees Fahrenheit Foul-smelling vaginal discharge Difficulty urinating Nausea and vomiting Increased pain at the site of the incision that is unrelieved with pain medicine. Difficulty breathing with or without chest pain New calf pain especially if only on one side Sudden, continuing increased vaginal bleeding with or without clots.   Contacts: For questions or concerns you  should contact:   Dr. Jeral Pinch at [phone]   Joylene John, NP at [phone]   After Hours: call [phone] and have the GYN Oncologist paged/contacted (after 5 pm or on the weekends).   Messages sent via mychart are for non-urgent matters and are not responded to after hours so for urgent needs, please call the after hours number.

## 2021-12-22 NOTE — Anesthesia Preprocedure Evaluation (Signed)
Anesthesia Evaluation  Patient identified by MRN, date of birth, ID band Patient awake    Reviewed: Allergy & Precautions, NPO status , Patient's Chart, lab work & pertinent test results  Airway Mallampati: IV  TM Distance: >3 FB Neck ROM: Full    Dental no notable dental hx.    Pulmonary PE   Pulmonary exam normal        Cardiovascular hypertension, Pt. on medications Normal cardiovascular exam     Neuro/Psych negative neurological ROS  negative psych ROS   GI/Hepatic Neg liver ROS, hiatal hernia, GERD  Medicated and Controlled,  Endo/Other  diabetes, Oral Hypoglycemic AgentsMorbid obesity  Renal/GU negative Renal ROS     Musculoskeletal  (+) Arthritis ,   Abdominal (+) + obese,   Peds  Hematology negative hematology ROS (+)   Anesthesia Other Findings ADNEXAL MASS  THICKENED ENDOMETRIUM  Reproductive/Obstetrics                             Anesthesia Physical Anesthesia Plan  ASA: 3  Anesthesia Plan: General   Post-op Pain Management:    Induction: Intravenous  PONV Risk Score and Plan: 4 or greater and Ondansetron, Dexamethasone, Amisulpride, Treatment may vary due to age or medical condition and Propofol infusion  Airway Management Planned: Oral ETT  Additional Equipment:   Intra-op Plan:   Post-operative Plan: Extubation in OR  Informed Consent: I have reviewed the patients History and Physical, chart, labs and discussed the procedure including the risks, benefits and alternatives for the proposed anesthesia with the patient or authorized representative who has indicated his/her understanding and acceptance.     Dental advisory given  Plan Discussed with: CRNA  Anesthesia Plan Comments:         Anesthesia Quick Evaluation

## 2021-12-22 NOTE — Op Note (Signed)
OPERATIVE NOTE  Pre-operative Diagnosis: adnexal mass, suspected recurrent colon cancer, thickened endometrium with negative biopsy pre-op but recurrent PMB  Post-operative Diagnosis: same, adenocarcinoma on frozen section, suspected colon cancer  Operation: Robotic-assisted laparoscopic total hysterectomy with bilateral salpingoophorectomy  Surgeon: Jeral Pinch MD  Assistant Surgeon: Joylene John NP  Anesthesia: GET  Urine Output: 200 cc  Operative Findings:  On EUA, Enlarged mobile uterus, smooth mass in the cul-de-sac.  On intra-abdominal entry.  Normal-appearing stomach, liver edge, and diaphragm.  Omentum adherent to the anterior abdominal wall at the level of prior supraumbilical mini laparotomy, just consistent with prior right colon surgery.  Normal-appearing omentum otherwise as well as small and large bowel.  Uterus approximately 12 cm and bulbous in appearance.  Right adnexa somewhat atrophic.  Left ovary replaced by a 6 cm cystic mass with some surface nodularity.  Fallopian tubes with evidence of prior tubal ligation.  Some peritoneal implants along pelvic peritoneum, anterior cul-de-sac, posterior cul-de-sac.  Several of these lesions excised, remainder cauterized.  Adhesions noted between the bladder and anterior uterus.  Minimal ascites in the pelvis. Frozen section consistent with adenocarcinoma, consistent with her history of colon cancer.  Further characterization deferred to permanent section.  Estimated Blood Loss:  50 cc      Total IV Fluids: see I&O flowsheet         Specimens: uterus, cervix, bilateral tubes, peritoneal nodules from anterior cul de sac         Complications:  None apparent; patient tolerated the procedure well.         Disposition: PACU - hemodynamically stable.  Procedure Details  The patient was seen in the Holding Room. The risks, benefits, complications, treatment options, and expected outcomes were discussed with the patient.  The  patient concurred with the proposed plan, giving informed consent.  The site of surgery properly noted/marked. The patient was identified as Kim Castro and the procedure verified as a Robotic-assisted hysterectomy with bilateral salpingo oophorectomy.   After induction of anesthesia, the patient was draped and prepped in the usual sterile manner. Patient was placed in supine position after anesthesia and draped and prepped in the usual sterile manner as follows: Her arms were tucked to her side with all appropriate precautions.  The shoulders were stabilized with padded shoulder blocks applied to the acromium processes.  The patient was placed in the semi-lithotomy position in [place].  The perineum and vagina were prepped with CholoraPrep. The patient was draped after the CholoraPrep had been allowed to dry for 3 minutes.  A Time Out was held and the above information confirmed.  The urethra was prepped with Betadine. Foley catheter was placed.  A sterile speculum was placed in the vagina.  The cervix was grasped with a single-tooth tenaculum. The cervix was dilated with Kennon Rounds dilators.  The 3.5 Delineator uterine manipulator with a colpotomizer ring was placed without difficulty.  A pneum occluder balloon was placed over the manipulator.  OG tube placement was confirmed and to suction.   Next, a 10 mm skin incision was made 1 cm below the subcostal margin in the midclavicular line.  The 5 mm Optiview port and scope was used for direct entry.  Opening pressure was under 10 mm CO2.  The abdomen was insufflated and the findings were noted as above.   At this point and all points during the procedure, the patient's intra-abdominal pressure did not exceed 15 mmHg. Next, an 8 mm skin incision was made at the  level of the umbilicus and a right and left port were placed about 8 cm lateral to the robot port on the right and left side.  A fourth arm was placed on the right.  The 5 mm assist trocar was left  where it was.  Given the adhesions from prior surgery, the assist 10-12 port was placed in the mid left abdomen, inferior to these adhesions.  All ports were placed under direct visualization.  The patient was placed in steep Trendelenburg.  The robot was docked in the normal manner.  Adhesions of the left colon to the pelvic and abdominal sidewall were lysed.  The right and left peritoneum were opened parallel to the IP ligament to open the retroperitoneal spaces bilaterally. The round ligaments were transected. The ureter was again noted to be on the medial leaf of the broad ligament.  The infundibulopelvic ligaments were skeletonized, cauterized, and transected.  On the left, the utero-ovarian ligament was also skeletonized, cauterized, and transected, freeing this adnexa.  The left adnexa was placed in a 10 mm Endo Catch bag.  The posterior peritoneum was taken down to the level of the KOH ring.  The anterior peritoneum was also taken down.  The bladder flap was created to the level of the KOH ring.  The uterine artery on the right side was skeletonized, cauterized and cut in the normal manner.  A similar procedure was performed on the left.  The colpotomy was made and the uterus, cervix, and the right tube and ovary were amputated, in an Endo Catch bag, and delivered through the vagina.  Pedicles were inspected and excellent hemostasis was achieved.  The left adnexa was delivered in the Endo Catch bag through the vagina and sent for frozen section.  The colpotomy at the vaginal cuff was closed with aggravated Vicryl at each apex and 0 V-Loc to close the midline.  Irrigation was used and excellent hemostasis was achieved.    Section returned showing adenocarcinoma, likely consistent with prior history of colon cancer.  Several of the peritoneal implants were excised from the anterior cul-de-sac.  Remainder of these peritoneal implants were cauterized.  Given some raw surfaces in the pelvis, Arista was  placed over the surgical beds.  At this point in the procedure was completed.  Robotic instruments were removed under direct visulaization.  The robot was undocked. The fascia at the 10-12 mm port was closed with 0 Vicryl on a UR-5 needle.  The subcuticular tissue was closed with 4-0 Vicryl and the skin was closed with 4-0 Monocryl in a subcuticular manner.  Dermabond was applied.    The vagina was swabbed with  minimal bleeding noted.  Foley catheter was removed.  All sponge, lap and needle counts were correct x  3.   The patient was transferred to the recovery room in stable condition.  Jeral Pinch, MD

## 2021-12-23 ENCOUNTER — Encounter (HOSPITAL_COMMUNITY): Payer: Self-pay | Admitting: Gynecologic Oncology

## 2021-12-23 ENCOUNTER — Other Ambulatory Visit: Payer: Self-pay

## 2021-12-23 DIAGNOSIS — Z7984 Long term (current) use of oral hypoglycemic drugs: Secondary | ICD-10-CM | POA: Diagnosis not present

## 2021-12-23 DIAGNOSIS — C7963 Secondary malignant neoplasm of bilateral ovaries: Secondary | ICD-10-CM | POA: Diagnosis not present

## 2021-12-23 DIAGNOSIS — C786 Secondary malignant neoplasm of retroperitoneum and peritoneum: Secondary | ICD-10-CM | POA: Diagnosis not present

## 2021-12-23 DIAGNOSIS — N840 Polyp of corpus uteri: Secondary | ICD-10-CM | POA: Diagnosis not present

## 2021-12-23 DIAGNOSIS — R110 Nausea: Secondary | ICD-10-CM

## 2021-12-23 DIAGNOSIS — Z85038 Personal history of other malignant neoplasm of large intestine: Secondary | ICD-10-CM | POA: Diagnosis not present

## 2021-12-23 DIAGNOSIS — I10 Essential (primary) hypertension: Secondary | ICD-10-CM | POA: Diagnosis not present

## 2021-12-23 DIAGNOSIS — K219 Gastro-esophageal reflux disease without esophagitis: Secondary | ICD-10-CM | POA: Diagnosis not present

## 2021-12-23 DIAGNOSIS — C801 Malignant (primary) neoplasm, unspecified: Secondary | ICD-10-CM | POA: Diagnosis not present

## 2021-12-23 DIAGNOSIS — E119 Type 2 diabetes mellitus without complications: Secondary | ICD-10-CM | POA: Diagnosis not present

## 2021-12-23 DIAGNOSIS — N8003 Adenomyosis of the uterus: Secondary | ICD-10-CM | POA: Diagnosis not present

## 2021-12-23 DIAGNOSIS — R9389 Abnormal findings on diagnostic imaging of other specified body structures: Secondary | ICD-10-CM | POA: Diagnosis not present

## 2021-12-23 DIAGNOSIS — Z853 Personal history of malignant neoplasm of breast: Secondary | ICD-10-CM | POA: Diagnosis not present

## 2021-12-23 DIAGNOSIS — Z6841 Body Mass Index (BMI) 40.0 and over, adult: Secondary | ICD-10-CM | POA: Diagnosis not present

## 2021-12-23 LAB — CBC
HCT: 38 % (ref 36.0–46.0)
Hemoglobin: 12.3 g/dL (ref 12.0–15.0)
MCH: 33.2 pg (ref 26.0–34.0)
MCHC: 32.4 g/dL (ref 30.0–36.0)
MCV: 102.7 fL — ABNORMAL HIGH (ref 80.0–100.0)
Platelets: 253 10*3/uL (ref 150–400)
RBC: 3.7 MIL/uL — ABNORMAL LOW (ref 3.87–5.11)
RDW: 13.2 % (ref 11.5–15.5)
WBC: 11.5 10*3/uL — ABNORMAL HIGH (ref 4.0–10.5)
nRBC: 0 % (ref 0.0–0.2)

## 2021-12-23 LAB — HEMOGLOBIN AND HEMATOCRIT, BLOOD
HCT: 38 % (ref 36.0–46.0)
Hemoglobin: 11.9 g/dL — ABNORMAL LOW (ref 12.0–15.0)

## 2021-12-23 LAB — BASIC METABOLIC PANEL
Anion gap: 7 (ref 5–15)
BUN: 11 mg/dL (ref 8–23)
CO2: 23 mmol/L (ref 22–32)
Calcium: 8.5 mg/dL — ABNORMAL LOW (ref 8.9–10.3)
Chloride: 110 mmol/L (ref 98–111)
Creatinine, Ser: 0.7 mg/dL (ref 0.44–1.00)
GFR, Estimated: >60 mL/min (ref >60)
Glucose, Bld: 168 mg/dL — ABNORMAL HIGH (ref 70–99)
Potassium: 4.3 mmol/L (ref 3.5–5.1)
Sodium: 140 mmol/L (ref 135–145)

## 2021-12-23 LAB — URINE CULTURE: Culture: NO GROWTH

## 2021-12-23 MED ORDER — ACETAMINOPHEN 325 MG PO TABS
650.0000 mg | ORAL_TABLET | Freq: Four times a day (QID) | ORAL | Status: DC | PRN
  Administered 2021-12-23: 650 mg via ORAL
  Filled 2021-12-23: qty 2

## 2021-12-23 MED ORDER — PREGABALIN 25 MG PO CAPS
25.0000 mg | ORAL_CAPSULE | Freq: Every day | ORAL | Status: DC
  Administered 2021-12-23: 25 mg via ORAL
  Filled 2021-12-23: qty 1

## 2021-12-23 MED ORDER — METFORMIN HCL 500 MG PO TABS
500.0000 mg | ORAL_TABLET | Freq: Two times a day (BID) | ORAL | Status: DC
  Administered 2021-12-23: 500 mg via ORAL
  Filled 2021-12-23: qty 1

## 2021-12-23 MED ORDER — PANTOPRAZOLE SODIUM 40 MG PO TBEC
40.0000 mg | DELAYED_RELEASE_TABLET | Freq: Every day | ORAL | Status: DC
  Administered 2021-12-23: 40 mg via ORAL
  Filled 2021-12-23: qty 1

## 2021-12-23 NOTE — Progress Notes (Signed)
GYN Oncology Progress Note  Patient reports doing well. Tolerating diet with no nausea or emesis. Voiding without difficulty. Incisional pain minimal. Ambulating without difficulty. Questions answered in relation to her BP. She states she does not feel she has drank enough fluids and she will increase this. She has a way to monitor her BP at home and will do this on a regular basis. Advised to hold her blood pressure medications and our office will check with her tomorrow to see how her BP is running. Will plan to get an H&H prior to discharge to assess for stability. Last urine output documented at 9 am but patient states she has voided an adequate amount since then.

## 2021-12-23 NOTE — Interval H&P Note (Signed)
History and Physical Interval Note:  12/23/2021 9:03 AM  Kim Castro  has presented today for surgery, with the diagnosis of ADNEXAL MASS THICKENED ENDOMETRIUM.  The various methods of treatment have been discussed with the patient and family. After consideration of risks, benefits and other options for treatment, the patient has consented to  Procedure(s): XI ROBOTIC ASSISTED TOTAL HYSTERECTOMY WITH BILATERAL SALPINGO OOPHORECTOMY (Bilateral) as a surgical intervention.  The patient's history has been reviewed, patient examined, no change in status, stable for surgery.  I have reviewed the patient's chart and labs.  Questions were answered to the patient's satisfaction.     [name]

## 2021-12-23 NOTE — TOC Progression Note (Signed)
Transition of Care [facility]) - Progression Note    Patient Details  Name: Kim Castro MRN: [id number] Date of Birth: 02-16-1946  Transition of Care [facility]) CM/SW Contact  Servando Snare, [place] Phone Number: 12/23/2021, 10:15 AM  Clinical Narrative:       Transition of Care [facility]) Screening Note   Patient Details  Name: Kim Castro Date of Birth: April 19, 1946   Transition of Care [facility]) CM/SW Contact:    Servando Snare, LCSW Phone Number: 12/23/2021, 10:15 AM    Transition of Care Department [facility]) has reviewed patient and no TOC needs have been identified at this time. We will continue to monitor patient advancement through interdisciplinary progression rounds. If new patient transition needs arise, please place a TOC consult.        Expected Discharge Plan and Services           Expected Discharge Date: 12/22/21                                     Social Determinants of Health (SDOH) Interventions    Readmission Risk Interventions     No data to display

## 2021-12-23 NOTE — Progress Notes (Signed)
1 Day Post-Op Procedure(s) (LRB): XI ROBOTIC ASSISTED TOTAL HYSTERECTOMY WITH BILATERAL SALPINGO OOPHORECTOMY (Bilateral)  Subjective: Patient reports resolution of the nausea experienced post-op. She has not taken in liquids or solid food due to fear of worsening nausea. Voiding without difficulty. Feels steady when out of bed. No flatus reported. Chronic dizziness remains, unchanged from preop status. She reports the dizziness when first sitting or standing then it resolves quickly. Denies chest pain, dyspnea. No concerns voiced.    Objective: Vital signs in last 24 hours: Temp:  [97.4 F (36.3 C)-98.9 F (37.2 C)] 98.1 F (36.7 C) (08/24 0504) Pulse Rate:  [57-83] 64 (08/24 0504) Resp:  [13-25] 18 (08/24 0504) BP: (132-187)/(52-119) 134/56 (08/24 0504) SpO2:  [90 %-100 %] 99 % (08/24 0504) Weight:  [247 lb 5.7 oz (112.2 kg)] 247 lb 5.7 oz (112.2 kg) (08/23 1122)    Intake/Output from previous day: 08/23 0701 - 08/24 0700 In: 1780.8 [P.O.:60; I.V.:1420.8; IV Piggyback:200] Out: 1150 [Urine:1100; Blood:50]  Physical Examination (performed by Dr. Berline Lopes): General: alert, cooperative, and no distress Resp: clear to auscultation bilaterally Cardio: regular rate and rhythm, S1, S2 normal, no murmur, click, rub or gallop GI: incision: lap sites to the abdomen with dermabond intact without drainage and abdomen soft, active bowel sounds, non distended Extremities: extremities normal, atraumatic, no cyanosis or edema Right hand and lower forearm edematous. IV in forearm-IVF stopped.  Labs: WBC/Hgb/Hct/Plts:  11.5/12.3/38.0/253 (08/24 1610) BUN/Cr/glu/ALT/AST/amyl/lip:  11/0.70/--/--/--/--/-- (08/24 9604)  Assessment: 76 y.o. s/p Procedure(s): XI ROBOTIC ASSISTED TOTAL HYSTERECTOMY WITH BILATERAL SALPINGO OOPHORECTOMY: stable Pain:  Pain is well-controlled on PRN medications.  Heme: Hgb 12.3 and Hct 38 this am-appropriate compared with pre-op values and surgical losses.  ID: WBC  11.5-felt to be reactive. Decadron given intraop. No evidence of infection.  CV: BP and HR stable. Continue to monitor with ordered vital signs.   GI:  Tolerating po: Not at the moment due to patient choice. Post-op nausea has improved. Patient encouraged to increase PO intake.   GU: Adequate output reported last pm. Creatinine 0.70 this am.     FEN: No critical values on am labs.  Endo: Type 2 Diabetes, under control. CBG (last 3)  Recent Labs    12/22/21 1134 12/22/21 1829  GLUCAP 114* 158*     Prophylaxis: SCDs and lovenox to be given this evening if still admitted  Plan: Saline lock IV Encouraged to increase oral intake with diet as tolerated Encouraged increasing mobility If patient meeting milestones later today, plan for discharge home Continue plan of care per Dr. Berline Lopes   LOS: 0 days    Kim Castro 12/23/2021, 9:08 AM

## 2021-12-23 NOTE — Discharge Summary (Addendum)
Physician Discharge Summary  Patient ID: Kim Castro MRN: [id number] DOB/AGE: 1945-09-20 76 y.o.  Admit date: 12/22/2021 Discharge date: 12/23/2021  Admission Diagnoses: Postoperative state  Discharge Diagnoses:  Principal Problem:   Postoperative state Active Problems:   Right colon cancer   Adnexal mass   Thickened endometrium   Nausea without vomiting   Discharged Condition:  The patient is in good condition and stable for discharge.    Hospital Course: On 12/22/2021, the patient underwent the following: Procedure(s): XI ROBOTIC ASSISTED TOTAL HYSTERECTOMY WITH BILATERAL SALPINGO OOPHORECTOMY. The postoperative course was uneventful except for post-op nausea requiring overnight stay for IVF, am labs.  She was discharged to home on postoperative day 1 tolerating a regular diet, ambulating, pain controlled. A repeat H&H was performed prior to discharge to assess for stability given slightly lower BP.   Consults: None  Significant Diagnostic Studies: Am labs  Treatments: Surgery: see above  Discharge Exam: Blood pressure (!) 98/44, pulse 61, temperature 98.7 F (37.1 C), temperature source Oral, resp. rate 18, height '5\' 5"'$  (1.651 m), weight 247 lb 5.7 oz (112.2 kg), SpO2 96 %. See am labs  Disposition: Discharge disposition: 01-Home or Self Care       Discharge Instructions     Call MD for:  difficulty breathing, headache or visual disturbances   Complete by: As directed    Call MD for:  extreme fatigue   Complete by: As directed    Call MD for:  hives   Complete by: As directed    Call MD for:  persistant dizziness or light-headedness   Complete by: As directed    Call MD for:  persistant nausea and vomiting   Complete by: As directed    Call MD for:  redness, tenderness, or signs of infection (pain, swelling, redness, odor or green/yellow discharge around incision site)   Complete by: As directed    Call MD for:  severe uncontrolled pain   Complete by:  As directed    Call MD for:  temperature >100.4   Complete by: As directed    Diet - low sodium heart healthy   Complete by: As directed    Driving Restrictions   Complete by: As directed    No driving for 1 week(s).  Do not take narcotics and drive. You need to make sure your reaction time has returned.   Increase activity slowly   Complete by: As directed    Lifting restrictions   Complete by: As directed    No lifting greater than 10 lbs, pushing, pulling, straining for 6 weeks.   Sexual Activity Restrictions   Complete by: As directed    No sexual activity, nothing in the vagina, for 10 weeks.      Allergies as of 12/23/2021       Reactions   Oxycodone Nausea And Vomiting        Medication List     TAKE these medications    acetaminophen 325 MG tablet Commonly known as: TYLENOL Take 650 mg by mouth every 6 (six) hours as needed for moderate pain.   b complex vitamins capsule Take 1 capsule by mouth daily.   lidocaine-prilocaine cream Commonly known as: EMLA Apply to affected area once What changed:  how much to take how to take this when to take this reasons to take this additional instructions   loratadine 10 MG tablet Commonly known as: CLARITIN Take 10 mg by mouth daily as needed for allergies.   losartan  25 MG tablet Commonly known as: COZAAR Take 1 tablet (25 mg total) by mouth daily.   metFORMIN 500 MG tablet Commonly known as: GLUCOPHAGE Take 1 tablet (500 mg total) by mouth in the morning and at bedtime.   multivitamin with minerals tablet Take 1 tablet by mouth daily.   omeprazole 20 MG capsule Commonly known as: PRILOSEC Take 20 mg by mouth daily before breakfast.   pravastatin 10 MG tablet Commonly known as: PRAVACHOL Take 1 tablet (10 mg total) by mouth daily.   pregabalin 25 MG capsule Commonly known as: LYRICA Take 1 capsule by mouth once daily   senna-docusate 8.6-50 MG tablet Commonly known as: Senokot-S Take 2 tablets  by mouth at bedtime. For AFTER surgery, do not take if having diarrhea   traMADol 50 MG tablet Commonly known as: ULTRAM Take 1 tablet (50 mg total) by mouth every 6 (six) hours as needed for severe pain. For AFTER surgery only, do not take and drive   Vitamin D 50 MCG (2000 UT) Caps Take 2,000 Units by mouth daily.        Follow-up Information     [name], MD Follow up on 12/31/2021.   Specialty: Gynecologic Oncology Why: at 4pm will be a PHONE visit with Dr. Berline Lopes to check in and discuss final pathology. IN PERSON visit will be on 01/14/22 at [place] at the [facility]. Contact information: [place] [address] [phone]         Truitt Merle, MD Follow up on 01/13/2022.   Specialties: Hematology, Oncology Why: Labs at 7:30 and see Dr. Burr Medico at 8 am Contact information: [place] [address] [phone]                 Greater than thirty minutes were spend for face to face discharge instructions and discharge orders/summary in EPIC.   Signed: Dorothyann Gibbs 12/23/2021, 3:42 PM

## 2021-12-23 NOTE — Progress Notes (Signed)
Discharge instructions discussed with patient and family, verbalized agreement and understanding 

## 2021-12-24 ENCOUNTER — Telehealth: Payer: Self-pay | Admitting: *Deleted

## 2021-12-24 LAB — CYTOLOGY - NON PAP

## 2021-12-24 NOTE — Telephone Encounter (Signed)
Spoke with Kim Castro this morning. She states she is eating, drinking and urinating well. She has not had a BM yet but is passing gas. She is taking senokot as prescribed and encouraged her to drink plenty of water. She denies fever or chills. Incisions are dry and intact. She rates her pain 2/10. Her pain is controlled with Tylenol. She has not taken any Tramadol   Instructed to call office with any fever, chills, purulent drainage, uncontrolled pain or any other questions or concerns. Patient verbalizes understanding.   Pt aware of post op appointments as well as the office number [phone] and after hours number [phone] to call if she has any questions or concerns

## 2021-12-24 NOTE — Telephone Encounter (Signed)
Attempted to speak with pt post op this morning. Unable to reach her. LVM for return call.

## 2021-12-27 LAB — SURGICAL PATHOLOGY

## 2021-12-28 ENCOUNTER — Encounter: Payer: Self-pay | Admitting: Gynecologic Oncology

## 2021-12-28 ENCOUNTER — Inpatient Hospital Stay: Payer: Medicare Other | Attending: Hematology | Admitting: Gynecologic Oncology

## 2021-12-28 DIAGNOSIS — N9489 Other specified conditions associated with female genital organs and menstrual cycle: Secondary | ICD-10-CM

## 2021-12-28 DIAGNOSIS — Z90722 Acquired absence of ovaries, bilateral: Secondary | ICD-10-CM

## 2021-12-28 DIAGNOSIS — Z90710 Acquired absence of both cervix and uterus: Secondary | ICD-10-CM

## 2021-12-28 DIAGNOSIS — Z7189 Other specified counseling: Secondary | ICD-10-CM

## 2021-12-28 DIAGNOSIS — R9389 Abnormal findings on diagnostic imaging of other specified body structures: Secondary | ICD-10-CM

## 2021-12-28 DIAGNOSIS — C182 Malignant neoplasm of ascending colon: Secondary | ICD-10-CM

## 2021-12-28 NOTE — Progress Notes (Signed)
Gynecologic Oncology Telehealth Note: Gyn-Onc  I connected with Kim Castro on 12/28/21 at  3:30 PM EDT by telephone and verified that I am speaking with the correct person using two identifiers.  I discussed the limitations, risks, security and privacy concerns of performing an evaluation and management service by telemedicine and the availability of in-person appointments. I also discussed with the patient that there may be a patient responsible charge related to this service. The patient expressed understanding and agreed to proceed.  Other persons participating in the visit and their role in the encounter: None.  Patient's location: Home Provider's location: [facility]   Reason for Visit: Follow-up after surgery  Treatment History: Oncology History Overview Note  Cancer Staging Malignant neoplasm of female breast [facility]) Staging form: Breast, AJCC 7th Edition - Clinical stage from 06/11/2014: Stage 0 (Tis (DCIS), N0, M0) - Unsigned Staged by: Pathologist and managing physician Laterality: Left Estrogen receptor status: Positive Progesterone receptor status: Positive Stage used in treatment planning: Yes National guidelines used in treatment planning: Yes Type of national guideline used in treatment planning: NCCN - Pathologic stage from 07/03/2014: Stage Unknown (Tis (DCIS), NX, cM0) - Signed by Enid Cutter, MD on 07/10/2014 Staged by: Pathologist Laterality: Left Estrogen receptor status: Positive Progesterone receptor status: Positive Stage used in treatment planning: Yes National guidelines used in treatment planning: Yes Type of national guideline used in treatment planning: NCCN Staging comments: Staged on final lumpectomy specimen by Dr. Donato Heinz.  right colon cancer Staging form: Colon and Rectum, AJCC 8th Edition - Pathologic stage from 11/01/2019: Stage IIIB (pT3, pN2a, cM0) - Signed by Truitt Merle, MD on 11/29/2019 Stage prefix: Initial diagnosis Histologic grading  system: 4 grade system Histologic grade (G): G2 Residual tumor (R): R0 - None Tumor deposits (TD): Absent Perineural invasion (PNI): Absent Microsatellite instability (MSI): Stable KRAS mutation: Unknown NRAS mutation: Unknown BRAF Mutation: Unknown    Malignant neoplasm of female breast ([place])  05/29/2014 Initial Biopsy   Left breast needle core biopsy: Grade 2, DCIS with calcs. ER+ (100%), PR+ (96%).    06/04/2014 Initial Diagnosis   Left breast DCIS with calcifications, ER 100%, PR 96%   06/10/2014 Breast MRI   Left breast: 2.4 x 1.3 x 1.1 cm area of patchy non-mass enhancement upper outer quadrant includes postbiopsy seroma; Right breast: 1.2 cm previously biopsied stable benign fibroadenoma   06/12/2014 Procedure   Genetic counseling/testing: Identified 1 VUS on CHEK2 gene. Remainder of 17 gene panel tested negative and included: ATM, BARD1, BRCA 1/2, BRIP1, CDH1, CHEK2, EPCAM, MLH1, MSH2, MSH6, NBN, NF1, PALB2, PTEN, RAD50, RAD51C, RAD51D, STK11, and TP53.    07/01/2014 Surgery   Left breast lumpectomy ([facility]): Grade 1, DCIS, spanning 2.3 cm, 1 mm margin, ER 100%, PR 96%   07/31/2014 - 08/28/2014 Radiation Therapy   Adjuvant RT completed Pablo Ledger). Left breast: Total dose 42.5 Gy over 17 fractions. Left breast boost: Total dose 7.5 Gy over 3 fractions.    09/14/2014 - 01/13/2020 Anti-estrogen oral therapy   Anastrazole 88m daily. Planned duration of treatment: 5 years ([place]. Completed in 01/2020.    09/25/2014 Survivorship   Survivorship Care Plan given to patient and reviewed with her in person.    03/02/2021 Imaging   CT CAP  IMPRESSION: 1. No findings of active/recurrent malignancy. Partial right hemicolectomy. 2. Endometrial stripe remains mildly thickened, but endometrial biopsy in May was negative for malignancy. 3. Progressive endplate sclerosis and endplate irregularity at T2-3, probably due to degenerative endplate findings. If the  has referable upper thoracic  pain/symptoms then thoracic spine MRI could be used for further workup. 4. Other imaging findings of potential clinical significance: Mild cardiomegaly. Aortic Atherosclerosis (ICD10-I70.0). Mild mitral valve calcification. Postoperative findings in the left breast with adjacent radiation port anteriorly in the left upper lobe. Tiny pulmonary nodules in the left lower lobe are unchanged from earliest available comparison of 10/17/2019 and probably benign although may merit surveillance. Multilevel lumbar impingement. Mild pelvic floor laxity.   Right colon cancer  09/19/2019 Imaging   CT AP W contrast 09/19/19  IMPRESSION Fullness in the cecum, cannot exclude a mass. No evidence for metastatic disease is identified.    09/24/2019 Procedure   Colonoscopy by Dr Eber Jones 09/24/19 IMPRESSION 1. The colon was redundant  2. Mild diverticulosis was noted through the entire examined colon 3. Single 23m polyp was found in the ascending colon; polypectomy was performed using snare cautery and biopsy forceps 4. Mild diverticulosis was notes in the descending colon and sigmoid colon.  5. Single polyp was found in the sigmoid colon, polypectomy was performed with cold forceps.  6. Single polyp was found in the rectosigmoid colon; polypectomy was performed with cold snare  7. Small internal hemorrhoids  8. Large mass was found at the cecum; multiple biopsies of the area were performed using cold forceps; injection (tattooing) was performed distal to the mass.    09/24/2019 Initial Biopsy   INTERPRETATION AND DIAGNOSIS:  A. Cecum, biopsy:  Invasive moderately differentiated adenocarcinoma.  see comment  B. Polyp @ ascending colon, polypectomy:  Tubular Adenoma  C. Polyp @ sigmoid colon Polypectomy:  hyperplastic polyp.  D. Polyp @ rectosigmoid colon, Polypectomy:  Hyperplastic Polyp      10/16/2019 Imaging   CT Chest IMPRESSION: 1. Multiple small pulmonary nodules measuring 5 mm or less in size in  the lungs. These are nonspecific and are typically considered statistically likely benign. However, given the patient's history of primary malignancy, close attention on follow-up studies is recommended to ensure stability. 2. Aortic atherosclerosis, in addition to right coronary artery disease. Assessment for potential risk factor modification, dietary therapy or pharmacologic therapy may be warranted, if clinically indicated. 3. There are calcifications of the aortic valve and mitral annulus. Echocardiographic correlation for evaluation of potential valvular dysfunction may be warranted if clinically indicated. 4. Small hiatal hernia.   Aortic Atherosclerosis (ICD10-I70.0).   11/01/2019 Initial Diagnosis   Colon cancer ([place]   11/01/2019 Surgery   LAPAROSCOPIC PARTIAL COLECTOMY by Dr TMarcello Mooresand Dr GJohney Maine  11/01/2019 Pathology Results   FINAL MICROSCOPIC DIAGNOSIS:   A. COLON, PROXIMAL RIGHT, COLECTOMY:  - Invasive colonic adenocarcinoma, 5 cm.  - Tumor invades through the muscularis propria into pericolonic tissues.   - Margins of resection are not involved.  - Metastatic carcinoma in (5) of (13) lymph nodes.  - See oncology table.    MSI Stable  Mismatch repair normal  MLH1 - Preserved nuclear expression (greater 50% tumor expression) MSH2 - Preserved nuclear expression (greater 50% tumor expression) MSH6 - Preserved nuclear expression (greater 50% tumor expression) PMS2 - Preserved nuclear expression (greater 50% tumor expression)   11/01/2019 Cancer Staging   Staging form: Colon and Rectum, AJCC 8th Edition - Pathologic stage from 11/01/2019: Stage IIIB (pT3, pN2a, cM0) - Signed by FTruitt Merle MD on 11/29/2019   12/10/2019 Procedure   PAC placed 12/10/19   12/17/2019 - 06/01/2020 Chemotherapy   FOLFOX q2weeks starting in 2 weeks starting 12/17/19. Held 01/27/20-02/10/20 due to b/l PE. Oxaliplatin  held C11-12 due to neuropathy. Completed on 06/01/20   03/02/2021 Imaging   CT  CAP  IMPRESSION: 1. No findings of active/recurrent malignancy. Partial right hemicolectomy. 2. Endometrial stripe remains mildly thickened, but endometrial biopsy in May was negative for malignancy. 3. Progressive endplate sclerosis and endplate irregularity at T2-3, probably due to degenerative endplate findings. If the has referable upper thoracic pain/symptoms then thoracic spine MRI could be used for further workup. 4. Other imaging findings of potential clinical significance: Mild cardiomegaly. Aortic Atherosclerosis (ICD10-I70.0). Mild mitral valve calcification. Postoperative findings in the left breast with adjacent radiation port anteriorly in the left upper lobe. Tiny pulmonary nodules in the left lower lobe are unchanged from earliest available comparison of 10/17/2019 and probably benign although may merit surveillance. Multilevel lumbar impingement. Mild pelvic floor laxity.   08/26/2021 Imaging   EXAM: CT CHEST, ABDOMEN, AND PELVIS WITH CONTRAST  IMPRESSION: 1. Stable examination without new or progressive findings to suggest local recurrence or metastatic disease within the chest, abdomen, or pelvis. 2. Hepatomegaly with hepatic steatosis. 3. Sigmoid colonic diverticulosis without findings of acute diverticulitis. 4. Similar prominent endplate sclerosis and irregularity at T2-T3 is most consistent with Modic type endplate degenerative changes. However, if patient has referable upper thoracic pain consider further workup with thoracic spine MRI. 5. Similar thickening of the endometrial stripe measuring up to 8 mm, which was previously biopsied with results negative for malignancy. 6. Aortic Atherosclerosis (ICD10-I70.0).   11/10/2021 PET scan   IMPRESSION: 1. LEFT ovary is increased in size and is intensely hypermetabolic. While physiologic hypermetabolic ovarian tissue is not uncommon, the enlargement and asymmetric activity warrants further evaluation. Consider  contrast pelvic MRI vs tissue sampling. 2. No evidence of metastatic colorectal carcinoma otherwise. 3. Post RIGHT hemicolectomy anatomy. 4. Evidence of radiation change in the LEFT upper lobe (remote breast cancer).      12/22/2021: Robotic total hysterectomy with BSO in the setting of an adnexal mass, elevated CEA with suspicion for recurrent colon cancer, thickened endometrium with negative biopsy.  Interval History: Doing well. Denies significant pain. Reports baseline bowel and bladder. Denies nausea or emesis.  Minimal bleeding on Friday, denies other bleeding or discharge.  Past Medical/Surgical History: Past Medical History:  Diagnosis Date   Aortic atherosclerosis (HCC)    Arthritis    feet, lower back   Basal cell carcinoma    arm   Breast cancer of upper-outer quadrant of left female breast ([place]) 06/04/2014   Cataract    immature on the left   Colon cancer ([place]) 08/2019   Diabetes mellitus without complication (HCC)    Diverticulosis    Dizziness    > 87yr ago;took Antivert    Family history of anesthesia complication    sister slow to wake up with anesthesia   Family history of breast cancer    Family history of colon cancer    Family history of uterine cancer    GERD (gastroesophageal reflux disease)    takes occasional TUMs   History of bronchitis    > 89[place][age]   History of colon polyps    History of hiatal hernia    Small noted on CT   History of pulmonary embolus (PE)    Hypertension    takes Losartan daily and HCTZ   Iron deficiency anemia    Joint pain    Numbness    to toes on each foot   Peripheral neuropathy    feet and toes   Personal history of  radiation therapy    Pulmonary nodules    Noted on CT   Radiation 07/31/14-08/28/14   Left Breast 20 fxs   Seasonal allergies    takes Claritin prn   Urinary frequency    Vitamin D deficiency    takes VIt D daily    Past Surgical History:  Procedure Laterality Date   BREAST BIOPSY  Bilateral    BREAST LUMPECTOMY Left    BREAST LUMPECTOMY WITH RADIOACTIVE SEED LOCALIZATION Left 07/01/2014   Procedure: LEFT BREAST LUMPECTOMY WITH RADIOACTIVE SEED LOCALIZATION;  Surgeon: Excell Seltzer, MD;  Location: [place];  Service: General;  Laterality: Left;   CATARACT EXTRACTION Right    COLONOSCOPY  09/24/2019   [place]   LAPAROSCOPIC PARTIAL COLECTOMY N/A 11/01/2019   Procedure: LAPAROSCOPIC PARTIAL COLECTOMY;  Surgeon: Leighton Ruff, MD;  Location: [facility];  Service: General;  Laterality: N/A;   POLYPECTOMY     PORTACATH PLACEMENT N/A 12/10/2019   Procedure: INSERTION PORT-A-CATH ULTRASOUND GUIDED IN RIGHT IJ;  Surgeon: Leighton Ruff, MD;  Location: [facility];  Service: General;  Laterality: N/A;   ROBOTIC ASSISTED TOTAL HYSTERECTOMY Bilateral 12/22/2021   Procedure: XI ROBOTIC ASSISTED TOTAL HYSTERECTOMY WITH BILATERAL SALPINGO OOPHORECTOMY;  Surgeon: [name], MD;  Location: [facility];  Service: Gynecology;  Laterality: Bilateral;   TOTAL KNEE ARTHROPLASTY Left 10/24/2012   Procedure: TOTAL KNEE ARTHROPLASTY;  Surgeon: Kerin Salen, MD;  Location: [place];  Service: Orthopedics;  Laterality: Left;   TOTAL KNEE ARTHROPLASTY Right 01/09/2013   Procedure: TOTAL KNEE ARTHROPLASTY;  Surgeon: Kerin Salen, MD;  Location: [place];  Service: Orthopedics;  Laterality: Right;   TUBAL LIGATION      Family History  Problem Relation Age of Onset   Diabetes Mother    Uterine cancer Mother        deceased 46   Hypertension Father    Heart disease Father    Diabetes Sister    Breast cancer Sister 26       currently 68   Hypertension Sister    Diabetes Sister    Breast cancer Sister    Diabetes Sister    Kidney disease Sister    Heart disease Sister    Lupus Sister    Heart disease Sister    Diabetes Sister    Diabetes Sister    Hypertension Brother    Diabetes Brother    Colon cancer Brother 55   Colon polyps Daughter    Thyroid cancer Daughter 47        currently 17; type?   Cancer Maternal Uncle        unk. primary; deceased 37s   Stroke Maternal Grandmother    Esophageal cancer Neg Hx    Rectal cancer Neg Hx    Stomach cancer Neg Hx     Social History   Socioeconomic History   Marital status: Married    Spouse name: Not on file   Number of children: 3   Years of education: Not on file   Highest education level: Not on file  Occupational History   Occupation: Retired    Comment: Dispensing optician  Tobacco Use   Smoking status: Never   Smokeless tobacco: Never  Vaping Use   Vaping Use: Never used  Substance and Sexual Activity   Alcohol use: No   Drug use: No   Sexual activity: Yes    Birth control/protection: Surgical, Post-menopausal  Other Topics Concern   Not on file  Social  History Narrative   Not on file   Social Determinants of Health   Financial Resource Strain: Low Risk  (09/01/2021)   Overall Financial Resource Strain (CARDIA)    Difficulty of Paying Living Expenses: Not hard at all  Food Insecurity: No Food Insecurity (09/01/2021)   Hunger Vital Sign    Worried About Running Out of Food in the Last Year: Never true    Ran Out of Food in the Last Year: Never true  Transportation Needs: No Transportation Needs (09/01/2021)   PRAPARE - Hydrologist (Medical): No    Lack of Transportation (Non-Medical): No  Physical Activity: Insufficiently Active (09/01/2021)   Exercise Vital Sign    Days of Exercise per Week: 2 days    Minutes of Exercise per Session: 20 min  Stress: No Stress Concern Present (09/01/2021)   [place]    Feeling of Stress : Not at all  Social Connections: Moderately Isolated (09/01/2021)   Social Connection and Isolation Panel [NHANES]    Frequency of Communication with Friends and Family: Three times a week    Frequency of Social Gatherings with Friends and Family: Three times a week    Attends  Religious Services: Never    Active Member of Clubs or Organizations: No    Attends Archivist Meetings: Never    Marital Status: Married    Current Medications:  Current Outpatient Medications:    acetaminophen (TYLENOL) 325 MG tablet, Take 650 mg by mouth every 6 (six) hours as needed for moderate pain., Disp: , Rfl:    b complex vitamins capsule, Take 1 capsule by mouth daily., Disp: , Rfl:    Cholecalciferol (VITAMIN D) 2000 UNITS CAPS, Take 2,000 Units by mouth daily. , Disp: , Rfl:    lidocaine-prilocaine (EMLA) cream, Apply to affected area once (Patient taking differently: Apply 1 Application topically daily as needed (prior to port flush).), Disp: 30 g, Rfl: 3   loratadine (CLARITIN) 10 MG tablet, Take 10 mg by mouth daily as needed for allergies., Disp: , Rfl:    losartan (COZAAR) 25 MG tablet, Take 1 tablet (25 mg total) by mouth daily., Disp: 90 tablet, Rfl: 3   metFORMIN (GLUCOPHAGE) 500 MG tablet, Take 1 tablet (500 mg total) by mouth in the morning and at bedtime., Disp: 180 tablet, Rfl: 3   Multiple Vitamins-Minerals (MULTIVITAMIN WITH MINERALS) tablet, Take 1 tablet by mouth daily., Disp: , Rfl:    omeprazole (PRILOSEC) 20 MG capsule, Take 20 mg by mouth daily before breakfast. , Disp: , Rfl:    pravastatin (PRAVACHOL) 10 MG tablet, Take 1 tablet (10 mg total) by mouth daily., Disp: 90 tablet, Rfl: 3   pregabalin (LYRICA) 25 MG capsule, Take 1 capsule by mouth once daily, Disp: 30 capsule, Rfl: 0   senna-docusate (SENOKOT-S) 8.6-50 MG tablet, Take 2 tablets by mouth at bedtime. For AFTER surgery, do not take if having diarrhea, Disp: 30 tablet, Rfl: 0   traMADol (ULTRAM) 50 MG tablet, Take 1 tablet (50 mg total) by mouth every 6 (six) hours as needed for severe pain. For AFTER surgery only, do not take and drive, Disp: 10 tablet, Rfl: 0  Review of Symptoms: Pertinent positives as per HPI.  Physical Exam: Deferred given limitations of phone visit.  Laboratory &  Radiologic Studies: A. LEFT OVARY AND FALLOPIAN TUBE, SALPINGO OOPHORECTOMY:  - Metastatic moderately differentiated colonic adenocarcinoma involving  left ovary  -  Focal ovarian stromal calcification  - Segment of benign fallopian tube   B. UTERUS WITH RIGHT FALLOPIAN TUBE AND OVARY, HYSTERECTOMY AND  SALPINGO-OOPHORECTOMY:  - Metastatic moderately differentiated colonic adenocarcinoma involving  right ovary  - Focal invasive extensive adenomyosis  - Benign endometrial polyps  - Benign proliferative phase endometrium  - Hydrosalpinx of right fallopian tube  - Focal ovarian stromal calcification   C. PERITONEAL DEPOSITS, ANTERIOR CUL DE SAC, BIOPSY:  - Metastatic mucinous adenocarcinoma, consistent with colorectal primary  Assessment & Plan: Kim Castro is a 76 y.o. woman with recurrent adenocarcinoma of the colon.  Patient is doing well from a postoperative standpoint.  Discussed continued expectations and restrictions.  Shared surgical pathology with her.  Bilateral ovaries with evidence of metastatic colon cancer as well as peritoneal implants.  Endometrium showed benign endometrial polyps, no hyperplasia or malignancy.  Patient is aware that I have reached out to her medical oncologist with pathology findings.  I defer to Dr. Burr Medico with regard to adjuvant treatment in the setting of recurrent colon cancer.  I discussed the assessment and treatment plan with the patient. The patient was provided with an opportunity to ask questions and all were answered. The patient agreed with the plan and demonstrated an understanding of the instructions.   The patient was advised to call back or see an in-person evaluation if the symptoms worsen or if the condition fails to improve as anticipated.   8 minutes of total time was spent for this patient encounter, including preparation, phone counseling with the patient and coordination of care, and documentation of the  encounter.   Jeral Pinch, MD  Division of Gynecologic Oncology  Department of Obstetrics and Gynecology  [facility]

## 2021-12-31 ENCOUNTER — Telehealth: Payer: Medicare Other | Admitting: Gynecologic Oncology

## 2022-01-06 IMAGING — DX DG CHEST 2V
2 series · 2 of 2 positions shown · non-contrast
Comparison: 12/10/2019

CLINICAL DATA: Dyspnea for 2 weeks

EXAM:
CHEST - 2 VIEW

[chest pa]
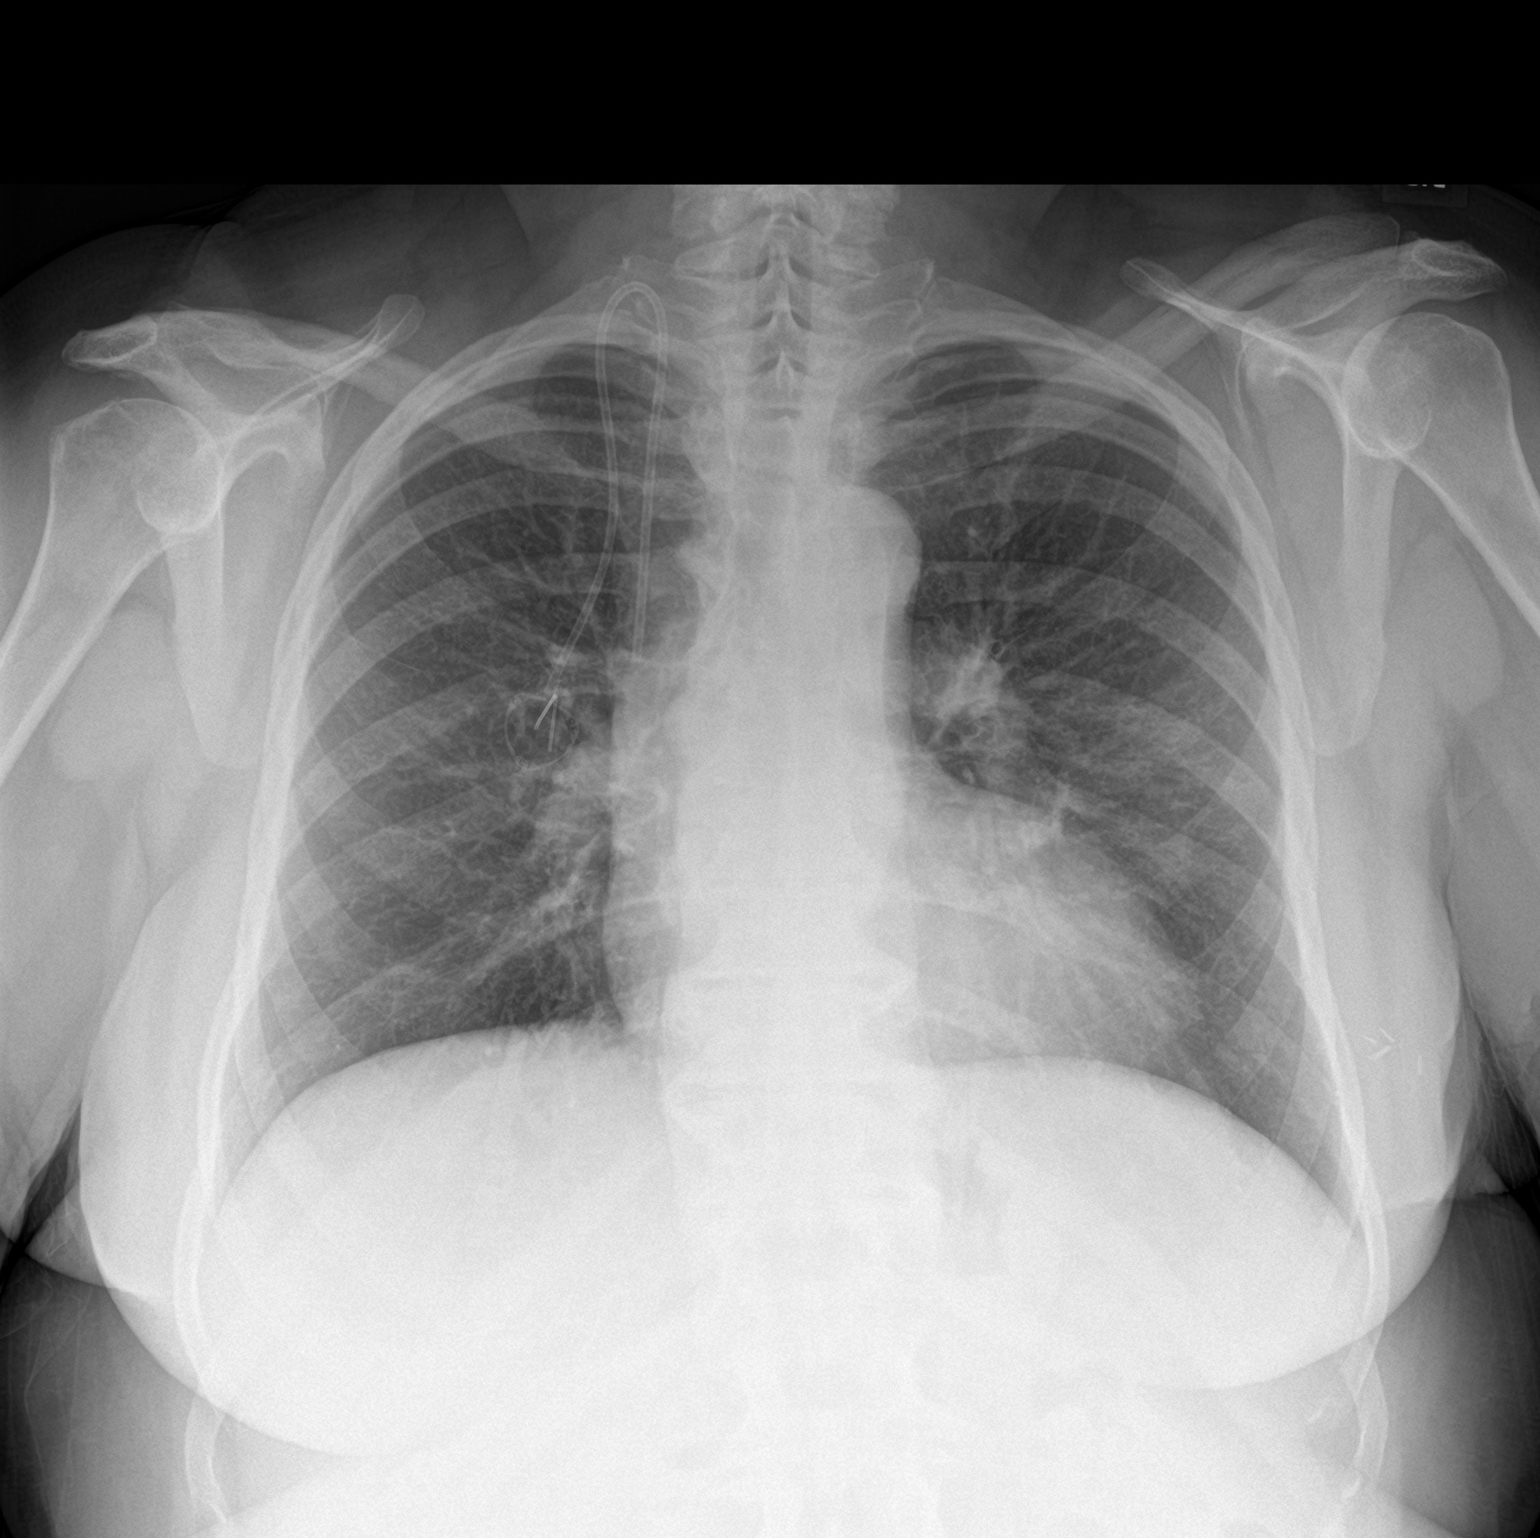

[chest lat]
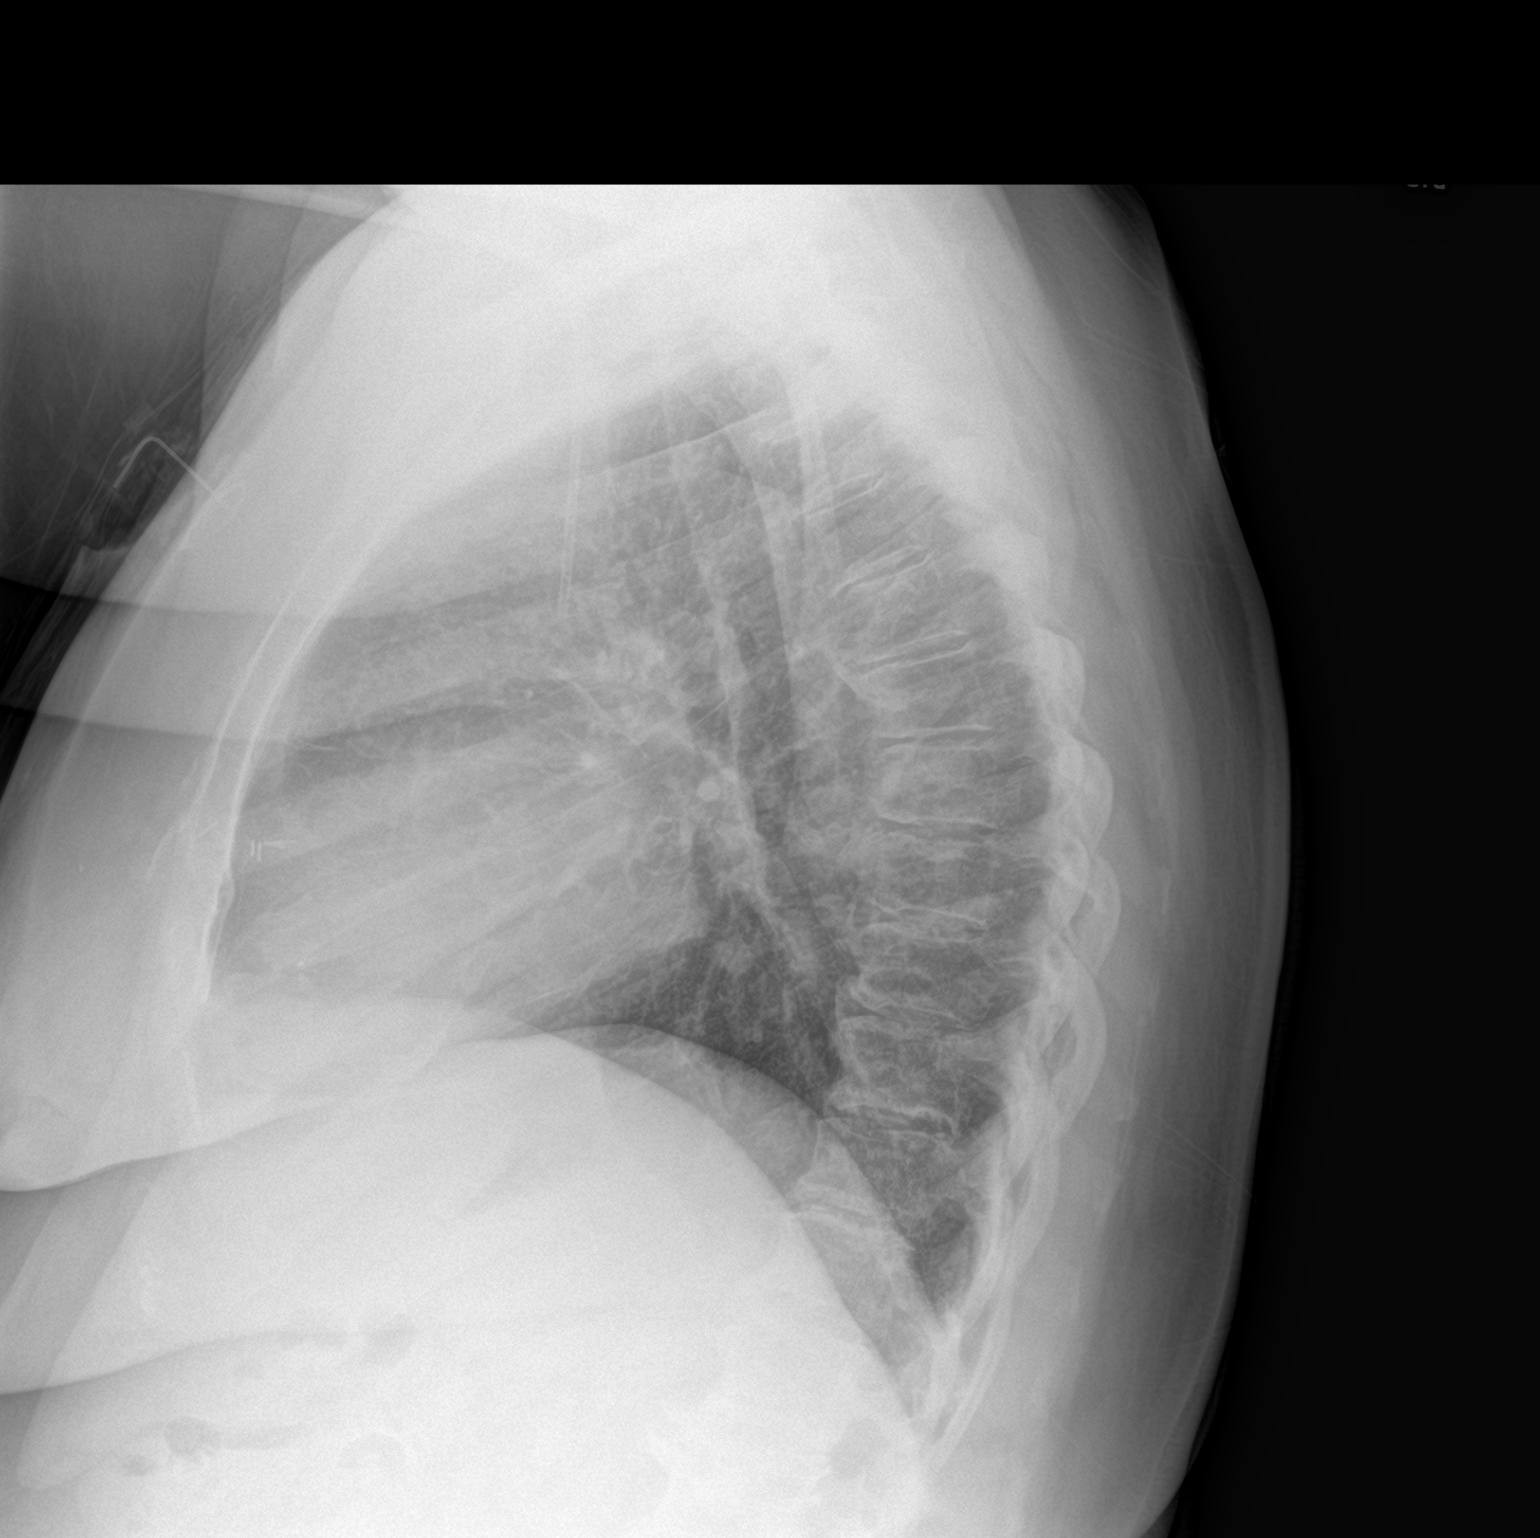

[2 of 2 positions shown; findings below may reference images not displayed]

FINDINGS: Right IJ Port-A-Cath, unchanged in positioning. The heart size and
mediastinal contours are within normal limits. No focal airspace
consolidation, pleural effusion, or pneumothorax. The visualized
skeletal structures are unremarkable.
IMPRESSION: No active cardiopulmonary disease.

## 2022-01-06 IMAGING — CT CT ANGIO CHEST
2 of 6 series · 18 of 36 positions shown · IV contrast (omnipaque)
Comparison: 10/16/2019

CLINICAL DATA: Cough, shortness of breath

EXAM:
CT ANGIOGRAPHY CHEST WITH CONTRAST
TECHNIQUE: Multidetector CT imaging of the chest was performed using the
standard protocol during bolus administration of intravenous
contrast. Multiplanar CT image reconstructions and MIPs were
obtained to evaluate the vascular anatomy.
CONTRAST:  100mL OMNIPAQUE IOHEXOL 350 MG/ML SOLN

[Series 5: thins · axial · 0.72mm/px · z∈[+1201,+1439]mm · 17 of 268 slices shown]
[im 15/268  lung]
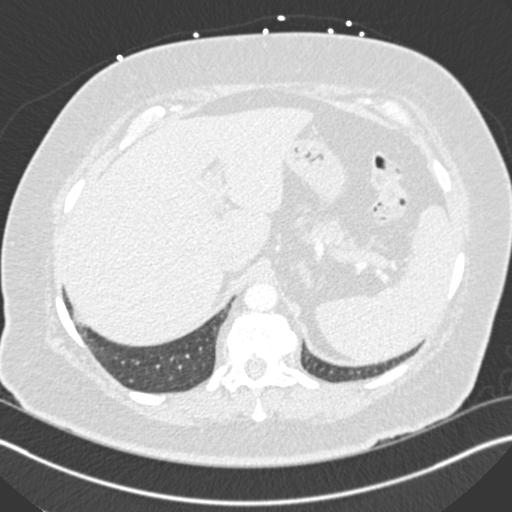
[im 30/268  mediastinal]
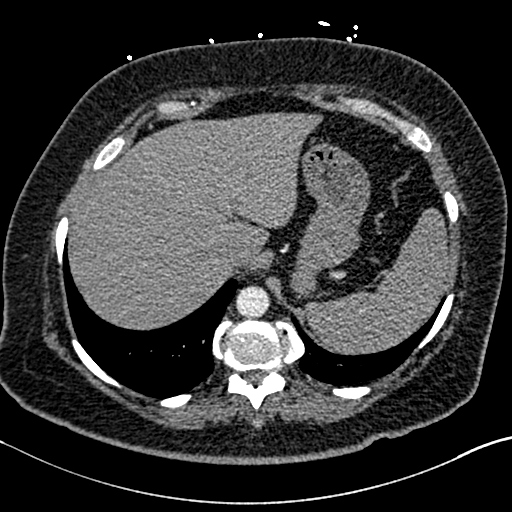
[im 45/268  lung]
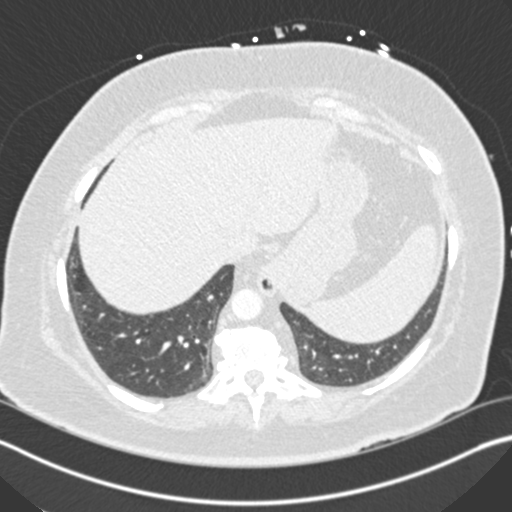
[im 60/268  mediastinal]
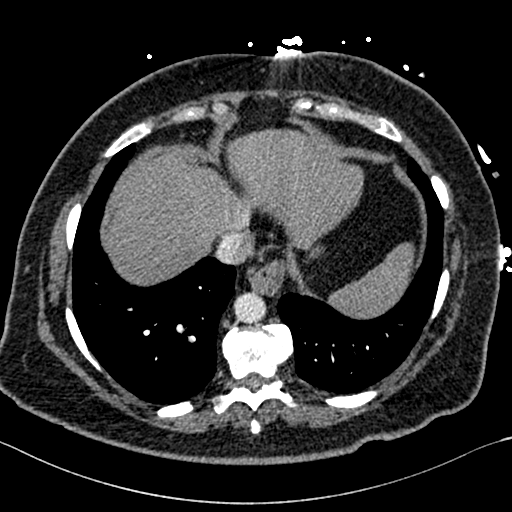
[im 75/268  lung]
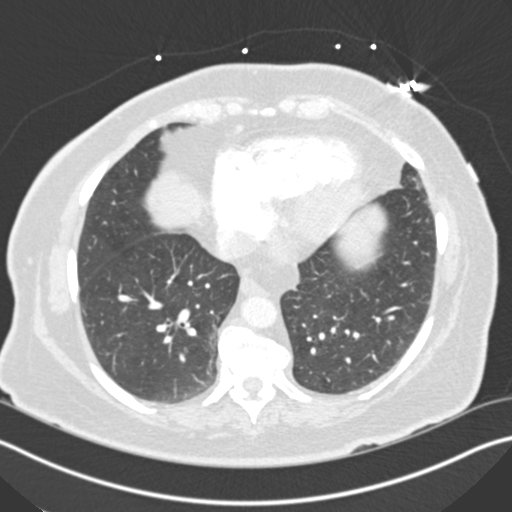
[im 90/268  mediastinal]
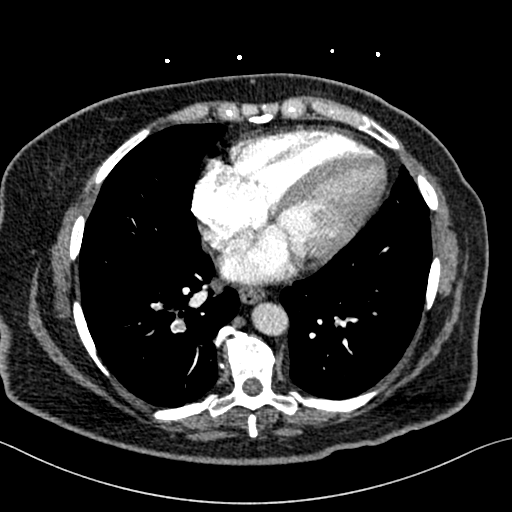
[im 104/268  lung]
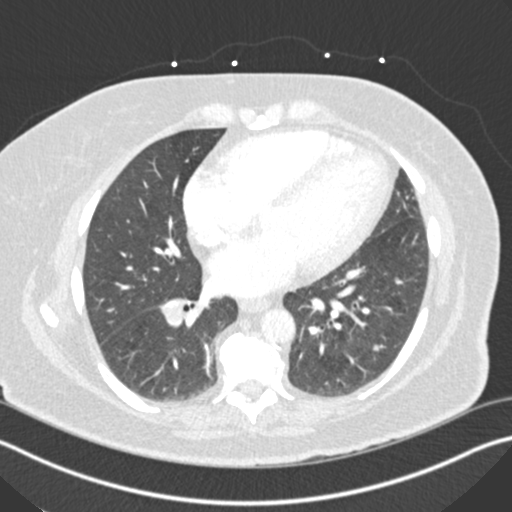
[im 119/268  mediastinal]
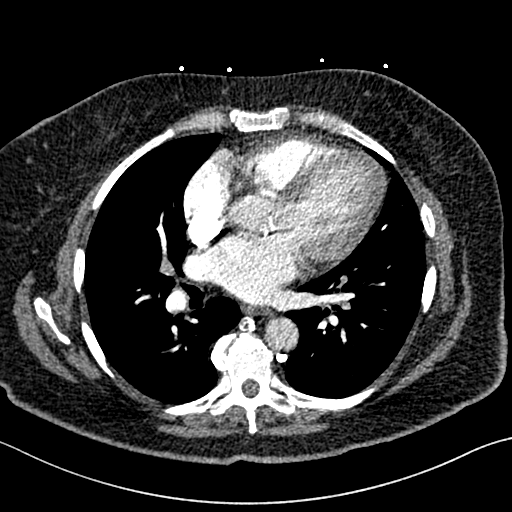
[im 134/268  lung]
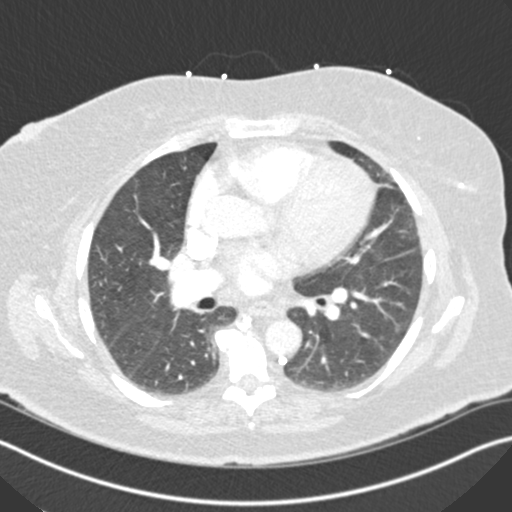
[im 149/268  mediastinal]
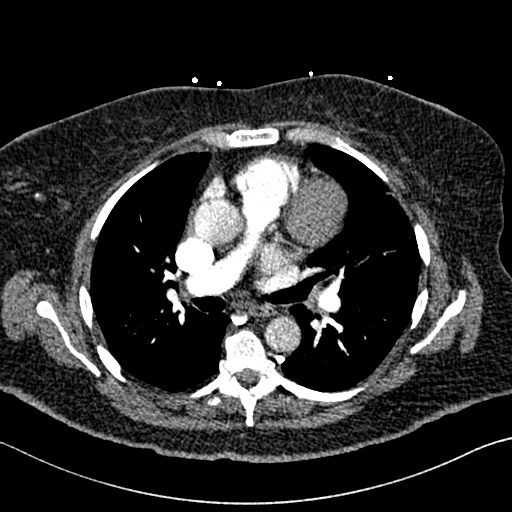
[im 164/268  lung]
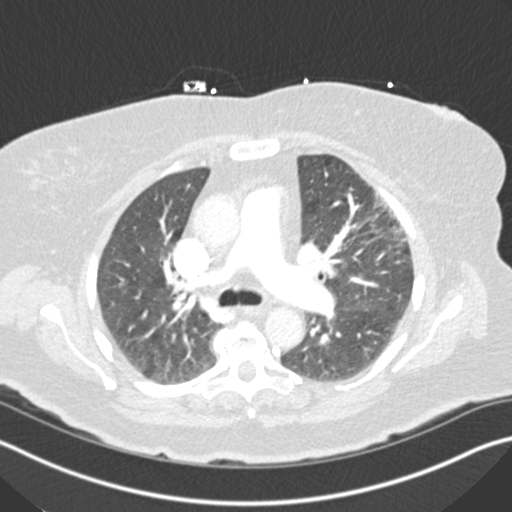
[im 179/268  mediastinal]
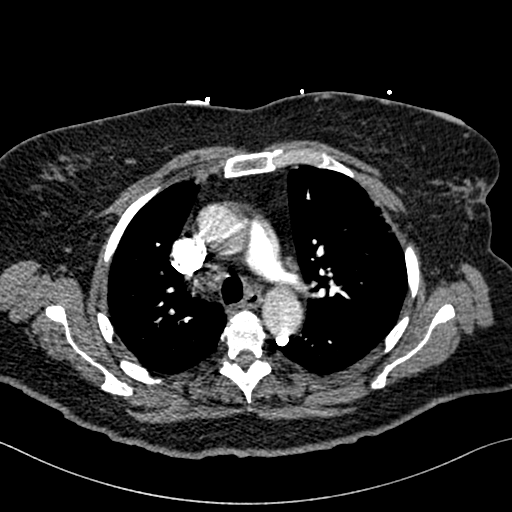
[im 193/268  lung]
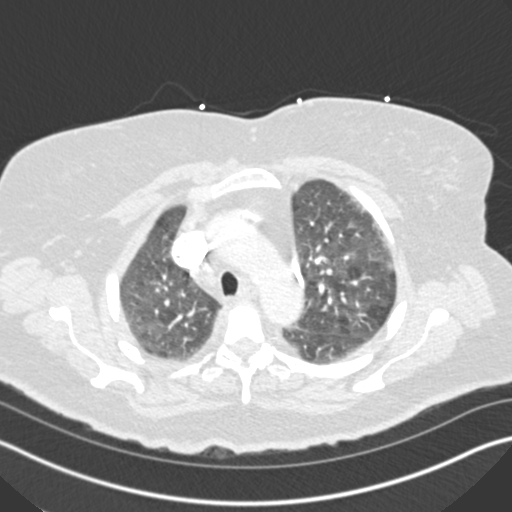
[im 208/268  mediastinal]
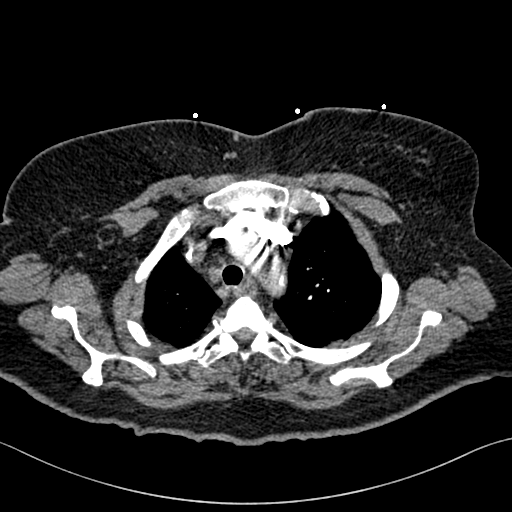
[im 223/268  lung]
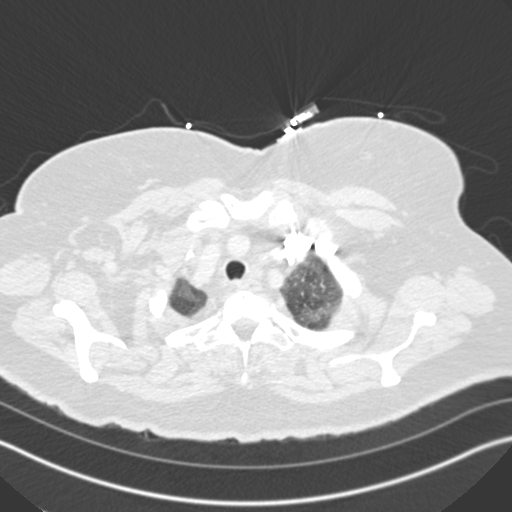
[im 238/268  mediastinal]
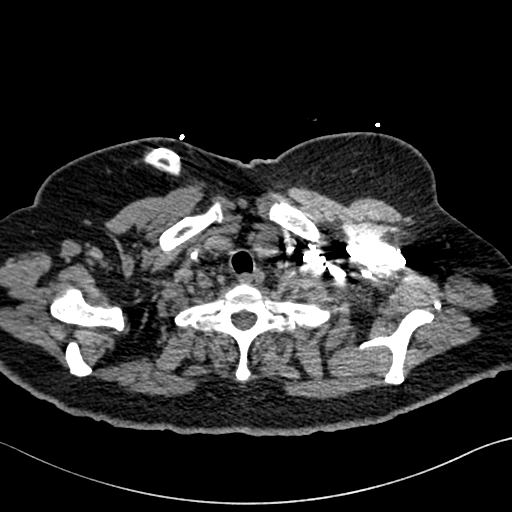
[im 253/268  lung]
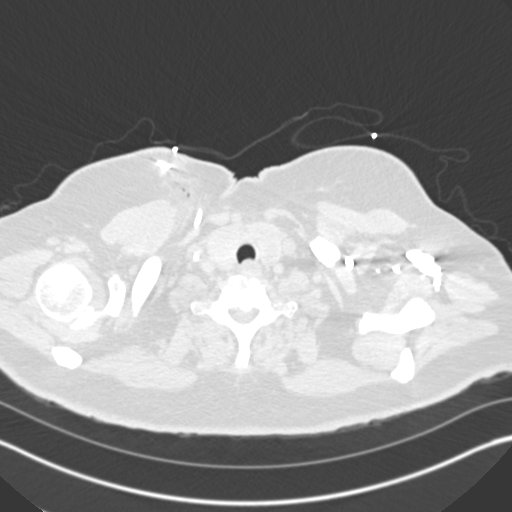

[Series 7: coronal mpr · coronal · 0.63mm/px · 1 of 162 slices shown]
[im 81/162  mediastinal]
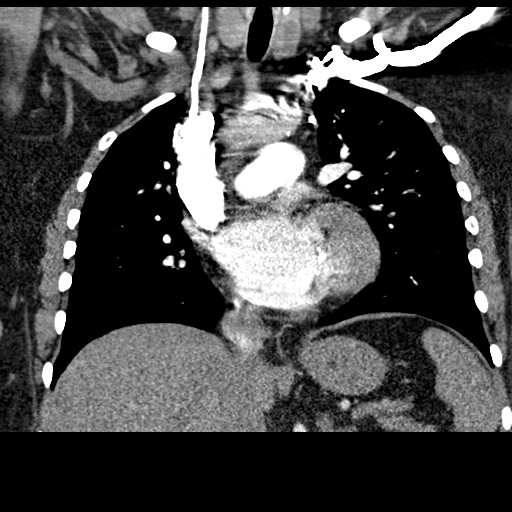

[18 of 36 positions shown; findings below may reference images not displayed]

FINDINGS: Cardiovascular: Filling defects are noted within the pulmonary
arteries in the lower lobes and right middle lobe compatible with
bilateral pulmonary emboli. Slight elevated RV: LV ratio at
suggesting mild right heart strain. Heart is mildly enlarged. Aorta
normal caliber.

Mediastinum/Nodes: No mediastinal, hilar, or axillary adenopathy.
Trachea and esophagus are unremarkable. Left thyroid nodule measures
up to 2.4 cm.

Lungs/Pleura: Scarring noted in the left upper lobe/lingula
peripherally possibly related to prior left breast radiation. No
acute confluent opacities or effusions.

Upper Abdomen: Imaging into the upper abdomen demonstrates no acute
findings.

Musculoskeletal: Chest wall soft tissues are unremarkable. No acute
bony abnormality.

Review of the MIP images confirms the above findings.
IMPRESSION: Positive for acute PE with CTevidence of right heart strain (RV/LV
Ratio = 0.98) consistent with at least submassive (intermediate
risk) PE. The presence of right heart strain has been associated
with an increased risk of morbidity and mortality.

2.4 cm left thyroid nodule. Recommend thyroid US (ref: [HOSPITAL]. [DATE]): 143-50).

These results were called by telephone at the time of interpretation
on 01/27/2020 at [DATE] to provider KAKI JIM , who verbally
acknowledged these results.

## 2022-01-12 DIAGNOSIS — C786 Secondary malignant neoplasm of retroperitoneum and peritoneum: Secondary | ICD-10-CM | POA: Insufficient documentation

## 2022-01-13 ENCOUNTER — Encounter: Payer: Self-pay | Admitting: Hematology

## 2022-01-13 ENCOUNTER — Inpatient Hospital Stay: Payer: Medicare Other | Attending: Hematology | Admitting: Hematology

## 2022-01-13 ENCOUNTER — Other Ambulatory Visit: Payer: Self-pay

## 2022-01-13 ENCOUNTER — Inpatient Hospital Stay: Payer: Medicare Other

## 2022-01-13 VITALS — BP 136/68 | HR 66 | Temp 98.4°F | Resp 18 | Ht 65.0 in | Wt 247.5 lb

## 2022-01-13 DIAGNOSIS — E1142 Type 2 diabetes mellitus with diabetic polyneuropathy: Secondary | ICD-10-CM | POA: Diagnosis not present

## 2022-01-13 DIAGNOSIS — C7963 Secondary malignant neoplasm of bilateral ovaries: Secondary | ICD-10-CM | POA: Insufficient documentation

## 2022-01-13 DIAGNOSIS — C180 Malignant neoplasm of cecum: Secondary | ICD-10-CM | POA: Insufficient documentation

## 2022-01-13 DIAGNOSIS — Z800 Family history of malignant neoplasm of digestive organs: Secondary | ICD-10-CM | POA: Diagnosis not present

## 2022-01-13 DIAGNOSIS — C182 Malignant neoplasm of ascending colon: Secondary | ICD-10-CM | POA: Diagnosis not present

## 2022-01-13 DIAGNOSIS — Z923 Personal history of irradiation: Secondary | ICD-10-CM | POA: Diagnosis not present

## 2022-01-13 DIAGNOSIS — C786 Secondary malignant neoplasm of retroperitoneum and peritoneum: Secondary | ICD-10-CM | POA: Diagnosis not present

## 2022-01-13 DIAGNOSIS — Z86000 Personal history of in-situ neoplasm of breast: Secondary | ICD-10-CM | POA: Diagnosis not present

## 2022-01-13 DIAGNOSIS — I10 Essential (primary) hypertension: Secondary | ICD-10-CM | POA: Insufficient documentation

## 2022-01-13 DIAGNOSIS — E1136 Type 2 diabetes mellitus with diabetic cataract: Secondary | ICD-10-CM | POA: Diagnosis not present

## 2022-01-13 DIAGNOSIS — C779 Secondary and unspecified malignant neoplasm of lymph node, unspecified: Secondary | ICD-10-CM | POA: Diagnosis not present

## 2022-01-13 DIAGNOSIS — Z90710 Acquired absence of both cervix and uterus: Secondary | ICD-10-CM | POA: Insufficient documentation

## 2022-01-13 DIAGNOSIS — Z803 Family history of malignant neoplasm of breast: Secondary | ICD-10-CM | POA: Diagnosis not present

## 2022-01-13 DIAGNOSIS — Z95828 Presence of other vascular implants and grafts: Secondary | ICD-10-CM

## 2022-01-13 LAB — COMPREHENSIVE METABOLIC PANEL
ALT: 15 U/L (ref 0–44)
AST: 21 U/L (ref 15–41)
Albumin: 4.1 g/dL (ref 3.5–5.0)
Alkaline Phosphatase: 67 U/L (ref 38–126)
Anion gap: 8 (ref 5–15)
BUN: 14 mg/dL (ref 8–23)
CO2: 26 mmol/L (ref 22–32)
Calcium: 9.1 mg/dL (ref 8.9–10.3)
Chloride: 108 mmol/L (ref 98–111)
Creatinine, Ser: 0.7 mg/dL (ref 0.44–1.00)
GFR, Estimated: >60 mL/min (ref >60)
Glucose, Bld: 136 mg/dL — ABNORMAL HIGH (ref 70–99)
Potassium: 3.8 mmol/L (ref 3.5–5.1)
Sodium: 142 mmol/L (ref 135–145)
Total Bilirubin: 1 mg/dL (ref 0.3–1.2)
Total Protein: 6.5 g/dL (ref 6.5–8.1)

## 2022-01-13 LAB — CBC WITH DIFFERENTIAL/PLATELET
Abs Immature Granulocytes: 0.01 10*3/uL (ref 0.00–0.07)
Basophils Absolute: 0.1 10*3/uL (ref 0.0–0.1)
Basophils Relative: 1 %
Eosinophils Absolute: 0.2 10*3/uL (ref 0.0–0.5)
Eosinophils Relative: 4 %
HCT: 37.5 % (ref 36.0–46.0)
Hemoglobin: 12.4 g/dL (ref 12.0–15.0)
Immature Granulocytes: 0 %
Lymphocytes Relative: 41 %
Lymphs Abs: 2.7 10*3/uL (ref 0.7–4.0)
MCH: 32.6 pg (ref 26.0–34.0)
MCHC: 33.1 g/dL (ref 30.0–36.0)
MCV: 98.7 fL (ref 80.0–100.0)
Monocytes Absolute: 0.6 10*3/uL (ref 0.1–1.0)
Monocytes Relative: 9 %
Neutro Abs: 3 10*3/uL (ref 1.7–7.7)
Neutrophils Relative %: 45 %
Platelets: 285 10*3/uL (ref 150–400)
RBC: 3.8 MIL/uL — ABNORMAL LOW (ref 3.87–5.11)
RDW: 12.9 % (ref 11.5–15.5)
WBC: 6.6 10*3/uL (ref 4.0–10.5)
nRBC: 0 % (ref 0.0–0.2)

## 2022-01-13 LAB — CEA (IN HOUSE-CHCC): CEA (CHCC-In House): 22.67 ng/mL — ABNORMAL HIGH (ref 0.00–5.00)

## 2022-01-13 MED ORDER — SODIUM CHLORIDE 0.9% FLUSH
10.0000 mL | INTRAVENOUS | Status: DC | PRN
  Administered 2022-01-13: 10 mL

## 2022-01-13 MED ORDER — PROCHLORPERAZINE MALEATE 10 MG PO TABS: 10.0000 mg | ORAL_TABLET | Freq: Four times a day (QID) | ORAL | 1 refills | Status: DC | PRN

## 2022-01-13 MED ORDER — HEPARIN SOD (PORK) LOCK FLUSH 100 UNIT/ML IV SOLN
500.0000 [IU] | Freq: Once | INTRAVENOUS | Status: AC | PRN
  Administered 2022-01-13: 500 [IU]

## 2022-01-13 MED ORDER — LIDOCAINE-PRILOCAINE 2.5-2.5 % EX CREA: TOPICAL_CREAM | CUTANEOUS | 3 refills | Status: DC

## 2022-01-13 MED ORDER — ONDANSETRON HCL 8 MG PO TABS: 8.0000 mg | ORAL_TABLET | Freq: Three times a day (TID) | ORAL | 1 refills | Status: DC | PRN

## 2022-01-13 NOTE — Progress Notes (Signed)
Kim Castro   [phone]) [phone] Fax:(336) [phone]   Clinic Follow up Note   Patient Care Team: [name], MD as PCP - General (Family Medicine) Rolm Bookbinder, MD as Consulting Physician (General Surgery) Truitt Merle, MD as Consulting Physician (Hematology) Leighton Ruff, MD as Consulting Physician (General Surgery) Nicholas Lose, MD as Consulting Physician (Hematology and Oncology)  Date of Service:  01/13/2022  CHIEF COMPLAINT: f/u of metastatic colon cancer, h/o DCIS  CURRENT THERAPY:  To restart FOLFOX 01/24/22  ASSESSMENT & PLAN:  Kim Castro is a 76 y.o. female with   1. Right colon cancer, pT3N2aM0 stage IIB, MSS, metastatic to b/l ovaries -She was diagnosed in 08/2019 with cecal mass biopsy showed moderately differentiated adenocarcinoma. CT chest from 10/16/19 did shows multiple small lung nodules that were nonspecific but likely benign. Will monitor.  -She underwent colon surgery with Dr Marcello Moores on 11/01/19. Path showed overall Stage IIIB cancer.  -She received 6 months of FOLFOX to reduce her risk of recurrence, completed 05/2020.  -surveillance colonoscopy on 02/10/21 under Dr. Carlean Purl showed only diverticulosis. Repeat due in 01/2024. -restaging CT CAP 08/26/21 was NED. CEA that day rose to 6.99 and continued to rise to 16.23 by 11/29/21. -restaging PET scan on 11/10/21 showed hypermetabolism to enlarged left ovary, no other evidence of metastatic disease. -she underwent total hysterectomy on 12/22/21 with Dr. Berline Lopes. Path revealed metastatic moderately differentiated colonic adenocarcinoma involving both ovaries. Peritoneal deposits were also positive for metastatic mucinous adenocarcinoma, consistent with colorectal primary. --I reviewed the pathology results with her.  Although Dr. Berline Lopes has the above most of her peritoneal metastasis, I do not think her cancer is cured with the surgery, she at least has microscopic metastasis.  I recommend  chemotherapy with restarting FOLFOX. She previously tolerated this well. She has mild neuropathy, I will slightly reduce oxaliplatin dose. -I also discussed the option of HIPEC surgery, I may refer her to dietitian at the [facility].  She is open to that. -due to possible HIPEC surgery, I do not plan to give her bevacizumab. -I will request foundation 1 molecular testing on her recent surgical sample, to see if she is a candidate for targeted therapy.   2. Neuropathy, secondary to Oxaliplatin G1 -S/p C7 she started having numbness in hands, L>R which become prolonged with tingling s/p C10.  -Oxaliplatin dose reduced and ultimately held with C11-12.  -she is on Lyrica and B vit complex, mild and stable   3. H/o left breast DCIS, G2, ER/PR+ -Dx in 05/2014, s/p left lumpectomy with Dr Excell Seltzer, adjuvant RT with Dr Pablo Ledger, and Anastrozole 08/2014 - 01/13/20 under Dr Lindi Adie.  -DEXA 09/11/20 T score +1.2 normal.  -most recent mammogram on 10/04/21 was negative. -We again discussed a breast MRI for breast cancer screening; she is agreeable. We will plan for this in 04/2022.   4. Submassive bilateral pulmonary embolism, LE Edema  -S/p C3 chemo, her 01/27/20 CTA showed PE with right heart strain. She also has bilateral lower extremity edema and Doppler ultrasound was negative for DVT bilaterally.  -She was treated with 6 months of Eliquis, Lasix and oral potassium BID for LE Edema.   5. Comorbidities: Arthritis, DM, HTN, GERD  -she switched to Dr. Gena Fray for primary care in 05/2021.     PLAN:  -plan to restart FOLFOX on 9/25 and continue every 2 weeks. I will see her before her chemo   -she prefers Monday appointments   No problem-specific Assessment & Plan  notes found for this encounter.   SUMMARY OF ONCOLOGIC HISTORY: Oncology History Overview Note   Cancer Staging  Malignant neoplasm of female breast [facility]) Staging form: Breast, AJCC 7th Edition - Clinical stage from 06/11/2014: Stage 0 (Tis  (DCIS), N0, M0) - Unsigned Staged by: Pathologist and managing physician Laterality: Left Estrogen receptor status: Positive Progesterone receptor status: Positive Stage used in treatment planning: Yes National guidelines used in treatment planning: Yes Type of national guideline used in treatment planning: NCCN - Pathologic stage from 07/03/2014: Stage Unknown (Tis (DCIS), NX, cM0) - Signed by Enid Cutter, MD on 07/10/2014 Staged by: Pathologist Laterality: Left Estrogen receptor status: Positive Progesterone receptor status: Positive Stage used in treatment planning: Yes National guidelines used in treatment planning: Yes Type of national guideline used in treatment planning: NCCN Staging comments: Staged on final lumpectomy specimen by Dr. Donato Heinz.  Right colon cancer Staging form: Colon and Rectum, AJCC 8th Edition - Pathologic stage from 11/01/2019: Stage IIIB (pT3, pN2a, cM0) - Signed by Truitt Merle, MD on 11/29/2019 Stage prefix: Initial diagnosis Histologic grading system: 4 grade system Histologic grade (G): G2 Residual tumor (R): R0 - None Tumor deposits (TD): Absent Perineural invasion (PNI): Absent Microsatellite instability (MSI): Stable KRAS mutation: Unknown NRAS mutation: Unknown BRAF mutation: Unknown     Malignant neoplasm of female breast ([place])  05/29/2014 Initial Biopsy   Left breast needle core biopsy: Grade 2, DCIS with calcs. ER+ (100%), PR+ (96%).    06/04/2014 Initial Diagnosis   Left breast DCIS with calcifications, ER 100%, PR 96%   06/10/2014 Breast MRI   Left breast: 2.4 x 1.3 x 1.1 cm area of patchy non-mass enhancement upper outer quadrant includes postbiopsy seroma; Right breast: 1.2 cm previously biopsied stable benign fibroadenoma   06/12/2014 Procedure   Genetic counseling/testing: Identified 1 VUS on CHEK2 gene. Remainder of 17 gene panel tested negative and included: ATM, BARD1, BRCA 1/2, BRIP1, CDH1, CHEK2, EPCAM, MLH1, MSH2, MSH6, NBN, NF1, PALB2,  PTEN, RAD50, RAD51C, RAD51D, STK11, and TP53.    07/01/2014 Surgery   Left breast lumpectomy ([place]): Grade 1, DCIS, spanning 2.3 cm, 1 mm margin, ER 100%, PR 96%   07/31/2014 - 08/28/2014 Radiation Therapy   Adjuvant RT completed Pablo Ledger). Left breast: Total dose 42.5 Gy over 17 fractions. Left breast boost: Total dose 7.5 Gy over 3 fractions.    09/14/2014 - 01/13/2020 Anti-estrogen oral therapy   Anastrazole $RemoveBefo'1mg'dbsdQAASyAc$  daily. Planned duration of treatment: 5 years [place]). Completed in 01/2020.    09/25/2014 Survivorship   Survivorship Care Plan given to patient and reviewed with her in person.    03/02/2021 Imaging   CT CAP  IMPRESSION: 1. No findings of active/recurrent malignancy. Partial right hemicolectomy. 2. Endometrial stripe remains mildly thickened, but endometrial biopsy in May was negative for malignancy. 3. Progressive endplate sclerosis and endplate irregularity at T2-3, probably due to degenerative endplate findings. If the has referable upper thoracic pain/symptoms then thoracic spine MRI could be used for further workup. 4. Other imaging findings of potential clinical significance: Mild cardiomegaly. Aortic Atherosclerosis (ICD10-I70.0). Mild mitral valve calcification. Postoperative findings in the left breast with adjacent radiation port anteriorly in the left upper lobe. Tiny pulmonary nodules in the left lower lobe are unchanged from earliest available comparison of 10/17/2019 and probably benign although may merit surveillance. Multilevel lumbar impingement. Mild pelvic floor laxity.   Right colon cancer  09/19/2019 Imaging   CT AP W contrast 09/19/19  IMPRESSION Fullness in the cecum, cannot exclude a mass.  No evidence for metastatic disease is identified.    09/24/2019 Procedure   Colonoscopy by Dr Eber Jones 09/24/19 IMPRESSION 1. The colon was redundant  2. Mild diverticulosis was noted through the entire examined colon 3. Single 44mm polyp was found in the  ascending colon; polypectomy was performed using snare cautery and biopsy forceps 4. Mild diverticulosis was notes in the descending colon and sigmoid colon.  5. Single polyp was found in the sigmoid colon, polypectomy was performed with cold forceps.  6. Single polyp was found in the rectosigmoid colon; polypectomy was performed with cold snare  7. Small internal hemorrhoids  8. Large mass was found at the cecum; multiple biopsies of the area were performed using cold forceps; injection (tattooing) was performed distal to the mass.    09/24/2019 Initial Biopsy   INTERPRETATION AND DIAGNOSIS:  A. Cecum, biopsy:  Invasive moderately differentiated adenocarcinoma.  see comment  B. Polyp @ ascending colon, polypectomy:  Tubular Adenoma  C. Polyp @ sigmoid colon Polypectomy:  hyperplastic polyp.  D. Polyp @ rectosigmoid colon, Polypectomy:  Hyperplastic Polyp      10/16/2019 Imaging   CT Chest IMPRESSION: 1. Multiple small pulmonary nodules measuring 5 mm or less in size in the lungs. These are nonspecific and are typically considered statistically likely benign. However, given the patient's history of primary malignancy, close attention on follow-up studies is recommended to ensure stability. 2. Aortic atherosclerosis, in addition to right coronary artery disease. Assessment for potential risk factor modification, dietary therapy or pharmacologic therapy may be warranted, if clinically indicated. 3. There are calcifications of the aortic valve and mitral annulus. Echocardiographic correlation for evaluation of potential valvular dysfunction may be warranted if clinically indicated. 4. Small hiatal hernia.   Aortic Atherosclerosis (ICD10-I70.0).   11/01/2019 Initial Diagnosis   Colon cancer ([place])   11/01/2019 Surgery   LAPAROSCOPIC PARTIAL COLECTOMY by Dr Marcello Moores and Dr Johney Maine   11/01/2019 Pathology Results   FINAL MICROSCOPIC DIAGNOSIS:   A. COLON, PROXIMAL RIGHT, COLECTOMY:  -  Invasive colonic adenocarcinoma, 5 cm.  - Tumor invades through the muscularis propria into pericolonic tissues.   - Margins of resection are not involved.  - Metastatic carcinoma in (5) of (13) lymph nodes.  - See oncology table.    MSI Stable  Mismatch repair normal  MLH1 - Preserved nuclear expression (greater 50% tumor expression) MSH2 - Preserved nuclear expression (greater 50% tumor expression) MSH6 - Preserved nuclear expression (greater 50% tumor expression) PMS2 - Preserved nuclear expression (greater 50% tumor expression)   11/01/2019 Cancer Staging   Staging form: Colon and Rectum, AJCC 8th Edition - Pathologic stage from 11/01/2019: Stage IIIB (pT3, pN2a, cM0) - Signed by Truitt Merle, MD on 11/29/2019   12/10/2019 Procedure   PAC placed 12/10/19   12/17/2019 - 06/01/2020 Chemotherapy   FOLFOX q2weeks starting in 2 weeks starting 12/17/19. Held 01/27/20-02/10/20 due to b/l PE. Oxaliplatin held C11-12 due to neuropathy. Completed on 06/01/20   03/02/2021 Imaging   CT CAP  IMPRESSION: 1. No findings of active/recurrent malignancy. Partial right hemicolectomy. 2. Endometrial stripe remains mildly thickened, but endometrial biopsy in May was negative for malignancy. 3. Progressive endplate sclerosis and endplate irregularity at T2-3, probably due to degenerative endplate findings. If the has referable upper thoracic pain/symptoms then thoracic spine MRI could be used for further workup. 4. Other imaging findings of potential clinical significance: Mild cardiomegaly. Aortic Atherosclerosis (ICD10-I70.0). Mild mitral valve calcification. Postoperative findings in the left breast with adjacent radiation port anteriorly in  the left upper lobe. Tiny pulmonary nodules in the left lower lobe are unchanged from earliest available comparison of 10/17/2019 and probably benign although may merit surveillance. Multilevel lumbar impingement. Mild pelvic floor laxity.   08/26/2021 Imaging    EXAM: CT CHEST, ABDOMEN, AND PELVIS WITH CONTRAST  IMPRESSION: 1. Stable examination without new or progressive findings to suggest local recurrence or metastatic disease within the chest, abdomen, or pelvis. 2. Hepatomegaly with hepatic steatosis. 3. Sigmoid colonic diverticulosis without findings of acute diverticulitis. 4. Similar prominent endplate sclerosis and irregularity at T2-T3 is most consistent with Modic type endplate degenerative changes. However, if patient has referable upper thoracic pain consider further workup with thoracic spine MRI. 5. Similar thickening of the endometrial stripe measuring up to 8 mm, which was previously biopsied with results negative for malignancy. 6. Aortic Atherosclerosis (ICD10-I70.0).   11/10/2021 PET scan   IMPRESSION: 1. LEFT ovary is increased in size and is intensely hypermetabolic. While physiologic hypermetabolic ovarian tissue is not uncommon, the enlargement and asymmetric activity warrants further evaluation. Consider contrast pelvic MRI vs tissue sampling. 2. No evidence of metastatic colorectal carcinoma otherwise. 3. Post RIGHT hemicolectomy anatomy. 4. Evidence of radiation change in the LEFT upper lobe (remote breast cancer).     12/22/2021 Relapse/Recurrence    FINAL MICROSCOPIC DIAGNOSIS:   A. LEFT OVARY AND FALLOPIAN TUBE, SALPINGO OOPHORECTOMY:  - Metastatic moderately differentiated colonic adenocarcinoma involving left ovary  - Focal ovarian stromal calcification  - Segment of benign fallopian tube   B. UTERUS WITH RIGHT FALLOPIAN TUBE AND OVARY, HYSTERECTOMY AND SALPINGO-OOPHORECTOMY:  - Metastatic moderately differentiated colonic adenocarcinoma involving right ovary  - Focal invasive extensive adenomyosis  - Benign endometrial polyps  - Benign proliferative phase endometrium  - Hydrosalpinx of right fallopian tube  - Focal ovarian stromal calcification   C. PERITONEAL DEPOSITS, ANTERIOR CUL DE SAC, BIOPSY:  -  Metastatic mucinous adenocarcinoma, consistent with colorectal primary    COMMENT:  Immunohistochemical stains show that the tumor cells are positive for CK20 and CDX2 while they are negative for CK7 and PAX8, consistent with above interpretation.    01/24/2022 -  Chemotherapy   Patient is on Treatment Plan : COLORECTAL FOLFOX q14d x 3 months        INTERVAL HISTORY:  Kim Castro is here for a follow up of now metastatic colon cancer. She was last seen by me on 11/29/21. She presents to the clinic alone. She reports she did very well with surgery and has recovered better than she anticipated.   All other systems were reviewed with the patient and are negative.  MEDICAL HISTORY:  Past Medical History:  Diagnosis Date   Aortic atherosclerosis (HCC)    Arthritis    feet, lower back   Basal cell carcinoma    arm   Breast cancer of upper-outer quadrant of left female breast ([place]) 06/04/2014   Cataract    immature on the left   Colon cancer ([place]) 08/2019   Diabetes mellitus without complication (HCC)    Diverticulosis    Dizziness    > 4yrs ago;took Antivert    Family history of anesthesia complication    sister slow to wake up with anesthesia   Family history of breast cancer    Family history of colon cancer    Family history of uterine cancer    GERD (gastroesophageal reflux disease)    takes occasional TUMs   History of bronchitis    > 110yrs ago   History  of colon polyps    History of hiatal hernia    Small noted on CT   History of pulmonary embolus (PE)    Hypertension    takes Losartan daily and HCTZ   Iron deficiency anemia    Joint pain    Numbness    to toes on each foot   Peripheral neuropathy    feet and toes   Personal history of radiation therapy    Pulmonary nodules    Noted on CT   Radiation 07/31/14-08/28/14   Left Breast 20 fxs   Seasonal allergies    takes Claritin prn   Urinary frequency    Vitamin D deficiency    takes VIt D daily     SURGICAL HISTORY: Past Surgical History:  Procedure Laterality Date   BREAST BIOPSY Bilateral    BREAST LUMPECTOMY Left    BREAST LUMPECTOMY WITH RADIOACTIVE SEED LOCALIZATION Left 07/01/2014   Procedure: LEFT BREAST LUMPECTOMY WITH RADIOACTIVE SEED LOCALIZATION;  Surgeon: Excell Seltzer, MD;  Location: [place];  Service: General;  Laterality: Left;   CATARACT EXTRACTION Right    COLONOSCOPY  09/24/2019   [place]   LAPAROSCOPIC PARTIAL COLECTOMY N/A 11/01/2019   Procedure: LAPAROSCOPIC PARTIAL COLECTOMY;  Surgeon: Leighton Ruff, MD;  Location: [facility];  Service: General;  Laterality: N/A;   POLYPECTOMY     PORTACATH PLACEMENT N/A 12/10/2019   Procedure: INSERTION PORT-A-CATH ULTRASOUND GUIDED IN RIGHT IJ;  Surgeon: Leighton Ruff, MD;  Location: [facility];  Service: General;  Laterality: N/A;   ROBOTIC ASSISTED TOTAL HYSTERECTOMY Bilateral 12/22/2021   Procedure: XI ROBOTIC ASSISTED TOTAL HYSTERECTOMY WITH BILATERAL SALPINGO OOPHORECTOMY;  Surgeon: [name], MD;  Location: [facility];  Service: Gynecology;  Laterality: Bilateral;   TOTAL KNEE ARTHROPLASTY Left 10/24/2012   Procedure: TOTAL KNEE ARTHROPLASTY;  Surgeon: Kerin Salen, MD;  Location: [place];  Service: Orthopedics;  Laterality: Left;   TOTAL KNEE ARTHROPLASTY Right 01/09/2013   Procedure: TOTAL KNEE ARTHROPLASTY;  Surgeon: Kerin Salen, MD;  Location: [place];  Service: Orthopedics;  Laterality: Right;   TUBAL LIGATION      I have reviewed the social history and family history with the patient and they are unchanged from previous note.  ALLERGIES:  is allergic to oxycodone.  MEDICATIONS:  Current Outpatient Medications  Medication Sig Dispense Refill   acetaminophen (TYLENOL) 325 MG tablet Take 650 mg by mouth every 6 (six) hours as needed for moderate pain.     b complex vitamins capsule Take 1 capsule by mouth daily.     Cholecalciferol (VITAMIN D) 2000 UNITS CAPS Take 2,000 Units by mouth  daily.      lidocaine-prilocaine (EMLA) cream Apply to affected area once 30 g 3   loratadine (CLARITIN) 10 MG tablet Take 10 mg by mouth daily as needed for allergies.     losartan (COZAAR) 25 MG tablet Take 1 tablet (25 mg total) by mouth daily. 90 tablet 3   metFORMIN (GLUCOPHAGE) 500 MG tablet Take 1 tablet (500 mg total) by mouth in the morning and at bedtime. 180 tablet 3   Multiple Vitamins-Minerals (MULTIVITAMIN WITH MINERALS) tablet Take 1 tablet by mouth daily.     omeprazole (PRILOSEC) 20 MG capsule Take 20 mg by mouth daily before breakfast.      ondansetron (ZOFRAN) 8 MG tablet Take 1 tablet (8 mg total) by mouth every 8 (eight) hours as needed for nausea or vomiting. Start on the third day after chemotherapy. [place]  tablet 1   pravastatin (PRAVACHOL) 10 MG tablet Take 1 tablet (10 mg total) by mouth daily. 90 tablet 3   pregabalin (LYRICA) 25 MG capsule Take 1 capsule by mouth once daily 30 capsule 0   prochlorperazine (COMPAZINE) 10 MG tablet Take 1 tablet (10 mg total) by mouth every 6 (six) hours as needed for nausea or vomiting. 30 tablet 1   No current facility-administered medications for this visit.    PHYSICAL EXAMINATION: ECOG PERFORMANCE STATUS: 0 - Asymptomatic  Vitals:   01/13/22 0807  BP: 136/68  Pulse: 66  Resp: 18  Temp: 98.4 F (36.9 C)  SpO2: 97%   Wt Readings from Last 3 Encounters:  01/13/22 247 lb 8 oz (112.3 kg)  12/22/21 247 lb 5.7 oz (112.2 kg)  12/09/21 247 lb 6.4 oz (112.2 kg)     GENERAL:alert, no distress and comfortable SKIN: skin color, texture, turgor are normal, no rashes or significant lesions EYES: normal, Conjunctiva are pink and non-injected, sclera clear  ABDOMEN: incisions healing well Musculoskeletal:no cyanosis of digits and no clubbing  NEURO: alert & oriented x 3 with fluent speech, no focal motor/sensory deficits  LABORATORY DATA:  I have reviewed the data as listed    Latest Ref Rng & Units 01/13/2022    7:44 AM 12/23/2021     4:17 PM 12/23/2021    4:52 AM  CBC  WBC 4.0 - 10.5 K/uL 6.6   11.5   Hemoglobin 12.0 - 15.0 g/dL 12.4  11.9  12.3   Hematocrit 36.0 - 46.0 % 37.5  38.0  38.0   Platelets 150 - 400 K/uL 285   253         Latest Ref Rng & Units 01/13/2022    7:44 AM 12/23/2021    4:52 AM 12/22/2021   11:52 AM  CMP  Glucose 70 - 99 mg/dL 136  168  118   BUN 8 - 23 mg/dL $Remove'14  11  14   'PXNgHbs$ Creatinine 0.44 - 1.00 mg/dL 0.70  0.70  0.74   Sodium 135 - 145 mmol/L 142  140  141   Potassium 3.5 - 5.1 mmol/L 3.8  4.3  4.3   Chloride 98 - 111 mmol/L 108  110  109   CO2 22 - 32 mmol/L $RemoveB'26  23  24   'jomPViaw$ Calcium 8.9 - 10.3 mg/dL 9.1  8.5  9.1   Total Protein 6.5 - 8.1 g/dL 6.5   6.9   Total Bilirubin 0.3 - 1.2 mg/dL 1.0   1.5   Alkaline Phos 38 - 126 U/L 67   63   AST 15 - 41 U/L 21   26   ALT 0 - 44 U/L 15   18       RADIOGRAPHIC STUDIES: I have personally reviewed the radiological images as listed and agreed with the findings in the report. No results found.    Orders Placed This Encounter  Procedures   CBC with Differential ([place] Only)    Standing Status:   Future    Standing Expiration Date:   01/25/2023   CMP ([place] only)    Standing Status:   Future    Standing Expiration Date:   01/25/2023   CBC with Differential ([place] Only)    Standing Status:   Future    Standing Expiration Date:   02/08/2023   CMP ([place] only)    Standing Status:   Future    Standing Expiration Date:  02/08/2023   All questions were answered. The patient knows to call the clinic with any problems, questions or concerns. No barriers to learning was detected. The total time spent in the appointment was 40 minutes.     Truitt Merle, MD 01/13/2022   I, Wilburn Mylar, am acting as scribe for Truitt Merle, MD.   I have reviewed the above documentation for accuracy and completeness, and I agree with the above.

## 2022-01-14 ENCOUNTER — Inpatient Hospital Stay (HOSPITAL_BASED_OUTPATIENT_CLINIC_OR_DEPARTMENT_OTHER): Payer: Medicare Other | Admitting: Gynecologic Oncology

## 2022-01-14 VITALS — BP 169/78 | HR 90 | Temp 98.3°F | Resp 16 | Ht 65.0 in | Wt 244.0 lb

## 2022-01-14 DIAGNOSIS — C189 Malignant neoplasm of colon, unspecified: Secondary | ICD-10-CM

## 2022-01-14 DIAGNOSIS — N9489 Other specified conditions associated with female genital organs and menstrual cycle: Secondary | ICD-10-CM

## 2022-01-14 DIAGNOSIS — C7960 Secondary malignant neoplasm of unspecified ovary: Secondary | ICD-10-CM

## 2022-01-14 DIAGNOSIS — N840 Polyp of corpus uteri: Secondary | ICD-10-CM

## 2022-01-14 NOTE — Progress Notes (Signed)
Gynecologic Oncology Return Clinic Visit  01/14/22  Reason for Visit: follow-up after surgery  Treatment History: Oncology History Overview Note   Cancer Staging  Malignant neoplasm of female breast [facility]) Staging form: Breast, AJCC 7th Edition - Clinical stage from 06/11/2014: Stage 0 (Tis (DCIS), N0, M0) - Unsigned Staged by: Pathologist and managing physician Laterality: Left Estrogen receptor status: Positive Progesterone receptor status: Positive Stage used in treatment planning: Yes National guidelines used in treatment planning: Yes Type of national guideline used in treatment planning: NCCN - Pathologic stage from 07/03/2014: Stage Unknown (Tis (DCIS), NX, cM0) - Signed by Enid Cutter, MD on 07/10/2014 Staged by: Pathologist Laterality: Left Estrogen receptor status: Positive Progesterone receptor status: Positive Stage used in treatment planning: Yes National guidelines used in treatment planning: Yes Type of national guideline used in treatment planning: NCCN Staging comments: Staged on final lumpectomy specimen by Dr. Donato Heinz.  Right colon cancer Staging form: Colon and Rectum, AJCC 8th Edition - Pathologic stage from 11/01/2019: Stage IIIB (pT3, pN2a, cM0) - Signed by Truitt Merle, MD on 11/29/2019 Stage prefix: Initial diagnosis Histologic grading system: 4 grade system Histologic grade (G): G2 Residual tumor (R): R0 - None Tumor deposits (TD): Absent Perineural invasion (PNI): Absent Microsatellite instability (MSI): Stable KRAS mutation: Unknown NRAS mutation: Unknown BRAF mutation: Unknown     Malignant neoplasm of female breast ([place])  05/29/2014 Initial Biopsy   Left breast needle core biopsy: Grade 2, DCIS with calcs. ER+ (100%), PR+ (96%).    06/04/2014 Initial Diagnosis   Left breast DCIS with calcifications, ER 100%, PR 96%   06/10/2014 Breast MRI   Left breast: 2.4 x 1.3 x 1.1 cm area of patchy non-mass enhancement upper outer quadrant includes postbiopsy  seroma; Right breast: 1.2 cm previously biopsied stable benign fibroadenoma   06/12/2014 Procedure   Genetic counseling/testing: Identified 1 VUS on CHEK2 gene. Remainder of 17 gene panel tested negative and included: ATM, BARD1, BRCA 1/2, BRIP1, CDH1, CHEK2, EPCAM, MLH1, MSH2, MSH6, NBN, NF1, PALB2, PTEN, RAD50, RAD51C, RAD51D, STK11, and TP53.    07/01/2014 Surgery   Left breast lumpectomy ([facility]): Grade 1, DCIS, spanning 2.3 cm, 1 mm margin, ER 100%, PR 96%   07/31/2014 - 08/28/2014 Radiation Therapy   Adjuvant RT completed [facility]). Left breast: Total dose 42.5 Gy over 17 fractions. Left breast boost: Total dose 7.5 Gy over 3 fractions.    09/14/2014 - 01/13/2020 Anti-estrogen oral therapy   Anastrazole $RemoveBefo'1mg'xzgieBPcTQL$  daily. Planned duration of treatment: 5 years [place]). Completed in 01/2020.    09/25/2014 Survivorship   Survivorship Care Plan given to patient and reviewed with her in person.    03/02/2021 Imaging   CT CAP  IMPRESSION: 1. No findings of active/recurrent malignancy. Partial right hemicolectomy. 2. Endometrial stripe remains mildly thickened, but endometrial biopsy in May was negative for malignancy. 3. Progressive endplate sclerosis and endplate irregularity at T2-3, probably due to degenerative endplate findings. If the has referable upper thoracic pain/symptoms then thoracic spine MRI could be used for further workup. 4. Other imaging findings of potential clinical significance: Mild cardiomegaly. Aortic Atherosclerosis (ICD10-I70.0). Mild mitral valve calcification. Postoperative findings in the left breast with adjacent radiation port anteriorly in the left upper lobe. Tiny pulmonary nodules in the left lower lobe are unchanged from earliest available comparison of 10/17/2019 and probably benign although may merit surveillance. Multilevel lumbar impingement. Mild pelvic floor laxity.   Right colon cancer  09/19/2019 Imaging   CT AP W contrast 09/19/19   IMPRESSION Fullness in  the cecum, cannot exclude a mass. No evidence for metastatic disease is identified.    09/24/2019 Procedure   Colonoscopy by Dr Eber Jones 09/24/19 IMPRESSION 1. The colon was redundant  2. Mild diverticulosis was noted through the entire examined colon 3. Single 75mm polyp was found in the ascending colon; polypectomy was performed using snare cautery and biopsy forceps 4. Mild diverticulosis was notes in the descending colon and sigmoid colon.  5. Single polyp was found in the sigmoid colon, polypectomy was performed with cold forceps.  6. Single polyp was found in the rectosigmoid colon; polypectomy was performed with cold snare  7. Small internal hemorrhoids  8. Large mass was found at the cecum; multiple biopsies of the area were performed using cold forceps; injection (tattooing) was performed distal to the mass.    09/24/2019 Initial Biopsy   INTERPRETATION AND DIAGNOSIS:  A. Cecum, biopsy:  Invasive moderately differentiated adenocarcinoma.  see comment  B. Polyp @ ascending colon, polypectomy:  Tubular Adenoma  C. Polyp @ sigmoid colon Polypectomy:  hyperplastic polyp.  D. Polyp @ rectosigmoid colon, Polypectomy:  Hyperplastic Polyp      10/16/2019 Imaging   CT Chest IMPRESSION: 1. Multiple small pulmonary nodules measuring 5 mm or less in size in the lungs. These are nonspecific and are typically considered statistically likely benign. However, given the patient's history of primary malignancy, close attention on follow-up studies is recommended to ensure stability. 2. Aortic atherosclerosis, in addition to right coronary artery disease. Assessment for potential risk factor modification, dietary therapy or pharmacologic therapy may be warranted, if clinically indicated. 3. There are calcifications of the aortic valve and mitral annulus. Echocardiographic correlation for evaluation of potential valvular dysfunction may be warranted if clinically  indicated. 4. Small hiatal hernia.   Aortic Atherosclerosis (ICD10-I70.0).   11/01/2019 Initial Diagnosis   Colon cancer ([name])   11/01/2019 Surgery   LAPAROSCOPIC PARTIAL COLECTOMY by Dr Marcello Moores and Dr Johney Maine   11/01/2019 Pathology Results   FINAL MICROSCOPIC DIAGNOSIS:   A. COLON, PROXIMAL RIGHT, COLECTOMY:  - Invasive colonic adenocarcinoma, 5 cm.  - Tumor invades through the muscularis propria into pericolonic tissues.   - Margins of resection are not involved.  - Metastatic carcinoma in (5) of (13) lymph nodes.  - See oncology table.    MSI Stable  Mismatch repair normal  MLH1 - Preserved nuclear expression (greater 50% tumor expression) MSH2 - Preserved nuclear expression (greater 50% tumor expression) MSH6 - Preserved nuclear expression (greater 50% tumor expression) PMS2 - Preserved nuclear expression (greater 50% tumor expression)   11/01/2019 Cancer Staging   Staging form: Colon and Rectum, AJCC 8th Edition - Pathologic stage from 11/01/2019: Stage IIIB (pT3, pN2a, cM0) - Signed by Truitt Merle, MD on 11/29/2019   12/10/2019 Procedure   PAC placed 12/10/19   12/17/2019 - 06/01/2020 Chemotherapy   FOLFOX q2weeks starting in 2 weeks starting 12/17/19. Held 01/27/20-02/10/20 due to b/l PE. Oxaliplatin held C11-12 due to neuropathy. Completed on 06/01/20   03/02/2021 Imaging   CT CAP  IMPRESSION: 1. No findings of active/recurrent malignancy. Partial right hemicolectomy. 2. Endometrial stripe remains mildly thickened, but endometrial biopsy in May was negative for malignancy. 3. Progressive endplate sclerosis and endplate irregularity at T2-3, probably due to degenerative endplate findings. If the has referable upper thoracic pain/symptoms then thoracic spine MRI could be used for further workup. 4. Other imaging findings of potential clinical significance: Mild cardiomegaly. Aortic Atherosclerosis (ICD10-I70.0). Mild mitral valve calcification. Postoperative findings in the left  breast  with adjacent radiation port anteriorly in the left upper lobe. Tiny pulmonary nodules in the left lower lobe are unchanged from earliest available comparison of 10/17/2019 and probably benign although may merit surveillance. Multilevel lumbar impingement. Mild pelvic floor laxity.   08/26/2021 Imaging   EXAM: CT CHEST, ABDOMEN, AND PELVIS WITH CONTRAST  IMPRESSION: 1. Stable examination without new or progressive findings to suggest local recurrence or metastatic disease within the chest, abdomen, or pelvis. 2. Hepatomegaly with hepatic steatosis. 3. Sigmoid colonic diverticulosis without findings of acute diverticulitis. 4. Similar prominent endplate sclerosis and irregularity at T2-T3 is most consistent with Modic type endplate degenerative changes. However, if patient has referable upper thoracic pain consider further workup with thoracic spine MRI. 5. Similar thickening of the endometrial stripe measuring up to 8 mm, which was previously biopsied with results negative for malignancy. 6. Aortic Atherosclerosis (ICD10-I70.0).   11/10/2021 PET scan   IMPRESSION: 1. LEFT ovary is increased in size and is intensely hypermetabolic. While physiologic hypermetabolic ovarian tissue is not uncommon, the enlargement and asymmetric activity warrants further evaluation. Consider contrast pelvic MRI vs tissue sampling. 2. No evidence of metastatic colorectal carcinoma otherwise. 3. Post RIGHT hemicolectomy anatomy. 4. Evidence of radiation change in the LEFT upper lobe (remote breast cancer).     12/22/2021 Relapse/Recurrence    FINAL MICROSCOPIC DIAGNOSIS:   A. LEFT OVARY AND FALLOPIAN TUBE, SALPINGO OOPHORECTOMY:  - Metastatic moderately differentiated colonic adenocarcinoma involving left ovary  - Focal ovarian stromal calcification  - Segment of benign fallopian tube   B. UTERUS WITH RIGHT FALLOPIAN TUBE AND OVARY, HYSTERECTOMY AND SALPINGO-OOPHORECTOMY:  - Metastatic  moderately differentiated colonic adenocarcinoma involving right ovary  - Focal invasive extensive adenomyosis  - Benign endometrial polyps  - Benign proliferative phase endometrium  - Hydrosalpinx of right fallopian tube  - Focal ovarian stromal calcification   C. PERITONEAL DEPOSITS, ANTERIOR CUL DE SAC, BIOPSY:  - Metastatic mucinous adenocarcinoma, consistent with colorectal primary    COMMENT:  Immunohistochemical stains show that the tumor cells are positive for CK20 and CDX2 while they are negative for CK7 and PAX8, consistent with above interpretation.    01/24/2022 -  Chemotherapy   Patient is on Treatment Plan : COLORECTAL FOLFOX q14d x 3 months      12/22/21: Robotic-assisted laparoscopic total hysterectomy with bilateral salpingoophorectomy  Interval History: Patient reports doing well.  She denies any abdominal or pelvic pain.  She never had to use the tramadol after surgery.  She endorses baseline bowel bladder function.  She endorses a good appetite without nausea or emesis.  Denies any issues with her incisions.  Denies any bleeding or discharge.  Past Medical/Surgical History: Past Medical History:  Diagnosis Date   Aortic atherosclerosis (HCC)    Arthritis    feet, lower back   Basal cell carcinoma    arm   Breast cancer of upper-outer quadrant of left female breast ([place]) 06/04/2014   Cataract    immature on the left   Colon cancer ([place]) 08/2019   Diabetes mellitus without complication (HCC)    Diverticulosis    Dizziness    > 40yrs ago;took Antivert    Family history of anesthesia complication    sister slow to wake up with anesthesia   Family history of breast cancer    Family history of colon cancer    Family history of uterine cancer    GERD (gastroesophageal reflux disease)    takes occasional TUMs   History of bronchitis    >  33yrs ago   History of colon polyps    History of hiatal hernia    Small noted on CT   History of pulmonary embolus (PE)     Hypertension    takes Losartan daily and HCTZ   Iron deficiency anemia    Joint pain    Numbness    to toes on each foot   Peripheral neuropathy    feet and toes   Personal history of radiation therapy    Pulmonary nodules    Noted on CT   Radiation 07/31/14-08/28/14   Left Breast 20 fxs   Seasonal allergies    takes Claritin prn   Urinary frequency    Vitamin D deficiency    takes VIt D daily    Past Surgical History:  Procedure Laterality Date   BREAST BIOPSY Bilateral    BREAST LUMPECTOMY Left    BREAST LUMPECTOMY WITH RADIOACTIVE SEED LOCALIZATION Left 07/01/2014   Procedure: LEFT BREAST LUMPECTOMY WITH RADIOACTIVE SEED LOCALIZATION;  Surgeon: Excell Seltzer, MD;  Location: [place];  Service: General;  Laterality: Left;   CATARACT EXTRACTION Right    COLONOSCOPY  09/24/2019   [place]   LAPAROSCOPIC PARTIAL COLECTOMY N/A 11/01/2019   Procedure: LAPAROSCOPIC PARTIAL COLECTOMY;  Surgeon: Leighton Ruff, MD;  Location: [facility];  Service: General;  Laterality: N/A;   POLYPECTOMY     PORTACATH PLACEMENT N/A 12/10/2019   Procedure: INSERTION PORT-A-CATH ULTRASOUND GUIDED IN RIGHT IJ;  Surgeon: Leighton Ruff, MD;  Location: [facility];  Service: General;  Laterality: N/A;   ROBOTIC ASSISTED TOTAL HYSTERECTOMY Bilateral 12/22/2021   Procedure: XI ROBOTIC ASSISTED TOTAL HYSTERECTOMY WITH BILATERAL SALPINGO OOPHORECTOMY;  Surgeon: [name], MD;  Location: [facility];  Service: Gynecology;  Laterality: Bilateral;   TOTAL KNEE ARTHROPLASTY Left 10/24/2012   Procedure: TOTAL KNEE ARTHROPLASTY;  Surgeon: Kerin Salen, MD;  Location: [place];  Service: Orthopedics;  Laterality: Left;   TOTAL KNEE ARTHROPLASTY Right 01/09/2013   Procedure: TOTAL KNEE ARTHROPLASTY;  Surgeon: Kerin Salen, MD;  Location: [place];  Service: Orthopedics;  Laterality: Right;   TUBAL LIGATION      Family History  Problem Relation Age of Onset   Diabetes Mother    Uterine cancer Mother         deceased 35   Hypertension Father    Heart disease Father    Diabetes Sister    Breast cancer Sister 69       currently 60   Hypertension Sister    Diabetes Sister    Breast cancer Sister    Diabetes Sister    Kidney disease Sister    Heart disease Sister    Lupus Sister    Heart disease Sister    Diabetes Sister    Diabetes Sister    Hypertension Brother    Diabetes Brother    Colon cancer Brother 28   Colon polyps Daughter    Thyroid cancer Daughter 70       currently 65; type?   Cancer Maternal Uncle        unk. primary; deceased 33s   Stroke Maternal Grandmother    Esophageal cancer Neg Hx    Rectal cancer Neg Hx    Stomach cancer Neg Hx     Social History   Socioeconomic History   Marital status: Married    Spouse name: Not on file   Number of children: 3   Years of education: Not on file   Highest  education level: Not on file  Occupational History   Occupation: Retired    Comment: Dispensing optician  Tobacco Use   Smoking status: Never   Smokeless tobacco: Never  Vaping Use   Vaping Use: Never used  Substance and Sexual Activity   Alcohol use: No   Drug use: No   Sexual activity: Yes    Birth control/protection: Surgical, Post-menopausal  Other Topics Concern   Not on file  Social History Narrative   Not on file   Social Determinants of Health   Financial Resource Strain: Low Risk  (09/01/2021)   Overall Financial Resource Strain (CARDIA)    Difficulty of Paying Living Expenses: Not hard at all  Food Insecurity: No Food Insecurity (09/01/2021)   Hunger Vital Sign    Worried About Running Out of Food in the Last Year: Never true    [place] in the Last Year: Never true  Transportation Needs: No Transportation Needs (09/01/2021)   PRAPARE - Hydrologist (Medical): No    Lack of Transportation (Non-Medical): No  Physical Activity: Insufficiently Active (09/01/2021)   Exercise Vital Sign    Days of Exercise per  Week: 2 days    Minutes of Exercise per Session: 20 min  Stress: No Stress Concern Present (09/01/2021)   [place]    Feeling of Stress : Not at all  Social Connections: Moderately Isolated (09/01/2021)   Social Connection and Isolation Panel [NHANES]    Frequency of Communication with Friends and Family: Three times a week    Frequency of Social Gatherings with Friends and Family: Three times a week    Attends Religious Services: Never    Active Member of Clubs or Organizations: No    Attends Archivist Meetings: Never    Marital Status: Married    Current Medications:  Current Outpatient Medications:    acetaminophen (TYLENOL) 325 MG tablet, Take 650 mg by mouth every 6 (six) hours as needed for moderate pain., Disp: , Rfl:    b complex vitamins capsule, Take 1 capsule by mouth daily., Disp: , Rfl:    Cholecalciferol (VITAMIN D) 2000 UNITS CAPS, Take 2,000 Units by mouth daily. , Disp: , Rfl:    loratadine (CLARITIN) 10 MG tablet, Take 10 mg by mouth daily as needed for allergies., Disp: , Rfl:    losartan (COZAAR) 25 MG tablet, Take 1 tablet (25 mg total) by mouth daily., Disp: 90 tablet, Rfl: 3   metFORMIN (GLUCOPHAGE) 500 MG tablet, Take 1 tablet (500 mg total) by mouth in the morning and at bedtime., Disp: 180 tablet, Rfl: 3   Multiple Vitamins-Minerals (MULTIVITAMIN WITH MINERALS) tablet, Take 1 tablet by mouth daily., Disp: , Rfl:    omeprazole (PRILOSEC) 20 MG capsule, Take 20 mg by mouth daily before breakfast. , Disp: , Rfl:    pravastatin (PRAVACHOL) 10 MG tablet, Take 1 tablet (10 mg total) by mouth daily., Disp: 90 tablet, Rfl: 3   pregabalin (LYRICA) 25 MG capsule, Take 1 capsule by mouth once daily, Disp: 30 capsule, Rfl: 0   lidocaine-prilocaine (EMLA) cream, Apply to affected area once, Disp: 30 g, Rfl: 3   ondansetron (ZOFRAN) 8 MG tablet, Take 1 tablet (8 mg total) by mouth every 8 (eight) hours  as needed for nausea or vomiting. Start on the third day after chemotherapy., Disp: 30 tablet, Rfl: 1   prochlorperazine (COMPAZINE) 10 MG tablet, Take 1  tablet (10 mg total) by mouth every 6 (six) hours as needed for nausea or vomiting., Disp: 30 tablet, Rfl: 1  Review of Systems: Denies appetite changes, fevers, chills, fatigue, unexplained weight changes. Denies hearing loss, neck lumps or masses, mouth sores, ringing in ears or voice changes. Denies cough or wheezing.  Denies shortness of breath. Denies chest pain or palpitations. Denies leg swelling. Denies abdominal distention, pain, blood in stools, constipation, diarrhea, nausea, vomiting, or early satiety. Denies pain with intercourse, dysuria, frequency, hematuria or incontinence. Denies hot flashes, pelvic pain, vaginal bleeding or vaginal discharge.   Denies joint pain, back pain or muscle pain/cramps. Denies itching, rash, or wounds. Denies dizziness, headaches, numbness or seizures. Denies swollen lymph nodes or glands, denies easy bruising or bleeding. Denies anxiety, depression, confusion, or decreased concentration.  Physical Exam: BP (!) 169/78 (BP Location: Left Arm, Patient Position: Sitting)   Pulse 90   Temp 98.3 F (36.8 C) (Oral)   Resp 16   Ht $R'5\' 5"'lj$  (1.651 m)   Wt 244 lb (110.7 kg)   SpO2 98%   BMI 40.60 kg/m  General: Alert, oriented, no acute distress. HEENT: Normocephalic, atraumatic, sclera anicteric. Chest: Unlabored breathing on room air. Abdomen: Obese, soft, nontender.  Normoactive bowel sounds.  No masses or hepatosplenomegaly appreciated.  Well-healed incisions, remaining Dermabond removed. Extremities: Grossly normal range of motion.  Warm, well perfused.  No edema bilaterally.  GU: Normal appearing external genitalia without erythema, excoriation, or lesions.  Speculum exam reveals cuff intact, suture visible.  No blood or discharge in the vault.  Bimanual exam reveals cuff intact, no fluctuance  or tenderness with palpation.   Laboratory & Radiologic Studies: A. LEFT OVARY AND FALLOPIAN TUBE, SALPINGO OOPHORECTOMY:  - Metastatic moderately differentiated colonic adenocarcinoma involving  left ovary  - Focal ovarian stromal calcification  - Segment of benign fallopian tube   B. UTERUS WITH RIGHT FALLOPIAN TUBE AND OVARY, HYSTERECTOMY AND  SALPINGO-OOPHORECTOMY:  - Metastatic moderately differentiated colonic adenocarcinoma involving  right ovary  - Focal invasive extensive adenomyosis  - Benign endometrial polyps  - Benign proliferative phase endometrium  - Hydrosalpinx of right fallopian tube  - Focal ovarian stromal calcification   C. PERITONEAL DEPOSITS, ANTERIOR CUL DE SAC, BIOPSY:  - Metastatic mucinous adenocarcinoma, consistent with colorectal primary   Assessment & Plan: Kim Castro is a 76 y.o. woman with recurrent colon adenocarcinoma s/p surgical debulking.  Patient is doing very well from a postoperative standpoint.  She is meeting all milestones.  Discussed continued restrictions and postoperative expectations.  From my standpoint, she can start chemotherapy at any time.  He is scheduled for her first infusion next week with Dr. Burr Medico.  Pathology and findings from surgery reviewed with the patient.  She was given a copy of her pathology report which we discussed in detail together today.  18 minutes of total time was spent for this patient encounter, including preparation, face-to-face counseling with the patient and coordination of care, and documentation of the encounter.  Jeral Pinch, MD  Division of Gynecologic Oncology  Department of Obstetrics and Gynecology  [facility]

## 2022-01-14 NOTE — Patient Instructions (Signed)
It was good to see you.  You are healing great from surgery!  Please remember, no heavy lifting, pushing, or pulling until 6 weeks after surgery.  Nothing in the vagina for at least 8-10 weeks.  Do not hesitate to call my office if you need anything in the future.

## 2022-01-17 ENCOUNTER — Other Ambulatory Visit: Payer: Self-pay | Admitting: Hematology

## 2022-01-17 DIAGNOSIS — R202 Paresthesia of skin: Secondary | ICD-10-CM

## 2022-01-20 ENCOUNTER — Other Ambulatory Visit: Payer: Self-pay

## 2022-01-20 DIAGNOSIS — C189 Malignant neoplasm of colon, unspecified: Secondary | ICD-10-CM | POA: Diagnosis not present

## 2022-01-21 ENCOUNTER — Encounter (HOSPITAL_COMMUNITY): Payer: Self-pay | Admitting: Hematology

## 2022-01-21 MED FILL — Dexamethasone Sodium Phosphate Inj 100 MG/10ML: INTRAMUSCULAR | Qty: 1 | Status: AC

## 2022-01-24 ENCOUNTER — Inpatient Hospital Stay: Payer: Medicare Other

## 2022-01-24 ENCOUNTER — Encounter: Payer: Self-pay | Admitting: Hematology

## 2022-01-24 ENCOUNTER — Inpatient Hospital Stay: Payer: Medicare Other | Admitting: Hematology

## 2022-01-24 ENCOUNTER — Other Ambulatory Visit: Payer: Self-pay

## 2022-01-24 VITALS — BP 140/66 | HR 76 | Temp 98.4°F | Resp 17

## 2022-01-24 DIAGNOSIS — I10 Essential (primary) hypertension: Secondary | ICD-10-CM | POA: Diagnosis not present

## 2022-01-24 DIAGNOSIS — Z923 Personal history of irradiation: Secondary | ICD-10-CM | POA: Diagnosis not present

## 2022-01-24 DIAGNOSIS — C182 Malignant neoplasm of ascending colon: Secondary | ICD-10-CM

## 2022-01-24 DIAGNOSIS — C779 Secondary and unspecified malignant neoplasm of lymph node, unspecified: Secondary | ICD-10-CM | POA: Diagnosis not present

## 2022-01-24 DIAGNOSIS — E1142 Type 2 diabetes mellitus with diabetic polyneuropathy: Secondary | ICD-10-CM | POA: Diagnosis not present

## 2022-01-24 DIAGNOSIS — C786 Secondary malignant neoplasm of retroperitoneum and peritoneum: Secondary | ICD-10-CM

## 2022-01-24 DIAGNOSIS — Z95828 Presence of other vascular implants and grafts: Secondary | ICD-10-CM

## 2022-01-24 DIAGNOSIS — C7963 Secondary malignant neoplasm of bilateral ovaries: Secondary | ICD-10-CM | POA: Diagnosis not present

## 2022-01-24 DIAGNOSIS — Z86000 Personal history of in-situ neoplasm of breast: Secondary | ICD-10-CM | POA: Diagnosis not present

## 2022-01-24 DIAGNOSIS — C180 Malignant neoplasm of cecum: Secondary | ICD-10-CM | POA: Diagnosis not present

## 2022-01-24 DIAGNOSIS — Z90710 Acquired absence of both cervix and uterus: Secondary | ICD-10-CM | POA: Diagnosis not present

## 2022-01-24 DIAGNOSIS — Z800 Family history of malignant neoplasm of digestive organs: Secondary | ICD-10-CM | POA: Diagnosis not present

## 2022-01-24 DIAGNOSIS — E1136 Type 2 diabetes mellitus with diabetic cataract: Secondary | ICD-10-CM | POA: Diagnosis not present

## 2022-01-24 DIAGNOSIS — Z803 Family history of malignant neoplasm of breast: Secondary | ICD-10-CM | POA: Diagnosis not present

## 2022-01-24 LAB — CBC WITH DIFFERENTIAL (CANCER CENTER ONLY)
Abs Immature Granulocytes: 0.02 10*3/uL (ref 0.00–0.07)
Basophils Absolute: 0.1 10*3/uL (ref 0.0–0.1)
Basophils Relative: 1 %
Eosinophils Absolute: 0.2 10*3/uL (ref 0.0–0.5)
Eosinophils Relative: 3 %
HCT: 37 % (ref 36.0–46.0)
Hemoglobin: 12.4 g/dL (ref 12.0–15.0)
Immature Granulocytes: 0 %
Lymphocytes Relative: 36 %
Lymphs Abs: 2.7 10*3/uL (ref 0.7–4.0)
MCH: 32.8 pg (ref 26.0–34.0)
MCHC: 33.5 g/dL (ref 30.0–36.0)
MCV: 97.9 fL (ref 80.0–100.0)
Monocytes Absolute: 0.6 10*3/uL (ref 0.1–1.0)
Monocytes Relative: 8 %
Neutro Abs: 3.8 10*3/uL (ref 1.7–7.7)
Neutrophils Relative %: 52 %
Platelet Count: 217 10*3/uL (ref 150–400)
RBC: 3.78 MIL/uL — ABNORMAL LOW (ref 3.87–5.11)
RDW: 12.7 % (ref 11.5–15.5)
WBC Count: 7.4 10*3/uL (ref 4.0–10.5)
nRBC: 0 % (ref 0.0–0.2)

## 2022-01-24 LAB — CMP (CANCER CENTER ONLY)
ALT: 14 U/L (ref 0–44)
AST: 18 U/L (ref 15–41)
Albumin: 4.1 g/dL (ref 3.5–5.0)
Alkaline Phosphatase: 71 U/L (ref 38–126)
Anion gap: 8 (ref 5–15)
BUN: 24 mg/dL — ABNORMAL HIGH (ref 8–23)
CO2: 26 mmol/L (ref 22–32)
Calcium: 9.1 mg/dL (ref 8.9–10.3)
Chloride: 107 mmol/L (ref 98–111)
Creatinine: 0.81 mg/dL (ref 0.44–1.00)
GFR, Estimated: >60 mL/min (ref >60)
Glucose, Bld: 170 mg/dL — ABNORMAL HIGH (ref 70–99)
Potassium: 4 mmol/L (ref 3.5–5.1)
Sodium: 141 mmol/L (ref 135–145)
Total Bilirubin: 0.9 mg/dL (ref 0.3–1.2)
Total Protein: 6.8 g/dL (ref 6.5–8.1)

## 2022-01-24 MED ORDER — DEXTROSE 5 % IV SOLN: Freq: Once | INTRAVENOUS | Status: AC

## 2022-01-24 MED ORDER — FLUOROURACIL CHEMO INJECTION 2.5 GM/50ML
400.0000 mg/m2 | Freq: Once | INTRAVENOUS | Status: AC
  Administered 2022-01-24: 900 mg via INTRAVENOUS
  Filled 2022-01-24: qty 18

## 2022-01-24 MED ORDER — OXALIPLATIN CHEMO INJECTION 100 MG/20ML
70.0000 mg/m2 | Freq: Once | INTRAVENOUS | Status: AC
  Administered 2022-01-24: 160 mg via INTRAVENOUS
  Filled 2022-01-24: qty 32

## 2022-01-24 MED ORDER — SODIUM CHLORIDE 0.9% FLUSH
10.0000 mL | INTRAVENOUS | Status: DC | PRN
  Administered 2022-01-24: 10 mL

## 2022-01-24 MED ORDER — PALONOSETRON HCL INJECTION 0.25 MG/5ML
0.2500 mg | Freq: Once | INTRAVENOUS | Status: AC
  Administered 2022-01-24: 0.25 mg via INTRAVENOUS
  Filled 2022-01-24: qty 5

## 2022-01-24 MED ORDER — LEUCOVORIN CALCIUM INJECTION 350 MG
400.0000 mg/m2 | Freq: Once | INTRAVENOUS | Status: AC
  Administered 2022-01-24: 908 mg via INTRAVENOUS
  Filled 2022-01-24: qty 45.4

## 2022-01-24 MED ORDER — SODIUM CHLORIDE 0.9 % IV SOLN
2400.0000 mg/m2 | INTRAVENOUS | Status: DC
  Administered 2022-01-24: 5450 mg via INTRAVENOUS
  Filled 2022-01-24: qty 109

## 2022-01-24 MED ORDER — SODIUM CHLORIDE 0.9 % IV SOLN
10.0000 mg | Freq: Once | INTRAVENOUS | Status: AC
  Administered 2022-01-24: 10 mg via INTRAVENOUS
  Filled 2022-01-24: qty 10

## 2022-01-24 NOTE — Patient Instructions (Signed)
[facility]  Discharge Instructions: Thank you for choosing [facility] to provide your oncology and hematology care.   If you have a lab appointment with the Cancer Center, please go directly to the Cancer Center and check in at the registration area.   Wear comfortable clothing and clothing appropriate for easy access to any Portacath or PICC line.   We strive to give you quality time with your provider. You may need to reschedule your appointment if you arrive late (15 or more minutes).  Arriving late affects you and other patients whose appointments are after yours.  Also, if you miss three or more appointments without notifying the office, you may be dismissed from the clinic at the provider's discretion.      For prescription refill requests, have your pharmacy contact our office and allow 72 hours for refills to be completed.    Today you received the following chemotherapy and/or immunotherapy agents: Oxaliplatin, Leucovorin, Fluorouracil.       To help prevent nausea and vomiting after your treatment, we encourage you to take your nausea medication as directed.  BELOW ARE SYMPTOMS THAT SHOULD BE REPORTED IMMEDIATELY: *FEVER GREATER THAN 100.4 F (38 C) OR HIGHER *CHILLS OR SWEATING *NAUSEA AND VOMITING THAT IS NOT CONTROLLED WITH YOUR NAUSEA MEDICATION *UNUSUAL SHORTNESS OF BREATH *UNUSUAL BRUISING OR BLEEDING *URINARY PROBLEMS (pain or burning when urinating, or frequent urination) *BOWEL PROBLEMS (unusual diarrhea, constipation, pain near the anus) TENDERNESS IN MOUTH AND THROAT WITH OR WITHOUT PRESENCE OF ULCERS (sore throat, sores in mouth, or a toothache) UNUSUAL RASH, SWELLING OR PAIN  UNUSUAL VAGINAL DISCHARGE OR ITCHING   Items with * indicate a potential emergency and should be followed up as soon as possible or go to the Emergency Department if any problems should occur.  Please show the CHEMOTHERAPY ALERT CARD or  IMMUNOTHERAPY ALERT CARD at check-in to the Emergency Department and triage nurse.  Should you have questions after your visit or need to cancel or reschedule your appointment, please contact [facility]  Dept: [phone]  and follow the prompts.  Office hours are 8:00 a.m. to 4:30 p.m. Monday - Friday. Please note that voicemails left after 4:00 p.m. may not be returned until the following business day.  We are closed weekends and major holidays. You have access to a nurse at all times for urgent questions. Please call the main number to the clinic Dept: [phone] and follow the prompts.   For any non-urgent questions, you may also contact your provider using MyChart. We now offer e-Visits for anyone 18 and older to request care online for non-urgent symptoms. For details visit [email or web address].   Also download the MyChart app! Go to the app store, search "MyChart", open the app, select [facility], and log in with your MyChart username and password.  Masks are optional in the cancer centers. If you would like for your care team to wear a mask while they are taking care of you, please let them know. You may have one support Porchea Charrier who is at least 74 years old accompany you for your appointments. 

## 2022-01-24 NOTE — Progress Notes (Signed)
[facility]   [phone] [phone]   Clinic Follow up Note   Patient Care Team: Rudd, Stephen M, MD as PCP - General (Family Medicine) Wakefield, Matthew, MD as Consulting Physician (General Surgery) Feng, Yan, MD as Consulting Physician (Hematology) Thomas, Alicia, MD as Consulting Physician (General Surgery) Gudena, Vinay, MD as Consulting Physician (Hematology and Oncology)  Date of Service:  01/24/2022  CHIEF COMPLAINT: f/u of metastatic colon cancer, h/o DCIS  CURRENT THERAPY:  FOLFOX, q14d, restarted 01/24/22  ASSESSMENT & PLAN:  Kim Castro is a 75 y.o. female with   1. Right colon cancer, pT3N2aM0 stage IIB, MSS, metastatic to b/l ovaries and peritoneum, KRAS mutation G12V (+)  -She was diagnosed in 08/2019 with cecal mass biopsy showed moderately differentiated adenocarcinoma. CT chest from 10/16/19 did shows multiple small lung nodules that were nonspecific but likely benign. Will monitor.  -She underwent colon surgery with Dr Thomas on 11/01/19. Path showed overall Stage IIIB cancer.  -She received 6 months of FOLFOX to reduce her risk of recurrence, completed 05/2020.  -surveillance colonoscopy on 02/10/21 under Dr. Gessner showed only diverticulosis. Repeat due in 01/2024. -restaging CT CAP 08/26/21 was NED. -restaging PET scan on 11/10/21 showed hypermetabolism to enlarged left ovary, no other evidence of metastatic disease. -she underwent total hysterectomy on 12/22/21 with Dr. Tucker. Path revealed metastatic moderately differentiated colonic adenocarcinoma involving both ovaries. Peritoneal deposits were also positive for metastatic mucinous adenocarcinoma, consistent with colorectal primary. -her CEA as also risen, consistent with worsening disease. Most recently up to 22.67 on 01/13/22. -[employer] showed KRAS mutation. She is not eligible for any targeted therapy.  -she is scheduled to restart FOLFOX today, 01/24/22. She has mild  neuropathy, I will slightly reduce oxaliplatin dose. -I plan to refer her to Dr. Shen at [facility] for consideration of HIPEC surgery. Because of this, I do not plan to give her bevacizumab. She wants to check with her insurance first  -labs reviewed, overall WNL and adequate to proceed with FOLFOX as scheduled.   2. Neuropathy, secondary to Oxaliplatin G1 -S/p C7 she started having numbness in hands, L>R which become prolonged with tingling s/p C10.  -Oxaliplatin dose reduced and ultimately held with C11-12.  -she is on Lyrica and B vit complex, mild and stable   3. H/o left breast DCIS, G2, ER/PR+ -Dx in 05/2014, s/p left lumpectomy with Dr Hoxworth, adjuvant RT with Dr Wentworth, and Anastrozole 08/2014 - 01/13/20 under Dr Gudena.  -DEXA 09/11/20 T score +1.2 normal.  -most recent mammogram on 10/04/21 was negative. -We again discussed a breast MRI for breast cancer screening; she is agreeable. We will plan for this in 04/2022.   4. Submassive bilateral pulmonary embolism, LE Edema  -S/p C3 chemo, her 01/27/20 CTA showed PE with right heart strain. She also has bilateral lower extremity edema and Doppler ultrasound was negative for DVT bilaterally.  -She was treated with 6 months of Eliquis, Lasix and oral potassium BID for LE Edema.   5. Comorbidities: Arthritis, DM, HTN, GERD  -she switched to Dr. Rudd for primary care in 05/2021.     PLAN:  -proceed with C1 FOLFOX with slight dose reduction of oxaliplation due to her mild neuropathy  -lab, flush, f/u, and FOLFOX every 2 weeks             -she prefers Monday appointments   No problem-specific Assessment & Plan notes found for this encounter.   SUMMARY OF ONCOLOGIC HISTORY: Oncology History Overview   Note   Cancer Staging  Malignant neoplasm of female breast [facility]) Staging form: Breast, AJCC 7th Edition - Clinical stage from 06/11/2014: Stage 0 (Tis (DCIS), N0, M0) - Unsigned Staged by: Pathologist and managing physician Laterality:  Left Estrogen receptor status: Positive Progesterone receptor status: Positive Stage used in treatment planning: Yes National guidelines used in treatment planning: Yes Type of national guideline used in treatment planning: NCCN - Pathologic stage from 07/03/2014: Stage Unknown (Tis (DCIS), NX, cM0) - Signed by Enid Cutter, MD on 07/10/2014 Staged by: Pathologist Laterality: Left Estrogen receptor status: Positive Progesterone receptor status: Positive Stage used in treatment planning: Yes National guidelines used in treatment planning: Yes Type of national guideline used in treatment planning: NCCN Staging comments: Staged on final lumpectomy specimen by Dr. Donato Heinz.  Right colon cancer Staging form: Colon and Rectum, AJCC 8th Edition - Pathologic stage from 11/01/2019: Stage IIIB (pT3, pN2a, cM0) - Signed by Truitt Merle, MD on 11/29/2019 Stage prefix: Initial diagnosis Histologic grading system: 4 grade system Histologic grade (G): G2 Residual tumor (R): R0 - None Tumor deposits (TD): Absent Perineural invasion (PNI): Absent Microsatellite instability (MSI): Stable KRAS mutation: Unknown NRAS mutation: Unknown BRAF mutation: Unknown     Malignant neoplasm of female breast ([place])  05/29/2014 Initial Biopsy   Left breast needle core biopsy: Grade 2, DCIS with calcs. ER+ (100%), PR+ (96%).    06/04/2014 Initial Diagnosis   Left breast DCIS with calcifications, ER 100%, PR 96%   06/10/2014 Breast MRI   Left breast: 2.4 x 1.3 x 1.1 cm area of patchy non-mass enhancement upper outer quadrant includes postbiopsy seroma; Right breast: 1.2 cm previously biopsied stable benign fibroadenoma   06/12/2014 Procedure   Genetic counseling/testing: Identified 1 VUS on CHEK2 gene. Remainder of 17 gene panel tested negative and included: ATM, BARD1, BRCA 1/2, BRIP1, CDH1, CHEK2, EPCAM, MLH1, MSH2, MSH6, NBN, NF1, PALB2, PTEN, RAD50, RAD51C, RAD51D, STK11, and TP53.    07/01/2014 Surgery   Left breast  lumpectomy (Hoxworth): Grade 1, DCIS, spanning 2.3 cm, 1 mm margin, ER 100%, PR 96%   07/31/2014 - 08/28/2014 Radiation Therapy   Adjuvant RT completed [facility]). Left breast: Total dose 42.5 Gy over 17 fractions. Left breast boost: Total dose 7.5 Gy over 3 fractions.    09/14/2014 - 01/13/2020 Anti-estrogen oral therapy   Anastrazole 74m daily. Planned duration of treatment: 5 years ([place]. Completed in 01/2020.    09/25/2014 Survivorship   Survivorship Care Plan given to patient and reviewed with her in person.    03/02/2021 Imaging   CT CAP  IMPRESSION: 1. No findings of active/recurrent malignancy. Partial right hemicolectomy. 2. Endometrial stripe remains mildly thickened, but endometrial biopsy in May was negative for malignancy. 3. Progressive endplate sclerosis and endplate irregularity at T2-3, probably due to degenerative endplate findings. If the has referable upper thoracic pain/symptoms then thoracic spine MRI could be used for further workup. 4. Other imaging findings of potential clinical significance: Mild cardiomegaly. Aortic Atherosclerosis (ICD10-I70.0). Mild mitral valve calcification. Postoperative findings in the left breast with adjacent radiation port anteriorly in the left upper lobe. Tiny pulmonary nodules in the left lower lobe are unchanged from earliest available comparison of 10/17/2019 and probably benign although may merit surveillance. Multilevel lumbar impingement. Mild pelvic floor laxity.   Right colon cancer  09/19/2019 Imaging   CT AP W contrast 09/19/19  IMPRESSION Fullness in the cecum, cannot exclude a mass. No evidence for metastatic disease is identified.    09/24/2019 Procedure  Colonoscopy by Dr Eber Jones 09/24/19 IMPRESSION 1. The colon was redundant  2. Mild diverticulosis was noted through the entire examined colon 3. Single 88m polyp was found in the ascending colon; polypectomy was performed using snare cautery and biopsy forceps 4.  Mild diverticulosis was notes in the descending colon and sigmoid colon.  5. Single polyp was found in the sigmoid colon, polypectomy was performed with cold forceps.  6. Single polyp was found in the rectosigmoid colon; polypectomy was performed with cold snare  7. Small internal hemorrhoids  8. Large mass was found at the cecum; multiple biopsies of the area were performed using cold forceps; injection (tattooing) was performed distal to the mass.    09/24/2019 Initial Biopsy   INTERPRETATION AND DIAGNOSIS:  A. Cecum, biopsy:  Invasive moderately differentiated adenocarcinoma.  see comment  B. Polyp @ ascending colon, polypectomy:  Tubular Adenoma  C. Polyp @ sigmoid colon Polypectomy:  hyperplastic polyp.  D. Polyp @ rectosigmoid colon, Polypectomy:  Hyperplastic Polyp      10/16/2019 Imaging   CT Chest IMPRESSION: 1. Multiple small pulmonary nodules measuring 5 mm or less in size in the lungs. These are nonspecific and are typically considered statistically likely benign. However, given the patient's history of primary malignancy, close attention on follow-up studies is recommended to ensure stability. 2. Aortic atherosclerosis, in addition to right coronary artery disease. Assessment for potential risk factor modification, dietary therapy or pharmacologic therapy may be warranted, if clinically indicated. 3. There are calcifications of the aortic valve and mitral annulus. Echocardiographic correlation for evaluation of potential valvular dysfunction may be warranted if clinically indicated. 4. Small hiatal hernia.   Aortic Atherosclerosis (ICD10-I70.0).   11/01/2019 Initial Diagnosis   Colon cancer ([place]   11/01/2019 Surgery   LAPAROSCOPIC PARTIAL COLECTOMY by Dr TMarcello Mooresand Dr GJohney Maine  11/01/2019 Pathology Results   FINAL MICROSCOPIC DIAGNOSIS:   A. COLON, PROXIMAL RIGHT, COLECTOMY:  - Invasive colonic adenocarcinoma, 5 cm.  - Tumor invades through the muscularis propria  into pericolonic tissues.   - Margins of resection are not involved.  - Metastatic carcinoma in (5) of (13) lymph nodes.  - See oncology table.    MSI Stable  Mismatch repair normal  MLH1 - Preserved nuclear expression (greater 50% tumor expression) MSH2 - Preserved nuclear expression (greater 50% tumor expression) MSH6 - Preserved nuclear expression (greater 50% tumor expression) PMS2 - Preserved nuclear expression (greater 50% tumor expression)   11/01/2019 Cancer Staging   Staging form: Colon and Rectum, AJCC 8th Edition - Pathologic stage from 11/01/2019: Stage IIIB (pT3, pN2a, cM0) - Signed by FTruitt Merle MD on 11/29/2019   12/10/2019 Procedure   PAC placed 12/10/19   12/17/2019 - 06/01/2020 Chemotherapy   FOLFOX q2weeks starting in 2 weeks starting 12/17/19. Held 01/27/20-02/10/20 due to b/l PE. Oxaliplatin held C11-12 due to neuropathy. Completed on 06/01/20   03/02/2021 Imaging   CT CAP  IMPRESSION: 1. No findings of active/recurrent malignancy. Partial right hemicolectomy. 2. Endometrial stripe remains mildly thickened, but endometrial biopsy in May was negative for malignancy. 3. Progressive endplate sclerosis and endplate irregularity at T2-3, probably due to degenerative endplate findings. If the has referable upper thoracic pain/symptoms then thoracic spine MRI could be used for further workup. 4. Other imaging findings of potential clinical significance: Mild cardiomegaly. Aortic Atherosclerosis (ICD10-I70.0). Mild mitral valve calcification. Postoperative findings in the left breast with adjacent radiation port anteriorly in the left upper lobe. Tiny pulmonary nodules in the left lower lobe are unchanged  from earliest available comparison of 10/17/2019 and probably benign although may merit surveillance. Multilevel lumbar impingement. Mild pelvic floor laxity.   08/26/2021 Imaging   EXAM: CT CHEST, ABDOMEN, AND PELVIS WITH CONTRAST  IMPRESSION: 1. Stable examination  without new or progressive findings to suggest local recurrence or metastatic disease within the chest, abdomen, or pelvis. 2. Hepatomegaly with hepatic steatosis. 3. Sigmoid colonic diverticulosis without findings of acute diverticulitis. 4. Similar prominent endplate sclerosis and irregularity at T2-T3 is most consistent with Modic type endplate degenerative changes. However, if patient has referable upper thoracic pain consider further workup with thoracic spine MRI. 5. Similar thickening of the endometrial stripe measuring up to 8 mm, which was previously biopsied with results negative for malignancy. 6. Aortic Atherosclerosis (ICD10-I70.0).   11/10/2021 PET scan   IMPRESSION: 1. LEFT ovary is increased in size and is intensely hypermetabolic. While physiologic hypermetabolic ovarian tissue is not uncommon, the enlargement and asymmetric activity warrants further evaluation. Consider contrast pelvic MRI vs tissue sampling. 2. No evidence of metastatic colorectal carcinoma otherwise. 3. Post RIGHT hemicolectomy anatomy. 4. Evidence of radiation change in the LEFT upper lobe (remote breast cancer).     12/22/2021 Relapse/Recurrence    FINAL MICROSCOPIC DIAGNOSIS:   A. LEFT OVARY AND FALLOPIAN TUBE, SALPINGO OOPHORECTOMY:  - Metastatic moderately differentiated colonic adenocarcinoma involving left ovary  - Focal ovarian stromal calcification  - Segment of benign fallopian tube   B. UTERUS WITH RIGHT FALLOPIAN TUBE AND OVARY, HYSTERECTOMY AND SALPINGO-OOPHORECTOMY:  - Metastatic moderately differentiated colonic adenocarcinoma involving right ovary  - Focal invasive extensive adenomyosis  - Benign endometrial polyps  - Benign proliferative phase endometrium  - Hydrosalpinx of right fallopian tube  - Focal ovarian stromal calcification   C. PERITONEAL DEPOSITS, ANTERIOR CUL DE SAC, BIOPSY:  - Metastatic mucinous adenocarcinoma, consistent with colorectal primary    COMMENT:   Immunohistochemical stains show that the tumor cells are positive for CK20 and CDX2 while they are negative for CK7 and PAX8, consistent with above interpretation.    01/24/2022 -  Chemotherapy   Patient is on Treatment Plan : COLORECTAL FOLFOX q14d x 3 months        INTERVAL HISTORY:  Kim Castro is here for a follow up of metastatic colon cancer. She was last seen by me on 01/13/22. She presents to the clinic alone. She reports she is doing well overall, denies pain or low appetite. She reports her mild neuropathy is stable and does not impact her sleep much.   All other systems were reviewed with the patient and are negative.  MEDICAL HISTORY:  Past Medical History:  Diagnosis Date   Aortic atherosclerosis (HCC)    Arthritis    feet, lower back   Basal cell carcinoma    arm   Breast cancer of upper-outer quadrant of left female breast ([place]) 06/04/2014   Cataract    immature on the left   Colon cancer ([place]) 08/2019   Diabetes mellitus without complication (HCC)    Diverticulosis    Dizziness    > 31yr ago;took Antivert    Family history of anesthesia complication    sister slow to wake up with anesthesia   Family history of breast cancer    Family history of colon cancer    Family history of uterine cancer    GERD (gastroesophageal reflux disease)    takes occasional TUMs   History of bronchitis    > 89[place]ago   History of colon polyps  History of hiatal hernia    Small noted on CT   History of pulmonary embolus (PE)    Hypertension    takes Losartan daily and HCTZ   Iron deficiency anemia    Joint pain    Numbness    to toes on each foot   Peripheral neuropathy    feet and toes   Personal history of radiation therapy    Pulmonary nodules    Noted on CT   Radiation 07/31/14-08/28/14   Left Breast 20 fxs   Seasonal allergies    takes Claritin prn   Urinary frequency    Vitamin D deficiency    takes VIt D daily    SURGICAL HISTORY: Past Surgical  History:  Procedure Laterality Date   BREAST BIOPSY Bilateral    BREAST LUMPECTOMY Left    BREAST LUMPECTOMY WITH RADIOACTIVE SEED LOCALIZATION Left 07/01/2014   Procedure: LEFT BREAST LUMPECTOMY WITH RADIOACTIVE SEED LOCALIZATION;  Surgeon: Benjamin Hoxworth, MD;  Location: [facility];  Service: General;  Laterality: Left;   CATARACT EXTRACTION Right    COLONOSCOPY  09/24/2019   [place]   LAPAROSCOPIC PARTIAL COLECTOMY N/A 11/01/2019   Procedure: LAPAROSCOPIC PARTIAL COLECTOMY;  Surgeon: Thomas, Alicia, MD;  Location: [facility];  Service: General;  Laterality: N/A;   POLYPECTOMY     PORTACATH PLACEMENT N/A 12/10/2019   Procedure: INSERTION PORT-A-CATH ULTRASOUND GUIDED IN RIGHT IJ;  Surgeon: Thomas, Alicia, MD;  Location: [facility];  Service: General;  Laterality: N/A;   ROBOTIC ASSISTED TOTAL HYSTERECTOMY Bilateral 12/22/2021   Procedure: XI ROBOTIC ASSISTED TOTAL HYSTERECTOMY WITH BILATERAL SALPINGO OOPHORECTOMY;  Surgeon: Tucker, Katherine R, MD;  Location: [facility];  Service: Gynecology;  Laterality: Bilateral;   TOTAL KNEE ARTHROPLASTY Left 10/24/2012   Procedure: TOTAL KNEE ARTHROPLASTY;  Surgeon: Frank J Rowan, MD;  Location: [facility];  Service: Orthopedics;  Laterality: Left;   TOTAL KNEE ARTHROPLASTY Right 01/09/2013   Procedure: TOTAL KNEE ARTHROPLASTY;  Surgeon: Frank J Rowan, MD;  Location: [facility];  Service: Orthopedics;  Laterality: Right;   TUBAL LIGATION      I have reviewed the social history and family history with the patient and they are unchanged from previous note.  ALLERGIES:  is allergic to oxycodone.  MEDICATIONS:  Current Outpatient Medications  Medication Sig Dispense Refill   acetaminophen (TYLENOL) 325 MG tablet Take 650 mg by mouth every 6 (six) hours as needed for moderate pain.     b complex vitamins capsule Take 1 capsule by mouth daily.     Cholecalciferol (VITAMIN D) 2000 UNITS CAPS Take 2,000 Units by mouth daily.      lidocaine-prilocaine (EMLA)  cream Apply to affected area once 30 g 3   loratadine (CLARITIN) 10 MG tablet Take 10 mg by mouth daily as needed for allergies.     losartan (COZAAR) 25 MG tablet Take 1 tablet (25 mg total) by mouth daily. 90 tablet 3   metFORMIN (GLUCOPHAGE) 500 MG tablet Take 1 tablet (500 mg total) by mouth in the morning and at bedtime. 180 tablet 3   Multiple Vitamins-Minerals (MULTIVITAMIN WITH MINERALS) tablet Take 1 tablet by mouth daily.     omeprazole (PRILOSEC) 20 MG capsule Take 20 mg by mouth daily before breakfast.      ondansetron (ZOFRAN) 8 MG tablet Take 1 tablet (8 mg total) by mouth every 8 (eight) hours as needed for nausea or vomiting. Start on the third day after chemotherapy. 30 tablet 1   pravastatin (PRAVACHOL)   10 MG tablet Take 1 tablet (10 mg total) by mouth daily. 90 tablet 3   pregabalin (LYRICA) 25 MG capsule Take 1 capsule by mouth once daily 30 capsule 0   prochlorperazine (COMPAZINE) 10 MG tablet Take 1 tablet (10 mg total) by mouth every 6 (six) hours as needed for nausea or vomiting. 30 tablet 1   No current facility-administered medications for this visit.    PHYSICAL EXAMINATION: ECOG PERFORMANCE STATUS: 0 - Asymptomatic  Vitals:   01/24/22 0815  BP: (!) 140/66  Pulse: 76  Resp: 17  Temp: 98.4 F (36.9 C)  SpO2: 98%   Wt Readings from Last 3 Encounters:  01/14/22 244 lb (110.7 kg)  01/13/22 247 lb 8 oz (112.3 kg)  12/22/21 247 lb 5.7 oz (112.2 kg)     GENERAL:alert, no distress and comfortable SKIN: skin color normal, no rashes or significant lesions EYES: normal, Conjunctiva are pink and non-injected, sclera clear  NEURO: alert & oriented x 3 with fluent speech  LABORATORY DATA:  I have reviewed the data as listed    Latest Ref Rng & Units 01/24/2022    7:47 AM 01/13/2022    7:44 AM 12/23/2021    4:17 PM  CBC  WBC 4.0 - 10.5 K/uL 7.4  6.6    Hemoglobin 12.0 - 15.0 g/dL 12.4  12.4  11.9   Hematocrit 36.0 - 46.0 % 37.0  37.5  38.0   Platelets 150 -  400 K/uL 217  285          Latest Ref Rng & Units 01/24/2022    7:47 AM 01/13/2022    7:44 AM 12/23/2021    4:52 AM  CMP  Glucose 70 - 99 mg/dL 170  136  168   BUN 8 - 23 mg/dL 24  14  11   Creatinine 0.44 - 1.00 mg/dL 0.81  0.70  0.70   Sodium 135 - 145 mmol/L 141  142  140   Potassium 3.5 - 5.1 mmol/L 4.0  3.8  4.3   Chloride 98 - 111 mmol/L 107  108  110   CO2 22 - 32 mmol/L 26  26  23   Calcium 8.9 - 10.3 mg/dL 9.1  9.1  8.5   Total Protein 6.5 - 8.1 g/dL 6.8  6.5    Total Bilirubin 0.3 - 1.2 mg/dL 0.9  1.0    Alkaline Phos 38 - 126 U/L 71  67    AST 15 - 41 U/L 18  21    ALT 0 - 44 U/L 14  15        RADIOGRAPHIC STUDIES: I have personally reviewed the radiological images as listed and agreed with the findings in the report. No results found.    Orders Placed This Encounter  Procedures   CBC with Differential (Cancer Center Only)    Standing Status:   Future    Standing Expiration Date:   02/22/2023   CMP (Cancer Center only)    Standing Status:   Future    Standing Expiration Date:   02/22/2023   CBC with Differential (Cancer Center Only)    Standing Status:   Future    Standing Expiration Date:   03/08/2023   CMP (Cancer Center only)    Standing Status:   Future    Standing Expiration Date:   03/08/2023   All questions were answered. The patient knows to call the clinic with any problems, questions or concerns. No barriers to learning was   detected. The total time spent in the appointment was 30 minutes.     Truitt Merle, MD 01/24/2022   I, Wilburn Mylar, am acting as scribe for Truitt Merle, MD.   I have reviewed the above documentation for accuracy and completeness, and I agree with the above.

## 2022-01-26 ENCOUNTER — Other Ambulatory Visit: Payer: Self-pay

## 2022-01-26 ENCOUNTER — Inpatient Hospital Stay: Payer: Medicare Other

## 2022-01-26 VITALS — BP 132/55 | HR 73 | Temp 98.3°F | Resp 18

## 2022-01-26 DIAGNOSIS — C182 Malignant neoplasm of ascending colon: Secondary | ICD-10-CM

## 2022-01-26 DIAGNOSIS — C180 Malignant neoplasm of cecum: Secondary | ICD-10-CM | POA: Diagnosis not present

## 2022-01-26 MED ORDER — HEPARIN SOD (PORK) LOCK FLUSH 100 UNIT/ML IV SOLN
500.0000 [IU] | Freq: Once | INTRAVENOUS | Status: AC | PRN
  Administered 2022-01-26: 500 [IU]

## 2022-01-26 MED ORDER — SODIUM CHLORIDE 0.9% FLUSH
10.0000 mL | INTRAVENOUS | Status: DC | PRN
  Administered 2022-01-26: 10 mL

## 2022-01-30 ENCOUNTER — Encounter: Payer: Self-pay | Admitting: Hematology

## 2022-02-04 ENCOUNTER — Encounter: Payer: Self-pay | Admitting: Hematology

## 2022-02-04 ENCOUNTER — Other Ambulatory Visit: Payer: Self-pay

## 2022-02-04 ENCOUNTER — Inpatient Hospital Stay: Payer: Medicare Other

## 2022-02-04 ENCOUNTER — Inpatient Hospital Stay: Payer: Medicare Other | Attending: Hematology | Admitting: Hematology

## 2022-02-04 VITALS — BP 156/69 | HR 70 | Temp 98.2°F | Resp 17 | Wt 248.2 lb

## 2022-02-04 DIAGNOSIS — C7963 Secondary malignant neoplasm of bilateral ovaries: Secondary | ICD-10-CM | POA: Diagnosis not present

## 2022-02-04 DIAGNOSIS — C182 Malignant neoplasm of ascending colon: Secondary | ICD-10-CM

## 2022-02-04 DIAGNOSIS — C786 Secondary malignant neoplasm of retroperitoneum and peritoneum: Secondary | ICD-10-CM | POA: Insufficient documentation

## 2022-02-04 DIAGNOSIS — Z86000 Personal history of in-situ neoplasm of breast: Secondary | ICD-10-CM | POA: Diagnosis not present

## 2022-02-04 DIAGNOSIS — I10 Essential (primary) hypertension: Secondary | ICD-10-CM | POA: Diagnosis not present

## 2022-02-04 DIAGNOSIS — R918 Other nonspecific abnormal finding of lung field: Secondary | ICD-10-CM | POA: Diagnosis not present

## 2022-02-04 DIAGNOSIS — Z170 Estrogen receptor positive status [ER+]: Secondary | ICD-10-CM

## 2022-02-04 DIAGNOSIS — C180 Malignant neoplasm of cecum: Secondary | ICD-10-CM | POA: Diagnosis not present

## 2022-02-04 DIAGNOSIS — R0609 Other forms of dyspnea: Secondary | ICD-10-CM | POA: Diagnosis not present

## 2022-02-04 DIAGNOSIS — Z5111 Encounter for antineoplastic chemotherapy: Secondary | ICD-10-CM | POA: Diagnosis not present

## 2022-02-04 DIAGNOSIS — D6481 Anemia due to antineoplastic chemotherapy: Secondary | ICD-10-CM | POA: Insufficient documentation

## 2022-02-04 DIAGNOSIS — C50919 Malignant neoplasm of unspecified site of unspecified female breast: Secondary | ICD-10-CM

## 2022-02-04 DIAGNOSIS — Z95828 Presence of other vascular implants and grafts: Secondary | ICD-10-CM

## 2022-02-04 DIAGNOSIS — Z90710 Acquired absence of both cervix and uterus: Secondary | ICD-10-CM | POA: Diagnosis not present

## 2022-02-04 DIAGNOSIS — E1140 Type 2 diabetes mellitus with diabetic neuropathy, unspecified: Secondary | ICD-10-CM | POA: Diagnosis not present

## 2022-02-04 DIAGNOSIS — C779 Secondary and unspecified malignant neoplasm of lymph node, unspecified: Secondary | ICD-10-CM | POA: Insufficient documentation

## 2022-02-04 LAB — COMPREHENSIVE METABOLIC PANEL
ALT: 25 U/L (ref 0–44)
AST: 24 U/L (ref 15–41)
Albumin: 3.9 g/dL (ref 3.5–5.0)
Alkaline Phosphatase: 79 U/L (ref 38–126)
Anion gap: 6 (ref 5–15)
BUN: 15 mg/dL (ref 8–23)
CO2: 27 mmol/L (ref 22–32)
Calcium: 9.1 mg/dL (ref 8.9–10.3)
Chloride: 110 mmol/L (ref 98–111)
Creatinine, Ser: 0.79 mg/dL (ref 0.44–1.00)
GFR, Estimated: >60 mL/min (ref >60)
Glucose, Bld: 159 mg/dL — ABNORMAL HIGH (ref 70–99)
Potassium: 3.6 mmol/L (ref 3.5–5.1)
Sodium: 143 mmol/L (ref 135–145)
Total Bilirubin: 1.2 mg/dL (ref 0.3–1.2)
Total Protein: 6.6 g/dL (ref 6.5–8.1)

## 2022-02-04 LAB — CBC WITH DIFFERENTIAL/PLATELET
Abs Immature Granulocytes: 0.01 10*3/uL (ref 0.00–0.07)
Basophils Absolute: 0 10*3/uL (ref 0.0–0.1)
Basophils Relative: 0 %
Eosinophils Absolute: 0.1 10*3/uL (ref 0.0–0.5)
Eosinophils Relative: 2 %
HCT: 33.5 % — ABNORMAL LOW (ref 36.0–46.0)
Hemoglobin: 11.2 g/dL — ABNORMAL LOW (ref 12.0–15.0)
Immature Granulocytes: 0 %
Lymphocytes Relative: 47 %
Lymphs Abs: 2.5 10*3/uL (ref 0.7–4.0)
MCH: 32.7 pg (ref 26.0–34.0)
MCHC: 33.4 g/dL (ref 30.0–36.0)
MCV: 98 fL (ref 80.0–100.0)
Monocytes Absolute: 0.4 10*3/uL (ref 0.1–1.0)
Monocytes Relative: 8 %
Neutro Abs: 2.2 10*3/uL (ref 1.7–7.7)
Neutrophils Relative %: 43 %
Platelets: 223 10*3/uL (ref 150–400)
RBC: 3.42 MIL/uL — ABNORMAL LOW (ref 3.87–5.11)
RDW: 12.9 % (ref 11.5–15.5)
WBC: 5.2 10*3/uL (ref 4.0–10.5)
nRBC: 0 % (ref 0.0–0.2)

## 2022-02-04 MED ORDER — SODIUM CHLORIDE 0.9% FLUSH
10.0000 mL | INTRAVENOUS | Status: DC | PRN
  Administered 2022-02-04: 10 mL

## 2022-02-04 MED ORDER — HEPARIN SOD (PORK) LOCK FLUSH 100 UNIT/ML IV SOLN
500.0000 [IU] | Freq: Once | INTRAVENOUS | Status: AC | PRN
  Administered 2022-02-04: 500 [IU]

## 2022-02-04 NOTE — Progress Notes (Signed)
[place]   [phone] [phone]   Clinic Follow up Note   Patient Care Team: [name], MD as PCP - General (Family Medicine) Rolm Bookbinder, MD as Consulting Physician (General Surgery) Truitt Merle, MD as Consulting Physician (Hematology) Leighton Ruff, MD as Consulting Physician (General Surgery) Nicholas Lose, MD as Consulting Physician (Hematology and Oncology)  Date of Service:  02/04/2022  CHIEF COMPLAINT: f/u of metastatic colon cancer, h/o DCIS  CURRENT THERAPY:  FOLFOX, q14d, restarted 01/24/22  ASSESSMENT & PLAN:  Kim Castro is a 76 y.o. female with   1. Right colon cancer, pT3N2aM0 stage IIB, MSS, metastatic to b/l ovaries and peritoneum, KRAS mutation G12V (+)  -diagnosed in 08/2019 with cecal mass, biopsy showed moderately differentiated adenocarcinoma. CT chest 10/16/19 showed nonspecific small lung nodules. -s/p colon surgery with Dr Marcello Moores on 11/01/19. Path showed overall Stage IIIB cancer.  -She received 6 months of FOLFOX to reduce her risk of recurrence, completed 05/2020.  -surveillance colonoscopy on 02/10/21 under Dr. Carlean Purl showed only diverticulosis. Repeat due in 01/2024. -restaging CT CAP 08/26/21 was NED. -restaging PET scan on 11/10/21 showed hypermetabolism to enlarged left ovary, no other evidence of metastatic disease. -s/p total hysterectomy on 12/22/21 with Dr. Berline Lopes. Path revealed metastatic moderately differentiated colonic adenocarcinoma involving both ovaries. Peritoneal deposits were also positive for metastatic mucinous adenocarcinoma, consistent with colorectal primary. -her CEA as also risen, consistent with worsening disease. Most recently up to 22.67 on 01/13/22. -[employer] showed KRAS mutation. She is not eligible for any targeted therapy.  -she restarted FOLFOX today, 01/24/22. She has mild neuropathy, I will slightly reduce oxaliplatin dose. -I plan to refer her to Dr. Crisoforo Oxford at [facility] for  consideration of HIPEC surgery. Because of this, I do not plan to give her bevacizumab. She wants to check with her insurance first  -labs reviewed, overall WNL except for some mild anemia from chemo, hgb 11.2. Adequate to proceed with FOLFOX as scheduled.   2. Neuropathy, secondary to Oxaliplatin G1 -S/p C7 she started having numbness in hands, L>R which become prolonged with tingling s/p C10.  -Oxaliplatin dose reduced and ultimately held with C11-12.  -she is on Lyrica and B vit complex, mild and stable   3. H/o left breast DCIS, G2, ER/PR+ -Dx in 05/2014, s/p left lumpectomy with Dr Excell Seltzer, adjuvant RT with Dr Pablo Ledger, and Anastrozole 08/2014 - 01/13/20 under Dr Lindi Adie.  -DEXA 09/11/20 T score +1.2 normal.  -most recent mammogram on 10/04/21 was negative. -plan to start screening breast MRI in 04/2022.     PLAN:  -proceed with C2 FOLFOX at same dose on Monday 10/9 -lab, flush, f/u, and FOLFOX every 2 weeks             -she prefers Monday appointments   No problem-specific Assessment & Plan notes found for this encounter.   SUMMARY OF ONCOLOGIC HISTORY: Oncology History Overview Note   Cancer Staging  Malignant neoplasm of female breast [facility]) Staging form: Breast, AJCC 7th Edition - Clinical stage from 06/11/2014: Stage 0 (Tis (DCIS), N0, M0) - Unsigned Staged by: Pathologist and managing physician Laterality: Left Estrogen receptor status: Positive Progesterone receptor status: Positive Stage used in treatment planning: Yes National guidelines used in treatment planning: Yes Type of national guideline used in treatment planning: NCCN - Pathologic stage from 07/03/2014: Stage Unknown (Tis (DCIS), NX, cM0) - Signed by Enid Cutter, MD on 07/10/2014 Staged by: Pathologist Laterality: Left Estrogen receptor status: Positive Progesterone receptor status: Positive  Stage used in treatment planning: Yes National guidelines used in treatment planning: Yes Type of national guideline  used in treatment planning: NCCN Staging comments: Staged on final lumpectomy specimen by Dr. Donato Heinz.  Right colon cancer Staging form: Colon and Rectum, AJCC 8th Edition - Pathologic stage from 11/01/2019: Stage IIIB (pT3, pN2a, cM0) - Signed by Truitt Merle, MD on 11/29/2019 Stage prefix: Initial diagnosis Histologic grading system: 4 grade system Histologic grade (G): G2 Residual tumor (R): R0 - None Tumor deposits (TD): Absent Perineural invasion (PNI): Absent Microsatellite instability (MSI): Stable KRAS mutation: Unknown NRAS mutation: Unknown BRAF mutation: Unknown     Malignant neoplasm of female breast ([place])  05/29/2014 Initial Biopsy   Left breast needle core biopsy: Grade 2, DCIS with calcs. ER+ (100%), PR+ (96%).    06/04/2014 Initial Diagnosis   Left breast DCIS with calcifications, ER 100%, PR 96%   06/10/2014 Breast MRI   Left breast: 2.4 x 1.3 x 1.1 cm area of patchy non-mass enhancement upper outer quadrant includes postbiopsy seroma; Right breast: 1.2 cm previously biopsied stable benign fibroadenoma   06/12/2014 Procedure   Genetic counseling/testing: Identified 1 VUS on CHEK2 gene. Remainder of 17 gene panel tested negative and included: ATM, BARD1, BRCA 1/2, BRIP1, CDH1, CHEK2, EPCAM, MLH1, MSH2, MSH6, NBN, NF1, PALB2, PTEN, RAD50, RAD51C, RAD51D, STK11, and TP53.    07/01/2014 Surgery   Left breast lumpectomy ([facility]): Grade 1, DCIS, spanning 2.3 cm, 1 mm margin, ER 100%, PR 96%   07/31/2014 - 08/28/2014 Radiation Therapy   Adjuvant RT completed Pablo Ledger). Left breast: Total dose 42.5 Gy over 17 fractions. Left breast boost: Total dose 7.5 Gy over 3 fractions.    09/14/2014 - 01/13/2020 Anti-estrogen oral therapy   Anastrazole $RemoveBefo'1mg'pfqiONEargU$  daily. Planned duration of treatment: 5 years [place]). Completed in 01/2020.    09/25/2014 Survivorship   Survivorship Care Plan given to patient and reviewed with her in person.    03/02/2021 Imaging   CT CAP  IMPRESSION: 1. No findings  of active/recurrent malignancy. Partial right hemicolectomy. 2. Endometrial stripe remains mildly thickened, but endometrial biopsy in May was negative for malignancy. 3. Progressive endplate sclerosis and endplate irregularity at T2-3, probably due to degenerative endplate findings. If the has referable upper thoracic pain/symptoms then thoracic spine MRI could be used for further workup. 4. Other imaging findings of potential clinical significance: Mild cardiomegaly. Aortic Atherosclerosis (ICD10-I70.0). Mild mitral valve calcification. Postoperative findings in the left breast with adjacent radiation port anteriorly in the left upper lobe. Tiny pulmonary nodules in the left lower lobe are unchanged from earliest available comparison of 10/17/2019 and probably benign although may merit surveillance. Multilevel lumbar impingement. Mild pelvic floor laxity.   Right colon cancer  09/19/2019 Imaging   CT AP W contrast 09/19/19  IMPRESSION Fullness in the cecum, cannot exclude a mass. No evidence for metastatic disease is identified.    09/24/2019 Procedure   Colonoscopy by Dr Eber Jones 09/24/19 IMPRESSION 1. The colon was redundant  2. Mild diverticulosis was noted through the entire examined colon 3. Single 82mm polyp was found in the ascending colon; polypectomy was performed using snare cautery and biopsy forceps 4. Mild diverticulosis was notes in the descending colon and sigmoid colon.  5. Single polyp was found in the sigmoid colon, polypectomy was performed with cold forceps.  6. Single polyp was found in the rectosigmoid colon; polypectomy was performed with cold snare  7. Small internal hemorrhoids  8. Large mass was found at the cecum; multiple biopsies  of the area were performed using cold forceps; injection (tattooing) was performed distal to the mass.    09/24/2019 Initial Biopsy   INTERPRETATION AND DIAGNOSIS:  A. Cecum, biopsy:  Invasive moderately differentiated adenocarcinoma.   see comment  B. Polyp @ ascending colon, polypectomy:  Tubular Adenoma  C. Polyp @ sigmoid colon Polypectomy:  hyperplastic polyp.  D. Polyp @ rectosigmoid colon, Polypectomy:  Hyperplastic Polyp      10/16/2019 Imaging   CT Chest IMPRESSION: 1. Multiple small pulmonary nodules measuring 5 mm or less in size in the lungs. These are nonspecific and are typically considered statistically likely benign. However, given the patient's history of primary malignancy, close attention on follow-up studies is recommended to ensure stability. 2. Aortic atherosclerosis, in addition to right coronary artery disease. Assessment for potential risk factor modification, dietary therapy or pharmacologic therapy may be warranted, if clinically indicated. 3. There are calcifications of the aortic valve and mitral annulus. Echocardiographic correlation for evaluation of potential valvular dysfunction may be warranted if clinically indicated. 4. Small hiatal hernia.   Aortic Atherosclerosis (ICD10-I70.0).   11/01/2019 Initial Diagnosis   Colon cancer ([place])   11/01/2019 Surgery   LAPAROSCOPIC PARTIAL COLECTOMY by Dr Marcello Moores and Dr Johney Maine   11/01/2019 Pathology Results   FINAL MICROSCOPIC DIAGNOSIS:   A. COLON, PROXIMAL RIGHT, COLECTOMY:  - Invasive colonic adenocarcinoma, 5 cm.  - Tumor invades through the muscularis propria into pericolonic tissues.   - Margins of resection are not involved.  - Metastatic carcinoma in (5) of (13) lymph nodes.  - See oncology table.    MSI Stable  Mismatch repair normal  MLH1 - Preserved nuclear expression (greater 50% tumor expression) MSH2 - Preserved nuclear expression (greater 50% tumor expression) MSH6 - Preserved nuclear expression (greater 50% tumor expression) PMS2 - Preserved nuclear expression (greater 50% tumor expression)   11/01/2019 Cancer Staging   Staging form: Colon and Rectum, AJCC 8th Edition - Pathologic stage from 11/01/2019: Stage IIIB  (pT3, pN2a, cM0) - Signed by Truitt Merle, MD on 11/29/2019   12/10/2019 Procedure   PAC placed 12/10/19   12/17/2019 - 06/01/2020 Chemotherapy   FOLFOX q2weeks starting in 2 weeks starting 12/17/19. Held 01/27/20-02/10/20 due to b/l PE. Oxaliplatin held C11-12 due to neuropathy. Completed on 06/01/20   03/02/2021 Imaging   CT CAP  IMPRESSION: 1. No findings of active/recurrent malignancy. Partial right hemicolectomy. 2. Endometrial stripe remains mildly thickened, but endometrial biopsy in May was negative for malignancy. 3. Progressive endplate sclerosis and endplate irregularity at T2-3, probably due to degenerative endplate findings. If the has referable upper thoracic pain/symptoms then thoracic spine MRI could be used for further workup. 4. Other imaging findings of potential clinical significance: Mild cardiomegaly. Aortic Atherosclerosis (ICD10-I70.0). Mild mitral valve calcification. Postoperative findings in the left breast with adjacent radiation port anteriorly in the left upper lobe. Tiny pulmonary nodules in the left lower lobe are unchanged from earliest available comparison of 10/17/2019 and probably benign although may merit surveillance. Multilevel lumbar impingement. Mild pelvic floor laxity.   08/26/2021 Imaging   EXAM: CT CHEST, ABDOMEN, AND PELVIS WITH CONTRAST  IMPRESSION: 1. Stable examination without new or progressive findings to suggest local recurrence or metastatic disease within the chest, abdomen, or pelvis. 2. Hepatomegaly with hepatic steatosis. 3. Sigmoid colonic diverticulosis without findings of acute diverticulitis. 4. Similar prominent endplate sclerosis and irregularity at T2-T3 is most consistent with Modic type endplate degenerative changes. However, if patient has referable upper thoracic pain consider further workup with  thoracic spine MRI. 5. Similar thickening of the endometrial stripe measuring up to 8 mm, which was previously biopsied with  results negative for malignancy. 6. Aortic Atherosclerosis (ICD10-I70.0).   11/10/2021 PET scan   IMPRESSION: 1. LEFT ovary is increased in size and is intensely hypermetabolic. While physiologic hypermetabolic ovarian tissue is not uncommon, the enlargement and asymmetric activity warrants further evaluation. Consider contrast pelvic MRI vs tissue sampling. 2. No evidence of metastatic colorectal carcinoma otherwise. 3. Post RIGHT hemicolectomy anatomy. 4. Evidence of radiation change in the LEFT upper lobe (remote breast cancer).     12/22/2021 Relapse/Recurrence    FINAL MICROSCOPIC DIAGNOSIS:   A. LEFT OVARY AND FALLOPIAN TUBE, SALPINGO OOPHORECTOMY:  - Metastatic moderately differentiated colonic adenocarcinoma involving left ovary  - Focal ovarian stromal calcification  - Segment of benign fallopian tube   B. UTERUS WITH RIGHT FALLOPIAN TUBE AND OVARY, HYSTERECTOMY AND SALPINGO-OOPHORECTOMY:  - Metastatic moderately differentiated colonic adenocarcinoma involving right ovary  - Focal invasive extensive adenomyosis  - Benign endometrial polyps  - Benign proliferative phase endometrium  - Hydrosalpinx of right fallopian tube  - Focal ovarian stromal calcification   C. PERITONEAL DEPOSITS, ANTERIOR CUL DE SAC, BIOPSY:  - Metastatic mucinous adenocarcinoma, consistent with colorectal primary    COMMENT:  Immunohistochemical stains show that the tumor cells are positive for CK20 and CDX2 while they are negative for CK7 and PAX8, consistent with above interpretation.    01/24/2022 -  Chemotherapy   Patient is on Treatment Plan : COLORECTAL FOLFOX q14d x 3 months        INTERVAL HISTORY:  Kim Castro is here for a follow up of metastatic colon cancer. She was last seen by me on 01/24/22. She presents to the clinic alone. She reports she is tolerating treatment well. She notes she had some fatigue and cold sensitivity but nothing unmanageable. She reports she developed a  pain to her right side (points to bottom of or just below her ribcage) following pump d/c that relieved with tylenol but recurred for several days after.   All other systems were reviewed with the patient and are negative.  MEDICAL HISTORY:  Past Medical History:  Diagnosis Date   Aortic atherosclerosis (HCC)    Arthritis    feet, lower back   Basal cell carcinoma    arm   Breast cancer of upper-outer quadrant of left female breast ([place]) 06/04/2014   Cataract    immature on the left   Colon cancer ([place]) 08/2019   Diabetes mellitus without complication (HCC)    Diverticulosis    Dizziness    > 38yrs ago;took Antivert    Family history of anesthesia complication    sister slow to wake up with anesthesia   Family history of breast cancer    Family history of colon cancer    Family history of uterine cancer    GERD (gastroesophageal reflux disease)    takes occasional TUMs   History of bronchitis    > 26yrs ago   History of colon polyps    History of hiatal hernia    Small noted on CT   History of pulmonary embolus (PE)    Hypertension    takes Losartan daily and HCTZ   Iron deficiency anemia    Joint pain    Numbness    to toes on each foot   Peripheral neuropathy    feet and toes   Personal history of radiation therapy    Pulmonary nodules  Noted on CT   Radiation 07/31/14-08/28/14   Left Breast 20 fxs   Seasonal allergies    takes Claritin prn   Urinary frequency    Vitamin D deficiency    takes VIt D daily    SURGICAL HISTORY: Past Surgical History:  Procedure Laterality Date   BREAST BIOPSY Bilateral    BREAST LUMPECTOMY Left    BREAST LUMPECTOMY WITH RADIOACTIVE SEED LOCALIZATION Left 07/01/2014   Procedure: LEFT BREAST LUMPECTOMY WITH RADIOACTIVE SEED LOCALIZATION;  Surgeon: Excell Seltzer, MD;  Location: [facility];  Service: General;  Laterality: Left;   CATARACT EXTRACTION Right    COLONOSCOPY  09/24/2019   [facility]   LAPAROSCOPIC  PARTIAL COLECTOMY N/A 11/01/2019   Procedure: LAPAROSCOPIC PARTIAL COLECTOMY;  Surgeon: Leighton Ruff, MD;  Location: [facility];  Service: General;  Laterality: N/A;   POLYPECTOMY     PORTACATH PLACEMENT N/A 12/10/2019   Procedure: INSERTION PORT-A-CATH ULTRASOUND GUIDED IN RIGHT IJ;  Surgeon: Leighton Ruff, MD;  Location: [facility];  Service: General;  Laterality: N/A;   ROBOTIC ASSISTED TOTAL HYSTERECTOMY Bilateral 12/22/2021   Procedure: XI ROBOTIC ASSISTED TOTAL HYSTERECTOMY WITH BILATERAL SALPINGO OOPHORECTOMY;  Surgeon: [name], MD;  Location: [facility];  Service: Gynecology;  Laterality: Bilateral;   TOTAL KNEE ARTHROPLASTY Left 10/24/2012   Procedure: TOTAL KNEE ARTHROPLASTY;  Surgeon: Kerin Salen, MD;  Location: [facility];  Service: Orthopedics;  Laterality: Left;   TOTAL KNEE ARTHROPLASTY Right 01/09/2013   Procedure: TOTAL KNEE ARTHROPLASTY;  Surgeon: Kerin Salen, MD;  Location: [facility];  Service: Orthopedics;  Laterality: Right;   TUBAL LIGATION      I have reviewed the social history and family history with the patient and they are unchanged from previous note.  ALLERGIES:  is allergic to oxycodone.  MEDICATIONS:  Current Outpatient Medications  Medication Sig Dispense Refill   acetaminophen (TYLENOL) 325 MG tablet Take 650 mg by mouth every 6 (six) hours as needed for moderate pain.     b complex vitamins capsule Take 1 capsule by mouth daily.     Cholecalciferol (VITAMIN D) 2000 UNITS CAPS Take 2,000 Units by mouth daily.      lidocaine-prilocaine (EMLA) cream Apply to affected area once 30 g 3   loratadine (CLARITIN) 10 MG tablet Take 10 mg by mouth daily as needed for allergies.     losartan (COZAAR) 25 MG tablet Take 1 tablet (25 mg total) by mouth daily. 90 tablet 3   metFORMIN (GLUCOPHAGE) 500 MG tablet Take 1 tablet (500 mg total) by mouth in the morning and at bedtime. 180 tablet 3   Multiple Vitamins-Minerals (MULTIVITAMIN WITH MINERALS) tablet Take 1 tablet by  mouth daily.     omeprazole (PRILOSEC) 20 MG capsule Take 20 mg by mouth daily before breakfast.      ondansetron (ZOFRAN) 8 MG tablet Take 1 tablet (8 mg total) by mouth every 8 (eight) hours as needed for nausea or vomiting. Start on the third day after chemotherapy. 30 tablet 1   pravastatin (PRAVACHOL) 10 MG tablet Take 1 tablet (10 mg total) by mouth daily. 90 tablet 3   pregabalin (LYRICA) 25 MG capsule Take 1 capsule by mouth once daily 30 capsule 0   prochlorperazine (COMPAZINE) 10 MG tablet Take 1 tablet (10 mg total) by mouth every 6 (six) hours as needed for nausea or vomiting. 30 tablet 1   No current facility-administered medications for this visit.    PHYSICAL EXAMINATION: ECOG PERFORMANCE STATUS:  1 - Symptomatic but completely ambulatory  Vitals:   02/04/22 1319  BP: (!) 156/69  Pulse: 70  Resp: 17  Temp: 98.2 F (36.8 C)  SpO2: 95%   Wt Readings from Last 3 Encounters:  02/04/22 248 lb 3 oz (112.6 kg)  01/14/22 244 lb (110.7 kg)  01/13/22 247 lb 8 oz (112.3 kg)     GENERAL:alert, no distress and comfortable SKIN: skin color normal, no rashes or significant lesions EYES: normal, Conjunctiva are pink and non-injected, sclera clear  NEURO: alert & oriented x 3 with fluent speech  LABORATORY DATA:  I have reviewed the data as listed    Latest Ref Rng & Units 02/04/2022   12:56 PM 01/24/2022    7:47 AM 01/13/2022    7:44 AM  CBC  WBC 4.0 - 10.5 K/uL 5.2  7.4  6.6   Hemoglobin 12.0 - 15.0 g/dL 11.2  12.4  12.4   Hematocrit 36.0 - 46.0 % 33.5  37.0  37.5   Platelets 150 - 400 K/uL 223  217  285         Latest Ref Rng & Units 02/04/2022   12:56 PM 01/24/2022    7:47 AM 01/13/2022    7:44 AM  CMP  Glucose 70 - 99 mg/dL 159  170  136   BUN 8 - 23 mg/dL $Remove'15  24  14   'ROtHhFh$ Creatinine 0.44 - 1.00 mg/dL 0.79  0.81  0.70   Sodium 135 - 145 mmol/L 143  141  142   Potassium 3.5 - 5.1 mmol/L 3.6  4.0  3.8   Chloride 98 - 111 mmol/L 110  107  108   CO2 22 - 32 mmol/L $RemoveB'27   26  26   'cGIxvzjL$ Calcium 8.9 - 10.3 mg/dL 9.1  9.1  9.1   Total Protein 6.5 - 8.1 g/dL 6.6  6.8  6.5   Total Bilirubin 0.3 - 1.2 mg/dL 1.2  0.9  1.0   Alkaline Phos 38 - 126 U/L 79  71  67   AST 15 - 41 U/L $Remo'24  18  21   'vVVEd$ ALT 0 - 44 U/L $Remo'25  14  15       'ZZhin$ RADIOGRAPHIC STUDIES: I have personally reviewed the radiological images as listed and agreed with the findings in the report. No results found.    No orders of the defined types were placed in this encounter.  All questions were answered. The patient knows to call the clinic with any problems, questions or concerns. No barriers to learning was detected. The total time spent in the appointment was 30 minutes.     Truitt Merle, MD 02/04/2022   I, Wilburn Mylar, am acting as scribe for Truitt Merle, MD.   I have reviewed the above documentation for accuracy and completeness, and I agree with the above.

## 2022-02-07 MED FILL — Dexamethasone Sodium Phosphate Inj 100 MG/10ML: INTRAMUSCULAR | Qty: 1 | Status: AC

## 2022-02-08 ENCOUNTER — Inpatient Hospital Stay: Payer: Medicare Other

## 2022-02-08 ENCOUNTER — Encounter: Payer: Self-pay | Admitting: Hematology

## 2022-02-08 ENCOUNTER — Other Ambulatory Visit: Payer: Self-pay

## 2022-02-08 VITALS — BP 141/58 | HR 66 | Temp 97.9°F | Resp 18 | Wt 250.2 lb

## 2022-02-08 DIAGNOSIS — D6481 Anemia due to antineoplastic chemotherapy: Secondary | ICD-10-CM | POA: Diagnosis not present

## 2022-02-08 DIAGNOSIS — R918 Other nonspecific abnormal finding of lung field: Secondary | ICD-10-CM | POA: Diagnosis not present

## 2022-02-08 DIAGNOSIS — C182 Malignant neoplasm of ascending colon: Secondary | ICD-10-CM | POA: Diagnosis not present

## 2022-02-08 DIAGNOSIS — C779 Secondary and unspecified malignant neoplasm of lymph node, unspecified: Secondary | ICD-10-CM | POA: Diagnosis not present

## 2022-02-08 DIAGNOSIS — H25812 Combined forms of age-related cataract, left eye: Secondary | ICD-10-CM | POA: Diagnosis not present

## 2022-02-08 DIAGNOSIS — C180 Malignant neoplasm of cecum: Secondary | ICD-10-CM | POA: Diagnosis not present

## 2022-02-08 DIAGNOSIS — C786 Secondary malignant neoplasm of retroperitoneum and peritoneum: Secondary | ICD-10-CM | POA: Diagnosis not present

## 2022-02-08 DIAGNOSIS — R0609 Other forms of dyspnea: Secondary | ICD-10-CM | POA: Diagnosis not present

## 2022-02-08 DIAGNOSIS — Z90710 Acquired absence of both cervix and uterus: Secondary | ICD-10-CM | POA: Diagnosis not present

## 2022-02-08 DIAGNOSIS — E1140 Type 2 diabetes mellitus with diabetic neuropathy, unspecified: Secondary | ICD-10-CM | POA: Diagnosis not present

## 2022-02-08 DIAGNOSIS — C7963 Secondary malignant neoplasm of bilateral ovaries: Secondary | ICD-10-CM | POA: Diagnosis not present

## 2022-02-08 DIAGNOSIS — Z5111 Encounter for antineoplastic chemotherapy: Secondary | ICD-10-CM | POA: Diagnosis not present

## 2022-02-08 DIAGNOSIS — I10 Essential (primary) hypertension: Secondary | ICD-10-CM | POA: Diagnosis not present

## 2022-02-08 DIAGNOSIS — Z86000 Personal history of in-situ neoplasm of breast: Secondary | ICD-10-CM | POA: Diagnosis not present

## 2022-02-08 MED ORDER — DEXTROSE 5 % IV SOLN: Freq: Once | INTRAVENOUS | Status: AC

## 2022-02-08 MED ORDER — HEPARIN SOD (PORK) LOCK FLUSH 100 UNIT/ML IV SOLN: 500.0000 [IU] | Freq: Once | INTRAVENOUS | Status: DC | PRN

## 2022-02-08 MED ORDER — SODIUM CHLORIDE 0.9% FLUSH: 10.0000 mL | INTRAVENOUS | Status: DC | PRN

## 2022-02-08 MED ORDER — PALONOSETRON HCL INJECTION 0.25 MG/5ML
0.2500 mg | Freq: Once | INTRAVENOUS | Status: AC
  Administered 2022-02-08: 0.25 mg via INTRAVENOUS
  Filled 2022-02-08: qty 5

## 2022-02-08 MED ORDER — SODIUM CHLORIDE 0.9 % IV SOLN
10.0000 mg | Freq: Once | INTRAVENOUS | Status: AC
  Administered 2022-02-08: 10 mg via INTRAVENOUS
  Filled 2022-02-08: qty 10

## 2022-02-08 MED ORDER — LEUCOVORIN CALCIUM INJECTION 350 MG
400.0000 mg/m2 | Freq: Once | INTRAVENOUS | Status: AC
  Administered 2022-02-08: 908 mg via INTRAVENOUS
  Filled 2022-02-08: qty 45.4

## 2022-02-08 MED ORDER — FLUOROURACIL CHEMO INJECTION 2.5 GM/50ML
400.0000 mg/m2 | Freq: Once | INTRAVENOUS | Status: AC
  Administered 2022-02-08: 900 mg via INTRAVENOUS
  Filled 2022-02-08: qty 18

## 2022-02-08 MED ORDER — SODIUM CHLORIDE 0.9 % IV SOLN
2400.0000 mg/m2 | INTRAVENOUS | Status: DC
  Administered 2022-02-08: 5450 mg via INTRAVENOUS
  Filled 2022-02-08: qty 109

## 2022-02-08 MED ORDER — OXALIPLATIN CHEMO INJECTION 100 MG/20ML
70.0000 mg/m2 | Freq: Once | INTRAVENOUS | Status: AC
  Administered 2022-02-08: 160 mg via INTRAVENOUS
  Filled 2022-02-08: qty 32

## 2022-02-08 NOTE — Patient Instructions (Signed)
[facility] MEDICAL ONCOLOGY  Discharge Instructions: Thank you for choosing [facility] to provide your oncology and hematology care.   If you have a lab appointment with the Cancer Center, please go directly to the Cancer Center and check in at the registration area.   Wear comfortable clothing and clothing appropriate for easy access to any Portacath or PICC line.   We strive to give you quality time with your provider. You may need to reschedule your appointment if you arrive late (15 or more minutes).  Arriving late affects you and other patients whose appointments are after yours.  Also, if you miss three or more appointments without notifying the office, you may be dismissed from the clinic at the provider's discretion.      For prescription refill requests, have your pharmacy contact our office and allow 72 hours for refills to be completed.    Today you received the following chemotherapy and/or immunotherapy agents: Oxaliplatin, Leucovorin, Fluorouracil.       To help prevent nausea and vomiting after your treatment, we encourage you to take your nausea medication as directed.  BELOW ARE SYMPTOMS THAT SHOULD BE REPORTED IMMEDIATELY: *FEVER GREATER THAN 100.4 F (38 C) OR HIGHER *CHILLS OR SWEATING *NAUSEA AND VOMITING THAT IS NOT CONTROLLED WITH YOUR NAUSEA MEDICATION *UNUSUAL SHORTNESS OF BREATH *UNUSUAL BRUISING OR BLEEDING *URINARY PROBLEMS (pain or burning when urinating, or frequent urination) *BOWEL PROBLEMS (unusual diarrhea, constipation, pain near the anus) TENDERNESS IN MOUTH AND THROAT WITH OR WITHOUT PRESENCE OF ULCERS (sore throat, sores in mouth, or a toothache) UNUSUAL RASH, SWELLING OR PAIN  UNUSUAL VAGINAL DISCHARGE OR ITCHING   Items with * indicate a potential emergency and should be followed up as soon as possible or go to the Emergency Department if any problems should occur.  Please show the CHEMOTHERAPY ALERT CARD or  IMMUNOTHERAPY ALERT CARD at check-in to the Emergency Department and triage nurse.  Should you have questions after your visit or need to cancel or reschedule your appointment, please contact [facility]ICAL [facility]COL[facility]  Dept: [phone]  and follow the prompts.  Office hours are 8:00 a.m. to 4:30 p.m. Monday - Friday. Please note that voicemails left after 4:00 p.m. may not be returned until the following business day.  We are closed weekends and major holidays. You have access to a nurse at all times for urgent questions. Please call the main number to the clinic Dept: [phone] and follow the prompts.   For any non-urgent questions, you may also contact your provider using MyChart. We now offer e-Visits for anyone 18 and older to request care online for non-urgent symptoms. For details visit [email or web address].   Also download the MyChart app! Go to the app store, search "MyChart", open the app, select [facility], and log in with your MyChart username and password.  Masks are optional in the cancer centers. If you would like for your care team to wear a mask while they are taking care of you, please let them know. You may have one support person who is at least 74 years old accompany you for your appointments. 

## 2022-02-09 ENCOUNTER — Other Ambulatory Visit: Payer: Self-pay

## 2022-02-09 ENCOUNTER — Encounter: Payer: Self-pay | Admitting: Family Medicine

## 2022-02-09 ENCOUNTER — Ambulatory Visit: Payer: Medicare Other | Admitting: Family Medicine

## 2022-02-09 VITALS — BP 124/70 | HR 65 | Temp 97.8°F | Ht 65.0 in | Wt 248.0 lb

## 2022-02-09 DIAGNOSIS — C786 Secondary malignant neoplasm of retroperitoneum and peritoneum: Secondary | ICD-10-CM | POA: Diagnosis not present

## 2022-02-09 DIAGNOSIS — E1169 Type 2 diabetes mellitus with other specified complication: Secondary | ICD-10-CM | POA: Diagnosis not present

## 2022-02-09 DIAGNOSIS — C182 Malignant neoplasm of ascending colon: Secondary | ICD-10-CM | POA: Diagnosis not present

## 2022-02-09 DIAGNOSIS — Z23 Encounter for immunization: Secondary | ICD-10-CM | POA: Diagnosis not present

## 2022-02-09 DIAGNOSIS — E785 Hyperlipidemia, unspecified: Secondary | ICD-10-CM

## 2022-02-09 DIAGNOSIS — I10 Essential (primary) hypertension: Secondary | ICD-10-CM | POA: Diagnosis not present

## 2022-02-09 LAB — HEMOGLOBIN A1C: Hgb A1c MFr Bld: 7 % — ABNORMAL HIGH (ref 4.6–6.5)

## 2022-02-09 LAB — GLUCOSE, RANDOM: Glucose, Bld: 144 mg/dL — ABNORMAL HIGH (ref 70–99)

## 2022-02-09 NOTE — Progress Notes (Signed)
[facility] [address] [place] [address] Dept: [phone] Dept Fax: [phone]  Chronic Care Office Visit  Subjective:    Patient ID: Kim Castro, female    DOB: 06-05-1945, 76 y.o..   MRN: [id number]  Chief Complaint  Patient presents with   Follow-up    3 month f/u.      History of Present Illness:  Patient is in today for reassessment of chronic medical issues.  Ms. Millman has a history of type 2 diabetes. She is managed on metformin 500 mg bid.    Ms. Gravois has a history of hypertension. She is managed on losartan 25 mg daily.   Ms. Mcburney has a history of hyperlipidemia. She is currently on pravastatin 10 mg daily.   Ms. Tolson has a history of both breast and colon cancer. Since her last visit, Ms. Iacobucci was found to have metastatic colon cancer throughout the peritoneal cavity, esp. involving both ovaries. She underwent a total hysterectomy. She currently has a Port-a-cath in place and is receiving chemotherapy. Despite her surgery, her CEA levels have continued to climb.She is now being considered for HIPEC surgery. Ms. Knock notes she does feel some depressive symptoms at times, esp. as she faces further surgery. However, she does not feel she needs medication related to this.  Past Medical History: Patient Active Problem List   Diagnosis Date Noted   Metastasis to peritoneal cavity ([place]) 01/12/2022   Nausea without vomiting 12/23/2021   Postoperative state 12/22/2021   Thickened endometrium    Essential hypertension 05/12/2021   Vitamin D deficiency 05/12/2021   Personal history of malignant neoplasm of breast 05/12/2021   Personal history of colonic polyps 05/12/2021   Morbid obesity ([place]) 05/12/2021   Hyperlipidemia 05/12/2021   Hypercalcemia 05/12/2021   Allergic rhinitis 05/12/2021   Knee joint replacement status, bilateral 05/12/2021   Neuropathy, secondary to Oxaliplatin G2 05/12/2021    Aortic atherosclerosis ([place]) 05/12/2021   History of colon cancer, stage III 02/10/2021   Gastroesophageal reflux disease    Type 2 diabetes mellitus with hyperlipidemia ([place])    History of pulmonary embolism 01/27/2020   Port-A-Cath in place 12/30/2019   Right colon cancer 11/01/2019   Genetic testing 07/08/2014   Malignant neoplasm of female breast ([place]) 06/04/2014   Past Surgical History:  Procedure Laterality Date   BREAST BIOPSY Bilateral    BREAST LUMPECTOMY Left    BREAST LUMPECTOMY WITH RADIOACTIVE SEED LOCALIZATION Left 07/01/2014   Procedure: LEFT BREAST LUMPECTOMY WITH RADIOACTIVE SEED LOCALIZATION;  Surgeon: Excell Seltzer, MD;  Location: [facility];  Service: General;  Laterality: Left;   CATARACT EXTRACTION Right    COLONOSCOPY  09/24/2019   [facility]   LAPAROSCOPIC PARTIAL COLECTOMY N/A 11/01/2019   Procedure: LAPAROSCOPIC PARTIAL COLECTOMY;  Surgeon: Leighton Ruff, MD;  Location: [facility];  Service: General;  Laterality: N/A;   POLYPECTOMY     PORTACATH PLACEMENT N/A 12/10/2019   Procedure: INSERTION PORT-A-CATH ULTRASOUND GUIDED IN RIGHT IJ;  Surgeon: Leighton Ruff, MD;  Location: [facility];  Service: General;  Laterality: N/A;   ROBOTIC ASSISTED TOTAL HYSTERECTOMY Bilateral 12/22/2021   Procedure: XI ROBOTIC ASSISTED TOTAL HYSTERECTOMY WITH BILATERAL SALPINGO OOPHORECTOMY;  Surgeon: [name], MD;  Location: [facility];  Service: Gynecology;  Laterality: Bilateral;   TOTAL KNEE ARTHROPLASTY Left 10/24/2012   Procedure: TOTAL KNEE ARTHROPLASTY;  Surgeon: Kerin Salen, MD;  Location: [facility];  Service: Orthopedics;  Laterality: Left;   TOTAL  KNEE ARTHROPLASTY Right 01/09/2013   Procedure: TOTAL KNEE ARTHROPLASTY;  Surgeon: Kerin Salen, MD;  Location: [facility];  Service: Orthopedics;  Laterality: Right;   TUBAL LIGATION     Family History  Problem Relation Age of Onset   Diabetes Mother    Uterine cancer Mother        deceased 12   Hypertension  Father    Heart disease Father    Diabetes Sister    Breast cancer Sister 40       currently 68   Hypertension Sister    Diabetes Sister    Breast cancer Sister    Diabetes Sister    Kidney disease Sister    Heart disease Sister    Lupus Sister    Heart disease Sister    Diabetes Sister    Diabetes Sister    Hypertension Brother    Diabetes Brother    Colon cancer Brother 48   Colon polyps Daughter    Thyroid cancer Daughter 31       currently 26; type?   Cancer Maternal Uncle        unk. primary; deceased 30s   Stroke Maternal Grandmother    Esophageal cancer Neg Hx    Rectal cancer Neg Hx    Stomach cancer Neg Hx    Outpatient Medications Prior to Visit  Medication Sig Dispense Refill   acetaminophen (TYLENOL) 325 MG tablet Take 650 mg by mouth every 6 (six) hours as needed for moderate pain.     b complex vitamins capsule Take 1 capsule by mouth daily.     Cholecalciferol (VITAMIN D) 2000 UNITS CAPS Take 2,000 Units by mouth daily.      lidocaine-prilocaine (EMLA) cream Apply to affected area once 30 g 3   loratadine (CLARITIN) 10 MG tablet Take 10 mg by mouth daily as needed for allergies.     losartan (COZAAR) 25 MG tablet Take 1 tablet (25 mg total) by mouth daily. 90 tablet 3   metFORMIN (GLUCOPHAGE) 500 MG tablet Take 1 tablet (500 mg total) by mouth in the morning and at bedtime. 180 tablet 3   Multiple Vitamins-Minerals (MULTIVITAMIN WITH MINERALS) tablet Take 1 tablet by mouth daily.     omeprazole (PRILOSEC) 20 MG capsule Take 20 mg by mouth daily before breakfast.      ondansetron (ZOFRAN) 8 MG tablet Take 1 tablet (8 mg total) by mouth every 8 (eight) hours as needed for nausea or vomiting. Start on the third day after chemotherapy. 30 tablet 1   pravastatin (PRAVACHOL) 10 MG tablet Take 1 tablet (10 mg total) by mouth daily. 90 tablet 3   pregabalin (LYRICA) 25 MG capsule Take 1 capsule by mouth once daily 30 capsule 0   prochlorperazine (COMPAZINE) 10 MG  tablet Take 1 tablet (10 mg total) by mouth every 6 (six) hours as needed for nausea or vomiting. 30 tablet 1   No facility-administered medications prior to visit.   Allergies  Allergen Reactions   Oxycodone Nausea And Vomiting   Objective:   Today's Vitals   02/09/22 0915  BP: 124/70  Pulse: 65  Temp: 97.8 F (36.6 C)  TempSrc: Temporal  SpO2: 96%  Weight: 248 lb (112.5 kg)  Height: '5\' 5"'$  (1.651 m)   Body mass index is 41.27 kg/m.   General: Well developed, well nourished. No acute distress. Psych: Alert and oriented. Normal mood and affect.  Health Maintenance Due  Topic Date Due   INFLUENZA VACCINE  11/30/2021   Lab Results Last CBC Lab Results  Component Value Date   WBC 5.2 02/04/2022   HGB 11.2 (L) 02/04/2022   HCT 33.5 (L) 02/04/2022   MCV 98.0 02/04/2022   MCH 32.7 02/04/2022   RDW 12.9 02/04/2022   PLT 223 05/04/7251   Last metabolic panel Lab Results  Component Value Date   GLUCOSE 159 (H) 02/04/2022   NA 143 02/04/2022   K 3.6 02/04/2022   CL 110 02/04/2022   CO2 27 02/04/2022   BUN 15 02/04/2022   CREATININE 0.79 02/04/2022   GFRNONAA >60 02/04/2022   CALCIUM 9.1 02/04/2022   PROT 6.6 02/04/2022   ALBUMIN 3.9 02/04/2022   BILITOT 1.2 02/04/2022   ALKPHOS 79 02/04/2022   AST 24 02/04/2022   ALT 25 02/04/2022   ANIONGAP 6 02/04/2022     (01/13/22) 2 mo ago (11/29/21) 3 mo ago (10/25/21) 5 mo ago (08/26/21) 11 mo ago (03/02/21) 1 yr ago (11/30/20) 1 yr ago (08/26/20)    CEA ([facility]) 0.00 - 5.00 ng/mL 22.67 High   16.23 High  CM  9.83 High  CM  6.99 High  CM  4.17 CM  3.41 CM  2.45       Assessment & Plan:   1. Type 2 diabetes mellitus with hyperlipidemia ([place]) Diabetes has been in good control. We will check Ms. Guedea's A1c today. Plan to continue metformin 500 mg bid.  - Glucose, random - Hemoglobin A1c  2. Essential hypertension Blood pressure is at goal. Continue losartan 25 mg daily.  3. Malignant neoplasm of ascending  colon ([place]) 4. Metastasis to peritoneal cavity [facility]) She will continue to work with Dr. Burr Medico regarding chemotherapy and consideration for HIPEC surgery.  5. Need for influenza vaccination  - Flu Vaccine QUAD High Dose(Fluad)   Return in about 3 months (around 05/12/2022) for Reassessment.   [name], MD

## 2022-02-10 ENCOUNTER — Other Ambulatory Visit: Payer: Self-pay

## 2022-02-10 ENCOUNTER — Encounter: Payer: Self-pay | Admitting: Hematology

## 2022-02-10 ENCOUNTER — Telehealth: Payer: Self-pay | Admitting: Hematology

## 2022-02-10 ENCOUNTER — Inpatient Hospital Stay: Payer: Medicare Other

## 2022-02-10 DIAGNOSIS — Z5111 Encounter for antineoplastic chemotherapy: Secondary | ICD-10-CM | POA: Diagnosis not present

## 2022-02-10 DIAGNOSIS — C182 Malignant neoplasm of ascending colon: Secondary | ICD-10-CM

## 2022-02-10 MED ORDER — SODIUM CHLORIDE 0.9% FLUSH
10.0000 mL | INTRAVENOUS | Status: DC | PRN
  Administered 2022-02-10: 10 mL

## 2022-02-10 MED ORDER — HEPARIN SOD (PORK) LOCK FLUSH 100 UNIT/ML IV SOLN
500.0000 [IU] | Freq: Once | INTRAVENOUS | Status: AC | PRN
  Administered 2022-02-10: 500 [IU]

## 2022-02-10 NOTE — Telephone Encounter (Signed)
Scheduled follow-up appointments per appointment request workqueue. Patient is aware. 

## 2022-02-15 ENCOUNTER — Other Ambulatory Visit: Payer: Self-pay | Admitting: Hematology

## 2022-02-15 DIAGNOSIS — R200 Anesthesia of skin: Secondary | ICD-10-CM

## 2022-02-18 NOTE — Progress Notes (Signed)
[place] OFFICE PROGRESS NOTE  Kim Salter, MD [place] [address]  DIAGNOSIS:  f/u of metastatic colon cancer, h/o DCIS  Oncology History Overview Note   Cancer Staging  Malignant neoplasm of female breast [facility]) Staging form: Breast, AJCC 7th Edition - Clinical stage from 06/11/2014: Stage 0 (Tis (DCIS), N0, M0) - Unsigned Staged by: Pathologist and managing physician Laterality: Left Estrogen receptor status: Positive Progesterone receptor status: Positive Stage used in treatment planning: Yes National guidelines used in treatment planning: Yes Type of national guideline used in treatment planning: NCCN - Pathologic stage from 07/03/2014: Stage Unknown (Tis (DCIS), NX, cM0) - Signed by Enid Cutter, MD on 07/10/2014 Staged by: Pathologist Laterality: Left Estrogen receptor status: Positive Progesterone receptor status: Positive Stage used in treatment planning: Yes National guidelines used in treatment planning: Yes Type of national guideline used in treatment planning: NCCN Staging comments: Staged on final lumpectomy specimen by Dr. Donato Heinz.  Right colon cancer Staging form: Colon and Rectum, AJCC 8th Edition - Pathologic stage from 11/01/2019: Stage IIIB (pT3, pN2a, cM0) - Signed by Truitt Merle, MD on 11/29/2019 Stage prefix: Initial diagnosis Histologic grading system: 4 grade system Histologic grade (G): G2 Residual tumor (R): R0 - None Tumor deposits (TD): Absent Perineural invasion (PNI): Absent Microsatellite instability (MSI): Stable KRAS mutation: Unknown NRAS mutation: Unknown BRAF mutation: Unknown     Malignant neoplasm of female breast ([place])  05/29/2014 Initial Biopsy   Left breast needle core biopsy: Grade 2, DCIS with calcs. ER+ (100%), PR+ (96%).    06/04/2014 Initial Diagnosis   Left breast DCIS with calcifications, ER 100%, PR 96%   06/10/2014 Breast MRI   Left breast: 2.4 x 1.3 x 1.1 cm area of patchy non-mass  enhancement upper outer quadrant includes postbiopsy seroma; Right breast: 1.2 cm previously biopsied stable benign fibroadenoma   06/12/2014 Procedure   Genetic counseling/testing: Identified 1 VUS on CHEK2 gene. Remainder of 17 gene panel tested negative and included: ATM, BARD1, BRCA 1/2, BRIP1, CDH1, CHEK2, EPCAM, MLH1, MSH2, MSH6, NBN, NF1, PALB2, PTEN, RAD50, RAD51C, RAD51D, STK11, and TP53.    07/01/2014 Surgery   Left breast lumpectomy ([facility]): Grade 1, DCIS, spanning 2.3 cm, 1 mm margin, ER 100%, PR 96%   07/31/2014 - 08/28/2014 Radiation Therapy   Adjuvant RT completed Pablo Ledger). Left breast: Total dose 42.5 Gy over 17 fractions. Left breast boost: Total dose 7.5 Gy over 3 fractions.    09/14/2014 - 01/13/2020 Anti-estrogen oral therapy   Anastrazole $RemoveBefo'1mg'LHnrfNKLLpI$  daily. Planned duration of treatment: 5 years [place]). Completed in 01/2020.    09/25/2014 Survivorship   Survivorship Care Plan given to patient and reviewed with her in person.    03/02/2021 Imaging   CT CAP  IMPRESSION: 1. No findings of active/recurrent malignancy. Partial right hemicolectomy. 2. Endometrial stripe remains mildly thickened, but endometrial biopsy in May was negative for malignancy. 3. Progressive endplate sclerosis and endplate irregularity at T2-3, probably due to degenerative endplate findings. If the has referable upper thoracic pain/symptoms then thoracic spine MRI could be used for further workup. 4. Other imaging findings of potential clinical significance: Mild cardiomegaly. Aortic Atherosclerosis (ICD10-I70.0). Mild mitral valve calcification. Postoperative findings in the left breast with adjacent radiation port anteriorly in the left upper lobe. Tiny pulmonary nodules in the left lower lobe are unchanged from earliest available comparison of 10/17/2019 and probably benign although may merit surveillance. Multilevel lumbar impingement. Mild pelvic floor laxity.   Right colon cancer  09/19/2019  Imaging   CT AP W contrast 09/19/19  IMPRESSION Fullness in the cecum, cannot exclude a mass. No evidence for metastatic disease is identified.    09/24/2019 Procedure   Colonoscopy by Dr Eber Jones 09/24/19 IMPRESSION 1. The colon was redundant  2. Mild diverticulosis was noted through the entire examined colon 3. Single 52mm polyp was found in the ascending colon; polypectomy was performed using snare cautery and biopsy forceps 4. Mild diverticulosis was notes in the descending colon and sigmoid colon.  5. Single polyp was found in the sigmoid colon, polypectomy was performed with cold forceps.  6. Single polyp was found in the rectosigmoid colon; polypectomy was performed with cold snare  7. Small internal hemorrhoids  8. Large mass was found at the cecum; multiple biopsies of the area were performed using cold forceps; injection (tattooing) was performed distal to the mass.    09/24/2019 Initial Biopsy   INTERPRETATION AND DIAGNOSIS:  A. Cecum, biopsy:  Invasive moderately differentiated adenocarcinoma.  see comment  B. Polyp @ ascending colon, polypectomy:  Tubular Adenoma  C. Polyp @ sigmoid colon Polypectomy:  hyperplastic polyp.  D. Polyp @ rectosigmoid colon, Polypectomy:  Hyperplastic Polyp      10/16/2019 Imaging   CT Chest IMPRESSION: 1. Multiple small pulmonary nodules measuring 5 mm or less in size in the lungs. These are nonspecific and are typically considered statistically likely benign. However, given the patient's history of primary malignancy, close attention on follow-up studies is recommended to ensure stability. 2. Aortic atherosclerosis, in addition to right coronary artery disease. Assessment for potential risk factor modification, dietary therapy or pharmacologic therapy may be warranted, if clinically indicated. 3. There are calcifications of the aortic valve and mitral annulus. Echocardiographic correlation for evaluation of potential valvular dysfunction  may be warranted if clinically indicated. 4. Small hiatal hernia.   Aortic Atherosclerosis (ICD10-I70.0).   11/01/2019 Initial Diagnosis   Colon cancer ([place])   11/01/2019 Surgery   LAPAROSCOPIC PARTIAL COLECTOMY by Dr Marcello Moores and Dr Johney Maine   11/01/2019 Pathology Results   FINAL MICROSCOPIC DIAGNOSIS:   A. COLON, PROXIMAL RIGHT, COLECTOMY:  - Invasive colonic adenocarcinoma, 5 cm.  - Tumor invades through the muscularis propria into pericolonic tissues.   - Margins of resection are not involved.  - Metastatic carcinoma in (5) of (13) lymph nodes.  - See oncology table.    MSI Stable  Mismatch repair normal  MLH1 - Preserved nuclear expression (greater 50% tumor expression) MSH2 - Preserved nuclear expression (greater 50% tumor expression) MSH6 - Preserved nuclear expression (greater 50% tumor expression) PMS2 - Preserved nuclear expression (greater 50% tumor expression)   11/01/2019 Cancer Staging   Staging form: Colon and Rectum, AJCC 8th Edition - Pathologic stage from 11/01/2019: Stage IIIB (pT3, pN2a, cM0) - Signed by Truitt Merle, MD on 11/29/2019   12/10/2019 Procedure   PAC placed 12/10/19   12/17/2019 - 06/01/2020 Chemotherapy   FOLFOX q2weeks starting in 2 weeks starting 12/17/19. Held 01/27/20-02/10/20 due to b/l PE. Oxaliplatin held C11-12 due to neuropathy. Completed on 06/01/20   03/02/2021 Imaging   CT CAP  IMPRESSION: 1. No findings of active/recurrent malignancy. Partial right hemicolectomy. 2. Endometrial stripe remains mildly thickened, but endometrial biopsy in May was negative for malignancy. 3. Progressive endplate sclerosis and endplate irregularity at T2-3, probably due to degenerative endplate findings. If the has referable upper thoracic pain/symptoms then thoracic spine MRI could be used for further workup. 4. Other imaging findings of potential clinical significance: Mild cardiomegaly. Aortic Atherosclerosis (  ICD10-I70.0). Mild mitral valve calcification.  Postoperative findings in the left breast with adjacent radiation port anteriorly in the left upper lobe. Tiny pulmonary nodules in the left lower lobe are unchanged from earliest available comparison of 10/17/2019 and probably benign although may merit surveillance. Multilevel lumbar impingement. Mild pelvic floor laxity.   08/26/2021 Imaging   EXAM: CT CHEST, ABDOMEN, AND PELVIS WITH CONTRAST  IMPRESSION: 1. Stable examination without new or progressive findings to suggest local recurrence or metastatic disease within the chest, abdomen, or pelvis. 2. Hepatomegaly with hepatic steatosis. 3. Sigmoid colonic diverticulosis without findings of acute diverticulitis. 4. Similar prominent endplate sclerosis and irregularity at T2-T3 is most consistent with Modic type endplate degenerative changes. However, if patient has referable upper thoracic pain consider further workup with thoracic spine MRI. 5. Similar thickening of the endometrial stripe measuring up to 8 mm, which was previously biopsied with results negative for malignancy. 6. Aortic Atherosclerosis (ICD10-I70.0).   11/10/2021 PET scan   IMPRESSION: 1. LEFT ovary is increased in size and is intensely hypermetabolic. While physiologic hypermetabolic ovarian tissue is not uncommon, the enlargement and asymmetric activity warrants further evaluation. Consider contrast pelvic MRI vs tissue sampling. 2. No evidence of metastatic colorectal carcinoma otherwise. 3. Post RIGHT hemicolectomy anatomy. 4. Evidence of radiation change in the LEFT upper lobe (remote breast cancer).     12/22/2021 Relapse/Recurrence    FINAL MICROSCOPIC DIAGNOSIS:   A. LEFT OVARY AND FALLOPIAN TUBE, SALPINGO OOPHORECTOMY:  - Metastatic moderately differentiated colonic adenocarcinoma involving left ovary  - Focal ovarian stromal calcification  - Segment of benign fallopian tube   B. UTERUS WITH RIGHT FALLOPIAN TUBE AND OVARY, HYSTERECTOMY AND  SALPINGO-OOPHORECTOMY:  - Metastatic moderately differentiated colonic adenocarcinoma involving right ovary  - Focal invasive extensive adenomyosis  - Benign endometrial polyps  - Benign proliferative phase endometrium  - Hydrosalpinx of right fallopian tube  - Focal ovarian stromal calcification   C. PERITONEAL DEPOSITS, ANTERIOR CUL DE SAC, BIOPSY:  - Metastatic mucinous adenocarcinoma, consistent with colorectal primary    COMMENT:  Immunohistochemical stains show that the tumor cells are positive for CK20 and CDX2 while they are negative for CK7 and PAX8, consistent with above interpretation.    01/24/2022 -  Chemotherapy   Patient is on Treatment Plan : COLORECTAL FOLFOX q14d x 3 months        CURRENT THERAPY: FOLFOX, q14d, restarted 01/24/22. She is here for C3 today.   INTERVAL HISTORY: Kim Castro 76 y.o. female returns to the clinic today for follow-up visit.  The patient was last seen by Dr. Mosetta Putt on 02/04/2022.  The patient is status post 2 cycles of FOLFOX and she has been tolerating it fairly well except for mild neuropathy for which Dr. Mosetta Putt reduce the dose of oxaliplatin with her last cycle of treatment.  Her peripheral neuropathy is stable.  She has some fatigue and cold sensitivity with treatment but nothing unmanageable.  She has some right upper quadrant discomfort following her pump DC that is relieved with Tylenol a few days after treatment.  Today she denies any fever, chills, night sweats, appetite change, or weight loss.  Denies any chest pain, shortness of breath, cough, or hemoptysis.  Denies any nausea, vomiting, diarrhea, or constipation.  Blood in the stool?  She denies any jaundice or itching.  Peripheral neuropathy she takes Lyrica.  She is here today for evaluation before undergoing cycle #3.     MEDICAL HISTORY: Past Medical History:  Diagnosis Date  Aortic atherosclerosis (HCC)    Arthritis    feet, lower back   Basal cell carcinoma    arm    Breast cancer of upper-outer quadrant of left female breast ([place]) 06/04/2014   Cataract    immature on the left   Colon cancer ([place]) 08/2019   Diabetes mellitus without complication (HCC)    Diverticulosis    Dizziness    > 71yrs ago;took Antivert    Family history of anesthesia complication    sister slow to wake up with anesthesia   Family history of breast cancer    Family history of colon cancer    Family history of uterine cancer    GERD (gastroesophageal reflux disease)    takes occasional TUMs   History of bronchitis    > 33yrs ago   History of colon polyps    History of hiatal hernia    Small noted on CT   History of pulmonary embolus (PE)    Hypertension    takes Losartan daily and HCTZ   Iron deficiency anemia    Joint pain    Numbness    to toes on each foot   Peripheral neuropathy    feet and toes   Personal history of radiation therapy    Pulmonary nodules    Noted on CT   Radiation 07/31/14-08/28/14   Left Breast 20 fxs   Seasonal allergies    takes Claritin prn   Urinary frequency    Vitamin D deficiency    takes VIt D daily    ALLERGIES:  is allergic to oxycodone.  MEDICATIONS:  Current Outpatient Medications  Medication Sig Dispense Refill   acetaminophen (TYLENOL) 325 MG tablet Take 650 mg by mouth every 6 (six) hours as needed for moderate pain.     b complex vitamins capsule Take 1 capsule by mouth daily.     Cholecalciferol (VITAMIN D) 2000 UNITS CAPS Take 2,000 Units by mouth daily.      lidocaine-prilocaine (EMLA) cream Apply to affected area once 30 g 3   loratadine (CLARITIN) 10 MG tablet Take 10 mg by mouth daily as needed for allergies.     losartan (COZAAR) 25 MG tablet Take 1 tablet (25 mg total) by mouth daily. 90 tablet 3   metFORMIN (GLUCOPHAGE) 500 MG tablet Take 1 tablet (500 mg total) by mouth in the morning and at bedtime. 180 tablet 3   Multiple Vitamins-Minerals (MULTIVITAMIN WITH MINERALS) tablet Take 1 tablet by mouth daily.      omeprazole (PRILOSEC) 20 MG capsule Take 20 mg by mouth daily before breakfast.      ondansetron (ZOFRAN) 8 MG tablet Take 1 tablet (8 mg total) by mouth every 8 (eight) hours as needed for nausea or vomiting. Start on the third day after chemotherapy. 30 tablet 1   pravastatin (PRAVACHOL) 10 MG tablet Take 1 tablet (10 mg total) by mouth daily. 90 tablet 3   pregabalin (LYRICA) 25 MG capsule Take 1 capsule by mouth once daily 30 capsule 0   prochlorperazine (COMPAZINE) 10 MG tablet Take 1 tablet (10 mg total) by mouth every 6 (six) hours as needed for nausea or vomiting. 30 tablet 1   No current facility-administered medications for this visit.    SURGICAL HISTORY:  Past Surgical History:  Procedure Laterality Date   BREAST BIOPSY Bilateral    BREAST LUMPECTOMY Left    BREAST LUMPECTOMY WITH RADIOACTIVE SEED LOCALIZATION Left 07/01/2014   Procedure: LEFT BREAST LUMPECTOMY WITH RADIOACTIVE  SEED LOCALIZATION;  Surgeon: Excell Seltzer, MD;  Location: [facility];  Service: General;  Laterality: Left;   CATARACT EXTRACTION Right    COLONOSCOPY  09/24/2019   [facility]   LAPAROSCOPIC PARTIAL COLECTOMY N/A 11/01/2019   Procedure: LAPAROSCOPIC PARTIAL COLECTOMY;  Surgeon: Leighton Ruff, MD;  Location: [facility];  Service: General;  Laterality: N/A;   POLYPECTOMY     PORTACATH PLACEMENT N/A 12/10/2019   Procedure: INSERTION PORT-A-CATH ULTRASOUND GUIDED IN RIGHT IJ;  Surgeon: Leighton Ruff, MD;  Location: [facility];  Service: General;  Laterality: N/A;   ROBOTIC ASSISTED TOTAL HYSTERECTOMY Bilateral 12/22/2021   Procedure: XI ROBOTIC ASSISTED TOTAL HYSTERECTOMY WITH BILATERAL SALPINGO OOPHORECTOMY;  Surgeon: [name], MD;  Location: [facility];  Service: Gynecology;  Laterality: Bilateral;   TOTAL KNEE ARTHROPLASTY Left 10/24/2012   Procedure: TOTAL KNEE ARTHROPLASTY;  Surgeon: Kerin Salen, MD;  Location: [facility];  Service: Orthopedics;  Laterality: Left;   TOTAL KNEE  ARTHROPLASTY Right 01/09/2013   Procedure: TOTAL KNEE ARTHROPLASTY;  Surgeon: Kerin Salen, MD;  Location: [facility];  Service: Orthopedics;  Laterality: Right;   TUBAL LIGATION      REVIEW OF SYSTEMS:   Review of Systems  Constitutional: Negative for appetite change, chills, fatigue, fever and unexpected weight change.  HENT:   Negative for mouth sores, nosebleeds, sore throat and trouble swallowing.   Eyes: Negative for eye problems and icterus.  Respiratory: Negative for cough, hemoptysis, shortness of breath and wheezing.   Cardiovascular: Negative for chest pain and leg swelling.  Gastrointestinal: Negative for abdominal pain, constipation, diarrhea, nausea and vomiting.  Genitourinary: Negative for bladder incontinence, difficulty urinating, dysuria, frequency and hematuria.   Musculoskeletal: Negative for back pain, gait problem, neck pain and neck stiffness.  Skin: Negative for itching and rash.  Neurological: Negative for dizziness, extremity weakness, gait problem, headaches, light-headedness and seizures.  Hematological: Negative for adenopathy. Does not bruise/bleed easily.  Psychiatric/Behavioral: Negative for confusion, depression and sleep disturbance. The patient is not nervous/anxious.     PHYSICAL EXAMINATION:  There were no vitals taken for this visit.  ECOG PERFORMANCE STATUS: {CHL ONC ECOG [id number]  Physical Exam  Constitutional: Oriented to person, place, and time and well-developed, well-nourished, and in no distress. No distress.  HENT:  Head: Normocephalic and atraumatic.  Mouth/Throat: Oropharynx is clear and moist. No oropharyngeal exudate.  Eyes: Conjunctivae are normal. Right eye exhibits no discharge. Left eye exhibits no discharge. No scleral icterus.  Neck: Normal range of motion. Neck supple.  Cardiovascular: Normal rate, regular rhythm, normal heart sounds and intact distal pulses.   Pulmonary/Chest: Effort normal and breath sounds normal. No  respiratory distress. No wheezes. No rales.  Abdominal: Soft. Bowel sounds are normal. Exhibits no distension and no mass. There is no tenderness.  Musculoskeletal: Normal range of motion. Exhibits no edema.  Lymphadenopathy:    No cervical adenopathy.  Neurological: Alert and oriented to person, place, and time. Exhibits normal muscle tone. Gait normal. Coordination normal.  Skin: Skin is warm and dry. No rash noted. Not diaphoretic. No erythema. No pallor.  Psychiatric: Mood, memory and judgment normal.  Vitals reviewed.  LABORATORY DATA: Lab Results  Component Value Date   WBC 5.2 02/04/2022   HGB 11.2 (L) 02/04/2022   HCT 33.5 (L) 02/04/2022   MCV 98.0 02/04/2022   PLT 223 02/04/2022      Chemistry      Component Value Date/Time   NA 143 02/04/2022 1256   NA 144  10/03/2014 0914   K 3.6 02/04/2022 1256   K 4.6 10/03/2014 0914   CL 110 02/04/2022 1256   CO2 27 02/04/2022 1256   CO2 29 10/03/2014 0914   BUN 15 02/04/2022 1256   BUN 17.8 10/03/2014 0914   CREATININE 0.79 02/04/2022 1256   CREATININE 0.81 01/24/2022 0747   CREATININE 0.8 10/03/2014 0914      Component Value Date/Time   CALCIUM 9.1 02/04/2022 1256   CALCIUM 9.7 10/03/2014 0914   ALKPHOS 79 02/04/2022 1256   ALKPHOS 74 10/03/2014 0914   AST 24 02/04/2022 1256   AST 18 01/24/2022 0747   AST 18 10/03/2014 0914   ALT 25 02/04/2022 1256   ALT 14 01/24/2022 0747   ALT 13 10/03/2014 0914   BILITOT 1.2 02/04/2022 1256   BILITOT 0.9 01/24/2022 0747   BILITOT 0.98 10/03/2014 0914       RADIOGRAPHIC STUDIES:  No results found.   ASSESSMENT & PLAN:  Kim Castro is a 76 y.o. female with    1. Right colon cancer, pT3N2aM0 stage IIB, MSS, metastatic to b/l ovaries and peritoneum, KRAS mutation G12V (+)  -diagnosed in 08/2019 with cecal mass, biopsy showed moderately differentiated adenocarcinoma. CT chest 10/16/19 showed nonspecific small lung nodules. -s/p colon surgery with Dr Marcello Moores on 11/01/19.  Path showed overall Stage IIIB cancer.  -She received 6 months of FOLFOX to reduce her risk of recurrence, completed 05/2020.  -surveillance colonoscopy on 02/10/21 under Dr. Carlean Purl showed only diverticulosis. Repeat due in 01/2024. -restaging CT CAP 08/26/21 was NED. -restaging PET scan on 11/10/21 showed hypermetabolism to enlarged left ovary, no other evidence of metastatic disease. -s/p total hysterectomy on 12/22/21 with Dr. Berline Lopes. Path revealed metastatic moderately differentiated colonic adenocarcinoma involving both ovaries. Peritoneal deposits were also positive for metastatic mucinous adenocarcinoma, consistent with colorectal primary. -her CEA as also risen, consistent with worsening disease. Most recently up to 22.67 on 01/13/22. -[employer] showed KRAS mutation. She is not eligible for any targeted therapy.  -she restarted FOLFOX today, 01/24/22. She has mild neuropathy, I will slightly reduce oxaliplatin dose. -Dr. Burr Medico plans to refer her to Dr. Crisoforo [facility] at [facility] for consideration of HIPEC surgery. Because of this, she do not plan to give her bevacizumab. She wants to check with her insurance first  -labs reviewed, overall WNL except for some mild anemia from chemo, hgb 11.2. Adequate to proceed with FOLFOX as scheduled.   2. Neuropathy, secondary to Oxaliplatin G1 -S/p C7 she started having numbness in hands, L>R which become prolonged with tingling s/p C10.  -Oxaliplatin dose reduced and ultimately held with C11-12.  -she is on Lyrica and B vit complex, mild and stable   3. H/o left breast DCIS, G2, ER/PR+ -Dx in 05/2014, s/p left lumpectomy with Dr Excell Seltzer, adjuvant RT with Dr Pablo Ledger, and Anastrozole 08/2014 - 01/13/20 under Dr Lindi Adie.  -DEXA 09/11/20 T score +1.2 normal.  -most recent mammogram on 10/04/21 was negative. -plan to start screening breast MRI in 04/2022.     PLAN:  -proceed with C3 FOLFOX at same dose.  -lab, flush, f/u, and FOLFOX every 2 weeks             -she  prefers Monday appointments      No orders of the defined types were placed in this encounter.    I spent {CHL ONC TIME VISIT - ZYYQM:[id number]} counseling the patient face to face. The total time spent in the appointment was {CHL ONC TIME VISIT - QBVQX:[id number]}.  Wyland Rastetter L Zyasia Halbleib, PA-C 02/18/22

## 2022-02-22 ENCOUNTER — Telehealth: Payer: Self-pay | Admitting: Physician Assistant

## 2022-02-22 ENCOUNTER — Inpatient Hospital Stay: Payer: Medicare Other

## 2022-02-22 ENCOUNTER — Other Ambulatory Visit: Payer: Self-pay

## 2022-02-22 ENCOUNTER — Inpatient Hospital Stay: Payer: Medicare Other | Admitting: Physician Assistant

## 2022-02-22 ENCOUNTER — Ambulatory Visit (HOSPITAL_COMMUNITY)
Admission: RE | Admit: 2022-02-22 | Discharge: 2022-02-22 | Disposition: A | Discharge status: Home / Self Care | Payer: Medicare Other | Source: Ambulatory Visit | Attending: Physician Assistant | Admitting: Physician Assistant

## 2022-02-22 VITALS — BP 145/65 | HR 65 | Temp 98.6°F

## 2022-02-22 DIAGNOSIS — R0609 Other forms of dyspnea: Secondary | ICD-10-CM | POA: Insufficient documentation

## 2022-02-22 DIAGNOSIS — C180 Malignant neoplasm of cecum: Secondary | ICD-10-CM | POA: Diagnosis not present

## 2022-02-22 DIAGNOSIS — C182 Malignant neoplasm of ascending colon: Secondary | ICD-10-CM

## 2022-02-22 DIAGNOSIS — C779 Secondary and unspecified malignant neoplasm of lymph node, unspecified: Secondary | ICD-10-CM | POA: Diagnosis not present

## 2022-02-22 DIAGNOSIS — Z5111 Encounter for antineoplastic chemotherapy: Secondary | ICD-10-CM | POA: Diagnosis not present

## 2022-02-22 DIAGNOSIS — C786 Secondary malignant neoplasm of retroperitoneum and peritoneum: Secondary | ICD-10-CM

## 2022-02-22 DIAGNOSIS — Z86000 Personal history of in-situ neoplasm of breast: Secondary | ICD-10-CM | POA: Diagnosis not present

## 2022-02-22 DIAGNOSIS — Z90710 Acquired absence of both cervix and uterus: Secondary | ICD-10-CM | POA: Diagnosis not present

## 2022-02-22 DIAGNOSIS — R918 Other nonspecific abnormal finding of lung field: Secondary | ICD-10-CM | POA: Diagnosis not present

## 2022-02-22 DIAGNOSIS — E1140 Type 2 diabetes mellitus with diabetic neuropathy, unspecified: Secondary | ICD-10-CM | POA: Diagnosis not present

## 2022-02-22 DIAGNOSIS — D6481 Anemia due to antineoplastic chemotherapy: Secondary | ICD-10-CM | POA: Diagnosis not present

## 2022-02-22 DIAGNOSIS — C7963 Secondary malignant neoplasm of bilateral ovaries: Secondary | ICD-10-CM | POA: Diagnosis not present

## 2022-02-22 DIAGNOSIS — I10 Essential (primary) hypertension: Secondary | ICD-10-CM | POA: Diagnosis not present

## 2022-02-22 DIAGNOSIS — Z853 Personal history of malignant neoplasm of breast: Secondary | ICD-10-CM | POA: Diagnosis not present

## 2022-02-22 DIAGNOSIS — Z95828 Presence of other vascular implants and grafts: Secondary | ICD-10-CM

## 2022-02-22 LAB — CBC WITH DIFFERENTIAL (CANCER CENTER ONLY)
Abs Immature Granulocytes: 0.01 10*3/uL (ref 0.00–0.07)
Basophils Absolute: 0 10*3/uL (ref 0.0–0.1)
Basophils Relative: 1 %
Eosinophils Absolute: 0.1 10*3/uL (ref 0.0–0.5)
Eosinophils Relative: 2 %
HCT: 34.3 % — ABNORMAL LOW (ref 36.0–46.0)
Hemoglobin: 11.4 g/dL — ABNORMAL LOW (ref 12.0–15.0)
Immature Granulocytes: 0 %
Lymphocytes Relative: 38 %
Lymphs Abs: 1.9 10*3/uL (ref 0.7–4.0)
MCH: 32.7 pg (ref 26.0–34.0)
MCHC: 33.2 g/dL (ref 30.0–36.0)
MCV: 98.3 fL (ref 80.0–100.0)
Monocytes Absolute: 0.5 10*3/uL (ref 0.1–1.0)
Monocytes Relative: 10 %
Neutro Abs: 2.5 10*3/uL (ref 1.7–7.7)
Neutrophils Relative %: 49 %
Platelet Count: 157 10*3/uL (ref 150–400)
RBC: 3.49 MIL/uL — ABNORMAL LOW (ref 3.87–5.11)
RDW: 14 % (ref 11.5–15.5)
WBC Count: 5.1 10*3/uL (ref 4.0–10.5)
nRBC: 0 % (ref 0.0–0.2)

## 2022-02-22 LAB — CMP (CANCER CENTER ONLY)
ALT: 21 U/L (ref 0–44)
AST: 27 U/L (ref 15–41)
Albumin: 4 g/dL (ref 3.5–5.0)
Alkaline Phosphatase: 79 U/L (ref 38–126)
Anion gap: 11 (ref 5–15)
BUN: 22 mg/dL (ref 8–23)
CO2: 22 mmol/L (ref 22–32)
Calcium: 9.6 mg/dL (ref 8.9–10.3)
Chloride: 109 mmol/L (ref 98–111)
Creatinine: 0.8 mg/dL (ref 0.44–1.00)
GFR, Estimated: >60 mL/min (ref >60)
Glucose, Bld: 163 mg/dL — ABNORMAL HIGH (ref 70–99)
Potassium: 3.8 mmol/L (ref 3.5–5.1)
Sodium: 142 mmol/L (ref 135–145)
Total Bilirubin: 1 mg/dL (ref 0.3–1.2)
Total Protein: 6.6 g/dL (ref 6.5–8.1)

## 2022-02-22 MED ORDER — SODIUM CHLORIDE 0.9 % IV SOLN
2400.0000 mg/m2 | INTRAVENOUS | Status: DC
  Administered 2022-02-22: 5450 mg via INTRAVENOUS
  Filled 2022-02-22: qty 109

## 2022-02-22 MED ORDER — PALONOSETRON HCL INJECTION 0.25 MG/5ML
0.2500 mg | Freq: Once | INTRAVENOUS | Status: AC
  Administered 2022-02-22: 0.25 mg via INTRAVENOUS
  Filled 2022-02-22: qty 5

## 2022-02-22 MED ORDER — ALTEPLASE 2 MG IJ SOLR
2.0000 mg | Freq: Once | INTRAMUSCULAR | Status: DC | PRN
  Filled 2022-02-22: qty 2

## 2022-02-22 MED ORDER — SODIUM CHLORIDE 0.9% FLUSH
10.0000 mL | INTRAVENOUS | Status: DC | PRN
  Administered 2022-02-22: 10 mL

## 2022-02-22 MED ORDER — SODIUM CHLORIDE 0.9 % IV SOLN
10.0000 mg | Freq: Once | INTRAVENOUS | Status: AC
  Administered 2022-02-22: 10 mg via INTRAVENOUS
  Filled 2022-02-22: qty 10

## 2022-02-22 MED ORDER — LEUCOVORIN CALCIUM INJECTION 350 MG
400.0000 mg/m2 | Freq: Once | INTRAVENOUS | Status: AC
  Administered 2022-02-22: 908 mg via INTRAVENOUS
  Filled 2022-02-22: qty 45.4

## 2022-02-22 MED ORDER — DEXTROSE 5 % IV SOLN: Freq: Once | INTRAVENOUS | Status: AC

## 2022-02-22 MED ORDER — OXALIPLATIN CHEMO INJECTION 100 MG/20ML
70.0000 mg/m2 | Freq: Once | INTRAVENOUS | Status: AC
  Administered 2022-02-22: 160 mg via INTRAVENOUS
  Filled 2022-02-22: qty 32

## 2022-02-22 MED ORDER — FLUOROURACIL CHEMO INJECTION 2.5 GM/50ML
400.0000 mg/m2 | Freq: Once | INTRAVENOUS | Status: AC
  Administered 2022-02-22: 900 mg via INTRAVENOUS
  Filled 2022-02-22: qty 18

## 2022-02-22 NOTE — Telephone Encounter (Signed)
I called the patient to let her know that no acute cardiopulmonary disease was seen on CXR regarding her gradually worsening dyspnea with exertion. No signs of infection or pulmonary edema, etc. She verbalized understanding with the instructions. She is going to follow up with her PCP of her symptoms continue to reoccur.

## 2022-02-22 NOTE — Patient Instructions (Signed)
[facility]  Discharge Instructions: Thank you for choosing [facility] to provide your oncology and hematology care.   If you have a lab appointment with the Cancer Center, please go directly to the Cancer Center and check in at the registration area.   Wear comfortable clothing and clothing appropriate for easy access to any Portacath or PICC line.   We strive to give you quality time with your provider. You may need to reschedule your appointment if you arrive late (15 or more minutes).  Arriving late affects you and other patients whose appointments are after yours.  Also, if you miss three or more appointments without notifying the office, you may be dismissed from the clinic at the provider's discretion.      For prescription refill requests, have your pharmacy contact our office and allow 72 hours for refills to be completed.    Today you received the following chemotherapy and/or immunotherapy agents: Oxaliplatin, Leucovorin, Fluorouracil.       To help prevent nausea and vomiting after your treatment, we encourage you to take your nausea medication as directed.  BELOW ARE SYMPTOMS THAT SHOULD BE REPORTED IMMEDIATELY: *FEVER GREATER THAN 100.4 F (38 C) OR HIGHER *CHILLS OR SWEATING *NAUSEA AND VOMITING THAT IS NOT CONTROLLED WITH YOUR NAUSEA MEDICATION *UNUSUAL SHORTNESS OF BREATH *UNUSUAL BRUISING OR BLEEDING *URINARY PROBLEMS (pain or burning when urinating, or frequent urination) *BOWEL PROBLEMS (unusual diarrhea, constipation, pain near the anus) TENDERNESS IN MOUTH AND THROAT WITH OR WITHOUT PRESENCE OF ULCERS (sore throat, sores in mouth, or a toothache) UNUSUAL RASH, SWELLING OR PAIN  UNUSUAL VAGINAL DISCHARGE OR ITCHING   Items with * indicate a potential emergency and should be followed up as soon as possible or go to the Emergency Department if any problems should occur.  Please show the CHEMOTHERAPY ALERT CARD or  IMMUNOTHERAPY ALERT CARD at check-in to the Emergency Department and triage nurse.  Should you have questions after your visit or need to cancel or reschedule your appointment, please contact [facility]  Dept: [phone]  and follow the prompts.  Office hours are 8:00 a.m. to 4:30 p.m. Monday - Friday. Please note that voicemails left after 4:00 p.m. may not be returned until the following business day.  We are closed weekends and major holidays. You have access to a nurse at all times for urgent questions. Please call the main number to the clinic Dept: [phone] and follow the prompts.   For any non-urgent questions, you may also contact your provider using MyChart. We now offer e-Visits for anyone 18 and older to request care online for non-urgent symptoms. For details visit [email or web address].   Also download the MyChart app! Go to the app store, search "MyChart", open the app, select , and log in with your MyChart username and password.  Masks are optional in the cancer centers. If you would like for your care team to wear a mask while they are taking care of you, please let them know. You may have one support Steaven Wholey who is at least 74 years old accompany you for your appointments. 

## 2022-02-24 ENCOUNTER — Inpatient Hospital Stay: Payer: Medicare Other

## 2022-02-24 ENCOUNTER — Other Ambulatory Visit: Payer: Self-pay

## 2022-02-24 VITALS — BP 138/46 | HR 66 | Temp 99.2°F | Resp 20

## 2022-02-24 DIAGNOSIS — C182 Malignant neoplasm of ascending colon: Secondary | ICD-10-CM

## 2022-02-24 DIAGNOSIS — Z5111 Encounter for antineoplastic chemotherapy: Secondary | ICD-10-CM | POA: Diagnosis not present

## 2022-02-24 MED ORDER — HEPARIN SOD (PORK) LOCK FLUSH 100 UNIT/ML IV SOLN
500.0000 [IU] | Freq: Once | INTRAVENOUS | Status: AC | PRN
  Administered 2022-02-24: 500 [IU]

## 2022-02-24 MED ORDER — SODIUM CHLORIDE 0.9% FLUSH
10.0000 mL | INTRAVENOUS | Status: DC | PRN
  Administered 2022-02-24: 10 mL

## 2022-02-25 ENCOUNTER — Other Ambulatory Visit: Payer: Self-pay

## 2022-02-25 ENCOUNTER — Other Ambulatory Visit: Payer: Self-pay | Admitting: Hematology

## 2022-02-25 DIAGNOSIS — C182 Malignant neoplasm of ascending colon: Secondary | ICD-10-CM

## 2022-03-04 MED FILL — Dexamethasone Sodium Phosphate Inj 100 MG/10ML: INTRAMUSCULAR | Qty: 1 | Status: AC

## 2022-03-07 ENCOUNTER — Encounter: Payer: Self-pay | Admitting: Hematology

## 2022-03-07 ENCOUNTER — Inpatient Hospital Stay: Payer: Medicare Other

## 2022-03-07 ENCOUNTER — Other Ambulatory Visit: Payer: Self-pay

## 2022-03-07 ENCOUNTER — Inpatient Hospital Stay: Payer: Medicare Other | Attending: Hematology

## 2022-03-07 ENCOUNTER — Inpatient Hospital Stay: Payer: Medicare Other | Admitting: Hematology

## 2022-03-07 VITALS — BP 138/66 | HR 65 | Temp 98.5°F | Resp 18 | Ht 65.0 in | Wt 241.1 lb

## 2022-03-07 DIAGNOSIS — C779 Secondary and unspecified malignant neoplasm of lymph node, unspecified: Secondary | ICD-10-CM | POA: Insufficient documentation

## 2022-03-07 DIAGNOSIS — C786 Secondary malignant neoplasm of retroperitoneum and peritoneum: Secondary | ICD-10-CM | POA: Diagnosis not present

## 2022-03-07 DIAGNOSIS — Z803 Family history of malignant neoplasm of breast: Secondary | ICD-10-CM | POA: Diagnosis not present

## 2022-03-07 DIAGNOSIS — Z79811 Long term (current) use of aromatase inhibitors: Secondary | ICD-10-CM | POA: Diagnosis not present

## 2022-03-07 DIAGNOSIS — E1142 Type 2 diabetes mellitus with diabetic polyneuropathy: Secondary | ICD-10-CM | POA: Insufficient documentation

## 2022-03-07 DIAGNOSIS — Z5111 Encounter for antineoplastic chemotherapy: Secondary | ICD-10-CM | POA: Diagnosis not present

## 2022-03-07 DIAGNOSIS — Z86000 Personal history of in-situ neoplasm of breast: Secondary | ICD-10-CM | POA: Insufficient documentation

## 2022-03-07 DIAGNOSIS — Z95828 Presence of other vascular implants and grafts: Secondary | ICD-10-CM

## 2022-03-07 DIAGNOSIS — E1136 Type 2 diabetes mellitus with diabetic cataract: Secondary | ICD-10-CM | POA: Diagnosis not present

## 2022-03-07 DIAGNOSIS — C182 Malignant neoplasm of ascending colon: Secondary | ICD-10-CM

## 2022-03-07 DIAGNOSIS — E559 Vitamin D deficiency, unspecified: Secondary | ICD-10-CM | POA: Diagnosis not present

## 2022-03-07 DIAGNOSIS — Z808 Family history of malignant neoplasm of other organs or systems: Secondary | ICD-10-CM | POA: Insufficient documentation

## 2022-03-07 DIAGNOSIS — C184 Malignant neoplasm of transverse colon: Secondary | ICD-10-CM | POA: Diagnosis not present

## 2022-03-07 DIAGNOSIS — C7963 Secondary malignant neoplasm of bilateral ovaries: Secondary | ICD-10-CM | POA: Insufficient documentation

## 2022-03-07 DIAGNOSIS — Z800 Family history of malignant neoplasm of digestive organs: Secondary | ICD-10-CM | POA: Diagnosis not present

## 2022-03-07 DIAGNOSIS — I10 Essential (primary) hypertension: Secondary | ICD-10-CM | POA: Diagnosis not present

## 2022-03-07 DIAGNOSIS — Z8049 Family history of malignant neoplasm of other genital organs: Secondary | ICD-10-CM | POA: Insufficient documentation

## 2022-03-07 DIAGNOSIS — M7989 Other specified soft tissue disorders: Secondary | ICD-10-CM | POA: Diagnosis not present

## 2022-03-07 DIAGNOSIS — G620 Drug-induced polyneuropathy: Secondary | ICD-10-CM | POA: Insufficient documentation

## 2022-03-07 LAB — CBC WITH DIFFERENTIAL (CANCER CENTER ONLY)
Abs Immature Granulocytes: 0.02 10*3/uL (ref 0.00–0.07)
Basophils Absolute: 0 10*3/uL (ref 0.0–0.1)
Basophils Relative: 1 %
Eosinophils Absolute: 0.1 10*3/uL (ref 0.0–0.5)
Eosinophils Relative: 2 %
HCT: 32.8 % — ABNORMAL LOW (ref 36.0–46.0)
Hemoglobin: 11.3 g/dL — ABNORMAL LOW (ref 12.0–15.0)
Immature Granulocytes: 0 %
Lymphocytes Relative: 46 %
Lymphs Abs: 2.1 10*3/uL (ref 0.7–4.0)
MCH: 33.9 pg (ref 26.0–34.0)
MCHC: 34.5 g/dL (ref 30.0–36.0)
MCV: 98.5 fL (ref 80.0–100.0)
Monocytes Absolute: 0.5 10*3/uL (ref 0.1–1.0)
Monocytes Relative: 10 %
Neutro Abs: 1.9 10*3/uL (ref 1.7–7.7)
Neutrophils Relative %: 41 %
Platelet Count: 172 10*3/uL (ref 150–400)
RBC: 3.33 MIL/uL — ABNORMAL LOW (ref 3.87–5.11)
RDW: 14.9 % (ref 11.5–15.5)
WBC Count: 4.6 10*3/uL (ref 4.0–10.5)
nRBC: 0.4 % — ABNORMAL HIGH (ref 0.0–0.2)

## 2022-03-07 LAB — CMP (CANCER CENTER ONLY)
ALT: 26 U/L (ref 0–44)
AST: 31 U/L (ref 15–41)
Albumin: 4.1 g/dL (ref 3.5–5.0)
Alkaline Phosphatase: 87 U/L (ref 38–126)
Anion gap: 8 (ref 5–15)
BUN: 15 mg/dL (ref 8–23)
CO2: 26 mmol/L (ref 22–32)
Calcium: 9.5 mg/dL (ref 8.9–10.3)
Chloride: 108 mmol/L (ref 98–111)
Creatinine: 0.78 mg/dL (ref 0.44–1.00)
GFR, Estimated: >60 mL/min (ref >60)
Glucose, Bld: 124 mg/dL — ABNORMAL HIGH (ref 70–99)
Potassium: 3.8 mmol/L (ref 3.5–5.1)
Sodium: 142 mmol/L (ref 135–145)
Total Bilirubin: 1.6 mg/dL — ABNORMAL HIGH (ref 0.3–1.2)
Total Protein: 6.7 g/dL (ref 6.5–8.1)

## 2022-03-07 LAB — CEA (IN HOUSE-CHCC): CEA (CHCC-In House): 39.61 ng/mL — ABNORMAL HIGH (ref 0.00–5.00)

## 2022-03-07 MED ORDER — SODIUM CHLORIDE 0.9 % IV SOLN
10.0000 mg | Freq: Once | INTRAVENOUS | Status: AC
  Administered 2022-03-07: 10 mg via INTRAVENOUS
  Filled 2022-03-07: qty 10

## 2022-03-07 MED ORDER — PALONOSETRON HCL INJECTION 0.25 MG/5ML
0.2500 mg | Freq: Once | INTRAVENOUS | Status: AC
  Administered 2022-03-07: 0.25 mg via INTRAVENOUS
  Filled 2022-03-07: qty 5

## 2022-03-07 MED ORDER — OXALIPLATIN CHEMO INJECTION 100 MG/20ML
70.0000 mg/m2 | Freq: Once | INTRAVENOUS | Status: AC
  Administered 2022-03-07: 160 mg via INTRAVENOUS
  Filled 2022-03-07: qty 32

## 2022-03-07 MED ORDER — SODIUM CHLORIDE 0.9% FLUSH
10.0000 mL | INTRAVENOUS | Status: DC | PRN
  Administered 2022-03-07: 10 mL

## 2022-03-07 MED ORDER — LEUCOVORIN CALCIUM INJECTION 350 MG
400.0000 mg/m2 | Freq: Once | INTRAVENOUS | Status: AC
  Administered 2022-03-07: 908 mg via INTRAVENOUS
  Filled 2022-03-07: qty 45.4

## 2022-03-07 MED ORDER — DEXTROSE 5 % IV SOLN: Freq: Once | INTRAVENOUS | Status: AC

## 2022-03-07 MED ORDER — FLUOROURACIL CHEMO INJECTION 2.5 GM/50ML
400.0000 mg/m2 | Freq: Once | INTRAVENOUS | Status: AC
  Administered 2022-03-07: 900 mg via INTRAVENOUS
  Filled 2022-03-07: qty 18

## 2022-03-07 MED ORDER — SODIUM CHLORIDE 0.9 % IV SOLN
2400.0000 mg/m2 | INTRAVENOUS | Status: DC
  Administered 2022-03-07: 5450 mg via INTRAVENOUS
  Filled 2022-03-07: qty 109

## 2022-03-07 NOTE — Progress Notes (Signed)
Kim Castro   [phone] [phone]) [phone]   Clinic Follow up Note   Patient Care Team: [name], MD as PCP - General (Family Medicine) Rolm Bookbinder, MD as Consulting Physician (General Surgery) Truitt Merle, MD as Consulting Physician (Hematology) Leighton Ruff, MD as Consulting Physician (General Surgery) Nicholas Lose, MD as Consulting Physician (Hematology and Oncology)  Date of Service:  03/07/2022  CHIEF COMPLAINT: f/u of metastatic colon cancer, h/o DCIS   CURRENT THERAPY:  FOLFOX, q14d, restarted 01/24/22  ASSESSMENT & PLAN:  Kim Castro is a 76 y.o. female with   1. Right colon cancer, pT3N2aM0 stage IIB, MSS, metastatic to b/l ovaries and peritoneum, KRAS mutation G12V (+)  -diagnosed in 08/2019, s/p resection with Dr Marcello Moores on 11/01/19. Path showed overall Stage IIIB cancer.  -s/p 6 months adjuvant FOLFOX, completed 06/01/20, oxaliplatin held for final 2 cycles due to neuropathy.  -surveillance colonoscopy on 02/10/21 under Dr. Carlean Purl showed only diverticulosis. Repeat due in 01/2024. -restaging CT CAP 08/26/21 was NED. --restaging PET scan on 11/10/21 showed hypermetabolism to enlarged left ovary, no other evidence of metastatic disease. -s/p total hysterectomy on 12/22/21 with Dr. Berline Lopes. Path revealed metastatic moderately differentiated colonic adenocarcinoma involving both ovaries. Peritoneal deposits were also positive for metastatic mucinous adenocarcinoma, consistent with colorectal primary. -her CEA as also risen, consistent with worsening disease. Most recently up to 22.67 on 01/13/22. -[employer] showed KRAS mutation. She is not eligible for any targeted therapy.  -she restarted FOLFOX on 01/24/22, dose reduced due to mild neuropathy. She is tolerating well. -labs reviewed, stable mild anemia from chemo with hgb 11.3. T.bili is newly elevated at 1.6 today; she denies any new darkening of her urine or other signs of  jaundice. Adequate to proceed with FOLFOX as scheduled. She prefers Monday appointments.    2. Neuropathy, secondary to Oxaliplatin G1 -developed after C7 FOLFOX 03/23/20, oxali ultimately discontinued after 05/05/20. -she is on Lyrica and B vit complex, mild and stable   3. H/o left breast DCIS, G2, ER/PR+ -Dx in 05/2014, s/p left lumpectomy with Dr Excell Seltzer, adjuvant RT with Dr Pablo Ledger, and Anastrozole 08/2014 - 01/13/20 under Dr Lindi Adie.  -DEXA 09/11/20 T score +1.2 normal.  -most recent mammogram on 10/04/21 was negative. -plan to start screening breast MRI in 04/2022.    PLAN:  -proceed with C4 FOLFOX at same dose.  -lab, flush, f/u, and FOLFOX every 2 weeks             -she prefers Monday appointments -restaging CT to be done in 5-6 weeks   No problem-specific Assessment & Plan notes found for this encounter.   SUMMARY OF ONCOLOGIC HISTORY: Oncology History Overview Note   Cancer Staging  Malignant neoplasm of female breast [facility]) Staging form: Breast, AJCC 7th Edition - Clinical stage from 06/11/2014: Stage 0 (Tis (DCIS), N0, M0) - Unsigned Staged by: Pathologist and managing physician Laterality: Left Estrogen receptor status: Positive Progesterone receptor status: Positive Stage used in treatment planning: Yes National guidelines used in treatment planning: Yes Type of national guideline used in treatment planning: NCCN - Pathologic stage from 07/03/2014: Stage Unknown (Tis (DCIS), NX, cM0) - Signed by Enid Cutter, MD on 07/10/2014 Staged by: Pathologist Laterality: Left Estrogen receptor status: Positive Progesterone receptor status: Positive Stage used in treatment planning: Yes National guidelines used in treatment planning: Yes Type of national guideline used in treatment planning: NCCN Staging comments: Staged on final lumpectomy specimen by Dr. Donato Heinz.  Right colon cancer Staging form:  Colon and Rectum, AJCC 8th Edition - Pathologic stage from 11/01/2019: Stage IIIB  (pT3, pN2a, cM0) - Signed by Truitt Merle, MD on 11/29/2019 Stage prefix: Initial diagnosis Histologic grading system: 4 grade system Histologic grade (G): G2 Residual tumor (R): R0 - None Tumor deposits (TD): Absent Perineural invasion (PNI): Absent Microsatellite instability (MSI): Stable KRAS mutation: Unknown NRAS mutation: Unknown BRAF mutation: Unknown     Malignant neoplasm of female breast ([place])  05/29/2014 Initial Biopsy   Left breast needle core biopsy: Grade 2, DCIS with calcs. ER+ (100%), PR+ (96%).    06/04/2014 Initial Diagnosis   Left breast DCIS with calcifications, ER 100%, PR 96%   06/10/2014 Breast MRI   Left breast: 2.4 x 1.3 x 1.1 cm area of patchy non-mass enhancement upper outer quadrant includes postbiopsy seroma; Right breast: 1.2 cm previously biopsied stable benign fibroadenoma   06/12/2014 Procedure   Genetic counseling/testing: Identified 1 VUS on CHEK2 gene. Remainder of 17 gene panel tested negative and included: ATM, BARD1, BRCA 1/2, BRIP1, CDH1, CHEK2, EPCAM, MLH1, MSH2, MSH6, NBN, NF1, PALB2, PTEN, RAD50, RAD51C, RAD51D, STK11, and TP53.    07/01/2014 Surgery   Left breast lumpectomy ([place]): Grade 1, DCIS, spanning 2.3 cm, 1 mm margin, ER 100%, PR 96%   07/31/2014 - 08/28/2014 Radiation Therapy   Adjuvant RT completed Pablo Ledger). Left breast: Total dose 42.5 Gy over 17 fractions. Left breast boost: Total dose 7.5 Gy over 3 fractions.    09/14/2014 - 01/13/2020 Anti-estrogen oral therapy   Anastrazole 88m daily. Planned duration of treatment: 5 years ([place]. Completed in 01/2020.    09/25/2014 Survivorship   Survivorship Care Plan given to patient and reviewed with her in person.    03/02/2021 Imaging   CT CAP  IMPRESSION: 1. No findings of active/recurrent malignancy. Partial right hemicolectomy. 2. Endometrial stripe remains mildly thickened, but endometrial biopsy in May was negative for malignancy. 3. Progressive endplate sclerosis and endplate  irregularity at T2-3, probably due to degenerative endplate findings. If the has referable upper thoracic pain/symptoms then thoracic spine MRI could be used for further workup. 4. Other imaging findings of potential clinical significance: Mild cardiomegaly. Aortic Atherosclerosis (ICD10-I70.0). Mild mitral valve calcification. Postoperative findings in the left breast with adjacent radiation port anteriorly in the left upper lobe. Tiny pulmonary nodules in the left lower lobe are unchanged from earliest available comparison of 10/17/2019 and probably benign although may merit surveillance. Multilevel lumbar impingement. Mild pelvic floor laxity.   Right colon cancer  09/19/2019 Imaging   CT AP W contrast 09/19/19  IMPRESSION Fullness in the cecum, cannot exclude a mass. No evidence for metastatic disease is identified.    09/24/2019 Procedure   Colonoscopy by Dr MEber Jones5/25/21 IMPRESSION 1. The colon was redundant  2. Mild diverticulosis was noted through the entire examined colon 3. Single 13mpolyp was found in the ascending colon; polypectomy was performed using snare cautery and biopsy forceps 4. Mild diverticulosis was notes in the descending colon and sigmoid colon.  5. Single polyp was found in the sigmoid colon, polypectomy was performed with cold forceps.  6. Single polyp was found in the rectosigmoid colon; polypectomy was performed with cold snare  7. Small internal hemorrhoids  8. Large mass was found at the cecum; multiple biopsies of the area were performed using cold forceps; injection (tattooing) was performed distal to the mass.    09/24/2019 Initial Biopsy   INTERPRETATION AND DIAGNOSIS:  A. Cecum, biopsy:  Invasive moderately differentiated adenocarcinoma.  see  comment  B. Polyp @ ascending colon, polypectomy:  Tubular Adenoma  C. Polyp @ sigmoid colon Polypectomy:  hyperplastic polyp.  D. Polyp @ rectosigmoid colon, Polypectomy:  Hyperplastic Polyp       10/16/2019 Imaging   CT Chest IMPRESSION: 1. Multiple small pulmonary nodules measuring 5 mm or less in size in the lungs. These are nonspecific and are typically considered statistically likely benign. However, given the patient's history of primary malignancy, close attention on follow-up studies is recommended to ensure stability. 2. Aortic atherosclerosis, in addition to right coronary artery disease. Assessment for potential risk factor modification, dietary therapy or pharmacologic therapy may be warranted, if clinically indicated. 3. There are calcifications of the aortic valve and mitral annulus. Echocardiographic correlation for evaluation of potential valvular dysfunction may be warranted if clinically indicated. 4. Small hiatal hernia.   Aortic Atherosclerosis (ICD10-I70.0).   11/01/2019 Initial Diagnosis   Colon cancer ([place])   11/01/2019 Surgery   LAPAROSCOPIC PARTIAL COLECTOMY by Dr Marcello Moores and Dr Johney Maine   11/01/2019 Pathology Results   FINAL MICROSCOPIC DIAGNOSIS:   A. COLON, PROXIMAL RIGHT, COLECTOMY:  - Invasive colonic adenocarcinoma, 5 cm.  - Tumor invades through the muscularis propria into pericolonic tissues.   - Margins of resection are not involved.  - Metastatic carcinoma in (5) of (13) lymph nodes.  - See oncology table.    MSI Stable  Mismatch repair normal  MLH1 - Preserved nuclear expression (greater 50% tumor expression) MSH2 - Preserved nuclear expression (greater 50% tumor expression) MSH6 - Preserved nuclear expression (greater 50% tumor expression) PMS2 - Preserved nuclear expression (greater 50% tumor expression)   11/01/2019 Cancer Staging   Staging form: Colon and Rectum, AJCC 8th Edition - Pathologic stage from 11/01/2019: Stage IIIB (pT3, pN2a, cM0) - Signed by Truitt Merle, MD on 11/29/2019   12/10/2019 Procedure   PAC placed 12/10/19   12/17/2019 - 06/01/2020 Chemotherapy   FOLFOX q2weeks starting in 2 weeks starting 12/17/19. Held  01/27/20-02/10/20 due to b/l PE. Oxaliplatin held C11-12 due to neuropathy. Completed on 06/01/20   03/02/2021 Imaging   CT CAP  IMPRESSION: 1. No findings of active/recurrent malignancy. Partial right hemicolectomy. 2. Endometrial stripe remains mildly thickened, but endometrial biopsy in May was negative for malignancy. 3. Progressive endplate sclerosis and endplate irregularity at T2-3, probably due to degenerative endplate findings. If the has referable upper thoracic pain/symptoms then thoracic spine MRI could be used for further workup. 4. Other imaging findings of potential clinical significance: Mild cardiomegaly. Aortic Atherosclerosis (ICD10-I70.0). Mild mitral valve calcification. Postoperative findings in the left breast with adjacent radiation port anteriorly in the left upper lobe. Tiny pulmonary nodules in the left lower lobe are unchanged from earliest available comparison of 10/17/2019 and probably benign although may merit surveillance. Multilevel lumbar impingement. Mild pelvic floor laxity.   08/26/2021 Imaging   EXAM: CT CHEST, ABDOMEN, AND PELVIS WITH CONTRAST  IMPRESSION: 1. Stable examination without new or progressive findings to suggest local recurrence or metastatic disease within the chest, abdomen, or pelvis. 2. Hepatomegaly with hepatic steatosis. 3. Sigmoid colonic diverticulosis without findings of acute diverticulitis. 4. Similar prominent endplate sclerosis and irregularity at T2-T3 is most consistent with Modic type endplate degenerative changes. However, if patient has referable upper thoracic pain consider further workup with thoracic spine MRI. 5. Similar thickening of the endometrial stripe measuring up to 8 mm, which was previously biopsied with results negative for malignancy. 6. Aortic Atherosclerosis (ICD10-I70.0).   11/10/2021 PET scan   IMPRESSION: 1. LEFT  ovary is increased in size and is intensely hypermetabolic. While physiologic  hypermetabolic ovarian tissue is not uncommon, the enlargement and asymmetric activity warrants further evaluation. Consider contrast pelvic MRI vs tissue sampling. 2. No evidence of metastatic colorectal carcinoma otherwise. 3. Post RIGHT hemicolectomy anatomy. 4. Evidence of radiation change in the LEFT upper lobe (remote breast cancer).     12/22/2021 Relapse/Recurrence    FINAL MICROSCOPIC DIAGNOSIS:   A. LEFT OVARY AND FALLOPIAN TUBE, SALPINGO OOPHORECTOMY:  - Metastatic moderately differentiated colonic adenocarcinoma involving left ovary  - Focal ovarian stromal calcification  - Segment of benign fallopian tube   B. UTERUS WITH RIGHT FALLOPIAN TUBE AND OVARY, HYSTERECTOMY AND SALPINGO-OOPHORECTOMY:  - Metastatic moderately differentiated colonic adenocarcinoma involving right ovary  - Focal invasive extensive adenomyosis  - Benign endometrial polyps  - Benign proliferative phase endometrium  - Hydrosalpinx of right fallopian tube  - Focal ovarian stromal calcification   C. PERITONEAL DEPOSITS, ANTERIOR CUL DE SAC, BIOPSY:  - Metastatic mucinous adenocarcinoma, consistent with colorectal primary    COMMENT:  Immunohistochemical stains show that the tumor cells are positive for CK20 and CDX2 while they are negative for CK7 and PAX8, consistent with above interpretation.    01/24/2022 -  Chemotherapy   Patient is on Treatment Plan : COLORECTAL FOLFOX q14d x 3 months        INTERVAL HISTORY:  Kim Castro is here for a follow up of metastatic colon cancer. She was last seen by PA Cassie on 02/22/22. She presents to the clinic alone. She reports she is feeling a little more tired. She explains she takes a 30 min nap. She also reports continued taste change. She feels it is manageable/tolerable. She reports her previous SOB has nearly resolved. She tells me she spent the weekend with her son and grandkids.    All other systems were reviewed with the patient and are  negative.  MEDICAL HISTORY:  Past Medical History:  Diagnosis Date   Aortic atherosclerosis (HCC)    Arthritis    feet, lower back   Basal cell carcinoma    arm   Breast cancer of upper-outer quadrant of left female breast ([place]) 06/04/2014   Cataract    immature on the left   Colon cancer ([place]) 08/2019   Diabetes mellitus without complication (HCC)    Diverticulosis    Dizziness    > 42yr ago;took Antivert    Family history of anesthesia complication    sister slow to wake up with anesthesia   Family history of breast cancer    Family history of colon cancer    Family history of uterine cancer    GERD (gastroesophageal reflux disease)    takes occasional TUMs   History of bronchitis    > 89[place][age]   History of colon polyps    History of hiatal hernia    Small noted on CT   History of pulmonary embolus (PE)    Hypertension    takes Losartan daily and HCTZ   Iron deficiency anemia    Joint pain    Numbness    to toes on each foot   Peripheral neuropathy    feet and toes   Personal history of radiation therapy    Pulmonary nodules    Noted on CT   Radiation 07/31/14-08/28/14   Left Breast 20 fxs   Seasonal allergies    takes Claritin prn   Urinary frequency    Vitamin D deficiency  takes VIt D daily    SURGICAL HISTORY: Past Surgical History:  Procedure Laterality Date   BREAST BIOPSY Bilateral    BREAST LUMPECTOMY Left    BREAST LUMPECTOMY WITH RADIOACTIVE SEED LOCALIZATION Left 07/01/2014   Procedure: LEFT BREAST LUMPECTOMY WITH RADIOACTIVE SEED LOCALIZATION;  Surgeon: Excell Seltzer, MD;  Location: [place];  Service: General;  Laterality: Left;   CATARACT EXTRACTION Right    COLONOSCOPY  09/24/2019   [place]   LAPAROSCOPIC PARTIAL COLECTOMY N/A 11/01/2019   Procedure: LAPAROSCOPIC PARTIAL COLECTOMY;  Surgeon: Leighton Ruff, MD;  Location: [facility];  Service: General;  Laterality: N/A;   POLYPECTOMY     PORTACATH PLACEMENT N/A  12/10/2019   Procedure: INSERTION PORT-A-CATH ULTRASOUND GUIDED IN RIGHT IJ;  Surgeon: Leighton Ruff, MD;  Location: [facility];  Service: General;  Laterality: N/A;   ROBOTIC ASSISTED TOTAL HYSTERECTOMY Bilateral 12/22/2021   Procedure: XI ROBOTIC ASSISTED TOTAL HYSTERECTOMY WITH BILATERAL SALPINGO OOPHORECTOMY;  Surgeon: [name], MD;  Location: [facility];  Service: Gynecology;  Laterality: Bilateral;   TOTAL KNEE ARTHROPLASTY Left 10/24/2012   Procedure: TOTAL KNEE ARTHROPLASTY;  Surgeon: Kerin Salen, MD;  Location: [place];  Service: Orthopedics;  Laterality: Left;   TOTAL KNEE ARTHROPLASTY Right 01/09/2013   Procedure: TOTAL KNEE ARTHROPLASTY;  Surgeon: Kerin Salen, MD;  Location: [place];  Service: Orthopedics;  Laterality: Right;   TUBAL LIGATION      I have reviewed the social history and family history with the patient and they are unchanged from previous note.  ALLERGIES:  is allergic to oxycodone.  MEDICATIONS:  Current Outpatient Medications  Medication Sig Dispense Refill   acetaminophen (TYLENOL) 325 MG tablet Take 650 mg by mouth every 6 (six) hours as needed for moderate pain.     b complex vitamins capsule Take 1 capsule by mouth daily.     Cholecalciferol (VITAMIN D) 2000 UNITS CAPS Take 2,000 Units by mouth daily.      lidocaine-prilocaine (EMLA) cream Apply to affected area once 30 g 3   loratadine (CLARITIN) 10 MG tablet Take 10 mg by mouth daily as needed for allergies.     losartan (COZAAR) 25 MG tablet Take 1 tablet (25 mg total) by mouth daily. 90 tablet 3   metFORMIN (GLUCOPHAGE) 500 MG tablet Take 1 tablet (500 mg total) by mouth in the morning and at bedtime. 180 tablet 3   Multiple Vitamins-Minerals (MULTIVITAMIN WITH MINERALS) tablet Take 1 tablet by mouth daily.     omeprazole (PRILOSEC) 20 MG capsule Take 20 mg by mouth daily before breakfast.      ondansetron (ZOFRAN) 8 MG tablet Take 1 tablet (8 mg total) by mouth every 8 (eight) hours as needed for  nausea or vomiting. Start on the third day after chemotherapy. 30 tablet 1   pravastatin (PRAVACHOL) 10 MG tablet Take 1 tablet (10 mg total) by mouth daily. 90 tablet 3   pregabalin (LYRICA) 25 MG capsule Take 1 capsule by mouth once daily 30 capsule 0   prochlorperazine (COMPAZINE) 10 MG tablet Take 1 tablet (10 mg total) by mouth every 6 (six) hours as needed for nausea or vomiting. 30 tablet 1   No current facility-administered medications for this visit.   Facility-Administered Medications Ordered in Other Visits  Medication Dose Route Frequency Provider Last Rate Last Admin   fluorouracil (ADRUCIL) 5,450 mg in sodium chloride 0.9 % 141 mL chemo infusion  2,400 mg/m2 (Treatment Plan Recorded) Intravenous 1 day or 1  dose Truitt Merle, MD   Infusion Verify at 03/07/22 1616   sodium chloride flush (NS) 0.9 % injection 10 mL  10 mL Intracatheter PRN Truitt Merle, MD   10 mL at 03/07/22 1604    PHYSICAL EXAMINATION: ECOG PERFORMANCE STATUS: 1 - Symptomatic but completely ambulatory  Vitals:   03/07/22 1115  BP: 138/66  Pulse: 65  Resp: 18  Temp: 98.5 F (36.9 C)  SpO2: 98%   Wt Readings from Last 3 Encounters:  03/07/22 241 lb 1.6 oz (109.4 kg)  02/09/22 248 lb (112.5 kg)  02/08/22 250 lb 4 oz (113.5 kg)     GENERAL:alert, no distress and comfortable SKIN: skin color normal, no rashes or significant lesions EYES: normal, Conjunctiva are pink and non-injected, sclera clear  NEURO: alert & oriented x 3 with fluent speech  LABORATORY DATA:  I have reviewed the data as listed    Latest Ref Rng & Units 03/07/2022   10:29 AM 02/22/2022    7:56 AM 02/04/2022   12:56 PM  CBC  WBC 4.0 - 10.5 K/uL 4.6  5.1  5.2   Hemoglobin 12.0 - 15.0 g/dL 11.3  11.4  11.2   Hematocrit 36.0 - 46.0 % 32.8  34.3  33.5   Platelets 150 - 400 K/uL 172  157  223         Latest Ref Rng & Units 03/07/2022   10:29 AM 02/22/2022    7:56 AM 02/09/2022    9:47 AM  CMP  Glucose 70 - 99 mg/dL 124  163  144    BUN 8 - 23 mg/dL 15  22    Creatinine 0.44 - 1.00 mg/dL 0.78  0.80    Sodium 135 - 145 mmol/L 142  142    Potassium 3.5 - 5.1 mmol/L 3.8  3.8    Chloride 98 - 111 mmol/L 108  109    CO2 22 - 32 mmol/L 26  22    Calcium 8.9 - 10.3 mg/dL 9.5  9.6    Total Protein 6.5 - 8.1 g/dL 6.7  6.6    Total Bilirubin 0.3 - 1.2 mg/dL 1.6  1.0    Alkaline Phos 38 - 126 U/L 87  79    AST 15 - 41 U/L 31  27    ALT 0 - 44 U/L 26  21        RADIOGRAPHIC STUDIES: I have personally reviewed the radiological images as listed and agreed with the findings in the report. No results found.    Orders Placed This Encounter  Procedures   CT ABDOMEN PELVIS W CONTRAST    Standing Status:   Future    Standing Expiration Date:   03/08/2023    Order Specific Question:   If indicated for the ordered procedure, I authorize the administration of contrast media per Radiology protocol    Answer:   Yes    Order Specific Question:   Preferred imaging location?    Answer:   [facility]    Order Specific Question:   Is Oral Contrast requested for this exam?    Answer:   Yes, Per Radiology protocol   CBC with Differential ([place] Only)    Standing Status:   Future    Standing Expiration Date:   03/22/2023   CMP ([place] only)    Standing Status:   Future    Standing Expiration Date:   03/22/2023   CBC with Differential ([place] Only)  Standing Status:   Future    Standing Expiration Date:   04/05/2023   CMP ([place] only)    Standing Status:   Future    Standing Expiration Date:   04/05/2023   CBC with Differential ([place] Only)    Standing Status:   Future    Standing Expiration Date:   04/19/2023   CMP ([place] only)    Standing Status:   Future    Standing Expiration Date:   04/19/2023   All questions were answered. The patient knows to call the clinic with any problems, questions or concerns. No barriers to learning was detected. The total time spent in the  appointment was 30 minutes.     Truitt Merle, MD 03/07/2022   I, Wilburn Mylar, am acting as scribe for Truitt Merle, MD.   I have reviewed the above documentation for accuracy and completeness, and I agree with the above.

## 2022-03-07 NOTE — Patient Instructions (Signed)
[facility]  Discharge Instructions: Thank you for choosing [facility] to provide your oncology and hematology care.   If you have a lab appointment with the [facility], please go directly to the [facility] and check in at the registration area.   Wear comfortable clothing and clothing appropriate for easy access to any Portacath or PICC line.   We strive to give you quality time with your provider. You may need to reschedule your appointment if you arrive late (15 or more minutes).  Arriving late affects you and other patients whose appointments are after yours.  Also, if you miss three or more appointments without notifying the office, you may be dismissed from the clinic at the provider's discretion.      For prescription refill requests, have your pharmacy contact our office and allow 72 hours for refills to be completed.    Today you received the following chemotherapy and/or immunotherapy agents Olaxliplatin, Leucovorin, 5FU      To help prevent nausea and vomiting after your treatment, we encourage you to take your nausea medication as directed.  BELOW ARE SYMPTOMS THAT SHOULD BE REPORTED IMMEDIATELY: *FEVER GREATER THAN 100.4 F (38 C) OR HIGHER *CHILLS OR SWEATING *NAUSEA AND VOMITING THAT IS NOT CONTROLLED WITH YOUR NAUSEA MEDICATION *UNUSUAL SHORTNESS OF BREATH *UNUSUAL BRUISING OR BLEEDING *URINARY PROBLEMS (pain or burning when urinating, or frequent urination) *BOWEL PROBLEMS (unusual diarrhea, constipation, pain near the anus) TENDERNESS IN MOUTH AND THROAT WITH OR WITHOUT PRESENCE OF ULCERS (sore throat, sores in mouth, or a toothache) UNUSUAL RASH, SWELLING OR PAIN  UNUSUAL VAGINAL DISCHARGE OR ITCHING   Items with * indicate a potential emergency and should be followed up as soon as possible or go to the Emergency Department if any problems should occur.  Please show the CHEMOTHERAPY ALERT CARD or IMMUNOTHERAPY ALERT  CARD at check-in to the Emergency Department and triage nurse.  Should you have questions after your visit or need to cancel or reschedule your appointment, please contact [facility]  Dept: [phone]  and follow the prompts.  Office hours are 8:00 a.m. to 4:30 p.m. Monday - Friday. Please note that voicemails left after 4:00 p.m. may not be returned until the following business day.  We are closed weekends and major holidays. You have access to a nurse at all times for urgent questions. Please call the main number to the clinic Dept: [phone] and follow the prompts.   For any non-urgent questions, you may also contact your provider using MyChart. We now offer e-Visits for anyone 50 and older to request care online for non-urgent symptoms. For details visit [email or web address].   Also download the MyChart app! Go to the app store, search "MyChart", open the app, select [facility], and log in with your MyChart username and password.  Masks are optional in the cancer centers. If you would like for your care team to wear a mask while they are taking care of you, please let them know. You may have one support person who is at least 74 years old accompany you for your appointments.

## 2022-03-09 ENCOUNTER — Ambulatory Visit: Payer: Medicare Other | Admitting: Hematology

## 2022-03-09 ENCOUNTER — Other Ambulatory Visit: Payer: Medicare Other

## 2022-03-09 ENCOUNTER — Ambulatory Visit: Payer: Medicare Other

## 2022-03-09 ENCOUNTER — Inpatient Hospital Stay: Payer: Medicare Other

## 2022-03-09 ENCOUNTER — Other Ambulatory Visit: Payer: Self-pay

## 2022-03-09 VITALS — BP 142/54 | HR 79 | Temp 99.0°F | Resp 18

## 2022-03-09 DIAGNOSIS — Z5111 Encounter for antineoplastic chemotherapy: Secondary | ICD-10-CM | POA: Diagnosis not present

## 2022-03-09 DIAGNOSIS — C182 Malignant neoplasm of ascending colon: Secondary | ICD-10-CM

## 2022-03-09 MED ORDER — SODIUM CHLORIDE 0.9% FLUSH
10.0000 mL | INTRAVENOUS | Status: DC | PRN
  Administered 2022-03-09: 10 mL

## 2022-03-09 MED ORDER — HEPARIN SOD (PORK) LOCK FLUSH 100 UNIT/ML IV SOLN
500.0000 [IU] | Freq: Once | INTRAVENOUS | Status: AC | PRN
  Administered 2022-03-09: 500 [IU]

## 2022-03-16 ENCOUNTER — Telehealth: Payer: Self-pay | Admitting: Hematology

## 2022-03-16 NOTE — Telephone Encounter (Signed)
Called patient regarding upcoming November and December appointments, patient is notified. 

## 2022-03-18 MED FILL — Dexamethasone Sodium Phosphate Inj 100 MG/10ML: INTRAMUSCULAR | Qty: 1 | Status: AC

## 2022-03-21 ENCOUNTER — Ambulatory Visit: Payer: Medicare Other

## 2022-03-21 ENCOUNTER — Inpatient Hospital Stay: Payer: Medicare Other

## 2022-03-21 ENCOUNTER — Other Ambulatory Visit: Payer: Medicare Other

## 2022-03-21 ENCOUNTER — Other Ambulatory Visit: Payer: Self-pay

## 2022-03-21 ENCOUNTER — Ambulatory Visit (HOSPITAL_BASED_OUTPATIENT_CLINIC_OR_DEPARTMENT_OTHER)
Admission: RE | Admit: 2022-03-21 | Discharge: 2022-03-21 | Disposition: A | Discharge status: Home / Self Care | Payer: Medicare Other | Source: Ambulatory Visit | Attending: Family Medicine | Admitting: Family Medicine

## 2022-03-21 ENCOUNTER — Encounter (HOSPITAL_COMMUNITY): Payer: Medicare Other

## 2022-03-21 ENCOUNTER — Encounter: Payer: Self-pay | Admitting: Adult Health

## 2022-03-21 ENCOUNTER — Ambulatory Visit: Payer: Medicare Other | Admitting: Adult Health

## 2022-03-21 ENCOUNTER — Inpatient Hospital Stay: Payer: Medicare Other | Admitting: Adult Health

## 2022-03-21 VITALS — BP 169/71 | HR 65 | Temp 97.7°F | Resp 18 | Ht 65.0 in | Wt 241.2 lb

## 2022-03-21 DIAGNOSIS — I10 Essential (primary) hypertension: Secondary | ICD-10-CM | POA: Diagnosis not present

## 2022-03-21 DIAGNOSIS — Z800 Family history of malignant neoplasm of digestive organs: Secondary | ICD-10-CM | POA: Diagnosis not present

## 2022-03-21 DIAGNOSIS — R202 Paresthesia of skin: Secondary | ICD-10-CM | POA: Diagnosis not present

## 2022-03-21 DIAGNOSIS — Z86000 Personal history of in-situ neoplasm of breast: Secondary | ICD-10-CM | POA: Diagnosis not present

## 2022-03-21 DIAGNOSIS — C786 Secondary malignant neoplasm of retroperitoneum and peritoneum: Secondary | ICD-10-CM | POA: Diagnosis not present

## 2022-03-21 DIAGNOSIS — C182 Malignant neoplasm of ascending colon: Secondary | ICD-10-CM

## 2022-03-21 DIAGNOSIS — E1136 Type 2 diabetes mellitus with diabetic cataract: Secondary | ICD-10-CM | POA: Diagnosis not present

## 2022-03-21 DIAGNOSIS — M7989 Other specified soft tissue disorders: Secondary | ICD-10-CM

## 2022-03-21 DIAGNOSIS — C779 Secondary and unspecified malignant neoplasm of lymph node, unspecified: Secondary | ICD-10-CM | POA: Diagnosis not present

## 2022-03-21 DIAGNOSIS — E559 Vitamin D deficiency, unspecified: Secondary | ICD-10-CM | POA: Diagnosis not present

## 2022-03-21 DIAGNOSIS — C7963 Secondary malignant neoplasm of bilateral ovaries: Secondary | ICD-10-CM | POA: Diagnosis not present

## 2022-03-21 DIAGNOSIS — R200 Anesthesia of skin: Secondary | ICD-10-CM | POA: Diagnosis not present

## 2022-03-21 DIAGNOSIS — G620 Drug-induced polyneuropathy: Secondary | ICD-10-CM | POA: Diagnosis not present

## 2022-03-21 DIAGNOSIS — C184 Malignant neoplasm of transverse colon: Secondary | ICD-10-CM | POA: Diagnosis not present

## 2022-03-21 DIAGNOSIS — Z79811 Long term (current) use of aromatase inhibitors: Secondary | ICD-10-CM | POA: Diagnosis not present

## 2022-03-21 DIAGNOSIS — Z8049 Family history of malignant neoplasm of other genital organs: Secondary | ICD-10-CM | POA: Diagnosis not present

## 2022-03-21 DIAGNOSIS — Z803 Family history of malignant neoplasm of breast: Secondary | ICD-10-CM | POA: Diagnosis not present

## 2022-03-21 DIAGNOSIS — E1142 Type 2 diabetes mellitus with diabetic polyneuropathy: Secondary | ICD-10-CM | POA: Diagnosis not present

## 2022-03-21 DIAGNOSIS — Z95828 Presence of other vascular implants and grafts: Secondary | ICD-10-CM

## 2022-03-21 DIAGNOSIS — Z5111 Encounter for antineoplastic chemotherapy: Secondary | ICD-10-CM | POA: Diagnosis not present

## 2022-03-21 LAB — CMP (CANCER CENTER ONLY)
ALT: 45 U/L — ABNORMAL HIGH (ref 0–44)
AST: 64 U/L — ABNORMAL HIGH (ref 15–41)
Albumin: 4.1 g/dL (ref 3.5–5.0)
Alkaline Phosphatase: 108 U/L (ref 38–126)
Anion gap: 8 (ref 5–15)
BUN: 18 mg/dL (ref 8–23)
CO2: 25 mmol/L (ref 22–32)
Calcium: 9.2 mg/dL (ref 8.9–10.3)
Chloride: 110 mmol/L (ref 98–111)
Creatinine: 0.63 mg/dL (ref 0.44–1.00)
GFR, Estimated: >60 mL/min (ref >60)
Glucose, Bld: 149 mg/dL — ABNORMAL HIGH (ref 70–99)
Potassium: 4 mmol/L (ref 3.5–5.1)
Sodium: 143 mmol/L (ref 135–145)
Total Bilirubin: 1.1 mg/dL (ref 0.3–1.2)
Total Protein: 6.8 g/dL (ref 6.5–8.1)

## 2022-03-21 LAB — CBC WITH DIFFERENTIAL (CANCER CENTER ONLY)
Abs Immature Granulocytes: 0.01 10*3/uL (ref 0.00–0.07)
Basophils Absolute: 0 10*3/uL (ref 0.0–0.1)
Basophils Relative: 1 %
Eosinophils Absolute: 0.1 10*3/uL (ref 0.0–0.5)
Eosinophils Relative: 2 %
HCT: 32.1 % — ABNORMAL LOW (ref 36.0–46.0)
Hemoglobin: 10.7 g/dL — ABNORMAL LOW (ref 12.0–15.0)
Immature Granulocytes: 0 %
Lymphocytes Relative: 50 %
Lymphs Abs: 2 10*3/uL (ref 0.7–4.0)
MCH: 34 pg (ref 26.0–34.0)
MCHC: 33.3 g/dL (ref 30.0–36.0)
MCV: 101.9 fL — ABNORMAL HIGH (ref 80.0–100.0)
Monocytes Absolute: 0.5 10*3/uL (ref 0.1–1.0)
Monocytes Relative: 12 %
Neutro Abs: 1.3 10*3/uL — ABNORMAL LOW (ref 1.7–7.7)
Neutrophils Relative %: 35 %
Platelet Count: 148 10*3/uL — ABNORMAL LOW (ref 150–400)
RBC: 3.15 MIL/uL — ABNORMAL LOW (ref 3.87–5.11)
RDW: 16.6 % — ABNORMAL HIGH (ref 11.5–15.5)
WBC Count: 3.8 10*3/uL — ABNORMAL LOW (ref 4.0–10.5)
nRBC: 0 % (ref 0.0–0.2)

## 2022-03-21 MED ORDER — SODIUM CHLORIDE 0.9 % IV SOLN
5000.0000 mg | INTRAVENOUS | Status: DC
  Administered 2022-03-21: 5000 mg via INTRAVENOUS
  Filled 2022-03-21: qty 100

## 2022-03-21 MED ORDER — DEXTROSE 5 % IV SOLN: Freq: Once | INTRAVENOUS | Status: AC

## 2022-03-21 MED ORDER — LEUCOVORIN CALCIUM INJECTION 350 MG
400.0000 mg/m2 | Freq: Once | INTRAVENOUS | Status: AC
  Administered 2022-03-21: 908 mg via INTRAVENOUS
  Filled 2022-03-21: qty 45.4

## 2022-03-21 MED ORDER — PREGABALIN 25 MG PO CAPS: 25.0000 mg | ORAL_CAPSULE | Freq: Every day | ORAL | 0 refills | Status: DC

## 2022-03-21 MED ORDER — SODIUM CHLORIDE 0.9 % IV SOLN
10.0000 mg | Freq: Once | INTRAVENOUS | Status: AC
  Administered 2022-03-21: 10 mg via INTRAVENOUS
  Filled 2022-03-21: qty 10

## 2022-03-21 MED ORDER — SODIUM CHLORIDE 0.9% FLUSH
10.0000 mL | INTRAVENOUS | Status: DC | PRN
  Administered 2022-03-21: 10 mL

## 2022-03-21 MED ORDER — OXALIPLATIN CHEMO INJECTION 100 MG/20ML
70.0000 mg/m2 | Freq: Once | INTRAVENOUS | Status: AC
  Administered 2022-03-21: 160 mg via INTRAVENOUS
  Filled 2022-03-21: qty 32

## 2022-03-21 MED ORDER — PALONOSETRON HCL INJECTION 0.25 MG/5ML
0.2500 mg | Freq: Once | INTRAVENOUS | Status: AC
  Administered 2022-03-21: 0.25 mg via INTRAVENOUS
  Filled 2022-03-21: qty 5

## 2022-03-21 MED ORDER — FLUOROURACIL CHEMO INJECTION 2.5 GM/50ML
400.0000 mg/m2 | Freq: Once | INTRAVENOUS | Status: AC
  Administered 2022-03-21: 900 mg via INTRAVENOUS
  Filled 2022-03-21: qty 18

## 2022-03-21 NOTE — Progress Notes (Signed)
Left lower extremity venous duplex has been completed. Preliminary results can be found in CV Proc through chart review.  Results were given to Mickel Baas at [facility] office.  03/21/22 2:03 PM Carlos Levering RVT

## 2022-03-21 NOTE — Progress Notes (Signed)
[facility] Follow up:    Kim Castro, [place] [address]   DIAGNOSIS:  Cancer Staging  Malignant neoplasm of female breast [facility]) Staging form: Breast, AJCC 7th Edition - Clinical stage from 06/11/2014: Stage 0 (Tis (DCIS), N0, M0) - Unsigned Staged by: Pathologist and managing physician Laterality: Left Estrogen receptor status: Positive Progesterone receptor status: Positive Stage used in treatment planning: Yes National guidelines used in treatment planning: Yes Type of national guideline used in treatment planning: NCCN - Pathologic stage from 07/03/2014: Stage Unknown (Tis (DCIS), NX, cM0) - Signed by Enid Cutter, MD on 07/10/2014 Staged by: Pathologist Laterality: Left Estrogen receptor status: Positive Progesterone receptor status: Positive Stage used in treatment planning: Yes National guidelines used in treatment planning: Yes Type of national guideline used in treatment planning: NCCN Staging comments: Staged on final lumpectomy specimen by Dr. Donato Heinz.  Right colon cancer Staging form: Colon and Rectum, AJCC 8th Edition - Pathologic stage from 11/01/2019: Stage IIIB (pT3, pN2a, cM0) - Signed by Truitt Merle, MD on 11/29/2019 Stage prefix: Initial diagnosis Histologic grading system: 4 grade system Histologic grade (G): G2 Residual tumor (R): R0 - None Tumor deposits (TD): Absent Perineural invasion (PNI): Absent Microsatellite instability (MSI): Stable KRAS mutation: Unknown NRAS mutation: Unknown BRAF mutation: Unknown   SUMMARY OF ONCOLOGIC HISTORY: Oncology History Overview Note   Cancer Staging  Malignant neoplasm of female breast [facility]) Staging form: Breast, AJCC 7th Edition - Clinical stage from 06/11/2014: Stage 0 (Tis (DCIS), N0, M0) - Unsigned Staged by: Pathologist and managing physician Laterality: Left Estrogen receptor status: Positive Progesterone receptor status: Positive Stage used in treatment  planning: Yes National guidelines used in treatment planning: Yes Type of national guideline used in treatment planning: NCCN - Pathologic stage from 07/03/2014: Stage Unknown (Tis (DCIS), NX, cM0) - Signed by Enid Cutter, MD on 07/10/2014 Staged by: Pathologist Laterality: Left Estrogen receptor status: Positive Progesterone receptor status: Positive Stage used in treatment planning: Yes National guidelines used in treatment planning: Yes Type of national guideline used in treatment planning: NCCN Staging comments: Staged on final lumpectomy specimen by Dr. Donato Heinz.  Right colon cancer Staging form: Colon and Rectum, AJCC 8th Edition - Pathologic stage from 11/01/2019: Stage IIIB (pT3, pN2a, cM0) - Signed by Truitt Merle, MD on 11/29/2019 Stage prefix: Initial diagnosis Histologic grading system: 4 grade system Histologic grade (G): G2 Residual tumor (R): R0 - None Tumor deposits (TD): Absent Perineural invasion (PNI): Absent Microsatellite instability (MSI): Stable KRAS mutation: Unknown NRAS mutation: Unknown BRAF mutation: Unknown     Malignant neoplasm of female breast ([place])  05/29/2014 Initial Biopsy   Left breast needle core biopsy: Grade 2, DCIS with calcs. ER+ (100%), PR+ (96%).    06/04/2014 Initial Diagnosis   Left breast DCIS with calcifications, ER 100%, PR 96%   06/10/2014 Breast MRI   Left breast: 2.4 x 1.3 x 1.1 cm area of patchy non-mass enhancement upper outer quadrant includes postbiopsy seroma; Right breast: 1.2 cm previously biopsied stable benign fibroadenoma   06/12/2014 Procedure   Genetic counseling/testing: Identified 1 VUS on CHEK2 gene. Remainder of 17 gene panel tested negative and included: ATM, BARD1, BRCA 1/2, BRIP1, CDH1, CHEK2, EPCAM, MLH1, MSH2, MSH6, NBN, NF1, PALB2, PTEN, RAD50, RAD51C, RAD51D, STK11, and TP53.    07/01/2014 Surgery   Left breast lumpectomy ([facility]): Grade 1, DCIS, spanning 2.3 cm, 1 mm margin, ER 100%, PR 96%   07/31/2014 - 08/28/2014  Radiation Therapy  Adjuvant RT completed Pablo Ledger). Left breast: Total dose 42.5 Gy over 17 fractions. Left breast boost: Total dose 7.5 Gy over 3 fractions.    09/14/2014 - 01/13/2020 Anti-estrogen oral therapy   Anastrazole 80m daily. Planned duration of treatment: 5 years ([place]. Completed in 01/2020.    09/25/2014 Survivorship   Survivorship Care Plan given to patient and reviewed with her in person.    03/02/2021 Imaging   CT CAP  IMPRESSION: 1. No findings of active/recurrent malignancy. Partial right hemicolectomy. 2. Endometrial stripe remains mildly thickened, but endometrial biopsy in May was negative for malignancy. 3. Progressive endplate sclerosis and endplate irregularity at T2-3, probably due to degenerative endplate findings. If the has referable upper thoracic pain/symptoms then thoracic spine MRI could be used for further workup. 4. Other imaging findings of potential clinical significance: Mild cardiomegaly. Aortic Atherosclerosis (ICD10-I70.0). Mild mitral valve calcification. Postoperative findings in the left breast with adjacent radiation port anteriorly in the left upper lobe. Tiny pulmonary nodules in the left lower lobe are unchanged from earliest available comparison of 10/17/2019 and probably benign although may merit surveillance. Multilevel lumbar impingement. Mild pelvic floor laxity.   Right colon cancer  09/19/2019 Imaging   CT AP W contrast 09/19/19  IMPRESSION Fullness in the cecum, cannot exclude a mass. No evidence for metastatic disease is identified.    09/24/2019 Procedure   Colonoscopy by Dr MEber Jones5/25/21 IMPRESSION 1. The colon was redundant  2. Mild diverticulosis was noted through the entire examined colon 3. Single 128mpolyp was found in the ascending colon; polypectomy was performed using snare cautery and biopsy forceps 4. Mild diverticulosis was notes in the descending colon and sigmoid colon.  5. Single polyp was found in the  sigmoid colon, polypectomy was performed with cold forceps.  6. Single polyp was found in the rectosigmoid colon; polypectomy was performed with cold snare  7. Small internal hemorrhoids  8. Large mass was found at the cecum; multiple biopsies of the area were performed using cold forceps; injection (tattooing) was performed distal to the mass.    09/24/2019 Initial Biopsy   INTERPRETATION AND DIAGNOSIS:  A. Cecum, biopsy:  Invasive moderately differentiated adenocarcinoma.  see comment  B. Polyp @ ascending colon, polypectomy:  Tubular Adenoma  C. Polyp @ sigmoid colon Polypectomy:  hyperplastic polyp.  D. Polyp @ rectosigmoid colon, Polypectomy:  Hyperplastic Polyp      10/16/2019 Imaging   CT Chest IMPRESSION: 1. Multiple small pulmonary nodules measuring 5 mm or less in size in the lungs. These are nonspecific and are typically considered statistically likely benign. However, given the patient's history of primary malignancy, close attention on follow-up studies is recommended to ensure stability. 2. Aortic atherosclerosis, in addition to right coronary artery disease. Assessment for potential risk factor modification, dietary therapy or pharmacologic therapy may be warranted, if clinically indicated. 3. There are calcifications of the aortic valve and mitral annulus. Echocardiographic correlation for evaluation of potential valvular dysfunction may be warranted if clinically indicated. 4. Small hiatal hernia.   Aortic Atherosclerosis (ICD10-I70.0).   11/01/2019 Initial Diagnosis   [facility] [place]  11/01/2019 Surgery   LAPAROSCOPIC PARTIAL COLECTOMY by Dr ThMarcello Mooresnd Dr GrJohney Maine 11/01/2019 Pathology Results   FINAL MICROSCOPIC DIAGNOSIS:   A. COLON, PROXIMAL RIGHT, COLECTOMY:  - Invasive colonic adenocarcinoma, 5 cm.  - Tumor invades through the muscularis propria into pericolonic tissues.   - Margins of resection are not involved.  - Metastatic carcinoma in (5) of  (13)  lymph nodes.  - See oncology table.    MSI Stable  Mismatch repair normal  MLH1 - Preserved nuclear expression (greater 50% tumor expression) MSH2 - Preserved nuclear expression (greater 50% tumor expression) MSH6 - Preserved nuclear expression (greater 50% tumor expression) PMS2 - Preserved nuclear expression (greater 50% tumor expression)   11/01/2019 Cancer Staging   Staging form: Colon and Rectum, AJCC 8th Edition - Pathologic stage from 11/01/2019: Stage IIIB (pT3, pN2a, cM0) - Signed by Truitt Merle, MD on 11/29/2019   12/10/2019 Procedure   PAC placed 12/10/19   12/17/2019 - 06/01/2020 Chemotherapy   FOLFOX q2weeks starting in 2 weeks starting 12/17/19. Held 01/27/20-02/10/20 due to b/l PE. Oxaliplatin held C11-12 due to neuropathy. Completed on 06/01/20   03/02/2021 Imaging   CT CAP  IMPRESSION: 1. No findings of active/recurrent malignancy. Partial right hemicolectomy. 2. Endometrial stripe remains mildly thickened, but endometrial biopsy in May was negative for malignancy. 3. Progressive endplate sclerosis and endplate irregularity at T2-3, probably due to degenerative endplate findings. If the has referable upper thoracic pain/symptoms then thoracic spine MRI could be used for further workup. 4. Other imaging findings of potential clinical significance: Mild cardiomegaly. Aortic Atherosclerosis (ICD10-I70.0). Mild mitral valve calcification. Postoperative findings in the left breast with adjacent radiation port anteriorly in the left upper lobe. Tiny pulmonary nodules in the left lower lobe are unchanged from earliest available comparison of 10/17/2019 and probably benign although may merit surveillance. Multilevel lumbar impingement. Mild pelvic floor laxity.   08/26/2021 Imaging   EXAM: CT CHEST, ABDOMEN, AND PELVIS WITH CONTRAST  IMPRESSION: 1. Stable examination without new or progressive findings to suggest local recurrence or metastatic disease within the chest,  abdomen, or pelvis. 2. Hepatomegaly with hepatic steatosis. 3. Sigmoid colonic diverticulosis without findings of acute diverticulitis. 4. Similar prominent endplate sclerosis and irregularity at T2-T3 is most consistent with Modic type endplate degenerative changes. However, if patient has referable upper thoracic pain consider further workup with thoracic spine MRI. 5. Similar thickening of the endometrial stripe measuring up to 8 mm, which was previously biopsied with results negative for malignancy. 6. Aortic Atherosclerosis (ICD10-I70.0).   11/10/2021 PET scan   IMPRESSION: 1. LEFT ovary is increased in size and is intensely hypermetabolic. While physiologic hypermetabolic ovarian tissue is not uncommon, the enlargement and asymmetric activity warrants further evaluation. Consider contrast pelvic MRI vs tissue sampling. 2. No evidence of metastatic colorectal carcinoma otherwise. 3. Post RIGHT hemicolectomy anatomy. 4. Evidence of radiation change in the LEFT upper lobe (remote breast cancer).     12/22/2021 Relapse/Recurrence    FINAL MICROSCOPIC DIAGNOSIS:   A. LEFT OVARY AND FALLOPIAN TUBE, SALPINGO OOPHORECTOMY:  - Metastatic moderately differentiated colonic adenocarcinoma involving left ovary  - Focal ovarian stromal calcification  - Segment of benign fallopian tube   B. UTERUS WITH RIGHT FALLOPIAN TUBE AND OVARY, HYSTERECTOMY AND SALPINGO-OOPHORECTOMY:  - Metastatic moderately differentiated colonic adenocarcinoma involving right ovary  - Focal invasive extensive adenomyosis  - Benign endometrial polyps  - Benign proliferative phase endometrium  - Hydrosalpinx of right fallopian tube  - Focal ovarian stromal calcification   C. PERITONEAL DEPOSITS, ANTERIOR CUL DE SAC, BIOPSY:  - Metastatic mucinous adenocarcinoma, consistent with colorectal primary    COMMENT:  Immunohistochemical stains show that the tumor cells are positive for CK20 and CDX2 while they are  negative for CK7 and PAX8, consistent with above interpretation.    01/24/2022 -  Chemotherapy   Patient is on Treatment Plan : COLORECTAL FOLFOX q14d x  3 months       CURRENT THERAPY:FOLFOX every 2 weeks  INTERVAL HISTORY: Shaquanta Harkless 76 y.o. female returns for f/u and evaluation prior to receiving treatment today.  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 She takes Lyrica for this and is requesting a refill today.    She has also noted increased left leg swelling x 1 week.  This is accompanied by pain in the left upper leg as well.     Patient Active Problem List   Diagnosis Date Noted   Metastasis to peritoneal cavity ([place]) 01/12/2022   Nausea without vomiting 12/23/2021   Postoperative state 12/22/2021   Thickened endometrium    Essential hypertension 05/12/2021   Vitamin D deficiency 05/12/2021   Personal history of malignant neoplasm of breast 05/12/2021   Personal history of colonic polyps 05/12/2021   Morbid obesity ([place]) 05/12/2021   Hyperlipidemia 05/12/2021   Hypercalcemia 05/12/2021   Allergic rhinitis 05/12/2021   Knee joint replacement status, bilateral 05/12/2021   Neuropathy, secondary to Oxaliplatin G2 05/12/2021   Aortic atherosclerosis ([place]) 05/12/2021   History of colon cancer, stage III 02/10/2021   Gastroesophageal reflux disease    Type 2 diabetes mellitus with hyperlipidemia ([place])    History of pulmonary embolism 01/27/2020   Port-A-Cath in place 12/30/2019   Right colon cancer 11/01/2019   Genetic testing 07/08/2014   Malignant neoplasm of female breast ([place]) 06/04/2014    is allergic to oxycodone.  MEDICAL HISTORY: Past Medical History:  Diagnosis Date   Aortic atherosclerosis (HCC)    Arthritis    feet, lower back   Basal cell carcinoma    arm   Breast cancer of upper-outer quadrant of left female breast ([place]) 06/04/2014   Cataract    immature on the left   Colon cancer ([place]) 08/2019    Diabetes mellitus without complication (HCC)    Diverticulosis    Dizziness    > 57yr ago;took Antivert    Family history of anesthesia complication    sister slow to wake up with anesthesia   Family history of breast cancer    Family history of colon cancer    Family history of uterine cancer    GERD (gastroesophageal reflux disease)    takes occasional TUMs   History of bronchitis    > 89[place][age]   History of colon polyps    History of hiatal hernia    Small noted on CT   History of pulmonary embolus (PE)    Hypertension    takes Losartan daily and HCTZ   Iron deficiency anemia    Joint pain    Numbness    to toes on each foot   Peripheral neuropathy    feet and toes   Personal history of radiation therapy    Pulmonary nodules    Noted on CT   Radiation 07/31/14-08/28/14   Left Breast 20 fxs   Seasonal allergies    takes Claritin prn   Urinary frequency    Vitamin D deficiency    takes VIt D daily    SURGICAL HISTORY: Past Surgical History:  Procedure Laterality Date   BREAST BIOPSY Bilateral    BREAST LUMPECTOMY Left    BREAST LUMPECTOMY WITH RADIOACTIVE SEED LOCALIZATION Left 07/01/2014   Procedure: LEFT BREAST LUMPECTOMY WITH RADIOACTIVE SEED LOCALIZATION;  Surgeon: BeExcell SeltzerMD;  [place] Service: General;  Laterality: Left;   CATARACT EXTRACTION Right  COLONOSCOPY  09/24/2019   [place]   LAPAROSCOPIC PARTIAL COLECTOMY N/A 11/01/2019   Procedure: LAPAROSCOPIC PARTIAL COLECTOMY;  Surgeon: Leighton Ruff, MD;  Location: [facility];  Service: General;  Laterality: N/A;   POLYPECTOMY     PORTACATH PLACEMENT N/A 12/10/2019   Procedure: INSERTION PORT-A-CATH ULTRASOUND GUIDED IN RIGHT IJ;  Surgeon: Leighton Ruff, MD;  Location: [facility];  Service: General;  Laterality: N/A;   ROBOTIC ASSISTED TOTAL HYSTERECTOMY Bilateral 12/22/2021   Procedure: XI ROBOTIC ASSISTED TOTAL HYSTERECTOMY WITH BILATERAL SALPINGO OOPHORECTOMY;  Surgeon:  [name], MD;  Location: [facility];  Service: Gynecology;  Laterality: Bilateral;   TOTAL KNEE ARTHROPLASTY Left 10/24/2012   Procedure: TOTAL KNEE ARTHROPLASTY;  Surgeon: Kerin Salen, MD;  Location: [place];  Service: Orthopedics;  Laterality: Left;   TOTAL KNEE ARTHROPLASTY Right 01/09/2013   Procedure: TOTAL KNEE ARTHROPLASTY;  Surgeon: Kerin Salen, MD;  Location: [place];  Service: Orthopedics;  Laterality: Right;   TUBAL LIGATION      SOCIAL HISTORY: Social History   Socioeconomic History   Marital status: Married    Spouse name: Not on file   Number of children: 3   Years of education: Not on file   Highest education level: Not on file  Occupational History   Occupation: Retired    Comment: Dispensing optician  Tobacco Use   Smoking status: Never   Smokeless tobacco: Never  Vaping Use   Vaping Use: Never used  Substance and Sexual Activity   Alcohol use: No   Drug use: No   Sexual activity: Yes    Birth control/protection: Surgical, Post-menopausal  Other Topics Concern   Not on file  Social History Narrative   Not on file   Social Determinants of Health   Financial Resource Strain: Low Risk  (09/01/2021)   Overall Financial Resource Strain (CARDIA)    Difficulty of Paying Living Expenses: Not hard at all  Food Insecurity: No Food Insecurity (09/01/2021)   Hunger Vital Sign    Worried About Running Out of Food in the Last Year: Never true    [place] in the Last Year: Never true  Transportation Needs: No Transportation Needs (09/01/2021)   PRAPARE - Hydrologist (Medical): No    Lack of Transportation (Non-Medical): No  Physical Activity: Insufficiently Active (09/01/2021)   Exercise Vital Sign    Days of Exercise per Week: 2 days    Minutes of Exercise per Session: 20 min  Stress: No Stress Concern Present (09/01/2021)   [place]    Feeling of Stress : Not at all   Social Connections: Moderately Isolated (09/01/2021)   Social Connection and Isolation Panel [NHANES]    Frequency of Communication with Friends and Family: Three times a week    Frequency of Social Gatherings with Friends and Family: Three times a week    Attends Religious Services: Never    Active Member of Clubs or Organizations: No    Attends Archivist Meetings: Never    Marital Status: Married  Human resources officer Violence: Not At Risk (09/01/2021)   Humiliation, Afraid, Rape, and Kick questionnaire    Fear of Current or Ex-Partner: No    Emotionally Abused: No    Physically Abused: No    Sexually Abused: No    FAMILY HISTORY: Family History  Problem Relation Age of Onset   Diabetes Mother    Uterine cancer Mother  deceased 16   Hypertension Father    Heart disease Father    Diabetes Sister    Breast cancer Sister 63       currently 58   Hypertension Sister    Diabetes Sister    Breast cancer Sister    Diabetes Sister    Kidney disease Sister    Heart disease Sister    Lupus Sister    Heart disease Sister    Diabetes Sister    Diabetes Sister    Hypertension Brother    Diabetes Brother    Colon cancer Brother 5   Colon polyps Daughter    Thyroid cancer Daughter 48       currently 51; type?   Cancer Maternal Uncle        unk. primary; deceased 22s   Stroke Maternal Grandmother    Esophageal cancer Neg Hx    Rectal cancer Neg Hx    Stomach cancer Neg Hx     Review of Systems  Constitutional:  Positive for fatigue. Negative for appetite change, chills, fever and unexpected weight change.  HENT:   Negative for hearing loss, lump/mass and trouble swallowing.   Eyes:  Negative for eye problems and icterus.  Respiratory:  Negative for chest tightness, cough and shortness of breath.   Cardiovascular:  Positive for leg swelling. Negative for chest pain and palpitations.  Gastrointestinal:  Negative for abdominal distention, abdominal pain,  constipation, diarrhea, nausea and vomiting.  Endocrine: Negative for hot flashes.  Genitourinary:  Negative for difficulty urinating.   Musculoskeletal:  Negative for arthralgias.  Skin:  Negative for itching and rash.  Neurological:  Positive for numbness. Negative for dizziness, extremity weakness and headaches.  Hematological:  Negative for adenopathy. Does not bruise/bleed easily.  Psychiatric/Behavioral:  Negative for depression. The patient is not nervous/anxious.       PHYSICAL EXAMINATION  ECOG PERFORMANCE STATUS: 1 - Symptomatic but completely ambulatory  Vitals:   03/21/22 0841  BP: (!) 169/71  Pulse: 65  Resp: 18  Temp: 97.7 F (36.5 C)  SpO2: 100%    Physical Exam Constitutional:      General: She is not in acute distress.    Appearance: Normal appearance. She is not toxic-appearing.  HENT:     Head: Normocephalic and atraumatic.  Eyes:     General: No scleral icterus. Cardiovascular:     Rate and Rhythm: Normal rate and regular rhythm.     Pulses: Normal pulses.     Heart sounds: Normal heart sounds.  Pulmonary:     Effort: Pulmonary effort is normal.     Breath sounds: Normal breath sounds.  Abdominal:     General: Abdomen is flat. Bowel sounds are normal. There is no distension.     Palpations: Abdomen is soft.     Tenderness: There is no abdominal tenderness.  Musculoskeletal:        General: Swelling (Left leg swelling moderately worse than right.) present.     Cervical back: Neck supple.  Lymphadenopathy:     Cervical: No cervical adenopathy.  Skin:    General: Skin is warm and dry.     Findings: No rash.  Neurological:     General: No focal deficit present.     Mental Status: She is alert.  Psychiatric:        Mood and Affect: Mood normal.        Behavior: Behavior normal.     LABORATORY DATA:  CBC    Component  Value Date/Time   WBC 3.8 (L) 03/21/2022 0812   WBC 5.2 02/04/2022 1256   RBC 3.15 (L) 03/21/2022 0812   HGB 10.7 (L)  03/21/2022 0812   HGB 13.0 10/03/2014 0914   HCT 32.1 (L) 03/21/2022 0812   HCT 38.1 10/03/2014 0914   PLT 148 (L) 03/21/2022 0812   PLT 310 10/03/2014 0914   MCV 101.9 (H) 03/21/2022 0812   MCV 93.9 10/03/2014 0914   MCH 34.0 03/21/2022 0812   MCHC 33.3 03/21/2022 0812   RDW 16.6 (H) 03/21/2022 0812   RDW 13.8 10/03/2014 0914   LYMPHSABS 2.0 03/21/2022 0812   LYMPHSABS 2.0 10/03/2014 0914   MONOABS 0.5 03/21/2022 0812   MONOABS 0.6 10/03/2014 0914   EOSABS 0.1 03/21/2022 0812   EOSABS 0.2 10/03/2014 0914   BASOSABS 0.0 03/21/2022 0812   BASOSABS 0.1 10/03/2014 0914    CMP     Component Value Date/Time   NA 143 03/21/2022 0812   NA 144 10/03/2014 0914   K 4.0 03/21/2022 0812   K 4.6 10/03/2014 0914   CL 110 03/21/2022 0812   CO2 25 03/21/2022 0812   CO2 29 10/03/2014 0914   GLUCOSE 149 (H) 03/21/2022 0812   GLUCOSE 133 10/03/2014 0914   BUN 18 03/21/2022 0812   BUN 17.8 10/03/2014 0914   CREATININE 0.63 03/21/2022 0812   CREATININE 0.8 10/03/2014 0914   CALCIUM 9.2 03/21/2022 0812   CALCIUM 9.7 10/03/2014 0914   PROT 6.8 03/21/2022 0812   PROT 7.0 10/03/2014 0914   ALBUMIN 4.1 03/21/2022 0812   ALBUMIN 3.7 10/03/2014 0914   AST 64 (H) 03/21/2022 0812   AST 18 10/03/2014 0914   ALT 45 (H) 03/21/2022 0812   ALT 13 10/03/2014 0914   ALKPHOS 108 03/21/2022 0812   ALKPHOS 74 10/03/2014 0914   BILITOT 1.1 03/21/2022 0812   BILITOT 0.98 10/03/2014 0914   GFRNONAA >60 03/21/2022 0812   GFRAA >60 01/30/2020 0429   GFRAA >60 01/27/2020 0834       ASSESSMENT and THERAPY PLAN:   Right colon cancer Francena is a 76 year old woman with history of stage IIIb colon cancer on treatment with FOLFOX given every 14 days.  She is due for treatment today.  So far her CBC is stable and she will proceed with treatment so long as her c-Met is within parameters.  She does have mild neuropathy and I refilled her Lyrica for this today.  She is also taking B complex for this.  This  is mild and she will continue on this current treatment plan.  She has new onset left leg swelling.  I have ordered a Doppler to further evaluate.  Mandie will return in 2 weeks for labs, follow-up, and her next treatment.  She has not undergone CEA testing so I placed orders for this to happen with her next visit.  She will also undergo restaging scans with CT chest abdomen and pelvis which is due on December 13.  I asked my nurse to look into making sure this gets scheduled.  All questions were answered. The patient knows to call the clinic with any problems, questions or concerns. We can certainly see the patient much sooner if necessary.  Total encounter time:30 minutes*in face-to-face visit time, chart review, lab review, care coordination, order entry, and documentation of the encounter time.  Wilber Bihari, NP 03/21/22 9:35 AM Medical Oncology and Hematology [facility] [place], [place] [address] Tel. [phone]  Fax. [phone]  *Total Encounter Time as defined by the Centers for Medicare and Medicaid Services includes, in addition to the face-to-face time of a patient visit (documented in the note above) non-face-to-face time: obtaining and reviewing outside history, ordering and reviewing medications, tests or procedures, care coordination (communications with other health care professionals or caregivers) and documentation in the medical record.

## 2022-03-21 NOTE — Assessment & Plan Note (Addendum)
Kim Castro is a 76 year old woman with history of stage IIIb colon cancer on treatment with FOLFOX given every 14 days.  She is due for treatment today.  So far her CBC is stable and she will proceed with treatment so long as her c-Met is within parameters.  She does have mild neuropathy and I refilled her Lyrica for this today.  She is also taking B complex for this.  This is mild and she will continue on this current treatment plan.  She has new onset left leg swelling.  I have ordered a Doppler to further evaluate.  Kim Castro will return in 2 weeks for labs, follow-up, and her next treatment.  She has not undergone CEA testing so I placed orders for this to happen with her next visit.  She will also undergo restaging scans with CT chest abdomen and pelvis which is due on December 13.  I asked my nurse to look into making sure this gets scheduled.

## 2022-03-21 NOTE — Progress Notes (Signed)
Pt scheduled today for doppler to LLE She is scheduled for 2 PM and infusion RN made aware to advise pt. Pt also scheduled for CT abd 04/01/22 at 0930. She will need to arrive at 0700 for check in, and they will give her PO contrast at 0730 and 0830, then she will have the scan at 0930. NPO 4 hr prior. These directions given to Daisy Blossom, RN to convey to pt,

## 2022-03-21 NOTE — Progress Notes (Signed)
Per Mendel Ryder NP, ok to proceed with CBC results today (ANC 1.3)

## 2022-03-22 ENCOUNTER — Ambulatory Visit: Payer: Medicare Other | Admitting: Hematology

## 2022-03-22 ENCOUNTER — Ambulatory Visit: Payer: Medicare Other

## 2022-03-22 ENCOUNTER — Other Ambulatory Visit: Payer: Medicare Other

## 2022-03-23 ENCOUNTER — Inpatient Hospital Stay: Payer: Medicare Other

## 2022-03-23 ENCOUNTER — Other Ambulatory Visit: Payer: Self-pay

## 2022-03-23 VITALS — BP 139/51 | HR 71 | Temp 98.8°F | Resp 20

## 2022-03-23 DIAGNOSIS — C182 Malignant neoplasm of ascending colon: Secondary | ICD-10-CM

## 2022-03-23 DIAGNOSIS — Z5111 Encounter for antineoplastic chemotherapy: Secondary | ICD-10-CM | POA: Diagnosis not present

## 2022-03-23 MED ORDER — HEPARIN SOD (PORK) LOCK FLUSH 100 UNIT/ML IV SOLN
500.0000 [IU] | Freq: Once | INTRAVENOUS | Status: AC | PRN
  Administered 2022-03-23: 500 [IU]

## 2022-03-23 MED ORDER — SODIUM CHLORIDE 0.9% FLUSH
10.0000 mL | INTRAVENOUS | Status: DC | PRN
  Administered 2022-03-23: 10 mL

## 2022-03-28 ENCOUNTER — Encounter: Payer: Self-pay | Admitting: Hematology

## 2022-04-01 ENCOUNTER — Ambulatory Visit (HOSPITAL_COMMUNITY)
Admission: RE | Admit: 2022-04-01 | Discharge: 2022-04-01 | Disposition: A | Discharge status: Home / Self Care | Payer: Medicare Other | Source: Ambulatory Visit | Attending: Hematology | Admitting: Hematology

## 2022-04-01 ENCOUNTER — Other Ambulatory Visit: Payer: Self-pay | Admitting: Hematology

## 2022-04-01 DIAGNOSIS — C189 Malignant neoplasm of colon, unspecified: Secondary | ICD-10-CM | POA: Diagnosis not present

## 2022-04-01 DIAGNOSIS — C786 Secondary malignant neoplasm of retroperitoneum and peritoneum: Secondary | ICD-10-CM | POA: Diagnosis not present

## 2022-04-01 DIAGNOSIS — C182 Malignant neoplasm of ascending colon: Secondary | ICD-10-CM | POA: Diagnosis not present

## 2022-04-01 DIAGNOSIS — K7689 Other specified diseases of liver: Secondary | ICD-10-CM | POA: Diagnosis not present

## 2022-04-01 MED ORDER — IOHEXOL 300 MG/ML  SOLN
100.0000 mL | Freq: Once | INTRAMUSCULAR | Status: AC | PRN
  Administered 2022-04-01: 100 mL via INTRAVENOUS

## 2022-04-01 MED ORDER — SODIUM CHLORIDE (PF) 0.9 % IJ SOLN
INTRAMUSCULAR | Status: AC
  Filled 2022-04-01: qty 50

## 2022-04-01 MED ORDER — IOHEXOL 9 MG/ML PO SOLN
500.0000 mL | ORAL | Status: AC
  Administered 2022-04-01 (×2): 500 mL via ORAL

## 2022-04-01 MED FILL — Dexamethasone Sodium Phosphate Inj 100 MG/10ML: INTRAMUSCULAR | Qty: 1 | Status: AC

## 2022-04-01 NOTE — Progress Notes (Signed)
DISCONTINUE ON PATHWAY REGIMEN - Colorectal     A cycle is every 14 days:     Oxaliplatin      Leucovorin      Fluorouracil      Fluorouracil   **Always confirm dose/schedule in your pharmacy ordering system**  REASON: Disease Progression PRIOR TREATMENT: COS67: [id number] q14 Days x 6 Months TREATMENT RESPONSE: Progressive Disease (PD)  START ON PATHWAY REGIMEN - Colorectal     A cycle is every 14 days:     Bevacizumab-xxxx      Irinotecan      Leucovorin      Fluorouracil      Fluorouracil   **Always confirm dose/schedule in your pharmacy ordering system**  Patient Characteristics: Distant Metastases, Nonsurgical Candidate, KRAS/NRAS Mutation Positive/Unknown (BRAF V600 Wild-Type/Unknown), Standard Cytotoxic Therapy, Second Line Standard Cytotoxic Therapy, Bevacizumab Eligible Tumor Location: Colon Therapeutic Status: Distant Metastases Microsatellite/Mismatch Repair Status: MSS/pMMR BRAF Mutation Status: Wild-Type (no mutation) KRAS/NRAS Mutation Status: Mutation Positive Preferred Therapy Approach: Standard Cytotoxic Therapy Standard Cytotoxic Line of Therapy: Second Line Standard Cytotoxic Therapy Bevacizumab Eligibility: Eligible Intent of Therapy: Non-Curative / Palliative Intent, Discussed with Patient

## 2022-04-03 ENCOUNTER — Other Ambulatory Visit: Payer: Self-pay | Admitting: Nurse Practitioner

## 2022-04-03 DIAGNOSIS — C182 Malignant neoplasm of ascending colon: Secondary | ICD-10-CM

## 2022-04-03 NOTE — Progress Notes (Unsigned)
Kim Castro   [phone] Fax:([phone]) [phone]   Clinic Follow up Note   Patient Care Team: [name], MD as PCP - General (Family Medicine) Rolm Bookbinder, MD as Consulting Physician (General Surgery) Truitt Merle, MD as Consulting Physician (Hematology) Leighton Ruff, MD as Consulting Physician (General Surgery) Nicholas Lose, MD as Consulting Physician (Hematology and Oncology) 04/04/2022  CHIEF COMPLAINT: Follow up right colon cancer and h/o DCIS  SUMMARY OF ONCOLOGIC HISTORY: Oncology History Overview Note   Cancer Staging  Malignant neoplasm of female breast [facility]) Staging form: Breast, AJCC 7th Edition - Clinical stage from 06/11/2014: Stage 0 (Tis (DCIS), N0, M0) - Unsigned Staged by: Pathologist and managing physician Laterality: Left Estrogen receptor status: Positive Progesterone receptor status: Positive Stage used in treatment planning: Yes National guidelines used in treatment planning: Yes Type of national guideline used in treatment planning: NCCN - Pathologic stage from 07/03/2014: Stage Unknown (Tis (DCIS), NX, cM0) - Signed by Enid Cutter, MD on 07/10/2014 Staged by: Pathologist Laterality: Left Estrogen receptor status: Positive Progesterone receptor status: Positive Stage used in treatment planning: Yes National guidelines used in treatment planning: Yes Type of national guideline used in treatment planning: NCCN Staging comments: Staged on final lumpectomy specimen by Dr. Donato Heinz.  Right colon cancer Staging form: Colon and Rectum, AJCC 8th Edition - Pathologic stage from 11/01/2019: Stage IIIB (pT3, pN2a, cM0) - Signed by Truitt Merle, MD on 11/29/2019 Stage prefix: Initial diagnosis Histologic grading system: 4 grade system Histologic grade (G): G2 Residual tumor (R): R0 - None Tumor deposits (TD): Absent Perineural invasion (PNI): Absent Microsatellite instability (MSI): Stable KRAS mutation: Unknown NRAS mutation:  Unknown BRAF mutation: Unknown     Malignant neoplasm of female breast ([place])  05/29/2014 Initial Biopsy   Left breast needle core biopsy: Grade 2, DCIS with calcs. ER+ (100%), PR+ (96%).    06/04/2014 Initial Diagnosis   Left breast DCIS with calcifications, ER 100%, PR 96%   06/10/2014 Breast MRI   Left breast: 2.4 x 1.3 x 1.1 cm area of patchy non-mass enhancement upper outer quadrant includes postbiopsy seroma; Right breast: 1.2 cm previously biopsied stable benign fibroadenoma   06/12/2014 Procedure   Genetic counseling/testing: Identified 1 VUS on CHEK2 gene. Remainder of 17 gene panel tested negative and included: ATM, BARD1, BRCA 1/2, BRIP1, CDH1, CHEK2, EPCAM, MLH1, MSH2, MSH6, NBN, NF1, PALB2, PTEN, RAD50, RAD51C, RAD51D, STK11, and TP53.    07/01/2014 Surgery   Left breast lumpectomy ([facility]): Grade 1, DCIS, spanning 2.3 cm, 1 mm margin, ER 100%, PR 96%   07/31/2014 - 08/28/2014 Radiation Therapy   Adjuvant RT completed Pablo Ledger). Left breast: Total dose 42.5 Gy over 17 fractions. Left breast boost: Total dose 7.5 Gy over 3 fractions.    09/14/2014 - 01/13/2020 Anti-estrogen oral therapy   Anastrazole 45m daily. Planned duration of treatment: 5 years ([place]. Completed in 01/2020.    09/25/2014 Survivorship   Survivorship Care Plan given to patient and reviewed with her in person.    03/02/2021 Imaging   CT CAP  IMPRESSION: 1. No findings of active/recurrent malignancy. Partial right hemicolectomy. 2. Endometrial stripe remains mildly thickened, but endometrial biopsy in May was negative for malignancy. 3. Progressive endplate sclerosis and endplate irregularity at T2-3, probably due to degenerative endplate findings. If the has referable upper thoracic pain/symptoms then thoracic spine MRI could be used for further workup. 4. Other imaging findings of potential clinical significance: Mild cardiomegaly. Aortic Atherosclerosis (ICD10-I70.0). Mild mitral valve calcification.  Postoperative findings  in the left breast with adjacent radiation port anteriorly in the left upper lobe. Tiny pulmonary nodules in the left lower lobe are unchanged from earliest available comparison of 10/17/2019 and probably benign although may merit surveillance. Multilevel lumbar impingement. Mild pelvic floor laxity.   Right colon cancer  09/19/2019 Imaging   CT AP W contrast 09/19/19  IMPRESSION Fullness in the cecum, cannot exclude a mass. No evidence for metastatic disease is identified.    09/24/2019 Procedure   Colonoscopy by Dr Eber Jones 09/24/19 IMPRESSION 1. The colon was redundant  2. Mild diverticulosis was noted through the entire examined colon 3. Single 68m polyp was found in the ascending colon; polypectomy was performed using snare cautery and biopsy forceps 4. Mild diverticulosis was notes in the descending colon and sigmoid colon.  5. Single polyp was found in the sigmoid colon, polypectomy was performed with cold forceps.  6. Single polyp was found in the rectosigmoid colon; polypectomy was performed with cold snare  7. Small internal hemorrhoids  8. Large mass was found at the cecum; multiple biopsies of the area were performed using cold forceps; injection (tattooing) was performed distal to the mass.    09/24/2019 Initial Biopsy   INTERPRETATION AND DIAGNOSIS:  A. Cecum, biopsy:  Invasive moderately differentiated adenocarcinoma.  see comment  B. Polyp @ ascending colon, polypectomy:  Tubular Adenoma  C. Polyp @ sigmoid colon Polypectomy:  hyperplastic polyp.  D. Polyp @ rectosigmoid colon, Polypectomy:  Hyperplastic Polyp      10/16/2019 Imaging   CT Chest IMPRESSION: 1. Multiple small pulmonary nodules measuring 5 mm or less in size in the lungs. These are nonspecific and are typically considered statistically likely benign. However, given the patient's history of primary malignancy, close attention on follow-up studies is recommended to ensure  stability. 2. Aortic atherosclerosis, in addition to right coronary artery disease. Assessment for potential risk factor modification, dietary therapy or pharmacologic therapy may be warranted, if clinically indicated. 3. There are calcifications of the aortic valve and mitral annulus. Echocardiographic correlation for evaluation of potential valvular dysfunction may be warranted if clinically indicated. 4. Small hiatal hernia.   Aortic Atherosclerosis (ICD10-I70.0).   11/01/2019 Initial Diagnosis   Colon cancer ([facility]   11/01/2019 Surgery   LAPAROSCOPIC PARTIAL COLECTOMY by Dr TMarcello Mooresand Dr GJohney Maine  11/01/2019 Pathology Results   FINAL MICROSCOPIC DIAGNOSIS:   A. COLON, PROXIMAL RIGHT, COLECTOMY:  - Invasive colonic adenocarcinoma, 5 cm.  - Tumor invades through the muscularis propria into pericolonic tissues.   - Margins of resection are not involved.  - Metastatic carcinoma in (5) of (13) lymph nodes.  - See oncology table.    MSI Stable  Mismatch repair normal  MLH1 - Preserved nuclear expression (greater 50% tumor expression) MSH2 - Preserved nuclear expression (greater 50% tumor expression) MSH6 - Preserved nuclear expression (greater 50% tumor expression) PMS2 - Preserved nuclear expression (greater 50% tumor expression)   11/01/2019 Cancer Staging   Staging form: Colon and Rectum, AJCC 8th Edition - Pathologic stage from 11/01/2019: Stage IIIB (pT3, pN2a, cM0) - Signed by FTruitt Merle MD on 11/29/2019   12/10/2019 Procedure   PAC placed 12/10/19   12/17/2019 - 06/01/2020 Chemotherapy   FOLFOX q2weeks starting in 2 weeks starting 12/17/19. Held 01/27/20-02/10/20 due to b/l PE. Oxaliplatin held C11-12 due to neuropathy. Completed on 06/01/20   03/02/2021 Imaging   CT CAP  IMPRESSION: 1. No findings of active/recurrent malignancy. Partial right hemicolectomy. 2. Endometrial stripe remains mildly thickened, but endometrial  biopsy in May was negative for malignancy. 3. Progressive  endplate sclerosis and endplate irregularity at T2-3, probably due to degenerative endplate findings. If the has referable upper thoracic pain/symptoms then thoracic spine MRI could be used for further workup. 4. Other imaging findings of potential clinical significance: Mild cardiomegaly. Aortic Atherosclerosis (ICD10-I70.0). Mild mitral valve calcification. Postoperative findings in the left breast with adjacent radiation port anteriorly in the left upper lobe. Tiny pulmonary nodules in the left lower lobe are unchanged from earliest available comparison of 10/17/2019 and probably benign although may merit surveillance. Multilevel lumbar impingement. Mild pelvic floor laxity.   08/26/2021 Imaging   EXAM: CT CHEST, ABDOMEN, AND PELVIS WITH CONTRAST  IMPRESSION: 1. Stable examination without new or progressive findings to suggest local recurrence or metastatic disease within the chest, abdomen, or pelvis. 2. Hepatomegaly with hepatic steatosis. 3. Sigmoid colonic diverticulosis without findings of acute diverticulitis. 4. Similar prominent endplate sclerosis and irregularity at T2-T3 is most consistent with Modic type endplate degenerative changes. However, if patient has referable upper thoracic pain consider further workup with thoracic spine MRI. 5. Similar thickening of the endometrial stripe measuring up to 8 mm, which was previously biopsied with results negative for malignancy. 6. Aortic Atherosclerosis (ICD10-I70.0).   11/10/2021 PET scan   IMPRESSION: 1. LEFT ovary is increased in size and is intensely hypermetabolic. While physiologic hypermetabolic ovarian tissue is not uncommon, the enlargement and asymmetric activity warrants further evaluation. Consider contrast pelvic MRI vs tissue sampling. 2. No evidence of metastatic colorectal carcinoma otherwise. 3. Post RIGHT hemicolectomy anatomy. 4. Evidence of radiation change in the LEFT upper lobe (remote breast cancer).      12/22/2021 Relapse/Recurrence    FINAL MICROSCOPIC DIAGNOSIS:   A. LEFT OVARY AND FALLOPIAN TUBE, SALPINGO OOPHORECTOMY:  - Metastatic moderately differentiated colonic adenocarcinoma involving left ovary  - Focal ovarian stromal calcification  - Segment of benign fallopian tube   B. UTERUS WITH RIGHT FALLOPIAN TUBE AND OVARY, HYSTERECTOMY AND SALPINGO-OOPHORECTOMY:  - Metastatic moderately differentiated colonic adenocarcinoma involving right ovary  - Focal invasive extensive adenomyosis  - Benign endometrial polyps  - Benign proliferative phase endometrium  - Hydrosalpinx of right fallopian tube  - Focal ovarian stromal calcification   C. PERITONEAL DEPOSITS, ANTERIOR CUL DE SAC, BIOPSY:  - Metastatic mucinous adenocarcinoma, consistent with colorectal primary    COMMENT:  Immunohistochemical stains show that the tumor cells are positive for CK20 and CDX2 while they are negative for CK7 and PAX8, consistent with above interpretation.    01/24/2022 - 03/23/2022 Chemotherapy   Patient is on Treatment Plan : COLORECTAL FOLFOX q14d x 3 months     04/01/2022 Imaging   CT AP IMPRESSION: 1. New omental metastatic disease. 2. Vague hypoattenuating lesion in the inferior right hepatic lobe is new and also worrisome for metastatic disease. 3. Similar small right lower lobe nodules. Recommend continued attention on follow-up. 4. Steatotic enlarged liver. 5.  Aortic atherosclerosis (ICD10-I70.0).   04/05/2022 -  Chemotherapy   Patient is on Treatment Plan : COLORECTAL FOLFIRI + Bevacizumab q14d       CURRENT THERAPY:  FOLFOX q14 days, restarted 01/24/22 PENDING second line FOLFIRI/beva starting ~04/04/22   INTERVAL HISTORY: Kim Castro returns for follow up as scheduled. Last seen by Dr. Burr Medico 03/07/22 and by my colleague Thedore Mins, NP 11/20 with cycle 5 FOLFOX.  For a few days she has fatigue and feels "unwell" without nausea or vomiting then recovers.  After treatment she drove to  [place] by herself  to see her sister who is sick. At baseline she has 3 loose bowel movements daily, except diarrhea after CT contrast.  She took 1 Imodium and this resolved.  Eating and drinking well with good energy level, she continues to do her yard work and remains independent/active.  She has mild cold sensitivity but no significant neuropathy.  She started using leg massaging boots which helped the numbness in her toes as well as minor leg swelling.  She has mild exertional dyspnea which has been stable throughout chemo, no new cough, chest pain.  Denies abdominal pain/bloating, recent fever, chills, or any other new specific complaints.  All other systems were reviewed with the patient and are negative.  MEDICAL HISTORY:  Past Medical History:  Diagnosis Date   Aortic atherosclerosis (HCC)    Arthritis    feet, lower back   Basal cell carcinoma    arm   Breast cancer of upper-outer quadrant of left female breast ([place]) 06/04/2014   Cataract    immature on the left   Colon cancer ([place]) 08/2019   Diabetes mellitus without complication (HCC)    Diverticulosis    Dizziness    > 65yr ago;took Antivert    Family history of anesthesia complication    sister slow to wake up with anesthesia   Family history of breast cancer    Family history of colon cancer    Family history of uterine cancer    GERD (gastroesophageal reflux disease)    takes occasional TUMs   History of bronchitis    > 89[place][age]   History of colon polyps    History of hiatal hernia    Small noted on CT   History of pulmonary embolus (PE)    Hypertension    takes Losartan daily and HCTZ   Iron deficiency anemia    Joint pain    Numbness    to toes on each foot   Peripheral neuropathy    feet and toes   Personal history of radiation therapy    Pulmonary nodules    Noted on CT   Radiation 07/31/14-08/28/14   Left Breast 20 fxs   Seasonal allergies    takes Claritin prn   Urinary frequency    Vitamin D  deficiency    takes VIt D daily    SURGICAL HISTORY: Past Surgical History:  Procedure Laterality Date   BREAST BIOPSY Bilateral    BREAST LUMPECTOMY Left    BREAST LUMPECTOMY WITH RADIOACTIVE SEED LOCALIZATION Left 07/01/2014   Procedure: LEFT BREAST LUMPECTOMY WITH RADIOACTIVE SEED LOCALIZATION;  Surgeon: BeExcell SeltzerMD;  [facility] Service: General;  Laterality: Left;   CATARACT EXTRACTION Right    COLONOSCOPY  09/24/2019   [place]   LAPAROSCOPIC PARTIAL COLECTOMY N/A 11/01/2019   Procedure: LAPAROSCOPIC PARTIAL COLECTOMY;  Surgeon: ThLeighton RuffMD;  Location: [facility];  Service: General;  Laterality: N/A;   POLYPECTOMY     PORTACATH PLACEMENT N/A 12/10/2019   Procedure: INSERTION PORT-A-CATH ULTRASOUND GUIDED IN RIGHT IJ;  Surgeon: ThLeighton RuffMD;  Location: [facility];  Service: General;  Laterality: N/A;   ROBOTIC ASSISTED TOTAL HYSTERECTOMY Bilateral 12/22/2021   Procedure: XI ROBOTIC ASSISTED TOTAL HYSTERECTOMY WITH BILATERAL SALPINGO OOPHORECTOMY;  Surgeon: TuLafonda MossesMD;  Location: [facility];  Service: Gynecology;  Laterality: Bilateral;   TOTAL KNEE ARTHROPLASTY Left 10/24/2012   Procedure: TOTAL KNEE ARTHROPLASTY;  Surgeon: FrKerin SalenMD;  [facility] Service: [facility]ics;  Laterality:  Left;   TOTAL KNEE ARTHROPLASTY Right 01/09/2013   Procedure: TOTAL KNEE ARTHROPLASTY;  Surgeon: Kerin Salen, MD;  Location: [facility];  Service: Orthopedics;  Laterality: Right;   TUBAL LIGATION      I have reviewed the social history and family history with the patient and they are unchanged from previous note.  ALLERGIES:  is allergic to oxycodone.  MEDICATIONS:  Current Outpatient Medications  Medication Sig Dispense Refill   acetaminophen (TYLENOL) 325 MG tablet Take 650 mg by mouth every 6 (six) hours as needed for moderate pain.     b complex vitamins capsule Take 1 capsule by mouth daily.     Cholecalciferol (VITAMIN D) 2000 UNITS  CAPS Take 2,000 Units by mouth daily.      Ibuprofen 200 MG CAPS Take by mouth.     loratadine (CLARITIN) 10 MG tablet Take 10 mg by mouth daily as needed for allergies.     losartan (COZAAR) 25 MG tablet Take 1 tablet (25 mg total) by mouth daily. 90 tablet 3   metFORMIN (GLUCOPHAGE) 500 MG tablet Take 1 tablet (500 mg total) by mouth in the morning and at bedtime. 180 tablet 3   Multiple Vitamins-Minerals (MULTIVITAMIN WITH MINERALS) tablet Take 1 tablet by mouth daily.     omeprazole (PRILOSEC) 20 MG capsule Take 20 mg by mouth daily before breakfast.      pravastatin (PRAVACHOL) 10 MG tablet Take 1 tablet (10 mg total) by mouth daily. 90 tablet 3   pregabalin (LYRICA) 25 MG capsule Take 1 capsule (25 mg total) by mouth daily. 30 capsule 0   No current facility-administered medications for this visit.    PHYSICAL EXAMINATION: ECOG PERFORMANCE STATUS: 1 - Symptomatic but completely ambulatory  Vitals:   04/04/22 0936  BP: (!) 142/61  Pulse: 68  Resp: 18  Temp: 100 F (37.8 C)  SpO2: 99%   Filed Weights   04/04/22 0936  Weight: 239 lb 3.2 oz (108.5 kg)    GENERAL:alert, no distress and comfortable SKIN: No rash EYES: sclera clear LUNGS: clear with normal breathing effort HEART: regular rate & rhythm, trace bilateral lower extremity edema ABDOMEN:abdomen soft, non-tender and normal bowel sounds Musculoskeletal:no cyanosis of digits and no clubbing  NEURO: alert & oriented x 3 with fluent speech, nonfocal PAC without erythema  LABORATORY DATA:  I have reviewed the data as listed    Latest Ref Rng & Units 04/04/2022    9:18 AM 03/21/2022    8:12 AM 03/07/2022   10:29 AM  CBC  WBC 4.0 - 10.5 K/uL 3.6  3.8  4.6   Hemoglobin 12.0 - 15.0 g/dL 10.1  10.7  11.3   Hematocrit 36.0 - 46.0 % 30.7  32.1  32.8   Platelets 150 - 400 K/uL 138  148  172         Latest Ref Rng & Units 04/04/2022    9:18 AM 03/21/2022    8:12 AM 03/07/2022   10:29 AM  CMP  Glucose 70 - 99 mg/dL  149  149  124   BUN 8 - 23 mg/dL _0 Creatinine 0.44 - 1.00 mg/dL 0.69  0.63  0.78   Sodium 135 - 145 mmol/L 143  143  142   Potassium 3.5 - 5.1 mmol/L 3.6  4.0  3.8   Chloride 98 - 111 mmol/L 112  110  108   CO2 22 - 32 mmol/L 23  25  26  Calcium 8.9 - 10.3 mg/dL 9.4  9.2  9.5   Total Protein 6.5 - 8.1 g/dL 6.5  6.8  6.7   Total Bilirubin 0.3 - 1.2 mg/dL 1.1  1.1  1.6   Alkaline Phos 38 - 126 U/L 98  108  87   AST 15 - 41 U/L 44  64  31   ALT 0 - 44 U/L 32  45  26       RADIOGRAPHIC STUDIES: I have personally reviewed the radiological images as listed and agreed with the findings in the report. No results found.   ASSESSMENT & PLAN:  Kim Castro is a 76 y.o. female with    1. Right colon cancer, pT3N2aM0 stage IIB, MSS -Diagnosed in 08/2019 with cecal mass biopsy showed moderately differentiated adenocarcinoma. CT chest from 10/16/19 did shows multiple small lung nodules that were nonspecific but likely benign. Will monitor.  -S/p surgery with Dr Marcello Moores on 11/01/19. Path showed overall Stage IIIB cancer.  -She received 6 months of FOLFOX to reduce her risk of recurrence, completed 05/2020. She is currently on surveillance.  -surveillance colonoscopy on 02/10/21 under Dr. Carlean Purl showed only diverticulosis. Repeat due in 01/2024. -restaging CT CAP 08/26/21 was NED. No definitive evidence of cancer recurrence.  The endplate sclerosis and irregularity at T2-3 is similar, she endorses neck pain . -Due to rising CEA she underwent a PET scan 11/10/2021 which showed hypermetabolism and enlargement to the left ovary, no other evidence of metastatic disease  -S/p total hysterectomy 12/22/2021 by Dr. Berline Lopes, path revealed metastatic moderately differentiated colonic adenocarcinoma involving both ovaries with peritoneal deposits also positive for metastatic mucinous adenocarcinoma consistent with colorectal primary -[facility] showed K-ras mutation, unfortunately she is not eligible  for any targeted therapy -She restarted FOLFOX 01/24/2022, dose reduced for neuropathy.  She completed 5 cycles, tolerated very well with mild fatigue and cold sensitivity.  Side effects are adequately managed with supportive care at home.  She is able to recover and function well.  Clinically she is doing well but CEA continues to rise -We reviewed her restaging CT AP which unfortunately shows new omental metastasis and a vague 14 mm lesion in the right hepatic lobe which is difficult to see. This is indeterminate and will be followed. Pt seen with Dr. Burr Medico who recommends to stop FOLFOX and move to second line FOLFIRI and bevacizumab - -Chemotherapy consent: Side effects of FOLFIRI/bevacizumab including but not limited to fatigue, nausea, vomiting, diarrhea, hair loss, neuropathy, fluid retention, renal and liver dysfunction, neutropenic fever, need for blood transfusion, bleeding, and thrombosis were discussed with patient in great detail. She agrees to proceed.  The goal remains palliative -Dr. Burr Medico also reviewed the potential for HIPEC if she responds well to chemo and is a candidate.  -Labs reviewed. ANC 1.1; the plan is to dose-reduce irinotecan and add [place] pending insurance approval. Chemo moved to 12/7 to give time for the approval process.  -Pt agrees with the plan, she did not want to postpone chemo to next week -F/up in 2 weeks with cycle 2   2. Neuropathy, secondary to Oxaliplatin G2 -S/p C7 she started having numbness in hands, L>R which become prolonged with tingling s/p C10.  -Oxaliplatin dose reduced and ultimately held with C11-12.  -improved, now minor tingling in fingertips, numbness in toes; no pain -continue Lyrica and B vit complex   3. H/o left breast DCIS, G2, ER/PR+ -Dx in 05/2014. S/p left lumpectomy with Dr Excell Seltzer, adjuvant RT with  Dr Pablo Ledger, and adjuvant Anastrozole 08/2014-01/13/20 under the care of Dr Lindi Adie.  -DEXA 09/11/20 T score +1.2 normal.  -Mammogram  10/04/2021 was negative -No hypermetabolism in the breast on recent PET to suggest recurrence    4. Submassive bilateral pulmonary embolism, LE Edema  -S/p C3 chemo, her 01/27/20 CTA showed PE with right heart strain. She also has bilateral lower extremity edema and Doppler ultrasound was negative for DVT bilaterally.  -She was treated with 6 months of Eliquis, Lasix and oral potassium BID for LE Edema. -She has mild recurrent lower extremity edema, left lower extremity Doppler 11/20 was negative -She is using leg massaging boots which helps -Pt driving back and forth to [place] to see her sick sister, we reviewed thrombosis risk with prolonged travel.    5. Comorbidities: Arthritis, DM, HTN, GERD, neck and back pain with sciatica -she switched to Dr. Gena Fray for primary care in 05/2021. -Chronic back pain is stable, no new pain   PLAN: -CT 12/1 and today's labs reviewed -Stop FOLFOX due to disease progression -Begin FOLFIRI/Beva ~12/7, with dose-reduced Irinotecan and GCSF for neutropenia -F/up in 2 weeks with cycle 2 -Pt seen with Dr. Burr Medico    Orders Placed This Encounter  Procedures   CT CHEST WO CONTRAST    Standing Status:   Future    Standing Expiration Date:   04/05/2023    Order Specific Question:   Preferred imaging location?    Answer:   [facility]    Order Specific Question:   Release to patient    Answer:   Immediate   MR LIVER W WO CONTRAST    Evaluate the new liver lesion on recent CT scan    Standing Status:   Future    Standing Expiration Date:   04/05/2023    Order Specific Question:   If indicated for the ordered procedure, I authorize the administration of contrast media per Radiology protocol    Answer:   Yes    Order Specific Question:   What is the patient's sedation requirement?    Answer:   No Sedation    Order Specific Question:   Does the patient have a pacemaker or implanted devices?    Answer:   No    Order Specific Question:   Preferred imaging  location?    Answer:   [facility] (table limit - 550 lbs)   CBC with Differential ([place] Only)    Standing Status:   Future    Standing Expiration Date:   04/06/2023   CMP ([place] only)    Standing Status:   Future    Standing Expiration Date:   04/06/2023   Total Protein, Urine dipstick    Standing Status:   Future    Standing Expiration Date:   04/06/2023   CBC with Differential ([place] Only)    Standing Status:   Future    Standing Expiration Date:   04/19/2023   CMP ([place] only)    Standing Status:   Future    Standing Expiration Date:   04/19/2023   CBC with Differential ([place] Only)    Standing Status:   Future    Standing Expiration Date:   05/04/2023   CMP ([place] only)    Standing Status:   Future    Standing Expiration Date:   05/04/2023   Total Protein, Urine dipstick    Standing Status:   Future    Standing Expiration Date:  05/04/2023   All questions were answered. The patient knows to call the clinic with any problems, questions or concerns. No barriers to learning were detected.      Truitt Merle, MD 04/04/22   Addendum  I have seen the patient, examined her. I agree with the assessment and and plan and have edited the notes.   I personally reviewed reviewed her restaging CT scan images, and discussed the findings with patient.  Unfortunately she has had radiographic progression in peritoneum, she also has an indeterminate new liver lesion.  I recommend a liver MRI for further evaluation.  Will also get a CT chest for restaging.  I recommended changing her chemotherapy from FOLFOX to FOLFIRI, will add bevacizumab to her treatment.  Potential benefit and side effects were discussed with her in detail, she agrees to proceed.  Due to her neutropenia, will postpone her first cycle chemo for 1-2 days, with dose reduction on irinotecan, and add GCSF on day 3. All questions were answered.   Truitt Merle  04/04/2022

## 2022-04-04 ENCOUNTER — Other Ambulatory Visit: Payer: Self-pay

## 2022-04-04 ENCOUNTER — Inpatient Hospital Stay: Payer: Medicare Other | Attending: Hematology

## 2022-04-04 ENCOUNTER — Inpatient Hospital Stay: Payer: Medicare Other

## 2022-04-04 ENCOUNTER — Encounter: Payer: Self-pay | Admitting: Nurse Practitioner

## 2022-04-04 ENCOUNTER — Inpatient Hospital Stay: Payer: Medicare Other | Admitting: Nurse Practitioner

## 2022-04-04 VITALS — BP 142/61 | HR 68 | Temp 100.0°F | Resp 18 | Wt 239.2 lb

## 2022-04-04 DIAGNOSIS — E1142 Type 2 diabetes mellitus with diabetic polyneuropathy: Secondary | ICD-10-CM | POA: Insufficient documentation

## 2022-04-04 DIAGNOSIS — C786 Secondary malignant neoplasm of retroperitoneum and peritoneum: Secondary | ICD-10-CM | POA: Diagnosis not present

## 2022-04-04 DIAGNOSIS — Z5111 Encounter for antineoplastic chemotherapy: Secondary | ICD-10-CM | POA: Diagnosis present

## 2022-04-04 DIAGNOSIS — Z90722 Acquired absence of ovaries, bilateral: Secondary | ICD-10-CM | POA: Diagnosis not present

## 2022-04-04 DIAGNOSIS — I10 Essential (primary) hypertension: Secondary | ICD-10-CM | POA: Diagnosis not present

## 2022-04-04 DIAGNOSIS — E1136 Type 2 diabetes mellitus with diabetic cataract: Secondary | ICD-10-CM | POA: Diagnosis not present

## 2022-04-04 DIAGNOSIS — Z90710 Acquired absence of both cervix and uterus: Secondary | ICD-10-CM | POA: Insufficient documentation

## 2022-04-04 DIAGNOSIS — Z803 Family history of malignant neoplasm of breast: Secondary | ICD-10-CM | POA: Insufficient documentation

## 2022-04-04 DIAGNOSIS — Z5189 Encounter for other specified aftercare: Secondary | ICD-10-CM | POA: Diagnosis not present

## 2022-04-04 DIAGNOSIS — Z800 Family history of malignant neoplasm of digestive organs: Secondary | ICD-10-CM | POA: Insufficient documentation

## 2022-04-04 DIAGNOSIS — Z170 Estrogen receptor positive status [ER+]: Secondary | ICD-10-CM | POA: Diagnosis not present

## 2022-04-04 DIAGNOSIS — C50919 Malignant neoplasm of unspecified site of unspecified female breast: Secondary | ICD-10-CM | POA: Diagnosis not present

## 2022-04-04 DIAGNOSIS — Z95828 Presence of other vascular implants and grafts: Secondary | ICD-10-CM

## 2022-04-04 DIAGNOSIS — Z86711 Personal history of pulmonary embolism: Secondary | ICD-10-CM | POA: Insufficient documentation

## 2022-04-04 DIAGNOSIS — Z86000 Personal history of in-situ neoplasm of breast: Secondary | ICD-10-CM | POA: Insufficient documentation

## 2022-04-04 DIAGNOSIS — C182 Malignant neoplasm of ascending colon: Secondary | ICD-10-CM | POA: Insufficient documentation

## 2022-04-04 DIAGNOSIS — Z8049 Family history of malignant neoplasm of other genital organs: Secondary | ICD-10-CM | POA: Insufficient documentation

## 2022-04-04 DIAGNOSIS — Z923 Personal history of irradiation: Secondary | ICD-10-CM | POA: Diagnosis not present

## 2022-04-04 DIAGNOSIS — C7963 Secondary malignant neoplasm of bilateral ovaries: Secondary | ICD-10-CM | POA: Insufficient documentation

## 2022-04-04 LAB — CMP (CANCER CENTER ONLY)
ALT: 32 U/L (ref 0–44)
AST: 44 U/L — ABNORMAL HIGH (ref 15–41)
Albumin: 4 g/dL (ref 3.5–5.0)
Alkaline Phosphatase: 98 U/L (ref 38–126)
Anion gap: 8 (ref 5–15)
BUN: 19 mg/dL (ref 8–23)
CO2: 23 mmol/L (ref 22–32)
Calcium: 9.4 mg/dL (ref 8.9–10.3)
Chloride: 112 mmol/L — ABNORMAL HIGH (ref 98–111)
Creatinine: 0.69 mg/dL (ref 0.44–1.00)
GFR, Estimated: >60 mL/min (ref >60)
Glucose, Bld: 149 mg/dL — ABNORMAL HIGH (ref 70–99)
Potassium: 3.6 mmol/L (ref 3.5–5.1)
Sodium: 143 mmol/L (ref 135–145)
Total Bilirubin: 1.1 mg/dL (ref 0.3–1.2)
Total Protein: 6.5 g/dL (ref 6.5–8.1)

## 2022-04-04 LAB — CBC WITH DIFFERENTIAL (CANCER CENTER ONLY)
Abs Immature Granulocytes: 0.02 10*3/uL (ref 0.00–0.07)
Basophils Absolute: 0 10*3/uL (ref 0.0–0.1)
Basophils Relative: 1 %
Eosinophils Absolute: 0.1 10*3/uL (ref 0.0–0.5)
Eosinophils Relative: 1 %
HCT: 30.7 % — ABNORMAL LOW (ref 36.0–46.0)
Hemoglobin: 10.1 g/dL — ABNORMAL LOW (ref 12.0–15.0)
Immature Granulocytes: 1 %
Lymphocytes Relative: 56 %
Lymphs Abs: 2 10*3/uL (ref 0.7–4.0)
MCH: 34.6 pg — ABNORMAL HIGH (ref 26.0–34.0)
MCHC: 32.9 g/dL (ref 30.0–36.0)
MCV: 105.1 fL — ABNORMAL HIGH (ref 80.0–100.0)
Monocytes Absolute: 0.4 10*3/uL (ref 0.1–1.0)
Monocytes Relative: 10 %
Neutro Abs: 1.1 10*3/uL — ABNORMAL LOW (ref 1.7–7.7)
Neutrophils Relative %: 31 %
Platelet Count: 138 10*3/uL — ABNORMAL LOW (ref 150–400)
RBC: 2.92 MIL/uL — ABNORMAL LOW (ref 3.87–5.11)
RDW: 18.3 % — ABNORMAL HIGH (ref 11.5–15.5)
WBC Count: 3.6 10*3/uL — ABNORMAL LOW (ref 4.0–10.5)
nRBC: 0 % (ref 0.0–0.2)

## 2022-04-04 MED ORDER — SODIUM CHLORIDE 0.9% FLUSH
10.0000 mL | INTRAVENOUS | Status: DC | PRN
  Administered 2022-04-04: 10 mL

## 2022-04-05 ENCOUNTER — Telehealth: Payer: Self-pay | Admitting: Hematology

## 2022-04-05 NOTE — Telephone Encounter (Signed)
Spoke with patient confirming upcoming appointments  

## 2022-04-06 ENCOUNTER — Other Ambulatory Visit: Payer: Self-pay

## 2022-04-06 ENCOUNTER — Inpatient Hospital Stay: Payer: Medicare Other

## 2022-04-06 VITALS — BP 131/63 | HR 80 | Temp 97.8°F | Resp 18

## 2022-04-06 DIAGNOSIS — C182 Malignant neoplasm of ascending colon: Secondary | ICD-10-CM

## 2022-04-06 DIAGNOSIS — Z5111 Encounter for antineoplastic chemotherapy: Secondary | ICD-10-CM | POA: Diagnosis not present

## 2022-04-06 LAB — TOTAL PROTEIN, URINE DIPSTICK: Protein, ur: NEGATIVE mg/dL

## 2022-04-06 MED ORDER — SODIUM CHLORIDE 0.9 % IV SOLN
2242.0000 mg/m2 | INTRAVENOUS | Status: DC
  Administered 2022-04-06: 5000 mg via INTRAVENOUS
  Filled 2022-04-06: qty 100

## 2022-04-06 MED ORDER — SODIUM CHLORIDE 0.9 % IV SOLN
10.0000 mg | Freq: Once | INTRAVENOUS | Status: AC
  Administered 2022-04-06: 10 mg via INTRAVENOUS
  Filled 2022-04-06: qty 10

## 2022-04-06 MED ORDER — SODIUM CHLORIDE 0.9 % IV SOLN
150.0000 mg/m2 | Freq: Once | INTRAVENOUS | Status: AC
  Administered 2022-04-06: 340 mg via INTRAVENOUS
  Filled 2022-04-06: qty 15

## 2022-04-06 MED ORDER — SODIUM CHLORIDE 0.9 % IV SOLN: Freq: Once | INTRAVENOUS | Status: AC

## 2022-04-06 MED ORDER — PALONOSETRON HCL INJECTION 0.25 MG/5ML
0.2500 mg | Freq: Once | INTRAVENOUS | Status: AC
  Administered 2022-04-06: 0.25 mg via INTRAVENOUS
  Filled 2022-04-06: qty 5

## 2022-04-06 MED ORDER — SODIUM CHLORIDE 0.9% FLUSH
10.0000 mL | INTRAVENOUS | Status: DC | PRN
  Administered 2022-04-06: 10 mL

## 2022-04-06 MED ORDER — SODIUM CHLORIDE 0.9 % IV SOLN
400.0000 mg/m2 | Freq: Once | INTRAVENOUS | Status: AC
  Administered 2022-04-06: 896 mg via INTRAVENOUS
  Filled 2022-04-06: qty 44.8

## 2022-04-06 MED ORDER — HEPARIN SOD (PORK) LOCK FLUSH 100 UNIT/ML IV SOLN: 500.0000 [IU] | Freq: Once | INTRAVENOUS | Status: DC | PRN

## 2022-04-06 MED ORDER — ATROPINE SULFATE 1 MG/ML IV SOLN
0.5000 mg | Freq: Once | INTRAVENOUS | Status: AC | PRN
  Administered 2022-04-06: 0.5 mg via INTRAVENOUS
  Filled 2022-04-06: qty 1

## 2022-04-06 MED ORDER — FLUOROURACIL CHEMO INJECTION 2.5 GM/50ML
400.0000 mg/m2 | Freq: Once | INTRAVENOUS | Status: AC
  Administered 2022-04-06: 900 mg via INTRAVENOUS
  Filled 2022-04-06: qty 18

## 2022-04-06 MED ORDER — SODIUM CHLORIDE 0.9 % IV SOLN
5.0000 mg/kg | Freq: Once | INTRAVENOUS | Status: AC
  Administered 2022-04-06: 500 mg via INTRAVENOUS
  Filled 2022-04-06: qty 16

## 2022-04-06 NOTE — Patient Instructions (Signed)
[facility]  Discharge Instructions: Thank you for choosing [facility] to provide your oncology and hematology care.   If you have a lab appointment with the [facility], please go directly to the [facility] and check in at the registration area.   Wear comfortable clothing and clothing appropriate for easy access to any Portacath or PICC line.   We strive to give you quality time with your provider. You may need to reschedule your appointment if you arrive late (15 or more minutes).  Arriving late affects you and other patients whose appointments are after yours.  Also, if you miss three or more appointments without notifying the office, you may be dismissed from the clinic at the provider's discretion.      For prescription refill requests, have your pharmacy contact our office and allow 72 hours for refills to be completed.    Today you received the following chemotherapy and/or immunotherapy agents Mvasi, Irinotecan, Leucovorin, 5FU      To help prevent nausea and vomiting after your treatment, we encourage you to take your nausea medication as directed.  BELOW ARE SYMPTOMS THAT SHOULD BE REPORTED IMMEDIATELY: *FEVER GREATER THAN 100.4 F (38 C) OR HIGHER *CHILLS OR SWEATING *NAUSEA AND VOMITING THAT IS NOT CONTROLLED WITH YOUR NAUSEA MEDICATION *UNUSUAL SHORTNESS OF BREATH *UNUSUAL BRUISING OR BLEEDING *URINARY PROBLEMS (pain or burning when urinating, or frequent urination) *BOWEL PROBLEMS (unusual diarrhea, constipation, pain near the anus) TENDERNESS IN MOUTH AND THROAT WITH OR WITHOUT PRESENCE OF ULCERS (sore throat, sores in mouth, or a toothache) UNUSUAL RASH, SWELLING OR PAIN  UNUSUAL VAGINAL DISCHARGE OR ITCHING   Items with * indicate a potential emergency and should be followed up as soon as possible or go to the Emergency Department if any problems should occur.  Please show the CHEMOTHERAPY ALERT CARD or IMMUNOTHERAPY  ALERT CARD at check-in to the Emergency Department and triage nurse.  Should you have questions after your visit or need to cancel or reschedule your appointment, please contact [facility]  Dept: [phone]  and follow the prompts.  Office hours are 8:00 a.m. to 4:30 p.m. Monday - Friday. Please note that voicemails left after 4:00 p.m. may not be returned until the following business day.  We are closed weekends and major holidays. You have access to a nurse at all times for urgent questions. Please call the main number to the clinic Dept: [phone] and follow the prompts.   For any non-urgent questions, you may also contact your provider using MyChart. We now offer e-Visits for anyone 93 and older to request care online for non-urgent symptoms. For details visit [email or web address].   Also download the MyChart app! Go to the app store, search "MyChart", open the app, select [facility], and log in with your MyChart username and password.  Masks are optional in the cancer centers. If you would like for your care team to wear a mask while they are taking care of you, please let them know. You may have one support person who is at least 74 years old accompany you for your appointments.  Bevacizumab Injection What is this medication? BEVACIZUMAB (be va SIZ yoo mab) treats some types of cancer. It works by blocking a protein that causes cancer cells to grow and multiply. This helps to slow or stop the spread of cancer cells. It is a monoclonal antibody. This medicine may be used for other purposes; ask your health  care provider or pharmacist if you have questions. COMMON BRAND NAME(S): Alymsys, Avastin, MVASI, Noah Charon What should I tell my care team before I take this medication? They need to know if you have any of these conditions: Blood clots Coughing up blood Having or recent surgery Heart failure High blood pressure History of a connection between 2 or  more body parts that do not usually connect (fistula) History of a tear in your stomach or intestines Protein in your urine An unusual or allergic reaction to bevacizumab, other medications, foods, dyes, or preservatives Pregnant or trying to get pregnant Breast-feeding How should I use this medication? This medication is injected into a vein. It is given by your care team in a hospital or clinic setting. Talk to your care team the use of this medication in children. Special care may be needed. Overdosage: If you think you have taken too much of this medicine contact a poison control center or emergency room at once. NOTE: This medicine is only for you. Do not share this medicine with others. What if I miss a dose? Keep appointments for follow-up doses. It is important not to miss your dose. Call your care team if you are unable to keep an appointment. What may interact with this medication? Interactions are not expected. This list may not describe all possible interactions. Give your health care provider a list of all the medicines, herbs, non-prescription drugs, or dietary supplements you use. Also tell them if you smoke, drink alcohol, or use illegal drugs. Some items may interact with your medicine. What should I watch for while using this medication? Your condition will be monitored carefully while you are receiving this medication. You may need blood work while taking this medication. This medication may make you feel generally unwell. This is not uncommon as chemotherapy can affect healthy cells as well as cancer cells. Report any side effects. Continue your course of treatment even though you feel ill unless your care team tells you to stop. This medication may increase your risk to bruise or bleed. Call your care team if you notice any unusual bleeding. Before having surgery, talk to your care team to make sure it is ok. This medication can increase the risk of poor healing of your  surgical site or wound. You will need to stop this medication for 28 days before surgery. After surgery, wait at least 28 days before restarting this medication. Make sure the surgical site or wound is healed enough before restarting this medication. Talk to your care team if questions. Talk to your care team if you may be pregnant. Serious birth defects can occur if you take this medication during pregnancy and for 6 months after the last dose. Contraception is recommended while taking this medication and for 6 months after the last dose. Your care team can help you find the option that works for you. Do not breastfeed while taking this medication and for 6 months after the last dose. This medication can cause infertility. Talk to your care team if you are concerned about your fertility. What side effects may I notice from receiving this medication? Side effects that you should report to your care team as soon as possible: Allergic reactions--skin rash, itching, hives, swelling of the face, lips, tongue, or throat Bleeding--bloody or black, tar-like stools, vomiting blood or brown material that looks like coffee grounds, red or dark brown urine, small red or purple spots on skin, unusual bruising or bleeding Blood clot--pain, swelling, or warmth  in the leg, shortness of breath, chest pain Heart attack--pain or tightness in the chest, shoulders, arms, or jaw, nausea, shortness of breath, cold or clammy skin, feeling faint or lightheaded Heart failure--shortness of breath, swelling of the ankles, feet, or hands, sudden weight gain, unusual weakness or fatigue Increase in blood pressure Infection--fever, chills, cough, sore throat, wounds that don't heal, pain or trouble when passing urine, general feeling of discomfort or being unwell Infusion reactions--chest pain, shortness of breath or trouble breathing, feeling faint or lightheaded Kidney injury--decrease in the amount of urine, swelling of the  ankles, hands, or feet Stomach pain that is severe, does not go away, or gets worse Stroke--sudden numbness or weakness of the face, arm, or leg, trouble speaking, confusion, trouble walking, loss of balance or coordination, dizziness, severe headache, change in vision Sudden and severe headache, confusion, change in vision, seizures, which may be signs of posterior reversible encephalopathy syndrome (PRES) Side effects that usually do not require medical attention (report to your care team if they continue or are bothersome): Back pain Change in taste Diarrhea Dry skin Increased tears Nosebleed This list may not describe all possible side effects. Call your doctor for medical advice about side effects. You may report side effects to FDA at 1-800-FDA-1088. Where should I keep my medication? This medication is given in a hospital or clinic. It will not be stored at home. NOTE: This sheet is a summary. It may not cover all possible information. If you have questions about this medicine, talk to your doctor, pharmacist, or health care provider.  2023 Elsevier/Gold Standard (2021-08-20 00:00:00)  Irinotecan Injection What is this medication? IRINOTECAN (ir in oh TEE kan) treats some types of cancer. It works by slowing down the growth of cancer cells. This medicine may be used for other purposes; ask your health care provider or pharmacist if you have questions. COMMON BRAND NAME(S): Camptosar What should I tell my care team before I take this medication? They need to know if you have any of these conditions: Dehydration Diarrhea Infection, especially a viral infection, such as chickenpox, cold sores, herpes Liver disease Low blood cell levels (white cells, red cells, and platelets) Low levels of electrolytes, such as calcium, magnesium, or potassium in your blood Recent or ongoing radiation An unusual or allergic reaction to irinotecan, other medications, foods, dyes, or  preservatives If you or your partner are pregnant or trying to get pregnant Breast-feeding How should I use this medication? This medication is injected into a vein. It is given by your care team in a hospital or clinic setting. Talk to your care team about the use of this medication in children. Special care may be needed. Overdosage: If you think you have taken too much of this medicine contact a poison control center or emergency room at once. NOTE: This medicine is only for you. Do not share this medicine with others. What if I miss a dose? Keep appointments for follow-up doses. It is important not to miss your dose. Call your care team if you are unable to keep an appointment. What may interact with this medication? Do not take this medication with any of the following: Cobicistat Itraconazole This medication may also interact with the following: Certain antibiotics, such as clarithromycin, rifampin, rifabutin Certain antivirals for HIV or AIDS Certain medications for fungal infections, such as ketoconazole, posaconazole, voriconazole Certain medications for seizures, such as carbamazepine, phenobarbital, phenytoin Gemfibrozil Nefazodone St. John's wort This list may not describe all possible interactions.  Give your health care provider a list of all the medicines, herbs, non-prescription drugs, or dietary supplements you use. Also tell them if you smoke, drink alcohol, or use illegal drugs. Some items may interact with your medicine. What should I watch for while using this medication? Your condition will be monitored carefully while you are receiving this medication. You may need blood work while taking this medication. This medication may make you feel generally unwell. This is not uncommon as chemotherapy can affect healthy cells as well as cancer cells. Report any side effects. Continue your course of treatment even though you feel ill unless your care team tells you to stop. This  medication can cause serious side effects. To reduce the risk, your care team may give you other medications to take before receiving this one. Be sure to follow the directions from your care team. This medication may affect your coordination, reaction time, or judgement. Do not drive or operate machinery until you know how this medication affects you. Sit up or stand slowly to reduce the risk of dizzy or fainting spells. Drinking alcohol with this medication can increase the risk of these side effects. This medication may increase your risk of getting an infection. Call your care team for advice if you get a fever, chills, sore throat, or other symptoms of a cold or flu. Do not treat yourself. Try to avoid being around people who are sick. Avoid taking medications that contain aspirin, acetaminophen, ibuprofen, naproxen, or ketoprofen unless instructed by your care team. These medications may hide a fever. This medication may increase your risk to bruise or bleed. Call your care team if you notice any unusual bleeding. Be careful brushing or flossing your teeth or using a toothpick because you may get an infection or bleed more easily. If you have any dental work done, tell your dentist you are receiving this medication. Talk to your care team if you or your partner are pregnant or think either of you might be pregnant. This medication can cause serious birth defects if taken during pregnancy and for 6 months after the last dose. You will need a negative pregnancy test before starting this medication. Contraception is recommended while taking this medication and for 6 months after the last dose. Your care team can help you find the option that works for you. Do not father a child while taking this medication and for 3 months after the last dose. Use a condom for contraception during this time period. Do not breastfeed while taking this medication and for 7 days after the last dose. This medication may  cause infertility. Talk to your care team if you are concerned about your fertility. What side effects may I notice from receiving this medication? Side effects that you should report to your care team as soon as possible: Allergic reactions--skin rash, itching, hives, swelling of the face, lips, tongue, or throat Dry cough, shortness of breath or trouble breathing Increased saliva or tears, increased sweating, stomach cramping, diarrhea, small pupils, unusual weakness or fatigue, slow heartbeat Infection--fever, chills, cough, sore throat, wounds that don't heal, pain or trouble when passing urine, general feeling of discomfort or being unwell Kidney injury--decrease in the amount of urine, swelling of the ankles, hands, or feet Low red blood cell level--unusual weakness or fatigue, dizziness, headache, trouble breathing Severe or prolonged diarrhea Unusual bruising or bleeding Side effects that usually do not require medical attention (report to your care team if they continue or are bothersome): Constipation  Diarrhea Hair loss Loss of appetite Nausea Stomach pain This list may not describe all possible side effects. Call your doctor for medical advice about side effects. You may report side effects to FDA at 1-800-FDA-1088. Where should I keep my medication? This medication is given in a hospital or clinic. It will not be stored at home. NOTE: This sheet is a summary. It may not cover all possible information. If you have questions about this medicine, talk to your doctor, pharmacist, or health care provider.  2023 Elsevier/Gold Standard (2021-08-26 00:00:00)   Leucovorin Injection What is this medication? LEUCOVORIN (loo koe VOR in) prevents side effects from certain medications, such as methotrexate. It works by increasing folate levels. This helps protect healthy cells in your body. It may also be used to treat anemia caused by low levels of folate. It can also be used with  fluorouracil, a type of chemotherapy, to treat colorectal cancer. It works by increasing the effects of fluorouracil in the body. This medicine may be used for other purposes; ask your health care provider or pharmacist if you have questions. What should I tell my care team before I take this medication? They need to know if you have any of these conditions: Anemia from low levels of vitamin B12 in the blood An unusual or allergic reaction to leucovorin, folic acid, other medications, foods, dyes, or preservatives Pregnant or trying to get pregnant Breastfeeding How should I use this medication? This medication is injected into a vein or a muscle. It is given by your care team in a hospital or clinic setting. Talk to your care team about the use of this medication in children. Special care may be needed. Overdosage: If you think you have taken too much of this medicine contact a poison control center or emergency room at once. NOTE: This medicine is only for you. Do not share this medicine with others. What if I miss a dose? Keep appointments for follow-up doses. It is important not to miss your dose. Call your care team if you are unable to keep an appointment. What may interact with this medication? Capecitabine Fluorouracil Phenobarbital Phenytoin Primidone Trimethoprim;sulfamethoxazole This list may not describe all possible interactions. Give your health care provider a list of all the medicines, herbs, non-prescription drugs, or dietary supplements you use. Also tell them if you smoke, drink alcohol, or use illegal drugs. Some items may interact with your medicine. What should I watch for while using this medication? Your condition will be monitored carefully while you are receiving this medication. This medication may increase the side effects of 5-fluorouracil. Tell your care team if you have diarrhea or mouth sores that do not get better or that get worse. What side effects may I  notice from receiving this medication? Side effects that you should report to your care team as soon as possible: Allergic reactions--skin rash, itching, hives, swelling of the face, lips, tongue, or throat This list may not describe all possible side effects. Call your doctor for medical advice about side effects. You may report side effects to FDA at 1-800-FDA-1088. Where should I keep my medication? This medication is given in a hospital or clinic. It will not be stored at home. NOTE: This sheet is a summary. It may not cover all possible information. If you have questions about this medicine, talk to your doctor, pharmacist, or health care provider.  2023 Elsevier/Gold Standard (2021-09-21 00:00:00)  Fluorouracil Injection What is this medication? FLUOROURACIL (flure oh YOOR a  sil) treats some types of cancer. It works by slowing down the growth of cancer cells. This medicine may be used for other purposes; ask your health care provider or pharmacist if you have questions. COMMON BRAND NAME(S): Adrucil What should I tell my care team before I take this medication? They need to know if you have any of these conditions: Blood disorders Dihydropyrimidine dehydrogenase (DPD) deficiency Infection, such as chickenpox, cold sores, herpes Kidney disease Liver disease Poor nutrition Recent or ongoing radiation therapy An unusual or allergic reaction to fluorouracil, other medications, foods, dyes, or preservatives If you or your partner are pregnant or trying to get pregnant Breast-feeding How should I use this medication? This medication is injected into a vein. It is administered by your care team in a hospital or clinic setting. Talk to your care team about the use of this medication in children. Special care may be needed. Overdosage: If you think you have taken too much of this medicine contact a poison control center or emergency room at once. NOTE: This medicine is only for you. Do  not share this medicine with others. What if I miss a dose? Keep appointments for follow-up doses. It is important not to miss your dose. Call your care team if you are unable to keep an appointment. What may interact with this medication? Do not take this medication with any of the following: Live virus vaccines This medication may also interact with the following: Medications that treat or prevent blood clots, such as warfarin, enoxaparin, dalteparin This list may not describe all possible interactions. Give your health care provider a list of all the medicines, herbs, non-prescription drugs, or dietary supplements you use. Also tell them if you smoke, drink alcohol, or use illegal drugs. Some items may interact with your medicine. What should I watch for while using this medication? Your condition will be monitored carefully while you are receiving this medication. This medication may make you feel generally unwell. This is not uncommon as chemotherapy can affect healthy cells as well as cancer cells. Report any side effects. Continue your course of treatment even though you feel ill unless your care team tells you to stop. In some cases, you may be given additional medications to help with side effects. Follow all directions for their use. This medication may increase your risk of getting an infection. Call your care team for advice if you get a fever, chills, sore throat, or other symptoms of a cold or flu. Do not treat yourself. Try to avoid being around people who are sick. This medication may increase your risk to bruise or bleed. Call your care team if you notice any unusual bleeding. Be careful brushing or flossing your teeth or using a toothpick because you may get an infection or bleed more easily. If you have any dental work done, tell your dentist you are receiving this medication. Avoid taking medications that contain aspirin, acetaminophen, ibuprofen, naproxen, or ketoprofen unless  instructed by your care team. These medications may hide a fever. Do not treat diarrhea with over the counter products. Contact your care team if you have diarrhea that lasts more than 2 days or if it is severe and watery. This medication can make you more sensitive to the sun. Keep out of the sun. If you cannot avoid being in the sun, wear protective clothing and sunscreen. Do not use sun lamps, tanning beds, or tanning booths. Talk to your care team if you or your partner wish to  become pregnant or think you might be pregnant. This medication can cause serious birth defects if taken during pregnancy and for 3 months after the last dose. A reliable form of contraception is recommended while taking this medication and for 3 months after the last dose. Talk to your care team about effective forms of contraception. Do not father a child while taking this medication and for 3 months after the last dose. Use a condom while having sex during this time period. Do not breastfeed while taking this medication. This medication may cause infertility. Talk to your care team if you are concerned about your fertility. What side effects may I notice from receiving this medication? Side effects that you should report to your care team as soon as possible: Allergic reactions--skin rash, itching, hives, swelling of the face, lips, tongue, or throat Heart attack--pain or tightness in the chest, shoulders, arms, or jaw, nausea, shortness of breath, cold or clammy skin, feeling faint or lightheaded Heart failure--shortness of breath, swelling of the ankles, feet, or hands, sudden weight gain, unusual weakness or fatigue Heart rhythm changes--fast or irregular heartbeat, dizziness, feeling faint or lightheaded, chest pain, trouble breathing High ammonia level--unusual weakness or fatigue, confusion, loss of appetite, nausea, vomiting, seizures Infection--fever, chills, cough, sore throat, wounds that don't heal, pain or  trouble when passing urine, general feeling of discomfort or being unwell Low red blood cell level--unusual weakness or fatigue, dizziness, headache, trouble breathing Pain, tingling, or numbness in the hands or feet, muscle weakness, change in vision, confusion or trouble speaking, loss of balance or coordination, trouble walking, seizures Redness, swelling, and blistering of the skin over hands and feet Severe or prolonged diarrhea Unusual bruising or bleeding Side effects that usually do not require medical attention (report to your care team if they continue or are bothersome): Dry skin Headache Increased tears Nausea Pain, redness, or swelling with sores inside the mouth or throat Sensitivity to light Vomiting This list may not describe all possible side effects. Call your doctor for medical advice about side effects. You may report side effects to FDA at 1-800-FDA-1088. Where should I keep my medication? This medication is given in a hospital or clinic. It will not be stored at home. NOTE: This sheet is a summary. It may not cover all possible information. If you have questions about this medicine, talk to your doctor, pharmacist, or health care provider.  2023 Elsevier/Gold Standard (2021-08-17 00:00:00)

## 2022-04-07 ENCOUNTER — Ambulatory Visit: Payer: Medicare Other

## 2022-04-07 ENCOUNTER — Telehealth: Payer: Self-pay

## 2022-04-07 NOTE — Telephone Encounter (Signed)
Kim Castro states that she is doing fine. She just does not feel her best. She has no nausea. She is eating, small amounts of food.  She is drinking and urinating well.  She knows to call the office at [phone] if she has any questions or concerns.

## 2022-04-07 NOTE — Telephone Encounter (Signed)
-----   Message from Rolene Course, RN sent at 04/06/2022  3:58 PM EST ----- Regarding: Burr Medico 1st Tx F/U - Avastin, FOLFIRI Feng 1st Tx F/U call - Avastin, FOLFIRI.  Pt has had FOLFOX before, new drugs today were Avastin & Irinotecan only.  Tolerated infusion well.

## 2022-04-08 ENCOUNTER — Inpatient Hospital Stay: Payer: Medicare Other

## 2022-04-08 VITALS — BP 156/62 | HR 60 | Temp 98.0°F | Resp 18

## 2022-04-08 DIAGNOSIS — Z5111 Encounter for antineoplastic chemotherapy: Secondary | ICD-10-CM | POA: Diagnosis not present

## 2022-04-08 DIAGNOSIS — C182 Malignant neoplasm of ascending colon: Secondary | ICD-10-CM

## 2022-04-08 MED ORDER — SODIUM CHLORIDE 0.9% FLUSH
10.0000 mL | INTRAVENOUS | Status: DC | PRN
  Administered 2022-04-08: 10 mL

## 2022-04-08 MED ORDER — PEGFILGRASTIM-CBQV 6 MG/0.6ML ~~LOC~~ SOSY
6.0000 mg | PREFILLED_SYRINGE | Freq: Once | SUBCUTANEOUS | Status: AC
  Administered 2022-04-08: 6 mg via SUBCUTANEOUS
  Filled 2022-04-08: qty 0.6

## 2022-04-08 MED ORDER — HEPARIN SOD (PORK) LOCK FLUSH 100 UNIT/ML IV SOLN
500.0000 [IU] | Freq: Once | INTRAVENOUS | Status: AC | PRN
  Administered 2022-04-08: 500 [IU]

## 2022-04-14 ENCOUNTER — Telehealth: Payer: Self-pay | Admitting: Hematology

## 2022-04-14 NOTE — Telephone Encounter (Signed)
Called patient to notify of change in future appointment. Patient notified and relayed varies appointment information to patient.

## 2022-04-15 MED FILL — Dexamethasone Sodium Phosphate Inj 100 MG/10ML: INTRAMUSCULAR | Qty: 1 | Status: AC

## 2022-04-17 NOTE — Assessment & Plan Note (Signed)
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 Path showed overall Stage IIIB cancer.  -s/p 6 months adjuvant FOLFOX, completed 06/01/20, oxaliplatin held for final 2 cycles due to neuropathy.  -surveillance colonoscopy on 02/10/21 under Dr. Carlean Purl showed only diverticulosis. Repeat due in 01/2024. -restaging CT CAP 08/26/21 was NED. -s/p BSO and hysterectomy on 12/22/21 with Dr. Berline Lopes. Path revealed metastatic moderately differentiated colonic adenocarcinoma involving both ovaries. Peritoneal deposits were also positive for metastatic mucinous adenocarcinoma, consistent with colorectal primary. -she restarted FOLFOX on 01/24/22. -Due to disease progression, chemotherapy changed to second line FOLFIRI and bevacizumab on April 06, 2022.

## 2022-04-17 NOTE — Assessment & Plan Note (Signed)
-  Secondary to oxaliplatin -She is on Lyrica and B complex, continue to monitor.  Overall stable.

## 2022-04-17 NOTE — Assessment & Plan Note (Signed)
-  Dx in 05/2014, s/p left lumpectomy with Dr Excell Seltzer, adjuvant RT with Dr Pablo Ledger, and Anastrozole 08/2014 - 01/13/20 under Dr Lindi Adie.  -DEXA 09/11/20 T score +1.2 normal.  -most recent mammogram on 10/04/21 was negative. Will continue yearly

## 2022-04-18 ENCOUNTER — Inpatient Hospital Stay: Payer: Medicare Other

## 2022-04-18 ENCOUNTER — Inpatient Hospital Stay: Payer: Medicare Other | Admitting: Hematology

## 2022-04-18 ENCOUNTER — Other Ambulatory Visit: Payer: Self-pay

## 2022-04-18 ENCOUNTER — Encounter: Payer: Self-pay | Admitting: Hematology

## 2022-04-18 VITALS — BP 152/57

## 2022-04-18 VITALS — BP 167/84 | HR 75 | Temp 98.2°F | Resp 19 | Ht 65.0 in | Wt 243.1 lb

## 2022-04-18 DIAGNOSIS — C50919 Malignant neoplasm of unspecified site of unspecified female breast: Secondary | ICD-10-CM

## 2022-04-18 DIAGNOSIS — C182 Malignant neoplasm of ascending colon: Secondary | ICD-10-CM

## 2022-04-18 DIAGNOSIS — Z170 Estrogen receptor positive status [ER+]: Secondary | ICD-10-CM

## 2022-04-18 DIAGNOSIS — G620 Drug-induced polyneuropathy: Secondary | ICD-10-CM | POA: Diagnosis not present

## 2022-04-18 DIAGNOSIS — R200 Anesthesia of skin: Secondary | ICD-10-CM

## 2022-04-18 DIAGNOSIS — Z5111 Encounter for antineoplastic chemotherapy: Secondary | ICD-10-CM | POA: Diagnosis not present

## 2022-04-18 DIAGNOSIS — Z95828 Presence of other vascular implants and grafts: Secondary | ICD-10-CM

## 2022-04-18 DIAGNOSIS — R202 Paresthesia of skin: Secondary | ICD-10-CM

## 2022-04-18 LAB — CMP (CANCER CENTER ONLY)
ALT: 27 U/L (ref 0–44)
AST: 45 U/L — ABNORMAL HIGH (ref 15–41)
Albumin: 3.6 g/dL (ref 3.5–5.0)
Alkaline Phosphatase: 129 U/L — ABNORMAL HIGH (ref 38–126)
Anion gap: 7 (ref 5–15)
BUN: 12 mg/dL (ref 8–23)
CO2: 27 mmol/L (ref 22–32)
Calcium: 9.7 mg/dL (ref 8.9–10.3)
Chloride: 110 mmol/L (ref 98–111)
Creatinine: 0.78 mg/dL (ref 0.44–1.00)
GFR, Estimated: >60 mL/min (ref >60)
Glucose, Bld: 167 mg/dL — ABNORMAL HIGH (ref 70–99)
Potassium: 3.2 mmol/L — ABNORMAL LOW (ref 3.5–5.1)
Sodium: 144 mmol/L (ref 135–145)
Total Bilirubin: 1 mg/dL (ref 0.3–1.2)
Total Protein: 5.8 g/dL — ABNORMAL LOW (ref 6.5–8.1)

## 2022-04-18 LAB — CBC WITH DIFFERENTIAL (CANCER CENTER ONLY)
Abs Immature Granulocytes: 0.87 10*3/uL — ABNORMAL HIGH (ref 0.00–0.07)
Basophils Absolute: 0.1 10*3/uL (ref 0.0–0.1)
Basophils Relative: 1 %
Eosinophils Absolute: 0.1 10*3/uL (ref 0.0–0.5)
Eosinophils Relative: 1 %
HCT: 28.1 % — ABNORMAL LOW (ref 36.0–46.0)
Hemoglobin: 9.4 g/dL — ABNORMAL LOW (ref 12.0–15.0)
Immature Granulocytes: 9 %
Lymphocytes Relative: 33 %
Lymphs Abs: 3.2 10*3/uL (ref 0.7–4.0)
MCH: 35.3 pg — ABNORMAL HIGH (ref 26.0–34.0)
MCHC: 33.5 g/dL (ref 30.0–36.0)
MCV: 105.6 fL — ABNORMAL HIGH (ref 80.0–100.0)
Monocytes Absolute: 0.6 10*3/uL (ref 0.1–1.0)
Monocytes Relative: 6 %
Neutro Abs: 5.1 10*3/uL (ref 1.7–7.7)
Neutrophils Relative %: 50 %
Platelet Count: 100 10*3/uL — ABNORMAL LOW (ref 150–400)
RBC: 2.66 MIL/uL — ABNORMAL LOW (ref 3.87–5.11)
RDW: 18.7 % — ABNORMAL HIGH (ref 11.5–15.5)
WBC Count: 9.9 10*3/uL (ref 4.0–10.5)
nRBC: 1.4 % — ABNORMAL HIGH (ref 0.0–0.2)

## 2022-04-18 MED ORDER — PALONOSETRON HCL INJECTION 0.25 MG/5ML
0.2500 mg | Freq: Once | INTRAVENOUS | Status: AC
  Administered 2022-04-18: 0.25 mg via INTRAVENOUS
  Filled 2022-04-18: qty 5

## 2022-04-18 MED ORDER — SODIUM CHLORIDE 0.9 % IV SOLN
150.0000 mg/m2 | Freq: Once | INTRAVENOUS | Status: AC
  Administered 2022-04-18: 340 mg via INTRAVENOUS
  Filled 2022-04-18: qty 15

## 2022-04-18 MED ORDER — SODIUM CHLORIDE 0.9 % IV SOLN
5.0000 mg/kg | Freq: Once | INTRAVENOUS | Status: AC
  Administered 2022-04-18: 500 mg via INTRAVENOUS
  Filled 2022-04-18: qty 16

## 2022-04-18 MED ORDER — SODIUM CHLORIDE 0.9 % IV SOLN
10.0000 mg | Freq: Once | INTRAVENOUS | Status: AC
  Administered 2022-04-18: 10 mg via INTRAVENOUS
  Filled 2022-04-18: qty 10

## 2022-04-18 MED ORDER — SODIUM CHLORIDE 0.9% FLUSH: 10.0000 mL | INTRAVENOUS | Status: DC | PRN

## 2022-04-18 MED ORDER — POTASSIUM CHLORIDE CRYS ER 10 MEQ PO TBCR: 10.0000 meq | EXTENDED_RELEASE_TABLET | Freq: Every day | ORAL | 1 refills | Status: DC

## 2022-04-18 MED ORDER — HEPARIN SOD (PORK) LOCK FLUSH 100 UNIT/ML IV SOLN: 500.0000 [IU] | Freq: Once | INTRAVENOUS | Status: DC | PRN

## 2022-04-18 MED ORDER — SODIUM CHLORIDE 0.9 % IV SOLN
2232.0000 mg/m2 | INTRAVENOUS | Status: DC
  Administered 2022-04-18: 5000 mg via INTRAVENOUS
  Filled 2022-04-18: qty 100

## 2022-04-18 MED ORDER — PREGABALIN 25 MG PO CAPS: 25.0000 mg | ORAL_CAPSULE | Freq: Two times a day (BID) | ORAL | 0 refills | Status: DC

## 2022-04-18 MED ORDER — ATROPINE SULFATE 1 MG/ML IV SOLN
0.5000 mg | Freq: Once | INTRAVENOUS | Status: AC | PRN
  Administered 2022-04-18: 0.5 mg via INTRAVENOUS
  Filled 2022-04-18: qty 1

## 2022-04-18 MED ORDER — SODIUM CHLORIDE 0.9 % IV SOLN
400.0000 mg/m2 | Freq: Once | INTRAVENOUS | Status: AC
  Administered 2022-04-18: 896 mg via INTRAVENOUS
  Filled 2022-04-18: qty 44.8

## 2022-04-18 MED ORDER — SODIUM CHLORIDE 0.9% FLUSH
10.0000 mL | INTRAVENOUS | Status: DC | PRN
  Administered 2022-04-18: 10 mL

## 2022-04-18 MED ORDER — SODIUM CHLORIDE 0.9 % IV SOLN: Freq: Once | INTRAVENOUS | Status: AC

## 2022-04-18 MED ORDER — FLUOROURACIL CHEMO INJECTION 2.5 GM/50ML
400.0000 mg/m2 | Freq: Once | INTRAVENOUS | Status: AC
  Administered 2022-04-18: 900 mg via INTRAVENOUS
  Filled 2022-04-18: qty 18

## 2022-04-18 NOTE — Progress Notes (Signed)
[facility]   [phone] [phone]   Clinic Follow up Note   Patient Care Team: [name], MD as PCP - General (Family Medicine) Rolm Bookbinder, MD as Consulting Physician (General Surgery) Truitt Merle, MD as Consulting Physician (Hematology) Leighton Ruff, MD as Consulting Physician (General Surgery) Nicholas Lose, MD as Consulting Physician (Hematology and Oncology)  Date of Service:  04/18/2022  CHIEF COMPLAINT: f/u of right colon cancer and h/o DCIS   CURRENT THERAPY:   1.FOLFOX q14 days, restarted 01/24/22 2.PENDING second line FOLFIRI/beva starting ~04/04/22  ASSESSMENT:  Patsy Zaragoza is a 76 y.o. female with   Right colon cancer pT3N2aM0 stage IIB, MSS, metastatic to b/l ovaries and peritoneum, KRAS mutation G12V (+)  -Initially diagnosed in 08/2019, s/p resection with Dr Marcello Moores on 11/01/19. Path showed overall Stage IIIB cancer.  -s/p 6 months adjuvant FOLFOX, completed 06/01/20, oxaliplatin held for final 2 cycles due to neuropathy.  -surveillance colonoscopy on 02/10/21 under Dr. Carlean Purl showed only diverticulosis. Repeat due in 01/2024. -restaging CT CAP 08/26/21 was NED. -s/p BSO and hysterectomy on 12/22/21 with Dr. Berline Lopes. Path revealed metastatic moderately differentiated colonic adenocarcinoma involving both ovaries. Peritoneal deposits were also positive for metastatic mucinous adenocarcinoma, consistent with colorectal primary. -she restarted FOLFOX on 01/24/22. -Due to disease progression, chemotherapy changed to second line FOLFIRI and bevacizumab on April 06, 2022.  Malignant neoplasm of female breast [facility]) -Dx in 05/2014, s/p left lumpectomy with Dr Excell Seltzer, adjuvant RT with Dr Pablo Ledger, and Anastrozole 08/2014 - 01/13/20 under Dr Lindi Adie.  -DEXA 09/11/20 T score +1.2 normal.  -most recent mammogram on 10/04/21 was negative. Will continue yearly   Peripheral neuropathy due to chemotherapy ([place]) -Secondary to  oxaliplatin -She is on Lyrica and B complex, continue to monitor.  Overall stable.    PLAN: -lab reviewed platelet count and K slightly low  -Proceed with  beva and FOLFRIR today at same reduced dose  -rescheduled her CT and MRI to later this week due to chemo today  -lab,flush,f/u and chemo 05/03/22  SUMMARY OF ONCOLOGIC HISTORY: Oncology History Overview Note   Cancer Staging  Malignant neoplasm of female breast [facility]) Staging form: Breast, AJCC 7th Edition - Clinical stage from 06/11/2014: Stage 0 (Tis (DCIS), N0, M0) - Unsigned Staged by: Pathologist and managing physician Laterality: Left Estrogen receptor status: Positive Progesterone receptor status: Positive Stage used in treatment planning: Yes National guidelines used in treatment planning: Yes Type of national guideline used in treatment planning: NCCN - Pathologic stage from 07/03/2014: Stage Unknown (Tis (DCIS), NX, cM0) - Signed by Enid Cutter, MD on 07/10/2014 Staged by: Pathologist Laterality: Left Estrogen receptor status: Positive Progesterone receptor status: Positive Stage used in treatment planning: Yes National guidelines used in treatment planning: Yes Type of national guideline used in treatment planning: NCCN Staging comments: Staged on final lumpectomy specimen by Dr. Donato Heinz.  Right colon cancer Staging form: Colon and Rectum, AJCC 8th Edition - Pathologic stage from 11/01/2019: Stage IIIB (pT3, pN2a, cM0) - Signed by Truitt Merle, MD on 11/29/2019 Stage prefix: Initial diagnosis Histologic grading system: 4 grade system Histologic grade (G): G2 Residual tumor (R): R0 - None Tumor deposits (TD): Absent Perineural invasion (PNI): Absent Microsatellite instability (MSI): Stable KRAS mutation: Unknown NRAS mutation: Unknown BRAF mutation: Unknown     Malignant neoplasm of female breast ([place])  05/29/2014 Initial Biopsy   Left breast needle core biopsy: Grade 2, DCIS with calcs. ER+ (100%), PR+ (96%).     06/04/2014 Initial  Diagnosis   Left breast DCIS with calcifications, ER 100%, PR 96%   06/10/2014 Breast MRI   Left breast: 2.4 x 1.3 x 1.1 cm area of patchy non-mass enhancement upper outer quadrant includes postbiopsy seroma; Right breast: 1.2 cm previously biopsied stable benign fibroadenoma   06/12/2014 Procedure   Genetic counseling/testing: Identified 1 VUS on CHEK2 gene. Remainder of 17 gene panel tested negative and included: ATM, BARD1, BRCA 1/2, BRIP1, CDH1, CHEK2, EPCAM, MLH1, MSH2, MSH6, NBN, NF1, PALB2, PTEN, RAD50, RAD51C, RAD51D, STK11, and TP53.    07/01/2014 Surgery   Left breast lumpectomy ([place]): Grade 1, DCIS, spanning 2.3 cm, 1 mm margin, ER 100%, PR 96%   07/31/2014 - 08/28/2014 Radiation Therapy   Adjuvant RT completed Pablo Ledger). Left breast: Total dose 42.5 Gy over 17 fractions. Left breast boost: Total dose 7.5 Gy over 3 fractions.    09/14/2014 - 01/13/2020 Anti-estrogen oral therapy   Anastrazole 50m daily. Planned duration of treatment: 5 years ([place]. Completed in 01/2020.    09/25/2014 Survivorship   Survivorship Care Plan given to patient and reviewed with her in person.    03/02/2021 Imaging   CT CAP  IMPRESSION: 1. No findings of active/recurrent malignancy. Partial right hemicolectomy. 2. Endometrial stripe remains mildly thickened, but endometrial biopsy in May was negative for malignancy. 3. Progressive endplate sclerosis and endplate irregularity at T2-3, probably due to degenerative endplate findings. If the has referable upper thoracic pain/symptoms then thoracic spine MRI could be used for further workup. 4. Other imaging findings of potential clinical significance: Mild cardiomegaly. Aortic Atherosclerosis (ICD10-I70.0). Mild mitral valve calcification. Postoperative findings in the left breast with adjacent radiation port anteriorly in the left upper lobe. Tiny pulmonary nodules in the left lower lobe are unchanged from earliest available  comparison of 10/17/2019 and probably benign although may merit surveillance. Multilevel lumbar impingement. Mild pelvic floor laxity.   Right colon cancer  09/19/2019 Imaging   CT AP W contrast 09/19/19  IMPRESSION Fullness in the cecum, cannot exclude a mass. No evidence for metastatic disease is identified.    09/24/2019 Procedure   Colonoscopy by Dr MEber Jones5/25/21 IMPRESSION 1. The colon was redundant  2. Mild diverticulosis was noted through the entire examined colon 3. Single 144mpolyp was found in the ascending colon; polypectomy was performed using snare cautery and biopsy forceps 4. Mild diverticulosis was notes in the descending colon and sigmoid colon.  5. Single polyp was found in the sigmoid colon, polypectomy was performed with cold forceps.  6. Single polyp was found in the rectosigmoid colon; polypectomy was performed with cold snare  7. Small internal hemorrhoids  8. Large mass was found at the cecum; multiple biopsies of the area were performed using cold forceps; injection (tattooing) was performed distal to the mass.    09/24/2019 Initial Biopsy   INTERPRETATION AND DIAGNOSIS:  A. Cecum, biopsy:  Invasive moderately differentiated adenocarcinoma.  see comment  B. Polyp @ ascending colon, polypectomy:  Tubular Adenoma  C. Polyp @ sigmoid colon Polypectomy:  hyperplastic polyp.  D. Polyp @ rectosigmoid colon, Polypectomy:  Hyperplastic Polyp      10/16/2019 Imaging   CT Chest IMPRESSION: 1. Multiple small pulmonary nodules measuring 5 mm or less in size in the lungs. These are nonspecific and are typically considered statistically likely benign. However, given the patient's history of primary malignancy, close attention on follow-up studies is recommended to ensure stability. 2. Aortic atherosclerosis, in addition to right coronary artery disease. Assessment for potential risk factor  modification, dietary therapy or pharmacologic therapy may be warranted, if  clinically indicated. 3. There are calcifications of the aortic valve and mitral annulus. Echocardiographic correlation for evaluation of potential valvular dysfunction may be warranted if clinically indicated. 4. Small hiatal hernia.   Aortic Atherosclerosis (ICD10-I70.0).   11/01/2019 Initial Diagnosis   Colon cancer ([place])   11/01/2019 Surgery   LAPAROSCOPIC PARTIAL COLECTOMY by Dr Marcello Moores and Dr Johney Maine   11/01/2019 Pathology Results   FINAL MICROSCOPIC DIAGNOSIS:   A. COLON, PROXIMAL RIGHT, COLECTOMY:  - Invasive colonic adenocarcinoma, 5 cm.  - Tumor invades through the muscularis propria into pericolonic tissues.   - Margins of resection are not involved.  - Metastatic carcinoma in (5) of (13) lymph nodes.  - See oncology table.    MSI Stable  Mismatch repair normal  MLH1 - Preserved nuclear expression (greater 50% tumor expression) MSH2 - Preserved nuclear expression (greater 50% tumor expression) MSH6 - Preserved nuclear expression (greater 50% tumor expression) PMS2 - Preserved nuclear expression (greater 50% tumor expression)   11/01/2019 Cancer Staging   Staging form: Colon and Rectum, AJCC 8th Edition - Pathologic stage from 11/01/2019: Stage IIIB (pT3, pN2a, cM0) - Signed by Truitt Merle, MD on 11/29/2019   12/10/2019 Procedure   PAC placed 12/10/19   12/17/2019 - 06/01/2020 Chemotherapy   FOLFOX q2weeks starting in 2 weeks starting 12/17/19. Held 01/27/20-02/10/20 due to b/l PE. Oxaliplatin held C11-12 due to neuropathy. Completed on 06/01/20   03/02/2021 Imaging   CT CAP  IMPRESSION: 1. No findings of active/recurrent malignancy. Partial right hemicolectomy. 2. Endometrial stripe remains mildly thickened, but endometrial biopsy in May was negative for malignancy. 3. Progressive endplate sclerosis and endplate irregularity at T2-3, probably due to degenerative endplate findings. If the has referable upper thoracic pain/symptoms then thoracic spine MRI could be used for  further workup. 4. Other imaging findings of potential clinical significance: Mild cardiomegaly. Aortic Atherosclerosis (ICD10-I70.0). Mild mitral valve calcification. Postoperative findings in the left breast with adjacent radiation port anteriorly in the left upper lobe. Tiny pulmonary nodules in the left lower lobe are unchanged from earliest available comparison of 10/17/2019 and probably benign although may merit surveillance. Multilevel lumbar impingement. Mild pelvic floor laxity.   08/26/2021 Imaging   EXAM: CT CHEST, ABDOMEN, AND PELVIS WITH CONTRAST  IMPRESSION: 1. Stable examination without new or progressive findings to suggest local recurrence or metastatic disease within the chest, abdomen, or pelvis. 2. Hepatomegaly with hepatic steatosis. 3. Sigmoid colonic diverticulosis without findings of acute diverticulitis. 4. Similar prominent endplate sclerosis and irregularity at T2-T3 is most consistent with Modic type endplate degenerative changes. However, if patient has referable upper thoracic pain consider further workup with thoracic spine MRI. 5. Similar thickening of the endometrial stripe measuring up to 8 mm, which was previously biopsied with results negative for malignancy. 6. Aortic Atherosclerosis (ICD10-I70.0).   11/10/2021 PET scan   IMPRESSION: 1. LEFT ovary is increased in size and is intensely hypermetabolic. While physiologic hypermetabolic ovarian tissue is not uncommon, the enlargement and asymmetric activity warrants further evaluation. Consider contrast pelvic MRI vs tissue sampling. 2. No evidence of metastatic colorectal carcinoma otherwise. 3. Post RIGHT hemicolectomy anatomy. 4. Evidence of radiation change in the LEFT upper lobe (remote breast cancer).     12/22/2021 Relapse/Recurrence    FINAL MICROSCOPIC DIAGNOSIS:   A. LEFT OVARY AND FALLOPIAN TUBE, SALPINGO OOPHORECTOMY:  - Metastatic moderately differentiated colonic adenocarcinoma  involving left ovary  - Focal ovarian stromal calcification  - Segment of  benign fallopian tube   B. UTERUS WITH RIGHT FALLOPIAN TUBE AND OVARY, HYSTERECTOMY AND SALPINGO-OOPHORECTOMY:  - Metastatic moderately differentiated colonic adenocarcinoma involving right ovary  - Focal invasive extensive adenomyosis  - Benign endometrial polyps  - Benign proliferative phase endometrium  - Hydrosalpinx of right fallopian tube  - Focal ovarian stromal calcification   C. PERITONEAL DEPOSITS, ANTERIOR CUL DE SAC, BIOPSY:  - Metastatic mucinous adenocarcinoma, consistent with colorectal primary    COMMENT:  Immunohistochemical stains show that the tumor cells are positive for CK20 and CDX2 while they are negative for CK7 and PAX8, consistent with above interpretation.    01/24/2022 - 03/23/2022 Chemotherapy   Patient is on Treatment Plan : COLORECTAL FOLFOX q14d x 3 months     04/01/2022 Imaging   CT AP IMPRESSION: 1. New omental metastatic disease. 2. Vague hypoattenuating lesion in the inferior right hepatic lobe is new and also worrisome for metastatic disease. 3. Similar small right lower lobe nodules. Recommend continued attention on follow-up. 4. Steatotic enlarged liver. 5.  Aortic atherosclerosis (ICD10-I70.0).   04/06/2022 -  Chemotherapy   Patient is on Treatment Plan : COLORECTAL FOLFIRI + Bevacizumab q14d        INTERVAL HISTORY:  Kim Castro is here for a follow up of right colon cancer and h/o DCIS  She was last seen by NP Lacie on 04/04/22 She presents to the clinic  alone. Pt reports of some fatigue  far as doing a normal routine just slower, (like house chores, yard work) and some nausea .Pt denies having stomach pain. Pt states she still have neuropathy     All other systems were reviewed with the patient and are negative.  MEDICAL HISTORY:  Past Medical History:  Diagnosis Date   Aortic atherosclerosis (HCC)    Arthritis    feet, lower back   Basal cell  carcinoma    arm   Breast cancer of upper-outer quadrant of left female breast ([place]) 06/04/2014   Cataract    immature on the left   Colon cancer ([place]) 08/2019   Diabetes mellitus without complication (HCC)    Diverticulosis    Dizziness    > 38yr ago;took Antivert    Family history of anesthesia complication    sister slow to wake up with anesthesia   Family history of breast cancer    Family history of colon cancer    Family history of uterine cancer    GERD (gastroesophageal reflux disease)    takes occasional TUMs   History of bronchitis    > 89[place]   History of colon polyps    History of hiatal hernia    Small noted on CT   History of pulmonary embolus ([facility])    Hypertension    takes Losartan daily and HCTZ   Iron deficiency anemia    Joint pain    Numbness    to toes on each foot   Peripheral neuropathy    feet and toes   Personal history of radiation therapy    Pulmonary nodules    Noted on CT   Radiation 07/31/14-08/28/14   Left Breast 20 fxs   Seasonal allergies    takes Claritin prn   Urinary frequency    Vitamin D deficiency    takes VIt D daily    SURGICAL HISTORY: Past Surgical History:  Procedure Laterality Date   BREAST BIOPSY Bilateral    BREAST LUMPECTOMY Left    BREAST LUMPECTOMY WITH RADIOACTIVE SEED LOCALIZATION  Left 07/01/2014   Procedure: LEFT BREAST LUMPECTOMY WITH RADIOACTIVE SEED LOCALIZATION;  Surgeon: Excell Seltzer, MD;  Location: [place];  Service: General;  Laterality: Left;   CATARACT EXTRACTION Right    COLONOSCOPY  09/24/2019   [place]   LAPAROSCOPIC PARTIAL COLECTOMY N/A 11/01/2019   Procedure: LAPAROSCOPIC PARTIAL COLECTOMY;  Surgeon: Leighton Ruff, MD;  Location: [facility];  Service: General;  Laterality: N/A;   POLYPECTOMY     PORTACATH PLACEMENT N/A 12/10/2019   Procedure: INSERTION PORT-A-CATH ULTRASOUND GUIDED IN RIGHT IJ;  Surgeon: Leighton Ruff, MD;  Location: [facility];  Service: General;  Laterality:  N/A;   ROBOTIC ASSISTED TOTAL HYSTERECTOMY Bilateral 12/22/2021   Procedure: XI ROBOTIC ASSISTED TOTAL HYSTERECTOMY WITH BILATERAL SALPINGO OOPHORECTOMY;  Surgeon: [name], MD;  Location: [facility];  Service: Gynecology;  Laterality: Bilateral;   TOTAL KNEE ARTHROPLASTY Left 10/24/2012   Procedure: TOTAL KNEE ARTHROPLASTY;  Surgeon: Kerin Salen, MD;  Location: [facility];  Service: Orthopedics;  Laterality: Left;   TOTAL KNEE ARTHROPLASTY Right 01/09/2013   Procedure: TOTAL KNEE ARTHROPLASTY;  Surgeon: Kerin Salen, MD;  Location: [place];  Service: Orthopedics;  Laterality: Right;   TUBAL LIGATION      I have reviewed the social history and family history with the patient and they are unchanged from previous note.  ALLERGIES:  is allergic to oxycodone.  MEDICATIONS:  Current Outpatient Medications  Medication Sig Dispense Refill   potassium chloride (KLOR-CON M) 10 MEQ tablet Take 1 tablet (10 mEq total) by mouth daily. 30 tablet 1   acetaminophen (TYLENOL) 325 MG tablet Take 650 mg by mouth every 6 (six) hours as needed for moderate pain.     b complex vitamins capsule Take 1 capsule by mouth daily.     Cholecalciferol (VITAMIN D) 2000 UNITS CAPS Take 2,000 Units by mouth daily.      Ibuprofen 200 MG CAPS Take by mouth.     loratadine (CLARITIN) 10 MG tablet Take 10 mg by mouth daily as needed for allergies.     losartan (COZAAR) 25 MG tablet Take 1 tablet (25 mg total) by mouth daily. 90 tablet 3   metFORMIN (GLUCOPHAGE) 500 MG tablet Take 1 tablet (500 mg total) by mouth in the morning and at bedtime. 180 tablet 3   Multiple Vitamins-Minerals (MULTIVITAMIN WITH MINERALS) tablet Take 1 tablet by mouth daily.     omeprazole (PRILOSEC) 20 MG capsule Take 20 mg by mouth daily before breakfast.      pravastatin (PRAVACHOL) 10 MG tablet Take 1 tablet (10 mg total) by mouth daily. 90 tablet 3   pregabalin (LYRICA) 25 MG capsule Take 1 capsule (25 mg total) by mouth 2 (two) times daily.  60 capsule 0   No current facility-administered medications for this visit.   Facility-Administered Medications Ordered in Other Visits  Medication Dose Route Frequency Provider Last Rate Last Admin   fluorouracil (ADRUCIL) 5,000 mg in sodium chloride 0.9 % 150 mL chemo infusion  2,232 mg/m2 (Treatment Plan Recorded) Intravenous 1 day or 1 dose Truitt Merle, MD   Infusion Verify at 04/18/22 1528   heparin lock flush 100 unit/mL  500 Units Intracatheter Once PRN Truitt Merle, MD       sodium chloride flush (NS) 0.9 % injection 10 mL  10 mL Intracatheter PRN Truitt Merle, MD        PHYSICAL EXAMINATION: ECOG PERFORMANCE STATUS: 1 - Symptomatic but completely ambulatory  Vitals:   04/18/22 1110  BP: (!) 167/84  Pulse: 75  Resp: 19  Temp: 98.2 F (36.8 C)  SpO2: 97%   Wt Readings from Last 3 Encounters:  04/18/22 243 lb 1.6 oz (110.3 kg)  04/04/22 239 lb 3.2 oz (108.5 kg)  03/21/22 241 lb 3.2 oz (109.4 kg)     GENERAL:alert, no distress and comfortable SKIN: skin color normal, no rashes or significant lesions EYES: normal, Conjunctiva are pink and non-injected, sclera clear  NEURO: alert & oriented x 3 with fluent speech  LABORATORY DATA:  I have reviewed the data as listed    Latest Ref Rng & Units 04/18/2022   10:18 AM 04/04/2022    9:18 AM 03/21/2022    8:12 AM  CBC  WBC 4.0 - 10.5 K/uL 9.9  3.6  3.8   Hemoglobin 12.0 - 15.0 g/dL 9.4  10.1  10.7   Hematocrit 36.0 - 46.0 % 28.1  30.7  32.1   Platelets 150 - 400 K/uL 100  138  148         Latest Ref Rng & Units 04/18/2022   10:18 AM 04/04/2022    9:18 AM 03/21/2022    8:12 AM  CMP  Glucose 70 - 99 mg/dL 167  149  149   BUN 8 - 23 mg/dL _0 Creatinine 0.44 - 1.00 mg/dL 0.78  0.69  0.63   Sodium 135 - 145 mmol/L 144  143  143   Potassium 3.5 - 5.1 mmol/L 3.2  3.6  4.0   Chloride 98 - 111 mmol/L 110  112  110   CO2 22 - 32 mmol/L _1 Calcium 8.9 - 10.3 mg/dL 9.7  9.4  9.2   Total Protein 6.5 - 8.1 g/dL  5.8  6.5  6.8   Total Bilirubin 0.3 - 1.2 mg/dL 1.0  1.1  1.1   Alkaline Phos 38 - 126 U/L 129  98  108   AST 15 - 41 U/L 45  44  64   ALT 0 - 44 U/L 27  32  45       RADIOGRAPHIC STUDIES: I have personally reviewed the radiological images as listed and agreed with the findings in the report. No results found.    Orders Placed This Encounter  Procedures   CBC with Differential ([place] Only)    Standing Status:   Future    Standing Expiration Date:   05/18/2023   CMP ([facility] only)    Standing Status:   Future    Standing Expiration Date:   05/18/2023   CBC with Differential ([facility] Only)    Standing Status:   Future    Standing Expiration Date:   06/01/2023   CMP ([place] only)    Standing Status:   Future    Standing Expiration Date:   06/01/2023   CEA (IN HOUSE-[facility])    Standing Status:   Standing    Number of Occurrences:   5    Standing Expiration Date:   04/19/2023   All questions were answered. The patient knows to call the clinic with any problems, questions or concerns. No barriers to learning was detected. The total time spent in the appointment was 30 minutes.     Truitt Merle, MD 04/18/2022   Felicity Coyer, CMA, am acting as scribe for Truitt Merle, MD.   I have reviewed the above documentation for accuracy and completeness, and I agree with  the above.

## 2022-04-18 NOTE — Progress Notes (Signed)
Per Dr. Burr Medico, will decrease irinotecan dose to '150mg'$ /m2  Laray Anger, PharmD PGY-2 Pharmacy Resident Hematology/Oncology [phone]  04/18/2022 12:42 PM

## 2022-04-18 NOTE — Patient Instructions (Signed)
[facility]  Discharge Instructions: Thank you for choosing [facility] to provide your oncology and hematology care.   If you have a lab appointment with the [facility], please go directly to the [facility] and check in at the registration area.   Wear comfortable clothing and clothing appropriate for easy access to any Portacath or PICC line.   We strive to give you quality time with your provider. You may need to reschedule your appointment if you arrive late (15 or more minutes).  Arriving late affects you and other patients whose appointments are after yours.  Also, if you miss three or more appointments without notifying the office, you may be dismissed from the clinic at the provider's discretion.      For prescription refill requests, have your pharmacy contact our office and allow 72 hours for refills to be completed.    Today you received the following chemotherapy and/or immunotherapy agents Mvasi, Irinotecan, Leucovorin, 5FU      To help prevent nausea and vomiting after your treatment, we encourage you to take your nausea medication as directed.  BELOW ARE SYMPTOMS THAT SHOULD BE REPORTED IMMEDIATELY: *FEVER GREATER THAN 100.4 F (38 C) OR HIGHER *CHILLS OR SWEATING *NAUSEA AND VOMITING THAT IS NOT CONTROLLED WITH YOUR NAUSEA MEDICATION *UNUSUAL SHORTNESS OF BREATH *UNUSUAL BRUISING OR BLEEDING *URINARY PROBLEMS (pain or burning when urinating, or frequent urination) *BOWEL PROBLEMS (unusual diarrhea, constipation, pain near the anus) TENDERNESS IN MOUTH AND THROAT WITH OR WITHOUT PRESENCE OF ULCERS (sore throat, sores in mouth, or a toothache) UNUSUAL RASH, SWELLING OR PAIN  UNUSUAL VAGINAL DISCHARGE OR ITCHING   Items with * indicate a potential emergency and should be followed up as soon as possible or go to the Emergency Department if any problems should occur.  Please show the CHEMOTHERAPY ALERT CARD or IMMUNOTHERAPY  ALERT CARD at check-in to the Emergency Department and triage nurse.  Should you have questions after your visit or need to cancel or reschedule your appointment, please contact [facility]  Dept: [phone]  and follow the prompts.  Office hours are 8:00 a.m. to 4:30 p.m. Monday - Friday. Please note that voicemails left after 4:00 p.m. may not be returned until the following business day.  We are closed weekends and major holidays. You have access to a nurse at all times for urgent questions. Please call the main number to the clinic Dept: [phone] and follow the prompts.   For any non-urgent questions, you may also contact your provider using MyChart. We now offer e-Visits for anyone 20 and older to request care online for non-urgent symptoms. For details visit [email or web address].   Also download the MyChart app! Go to the app store, search "MyChart", open the app, select [facility], and log in with your MyChart username and password.  Masks are optional in the cancer centers. If you would like for your care team to wear a mask while they are taking care of you, please let them know. You may have one support person who is at least 74 years old accompany you for your appointments.  Bevacizumab Injection What is this medication? BEVACIZUMAB (be va SIZ yoo mab) treats some types of cancer. It works by blocking a protein that causes cancer cells to grow and multiply. This helps to slow or stop the spread of cancer cells. It is a monoclonal antibody. This medicine may be used for other purposes; ask your health  care provider or pharmacist if you have questions. COMMON BRAND NAME(S): Alymsys, Avastin, MVASI, Noah Charon What should I tell my care team before I take this medication? They need to know if you have any of these conditions: Blood clots Coughing up blood Having or recent surgery Heart failure High blood pressure History of a connection between 2 or  more body parts that do not usually connect (fistula) History of a tear in your stomach or intestines Protein in your urine An unusual or allergic reaction to bevacizumab, other medications, foods, dyes, or preservatives Pregnant or trying to get pregnant Breast-feeding How should I use this medication? This medication is injected into a vein. It is given by your care team in a hospital or clinic setting. Talk to your care team the use of this medication in children. Special care may be needed. Overdosage: If you think you have taken too much of this medicine contact a poison control center or emergency room at once. NOTE: This medicine is only for you. Do not share this medicine with others. What if I miss a dose? Keep appointments for follow-up doses. It is important not to miss your dose. Call your care team if you are unable to keep an appointment. What may interact with this medication? Interactions are not expected. This list may not describe all possible interactions. Give your health care provider a list of all the medicines, herbs, non-prescription drugs, or dietary supplements you use. Also tell them if you smoke, drink alcohol, or use illegal drugs. Some items may interact with your medicine. What should I watch for while using this medication? Your condition will be monitored carefully while you are receiving this medication. You may need blood work while taking this medication. This medication may make you feel generally unwell. This is not uncommon as chemotherapy can affect healthy cells as well as cancer cells. Report any side effects. Continue your course of treatment even though you feel ill unless your care team tells you to stop. This medication may increase your risk to bruise or bleed. Call your care team if you notice any unusual bleeding. Before having surgery, talk to your care team to make sure it is ok. This medication can increase the risk of poor healing of your  surgical site or wound. You will need to stop this medication for 28 days before surgery. After surgery, wait at least 28 days before restarting this medication. Make sure the surgical site or wound is healed enough before restarting this medication. Talk to your care team if questions. Talk to your care team if you may be pregnant. Serious birth defects can occur if you take this medication during pregnancy and for 6 months after the last dose. Contraception is recommended while taking this medication and for 6 months after the last dose. Your care team can help you find the option that works for you. Do not breastfeed while taking this medication and for 6 months after the last dose. This medication can cause infertility. Talk to your care team if you are concerned about your fertility. What side effects may I notice from receiving this medication? Side effects that you should report to your care team as soon as possible: Allergic reactions--skin rash, itching, hives, swelling of the face, lips, tongue, or throat Bleeding--bloody or black, tar-like stools, vomiting blood or brown material that looks like coffee grounds, red or dark brown urine, small red or purple spots on skin, unusual bruising or bleeding Blood clot--pain, swelling, or warmth  in the leg, shortness of breath, chest pain Heart attack--pain or tightness in the chest, shoulders, arms, or jaw, nausea, shortness of breath, cold or clammy skin, feeling faint or lightheaded Heart failure--shortness of breath, swelling of the ankles, feet, or hands, sudden weight gain, unusual weakness or fatigue Increase in blood pressure Infection--fever, chills, cough, sore throat, wounds that don't heal, pain or trouble when passing urine, general feeling of discomfort or being unwell Infusion reactions--chest pain, shortness of breath or trouble breathing, feeling faint or lightheaded Kidney injury--decrease in the amount of urine, swelling of the  ankles, hands, or feet Stomach pain that is severe, does not go away, or gets worse Stroke--sudden numbness or weakness of the face, arm, or leg, trouble speaking, confusion, trouble walking, loss of balance or coordination, dizziness, severe headache, change in vision Sudden and severe headache, confusion, change in vision, seizures, which may be signs of posterior reversible encephalopathy syndrome (PRES) Side effects that usually do not require medical attention (report to your care team if they continue or are bothersome): Back pain Change in taste Diarrhea Dry skin Increased tears Nosebleed This list may not describe all possible side effects. Call your doctor for medical advice about side effects. You may report side effects to FDA at 1-800-FDA-1088. Where should I keep my medication? This medication is given in a hospital or clinic. It will not be stored at home. NOTE: This sheet is a summary. It may not cover all possible information. If you have questions about this medicine, talk to your doctor, pharmacist, or health care provider.  2023 Elsevier/Gold Standard (2021-08-20 00:00:00)  Irinotecan Injection What is this medication? IRINOTECAN (ir in oh TEE kan) treats some types of cancer. It works by slowing down the growth of cancer cells. This medicine may be used for other purposes; ask your health care provider or pharmacist if you have questions. COMMON BRAND NAME(S): Camptosar What should I tell my care team before I take this medication? They need to know if you have any of these conditions: Dehydration Diarrhea Infection, especially a viral infection, such as chickenpox, cold sores, herpes Liver disease Low blood cell levels (white cells, red cells, and platelets) Low levels of electrolytes, such as calcium, magnesium, or potassium in your blood Recent or ongoing radiation An unusual or allergic reaction to irinotecan, other medications, foods, dyes, or  preservatives If you or your partner are pregnant or trying to get pregnant Breast-feeding How should I use this medication? This medication is injected into a vein. It is given by your care team in a hospital or clinic setting. Talk to your care team about the use of this medication in children. Special care may be needed. Overdosage: If you think you have taken too much of this medicine contact a poison control center or emergency room at once. NOTE: This medicine is only for you. Do not share this medicine with others. What if I miss a dose? Keep appointments for follow-up doses. It is important not to miss your dose. Call your care team if you are unable to keep an appointment. What may interact with this medication? Do not take this medication with any of the following: Cobicistat Itraconazole This medication may also interact with the following: Certain antibiotics, such as clarithromycin, rifampin, rifabutin Certain antivirals for HIV or AIDS Certain medications for fungal infections, such as ketoconazole, posaconazole, voriconazole Certain medications for seizures, such as carbamazepine, phenobarbital, phenytoin Gemfibrozil Nefazodone St. John's wort This list may not describe all possible interactions.  Give your health care provider a list of all the medicines, herbs, non-prescription drugs, or dietary supplements you use. Also tell them if you smoke, drink alcohol, or use illegal drugs. Some items may interact with your medicine. What should I watch for while using this medication? Your condition will be monitored carefully while you are receiving this medication. You may need blood work while taking this medication. This medication may make you feel generally unwell. This is not uncommon as chemotherapy can affect healthy cells as well as cancer cells. Report any side effects. Continue your course of treatment even though you feel ill unless your care team tells you to stop. This  medication can cause serious side effects. To reduce the risk, your care team may give you other medications to take before receiving this one. Be sure to follow the directions from your care team. This medication may affect your coordination, reaction time, or judgement. Do not drive or operate machinery until you know how this medication affects you. Sit up or stand slowly to reduce the risk of dizzy or fainting spells. Drinking alcohol with this medication can increase the risk of these side effects. This medication may increase your risk of getting an infection. Call your care team for advice if you get a fever, chills, sore throat, or other symptoms of a cold or flu. Do not treat yourself. Try to avoid being around people who are sick. Avoid taking medications that contain aspirin, acetaminophen, ibuprofen, naproxen, or ketoprofen unless instructed by your care team. These medications may hide a fever. This medication may increase your risk to bruise or bleed. Call your care team if you notice any unusual bleeding. Be careful brushing or flossing your teeth or using a toothpick because you may get an infection or bleed more easily. If you have any dental work done, tell your dentist you are receiving this medication. Talk to your care team if you or your partner are pregnant or think either of you might be pregnant. This medication can cause serious birth defects if taken during pregnancy and for 6 months after the last dose. You will need a negative pregnancy test before starting this medication. Contraception is recommended while taking this medication and for 6 months after the last dose. Your care team can help you find the option that works for you. Do not father a child while taking this medication and for 3 months after the last dose. Use a condom for contraception during this time period. Do not breastfeed while taking this medication and for 7 days after the last dose. This medication may  cause infertility. Talk to your care team if you are concerned about your fertility. What side effects may I notice from receiving this medication? Side effects that you should report to your care team as soon as possible: Allergic reactions--skin rash, itching, hives, swelling of the face, lips, tongue, or throat Dry cough, shortness of breath or trouble breathing Increased saliva or tears, increased sweating, stomach cramping, diarrhea, small pupils, unusual weakness or fatigue, slow heartbeat Infection--fever, chills, cough, sore throat, wounds that don't heal, pain or trouble when passing urine, general feeling of discomfort or being unwell Kidney injury--decrease in the amount of urine, swelling of the ankles, hands, or feet Low red blood cell level--unusual weakness or fatigue, dizziness, headache, trouble breathing Severe or prolonged diarrhea Unusual bruising or bleeding Side effects that usually do not require medical attention (report to your care team if they continue or are bothersome): Constipation  Diarrhea Hair loss Loss of appetite Nausea Stomach pain This list may not describe all possible side effects. Call your doctor for medical advice about side effects. You may report side effects to FDA at 1-800-FDA-1088. Where should I keep my medication? This medication is given in a hospital or clinic. It will not be stored at home. NOTE: This sheet is a summary. It may not cover all possible information. If you have questions about this medicine, talk to your doctor, pharmacist, or health care provider.  2023 Elsevier/Gold Standard (2021-08-26 00:00:00)   Leucovorin Injection What is this medication? LEUCOVORIN (loo koe VOR in) prevents side effects from certain medications, such as methotrexate. It works by increasing folate levels. This helps protect healthy cells in your body. It may also be used to treat anemia caused by low levels of folate. It can also be used with  fluorouracil, a type of chemotherapy, to treat colorectal cancer. It works by increasing the effects of fluorouracil in the body. This medicine may be used for other purposes; ask your health care provider or pharmacist if you have questions. What should I tell my care team before I take this medication? They need to know if you have any of these conditions: Anemia from low levels of vitamin B12 in the blood An unusual or allergic reaction to leucovorin, folic acid, other medications, foods, dyes, or preservatives Pregnant or trying to get pregnant Breastfeeding How should I use this medication? This medication is injected into a vein or a muscle. It is given by your care team in a hospital or clinic setting. Talk to your care team about the use of this medication in children. Special care may be needed. Overdosage: If you think you have taken too much of this medicine contact a poison control center or emergency room at once. NOTE: This medicine is only for you. Do not share this medicine with others. What if I miss a dose? Keep appointments for follow-up doses. It is important not to miss your dose. Call your care team if you are unable to keep an appointment. What may interact with this medication? Capecitabine Fluorouracil Phenobarbital Phenytoin Primidone Trimethoprim;sulfamethoxazole This list may not describe all possible interactions. Give your health care provider a list of all the medicines, herbs, non-prescription drugs, or dietary supplements you use. Also tell them if you smoke, drink alcohol, or use illegal drugs. Some items may interact with your medicine. What should I watch for while using this medication? Your condition will be monitored carefully while you are receiving this medication. This medication may increase the side effects of 5-fluorouracil. Tell your care team if you have diarrhea or mouth sores that do not get better or that get worse. What side effects may I  notice from receiving this medication? Side effects that you should report to your care team as soon as possible: Allergic reactions--skin rash, itching, hives, swelling of the face, lips, tongue, or throat This list may not describe all possible side effects. Call your doctor for medical advice about side effects. You may report side effects to FDA at 1-800-FDA-1088. Where should I keep my medication? This medication is given in a hospital or clinic. It will not be stored at home. NOTE: This sheet is a summary. It may not cover all possible information. If you have questions about this medicine, talk to your doctor, pharmacist, or health care provider.  2023 Elsevier/Gold Standard (2021-09-21 00:00:00)  Fluorouracil Injection What is this medication? FLUOROURACIL (flure oh YOOR a  sil) treats some types of cancer. It works by slowing down the growth of cancer cells. This medicine may be used for other purposes; ask your health care provider or pharmacist if you have questions. COMMON BRAND NAME(S): Adrucil What should I tell my care team before I take this medication? They need to know if you have any of these conditions: Blood disorders Dihydropyrimidine dehydrogenase (DPD) deficiency Infection, such as chickenpox, cold sores, herpes Kidney disease Liver disease Poor nutrition Recent or ongoing radiation therapy An unusual or allergic reaction to fluorouracil, other medications, foods, dyes, or preservatives If you or your partner are pregnant or trying to get pregnant Breast-feeding How should I use this medication? This medication is injected into a vein. It is administered by your care team in a hospital or clinic setting. Talk to your care team about the use of this medication in children. Special care may be needed. Overdosage: If you think you have taken too much of this medicine contact a poison control center or emergency room at once. NOTE: This medicine is only for you. Do  not share this medicine with others. What if I miss a dose? Keep appointments for follow-up doses. It is important not to miss your dose. Call your care team if you are unable to keep an appointment. What may interact with this medication? Do not take this medication with any of the following: Live virus vaccines This medication may also interact with the following: Medications that treat or prevent blood clots, such as warfarin, enoxaparin, dalteparin This list may not describe all possible interactions. Give your health care provider a list of all the medicines, herbs, non-prescription drugs, or dietary supplements you use. Also tell them if you smoke, drink alcohol, or use illegal drugs. Some items may interact with your medicine. What should I watch for while using this medication? Your condition will be monitored carefully while you are receiving this medication. This medication may make you feel generally unwell. This is not uncommon as chemotherapy can affect healthy cells as well as cancer cells. Report any side effects. Continue your course of treatment even though you feel ill unless your care team tells you to stop. In some cases, you may be given additional medications to help with side effects. Follow all directions for their use. This medication may increase your risk of getting an infection. Call your care team for advice if you get a fever, chills, sore throat, or other symptoms of a cold or flu. Do not treat yourself. Try to avoid being around people who are sick. This medication may increase your risk to bruise or bleed. Call your care team if you notice any unusual bleeding. Be careful brushing or flossing your teeth or using a toothpick because you may get an infection or bleed more easily. If you have any dental work done, tell your dentist you are receiving this medication. Avoid taking medications that contain aspirin, acetaminophen, ibuprofen, naproxen, or ketoprofen unless  instructed by your care team. These medications may hide a fever. Do not treat diarrhea with over the counter products. Contact your care team if you have diarrhea that lasts more than 2 days or if it is severe and watery. This medication can make you more sensitive to the sun. Keep out of the sun. If you cannot avoid being in the sun, wear protective clothing and sunscreen. Do not use sun lamps, tanning beds, or tanning booths. Talk to your care team if you or your partner wish to  become pregnant or think you might be pregnant. This medication can cause serious birth defects if taken during pregnancy and for 3 months after the last dose. A reliable form of contraception is recommended while taking this medication and for 3 months after the last dose. Talk to your care team about effective forms of contraception. Do not father a child while taking this medication and for 3 months after the last dose. Use a condom while having sex during this time period. Do not breastfeed while taking this medication. This medication may cause infertility. Talk to your care team if you are concerned about your fertility. What side effects may I notice from receiving this medication? Side effects that you should report to your care team as soon as possible: Allergic reactions--skin rash, itching, hives, swelling of the face, lips, tongue, or throat Heart attack--pain or tightness in the chest, shoulders, arms, or jaw, nausea, shortness of breath, cold or clammy skin, feeling faint or lightheaded Heart failure--shortness of breath, swelling of the ankles, feet, or hands, sudden weight gain, unusual weakness or fatigue Heart rhythm changes--fast or irregular heartbeat, dizziness, feeling faint or lightheaded, chest pain, trouble breathing High ammonia level--unusual weakness or fatigue, confusion, loss of appetite, nausea, vomiting, seizures Infection--fever, chills, cough, sore throat, wounds that don't heal, pain or  trouble when passing urine, general feeling of discomfort or being unwell Low red blood cell level--unusual weakness or fatigue, dizziness, headache, trouble breathing Pain, tingling, or numbness in the hands or feet, muscle weakness, change in vision, confusion or trouble speaking, loss of balance or coordination, trouble walking, seizures Redness, swelling, and blistering of the skin over hands and feet Severe or prolonged diarrhea Unusual bruising or bleeding Side effects that usually do not require medical attention (report to your care team if they continue or are bothersome): Dry skin Headache Increased tears Nausea Pain, redness, or swelling with sores inside the mouth or throat Sensitivity to light Vomiting This list may not describe all possible side effects. Call your doctor for medical advice about side effects. You may report side effects to FDA at 1-800-FDA-1088. Where should I keep my medication? This medication is given in a hospital or clinic. It will not be stored at home. NOTE: This sheet is a summary. It may not cover all possible information. If you have questions about this medicine, talk to your doctor, pharmacist, or health care provider.  2023 [employer]/Gold Standard (2021-08-17 00:00:00)

## 2022-04-18 NOTE — Progress Notes (Signed)
Dose of bevacizumab calculated today at '550mg'$ .    Per Dr. Burr Medico, keep bevacizumab dose at '500mg'$ .  Laray Anger, PharmD PGY-2 Pharmacy Resident Hematology/Oncology [phone]  04/18/2022 12:22 PM

## 2022-04-19 ENCOUNTER — Telehealth: Payer: Self-pay | Admitting: Hematology

## 2022-04-19 ENCOUNTER — Ambulatory Visit (HOSPITAL_COMMUNITY): Payer: Medicare Other

## 2022-04-19 ENCOUNTER — Ambulatory Visit (HOSPITAL_COMMUNITY): Admission: RE | Admit: 2022-04-19 | Payer: Medicare Other | Source: Ambulatory Visit

## 2022-04-19 NOTE — Telephone Encounter (Signed)
Spoke with patient confirming upcoming appointments  

## 2022-04-20 ENCOUNTER — Inpatient Hospital Stay: Payer: Medicare Other

## 2022-04-20 ENCOUNTER — Other Ambulatory Visit: Payer: Self-pay

## 2022-04-20 VITALS — BP 149/60 | HR 66 | Temp 98.7°F | Resp 20

## 2022-04-20 DIAGNOSIS — C182 Malignant neoplasm of ascending colon: Secondary | ICD-10-CM

## 2022-04-20 DIAGNOSIS — Z5111 Encounter for antineoplastic chemotherapy: Secondary | ICD-10-CM | POA: Diagnosis not present

## 2022-04-20 MED ORDER — HEPARIN SOD (PORK) LOCK FLUSH 100 UNIT/ML IV SOLN
500.0000 [IU] | Freq: Once | INTRAVENOUS | Status: AC | PRN
  Administered 2022-04-20: 500 [IU]

## 2022-04-20 MED ORDER — SODIUM CHLORIDE 0.9% FLUSH
10.0000 mL | INTRAVENOUS | Status: DC | PRN
  Administered 2022-04-20: 10 mL

## 2022-04-20 MED ORDER — PEGFILGRASTIM-CBQV 6 MG/0.6ML ~~LOC~~ SOSY
6.0000 mg | PREFILLED_SYRINGE | Freq: Once | SUBCUTANEOUS | Status: AC
  Administered 2022-04-20: 6 mg via SUBCUTANEOUS
  Filled 2022-04-20: qty 0.6

## 2022-04-21 ENCOUNTER — Ambulatory Visit (HOSPITAL_COMMUNITY)
Admission: RE | Admit: 2022-04-21 | Discharge: 2022-04-21 | Disposition: A | Discharge status: Home / Self Care | Payer: Medicare Other | Source: Ambulatory Visit | Attending: Hematology | Admitting: Hematology

## 2022-04-21 DIAGNOSIS — J432 Centrilobular emphysema: Secondary | ICD-10-CM | POA: Diagnosis not present

## 2022-04-21 DIAGNOSIS — R918 Other nonspecific abnormal finding of lung field: Secondary | ICD-10-CM | POA: Diagnosis not present

## 2022-04-21 DIAGNOSIS — C182 Malignant neoplasm of ascending colon: Secondary | ICD-10-CM

## 2022-04-21 DIAGNOSIS — K839 Disease of biliary tract, unspecified: Secondary | ICD-10-CM | POA: Diagnosis not present

## 2022-04-21 DIAGNOSIS — C189 Malignant neoplasm of colon, unspecified: Secondary | ICD-10-CM | POA: Diagnosis not present

## 2022-04-21 DIAGNOSIS — M419 Scoliosis, unspecified: Secondary | ICD-10-CM | POA: Diagnosis not present

## 2022-04-21 DIAGNOSIS — N281 Cyst of kidney, acquired: Secondary | ICD-10-CM | POA: Diagnosis not present

## 2022-04-21 MED ORDER — GADOBUTROL 1 MMOL/ML IV SOLN
10.0000 mL | Freq: Once | INTRAVENOUS | Status: AC | PRN
  Administered 2022-04-21: 10 mL via INTRAVENOUS

## 2022-04-27 ENCOUNTER — Other Ambulatory Visit: Payer: Self-pay | Admitting: Hematology

## 2022-04-29 MED FILL — Dexamethasone Sodium Phosphate Inj 100 MG/10ML: INTRAMUSCULAR | Qty: 1 | Status: AC

## 2022-05-03 ENCOUNTER — Telehealth: Payer: Self-pay

## 2022-05-03 ENCOUNTER — Other Ambulatory Visit: Payer: Self-pay

## 2022-05-03 ENCOUNTER — Other Ambulatory Visit: Payer: Self-pay | Admitting: Hematology

## 2022-05-03 ENCOUNTER — Inpatient Hospital Stay: Payer: Medicare Other | Admitting: Physician Assistant

## 2022-05-03 ENCOUNTER — Inpatient Hospital Stay: Payer: Medicare Other

## 2022-05-03 ENCOUNTER — Encounter: Payer: Self-pay | Admitting: Hematology

## 2022-05-03 ENCOUNTER — Inpatient Hospital Stay: Payer: Medicare Other | Attending: Hematology

## 2022-05-03 VITALS — BP 160/72 | HR 71 | Temp 98.2°F | Resp 18 | Wt 236.0 lb

## 2022-05-03 DIAGNOSIS — Z5189 Encounter for other specified aftercare: Secondary | ICD-10-CM | POA: Insufficient documentation

## 2022-05-03 DIAGNOSIS — D539 Nutritional anemia, unspecified: Secondary | ICD-10-CM

## 2022-05-03 DIAGNOSIS — R197 Diarrhea, unspecified: Secondary | ICD-10-CM | POA: Diagnosis not present

## 2022-05-03 DIAGNOSIS — C182 Malignant neoplasm of ascending colon: Secondary | ICD-10-CM

## 2022-05-03 DIAGNOSIS — Z85828 Personal history of other malignant neoplasm of skin: Secondary | ICD-10-CM | POA: Diagnosis not present

## 2022-05-03 DIAGNOSIS — Z923 Personal history of irradiation: Secondary | ICD-10-CM | POA: Diagnosis not present

## 2022-05-03 DIAGNOSIS — Z5111 Encounter for antineoplastic chemotherapy: Secondary | ICD-10-CM | POA: Diagnosis not present

## 2022-05-03 DIAGNOSIS — Z95828 Presence of other vascular implants and grafts: Secondary | ICD-10-CM

## 2022-05-03 DIAGNOSIS — C786 Secondary malignant neoplasm of retroperitoneum and peritoneum: Secondary | ICD-10-CM | POA: Insufficient documentation

## 2022-05-03 DIAGNOSIS — I10 Essential (primary) hypertension: Secondary | ICD-10-CM | POA: Diagnosis not present

## 2022-05-03 DIAGNOSIS — Z853 Personal history of malignant neoplasm of breast: Secondary | ICD-10-CM | POA: Diagnosis not present

## 2022-05-03 DIAGNOSIS — E1140 Type 2 diabetes mellitus with diabetic neuropathy, unspecified: Secondary | ICD-10-CM | POA: Diagnosis not present

## 2022-05-03 DIAGNOSIS — C787 Secondary malignant neoplasm of liver and intrahepatic bile duct: Secondary | ICD-10-CM | POA: Insufficient documentation

## 2022-05-03 DIAGNOSIS — C7963 Secondary malignant neoplasm of bilateral ovaries: Secondary | ICD-10-CM | POA: Insufficient documentation

## 2022-05-03 LAB — CBC WITH DIFFERENTIAL (CANCER CENTER ONLY)
Abs Immature Granulocytes: 3.13 10*3/uL — ABNORMAL HIGH (ref 0.00–0.07)
Basophils Absolute: 0.1 10*3/uL (ref 0.0–0.1)
Basophils Relative: 1 %
Eosinophils Absolute: 0 10*3/uL (ref 0.0–0.5)
Eosinophils Relative: 0 %
HCT: 25.5 % — ABNORMAL LOW (ref 36.0–46.0)
Hemoglobin: 8.3 g/dL — ABNORMAL LOW (ref 12.0–15.0)
Immature Granulocytes: 17 %
Lymphocytes Relative: 24 %
Lymphs Abs: 4.4 10*3/uL — ABNORMAL HIGH (ref 0.7–4.0)
MCH: 36.7 pg — ABNORMAL HIGH (ref 26.0–34.0)
MCHC: 32.5 g/dL (ref 30.0–36.0)
MCV: 112.8 fL — ABNORMAL HIGH (ref 80.0–100.0)
Monocytes Absolute: 1.6 10*3/uL — ABNORMAL HIGH (ref 0.1–1.0)
Monocytes Relative: 8 %
Neutro Abs: 9.4 10*3/uL — ABNORMAL HIGH (ref 1.7–7.7)
Neutrophils Relative %: 50 %
Platelet Count: 189 10*3/uL (ref 150–400)
RBC: 2.26 MIL/uL — ABNORMAL LOW (ref 3.87–5.11)
RDW: 19.8 % — ABNORMAL HIGH (ref 11.5–15.5)
WBC Count: 18.7 10*3/uL — ABNORMAL HIGH (ref 4.0–10.5)
nRBC: 1.7 % — ABNORMAL HIGH (ref 0.0–0.2)

## 2022-05-03 LAB — CMP (CANCER CENTER ONLY)
ALT: 16 U/L (ref 0–44)
AST: 30 U/L (ref 15–41)
Albumin: 3.6 g/dL (ref 3.5–5.0)
Alkaline Phosphatase: 116 U/L (ref 38–126)
Anion gap: 7 (ref 5–15)
BUN: 11 mg/dL (ref 8–23)
CO2: 27 mmol/L (ref 22–32)
Calcium: 9 mg/dL (ref 8.9–10.3)
Chloride: 109 mmol/L (ref 98–111)
Creatinine: 0.73 mg/dL (ref 0.44–1.00)
GFR, Estimated: >60 mL/min (ref >60)
Glucose, Bld: 139 mg/dL — ABNORMAL HIGH (ref 70–99)
Potassium: 3.9 mmol/L (ref 3.5–5.1)
Sodium: 143 mmol/L (ref 135–145)
Total Bilirubin: 0.9 mg/dL (ref 0.3–1.2)
Total Protein: 6.1 g/dL — ABNORMAL LOW (ref 6.5–8.1)

## 2022-05-03 LAB — FOLATE: Folate: >40 ng/mL (ref >5.9)

## 2022-05-03 LAB — VITAMIN B12: Vitamin B-12: 3709 pg/mL — ABNORMAL HIGH (ref 180–914)

## 2022-05-03 LAB — CEA (IN HOUSE-CHCC): CEA (CHCC-In House): 10.29 ng/mL — ABNORMAL HIGH (ref 0.00–5.00)

## 2022-05-03 MED ORDER — SODIUM CHLORIDE 0.9% FLUSH: 10.0000 mL | INTRAVENOUS | Status: DC | PRN

## 2022-05-03 MED ORDER — SODIUM CHLORIDE 0.9 % IV SOLN
130.0000 mg/m2 | Freq: Once | INTRAVENOUS | Status: AC
  Administered 2022-05-03: 300 mg via INTRAVENOUS
  Filled 2022-05-03: qty 15

## 2022-05-03 MED ORDER — PALONOSETRON HCL INJECTION 0.25 MG/5ML
0.2500 mg | Freq: Once | INTRAVENOUS | Status: AC
  Administered 2022-05-03: 0.25 mg via INTRAVENOUS
  Filled 2022-05-03: qty 5

## 2022-05-03 MED ORDER — SODIUM CHLORIDE 0.9 % IV SOLN: Freq: Once | INTRAVENOUS | Status: DC

## 2022-05-03 MED ORDER — ATROPINE SULFATE 1 MG/ML IV SOLN
0.5000 mg | Freq: Once | INTRAVENOUS | Status: AC | PRN
  Administered 2022-05-03: 0.5 mg via INTRAVENOUS
  Filled 2022-05-03: qty 1

## 2022-05-03 MED ORDER — SODIUM CHLORIDE 0.9 % IV SOLN
10.0000 mg | Freq: Once | INTRAVENOUS | Status: AC
  Administered 2022-05-03: 10 mg via INTRAVENOUS
  Filled 2022-05-03: qty 10

## 2022-05-03 MED ORDER — HEPARIN SOD (PORK) LOCK FLUSH 100 UNIT/ML IV SOLN: 500.0000 [IU] | Freq: Once | INTRAVENOUS | Status: DC | PRN

## 2022-05-03 MED ORDER — SODIUM CHLORIDE 0.9 % IV SOLN
5.0000 mg/kg | Freq: Once | INTRAVENOUS | Status: AC
  Administered 2022-05-03: 500 mg via INTRAVENOUS
  Filled 2022-05-03: qty 16

## 2022-05-03 MED ORDER — SODIUM CHLORIDE 0.9 % IV SOLN: Freq: Once | INTRAVENOUS | Status: AC

## 2022-05-03 MED ORDER — SODIUM CHLORIDE 0.9 % IV SOLN
150.0000 mg/m2 | Freq: Once | INTRAVENOUS | Status: DC
  Filled 2022-05-03: qty 17

## 2022-05-03 MED ORDER — SODIUM CHLORIDE 0.9 % IV SOLN
400.0000 mg/m2 | Freq: Once | INTRAVENOUS | Status: AC
  Administered 2022-05-03: 896 mg via INTRAVENOUS
  Filled 2022-05-03: qty 44.8

## 2022-05-03 MED ORDER — FLUOROURACIL CHEMO INJECTION 2.5 GM/50ML
400.0000 mg/m2 | Freq: Once | INTRAVENOUS | Status: AC
  Administered 2022-05-03: 900 mg via INTRAVENOUS
  Filled 2022-05-03: qty 18

## 2022-05-03 MED ORDER — DIPHENOXYLATE-ATROPINE 2.5-0.025 MG PO TABS: 1.0000 | ORAL_TABLET | Freq: Four times a day (QID) | ORAL | 0 refills | Status: DC | PRN

## 2022-05-03 MED ORDER — SODIUM CHLORIDE 0.9% FLUSH
10.0000 mL | INTRAVENOUS | Status: DC | PRN
  Administered 2022-05-03: 10 mL

## 2022-05-03 MED ORDER — SODIUM CHLORIDE 0.9 % IV SOLN
5000.0000 mg | INTRAVENOUS | Status: DC
  Administered 2022-05-03: 5000 mg via INTRAVENOUS
  Filled 2022-05-03: qty 100

## 2022-05-03 NOTE — Progress Notes (Signed)
Decrease irinotecan '130mg'$ /m2 today and future doses per Dr Burr Medico

## 2022-05-03 NOTE — Patient Instructions (Signed)
[facility]  Discharge Instructions: Thank you for choosing [facility] to provide your oncology and hematology care.   If you have a lab appointment with the [facility], please go directly to the [facility] and check in at the registration area.   Wear comfortable clothing and clothing appropriate for easy access to any Portacath or PICC line.   We strive to give you quality time with your provider. You may need to reschedule your appointment if you arrive late (15 or more minutes).  Arriving late affects you and other patients whose appointments are after yours.  Also, if you miss three or more appointments without notifying the office, you may be dismissed from the clinic at the provider's discretion.      For prescription refill requests, have your pharmacy contact our office and allow 72 hours for refills to be completed.    Today you received the following chemotherapy and/or immunotherapy agents: Bevacizumab, Irinotecan, Leucovorin, Fluorouracil.       To help prevent nausea and vomiting after your treatment, we encourage you to take your nausea medication as directed.  BELOW ARE SYMPTOMS THAT SHOULD BE REPORTED IMMEDIATELY: *FEVER GREATER THAN 100.4 F (38 C) OR HIGHER *CHILLS OR SWEATING *NAUSEA AND VOMITING THAT IS NOT CONTROLLED WITH YOUR NAUSEA MEDICATION *UNUSUAL SHORTNESS OF BREATH *UNUSUAL BRUISING OR BLEEDING *URINARY PROBLEMS (pain or burning when urinating, or frequent urination) *BOWEL PROBLEMS (unusual diarrhea, constipation, pain near the anus) TENDERNESS IN MOUTH AND THROAT WITH OR WITHOUT PRESENCE OF ULCERS (sore throat, sores in mouth, or a toothache) UNUSUAL RASH, SWELLING OR PAIN  UNUSUAL VAGINAL DISCHARGE OR ITCHING   Items with * indicate a potential emergency and should be followed up as soon as possible or go to the Emergency Department if any problems should occur.  Please show the CHEMOTHERAPY ALERT CARD  or IMMUNOTHERAPY ALERT CARD at check-in to the Emergency Department and triage nurse.  Should you have questions after your visit or need to cancel or reschedule your appointment, please contact [facility]  Dept: [phone]  and follow the prompts.  Office hours are 8:00 a.m. to 4:30 p.m. Monday - Friday. Please note that voicemails left after 4:00 p.m. may not be returned until the following business day.  We are closed weekends and major holidays. You have access to a nurse at all times for urgent questions. Please call the main number to the clinic Dept: [phone] and follow the prompts.   For any non-urgent questions, you may also contact your provider using MyChart. We now offer e-Visits for anyone 14 and older to request care online for non-urgent symptoms. For details visit [email or web address].   Also download the MyChart app! Go to the app store, search "MyChart", open the app, select [facility], and log in with your MyChart username and password.

## 2022-05-03 NOTE — Progress Notes (Signed)
[place]   Telephone:([phone] [phone]   Clinic Follow up Note   Patient Care Team: [name], MD as PCP - General (Family Medicine) Rolm Bookbinder, MD as Consulting Physician (General Surgery) Truitt Merle, MD as Consulting Physician (Hematology) Leighton Ruff, MD as Consulting Physician (General Surgery) Nicholas Lose, MD as Consulting Physician (Hematology and Oncology)  Date of Service:  05/03/2022  CHIEF COMPLAINT: f/u of right colon cancer and h/o DCIS   CURRENT THERAPY:  Second line FOLFIRI/beva started on 04/04/22  SUMMARY OF ONCOLOGIC HISTORY: Oncology History Overview Note   Cancer Staging  Malignant neoplasm of female breast [facility]) Staging form: Breast, AJCC 7th Edition - Clinical stage from 06/11/2014: Stage 0 (Tis (DCIS), N0, M0) - Unsigned Staged by: Pathologist and managing physician Laterality: Left Estrogen receptor status: Positive Progesterone receptor status: Positive Stage used in treatment planning: Yes National guidelines used in treatment planning: Yes Type of national guideline used in treatment planning: NCCN - Pathologic stage from 07/03/2014: Stage Unknown (Tis (DCIS), NX, cM0) - Signed by Enid Cutter, MD on 07/10/2014 Staged by: Pathologist Laterality: Left Estrogen receptor status: Positive Progesterone receptor status: Positive Stage used in treatment planning: Yes National guidelines used in treatment planning: Yes Type of national guideline used in treatment planning: NCCN Staging comments: Staged on final lumpectomy specimen by Dr. Donato Heinz.  Right colon cancer Staging form: Colon and Rectum, AJCC 8th Edition - Pathologic stage from 11/01/2019: Stage IIIB (pT3, pN2a, cM0) - Signed by Truitt Merle, MD on 11/29/2019 Stage prefix: Initial diagnosis Histologic grading system: 4 grade system Histologic grade (G): G2 Residual tumor (R): R0 - None Tumor deposits (TD): Absent Perineural invasion (PNI):  Absent Microsatellite instability (MSI): Stable KRAS mutation: Unknown NRAS mutation: Unknown BRAF mutation: Unknown     Malignant neoplasm of female breast ([place])  05/29/2014 Initial Biopsy   Left breast needle core biopsy: Grade 2, DCIS with calcs. ER+ (100%), PR+ (96%).    06/04/2014 Initial Diagnosis   Left breast DCIS with calcifications, ER 100%, PR 96%   06/10/2014 Breast MRI   Left breast: 2.4 x 1.3 x 1.1 cm area of patchy non-mass enhancement upper outer quadrant includes postbiopsy seroma; Right breast: 1.2 cm previously biopsied stable benign fibroadenoma   06/12/2014 Procedure   Genetic counseling/testing: Identified 1 VUS on CHEK2 gene. Remainder of 17 gene panel tested negative and included: ATM, BARD1, BRCA 1/2, BRIP1, CDH1, CHEK2, EPCAM, MLH1, MSH2, MSH6, NBN, NF1, PALB2, PTEN, RAD50, RAD51C, RAD51D, STK11, and TP53.    07/01/2014 Surgery   Left breast lumpectomy ([place]): Grade 1, DCIS, spanning 2.3 cm, 1 mm margin, ER 100%, PR 96%   07/31/2014 - 08/28/2014 Radiation Therapy   Adjuvant RT completed Pablo Ledger). Left breast: Total dose 42.5 Gy over 17 fractions. Left breast boost: Total dose 7.5 Gy over 3 fractions.    09/14/2014 - 01/13/2020 Anti-estrogen oral therapy   Anastrazole 64m daily. Planned duration of treatment: 5 years ([place]. Completed in 01/2020.    09/25/2014 Survivorship   Survivorship Care Plan given to patient and reviewed with her in person.    03/02/2021 Imaging   CT CAP  IMPRESSION: 1. No findings of active/recurrent malignancy. Partial right hemicolectomy. 2. Endometrial stripe remains mildly thickened, but endometrial biopsy in May was negative for malignancy. 3. Progressive endplate sclerosis and endplate irregularity at T2-3, probably due to degenerative endplate findings. If the has referable upper thoracic pain/symptoms then thoracic spine MRI could be used for further workup. 4. Other imaging findings  of potential clinical significance:  Mild cardiomegaly. Aortic Atherosclerosis (ICD10-I70.0). Mild mitral valve calcification. Postoperative findings in the left breast with adjacent radiation port anteriorly in the left upper lobe. Tiny pulmonary nodules in the left lower lobe are unchanged from earliest available comparison of 10/17/2019 and probably benign although may merit surveillance. Multilevel lumbar impingement. Mild pelvic floor laxity.   Right colon cancer  09/19/2019 Imaging   CT AP W contrast 09/19/19  IMPRESSION Fullness in the cecum, cannot exclude a mass. No evidence for metastatic disease is identified.    09/24/2019 Procedure   Colonoscopy by Dr Eber Jones 09/24/19 IMPRESSION 1. The colon was redundant  2. Mild diverticulosis was noted through the entire examined colon 3. Single 39m polyp was found in the ascending colon; polypectomy was performed using snare cautery and biopsy forceps 4. Mild diverticulosis was notes in the descending colon and sigmoid colon.  5. Single polyp was found in the sigmoid colon, polypectomy was performed with cold forceps.  6. Single polyp was found in the rectosigmoid colon; polypectomy was performed with cold snare  7. Small internal hemorrhoids  8. Large mass was found at the cecum; multiple biopsies of the area were performed using cold forceps; injection (tattooing) was performed distal to the mass.    09/24/2019 Initial Biopsy   INTERPRETATION AND DIAGNOSIS:  A. Cecum, biopsy:  Invasive moderately differentiated adenocarcinoma.  see comment  B. Polyp @ ascending colon, polypectomy:  Tubular Adenoma  C. Polyp @ sigmoid colon Polypectomy:  hyperplastic polyp.  D. Polyp @ rectosigmoid colon, Polypectomy:  Hyperplastic Polyp      10/16/2019 Imaging   CT Chest IMPRESSION: 1. Multiple small pulmonary nodules measuring 5 mm or less in size in the lungs. These are nonspecific and are typically considered statistically likely benign. However, given the patient's history  of primary malignancy, close attention on follow-up studies is recommended to ensure stability. 2. Aortic atherosclerosis, in addition to right coronary artery disease. Assessment for potential risk factor modification, dietary therapy or pharmacologic therapy may be warranted, if clinically indicated. 3. There are calcifications of the aortic valve and mitral annulus. Echocardiographic correlation for evaluation of potential valvular dysfunction may be warranted if clinically indicated. 4. Small hiatal hernia.   Aortic Atherosclerosis (ICD10-I70.0).   11/01/2019 Initial Diagnosis   [place]   11/01/2019 Surgery   LAPAROSCOPIC PARTIAL COLECTOMY by Dr TMarcello Mooresand Dr GJohney Maine  11/01/2019 Pathology Results   FINAL MICROSCOPIC DIAGNOSIS:   A. COLON, PROXIMAL RIGHT, COLECTOMY:  - Invasive colonic adenocarcinoma, 5 cm.  - Tumor invades through the muscularis propria into pericolonic tissues.   - Margins of resection are not involved.  - Metastatic carcinoma in (5) of (13) lymph nodes.  - See oncology table.    MSI Stable  Mismatch repair normal  MLH1 - Preserved nuclear expression (greater 50% tumor expression) MSH2 - Preserved nuclear expression (greater 50% tumor expression) MSH6 - Preserved nuclear expression (greater 50% tumor expression) PMS2 - Preserved nuclear expression (greater 50% tumor expression)   11/01/2019 Cancer Staging   Staging form: Colon and Rectum, AJCC 8th Edition - Pathologic stage from 11/01/2019: Stage IIIB (pT3, pN2a, cM0) - Signed by FTruitt Merle MD on 11/29/2019   12/10/2019 Procedure   PAC placed 12/10/19   12/17/2019 - 06/01/2020 Chemotherapy   FOLFOX q2weeks starting in 2 weeks starting 12/17/19. Held 01/27/20-02/10/20 due to b/l PE. Oxaliplatin held C11-12 due to neuropathy. Completed on 06/01/20   03/02/2021 Imaging   CT CAP  IMPRESSION: 1.  No findings of active/recurrent malignancy. Partial right hemicolectomy. 2. Endometrial stripe remains mildly  thickened, but endometrial biopsy in May was negative for malignancy. 3. Progressive endplate sclerosis and endplate irregularity at T2-3, probably due to degenerative endplate findings. If the has referable upper thoracic pain/symptoms then thoracic spine MRI could be used for further workup. 4. Other imaging findings of potential clinical significance: Mild cardiomegaly. Aortic Atherosclerosis (ICD10-I70.0). Mild mitral valve calcification. Postoperative findings in the left breast with adjacent radiation port anteriorly in the left upper lobe. Tiny pulmonary nodules in the left lower lobe are unchanged from earliest available comparison of 10/17/2019 and probably benign although may merit surveillance. Multilevel lumbar impingement. Mild pelvic floor laxity.   08/26/2021 Imaging   EXAM: CT CHEST, ABDOMEN, AND PELVIS WITH CONTRAST  IMPRESSION: 1. Stable examination without new or progressive findings to suggest local recurrence or metastatic disease within the chest, abdomen, or pelvis. 2. Hepatomegaly with hepatic steatosis. 3. Sigmoid colonic diverticulosis without findings of acute diverticulitis. 4. Similar prominent endplate sclerosis and irregularity at T2-T3 is most consistent with Modic type endplate degenerative changes. However, if patient has referable upper thoracic pain consider further workup with thoracic spine MRI. 5. Similar thickening of the endometrial stripe measuring up to 8 mm, which was previously biopsied with results negative for malignancy. 6. Aortic Atherosclerosis (ICD10-I70.0).   11/10/2021 PET scan   IMPRESSION: 1. LEFT ovary is increased in size and is intensely hypermetabolic. While physiologic hypermetabolic ovarian tissue is not uncommon, the enlargement and asymmetric activity warrants further evaluation. Consider contrast pelvic MRI vs tissue sampling. 2. No evidence of metastatic colorectal carcinoma otherwise. 3. Post RIGHT hemicolectomy  anatomy. 4. Evidence of radiation change in the LEFT upper lobe (remote breast cancer).     12/22/2021 Relapse/Recurrence    FINAL MICROSCOPIC DIAGNOSIS:   A. LEFT OVARY AND FALLOPIAN TUBE, SALPINGO OOPHORECTOMY:  - Metastatic moderately differentiated colonic adenocarcinoma involving left ovary  - Focal ovarian stromal calcification  - Segment of benign fallopian tube   B. UTERUS WITH RIGHT FALLOPIAN TUBE AND OVARY, HYSTERECTOMY AND SALPINGO-OOPHORECTOMY:  - Metastatic moderately differentiated colonic adenocarcinoma involving right ovary  - Focal invasive extensive adenomyosis  - Benign endometrial polyps  - Benign proliferative phase endometrium  - Hydrosalpinx of right fallopian tube  - Focal ovarian stromal calcification   C. PERITONEAL DEPOSITS, ANTERIOR CUL DE SAC, BIOPSY:  - Metastatic mucinous adenocarcinoma, consistent with colorectal primary    COMMENT:  Immunohistochemical stains show that the tumor cells are positive for CK20 and CDX2 while they are negative for CK7 and PAX8, consistent with above interpretation.    01/24/2022 - 03/23/2022 Chemotherapy   Patient is on Treatment Plan : COLORECTAL FOLFOX q14d x 3 months     04/01/2022 Imaging   CT AP IMPRESSION: 1. New omental metastatic disease. 2. Vague hypoattenuating lesion in the inferior right hepatic lobe is new and also worrisome for metastatic disease. 3. Similar small right lower lobe nodules. Recommend continued attention on follow-up. 4. Steatotic enlarged liver. 5.  Aortic atherosclerosis (ICD10-I70.0).   04/06/2022 -  Chemotherapy   Patient is on Treatment Plan : COLORECTAL FOLFIRI + Bevacizumab q14d        INTERVAL HISTORY:  Kim Castro is here for a follow up of right colon cancer and h/o DCIS  She was last seen by Dr. Burr Medico on 04/18/22. She was seen in the infusion area today.   Ms. Sekula reports worsening fatigue since the last cycle but she tries to stay active  with her normal routines.  She does need to rest more frequently. Her appetite is is stable without any noticeable weight changes. She has mild nausea without vomiting episodes. She denies any abdominal pain. She reports worsening diarrhea with up to 5 episodes per day. She takes imodium as prescribed with improvement of symptoms. She does have hemorrhoids due to the diarrhea causes pain with defecation. She denies easy bruising or signs of active bleeding. She has stable neuropathy in her fingers and feet without any interference with grip or balance. She denies fevers, chills, sweats, shortness of breath, chest pain or cough. She has no other complaints.   All other systems were reviewed with the patient and are negative.  MEDICAL HISTORY:  Past Medical History:  Diagnosis Date   Aortic atherosclerosis (HCC)    Arthritis    feet, lower back   Basal cell carcinoma    arm   Breast cancer of upper-outer quadrant of left female breast ([place]) 06/04/2014   Cataract    immature on the left   Colon cancer ([place]) 08/2019   Diabetes mellitus without complication (HCC)    Diverticulosis    Dizziness    > 75yr ago;took Antivert    Family history of anesthesia complication    sister slow to wake up with anesthesia   Family history of breast cancer    Family history of colon cancer    Family history of uterine cancer    GERD (gastroesophageal reflux disease)    takes occasional TUMs   History of bronchitis    > 89[place][age]   History of colon polyps    History of hiatal hernia    Small noted on CT   History of pulmonary embolus (PE)    Hypertension    takes Losartan daily and HCTZ   Iron deficiency anemia    Joint pain    Numbness    to toes on each foot   Peripheral neuropathy    feet and toes   Personal history of radiation therapy    Pulmonary nodules    Noted on CT   Radiation 07/31/14-08/28/14   Left Breast 20 fxs   Seasonal allergies    takes Claritin prn   Urinary frequency    Vitamin D deficiency     takes VIt D daily    SURGICAL HISTORY: Past Surgical History:  Procedure Laterality Date   BREAST BIOPSY Bilateral    BREAST LUMPECTOMY Left    BREAST LUMPECTOMY WITH RADIOACTIVE SEED LOCALIZATION Left 07/01/2014   Procedure: LEFT BREAST LUMPECTOMY WITH RADIOACTIVE SEED LOCALIZATION;  Surgeon: BeExcell SeltzerMD;  [facility] Service: General;  Laterality: Left;   CATARACT EXTRACTION Right    COLONOSCOPY  09/24/2019   [place]   LAPAROSCOPIC PARTIAL COLECTOMY N/A 11/01/2019   Procedure: LAPAROSCOPIC PARTIAL COLECTOMY;  Surgeon: ThLeighton RuffMD;  Location: [facility];  Service: General;  Laterality: N/A;   POLYPECTOMY     PORTACATH PLACEMENT N/A 12/10/2019   Procedure: INSERTION PORT-A-CATH ULTRASOUND GUIDED IN RIGHT IJ;  Surgeon: ThLeighton RuffMD;  Location: [facility];  Service: General;  Laterality: N/A;   ROBOTIC ASSISTED TOTAL HYSTERECTOMY Bilateral 12/22/2021   Procedure: XI ROBOTIC ASSISTED TOTAL HYSTERECTOMY WITH BILATERAL SALPINGO OOPHORECTOMY;  Surgeon: TuLafonda MossesMD;  Location: [facility];  Service: Gynecology;  Laterality: Bilateral;   TOTAL KNEE ARTHROPLASTY Left 10/24/2012   Procedure: TOTAL KNEE ARTHROPLASTY;  Surgeon: FrKerin SalenMD;  [place] [facility] Service: Orthopedics;  Laterality: Left;   TOTAL KNEE ARTHROPLASTY Right 01/09/2013   Procedure: TOTAL KNEE ARTHROPLASTY;  Surgeon: Kerin Salen, MD;  Location: [place];  Service: Orthopedics;  Laterality: Right;   TUBAL LIGATION      I have reviewed the social history and family history with the patient and they are unchanged from previous note.  ALLERGIES:  is allergic to oxycodone.  MEDICATIONS:  Current Outpatient Medications  Medication Sig Dispense Refill   diphenoxylate-atropine (LOMOTIL) 2.5-0.025 MG tablet Take 1 tablet by mouth 4 (four) times daily as needed for diarrhea or loose stools. 120 tablet 0   acetaminophen (TYLENOL) 325 MG tablet Take 650 mg by mouth every 6 (six)  hours as needed for moderate pain.     b complex vitamins capsule Take 1 capsule by mouth daily.     Cholecalciferol (VITAMIN D) 2000 UNITS CAPS Take 2,000 Units by mouth daily.      Ibuprofen 200 MG CAPS Take by mouth.     loratadine (CLARITIN) 10 MG tablet Take 10 mg by mouth daily as needed for allergies.     losartan (COZAAR) 25 MG tablet Take 1 tablet (25 mg total) by mouth daily. 90 tablet 3   metFORMIN (GLUCOPHAGE) 500 MG tablet Take 1 tablet (500 mg total) by mouth in the morning and at bedtime. 180 tablet 3   Multiple Vitamins-Minerals (MULTIVITAMIN WITH MINERALS) tablet Take 1 tablet by mouth daily.     omeprazole (PRILOSEC) 20 MG capsule Take 20 mg by mouth daily before breakfast.      potassium chloride (KLOR-CON M) 10 MEQ tablet Take 1 tablet (10 mEq total) by mouth daily. 30 tablet 1   pravastatin (PRAVACHOL) 10 MG tablet Take 1 tablet (10 mg total) by mouth daily. 90 tablet 3   pregabalin (LYRICA) 25 MG capsule Take 1 capsule (25 mg total) by mouth 2 (two) times daily. 60 capsule 0   No current facility-administered medications for this visit.   Facility-Administered Medications Ordered in Other Visits  Medication Dose Route Frequency Provider Last Rate Last Admin   0.9 %  sodium chloride infusion   Intravenous Once Truitt Merle, MD       fluorouracil (ADRUCIL) 5,000 mg in sodium chloride 0.9 % 150 mL chemo infusion  5,000 mg Intravenous 1 day or 1 dose Truitt Merle, MD       fluorouracil (ADRUCIL) chemo injection 900 mg  400 mg/m2 (Treatment Plan Recorded) Intravenous Once Truitt Merle, MD       heparin lock flush 100 unit/mL  500 Units Intracatheter Once PRN Truitt Merle, MD       irinotecan (CAMPTOSAR) 300 mg in sodium chloride 0.9 % 500 mL chemo infusion  130 mg/m2 (Treatment Plan Recorded) Intravenous Once Truitt Merle, MD 343 mL/hr at 05/03/22 1105 300 mg at 05/03/22 1105   leucovorin 896 mg in sodium chloride 0.9 % 250 mL infusion  400 mg/m2 (Treatment Plan Recorded) Intravenous Once  Truitt Merle, MD 197 mL/hr at 05/03/22 1107 896 mg at 05/03/22 1107   sodium chloride flush (NS) 0.9 % injection 10 mL  10 mL Intracatheter PRN Truitt Merle, MD        PHYSICAL EXAMINATION: ECOG PERFORMANCE STATUS: 1 - Symptomatic but completely ambulatory  There were no vitals filed for this visit.  Wt Readings from Last 3 Encounters:  05/03/22 236 lb (107 kg)  04/18/22 243 lb 1.6 oz (110.3 kg)  04/04/22 239 lb 3.2 oz (108.5 kg)    Constitutional: Oriented to person,  place, and time and well-developed, well-nourished, and in no distress.  HENT:  Head: Normocephalic and atraumatic.  Eyes: Conjunctivae are normal. Right eye exhibits no discharge. Left eye exhibits no discharge. No scleral icterus. . Cardiovascular: Normal rate, regular rhythm, normal heart sounds Pulmonary/Chest: Effort normal and breath sounds normal. No respiratory distress. No wheezes. No rales.  Abdominal: Soft. Bowel sounds are normal. Exhibits no distension and no mass. There is no tenderness.  Lymphadenopathy: No cervical adenopathy.  Neurological: Alert and oriented to person, place, and time. Exhibits normal muscle tone.  Skin: Skin is warm and dry. No rash noted. Not diaphoretic. No erythema. No pallor.  Psychiatric: Mood, memory and judgment normal.    LABORATORY DATA:  I have reviewed the data as listed    Latest Ref Rng & Units 05/03/2022    8:51 AM 04/18/2022   10:18 AM 04/04/2022    9:18 AM  CBC  WBC 4.0 - 10.5 K/uL 18.7  9.9  3.6   Hemoglobin 12.0 - 15.0 g/dL 8.3  9.4  10.1   Hematocrit 36.0 - 46.0 % 25.5  28.1  30.7   Platelets 150 - 400 K/uL 189  100  138         Latest Ref Rng & Units 05/03/2022    8:51 AM 04/18/2022   10:18 AM 04/04/2022    9:18 AM  CMP  Glucose 70 - 99 mg/dL 139  167  149   BUN 8 - 23 mg/dL _0 Creatinine 0.44 - 1.00 mg/dL 0.73  0.78  0.69   Sodium 135 - 145 mmol/L 143  144  143   Potassium 3.5 - 5.1 mmol/L 3.9  3.2  3.6   Chloride 98 - 111 mmol/L 109  110  112    CO2 22 - 32 mmol/L _1 Calcium 8.9 - 10.3 mg/dL 9.0  9.7  9.4   Total Protein 6.5 - 8.1 g/dL 6.1  5.8  6.5   Total Bilirubin 0.3 - 1.2 mg/dL 0.9  1.0  1.1   Alkaline Phos 38 - 126 U/L 116  129  98   AST 15 - 41 U/L 30  45  44   ALT 0 - 44 U/L 16  27  32       RADIOGRAPHIC STUDIES: I have personally reviewed the radiological images as listed and agreed with the findings in the report. No results found.   ASSESSMENT:  Sharne Linders is a 77 y.o. female who presents to the clinic for metastatic colon cancer.   # Metastatic Right colon cancer, MSS, metastatic to b/l ovaries and peritoneum and liver, KRAS mutation G12V (+)  -Initially diagnosed in 08/2019, s/p resection with Dr Marcello Moores on 11/01/19. Path showed overall Stage IIIB cancer.  -s/p 6 months adjuvant FOLFOX, completed 06/01/20, oxaliplatin held for final 2 cycles due to neuropathy.  -surveillance colonoscopy on 02/10/21 under Dr. Carlean Purl showed only diverticulosis. Repeat due in 01/2024. -restaging CT CAP 08/26/21 was NED. -s/p BSO and hysterectomy on 12/22/21 with Dr. Berline Lopes. Path revealed metastatic moderately differentiated colonic adenocarcinoma involving both ovaries. Peritoneal deposits were also positive for metastatic mucinous adenocarcinoma, consistent with colorectal primary. -she restarted FOLFOX on 01/24/22. -Due to disease progression, chemotherapy changed to second line FOLFIRI and bevacizumab on 04/06/2022. -MR Liver from 04/21/2022 confirmed small lesion in the right hepatic lobe consistent with metastatic disease. CT chest was stable and negative for metastatic disease.  PLAN: -Due for  Cycle 3, Day 1 of FOLFIRI/Bev.  -Labs reviewed. WBC 18.7, elevated due to udenyca injection. Worsening anemia with Hgb 8.3, MCV 112.8. Creatinine and LFTs normal. CEA level has improved to 10.29.  -Ruled out vitamin B12 and folate deficiencies as underlying cause of macrocytic anemia. Discussed dose reduction with Dr. Burr Medico due  to worsening anemia and diarrhea. We decreased irinotecan from 150 mg/m2 to 130 mg/m2.  --Reviewed MR liver and CT chest from 04/21/2022 which confirmed metastatic hepatic lesion seen on CT abdomen/pelvis from 04/01/2022 --Proceed with treatment today and return to clinic on 05/16/2021 for labs, follow up visit with Dr. Burr Medico before Cycle 4.   #Diarrhea:  --Likely secondary to irinotecan --Currently on imodium with modest control. Sent prescription for Lomotil --Encouraged staying hydrated with electrolytes.   #Macrocytic anemia: --No evidence of B12 or folate deficiencies today. --Likely secondary to chemotherapy --Monitor closely for need for blood transfusion.   #Neuropathy: --Currently on Lyrica 25 mg twice daily --Stable without interference with grip or balance  Orders Placed This Encounter  Procedures   Vitamin B12    Standing Status:   Future    Number of Occurrences:   1    Standing Expiration Date:   05/03/2023   Folate, Serum    Standing Status:   Future    Number of Occurrences:   1    Standing Expiration Date:   05/03/2023   All questions were answered. The patient knows to call the clinic with any problems, questions or concerns. No barriers to learning was detected.  I have spent a total of 30 minutes minutes of face-to-face and non-face-to-face time, preparing to see the patient,  performing a medically appropriate examination, counseling and educating the patient, ordering medications/tests/procedures,documenting clinical information in the electronic health record, and care coordination.   Dede Query PA-C Dept of Hematology and [place] [facility] Phone: [phone]

## 2022-05-03 NOTE — Telephone Encounter (Signed)
Notified Patient of prior authorization approval for Lomotil 2.'5mg'$  Tablets. Medication is approved through 05/04/2023. No other needs or concerns voiced at this time.

## 2022-05-05 ENCOUNTER — Other Ambulatory Visit: Payer: Self-pay

## 2022-05-05 ENCOUNTER — Inpatient Hospital Stay: Payer: Medicare Other

## 2022-05-05 VITALS — BP 158/65 | HR 59 | Temp 98.5°F | Resp 18

## 2022-05-05 DIAGNOSIS — C787 Secondary malignant neoplasm of liver and intrahepatic bile duct: Secondary | ICD-10-CM | POA: Diagnosis not present

## 2022-05-05 DIAGNOSIS — Z85828 Personal history of other malignant neoplasm of skin: Secondary | ICD-10-CM | POA: Diagnosis not present

## 2022-05-05 DIAGNOSIS — C182 Malignant neoplasm of ascending colon: Secondary | ICD-10-CM

## 2022-05-05 DIAGNOSIS — Z853 Personal history of malignant neoplasm of breast: Secondary | ICD-10-CM | POA: Diagnosis not present

## 2022-05-05 DIAGNOSIS — Z5111 Encounter for antineoplastic chemotherapy: Secondary | ICD-10-CM | POA: Diagnosis not present

## 2022-05-05 DIAGNOSIS — Z923 Personal history of irradiation: Secondary | ICD-10-CM | POA: Diagnosis not present

## 2022-05-05 DIAGNOSIS — Z5189 Encounter for other specified aftercare: Secondary | ICD-10-CM | POA: Diagnosis not present

## 2022-05-05 DIAGNOSIS — E1140 Type 2 diabetes mellitus with diabetic neuropathy, unspecified: Secondary | ICD-10-CM | POA: Diagnosis not present

## 2022-05-05 DIAGNOSIS — R197 Diarrhea, unspecified: Secondary | ICD-10-CM | POA: Diagnosis not present

## 2022-05-05 DIAGNOSIS — D539 Nutritional anemia, unspecified: Secondary | ICD-10-CM | POA: Diagnosis not present

## 2022-05-05 DIAGNOSIS — C786 Secondary malignant neoplasm of retroperitoneum and peritoneum: Secondary | ICD-10-CM | POA: Diagnosis not present

## 2022-05-05 DIAGNOSIS — C7963 Secondary malignant neoplasm of bilateral ovaries: Secondary | ICD-10-CM | POA: Diagnosis not present

## 2022-05-05 DIAGNOSIS — I10 Essential (primary) hypertension: Secondary | ICD-10-CM | POA: Diagnosis not present

## 2022-05-05 MED ORDER — SODIUM CHLORIDE 0.9% FLUSH
10.0000 mL | INTRAVENOUS | Status: DC | PRN
  Administered 2022-05-05: 10 mL

## 2022-05-05 MED ORDER — PEGFILGRASTIM-CBQV 6 MG/0.6ML ~~LOC~~ SOSY
6.0000 mg | PREFILLED_SYRINGE | Freq: Once | SUBCUTANEOUS | Status: AC
  Administered 2022-05-05: 6 mg via SUBCUTANEOUS
  Filled 2022-05-05: qty 0.6

## 2022-05-05 MED ORDER — HEPARIN SOD (PORK) LOCK FLUSH 100 UNIT/ML IV SOLN
500.0000 [IU] | Freq: Once | INTRAVENOUS | Status: AC | PRN
  Administered 2022-05-05: 500 [IU]

## 2022-05-07 ENCOUNTER — Other Ambulatory Visit: Payer: Self-pay | Admitting: Physician Assistant

## 2022-05-12 ENCOUNTER — Encounter: Payer: Self-pay | Admitting: Family Medicine

## 2022-05-12 ENCOUNTER — Ambulatory Visit (INDEPENDENT_AMBULATORY_CARE_PROVIDER_SITE_OTHER): Payer: Medicare Other | Admitting: Family Medicine

## 2022-05-12 VITALS — BP 120/68 | HR 70 | Temp 97.0°F | Ht 65.0 in | Wt 233.0 lb

## 2022-05-12 DIAGNOSIS — E785 Hyperlipidemia, unspecified: Secondary | ICD-10-CM

## 2022-05-12 DIAGNOSIS — I10 Essential (primary) hypertension: Secondary | ICD-10-CM

## 2022-05-12 DIAGNOSIS — C182 Malignant neoplasm of ascending colon: Secondary | ICD-10-CM | POA: Diagnosis not present

## 2022-05-12 DIAGNOSIS — C786 Secondary malignant neoplasm of retroperitoneum and peritoneum: Secondary | ICD-10-CM

## 2022-05-12 DIAGNOSIS — E1169 Type 2 diabetes mellitus with other specified complication: Secondary | ICD-10-CM | POA: Diagnosis not present

## 2022-05-12 LAB — HEMOGLOBIN A1C: Hgb A1c MFr Bld: 7.2 % — ABNORMAL HIGH (ref 4.6–6.5)

## 2022-05-12 NOTE — Progress Notes (Signed)
[facility] [address] [place] [address] Dept: [phone] Dept Fax: [phone]  Chronic Care Office Visit  Subjective:    Patient ID: Kim Castro, female    DOB: 03/06/1946, 77 y.o..   MRN: [id number]  Chief Complaint  Patient presents with   Follow-up    3 month f/u.  No concerns.     History of Present Illness:  Patient is in today for reassessment of chronic medical issues.  Ms. Coyne has a history of type 2 diabetes. She is managed on metformin 500 mg bid.    Ms. Talbot has a history of hypertension. She is managed on losartan 25 mg daily.   Ms. Elizondo has a history of hyperlipidemia. She is currently on pravastatin 10 mg daily.   Ms. Chamberlin has a history of both breast and colon cancer. Earlier in 2023, Ms. Braniff was found to have metastatic colon cancer throughout the peritoneal cavity, esp. involving both ovaries. She underwent a total hysterectomy. She currently has a Port-a-cath in place and is receiving chemotherapy. She is receiving this every other week. She admits to issues with some nausea, but feels overall she is managing with this. She does have some fatigue associated with her treatments and will sleep for longer periods at times. She has had a change in her taste, so some foods do not taste as good. She also notes she has a low appetite much of the time. She does have a husband at home. he has some significant heart issues. She notes that he is not a great support for her.  Past Medical History: Patient Active Problem List   Diagnosis Date Noted   Metastasis to peritoneal cavity ([place]) 01/12/2022   Nausea without vomiting 12/23/2021   Thickened endometrium    Essential hypertension 05/12/2021   Vitamin D deficiency 05/12/2021   Personal history of malignant neoplasm of breast 05/12/2021   Personal history of colonic polyps 05/12/2021   Morbid obesity ([place]) 05/12/2021   Hyperlipidemia  05/12/2021   Hypercalcemia 05/12/2021   Allergic rhinitis 05/12/2021   Knee joint replacement status, bilateral 05/12/2021   Peripheral neuropathy due to chemotherapy ([place]) 05/12/2021   Aortic atherosclerosis ([place]) 05/12/2021   Gastroesophageal reflux disease    Type 2 diabetes mellitus with hyperlipidemia ([place])    History of pulmonary embolism 01/27/2020   Port-A-Cath in place 12/30/2019   Malignant neoplasm of ascending colon ([place]) 11/01/2019   Genetic testing 07/08/2014   Malignant neoplasm of female breast ([place]) 06/04/2014   Past Surgical History:  Procedure Laterality Date   BREAST BIOPSY Bilateral    BREAST LUMPECTOMY Left    BREAST LUMPECTOMY WITH RADIOACTIVE SEED LOCALIZATION Left 07/01/2014   Procedure: LEFT BREAST LUMPECTOMY WITH RADIOACTIVE SEED LOCALIZATION;  Surgeon: Excell Seltzer, MD;  Location: [facility];  Service: General;  Laterality: Left;   CATARACT EXTRACTION Right    COLONOSCOPY  09/24/2019   [facility]   LAPAROSCOPIC PARTIAL COLECTOMY N/A 11/01/2019   Procedure: LAPAROSCOPIC PARTIAL COLECTOMY;  Surgeon: Leighton Ruff, MD;  Location: [facility];  Service: General;  Laterality: N/A;   POLYPECTOMY     PORTACATH PLACEMENT N/A 12/10/2019   Procedure: INSERTION PORT-A-CATH ULTRASOUND GUIDED IN RIGHT IJ;  Surgeon: Leighton Ruff, MD;  Location: [facility];  Service: General;  Laterality: N/A;   ROBOTIC ASSISTED TOTAL HYSTERECTOMY Bilateral 12/22/2021   Procedure: XI ROBOTIC ASSISTED TOTAL HYSTERECTOMY WITH BILATERAL SALPINGO OOPHORECTOMY;  Surgeon: [name], MD;  Location: [facility];  Service: Gynecology;  Laterality: Bilateral;   TOTAL KNEE ARTHROPLASTY Left 10/24/2012   Procedure: TOTAL KNEE ARTHROPLASTY;  Surgeon: Kerin Salen, MD;  Location: [facility];  Service: Orthopedics;  Laterality: Left;   TOTAL KNEE ARTHROPLASTY Right 01/09/2013   Procedure: TOTAL KNEE ARTHROPLASTY;  Surgeon: Kerin Salen, MD;  Location: [facility];  Service: Orthopedics;   Laterality: Right;   TUBAL LIGATION     Family History  Problem Relation Age of Onset   Diabetes Mother    Uterine cancer Mother        deceased 49   Hypertension Father    Heart disease Father    Diabetes Sister    Breast cancer Sister 55       currently 68   Hypertension Sister    Diabetes Sister    Breast cancer Sister    Diabetes Sister    Kidney disease Sister    Heart disease Sister    Lupus Sister    Heart disease Sister    Diabetes Sister    Diabetes Sister    Hypertension Brother    Diabetes Brother    Colon cancer Brother 64   Colon polyps Daughter    Thyroid cancer Daughter 91       currently 56; type?   Cancer Maternal Uncle        unk. primary; deceased 50s   Stroke Maternal Grandmother    Esophageal cancer Neg Hx    Rectal cancer Neg Hx    Stomach cancer Neg Hx    Outpatient Medications Prior to Visit  Medication Sig Dispense Refill   acetaminophen (TYLENOL) 325 MG tablet Take 650 mg by mouth every 6 (six) hours as needed for moderate pain.     b complex vitamins capsule Take 1 capsule by mouth daily.     Cholecalciferol (VITAMIN D) 2000 UNITS CAPS Take 2,000 Units by mouth daily.      diphenoxylate-atropine (LOMOTIL) 2.5-0.025 MG tablet Take 1 tablet by mouth 4 (four) times daily as needed for diarrhea or loose stools. 120 tablet 0   Ibuprofen 200 MG CAPS Take by mouth.     loratadine (CLARITIN) 10 MG tablet Take 10 mg by mouth daily as needed for allergies.     losartan (COZAAR) 25 MG tablet Take 1 tablet (25 mg total) by mouth daily. 90 tablet 3   metFORMIN (GLUCOPHAGE) 500 MG tablet Take 1 tablet (500 mg total) by mouth in the morning and at bedtime. 180 tablet 3   Multiple Vitamins-Minerals (MULTIVITAMIN WITH MINERALS) tablet Take 1 tablet by mouth daily.     omeprazole (PRILOSEC) 20 MG capsule Take 20 mg by mouth daily before breakfast.      potassium chloride (KLOR-CON M) 10 MEQ tablet Take 1 tablet (10 mEq total) by mouth daily. 30 tablet 1    pravastatin (PRAVACHOL) 10 MG tablet Take 1 tablet (10 mg total) by mouth daily. 90 tablet 3   pregabalin (LYRICA) 25 MG capsule Take 1 capsule (25 mg total) by mouth 2 (two) times daily. 60 capsule 0   No facility-administered medications prior to visit.   Allergies  Allergen Reactions   Oxycodone Nausea And Vomiting     Objective:   Today's Vitals   05/12/22 0816  BP: 120/68  Pulse: 70  Temp: (!) 97 F (36.1 C)  TempSrc: Temporal  SpO2: 96%  Weight: 233 lb (105.7 kg)  Height: '5\' 5"'$  (1.651 m)   Body mass index is 38.77 kg/m.   General:  Well developed, well nourished. No acute distress. Psych: Alert and oriented. Normal mood and affect.  Health Maintenance Due  Topic Date Due   Diabetic kidney evaluation - Urine ACR  05/12/2022     Assessment & Plan:   1. Type 2 diabetes mellitus with hyperlipidemia ([place]) Diabetes has been in good control. We will check Ms. Steenson's A1c today. Plan to continue metformin 500 mg bid. Also due for annual micral.  - Hemoglobin A1c - Microalbumin / creatinine urine ratio  2. Essential hypertension Blood pressure is at goal. Continue losartan 25 mg daily.   3. Malignant neoplasm of ascending colon ([place]) 4. Metastasis to peritoneal cavity [facility]) Reviewed oncology notes and labs. Last CEA now down to 10 from near 40, indicating response to her current therapy. Her weight is down ~ 10 lbs in last month. I encouraged her to eat when she can. She will continue to work with Dr. Annamaria Boots (oncology).  Return in about 3 months (around 08/11/2022) for Reassessment.   [name], MD

## 2022-05-13 ENCOUNTER — Other Ambulatory Visit: Payer: Self-pay

## 2022-05-13 LAB — MICROALBUMIN / CREATININE URINE RATIO
Creatinine,U: 65.8 mg/dL
Microalb Creat Ratio: 1.4 mg/g (ref 0.0–30.0)
Microalb, Ur: 0.9 mg/dL (ref 0.0–1.9)

## 2022-05-13 MED FILL — Dexamethasone Sodium Phosphate Inj 100 MG/10ML: INTRAMUSCULAR | Qty: 1 | Status: AC

## 2022-05-15 NOTE — Assessment & Plan Note (Signed)
-  Dx in 05/2014, s/p left lumpectomy with Dr Excell Seltzer, adjuvant RT with Dr Pablo Ledger, and Anastrozole 08/2014 - 01/13/20 under Dr Lindi Adie.  -DEXA 09/11/20 T score +1.2 normal.  -most recent mammogram on 10/04/21 was negative. Will continue yearly

## 2022-05-15 NOTE — Assessment & Plan Note (Signed)
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 Path showed overall Stage IIIB cancer.  -s/p 6 months adjuvant FOLFOX, completed 06/01/20, oxaliplatin held for final 2 cycles due to neuropathy.  -s/p BSO and hysterectomy on 12/22/21 with Dr. Berline Lopes. Path revealed metastatic moderately differentiated colonic adenocarcinoma involving both ovaries and peritoneum -she restarted FOLFOX on 01/24/22. -Due to disease progression, chemotherapy changed to second line FOLFIRI and bevacizumab on April 06, 2022.

## 2022-05-15 NOTE — Assessment & Plan Note (Signed)
-  Secondary to oxaliplatin -She is on Lyrica and B complex, continue to monitor.  Overall stable.

## 2022-05-16 ENCOUNTER — Inpatient Hospital Stay: Payer: Medicare Other

## 2022-05-16 ENCOUNTER — Encounter: Payer: Self-pay | Admitting: Hematology

## 2022-05-16 ENCOUNTER — Other Ambulatory Visit: Payer: Self-pay

## 2022-05-16 ENCOUNTER — Other Ambulatory Visit: Payer: Self-pay | Admitting: *Deleted

## 2022-05-16 ENCOUNTER — Other Ambulatory Visit (HOSPITAL_COMMUNITY): Payer: Self-pay

## 2022-05-16 ENCOUNTER — Inpatient Hospital Stay (HOSPITAL_BASED_OUTPATIENT_CLINIC_OR_DEPARTMENT_OTHER): Payer: Medicare Other | Admitting: Hematology

## 2022-05-16 VITALS — BP 129/51 | HR 53 | Resp 17

## 2022-05-16 VITALS — BP 132/54 | HR 62 | Temp 98.9°F | Resp 16 | Ht 65.0 in | Wt 225.7 lb

## 2022-05-16 DIAGNOSIS — Z85828 Personal history of other malignant neoplasm of skin: Secondary | ICD-10-CM | POA: Diagnosis not present

## 2022-05-16 DIAGNOSIS — C7963 Secondary malignant neoplasm of bilateral ovaries: Secondary | ICD-10-CM | POA: Diagnosis not present

## 2022-05-16 DIAGNOSIS — Z95828 Presence of other vascular implants and grafts: Secondary | ICD-10-CM

## 2022-05-16 DIAGNOSIS — C786 Secondary malignant neoplasm of retroperitoneum and peritoneum: Secondary | ICD-10-CM

## 2022-05-16 DIAGNOSIS — C182 Malignant neoplasm of ascending colon: Secondary | ICD-10-CM | POA: Diagnosis not present

## 2022-05-16 DIAGNOSIS — I10 Essential (primary) hypertension: Secondary | ICD-10-CM | POA: Diagnosis not present

## 2022-05-16 DIAGNOSIS — Z853 Personal history of malignant neoplasm of breast: Secondary | ICD-10-CM

## 2022-05-16 DIAGNOSIS — G620 Drug-induced polyneuropathy: Secondary | ICD-10-CM | POA: Diagnosis not present

## 2022-05-16 DIAGNOSIS — Z800 Family history of malignant neoplasm of digestive organs: Secondary | ICD-10-CM

## 2022-05-16 DIAGNOSIS — Z8049 Family history of malignant neoplasm of other genital organs: Secondary | ICD-10-CM

## 2022-05-16 DIAGNOSIS — R197 Diarrhea, unspecified: Secondary | ICD-10-CM | POA: Diagnosis not present

## 2022-05-16 DIAGNOSIS — Z923 Personal history of irradiation: Secondary | ICD-10-CM | POA: Diagnosis not present

## 2022-05-16 DIAGNOSIS — Z5111 Encounter for antineoplastic chemotherapy: Secondary | ICD-10-CM | POA: Diagnosis not present

## 2022-05-16 DIAGNOSIS — Z803 Family history of malignant neoplasm of breast: Secondary | ICD-10-CM

## 2022-05-16 DIAGNOSIS — D539 Nutritional anemia, unspecified: Secondary | ICD-10-CM | POA: Diagnosis not present

## 2022-05-16 DIAGNOSIS — C787 Secondary malignant neoplasm of liver and intrahepatic bile duct: Secondary | ICD-10-CM | POA: Diagnosis not present

## 2022-05-16 DIAGNOSIS — Z170 Estrogen receptor positive status [ER+]: Secondary | ICD-10-CM

## 2022-05-16 DIAGNOSIS — Z5189 Encounter for other specified aftercare: Secondary | ICD-10-CM | POA: Diagnosis not present

## 2022-05-16 DIAGNOSIS — R200 Anesthesia of skin: Secondary | ICD-10-CM

## 2022-05-16 DIAGNOSIS — T451X5A Adverse effect of antineoplastic and immunosuppressive drugs, initial encounter: Secondary | ICD-10-CM

## 2022-05-16 DIAGNOSIS — E1140 Type 2 diabetes mellitus with diabetic neuropathy, unspecified: Secondary | ICD-10-CM | POA: Diagnosis not present

## 2022-05-16 LAB — CBC WITH DIFFERENTIAL (CANCER CENTER ONLY)
Abs Immature Granulocytes: 0.48 10*3/uL — ABNORMAL HIGH (ref 0.00–0.07)
Basophils Absolute: 0.1 10*3/uL (ref 0.0–0.1)
Basophils Relative: 1 %
Eosinophils Absolute: 0.1 10*3/uL (ref 0.0–0.5)
Eosinophils Relative: 1 %
HCT: 28.8 % — ABNORMAL LOW (ref 36.0–46.0)
Hemoglobin: 9.3 g/dL — ABNORMAL LOW (ref 12.0–15.0)
Immature Granulocytes: 5 %
Lymphocytes Relative: 31 %
Lymphs Abs: 3 10*3/uL (ref 0.7–4.0)
MCH: 36 pg — ABNORMAL HIGH (ref 26.0–34.0)
MCHC: 32.3 g/dL (ref 30.0–36.0)
MCV: 111.6 fL — ABNORMAL HIGH (ref 80.0–100.0)
Monocytes Absolute: 1 10*3/uL (ref 0.1–1.0)
Monocytes Relative: 10 %
Neutro Abs: 5.2 10*3/uL (ref 1.7–7.7)
Neutrophils Relative %: 52 %
Platelet Count: 163 10*3/uL (ref 150–400)
RBC: 2.58 MIL/uL — ABNORMAL LOW (ref 3.87–5.11)
RDW: 19.1 % — ABNORMAL HIGH (ref 11.5–15.5)
WBC Count: 9.8 10*3/uL (ref 4.0–10.5)
nRBC: 0.8 % — ABNORMAL HIGH (ref 0.0–0.2)

## 2022-05-16 LAB — CMP (CANCER CENTER ONLY)
ALT: 13 U/L (ref 0–44)
AST: 20 U/L (ref 15–41)
Albumin: 3.6 g/dL (ref 3.5–5.0)
Alkaline Phosphatase: 118 U/L (ref 38–126)
Anion gap: 7 (ref 5–15)
BUN: 16 mg/dL (ref 8–23)
CO2: 26 mmol/L (ref 22–32)
Calcium: 9.2 mg/dL (ref 8.9–10.3)
Chloride: 110 mmol/L (ref 98–111)
Creatinine: 0.95 mg/dL (ref 0.44–1.00)
GFR, Estimated: >60 mL/min (ref >60)
Glucose, Bld: 119 mg/dL — ABNORMAL HIGH (ref 70–99)
Potassium: 4.1 mmol/L (ref 3.5–5.1)
Sodium: 143 mmol/L (ref 135–145)
Total Bilirubin: 1.3 mg/dL — ABNORMAL HIGH (ref 0.3–1.2)
Total Protein: 6.2 g/dL — ABNORMAL LOW (ref 6.5–8.1)

## 2022-05-16 LAB — TOTAL PROTEIN, URINE DIPSTICK: Protein, ur: NEGATIVE mg/dL

## 2022-05-16 MED ORDER — SODIUM CHLORIDE 0.9% FLUSH
10.0000 mL | INTRAVENOUS | Status: DC | PRN
  Administered 2022-05-16: 10 mL

## 2022-05-16 MED ORDER — PREGABALIN 25 MG PO CAPS: 25.0000 mg | ORAL_CAPSULE | Freq: Two times a day (BID) | ORAL | 3 refills | Status: DC

## 2022-05-16 MED ORDER — PALONOSETRON HCL INJECTION 0.25 MG/5ML
0.2500 mg | Freq: Once | INTRAVENOUS | Status: AC
  Administered 2022-05-16: 0.25 mg via INTRAVENOUS
  Filled 2022-05-16: qty 5

## 2022-05-16 MED ORDER — ALTEPLASE 2 MG IJ SOLR
2.0000 mg | Freq: Once | INTRAMUSCULAR | Status: AC | PRN
  Administered 2022-05-16: 2 mg
  Filled 2022-05-16: qty 2

## 2022-05-16 MED ORDER — SODIUM CHLORIDE 0.9 % IV SOLN
10.0000 mg | Freq: Once | INTRAVENOUS | Status: AC
  Administered 2022-05-16: 10 mg via INTRAVENOUS
  Filled 2022-05-16: qty 10

## 2022-05-16 MED ORDER — SODIUM CHLORIDE 0.9 % IV SOLN
2240.0000 mg/m2 | INTRAVENOUS | Status: DC
  Administered 2022-05-16: 5000 mg via INTRAVENOUS
  Filled 2022-05-16: qty 100

## 2022-05-16 MED ORDER — DIPHENOXYLATE-ATROPINE 2.5-0.025 MG PO TABS
1.0000 | ORAL_TABLET | Freq: Four times a day (QID) | ORAL | 0 refills | Status: DC | PRN
  Filled 2022-05-16: qty 60, 15d supply, fill #0

## 2022-05-16 MED ORDER — SODIUM CHLORIDE 0.9 % IV SOLN
400.0000 mg/m2 | Freq: Once | INTRAVENOUS | Status: AC
  Administered 2022-05-16: 896 mg via INTRAVENOUS
  Filled 2022-05-16: qty 44.8

## 2022-05-16 MED ORDER — SODIUM CHLORIDE 0.9 % IV SOLN: Freq: Once | INTRAVENOUS | Status: AC

## 2022-05-16 MED ORDER — SODIUM CHLORIDE 0.9 % IV SOLN
100.0000 mg/m2 | Freq: Once | INTRAVENOUS | Status: AC
  Administered 2022-05-16: 220 mg via INTRAVENOUS
  Filled 2022-05-16: qty 11

## 2022-05-16 MED ORDER — SODIUM CHLORIDE 0.9 % IV SOLN
5.0000 mg/kg | Freq: Once | INTRAVENOUS | Status: AC
  Administered 2022-05-16: 500 mg via INTRAVENOUS
  Filled 2022-05-16: qty 16

## 2022-05-16 MED ORDER — ATROPINE SULFATE 1 MG/ML IV SOLN
0.5000 mg | Freq: Once | INTRAVENOUS | Status: AC | PRN
  Administered 2022-05-16: 0.5 mg via INTRAVENOUS
  Filled 2022-05-16: qty 1

## 2022-05-16 NOTE — Patient Instructions (Signed)
[facility]  Discharge Instructions: Thank you for choosing [facility] to provide your oncology and hematology care.   If you have a lab appointment with the [facility], please go directly to the [facility] and check in at the registration area.   Wear comfortable clothing and clothing appropriate for easy access to any Portacath or PICC line.   We strive to give you quality time with your provider. You may need to reschedule your appointment if you arrive late (15 or more minutes).  Arriving late affects you and other patients whose appointments are after yours.  Also, if you miss three or more appointments without notifying the office, you may be dismissed from the clinic at the provider's discretion.      For prescription refill requests, have your pharmacy contact our office and allow 72 hours for refills to be completed.    Today you received the following chemotherapy and/or immunotherapy agents : Bevacizumab, Irinotecan, Leucovorin, 5FU      To help prevent nausea and vomiting after your treatment, we encourage you to take your nausea medication as directed.  BELOW ARE SYMPTOMS THAT SHOULD BE REPORTED IMMEDIATELY: *FEVER GREATER THAN 100.4 F (38 C) OR HIGHER *CHILLS OR SWEATING *NAUSEA AND VOMITING THAT IS NOT CONTROLLED WITH YOUR NAUSEA MEDICATION *UNUSUAL SHORTNESS OF BREATH *UNUSUAL BRUISING OR BLEEDING *URINARY PROBLEMS (pain or burning when urinating, or frequent urination) *BOWEL PROBLEMS (unusual diarrhea, constipation, pain near the anus) TENDERNESS IN MOUTH AND THROAT WITH OR WITHOUT PRESENCE OF ULCERS (sore throat, sores in mouth, or a toothache) UNUSUAL RASH, SWELLING OR PAIN  UNUSUAL VAGINAL DISCHARGE OR ITCHING   Items with * indicate a potential emergency and should be followed up as soon as possible or go to the Emergency Department if any problems should occur.  Please show the CHEMOTHERAPY ALERT CARD or  IMMUNOTHERAPY ALERT CARD at check-in to the Emergency Department and triage nurse.  Should you have questions after your visit or need to cancel or reschedule your appointment, please contact [facility]  Dept: [phone]  and follow the prompts.  Office hours are 8:00 a.m. to 4:30 p.m. Monday - Friday. Please note that voicemails left after 4:00 p.m. may not be returned until the following business day.  We are closed weekends and major holidays. You have access to a nurse at all times for urgent questions. Please call the main number to the clinic Dept: [phone] and follow the prompts.   For any non-urgent questions, you may also contact your provider using MyChart. We now offer e-Visits for anyone 77 and older to request care online for non-urgent symptoms. For details visit [email or web address].   Also download the MyChart app! Go to the app store, search "MyChart", open the app, select [facility], and log in with your MyChart username and password.

## 2022-05-16 NOTE — Progress Notes (Signed)
[place]   [phone] [phone]   Clinic Follow up Note   Patient Care Team: [name], MD as PCP - General (Family Medicine) Rolm Bookbinder, MD as Consulting Physician (General Surgery) Truitt Merle, MD as Consulting Physician (Hematology) Leighton Ruff, MD as Consulting Physician (General Surgery) Nicholas Lose, MD as Consulting Physician (Hematology and Oncology)  Date of Service:  05/16/2022  CHIEF COMPLAINT: f/u of right colon cancer and h/o DCIS   CURRENT THERAPY:  Second line FOLFIRI/beva started on 04/04/22    ASSESSMENT:  Kim Castro is a 77 y.o. female with   Malignant neoplasm of ascending colon ([place]) pT3N2aM0 stage IIB, MSS, metastatic to b/l ovaries and peritoneum, KRAS mutation G12V (+)  -Initially diagnosed in 08/2019, s/p resection with Dr Marcello Moores on 11/01/19. Path showed overall Stage IIIB cancer.  -s/p 6 months adjuvant FOLFOX, completed 06/01/20, oxaliplatin held for final 2 cycles due to neuropathy.  -s/p BSO and hysterectomy on 12/22/21 with Dr. Berline Lopes. Path revealed metastatic moderately differentiated colonic adenocarcinoma involving both ovaries and peritoneum -she restarted FOLFOX on 01/24/22. -Due to disease progression, chemotherapy changed to second line FOLFIRI and bevacizumab on April 06, 2022.  Malignant neoplasm of female breast [facility]) -Dx in 05/2014, s/p left lumpectomy with Dr Excell Seltzer, adjuvant RT with Dr Pablo Ledger, and Anastrozole 08/2014 - 01/13/20 under Dr Lindi Adie.  -DEXA 09/11/20 T score +1.2 normal.  -most recent mammogram on 10/04/21 was negative. Will continue yearly   Peripheral neuropathy due to chemotherapy ([place]) -Secondary to oxaliplatin -She is on Lyrica and B complex, continue to monitor.  Overall stable.    PLAN: -We discussed management of diarrhea, my nurse will call pharmacy to help her lomotil filled  -Recommend liquid IV to drink. - proceed with C4 FOLFIRI/Beva at further reduce  irinotecan dose due to diarrhea and weight loss  - Give 1 hr of Hydration today  -lab,f/u and Folfiri + beva  05/30/2022    SUMMARY OF ONCOLOGIC HISTORY: Oncology History Overview Note   Cancer Staging  Malignant neoplasm of female breast [facility]) Staging form: Breast, AJCC 7th Edition - Clinical stage from 06/11/2014: Stage 0 (Tis (DCIS), N0, M0) - Unsigned Staged by: Pathologist and managing physician Laterality: Left Estrogen receptor status: Positive Progesterone receptor status: Positive Stage used in treatment planning: Yes National guidelines used in treatment planning: Yes Type of national guideline used in treatment planning: NCCN - Pathologic stage from 07/03/2014: Stage Unknown (Tis (DCIS), NX, cM0) - Signed by Enid Cutter, MD on 07/10/2014 Staged by: Pathologist Laterality: Left Estrogen receptor status: Positive Progesterone receptor status: Positive Stage used in treatment planning: Yes National guidelines used in treatment planning: Yes Type of national guideline used in treatment planning: NCCN Staging comments: Staged on final lumpectomy specimen by Dr. Donato Heinz.  Right colon cancer Staging form: Colon and Rectum, AJCC 8th Edition - Pathologic stage from 11/01/2019: Stage IIIB (pT3, pN2a, cM0) - Signed by Truitt Merle, MD on 11/29/2019 Stage prefix: Initial diagnosis Histologic grading system: 4 grade system Histologic grade (G): G2 Residual tumor (R): R0 - None Tumor deposits (TD): Absent Perineural invasion (PNI): Absent Microsatellite instability (MSI): Stable KRAS mutation: Unknown NRAS mutation: Unknown BRAF mutation: Unknown     Malignant neoplasm of female breast ([place])  05/29/2014 Initial Biopsy   Left breast needle core biopsy: Grade 2, DCIS with calcs. ER+ (100%), PR+ (96%).    06/04/2014 Initial Diagnosis   Left breast DCIS with calcifications, ER 100%, PR 96%   06/10/2014 Breast MRI  Left breast: 2.4 x 1.3 x 1.1 cm area of patchy non-mass enhancement upper  outer quadrant includes postbiopsy seroma; Right breast: 1.2 cm previously biopsied stable benign fibroadenoma   06/12/2014 Procedure   Genetic counseling/testing: Identified 1 VUS on CHEK2 gene. Remainder of 17 gene panel tested negative and included: ATM, BARD1, BRCA 1/2, BRIP1, CDH1, CHEK2, EPCAM, MLH1, MSH2, MSH6, NBN, NF1, PALB2, PTEN, RAD50, RAD51C, RAD51D, STK11, and TP53.    07/01/2014 Surgery   Left breast lumpectomy ([place]): Grade 1, DCIS, spanning 2.3 cm, 1 mm margin, ER 100%, PR 96%   07/31/2014 - 08/28/2014 Radiation Therapy   Adjuvant RT completed Pablo Ledger). Left breast: Total dose 42.5 Gy over 17 fractions. Left breast boost: Total dose 7.5 Gy over 3 fractions.    09/14/2014 - 01/13/2020 Anti-estrogen oral therapy   Anastrazole '1mg'$  daily. Planned duration of treatment: 5 years [place]). Completed in 01/2020.    09/25/2014 Survivorship   Survivorship Care Plan given to patient and reviewed with her in person.    03/02/2021 Imaging   CT CAP  IMPRESSION: 1. No findings of active/recurrent malignancy. Partial right hemicolectomy. 2. Endometrial stripe remains mildly thickened, but endometrial biopsy in May was negative for malignancy. 3. Progressive endplate sclerosis and endplate irregularity at T2-3, probably due to degenerative endplate findings. If the has referable upper thoracic pain/symptoms then thoracic spine MRI could be used for further workup. 4. Other imaging findings of potential clinical significance: Mild cardiomegaly. Aortic Atherosclerosis (ICD10-I70.0). Mild mitral valve calcification. Postoperative findings in the left breast with adjacent radiation port anteriorly in the left upper lobe. Tiny pulmonary nodules in the left lower lobe are unchanged from earliest available comparison of 10/17/2019 and probably benign although may merit surveillance. Multilevel lumbar impingement. Mild pelvic floor laxity.   Malignant neoplasm of ascending colon ([place])   09/19/2019 Imaging   CT AP W contrast 09/19/19  IMPRESSION Fullness in the cecum, cannot exclude a mass. No evidence for metastatic disease is identified.    09/24/2019 Procedure   Colonoscopy by Dr Eber Jones 09/24/19 IMPRESSION 1. The colon was redundant  2. Mild diverticulosis was noted through the entire examined colon 3. Single 84m polyp was found in the ascending colon; polypectomy was performed using snare cautery and biopsy forceps 4. Mild diverticulosis was notes in the descending colon and sigmoid colon.  5. Single polyp was found in the sigmoid colon, polypectomy was performed with cold forceps.  6. Single polyp was found in the rectosigmoid colon; polypectomy was performed with cold snare  7. Small internal hemorrhoids  8. Large mass was found at the cecum; multiple biopsies of the area were performed using cold forceps; injection (tattooing) was performed distal to the mass.    09/24/2019 Initial Biopsy   INTERPRETATION AND DIAGNOSIS:  A. Cecum, biopsy:  Invasive moderately differentiated adenocarcinoma.  see comment  B. Polyp @ ascending colon, polypectomy:  Tubular Adenoma  C. Polyp @ sigmoid colon Polypectomy:  hyperplastic polyp.  D. Polyp @ rectosigmoid colon, Polypectomy:  Hyperplastic Polyp      10/16/2019 Imaging   CT Chest IMPRESSION: 1. Multiple small pulmonary nodules measuring 5 mm or less in size in the lungs. These are nonspecific and are typically considered statistically likely benign. However, given the patient's history of primary malignancy, close attention on follow-up studies is recommended to ensure stability. 2. Aortic atherosclerosis, in addition to right coronary artery disease. Assessment for potential risk factor modification, dietary therapy or pharmacologic therapy may be warranted, if clinically indicated. 3. There are  calcifications of the aortic valve and mitral annulus. Echocardiographic correlation for evaluation of potential  valvular dysfunction may be warranted if clinically indicated. 4. Small hiatal hernia.   Aortic Atherosclerosis (ICD10-I70.0).   11/01/2019 Initial Diagnosis   Colon cancer ([place])   11/01/2019 Surgery   LAPAROSCOPIC PARTIAL COLECTOMY by Dr Marcello Moores and Dr Johney Maine   11/01/2019 Pathology Results   FINAL MICROSCOPIC DIAGNOSIS:   A. COLON, PROXIMAL RIGHT, COLECTOMY:  - Invasive colonic adenocarcinoma, 5 cm.  - Tumor invades through the muscularis propria into pericolonic tissues.   - Margins of resection are not involved.  - Metastatic carcinoma in (5) of (13) lymph nodes.  - See oncology table.    MSI Stable  Mismatch repair normal  MLH1 - Preserved nuclear expression (greater 50% tumor expression) MSH2 - Preserved nuclear expression (greater 50% tumor expression) MSH6 - Preserved nuclear expression (greater 50% tumor expression) PMS2 - Preserved nuclear expression (greater 50% tumor expression)   11/01/2019 Cancer Staging   Staging form: Colon and Rectum, AJCC 8th Edition - Pathologic stage from 11/01/2019: Stage IIIB (pT3, pN2a, cM0) - Signed by Truitt Merle, MD on 11/29/2019   12/10/2019 Procedure   PAC placed 12/10/19   12/17/2019 - 06/01/2020 Chemotherapy   FOLFOX q2weeks starting in 2 weeks starting 12/17/19. Held 01/27/20-02/10/20 due to b/l PE. Oxaliplatin held C11-12 due to neuropathy. Completed on 06/01/20   03/02/2021 Imaging   CT CAP  IMPRESSION: 1. No findings of active/recurrent malignancy. Partial right hemicolectomy. 2. Endometrial stripe remains mildly thickened, but endometrial biopsy in May was negative for malignancy. 3. Progressive endplate sclerosis and endplate irregularity at T2-3, probably due to degenerative endplate findings. If the has referable upper thoracic pain/symptoms then thoracic spine MRI could be used for further workup. 4. Other imaging findings of potential clinical significance: Mild cardiomegaly. Aortic Atherosclerosis (ICD10-I70.0). Mild  mitral valve calcification. Postoperative findings in the left breast with adjacent radiation port anteriorly in the left upper lobe. Tiny pulmonary nodules in the left lower lobe are unchanged from earliest available comparison of 10/17/2019 and probably benign although may merit surveillance. Multilevel lumbar impingement. Mild pelvic floor laxity.   08/26/2021 Imaging   EXAM: CT CHEST, ABDOMEN, AND PELVIS WITH CONTRAST  IMPRESSION: 1. Stable examination without new or progressive findings to suggest local recurrence or metastatic disease within the chest, abdomen, or pelvis. 2. Hepatomegaly with hepatic steatosis. 3. Sigmoid colonic diverticulosis without findings of acute diverticulitis. 4. Similar prominent endplate sclerosis and irregularity at T2-T3 is most consistent with Modic type endplate degenerative changes. However, if patient has referable upper thoracic pain consider further workup with thoracic spine MRI. 5. Similar thickening of the endometrial stripe measuring up to 8 mm, which was previously biopsied with results negative for malignancy. 6. Aortic Atherosclerosis (ICD10-I70.0).   11/10/2021 PET scan   IMPRESSION: 1. LEFT ovary is increased in size and is intensely hypermetabolic. While physiologic hypermetabolic ovarian tissue is not uncommon, the enlargement and asymmetric activity warrants further evaluation. Consider contrast pelvic MRI vs tissue sampling. 2. No evidence of metastatic colorectal carcinoma otherwise. 3. Post RIGHT hemicolectomy anatomy. 4. Evidence of radiation change in the LEFT upper lobe (remote breast cancer).     12/22/2021 Relapse/Recurrence    FINAL MICROSCOPIC DIAGNOSIS:   A. LEFT OVARY AND FALLOPIAN TUBE, SALPINGO OOPHORECTOMY:  - Metastatic moderately differentiated colonic adenocarcinoma involving left ovary  - Focal ovarian stromal calcification  - Segment of benign fallopian tube   B. UTERUS WITH RIGHT FALLOPIAN TUBE AND OVARY,  HYSTERECTOMY AND  SALPINGO-OOPHORECTOMY:  - Metastatic moderately differentiated colonic adenocarcinoma involving right ovary  - Focal invasive extensive adenomyosis  - Benign endometrial polyps  - Benign proliferative phase endometrium  - Hydrosalpinx of right fallopian tube  - Focal ovarian stromal calcification   C. PERITONEAL DEPOSITS, ANTERIOR CUL DE SAC, BIOPSY:  - Metastatic mucinous adenocarcinoma, consistent with colorectal primary    COMMENT:  Immunohistochemical stains show that the tumor cells are positive for CK20 and CDX2 while they are negative for CK7 and PAX8, consistent with above interpretation.    01/24/2022 - 03/23/2022 Chemotherapy   Patient is on Treatment Plan : COLORECTAL FOLFOX q14d x 3 months     04/01/2022 Imaging   CT AP IMPRESSION: 1. New omental metastatic disease. 2. Vague hypoattenuating lesion in the inferior right hepatic lobe is new and also worrisome for metastatic disease. 3. Similar small right lower lobe nodules. Recommend continued attention on follow-up. 4. Steatotic enlarged liver. 5.  Aortic atherosclerosis (ICD10-I70.0).   04/06/2022 -  Chemotherapy   Patient is on Treatment Plan : COLORECTAL FOLFIRI + Bevacizumab q14d        INTERVAL HISTORY:  Kim Castro is here for a follow up of right colon cancer and h/o DCIS  She was last seen by PA-C Dede Query on 05/03/2022 She presents to the clinic alone. Pt states she had a a lot of diarrhea over the weekend. Pt states she takes the max of imodium. Pt said she is not drinking enough water. Her activity is limited   she does some cleaning and cooking for herself. Pt states that salty  foods make her nauseous. Pt had some dizziness over the weekend. Pt had a little ulcer on her tongue, cleared up bu oral gel. Neuropathy is getting worse per pt.     All other systems were reviewed with the patient and are negative.  MEDICAL HISTORY:  Past Medical History:  Diagnosis Date   Aortic  atherosclerosis (HCC)    Arthritis    feet, lower back   Basal cell carcinoma    arm   Breast cancer of upper-outer quadrant of left female breast ([place]) 06/04/2014   Cataract    immature on the left   Colon cancer ([place]) 08/2019   Diabetes mellitus without complication (HCC)    Diverticulosis    Dizziness    > 97yr ago;took Antivert    Family history of anesthesia complication    sister slow to wake up with anesthesia   Family history of breast cancer    Family history of colon cancer    Family history of uterine cancer    GERD (gastroesophageal reflux disease)    takes occasional TUMs   History of bronchitis    > 89yr[age]   History of colon polyps    History of hiatal hernia    Small noted on CT   History of pulmonary embolus ([place])    Hypertension    takes Losartan daily and HCTZ   Iron deficiency anemia    Joint pain    Numbness    to toes on each foot   Peripheral neuropathy    feet and toes   Personal history of radiation therapy    Pulmonary nodules    Noted on CT   Radiation 07/31/14-08/28/14   Left Breast 20 fxs   Seasonal allergies    takes Claritin prn   Urinary frequency    Vitamin D deficiency    takes VIt D daily  SURGICAL HISTORY: Past Surgical History:  Procedure Laterality Date   BREAST BIOPSY Bilateral    BREAST LUMPECTOMY Left    BREAST LUMPECTOMY WITH RADIOACTIVE SEED LOCALIZATION Left 07/01/2014   Procedure: LEFT BREAST LUMPECTOMY WITH RADIOACTIVE SEED LOCALIZATION;  Surgeon: Excell Seltzer, MD;  Location: [place];  Service: General;  Laterality: Left;   CATARACT EXTRACTION Right    COLONOSCOPY  09/24/2019   [place]   LAPAROSCOPIC PARTIAL COLECTOMY N/A 11/01/2019   Procedure: LAPAROSCOPIC PARTIAL COLECTOMY;  Surgeon: Leighton Ruff, MD;  Location: [facility];  Service: General;  Laterality: N/A;   POLYPECTOMY     PORTACATH PLACEMENT N/A 12/10/2019   Procedure: INSERTION PORT-A-CATH ULTRASOUND GUIDED IN RIGHT IJ;  Surgeon:  Leighton Ruff, MD;  Location: [facility];  Service: General;  Laterality: N/A;   ROBOTIC ASSISTED TOTAL HYSTERECTOMY Bilateral 12/22/2021   Procedure: XI ROBOTIC ASSISTED TOTAL HYSTERECTOMY WITH BILATERAL SALPINGO OOPHORECTOMY;  Surgeon: [name], MD;  Location: [facility];  Service: Gynecology;  Laterality: Bilateral;   TOTAL KNEE ARTHROPLASTY Left 10/24/2012   Procedure: TOTAL KNEE ARTHROPLASTY;  Surgeon: Kerin Salen, MD;  Location: [place];  Service: Orthopedics;  Laterality: Left;   TOTAL KNEE ARTHROPLASTY Right 01/09/2013   Procedure: TOTAL KNEE ARTHROPLASTY;  Surgeon: Kerin Salen, MD;  Location: [place];  Service: Orthopedics;  Laterality: Right;   TUBAL LIGATION      I have reviewed the social history and family history with the patient and they are unchanged from previous note.  ALLERGIES:  is allergic to oxycodone.  MEDICATIONS:  Current Outpatient Medications  Medication Sig Dispense Refill   acetaminophen (TYLENOL) 325 MG tablet Take 650 mg by mouth every 6 (six) hours as needed for moderate pain.     b complex vitamins capsule Take 1 capsule by mouth daily.     Cholecalciferol (VITAMIN D) 2000 UNITS CAPS Take 2,000 Units by mouth daily.      diphenoxylate-atropine (LOMOTIL) 2.5-0.025 MG tablet Take 1 tablet by mouth 4 (four) times daily as needed for diarrhea or loose stools. 60 tablet 0   Ibuprofen 200 MG CAPS Take by mouth.     loratadine (CLARITIN) 10 MG tablet Take 10 mg by mouth daily as needed for allergies.     losartan (COZAAR) 25 MG tablet Take 1 tablet (25 mg total) by mouth daily. 90 tablet 3   metFORMIN (GLUCOPHAGE) 500 MG tablet Take 1 tablet (500 mg total) by mouth in the morning and at bedtime. 180 tablet 3   Multiple Vitamins-Minerals (MULTIVITAMIN WITH MINERALS) tablet Take 1 tablet by mouth daily.     omeprazole (PRILOSEC) 20 MG capsule Take 20 mg by mouth daily before breakfast.      potassium chloride (KLOR-CON M) 10 MEQ tablet Take 1 tablet (10 mEq  total) by mouth daily. 30 tablet 1   pravastatin (PRAVACHOL) 10 MG tablet Take 1 tablet (10 mg total) by mouth daily. 90 tablet 3   pregabalin (LYRICA) 25 MG capsule Take 1 capsule (25 mg total) by mouth 2 (two) times daily. 60 capsule 3   No current facility-administered medications for this visit.    PHYSICAL EXAMINATION: ECOG PERFORMANCE STATUS: 2 - Symptomatic, <50% confined to bed  Vitals:   05/16/22 1010  BP: (!) 132/54  Pulse: 62  Resp: 16  Temp: 98.9 F (37.2 C)  SpO2: 100%   Wt Readings from Last 3 Encounters:  05/16/22 225 lb 11.2 oz (102.4 kg)  05/12/22 233 lb (105.7 kg)  05/03/22  236 lb (107 kg)     GENERAL:alert, no distress and comfortable SKIN: skin color normal, no rashes or significant lesions EYES: normal, Conjunctiva are pink and non-injected, sclera clear  NEURO: alert & oriented x 3 with fluent speech  LABORATORY DATA:  I have reviewed the data as listed    Latest Ref Rng & Units 05/16/2022    9:48 AM 05/03/2022    8:51 AM 04/18/2022   10:18 AM  CBC  WBC 4.0 - 10.5 K/uL 9.8  18.7  9.9   Hemoglobin 12.0 - 15.0 g/dL 9.3  8.3  9.4   Hematocrit 36.0 - 46.0 % 28.8  25.5  28.1   Platelets 150 - 400 K/uL 163  189  100         Latest Ref Rng & Units 05/16/2022    9:48 AM 05/03/2022    8:51 AM 04/18/2022   10:18 AM  CMP  Glucose 70 - 99 mg/dL 119  139  167   BUN 8 - 23 mg/dL '16  11  12   '$ Creatinine 0.44 - 1.00 mg/dL 0.95  0.73  0.78   Sodium 135 - 145 mmol/L 143  143  144   Potassium 3.5 - 5.1 mmol/L 4.1  3.9  3.2   Chloride 98 - 111 mmol/L 110  109  110   CO2 22 - 32 mmol/L '26  27  27   '$ Calcium 8.9 - 10.3 mg/dL 9.2  9.0  9.7   Total Protein 6.5 - 8.1 g/dL 6.2  6.1  5.8   Total Bilirubin 0.3 - 1.2 mg/dL 1.3  0.9  1.0   Alkaline Phos 38 - 126 U/L 118  116  129   AST 15 - 41 U/L 20  30  45   ALT 0 - 44 U/L '13  16  27       '$ RADIOGRAPHIC STUDIES: I have personally reviewed the radiological images as listed and agreed with the findings in the  report. No results found.    Orders Placed This Encounter  Procedures   CBC with Differential ([place] Only)    Standing Status:   Future    Standing Expiration Date:   06/14/2023   CMP ([place] only)    Standing Status:   Future    Standing Expiration Date:   06/14/2023   Total Protein, Urine dipstick    Standing Status:   Future    Standing Expiration Date:   06/14/2023   All questions were answered. The patient knows to call the clinic with any problems, questions or concerns. No barriers to learning was detected. The total time spent in the appointment was 30 minutes.     Truitt Merle, MD 05/16/2022   Felicity Coyer, CMA, am acting as scribe for Truitt Merle, MD.   I have reviewed the above documentation for accuracy and completeness, and I agree with the above.

## 2022-05-17 ENCOUNTER — Ambulatory Visit: Payer: Medicare Other

## 2022-05-18 ENCOUNTER — Other Ambulatory Visit: Payer: Self-pay

## 2022-05-18 ENCOUNTER — Inpatient Hospital Stay: Payer: Medicare Other

## 2022-05-18 VITALS — BP 132/55 | HR 64 | Temp 98.6°F | Resp 16

## 2022-05-18 DIAGNOSIS — D539 Nutritional anemia, unspecified: Secondary | ICD-10-CM | POA: Diagnosis not present

## 2022-05-18 DIAGNOSIS — I10 Essential (primary) hypertension: Secondary | ICD-10-CM | POA: Diagnosis not present

## 2022-05-18 DIAGNOSIS — C786 Secondary malignant neoplasm of retroperitoneum and peritoneum: Secondary | ICD-10-CM | POA: Diagnosis not present

## 2022-05-18 DIAGNOSIS — E1140 Type 2 diabetes mellitus with diabetic neuropathy, unspecified: Secondary | ICD-10-CM | POA: Diagnosis not present

## 2022-05-18 DIAGNOSIS — Z85828 Personal history of other malignant neoplasm of skin: Secondary | ICD-10-CM | POA: Diagnosis not present

## 2022-05-18 DIAGNOSIS — Z5189 Encounter for other specified aftercare: Secondary | ICD-10-CM | POA: Diagnosis not present

## 2022-05-18 DIAGNOSIS — C787 Secondary malignant neoplasm of liver and intrahepatic bile duct: Secondary | ICD-10-CM | POA: Diagnosis not present

## 2022-05-18 DIAGNOSIS — C182 Malignant neoplasm of ascending colon: Secondary | ICD-10-CM | POA: Diagnosis not present

## 2022-05-18 DIAGNOSIS — Z923 Personal history of irradiation: Secondary | ICD-10-CM | POA: Diagnosis not present

## 2022-05-18 DIAGNOSIS — R197 Diarrhea, unspecified: Secondary | ICD-10-CM | POA: Diagnosis not present

## 2022-05-18 DIAGNOSIS — C7963 Secondary malignant neoplasm of bilateral ovaries: Secondary | ICD-10-CM | POA: Diagnosis not present

## 2022-05-18 DIAGNOSIS — Z853 Personal history of malignant neoplasm of breast: Secondary | ICD-10-CM | POA: Diagnosis not present

## 2022-05-18 DIAGNOSIS — Z5111 Encounter for antineoplastic chemotherapy: Secondary | ICD-10-CM | POA: Diagnosis not present

## 2022-05-18 MED ORDER — HEPARIN SOD (PORK) LOCK FLUSH 100 UNIT/ML IV SOLN
500.0000 [IU] | Freq: Once | INTRAVENOUS | Status: AC | PRN
  Administered 2022-05-18: 500 [IU]

## 2022-05-18 MED ORDER — PEGFILGRASTIM-CBQV 6 MG/0.6ML ~~LOC~~ SOSY
6.0000 mg | PREFILLED_SYRINGE | Freq: Once | SUBCUTANEOUS | Status: AC
  Administered 2022-05-18: 6 mg via SUBCUTANEOUS
  Filled 2022-05-18: qty 0.6

## 2022-05-18 MED ORDER — SODIUM CHLORIDE 0.9% FLUSH
10.0000 mL | INTRAVENOUS | Status: DC | PRN
  Administered 2022-05-18: 10 mL

## 2022-05-27 ENCOUNTER — Telehealth: Payer: Self-pay | Admitting: Hematology

## 2022-05-27 MED FILL — Dexamethasone Sodium Phosphate Inj 100 MG/10ML: INTRAMUSCULAR | Qty: 1 | Status: AC

## 2022-05-27 NOTE — Telephone Encounter (Signed)
Spoke with patient confirming upcoming appointments  

## 2022-05-29 NOTE — Assessment & Plan Note (Signed)
pT3N2aM0 stage IIB, MSS, metastatic to b/l ovaries and peritoneum, KRAS mutation G12V (+)  -Initially diagnosed in 08/2019, s/p resection with Dr Thomas on 11/01/19. Path showed overall Stage IIIB cancer.  -s/p 6 months adjuvant FOLFOX, completed 06/01/20, oxaliplatin held for final 2 cycles due to neuropathy.  -s/p BSO and hysterectomy on 12/22/21 with Dr. Tucker. Path revealed metastatic moderately differentiated colonic adenocarcinoma involving both ovaries and peritoneum -she restarted FOLFOX on 01/24/22. -Due to disease progression, chemotherapy changed to second line FOLFIRI and bevacizumab on April 06, 2022. She has been tolerating moderately well    

## 2022-05-29 NOTE — Assessment & Plan Note (Signed)
-  Dx in 05/2014, s/p left lumpectomy with Dr Excell Seltzer, adjuvant RT with Dr Pablo Ledger, and Anastrozole 08/2014 - 01/13/20 under Dr Lindi Adie.  -DEXA 09/11/20 T score +1.2 normal.  -most recent mammogram on 10/04/21 was negative. Will continue yearly

## 2022-05-29 NOTE — Assessment & Plan Note (Signed)
-  Secondary to oxaliplatin -She is on Lyrica and B complex, continue to monitor.  Overall stable.

## 2022-05-30 ENCOUNTER — Inpatient Hospital Stay: Payer: Medicare Other

## 2022-05-30 ENCOUNTER — Encounter: Payer: Self-pay | Admitting: Hematology

## 2022-05-30 ENCOUNTER — Inpatient Hospital Stay (HOSPITAL_BASED_OUTPATIENT_CLINIC_OR_DEPARTMENT_OTHER): Payer: Medicare Other | Admitting: Hematology

## 2022-05-30 VITALS — BP 127/50 | HR 63 | Temp 97.7°F | Resp 15 | Ht 65.0 in | Wt 217.1 lb

## 2022-05-30 VITALS — BP 138/59 | HR 57 | Resp 16

## 2022-05-30 DIAGNOSIS — G620 Drug-induced polyneuropathy: Secondary | ICD-10-CM

## 2022-05-30 DIAGNOSIS — Z95828 Presence of other vascular implants and grafts: Secondary | ICD-10-CM

## 2022-05-30 DIAGNOSIS — C50919 Malignant neoplasm of unspecified site of unspecified female breast: Secondary | ICD-10-CM | POA: Diagnosis not present

## 2022-05-30 DIAGNOSIS — C7963 Secondary malignant neoplasm of bilateral ovaries: Secondary | ICD-10-CM

## 2022-05-30 DIAGNOSIS — T451X5A Adverse effect of antineoplastic and immunosuppressive drugs, initial encounter: Secondary | ICD-10-CM

## 2022-05-30 DIAGNOSIS — Z853 Personal history of malignant neoplasm of breast: Secondary | ICD-10-CM | POA: Diagnosis not present

## 2022-05-30 DIAGNOSIS — Z170 Estrogen receptor positive status [ER+]: Secondary | ICD-10-CM

## 2022-05-30 DIAGNOSIS — Z5111 Encounter for antineoplastic chemotherapy: Secondary | ICD-10-CM | POA: Diagnosis not present

## 2022-05-30 DIAGNOSIS — C786 Secondary malignant neoplasm of retroperitoneum and peritoneum: Secondary | ICD-10-CM

## 2022-05-30 DIAGNOSIS — C182 Malignant neoplasm of ascending colon: Secondary | ICD-10-CM

## 2022-05-30 DIAGNOSIS — R197 Diarrhea, unspecified: Secondary | ICD-10-CM | POA: Diagnosis not present

## 2022-05-30 DIAGNOSIS — C787 Secondary malignant neoplasm of liver and intrahepatic bile duct: Secondary | ICD-10-CM | POA: Diagnosis not present

## 2022-05-30 DIAGNOSIS — I10 Essential (primary) hypertension: Secondary | ICD-10-CM | POA: Diagnosis not present

## 2022-05-30 DIAGNOSIS — E1140 Type 2 diabetes mellitus with diabetic neuropathy, unspecified: Secondary | ICD-10-CM | POA: Diagnosis not present

## 2022-05-30 DIAGNOSIS — Z85828 Personal history of other malignant neoplasm of skin: Secondary | ICD-10-CM | POA: Diagnosis not present

## 2022-05-30 DIAGNOSIS — C7951 Secondary malignant neoplasm of bone: Secondary | ICD-10-CM

## 2022-05-30 DIAGNOSIS — Z5189 Encounter for other specified aftercare: Secondary | ICD-10-CM | POA: Diagnosis not present

## 2022-05-30 DIAGNOSIS — D539 Nutritional anemia, unspecified: Secondary | ICD-10-CM | POA: Diagnosis not present

## 2022-05-30 DIAGNOSIS — Z923 Personal history of irradiation: Secondary | ICD-10-CM | POA: Diagnosis not present

## 2022-05-30 LAB — CBC WITH DIFFERENTIAL (CANCER CENTER ONLY)
Abs Immature Granulocytes: 0.37 10*3/uL — ABNORMAL HIGH (ref 0.00–0.07)
Basophils Absolute: 0.1 10*3/uL (ref 0.0–0.1)
Basophils Relative: 1 %
Eosinophils Absolute: 0.1 10*3/uL (ref 0.0–0.5)
Eosinophils Relative: 1 %
HCT: 29 % — ABNORMAL LOW (ref 36.0–46.0)
Hemoglobin: 9.5 g/dL — ABNORMAL LOW (ref 12.0–15.0)
Immature Granulocytes: 4 %
Lymphocytes Relative: 24 %
Lymphs Abs: 2.4 10*3/uL (ref 0.7–4.0)
MCH: 36.4 pg — ABNORMAL HIGH (ref 26.0–34.0)
MCHC: 32.8 g/dL (ref 30.0–36.0)
MCV: 111.1 fL — ABNORMAL HIGH (ref 80.0–100.0)
Monocytes Absolute: 0.8 10*3/uL (ref 0.1–1.0)
Monocytes Relative: 8 %
Neutro Abs: 6.3 10*3/uL (ref 1.7–7.7)
Neutrophils Relative %: 62 %
Platelet Count: 163 10*3/uL (ref 150–400)
RBC: 2.61 MIL/uL — ABNORMAL LOW (ref 3.87–5.11)
RDW: 17.7 % — ABNORMAL HIGH (ref 11.5–15.5)
WBC Count: 10 10*3/uL (ref 4.0–10.5)
nRBC: 0.3 % — ABNORMAL HIGH (ref 0.0–0.2)

## 2022-05-30 LAB — CMP (CANCER CENTER ONLY)
ALT: 14 U/L (ref 0–44)
AST: 17 U/L (ref 15–41)
Albumin: 3.4 g/dL — ABNORMAL LOW (ref 3.5–5.0)
Alkaline Phosphatase: 219 U/L — ABNORMAL HIGH (ref 38–126)
Anion gap: 7 (ref 5–15)
BUN: 15 mg/dL (ref 8–23)
CO2: 25 mmol/L (ref 22–32)
Calcium: 8.9 mg/dL (ref 8.9–10.3)
Chloride: 110 mmol/L (ref 98–111)
Creatinine: 0.78 mg/dL (ref 0.44–1.00)
GFR, Estimated: >60 mL/min (ref >60)
Glucose, Bld: 144 mg/dL — ABNORMAL HIGH (ref 70–99)
Potassium: 3.3 mmol/L — ABNORMAL LOW (ref 3.5–5.1)
Sodium: 142 mmol/L (ref 135–145)
Total Bilirubin: 0.9 mg/dL (ref 0.3–1.2)
Total Protein: 6 g/dL — ABNORMAL LOW (ref 6.5–8.1)

## 2022-05-30 LAB — CEA (IN HOUSE-CHCC): CEA (CHCC-In House): 7.34 ng/mL — ABNORMAL HIGH (ref 0.00–5.00)

## 2022-05-30 MED ORDER — SODIUM CHLORIDE 0.9% FLUSH
10.0000 mL | INTRAVENOUS | Status: DC | PRN
  Administered 2022-05-30: 10 mL

## 2022-05-30 MED ORDER — PALONOSETRON HCL INJECTION 0.25 MG/5ML
0.2500 mg | Freq: Once | INTRAVENOUS | Status: AC
  Administered 2022-05-30: 0.25 mg via INTRAVENOUS
  Filled 2022-05-30: qty 5

## 2022-05-30 MED ORDER — SODIUM CHLORIDE 0.9 % IV SOLN
10.0000 mg | Freq: Once | INTRAVENOUS | Status: AC
  Administered 2022-05-30: 10 mg via INTRAVENOUS
  Filled 2022-05-30: qty 10

## 2022-05-30 MED ORDER — SODIUM CHLORIDE 0.9 % IV SOLN: Freq: Once | INTRAVENOUS | Status: AC

## 2022-05-30 MED ORDER — SODIUM CHLORIDE 0.9 % IV SOLN
5.0000 mg/kg | Freq: Once | INTRAVENOUS | Status: AC
  Administered 2022-05-30: 500 mg via INTRAVENOUS
  Filled 2022-05-30: qty 16

## 2022-05-30 MED ORDER — SODIUM CHLORIDE 0.9 % IV SOLN
100.0000 mg/m2 | Freq: Once | INTRAVENOUS | Status: AC
  Administered 2022-05-30: 220 mg via INTRAVENOUS
  Filled 2022-05-30: qty 11

## 2022-05-30 MED ORDER — SODIUM CHLORIDE 0.9 % IV SOLN
2240.0000 mg/m2 | INTRAVENOUS | Status: DC
  Administered 2022-05-30: 5000 mg via INTRAVENOUS
  Filled 2022-05-30: qty 100

## 2022-05-30 MED ORDER — SODIUM CHLORIDE 0.9 % IV SOLN
400.0000 mg/m2 | Freq: Once | INTRAVENOUS | Status: AC
  Administered 2022-05-30: 896 mg via INTRAVENOUS
  Filled 2022-05-30: qty 44.8

## 2022-05-30 NOTE — Patient Instructions (Signed)
[facility]  Discharge Instructions: Thank you for choosing [facility] to provide your oncology and hematology care.   If you have a lab appointment with the Cancer Center, please go directly to the Cancer Center and check in at the registration area.   Wear comfortable clothing and clothing appropriate for easy access to any Portacath or PICC line.   We strive to give you quality time with your provider. You may need to reschedule your appointment if you arrive late (15 or more minutes).  Arriving late affects you and other patients whose appointments are after yours.  Also, if you miss three or more appointments without notifying the office, you may be dismissed from the clinic at the provider's discretion.      For prescription refill requests, have your pharmacy contact our office and allow 72 hours for refills to be completed.    Today you received the following chemotherapy and/or immunotherapy agents: Bevacizumab, Irinotecan, Leucovorin, 5FU      To help prevent nausea and vomiting after your treatment, we encourage you to take your nausea medication as directed.  BELOW ARE SYMPTOMS THAT SHOULD BE REPORTED IMMEDIATELY: *FEVER GREATER THAN 100.4 F (38 C) OR HIGHER *CHILLS OR SWEATING *NAUSEA AND VOMITING THAT IS NOT CONTROLLED WITH YOUR NAUSEA MEDICATION *UNUSUAL SHORTNESS OF BREATH *UNUSUAL BRUISING OR BLEEDING *URINARY PROBLEMS (pain or burning when urinating, or frequent urination) *BOWEL PROBLEMS (unusual diarrhea, constipation, pain near the anus) TENDERNESS IN MOUTH AND THROAT WITH OR WITHOUT PRESENCE OF ULCERS (sore throat, sores in mouth, or a toothache) UNUSUAL RASH, SWELLING OR PAIN  UNUSUAL VAGINAL DISCHARGE OR ITCHING   Items with * indicate a potential emergency and should be followed up as soon as possible or go to the Emergency Department if any problems should occur.  Please show the CHEMOTHERAPY ALERT CARD or  IMMUNOTHERAPY ALERT CARD at check-in to the Emergency Department and triage nurse.  Should you have questions after your visit or need to cancel or reschedule your appointment, please contact [facility]  Dept: [phone]  and follow the prompts.  Office hours are 8:00 a.m. to 4:30 p.m. Monday - Friday. Please note that voicemails left after 4:00 p.m. may not be returned until the following business day.  We are closed weekends and major holidays. You have access to a nurse at all times for urgent questions. Please call the main number to the clinic Dept: [phone] and follow the prompts.   For any non-urgent questions, you may also contact your provider using MyChart. We now offer e-Visits for anyone 18 and older to request care online for non-urgent symptoms. For details visit [email or web address].   Also download the MyChart app! Go to the app store, search "MyChart", open the app, select [facility], and log in with your MyChart username and password.   

## 2022-05-30 NOTE — Progress Notes (Addendum)
[place]   Telephone:(336) [phone] [phone]   Clinic Follow up Note   Patient Care Team: [name], MD as PCP - General (Family Medicine) Rolm Bookbinder, MD as Consulting Physician (General Surgery) Truitt Merle, MD as Consulting Physician (Hematology) Leighton Ruff, MD as Consulting Physician (General Surgery) Nicholas Lose, MD as Consulting Physician (Hematology and Oncology)  Date of Service:  05/30/2022  CHIEF COMPLAINT: f/u of right colon cancer and h/o DCIS    CURRENT THERAPY: Second line FOLFIRI/beva started on 04/04/22  ASSESSMENT:  Kim Castro is a 77 y.o. female with   Malignant neoplasm of ascending colon ([place]) pT3N2aM0 stage IIB, MSS, metastatic to b/l ovaries and peritoneum, KRAS mutation G12V (+)  -Initially diagnosed in 08/2019, s/p resection with Dr Marcello Moores on 11/01/19. Path showed overall Stage IIIB cancer.  -s/p 6 months adjuvant FOLFOX, completed 06/01/20, oxaliplatin held for final 2 cycles due to neuropathy.  -s/p BSO and hysterectomy on 12/22/21 with Dr. Berline Lopes. Path revealed metastatic moderately differentiated colonic adenocarcinoma involving both ovaries and peritoneum -she restarted FOLFOX on 01/24/22. -Due to disease progression, chemotherapy changed to second line FOLFIRI and bevacizumab on April 06, 2022. She has been tolerating moderately well   Malignant neoplasm of female breast [facility]) -Dx in 05/2014, s/p left lumpectomy with Dr Excell Seltzer, adjuvant RT with Dr Pablo Ledger, and Anastrozole 08/2014 - 01/13/20 under Dr Lindi Adie.  -DEXA 09/11/20 T score +1.2 normal.  -most recent mammogram on 10/04/21 was negative. Will continue yearly  Peripheral neuropathy due to chemotherapy ([place]) -Secondary to oxaliplatin -She is on Lyrica and B complex, continue to monitor.  Overall stable.    Weight loss -She has been losing weight since she started FOLFIRI, she related diarrhea -I again encouraged her to take nutritional supplement,  and watch her weight.   PLAN: - needs to wear boot for foot pain - continue current therapy, lab reviewed, adequate for treatment, will proceed FOLFIRI at the same dose reduction -Follow-up in 2 weeks before next cycle chemotherapy -I ordered a CT scan to be done in 3 to 4 weeks for restaging    SUMMARY OF ONCOLOGIC HISTORY: Oncology History Overview Note   Cancer Staging  Malignant neoplasm of female breast [facility]) Staging form: Breast, AJCC 7th Edition - Clinical stage from 06/11/2014: Stage 0 (Tis (DCIS), N0, M0) - Unsigned Staged by: Pathologist and managing physician Laterality: Left Estrogen receptor status: Positive Progesterone receptor status: Positive Stage used in treatment planning: Yes National guidelines used in treatment planning: Yes Type of national guideline used in treatment planning: NCCN - Pathologic stage from 07/03/2014: Stage Unknown (Tis (DCIS), NX, cM0) - Signed by Enid Cutter, MD on 07/10/2014 Staged by: Pathologist Laterality: Left Estrogen receptor status: Positive Progesterone receptor status: Positive Stage used in treatment planning: Yes National guidelines used in treatment planning: Yes Type of national guideline used in treatment planning: NCCN Staging comments: Staged on final lumpectomy specimen by Dr. Donato Heinz.  Right colon cancer Staging form: Colon and Rectum, AJCC 8th Edition - Pathologic stage from 11/01/2019: Stage IIIB (pT3, pN2a, cM0) - Signed by Truitt Merle, MD on 11/29/2019 Stage prefix: Initial diagnosis Histologic grading system: 4 grade system Histologic grade (G): G2 Residual tumor (R): R0 - None Tumor deposits (TD): Absent Perineural invasion (PNI): Absent Microsatellite instability (MSI): Stable KRAS mutation: Unknown NRAS mutation: Unknown BRAF mutation: Unknown     Malignant neoplasm of female breast ([place])  05/29/2014 Initial Biopsy   Left breast needle core biopsy: Grade 2, DCIS with  calcs. ER+ (100%), PR+ (96%).     06/04/2014 Initial Diagnosis   Left breast DCIS with calcifications, ER 100%, PR 96%   06/10/2014 Breast MRI   Left breast: 2.4 x 1.3 x 1.1 cm area of patchy non-mass enhancement upper outer quadrant includes postbiopsy seroma; Right breast: 1.2 cm previously biopsied stable benign fibroadenoma   06/12/2014 Procedure   Genetic counseling/testing: Identified 1 VUS on CHEK2 gene. Remainder of 17 gene panel tested negative and included: ATM, BARD1, BRCA 1/2, BRIP1, CDH1, CHEK2, EPCAM, MLH1, MSH2, MSH6, NBN, NF1, PALB2, PTEN, RAD50, RAD51C, RAD51D, STK11, and TP53.    07/01/2014 Surgery   Left breast lumpectomy ([place]): Grade 1, DCIS, spanning 2.3 cm, 1 mm margin, ER 100%, PR 96%   07/31/2014 - 08/28/2014 Radiation Therapy   Adjuvant RT completed Pablo Ledger). Left breast: Total dose 42.5 Gy over 17 fractions. Left breast boost: Total dose 7.5 Gy over 3 fractions.    09/14/2014 - 01/13/2020 Anti-estrogen oral therapy   Anastrazole 36m daily. Planned duration of treatment: 5 years ([place]. Completed in 01/2020.    09/25/2014 Survivorship   Survivorship Care Plan given to patient and reviewed with her in person.    03/02/2021 Imaging   CT CAP  IMPRESSION: 1. No findings of active/recurrent malignancy. Partial right hemicolectomy. 2. Endometrial stripe remains mildly thickened, but endometrial biopsy in May was negative for malignancy. 3. Progressive endplate sclerosis and endplate irregularity at T2-3, probably due to degenerative endplate findings. If the has referable upper thoracic pain/symptoms then thoracic spine MRI could be used for further workup. 4. Other imaging findings of potential clinical significance: Mild cardiomegaly. Aortic Atherosclerosis (ICD10-I70.0). Mild mitral valve calcification. Postoperative findings in the left breast with adjacent radiation port anteriorly in the left upper lobe. Tiny pulmonary nodules in the left lower lobe are unchanged from earliest available  comparison of 10/17/2019 and probably benign although may merit surveillance. Multilevel lumbar impingement. Mild pelvic floor laxity.   [place]  09/19/2019 Imaging   CT AP W contrast 09/19/19  IMPRESSION Fullness in the cecum, cannot exclude a mass. No evidence for metastatic disease is identified.    09/24/2019 Procedure   Colonoscopy by Dr MEber Jones5/25/21 IMPRESSION 1. The colon was redundant  2. Mild diverticulosis was noted through the entire examined colon 3. Single 141mpolyp was found in the ascending colon; polypectomy was performed using snare cautery and biopsy forceps 4. Mild diverticulosis was notes in the descending colon and sigmoid colon.  5. Single polyp was found in the sigmoid colon, polypectomy was performed with cold forceps.  6. Single polyp was found in the rectosigmoid colon; polypectomy was performed with cold snare  7. Small internal hemorrhoids  8. Large mass was found at the cecum; multiple biopsies of the area were performed using cold forceps; injection (tattooing) was performed distal to the mass.    09/24/2019 Initial Biopsy   INTERPRETATION AND DIAGNOSIS:  A. Cecum, biopsy:  Invasive moderately differentiated adenocarcinoma.  see comment  B. Polyp @ ascending colon, polypectomy:  Tubular Adenoma  C. Polyp @ sigmoid colon Polypectomy:  hyperplastic polyp.  D. Polyp @ rectosigmoid colon, Polypectomy:  Hyperplastic Polyp      10/16/2019 Imaging   CT Chest IMPRESSION: 1. Multiple small pulmonary nodules measuring 5 mm or less in size in the lungs. These are nonspecific and are typically considered statistically likely benign. However, given the patient's history of primary malignancy, close attention on follow-up studies is recommended to ensure stability. 2.  Aortic atherosclerosis, in addition to right coronary artery disease. Assessment for potential risk factor modification, dietary therapy or pharmacologic  therapy may be warranted, if clinically indicated. 3. There are calcifications of the aortic valve and mitral annulus. Echocardiographic correlation for evaluation of potential valvular dysfunction may be warranted if clinically indicated. 4. Small hiatal hernia.   Aortic Atherosclerosis (ICD10-I70.0).   11/01/2019 Initial Diagnosis   Colon cancer ([place])   11/01/2019 Surgery   LAPAROSCOPIC PARTIAL COLECTOMY by Dr Marcello Moores and Dr Johney Maine   11/01/2019 Pathology Results   FINAL MICROSCOPIC DIAGNOSIS:   A. COLON, PROXIMAL RIGHT, COLECTOMY:  - Invasive colonic adenocarcinoma, 5 cm.  - Tumor invades through the muscularis propria into pericolonic tissues.   - Margins of resection are not involved.  - Metastatic carcinoma in (5) of (13) lymph nodes.  - See oncology table.    MSI Stable  Mismatch repair normal  MLH1 - Preserved nuclear expression (greater 50% tumor expression) MSH2 - Preserved nuclear expression (greater 50% tumor expression) MSH6 - Preserved nuclear expression (greater 50% tumor expression) PMS2 - Preserved nuclear expression (greater 50% tumor expression)   11/01/2019 Cancer Staging   Staging form: Colon and Rectum, AJCC 8th Edition - Pathologic stage from 11/01/2019: Stage IIIB (pT3, pN2a, cM0) - Signed by Truitt Merle, MD on 11/29/2019   12/10/2019 Procedure   PAC placed 12/10/19   12/17/2019 - 06/01/2020 Chemotherapy   FOLFOX q2weeks starting in 2 weeks starting 12/17/19. Held 01/27/20-02/10/20 due to b/l PE. Oxaliplatin held C11-12 due to neuropathy. Completed on 06/01/20   03/02/2021 Imaging   CT CAP  IMPRESSION: 1. No findings of active/recurrent malignancy. Partial right hemicolectomy. 2. Endometrial stripe remains mildly thickened, but endometrial biopsy in May was negative for malignancy. 3. Progressive endplate sclerosis and endplate irregularity at T2-3, probably due to degenerative endplate findings. If the has referable upper thoracic pain/symptoms then thoracic  spine MRI could be used for further workup. 4. Other imaging findings of potential clinical significance: Mild cardiomegaly. Aortic Atherosclerosis (ICD10-I70.0). Mild mitral valve calcification. Postoperative findings in the left breast with adjacent radiation port anteriorly in the left upper lobe. Tiny pulmonary nodules in the left lower lobe are unchanged from earliest available comparison of 10/17/2019 and probably benign although may merit surveillance. Multilevel lumbar impingement. Mild pelvic floor laxity.   08/26/2021 Imaging   EXAM: CT CHEST, ABDOMEN, AND PELVIS WITH CONTRAST  IMPRESSION: 1. Stable examination without new or progressive findings to suggest local recurrence or metastatic disease within the chest, abdomen, or pelvis. 2. Hepatomegaly with hepatic steatosis. 3. Sigmoid colonic diverticulosis without findings of acute diverticulitis. 4. Similar prominent endplate sclerosis and irregularity at T2-T3 is most consistent with Modic type endplate degenerative changes. However, if patient has referable upper thoracic pain consider further workup with thoracic spine MRI. 5. Similar thickening of the endometrial stripe measuring up to 8 mm, which was previously biopsied with results negative for malignancy. 6. Aortic Atherosclerosis (ICD10-I70.0).   11/10/2021 PET scan   IMPRESSION: 1. LEFT ovary is increased in size and is intensely hypermetabolic. While physiologic hypermetabolic ovarian tissue is not uncommon, the enlargement and asymmetric activity warrants further evaluation. Consider contrast pelvic MRI vs tissue sampling. 2. No evidence of metastatic colorectal carcinoma otherwise. 3. Post RIGHT hemicolectomy anatomy. 4. Evidence of radiation change in the LEFT upper lobe (remote breast cancer).     12/22/2021 Relapse/Recurrence    FINAL MICROSCOPIC DIAGNOSIS:   A. LEFT OVARY AND FALLOPIAN TUBE, SALPINGO OOPHORECTOMY:  - Metastatic moderately differentiated  colonic  adenocarcinoma involving left ovary  - Focal ovarian stromal calcification  - Segment of benign fallopian tube   B. UTERUS WITH RIGHT FALLOPIAN TUBE AND OVARY, HYSTERECTOMY AND SALPINGO-OOPHORECTOMY:  - Metastatic moderately differentiated colonic adenocarcinoma involving right ovary  - Focal invasive extensive adenomyosis  - Benign endometrial polyps  - Benign proliferative phase endometrium  - Hydrosalpinx of right fallopian tube  - Focal ovarian stromal calcification   C. PERITONEAL DEPOSITS, ANTERIOR CUL DE SAC, BIOPSY:  - Metastatic mucinous adenocarcinoma, consistent with colorectal primary    COMMENT:  Immunohistochemical stains show that the tumor cells are positive for CK20 and CDX2 while they are negative for CK7 and PAX8, consistent with above interpretation.    01/24/2022 - 03/23/2022 Chemotherapy   Patient is on Treatment Plan : COLORECTAL FOLFOX q14d x 3 months     04/01/2022 Imaging   CT AP IMPRESSION: 1. New omental metastatic disease. 2. Vague hypoattenuating lesion in the inferior right hepatic lobe is new and also worrisome for metastatic disease. 3. Similar small right lower lobe nodules. Recommend continued attention on follow-up. 4. Steatotic enlarged liver. 5.  Aortic atherosclerosis (ICD10-I70.0).   04/06/2022 -  Chemotherapy   Patient is on Treatment Plan : COLORECTAL FOLFIRI + Bevacizumab q14d        INTERVAL HISTORY:  Kim Castro is here for a follow up of right colon cancer and h/o DCIS    She was last seen by me on 05/16/2022.  She presents to the clinic alone, pt reports foot pain in the right foot around her heel for past 3 weeks, pt has had a weight loss of 20 lbs since last visit, pt active with grand kids, bowel movements are back to normal, occasional diarrhea.  She is otherwise doing very well, able to tolerating her routine activities.    All other systems were reviewed with the patient and are negative.  MEDICAL HISTORY:   Past Medical History:  Diagnosis Date   Aortic atherosclerosis (HCC)    Arthritis    feet, lower back   Basal cell carcinoma    arm   Breast cancer of upper-outer quadrant of left female breast ([place]) 06/04/2014   Cataract    immature on the left   Colon cancer ([place]) 08/2019   Diabetes mellitus without complication (HCC)    Diverticulosis    Dizziness    > 71yr ago;took Antivert    Family history of anesthesia complication    sister slow to wake up with anesthesia   Family history of breast cancer    Family history of colon cancer    Family history of uterine cancer    GERD (gastroesophageal reflux disease)    takes occasional TUMs   History of bronchitis    > 23[place][age]   History of colon polyps    History of hiatal hernia    Small noted on CT   History of pulmonary embolus ([place])    Hypertension    takes Losartan daily and HCTZ   Iron deficiency anemia    Joint pain    Numbness    to toes on each foot   Peripheral neuropathy    feet and toes   Personal history of radiation therapy    Pulmonary nodules    Noted on CT   Radiation 07/31/14-08/28/14   Left Breast 20 fxs   Seasonal allergies    takes Claritin prn   Urinary frequency    Vitamin D deficiency    takes VIt  D daily    SURGICAL HISTORY: Past Surgical History:  Procedure Laterality Date   BREAST BIOPSY Bilateral    BREAST LUMPECTOMY Left    BREAST LUMPECTOMY WITH RADIOACTIVE SEED LOCALIZATION Left 07/01/2014   Procedure: LEFT BREAST LUMPECTOMY WITH RADIOACTIVE SEED LOCALIZATION;  Surgeon: Excell Seltzer, MD;  Location: [place];  Service: General;  Laterality: Left;   CATARACT EXTRACTION Right    COLONOSCOPY  09/24/2019   [place]   LAPAROSCOPIC PARTIAL COLECTOMY N/A 11/01/2019   Procedure: LAPAROSCOPIC PARTIAL COLECTOMY;  Surgeon: Leighton Ruff, MD;  Location: [facility];  Service: General;  Laterality: N/A;   POLYPECTOMY     PORTACATH PLACEMENT N/A 12/10/2019   Procedure: INSERTION  PORT-A-CATH ULTRASOUND GUIDED IN RIGHT IJ;  Surgeon: Leighton Ruff, MD;  Location: [facility];  Service: General;  Laterality: N/A;   ROBOTIC ASSISTED TOTAL HYSTERECTOMY Bilateral 12/22/2021   Procedure: XI ROBOTIC ASSISTED TOTAL HYSTERECTOMY WITH BILATERAL SALPINGO OOPHORECTOMY;  Surgeon: [name], MD;  Location: [facility];  Service: Gynecology;  Laterality: Bilateral;   TOTAL KNEE ARTHROPLASTY Left 10/24/2012   Procedure: TOTAL KNEE ARTHROPLASTY;  Surgeon: Kerin Salen, MD;  Location: [place];  Service: Orthopedics;  Laterality: Left;   TOTAL KNEE ARTHROPLASTY Right 01/09/2013   Procedure: TOTAL KNEE ARTHROPLASTY;  Surgeon: Kerin Salen, MD;  Location: [place];  Service: Orthopedics;  Laterality: Right;   TUBAL LIGATION      I have reviewed the social history and family history with the patient and they are unchanged from previous note.  ALLERGIES:  is allergic to oxycodone.  MEDICATIONS:  Current Outpatient Medications  Medication Sig Dispense Refill   acetaminophen (TYLENOL) 325 MG tablet Take 650 mg by mouth every 6 (six) hours as needed for moderate pain.     b complex vitamins capsule Take 1 capsule by mouth daily.     Cholecalciferol (VITAMIN D) 2000 UNITS CAPS Take 2,000 Units by mouth daily.      diphenoxylate-atropine (LOMOTIL) 2.5-0.025 MG tablet Take 1 tablet by mouth 4 (four) times daily as needed for diarrhea or loose stools. 60 tablet 0   Ibuprofen 200 MG CAPS Take by mouth.     loratadine (CLARITIN) 10 MG tablet Take 10 mg by mouth daily as needed for allergies.     losartan (COZAAR) 25 MG tablet Take 1 tablet (25 mg total) by mouth daily. 90 tablet 3   metFORMIN (GLUCOPHAGE) 500 MG tablet Take 1 tablet (500 mg total) by mouth in the morning and at bedtime. 180 tablet 3   Multiple Vitamins-Minerals (MULTIVITAMIN WITH MINERALS) tablet Take 1 tablet by mouth daily.     omeprazole (PRILOSEC) 20 MG capsule Take 20 mg by mouth daily before breakfast.      potassium chloride  (KLOR-CON M) 10 MEQ tablet Take 1 tablet (10 mEq total) by mouth daily. 30 tablet 1   pravastatin (PRAVACHOL) 10 MG tablet Take 1 tablet (10 mg total) by mouth daily. 90 tablet 3   pregabalin (LYRICA) 25 MG capsule Take 1 capsule (25 mg total) by mouth 2 (two) times daily. 60 capsule 3   No current facility-administered medications for this visit.    PHYSICAL EXAMINATION: ECOG PERFORMANCE STATUS: 1 - Symptomatic but completely ambulatory  Vitals:   05/30/22 0916  BP: (!) 127/50  Pulse: 63  Resp: 15  Temp: 97.7 F (36.5 C)  SpO2: 99%   Wt Readings from Last 3 Encounters:  05/30/22 217 lb 1.6 oz (98.5 kg)  05/16/22 225 lb  11.2 oz (102.4 kg)  05/12/22 233 lb (105.7 kg)     GENERAL:alert, no distress and comfortable SKIN: skin color normal, no rashes or significant lesions EYES: normal, Conjunctiva are pink and non-injected, sclera clear  NEURO: alert & oriented x 3 with fluent speech  LABORATORY DATA:  I have reviewed the data as listed    Latest Ref Rng & Units 05/30/2022    8:47 AM 05/16/2022    9:48 AM 05/03/2022    8:51 AM  CBC  WBC 4.0 - 10.5 K/uL 10.0  9.8  18.7   Hemoglobin 12.0 - 15.0 g/dL 9.5  9.3  8.3   Hematocrit 36.0 - 46.0 % 29.0  28.8  25.5   Platelets 150 - 400 K/uL 163  163  189         Latest Ref Rng & Units 05/30/2022    8:47 AM 05/16/2022    9:48 AM 05/03/2022    8:51 AM  CMP  Glucose 70 - 99 mg/dL 144  119  139   BUN 8 - 23 mg/dL 15  16  11   $ Creatinine 0.44 - 1.00 mg/dL 0.78  0.95  0.73   Sodium 135 - 145 mmol/L 142  143  143   Potassium 3.5 - 5.1 mmol/L 3.3  4.1  3.9   Chloride 98 - 111 mmol/L 110  110  109   CO2 22 - 32 mmol/L 25  26  27   $ Calcium 8.9 - 10.3 mg/dL 8.9  9.2  9.0   Total Protein 6.5 - 8.1 g/dL 6.0  6.2  6.1   Total Bilirubin 0.3 - 1.2 mg/dL 0.9  1.3  0.9   Alkaline Phos 38 - 126 U/L 219  118  116   AST 15 - 41 U/L 17  20  30   $ ALT 0 - 44 U/L 14  13  16       $ RADIOGRAPHIC STUDIES: I have personally reviewed the  radiological images as listed and agreed with the findings in the report. No results found.    Orders Placed This Encounter  Procedures   CBC with Differential ([facility] Only)    Standing Status:   Future    Standing Expiration Date:   06/28/2023   CMP ([facility] only)    Standing Status:   Future    Standing Expiration Date:   06/28/2023   Total Protein, Urine dipstick    Standing Status:   Future    Standing Expiration Date:   06/28/2023   All questions were answered. The patient knows to call the clinic with any problems, questions or concerns. No barriers to learning was detected. The total time spent in the appointment was 30 minutes.     Truitt Merle, MD 05/30/2022   I, Maurine Simmering, CMA, am acting as scribe for Truitt Merle, MD.   I have reviewed the above documentation for accuracy and completeness, and I agree with the above.

## 2022-05-31 ENCOUNTER — Other Ambulatory Visit: Payer: Self-pay

## 2022-05-31 ENCOUNTER — Encounter: Payer: Self-pay | Admitting: Hematology

## 2022-05-31 ENCOUNTER — Telehealth: Payer: Self-pay | Admitting: Hematology

## 2022-05-31 DIAGNOSIS — C50919 Malignant neoplasm of unspecified site of unspecified female breast: Secondary | ICD-10-CM

## 2022-05-31 DIAGNOSIS — C182 Malignant neoplasm of ascending colon: Secondary | ICD-10-CM

## 2022-05-31 NOTE — Telephone Encounter (Signed)
Spoke with patient confirming upcoming appointments  

## 2022-06-01 ENCOUNTER — Other Ambulatory Visit: Payer: Self-pay

## 2022-06-01 ENCOUNTER — Inpatient Hospital Stay: Payer: Medicare Other

## 2022-06-01 DIAGNOSIS — Z853 Personal history of malignant neoplasm of breast: Secondary | ICD-10-CM | POA: Diagnosis not present

## 2022-06-01 DIAGNOSIS — Z5111 Encounter for antineoplastic chemotherapy: Secondary | ICD-10-CM | POA: Diagnosis not present

## 2022-06-01 DIAGNOSIS — R197 Diarrhea, unspecified: Secondary | ICD-10-CM | POA: Diagnosis not present

## 2022-06-01 DIAGNOSIS — C787 Secondary malignant neoplasm of liver and intrahepatic bile duct: Secondary | ICD-10-CM | POA: Diagnosis not present

## 2022-06-01 DIAGNOSIS — Z5189 Encounter for other specified aftercare: Secondary | ICD-10-CM | POA: Diagnosis not present

## 2022-06-01 DIAGNOSIS — Z923 Personal history of irradiation: Secondary | ICD-10-CM | POA: Diagnosis not present

## 2022-06-01 DIAGNOSIS — D539 Nutritional anemia, unspecified: Secondary | ICD-10-CM | POA: Diagnosis not present

## 2022-06-01 DIAGNOSIS — E1140 Type 2 diabetes mellitus with diabetic neuropathy, unspecified: Secondary | ICD-10-CM | POA: Diagnosis not present

## 2022-06-01 DIAGNOSIS — I10 Essential (primary) hypertension: Secondary | ICD-10-CM | POA: Diagnosis not present

## 2022-06-01 DIAGNOSIS — C182 Malignant neoplasm of ascending colon: Secondary | ICD-10-CM | POA: Diagnosis not present

## 2022-06-01 DIAGNOSIS — C7963 Secondary malignant neoplasm of bilateral ovaries: Secondary | ICD-10-CM | POA: Diagnosis not present

## 2022-06-01 DIAGNOSIS — C786 Secondary malignant neoplasm of retroperitoneum and peritoneum: Secondary | ICD-10-CM | POA: Diagnosis not present

## 2022-06-01 DIAGNOSIS — Z85828 Personal history of other malignant neoplasm of skin: Secondary | ICD-10-CM | POA: Diagnosis not present

## 2022-06-01 MED ORDER — PEGFILGRASTIM-CBQV 6 MG/0.6ML ~~LOC~~ SOSY
6.0000 mg | PREFILLED_SYRINGE | Freq: Once | SUBCUTANEOUS | Status: AC
  Administered 2022-06-01: 6 mg via SUBCUTANEOUS
  Filled 2022-06-01: qty 0.6

## 2022-06-01 MED ORDER — SODIUM CHLORIDE 0.9% FLUSH
10.0000 mL | INTRAVENOUS | Status: DC | PRN
  Administered 2022-06-01: 10 mL

## 2022-06-01 MED ORDER — HEPARIN SOD (PORK) LOCK FLUSH 100 UNIT/ML IV SOLN
500.0000 [IU] | Freq: Once | INTRAVENOUS | Status: AC | PRN
  Administered 2022-06-01: 500 [IU]

## 2022-06-10 MED FILL — Dexamethasone Sodium Phosphate Inj 100 MG/10ML: INTRAMUSCULAR | Qty: 1 | Status: AC

## 2022-06-12 NOTE — Assessment & Plan Note (Signed)
-  Dx in 05/2014, s/p left lumpectomy with Dr Excell Seltzer, adjuvant RT with Dr Pablo Ledger, and Anastrozole 08/2014 - 01/13/20 under Dr Lindi Adie.  -DEXA 09/11/20 T score +1.2 normal.  -most recent mammogram on 10/04/21 was negative. Will continue yearly

## 2022-06-12 NOTE — Assessment & Plan Note (Signed)
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 Path showed overall Stage IIIB cancer.  -s/p 6 months adjuvant FOLFOX, completed 06/01/20, oxaliplatin held for final 2 cycles due to neuropathy.  -s/p BSO and hysterectomy on 12/22/21 with Dr. Berline Lopes. Path revealed metastatic moderately differentiated colonic adenocarcinoma involving both ovaries and peritoneum -she restarted FOLFOX on 01/24/22. -Due to disease progression, chemotherapy changed to second line FOLFIRI and bevacizumab on April 06, 2022. She has been tolerating moderately well

## 2022-06-12 NOTE — Assessment & Plan Note (Signed)
-  Secondary to oxaliplatin -She is on Lyrica and B complex, continue to monitor.  Overall stable.

## 2022-06-13 ENCOUNTER — Encounter: Payer: Self-pay | Admitting: Hematology

## 2022-06-13 ENCOUNTER — Inpatient Hospital Stay: Payer: Medicare Other

## 2022-06-13 ENCOUNTER — Inpatient Hospital Stay (HOSPITAL_BASED_OUTPATIENT_CLINIC_OR_DEPARTMENT_OTHER): Payer: Medicare Other | Admitting: Hematology

## 2022-06-13 ENCOUNTER — Inpatient Hospital Stay: Payer: Medicare Other | Attending: Hematology

## 2022-06-13 ENCOUNTER — Other Ambulatory Visit: Payer: Self-pay

## 2022-06-13 VITALS — BP 160/70 | HR 60 | Resp 16

## 2022-06-13 VITALS — BP 159/70 | HR 71 | Temp 98.6°F | Resp 18 | Ht 65.0 in | Wt 222.5 lb

## 2022-06-13 DIAGNOSIS — Z853 Personal history of malignant neoplasm of breast: Secondary | ICD-10-CM | POA: Diagnosis not present

## 2022-06-13 DIAGNOSIS — C7963 Secondary malignant neoplasm of bilateral ovaries: Secondary | ICD-10-CM | POA: Insufficient documentation

## 2022-06-13 DIAGNOSIS — C182 Malignant neoplasm of ascending colon: Secondary | ICD-10-CM

## 2022-06-13 DIAGNOSIS — Z5111 Encounter for antineoplastic chemotherapy: Secondary | ICD-10-CM | POA: Diagnosis not present

## 2022-06-13 DIAGNOSIS — E1136 Type 2 diabetes mellitus with diabetic cataract: Secondary | ICD-10-CM | POA: Diagnosis not present

## 2022-06-13 DIAGNOSIS — Z8049 Family history of malignant neoplasm of other genital organs: Secondary | ICD-10-CM

## 2022-06-13 DIAGNOSIS — Z803 Family history of malignant neoplasm of breast: Secondary | ICD-10-CM

## 2022-06-13 DIAGNOSIS — G620 Drug-induced polyneuropathy: Secondary | ICD-10-CM | POA: Diagnosis not present

## 2022-06-13 DIAGNOSIS — C786 Secondary malignant neoplasm of retroperitoneum and peritoneum: Secondary | ICD-10-CM | POA: Insufficient documentation

## 2022-06-13 DIAGNOSIS — Z800 Family history of malignant neoplasm of digestive organs: Secondary | ICD-10-CM

## 2022-06-13 DIAGNOSIS — E1142 Type 2 diabetes mellitus with diabetic polyneuropathy: Secondary | ICD-10-CM | POA: Diagnosis not present

## 2022-06-13 DIAGNOSIS — I10 Essential (primary) hypertension: Secondary | ICD-10-CM

## 2022-06-13 DIAGNOSIS — Z5189 Encounter for other specified aftercare: Secondary | ICD-10-CM | POA: Insufficient documentation

## 2022-06-13 DIAGNOSIS — Z170 Estrogen receptor positive status [ER+]: Secondary | ICD-10-CM

## 2022-06-13 DIAGNOSIS — Z95828 Presence of other vascular implants and grafts: Secondary | ICD-10-CM

## 2022-06-13 DIAGNOSIS — T451X5A Adverse effect of antineoplastic and immunosuppressive drugs, initial encounter: Secondary | ICD-10-CM

## 2022-06-13 DIAGNOSIS — E119 Type 2 diabetes mellitus without complications: Secondary | ICD-10-CM

## 2022-06-13 LAB — CBC WITH DIFFERENTIAL (CANCER CENTER ONLY)
Abs Immature Granulocytes: 0.68 10*3/uL — ABNORMAL HIGH (ref 0.00–0.07)
Basophils Absolute: 0.1 10*3/uL (ref 0.0–0.1)
Basophils Relative: 1 %
Eosinophils Absolute: 0.2 10*3/uL (ref 0.0–0.5)
Eosinophils Relative: 2 %
HCT: 29.5 % — ABNORMAL LOW (ref 36.0–46.0)
Hemoglobin: 9.5 g/dL — ABNORMAL LOW (ref 12.0–15.0)
Immature Granulocytes: 6 %
Lymphocytes Relative: 21 %
Lymphs Abs: 2.2 10*3/uL (ref 0.7–4.0)
MCH: 35.8 pg — ABNORMAL HIGH (ref 26.0–34.0)
MCHC: 32.2 g/dL (ref 30.0–36.0)
MCV: 111.3 fL — ABNORMAL HIGH (ref 80.0–100.0)
Monocytes Absolute: 0.8 10*3/uL (ref 0.1–1.0)
Monocytes Relative: 8 %
Neutro Abs: 6.7 10*3/uL (ref 1.7–7.7)
Neutrophils Relative %: 62 %
Platelet Count: 184 10*3/uL (ref 150–400)
RBC: 2.65 MIL/uL — ABNORMAL LOW (ref 3.87–5.11)
RDW: 16.8 % — ABNORMAL HIGH (ref 11.5–15.5)
WBC Count: 10.6 10*3/uL — ABNORMAL HIGH (ref 4.0–10.5)
nRBC: 0.3 % — ABNORMAL HIGH (ref 0.0–0.2)

## 2022-06-13 LAB — CEA (IN HOUSE-CHCC): CEA (CHCC-In House): 7.26 ng/mL — ABNORMAL HIGH (ref 0.00–5.00)

## 2022-06-13 LAB — CMP (CANCER CENTER ONLY)
ALT: 15 U/L (ref 0–44)
AST: 23 U/L (ref 15–41)
Albumin: 3.4 g/dL — ABNORMAL LOW (ref 3.5–5.0)
Alkaline Phosphatase: 151 U/L — ABNORMAL HIGH (ref 38–126)
Anion gap: 5 (ref 5–15)
BUN: 14 mg/dL (ref 8–23)
CO2: 30 mmol/L (ref 22–32)
Calcium: 8.4 mg/dL — ABNORMAL LOW (ref 8.9–10.3)
Chloride: 110 mmol/L (ref 98–111)
Creatinine: 0.79 mg/dL (ref 0.44–1.00)
GFR, Estimated: >60 mL/min (ref >60)
Glucose, Bld: 139 mg/dL — ABNORMAL HIGH (ref 70–99)
Potassium: 3.4 mmol/L — ABNORMAL LOW (ref 3.5–5.1)
Sodium: 145 mmol/L (ref 135–145)
Total Bilirubin: 0.7 mg/dL (ref 0.3–1.2)
Total Protein: 5.7 g/dL — ABNORMAL LOW (ref 6.5–8.1)

## 2022-06-13 LAB — TOTAL PROTEIN, URINE DIPSTICK: Protein, ur: NEGATIVE mg/dL

## 2022-06-13 MED ORDER — SODIUM CHLORIDE 0.9% FLUSH: 10.0000 mL | INTRAVENOUS | Status: DC | PRN

## 2022-06-13 MED ORDER — SODIUM CHLORIDE 0.9% FLUSH
10.0000 mL | INTRAVENOUS | Status: DC | PRN
  Administered 2022-06-13: 10 mL

## 2022-06-13 MED ORDER — SODIUM CHLORIDE 0.9 % IV SOLN
10.0000 mg | Freq: Once | INTRAVENOUS | Status: AC
  Administered 2022-06-13: 10 mg via INTRAVENOUS
  Filled 2022-06-13: qty 10

## 2022-06-13 MED ORDER — SODIUM CHLORIDE 0.9 % IV SOLN: Freq: Once | INTRAVENOUS | Status: AC

## 2022-06-13 MED ORDER — SODIUM CHLORIDE 0.9 % IV SOLN
400.0000 mg/m2 | Freq: Once | INTRAVENOUS | Status: AC
  Administered 2022-06-13: 896 mg via INTRAVENOUS
  Filled 2022-06-13: qty 44.8

## 2022-06-13 MED ORDER — PALONOSETRON HCL INJECTION 0.25 MG/5ML
0.2500 mg | Freq: Once | INTRAVENOUS | Status: AC
  Administered 2022-06-13: 0.25 mg via INTRAVENOUS
  Filled 2022-06-13: qty 5

## 2022-06-13 MED ORDER — SODIUM CHLORIDE 0.9 % IV SOLN
5.0000 mg/kg | Freq: Once | INTRAVENOUS | Status: AC
  Administered 2022-06-13: 500 mg via INTRAVENOUS
  Filled 2022-06-13: qty 16

## 2022-06-13 MED ORDER — SODIUM CHLORIDE 0.9 % IV SOLN
2400.0000 mg/m2 | INTRAVENOUS | Status: DC
  Administered 2022-06-13: 5000 mg via INTRAVENOUS
  Filled 2022-06-13: qty 100

## 2022-06-13 MED ORDER — SODIUM CHLORIDE 0.9 % IV SOLN
100.0000 mg/m2 | Freq: Once | INTRAVENOUS | Status: AC
  Administered 2022-06-13: 220 mg via INTRAVENOUS
  Filled 2022-06-13: qty 11

## 2022-06-13 NOTE — Progress Notes (Signed)
Patient refusing Atropine prior to Irinotecan infusion. Education completed with patient.

## 2022-06-13 NOTE — Patient Instructions (Signed)
[facility]  Discharge Instructions: Thank you for choosing [facility] to provide your oncology and hematology care.   If you have a lab appointment with the [facility], please go directly to the [facility] and check in at the registration area.   Wear comfortable clothing and clothing appropriate for easy access to any Portacath or PICC line.   We strive to give you quality time with your provider. You may need to reschedule your appointment if you arrive late (15 or more minutes).  Arriving late affects you and other patients whose appointments are after yours.  Also, if you miss three or more appointments without notifying the office, you may be dismissed from the clinic at the provider's discretion.      For prescription refill requests, have your pharmacy contact our office and allow 72 hours for refills to be completed.    Today you received the following chemotherapy and/or immunotherapy agents : Bevacizumab, Irinotecan, Leucovorin, 5FU      To help prevent nausea and vomiting after your treatment, we encourage you to take your nausea medication as directed.  BELOW ARE SYMPTOMS THAT SHOULD BE REPORTED IMMEDIATELY: *FEVER GREATER THAN 100.4 F (38 C) OR HIGHER *CHILLS OR SWEATING *NAUSEA AND VOMITING THAT IS NOT CONTROLLED WITH YOUR NAUSEA MEDICATION *UNUSUAL SHORTNESS OF BREATH *UNUSUAL BRUISING OR BLEEDING *URINARY PROBLEMS (pain or burning when urinating, or frequent urination) *BOWEL PROBLEMS (unusual diarrhea, constipation, pain near the anus) TENDERNESS IN MOUTH AND THROAT WITH OR WITHOUT PRESENCE OF ULCERS (sore throat, sores in mouth, or a toothache) UNUSUAL RASH, SWELLING OR PAIN  UNUSUAL VAGINAL DISCHARGE OR ITCHING   Items with * indicate a potential emergency and should be followed up as soon as possible or go to the Emergency Department if any problems should occur.  Please show the CHEMOTHERAPY ALERT CARD or  IMMUNOTHERAPY ALERT CARD at check-in to the Emergency Department and triage nurse.  Should you have questions after your visit or need to cancel or reschedule your appointment, please contact [facility]  Dept: [phone]  and follow the prompts.  Office hours are 8:00 a.m. to 4:30 p.m. Monday - Friday. Please note that voicemails left after 4:00 p.m. may not be returned until the following business day.  We are closed weekends and major holidays. You have access to a nurse at all times for urgent questions. Please call the main number to the clinic Dept: [phone] and follow the prompts.   For any non-urgent questions, you may also contact your provider using MyChart. We now offer e-Visits for anyone 79 and older to request care online for non-urgent symptoms. For details visit [email or web address].   Also download the MyChart app! Go to the app store, search "MyChart", open the app, select [facility], and log in with your MyChart username and password.

## 2022-06-13 NOTE — Progress Notes (Signed)
Nutrition Assessment   Reason for Assessment:  Patient identified on Malnutrition Screening report for weight loss   ASSESSMENT:  77 year old female with right colon cancer.  Past medical history of DCIS, lumpectomy with radiation and anastrozole, DM, GERD, HTN.  Patient receiving folfiri and bevacizumab.  Met with patient during infusion.  Patient reports that appetite is typically decreased for about 4-5 days after treatment.  Has taste alterations that also effect ability to eat.  Reports that appetite typically picks back up prior to next cycle of treatment.     Medications: b complex vitamins, Vit D, lomotil, metformin, MVI, prilosec, KCL   Labs: K 3.4, glucose 139, calcium 8.4   Anthropometrics:   Height: 65 inches Weight: 222 lb 8 oz on 2/12 248 lb 10/11 BMI: 37  10% weight loss in the last 4 months    NUTRITION DIAGNOSIS: Inadequate oral intake related to cancer and cancer related treatment side effects as evidenced by 10% weight loss in the last 4 months and decreased appetite with taste alterations   INTERVENTION:  Discussed strategies to help with taste alterations. Handout provided Contact information provided   MONITORING, EVALUATION, GOAL: weight trends, intake   Next Visit: Monday, Feb 26 during infusion  Louvina Cleary B. Zenia Resides, [place], [place] Registered Dietitian [phone]

## 2022-06-13 NOTE — Progress Notes (Signed)
[facility]   [phone] [phone]   Clinic Follow up Note   Patient Care Team: [name], MD as PCP - General (Family Medicine) Rolm Bookbinder, MD as Consulting Physician (General Surgery) Truitt Merle, MD as Consulting Physician (Hematology) Leighton Ruff, MD as Consulting Physician (General Surgery) Nicholas Lose, MD as Consulting Physician (Hematology and Oncology)  Date of Service:  06/13/2022  CHIEF COMPLAINT: f/u of right colon cancer and h/o DCIS      CURRENT THERAPY:  Second line FOLFIRI/beva started on 04/04/22    ASSESSMENT:  Kim Castro is a 77 y.o. female with   Malignant neoplasm of ascending colon ([place]) pT3N2aM0 stage IIB, MSS, metastatic to b/l ovaries and peritoneum, KRAS mutation G12V (+)  -Initially diagnosed in 08/2019, s/p resection with Dr Marcello Moores on 11/01/19. Path showed overall Stage IIIB cancer.  -s/p 6 months adjuvant FOLFOX, completed 06/01/20, oxaliplatin held for final 2 cycles due to neuropathy.  -s/p BSO and hysterectomy on 12/22/21 with Dr. Berline Lopes. Path revealed metastatic moderately differentiated colonic adenocarcinoma involving both ovaries and peritoneum -she restarted FOLFOX on 01/24/22. -Due to disease progression, chemotherapy changed to second line FOLFIRI and bevacizumab on April 06, 2022. She has been tolerating moderately well, better now with dose reduction  -she is scheduled for restaging CT scan next week.  Her tumor marker CEA has dropped significantly since she started FOLFIRI, indicating good response.    Malignant neoplasm of female breast [facility]) -Dx in 05/2014, s/p left lumpectomy with Dr Excell Seltzer, adjuvant RT with Dr Pablo Ledger, and Anastrozole 08/2014 - 01/13/20 under Dr Lindi Adie.  -DEXA 09/11/20 T score +1.2 normal.  -most recent mammogram on 10/04/21 was negative. Will continue yearly    Peripheral neuropathy due to chemotherapy ([place]) -Secondary to oxaliplatin -She is on Lyrica and B  complex, continue to monitor.  Overall stable.      PLAN: -lab reviewed -proceed with  C6 FOLFIRI at same dose reduction -lab,flush,f/u FOLFIRI 06/27/2022, restaging CT scan is scheduled for next week.  SUMMARY OF ONCOLOGIC HISTORY: Oncology History Overview Note   Cancer Staging  Malignant neoplasm of female breast [facility]) Staging form: Breast, AJCC 7th Edition - Clinical stage from 06/11/2014: Stage 0 (Tis (DCIS), N0, M0) - Unsigned Staged by: Pathologist and managing physician Laterality: Left Estrogen receptor status: Positive Progesterone receptor status: Positive Stage used in treatment planning: Yes National guidelines used in treatment planning: Yes Type of national guideline used in treatment planning: NCCN - Pathologic stage from 07/03/2014: Stage Unknown (Tis (DCIS), NX, cM0) - Signed by Enid Cutter, MD on 07/10/2014 Staged by: Pathologist Laterality: Left Estrogen receptor status: Positive Progesterone receptor status: Positive Stage used in treatment planning: Yes National guidelines used in treatment planning: Yes Type of national guideline used in treatment planning: NCCN Staging comments: Staged on final lumpectomy specimen by Dr. Donato Heinz.  Right colon cancer Staging form: Colon and Rectum, AJCC 8th Edition - Pathologic stage from 11/01/2019: Stage IIIB (pT3, pN2a, cM0) - Signed by Truitt Merle, MD on 11/29/2019 Stage prefix: Initial diagnosis Histologic grading system: 4 grade system Histologic grade (G): G2 Residual tumor (R): R0 - None Tumor deposits (TD): Absent Perineural invasion (PNI): Absent Microsatellite instability (MSI): Stable KRAS mutation: Unknown NRAS mutation: Unknown BRAF mutation: Unknown     Malignant neoplasm of female breast ([place])  05/29/2014 Initial Biopsy   Left breast needle core biopsy: Grade 2, DCIS with calcs. ER+ (100%), PR+ (96%).    06/04/2014 Initial Diagnosis   Left breast DCIS  with calcifications, ER 100%, PR 96%   06/10/2014  Breast MRI   Left breast: 2.4 x 1.3 x 1.1 cm area of patchy non-mass enhancement upper outer quadrant includes postbiopsy seroma; Right breast: 1.2 cm previously biopsied stable benign fibroadenoma   06/12/2014 Procedure   Genetic counseling/testing: Identified 1 VUS on CHEK2 gene. Remainder of 17 gene panel tested negative and included: ATM, BARD1, BRCA 1/2, BRIP1, CDH1, CHEK2, EPCAM, MLH1, MSH2, MSH6, NBN, NF1, PALB2, PTEN, RAD50, RAD51C, RAD51D, STK11, and TP53.    07/01/2014 Surgery   Left breast lumpectomy ([place]): Grade 1, DCIS, spanning 2.3 cm, 1 mm margin, ER 100%, PR 96%   07/31/2014 - 08/28/2014 Radiation Therapy   Adjuvant RT completed Pablo Ledger). Left breast: Total dose 42.5 Gy over 17 fractions. Left breast boost: Total dose 7.5 Gy over 3 fractions.    09/14/2014 - 01/13/2020 Anti-estrogen oral therapy   Anastrazole 43m daily. Planned duration of treatment: 5 years ([place]. Completed in 01/2020.    09/25/2014 Survivorship   Survivorship Care Plan given to patient and reviewed with her in person.    03/02/2021 Imaging   CT CAP  IMPRESSION: 1. No findings of active/recurrent malignancy. Partial right hemicolectomy. 2. Endometrial stripe remains mildly thickened, but endometrial biopsy in May was negative for malignancy. 3. Progressive endplate sclerosis and endplate irregularity at T2-3, probably due to degenerative endplate findings. If the has referable upper thoracic pain/symptoms then thoracic spine MRI could be used for further workup. 4. Other imaging findings of potential clinical significance: Mild cardiomegaly. Aortic Atherosclerosis (ICD10-I70.0). Mild mitral valve calcification. Postoperative findings in the left breast with adjacent radiation port anteriorly in the left upper lobe. Tiny pulmonary nodules in the left lower lobe are unchanged from earliest available comparison of 10/17/2019 and probably benign although may merit surveillance. Multilevel lumbar  impingement. Mild pelvic floor laxity.   Malignant neoplasm of ascending colon ([facility]  09/19/2019 Imaging   CT AP W contrast 09/19/19  IMPRESSION Fullness in the cecum, cannot exclude a mass. No evidence for metastatic disease is identified.    09/24/2019 Procedure   Colonoscopy by Dr MEber Jones5/25/21 IMPRESSION 1. The colon was redundant  2. Mild diverticulosis was noted through the entire examined colon 3. Single 167mpolyp was found in the ascending colon; polypectomy was performed using snare cautery and biopsy forceps 4. Mild diverticulosis was notes in the descending colon and sigmoid colon.  5. Single polyp was found in the sigmoid colon, polypectomy was performed with cold forceps.  6. Single polyp was found in the rectosigmoid colon; polypectomy was performed with cold snare  7. Small internal hemorrhoids  8. Large mass was found at the cecum; multiple biopsies of the area were performed using cold forceps; injection (tattooing) was performed distal to the mass.    09/24/2019 Initial Biopsy   INTERPRETATION AND DIAGNOSIS:  A. Cecum, biopsy:  Invasive moderately differentiated adenocarcinoma.  see comment  B. Polyp @ ascending colon, polypectomy:  Tubular Adenoma  C. Polyp @ sigmoid colon Polypectomy:  hyperplastic polyp.  D. Polyp @ rectosigmoid colon, Polypectomy:  Hyperplastic Polyp      10/16/2019 Imaging   CT Chest IMPRESSION: 1. Multiple small pulmonary nodules measuring 5 mm or less in size in the lungs. These are nonspecific and are typically considered statistically likely benign. However, given the patient's history of primary malignancy, close attention on follow-up studies is recommended to ensure stability. 2. Aortic atherosclerosis, in addition to right coronary artery disease. Assessment for potential risk factor modification, dietary  therapy or pharmacologic therapy may be warranted, if clinically indicated. 3. There are calcifications of the aortic valve  and mitral annulus. Echocardiographic correlation for evaluation of potential valvular dysfunction may be warranted if clinically indicated. 4. Small hiatal hernia.   Aortic Atherosclerosis (ICD10-I70.0).   11/01/2019 Initial Diagnosis   Colon cancer ([place])   11/01/2019 Surgery   LAPAROSCOPIC PARTIAL COLECTOMY by Dr Marcello Moores and Dr Johney Maine   11/01/2019 Pathology Results   FINAL MICROSCOPIC DIAGNOSIS:   A. COLON, PROXIMAL RIGHT, COLECTOMY:  - Invasive colonic adenocarcinoma, 5 cm.  - Tumor invades through the muscularis propria into pericolonic tissues.   - Margins of resection are not involved.  - Metastatic carcinoma in (5) of (13) lymph nodes.  - See oncology table.    MSI Stable  Mismatch repair normal  MLH1 - Preserved nuclear expression (greater 50% tumor expression) MSH2 - Preserved nuclear expression (greater 50% tumor expression) MSH6 - Preserved nuclear expression (greater 50% tumor expression) PMS2 - Preserved nuclear expression (greater 50% tumor expression)   11/01/2019 Cancer Staging   Staging form: Colon and Rectum, AJCC 8th Edition - Pathologic stage from 11/01/2019: Stage IIIB (pT3, pN2a, cM0) - Signed by Truitt Merle, MD on 11/29/2019   12/10/2019 Procedure   PAC placed 12/10/19   12/17/2019 - 06/01/2020 Chemotherapy   FOLFOX q2weeks starting in 2 weeks starting 12/17/19. Held 01/27/20-02/10/20 due to b/l PE. Oxaliplatin held C11-12 due to neuropathy. Completed on 06/01/20   03/02/2021 Imaging   CT CAP  IMPRESSION: 1. No findings of active/recurrent malignancy. Partial right hemicolectomy. 2. Endometrial stripe remains mildly thickened, but endometrial biopsy in May was negative for malignancy. 3. Progressive endplate sclerosis and endplate irregularity at T2-3, probably due to degenerative endplate findings. If the has referable upper thoracic pain/symptoms then thoracic spine MRI could be used for further workup. 4. Other imaging findings of potential clinical  significance: Mild cardiomegaly. Aortic Atherosclerosis (ICD10-I70.0). Mild mitral valve calcification. Postoperative findings in the left breast with adjacent radiation port anteriorly in the left upper lobe. Tiny pulmonary nodules in the left lower lobe are unchanged from earliest available comparison of 10/17/2019 and probably benign although may merit surveillance. Multilevel lumbar impingement. Mild pelvic floor laxity.   08/26/2021 Imaging   EXAM: CT CHEST, ABDOMEN, AND PELVIS WITH CONTRAST  IMPRESSION: 1. Stable examination without new or progressive findings to suggest local recurrence or metastatic disease within the chest, abdomen, or pelvis. 2. Hepatomegaly with hepatic steatosis. 3. Sigmoid colonic diverticulosis without findings of acute diverticulitis. 4. Similar prominent endplate sclerosis and irregularity at T2-T3 is most consistent with Modic type endplate degenerative changes. However, if patient has referable upper thoracic pain consider further workup with thoracic spine MRI. 5. Similar thickening of the endometrial stripe measuring up to 8 mm, which was previously biopsied with results negative for malignancy. 6. Aortic Atherosclerosis (ICD10-I70.0).   11/10/2021 PET scan   IMPRESSION: 1. LEFT ovary is increased in size and is intensely hypermetabolic. While physiologic hypermetabolic ovarian tissue is not uncommon, the enlargement and asymmetric activity warrants further evaluation. Consider contrast pelvic MRI vs tissue sampling. 2. No evidence of metastatic colorectal carcinoma otherwise. 3. Post RIGHT hemicolectomy anatomy. 4. Evidence of radiation change in the LEFT upper lobe (remote breast cancer).     12/22/2021 Relapse/Recurrence    FINAL MICROSCOPIC DIAGNOSIS:   A. LEFT OVARY AND FALLOPIAN TUBE, SALPINGO OOPHORECTOMY:  - Metastatic moderately differentiated colonic adenocarcinoma involving left ovary  - Focal ovarian stromal calcification  - Segment  of benign fallopian  tube   B. UTERUS WITH RIGHT FALLOPIAN TUBE AND OVARY, HYSTERECTOMY AND SALPINGO-OOPHORECTOMY:  - Metastatic moderately differentiated colonic adenocarcinoma involving right ovary  - Focal invasive extensive adenomyosis  - Benign endometrial polyps  - Benign proliferative phase endometrium  - Hydrosalpinx of right fallopian tube  - Focal ovarian stromal calcification   C. PERITONEAL DEPOSITS, ANTERIOR CUL DE SAC, BIOPSY:  - Metastatic mucinous adenocarcinoma, consistent with colorectal primary    COMMENT:  Immunohistochemical stains show that the tumor cells are positive for CK20 and CDX2 while they are negative for CK7 and PAX8, consistent with above interpretation.    01/24/2022 - 03/23/2022 Chemotherapy   Patient is on Treatment Plan : COLORECTAL FOLFOX q14d x 3 months     04/01/2022 Imaging   CT AP IMPRESSION: 1. New omental metastatic disease. 2. Vague hypoattenuating lesion in the inferior right hepatic lobe is new and also worrisome for metastatic disease. 3. Similar small right lower lobe nodules. Recommend continued attention on follow-up. 4. Steatotic enlarged liver. 5.  Aortic atherosclerosis (ICD10-I70.0).   04/06/2022 -  Chemotherapy   Patient is on Treatment Plan : COLORECTAL FOLFIRI + Bevacizumab q14d     04/21/2022 Imaging    IMPRESSION: 1. Stable chest CT. No evidence of metastatic disease. 2. Stable small pulmonary nodules, considered benign based on stability. 3. Stable postsurgical changes in the left breast and anterior left upper lobe radiation changes. 4. Aortic Atherosclerosis (ICD10-I70.0) and Emphysema (ICD10-J43.9).   04/21/2022 Imaging    IMPRESSION: 1. Small enhancing lesion inferiorly in the right hepatic lobe is typical of metastatic disease and stable from recent abdominal CT. No other definite liver lesions are identified. Mild hepatic steatosis. 2. No other significant abdominal findings. 3. Omental nodularity seen on  CT is not well visualized by MRI.      INTERVAL HISTORY:  Kim Castro is here for a follow up of  right colon cancer and h/o DCIS    She was last seen by me on 05/30/2022 She presents to the clinic alone. Pt states she is doing fine. Pt reports her appetite is doing better. Pt states that she does all her house hold chores at a slower pace, but it gets done. Pt does still go out for shopping. Since decrease in dose of chemo, pt states she is tolerating it better.    All other systems were reviewed with the patient and are negative.  MEDICAL HISTORY:  Past Medical History:  Diagnosis Date   Aortic atherosclerosis (HCC)    Arthritis    feet, lower back   Basal cell carcinoma    arm   Breast cancer of upper-outer quadrant of left female breast ([place]) 06/04/2014   Cataract    immature on the left   Colon cancer ([place]) 08/2019   Diabetes mellitus without complication (HCC)    Diverticulosis    Dizziness    > 56yr ago;took Antivert    Family history of anesthesia complication    sister slow to wake up with anesthesia   Family history of breast cancer    Family history of colon cancer    Family history of uterine cancer    GERD (gastroesophageal reflux disease)    takes occasional TUMs   History of bronchitis    > 89[place][age]   History of colon polyps    History of hiatal hernia    Small noted on CT   History of pulmonary embolus ([place])    Hypertension    takes Losartan  daily and HCTZ   Iron deficiency anemia    Joint pain    Numbness    to toes on each foot   Peripheral neuropathy    feet and toes   Personal history of radiation therapy    Pulmonary nodules    Noted on CT   Radiation 07/31/14-08/28/14   Left Breast 20 fxs   Seasonal allergies    takes Claritin prn   Urinary frequency    Vitamin D deficiency    takes VIt D daily    SURGICAL HISTORY: Past Surgical History:  Procedure Laterality Date   BREAST BIOPSY Bilateral    BREAST LUMPECTOMY Left     BREAST LUMPECTOMY WITH RADIOACTIVE SEED LOCALIZATION Left 07/01/2014   Procedure: LEFT BREAST LUMPECTOMY WITH RADIOACTIVE SEED LOCALIZATION;  Surgeon: Excell Seltzer, MD;  Location: [facility];  Service: General;  Laterality: Left;   CATARACT EXTRACTION Right    COLONOSCOPY  09/24/2019   [place]   LAPAROSCOPIC PARTIAL COLECTOMY N/A 11/01/2019   Procedure: LAPAROSCOPIC PARTIAL COLECTOMY;  Surgeon: Leighton Ruff, MD;  Location: [facility];  Service: General;  Laterality: N/A;   POLYPECTOMY     PORTACATH PLACEMENT N/A 12/10/2019   Procedure: INSERTION PORT-A-CATH ULTRASOUND GUIDED IN RIGHT IJ;  Surgeon: Leighton Ruff, MD;  Location: [facility];  Service: General;  Laterality: N/A;   ROBOTIC ASSISTED TOTAL HYSTERECTOMY Bilateral 12/22/2021   Procedure: XI ROBOTIC ASSISTED TOTAL HYSTERECTOMY WITH BILATERAL SALPINGO OOPHORECTOMY;  Surgeon: [name], MD;  Location: [facility];  Service: Gynecology;  Laterality: Bilateral;   TOTAL KNEE ARTHROPLASTY Left 10/24/2012   Procedure: TOTAL KNEE ARTHROPLASTY;  Surgeon: Kerin Salen, MD;  Location: [facility];  Service: Orthopedics;  Laterality: Left;   TOTAL KNEE ARTHROPLASTY Right 01/09/2013   Procedure: TOTAL KNEE ARTHROPLASTY;  Surgeon: Kerin Salen, MD;  Location: [facility];  Service: Orthopedics;  Laterality: Right;   TUBAL LIGATION      I have reviewed the social history and family history with the patient and they are unchanged from previous note.  ALLERGIES:  is allergic to oxycodone.  MEDICATIONS:  Current Outpatient Medications  Medication Sig Dispense Refill   acetaminophen (TYLENOL) 325 MG tablet Take 650 mg by mouth every 6 (six) hours as needed for moderate pain.     b complex vitamins capsule Take 1 capsule by mouth daily.     Cholecalciferol (VITAMIN D) 2000 UNITS CAPS Take 2,000 Units by mouth daily.      diphenoxylate-atropine (LOMOTIL) 2.5-0.025 MG tablet Take 1 tablet by mouth 4 (four) times daily as needed for diarrhea or  loose stools. 60 tablet 0   Ibuprofen 200 MG CAPS Take by mouth.     loratadine (CLARITIN) 10 MG tablet Take 10 mg by mouth daily as needed for allergies.     losartan (COZAAR) 25 MG tablet Take 1 tablet (25 mg total) by mouth daily. 90 tablet 3   metFORMIN (GLUCOPHAGE) 500 MG tablet Take 1 tablet (500 mg total) by mouth in the morning and at bedtime. 180 tablet 3   Multiple Vitamins-Minerals (MULTIVITAMIN WITH MINERALS) tablet Take 1 tablet by mouth daily.     omeprazole (PRILOSEC) 20 MG capsule Take 20 mg by mouth daily before breakfast.      potassium chloride (KLOR-CON M) 10 MEQ tablet Take 1 tablet (10 mEq total) by mouth daily. 30 tablet 1   pravastatin (PRAVACHOL) 10 MG tablet Take 1 tablet (10 mg total) by mouth daily. 90 tablet 3  pregabalin (LYRICA) 25 MG capsule Take 1 capsule (25 mg total) by mouth 2 (two) times daily. 60 capsule 3   No current facility-administered medications for this visit.    PHYSICAL EXAMINATION: ECOG PERFORMANCE STATUS: 1 - Symptomatic but completely ambulatory  Vitals:   06/13/22 1003  BP: (!) 159/70  Pulse: 71  Resp: 18  Temp: 98.6 F (37 C)  SpO2: 95%   Wt Readings from Last 3 Encounters:  06/13/22 222 lb 8 oz (100.9 kg)  05/30/22 217 lb 1.6 oz (98.5 kg)  05/16/22 225 lb 11.2 oz (102.4 kg)     GENERAL:alert, no distress and comfortable SKIN: skin color normal, no rashes or significant lesions EYES: normal, Conjunctiva are pink and non-injected, sclera clear  NEURO: alert & oriented x 3 with fluent speech    LABORATORY DATA:  I have reviewed the data as listed    Latest Ref Rng & Units 06/13/2022    9:06 AM 05/30/2022    8:47 AM 05/16/2022    9:48 AM  CBC  WBC 4.0 - 10.5 K/uL 10.6  10.0  9.8   Hemoglobin 12.0 - 15.0 g/dL 9.5  9.5  9.3   Hematocrit 36.0 - 46.0 % 29.5  29.0  28.8   Platelets 150 - 400 K/uL 184  163  163         Latest Ref Rng & Units 06/13/2022    9:06 AM 05/30/2022    8:47 AM 05/16/2022    9:48 AM  CMP   Glucose 70 - 99 mg/dL 139  144  119   BUN 8 - 23 mg/dL 14  15  16   $ Creatinine 0.44 - 1.00 mg/dL 0.79  0.78  0.95   Sodium 135 - 145 mmol/L 145  142  143   Potassium 3.5 - 5.1 mmol/L 3.4  3.3  4.1   Chloride 98 - 111 mmol/L 110  110  110   CO2 22 - 32 mmol/L 30  25  26   $ Calcium 8.9 - 10.3 mg/dL 8.4  8.9  9.2   Total Protein 6.5 - 8.1 g/dL 5.7  6.0  6.2   Total Bilirubin 0.3 - 1.2 mg/dL 0.7  0.9  1.3   Alkaline Phos 38 - 126 U/L 151  219  118   AST 15 - 41 U/L 23  17  20   $ ALT 0 - 44 U/L 15  14  13       $ RADIOGRAPHIC STUDIES: I have personally reviewed the radiological images as listed and agreed with the findings in the report. No results found.    Orders Placed This Encounter  Procedures   CBC with Differential ([facility] Only)    Standing Status:   Future    Standing Expiration Date:   07/12/2023   CMP ([facility] only)    Standing Status:   Future    Standing Expiration Date:   07/12/2023   Total Protein, Urine dipstick    Standing Status:   Future    Standing Expiration Date:   07/12/2023   CBC with Differential ([facility] Only)    Standing Status:   Future    Standing Expiration Date:   07/26/2023   CMP ([facility] only)    Standing Status:   Future    Standing Expiration Date:   07/26/2023   Total Protein, Urine dipstick    Standing Status:   Future    Standing Expiration Date:   07/26/2023   All questions  were answered. The patient knows to call the clinic with any problems, questions or concerns. No barriers to learning was detected. The total time spent in the appointment was 30 minutes.     Truitt Merle, MD 06/13/2022   Felicity Coyer, CMA, am acting as scribe for Truitt Merle, MD.   I have reviewed the above documentation for accuracy and completeness, and I agree with the above.

## 2022-06-14 ENCOUNTER — Other Ambulatory Visit: Payer: Self-pay | Admitting: Hematology

## 2022-06-15 ENCOUNTER — Other Ambulatory Visit: Payer: Self-pay

## 2022-06-15 ENCOUNTER — Inpatient Hospital Stay: Payer: Medicare Other

## 2022-06-15 VITALS — BP 146/77 | HR 64 | Temp 98.6°F | Resp 18

## 2022-06-15 DIAGNOSIS — C7963 Secondary malignant neoplasm of bilateral ovaries: Secondary | ICD-10-CM | POA: Diagnosis not present

## 2022-06-15 DIAGNOSIS — C182 Malignant neoplasm of ascending colon: Secondary | ICD-10-CM | POA: Diagnosis not present

## 2022-06-15 DIAGNOSIS — Z5111 Encounter for antineoplastic chemotherapy: Secondary | ICD-10-CM | POA: Diagnosis not present

## 2022-06-15 DIAGNOSIS — Z853 Personal history of malignant neoplasm of breast: Secondary | ICD-10-CM | POA: Diagnosis not present

## 2022-06-15 DIAGNOSIS — Z5189 Encounter for other specified aftercare: Secondary | ICD-10-CM | POA: Diagnosis not present

## 2022-06-15 DIAGNOSIS — I10 Essential (primary) hypertension: Secondary | ICD-10-CM | POA: Diagnosis not present

## 2022-06-15 DIAGNOSIS — G620 Drug-induced polyneuropathy: Secondary | ICD-10-CM | POA: Diagnosis not present

## 2022-06-15 DIAGNOSIS — E1136 Type 2 diabetes mellitus with diabetic cataract: Secondary | ICD-10-CM | POA: Diagnosis not present

## 2022-06-15 DIAGNOSIS — C786 Secondary malignant neoplasm of retroperitoneum and peritoneum: Secondary | ICD-10-CM | POA: Diagnosis not present

## 2022-06-15 DIAGNOSIS — E1142 Type 2 diabetes mellitus with diabetic polyneuropathy: Secondary | ICD-10-CM | POA: Diagnosis not present

## 2022-06-15 MED ORDER — SODIUM CHLORIDE 0.9% FLUSH
10.0000 mL | INTRAVENOUS | Status: DC | PRN
  Administered 2022-06-15: 10 mL

## 2022-06-15 MED ORDER — HEPARIN SOD (PORK) LOCK FLUSH 100 UNIT/ML IV SOLN
500.0000 [IU] | Freq: Once | INTRAVENOUS | Status: AC | PRN
  Administered 2022-06-15: 500 [IU]

## 2022-06-15 MED ORDER — PEGFILGRASTIM-CBQV 6 MG/0.6ML ~~LOC~~ SOSY
6.0000 mg | PREFILLED_SYRINGE | Freq: Once | SUBCUTANEOUS | Status: AC
  Administered 2022-06-15: 6 mg via SUBCUTANEOUS
  Filled 2022-06-15: qty 0.6

## 2022-06-15 NOTE — Patient Instructions (Signed)

## 2022-06-23 ENCOUNTER — Ambulatory Visit (HOSPITAL_COMMUNITY): Payer: Medicare Other

## 2022-06-23 ENCOUNTER — Ambulatory Visit (HOSPITAL_BASED_OUTPATIENT_CLINIC_OR_DEPARTMENT_OTHER)
Admission: RE | Admit: 2022-06-23 | Discharge: 2022-06-23 | Disposition: A | Discharge status: Home / Self Care | Payer: Medicare Other | Source: Ambulatory Visit | Attending: Hematology | Admitting: Hematology

## 2022-06-23 DIAGNOSIS — Z170 Estrogen receptor positive status [ER+]: Secondary | ICD-10-CM | POA: Insufficient documentation

## 2022-06-23 DIAGNOSIS — R918 Other nonspecific abnormal finding of lung field: Secondary | ICD-10-CM | POA: Diagnosis not present

## 2022-06-23 DIAGNOSIS — C50919 Malignant neoplasm of unspecified site of unspecified female breast: Secondary | ICD-10-CM | POA: Insufficient documentation

## 2022-06-23 DIAGNOSIS — C182 Malignant neoplasm of ascending colon: Secondary | ICD-10-CM | POA: Insufficient documentation

## 2022-06-23 DIAGNOSIS — C189 Malignant neoplasm of colon, unspecified: Secondary | ICD-10-CM | POA: Diagnosis not present

## 2022-06-23 DIAGNOSIS — N281 Cyst of kidney, acquired: Secondary | ICD-10-CM | POA: Diagnosis not present

## 2022-06-23 DIAGNOSIS — K769 Liver disease, unspecified: Secondary | ICD-10-CM | POA: Diagnosis not present

## 2022-06-23 MED ORDER — IOHEXOL 300 MG/ML  SOLN
100.0000 mL | Freq: Once | INTRAMUSCULAR | Status: AC | PRN
  Administered 2022-06-23: 100 mL via INTRAVENOUS

## 2022-06-24 MED FILL — Dexamethasone Sodium Phosphate Inj 100 MG/10ML: INTRAMUSCULAR | Qty: 1 | Status: AC

## 2022-06-24 NOTE — Progress Notes (Unsigned)
[facility]   [phone] [phone]   Clinic Follow up Note   Patient Care Team: [name], MD as PCP - General (Family Medicine) Rolm Bookbinder, MD as Consulting Physician (General Surgery) Truitt Merle, MD as Consulting Physician (Hematology) Leighton Ruff, MD as Consulting Physician (General Surgery) Nicholas Lose, MD as Consulting Physician (Hematology and Oncology)  Date of Service:  06/27/2022  CHIEF COMPLAINT: f/u of right colon cancer and h/o DCIS     CURRENT THERAPY: Second line FOLFIRI/beva started on 04/04/22     ASSESSMENT:  [name] is a 77 y.o. female with   Malignant neoplasm of ascending colon ([place]) pT3N2aM0 stage IIB, MSS, metastatic to b/l ovaries and peritoneum and liver, KRAS mutation G12V (+)  -Initially diagnosed in 08/2019, s/p resection with Dr Marcello Moores on 11/01/19. Path showed overall Stage IIIB cancer.  -s/p 6 months adjuvant FOLFOX, completed 06/01/20, oxaliplatin held for final 2 cycles due to neuropathy.  -s/p BSO and hysterectomy on 12/22/21 with Dr. Berline Lopes. Path revealed metastatic moderately differentiated colonic adenocarcinoma involving both ovaries and peritoneum -she restarted FOLFOX on 01/24/22. -Due to disease progression, chemotherapy changed to second line FOLFIRI and bevacizumab on April 06, 2022. She has been tolerating moderately well, better now with dose reduction  -Restaging CT scan from June 23, 2022 showed partial response in liver and peritoneal metastasis, no other new lesions.  I reviewed the images and discussed the results with patient.  She is pleased. -She has been tolerating chemotherapy much better lately due to the dose reduction.  We discussed maintenance therapy option in future   Peripheral neuropathy due to chemotherapy ([place]) -Secondary to oxaliplatin -She is on Lyrica and B complex, continue to monitor.  Overall stable.    Malignant neoplasm of female breast  [facility]) -Dx in 05/2014, s/p left lumpectomy with Dr Excell Seltzer, adjuvant RT with Dr Pablo Ledger, and Anastrozole 08/2014 - 01/13/20 under Dr Lindi Adie.  -DEXA 09/11/20 T score +1.2 normal.  -most recent mammogram on 10/04/21 was negative. Will continue yearly     PLAN: -lab reviewed -discuss CT scan -partial response  -Tumor marker -decrease in numbers. -proceed C8 FOLFIRI same reduce dose  -lab,flush,f/u FOLFIRI 07/11/2022  SUMMARY OF ONCOLOGIC HISTORY: Oncology History Overview Note   Cancer Staging  Malignant neoplasm of female breast [facility]) Staging form: Breast, AJCC 7th Edition - Clinical stage from 06/11/2014: Stage 0 (Tis (DCIS), N0, M0) - Unsigned Staged by: Pathologist and managing physician Laterality: Left Estrogen receptor status: Positive Progesterone receptor status: Positive Stage used in treatment planning: Yes National guidelines used in treatment planning: Yes Type of national guideline used in treatment planning: NCCN - Pathologic stage from 07/03/2014: Stage Unknown (Tis (DCIS), NX, cM0) - Signed by Enid Cutter, MD on 07/10/2014 Staged by: Pathologist Laterality: Left Estrogen receptor status: Positive Progesterone receptor status: Positive Stage used in treatment planning: Yes National guidelines used in treatment planning: Yes Type of national guideline used in treatment planning: NCCN Staging comments: Staged on final lumpectomy specimen by Dr. Donato Heinz.  Right colon cancer Staging form: Colon and Rectum, AJCC 8th Edition - Pathologic stage from 11/01/2019: Stage IIIB (pT3, pN2a, cM0) - Signed by Truitt Merle, MD on 11/29/2019 Stage prefix: Initial diagnosis Histologic grading system: 4 grade system Histologic grade (G): G2 Residual tumor (R): R0 - None Tumor deposits (TD): Absent Perineural invasion (PNI): Absent Microsatellite instability (MSI): Stable KRAS mutation: Unknown NRAS mutation: Unknown BRAF mutation: Unknown     Malignant neoplasm of female breast ([address])  05/29/2014 Initial Biopsy   Left breast needle core biopsy: Grade 2, DCIS with calcs. ER+ (100%), PR+ (96%).    06/04/2014 Initial Diagnosis   Left breast DCIS with calcifications, ER 100%, PR 96%   06/10/2014 Breast MRI   Left breast: 2.4 x 1.3 x 1.1 cm area of patchy non-mass enhancement upper outer quadrant includes postbiopsy seroma; Right breast: 1.2 cm previously biopsied stable benign fibroadenoma   06/12/2014 Procedure   Genetic counseling/testing: Identified 1 VUS on CHEK2 gene. Remainder of 17 gene panel tested negative and included: ATM, BARD1, BRCA 1/2, BRIP1, CDH1, CHEK2, EPCAM, MLH1, MSH2, MSH6, NBN, NF1, PALB2, PTEN, RAD50, RAD51C, RAD51D, STK11, and TP53.    07/01/2014 Surgery   Left breast lumpectomy ([place]): Grade 1, DCIS, spanning 2.3 cm, 1 mm margin, ER 100%, PR 96%   07/31/2014 - 08/28/2014 Radiation Therapy   Adjuvant RT completed Pablo Ledger). Left breast: Total dose 42.5 Gy over 17 fractions. Left breast boost: Total dose 7.5 Gy over 3 fractions.    09/14/2014 - 01/13/2020 Anti-estrogen oral therapy   Anastrazole '1mg'$  daily. Planned duration of treatment: 5 years [place]). Completed in 01/2020.    09/25/2014 Survivorship   Survivorship Care Plan given to patient and reviewed with her in person.    03/02/2021 Imaging   CT CAP  IMPRESSION: 1. No findings of active/recurrent malignancy. Partial right hemicolectomy. 2. Endometrial stripe remains mildly thickened, but endometrial biopsy in May was negative for malignancy. 3. Progressive endplate sclerosis and endplate irregularity at T2-3, probably due to degenerative endplate findings. If the has referable upper thoracic pain/symptoms then thoracic spine MRI could be used for further workup. 4. Other imaging findings of potential clinical significance: Mild cardiomegaly. Aortic Atherosclerosis (ICD10-I70.0). Mild mitral valve calcification. Postoperative findings in the left breast with adjacent radiation port anteriorly  in the left upper lobe. Tiny pulmonary nodules in the left lower lobe are unchanged from earliest available comparison of 10/17/2019 and probably benign although may merit surveillance. Multilevel lumbar impingement. Mild pelvic floor laxity.   Malignant neoplasm of ascending colon ([place])  09/19/2019 Imaging   CT AP W contrast 09/19/19  IMPRESSION Fullness in the cecum, cannot exclude a mass. No evidence for metastatic disease is identified.    09/24/2019 Procedure   Colonoscopy by Dr Eber Jones 09/24/19 IMPRESSION 1. The colon was redundant  2. Mild diverticulosis was noted through the entire examined colon 3. Single 18m polyp was found in the ascending colon; polypectomy was performed using snare cautery and biopsy forceps 4. Mild diverticulosis was notes in the descending colon and sigmoid colon.  5. Single polyp was found in the sigmoid colon, polypectomy was performed with cold forceps.  6. Single polyp was found in the rectosigmoid colon; polypectomy was performed with cold snare  7. Small internal hemorrhoids  8. Large mass was found at the cecum; multiple biopsies of the area were performed using cold forceps; injection (tattooing) was performed distal to the mass.    09/24/2019 Initial Biopsy   INTERPRETATION AND DIAGNOSIS:  A. Cecum, biopsy:  Invasive moderately differentiated adenocarcinoma.  see comment  B. Polyp @ ascending colon, polypectomy:  Tubular Adenoma  C. Polyp @ sigmoid colon Polypectomy:  hyperplastic polyp.  D. Polyp @ rectosigmoid colon, Polypectomy:  Hyperplastic Polyp      10/16/2019 Imaging   CT Chest IMPRESSION: 1. Multiple small pulmonary nodules measuring 5 mm or less in size in the lungs. These are nonspecific and are typically considered statistically likely benign. However, given the patient's history of  primary malignancy, close attention on follow-up studies is recommended to ensure stability. 2. Aortic atherosclerosis, in addition to right coronary  artery disease. Assessment for potential risk factor modification, dietary therapy or pharmacologic therapy may be warranted, if clinically indicated. 3. There are calcifications of the aortic valve and mitral annulus. Echocardiographic correlation for evaluation of potential valvular dysfunction may be warranted if clinically indicated. 4. Small hiatal hernia.   Aortic Atherosclerosis (ICD10-I70.0).   11/01/2019 Initial Diagnosis   Colon cancer ([place])   11/01/2019 Surgery   LAPAROSCOPIC PARTIAL COLECTOMY by Dr Marcello Moores and Dr Johney Maine   11/01/2019 Pathology Results   FINAL MICROSCOPIC DIAGNOSIS:   A. COLON, PROXIMAL RIGHT, COLECTOMY:  - Invasive colonic adenocarcinoma, 5 cm.  - Tumor invades through the muscularis propria into pericolonic tissues.   - Margins of resection are not involved.  - Metastatic carcinoma in (5) of (13) lymph nodes.  - See oncology table.    MSI Stable  Mismatch repair normal  MLH1 - Preserved nuclear expression (greater 50% tumor expression) MSH2 - Preserved nuclear expression (greater 50% tumor expression) MSH6 - Preserved nuclear expression (greater 50% tumor expression) PMS2 - Preserved nuclear expression (greater 50% tumor expression)   11/01/2019 Cancer Staging   Staging form: Colon and Rectum, AJCC 8th Edition - Pathologic stage from 11/01/2019: Stage IIIB (pT3, pN2a, cM0) - Signed by Truitt Merle, MD on 11/29/2019   12/10/2019 Procedure   PAC placed 12/10/19   12/17/2019 - 06/01/2020 Chemotherapy   FOLFOX q2weeks starting in 2 weeks starting 12/17/19. Held 01/27/20-02/10/20 due to b/l PE. Oxaliplatin held C11-12 due to neuropathy. Completed on 06/01/20   03/02/2021 Imaging   CT CAP  IMPRESSION: 1. No findings of active/recurrent malignancy. Partial right hemicolectomy. 2. Endometrial stripe remains mildly thickened, but endometrial biopsy in May was negative for malignancy. 3. Progressive endplate sclerosis and endplate irregularity at T2-3, probably due  to degenerative endplate findings. If the has referable upper thoracic pain/symptoms then thoracic spine MRI could be used for further workup. 4. Other imaging findings of potential clinical significance: Mild cardiomegaly. Aortic Atherosclerosis (ICD10-I70.0). Mild mitral valve calcification. Postoperative findings in the left breast with adjacent radiation port anteriorly in the left upper lobe. Tiny pulmonary nodules in the left lower lobe are unchanged from earliest available comparison of 10/17/2019 and probably benign although may merit surveillance. Multilevel lumbar impingement. Mild pelvic floor laxity.   08/26/2021 Imaging   EXAM: CT CHEST, ABDOMEN, AND PELVIS WITH CONTRAST  IMPRESSION: 1. Stable examination without new or progressive findings to suggest local recurrence or metastatic disease within the chest, abdomen, or pelvis. 2. Hepatomegaly with hepatic steatosis. 3. Sigmoid colonic diverticulosis without findings of acute diverticulitis. 4. Similar prominent endplate sclerosis and irregularity at T2-T3 is most consistent with Modic type endplate degenerative changes. However, if patient has referable upper thoracic pain consider further workup with thoracic spine MRI. 5. Similar thickening of the endometrial stripe measuring up to 8 mm, which was previously biopsied with results negative for malignancy. 6. Aortic Atherosclerosis (ICD10-I70.0).   11/10/2021 PET scan   IMPRESSION: 1. LEFT ovary is increased in size and is intensely hypermetabolic. While physiologic hypermetabolic ovarian tissue is not uncommon, the enlargement and asymmetric activity warrants further evaluation. Consider contrast pelvic MRI vs tissue sampling. 2. No evidence of metastatic colorectal carcinoma otherwise. 3. Post RIGHT hemicolectomy anatomy. 4. Evidence of radiation change in the LEFT upper lobe (remote breast cancer).     12/22/2021 Relapse/Recurrence    FINAL MICROSCOPIC DIAGNOSIS:    A.  LEFT OVARY AND FALLOPIAN TUBE, SALPINGO OOPHORECTOMY:  - Metastatic moderately differentiated colonic adenocarcinoma involving left ovary  - Focal ovarian stromal calcification  - Segment of benign fallopian tube   B. UTERUS WITH RIGHT FALLOPIAN TUBE AND OVARY, HYSTERECTOMY AND SALPINGO-OOPHORECTOMY:  - Metastatic moderately differentiated colonic adenocarcinoma involving right ovary  - Focal invasive extensive adenomyosis  - Benign endometrial polyps  - Benign proliferative phase endometrium  - Hydrosalpinx of right fallopian tube  - Focal ovarian stromal calcification   C. PERITONEAL DEPOSITS, ANTERIOR CUL DE SAC, BIOPSY:  - Metastatic mucinous adenocarcinoma, consistent with colorectal primary    COMMENT:  Immunohistochemical stains show that the tumor cells are positive for CK20 and CDX2 while they are negative for CK7 and PAX8, consistent with above interpretation.    01/24/2022 - 03/23/2022 Chemotherapy   Patient is on Treatment Plan : COLORECTAL FOLFOX q14d x 3 months     04/01/2022 Imaging   CT AP IMPRESSION: 1. New omental metastatic disease. 2. Vague hypoattenuating lesion in the inferior right hepatic lobe is new and also worrisome for metastatic disease. 3. Similar small right lower lobe nodules. Recommend continued attention on follow-up. 4. Steatotic enlarged liver. 5.  Aortic atherosclerosis (ICD10-I70.0).   04/06/2022 -  Chemotherapy   Patient is on Treatment Plan : COLORECTAL FOLFIRI + Bevacizumab q14d     04/21/2022 Imaging    IMPRESSION: 1. Stable chest CT. No evidence of metastatic disease. 2. Stable small pulmonary nodules, considered benign based on stability. 3. Stable postsurgical changes in the left breast and anterior left upper lobe radiation changes. 4. Aortic Atherosclerosis (ICD10-I70.0) and Emphysema (ICD10-J43.9).   04/21/2022 Imaging    IMPRESSION: 1. Small enhancing lesion inferiorly in the right hepatic lobe is typical of  metastatic disease and stable from recent abdominal CT. No other definite liver lesions are identified. Mild hepatic steatosis. 2. No other significant abdominal findings. 3. Omental nodularity seen on CT is not well visualized by MRI.    Imaging     06/23/2022 Imaging    IMPRESSION: 1. Hypodense lesion of the inferior right lobe of the liver, hepatic segment VI is diminished in size, consistent with treatment response. 2. Tiny peritoneal and omental nodules identified by prior examination are diminished in size, consistent with treatment response. 3. Multiple tiny pulmonary nodules unchanged, most likely benign and incidental, however continued attention on follow-up warranted in the setting of known metastatic disease. 4. No evidence of new metastatic disease in the chest, abdomen, or pelvis. 5. Status post partial right hemicolectomy and reanastomosis. 6. Diffuse mosaic attenuation of the airspaces, consistent with small airways disease. 7. Hepatomegaly.      INTERVAL HISTORY:  Kim Castro is here for a follow up of right colon cancer and h/o DCIS    She was last seen by me on 06/13/2022 She presents to the clinic alone. Pt denies having any diarrhea. Pt state she goes to the bathroom  at least 3 time a day. Pt hadn't had to take imodium. Pt denies having fatigue. Pt does laundry and sweeping and mopping. Pt carpools her grand kids to there activities. Pt states  her treatment is manageable.   All other systems were reviewed with the patient and are negative.  MEDICAL HISTORY:  Past Medical History:  Diagnosis Date   Aortic atherosclerosis (H[facility])    Arthritis    feet, lower back   Basal cell carcinoma    arm   Breast cancer of upper-outer quadrant of left female breast ([place])  06/04/2014   Cataract    immature on the left   Colon cancer ([place]) 08/2019   Diabetes mellitus without complication ([place])    Diverticulosis    Dizziness    > 66yr ago;took Antivert     Family history of anesthesia complication    sister slow to wake up with anesthesia   Family history of breast cancer    Family history of colon cancer    Family history of uterine cancer    GERD (gastroesophageal reflux disease)    takes occasional TUMs   History of bronchitis    > 89[place][age]   History of colon polyps    History of hiatal hernia    Small noted on CT   History of pulmonary embolus ([place])    Hypertension    takes Losartan daily and HCTZ   Iron deficiency anemia    Joint pain    Numbness    to toes on each foot   Peripheral neuropathy    feet and toes   Personal history of radiation therapy    Pulmonary nodules    Noted on CT   Radiation 07/31/14-08/28/14   Left Breast 20 fxs   Seasonal allergies    takes Claritin prn   Urinary frequency    Vitamin D deficiency    takes VIt D daily    SURGICAL HISTORY: Past Surgical History:  Procedure Laterality Date   BREAST BIOPSY Bilateral    BREAST LUMPECTOMY Left    BREAST LUMPECTOMY WITH RADIOACTIVE SEED LOCALIZATION Left 07/01/2014   Procedure: LEFT BREAST LUMPECTOMY WITH RADIOACTIVE SEED LOCALIZATION;  Surgeon: BeExcell SeltzerMD;  [facility] Service: General;  Laterality: Left;   CATARACT EXTRACTION Right    COLONOSCOPY  09/24/2019   [place]   LAPAROSCOPIC PARTIAL COLECTOMY N/A 11/01/2019   Procedure: LAPAROSCOPIC PARTIAL COLECTOMY;  Surgeon: ThLeighton RuffMD;  Location: [facility];  Service: General;  Laterality: N/A;   POLYPECTOMY     PORTACATH PLACEMENT N/A 12/10/2019   Procedure: INSERTION PORT-A-CATH ULTRASOUND GUIDED IN RIGHT IJ;  Surgeon: ThLeighton RuffMD;  Location: [facility];  Service: General;  Laterality: N/A;   ROBOTIC ASSISTED TOTAL HYSTERECTOMY Bilateral 12/22/2021   Procedure: XI ROBOTIC ASSISTED TOTAL HYSTERECTOMY WITH BILATERAL SALPINGO OOPHORECTOMY;  Surgeon: TuLafonda MossesMD;  Location: [facility];  Service: Gynecology;  Laterality: Bilateral;   TOTAL KNEE  ARTHROPLASTY Left 10/24/2012   Procedure: TOTAL KNEE ARTHROPLASTY;  Surgeon: FrKerin SalenMD;  [facility]: [facility];  Laterality: Left;   TOTAL KNEE ARTHROPLASTY Right 01/09/2013   Procedure: TOTAL KNEE ARTHROPLASTY;  Surgeon: FrKerin SalenMD;  [facility] Service: Orthopedics;  Laterality: Right;   TUBAL LIGATION      I have reviewed the social history and family history with the patient and they are unchanged from previous note.  ALLERGIES:  is allergic to oxycodone.  MEDICATIONS:  Current Outpatient Medications  Medication Sig Dispense Refill   acetaminophen (TYLENOL) 325 MG tablet Take 650 mg by mouth every 6 (six) hours as needed for moderate pain.     b complex vitamins capsule Take 1 capsule by mouth daily.     Cholecalciferol (VITAMIN D) 2000 UNITS CAPS Take 2,000 Units by mouth daily.      diphenoxylate-atropine (LOMOTIL) 2.5-0.025 MG tablet Take 1 tablet by mouth 4 (four) times daily as needed for diarrhea or loose stools. 60 tablet 0   Ibuprofen 200 MG CAPS Take by mouth.  loratadine (CLARITIN) 10 MG tablet Take 10 mg by mouth daily as needed for allergies.     losartan (COZAAR) 25 MG tablet Take 1 tablet (25 mg total) by mouth daily. 90 tablet 3   metFORMIN (GLUCOPHAGE) 500 MG tablet Take 1 tablet (500 mg total) by mouth in the morning and at bedtime. 180 tablet 3   Multiple Vitamins-Minerals (MULTIVITAMIN WITH MINERALS) tablet Take 1 tablet by mouth daily.     omeprazole (PRILOSEC) 20 MG capsule Take 20 mg by mouth daily before breakfast.      potassium chloride (KLOR-CON) 10 MEQ tablet Take 1 tablet by mouth once daily 30 tablet 0   pravastatin (PRAVACHOL) 10 MG tablet Take 1 tablet (10 mg total) by mouth daily. 90 tablet 3   pregabalin (LYRICA) 25 MG capsule Take 1 capsule (25 mg total) by mouth 2 (two) times daily. 60 capsule 3   No current facility-administered medications for this visit.    PHYSICAL EXAMINATION: ECOG PERFORMANCE STATUS:  1 - Symptomatic but completely ambulatory  Vitals:   06/27/22 0923  BP: (!) 163/59  Pulse: 72  Resp: 18  Temp: 98.7 F (37.1 C)  SpO2: 99%   Wt Readings from Last 3 Encounters:  06/27/22 221 lb 9.6 oz (100.5 kg)  06/13/22 222 lb 8 oz (100.9 kg)  05/30/22 217 lb 1.6 oz (98.5 kg)    Lung: clear, no rhonchi or wheezes HEART: regular rate & rhythm and no murmurs and (+) b/l lower extremity edema, symmetric   LABORATORY DATA:  I have reviewed the data as listed    Latest Ref Rng & Units 06/27/2022    8:54 AM 06/13/2022    9:06 AM 05/30/2022    8:47 AM  CBC  WBC 4.0 - 10.5 K/uL 11.4  10.6  10.0   Hemoglobin 12.0 - 15.0 g/dL 9.5  9.5  9.5   Hematocrit 36.0 - 46.0 % 30.7  29.5  29.0   Platelets 150 - 400 K/uL 235  184  163         Latest Ref Rng & Units 06/27/2022    8:54 AM 06/13/2022    9:06 AM 05/30/2022    8:47 AM  CMP  Glucose 70 - 99 mg/dL 118  139  144   BUN 8 - 23 mg/dL '12  14  15   '$ Creatinine 0.44 - 1.00 mg/dL 0.61  0.79  0.78   Sodium 135 - 145 mmol/L 145  145  142   Potassium 3.5 - 5.1 mmol/L 3.5  3.4  3.3   Chloride 98 - 111 mmol/L 109  110  110   CO2 22 - 32 mmol/L '29  30  25   '$ Calcium 8.9 - 10.3 mg/dL 8.4  8.4  8.9   Total Protein 6.5 - 8.1 g/dL 6.0  5.7  6.0   Total Bilirubin 0.3 - 1.2 mg/dL 0.6  0.7  0.9   Alkaline Phos 38 - 126 U/L 138  151  219   AST 15 - 41 U/L '20  23  17   '$ ALT 0 - 44 U/L '13  15  14       '$ RADIOGRAPHIC STUDIES: I have personally reviewed the radiological images as listed and agreed with the findings in the report. No results found.    Orders Placed This Encounter  Procedures   CBC with Differential ([place] Only)    Standing Status:   Future    Standing Expiration Date:   08/09/2023  CMP ([place] only)    Standing Status:   Future    Standing Expiration Date:   08/09/2023   Total Protein, Urine dipstick    Standing Status:   Future    Standing Expiration Date:   08/09/2023   CBC with Differential ([place]  Only)    Standing Status:   Future    Standing Expiration Date:   08/23/2023   CMP ([place] only)    Standing Status:   Future    Standing Expiration Date:   08/23/2023   Total Protein, Urine dipstick    Standing Status:   Future    Standing Expiration Date:   08/23/2023   All questions were answered. The patient knows to call the clinic with any problems, questions or concerns. No barriers to learning was detected. The total time spent in the appointment was 30 minutes.     Truitt Merle, MD 06/27/2022   Felicity Coyer, CMA, am acting as scribe for Truitt Merle, MD.   I have reviewed the above documentation for accuracy and completeness, and I agree with the above.

## 2022-06-26 NOTE — Assessment & Plan Note (Signed)
pT3N2aM0 stage IIB, MSS, metastatic to b/l ovaries and peritoneum and liver, KRAS mutation G12V (+)  -Initially diagnosed in 08/2019, s/p resection with Dr Marcello Moores on 11/01/19. Path showed overall Stage IIIB cancer.  -s/p 6 months adjuvant FOLFOX, completed 06/01/20, oxaliplatin held for final 2 cycles due to neuropathy.  -s/p BSO and hysterectomy on 12/22/21 with Dr. Berline Lopes. Path revealed metastatic moderately differentiated colonic adenocarcinoma involving both ovaries and peritoneum -she restarted FOLFOX on 01/24/22. -Due to disease progression, chemotherapy changed to second line FOLFIRI and bevacizumab on April 06, 2022. She has been tolerating moderately well, better now with dose reduction  -Restaging CT scan from June 23, 2022 showed partial response in liver and peritoneal metastasis, no other new lesions.

## 2022-06-26 NOTE — Assessment & Plan Note (Signed)
-  Dx in 05/2014, s/p left lumpectomy with Dr Excell Seltzer, adjuvant RT with Dr Pablo Ledger, and Anastrozole 08/2014 - 01/13/20 under Dr Lindi Adie.  -DEXA 09/11/20 T score +1.2 normal.  -most recent mammogram on 10/04/21 was negative. Will continue yearly

## 2022-06-26 NOTE — Assessment & Plan Note (Signed)
-  Secondary to oxaliplatin -She is on Lyrica and B complex, continue to monitor.  Overall stable.

## 2022-06-27 ENCOUNTER — Other Ambulatory Visit: Payer: Self-pay

## 2022-06-27 ENCOUNTER — Inpatient Hospital Stay: Payer: Medicare Other

## 2022-06-27 ENCOUNTER — Inpatient Hospital Stay (HOSPITAL_BASED_OUTPATIENT_CLINIC_OR_DEPARTMENT_OTHER): Payer: Medicare Other | Admitting: Hematology

## 2022-06-27 ENCOUNTER — Encounter: Payer: Self-pay | Admitting: Hematology

## 2022-06-27 VITALS — BP 163/59 | HR 72 | Temp 98.7°F | Resp 18 | Ht 65.0 in | Wt 221.6 lb

## 2022-06-27 VITALS — BP 160/71 | HR 65 | Temp 98.2°F | Resp 16

## 2022-06-27 DIAGNOSIS — C50919 Malignant neoplasm of unspecified site of unspecified female breast: Secondary | ICD-10-CM

## 2022-06-27 DIAGNOSIS — C7963 Secondary malignant neoplasm of bilateral ovaries: Secondary | ICD-10-CM | POA: Diagnosis not present

## 2022-06-27 DIAGNOSIS — C182 Malignant neoplasm of ascending colon: Secondary | ICD-10-CM

## 2022-06-27 DIAGNOSIS — Z853 Personal history of malignant neoplasm of breast: Secondary | ICD-10-CM | POA: Diagnosis not present

## 2022-06-27 DIAGNOSIS — C786 Secondary malignant neoplasm of retroperitoneum and peritoneum: Secondary | ICD-10-CM | POA: Diagnosis not present

## 2022-06-27 DIAGNOSIS — E1136 Type 2 diabetes mellitus with diabetic cataract: Secondary | ICD-10-CM | POA: Diagnosis not present

## 2022-06-27 DIAGNOSIS — Z803 Family history of malignant neoplasm of breast: Secondary | ICD-10-CM

## 2022-06-27 DIAGNOSIS — T451X5A Adverse effect of antineoplastic and immunosuppressive drugs, initial encounter: Secondary | ICD-10-CM

## 2022-06-27 DIAGNOSIS — Z800 Family history of malignant neoplasm of digestive organs: Secondary | ICD-10-CM

## 2022-06-27 DIAGNOSIS — E1142 Type 2 diabetes mellitus with diabetic polyneuropathy: Secondary | ICD-10-CM | POA: Diagnosis not present

## 2022-06-27 DIAGNOSIS — Z5189 Encounter for other specified aftercare: Secondary | ICD-10-CM | POA: Diagnosis not present

## 2022-06-27 DIAGNOSIS — Z95828 Presence of other vascular implants and grafts: Secondary | ICD-10-CM

## 2022-06-27 DIAGNOSIS — G620 Drug-induced polyneuropathy: Secondary | ICD-10-CM

## 2022-06-27 DIAGNOSIS — C787 Secondary malignant neoplasm of liver and intrahepatic bile duct: Secondary | ICD-10-CM | POA: Diagnosis not present

## 2022-06-27 DIAGNOSIS — I10 Essential (primary) hypertension: Secondary | ICD-10-CM | POA: Diagnosis not present

## 2022-06-27 DIAGNOSIS — E119 Type 2 diabetes mellitus without complications: Secondary | ICD-10-CM

## 2022-06-27 DIAGNOSIS — Z8049 Family history of malignant neoplasm of other genital organs: Secondary | ICD-10-CM

## 2022-06-27 DIAGNOSIS — Z5111 Encounter for antineoplastic chemotherapy: Secondary | ICD-10-CM | POA: Diagnosis not present

## 2022-06-27 LAB — CMP (CANCER CENTER ONLY)
ALT: 13 U/L (ref 0–44)
AST: 20 U/L (ref 15–41)
Albumin: 3.5 g/dL (ref 3.5–5.0)
Alkaline Phosphatase: 138 U/L — ABNORMAL HIGH (ref 38–126)
Anion gap: 7 (ref 5–15)
BUN: 12 mg/dL (ref 8–23)
CO2: 29 mmol/L (ref 22–32)
Calcium: 8.4 mg/dL — ABNORMAL LOW (ref 8.9–10.3)
Chloride: 109 mmol/L (ref 98–111)
Creatinine: 0.61 mg/dL (ref 0.44–1.00)
GFR, Estimated: >60 mL/min (ref >60)
Glucose, Bld: 118 mg/dL — ABNORMAL HIGH (ref 70–99)
Potassium: 3.5 mmol/L (ref 3.5–5.1)
Sodium: 145 mmol/L (ref 135–145)
Total Bilirubin: 0.6 mg/dL (ref 0.3–1.2)
Total Protein: 6 g/dL — ABNORMAL LOW (ref 6.5–8.1)

## 2022-06-27 LAB — CBC WITH DIFFERENTIAL (CANCER CENTER ONLY)
Abs Immature Granulocytes: 1.03 10*3/uL — ABNORMAL HIGH (ref 0.00–0.07)
Basophils Absolute: 0.1 10*3/uL (ref 0.0–0.1)
Basophils Relative: 1 %
Eosinophils Absolute: 0.3 10*3/uL (ref 0.0–0.5)
Eosinophils Relative: 2 %
HCT: 30.7 % — ABNORMAL LOW (ref 36.0–46.0)
Hemoglobin: 9.5 g/dL — ABNORMAL LOW (ref 12.0–15.0)
Immature Granulocytes: 9 %
Lymphocytes Relative: 20 %
Lymphs Abs: 2.3 10*3/uL (ref 0.7–4.0)
MCH: 34.5 pg — ABNORMAL HIGH (ref 26.0–34.0)
MCHC: 30.9 g/dL (ref 30.0–36.0)
MCV: 111.6 fL — ABNORMAL HIGH (ref 80.0–100.0)
Monocytes Absolute: 1 10*3/uL (ref 0.1–1.0)
Monocytes Relative: 8 %
Neutro Abs: 6.8 10*3/uL (ref 1.7–7.7)
Neutrophils Relative %: 60 %
Platelet Count: 235 10*3/uL (ref 150–400)
RBC: 2.75 MIL/uL — ABNORMAL LOW (ref 3.87–5.11)
RDW: 16.6 % — ABNORMAL HIGH (ref 11.5–15.5)
WBC Count: 11.4 10*3/uL — ABNORMAL HIGH (ref 4.0–10.5)
nRBC: 0.5 % — ABNORMAL HIGH (ref 0.0–0.2)

## 2022-06-27 MED ORDER — HEPARIN SOD (PORK) LOCK FLUSH 100 UNIT/ML IV SOLN: 500.0000 [IU] | Freq: Once | INTRAVENOUS | Status: DC | PRN

## 2022-06-27 MED ORDER — SODIUM CHLORIDE 0.9% FLUSH
10.0000 mL | INTRAVENOUS | Status: DC | PRN
  Administered 2022-06-27: 10 mL

## 2022-06-27 MED ORDER — SODIUM CHLORIDE 0.9 % IV SOLN
5.0000 mg/kg | Freq: Once | INTRAVENOUS | Status: AC
  Administered 2022-06-27: 500 mg via INTRAVENOUS
  Filled 2022-06-27: qty 4

## 2022-06-27 MED ORDER — SODIUM CHLORIDE 0.9 % IV SOLN
400.0000 mg/m2 | Freq: Once | INTRAVENOUS | Status: AC
  Administered 2022-06-27: 896 mg via INTRAVENOUS
  Filled 2022-06-27: qty 44.8

## 2022-06-27 MED ORDER — SODIUM CHLORIDE 0.9 % IV SOLN: Freq: Once | INTRAVENOUS | Status: AC

## 2022-06-27 MED ORDER — SODIUM CHLORIDE 0.9 % IV SOLN
100.0000 mg/m2 | Freq: Once | INTRAVENOUS | Status: AC
  Administered 2022-06-27: 220 mg via INTRAVENOUS
  Filled 2022-06-27: qty 11

## 2022-06-27 MED ORDER — PALONOSETRON HCL INJECTION 0.25 MG/5ML
0.2500 mg | Freq: Once | INTRAVENOUS | Status: AC
  Administered 2022-06-27: 0.25 mg via INTRAVENOUS
  Filled 2022-06-27: qty 5

## 2022-06-27 MED ORDER — ATROPINE SULFATE 1 MG/ML IV SOLN
0.5000 mg | Freq: Once | INTRAVENOUS | Status: AC | PRN
  Administered 2022-06-27: 0.5 mg via INTRAVENOUS
  Filled 2022-06-27: qty 1

## 2022-06-27 MED ORDER — SODIUM CHLORIDE 0.9 % IV SOLN
2400.0000 mg/m2 | INTRAVENOUS | Status: DC
  Administered 2022-06-27: 5000 mg via INTRAVENOUS
  Filled 2022-06-27: qty 100

## 2022-06-27 MED ORDER — SODIUM CHLORIDE 0.9 % IV SOLN
10.0000 mg | Freq: Once | INTRAVENOUS | Status: AC
  Administered 2022-06-27: 10 mg via INTRAVENOUS
  Filled 2022-06-27: qty 10

## 2022-06-27 MED ORDER — SODIUM CHLORIDE 0.9% FLUSH: 10.0000 mL | INTRAVENOUS | Status: DC | PRN

## 2022-06-27 NOTE — Addendum Note (Signed)
Addended by: Elayne Guerin on: 06/27/2022 11:34 AM   Modules accepted: Orders

## 2022-06-27 NOTE — Patient Instructions (Signed)
[facility]  Discharge Instructions: Thank you for choosing [facility] to provide your oncology and hematology care.   If you have a lab appointment with the [facility], please go directly to the [facility] and check in at the registration area.   Wear comfortable clothing and clothing appropriate for easy access to any Portacath or PICC line.   We strive to give you quality time with your provider. You may need to reschedule your appointment if you arrive late (15 or more minutes).  Arriving late affects you and other patients whose appointments are after yours.  Also, if you miss three or more appointments without notifying the office, you may be dismissed from the clinic at the provider's discretion.      For prescription refill requests, have your pharmacy contact our office and allow 72 hours for refills to be completed.    Today you received the following chemotherapy and/or immunotherapy agents: Bevacizumab, Irinotecan, Leucovorin, and Fluorouracil.   To help prevent nausea and vomiting after your treatment, we encourage you to take your nausea medication as directed.  BELOW ARE SYMPTOMS THAT SHOULD BE REPORTED IMMEDIATELY: *FEVER GREATER THAN 100.4 F (38 C) OR HIGHER *CHILLS OR SWEATING *NAUSEA AND VOMITING THAT IS NOT CONTROLLED WITH YOUR NAUSEA MEDICATION *UNUSUAL SHORTNESS OF BREATH *UNUSUAL BRUISING OR BLEEDING *URINARY PROBLEMS (pain or burning when urinating, or frequent urination) *BOWEL PROBLEMS (unusual diarrhea, constipation, pain near the anus) TENDERNESS IN MOUTH AND THROAT WITH OR WITHOUT PRESENCE OF ULCERS (sore throat, sores in mouth, or a toothache) UNUSUAL RASH, SWELLING OR PAIN  UNUSUAL VAGINAL DISCHARGE OR ITCHING   Items with * indicate a potential emergency and should be followed up as soon as possible or go to the Emergency Department if any problems should occur.  Please show the CHEMOTHERAPY ALERT  CARD or IMMUNOTHERAPY ALERT CARD at check-in to the Emergency Department and triage nurse.  Should you have questions after your visit or need to cancel or reschedule your appointment, please contact [facility]  Dept: [phone]  and follow the prompts.  Office hours are 8:00 a.m. to 4:30 p.m. Monday - Friday. Please note that voicemails left after 4:00 p.m. may not be returned until the following business day.  We are closed weekends and major holidays. You have access to a nurse at all times for urgent questions. Please call the main number to the clinic Dept: [phone] and follow the prompts.   For any non-urgent questions, you may also contact your provider using MyChart. We now offer e-Visits for anyone 67 and older to request care online for non-urgent symptoms. For details visit [email or web address].   Also download the MyChart app! Go to the app store, search "MyChart", open the app, select [facility], and log in with your MyChart username and password.  The chemotherapy medication bag should finish at 46 hours, 96 hours, or 7 days. For example, if your pump is scheduled for 46 hours and it was put on at 4:00 p.m., it should finish at 2:00 p.m. the day it is scheduled to come off regardless of your appointment time.     Estimated time to finish at:    If the display on your pump reads "Low Volume" and it is beeping, take the batteries out of the pump and come to the cancer center for it to be taken off.   If the pump alarms go off prior to  the pump reading "Low Volume" then call [phone] and someone can assist you.  If the plunger comes out and the chemotherapy medication is leaking out, please use your home chemo spill kit to clean up the spill. Do NOT use paper towels or other household products.  If you have problems or questions regarding your pump, please call either [phone] (24 hours a day) or the cancer center Monday-Friday  8:00 a.m.- 4:30 p.m. at the clinic number and we will assist you. If you are unable to get assistance, then go to the nearest Emergency Department and ask the staff to contact the IV team for assistance.

## 2022-06-27 NOTE — Progress Notes (Signed)
Nutrition Follow-up:  Patient with right colon cancer.  Patient receiving fofiri and bevacizumab.    Met with patient during infusion.  Reports that her appetite has improved.  Still having taste alterations.  Feels better with dose reduction in chemotherapy.  Nephew was recently staying with her for 2 days and took her out to eat.      Medications: reviewed  Labs: reviewed  Anthropometrics:   Weight 221 lb 9.6 oz on 2/26, stable  222 lb 8 oz on 2/12 248 lb on 10/11   NUTRITION DIAGNOSIS: Inadequate oral intake improving    INTERVENTION:  Patient to continue eating well and including good source of protein at meals    MONITORING, EVALUATION, GOAL: weight trends, intake   NEXT VISIT: Monday, March 25 during infusion  Ismeal Heider B. Zenia Resides, [place], [place] Registered Dietitian [phone]

## 2022-06-29 ENCOUNTER — Other Ambulatory Visit: Payer: Self-pay

## 2022-06-29 ENCOUNTER — Inpatient Hospital Stay: Payer: Medicare Other

## 2022-06-29 VITALS — BP 156/60 | HR 63 | Temp 99.0°F | Resp 18

## 2022-06-29 DIAGNOSIS — E1136 Type 2 diabetes mellitus with diabetic cataract: Secondary | ICD-10-CM | POA: Diagnosis not present

## 2022-06-29 DIAGNOSIS — C182 Malignant neoplasm of ascending colon: Secondary | ICD-10-CM | POA: Diagnosis not present

## 2022-06-29 DIAGNOSIS — G620 Drug-induced polyneuropathy: Secondary | ICD-10-CM | POA: Diagnosis not present

## 2022-06-29 DIAGNOSIS — C786 Secondary malignant neoplasm of retroperitoneum and peritoneum: Secondary | ICD-10-CM | POA: Diagnosis not present

## 2022-06-29 DIAGNOSIS — Z5189 Encounter for other specified aftercare: Secondary | ICD-10-CM | POA: Diagnosis not present

## 2022-06-29 DIAGNOSIS — E1142 Type 2 diabetes mellitus with diabetic polyneuropathy: Secondary | ICD-10-CM | POA: Diagnosis not present

## 2022-06-29 DIAGNOSIS — Z853 Personal history of malignant neoplasm of breast: Secondary | ICD-10-CM | POA: Diagnosis not present

## 2022-06-29 DIAGNOSIS — I10 Essential (primary) hypertension: Secondary | ICD-10-CM | POA: Diagnosis not present

## 2022-06-29 DIAGNOSIS — C7963 Secondary malignant neoplasm of bilateral ovaries: Secondary | ICD-10-CM | POA: Diagnosis not present

## 2022-06-29 DIAGNOSIS — Z5111 Encounter for antineoplastic chemotherapy: Secondary | ICD-10-CM | POA: Diagnosis not present

## 2022-06-29 MED ORDER — HEPARIN SOD (PORK) LOCK FLUSH 100 UNIT/ML IV SOLN
500.0000 [IU] | Freq: Once | INTRAVENOUS | Status: AC | PRN
  Administered 2022-06-29: 500 [IU]

## 2022-06-29 MED ORDER — SODIUM CHLORIDE 0.9% FLUSH
10.0000 mL | INTRAVENOUS | Status: DC | PRN
  Administered 2022-06-29: 10 mL

## 2022-06-30 ENCOUNTER — Telehealth: Payer: Self-pay

## 2022-06-30 NOTE — Patient Outreach (Signed)
  Care Coordination   Initial Visit Note   06/30/2022 Name: Kim Castro MRN: [id number] DOB: [birth year]  Kim Castro is a 77 y.o. year old female who sees Kim Castro, Lillette Boxer, MD for primary care. I spoke with  Kim Castro by phone today.  What matters to the patients health and wellness today?  Patient doing pretty good.  Undergoing chemo treatment and things are good with treatment.    Goals Addressed             This Visit's Progress    COMPLETED: Care Coordination Activities-No follow up required       Care Coordination Interventions: Advised patient to Annual Wellness exam. Discussed [facility] services and support. Assessed SDOH Advised to discuss with primary care physician if services needed in the future.  Interventions Today    Flowsheet Row Most Recent Value  Chronic Disease   Chronic disease during today's visit Diabetes  General Interventions   General Interventions Discussed/Reviewed General Interventions Discussed, Doctor Visits  Doctor Visits Discussed/Reviewed Doctor Visits Discussed, Annual Wellness Visits  Education Interventions   Education Provided Provided Education  [place] Discussed              SDOH assessments and interventions completed:  Yes  SDOH Interventions Today    Flowsheet Row Most Recent Value  SDOH Interventions   Food Insecurity Interventions Intervention Not Indicated  Housing Interventions Intervention Not Indicated  Transportation Interventions Intervention Not Indicated        Care Coordination Interventions:  Yes, provided   Follow up plan: No further intervention required.   Encounter Outcome:  Pt. Visit Completed   Jone Baseman, RN, MSN [facility] Management Care Management Coordinator Direct Line [phone]

## 2022-06-30 NOTE — Patient Instructions (Signed)
Visit Information  Thank you for taking time to visit with me today. Please don't hesitate to contact me if I can be of assistance to you.   Following are the goals we discussed today:   Goals Addressed             This Visit's Progress    COMPLETED: Care Coordination Activities-No follow up required       Care Coordination Interventions: Advised patient to Annual Wellness exam. Discussed [facility] services and support. Assessed SDOH Advised to discuss with primary care physician if services needed in the future.  Interventions Today    Flowsheet Row Most Recent Value  Chronic Disease   Chronic disease during today's visit Diabetes  General Interventions   General Interventions Discussed/Reviewed General Interventions Discussed, Doctor Visits  Doctor Visits Discussed/Reviewed Doctor Visits Discussed, Annual Wellness Visits  Education Interventions   Education Provided Provided Education  [place] Discussed                If you are experiencing a Mental Health or [place] or need someone to talk to, please call the Suicide and Crisis Lifeline: 988   Patient verbalizes understanding of instructions and care plan provided today and agrees to view in [place]. Active MyChart status and patient understanding of how to access instructions and care plan via MyChart confirmed with patient.     No further follow up required: decline  Jone Baseman, RN, MSN [place] Management Care Management Coordinator Direct Line [phone]

## 2022-07-08 MED FILL — Dexamethasone Sodium Phosphate Inj 100 MG/10ML: INTRAMUSCULAR | Qty: 1 | Status: AC

## 2022-07-10 NOTE — Assessment & Plan Note (Signed)
-  Secondary to oxaliplatin -She is on Lyrica and B complex, continue to monitor.  Overall stable.   

## 2022-07-10 NOTE — Assessment & Plan Note (Signed)
-  Dx in 05/2014, s/p left lumpectomy with Dr Hoxworth, adjuvant RT with Dr Wentworth, and Anastrozole 08/2014 - 01/13/20 under Dr Gudena.  -DEXA 09/11/20 T score +1.2 normal.  -most recent mammogram on 10/04/21 was negative. Will continue yearly  

## 2022-07-10 NOTE — Assessment & Plan Note (Signed)
pT3N2aM0 stage IIB, MSS, metastatic to b/l ovaries and peritoneum and liver, KRAS mutation G12V (+)  -Initially diagnosed in 08/2019, s/p resection with Dr Marcello Moores on 11/01/19. Path showed overall Stage IIIB cancer.  -s/p 6 months adjuvant FOLFOX, completed 06/01/20, oxaliplatin held for final 2 cycles due to neuropathy.  -s/p BSO and hysterectomy on 12/22/21 with Dr. Berline Lopes. Path revealed metastatic moderately differentiated colonic adenocarcinoma involving both ovaries and peritoneum -she restarted FOLFOX on 01/24/22. -Due to disease progression, chemotherapy changed to second line FOLFIRI and bevacizumab on April 06, 2022. She has been tolerating moderately well, better now with dose reduction  -Restaging CT scan from June 23, 2022 showed partial response in liver and peritoneal metastasis, no other new lesions.  I reviewed the images and discussed the results with patient.  She is pleased. -She has been tolerating chemotherapy much better lately due to the dose reduction.  We discussed maintenance therapy option in future

## 2022-07-11 ENCOUNTER — Inpatient Hospital Stay: Payer: Medicare Other | Attending: Hematology | Admitting: Hematology

## 2022-07-11 ENCOUNTER — Inpatient Hospital Stay: Payer: Medicare Other

## 2022-07-11 ENCOUNTER — Encounter: Payer: Self-pay | Admitting: Hematology

## 2022-07-11 ENCOUNTER — Other Ambulatory Visit: Payer: Self-pay

## 2022-07-11 VITALS — BP 151/48 | HR 70 | Temp 98.3°F | Resp 16 | Ht 65.0 in | Wt 217.0 lb

## 2022-07-11 DIAGNOSIS — T451X5A Adverse effect of antineoplastic and immunosuppressive drugs, initial encounter: Secondary | ICD-10-CM

## 2022-07-11 DIAGNOSIS — C182 Malignant neoplasm of ascending colon: Secondary | ICD-10-CM

## 2022-07-11 DIAGNOSIS — Z803 Family history of malignant neoplasm of breast: Secondary | ICD-10-CM

## 2022-07-11 DIAGNOSIS — Z853 Personal history of malignant neoplasm of breast: Secondary | ICD-10-CM | POA: Diagnosis not present

## 2022-07-11 DIAGNOSIS — G620 Drug-induced polyneuropathy: Secondary | ICD-10-CM | POA: Diagnosis not present

## 2022-07-11 DIAGNOSIS — C787 Secondary malignant neoplasm of liver and intrahepatic bile duct: Secondary | ICD-10-CM | POA: Diagnosis not present

## 2022-07-11 DIAGNOSIS — Z5189 Encounter for other specified aftercare: Secondary | ICD-10-CM | POA: Insufficient documentation

## 2022-07-11 DIAGNOSIS — C7963 Secondary malignant neoplasm of bilateral ovaries: Secondary | ICD-10-CM | POA: Diagnosis not present

## 2022-07-11 DIAGNOSIS — C786 Secondary malignant neoplasm of retroperitoneum and peritoneum: Secondary | ICD-10-CM | POA: Diagnosis not present

## 2022-07-11 DIAGNOSIS — Z8049 Family history of malignant neoplasm of other genital organs: Secondary | ICD-10-CM

## 2022-07-11 DIAGNOSIS — E119 Type 2 diabetes mellitus without complications: Secondary | ICD-10-CM

## 2022-07-11 DIAGNOSIS — I10 Essential (primary) hypertension: Secondary | ICD-10-CM

## 2022-07-11 DIAGNOSIS — Z5111 Encounter for antineoplastic chemotherapy: Secondary | ICD-10-CM | POA: Insufficient documentation

## 2022-07-11 DIAGNOSIS — Z95828 Presence of other vascular implants and grafts: Secondary | ICD-10-CM

## 2022-07-11 DIAGNOSIS — Z170 Estrogen receptor positive status [ER+]: Secondary | ICD-10-CM

## 2022-07-11 DIAGNOSIS — Z800 Family history of malignant neoplasm of digestive organs: Secondary | ICD-10-CM

## 2022-07-11 LAB — CMP (CANCER CENTER ONLY)
ALT: 14 U/L (ref 0–44)
AST: 21 U/L (ref 15–41)
Albumin: 3.8 g/dL (ref 3.5–5.0)
Alkaline Phosphatase: 90 U/L (ref 38–126)
Anion gap: 7 (ref 5–15)
BUN: 15 mg/dL (ref 8–23)
CO2: 26 mmol/L (ref 22–32)
Calcium: 9.1 mg/dL (ref 8.9–10.3)
Chloride: 110 mmol/L (ref 98–111)
Creatinine: 0.64 mg/dL (ref 0.44–1.00)
GFR, Estimated: >60 mL/min (ref >60)
Glucose, Bld: 156 mg/dL — ABNORMAL HIGH (ref 70–99)
Potassium: 4 mmol/L (ref 3.5–5.1)
Sodium: 143 mmol/L (ref 135–145)
Total Bilirubin: 0.8 mg/dL (ref 0.3–1.2)
Total Protein: 6.2 g/dL — ABNORMAL LOW (ref 6.5–8.1)

## 2022-07-11 LAB — CBC WITH DIFFERENTIAL (CANCER CENTER ONLY)
Abs Immature Granulocytes: 0.01 10*3/uL (ref 0.00–0.07)
Basophils Absolute: 0 10*3/uL (ref 0.0–0.1)
Basophils Relative: 1 %
Eosinophils Absolute: 0.2 10*3/uL (ref 0.0–0.5)
Eosinophils Relative: 4 %
HCT: 31.7 % — ABNORMAL LOW (ref 36.0–46.0)
Hemoglobin: 9.9 g/dL — ABNORMAL LOW (ref 12.0–15.0)
Immature Granulocytes: 0 %
Lymphocytes Relative: 34 %
Lymphs Abs: 1.3 10*3/uL (ref 0.7–4.0)
MCH: 33.8 pg (ref 26.0–34.0)
MCHC: 31.2 g/dL (ref 30.0–36.0)
MCV: 108.2 fL — ABNORMAL HIGH (ref 80.0–100.0)
Monocytes Absolute: 0.4 10*3/uL (ref 0.1–1.0)
Monocytes Relative: 10 %
Neutro Abs: 1.9 10*3/uL (ref 1.7–7.7)
Neutrophils Relative %: 51 %
Platelet Count: 239 10*3/uL (ref 150–400)
RBC: 2.93 MIL/uL — ABNORMAL LOW (ref 3.87–5.11)
RDW: 15.9 % — ABNORMAL HIGH (ref 11.5–15.5)
WBC Count: 3.8 10*3/uL — ABNORMAL LOW (ref 4.0–10.5)
nRBC: 0 % (ref 0.0–0.2)

## 2022-07-11 LAB — TOTAL PROTEIN, URINE DIPSTICK: Protein, ur: NEGATIVE mg/dL

## 2022-07-11 LAB — CEA (IN HOUSE-CHCC): CEA (CHCC-In House): 6.54 ng/mL — ABNORMAL HIGH (ref 0.00–5.00)

## 2022-07-11 MED ORDER — SODIUM CHLORIDE 0.9 % IV SOLN: Freq: Once | INTRAVENOUS | Status: AC

## 2022-07-11 MED ORDER — SODIUM CHLORIDE 0.9 % IV SOLN
400.0000 mg/m2 | Freq: Once | INTRAVENOUS | Status: AC
  Administered 2022-07-11: 896 mg via INTRAVENOUS
  Filled 2022-07-11: qty 44.8

## 2022-07-11 MED ORDER — SODIUM CHLORIDE 0.9 % IV SOLN
10.0000 mg | Freq: Once | INTRAVENOUS | Status: AC
  Administered 2022-07-11: 10 mg via INTRAVENOUS
  Filled 2022-07-11: qty 10

## 2022-07-11 MED ORDER — SODIUM CHLORIDE 0.9 % IV SOLN
100.0000 mg/m2 | Freq: Once | INTRAVENOUS | Status: AC
  Administered 2022-07-11: 220 mg via INTRAVENOUS
  Filled 2022-07-11: qty 11

## 2022-07-11 MED ORDER — SODIUM CHLORIDE 0.9% FLUSH
10.0000 mL | INTRAVENOUS | Status: DC | PRN
  Administered 2022-07-11: 10 mL

## 2022-07-11 MED ORDER — PALONOSETRON HCL INJECTION 0.25 MG/5ML
0.2500 mg | Freq: Once | INTRAVENOUS | Status: AC
  Administered 2022-07-11: 0.25 mg via INTRAVENOUS
  Filled 2022-07-11: qty 5

## 2022-07-11 MED ORDER — ATROPINE SULFATE 1 MG/ML IV SOLN
0.5000 mg | Freq: Once | INTRAVENOUS | Status: AC | PRN
  Administered 2022-07-11: 0.5 mg via INTRAVENOUS
  Filled 2022-07-11: qty 1

## 2022-07-11 MED ORDER — SODIUM CHLORIDE 0.9 % IV SOLN
5.0000 mg/kg | Freq: Once | INTRAVENOUS | Status: AC
  Administered 2022-07-11: 500 mg via INTRAVENOUS
  Filled 2022-07-11: qty 4

## 2022-07-11 MED ORDER — SODIUM CHLORIDE 0.9 % IV SOLN
2400.0000 mg/m2 | INTRAVENOUS | Status: DC
  Administered 2022-07-11: 5000 mg via INTRAVENOUS
  Filled 2022-07-11: qty 100

## 2022-07-11 NOTE — Progress Notes (Signed)
[place]   Telephone:([phone]) [phone] [phone]) [phone]   Clinic Follow up Note   Patient Care Team: [name], MD as PCP - General (Family Medicine) Rolm Bookbinder, MD as Consulting Physician (General Surgery) Truitt Merle, MD as Consulting Physician (Hematology) Leighton Ruff, MD as Consulting Physician (General Surgery) Nicholas Lose, MD as Consulting Physician (Hematology and Oncology)  Date of Service:  07/11/2022  CHIEF COMPLAINT: f/u of right colon cancer and h/o DCIS     CURRENT THERAPY: Second line FOLFIRI/beva started on 04/04/22     ASSESSMENT:  Kim Castro is a 77 y.o. female with   Malignant neoplasm of ascending colon ([place]) pT3N2aM0 stage IIB, MSS, metastatic to b/l ovaries and peritoneum and liver, KRAS mutation G12V (+)  -Initially diagnosed in 08/2019, s/p resection with Dr Marcello Moores on 11/01/19. Path showed overall Stage IIIB cancer.  -s/p 6 months adjuvant FOLFOX, completed 06/01/20, oxaliplatin held for final 2 cycles due to neuropathy.  -s/p BSO and hysterectomy on 12/22/21 with Dr. Berline Lopes. Path revealed metastatic moderately differentiated colonic adenocarcinoma involving both ovaries and peritoneum -she restarted FOLFOX on 01/24/22. -Due to disease progression, chemotherapy changed to second line FOLFIRI and bevacizumab on April 06, 2022. She has been tolerating moderately well, better now with dose reduction  -Restaging CT scan from June 23, 2022 showed partial response in liver and peritoneal metastasis, no other new lesions.  I reviewed the images and discussed the results with patient.  She is pleased. -She has been tolerating chemotherapy much better lately due to the dose reduction.  We discussed maintenance therapy option in future   Malignant neoplasm of female breast [facility]) -Dx in 05/2014, s/p left lumpectomy with Dr Excell Seltzer, adjuvant RT with Dr Pablo Ledger, and Anastrozole 08/2014 - 01/13/20 under Dr Lindi Adie.  -DEXA 09/11/20 T  score +1.2 normal.  -most recent mammogram on 10/04/21 was negative. Will continue yearly    Peripheral neuropathy due to chemotherapy ([place]) -Secondary to oxaliplatin -She is on Lyrica and B complex, continue to monitor.  Overall stable.     PLAN: - labs reviewed - continue with C9 Flofiri today at same dose and continue every 2 weeks -F/U with APP in 2 weeks and me in 4 weeks     SUMMARY OF ONCOLOGIC HISTORY: Oncology History Overview Note   Cancer Staging  Malignant neoplasm of female breast [facility]) Staging form: Breast, AJCC 7th Edition - Clinical stage from 06/11/2014: Stage 0 (Tis (DCIS), N0, M0) - Unsigned Staged by: Pathologist and managing physician Laterality: Left Estrogen receptor status: Positive Progesterone receptor status: Positive Stage used in treatment planning: Yes National guidelines used in treatment planning: Yes Type of national guideline used in treatment planning: NCCN - Pathologic stage from 07/03/2014: Stage Unknown (Tis (DCIS), NX, cM0) - Signed by Enid Cutter, MD on 07/10/2014 Staged by: Pathologist Laterality: Left Estrogen receptor status: Positive Progesterone receptor status: Positive Stage used in treatment planning: Yes National guidelines used in treatment planning: Yes Type of national guideline used in treatment planning: NCCN Staging comments: Staged on final lumpectomy specimen by Dr. Donato Heinz.  Right colon cancer Staging form: Colon and Rectum, AJCC 8th Edition - Pathologic stage from 11/01/2019: Stage IIIB (pT3, pN2a, cM0) - Signed by Truitt Merle, MD on 11/29/2019 Stage prefix: Initial diagnosis Histologic grading system: 4 grade system Histologic grade (G): G2 Residual tumor (R): R0 - None Tumor deposits (TD): Absent Perineural invasion (PNI): Absent Microsatellite instability (MSI): Stable KRAS mutation: Unknown NRAS mutation: Unknown BRAF mutation: Unknown  Malignant neoplasm of female breast ([place])  05/29/2014 Initial Biopsy    Left breast needle core biopsy: Grade 2, DCIS with calcs. ER+ (100%), PR+ (96%).    06/04/2014 Initial Diagnosis   Left breast DCIS with calcifications, ER 100%, PR 96%   06/10/2014 Breast MRI   Left breast: 2.4 x 1.3 x 1.1 cm area of patchy non-mass enhancement upper outer quadrant includes postbiopsy seroma; Right breast: 1.2 cm previously biopsied stable benign fibroadenoma   06/12/2014 Procedure   Genetic counseling/testing: Identified 1 VUS on CHEK2 gene. Remainder of 17 gene panel tested negative and included: ATM, BARD1, BRCA 1/2, BRIP1, CDH1, CHEK2, EPCAM, MLH1, MSH2, MSH6, NBN, NF1, PALB2, PTEN, RAD50, RAD51C, RAD51D, STK11, and TP53.    07/01/2014 Surgery   Left breast lumpectomy ([place]): Grade 1, DCIS, spanning 2.3 cm, 1 mm margin, ER 100%, PR 96%   07/31/2014 - 08/28/2014 Radiation Therapy   Adjuvant RT completed Pablo Ledger). Left breast: Total dose 42.5 Gy over 17 fractions. Left breast boost: Total dose 7.5 Gy over 3 fractions.    09/14/2014 - 01/13/2020 Anti-estrogen oral therapy   Anastrazole '1mg'$  daily. Planned duration of treatment: 5 years [place]). Completed in 01/2020.    09/25/2014 Survivorship   Survivorship Care Plan given to patient and reviewed with her in person.    03/02/2021 Imaging   CT CAP  IMPRESSION: 1. No findings of active/recurrent malignancy. Partial right hemicolectomy. 2. Endometrial stripe remains mildly thickened, but endometrial biopsy in May was negative for malignancy. 3. Progressive endplate sclerosis and endplate irregularity at T2-3, probably due to degenerative endplate findings. If the has referable upper thoracic pain/symptoms then thoracic spine MRI could be used for further workup. 4. Other imaging findings of potential clinical significance: Mild cardiomegaly. Aortic Atherosclerosis (ICD10-I70.0). Mild mitral valve calcification. Postoperative findings in the left breast with adjacent radiation port anteriorly in the left upper lobe.  Tiny pulmonary nodules in the left lower lobe are unchanged from earliest available comparison of 10/17/2019 and probably benign although may merit surveillance. Multilevel lumbar impingement. Mild pelvic floor laxity.   Malignant neoplasm of ascending colon ([place])  09/19/2019 Imaging   CT AP W contrast 09/19/19  IMPRESSION Fullness in the cecum, cannot exclude a mass. No evidence for metastatic disease is identified.    09/24/2019 Procedure   Colonoscopy by Dr Eber Jones 09/24/19 IMPRESSION 1. The colon was redundant  2. Mild diverticulosis was noted through the entire examined colon 3. Single 56m polyp was found in the ascending colon; polypectomy was performed using snare cautery and biopsy forceps 4. Mild diverticulosis was notes in the descending colon and sigmoid colon.  5. Single polyp was found in the sigmoid colon, polypectomy was performed with cold forceps.  6. Single polyp was found in the rectosigmoid colon; polypectomy was performed with cold snare  7. Small internal hemorrhoids  8. Large mass was found at the cecum; multiple biopsies of the area were performed using cold forceps; injection (tattooing) was performed distal to the mass.    09/24/2019 Initial Biopsy   INTERPRETATION AND DIAGNOSIS:  A. Cecum, biopsy:  Invasive moderately differentiated adenocarcinoma.  see comment  B. Polyp @ ascending colon, polypectomy:  Tubular Adenoma  C. Polyp @ sigmoid colon Polypectomy:  hyperplastic polyp.  D. Polyp @ rectosigmoid colon, Polypectomy:  Hyperplastic Polyp      10/16/2019 Imaging   CT Chest IMPRESSION: 1. Multiple small pulmonary nodules measuring 5 mm or less in size in the lungs. These are nonspecific and are typically considered statistically  likely benign. However, given the patient's history of primary malignancy, close attention on follow-up studies is recommended to ensure stability. 2. Aortic atherosclerosis, in addition to right coronary artery disease.  Assessment for potential risk factor modification, dietary therapy or pharmacologic therapy may be warranted, if clinically indicated. 3. There are calcifications of the aortic valve and mitral annulus. Echocardiographic correlation for evaluation of potential valvular dysfunction may be warranted if clinically indicated. 4. Small hiatal hernia.   Aortic Atherosclerosis (ICD10-I70.0).   11/01/2019 Initial Diagnosis   Colon cancer ([place])   11/01/2019 Surgery   LAPAROSCOPIC PARTIAL COLECTOMY by Dr Marcello Moores and Dr Johney Maine   11/01/2019 Pathology Results   FINAL MICROSCOPIC DIAGNOSIS:   A. COLON, PROXIMAL RIGHT, COLECTOMY:  - Invasive colonic adenocarcinoma, 5 cm.  - Tumor invades through the muscularis propria into pericolonic tissues.   - Margins of resection are not involved.  - Metastatic carcinoma in (5) of (13) lymph nodes.  - See oncology table.    MSI Stable  Mismatch repair normal  MLH1 - Preserved nuclear expression (greater 50% tumor expression) MSH2 - Preserved nuclear expression (greater 50% tumor expression) MSH6 - Preserved nuclear expression (greater 50% tumor expression) PMS2 - Preserved nuclear expression (greater 50% tumor expression)   11/01/2019 Cancer Staging   Staging form: Colon and Rectum, AJCC 8th Edition - Pathologic stage from 11/01/2019: Stage IIIB (pT3, pN2a, cM0) - Signed by Truitt Merle, MD on 11/29/2019   12/10/2019 Procedure   PAC placed 12/10/19   12/17/2019 - 06/01/2020 Chemotherapy   FOLFOX q2weeks starting in 2 weeks starting 12/17/19. Held 01/27/20-02/10/20 due to b/l PE. Oxaliplatin held C11-12 due to neuropathy. Completed on 06/01/20   03/02/2021 Imaging   CT CAP  IMPRESSION: 1. No findings of active/recurrent malignancy. Partial right hemicolectomy. 2. Endometrial stripe remains mildly thickened, but endometrial biopsy in May was negative for malignancy. 3. Progressive endplate sclerosis and endplate irregularity at T2-3, probably due to degenerative  endplate findings. If the has referable upper thoracic pain/symptoms then thoracic spine MRI could be used for further workup. 4. Other imaging findings of potential clinical significance: Mild cardiomegaly. Aortic Atherosclerosis (ICD10-I70.0). Mild mitral valve calcification. Postoperative findings in the left breast with adjacent radiation port anteriorly in the left upper lobe. Tiny pulmonary nodules in the left lower lobe are unchanged from earliest available comparison of 10/17/2019 and probably benign although may merit surveillance. Multilevel lumbar impingement. Mild pelvic floor laxity.   08/26/2021 Imaging   EXAM: CT CHEST, ABDOMEN, AND PELVIS WITH CONTRAST  IMPRESSION: 1. Stable examination without new or progressive findings to suggest local recurrence or metastatic disease within the chest, abdomen, or pelvis. 2. Hepatomegaly with hepatic steatosis. 3. Sigmoid colonic diverticulosis without findings of acute diverticulitis. 4. Similar prominent endplate sclerosis and irregularity at T2-T3 is most consistent with Modic type endplate degenerative changes. However, if patient has referable upper thoracic pain consider further workup with thoracic spine MRI. 5. Similar thickening of the endometrial stripe measuring up to 8 mm, which was previously biopsied with results negative for malignancy. 6. Aortic Atherosclerosis (ICD10-I70.0).   11/10/2021 PET scan   IMPRESSION: 1. LEFT ovary is increased in size and is intensely hypermetabolic. While physiologic hypermetabolic ovarian tissue is not uncommon, the enlargement and asymmetric activity warrants further evaluation. Consider contrast pelvic MRI vs tissue sampling. 2. No evidence of metastatic colorectal carcinoma otherwise. 3. Post RIGHT hemicolectomy anatomy. 4. Evidence of radiation change in the LEFT upper lobe (remote breast cancer).     12/22/2021 Relapse/Recurrence  FINAL MICROSCOPIC DIAGNOSIS:   A. LEFT OVARY  AND FALLOPIAN TUBE, SALPINGO OOPHORECTOMY:  - Metastatic moderately differentiated colonic adenocarcinoma involving left ovary  - Focal ovarian stromal calcification  - Segment of benign fallopian tube   B. UTERUS WITH RIGHT FALLOPIAN TUBE AND OVARY, HYSTERECTOMY AND SALPINGO-OOPHORECTOMY:  - Metastatic moderately differentiated colonic adenocarcinoma involving right ovary  - Focal invasive extensive adenomyosis  - Benign endometrial polyps  - Benign proliferative phase endometrium  - Hydrosalpinx of right fallopian tube  - Focal ovarian stromal calcification   C. PERITONEAL DEPOSITS, ANTERIOR CUL DE SAC, BIOPSY:  - Metastatic mucinous adenocarcinoma, consistent with colorectal primary    COMMENT:  Immunohistochemical stains show that the tumor cells are positive for CK20 and CDX2 while they are negative for CK7 and PAX8, consistent with above interpretation.    01/24/2022 - 03/23/2022 Chemotherapy   Patient is on Treatment Plan : COLORECTAL FOLFOX q14d x 3 months     04/01/2022 Imaging   CT AP IMPRESSION: 1. New omental metastatic disease. 2. Vague hypoattenuating lesion in the inferior right hepatic lobe is new and also worrisome for metastatic disease. 3. Similar small right lower lobe nodules. Recommend continued attention on follow-up. 4. Steatotic enlarged liver. 5.  Aortic atherosclerosis (ICD10-I70.0).   04/06/2022 -  Chemotherapy   Patient is on Treatment Plan : COLORECTAL FOLFIRI + Bevacizumab q14d     04/21/2022 Imaging    IMPRESSION: 1. Stable chest CT. No evidence of metastatic disease. 2. Stable small pulmonary nodules, considered benign based on stability. 3. Stable postsurgical changes in the left breast and anterior left upper lobe radiation changes. 4. Aortic Atherosclerosis (ICD10-I70.0) and Emphysema (ICD10-J43.9).   04/21/2022 Imaging    IMPRESSION: 1. Small enhancing lesion inferiorly in the right hepatic lobe is typical of metastatic disease and  stable from recent abdominal CT. No other definite liver lesions are identified. Mild hepatic steatosis. 2. No other significant abdominal findings. 3. Omental nodularity seen on CT is not well visualized by MRI.    Imaging     06/23/2022 Imaging    IMPRESSION: 1. Hypodense lesion of the inferior right lobe of the liver, hepatic segment VI is diminished in size, consistent with treatment response. 2. Tiny peritoneal and omental nodules identified by prior examination are diminished in size, consistent with treatment response. 3. Multiple tiny pulmonary nodules unchanged, most likely benign and incidental, however continued attention on follow-up warranted in the setting of known metastatic disease. 4. No evidence of new metastatic disease in the chest, abdomen, or pelvis. 5. Status post partial right hemicolectomy and reanastomosis. 6. Diffuse mosaic attenuation of the airspaces, consistent with small airways disease. 7. Hepatomegaly.      INTERVAL HISTORY:  Kim Castro is here for a follow up of right colon cancer and h/o DCIS    She was last seen by me on 06/27/2022 She presents to the clinic alone. Pt denies having any diarrhea. Patient has hip pain while walking, very activive with grandchildren. No issues at this time.  All other systems were reviewed with the patient and are negative.  MEDICAL HISTORY:  Past Medical History:  Diagnosis Date   Aortic atherosclerosis (H[facility])    Arthritis    feet, lower back   Basal cell carcinoma    arm   Breast cancer of upper-outer quadrant of left female breast ([place]) 06/04/2014   Cataract    immature on the left   Colon cancer ([place]) 08/2019   Diabetes mellitus without complication ([place])  Diverticulosis    Dizziness    > 65yr ago;took Antivert    Family history of anesthesia complication    sister slow to wake up with anesthesia   Family history of breast cancer    Family history of colon cancer    Family history of  uterine cancer    GERD (gastroesophageal reflux disease)    takes occasional TUMs   History of bronchitis    > 89[place][age]   History of colon polyps    History of hiatal hernia    Small noted on CT   History of pulmonary embolus ([place])    Hypertension    takes Losartan daily and HCTZ   Iron deficiency anemia    Joint pain    Numbness    to toes on each foot   Peripheral neuropathy    feet and toes   Personal history of radiation therapy    Pulmonary nodules    Noted on CT   Radiation 07/31/14-08/28/14   Left Breast 20 fxs   Seasonal allergies    takes Claritin prn   Urinary frequency    Vitamin D deficiency    takes VIt D daily    SURGICAL HISTORY: Past Surgical History:  Procedure Laterality Date   BREAST BIOPSY Bilateral    BREAST LUMPECTOMY Left    BREAST LUMPECTOMY WITH RADIOACTIVE SEED LOCALIZATION Left 07/01/2014   Procedure: LEFT BREAST LUMPECTOMY WITH RADIOACTIVE SEED LOCALIZATION;  Surgeon: BeExcell SeltzerMD;  [place] [facility] Service: General;  Laterality: Left;   CATARACT EXTRACTION Right    COLONOSCOPY  09/24/2019   [place]   LAPAROSCOPIC PARTIAL COLECTOMY N/A 11/01/2019   Procedure: LAPAROSCOPIC PARTIAL COLECTOMY;  Surgeon: ThLeighton RuffMD;  Location: [facility];  Service: General;  Laterality: N/A;   POLYPECTOMY     PORTACATH PLACEMENT N/A 12/10/2019   Procedure: INSERTION PORT-A-CATH ULTRASOUND GUIDED IN RIGHT IJ;  Surgeon: ThLeighton RuffMD;  Location: [facility];  Service: General;  Laterality: N/A;   ROBOTIC ASSISTED TOTAL HYSTERECTOMY Bilateral 12/22/2021   Procedure: XI ROBOTIC ASSISTED TOTAL HYSTERECTOMY WITH BILATERAL SALPINGO OOPHORECTOMY;  Surgeon: TuLafonda MossesMD;  Location: [facility];  Service: Gynecology;  Laterality: Bilateral;   TOTAL KNEE ARTHROPLASTY Left 10/24/2012   Procedure: TOTAL KNEE ARTHROPLASTY;  Surgeon: FrKerin SalenMD;  [facility] Service: Orthopedics;  Laterality: Left;   TOTAL KNEE ARTHROPLASTY  Right 01/09/2013   Procedure: TOTAL KNEE ARTHROPLASTY;  Surgeon: FrKerin SalenMD;  [facility] Service: Orthopedics;  Laterality: Right;   TUBAL LIGATION      I have reviewed the social history and family history with the patient and they are unchanged from previous note.  ALLERGIES:  is allergic to oxycodone.  MEDICATIONS:  Current Outpatient Medications  Medication Sig Dispense Refill   acetaminophen (TYLENOL) 325 MG tablet Take 650 mg by mouth every 6 (six) hours as needed for moderate pain.     b complex vitamins capsule Take 1 capsule by mouth daily.     Cholecalciferol (VITAMIN D) 2000 UNITS CAPS Take 2,000 Units by mouth daily.      Ibuprofen 200 MG CAPS Take by mouth.     loratadine (CLARITIN) 10 MG tablet Take 10 mg by mouth daily as needed for allergies.     losartan (COZAAR) 25 MG tablet Take 1 tablet (25 mg total) by mouth daily. 90 tablet 3   metFORMIN (GLUCOPHAGE) 500 MG tablet Take 1 tablet (500 mg total)  by mouth in the morning and at bedtime. 180 tablet 3   Multiple Vitamins-Minerals (MULTIVITAMIN WITH MINERALS) tablet Take 1 tablet by mouth daily.     omeprazole (PRILOSEC) 20 MG capsule Take 20 mg by mouth daily before breakfast.      potassium chloride (KLOR-CON) 10 MEQ tablet Take 1 tablet by mouth once daily 30 tablet 0   pravastatin (PRAVACHOL) 10 MG tablet Take 1 tablet (10 mg total) by mouth daily. 90 tablet 3   pregabalin (LYRICA) 25 MG capsule Take 1 capsule (25 mg total) by mouth 2 (two) times daily. 60 capsule 3   No current facility-administered medications for this visit.   Facility-Administered Medications Ordered in Other Visits  Medication Dose Route Frequency Provider Last Rate Last Admin   0.9 %  sodium chloride infusion   Intravenous Once Truitt Merle, MD 500 mL/hr at 07/11/22 1009 New Bag at 07/11/22 1009   atropine injection 0.5 mg  0.5 mg Intravenous Once PRN Truitt Merle, MD       bevacizumab-awwb (MVASI) 500 mg in sodium chloride 0.9 % 100 mL  chemo infusion  5 mg/kg (Treatment Plan Recorded) Intravenous Once Truitt Merle, MD       fluorouracil (ADRUCIL) 5,000 mg in sodium chloride 0.9 % 150 mL chemo infusion  2,400 mg/m2 (Treatment Plan Recorded) Intravenous 1 day or 1 dose Truitt Merle, MD       irinotecan (CAMPTOSAR) 220 mg in sodium chloride 0.9 % 500 mL chemo infusion  100 mg/m2 (Treatment Plan Recorded) Intravenous Once Truitt Merle, MD       leucovorin 896 mg in sodium chloride 0.9 % 250 mL infusion  400 mg/m2 (Treatment Plan Recorded) Intravenous Once Truitt Merle, MD        PHYSICAL EXAMINATION: ECOG PERFORMANCE STATUS: 1 - Symptomatic but completely ambulatory  Vitals:   07/11/22 0928  BP: (!) 151/48  Pulse: 70  Resp: 16  Temp: 98.3 F (36.8 C)  SpO2: 100%    Wt Readings from Last 3 Encounters:  07/11/22 217 lb (98.4 kg)  06/27/22 221 lb 9.6 oz (100.5 kg)  06/13/22 222 lb 8 oz (100.9 kg)    Lung: clear, no rhonchi or wheezes HEART: regular rate & rhythm and no murmurs and (+) b/l lower extremity edema, symmetric   LABORATORY DATA:  I have reviewed the data as listed    Latest Ref Rng & Units 07/11/2022    8:51 AM 06/27/2022    8:54 AM 06/13/2022    9:06 AM  CBC  WBC 4.0 - 10.5 K/uL 3.8  11.4  10.6   Hemoglobin 12.0 - 15.0 g/dL 9.9  9.5  9.5   Hematocrit 36.0 - 46.0 % 31.7  30.7  29.5   Platelets 150 - 400 K/uL 239  235  184         Latest Ref Rng & Units 07/11/2022    8:51 AM 06/27/2022    8:54 AM 06/13/2022    9:06 AM  CMP  Glucose 70 - 99 mg/dL 156  118  139   BUN 8 - 23 mg/dL '15  12  14   '$ Creatinine 0.44 - 1.00 mg/dL 0.64  0.61  0.79   Sodium 135 - 145 mmol/L 143  145  145   Potassium 3.5 - 5.1 mmol/L 4.0  3.5  3.4   Chloride 98 - 111 mmol/L 110  109  110   CO2 22 - 32 mmol/L '26  29  30   '$ Calcium 8.9 -  10.3 mg/dL 9.1  8.4  8.4   Total Protein 6.5 - 8.1 g/dL 6.2  6.0  5.7   Total Bilirubin 0.3 - 1.2 mg/dL 0.8  0.6  0.7   Alkaline Phos 38 - 126 U/L 90  138  151   AST 15 - 41 U/L '21  20  23   '$ ALT 0 - 44  U/L '14  13  15       '$ RADIOGRAPHIC STUDIES: I have personally reviewed the radiological images as listed and agreed with the findings in the report. No results found.    Orders Placed This Encounter  Procedures   CBC with Differential ([place] Only)    Standing Status:   Future    Standing Expiration Date:   09/05/2023   CMP ([place] only)    Standing Status:   Future    Standing Expiration Date:   09/05/2023   Total Protein, Urine dipstick    Standing Status:   Future    Standing Expiration Date:   09/05/2023   All questions were answered. The patient knows to call the clinic with any problems, questions or concerns. No barriers to learning was detected. The total time spent in the appointment was 30 minutes.     Truitt Merle, MD 07/11/2022   I, Maurine Simmering, CMA, am acting as scribe for Truitt Merle, MD.   I have reviewed the above documentation for accuracy and completeness, and I agree with the above.

## 2022-07-11 NOTE — Patient Instructions (Signed)
[facility]  Discharge Instructions: Thank you for choosing [facility] to provide your oncology and hematology care.   If you have a lab appointment with the [facility], please go directly to the [facility] and check in at the registration area.   Wear comfortable clothing and clothing appropriate for easy access to any Portacath or PICC line.   We strive to give you quality time with your provider. You may need to reschedule your appointment if you arrive late (15 or more minutes).  Arriving late affects you and other patients whose appointments are after yours.  Also, if you miss three or more appointments without notifying the office, you may be dismissed from the clinic at the provider's discretion.      For prescription refill requests, have your pharmacy contact our office and allow 72 hours for refills to be completed.    Today you received the following chemotherapy and/or immunotherapy agents irinotecan, leucovorin, fluorourcil      To help prevent nausea and vomiting after your treatment, we encourage you to take your nausea medication as directed.  BELOW ARE SYMPTOMS THAT SHOULD BE REPORTED IMMEDIATELY: *FEVER GREATER THAN 100.4 F (38 C) OR HIGHER *CHILLS OR SWEATING *NAUSEA AND VOMITING THAT IS NOT CONTROLLED WITH YOUR NAUSEA MEDICATION *UNUSUAL SHORTNESS OF BREATH *UNUSUAL BRUISING OR BLEEDING *URINARY PROBLEMS (pain or burning when urinating, or frequent urination) *BOWEL PROBLEMS (unusual diarrhea, constipation, pain near the anus) TENDERNESS IN MOUTH AND THROAT WITH OR WITHOUT PRESENCE OF ULCERS (sore throat, sores in mouth, or a toothache) UNUSUAL RASH, SWELLING OR PAIN  UNUSUAL VAGINAL DISCHARGE OR ITCHING   Items with * indicate a potential emergency and should be followed up as soon as possible or go to the Emergency Department if any problems should occur.  Please show the CHEMOTHERAPY ALERT CARD or  IMMUNOTHERAPY ALERT CARD at check-in to the Emergency Department and triage nurse.  Should you have questions after your visit or need to cancel or reschedule your appointment, please contact [facility]  Dept: [phone]  and follow the prompts.  Office hours are 8:00 a.m. to 4:30 p.m. Monday - Friday. Please note that voicemails left after 4:00 p.m. may not be returned until the following business day.  We are closed weekends and major holidays. You have access to a nurse at all times for urgent questions. Please call the main number to the clinic Dept: [phone] and follow the prompts.   For any non-urgent questions, you may also contact your provider using MyChart. We now offer e-Visits for anyone 45 and older to request care online for non-urgent symptoms. For details visit [email or web address].   Also download the MyChart app! Go to the app store, search "MyChart", open the app, select [facility], and log in with your MyChart username and password.

## 2022-07-12 DIAGNOSIS — K080 Exfoliation of teeth due to systemic causes: Secondary | ICD-10-CM | POA: Diagnosis not present

## 2022-07-13 ENCOUNTER — Inpatient Hospital Stay: Payer: Medicare Other

## 2022-07-13 VITALS — BP 158/52 | HR 66 | Temp 98.7°F | Resp 18

## 2022-07-13 DIAGNOSIS — Z5111 Encounter for antineoplastic chemotherapy: Secondary | ICD-10-CM | POA: Diagnosis not present

## 2022-07-13 DIAGNOSIS — C182 Malignant neoplasm of ascending colon: Secondary | ICD-10-CM

## 2022-07-13 MED ORDER — HEPARIN SOD (PORK) LOCK FLUSH 100 UNIT/ML IV SOLN
500.0000 [IU] | Freq: Once | INTRAVENOUS | Status: AC | PRN
  Administered 2022-07-13: 500 [IU]

## 2022-07-13 MED ORDER — SODIUM CHLORIDE 0.9% FLUSH
10.0000 mL | INTRAVENOUS | Status: DC | PRN
  Administered 2022-07-13: 10 mL

## 2022-07-13 MED ORDER — PEGFILGRASTIM-CBQV 6 MG/0.6ML ~~LOC~~ SOSY
6.0000 mg | PREFILLED_SYRINGE | Freq: Once | SUBCUTANEOUS | Status: AC
  Administered 2022-07-13: 6 mg via SUBCUTANEOUS
  Filled 2022-07-13: qty 0.6

## 2022-07-19 DIAGNOSIS — K080 Exfoliation of teeth due to systemic causes: Secondary | ICD-10-CM | POA: Diagnosis not present

## 2022-07-25 ENCOUNTER — Inpatient Hospital Stay: Payer: Medicare Other

## 2022-07-25 ENCOUNTER — Other Ambulatory Visit: Payer: Self-pay

## 2022-07-25 ENCOUNTER — Inpatient Hospital Stay: Payer: Medicare Other | Admitting: Physician Assistant

## 2022-07-25 VITALS — BP 158/58 | HR 53 | Temp 98.2°F | Resp 18

## 2022-07-25 VITALS — BP 153/62 | HR 65 | Temp 97.7°F | Resp 20 | Ht 65.0 in | Wt 213.4 lb

## 2022-07-25 DIAGNOSIS — C182 Malignant neoplasm of ascending colon: Secondary | ICD-10-CM

## 2022-07-25 DIAGNOSIS — Z5111 Encounter for antineoplastic chemotherapy: Secondary | ICD-10-CM

## 2022-07-25 DIAGNOSIS — C786 Secondary malignant neoplasm of retroperitoneum and peritoneum: Secondary | ICD-10-CM | POA: Diagnosis not present

## 2022-07-25 LAB — CBC WITH DIFFERENTIAL (CANCER CENTER ONLY)
Abs Immature Granulocytes: 0.37 10*3/uL — ABNORMAL HIGH (ref 0.00–0.07)
Basophils Absolute: 0.1 10*3/uL (ref 0.0–0.1)
Basophils Relative: 1 %
Eosinophils Absolute: 0.3 10*3/uL (ref 0.0–0.5)
Eosinophils Relative: 3 %
HCT: 36.2 % (ref 36.0–46.0)
Hemoglobin: 11.2 g/dL — ABNORMAL LOW (ref 12.0–15.0)
Immature Granulocytes: 3 %
Lymphocytes Relative: 22 %
Lymphs Abs: 2.5 10*3/uL (ref 0.7–4.0)
MCH: 33 pg (ref 26.0–34.0)
MCHC: 30.9 g/dL (ref 30.0–36.0)
MCV: 106.8 fL — ABNORMAL HIGH (ref 80.0–100.0)
Monocytes Absolute: 0.7 10*3/uL (ref 0.1–1.0)
Monocytes Relative: 6 %
Neutro Abs: 7.5 10*3/uL (ref 1.7–7.7)
Neutrophils Relative %: 65 %
Platelet Count: 201 10*3/uL (ref 150–400)
RBC: 3.39 MIL/uL — ABNORMAL LOW (ref 3.87–5.11)
RDW: 15.9 % — ABNORMAL HIGH (ref 11.5–15.5)
WBC Count: 11.4 10*3/uL — ABNORMAL HIGH (ref 4.0–10.5)
nRBC: 0 % (ref 0.0–0.2)

## 2022-07-25 LAB — CMP (CANCER CENTER ONLY)
ALT: 18 U/L (ref 0–44)
AST: 25 U/L (ref 15–41)
Albumin: 3.9 g/dL (ref 3.5–5.0)
Alkaline Phosphatase: 137 U/L — ABNORMAL HIGH (ref 38–126)
Anion gap: 8 (ref 5–15)
BUN: 12 mg/dL (ref 8–23)
CO2: 27 mmol/L (ref 22–32)
Calcium: 9.4 mg/dL (ref 8.9–10.3)
Chloride: 109 mmol/L (ref 98–111)
Creatinine: 0.72 mg/dL (ref 0.44–1.00)
GFR, Estimated: >60 mL/min (ref >60)
Glucose, Bld: 155 mg/dL — ABNORMAL HIGH (ref 70–99)
Potassium: 4.1 mmol/L (ref 3.5–5.1)
Sodium: 144 mmol/L (ref 135–145)
Total Bilirubin: 0.7 mg/dL (ref 0.3–1.2)
Total Protein: 6.3 g/dL — ABNORMAL LOW (ref 6.5–8.1)

## 2022-07-25 LAB — TOTAL PROTEIN, URINE DIPSTICK: Protein, ur: NEGATIVE mg/dL

## 2022-07-25 MED ORDER — HEPARIN SOD (PORK) LOCK FLUSH 100 UNIT/ML IV SOLN: 500.0000 [IU] | Freq: Once | INTRAVENOUS | Status: DC | PRN

## 2022-07-25 MED ORDER — SODIUM CHLORIDE 0.9 % IV SOLN
400.0000 mg/m2 | Freq: Once | INTRAVENOUS | Status: AC
  Administered 2022-07-25: 896 mg via INTRAVENOUS
  Filled 2022-07-25: qty 44.8

## 2022-07-25 MED ORDER — ATROPINE SULFATE 1 MG/ML IV SOLN
0.5000 mg | Freq: Once | INTRAVENOUS | Status: AC | PRN
  Administered 2022-07-25: 0.5 mg via INTRAVENOUS
  Filled 2022-07-25: qty 1

## 2022-07-25 MED ORDER — PALONOSETRON HCL INJECTION 0.25 MG/5ML
0.2500 mg | Freq: Once | INTRAVENOUS | Status: AC
  Administered 2022-07-25: 0.25 mg via INTRAVENOUS
  Filled 2022-07-25: qty 5

## 2022-07-25 MED ORDER — SODIUM CHLORIDE 0.9 % IV SOLN
5.0000 mg/kg | Freq: Once | INTRAVENOUS | Status: AC
  Administered 2022-07-25: 500 mg via INTRAVENOUS
  Filled 2022-07-25: qty 4

## 2022-07-25 MED ORDER — SODIUM CHLORIDE 0.9% FLUSH: 10.0000 mL | INTRAVENOUS | Status: DC | PRN

## 2022-07-25 MED ORDER — SODIUM CHLORIDE 0.9 % IV SOLN
2400.0000 mg/m2 | INTRAVENOUS | Status: DC
  Administered 2022-07-25: 5000 mg via INTRAVENOUS
  Filled 2022-07-25: qty 100

## 2022-07-25 MED ORDER — SODIUM CHLORIDE 0.9 % IV SOLN
100.0000 mg/m2 | Freq: Once | INTRAVENOUS | Status: AC
  Administered 2022-07-25: 220 mg via INTRAVENOUS
  Filled 2022-07-25: qty 11

## 2022-07-25 MED ORDER — SODIUM CHLORIDE 0.9 % IV SOLN
10.0000 mg | Freq: Once | INTRAVENOUS | Status: AC
  Administered 2022-07-25: 10 mg via INTRAVENOUS
  Filled 2022-07-25: qty 10

## 2022-07-25 MED ORDER — SODIUM CHLORIDE 0.9 % IV SOLN: INTRAVENOUS | Status: DC

## 2022-07-25 NOTE — Progress Notes (Signed)
Nutrition Follow-up:  Patient with right colon cancer.  Patient receiving folfiri and bevacizumab.  Met with patient during infusion.  Reports that appetite has improved.  She has been doing more yard work recently.  Ate a sausage biscuit this am for breakfast.  Yesterday ate cereal and banana for breakfast.  Skipped lunch and Dinner was Asian dish of noodles with egg, nuts (ate 1/2).  Lunch is usually salad with meat and cheese or ramen noodles with egg.  Likes cheese and crackers.     Medications: reviewed  Labs: reviewed  Anthropometrics:   Weight 213 lb 6.4 oz today  221 lb 9.6 oz on 2/26 222 lb 8 oz on 2/12 248 lb on 10/11   NUTRITION DIAGNOSIS: Inadequate oral intake improving    INTERVENTION:  Discussed ways to add more calories to help prevent weight loss (more dressing, more cheese, extras on foods).   Encouraged having at least 2 different foods or more at meal (ie soup and 1/2 sandwich or salad and fruit, etc).      MONITORING, EVALUATION, GOAL: weight trends, intake   NEXT VISIT: Monday, April 22nd during infusion  Maizee Reinhold B. Zenia Resides, [place], [place] Registered Dietitian [phone]

## 2022-07-25 NOTE — Progress Notes (Signed)
[place]   [phone] [phone]   Clinic Follow up Note   Patient Care Team: [name], MD as PCP - General (Family Medicine) Rolm Bookbinder, MD as Consulting Physician (General Surgery) Truitt Merle, MD as Consulting Physician (Hematology) Leighton Ruff, MD as Consulting Physician (General Surgery) Nicholas Lose, MD as Consulting Physician (Hematology and Oncology)  Date of Service:  07/25/2022  CHIEF COMPLAINT: f/u of right colon cancer and h/o DCIS     CURRENT THERAPY: Second line FOLFIRI/beva started on 04/04/22    SUMMARY OF ONCOLOGIC HISTORY: Oncology History Overview Note   Cancer Staging  Malignant neoplasm of female breast [facility]) Staging form: Breast, AJCC 7th Edition - Clinical stage from 06/11/2014: Stage 0 (Tis (DCIS), N0, M0) - Unsigned Staged by: Pathologist and managing physician Laterality: Left Estrogen receptor status: Positive Progesterone receptor status: Positive Stage used in treatment planning: Yes National guidelines used in treatment planning: Yes Type of national guideline used in treatment planning: NCCN - Pathologic stage from 07/03/2014: Stage Unknown (Tis (DCIS), NX, cM0) - Signed by Enid Cutter, MD on 07/10/2014 Staged by: Pathologist Laterality: Left Estrogen receptor status: Positive Progesterone receptor status: Positive Stage used in treatment planning: Yes National guidelines used in treatment planning: Yes Type of national guideline used in treatment planning: NCCN Staging comments: Staged on final lumpectomy specimen by Dr. Donato Heinz.  Right colon cancer Staging form: Colon and Rectum, AJCC 8th Edition - Pathologic stage from 11/01/2019: Stage IIIB (pT3, pN2a, cM0) - Signed by Truitt Merle, MD on 11/29/2019 Stage prefix: Initial diagnosis Histologic grading system: 4 grade system Histologic grade (G): G2 Residual tumor (R): R0 - None Tumor deposits (TD): Absent Perineural invasion (PNI):  Absent Microsatellite instability (MSI): Stable KRAS mutation: Unknown NRAS mutation: Unknown BRAF mutation: Unknown     Malignant neoplasm of female breast ([place])  05/29/2014 Initial Biopsy   Left breast needle core biopsy: Grade 2, DCIS with calcs. ER+ (100%), PR+ (96%).    06/04/2014 Initial Diagnosis   Left breast DCIS with calcifications, ER 100%, PR 96%   06/10/2014 Breast MRI   Left breast: 2.4 x 1.3 x 1.1 cm area of patchy non-mass enhancement upper outer quadrant includes postbiopsy seroma; Right breast: 1.2 cm previously biopsied stable benign fibroadenoma   06/12/2014 Procedure   Genetic counseling/testing: Identified 1 VUS on CHEK2 gene. Remainder of 17 gene panel tested negative and included: ATM, BARD1, BRCA 1/2, BRIP1, CDH1, CHEK2, EPCAM, MLH1, MSH2, MSH6, NBN, NF1, PALB2, PTEN, RAD50, RAD51C, RAD51D, STK11, and TP53.    07/01/2014 Surgery   Left breast lumpectomy ([place]): Grade 1, DCIS, spanning 2.3 cm, 1 mm margin, ER 100%, PR 96%   07/31/2014 - 08/28/2014 Radiation Therapy   Adjuvant RT completed Pablo Ledger). Left breast: Total dose 42.5 Gy over 17 fractions. Left breast boost: Total dose 7.5 Gy over 3 fractions.    09/14/2014 - 01/13/2020 Anti-estrogen oral therapy   Anastrazole 1mg  daily. Planned duration of treatment: 5 years [place]). Completed in 01/2020.    09/25/2014 Survivorship   Survivorship Care Plan given to patient and reviewed with her in person.    03/02/2021 Imaging   CT CAP  IMPRESSION: 1. No findings of active/recurrent malignancy. Partial right hemicolectomy. 2. Endometrial stripe remains mildly thickened, but endometrial biopsy in May was negative for malignancy. 3. Progressive endplate sclerosis and endplate irregularity at T2-3, probably due to degenerative endplate findings. If the has referable upper thoracic pain/symptoms then thoracic spine MRI could be used for further workup. 4.  Other imaging findings of potential clinical significance:  Mild cardiomegaly. Aortic Atherosclerosis (ICD10-I70.0). Mild mitral valve calcification. Postoperative findings in the left breast with adjacent radiation port anteriorly in the left upper lobe. Tiny pulmonary nodules in the left lower lobe are unchanged from earliest available comparison of 10/17/2019 and probably benign although may merit surveillance. Multilevel lumbar impingement. Mild pelvic floor laxity.   Malignant neoplasm of ascending colon ([place])  09/19/2019 Imaging   CT AP W contrast 09/19/19  IMPRESSION Fullness in the cecum, cannot exclude a mass. No evidence for metastatic disease is identified.    09/24/2019 Procedure   Colonoscopy by Dr Eber Jones 09/24/19 IMPRESSION 1. The colon was redundant  2. Mild diverticulosis was noted through the entire examined colon 3. Single 18mm polyp was found in the ascending colon; polypectomy was performed using snare cautery and biopsy forceps 4. Mild diverticulosis was notes in the descending colon and sigmoid colon.  5. Single polyp was found in the sigmoid colon, polypectomy was performed with cold forceps.  6. Single polyp was found in the rectosigmoid colon; polypectomy was performed with cold snare  7. Small internal hemorrhoids  8. Large mass was found at the cecum; multiple biopsies of the area were performed using cold forceps; injection (tattooing) was performed distal to the mass.    09/24/2019 Initial Biopsy   INTERPRETATION AND DIAGNOSIS:  A. Cecum, biopsy:  Invasive moderately differentiated adenocarcinoma.  see comment  B. Polyp @ ascending colon, polypectomy:  Tubular Adenoma  C. Polyp @ sigmoid colon Polypectomy:  hyperplastic polyp.  D. Polyp @ rectosigmoid colon, Polypectomy:  Hyperplastic Polyp      10/16/2019 Imaging   CT Chest IMPRESSION: 1. Multiple small pulmonary nodules measuring 5 mm or less in size in the lungs. These are nonspecific and are typically considered statistically likely benign. However, given  the patient's history of primary malignancy, close attention on follow-up studies is recommended to ensure stability. 2. Aortic atherosclerosis, in addition to right coronary artery disease. Assessment for potential risk factor modification, dietary therapy or pharmacologic therapy may be warranted, if clinically indicated. 3. There are calcifications of the aortic valve and mitral annulus. Echocardiographic correlation for evaluation of potential valvular dysfunction may be warranted if clinically indicated. 4. Small hiatal hernia.   Aortic Atherosclerosis (ICD10-I70.0).   11/01/2019 Initial Diagnosis   Colon cancer ([place])   11/01/2019 Surgery   LAPAROSCOPIC PARTIAL COLECTOMY by Dr Marcello Moores and Dr Johney Maine   11/01/2019 Pathology Results   FINAL MICROSCOPIC DIAGNOSIS:   A. COLON, PROXIMAL RIGHT, COLECTOMY:  - Invasive colonic adenocarcinoma, 5 cm.  - Tumor invades through the muscularis propria into pericolonic tissues.   - Margins of resection are not involved.  - Metastatic carcinoma in (5) of (13) lymph nodes.  - See oncology table.    MSI Stable  Mismatch repair normal  MLH1 - Preserved nuclear expression (greater 50% tumor expression) MSH2 - Preserved nuclear expression (greater 50% tumor expression) MSH6 - Preserved nuclear expression (greater 50% tumor expression) PMS2 - Preserved nuclear expression (greater 50% tumor expression)   11/01/2019 Cancer Staging   Staging form: Colon and Rectum, AJCC 8th Edition - Pathologic stage from 11/01/2019: Stage IIIB (pT3, pN2a, cM0) - Signed by Truitt Merle, MD on 11/29/2019   12/10/2019 Procedure   PAC placed 12/10/19   12/17/2019 - 06/01/2020 Chemotherapy   FOLFOX q2weeks starting in 2 weeks starting 12/17/19. Held 01/27/20-02/10/20 due to b/l PE. Oxaliplatin held C11-12 due to neuropathy. Completed on 06/01/20   03/02/2021 Imaging  CT CAP  IMPRESSION: 1. No findings of active/recurrent malignancy. Partial right hemicolectomy. 2. Endometrial  stripe remains mildly thickened, but endometrial biopsy in May was negative for malignancy. 3. Progressive endplate sclerosis and endplate irregularity at T2-3, probably due to degenerative endplate findings. If the has referable upper thoracic pain/symptoms then thoracic spine MRI could be used for further workup. 4. Other imaging findings of potential clinical significance: Mild cardiomegaly. Aortic Atherosclerosis (ICD10-I70.0). Mild mitral valve calcification. Postoperative findings in the left breast with adjacent radiation port anteriorly in the left upper lobe. Tiny pulmonary nodules in the left lower lobe are unchanged from earliest available comparison of 10/17/2019 and probably benign although may merit surveillance. Multilevel lumbar impingement. Mild pelvic floor laxity.   08/26/2021 Imaging   EXAM: CT CHEST, ABDOMEN, AND PELVIS WITH CONTRAST  IMPRESSION: 1. Stable examination without new or progressive findings to suggest local recurrence or metastatic disease within the chest, abdomen, or pelvis. 2. Hepatomegaly with hepatic steatosis. 3. Sigmoid colonic diverticulosis without findings of acute diverticulitis. 4. Similar prominent endplate sclerosis and irregularity at T2-T3 is most consistent with Modic type endplate degenerative changes. However, if patient has referable upper thoracic pain consider further workup with thoracic spine MRI. 5. Similar thickening of the endometrial stripe measuring up to 8 mm, which was previously biopsied with results negative for malignancy. 6. Aortic Atherosclerosis (ICD10-I70.0).   11/10/2021 PET scan   IMPRESSION: 1. LEFT ovary is increased in size and is intensely hypermetabolic. While physiologic hypermetabolic ovarian tissue is not uncommon, the enlargement and asymmetric activity warrants further evaluation. Consider contrast pelvic MRI vs tissue sampling. 2. No evidence of metastatic colorectal carcinoma otherwise. 3. Post RIGHT  hemicolectomy anatomy. 4. Evidence of radiation change in the LEFT upper lobe (remote breast cancer).     12/22/2021 Relapse/Recurrence    FINAL MICROSCOPIC DIAGNOSIS:   A. LEFT OVARY AND FALLOPIAN TUBE, SALPINGO OOPHORECTOMY:  - Metastatic moderately differentiated colonic adenocarcinoma involving left ovary  - Focal ovarian stromal calcification  - Segment of benign fallopian tube   B. UTERUS WITH RIGHT FALLOPIAN TUBE AND OVARY, HYSTERECTOMY AND SALPINGO-OOPHORECTOMY:  - Metastatic moderately differentiated colonic adenocarcinoma involving right ovary  - Focal invasive extensive adenomyosis  - Benign endometrial polyps  - Benign proliferative phase endometrium  - Hydrosalpinx of right fallopian tube  - Focal ovarian stromal calcification   C. PERITONEAL DEPOSITS, ANTERIOR CUL DE SAC, BIOPSY:  - Metastatic mucinous adenocarcinoma, consistent with colorectal primary    COMMENT:  Immunohistochemical stains show that the tumor cells are positive for CK20 and CDX2 while they are negative for CK7 and PAX8, consistent with above interpretation.    01/24/2022 - 03/23/2022 Chemotherapy   Patient is on Treatment Plan : COLORECTAL FOLFOX q14d x 3 months     04/01/2022 Imaging   CT AP IMPRESSION: 1. New omental metastatic disease. 2. Vague hypoattenuating lesion in the inferior right hepatic lobe is new and also worrisome for metastatic disease. 3. Similar small right lower lobe nodules. Recommend continued attention on follow-up. 4. Steatotic enlarged liver. 5.  Aortic atherosclerosis (ICD10-I70.0).   04/06/2022 -  Chemotherapy   Patient is on Treatment Plan : COLORECTAL FOLFIRI + Bevacizumab q14d     04/21/2022 Imaging    IMPRESSION: 1. Stable chest CT. No evidence of metastatic disease. 2. Stable small pulmonary nodules, considered benign based on stability. 3. Stable postsurgical changes in the left breast and anterior left upper lobe radiation changes. 4. Aortic  Atherosclerosis (ICD10-I70.0) and Emphysema (ICD10-J43.9).   04/21/2022 Imaging  IMPRESSION: 1. Small enhancing lesion inferiorly in the right hepatic lobe is typical of metastatic disease and stable from recent abdominal CT. No other definite liver lesions are identified. Mild hepatic steatosis. 2. No other significant abdominal findings. 3. Omental nodularity seen on CT is not well visualized by MRI.    Imaging     06/23/2022 Imaging    IMPRESSION: 1. Hypodense lesion of the inferior right lobe of the liver, hepatic segment VI is diminished in size, consistent with treatment response. 2. Tiny peritoneal and omental nodules identified by prior examination are diminished in size, consistent with treatment response. 3. Multiple tiny pulmonary nodules unchanged, most likely benign and incidental, however continued attention on follow-up warranted in the setting of known metastatic disease. 4. No evidence of new metastatic disease in the chest, abdomen, or pelvis. 5. Status post partial right hemicolectomy and reanastomosis. 6. Diffuse mosaic attenuation of the airspaces, consistent with small airways disease. 7. Hepatomegaly.      INTERVAL HISTORY:  Kim Castro is here for a follow up of right colon cancer and h/o DCIS    She was last seen by Dr. Burr Medico on 07/11/2022. She presents to the clinic alone.   Ms. Duy reports that she is tolerating treatment since the dose reduction. Her energy and appetite are stable. She continues to complete her daily ADLs on her own. Although she has lost 4 lbs since the last visit, she feels that she is eating adequate amount of food. She has rare episodes of nausea without vomiting episodes. Her bowel movements have normalized with soft stools. She denies any episodes of diarrhea. She denies easy bruising or signs of bleeding. She does have some shortness of breath with exertion but none at rest. She denies fevers, chills, sweats, chest  pain or cough. She has no other complaints.   All other systems were reviewed with the patient and are negative.  MEDICAL HISTORY:  Past Medical History:  Diagnosis Date   Aortic atherosclerosis (HCC)    Arthritis    feet, lower back   Basal cell carcinoma    arm   Breast cancer of upper-outer quadrant of left female breast ([place]) 06/04/2014   Cataract    immature on the left   Colon cancer ([place]) 08/2019   Diabetes mellitus without complication (HCC)    Diverticulosis    Dizziness    > 49yrs ago;took Antivert    Family history of anesthesia complication    sister slow to wake up with anesthesia   Family history of breast cancer    Family history of colon cancer    Family history of uterine cancer    GERD (gastroesophageal reflux disease)    takes occasional TUMs   History of bronchitis    > 51yrs ago   History of colon polyps    History of hiatal hernia    Small noted on CT   History of pulmonary embolus (PE)    Hypertension    takes Losartan daily and HCTZ   Iron deficiency anemia    Joint pain    Numbness    to toes on each foot   Peripheral neuropathy    feet and toes   Personal history of radiation therapy    Pulmonary nodules    Noted on CT   Radiation 07/31/14-08/28/14   Left Breast 20 fxs   Seasonal allergies    takes Claritin prn   Urinary frequency    Vitamin D deficiency    takes  VIt D daily    SURGICAL HISTORY: Past Surgical History:  Procedure Laterality Date   BREAST BIOPSY Bilateral    BREAST LUMPECTOMY Left    BREAST LUMPECTOMY WITH RADIOACTIVE SEED LOCALIZATION Left 07/01/2014   Procedure: LEFT BREAST LUMPECTOMY WITH RADIOACTIVE SEED LOCALIZATION;  Surgeon: Excell Seltzer, MD;  Location: [place];  Service: General;  Laterality: Left;   CATARACT EXTRACTION Right    COLONOSCOPY  09/24/2019   [place]   LAPAROSCOPIC PARTIAL COLECTOMY N/A 11/01/2019   Procedure: LAPAROSCOPIC PARTIAL COLECTOMY;  Surgeon: Leighton Ruff, MD;   Location: [facility];  Service: General;  Laterality: N/A;   POLYPECTOMY     PORTACATH PLACEMENT N/A 12/10/2019   Procedure: INSERTION PORT-A-CATH ULTRASOUND GUIDED IN RIGHT IJ;  Surgeon: Leighton Ruff, MD;  Location: [facility];  Service: General;  Laterality: N/A;   ROBOTIC ASSISTED TOTAL HYSTERECTOMY Bilateral 12/22/2021   Procedure: XI ROBOTIC ASSISTED TOTAL HYSTERECTOMY WITH BILATERAL SALPINGO OOPHORECTOMY;  Surgeon: [name], MD;  Location: [facility];  Service: Gynecology;  Laterality: Bilateral;   TOTAL KNEE ARTHROPLASTY Left 10/24/2012   Procedure: TOTAL KNEE ARTHROPLASTY;  Surgeon: Kerin Salen, MD;  Location: [place];  Service: Orthopedics;  Laterality: Left;   TOTAL KNEE ARTHROPLASTY Right 01/09/2013   Procedure: TOTAL KNEE ARTHROPLASTY;  Surgeon: Kerin Salen, MD;  Location: [place];  Service: Orthopedics;  Laterality: Right;   TUBAL LIGATION      I have reviewed the social history and family history with the patient and they are unchanged from previous note.  ALLERGIES:  is allergic to oxycodone.  MEDICATIONS:  Current Outpatient Medications  Medication Sig Dispense Refill   acetaminophen (TYLENOL) 325 MG tablet Take 650 mg by mouth every 6 (six) hours as needed for moderate pain.     b complex vitamins capsule Take 1 capsule by mouth daily.     Cholecalciferol (VITAMIN D) 2000 UNITS CAPS Take 2,000 Units by mouth daily.      Ibuprofen 200 MG CAPS Take by mouth.     loratadine (CLARITIN) 10 MG tablet Take 10 mg by mouth daily as needed for allergies.     losartan (COZAAR) 25 MG tablet Take 1 tablet (25 mg total) by mouth daily. 90 tablet 3   metFORMIN (GLUCOPHAGE) 500 MG tablet Take 1 tablet (500 mg total) by mouth in the morning and at bedtime. 180 tablet 3   Multiple Vitamins-Minerals (MULTIVITAMIN WITH MINERALS) tablet Take 1 tablet by mouth daily.     omeprazole (PRILOSEC) 20 MG capsule Take 20 mg by mouth daily before breakfast.      potassium chloride (KLOR-CON) 10 MEQ  tablet Take 1 tablet by mouth once daily 30 tablet 0   pravastatin (PRAVACHOL) 10 MG tablet Take 1 tablet (10 mg total) by mouth daily. 90 tablet 3   pregabalin (LYRICA) 25 MG capsule Take 1 capsule (25 mg total) by mouth 2 (two) times daily. 60 capsule 3   No current facility-administered medications for this visit.   Facility-Administered Medications Ordered in Other Visits  Medication Dose Route Frequency Provider Last Rate Last Admin   0.9 %  sodium chloride infusion   Intravenous Continuous Lincoln Brigham, PA-C 20 mL/hr at 07/25/22 0929 New Bag at 07/25/22 0929   atropine injection 0.5 mg  0.5 mg Intravenous Once PRN Truitt Merle, MD       bevacizumab-awwb (MVASI) 500 mg in sodium chloride 0.9 % 100 mL chemo infusion  5 mg/kg (Treatment Plan Recorded) Intravenous Once Drake,  Krista Blue, MD       dexamethasone (DECADRON) 10 mg in sodium chloride 0.9 % 50 mL IVPB  10 mg Intravenous Once Truitt Merle, MD       fluorouracil (ADRUCIL) 5,000 mg in sodium chloride 0.9 % 150 mL chemo infusion  2,400 mg/m2 (Treatment Plan Recorded) Intravenous 1 day or 1 dose Truitt Merle, MD       heparin lock flush 100 unit/mL  500 Units Intracatheter Once PRN Truitt Merle, MD       irinotecan (CAMPTOSAR) 220 mg in sodium chloride 0.9 % 500 mL chemo infusion  100 mg/m2 (Treatment Plan Recorded) Intravenous Once Truitt Merle, MD       leucovorin 896 mg in sodium chloride 0.9 % 250 mL infusion  400 mg/m2 (Treatment Plan Recorded) Intravenous Once Truitt Merle, MD       palonosetron (ALOXI) injection 0.25 mg  0.25 mg Intravenous Once Truitt Merle, MD       sodium chloride flush (NS) 0.9 % injection 10 mL  10 mL Intracatheter PRN Truitt Merle, MD        PHYSICAL EXAMINATION: ECOG PERFORMANCE STATUS: 1 - Symptomatic but completely ambulatory  Vitals:   07/25/22 0839  BP: (!) 153/62  Pulse: 65  Resp: 20  Temp: 97.7 F (36.5 C)  SpO2: 100%    Wt Readings from Last 3 Encounters:  07/25/22 213 lb 6.4 oz (96.8 kg)  07/11/22 217 lb (98.4 kg)   06/27/22 221 lb 9.6 oz (100.5 kg)    Constitutional: Oriented to person, place, and time and well-developed, well-nourished, and in no distress.  HENT:  Head: Normocephalic and atraumatic.  Eyes: Conjunctivae are normal. Right eye exhibits no discharge. Left eye exhibits no discharge. No scleral icterus.  Neck: Normal range of motion. Neck supple.   Cardiovascular: Normal rate, regular rhythm, normal heart sounds Pulmonary/Chest: Effort normal and breath sounds normal. No respiratory distress. No wheezes. No rales.  Abdominal: Soft. Bowel sounds are normal. Exhibits no distension and no mass. There is no tenderness.  Musculoskeletal: Normal range of motion. Exhibits trace bilateral lower extremity edema.  Lymphadenopathy: No cervical adenopathy.  Neurological: Alert and oriented to person, place, and time. Exhibits normal muscle tone. Gait normal. Coordination normal.  Skin: Skin is warm and dry. No rash noted. Not diaphoretic. No erythema. No pallor.  Psychiatric: Mood, memory and judgment normal.    LABORATORY DATA:  I have reviewed the data as listed    Latest Ref Rng & Units 07/25/2022    8:00 AM 07/11/2022    8:51 AM 06/27/2022    8:54 AM  CBC  WBC 4.0 - 10.5 K/uL 11.4  3.8  11.4   Hemoglobin 12.0 - 15.0 g/dL 11.2  9.9  9.5   Hematocrit 36.0 - 46.0 % 36.2  31.7  30.7   Platelets 150 - 400 K/uL 201  239  235         Latest Ref Rng & Units 07/25/2022    8:00 AM 07/11/2022    8:51 AM 06/27/2022    8:54 AM  CMP  Glucose 70 - 99 mg/dL 155  156  118   BUN 8 - 23 mg/dL 12  15  12    Creatinine 0.44 - 1.00 mg/dL 0.72  0.64  0.61   Sodium 135 - 145 mmol/L 144  143  145   Potassium 3.5 - 5.1 mmol/L 4.1  4.0  3.5   Chloride 98 - 111 mmol/L 109  110  109  CO2 22 - 32 mmol/L 27  26  29    Calcium 8.9 - 10.3 mg/dL 9.4  9.1  8.4   Total Protein 6.5 - 8.1 g/dL 6.3  6.2  6.0   Total Bilirubin 0.3 - 1.2 mg/dL 0.7  0.8  0.6   Alkaline Phos 38 - 126 U/L 137  90  138   AST 15 - 41 U/L 25   21  20    ALT 0 - 44 U/L 18  14  13        RADIOGRAPHIC STUDIES: I have personally reviewed the radiological images as listed and agreed with the findings in the report. No results found.   ASSESSMENT:  Tamanika Higham is a 77 y.o. female with metastatic colon cancer presents to the clinic for a follow up.   Malignant neoplasm of ascending colon (HCC) pT3N2aM0 stage IIB, MSS, metastatic to b/l ovaries and peritoneum and liver, KRAS mutation G12V (+)  -Initially diagnosed in 08/2019, s/p resection with Dr Marcello Moores on 11/01/19. Path showed overall Stage IIIB cancer.  -s/p 6 months adjuvant FOLFOX, completed 06/01/20, oxaliplatin held for final 2 cycles due to neuropathy.  -s/p BSO and hysterectomy on 12/22/21 with Dr. Berline Lopes. Path revealed metastatic moderately differentiated colonic adenocarcinoma involving both ovaries and peritoneum -she restarted FOLFOX on 01/24/22. -Due to disease progression, chemotherapy changed to second line FOLFIRI and bevacizumab on April 06, 2022. She has been tolerating moderately well, better now with dose reduction  -Restaging CT scan from June 23, 2022 showed partial response in liver and peritoneal metastasis, no other new lesions.    Malignant neoplasm of female breast [facility]) -Dx in 05/2014, s/p left lumpectomy with Dr Excell Seltzer, adjuvant RT with Dr Pablo Ledger, and Anastrozole 08/2014 - 01/13/20 under Dr Lindi Adie.  -DEXA 09/11/20 T score +1.2 normal.  -most recent mammogram on 10/04/21 was negative. Will continue yearly   Peripheral neuropathy due to chemotherapy ([place]) -Secondary to oxaliplatin -She is on Lyrica and B complex, continue to monitor.      PLAN: -Due for Cycle 9, Day 1 of FOLFIRI plus Bevacizumab -Labs from today were reviewed and adequate for treatment. WBC 11.4, Hgb 11.2, Plt 201K, Creatinine and LFTs normal.  -Tolerating treatment well so okay to proceed without any dose modifications.  -RTC in 2 weeks with labs and follow up with Dr. Burr Medico  before Cycle 10.   No orders of the defined types were placed in this encounter.  All questions were answered. The patient knows to call the clinic with any problems, questions or concerns. No barriers to learning was detected.  I have spent a total of 30 minutes minutes of face-to-face and non-face-to-face time, preparing to see the patient, performing a medically appropriate examination, counseling and educating the patient, documenting clinical information in the electronic health record, and care coordination.   Dede Query PA-C Dept of Hematology and [place] at [facility] Phone: [phone]

## 2022-07-27 ENCOUNTER — Other Ambulatory Visit: Payer: Self-pay

## 2022-07-27 ENCOUNTER — Inpatient Hospital Stay: Payer: Medicare Other

## 2022-07-27 VITALS — BP 141/74 | HR 59 | Temp 98.8°F | Resp 18

## 2022-07-27 DIAGNOSIS — C182 Malignant neoplasm of ascending colon: Secondary | ICD-10-CM

## 2022-07-27 DIAGNOSIS — Z5111 Encounter for antineoplastic chemotherapy: Secondary | ICD-10-CM | POA: Diagnosis not present

## 2022-07-27 MED ORDER — HEPARIN SOD (PORK) LOCK FLUSH 100 UNIT/ML IV SOLN
500.0000 [IU] | Freq: Once | INTRAVENOUS | Status: AC | PRN
  Administered 2022-07-27: 500 [IU]

## 2022-07-27 MED ORDER — SODIUM CHLORIDE 0.9% FLUSH
10.0000 mL | INTRAVENOUS | Status: DC | PRN
  Administered 2022-07-27: 10 mL

## 2022-07-30 ENCOUNTER — Other Ambulatory Visit: Payer: Self-pay | Admitting: Hematology

## 2022-08-02 ENCOUNTER — Other Ambulatory Visit: Payer: Self-pay

## 2022-08-05 MED FILL — Dexamethasone Sodium Phosphate Inj 100 MG/10ML: INTRAMUSCULAR | Qty: 1 | Status: AC

## 2022-08-07 NOTE — Assessment & Plan Note (Signed)
-  Dx in 05/2014, s/p left lumpectomy with Dr Hoxworth, adjuvant RT with Dr Wentworth, and Anastrozole 08/2014 - 01/13/20 under Dr Gudena.  -DEXA 09/11/20 T score +1.2 normal.  -most recent mammogram on 10/04/21 was negative. Will continue yearly  

## 2022-08-07 NOTE — Assessment & Plan Note (Signed)
[id number] stage IIB, MSS, metastatic to b/l ovaries and peritoneum and liver, KRAS mutation G12V (+)  -Initially diagnosed in 08/2019, s/p resection with Dr Thomas on 11/01/19. Path showed overall Stage IIIB cancer.  -s/p 6 months adjuvant FOLFOX, completed 06/01/20, oxaliplatin held for final 2 cycles due to neuropathy.  -s/p BSO and hysterectomy on 12/22/21 with Dr. Tucker. Path revealed metastatic moderately differentiated colonic adenocarcinoma involving both ovaries and peritoneum -she restarted FOLFOX on 01/24/22. -Due to disease progression, chemotherapy changed to second line FOLFIRI and bevacizumab on April 06, 2022. She has been tolerating moderately well, better now with dose reduction  -Restaging CT scan from June 23, 2022 showed partial response in liver and peritoneal metastasis, no other new lesions.  I reviewed the images and discussed the results with patient.  She is pleased. -She has been tolerating chemotherapy much better lately due to the dose reduction.  We discussed maintenance therapy option in future  

## 2022-08-07 NOTE — Assessment & Plan Note (Signed)
-  Secondary to oxaliplatin -She is on Lyrica and B complex, continue to monitor.  Overall stable.   

## 2022-08-08 ENCOUNTER — Inpatient Hospital Stay: Payer: Medicare Other | Attending: Hematology | Admitting: Hematology

## 2022-08-08 ENCOUNTER — Inpatient Hospital Stay: Payer: Medicare Other

## 2022-08-08 ENCOUNTER — Encounter: Payer: Self-pay | Admitting: Hematology

## 2022-08-08 VITALS — BP 144/62 | HR 67 | Temp 98.0°F | Resp 18 | Ht 65.0 in | Wt 214.4 lb

## 2022-08-08 VITALS — BP 111/59 | HR 55 | Temp 97.9°F | Resp 17

## 2022-08-08 DIAGNOSIS — T451X5A Adverse effect of antineoplastic and immunosuppressive drugs, initial encounter: Secondary | ICD-10-CM

## 2022-08-08 DIAGNOSIS — Z5111 Encounter for antineoplastic chemotherapy: Secondary | ICD-10-CM | POA: Insufficient documentation

## 2022-08-08 DIAGNOSIS — Z90710 Acquired absence of both cervix and uterus: Secondary | ICD-10-CM | POA: Diagnosis not present

## 2022-08-08 DIAGNOSIS — C786 Secondary malignant neoplasm of retroperitoneum and peritoneum: Secondary | ICD-10-CM | POA: Diagnosis not present

## 2022-08-08 DIAGNOSIS — Z90722 Acquired absence of ovaries, bilateral: Secondary | ICD-10-CM | POA: Diagnosis not present

## 2022-08-08 DIAGNOSIS — C7963 Secondary malignant neoplasm of bilateral ovaries: Secondary | ICD-10-CM | POA: Insufficient documentation

## 2022-08-08 DIAGNOSIS — Z95828 Presence of other vascular implants and grafts: Secondary | ICD-10-CM

## 2022-08-08 DIAGNOSIS — Z66 Do not resuscitate: Secondary | ICD-10-CM | POA: Diagnosis not present

## 2022-08-08 DIAGNOSIS — C50919 Malignant neoplasm of unspecified site of unspecified female breast: Secondary | ICD-10-CM

## 2022-08-08 DIAGNOSIS — C182 Malignant neoplasm of ascending colon: Secondary | ICD-10-CM

## 2022-08-08 DIAGNOSIS — C787 Secondary malignant neoplasm of liver and intrahepatic bile duct: Secondary | ICD-10-CM | POA: Insufficient documentation

## 2022-08-08 DIAGNOSIS — Z853 Personal history of malignant neoplasm of breast: Secondary | ICD-10-CM | POA: Insufficient documentation

## 2022-08-08 DIAGNOSIS — Z170 Estrogen receptor positive status [ER+]: Secondary | ICD-10-CM

## 2022-08-08 DIAGNOSIS — G620 Drug-induced polyneuropathy: Secondary | ICD-10-CM

## 2022-08-08 DIAGNOSIS — Z5189 Encounter for other specified aftercare: Secondary | ICD-10-CM | POA: Insufficient documentation

## 2022-08-08 LAB — CMP (CANCER CENTER ONLY)
ALT: 18 U/L (ref 0–44)
AST: 24 U/L (ref 15–41)
Albumin: 4 g/dL (ref 3.5–5.0)
Alkaline Phosphatase: 100 U/L (ref 38–126)
Anion gap: 9 (ref 5–15)
BUN: 21 mg/dL (ref 8–23)
CO2: 24 mmol/L (ref 22–32)
Calcium: 9.5 mg/dL (ref 8.9–10.3)
Chloride: 109 mmol/L (ref 98–111)
Creatinine: 0.9 mg/dL (ref 0.44–1.00)
GFR, Estimated: >60 mL/min (ref >60)
Glucose, Bld: 193 mg/dL — ABNORMAL HIGH (ref 70–99)
Potassium: 4.1 mmol/L (ref 3.5–5.1)
Sodium: 142 mmol/L (ref 135–145)
Total Bilirubin: 0.8 mg/dL (ref 0.3–1.2)
Total Protein: 6.4 g/dL — ABNORMAL LOW (ref 6.5–8.1)

## 2022-08-08 LAB — CBC WITH DIFFERENTIAL (CANCER CENTER ONLY)
Abs Immature Granulocytes: 0.02 10*3/uL (ref 0.00–0.07)
Basophils Absolute: 0 10*3/uL (ref 0.0–0.1)
Basophils Relative: 1 %
Eosinophils Absolute: 0.3 10*3/uL (ref 0.0–0.5)
Eosinophils Relative: 6 %
HCT: 33.3 % — ABNORMAL LOW (ref 36.0–46.0)
Hemoglobin: 10.5 g/dL — ABNORMAL LOW (ref 12.0–15.0)
Immature Granulocytes: 1 %
Lymphocytes Relative: 37 %
Lymphs Abs: 1.5 10*3/uL (ref 0.7–4.0)
MCH: 32.8 pg (ref 26.0–34.0)
MCHC: 31.5 g/dL (ref 30.0–36.0)
MCV: 104.1 fL — ABNORMAL HIGH (ref 80.0–100.0)
Monocytes Absolute: 0.4 10*3/uL (ref 0.1–1.0)
Monocytes Relative: 9 %
Neutro Abs: 1.9 10*3/uL (ref 1.7–7.7)
Neutrophils Relative %: 46 %
Platelet Count: 233 10*3/uL (ref 150–400)
RBC: 3.2 MIL/uL — ABNORMAL LOW (ref 3.87–5.11)
RDW: 16 % — ABNORMAL HIGH (ref 11.5–15.5)
WBC Count: 4.1 10*3/uL (ref 4.0–10.5)
nRBC: 0 % (ref 0.0–0.2)

## 2022-08-08 LAB — TOTAL PROTEIN, URINE DIPSTICK: Protein, ur: NEGATIVE mg/dL

## 2022-08-08 LAB — CEA (IN HOUSE-CHCC): CEA (CHCC-In House): 6.85 ng/mL — ABNORMAL HIGH (ref 0.00–5.00)

## 2022-08-08 MED ORDER — SODIUM CHLORIDE 0.9 % IV SOLN
100.0000 mg/m2 | Freq: Once | INTRAVENOUS | Status: AC
  Administered 2022-08-08: 220 mg via INTRAVENOUS
  Filled 2022-08-08: qty 11

## 2022-08-08 MED ORDER — PALONOSETRON HCL INJECTION 0.25 MG/5ML
0.2500 mg | Freq: Once | INTRAVENOUS | Status: AC
  Administered 2022-08-08: 0.25 mg via INTRAVENOUS
  Filled 2022-08-08: qty 5

## 2022-08-08 MED ORDER — SODIUM CHLORIDE 0.9 % IV SOLN
5.0000 mg/kg | Freq: Once | INTRAVENOUS | Status: AC
  Administered 2022-08-08: 500 mg via INTRAVENOUS
  Filled 2022-08-08: qty 4

## 2022-08-08 MED ORDER — SODIUM CHLORIDE 0.9 % IV SOLN
400.0000 mg/m2 | Freq: Once | INTRAVENOUS | Status: AC
  Administered 2022-08-08: 896 mg via INTRAVENOUS
  Filled 2022-08-08: qty 44.8

## 2022-08-08 MED ORDER — ONDANSETRON HCL 8 MG PO TABS
8.0000 mg | ORAL_TABLET | Freq: Three times a day (TID) | ORAL | 1 refills | Status: DC | PRN
Start: 2022-08-08 — End: 2024-03-12

## 2022-08-08 MED ORDER — SODIUM CHLORIDE 0.9 % IV SOLN: INTRAVENOUS | Status: DC

## 2022-08-08 MED ORDER — ATROPINE SULFATE 1 MG/ML IV SOLN
0.5000 mg | Freq: Once | INTRAVENOUS | Status: AC | PRN
  Administered 2022-08-08: 0.5 mg via INTRAVENOUS
  Filled 2022-08-08: qty 1

## 2022-08-08 MED ORDER — SODIUM CHLORIDE 0.9 % IV SOLN
10.0000 mg | Freq: Once | INTRAVENOUS | Status: AC
  Administered 2022-08-08: 10 mg via INTRAVENOUS
  Filled 2022-08-08: qty 10

## 2022-08-08 MED ORDER — SODIUM CHLORIDE 0.9 % IV SOLN
2400.0000 mg/m2 | INTRAVENOUS | Status: DC
  Administered 2022-08-08: 5000 mg via INTRAVENOUS
  Filled 2022-08-08: qty 100

## 2022-08-08 MED ORDER — PROCHLORPERAZINE MALEATE 10 MG PO TABS
10.0000 mg | ORAL_TABLET | Freq: Four times a day (QID) | ORAL | 1 refills | Status: DC | PRN
Start: 2022-08-08 — End: 2024-03-12

## 2022-08-08 MED ORDER — SODIUM CHLORIDE 0.9% FLUSH
10.0000 mL | INTRAVENOUS | Status: DC | PRN
  Administered 2022-08-08: 10 mL

## 2022-08-08 NOTE — Progress Notes (Signed)
[facility]   Telephone:(336) [phone] Fax:(336) [phone]   Clinic Follow up Note   Patient Care Team: [name], MD as PCP - General (Family Medicine) [name], MD as Consulting Physician (General Surgery) [name], MD as Consulting Physician (Hematology) [name], MD as Consulting Physician (General Surgery) [name], MD as Consulting Physician (Hematology and Oncology)  Date of Service:  08/08/2022  CHIEF COMPLAINT: f/u of metastatic colon cancer  CURRENT THERAPY:  FOLFIRI every 14 days  ASSESSMENT:  Kim Castro is a 77 y.o. female with   Malignant neoplasm of ascending colon (HCC) pT3N2aM0 stage IIB, MSS, metastatic to b/l ovaries and peritoneum and liver, KRAS mutation G12V (+)  -Initially diagnosed in 08/2019, s/p resection with Dr Maisie Fus on 11/01/19. Path showed overall Stage IIIB cancer.  -s/p 6 months adjuvant FOLFOX, completed 06/01/20, oxaliplatin held for final 2 cycles due to neuropathy.  -s/p BSO and hysterectomy on 12/22/21 with Dr. Pricilla Holm. Path revealed metastatic moderately differentiated colonic adenocarcinoma involving both ovaries and peritoneum -she restarted FOLFOX on 01/24/22. -Due to disease progression, chemotherapy changed to second line FOLFIRI and bevacizumab on April 06, 2022. She has been tolerating moderately well, better now with dose reduction  -Restaging CT scan from June 23, 2022 showed partial response in liver and peritoneal metastasis, no other new lesions.  I reviewed the images and discussed the results with patient.  She is pleased. -She has been tolerating chemotherapy much better lately due to the dose reduction.  We discussed maintenance therapy option in future   Malignant neoplasm of female breast [facility]) -Dx in 05/2014, s/p left lumpectomy with Dr Johna Sheriff, adjuvant RT with Dr [name], and Anastrozole 08/2014 - 01/13/20 under Dr Pamelia Hoit.  -DEXA 09/11/20 T score +1.2 normal.  -most recent  mammogram on 10/04/21 was negative. Will continue yearly    Peripheral neuropathy due to chemotherapy (HCC) -Secondary to oxaliplatin -She is on Lyrica and B complex, continue to monitor.  Overall stable.     Goal of care discussion  -We again discussed the incurable nature of her cancer, and the overall poor prognosis, especially if she does not have good response to chemotherapy or progress on chemo -The patient understands the goal of care is palliative. -I recommend DNR/DNI, she agrees. I placed the order today and gave her a copy of the order    PLAN - repeat scan in May, will order in next visit  - discuss DNR gave a copy of yellow form family has paperwork - continue with treatment today and every 2 weeks  - f/u in 2 weeks     SUMMARY OF ONCOLOGIC HISTORY: Oncology History Overview Note   Cancer Staging  Malignant neoplasm of female breast [facility]) Staging form: Breast, AJCC 7th Edition - Clinical stage from 06/11/2014: Stage 0 (Tis (DCIS), N0, M0) - Unsigned Staged by: Pathologist and managing physician Laterality: Left Estrogen receptor status: Positive Progesterone receptor status: Positive Stage used in treatment planning: Yes National guidelines used in treatment planning: Yes Type of national guideline used in treatment planning: NCCN - Pathologic stage from 07/03/2014: Stage Unknown (Tis (DCIS), NX, cM0) - Signed by [name], MD on 07/10/2014 Staged by: Pathologist Laterality: Left Estrogen receptor status: Positive Progesterone receptor status: Positive Stage used in treatment planning: Yes National guidelines used in treatment planning: Yes Type of national guideline used in treatment planning: NCCN Staging comments: Staged on final lumpectomy specimen by Dr. [name].  Right colon cancer Staging form: Colon and Rectum, AJCC  8th Edition - Pathologic stage from 11/01/2019: Stage IIIB (pT3, pN2a, cM0) - Signed by Malachy MoodFeng, Juda Toepfer, MD on 11/29/2019 Stage prefix: Initial  diagnosis Histologic grading system: 4 grade system Histologic grade (G): G2 Residual tumor (R): R0 - None Tumor deposits (TD): Absent Perineural invasion (PNI): Absent Microsatellite instability (MSI): Stable KRAS mutation: Unknown NRAS mutation: Unknown BRAF mutation: Unknown     Malignant neoplasm of female breast  05/29/2014 Initial Biopsy   Left breast needle core biopsy: Grade 2, DCIS with calcs. ER+ (100%), PR+ (96%).    06/04/2014 Initial Diagnosis   Left breast DCIS with calcifications, ER 100%, PR 96%   06/10/2014 Breast MRI   Left breast: 2.4 x 1.3 x 1.1 cm area of patchy non-mass enhancement upper outer quadrant includes postbiopsy seroma; Right breast: 1.2 cm previously biopsied stable benign fibroadenoma   06/12/2014 Procedure   Genetic counseling/testing: Identified 1 VUS on CHEK2 gene. Remainder of 17 gene panel tested negative and included: ATM, BARD1, BRCA 1/2, BRIP1, CDH1, CHEK2, EPCAM, MLH1, MSH2, MSH6, NBN, NF1, PALB2, PTEN, RAD50, RAD51C, RAD51D, STK11, and TP53.    07/01/2014 Surgery   Left breast lumpectomy ([facility]): Grade 1, DCIS, spanning 2.3 cm, 1 mm margin, ER 100%, PR 96%   07/31/2014 - 08/28/2014 Radiation Therapy   Adjuvant RT completed [name]([facility]). Left breast: Total dose 42.5 Gy over 17 fractions. Left breast boost: Total dose 7.5 Gy over 3 fractions.    09/14/2014 - 01/13/2020 Anti-estrogen oral therapy   Anastrazole 1mg  daily. Planned duration of treatment: 5 years [place]). Completed in 01/2020.    09/25/2014 Survivorship   Survivorship Care Plan given to patient and reviewed with her in person.    03/02/2021 Imaging   CT CAP  IMPRESSION: 1. No findings of active/recurrent malignancy. Partial right hemicolectomy. 2. Endometrial stripe remains mildly thickened, but endometrial biopsy in May was negative for malignancy. 3. Progressive endplate sclerosis and endplate irregularity at T2-3, probably due to degenerative endplate findings. If the has  referable upper thoracic pain/symptoms then thoracic spine MRI could be used for further workup. 4. Other imaging findings of potential clinical significance: Mild cardiomegaly. Aortic Atherosclerosis (ICD10-I70.0). Mild mitral valve calcification. Postoperative findings in the left breast with adjacent radiation port anteriorly in the left upper lobe. Tiny pulmonary nodules in the left lower lobe are unchanged from earliest available comparison of 10/17/2019 and probably benign although may merit surveillance. Multilevel lumbar impingement. Mild pelvic floor laxity.   Malignant neoplasm of ascending colon  09/19/2019 Imaging   CT AP W contrast 09/19/19  IMPRESSION Fullness in the cecum, cannot exclude a mass. No evidence for metastatic disease is identified.    09/24/2019 Procedure   Colonoscopy by Dr Gwendalyn EgeMenon 09/24/19 IMPRESSION 1. The colon was redundant  2. Mild diverticulosis was noted through the entire examined colon 3. Single 12mm polyp was found in the ascending colon; polypectomy was performed using snare cautery and biopsy forceps 4. Mild diverticulosis was notes in the descending colon and sigmoid colon.  5. Single polyp was found in the sigmoid colon, polypectomy was performed with cold forceps.  6. Single polyp was found in the rectosigmoid colon; polypectomy was performed with cold snare  7. Small internal hemorrhoids  8. Large mass was found at the cecum; multiple biopsies of the area were performed using cold forceps; injection (tattooing) was performed distal to the mass.    09/24/2019 Initial Biopsy   INTERPRETATION AND DIAGNOSIS:  A. Cecum, biopsy:  Invasive moderately differentiated adenocarcinoma.  see comment  B.  Polyp @ ascending colon, polypectomy:  Tubular Adenoma  C. Polyp @ sigmoid colon Polypectomy:  hyperplastic polyp.  D. Polyp @ rectosigmoid colon, Polypectomy:  Hyperplastic Polyp      10/16/2019 Imaging   CT Chest IMPRESSION: 1. Multiple small  pulmonary nodules measuring 5 mm or less in size in the lungs. These are nonspecific and are typically considered statistically likely benign. However, given the patient's history of primary malignancy, close attention on follow-up studies is recommended to ensure stability. 2. Aortic atherosclerosis, in addition to right coronary artery disease. Assessment for potential risk factor modification, dietary therapy or pharmacologic therapy may be warranted, if clinically indicated. 3. There are calcifications of the aortic valve and mitral annulus. Echocardiographic correlation for evaluation of potential valvular dysfunction may be warranted if clinically indicated. 4. Small hiatal hernia.   Aortic Atherosclerosis (ICD10-I70.0).   11/01/2019 Initial Diagnosis   Colon cancer (HCC)   11/01/2019 Surgery   LAPAROSCOPIC PARTIAL COLECTOMY by Dr Maisie Fus and Dr Michaell Cowing   11/01/2019 Pathology Results   FINAL MICROSCOPIC DIAGNOSIS:   A. COLON, PROXIMAL RIGHT, COLECTOMY:  - Invasive colonic adenocarcinoma, 5 cm.  - Tumor invades through the muscularis propria into pericolonic tissues.   - Margins of resection are not involved.  - Metastatic carcinoma in (5) of (13) lymph nodes.  - See oncology table.    MSI Stable  Mismatch repair normal  MLH1 - Preserved nuclear expression (greater 50% tumor expression) MSH2 - Preserved nuclear expression (greater 50% tumor expression) MSH6 - Preserved nuclear expression (greater 50% tumor expression) PMS2 - Preserved nuclear expression (greater 50% tumor expression)   11/01/2019 Cancer Staging   Staging form: Colon and Rectum, AJCC 8th Edition - Pathologic stage from 11/01/2019: Stage IIIB (pT3, pN2a, cM0) - Signed by [name], MD on 11/29/2019   12/10/2019 Procedure   PAC placed 12/10/19   12/17/2019 - 06/01/2020 Chemotherapy   FOLFOX q2weeks starting in 2 weeks starting 12/17/19. Held 01/27/20-02/10/20 due to b/l PE. Oxaliplatin held C11-12 due to neuropathy.  Completed on 06/01/20   03/02/2021 Imaging   CT CAP  IMPRESSION: 1. No findings of active/recurrent malignancy. Partial right hemicolectomy. 2. Endometrial stripe remains mildly thickened, but endometrial biopsy in May was negative for malignancy. 3. Progressive endplate sclerosis and endplate irregularity at T2-3, probably due to degenerative endplate findings. If the has referable upper thoracic pain/symptoms then thoracic spine MRI could be used for further workup. 4. Other imaging findings of potential clinical significance: Mild cardiomegaly. Aortic Atherosclerosis (ICD10-I70.0). Mild mitral valve calcification. Postoperative findings in the left breast with adjacent radiation port anteriorly in the left upper lobe. Tiny pulmonary nodules in the left lower lobe are unchanged from earliest available comparison of 10/17/2019 and probably benign although may merit surveillance. Multilevel lumbar impingement. Mild pelvic floor laxity.   08/26/2021 Imaging   EXAM: CT CHEST, ABDOMEN, AND PELVIS WITH CONTRAST  IMPRESSION: 1. Stable examination without new or progressive findings to suggest local recurrence or metastatic disease within the chest, abdomen, or pelvis. 2. Hepatomegaly with hepatic steatosis. 3. Sigmoid colonic diverticulosis without findings of acute diverticulitis. 4. Similar prominent endplate sclerosis and irregularity at T2-T3 is most consistent with Modic type endplate degenerative changes. However, if patient has referable upper thoracic pain consider further workup with thoracic spine MRI. 5. Similar thickening of the endometrial stripe measuring up to 8 mm, which was previously biopsied with results negative for malignancy. 6. Aortic Atherosclerosis (ICD10-I70.0).   11/10/2021 PET scan   IMPRESSION: 1. LEFT ovary is increased  in size and is intensely hypermetabolic. While physiologic hypermetabolic ovarian tissue is not uncommon, the enlargement and asymmetric  activity warrants further evaluation. Consider contrast pelvic MRI vs tissue sampling. 2. No evidence of metastatic colorectal carcinoma otherwise. 3. Post RIGHT hemicolectomy anatomy. 4. Evidence of radiation change in the LEFT upper lobe (remote breast cancer).     12/22/2021 Relapse/Recurrence    FINAL MICROSCOPIC DIAGNOSIS:   A. LEFT OVARY AND FALLOPIAN TUBE, SALPINGO OOPHORECTOMY:  - Metastatic moderately differentiated colonic adenocarcinoma involving left ovary  - Focal ovarian stromal calcification  - Segment of benign fallopian tube   B. UTERUS WITH RIGHT FALLOPIAN TUBE AND OVARY, HYSTERECTOMY AND SALPINGO-OOPHORECTOMY:  - Metastatic moderately differentiated colonic adenocarcinoma involving right ovary  - Focal invasive extensive adenomyosis  - Benign endometrial polyps  - Benign proliferative phase endometrium  - Hydrosalpinx of right fallopian tube  - Focal ovarian stromal calcification   C. PERITONEAL DEPOSITS, ANTERIOR CUL DE SAC, BIOPSY:  - Metastatic mucinous adenocarcinoma, consistent with colorectal primary    COMMENT:  Immunohistochemical stains show that the tumor cells are positive for CK20 and CDX2 while they are negative for CK7 and PAX8, consistent with above interpretation.    01/24/2022 - 03/23/2022 Chemotherapy   Patient is on Treatment Plan : COLORECTAL FOLFOX q14d x 3 months     04/01/2022 Imaging   CT AP IMPRESSION: 1. New omental metastatic disease. 2. Vague hypoattenuating lesion in the inferior right hepatic lobe is new and also worrisome for metastatic disease. 3. Similar small right lower lobe nodules. Recommend continued attention on follow-up. 4. Steatotic enlarged liver. 5.  Aortic atherosclerosis (ICD10-I70.0).   04/06/2022 -  Chemotherapy   Patient is on Treatment Plan : COLORECTAL FOLFIRI + Bevacizumab q14d     04/21/2022 Imaging    IMPRESSION: 1. Stable chest CT. No evidence of metastatic disease. 2. Stable small pulmonary nodules,  considered benign based on stability. 3. Stable postsurgical changes in the left breast and anterior left upper lobe radiation changes. 4. Aortic Atherosclerosis (ICD10-I70.0) and Emphysema (ICD10-J43.9).   04/21/2022 Imaging    IMPRESSION: 1. Small enhancing lesion inferiorly in the right hepatic lobe is typical of metastatic disease and stable from recent abdominal CT. No other definite liver lesions are identified. Mild hepatic steatosis. 2. No other significant abdominal findings. 3. Omental nodularity seen on CT is not well visualized by MRI.    Imaging     06/23/2022 Imaging    IMPRESSION: 1. Hypodense lesion of the inferior right lobe of the liver, hepatic segment VI is diminished in size, consistent with treatment response. 2. Tiny peritoneal and omental nodules identified by prior examination are diminished in size, consistent with treatment response. 3. Multiple tiny pulmonary nodules unchanged, most likely benign and incidental, however continued attention on follow-up warranted in the setting of known metastatic disease. 4. No evidence of new metastatic disease in the chest, abdomen, or pelvis. 5. Status post partial right hemicolectomy and reanastomosis. 6. Diffuse mosaic attenuation of the airspaces, consistent with small airways disease. 7. Hepatomegaly.      INTERVAL HISTORY:  Kim Castro is here for a follow up of Colon Cancer She was last seen by me on 07/11/2022 She presents to the clinic alone. Patient states she feels well over all, staying very active with grand kids and mowing her yard. Appetite is good and eating well no weight loss, some swelling in ankles but keeps her feet elevated to reduce. All other systems were reviewed with the patient and  are negative.  MEDICAL HISTORY:  Past Medical History:  Diagnosis Date   Aortic atherosclerosis    Arthritis    feet, lower back   Basal cell carcinoma    arm   Breast cancer of upper-outer  quadrant of left female breast 06/04/2014   Cataract    immature on the left   Colon cancer 08/2019   Diabetes mellitus without complication    Diverticulosis    Dizziness    > 66yrs ago;took Antivert    Family history of anesthesia complication    sister slow to wake up with anesthesia   Family history of breast cancer    Family history of colon cancer    Family history of uterine cancer    GERD (gastroesophageal reflux disease)    takes occasional TUMs   History of bronchitis    > 9yrs ago   History of colon polyps    History of hiatal hernia    Small noted on CT   History of pulmonary embolus (PE)    Hypertension    takes Losartan daily and HCTZ   Iron deficiency anemia    Joint pain    Numbness    to toes on each foot   Peripheral neuropathy    feet and toes   Personal history of radiation therapy    Pulmonary nodules    Noted on CT   Radiation 07/31/14-08/28/14   Left Breast 20 fxs   Seasonal allergies    takes Claritin prn   Urinary frequency    Vitamin D deficiency    takes VIt D daily    SURGICAL HISTORY: Past Surgical History:  Procedure Laterality Date   BREAST BIOPSY Bilateral    BREAST LUMPECTOMY Left    BREAST LUMPECTOMY WITH RADIOACTIVE SEED LOCALIZATION Left 07/01/2014   Procedure: LEFT BREAST LUMPECTOMY WITH RADIOACTIVE SEED LOCALIZATION;  Surgeon: Glenna Fellows, MD;  Location: [facility];  Service: General;  Laterality: Left;   CATARACT EXTRACTION Right    COLONOSCOPY  09/24/2019   [place]   LAPAROSCOPIC PARTIAL COLECTOMY N/A 11/01/2019   Procedure: LAPAROSCOPIC PARTIAL COLECTOMY;  Surgeon: [name], MD;  Location: [facility];  Service: General;  Laterality: N/A;   POLYPECTOMY     PORTACATH PLACEMENT N/A 12/10/2019   Procedure: INSERTION PORT-A-CATH ULTRASOUND GUIDED IN RIGHT IJ;  Surgeon: [name], MD;  Location: [facility];  Service: General;  Laterality: N/A;   ROBOTIC ASSISTED TOTAL HYSTERECTOMY Bilateral 12/22/2021    Procedure: XI ROBOTIC ASSISTED TOTAL HYSTERECTOMY WITH BILATERAL SALPINGO OOPHORECTOMY;  Surgeon: Carver Fila, MD;  Location: [facility];  Service: Gynecology;  Laterality: Bilateral;   TOTAL KNEE ARTHROPLASTY Left 10/24/2012   Procedure: TOTAL KNEE ARTHROPLASTY;  Surgeon: Nestor Lewandowsky, MD;  Location: [facility];  Service: Orthopedics;  Laterality: Left;   TOTAL KNEE ARTHROPLASTY Right 01/09/2013   Procedure: TOTAL KNEE ARTHROPLASTY;  Surgeon: Nestor Lewandowsky, MD;  Location: [facility];  Service: Orthopedics;  Laterality: Right;   TUBAL LIGATION      I have reviewed the social history and family history with the patient and they are unchanged from previous note.  ALLERGIES:  is allergic to oxycodone.  MEDICATIONS:  Current Outpatient Medications  Medication Sig Dispense Refill   acetaminophen (TYLENOL) 325 MG tablet Take 650 mg by mouth every 6 (six) hours as needed for moderate pain.     b complex vitamins capsule Take 1 capsule by mouth daily.     Cholecalciferol (VITAMIN D) 2000 UNITS CAPS  Take 2,000 Units by mouth daily.      Ibuprofen 200 MG CAPS Take by mouth.     loratadine (CLARITIN) 10 MG tablet Take 10 mg by mouth daily as needed for allergies.     losartan (COZAAR) 25 MG tablet Take 1 tablet (25 mg total) by mouth daily. 90 tablet 3   metFORMIN (GLUCOPHAGE) 500 MG tablet Take 1 tablet (500 mg total) by mouth in the morning and at bedtime. 180 tablet 3   Multiple Vitamins-Minerals (MULTIVITAMIN WITH MINERALS) tablet Take 1 tablet by mouth daily.     omeprazole (PRILOSEC) 20 MG capsule Take 20 mg by mouth daily before breakfast.      ondansetron (ZOFRAN) 8 MG tablet Take 1 tablet (8 mg total) by mouth every 8 (eight) hours as needed for nausea or vomiting. Start on the third day after chemotherapy. 30 tablet 1   potassium chloride (KLOR-CON) 10 MEQ tablet Take 1 tablet by mouth once daily 30 tablet 0   pravastatin (PRAVACHOL) 10 MG tablet Take 1 tablet (10 mg total) by mouth daily. 90  tablet 3   pregabalin (LYRICA) 25 MG capsule Take 1 capsule (25 mg total) by mouth 2 (two) times daily. 60 capsule 3   prochlorperazine (COMPAZINE) 10 MG tablet Take 1 tablet (10 mg total) by mouth every 6 (six) hours as needed for nausea or vomiting. 30 tablet 1   No current facility-administered medications for this visit.    PHYSICAL EXAMINATION: ECOG PERFORMANCE STATUS: 1 - Symptomatic but completely ambulatory  Vitals:   08/08/22 0905  BP: (!) 144/62  Pulse: 67  Resp: 18  Temp: 98 F (36.7 C)  SpO2: 100%   Wt Readings from Last 3 Encounters:  08/08/22 214 lb 6.4 oz (97.3 kg)  07/25/22 213 lb 6.4 oz (96.8 kg)  07/11/22 217 lb (98.4 kg)     GENERAL:alert, no distress and comfortable SKIN: skin color, texture, turgor are normal, no rashes or significant lesions EYES: normal, Conjunctiva are pink and non-injected, sclera clear NECK: supple, thyroid normal size, non-tender, without nodularity LYMPH:  no palpable lymphadenopathy in the cervical, axillary  LUNGS: clear to auscultation and percussion with normal breathing effort HEART: regular rate & rhythm and no murmurs and no lower extremity edema ABDOMEN:abdomen soft, non-tender and normal bowel sounds Musculoskeletal:no cyanosis of digits and no clubbing  NEURO: alert & oriented x 3 with fluent speech, no focal motor/sensory deficits  LABORATORY DATA:  I have reviewed the data as listed    Latest Ref Rng & Units 08/08/2022    8:30 AM 07/25/2022    8:00 AM 07/11/2022    8:51 AM  CBC  WBC 4.0 - 10.5 K/uL 4.1  11.4  3.8   Hemoglobin 12.0 - 15.0 g/dL 16.1  09.6  9.9   Hematocrit 36.0 - 46.0 % 33.3  36.2  31.7   Platelets 150 - 400 K/uL 233  201  239         Latest Ref Rng & Units 08/08/2022    8:30 AM 07/25/2022    8:00 AM 07/11/2022    8:51 AM  CMP  Glucose 70 - 99 mg/dL 045  409  811   BUN 8 - 23 mg/dL 21  12  15    Creatinine 0.44 - 1.00 mg/dL 9.14  7.82  9.56   Sodium 135 - 145 mmol/L 142  144  143   Potassium  3.5 - 5.1 mmol/L 4.1  4.1  4.0   Chloride 98 -  111 mmol/L 109  109  110   CO2 22 - 32 mmol/L 24  27  26    Calcium 8.9 - 10.3 mg/dL 9.5  9.4  9.1   Total Protein 6.5 - 8.1 g/dL 6.4  6.3  6.2   Total Bilirubin 0.3 - 1.2 mg/dL 0.8  0.7  0.8   Alkaline Phos 38 - 126 U/L 100  137  90   AST 15 - 41 U/L 24  25  21    ALT 0 - 44 U/L 18  18  14        RADIOGRAPHIC STUDIES: I have personally reviewed the radiological images as listed and agreed with the findings in the report. No results found.    Orders Placed This Encounter  Procedures   CBC with Differential (Cancer Center Only)    Standing Status:   Future    Standing Expiration Date:   09/19/2023   CMP (Cancer Center only)    Standing Status:   Future    Standing Expiration Date:   09/19/2023   Total Protein, Urine dipstick    Standing Status:   Future    Standing Expiration Date:   09/19/2023   CBC with Differential (Cancer Center Only)    Standing Status:   Future    Standing Expiration Date:   10/03/2023   CMP (Cancer Center only)    Standing Status:   Future    Standing Expiration Date:   10/03/2023   Total Protein, Urine dipstick    Standing Status:   Future    Standing Expiration Date:   10/03/2023   Do not attempt resuscitation (DNR)    Order Specific Question:   If patient has no pulse and is not breathing    Answer:   Do Not Attempt Resuscitation    Order Specific Question:   If patient has a pulse and/or is breathing: Medical Treatment Goals    Answer:   LIMITED ADDITIONAL INTERVENTIONS: Use medication/IV fluids and cardiac monitoring as indicated; Do not use intubation or mechanical ventilation (DNI), also provide comfort medications.  Transfer to Progressive/Stepdown as indicated, avoid Intensive Care.    Order Specific Question:   Consent:    Answer:   Discussion documented in EHR or advanced directives reviewed   All questions were answered. The patient knows to call the clinic with any problems, questions or concerns. No  barriers to learning was detected. The total time spent in the appointment was 30 minutes.     [name], MD 08/08/2022   I, Sharlette Dense, CMA, am acting as scribe for [name], MD.   I have reviewed the above documentation for accuracy and completeness, and I agree with the above.

## 2022-08-09 ENCOUNTER — Other Ambulatory Visit: Payer: Self-pay

## 2022-08-10 ENCOUNTER — Inpatient Hospital Stay (HOSPITAL_BASED_OUTPATIENT_CLINIC_OR_DEPARTMENT_OTHER): Payer: Medicare Other

## 2022-08-10 ENCOUNTER — Other Ambulatory Visit: Payer: Self-pay

## 2022-08-10 VITALS — BP 127/46 | HR 65 | Temp 98.9°F | Resp 20

## 2022-08-10 DIAGNOSIS — C182 Malignant neoplasm of ascending colon: Secondary | ICD-10-CM | POA: Diagnosis not present

## 2022-08-10 DIAGNOSIS — Z90710 Acquired absence of both cervix and uterus: Secondary | ICD-10-CM | POA: Diagnosis not present

## 2022-08-10 DIAGNOSIS — C787 Secondary malignant neoplasm of liver and intrahepatic bile duct: Secondary | ICD-10-CM | POA: Diagnosis not present

## 2022-08-10 DIAGNOSIS — Z5111 Encounter for antineoplastic chemotherapy: Secondary | ICD-10-CM | POA: Diagnosis not present

## 2022-08-10 DIAGNOSIS — C7963 Secondary malignant neoplasm of bilateral ovaries: Secondary | ICD-10-CM | POA: Diagnosis not present

## 2022-08-10 DIAGNOSIS — G620 Drug-induced polyneuropathy: Secondary | ICD-10-CM | POA: Diagnosis not present

## 2022-08-10 DIAGNOSIS — C786 Secondary malignant neoplasm of retroperitoneum and peritoneum: Secondary | ICD-10-CM | POA: Diagnosis not present

## 2022-08-10 DIAGNOSIS — Z5189 Encounter for other specified aftercare: Secondary | ICD-10-CM | POA: Diagnosis not present

## 2022-08-10 DIAGNOSIS — Z90722 Acquired absence of ovaries, bilateral: Secondary | ICD-10-CM | POA: Diagnosis not present

## 2022-08-10 DIAGNOSIS — Z853 Personal history of malignant neoplasm of breast: Secondary | ICD-10-CM | POA: Diagnosis not present

## 2022-08-10 DIAGNOSIS — Z66 Do not resuscitate: Secondary | ICD-10-CM | POA: Diagnosis not present

## 2022-08-10 MED ORDER — SODIUM CHLORIDE 0.9% FLUSH
10.0000 mL | INTRAVENOUS | Status: DC | PRN
  Administered 2022-08-10: 10 mL

## 2022-08-10 MED ORDER — HEPARIN SOD (PORK) LOCK FLUSH 100 UNIT/ML IV SOLN
500.0000 [IU] | Freq: Once | INTRAVENOUS | Status: AC | PRN
  Administered 2022-08-10: 500 [IU]

## 2022-08-10 MED ORDER — PEGFILGRASTIM-CBQV 6 MG/0.6ML ~~LOC~~ SOSY
6.0000 mg | PREFILLED_SYRINGE | Freq: Once | SUBCUTANEOUS | Status: AC
  Administered 2022-08-10: 6 mg via SUBCUTANEOUS
  Filled 2022-08-10: qty 0.6

## 2022-08-16 ENCOUNTER — Ambulatory Visit (INDEPENDENT_AMBULATORY_CARE_PROVIDER_SITE_OTHER): Payer: Medicare Other | Admitting: Family Medicine

## 2022-08-16 ENCOUNTER — Encounter: Payer: Self-pay | Admitting: Family Medicine

## 2022-08-16 VITALS — BP 124/70 | HR 78 | Temp 97.8°F | Ht 65.0 in | Wt 210.2 lb

## 2022-08-16 DIAGNOSIS — I10 Essential (primary) hypertension: Secondary | ICD-10-CM | POA: Diagnosis not present

## 2022-08-16 DIAGNOSIS — C182 Malignant neoplasm of ascending colon: Secondary | ICD-10-CM

## 2022-08-16 DIAGNOSIS — E785 Hyperlipidemia, unspecified: Secondary | ICD-10-CM

## 2022-08-16 DIAGNOSIS — E1169 Type 2 diabetes mellitus with other specified complication: Secondary | ICD-10-CM

## 2022-08-16 LAB — GLUCOSE, RANDOM: Glucose, Bld: 112 mg/dL — ABNORMAL HIGH (ref 70–99)

## 2022-08-16 LAB — HEMOGLOBIN A1C: Hgb A1c MFr Bld: 6.2 % (ref 4.6–6.5)

## 2022-08-16 NOTE — Assessment & Plan Note (Signed)
Blood pressure is in good control. Continue losartan 25 mg daily.

## 2022-08-16 NOTE — Progress Notes (Signed)
[facility] [address] [place] [address] Dept: [phone] Dept Fax: [phone]  Chronic Care Office Visit  Subjective:    Patient ID: Kim Castro, female    DOB: December 23, 1945, 77 y.o..   MRN: [id number]  Chief Complaint  Patient presents with   Medical Management of Chronic Issues    3 month f/u.     History of Present Illness:  Patient is in today for reassessment of chronic medical issues.  [name] has a history of type 2 diabetes. She is managed on metformin 500 mg bid.    [name] has a history of hypertension. She is managed on losartan 25 mg daily.   [name] has a history of hyperlipidemia. She is currently on pravastatin 10 mg daily.   [name] has a history of both breast and colon cancer. Earlier in 2023, [name] was found to have metastatic colon cancer throughout the peritoneal cavity, esp. involving both ovaries. She underwent a total hysterectomy. She currently has a Port-a-cath in place and is receiving chemotherapy. She is receiving this every other week. She has struggled to keep her weight up. She notes decreased appetite for a few days after her chemotherapy and some diarrhea. She is trying to push herself to eat and is disappointed her weight is still going down. Has met with the nutritionist. Current plan is to continue chemotherapy into June.  Past Medical History: Patient Active Problem List   Diagnosis Date Noted   Metastasis to peritoneal cavity 01/12/2022   Nausea without vomiting 12/23/2021   Thickened endometrium    Essential hypertension 05/12/2021   Vitamin D deficiency 05/12/2021   Personal history of malignant neoplasm of breast 05/12/2021   Personal history of colonic polyps 05/12/2021   Morbid obesity 05/12/2021   Hyperlipidemia 05/12/2021   Hypercalcemia 05/12/2021   Allergic rhinitis 05/12/2021   Knee joint replacement status, bilateral 05/12/2021   Peripheral  neuropathy due to chemotherapy 05/12/2021   Aortic atherosclerosis 05/12/2021   Gastroesophageal reflux disease    Type 2 diabetes mellitus with hyperlipidemia    History of pulmonary embolism 01/27/2020   Port-A-Cath in place 12/30/2019   Malignant neoplasm of ascending colon 11/01/2019   Genetic testing 07/08/2014   Malignant neoplasm of female breast 06/04/2014   Past Surgical History:  Procedure Laterality Date   BREAST BIOPSY Bilateral    BREAST LUMPECTOMY Left    BREAST LUMPECTOMY WITH RADIOACTIVE SEED LOCALIZATION Left 07/01/2014   Procedure: LEFT BREAST LUMPECTOMY WITH RADIOACTIVE SEED LOCALIZATION;  Surgeon: Glenna Fellows, MD;  Location: [facility];  Service: General;  Laterality: Left;   CATARACT EXTRACTION Right    COLONOSCOPY  09/24/2019   [place]   LAPAROSCOPIC PARTIAL COLECTOMY N/A 11/01/2019   Procedure: LAPAROSCOPIC PARTIAL COLECTOMY;  Surgeon: [name], MD;  Location: [facility];  Service: General;  Laterality: N/A;   POLYPECTOMY     PORTACATH PLACEMENT N/A 12/10/2019   Procedure: INSERTION PORT-A-CATH ULTRASOUND GUIDED IN RIGHT IJ;  Surgeon: [name], MD;  Location: [facility];  Service: General;  Laterality: N/A;   ROBOTIC ASSISTED TOTAL HYSTERECTOMY Bilateral 12/22/2021   Procedure: XI ROBOTIC ASSISTED TOTAL HYSTERECTOMY WITH BILATERAL SALPINGO OOPHORECTOMY;  Surgeon: Carver Fila, MD;  Location: [facility];  Service: Gynecology;  Laterality: Bilateral;   TOTAL KNEE ARTHROPLASTY Left 10/24/2012   Procedure: TOTAL KNEE ARTHROPLASTY;  Surgeon: Nestor Lewandowsky, MD;  Location: [facility];  Service: Orthopedics;  Laterality: Left;   TOTAL KNEE  ARTHROPLASTY Right 01/09/2013   Procedure: TOTAL KNEE ARTHROPLASTY;  Surgeon: Nestor Lewandowsky, MD;  Location: [facility];  Service: Orthopedics;  Laterality: Right;   TUBAL LIGATION     Family History  Problem Relation Age of Onset   Diabetes Mother    Uterine cancer Mother        deceased 84   Hypertension Father     Heart disease Father    Diabetes Sister    Breast cancer Sister 28       currently 73   Hypertension Sister    Diabetes Sister    Breast cancer Sister    Diabetes Sister    Kidney disease Sister    Heart disease Sister    Lupus Sister    Heart disease Sister    Diabetes Sister    Diabetes Sister    Hypertension Brother    Diabetes Brother    Colon cancer Brother 44   Colon polyps Daughter    Thyroid cancer Daughter 42       currently 63; type?   Cancer Maternal Uncle        unk. primary; deceased 14s   Stroke Maternal Grandmother    Esophageal cancer Neg Hx    Rectal cancer Neg Hx    Stomach cancer Neg Hx    Outpatient Medications Prior to Visit  Medication Sig Dispense Refill   acetaminophen (TYLENOL) 325 MG tablet Take 650 mg by mouth every 6 (six) hours as needed for moderate pain.     b complex vitamins capsule Take 1 capsule by mouth daily.     Cholecalciferol (VITAMIN D) 2000 UNITS CAPS Take 2,000 Units by mouth daily.      Ibuprofen 200 MG CAPS Take by mouth.     loratadine (CLARITIN) 10 MG tablet Take 10 mg by mouth daily as needed for allergies.     losartan (COZAAR) 25 MG tablet Take 1 tablet (25 mg total) by mouth daily. 90 tablet 3   metFORMIN (GLUCOPHAGE) 500 MG tablet Take 1 tablet (500 mg total) by mouth in the morning and at bedtime. 180 tablet 3   Multiple Vitamins-Minerals (MULTIVITAMIN WITH MINERALS) tablet Take 1 tablet by mouth daily.     omeprazole (PRILOSEC) 20 MG capsule Take 20 mg by mouth daily before breakfast.      ondansetron (ZOFRAN) 8 MG tablet Take 1 tablet (8 mg total) by mouth every 8 (eight) hours as needed for nausea or vomiting. Start on the third day after chemotherapy. 30 tablet 1   potassium chloride (KLOR-CON) 10 MEQ tablet Take 1 tablet by mouth once daily 30 tablet 0   pravastatin (PRAVACHOL) 10 MG tablet Take 1 tablet (10 mg total) by mouth daily. 90 tablet 3   pregabalin (LYRICA) 25 MG capsule Take 1 capsule (25 mg total) by  mouth 2 (two) times daily. 60 capsule 3   prochlorperazine (COMPAZINE) 10 MG tablet Take 1 tablet (10 mg total) by mouth every 6 (six) hours as needed for nausea or vomiting. 30 tablet 1   No facility-administered medications prior to visit.   Allergies  Allergen Reactions   Oxycodone Nausea And Vomiting   Objective:   Today's Vitals   08/16/22 0756  BP: 124/70  Pulse: 78  Temp: 97.8 F (36.6 C)  TempSrc: Temporal  SpO2: 97%  Weight: 210 lb 3.2 oz (95.3 kg)  Height: 5\' 5"  (1.651 m)   Body mass index is 34.98 kg/m.   General: Well developed, well nourished. No  acute distress. Psych: Alert and oriented. Normal mood and affect.  Health Maintenance Due  Topic Date Due   Medicare Annual Wellness (AWV)  09/02/2022   Lab Results    Latest Ref Rng & Units 08/08/2022    8:30 AM 07/25/2022    8:00 AM 07/11/2022    8:51 AM  CBC  WBC 4.0 - 10.5 K/uL 4.1  11.4  3.8   Hemoglobin 12.0 - 15.0 g/dL 16.1  09.6  9.9   Hematocrit 36.0 - 46.0 % 33.3  36.2  31.7   Platelets 150 - 400 K/uL 233  201  239       Latest Ref Rng & Units 08/08/2022    8:30 AM 07/25/2022    8:00 AM 07/11/2022    8:51 AM  CMP  Glucose 70 - 99 mg/dL 045  409  811   BUN 8 - 23 mg/dL Creatinine 0.44 - 1.00 mg/dL 9.14  7.82  9.56   Sodium 135 - 145 mmol/L 142  144  143   Potassium 3.5 - 5.1 mmol/L 4.1  4.1  4.0   Chloride 98 - 111 mmol/L 109  109  110   CO2 22 - 32 mmol/L Calcium 8.9 - 10.3 mg/dL 9.5  9.4  9.1   Total Protein 6.5 - 8.1 g/dL 6.4  6.3  6.2   Total Bilirubin 0.3 - 1.2 mg/dL 0.8  0.7  0.8   Alkaline Phos 38 - 126 U/L 100  137  90   AST 15 - 41 U/L ALT 0 - 44 U/L Component Ref Range & Units 8 d ago (08/08/22) 1 mo ago (07/11/22) 2 mo ago (06/13/22) 2 mo ago (05/30/22) 3 mo ago (05/03/22) 5 mo ago (03/07/22) 7 mo ago (01/13/22)  CEA ([facility]) 0.00 - 5.00 ng/mL 6.85 High  6.54 High  CM 7.26 High  CM 7.34 High  CM 10.29 High  CM 39.61 High  CM  22.67 High  C   Imaging: CT Chest, Abdomen, and Pelvis w contrast (06/23/2022) IMPRESSION: 1. Hypodense lesion of the inferior right lobe of the liver, hepatic segment VI is diminished in size, consistent with treatment response. 2. Tiny peritoneal and omental nodules identified by prior examination are diminished in size, consistent with treatment response. 3. Multiple tiny pulmonary nodules unchanged, most likely benign and incidental, however continued attention on follow-up warranted in the setting of known metastatic disease. 4. No evidence of new metastatic disease in the chest, abdomen, or pelvis. 5. Status post partial right hemicolectomy and reanastomosis. 6. Diffuse mosaic attenuation of the airspaces, consistent with small airways disease. 7. Hepatomegaly.   Aortic Atherosclerosis (ICD10-I70.0).    Assessment & Plan:   Problem List Items Addressed This Visit       Cardiovascular and Mediastinum   Essential hypertension     Digestive   Malignant neoplasm of ascending colon     Endocrine   Type 2 diabetes mellitus with hyperlipidemia - Primary   Relevant Orders   Glucose, random   Hemoglobin A1c    Return in about 3 months (around 11/15/2022) for Reassessment.   [name], MD

## 2022-08-16 NOTE — Assessment & Plan Note (Signed)
Will check A1c today. Continue metformin 500 mg bid.

## 2022-08-16 NOTE — Assessment & Plan Note (Signed)
Continue chemotherapy. Encouraged her about maintaining adequate nutrition and hydration. Weight is down ~ 42 lbs since starting chemotherapy. We will continue to monitor this along with the nutritionist.

## 2022-08-17 ENCOUNTER — Other Ambulatory Visit: Payer: Self-pay

## 2022-08-19 MED FILL — Dexamethasone Sodium Phosphate Inj 100 MG/10ML: INTRAMUSCULAR | Qty: 1 | Status: AC

## 2022-08-21 DIAGNOSIS — Z66 Do not resuscitate: Secondary | ICD-10-CM | POA: Insufficient documentation

## 2022-08-21 NOTE — Assessment & Plan Note (Signed)
[id number] stage IIB, MSS, metastatic to b/l ovaries and peritoneum and liver, KRAS mutation G12V (+)  -Initially diagnosed in 08/2019, s/p resection with Dr Thomas on 11/01/19. Path showed overall Stage IIIB cancer.  -s/p 6 months adjuvant FOLFOX, completed 06/01/20, oxaliplatin held for final 2 cycles due to neuropathy.  -s/p BSO and hysterectomy on 12/22/21 with Dr. Tucker. Path revealed metastatic moderately differentiated colonic adenocarcinoma involving both ovaries and peritoneum -she restarted FOLFOX on 01/24/22. -Due to disease progression, chemotherapy changed to second line FOLFIRI and bevacizumab on April 06, 2022. She has been tolerating moderately well, better now with dose reduction  -Restaging CT scan from June 23, 2022 showed partial response in liver and peritoneal metastasis, no other new lesions.  I reviewed the images and discussed the results with patient.  She is pleased. -She has been tolerating chemotherapy much better lately due to the dose reduction.  We discussed maintenance therapy option in future  

## 2022-08-21 NOTE — Assessment & Plan Note (Signed)
-  The patient understands the goal of care is palliative.  -pt has agreed with DNR, order placed.

## 2022-08-21 NOTE — Assessment & Plan Note (Signed)
-  Secondary to oxaliplatin -She is on Lyrica and B complex, continue to monitor.  Overall stable.   

## 2022-08-21 NOTE — Assessment & Plan Note (Signed)
-  Dx in 05/2014, s/p left lumpectomy with Dr Hoxworth, adjuvant RT with Dr Wentworth, and Anastrozole 08/2014 - 01/13/20 under Dr Gudena.  -DEXA 09/11/20 T score +1.2 normal.  -most recent mammogram on 10/04/21 was negative. Will continue yearly  

## 2022-08-22 ENCOUNTER — Inpatient Hospital Stay: Payer: Medicare Other

## 2022-08-22 ENCOUNTER — Inpatient Hospital Stay (HOSPITAL_BASED_OUTPATIENT_CLINIC_OR_DEPARTMENT_OTHER): Payer: Medicare Other | Admitting: Hematology

## 2022-08-22 ENCOUNTER — Encounter: Payer: Self-pay | Admitting: Hematology

## 2022-08-22 VITALS — BP 153/54 | HR 67 | Temp 97.7°F | Resp 18 | Ht 65.0 in | Wt 213.8 lb

## 2022-08-22 DIAGNOSIS — C7963 Secondary malignant neoplasm of bilateral ovaries: Secondary | ICD-10-CM | POA: Diagnosis not present

## 2022-08-22 DIAGNOSIS — Z853 Personal history of malignant neoplasm of breast: Secondary | ICD-10-CM

## 2022-08-22 DIAGNOSIS — G620 Drug-induced polyneuropathy: Secondary | ICD-10-CM | POA: Diagnosis not present

## 2022-08-22 DIAGNOSIS — Z66 Do not resuscitate: Secondary | ICD-10-CM

## 2022-08-22 DIAGNOSIS — T451X5A Adverse effect of antineoplastic and immunosuppressive drugs, initial encounter: Secondary | ICD-10-CM

## 2022-08-22 DIAGNOSIS — C182 Malignant neoplasm of ascending colon: Secondary | ICD-10-CM

## 2022-08-22 DIAGNOSIS — C787 Secondary malignant neoplasm of liver and intrahepatic bile duct: Secondary | ICD-10-CM

## 2022-08-22 DIAGNOSIS — C786 Secondary malignant neoplasm of retroperitoneum and peritoneum: Secondary | ICD-10-CM | POA: Diagnosis not present

## 2022-08-22 DIAGNOSIS — Z5111 Encounter for antineoplastic chemotherapy: Secondary | ICD-10-CM | POA: Diagnosis not present

## 2022-08-22 DIAGNOSIS — Z803 Family history of malignant neoplasm of breast: Secondary | ICD-10-CM

## 2022-08-22 DIAGNOSIS — Z5189 Encounter for other specified aftercare: Secondary | ICD-10-CM | POA: Diagnosis not present

## 2022-08-22 DIAGNOSIS — Z90722 Acquired absence of ovaries, bilateral: Secondary | ICD-10-CM | POA: Diagnosis not present

## 2022-08-22 DIAGNOSIS — Z90710 Acquired absence of both cervix and uterus: Secondary | ICD-10-CM | POA: Diagnosis not present

## 2022-08-22 DIAGNOSIS — Z8049 Family history of malignant neoplasm of other genital organs: Secondary | ICD-10-CM

## 2022-08-22 DIAGNOSIS — C50919 Malignant neoplasm of unspecified site of unspecified female breast: Secondary | ICD-10-CM

## 2022-08-22 DIAGNOSIS — Z800 Family history of malignant neoplasm of digestive organs: Secondary | ICD-10-CM

## 2022-08-22 DIAGNOSIS — Z95828 Presence of other vascular implants and grafts: Secondary | ICD-10-CM

## 2022-08-22 LAB — CBC WITH DIFFERENTIAL (CANCER CENTER ONLY)
Abs Immature Granulocytes: 0.57 10*3/uL — ABNORMAL HIGH (ref 0.00–0.07)
Basophils Absolute: 0.1 10*3/uL (ref 0.0–0.1)
Basophils Relative: 0 %
Eosinophils Absolute: 0.3 10*3/uL (ref 0.0–0.5)
Eosinophils Relative: 3 %
HCT: 35.9 % — ABNORMAL LOW (ref 36.0–46.0)
Hemoglobin: 11.4 g/dL — ABNORMAL LOW (ref 12.0–15.0)
Immature Granulocytes: 5 %
Lymphocytes Relative: 22 %
Lymphs Abs: 2.8 10*3/uL (ref 0.7–4.0)
MCH: 32.9 pg (ref 26.0–34.0)
MCHC: 31.8 g/dL (ref 30.0–36.0)
MCV: 103.5 fL — ABNORMAL HIGH (ref 80.0–100.0)
Monocytes Absolute: 0.8 10*3/uL (ref 0.1–1.0)
Monocytes Relative: 6 %
Neutro Abs: 7.9 10*3/uL — ABNORMAL HIGH (ref 1.7–7.7)
Neutrophils Relative %: 64 %
Platelet Count: 192 10*3/uL (ref 150–400)
RBC: 3.47 MIL/uL — ABNORMAL LOW (ref 3.87–5.11)
RDW: 16.1 % — ABNORMAL HIGH (ref 11.5–15.5)
Smear Review: NORMAL
WBC Count: 12.5 10*3/uL — ABNORMAL HIGH (ref 4.0–10.5)
nRBC: 0 % (ref 0.0–0.2)

## 2022-08-22 LAB — CMP (CANCER CENTER ONLY)
ALT: 16 U/L (ref 0–44)
AST: 22 U/L (ref 15–41)
Albumin: 4 g/dL (ref 3.5–5.0)
Alkaline Phosphatase: 122 U/L (ref 38–126)
Anion gap: 8 (ref 5–15)
BUN: 16 mg/dL (ref 8–23)
CO2: 25 mmol/L (ref 22–32)
Calcium: 9.5 mg/dL (ref 8.9–10.3)
Chloride: 111 mmol/L (ref 98–111)
Creatinine: 0.85 mg/dL (ref 0.44–1.00)
GFR, Estimated: >60 mL/min (ref >60)
Glucose, Bld: 165 mg/dL — ABNORMAL HIGH (ref 70–99)
Potassium: 3.9 mmol/L (ref 3.5–5.1)
Sodium: 144 mmol/L (ref 135–145)
Total Bilirubin: 0.6 mg/dL (ref 0.3–1.2)
Total Protein: 6.7 g/dL (ref 6.5–8.1)

## 2022-08-22 MED ORDER — SODIUM CHLORIDE 0.9 % IV SOLN
100.0000 mg/m2 | Freq: Once | INTRAVENOUS | Status: AC
  Administered 2022-08-22: 220 mg via INTRAVENOUS
  Filled 2022-08-22: qty 11

## 2022-08-22 MED ORDER — ATROPINE SULFATE 1 MG/ML IV SOLN
0.5000 mg | Freq: Once | INTRAVENOUS | Status: AC | PRN
  Administered 2022-08-22: 0.5 mg via INTRAVENOUS
  Filled 2022-08-22: qty 1

## 2022-08-22 MED ORDER — SODIUM CHLORIDE 0.9 % IV SOLN: Freq: Once | INTRAVENOUS | Status: AC

## 2022-08-22 MED ORDER — SODIUM CHLORIDE 0.9 % IV SOLN
400.0000 mg/m2 | Freq: Once | INTRAVENOUS | Status: AC
  Administered 2022-08-22: 896 mg via INTRAVENOUS
  Filled 2022-08-22: qty 44.8

## 2022-08-22 MED ORDER — SODIUM CHLORIDE 0.9 % IV SOLN
10.0000 mg | Freq: Once | INTRAVENOUS | Status: AC
  Administered 2022-08-22: 10 mg via INTRAVENOUS
  Filled 2022-08-22: qty 10

## 2022-08-22 MED ORDER — PALONOSETRON HCL INJECTION 0.25 MG/5ML
0.2500 mg | Freq: Once | INTRAVENOUS | Status: AC
  Administered 2022-08-22: 0.25 mg via INTRAVENOUS
  Filled 2022-08-22: qty 5

## 2022-08-22 MED ORDER — SODIUM CHLORIDE 0.9 % IV SOLN
5.0000 mg/kg | Freq: Once | INTRAVENOUS | Status: AC
  Administered 2022-08-22: 500 mg via INTRAVENOUS
  Filled 2022-08-22: qty 4

## 2022-08-22 MED ORDER — SODIUM CHLORIDE 0.9% FLUSH
10.0000 mL | INTRAVENOUS | Status: DC | PRN
  Administered 2022-08-22: 10 mL

## 2022-08-22 MED ORDER — SODIUM CHLORIDE 0.9 % IV SOLN
2400.0000 mg/m2 | INTRAVENOUS | Status: DC
  Administered 2022-08-22: 5000 mg via INTRAVENOUS
  Filled 2022-08-22: qty 100

## 2022-08-22 NOTE — Progress Notes (Signed)
Nutrition Follow-up:   Patient with right colon cancer.  Patient receiving folfiri and bevacizumab.  Met with patient during infusion.  Reports that her appetite is not that good for a few days following treatment but then improves.  Taste alterations still are an issue.      Medications: reviewed  Labs: reviewed  Anthropometrics:   Weight 213 lb 12.8 oz on 4/22 213 lb 6.4 oz on 3/25 221 lb 9.6 oz on 2/26 222 lb 8 oz on 2/12 248 lb on 10/11  NUTRITION DIAGNOSIS: Inadequate oral intake improving   INTERVENTION:  Continue high calorie, high protein foods to maintain weight    MONITORING, EVALUATION, GOAL: weight trends, intake   NEXT VISIT: Monday, June 3rd during infusion  Meigan Pates B. Freida Busman, RD, LDN Registered Dietitian [phone]

## 2022-08-22 NOTE — Patient Instructions (Signed)
[facility]  Discharge Instructions: Thank you for choosing  Cancer Center to provide your oncology and hematology care.   If you have a lab appointment with the Cancer Center, please go directly to the Cancer Center and check in at the registration area.   Wear comfortable clothing and clothing appropriate for easy access to any Portacath or PICC line.   We strive to give you quality time with your provider. You may need to reschedule your appointment if you arrive late (15 or more minutes).  Arriving late affects you and other patients whose appointments are after yours.  Also, if you miss three or more appointments without notifying the office, you may be dismissed from the clinic at the provider's discretion.      For prescription refill requests, have your pharmacy contact our office and allow 72 hours for refills to be completed.    Today you received the following chemotherapy and/or immunotherapy agents: Bevacizumab, Irinotecan, and Fluorouracil       To help prevent nausea and vomiting after your treatment, we encourage you to take your nausea medication as directed.  BELOW ARE SYMPTOMS THAT SHOULD BE REPORTED IMMEDIATELY: *FEVER GREATER THAN 100.4 F (38 C) OR HIGHER *CHILLS OR SWEATING *NAUSEA AND VOMITING THAT IS NOT CONTROLLED WITH YOUR NAUSEA MEDICATION *UNUSUAL SHORTNESS OF BREATH *UNUSUAL BRUISING OR BLEEDING *URINARY PROBLEMS (pain or burning when urinating, or frequent urination) *BOWEL PROBLEMS (unusual diarrhea, constipation, pain near the anus) TENDERNESS IN MOUTH AND THROAT WITH OR WITHOUT PRESENCE OF ULCERS (sore throat, sores in mouth, or a toothache) UNUSUAL RASH, SWELLING OR PAIN  UNUSUAL VAGINAL DISCHARGE OR ITCHING   Items with * indicate a potential emergency and should be followed up as soon as possible or go to the Emergency Department if any problems should occur.  Please show the CHEMOTHERAPY ALERT CARD or  IMMUNOTHERAPY ALERT CARD at check-in to the Emergency Department and triage nurse.  Should you have questions after your visit or need to cancel or reschedule your appointment, please contact  [facility]  Dept: [phone]  and follow the prompts.  Office hours are 8:00 a.m. to 4:30 p.m. Monday - Friday. Please note that voicemails left after 4:00 p.m. may not be returned until the following business day.  We are closed weekends and major holidays. You have access to a nurse at all times for urgent questions. Please call the main number to the clinic Dept: [phone] and follow the prompts.   For any non-urgent questions, you may also contact your provider using MyChart. We now offer e-Visits for anyone 65 and older to request care online for non-urgent symptoms. For details visit [email or web address].   Also download the MyChart app! Go to the app store, search "MyChart", open the app, select , and log in with your MyChart username and password.

## 2022-08-22 NOTE — Progress Notes (Signed)
[facility]   [phone] [phone]   Clinic Follow up Note   Patient Care Team: [name], MD as PCP - General (Family Medicine) [name], MD as Consulting Physician (General Surgery) [name], MD as Consulting Physician (Hematology) [name], MD as Consulting Physician (General Surgery) [name], MD as Consulting Physician (Hematology and Oncology)  Date of Service:  08/22/2022  CHIEF COMPLAINT: f/u of metastatic colon cancer    CURRENT THERAPY:  FOLFIRI and Beva q14d   ASSESSMENT:  Kim Castro is a 77 y.o. female with   Malignant neoplasm of ascending colon (HCC) pT3N2aM0 stage IIB, MSS, metastatic to b/l ovaries and peritoneum and liver, KRAS mutation G12V (+)  -Initially diagnosed in 08/2019, s/p resection with Dr Maisie Fus on 11/01/19. Path showed overall Stage IIIB cancer.  -s/p 6 months adjuvant FOLFOX, completed 06/01/20, oxaliplatin held for final 2 cycles due to neuropathy.  -s/p BSO and hysterectomy on 12/22/21 with Dr. Pricilla Holm. Path revealed metastatic moderately differentiated colonic adenocarcinoma involving both ovaries and peritoneum -she restarted FOLFOX on 01/24/22. -Due to disease progression, chemotherapy changed to second line FOLFIRI and bevacizumab on April 06, 2022. She has been tolerating moderately well, better now with dose reduction  -Restaging CT scan from June 23, 2022 showed partial response in liver and peritoneal metastasis, no other new lesions.  I reviewed the images and discussed the results with patient.  She is pleased. -She has been tolerating chemotherapy much better lately due to the dose reduction.  We discussed maintenance therapy option in future   Malignant neoplasm of female breast [facility]) -Dx in 05/2014, s/p left lumpectomy with Dr Johna Sheriff, adjuvant RT with Dr [name], and Anastrozole 08/2014 - 01/13/20 under Dr Pamelia Hoit.  -DEXA 09/11/20 T score +1.2 normal.  -most recent  mammogram on 10/04/21 was negative. Will continue yearly  Peripheral neuropathy due to chemotherapy (HCC) -Secondary to oxaliplatin -She is on Lyrica and B complex, continue to monitor.  Overall stable.    DNR (do not resuscitate) -The patient understands the goal of care is palliative.  -pt has agreed with DNR, order placed.     PLAN: -lab reviewed -Order CT scan next visit -proceed with C11 FOLFIRI and Beva labs adequate for treatment -lab/flush and f/u and FOLFIRI 09/05/2022   SUMMARY OF ONCOLOGIC HISTORY: Oncology History Overview Note   Cancer Staging  Malignant neoplasm of female breast [facility]) Staging form: Breast, AJCC 7th Edition - Clinical stage from 06/11/2014: Stage 0 (Tis (DCIS), N0, M0) - Unsigned Staged by: Pathologist and managing physician Laterality: Left Estrogen receptor status: Positive Progesterone receptor status: Positive Stage used in treatment planning: Yes National guidelines used in treatment planning: Yes Type of national guideline used in treatment planning: NCCN - Pathologic stage from 07/03/2014: Stage Unknown (Tis (DCIS), NX, cM0) - Signed by [name], MD on 07/10/2014 Staged by: Pathologist Laterality: Left Estrogen receptor status: Positive Progesterone receptor status: Positive Stage used in treatment planning: Yes National guidelines used in treatment planning: Yes Type of national guideline used in treatment planning: NCCN Staging comments: Staged on final lumpectomy specimen by Dr. [name].  Right colon cancer Staging form: Colon and Rectum, AJCC 8th Edition - Pathologic stage from 11/01/2019: Stage IIIB (pT3, pN2a, cM0) - Signed by [name], MD on 11/29/2019 Stage prefix: Initial diagnosis Histologic grading system: 4 grade system Histologic grade (G): G2 Residual tumor (R): R0 - None Tumor deposits (TD): Absent Perineural invasion (PNI): Absent Microsatellite instability (MSI): Stable KRAS mutation: Unknown  NRAS mutation:  Unknown BRAF mutation: Unknown     Malignant neoplasm of female breast  05/29/2014 Initial Biopsy   Left breast needle core biopsy: Grade 2, DCIS with calcs. ER+ (100%), PR+ (96%).    06/04/2014 Initial Diagnosis   Left breast DCIS with calcifications, ER 100%, PR 96%   06/10/2014 Breast MRI   Left breast: 2.4 x 1.3 x 1.1 cm area of patchy non-mass enhancement upper outer quadrant includes postbiopsy seroma; Right breast: 1.2 cm previously biopsied stable benign fibroadenoma   06/12/2014 Procedure   Genetic counseling/testing: Identified 1 VUS on CHEK2 gene. Remainder of 17 gene panel tested negative and included: ATM, BARD1, BRCA 1/2, BRIP1, CDH1, CHEK2, EPCAM, MLH1, MSH2, MSH6, NBN, NF1, PALB2, PTEN, RAD50, RAD51C, RAD51D, STK11, and TP53.    07/01/2014 Surgery   Left breast lumpectomy ([facility]): Grade 1, DCIS, spanning 2.3 cm, 1 mm margin, ER 100%, PR 96%   07/31/2014 - 08/28/2014 Radiation Therapy   Adjuvant RT completed [name]). Left breast: Total dose 42.5 Gy over 17 fractions. Left breast boost: Total dose 7.5 Gy over 3 fractions.    09/14/2014 - 01/13/2020 Anti-estrogen oral therapy   Anastrazole 1mg  daily. Planned duration of treatment: 5 years [place]). Completed in 01/2020.    09/25/2014 Survivorship   Survivorship Care Plan given to patient and reviewed with her in person.    03/02/2021 Imaging   CT CAP  IMPRESSION: 1. No findings of active/recurrent malignancy. Partial right hemicolectomy. 2. Endometrial stripe remains mildly thickened, but endometrial biopsy in May was negative for malignancy. 3. Progressive endplate sclerosis and endplate irregularity at T2-3, probably due to degenerative endplate findings. If the has referable upper thoracic pain/symptoms then thoracic spine MRI could be used for further workup. 4. Other imaging findings of potential clinical significance: Mild cardiomegaly. Aortic Atherosclerosis (ICD10-I70.0). Mild mitral valve calcification.  Postoperative findings in the left breast with adjacent radiation port anteriorly in the left upper lobe. Tiny pulmonary nodules in the left lower lobe are unchanged from earliest available comparison of 10/17/2019 and probably benign although may merit surveillance. Multilevel lumbar impingement. Mild pelvic floor laxity.   Malignant neoplasm of ascending colon  09/19/2019 Imaging   CT AP W contrast 09/19/19  IMPRESSION Fullness in the cecum, cannot exclude a mass. No evidence for metastatic disease is identified.    09/24/2019 Procedure   Colonoscopy by Dr [name] 09/24/19 IMPRESSION 1. The colon was redundant  2. Mild diverticulosis was noted through the entire examined colon 3. Single 12mm polyp was found in the ascending colon; polypectomy was performed using snare cautery and biopsy forceps 4. Mild diverticulosis was notes in the descending colon and sigmoid colon.  5. Single polyp was found in the sigmoid colon, polypectomy was performed with cold forceps.  6. Single polyp was found in the rectosigmoid colon; polypectomy was performed with cold snare  7. Small internal hemorrhoids  8. Large mass was found at the cecum; multiple biopsies of the area were performed using cold forceps; injection (tattooing) was performed distal to the mass.    09/24/2019 Initial Biopsy   INTERPRETATION AND DIAGNOSIS:  A. Cecum, biopsy:  Invasive moderately differentiated adenocarcinoma.  see comment  B. Polyp @ ascending colon, polypectomy:  Tubular Adenoma  C. Polyp @ sigmoid colon Polypectomy:  hyperplastic polyp.  D. Polyp @ rectosigmoid colon, Polypectomy:  Hyperplastic Polyp      10/16/2019 Imaging   CT Chest IMPRESSION: 1. Multiple small pulmonary nodules measuring 5 mm or less in size in the lungs.  These are nonspecific and are typically considered statistically likely benign. However, given the patient's history of primary malignancy, close attention on follow-up studies is recommended  to ensure stability. 2. Aortic atherosclerosis, in addition to right coronary artery disease. Assessment for potential risk factor modification, dietary therapy or pharmacologic therapy may be warranted, if clinically indicated. 3. There are calcifications of the aortic valve and mitral annulus. Echocardiographic correlation for evaluation of potential valvular dysfunction may be warranted if clinically indicated. 4. Small hiatal hernia.   Aortic Atherosclerosis (ICD10-I70.0).   11/01/2019 Initial Diagnosis   Colon cancer (HCC)   11/01/2019 Surgery   LAPAROSCOPIC PARTIAL COLECTOMY by Dr Maisie Fus and Dr Michaell Cowing   11/01/2019 Pathology Results   FINAL MICROSCOPIC DIAGNOSIS:   A. COLON, PROXIMAL RIGHT, COLECTOMY:  - Invasive colonic adenocarcinoma, 5 cm.  - Tumor invades through the muscularis propria into pericolonic tissues.   - Margins of resection are not involved.  - Metastatic carcinoma in (5) of (13) lymph nodes.  - See oncology table.    MSI Stable  Mismatch repair normal  MLH1 - Preserved nuclear expression (greater 50% tumor expression) MSH2 - Preserved nuclear expression (greater 50% tumor expression) MSH6 - Preserved nuclear expression (greater 50% tumor expression) PMS2 - Preserved nuclear expression (greater 50% tumor expression)   11/01/2019 Cancer Staging   Staging form: Colon and Rectum, AJCC 8th Edition - Pathologic stage from 11/01/2019: Stage IIIB (pT3, pN2a, cM0) - Signed by [name], MD on 11/29/2019   12/10/2019 Procedure   PAC placed 12/10/19   12/17/2019 - 06/01/2020 Chemotherapy   FOLFOX q2weeks starting in 2 weeks starting 12/17/19. Held 01/27/20-02/10/20 due to b/l PE. Oxaliplatin held C11-12 due to neuropathy. Completed on 06/01/20   03/02/2021 Imaging   CT CAP  IMPRESSION: 1. No findings of active/recurrent malignancy. Partial right hemicolectomy. 2. Endometrial stripe remains mildly thickened, but endometrial biopsy in May was negative for malignancy. 3.  Progressive endplate sclerosis and endplate irregularity at T2-3, probably due to degenerative endplate findings. If the has referable upper thoracic pain/symptoms then thoracic spine MRI could be used for further workup. 4. Other imaging findings of potential clinical significance: Mild cardiomegaly. Aortic Atherosclerosis (ICD10-I70.0). Mild mitral valve calcification. Postoperative findings in the left breast with adjacent radiation port anteriorly in the left upper lobe. Tiny pulmonary nodules in the left lower lobe are unchanged from earliest available comparison of 10/17/2019 and probably benign although may merit surveillance. Multilevel lumbar impingement. Mild pelvic floor laxity.   08/26/2021 Imaging   EXAM: CT CHEST, ABDOMEN, AND PELVIS WITH CONTRAST  IMPRESSION: 1. Stable examination without new or progressive findings to suggest local recurrence or metastatic disease within the chest, abdomen, or pelvis. 2. Hepatomegaly with hepatic steatosis. 3. Sigmoid colonic diverticulosis without findings of acute diverticulitis. 4. Similar prominent endplate sclerosis and irregularity at T2-T3 is most consistent with Modic type endplate degenerative changes. However, if patient has referable upper thoracic pain consider further workup with thoracic spine MRI. 5. Similar thickening of the endometrial stripe measuring up to 8 mm, which was previously biopsied with results negative for malignancy. 6. Aortic Atherosclerosis (ICD10-I70.0).   11/10/2021 PET scan   IMPRESSION: 1. LEFT ovary is increased in size and is intensely hypermetabolic. While physiologic hypermetabolic ovarian tissue is not uncommon, the enlargement and asymmetric activity warrants further evaluation. Consider contrast pelvic MRI vs tissue sampling. 2. No evidence of metastatic colorectal carcinoma otherwise. 3. Post RIGHT hemicolectomy anatomy. 4. Evidence of radiation change in the LEFT upper lobe (remote breast  cancer).     12/22/2021 Relapse/Recurrence    FINAL MICROSCOPIC DIAGNOSIS:   A. LEFT OVARY AND FALLOPIAN TUBE, SALPINGO OOPHORECTOMY:  - Metastatic moderately differentiated colonic adenocarcinoma involving left ovary  - Focal ovarian stromal calcification  - Segment of benign fallopian tube   B. UTERUS WITH RIGHT FALLOPIAN TUBE AND OVARY, HYSTERECTOMY AND SALPINGO-OOPHORECTOMY:  - Metastatic moderately differentiated colonic adenocarcinoma involving right ovary  - Focal invasive extensive adenomyosis  - Benign endometrial polyps  - Benign proliferative phase endometrium  - Hydrosalpinx of right fallopian tube  - Focal ovarian stromal calcification   C. PERITONEAL DEPOSITS, ANTERIOR CUL DE SAC, BIOPSY:  - Metastatic mucinous adenocarcinoma, consistent with colorectal primary    COMMENT:  Immunohistochemical stains show that the tumor cells are positive for CK20 and CDX2 while they are negative for CK7 and PAX8, consistent with above interpretation.    01/24/2022 - 03/23/2022 Chemotherapy   Patient is on Treatment Plan : COLORECTAL FOLFOX q14d x 3 months     04/01/2022 Imaging   CT AP IMPRESSION: 1. New omental metastatic disease. 2. Vague hypoattenuating lesion in the inferior right hepatic lobe is new and also worrisome for metastatic disease. 3. Similar small right lower lobe nodules. Recommend continued attention on follow-up. 4. Steatotic enlarged liver. 5.  Aortic atherosclerosis (ICD10-I70.0).   04/06/2022 -  Chemotherapy   Patient is on Treatment Plan : COLORECTAL FOLFIRI + Bevacizumab q14d     04/21/2022 Imaging    IMPRESSION: 1. Stable chest CT. No evidence of metastatic disease. 2. Stable small pulmonary nodules, considered benign based on stability. 3. Stable postsurgical changes in the left breast and anterior left upper lobe radiation changes. 4. Aortic Atherosclerosis (ICD10-I70.0) and Emphysema (ICD10-J43.9).   04/21/2022 Imaging    IMPRESSION: 1.  Small enhancing lesion inferiorly in the right hepatic lobe is typical of metastatic disease and stable from recent abdominal CT. No other definite liver lesions are identified. Mild hepatic steatosis. 2. No other significant abdominal findings. 3. Omental nodularity seen on CT is not well visualized by MRI.    Imaging     06/23/2022 Imaging    IMPRESSION: 1. Hypodense lesion of the inferior right lobe of the liver, hepatic segment VI is diminished in size, consistent with treatment response. 2. Tiny peritoneal and omental nodules identified by prior examination are diminished in size, consistent with treatment response. 3. Multiple tiny pulmonary nodules unchanged, most likely benign and incidental, however continued attention on follow-up warranted in the setting of known metastatic disease. 4. No evidence of new metastatic disease in the chest, abdomen, or pelvis. 5. Status post partial right hemicolectomy and reanastomosis. 6. Diffuse mosaic attenuation of the airspaces, consistent with small airways disease. 7. Hepatomegaly.      INTERVAL HISTORY:  Kim Castro is here for a follow up of metastatic colon cancer   She was last seen by me on 08/08/2022. She presents to the clinic alone. Pt state she has no concerns since last treatment. Pt state she does have diarrhea after treatment , but it only last one day. Pt sate that she has some fatigue .but is tolerable.    All other systems were reviewed with the patient and are negative.  MEDICAL HISTORY:  Past Medical History:  Diagnosis Date   Aortic atherosclerosis    Arthritis    feet, lower back   Basal cell carcinoma    arm   Breast cancer of upper-outer quadrant of left female breast 06/04/2014   Cataract  immature on the left   Colon cancer 08/2019   Diabetes mellitus without complication    Diverticulosis    Dizziness    > 95yrs ago;took Antivert    Family history of anesthesia complication    sister  slow to wake up with anesthesia   Family history of breast cancer    Family history of colon cancer    Family history of uterine cancer    GERD (gastroesophageal reflux disease)    takes occasional TUMs   History of bronchitis    > 11yrs ago   History of colon polyps    History of hiatal hernia    Small noted on CT   History of pulmonary embolus (PE)    Hypertension    takes Losartan daily and HCTZ   Iron deficiency anemia    Joint pain    Numbness    to toes on each foot   Peripheral neuropathy    feet and toes   Personal history of radiation therapy    Pulmonary nodules    Noted on CT   Radiation 07/31/14-08/28/14   Left Breast 20 fxs   Seasonal allergies    takes Claritin prn   Urinary frequency    Vitamin D deficiency    takes VIt D daily    SURGICAL HISTORY: Past Surgical History:  Procedure Laterality Date   BREAST BIOPSY Bilateral    BREAST LUMPECTOMY Left    BREAST LUMPECTOMY WITH RADIOACTIVE SEED LOCALIZATION Left 07/01/2014   Procedure: LEFT BREAST LUMPECTOMY WITH RADIOACTIVE SEED LOCALIZATION;  Surgeon: Glenna Fellows, MD;  Location: [facility];  Service: General;  Laterality: Left;   CATARACT EXTRACTION Right    COLONOSCOPY  09/24/2019   [place]   LAPAROSCOPIC PARTIAL COLECTOMY N/A 11/01/2019   Procedure: LAPAROSCOPIC PARTIAL COLECTOMY;  Surgeon: [name], MD;  Location: [facility];  Service: General;  Laterality: N/A;   POLYPECTOMY     PORTACATH PLACEMENT N/A 12/10/2019   Procedure: INSERTION PORT-A-CATH ULTRASOUND GUIDED IN RIGHT IJ;  Surgeon: [name], MD;  Location: [facility];  Service: General;  Laterality: N/A;   ROBOTIC ASSISTED TOTAL HYSTERECTOMY Bilateral 12/22/2021   Procedure: XI ROBOTIC ASSISTED TOTAL HYSTERECTOMY WITH BILATERAL SALPINGO OOPHORECTOMY;  Surgeon: Carver Fila, MD;  Location: [facility];  Service: Gynecology;  Laterality: Bilateral;   TOTAL KNEE ARTHROPLASTY Left 10/24/2012   Procedure: TOTAL KNEE  ARTHROPLASTY;  Surgeon: Nestor Lewandowsky, MD;  Location: [facility];  Service: Orthopedics;  Laterality: Left;   TOTAL KNEE ARTHROPLASTY Right 01/09/2013   Procedure: TOTAL KNEE ARTHROPLASTY;  Surgeon: Nestor Lewandowsky, MD;  Location: [facility];  Service: Orthopedics;  Laterality: Right;   TUBAL LIGATION      I have reviewed the social history and family history with the patient and they are unchanged from previous note.  ALLERGIES:  is allergic to oxycodone.  MEDICATIONS:  Current Outpatient Medications  Medication Sig Dispense Refill   acetaminophen (TYLENOL) 325 MG tablet Take 650 mg by mouth every 6 (six) hours as needed for moderate pain.     b complex vitamins capsule Take 1 capsule by mouth daily.     Cholecalciferol (VITAMIN D) 2000 UNITS CAPS Take 2,000 Units by mouth daily.      Ibuprofen 200 MG CAPS Take by mouth.     loratadine (CLARITIN) 10 MG tablet Take 10 mg by mouth daily as needed for allergies.     losartan (COZAAR) 25 MG tablet Take 1 tablet (25 mg total) by  mouth daily. 90 tablet 3   metFORMIN (GLUCOPHAGE) 500 MG tablet Take 1 tablet (500 mg total) by mouth in the morning and at bedtime. 180 tablet 3   Multiple Vitamins-Minerals (MULTIVITAMIN WITH MINERALS) tablet Take 1 tablet by mouth daily.     omeprazole (PRILOSEC) 20 MG capsule Take 20 mg by mouth daily before breakfast.      ondansetron (ZOFRAN) 8 MG tablet Take 1 tablet (8 mg total) by mouth every 8 (eight) hours as needed for nausea or vomiting. Start on the third day after chemotherapy. 30 tablet 1   potassium chloride (KLOR-CON) 10 MEQ tablet Take 1 tablet by mouth once daily 30 tablet 0   pravastatin (PRAVACHOL) 10 MG tablet Take 1 tablet (10 mg total) by mouth daily. 90 tablet 3   pregabalin (LYRICA) 25 MG capsule Take 1 capsule (25 mg total) by mouth 2 (two) times daily. 60 capsule 3   prochlorperazine (COMPAZINE) 10 MG tablet Take 1 tablet (10 mg total) by mouth every 6 (six) hours as needed for nausea or vomiting. 30  tablet 1   No current facility-administered medications for this visit.    PHYSICAL EXAMINATION: ECOG PERFORMANCE STATUS: 1 - Symptomatic but completely ambulatory  Vitals:   08/22/22 0840  BP: (!) 153/54  Pulse: 67  Resp: 18  Temp: 97.7 F (36.5 C)  SpO2: 98%   Wt Readings from Last 3 Encounters:  08/22/22 213 lb 12.8 oz (97 kg)  08/16/22 210 lb 3.2 oz (95.3 kg)  08/08/22 214 lb 6.4 oz (97.3 kg)     GENERAL:alert, no distress and comfortable SKIN: skin color normal, no rashes or significant lesions EYES: normal, Conjunctiva are pink and non-injected, sclera clear  NEURO: alert & oriented x 3 with fluent speech    LABORATORY DATA:  I have reviewed the data as listed    Latest Ref Rng & Units 08/22/2022    8:17 AM 08/08/2022    8:30 AM 07/25/2022    8:00 AM  CBC  WBC 4.0 - 10.5 K/uL 12.5  4.1  11.4   Hemoglobin 12.0 - 15.0 g/dL 11.9  14.7  82.9   Hematocrit 36.0 - 46.0 % 35.9  33.3  36.2   Platelets 150 - 400 K/uL 192  233  201         Latest Ref Rng & Units 08/16/2022    9:23 AM 08/08/2022    8:30 AM 07/25/2022    8:00 AM  CMP  Glucose 70 - 99 mg/dL 562  130  865   BUN 8 - 23 mg/dL  21  12   Creatinine 7.84 - 1.00 mg/dL  6.96  2.95   Sodium 284 - 145 mmol/L  142  144   Potassium 3.5 - 5.1 mmol/L  4.1  4.1   Chloride 98 - 111 mmol/L  109  109   CO2 22 - 32 mmol/L  24  27   Calcium 8.9 - 10.3 mg/dL  9.5  9.4   Total Protein 6.5 - 8.1 g/dL  6.4  6.3   Total Bilirubin 0.3 - 1.2 mg/dL  0.8  0.7   Alkaline Phos 38 - 126 U/L  100  137   AST 15 - 41 U/L  24  25   ALT 0 - 44 U/L  18  18       RADIOGRAPHIC STUDIES: I have personally reviewed the radiological images as listed and agreed with the findings in the report. No results found.  No orders of the defined types were placed in this encounter.  All questions were answered. The patient knows to call the clinic with any problems, questions or concerns. No barriers to learning was detected. The total time  spent in the appointment was 25 minutes.     [name], MD 08/22/2022   Carolin Coy, CMA, am acting as scribe for [name], MD.   I have reviewed the above documentation for accuracy and completeness, and I agree with the above.

## 2022-08-24 ENCOUNTER — Inpatient Hospital Stay: Payer: Medicare Other

## 2022-08-24 ENCOUNTER — Other Ambulatory Visit: Payer: Self-pay

## 2022-08-24 VITALS — BP 140/67 | HR 58 | Temp 98.6°F | Resp 18

## 2022-08-24 DIAGNOSIS — Z5189 Encounter for other specified aftercare: Secondary | ICD-10-CM | POA: Diagnosis not present

## 2022-08-24 DIAGNOSIS — C7963 Secondary malignant neoplasm of bilateral ovaries: Secondary | ICD-10-CM | POA: Diagnosis not present

## 2022-08-24 DIAGNOSIS — Z90722 Acquired absence of ovaries, bilateral: Secondary | ICD-10-CM | POA: Diagnosis not present

## 2022-08-24 DIAGNOSIS — G620 Drug-induced polyneuropathy: Secondary | ICD-10-CM | POA: Diagnosis not present

## 2022-08-24 DIAGNOSIS — C182 Malignant neoplasm of ascending colon: Secondary | ICD-10-CM

## 2022-08-24 DIAGNOSIS — C787 Secondary malignant neoplasm of liver and intrahepatic bile duct: Secondary | ICD-10-CM | POA: Diagnosis not present

## 2022-08-24 DIAGNOSIS — Z90710 Acquired absence of both cervix and uterus: Secondary | ICD-10-CM | POA: Diagnosis not present

## 2022-08-24 DIAGNOSIS — Z853 Personal history of malignant neoplasm of breast: Secondary | ICD-10-CM | POA: Diagnosis not present

## 2022-08-24 DIAGNOSIS — Z5111 Encounter for antineoplastic chemotherapy: Secondary | ICD-10-CM | POA: Diagnosis not present

## 2022-08-24 DIAGNOSIS — C786 Secondary malignant neoplasm of retroperitoneum and peritoneum: Secondary | ICD-10-CM | POA: Diagnosis not present

## 2022-08-24 DIAGNOSIS — Z66 Do not resuscitate: Secondary | ICD-10-CM | POA: Diagnosis not present

## 2022-08-24 MED ORDER — HEPARIN SOD (PORK) LOCK FLUSH 100 UNIT/ML IV SOLN
500.0000 [IU] | Freq: Once | INTRAVENOUS | Status: AC | PRN
  Administered 2022-08-24: 500 [IU]

## 2022-08-24 MED ORDER — SODIUM CHLORIDE 0.9% FLUSH
10.0000 mL | INTRAVENOUS | Status: DC | PRN
  Administered 2022-08-24: 10 mL

## 2022-08-25 ENCOUNTER — Telehealth: Payer: Self-pay | Admitting: Family Medicine

## 2022-08-25 ENCOUNTER — Inpatient Hospital Stay: Payer: Medicare Other

## 2022-08-25 ENCOUNTER — Other Ambulatory Visit: Payer: Self-pay | Admitting: Hematology

## 2022-08-25 ENCOUNTER — Encounter: Payer: Self-pay | Admitting: Hematology

## 2022-08-25 ENCOUNTER — Other Ambulatory Visit: Payer: Self-pay

## 2022-08-25 VITALS — BP 143/54 | HR 65 | Temp 98.7°F | Resp 18

## 2022-08-25 DIAGNOSIS — Z66 Do not resuscitate: Secondary | ICD-10-CM | POA: Diagnosis not present

## 2022-08-25 DIAGNOSIS — Z853 Personal history of malignant neoplasm of breast: Secondary | ICD-10-CM | POA: Diagnosis not present

## 2022-08-25 DIAGNOSIS — Z5111 Encounter for antineoplastic chemotherapy: Secondary | ICD-10-CM | POA: Diagnosis not present

## 2022-08-25 DIAGNOSIS — Z5189 Encounter for other specified aftercare: Secondary | ICD-10-CM | POA: Diagnosis not present

## 2022-08-25 DIAGNOSIS — Z90710 Acquired absence of both cervix and uterus: Secondary | ICD-10-CM | POA: Diagnosis not present

## 2022-08-25 DIAGNOSIS — C182 Malignant neoplasm of ascending colon: Secondary | ICD-10-CM

## 2022-08-25 DIAGNOSIS — C786 Secondary malignant neoplasm of retroperitoneum and peritoneum: Secondary | ICD-10-CM | POA: Diagnosis not present

## 2022-08-25 DIAGNOSIS — C7963 Secondary malignant neoplasm of bilateral ovaries: Secondary | ICD-10-CM | POA: Diagnosis not present

## 2022-08-25 DIAGNOSIS — C787 Secondary malignant neoplasm of liver and intrahepatic bile duct: Secondary | ICD-10-CM | POA: Diagnosis not present

## 2022-08-25 DIAGNOSIS — Z90722 Acquired absence of ovaries, bilateral: Secondary | ICD-10-CM | POA: Diagnosis not present

## 2022-08-25 DIAGNOSIS — G620 Drug-induced polyneuropathy: Secondary | ICD-10-CM | POA: Diagnosis not present

## 2022-08-25 MED ORDER — PEGFILGRASTIM-CBQV 6 MG/0.6ML ~~LOC~~ SOSY
6.0000 mg | PREFILLED_SYRINGE | Freq: Once | SUBCUTANEOUS | Status: AC
  Administered 2022-08-25: 6 mg via SUBCUTANEOUS
  Filled 2022-08-25: qty 0.6

## 2022-08-25 NOTE — Telephone Encounter (Signed)
Form placed on providers desk for signature. Dm/cma

## 2022-08-25 NOTE — Telephone Encounter (Signed)
Patient dropped off document Handicap Placard, to be filled out by provider. Patient requested to send it via Call Patient to pick up within 7-days. Document is located in providers tray at front office.Please advise at Mobile [phone] (mobile)

## 2022-08-25 NOTE — Addendum Note (Signed)
Addended by: [name] on: 08/25/2022 10:48 AM   Modules accepted: Orders

## 2022-08-26 NOTE — Telephone Encounter (Signed)
Called patient and advised that form is ready for pick up. She ill pick it up next week.  Placed form up front.  Dm/cma

## 2022-08-31 ENCOUNTER — Encounter: Payer: Self-pay | Admitting: Hematology

## 2022-09-02 MED FILL — Dexamethasone Sodium Phosphate Inj 100 MG/10ML: INTRAMUSCULAR | Qty: 1 | Status: AC

## 2022-09-04 NOTE — Assessment & Plan Note (Signed)
-  The patient understands the goal of care is palliative.  -pt has agreed with DNR, order placed.  

## 2022-09-04 NOTE — Assessment & Plan Note (Signed)
-  Dx in 05/2014, s/p left lumpectomy with Dr Hoxworth, adjuvant RT with Dr Wentworth, and Anastrozole 08/2014 - 01/13/20 under Dr Gudena.  -DEXA 09/11/20 T score +1.2 normal.  -most recent mammogram on 10/04/21 was negative. Will continue yearly  

## 2022-09-04 NOTE — Assessment & Plan Note (Signed)
[id number] stage IIB, MSS, metastatic to b/l ovaries and peritoneum and liver, KRAS mutation G12V (+)  -Initially diagnosed in 08/2019, s/p resection with Dr Thomas on 11/01/19. Path showed overall Stage IIIB cancer.  -s/p 6 months adjuvant FOLFOX, completed 06/01/20, oxaliplatin held for final 2 cycles due to neuropathy.  -s/p BSO and hysterectomy on 12/22/21 with Dr. Tucker. Path revealed metastatic moderately differentiated colonic adenocarcinoma involving both ovaries and peritoneum -she restarted FOLFOX on 01/24/22. -Due to disease progression, chemotherapy changed to second line FOLFIRI and bevacizumab on April 06, 2022. She has been tolerating moderately well, better now with dose reduction  -Restaging CT scan from June 23, 2022 showed partial response in liver and peritoneal metastasis, no other new lesions.  I reviewed the images and discussed the results with patient.  She is pleased. -She has been tolerating chemotherapy much better lately due to the dose reduction.  We discussed maintenance therapy option in future  

## 2022-09-04 NOTE — Assessment & Plan Note (Signed)
-  Secondary to oxaliplatin -She is on Lyrica and B complex, continue to monitor.  Overall stable.   

## 2022-09-05 ENCOUNTER — Inpatient Hospital Stay: Payer: Medicare Other | Attending: Hematology | Admitting: Hematology

## 2022-09-05 ENCOUNTER — Encounter: Payer: Self-pay | Admitting: Hematology

## 2022-09-05 ENCOUNTER — Inpatient Hospital Stay: Payer: Medicare Other

## 2022-09-05 VITALS — BP 142/60 | HR 68 | Temp 97.9°F | Resp 18 | Ht 65.0 in | Wt 212.9 lb

## 2022-09-05 DIAGNOSIS — Z853 Personal history of malignant neoplasm of breast: Secondary | ICD-10-CM | POA: Diagnosis not present

## 2022-09-05 DIAGNOSIS — Z803 Family history of malignant neoplasm of breast: Secondary | ICD-10-CM | POA: Insufficient documentation

## 2022-09-05 DIAGNOSIS — T451X5A Adverse effect of antineoplastic and immunosuppressive drugs, initial encounter: Secondary | ICD-10-CM

## 2022-09-05 DIAGNOSIS — C50919 Malignant neoplasm of unspecified site of unspecified female breast: Secondary | ICD-10-CM

## 2022-09-05 DIAGNOSIS — Z86711 Personal history of pulmonary embolism: Secondary | ICD-10-CM | POA: Diagnosis not present

## 2022-09-05 DIAGNOSIS — C182 Malignant neoplasm of ascending colon: Secondary | ICD-10-CM

## 2022-09-05 DIAGNOSIS — E1142 Type 2 diabetes mellitus with diabetic polyneuropathy: Secondary | ICD-10-CM | POA: Diagnosis not present

## 2022-09-05 DIAGNOSIS — Z66 Do not resuscitate: Secondary | ICD-10-CM | POA: Diagnosis not present

## 2022-09-05 DIAGNOSIS — Z800 Family history of malignant neoplasm of digestive organs: Secondary | ICD-10-CM | POA: Diagnosis not present

## 2022-09-05 DIAGNOSIS — C787 Secondary malignant neoplasm of liver and intrahepatic bile duct: Secondary | ICD-10-CM | POA: Insufficient documentation

## 2022-09-05 DIAGNOSIS — L309 Dermatitis, unspecified: Secondary | ICD-10-CM | POA: Insufficient documentation

## 2022-09-05 DIAGNOSIS — Z170 Estrogen receptor positive status [ER+]: Secondary | ICD-10-CM

## 2022-09-05 DIAGNOSIS — R197 Diarrhea, unspecified: Secondary | ICD-10-CM | POA: Diagnosis not present

## 2022-09-05 DIAGNOSIS — Z5189 Encounter for other specified aftercare: Secondary | ICD-10-CM | POA: Insufficient documentation

## 2022-09-05 DIAGNOSIS — C786 Secondary malignant neoplasm of retroperitoneum and peritoneum: Secondary | ICD-10-CM | POA: Diagnosis not present

## 2022-09-05 DIAGNOSIS — G620 Drug-induced polyneuropathy: Secondary | ICD-10-CM

## 2022-09-05 DIAGNOSIS — Z8049 Family history of malignant neoplasm of other genital organs: Secondary | ICD-10-CM | POA: Diagnosis not present

## 2022-09-05 DIAGNOSIS — I10 Essential (primary) hypertension: Secondary | ICD-10-CM | POA: Insufficient documentation

## 2022-09-05 DIAGNOSIS — Z5111 Encounter for antineoplastic chemotherapy: Secondary | ICD-10-CM | POA: Diagnosis not present

## 2022-09-05 DIAGNOSIS — Z90710 Acquired absence of both cervix and uterus: Secondary | ICD-10-CM | POA: Diagnosis not present

## 2022-09-05 DIAGNOSIS — G8929 Other chronic pain: Secondary | ICD-10-CM | POA: Diagnosis not present

## 2022-09-05 DIAGNOSIS — Z95828 Presence of other vascular implants and grafts: Secondary | ICD-10-CM

## 2022-09-05 LAB — CBC WITH DIFFERENTIAL (CANCER CENTER ONLY)
Abs Immature Granulocytes: 1.1 10*3/uL — ABNORMAL HIGH (ref 0.00–0.07)
Basophils Absolute: 0.1 10*3/uL (ref 0.0–0.1)
Basophils Relative: 1 %
Eosinophils Absolute: 0.3 10*3/uL (ref 0.0–0.5)
Eosinophils Relative: 2 %
HCT: 35.9 % — ABNORMAL LOW (ref 36.0–46.0)
Hemoglobin: 11.2 g/dL — ABNORMAL LOW (ref 12.0–15.0)
Immature Granulocytes: 7 %
Lymphocytes Relative: 23 %
Lymphs Abs: 3.8 10*3/uL (ref 0.7–4.0)
MCH: 32.1 pg (ref 26.0–34.0)
MCHC: 31.2 g/dL (ref 30.0–36.0)
MCV: 102.9 fL — ABNORMAL HIGH (ref 80.0–100.0)
Monocytes Absolute: 1 10*3/uL (ref 0.1–1.0)
Monocytes Relative: 6 %
Neutro Abs: 9.8 10*3/uL — ABNORMAL HIGH (ref 1.7–7.7)
Neutrophils Relative %: 61 %
Platelet Count: 264 10*3/uL (ref 150–400)
RBC: 3.49 MIL/uL — ABNORMAL LOW (ref 3.87–5.11)
RDW: 16.5 % — ABNORMAL HIGH (ref 11.5–15.5)
Smear Review: NORMAL
WBC Count: 16.1 10*3/uL — ABNORMAL HIGH (ref 4.0–10.5)
nRBC: 0.2 % (ref 0.0–0.2)

## 2022-09-05 LAB — CMP (CANCER CENTER ONLY)
ALT: 16 U/L (ref 0–44)
AST: 24 U/L (ref 15–41)
Albumin: 4 g/dL (ref 3.5–5.0)
Alkaline Phosphatase: 133 U/L — ABNORMAL HIGH (ref 38–126)
Anion gap: 7 (ref 5–15)
BUN: 20 mg/dL (ref 8–23)
CO2: 27 mmol/L (ref 22–32)
Calcium: 9.3 mg/dL (ref 8.9–10.3)
Chloride: 107 mmol/L (ref 98–111)
Creatinine: 0.85 mg/dL (ref 0.44–1.00)
GFR, Estimated: >60 mL/min (ref >60)
Glucose, Bld: 126 mg/dL — ABNORMAL HIGH (ref 70–99)
Potassium: 4 mmol/L (ref 3.5–5.1)
Sodium: 141 mmol/L (ref 135–145)
Total Bilirubin: 0.6 mg/dL (ref 0.3–1.2)
Total Protein: 6.5 g/dL (ref 6.5–8.1)

## 2022-09-05 LAB — TOTAL PROTEIN, URINE DIPSTICK: Protein, ur: NEGATIVE mg/dL

## 2022-09-05 MED ORDER — PALONOSETRON HCL INJECTION 0.25 MG/5ML
0.2500 mg | Freq: Once | INTRAVENOUS | Status: AC
  Administered 2022-09-05: 0.25 mg via INTRAVENOUS
  Filled 2022-09-05: qty 5

## 2022-09-05 MED ORDER — SODIUM CHLORIDE 0.9 % IV SOLN
10.0000 mg | Freq: Once | INTRAVENOUS | Status: AC
  Administered 2022-09-05: 10 mg via INTRAVENOUS
  Filled 2022-09-05: qty 10

## 2022-09-05 MED ORDER — SODIUM CHLORIDE 0.9 % IV SOLN
2400.0000 mg/m2 | INTRAVENOUS | Status: DC
  Administered 2022-09-05: 5000 mg via INTRAVENOUS
  Filled 2022-09-05: qty 100

## 2022-09-05 MED ORDER — SODIUM CHLORIDE 0.9% FLUSH
10.0000 mL | INTRAVENOUS | Status: DC | PRN
  Administered 2022-09-05: 10 mL

## 2022-09-05 MED ORDER — SODIUM CHLORIDE 0.9 % IV SOLN
100.0000 mg/m2 | Freq: Once | INTRAVENOUS | Status: AC
  Administered 2022-09-05: 220 mg via INTRAVENOUS
  Filled 2022-09-05: qty 11

## 2022-09-05 MED ORDER — SODIUM CHLORIDE 0.9 % IV SOLN
5.0000 mg/kg | Freq: Once | INTRAVENOUS | Status: AC
  Administered 2022-09-05: 500 mg via INTRAVENOUS
  Filled 2022-09-05: qty 4

## 2022-09-05 MED ORDER — SODIUM CHLORIDE 0.9 % IV SOLN: Freq: Once | INTRAVENOUS | Status: AC

## 2022-09-05 MED ORDER — POTASSIUM CHLORIDE ER 10 MEQ PO TBCR: 10.0000 meq | EXTENDED_RELEASE_TABLET | Freq: Every day | ORAL | 0 refills | Status: DC

## 2022-09-05 MED ORDER — ATROPINE SULFATE 1 MG/ML IV SOLN
0.5000 mg | Freq: Once | INTRAVENOUS | Status: AC | PRN
  Administered 2022-09-05: 0.5 mg via INTRAVENOUS

## 2022-09-05 MED ORDER — SODIUM CHLORIDE 0.9 % IV SOLN
400.0000 mg/m2 | Freq: Once | INTRAVENOUS | Status: AC
  Administered 2022-09-05: 896 mg via INTRAVENOUS
  Filled 2022-09-05: qty 44.8

## 2022-09-05 NOTE — Patient Instructions (Signed)
[facility]  Discharge Instructions: Thank you for choosing [facility] to provide your oncology and hematology care.   If you have a lab appointment with the Cancer Center, please go directly to the Cancer Center and check in at the registration area.   Wear comfortable clothing and clothing appropriate for easy access to any Portacath or PICC line.   We strive to give you quality time with your provider. You may need to reschedule your appointment if you arrive late (15 or more minutes).  Arriving late affects you and other patients whose appointments are after yours.  Also, if you miss three or more appointments without notifying the office, you may be dismissed from the clinic at the provider's discretion.      For prescription refill requests, have your pharmacy contact our office and allow 72 hours for refills to be completed.    Today you received the following chemotherapy and/or immunotherapy agents: Bevacizumab, Irinotecan, and Fluorouracil       To help prevent nausea and vomiting after your treatment, we encourage you to take your nausea medication as directed.  BELOW ARE SYMPTOMS THAT SHOULD BE REPORTED IMMEDIATELY: *FEVER GREATER THAN 100.4 F (38 C) OR HIGHER *CHILLS OR SWEATING *NAUSEA AND VOMITING THAT IS NOT CONTROLLED WITH YOUR NAUSEA MEDICATION *UNUSUAL SHORTNESS OF BREATH *UNUSUAL BRUISING OR BLEEDING *URINARY PROBLEMS (pain or burning when urinating, or frequent urination) *BOWEL PROBLEMS (unusual diarrhea, constipation, pain near the anus) TENDERNESS IN MOUTH AND THROAT WITH OR WITHOUT PRESENCE OF ULCERS (sore throat, sores in mouth, or a toothache) UNUSUAL RASH, SWELLING OR PAIN  UNUSUAL VAGINAL DISCHARGE OR ITCHING   Items with * indicate a potential emergency and should be followed up as soon as possible or go to the Emergency Department if any problems should occur.  Please show the CHEMOTHERAPY ALERT CARD or  IMMUNOTHERAPY ALERT CARD at check-in to the Emergency Department and triage nurse.  Should you have questions after your visit or need to cancel or reschedule your appointment, please contact [facility]  Dept: [phone]  and follow the prompts.  Office hours are 8:00 a.m. to 4:30 p.m. Monday - Friday. Please note that voicemails left after 4:00 p.m. may not be returned until the following business day.  We are closed weekends and major holidays. You have access to a nurse at all times for urgent questions. Please call the main number to the clinic Dept: [phone] and follow the prompts.   For any non-urgent questions, you may also contact your provider using MyChart. We now offer e-Visits for anyone 18 and older to request care online for non-urgent symptoms. For details visit [email or web address].   Also download the MyChart app! Go to the app store, search "MyChart", open the app, select [facility], and log in with your MyChart username and password.   

## 2022-09-05 NOTE — Progress Notes (Signed)
[facility]   [phone] Fax:([phone]) [phone]   Clinic Follow up Note   Patient Care Team: [name], MD as PCP - General (Family Medicine) [name], MD as Consulting Physician (General Surgery) [name], MD as Consulting Physician (Hematology) [name], MD as Consulting Physician (General Surgery) [name], MD as Consulting Physician (Hematology and Oncology)  Date of Service:  09/05/2022  CHIEF COMPLAINT: f/u of metastatic colon cancer   CURRENT THERAPY: FOLFIRI and Beva q14d    ASSESSMENT: Kim Castro is a 77 y.o. female with   Malignant neoplasm of ascending colon (HCC) pT3N2aM0 stage IIB, MSS, metastatic to b/l ovaries and peritoneum and liver, KRAS mutation G12V (+)  -Initially diagnosed in 08/2019, s/p resection with Dr Maisie Fus on 11/01/19. Path showed overall Stage IIIB cancer.  -s/p 6 months adjuvant FOLFOX, completed 06/01/20, oxaliplatin held for final 2 cycles due to neuropathy.  -s/p BSO and hysterectomy on 12/22/21 with Dr. Pricilla Holm. Path revealed metastatic moderately differentiated colonic adenocarcinoma involving both ovaries and peritoneum -she restarted FOLFOX on 01/24/22. -Due to disease progression, chemotherapy changed to second line FOLFIRI and bevacizumab on April 06, 2022. She has been tolerating moderately well, better now with dose reduction  -Restaging CT scan from June 23, 2022 showed partial response in liver and peritoneal metastasis, no other new lesions.  I reviewed the images and discussed the results with patient.  She is pleased. -She has been tolerating chemotherapy much better lately due to the dose reduction.  We discussed maintenance therapy option in future   Malignant neoplasm of female breast [facility]) -Dx in 05/2014, s/p left lumpectomy with Dr Johna Sheriff, adjuvant RT with Dr [name], and Anastrozole 08/2014 - 01/13/20 under Dr Pamelia Hoit.  -DEXA 09/11/20 T score +1.2 normal.  -most recent  mammogram on 10/04/21 was negative. Will continue yearly  DNR (do not resuscitate) -The patient understands the goal of care is palliative.  -pt has agreed with DNR, order placed.     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  Overall stable.       PLAN: -lab reviewed -I refill Potassium  - Ct CAP order 6/3 -proceed with FOLFIRI and Beva at same dose  -lab/flush and f/u in 09/20/2022   SUMMARY OF ONCOLOGIC HISTORY: Oncology History Overview Note   Cancer Staging  Malignant neoplasm of female breast [facility]) Staging form: Breast, AJCC 7th Edition - Clinical stage from 06/11/2014: Stage 0 (Tis (DCIS), N0, M0) - Unsigned Staged by: Pathologist and managing physician Laterality: Left Estrogen receptor status: Positive Progesterone receptor status: Positive Stage used in treatment planning: Yes National guidelines used in treatment planning: Yes Type of national guideline used in treatment planning: NCCN - Pathologic stage from 07/03/2014: Stage Unknown (Tis (DCIS), NX, cM0) - Signed by [name], MD on 07/10/2014 Staged by: Pathologist Laterality: Left Estrogen receptor status: Positive Progesterone receptor status: Positive Stage used in treatment planning: Yes National guidelines used in treatment planning: Yes Type of national guideline used in treatment planning: NCCN Staging comments: Staged on final lumpectomy specimen by Dr. [name].  Right colon cancer Staging form: Colon and Rectum, AJCC 8th Edition - Pathologic stage from 11/01/2019: Stage IIIB (pT3, pN2a, cM0) - Signed by [name], MD on 11/29/2019 Stage prefix: Initial diagnosis Histologic grading system: 4 grade system Histologic grade (G): G2 Residual tumor (R): R0 - None Tumor deposits (TD): Absent Perineural invasion (PNI): Absent Microsatellite instability (MSI): Stable  KRAS mutation: Unknown NRAS mutation: Unknown BRAF  mutation: Unknown     Malignant neoplasm of female breast (HCC)  05/29/2014 Initial Biopsy   Left breast needle core biopsy: Grade 2, DCIS with calcs. ER+ (100%), PR+ (96%).    06/04/2014 Initial Diagnosis   Left breast DCIS with calcifications, ER 100%, PR 96%   06/10/2014 Breast MRI   Left breast: 2.4 x 1.3 x 1.1 cm area of patchy non-mass enhancement upper outer quadrant includes postbiopsy seroma; Right breast: 1.2 cm previously biopsied stable benign fibroadenoma   06/12/2014 Procedure   Genetic counseling/testing: Identified 1 VUS on CHEK2 gene. Remainder of 17 gene panel tested negative and included: ATM, BARD1, BRCA 1/2, BRIP1, CDH1, CHEK2, EPCAM, MLH1, MSH2, MSH6, NBN, NF1, PALB2, PTEN, RAD50, RAD51C, RAD51D, STK11, and TP53.    07/01/2014 Surgery   Left breast lumpectomy ([facility]): Grade 1, DCIS, spanning 2.3 cm, 1 mm margin, ER 100%, PR 96%   07/31/2014 - 08/28/2014 Radiation Therapy   Adjuvant RT completed [name]). Left breast: Total dose 42.5 Gy over 17 fractions. Left breast boost: Total dose 7.5 Gy over 3 fractions.    09/14/2014 - 01/13/2020 Anti-estrogen oral therapy   Anastrazole 1mg  daily. Planned duration of treatment: 5 years [place]). Completed in 01/2020.    09/25/2014 Survivorship   Survivorship Care Plan given to patient and reviewed with her in person.    03/02/2021 Imaging   CT CAP  IMPRESSION: 1. No findings of active/recurrent malignancy. Partial right hemicolectomy. 2. Endometrial stripe remains mildly thickened, but endometrial biopsy in May was negative for malignancy. 3. Progressive endplate sclerosis and endplate irregularity at T2-3, probably due to degenerative endplate findings. If the has referable upper thoracic pain/symptoms then thoracic spine MRI could be used for further workup. 4. Other imaging findings of potential clinical significance: Mild cardiomegaly. Aortic Atherosclerosis (ICD10-I70.0). Mild mitral valve calcification. Postoperative  findings in the left breast with adjacent radiation port anteriorly in the left upper lobe. Tiny pulmonary nodules in the left lower lobe are unchanged from earliest available comparison of 10/17/2019 and probably benign although may merit surveillance. Multilevel lumbar impingement. Mild pelvic floor laxity.   Malignant neoplasm of ascending colon (HCC)  09/19/2019 Imaging   CT AP W contrast 09/19/19  IMPRESSION Fullness in the cecum, cannot exclude a mass. No evidence for metastatic disease is identified.    09/24/2019 Procedure   Colonoscopy by Dr [name] 09/24/19 IMPRESSION 1. The colon was redundant  2. Mild diverticulosis was noted through the entire examined colon 3. Single 12mm polyp was found in the ascending colon; polypectomy was performed using snare cautery and biopsy forceps 4. Mild diverticulosis was notes in the descending colon and sigmoid colon.  5. Single polyp was found in the sigmoid colon, polypectomy was performed with cold forceps.  6. Single polyp was found in the rectosigmoid colon; polypectomy was performed with cold snare  7. Small internal hemorrhoids  8. Large mass was found at the cecum; multiple biopsies of the area were performed using cold forceps; injection (tattooing) was performed distal to the mass.    09/24/2019 Initial Biopsy   INTERPRETATION AND DIAGNOSIS:  A. Cecum, biopsy:  Invasive moderately differentiated adenocarcinoma.  see comment  B. Polyp @ ascending colon, polypectomy:  Tubular Adenoma  C. Polyp @ sigmoid colon Polypectomy:  hyperplastic polyp.  D. Polyp @ rectosigmoid colon, Polypectomy:  Hyperplastic Polyp      10/16/2019 Imaging   CT Chest IMPRESSION: 1. Multiple small pulmonary nodules measuring 5 mm or less  in size in the lungs. These are nonspecific and are typically considered statistically likely benign. However, given the patient's history of primary malignancy, close attention on follow-up studies is recommended to ensure  stability. 2. Aortic atherosclerosis, in addition to right coronary artery disease. Assessment for potential risk factor modification, dietary therapy or pharmacologic therapy may be warranted, if clinically indicated. 3. There are calcifications of the aortic valve and mitral annulus. Echocardiographic correlation for evaluation of potential valvular dysfunction may be warranted if clinically indicated. 4. Small hiatal hernia.   Aortic Atherosclerosis (ICD10-I70.0).   11/01/2019 Initial Diagnosis   Colon cancer (HCC)   11/01/2019 Surgery   LAPAROSCOPIC PARTIAL COLECTOMY by Dr Maisie Fus and Dr Michaell Cowing   11/01/2019 Pathology Results   FINAL MICROSCOPIC DIAGNOSIS:   A. COLON, PROXIMAL RIGHT, COLECTOMY:  - Invasive colonic adenocarcinoma, 5 cm.  - Tumor invades through the muscularis propria into pericolonic tissues.   - Margins of resection are not involved.  - Metastatic carcinoma in (5) of (13) lymph nodes.  - See oncology table.    MSI Stable  Mismatch repair normal  MLH1 - Preserved nuclear expression (greater 50% tumor expression) MSH2 - Preserved nuclear expression (greater 50% tumor expression) MSH6 - Preserved nuclear expression (greater 50% tumor expression) PMS2 - Preserved nuclear expression (greater 50% tumor expression)   11/01/2019 Cancer Staging   Staging form: Colon and Rectum, AJCC 8th Edition - Pathologic stage from 11/01/2019: Stage IIIB (pT3, pN2a, cM0) - Signed by [name], MD on 11/29/2019   12/10/2019 Procedure   PAC placed 12/10/19   12/17/2019 - 06/01/2020 Chemotherapy   FOLFOX q2weeks starting in 2 weeks starting 12/17/19. Held 01/27/20-02/10/20 due to b/l PE. Oxaliplatin held C11-12 due to neuropathy. Completed on 06/01/20   03/02/2021 Imaging   CT CAP  IMPRESSION: 1. No findings of active/recurrent malignancy. Partial right hemicolectomy. 2. Endometrial stripe remains mildly thickened, but endometrial biopsy in May was negative for malignancy. 3. Progressive  endplate sclerosis and endplate irregularity at T2-3, probably due to degenerative endplate findings. If the has referable upper thoracic pain/symptoms then thoracic spine MRI could be used for further workup. 4. Other imaging findings of potential clinical significance: Mild cardiomegaly. Aortic Atherosclerosis (ICD10-I70.0). Mild mitral valve calcification. Postoperative findings in the left breast with adjacent radiation port anteriorly in the left upper lobe. Tiny pulmonary nodules in the left lower lobe are unchanged from earliest available comparison of 10/17/2019 and probably benign although may merit surveillance. Multilevel lumbar impingement. Mild pelvic floor laxity.   08/26/2021 Imaging   EXAM: CT CHEST, ABDOMEN, AND PELVIS WITH CONTRAST  IMPRESSION: 1. Stable examination without new or progressive findings to suggest local recurrence or metastatic disease within the chest, abdomen, or pelvis. 2. Hepatomegaly with hepatic steatosis. 3. Sigmoid colonic diverticulosis without findings of acute diverticulitis. 4. Similar prominent endplate sclerosis and irregularity at T2-T3 is most consistent with Modic type endplate degenerative changes. However, if patient has referable upper thoracic pain consider further workup with thoracic spine MRI. 5. Similar thickening of the endometrial stripe measuring up to 8 mm, which was previously biopsied with results negative for malignancy. 6. Aortic Atherosclerosis (ICD10-I70.0).   11/10/2021 PET scan   IMPRESSION: 1. LEFT ovary is increased in size and is intensely hypermetabolic. While physiologic hypermetabolic ovarian tissue is not uncommon, the enlargement and asymmetric activity warrants further evaluation. Consider contrast pelvic MRI vs tissue sampling. 2. No evidence of metastatic colorectal carcinoma otherwise. 3. Post RIGHT hemicolectomy anatomy. 4. Evidence of radiation change in the LEFT  upper lobe (remote breast cancer).      12/22/2021 Relapse/Recurrence    FINAL MICROSCOPIC DIAGNOSIS:   A. LEFT OVARY AND FALLOPIAN TUBE, SALPINGO OOPHORECTOMY:  - Metastatic moderately differentiated colonic adenocarcinoma involving left ovary  - Focal ovarian stromal calcification  - Segment of benign fallopian tube   B. UTERUS WITH RIGHT FALLOPIAN TUBE AND OVARY, HYSTERECTOMY AND SALPINGO-OOPHORECTOMY:  - Metastatic moderately differentiated colonic adenocarcinoma involving right ovary  - Focal invasive extensive adenomyosis  - Benign endometrial polyps  - Benign proliferative phase endometrium  - Hydrosalpinx of right fallopian tube  - Focal ovarian stromal calcification   C. PERITONEAL DEPOSITS, ANTERIOR CUL DE SAC, BIOPSY:  - Metastatic mucinous adenocarcinoma, consistent with colorectal primary    COMMENT:  Immunohistochemical stains show that the tumor cells are positive for CK20 and CDX2 while they are negative for CK7 and PAX8, consistent with above interpretation.    01/24/2022 - 03/23/2022 Chemotherapy   Patient is on Treatment Plan : COLORECTAL FOLFOX q14d x 3 months     04/01/2022 Imaging   CT AP IMPRESSION: 1. New omental metastatic disease. 2. Vague hypoattenuating lesion in the inferior right hepatic lobe is new and also worrisome for metastatic disease. 3. Similar small right lower lobe nodules. Recommend continued attention on follow-up. 4. Steatotic enlarged liver. 5.  Aortic atherosclerosis (ICD10-I70.0).   04/06/2022 -  Chemotherapy   Patient is on Treatment Plan : COLORECTAL FOLFIRI + Bevacizumab q14d     04/21/2022 Imaging    IMPRESSION: 1. Stable chest CT. No evidence of metastatic disease. 2. Stable small pulmonary nodules, considered benign based on stability. 3. Stable postsurgical changes in the left breast and anterior left upper lobe radiation changes. 4. Aortic Atherosclerosis (ICD10-I70.0) and Emphysema (ICD10-J43.9).   04/21/2022 Imaging    IMPRESSION: 1. Small enhancing  lesion inferiorly in the right hepatic lobe is typical of metastatic disease and stable from recent abdominal CT. No other definite liver lesions are identified. Mild hepatic steatosis. 2. No other significant abdominal findings. 3. Omental nodularity seen on CT is not well visualized by MRI.    Imaging     06/23/2022 Imaging    IMPRESSION: 1. Hypodense lesion of the inferior right lobe of the liver, hepatic segment VI is diminished in size, consistent with treatment response. 2. Tiny peritoneal and omental nodules identified by prior examination are diminished in size, consistent with treatment response. 3. Multiple tiny pulmonary nodules unchanged, most likely benign and incidental, however continued attention on follow-up warranted in the setting of known metastatic disease. 4. No evidence of new metastatic disease in the chest, abdomen, or pelvis. 5. Status post partial right hemicolectomy and reanastomosis. 6. Diffuse mosaic attenuation of the airspaces, consistent with small airways disease. 7. Hepatomegaly.      INTERVAL HISTORY:  Kim Castro is here for a follow up of metastatic colon cancer . She was last seen by me on 08/22/2022. She presents to the clinic alone. Pt state she still has some diarrhea and states Imodium if needed. Pt state that her energy level is the same. Pt still keeping grand kids. Pt state that her neuropathy is about the same.     All other systems were reviewed with the patient and are negative.  MEDICAL HISTORY:  Past Medical History:  Diagnosis Date   Aortic atherosclerosis (HCC)    Arthritis    feet, lower back   Basal cell carcinoma    arm   Breast cancer of upper-outer quadrant of left female  breast (HCC) 06/04/2014   Cataract    immature on the left   Colon cancer (HCC) 08/2019   Diabetes mellitus without complication (HCC)    Diverticulosis    Dizziness    > 44yrs ago;took Antivert    Family history of anesthesia  complication    sister slow to wake up with anesthesia   Family history of breast cancer    Family history of colon cancer    Family history of uterine cancer    GERD (gastroesophageal reflux disease)    takes occasional TUMs   History of bronchitis    > 64yrs ago   History of colon polyps    History of hiatal hernia    Small noted on CT   History of pulmonary embolus (PE)    Hypertension    takes Losartan daily and HCTZ   Iron deficiency anemia    Joint pain    Numbness    to toes on each foot   Peripheral neuropathy    feet and toes   Personal history of radiation therapy    Pulmonary nodules    Noted on CT   Radiation 07/31/14-08/28/14   Left Breast 20 fxs   Seasonal allergies    takes Claritin prn   Urinary frequency    Vitamin D deficiency    takes VIt D daily    SURGICAL HISTORY: Past Surgical History:  Procedure Laterality Date   BREAST BIOPSY Bilateral    BREAST LUMPECTOMY Left    BREAST LUMPECTOMY WITH RADIOACTIVE SEED LOCALIZATION Left 07/01/2014   Procedure: LEFT BREAST LUMPECTOMY WITH RADIOACTIVE SEED LOCALIZATION;  Surgeon: Glenna Fellows, MD;  Location: [facility];  Service: General;  Laterality: Left;   CATARACT EXTRACTION Right    COLONOSCOPY  09/24/2019   [place]   LAPAROSCOPIC PARTIAL COLECTOMY N/A 11/01/2019   Procedure: LAPAROSCOPIC PARTIAL COLECTOMY;  Surgeon: [name], MD;  Location: [facility];  Service: General;  Laterality: N/A;   POLYPECTOMY     PORTACATH PLACEMENT N/A 12/10/2019   Procedure: INSERTION PORT-A-CATH ULTRASOUND GUIDED IN RIGHT IJ;  Surgeon: [name], MD;  Location: [facility];  Service: General;  Laterality: N/A;   ROBOTIC ASSISTED TOTAL HYSTERECTOMY Bilateral 12/22/2021   Procedure: XI ROBOTIC ASSISTED TOTAL HYSTERECTOMY WITH BILATERAL SALPINGO OOPHORECTOMY;  Surgeon: Carver Fila, MD;  Location: [facility];  Service: Gynecology;  Laterality: Bilateral;   TOTAL KNEE ARTHROPLASTY Left 10/24/2012    Procedure: TOTAL KNEE ARTHROPLASTY;  Surgeon: Nestor Lewandowsky, MD;  Location: [facility];  Service: Orthopedics;  Laterality: Left;   TOTAL KNEE ARTHROPLASTY Right 01/09/2013   Procedure: TOTAL KNEE ARTHROPLASTY;  Surgeon: Nestor Lewandowsky, MD;  Location: [facility];  Service: Orthopedics;  Laterality: Right;   TUBAL LIGATION      I have reviewed the social history and family history with the patient and they are unchanged from previous note.  ALLERGIES:  is allergic to oxycodone.  MEDICATIONS:  Current Outpatient Medications  Medication Sig Dispense Refill   acetaminophen (TYLENOL) 325 MG tablet Take 650 mg by mouth every 6 (six) hours as needed for moderate pain.     b complex vitamins capsule Take 1 capsule by mouth daily.     Cholecalciferol (VITAMIN D) 2000 UNITS CAPS Take 2,000 Units by mouth daily.      Ibuprofen 200 MG CAPS Take by mouth.     loratadine (CLARITIN) 10 MG tablet Take 10 mg by mouth daily as needed for allergies.     losartan (  COZAAR) 25 MG tablet Take 1 tablet (25 mg total) by mouth daily. 90 tablet 3   metFORMIN (GLUCOPHAGE) 500 MG tablet Take 1 tablet (500 mg total) by mouth in the morning and at bedtime. 180 tablet 3   Multiple Vitamins-Minerals (MULTIVITAMIN WITH MINERALS) tablet Take 1 tablet by mouth daily.     omeprazole (PRILOSEC) 20 MG capsule Take 20 mg by mouth daily before breakfast.      ondansetron (ZOFRAN) 8 MG tablet Take 1 tablet (8 mg total) by mouth every 8 (eight) hours as needed for nausea or vomiting. Start on the third day after chemotherapy. 30 tablet 1   potassium chloride (KLOR-CON) 10 MEQ tablet Take 1 tablet (10 mEq total) by mouth daily. 30 tablet 0   pravastatin (PRAVACHOL) 10 MG tablet Take 1 tablet (10 mg total) by mouth daily. 90 tablet 3   pregabalin (LYRICA) 25 MG capsule Take 1 capsule (25 mg total) by mouth 2 (two) times daily. 60 capsule 3   prochlorperazine (COMPAZINE) 10 MG tablet Take 1 tablet (10 mg total) by mouth every 6 (six) hours as  needed for nausea or vomiting. 30 tablet 1   No current facility-administered medications for this visit.    PHYSICAL EXAMINATION: ECOG PERFORMANCE STATUS: 1 - Symptomatic but completely ambulatory  Vitals:   09/05/22 0837  BP: (!) 142/60  Pulse: 68  Resp: 18  Temp: 97.9 F (36.6 C)  SpO2: 99%   Wt Readings from Last 3 Encounters:  09/05/22 212 lb 14.4 oz (96.6 kg)  08/22/22 213 lb 12.8 oz (97 kg)  08/16/22 210 lb 3.2 oz (95.3 kg)     GENERAL:alert, no distress and comfortable SKIN: skin color normal, no rashes or significant lesions EYES: normal, Conjunctiva are pink and non-injected, sclera clear  NEURO: alert & oriented x 3 with fluent speech LUNGS: (-) clear to auscultation and percussion with normal breathing effort HEART: (-) regular rate & rhythm and no murmurs and(-)  no lower extremity edema  LABORATORY DATA:  I have reviewed the data as listed    Latest Ref Rng & Units 09/05/2022    8:09 AM 08/22/2022    8:17 AM 08/08/2022    8:30 AM  CBC  WBC 4.0 - 10.5 K/uL 16.1  12.5  4.1   Hemoglobin 12.0 - 15.0 g/dL 16.1  09.6  04.5   Hematocrit 36.0 - 46.0 % 35.9  35.9  33.3   Platelets 150 - 400 K/uL 264  192  233         Latest Ref Rng & Units 09/05/2022    8:09 AM 08/22/2022    8:17 AM 08/16/2022    9:23 AM  CMP  Glucose 70 - 99 mg/dL 409  811  914   BUN 8 - 23 mg/dL 20  16    Creatinine 7.82 - 1.00 mg/dL 9.56  2.13    Sodium 086 - 145 mmol/L 141  144    Potassium 3.5 - 5.1 mmol/L 4.0  3.9    Chloride 98 - 111 mmol/L 107  111    CO2 22 - 32 mmol/L 27  25    Calcium 8.9 - 10.3 mg/dL 9.3  9.5    Total Protein 6.5 - 8.1 g/dL 6.5  6.7    Total Bilirubin 0.3 - 1.2 mg/dL 0.6  0.6    Alkaline Phos 38 - 126 U/L 133  122    AST 15 - 41 U/L 24  22    ALT 0 -  44 U/L 16  16        RADIOGRAPHIC STUDIES: I have personally reviewed the radiological images as listed and agreed with the findings in the report. No results found.    Orders Placed This Encounter   Procedures   CT CHEST ABDOMEN PELVIS W CONTRAST    Standing Status:   Future    Standing Expiration Date:   09/05/2023    Order Specific Question:   If indicated for the ordered procedure, I authorize the administration of contrast media per Radiology protocol    Answer:   Yes    Order Specific Question:   Does the patient have a contrast media/X-ray dye allergy?    Answer:   No    Order Specific Question:   Preferred imaging location?    Answer:   [facility]    Order Specific Question:   Release to patient    Answer:   Immediate    Order Specific Question:   If indicated for the ordered procedure, I authorize the administration of oral contrast media per Radiology protocol    Answer:   Yes   CBC with Differential (Cancer Center Only)    Standing Status:   Future    Standing Expiration Date:   10/17/2023   CMP (Cancer Center only)    Standing Status:   Future    Standing Expiration Date:   10/17/2023   Total Protein, Urine dipstick    Standing Status:   Future    Standing Expiration Date:   10/17/2023   All questions were answered. The patient knows to call the clinic with any problems, questions or concerns. No barriers to learning was detected. The total time spent in the appointment was 25 minutes.     [name], MD 09/05/2022   Carolin Coy, CMA, am acting as scribe for [name], MD.   I have reviewed the above documentation for accuracy and completeness, and I agree with the above.

## 2022-09-07 ENCOUNTER — Inpatient Hospital Stay: Payer: Medicare Other

## 2022-09-07 ENCOUNTER — Encounter: Payer: Self-pay | Admitting: Hematology

## 2022-09-07 VITALS — BP 146/59 | HR 64 | Temp 98.8°F | Resp 18

## 2022-09-07 DIAGNOSIS — C786 Secondary malignant neoplasm of retroperitoneum and peritoneum: Secondary | ICD-10-CM | POA: Diagnosis not present

## 2022-09-07 DIAGNOSIS — Z800 Family history of malignant neoplasm of digestive organs: Secondary | ICD-10-CM | POA: Diagnosis not present

## 2022-09-07 DIAGNOSIS — C182 Malignant neoplasm of ascending colon: Secondary | ICD-10-CM

## 2022-09-07 DIAGNOSIS — Z803 Family history of malignant neoplasm of breast: Secondary | ICD-10-CM | POA: Diagnosis not present

## 2022-09-07 DIAGNOSIS — L309 Dermatitis, unspecified: Secondary | ICD-10-CM | POA: Diagnosis not present

## 2022-09-07 DIAGNOSIS — Z66 Do not resuscitate: Secondary | ICD-10-CM | POA: Diagnosis not present

## 2022-09-07 DIAGNOSIS — Z853 Personal history of malignant neoplasm of breast: Secondary | ICD-10-CM | POA: Diagnosis not present

## 2022-09-07 DIAGNOSIS — C787 Secondary malignant neoplasm of liver and intrahepatic bile duct: Secondary | ICD-10-CM | POA: Diagnosis not present

## 2022-09-07 DIAGNOSIS — Z5111 Encounter for antineoplastic chemotherapy: Secondary | ICD-10-CM | POA: Diagnosis not present

## 2022-09-07 DIAGNOSIS — Z86711 Personal history of pulmonary embolism: Secondary | ICD-10-CM | POA: Diagnosis not present

## 2022-09-07 DIAGNOSIS — G620 Drug-induced polyneuropathy: Secondary | ICD-10-CM | POA: Diagnosis not present

## 2022-09-07 DIAGNOSIS — E1142 Type 2 diabetes mellitus with diabetic polyneuropathy: Secondary | ICD-10-CM | POA: Diagnosis not present

## 2022-09-07 DIAGNOSIS — Z90710 Acquired absence of both cervix and uterus: Secondary | ICD-10-CM | POA: Diagnosis not present

## 2022-09-07 DIAGNOSIS — I10 Essential (primary) hypertension: Secondary | ICD-10-CM | POA: Diagnosis not present

## 2022-09-07 DIAGNOSIS — Z5189 Encounter for other specified aftercare: Secondary | ICD-10-CM | POA: Diagnosis not present

## 2022-09-07 DIAGNOSIS — G8929 Other chronic pain: Secondary | ICD-10-CM | POA: Diagnosis not present

## 2022-09-07 DIAGNOSIS — R197 Diarrhea, unspecified: Secondary | ICD-10-CM | POA: Diagnosis not present

## 2022-09-07 DIAGNOSIS — Z8049 Family history of malignant neoplasm of other genital organs: Secondary | ICD-10-CM | POA: Diagnosis not present

## 2022-09-07 MED ORDER — SODIUM CHLORIDE 0.9% FLUSH
10.0000 mL | INTRAVENOUS | Status: DC | PRN
  Administered 2022-09-07: 10 mL

## 2022-09-07 MED ORDER — HEPARIN SOD (PORK) LOCK FLUSH 100 UNIT/ML IV SOLN
500.0000 [IU] | Freq: Once | INTRAVENOUS | Status: AC | PRN
  Administered 2022-09-07: 500 [IU]

## 2022-09-07 MED ORDER — PEGFILGRASTIM-CBQV 6 MG/0.6ML ~~LOC~~ SOSY
6.0000 mg | PREFILLED_SYRINGE | Freq: Once | SUBCUTANEOUS | Status: AC
  Administered 2022-09-07: 6 mg via SUBCUTANEOUS
  Filled 2022-09-07: qty 0.6

## 2022-09-19 MED FILL — Dexamethasone Sodium Phosphate Inj 100 MG/10ML: INTRAMUSCULAR | Qty: 1 | Status: AC

## 2022-09-19 NOTE — Progress Notes (Signed)
Patient Care Team: [name], MD as PCP - General (Family Medicine) [name], MD as Consulting Physician (General Surgery) [name], MD as Consulting Physician (Hematology) [name], MD as Consulting Physician (General Surgery) [name], MD as Consulting Physician (Hematology and Oncology)   CHIEF COMPLAINT: Follow up metastatic colon cancer   Oncology History Overview Note   Cancer Staging  Malignant neoplasm of female breast [facility]) Staging form: Breast, AJCC 7th Edition - Clinical stage from 06/11/2014: Stage 0 (Tis (DCIS), N0, M0) - Unsigned Staged by: Pathologist and managing physician Laterality: Left Estrogen receptor status: Positive Progesterone receptor status: Positive Stage used in treatment planning: Yes National guidelines used in treatment planning: Yes Type of national guideline used in treatment planning: NCCN - Pathologic stage from 07/03/2014: Stage Unknown (Tis (DCIS), NX, cM0) - Signed by [name], MD on 07/10/2014 Staged by: Pathologist Laterality: Left Estrogen receptor status: Positive Progesterone receptor status: Positive Stage used in treatment planning: Yes National guidelines used in treatment planning: Yes Type of national guideline used in treatment planning: NCCN Staging comments: Staged on final lumpectomy specimen by Dr. [name].  Right colon cancer Staging form: Colon and Rectum, AJCC 8th Edition - Pathologic stage from 11/01/2019: Stage IIIB (pT3, pN2a, cM0) - Signed by [name], MD on 11/29/2019 Stage prefix: Initial diagnosis Histologic grading system: 4 grade system Histologic grade (G): G2 Residual tumor (R): R0 - None Tumor deposits (TD): Absent Perineural invasion (PNI): Absent Microsatellite instability (MSI): Stable KRAS mutation: Unknown NRAS mutation: Unknown BRAF mutation: Unknown     Malignant neoplasm of female breast (HCC)  05/29/2014 Initial Biopsy   Left breast needle core biopsy: Grade 2,  DCIS with calcs. ER+ (100%), PR+ (96%).    06/04/2014 Initial Diagnosis   Left breast DCIS with calcifications, ER 100%, PR 96%   06/10/2014 Breast MRI   Left breast: 2.4 x 1.3 x 1.1 cm area of patchy non-mass enhancement upper outer quadrant includes postbiopsy seroma; Right breast: 1.2 cm previously biopsied stable benign fibroadenoma   06/12/2014 Procedure   Genetic counseling/testing: Identified 1 VUS on CHEK2 gene. Remainder of 17 gene panel tested negative and included: ATM, BARD1, BRCA 1/2, BRIP1, CDH1, CHEK2, EPCAM, MLH1, MSH2, MSH6, NBN, NF1, PALB2, PTEN, RAD50, RAD51C, RAD51D, STK11, and TP53.    07/01/2014 Surgery   Left breast lumpectomy ([facility]): Grade 1, DCIS, spanning 2.3 cm, 1 mm margin, ER 100%, PR 96%   07/31/2014 - 08/28/2014 Radiation Therapy   Adjuvant RT completed [name]). Left breast: Total dose 42.5 Gy over 17 fractions. Left breast boost: Total dose 7.5 Gy over 3 fractions.    09/14/2014 - 01/13/2020 Anti-estrogen oral therapy   Anastrazole 1mg  daily. Planned duration of treatment: 5 years [place]). Completed in 01/2020.    09/25/2014 Survivorship   Survivorship Care Plan given to patient and reviewed with her in person.    03/02/2021 Imaging   CT CAP  IMPRESSION: 1. No findings of active/recurrent malignancy. Partial right hemicolectomy. 2. Endometrial stripe remains mildly thickened, but endometrial biopsy in May was negative for malignancy. 3. Progressive endplate sclerosis and endplate irregularity at T2-3, probably due to degenerative endplate findings. If the has referable upper thoracic pain/symptoms then thoracic spine MRI could be used for further workup. 4. Other imaging findings of potential clinical significance: Mild cardiomegaly. Aortic Atherosclerosis (ICD10-I70.0). Mild mitral valve calcification. Postoperative findings in the left breast with adjacent radiation port anteriorly in the left upper lobe. Tiny pulmonary nodules in the left lower lobe  are unchanged from earliest available comparison of 10/17/2019 and probably benign although may merit surveillance. Multilevel lumbar impingement. Mild pelvic floor laxity.   Malignant neoplasm of ascending colon (HCC)  09/19/2019 Imaging   CT AP W contrast 09/19/19  IMPRESSION Fullness in the cecum, cannot exclude a mass. No evidence for metastatic disease is identified.    09/24/2019 Procedure   Colonoscopy by Dr [name] 09/24/19 IMPRESSION 1. The colon was redundant  2. Mild diverticulosis was noted through the entire examined colon 3. Single 12mm polyp was found in the ascending colon; polypectomy was performed using snare cautery and biopsy forceps 4. Mild diverticulosis was notes in the descending colon and sigmoid colon.  5. Single polyp was found in the sigmoid colon, polypectomy was performed with cold forceps.  6. Single polyp was found in the rectosigmoid colon; polypectomy was performed with cold snare  7. Small internal hemorrhoids  8. Large mass was found at the cecum; multiple biopsies of the area were performed using cold forceps; injection (tattooing) was performed distal to the mass.    09/24/2019 Initial Biopsy   INTERPRETATION AND DIAGNOSIS:  A. Cecum, biopsy:  Invasive moderately differentiated adenocarcinoma.  see comment  B. Polyp @ ascending colon, polypectomy:  Tubular Adenoma  C. Polyp @ sigmoid colon Polypectomy:  hyperplastic polyp.  D. Polyp @ rectosigmoid colon, Polypectomy:  Hyperplastic Polyp      10/16/2019 Imaging   CT Chest IMPRESSION: 1. Multiple small pulmonary nodules measuring 5 mm or less in size in the lungs. These are nonspecific and are typically considered statistically likely benign. However, given the patient's history of primary malignancy, close attention on follow-up studies is recommended to ensure stability. 2. Aortic atherosclerosis, in addition to right coronary artery disease. Assessment for potential risk factor modification,  dietary therapy or pharmacologic therapy may be warranted, if clinically indicated. 3. There are calcifications of the aortic valve and mitral annulus. Echocardiographic correlation for evaluation of potential valvular dysfunction may be warranted if clinically indicated. 4. Small hiatal hernia.   Aortic Atherosclerosis (ICD10-I70.0).   11/01/2019 Initial Diagnosis   Colon cancer (HCC)   11/01/2019 Surgery   LAPAROSCOPIC PARTIAL COLECTOMY by Dr Maisie Fus and Dr Michaell Cowing   11/01/2019 Pathology Results   FINAL MICROSCOPIC DIAGNOSIS:   A. COLON, PROXIMAL RIGHT, COLECTOMY:  - Invasive colonic adenocarcinoma, 5 cm.  - Tumor invades through the muscularis propria into pericolonic tissues.   - Margins of resection are not involved.  - Metastatic carcinoma in (5) of (13) lymph nodes.  - See oncology table.    MSI Stable  Mismatch repair normal  MLH1 - Preserved nuclear expression (greater 50% tumor expression) MSH2 - Preserved nuclear expression (greater 50% tumor expression) MSH6 - Preserved nuclear expression (greater 50% tumor expression) PMS2 - Preserved nuclear expression (greater 50% tumor expression)   11/01/2019 Cancer Staging   Staging form: Colon and Rectum, AJCC 8th Edition - Pathologic stage from 11/01/2019: Stage IIIB (pT3, pN2a, cM0) - Signed by [name], MD on 11/29/2019   12/10/2019 Procedure   PAC placed 12/10/19   12/17/2019 - 06/01/2020 Chemotherapy   FOLFOX q2weeks starting in 2 weeks starting 12/17/19. Held 01/27/20-02/10/20 due to b/l PE. Oxaliplatin held C11-12 due to neuropathy. Completed on 06/01/20   03/02/2021 Imaging   CT CAP  IMPRESSION: 1. No findings of active/recurrent malignancy. Partial right hemicolectomy. 2. Endometrial stripe remains mildly thickened, but endometrial biopsy in May was negative for malignancy. 3. Progressive endplate sclerosis and endplate irregularity at T2-3, probably due to  degenerative endplate findings. If the has referable upper  thoracic pain/symptoms then thoracic spine MRI could be used for further workup. 4. Other imaging findings of potential clinical significance: Mild cardiomegaly. Aortic Atherosclerosis (ICD10-I70.0). Mild mitral valve calcification. Postoperative findings in the left breast with adjacent radiation port anteriorly in the left upper lobe. Tiny pulmonary nodules in the left lower lobe are unchanged from earliest available comparison of 10/17/2019 and probably benign although may merit surveillance. Multilevel lumbar impingement. Mild pelvic floor laxity.   08/26/2021 Imaging   EXAM: CT CHEST, ABDOMEN, AND PELVIS WITH CONTRAST  IMPRESSION: 1. Stable examination without new or progressive findings to suggest local recurrence or metastatic disease within the chest, abdomen, or pelvis. 2. Hepatomegaly with hepatic steatosis. 3. Sigmoid colonic diverticulosis without findings of acute diverticulitis. 4. Similar prominent endplate sclerosis and irregularity at T2-T3 is most consistent with Modic type endplate degenerative changes. However, if patient has referable upper thoracic pain consider further workup with thoracic spine MRI. 5. Similar thickening of the endometrial stripe measuring up to 8 mm, which was previously biopsied with results negative for malignancy. 6. Aortic Atherosclerosis (ICD10-I70.0).   11/10/2021 PET scan   IMPRESSION: 1. LEFT ovary is increased in size and is intensely hypermetabolic. While physiologic hypermetabolic ovarian tissue is not uncommon, the enlargement and asymmetric activity warrants further evaluation. Consider contrast pelvic MRI vs tissue sampling. 2. No evidence of metastatic colorectal carcinoma otherwise. 3. Post RIGHT hemicolectomy anatomy. 4. Evidence of radiation change in the LEFT upper lobe (remote breast cancer).     12/22/2021 Relapse/Recurrence    FINAL MICROSCOPIC DIAGNOSIS:   A. LEFT OVARY AND FALLOPIAN TUBE, SALPINGO OOPHORECTOMY:  -  Metastatic moderately differentiated colonic adenocarcinoma involving left ovary  - Focal ovarian stromal calcification  - Segment of benign fallopian tube   B. UTERUS WITH RIGHT FALLOPIAN TUBE AND OVARY, HYSTERECTOMY AND SALPINGO-OOPHORECTOMY:  - Metastatic moderately differentiated colonic adenocarcinoma involving right ovary  - Focal invasive extensive adenomyosis  - Benign endometrial polyps  - Benign proliferative phase endometrium  - Hydrosalpinx of right fallopian tube  - Focal ovarian stromal calcification   C. PERITONEAL DEPOSITS, ANTERIOR CUL DE SAC, BIOPSY:  - Metastatic mucinous adenocarcinoma, consistent with colorectal primary    COMMENT:  Immunohistochemical stains show that the tumor cells are positive for CK20 and CDX2 while they are negative for CK7 and PAX8, consistent with above interpretation.    01/24/2022 - 03/23/2022 Chemotherapy   Patient is on Treatment Plan : COLORECTAL FOLFOX q14d x 3 months     04/01/2022 Imaging   CT AP IMPRESSION: 1. New omental metastatic disease. 2. Vague hypoattenuating lesion in the inferior right hepatic lobe is new and also worrisome for metastatic disease. 3. Similar small right lower lobe nodules. Recommend continued attention on follow-up. 4. Steatotic enlarged liver. 5.  Aortic atherosclerosis (ICD10-I70.0).   04/06/2022 -  Chemotherapy   Patient is on Treatment Plan : COLORECTAL FOLFIRI + Bevacizumab q14d     04/21/2022 Imaging    IMPRESSION: 1. Stable chest CT. No evidence of metastatic disease. 2. Stable small pulmonary nodules, considered benign based on stability. 3. Stable postsurgical changes in the left breast and anterior left upper lobe radiation changes. 4. Aortic Atherosclerosis (ICD10-I70.0) and Emphysema (ICD10-J43.9).   04/21/2022 Imaging    IMPRESSION: 1. Small enhancing lesion inferiorly in the right hepatic lobe is typical of metastatic disease and stable from recent abdominal CT. No other  definite liver lesions are identified. Mild hepatic steatosis. 2. No other significant abdominal  findings. 3. Omental nodularity seen on CT is not well visualized by MRI.    Imaging     06/23/2022 Imaging    IMPRESSION: 1. Hypodense lesion of the inferior right lobe of the liver, hepatic segment VI is diminished in size, consistent with treatment response. 2. Tiny peritoneal and omental nodules identified by prior examination are diminished in size, consistent with treatment response. 3. Multiple tiny pulmonary nodules unchanged, most likely benign and incidental, however continued attention on follow-up warranted in the setting of known metastatic disease. 4. No evidence of new metastatic disease in the chest, abdomen, or pelvis. 5. Status post partial right hemicolectomy and reanastomosis. 6. Diffuse mosaic attenuation of the airspaces, consistent with small airways disease. 7. Hepatomegaly.      CURRENT THERAPY: FOLFIRI and Beva q14 days, since 04/06/2022  INTERVAL HISTORY Ms. Pennycuff returns for follow up as scheduled. Last seen by Dr. Mosetta Putt 09/05/22 and completed cycle 12. She has restaging scan 5/28  ROS   Past Medical History:  Diagnosis Date  . Aortic atherosclerosis (HCC)   . Arthritis    feet, lower back  . Basal cell carcinoma    arm  . Breast cancer of upper-outer quadrant of left female breast (HCC) 06/04/2014  . Cataract    immature on the left  . Colon cancer (HCC) 08/2019  . Diabetes mellitus without complication (HCC)   . Diverticulosis   . Dizziness    > 19yrs ago;took Antivert   . Family history of anesthesia complication    sister slow to wake up with anesthesia  . Family history of breast cancer   . Family history of colon cancer   . Family history of uterine cancer   . GERD (gastroesophageal reflux disease)    takes occasional TUMs  . History of bronchitis    > 71yrs ago  . History of colon polyps   . History of hiatal hernia    Small noted on  CT  . History of pulmonary embolus (PE)   . Hypertension    takes Losartan daily and HCTZ  . Iron deficiency anemia   . Joint pain   . Numbness    to toes on each foot  . Peripheral neuropathy    feet and toes  . Personal history of radiation therapy   . Pulmonary nodules    Noted on CT  . Radiation 07/31/14-08/28/14   Left Breast 20 fxs  . Seasonal allergies    takes Claritin prn  . Urinary frequency   . Vitamin D deficiency    takes VIt D daily     Past Surgical History:  Procedure Laterality Date  . BREAST BIOPSY Bilateral   . BREAST LUMPECTOMY Left   . BREAST LUMPECTOMY WITH RADIOACTIVE SEED LOCALIZATION Left 07/01/2014   Procedure: LEFT BREAST LUMPECTOMY WITH RADIOACTIVE SEED LOCALIZATION;  Surgeon: Glenna Fellows, MD;  Location: [facility];  Service: General;  Laterality: Left;  . CATARACT EXTRACTION Right   . COLONOSCOPY  09/24/2019   [place]  . LAPAROSCOPIC PARTIAL COLECTOMY N/A 11/01/2019   Procedure: LAPAROSCOPIC PARTIAL COLECTOMY;  Surgeon: [name], MD;  Location: [facility];  Service: General;  Laterality: N/A;  . POLYPECTOMY    . PORTACATH PLACEMENT N/A 12/10/2019   Procedure: INSERTION PORT-A-CATH ULTRASOUND GUIDED IN RIGHT IJ;  Surgeon: [name], MD;  Location: [facility];  Service: General;  Laterality: N/A;  . ROBOTIC ASSISTED TOTAL HYSTERECTOMY Bilateral 12/22/2021   Procedure: XI ROBOTIC ASSISTED TOTAL HYSTERECTOMY WITH BILATERAL SALPINGO  OOPHORECTOMY;  Surgeon: Carver Fila, MD;  Location: [facility];  Service: Gynecology;  Laterality: Bilateral;  . TOTAL KNEE ARTHROPLASTY Left 10/24/2012   Procedure: TOTAL KNEE ARTHROPLASTY;  Surgeon: Nestor Lewandowsky, MD;  Location: [facility];  Service: Orthopedics;  Laterality: Left;  . TOTAL KNEE ARTHROPLASTY Right 01/09/2013   Procedure: TOTAL KNEE ARTHROPLASTY;  Surgeon: Nestor Lewandowsky, MD;  Location: [facility];  Service: Orthopedics;  Laterality: Right;  . TUBAL LIGATION       Outpatient Encounter  Medications as of 09/20/2022  Medication Sig  . acetaminophen (TYLENOL) 325 MG tablet Take 650 mg by mouth every 6 (six) hours as needed for moderate pain.  Marland Kitchen b complex vitamins capsule Take 1 capsule by mouth daily.  . Cholecalciferol (VITAMIN D) 2000 UNITS CAPS Take 2,000 Units by mouth daily.   . Ibuprofen 200 MG CAPS Take by mouth.  . loratadine (CLARITIN) 10 MG tablet Take 10 mg by mouth daily as needed for allergies.  Marland Kitchen losartan (COZAAR) 25 MG tablet Take 1 tablet (25 mg total) by mouth daily.  . metFORMIN (GLUCOPHAGE) 500 MG tablet Take 1 tablet (500 mg total) by mouth in the morning and at bedtime.  . Multiple Vitamins-Minerals (MULTIVITAMIN WITH MINERALS) tablet Take 1 tablet by mouth daily.  Marland Kitchen omeprazole (PRILOSEC) 20 MG capsule Take 20 mg by mouth daily before breakfast.   . ondansetron (ZOFRAN) 8 MG tablet Take 1 tablet (8 mg total) by mouth every 8 (eight) hours as needed for nausea or vomiting. Start on the third day after chemotherapy.  . potassium chloride (KLOR-CON) 10 MEQ tablet Take 1 tablet (10 mEq total) by mouth daily.  . pravastatin (PRAVACHOL) 10 MG tablet Take 1 tablet (10 mg total) by mouth daily.  . pregabalin (LYRICA) 25 MG capsule Take 1 capsule (25 mg total) by mouth 2 (two) times daily.  . prochlorperazine (COMPAZINE) 10 MG tablet Take 1 tablet (10 mg total) by mouth every 6 (six) hours as needed for nausea or vomiting.   No facility-administered encounter medications on file as of 09/20/2022.     There were no vitals filed for this visit. There is no height or weight on file to calculate BMI.   PHYSICAL EXAM GENERAL:alert, no distress and comfortable SKIN: no rash  EYES: sclera clear NECK: without mass LYMPH:  no palpable cervical or supraclavicular lymphadenopathy  LUNGS: clear with normal breathing effort HEART: regular rate & rhythm, no lower extremity edema ABDOMEN: abdomen soft, non-tender and normal bowel sounds NEURO: alert & oriented x 3 with  fluent speech, no focal motor/sensory deficits Breast exam:  PAC without erythema    CBC    Component Value Date/Time   WBC 16.1 (H) 09/05/2022 0809   WBC 5.2 02/04/2022 1256   RBC 3.49 (L) 09/05/2022 0809   HGB 11.2 (L) 09/05/2022 0809   HGB 13.0 10/03/2014 0914   HCT 35.9 (L) 09/05/2022 0809   HCT 38.1 10/03/2014 0914   PLT 264 09/05/2022 0809   PLT 310 10/03/2014 0914   MCV 102.9 (H) 09/05/2022 0809   MCV 93.9 10/03/2014 0914   MCH 32.1 09/05/2022 0809   MCHC 31.2 09/05/2022 0809   RDW 16.5 (H) 09/05/2022 0809   RDW 13.8 10/03/2014 0914   LYMPHSABS 3.8 09/05/2022 0809   LYMPHSABS 2.0 10/03/2014 0914   MONOABS 1.0 09/05/2022 0809   MONOABS 0.6 10/03/2014 0914   EOSABS 0.3 09/05/2022 0809   EOSABS 0.2 10/03/2014 0914   BASOSABS 0.1 09/05/2022 0809  BASOSABS 0.1 10/03/2014 0914     CMP     Component Value Date/Time   NA 141 09/05/2022 0809   NA 144 10/03/2014 0914   K 4.0 09/05/2022 0809   K 4.6 10/03/2014 0914   CL 107 09/05/2022 0809   CO2 27 09/05/2022 0809   CO2 29 10/03/2014 0914   GLUCOSE 126 (H) 09/05/2022 0809   GLUCOSE 133 10/03/2014 0914   BUN 20 09/05/2022 0809   BUN 17.8 10/03/2014 0914   CREATININE 0.85 09/05/2022 0809   CREATININE 0.8 10/03/2014 0914   CALCIUM 9.3 09/05/2022 0809   CALCIUM 9.7 10/03/2014 0914   PROT 6.5 09/05/2022 0809   PROT 7.0 10/03/2014 0914   ALBUMIN 4.0 09/05/2022 0809   ALBUMIN 3.7 10/03/2014 0914   AST 24 09/05/2022 0809   AST 18 10/03/2014 0914   ALT 16 09/05/2022 0809   ALT 13 10/03/2014 0914   ALKPHOS 133 (H) 09/05/2022 0809   ALKPHOS 74 10/03/2014 0914   BILITOT 0.6 09/05/2022 0809   BILITOT 0.98 10/03/2014 0914   GFRNONAA >60 09/05/2022 0809   GFRAA >60 01/30/2020 0429   GFRAA >60 01/27/2020 0834     ASSESSMENT & PLAN: Jozey Schooler is a 77 y.o. female with    1. Right colon cancer, pT3N2aM0 stage IIB, MSS -Diagnosed in 08/2019 , staging CT chest showed multiple small lung nodules that were  nonspecific but likely benign.  -S/p surgery with Dr Maisie Fus on 11/01/19. Path showed overall Stage IIIB cancer.  -She received 6 months of FOLFOX to reduce her risk of recurrence, completed 05/2020. She is currently on surveillance.  -surveillance colonoscopy on 02/10/21 under Dr. Leone Payor showed only diverticulosis. Repeat due in 01/2024. -restaging CT CAP 08/26/21 was NED. No definitive evidence of cancer recurrence.  The endplate sclerosis and irregularity at T2-3 is similar, she endorses neck pain . -Due to rising CEA she underwent a PET scan 11/10/2021 which showed hypermetabolism and enlargement to the left ovary, no other evidence of metastatic disease  -S/p total hysterectomy 12/22/2021 by Dr. Pricilla Holm, path revealed metastatic moderately differentiated colonic adenocarcinoma involving both ovaries with peritoneal deposits also positive for metastatic mucinous adenocarcinoma consistent with colorectal primary -[facility] showed K-ras mutation, unfortunately she is not eligible for any targeted therapy -She restarted FOLFOX 01/24/2022, dose reduced for neuropathy.  She completed 5 cycles, tolerated very well with mild fatigue and cold sensitivity.  Side effects are adequately managed with supportive care at home.  She is able to recover and function well.  Clinically she is doing well but CEA continues to rise -We reviewed her restaging CT AP which unfortunately shows new omental metastasis and a vague 14 mm lesion in the right hepatic lobe which is difficult to see. This is indeterminate and will be followed. Pt seen with Dr. Mosetta Putt who recommends to stop FOLFOX and move to second line FOLFIRI and bevacizumab - -Chemotherapy consent: Side effects of FOLFIRI/bevacizumab including but not limited to fatigue, nausea, vomiting, diarrhea, hair loss, neuropathy, fluid retention, renal and liver dysfunction, neutropenic fever, need for blood transfusion, bleeding, and thrombosis were discussed with patient in  great detail. She agrees to proceed.  The goal remains palliative -Dr. Mosetta Putt also reviewed the potential for HIPEC if she responds well to chemo and is a candidate.  -Labs reviewed. ANC 1.1; the plan is to dose-reduce irinotecan and add [place] pending insurance approval. Chemo moved to 12/7 to give time for the approval process.  -Pt agrees with the plan, she  did not want to postpone chemo to next week -F/up in 2 weeks with cycle 2   2. Neuropathy, secondary to Oxaliplatin G2 -S/p C7 she started having numbness in hands, L>R which become prolonged with tingling s/p C10.  -Oxaliplatin dose reduced and ultimately held with C11-12.  -improved, now minor tingling in fingertips, numbness in toes; no pain -continue Lyrica and B vit complex   3. H/o left breast DCIS, G2, ER/PR+ -Dx in 05/2014. S/p left lumpectomy with Dr Johna Sheriff, adjuvant RT with Dr [name], and adjuvant Anastrozole 08/2014-01/13/20 under the care of Dr Pamelia Hoit.  -DEXA 09/11/20 T score +1.2 normal.  -Mammogram 10/04/2021 was negative -No hypermetabolism in the breast on recent PET to suggest recurrence    4. Submassive bilateral pulmonary embolism, LE Edema  -S/p C3 chemo, her 01/27/20 CTA showed PE with right heart strain. She also has bilateral lower extremity edema and Doppler ultrasound was negative for DVT bilaterally.  -She was treated with 6 months of Eliquis, Lasix and oral potassium BID for LE Edema. -She has mild recurrent lower extremity edema, left lower extremity Doppler 11/20 was negative -She is using leg massaging boots which helps -Pt driving back and forth to [place] to see her sick sister, we reviewed thrombosis risk with prolonged travel.    5. Comorbidities: Arthritis, DM, HTN, GERD, neck and back pain with sciatica -she switched to Dr. Veto Kemps for primary care in 05/2021. -Chronic back pain is stable, no new pain       PLAN:  No orders of the defined types were placed in this encounter.     All questions were  answered. The patient knows to call the clinic with any problems, questions or concerns. No barriers to learning were detected. I spent *** counseling the patient face to face. The total time spent in the appointment was *** and more than 50% was on counseling, review of test results, and coordination of care.   Santiago Glad, NP-C @DATE @

## 2022-09-20 ENCOUNTER — Inpatient Hospital Stay: Payer: Medicare Other

## 2022-09-20 ENCOUNTER — Inpatient Hospital Stay: Payer: Medicare Other | Admitting: Nurse Practitioner

## 2022-09-20 ENCOUNTER — Encounter: Payer: Self-pay | Admitting: Nurse Practitioner

## 2022-09-20 VITALS — BP 138/61 | HR 76 | Temp 99.1°F | Resp 18 | Ht 65.0 in | Wt 214.4 lb

## 2022-09-20 DIAGNOSIS — Z66 Do not resuscitate: Secondary | ICD-10-CM | POA: Diagnosis not present

## 2022-09-20 DIAGNOSIS — Z803 Family history of malignant neoplasm of breast: Secondary | ICD-10-CM | POA: Diagnosis not present

## 2022-09-20 DIAGNOSIS — I10 Essential (primary) hypertension: Secondary | ICD-10-CM | POA: Diagnosis not present

## 2022-09-20 DIAGNOSIS — C182 Malignant neoplasm of ascending colon: Secondary | ICD-10-CM

## 2022-09-20 DIAGNOSIS — C787 Secondary malignant neoplasm of liver and intrahepatic bile duct: Secondary | ICD-10-CM | POA: Diagnosis not present

## 2022-09-20 DIAGNOSIS — Z8049 Family history of malignant neoplasm of other genital organs: Secondary | ICD-10-CM | POA: Diagnosis not present

## 2022-09-20 DIAGNOSIS — Z5111 Encounter for antineoplastic chemotherapy: Secondary | ICD-10-CM | POA: Diagnosis not present

## 2022-09-20 DIAGNOSIS — G8929 Other chronic pain: Secondary | ICD-10-CM | POA: Diagnosis not present

## 2022-09-20 DIAGNOSIS — R197 Diarrhea, unspecified: Secondary | ICD-10-CM | POA: Diagnosis not present

## 2022-09-20 DIAGNOSIS — Z90710 Acquired absence of both cervix and uterus: Secondary | ICD-10-CM | POA: Diagnosis not present

## 2022-09-20 DIAGNOSIS — R200 Anesthesia of skin: Secondary | ICD-10-CM

## 2022-09-20 DIAGNOSIS — Z800 Family history of malignant neoplasm of digestive organs: Secondary | ICD-10-CM | POA: Diagnosis not present

## 2022-09-20 DIAGNOSIS — E1142 Type 2 diabetes mellitus with diabetic polyneuropathy: Secondary | ICD-10-CM | POA: Diagnosis not present

## 2022-09-20 DIAGNOSIS — R202 Paresthesia of skin: Secondary | ICD-10-CM

## 2022-09-20 DIAGNOSIS — G620 Drug-induced polyneuropathy: Secondary | ICD-10-CM | POA: Diagnosis not present

## 2022-09-20 DIAGNOSIS — L309 Dermatitis, unspecified: Secondary | ICD-10-CM | POA: Diagnosis not present

## 2022-09-20 DIAGNOSIS — Z95828 Presence of other vascular implants and grafts: Secondary | ICD-10-CM

## 2022-09-20 DIAGNOSIS — Z5189 Encounter for other specified aftercare: Secondary | ICD-10-CM | POA: Diagnosis not present

## 2022-09-20 DIAGNOSIS — C786 Secondary malignant neoplasm of retroperitoneum and peritoneum: Secondary | ICD-10-CM | POA: Diagnosis not present

## 2022-09-20 DIAGNOSIS — Z86711 Personal history of pulmonary embolism: Secondary | ICD-10-CM | POA: Diagnosis not present

## 2022-09-20 DIAGNOSIS — Z853 Personal history of malignant neoplasm of breast: Secondary | ICD-10-CM | POA: Diagnosis not present

## 2022-09-20 LAB — CBC WITH DIFFERENTIAL (CANCER CENTER ONLY)
Abs Immature Granulocytes: 1.48 10*3/uL — ABNORMAL HIGH (ref 0.00–0.07)
Basophils Absolute: 0.2 10*3/uL — ABNORMAL HIGH (ref 0.0–0.1)
Basophils Relative: 1 %
Eosinophils Absolute: 0.4 10*3/uL (ref 0.0–0.5)
Eosinophils Relative: 2 %
HCT: 34.9 % — ABNORMAL LOW (ref 36.0–46.0)
Hemoglobin: 11.2 g/dL — ABNORMAL LOW (ref 12.0–15.0)
Immature Granulocytes: 8 %
Lymphocytes Relative: 21 %
Lymphs Abs: 3.9 10*3/uL (ref 0.7–4.0)
MCH: 32.4 pg (ref 26.0–34.0)
MCHC: 32.1 g/dL (ref 30.0–36.0)
MCV: 100.9 fL — ABNORMAL HIGH (ref 80.0–100.0)
Monocytes Absolute: 1.1 10*3/uL — ABNORMAL HIGH (ref 0.1–1.0)
Monocytes Relative: 6 %
Neutro Abs: 11.2 10*3/uL — ABNORMAL HIGH (ref 1.7–7.7)
Neutrophils Relative %: 62 %
Platelet Count: 201 10*3/uL (ref 150–400)
RBC: 3.46 MIL/uL — ABNORMAL LOW (ref 3.87–5.11)
RDW: 17 % — ABNORMAL HIGH (ref 11.5–15.5)
WBC Count: 18.9 10*3/uL — ABNORMAL HIGH (ref 4.0–10.5)
nRBC: 0.2 % (ref 0.0–0.2)

## 2022-09-20 LAB — CMP (CANCER CENTER ONLY)
ALT: 30 U/L (ref 0–44)
AST: 38 U/L (ref 15–41)
Albumin: 4 g/dL (ref 3.5–5.0)
Alkaline Phosphatase: 173 U/L — ABNORMAL HIGH (ref 38–126)
Anion gap: 8 (ref 5–15)
BUN: 19 mg/dL (ref 8–23)
CO2: 25 mmol/L (ref 22–32)
Calcium: 8.9 mg/dL (ref 8.9–10.3)
Chloride: 110 mmol/L (ref 98–111)
Creatinine: 0.78 mg/dL (ref 0.44–1.00)
GFR, Estimated: >60 mL/min (ref >60)
Glucose, Bld: 133 mg/dL — ABNORMAL HIGH (ref 70–99)
Potassium: 4 mmol/L (ref 3.5–5.1)
Sodium: 143 mmol/L (ref 135–145)
Total Bilirubin: 0.7 mg/dL (ref 0.3–1.2)
Total Protein: 6.5 g/dL (ref 6.5–8.1)

## 2022-09-20 LAB — TOTAL PROTEIN, URINE DIPSTICK: Protein, ur: NEGATIVE mg/dL

## 2022-09-20 MED ORDER — SODIUM CHLORIDE 0.9 % IV SOLN: Freq: Once | INTRAVENOUS | Status: AC

## 2022-09-20 MED ORDER — PREGABALIN 25 MG PO CAPS
25.0000 mg | ORAL_CAPSULE | Freq: Two times a day (BID) | ORAL | 3 refills | Status: DC
Start: 2022-09-20 — End: 2022-10-24

## 2022-09-20 MED ORDER — SODIUM CHLORIDE 0.9 % IV SOLN
400.0000 mg/m2 | Freq: Once | INTRAVENOUS | Status: AC
  Administered 2022-09-20: 896 mg via INTRAVENOUS
  Filled 2022-09-20: qty 44.8

## 2022-09-20 MED ORDER — SODIUM CHLORIDE 0.9 % IV SOLN
2400.0000 mg/m2 | INTRAVENOUS | Status: DC
  Administered 2022-09-20: 5000 mg via INTRAVENOUS
  Filled 2022-09-20: qty 100

## 2022-09-20 MED ORDER — PALONOSETRON HCL INJECTION 0.25 MG/5ML
0.2500 mg | Freq: Once | INTRAVENOUS | Status: AC
  Administered 2022-09-20: 0.25 mg via INTRAVENOUS
  Filled 2022-09-20: qty 5

## 2022-09-20 MED ORDER — SODIUM CHLORIDE 0.9% FLUSH
10.0000 mL | INTRAVENOUS | Status: DC | PRN
  Administered 2022-09-20: 10 mL

## 2022-09-20 MED ORDER — SODIUM CHLORIDE 0.9 % IV SOLN
10.0000 mg | Freq: Once | INTRAVENOUS | Status: AC
  Administered 2022-09-20: 10 mg via INTRAVENOUS
  Filled 2022-09-20: qty 10

## 2022-09-20 MED ORDER — TRIAMCINOLONE ACETONIDE 0.5 % EX OINT: 1.0000 | TOPICAL_OINTMENT | Freq: Two times a day (BID) | CUTANEOUS | 0 refills | Status: AC

## 2022-09-20 MED ORDER — SODIUM CHLORIDE 0.9 % IV SOLN
5.0000 mg/kg | Freq: Once | INTRAVENOUS | Status: AC
  Administered 2022-09-20: 500 mg via INTRAVENOUS
  Filled 2022-09-20: qty 4

## 2022-09-20 MED ORDER — SODIUM CHLORIDE 0.9 % IV SOLN
100.0000 mg/m2 | Freq: Once | INTRAVENOUS | Status: AC
  Administered 2022-09-20: 220 mg via INTRAVENOUS
  Filled 2022-09-20: qty 11

## 2022-09-20 MED ORDER — ATROPINE SULFATE 1 MG/ML IV SOLN
0.5000 mg | Freq: Once | INTRAVENOUS | Status: AC | PRN
  Administered 2022-09-20: 0.5 mg via INTRAVENOUS
  Filled 2022-09-20: qty 1

## 2022-09-20 NOTE — Patient Instructions (Addendum)
[facility]  Discharge Instructions: Thank you for choosing [facility] to provide your oncology and hematology care.   If you have a lab appointment with the Cancer Center, please go directly to the Cancer Center and check in at the registration area.   Wear comfortable clothing and clothing appropriate for easy access to any Portacath or PICC line.   We strive to give you quality time with your provider. You may need to reschedule your appointment if you arrive late (15 or more minutes).  Arriving late affects you and other patients whose appointments are after yours.  Also, if you miss three or more appointments without notifying the office, you may be dismissed from the clinic at the provider's discretion.      For prescription refill requests, have your pharmacy contact our office and allow 72 hours for refills to be completed.    Today you received the following chemotherapy and/or immunotherapy agents: MVASI/Irinotecan/Leucovorin/Fluorouracil       To help prevent nausea and vomiting after your treatment, we encourage you to take your nausea medication as directed.  BELOW ARE SYMPTOMS THAT SHOULD BE REPORTED IMMEDIATELY: *FEVER GREATER THAN 100.4 F (38 C) OR HIGHER *CHILLS OR SWEATING *NAUSEA AND VOMITING THAT IS NOT CONTROLLED WITH YOUR NAUSEA MEDICATION *UNUSUAL SHORTNESS OF BREATH *UNUSUAL BRUISING OR BLEEDING *URINARY PROBLEMS (pain or burning when urinating, or frequent urination) *BOWEL PROBLEMS (unusual diarrhea, constipation, pain near the anus) TENDERNESS IN MOUTH AND THROAT WITH OR WITHOUT PRESENCE OF ULCERS (sore throat, sores in mouth, or a toothache) UNUSUAL RASH, SWELLING OR PAIN  UNUSUAL VAGINAL DISCHARGE OR ITCHING   Items with * indicate a potential emergency and should be followed up as soon as possible or go to the Emergency Department if any problems should occur.  Please show the CHEMOTHERAPY ALERT CARD or  IMMUNOTHERAPY ALERT CARD at check-in to the Emergency Department and triage nurse.  Should you have questions after your visit or need to cancel or reschedule your appointment, please contact [facility]  Dept: [phone]  and follow the prompts.  Office hours are 8:00 a.m. to 4:30 p.m. Monday - Friday. Please note that voicemails left after 4:00 p.m. may not be returned until the following business day.  We are closed weekends and major holidays. You have access to a nurse at all times for urgent questions. Please call the main number to the clinic Dept: [phone] and follow the prompts.   For any non-urgent questions, you may also contact your provider using MyChart. We now offer e-Visits for anyone 43 and older to request care online for non-urgent symptoms. For details visit [email or web address].   Also download the MyChart app! Go to the app store, search "MyChart", open the app, select [facility], and log in with your MyChart username and password.  The chemotherapy medication bag should finish at 46 hours, 96 hours, or 7 days. For example, if your pump is scheduled for 46 hours and it was put on at 4:00 p.m., it should finish at 2:00 p.m. the day it is scheduled to come off regardless of your appointment time.     Estimated time to finish at 10:30 AM on 09/22/2022.   If the display on your pump reads "Low Volume" and it is beeping, take the batteries out of the pump and come to the cancer center for it to be taken off.   If the pump alarms go  off prior to the pump reading "Low Volume" then call [phone] and someone can assist you.  If the plunger comes out and the chemotherapy medication is leaking out, please use your home chemo spill kit to clean up the spill. Do NOT use paper towels or other household products.  If you have problems or questions regarding your pump, please call either [phone] (24 hours a day) or the cancer center  Monday-Friday 8:00 a.m.- 4:30 p.m. at the clinic number and we will assist you. If you are unable to get assistance, then go to the nearest Emergency Department and ask the staff to contact the IV team for assistance.

## 2022-09-21 ENCOUNTER — Other Ambulatory Visit: Payer: Self-pay | Admitting: Hematology

## 2022-09-21 DIAGNOSIS — Z1231 Encounter for screening mammogram for malignant neoplasm of breast: Secondary | ICD-10-CM

## 2022-09-22 ENCOUNTER — Inpatient Hospital Stay: Payer: Medicare Other

## 2022-09-22 VITALS — BP 128/57 | HR 60 | Temp 99.0°F | Resp 18

## 2022-09-22 DIAGNOSIS — Z853 Personal history of malignant neoplasm of breast: Secondary | ICD-10-CM | POA: Diagnosis not present

## 2022-09-22 DIAGNOSIS — C786 Secondary malignant neoplasm of retroperitoneum and peritoneum: Secondary | ICD-10-CM | POA: Diagnosis not present

## 2022-09-22 DIAGNOSIS — I10 Essential (primary) hypertension: Secondary | ICD-10-CM | POA: Diagnosis not present

## 2022-09-22 DIAGNOSIS — Z66 Do not resuscitate: Secondary | ICD-10-CM | POA: Diagnosis not present

## 2022-09-22 DIAGNOSIS — Z8049 Family history of malignant neoplasm of other genital organs: Secondary | ICD-10-CM | POA: Diagnosis not present

## 2022-09-22 DIAGNOSIS — R197 Diarrhea, unspecified: Secondary | ICD-10-CM | POA: Diagnosis not present

## 2022-09-22 DIAGNOSIS — Z90710 Acquired absence of both cervix and uterus: Secondary | ICD-10-CM | POA: Diagnosis not present

## 2022-09-22 DIAGNOSIS — Z803 Family history of malignant neoplasm of breast: Secondary | ICD-10-CM | POA: Diagnosis not present

## 2022-09-22 DIAGNOSIS — C787 Secondary malignant neoplasm of liver and intrahepatic bile duct: Secondary | ICD-10-CM | POA: Diagnosis not present

## 2022-09-22 DIAGNOSIS — Z5111 Encounter for antineoplastic chemotherapy: Secondary | ICD-10-CM | POA: Diagnosis not present

## 2022-09-22 DIAGNOSIS — G8929 Other chronic pain: Secondary | ICD-10-CM | POA: Diagnosis not present

## 2022-09-22 DIAGNOSIS — E1142 Type 2 diabetes mellitus with diabetic polyneuropathy: Secondary | ICD-10-CM | POA: Diagnosis not present

## 2022-09-22 DIAGNOSIS — C182 Malignant neoplasm of ascending colon: Secondary | ICD-10-CM

## 2022-09-22 DIAGNOSIS — L309 Dermatitis, unspecified: Secondary | ICD-10-CM | POA: Diagnosis not present

## 2022-09-22 DIAGNOSIS — Z86711 Personal history of pulmonary embolism: Secondary | ICD-10-CM | POA: Diagnosis not present

## 2022-09-22 DIAGNOSIS — Z800 Family history of malignant neoplasm of digestive organs: Secondary | ICD-10-CM | POA: Diagnosis not present

## 2022-09-22 DIAGNOSIS — Z5189 Encounter for other specified aftercare: Secondary | ICD-10-CM | POA: Diagnosis not present

## 2022-09-22 DIAGNOSIS — G620 Drug-induced polyneuropathy: Secondary | ICD-10-CM | POA: Diagnosis not present

## 2022-09-22 MED ORDER — PEGFILGRASTIM-CBQV 6 MG/0.6ML ~~LOC~~ SOSY
6.0000 mg | PREFILLED_SYRINGE | Freq: Once | SUBCUTANEOUS | Status: AC
  Administered 2022-09-22: 6 mg via SUBCUTANEOUS
  Filled 2022-09-22: qty 0.6

## 2022-09-22 MED ORDER — HEPARIN SOD (PORK) LOCK FLUSH 100 UNIT/ML IV SOLN
500.0000 [IU] | Freq: Once | INTRAVENOUS | Status: AC | PRN
  Administered 2022-09-22: 500 [IU]

## 2022-09-22 MED ORDER — SODIUM CHLORIDE 0.9% FLUSH
10.0000 mL | INTRAVENOUS | Status: DC | PRN
  Administered 2022-09-22: 10 mL

## 2022-09-27 ENCOUNTER — Ambulatory Visit (HOSPITAL_COMMUNITY)
Admission: RE | Admit: 2022-09-27 | Discharge: 2022-09-27 | Disposition: A | Discharge status: Home / Self Care | Payer: Medicare Other | Source: Ambulatory Visit | Attending: Hematology | Admitting: Hematology

## 2022-09-27 DIAGNOSIS — C182 Malignant neoplasm of ascending colon: Secondary | ICD-10-CM | POA: Insufficient documentation

## 2022-09-27 DIAGNOSIS — C189 Malignant neoplasm of colon, unspecified: Secondary | ICD-10-CM | POA: Diagnosis not present

## 2022-09-27 DIAGNOSIS — K3189 Other diseases of stomach and duodenum: Secondary | ICD-10-CM | POA: Diagnosis not present

## 2022-09-27 DIAGNOSIS — K5730 Diverticulosis of large intestine without perforation or abscess without bleeding: Secondary | ICD-10-CM | POA: Diagnosis not present

## 2022-09-27 DIAGNOSIS — R918 Other nonspecific abnormal finding of lung field: Secondary | ICD-10-CM | POA: Diagnosis not present

## 2022-09-27 MED ORDER — IOHEXOL 9 MG/ML PO SOLN
1000.0000 mL | ORAL | Status: AC
  Administered 2022-09-27: 1000 mL via ORAL

## 2022-09-27 MED ORDER — IOHEXOL 9 MG/ML PO SOLN
ORAL | Status: AC
  Filled 2022-09-27: qty 1000

## 2022-09-27 MED ORDER — SODIUM CHLORIDE (PF) 0.9 % IJ SOLN
INTRAMUSCULAR | Status: AC
  Filled 2022-09-27: qty 50

## 2022-09-27 MED ORDER — IOHEXOL 300 MG/ML  SOLN
100.0000 mL | Freq: Once | INTRAMUSCULAR | Status: AC | PRN
  Administered 2022-09-27: 100 mL via INTRAVENOUS

## 2022-09-29 ENCOUNTER — Other Ambulatory Visit: Payer: Medicare Other

## 2022-09-30 MED FILL — Dexamethasone Sodium Phosphate Inj 100 MG/10ML: INTRAMUSCULAR | Qty: 1 | Status: AC

## 2022-10-02 ENCOUNTER — Observation Stay (HOSPITAL_COMMUNITY)
Admission: EM | Admit: 2022-10-02 | Discharge: 2022-10-03 | Disposition: A | Discharge status: Home / Self Care | Payer: Medicare Other | Attending: Family Medicine | Admitting: Family Medicine

## 2022-10-02 ENCOUNTER — Telehealth: Payer: Self-pay | Admitting: Hematology

## 2022-10-02 ENCOUNTER — Encounter (HOSPITAL_COMMUNITY): Payer: Medicare Other

## 2022-10-02 ENCOUNTER — Emergency Department (HOSPITAL_COMMUNITY): Payer: Medicare Other

## 2022-10-02 ENCOUNTER — Encounter (HOSPITAL_COMMUNITY): Payer: Self-pay

## 2022-10-02 ENCOUNTER — Other Ambulatory Visit: Payer: Self-pay

## 2022-10-02 DIAGNOSIS — K219 Gastro-esophageal reflux disease without esophagitis: Secondary | ICD-10-CM | POA: Diagnosis present

## 2022-10-02 DIAGNOSIS — Z96653 Presence of artificial knee joint, bilateral: Secondary | ICD-10-CM | POA: Insufficient documentation

## 2022-10-02 DIAGNOSIS — I2699 Other pulmonary embolism without acute cor pulmonale: Principal | ICD-10-CM | POA: Diagnosis present

## 2022-10-02 DIAGNOSIS — I2694 Multiple subsegmental pulmonary emboli without acute cor pulmonale: Secondary | ICD-10-CM | POA: Diagnosis not present

## 2022-10-02 DIAGNOSIS — G620 Drug-induced polyneuropathy: Secondary | ICD-10-CM | POA: Diagnosis not present

## 2022-10-02 DIAGNOSIS — D72829 Elevated white blood cell count, unspecified: Secondary | ICD-10-CM

## 2022-10-02 DIAGNOSIS — Z853 Personal history of malignant neoplasm of breast: Secondary | ICD-10-CM | POA: Diagnosis not present

## 2022-10-02 DIAGNOSIS — D539 Nutritional anemia, unspecified: Secondary | ICD-10-CM | POA: Diagnosis not present

## 2022-10-02 DIAGNOSIS — D010 Carcinoma in situ of colon: Secondary | ICD-10-CM | POA: Diagnosis not present

## 2022-10-02 DIAGNOSIS — T451X5A Adverse effect of antineoplastic and immunosuppressive drugs, initial encounter: Secondary | ICD-10-CM | POA: Diagnosis present

## 2022-10-02 DIAGNOSIS — Z7984 Long term (current) use of oral hypoglycemic drugs: Secondary | ICD-10-CM | POA: Diagnosis not present

## 2022-10-02 DIAGNOSIS — E785 Hyperlipidemia, unspecified: Secondary | ICD-10-CM | POA: Diagnosis present

## 2022-10-02 DIAGNOSIS — E1169 Type 2 diabetes mellitus with other specified complication: Secondary | ICD-10-CM | POA: Diagnosis present

## 2022-10-02 DIAGNOSIS — E1140 Type 2 diabetes mellitus with diabetic neuropathy, unspecified: Secondary | ICD-10-CM | POA: Insufficient documentation

## 2022-10-02 DIAGNOSIS — I10 Essential (primary) hypertension: Secondary | ICD-10-CM | POA: Diagnosis present

## 2022-10-02 DIAGNOSIS — C182 Malignant neoplasm of ascending colon: Secondary | ICD-10-CM | POA: Diagnosis present

## 2022-10-02 DIAGNOSIS — R0602 Shortness of breath: Secondary | ICD-10-CM | POA: Diagnosis not present

## 2022-10-02 LAB — IRON AND TIBC
Iron: 65 ug/dL (ref 28–170)
Saturation Ratios: 17 % (ref 10.4–31.8)
TIBC: 378 ug/dL (ref 250–450)
UIBC: 313 ug/dL

## 2022-10-02 LAB — VITAMIN B12: Vitamin B-12: 1219 pg/mL — ABNORMAL HIGH (ref 180–914)

## 2022-10-02 LAB — CBC WITH DIFFERENTIAL/PLATELET
Abs Immature Granulocytes: 0 10*3/uL (ref 0.00–0.07)
Basophils Absolute: 0 10*3/uL (ref 0.0–0.1)
Basophils Relative: 0 %
Eosinophils Absolute: 0.2 10*3/uL (ref 0.0–0.5)
Eosinophils Relative: 2 %
HCT: 34.7 % — ABNORMAL LOW (ref 36.0–46.0)
Hemoglobin: 10.7 g/dL — ABNORMAL LOW (ref 12.0–15.0)
Lymphocytes Relative: 19 %
Lymphs Abs: 2.3 10*3/uL (ref 0.7–4.0)
MCH: 31.5 pg (ref 26.0–34.0)
MCHC: 30.8 g/dL (ref 30.0–36.0)
MCV: 102.1 fL — ABNORMAL HIGH (ref 80.0–100.0)
Monocytes Absolute: 0.6 10*3/uL (ref 0.1–1.0)
Monocytes Relative: 5 %
Neutro Abs: 9.1 10*3/uL — ABNORMAL HIGH (ref 1.7–7.7)
Neutrophils Relative %: 74 %
Platelets: 206 10*3/uL (ref 150–400)
RBC: 3.4 MIL/uL — ABNORMAL LOW (ref 3.87–5.11)
RDW: 17.3 % — ABNORMAL HIGH (ref 11.5–15.5)
WBC: 12.3 10*3/uL — ABNORMAL HIGH (ref 4.0–10.5)
nRBC: 0 % (ref 0.0–0.2)

## 2022-10-02 LAB — COMPREHENSIVE METABOLIC PANEL
ALT: 25 U/L (ref 0–44)
AST: 29 U/L (ref 15–41)
Albumin: 3.7 g/dL (ref 3.5–5.0)
Alkaline Phosphatase: 172 U/L — ABNORMAL HIGH (ref 38–126)
Anion gap: 8 (ref 5–15)
BUN: 19 mg/dL (ref 8–23)
CO2: 25 mmol/L (ref 22–32)
Calcium: 9.4 mg/dL (ref 8.9–10.3)
Chloride: 108 mmol/L (ref 98–111)
Creatinine, Ser: 0.95 mg/dL (ref 0.44–1.00)
GFR, Estimated: >60 mL/min (ref >60)
Glucose, Bld: 111 mg/dL — ABNORMAL HIGH (ref 70–99)
Potassium: 4.3 mmol/L (ref 3.5–5.1)
Sodium: 141 mmol/L (ref 135–145)
Total Bilirubin: 1 mg/dL (ref 0.3–1.2)
Total Protein: 6.8 g/dL (ref 6.5–8.1)

## 2022-10-02 LAB — HEPARIN LEVEL (UNFRACTIONATED): Heparin Unfractionated: 0.71 IU/mL — ABNORMAL HIGH (ref 0.30–0.70)

## 2022-10-02 LAB — GLUCOSE, CAPILLARY: Glucose-Capillary: 138 mg/dL — ABNORMAL HIGH (ref 70–99)

## 2022-10-02 LAB — FERRITIN: Ferritin: 186 ng/mL (ref 11–307)

## 2022-10-02 LAB — BRAIN NATRIURETIC PEPTIDE: B Natriuretic Peptide: 123.7 pg/mL — ABNORMAL HIGH (ref 0.0–100.0)

## 2022-10-02 LAB — FOLATE: Folate: >40 ng/mL (ref >5.9)

## 2022-10-02 LAB — LACTIC ACID, PLASMA: Lactic Acid, Venous: 1.7 mmol/L (ref 0.5–1.9)

## 2022-10-02 LAB — TROPONIN I (HIGH SENSITIVITY): Troponin I (High Sensitivity): 11 ng/L (ref <18)

## 2022-10-02 MED ORDER — ACETAMINOPHEN 650 MG RE SUPP: 650.0000 mg | Freq: Four times a day (QID) | RECTAL | Status: DC | PRN

## 2022-10-02 MED ORDER — HEPARIN BOLUS VIA INFUSION
2500.0000 [IU] | Freq: Once | INTRAVENOUS | Status: AC
  Administered 2022-10-02: 2500 [IU] via INTRAVENOUS
  Filled 2022-10-02: qty 2500

## 2022-10-02 MED ORDER — INSULIN ASPART 100 UNIT/ML IJ SOLN
0.0000 [IU] | Freq: Three times a day (TID) | INTRAMUSCULAR | Status: DC
  Filled 2022-10-02: qty 0.09

## 2022-10-02 MED ORDER — ACETAMINOPHEN 325 MG PO TABS: 650.0000 mg | ORAL_TABLET | Freq: Four times a day (QID) | ORAL | Status: DC | PRN

## 2022-10-02 MED ORDER — ONDANSETRON HCL 4 MG/2ML IJ SOLN: 4.0000 mg | Freq: Four times a day (QID) | INTRAMUSCULAR | Status: DC | PRN

## 2022-10-02 MED ORDER — SODIUM CHLORIDE 0.9% FLUSH: 10.0000 mL | INTRAVENOUS | Status: DC | PRN

## 2022-10-02 MED ORDER — HEPARIN (PORCINE) 25000 UT/250ML-% IV SOLN
1050.0000 [IU]/h | INTRAVENOUS | Status: DC
  Administered 2022-10-02: 1300 [IU]/h via INTRAVENOUS
  Filled 2022-10-02 (×2): qty 250

## 2022-10-02 MED ORDER — ONDANSETRON HCL 4 MG PO TABS: 4.0000 mg | ORAL_TABLET | Freq: Four times a day (QID) | ORAL | Status: DC | PRN

## 2022-10-02 MED ORDER — IOHEXOL 350 MG/ML SOLN
100.0000 mL | Freq: Once | INTRAVENOUS | Status: AC | PRN
  Administered 2022-10-02: 100 mL via INTRAVENOUS

## 2022-10-02 MED ORDER — CHLORHEXIDINE GLUCONATE CLOTH 2 % EX PADS
6.0000 | MEDICATED_PAD | Freq: Every day | CUTANEOUS | Status: DC
  Administered 2022-10-02 – 2022-10-03 (×2): 6 via TOPICAL

## 2022-10-02 NOTE — Telephone Encounter (Signed)
I received a call from radiology to discuss patient's CT chest abdomen pelvis results at 11:30 AM today.  CT scan was done on 09/27/2022 but was being read today. Radiologist noted that the patient has a large right-sided right main pulmonary artery embolism with multiple segmental clots. I called the patient and she does endorse new shortness of breath with significant dyspnea on exertion. She is on FOLFIRI plus Avastin and we discussed that Avastin can trigger blood clots along with the underlying cancer itself being a risk factor. Given the significant nature of the pulmonary embolism the patient was recommended to go to the [facility] emergency room immediately for further evaluation and management and to be started on anticoagulation. She will likely also need bilateral lower extremity ultrasounds. She was recommended not to drive herself and to have somebody drive her to the emergency room if she is not having too many symptoms or to call 911 if she is more symptomatic. Will forward this note to her oncologist Dr. Jeral Fruit MD MS Hematology/Oncology Physician [facility]

## 2022-10-02 NOTE — ED Provider Notes (Signed)
77 yo female here with shortness of breath  CT chest (not angiogram study) on 09/27/22 with suggestion of possible filling defect in right pulmonary artery at the hilum   Pending repeat CTA to evaluate for potential PE  Trop 11, labs near baseline Pt otherwise asymptomatic at this time  Physical Exam  BP (!) 120/51 (BP Location: Right Arm)   Pulse 66   Temp 98.5 F (36.9 C) (Oral)   Resp 15   Ht 5\' 5"  (1.651 m)   Wt 97.3 kg   SpO2 95%   BMI 35.68 kg/m   Physical Exam  Procedures  Procedures  ED Course / MDM   Clinical Course as of 10/02/22 2257  Wynelle Link Oct 02, 2022  1732 Admitted to hospitalist Dr Artis Flock [MT]    Clinical Course User Index [MT] Renaye Rakers Kermit Balo, MD   Medical Decision Making Amount and/or Complexity of Data Reviewed Labs: ordered. Radiology: ordered.  Risk Prescription drug management. Decision regarding hospitalization.   CT PE with multiple Pe's, no heart strain - trop flat  Admitted on heparin       Terald Sleeper, MD 10/02/22 2300

## 2022-10-02 NOTE — ED Triage Notes (Signed)
Pt had CT of chest last week, PCP called pt today and told ehr she has a blood clot in her right pulmonary artery with multiple small clots in the same area. Pt is not on blood thinners, hx of clots due to chemo medications. Denies SOB or other sx

## 2022-10-02 NOTE — H&P (Signed)
History and Physical    Patient: Kim Castro ZOX:[id number] DOB: 10-Feb-1946 DOA: 10/02/2022 DOS: the patient was seen and examined on 10/02/2022 PCP: [name], MD  Patient coming from: Home - lives with her husband. Uses a cane.    Chief Complaint: blood clot.   HPI: Kim Castro is a 77 y.o. female with medical history significant of hx of breast cancer and colon cancer, T2DM, GERD, hx of PE, HTN, IDA who presented to ED with complaints of blood clot seen on outpatient CT.  She had a CT on 5/28 for restaging of colon cancer and it was interpreted today by radiology with concern for right sided pulmonary emobli. Oncology called her and asked her to come to ED. History of previous PE years ago while on chemo for breast cancer. She denies any shortness of breath, leg swelling or cough. She mowed her yard yesterday and today. She had no shortness of breath,  just some fatigue.    She has been feeling good. Denies any fever/chills, vision changes/headaches, chest pain or palpitations, shortness of breath or cough, abdominal pain, N/V/D, dysuria or leg swelling.    She does not smoke or drink alcohol.    ER Course:  vitals: afebrile, bp: 172/67>103/77, HR: 83, RR: 16, oxygen: 100%RA Pertinent labs: wbc: 12.3, hgb: 10.7, bnp: 123, troponin wnl,  CTA chest: +bilateral pulmonary emboli, R>L. No findings of right heart strain. Diffuse mosaic attenuation bilaterally, which may be secondary to small airways disease. Scattered pulmonary nodules are unchanged.  In ED: started on heparin gtt. LE DVT study ordered. TR[facility] asked to admit.    Review of Systems: As mentioned in the history of present illness. All other systems reviewed and are negative. Past Medical History:  Diagnosis Date   Aortic atherosclerosis (HCC)    Arthritis    feet, lower back   Basal cell carcinoma    arm   Breast cancer of upper-outer quadrant of left female breast (HCC) 06/04/2014   Cataract    immature  on the left   Colon cancer (HCC) 08/2019   Diabetes mellitus without complication (HCC)    Diverticulosis    Dizziness    > 84yrs ago;took Antivert    Family history of anesthesia complication    sister slow to wake up with anesthesia   Family history of breast cancer    Family history of colon cancer    Family history of uterine cancer    GERD (gastroesophageal reflux disease)    takes occasional TUMs   History of bronchitis    > 47yrs ago   History of colon polyps    History of hiatal hernia    Small noted on CT   History of pulmonary embolus (PE)    Hypertension    takes Losartan daily and HCTZ   Iron deficiency anemia    Joint pain    Numbness    to toes on each foot   Peripheral neuropathy    feet and toes   Personal history of radiation therapy    Pulmonary nodules    Noted on CT   Radiation 07/31/14-08/28/14   Left Breast 20 fxs   Seasonal allergies    takes Claritin prn   Urinary frequency    Vitamin D deficiency    takes VIt D daily   Past Surgical History:  Procedure Laterality Date   BREAST BIOPSY Bilateral    BREAST LUMPECTOMY Left    BREAST LUMPECTOMY WITH RADIOACTIVE SEED LOCALIZATION  Left 07/01/2014   Procedure: LEFT BREAST LUMPECTOMY WITH RADIOACTIVE SEED LOCALIZATION;  Surgeon: Glenna Fellows, MD;  Location: [facility];  Service: General;  Laterality: Left;   CATARACT EXTRACTION Right    COLONOSCOPY  09/24/2019   [place]   LAPAROSCOPIC PARTIAL COLECTOMY N/A 11/01/2019   Procedure: LAPAROSCOPIC PARTIAL COLECTOMY;  Surgeon: [name], MD;  Location: [facility];  Service: General;  Laterality: N/A;   POLYPECTOMY     PORTACATH PLACEMENT N/A 12/10/2019   Procedure: INSERTION PORT-A-CATH ULTRASOUND GUIDED IN RIGHT IJ;  Surgeon: [name], MD;  Location: [facility];  Service: General;  Laterality: N/A;   ROBOTIC ASSISTED TOTAL HYSTERECTOMY Bilateral 12/22/2021   Procedure: XI ROBOTIC ASSISTED TOTAL HYSTERECTOMY WITH BILATERAL SALPINGO  OOPHORECTOMY;  Surgeon: Carver Fila, MD;  Location: [facility];  Service: Gynecology;  Laterality: Bilateral;   TOTAL KNEE ARTHROPLASTY Left 10/24/2012   Procedure: TOTAL KNEE ARTHROPLASTY;  Surgeon: Nestor Lewandowsky, MD;  Location: [facility];  Service: Orthopedics;  Laterality: Left;   TOTAL KNEE ARTHROPLASTY Right 01/09/2013   Procedure: TOTAL KNEE ARTHROPLASTY;  Surgeon: Nestor Lewandowsky, MD;  Location: [facility];  Service: Orthopedics;  Laterality: Right;   TUBAL LIGATION     Social History:  reports that she has never smoked. She has never used smokeless tobacco. She reports that she does not drink alcohol and does not use drugs.  Allergies  Allergen Reactions   Oxycodone Nausea And Vomiting    Family History  Problem Relation Age of Onset   Diabetes Mother    Uterine cancer Mother        deceased 63   Hypertension Father    Heart disease Father    Diabetes Sister    Breast cancer Sister 61       currently 21   Hypertension Sister    Diabetes Sister    Breast cancer Sister    Diabetes Sister    Kidney disease Sister    Heart disease Sister    Lupus Sister    Heart disease Sister    Diabetes Sister    Diabetes Sister    Hypertension Brother    Diabetes Brother    Colon cancer Brother 74   Colon polyps Daughter    Thyroid cancer Daughter 64       currently 51; type?   Cancer Maternal Uncle        unk. primary; deceased 67s   Stroke Maternal Grandmother    Esophageal cancer Neg Hx    Rectal cancer Neg Hx    Stomach cancer Neg Hx     Prior to Admission medications   Medication Sig Start Date End Date Taking? Authorizing Provider  acetaminophen (TYLENOL) 325 MG tablet Take 650 mg by mouth every 6 (six) hours as needed for moderate pain.    [provider]  b complex vitamins capsule Take 1 capsule by mouth daily.    [provider]  Cholecalciferol (VITAMIN D) 2000 UNITS CAPS Take 2,000 Units by mouth daily.     [provider]  Ibuprofen 200 MG  CAPS Take by mouth.    [provider]  loratadine (CLARITIN) 10 MG tablet Take 10 mg by mouth daily as needed for allergies.    [provider]  losartan (COZAAR) 25 MG tablet Take 1 tablet (25 mg total) by mouth daily. 11/30/21   [name], MD  metFORMIN (GLUCOPHAGE) 500 MG tablet Take 1 tablet (500 mg total) by mouth in the morning  and at bedtime. 11/30/21   [name], MD  Multiple Vitamins-Minerals (MULTIVITAMIN WITH MINERALS) tablet Take 1 tablet by mouth daily.    [provider]  omeprazole (PRILOSEC) 20 MG capsule Take 20 mg by mouth daily before breakfast.     [provider]  ondansetron (ZOFRAN) 8 MG tablet Take 1 tablet (8 mg total) by mouth every 8 (eight) hours as needed for nausea or vomiting. Start on the third day after chemotherapy. 08/08/22   [name], MD  potassium chloride (KLOR-CON) 10 MEQ tablet Take 1 tablet (10 mEq total) by mouth daily. 09/05/22   [name], MD  pravastatin (PRAVACHOL) 10 MG tablet Take 1 tablet (10 mg total) by mouth daily. 11/30/21   [name], MD  pregabalin (LYRICA) 25 MG capsule Take 1 capsule (25 mg total) by mouth 2 (two) times daily. 09/20/22   Pollyann Samples, NP  prochlorperazine (COMPAZINE) 10 MG tablet Take 1 tablet (10 mg total) by mouth every 6 (six) hours as needed for nausea or vomiting. 08/08/22   [name], MD  triamcinolone ointment (KENALOG) 0.5 % Apply 1 Application topically 2 (two) times daily. 09/20/22   Pollyann Samples, NP    Physical Exam: Vitals:   10/02/22 1530 10/02/22 1637 10/02/22 1730 10/02/22 1800  BP: 103/77  (!) 128/57 124/67  Pulse: 63  60 64  Resp: (!) 22  12 18   Temp:  97.7 F (36.5 C)  97.8 F (36.6 C)  TempSrc:  Oral    SpO2: 95%  99% 100%  Weight:      Height:       General:  Appears calm and comfortable and is in NAD Eyes:  PERRL, EOMI, normal lids, iris ENT:  grossly normal hearing, lips & tongue, mmm; appropriate dentition Neck:  no LAD, masses or thyromegaly;  no carotid bruits Cardiovascular:  RRR, no m/r/g. No LE edema. Port over right upper chest wall.  Respiratory:   CTA bilaterally with no wheezes/rales/rhonchi.  Normal respiratory effort. Abdomen:  soft, NT, ND, NABS Back:   normal alignment, no CVAT Skin:  no rash or induration seen on limited exam Musculoskeletal:  grossly normal tone BUE/BLE, good ROM, no bony abnormality Lower extremity:  No LE edema.  Limited foot exam with no ulcerations.  2+ distal pulses. Psychiatric:  grossly normal mood and affect, speech fluent and appropriate, AOx3 Neurologic:  CN 2-12 grossly intact, moves all extremities in coordinated fashion, sensation intact   Radiological Exams on Admission: Independently reviewed - see discussion in A/P where applicable  CT Angio Chest PE W and/or Wo Contrast  Result Date: 10/02/2022 CLINICAL DATA:  PE detected on outpatient CT. History of metastatic colon cancer EXAM: CT ANGIOGRAPHY CHEST WITH CONTRAST TECHNIQUE: Multidetector CT imaging of the chest was performed using the standard protocol during bolus administration of intravenous contrast. Multiplanar CT image reconstructions and MIPs were obtained to evaluate the vascular anatomy. RADIATION DOSE REDUCTION: This exam was performed according to the departmental dose-optimization program which includes automated exposure control, adjustment of the mA and/or kV according to patient size and/or use of iterative reconstruction technique. CONTRAST:  OMNIPAQUE IOHEXOL 350 MG/ML SOLN COMPARISON:  CT 09/27/2022. FINDINGS: Cardiovascular: Satisfactory opacification of the pulmonary arteries. Filling defect within the distal right main pulmonary artery extending into all lobar branches as well as numerous segmental and subsegmental branch arteries of the right upper, right middle, and right lower lobes. No saddle embolism. Smaller burden of left-sided pulmonary emboli with  involvement of segmental branch arteries of the left upper  and left lower lobes. RV to LV ratio of 0.77 without findings to suggest right heart strain. Thoracic aorta is nonaneurysmal. Scattered atherosclerotic calcifications of the aorta. Heart size is upper limits of normal. No pericardial effusion. Right IJ approach chest port remains in place. Mediastinum/Nodes: Similar subcentimeter mediastinal lymph nodes. No axillary or hilar lymphadenopathy. Trachea and esophagus within normal limits. Lungs/Pleura: Unchanged subpleural radiation fibrosis of the anterior left lung. Diffuse mosaic attenuation bilaterally. Scattered pulmonary nodules are unchanged, largest within the left lower lobe measuring 5 mm (series 12, image 57). No new airspace consolidation. No pleural effusion or pneumothorax. Upper Abdomen: No new or acute abnormality within the included upper abdomen. See recently obtained dedicated CT abdomen pelvis. Musculoskeletal: Postsurgical changes within the left breast. No new or acute bony or chest wall findings. Review of the MIP images confirms the above findings. IMPRESSION: 1. Positive for bilateral pulmonary emboli, right greater than left, as described above. No saddle embolism. No findings to suggest right heart strain. 2. Diffuse mosaic attenuation bilaterally, which may be secondary to small airways disease. 3. Scattered pulmonary nodules are unchanged, largest within the left lower lobe measuring 5 mm. Continued surveillance per oncology protocols 4. Aortic atherosclerosis (ICD10-I70.0). Electronically Signed   By: Duanne Guess D.O.   On: 10/02/2022 16:06    EKG: Independently reviewed.  NSR with rate 64; nonspecific ST changes with no evidence of acute ischemia. LVH    Labs on Admission: I have personally reviewed the available labs and imaging studies at the time of the admission.  Pertinent labs:   wbc: 12.3,  hgb: 10.7,  bnp: 123,  troponin wnl,  Assessment and Plan: Principal Problem:   Pulmonary embolism (HCC) Active Problems:    Macrocytic anemia   Leukocytosis   Malignant neoplasm of ascending colon (HCC)   Type 2 diabetes mellitus with hyperlipidemia (HCC)   Essential hypertension   Peripheral neuropathy due to chemotherapy (HCC)   Hyperlipidemia   Gastroesophageal reflux disease    Assessment and Plan: * Pulmonary embolism (HCC) 77 year old female with known colon cancer undergoing chemo who was sent to ED after CT done outpatient showed concerning findings of PE. She is essentially asymptomatic. Repeat CTA chest shows bilateral pulmonary emboli R>L. No findings of right heart strain, but large clot burden.  -obs to tele/progressive  -continue heparin gtt with plan to transition to DOAC. SW consult for medication -hx of PE with colon cancer during spring of 2022.  Felt provoked at that time. On treatment x 6 months.  -DVT studies pending -troponin wnl, BNP at baseline.  -check echo  -message sent to her oncologist by EDP   Macrocytic anemia Dx of IDA.  No recent iron studies.  Will check iron panel/b12/folate At baseline. Follow with heparin   Leukocytosis Chronically elevated since 08/2022 No signs of infection  Will give some IVF, trend and follow fever curve   Malignant neoplasm of ascending colon (HCC) -Diagnosed in 08/2019 , staging CT chest showed multiple small lung nodules that were nonspecific but likely benign.  -S/p surgery with Dr Maisie Fus on 11/01/19. Path showed overall Stage IIIB cancer.  -S/p 6 months of adjuvant FOLFOX completed 05/2020.  -Due to rising CEA she underwent a PET scan 11/10/2021 which showed hypermetabolism and enlargement to the left ovary, no other evidence of metastatic disease  -S/p total hysterectomy 12/22/2021 by Dr. Pricilla Holm, path revealed metastatic moderately differentiated colonic adenocarcinoma involving both ovaries  with peritoneal deposits also positive for metastatic mucinous adenocarcinoma consistent with colorectal primary -Foundation One showed K-ras mutation,  unfortunately she is not eligible for any targeted therapy -She restarted FOLFOX 01/24/2022 - 04/2022, stopped due to disease progression. Started FOLFIRI/Beva -Receives chemo q 2 weeks -due for infusion tomorrow   Type 2 diabetes mellitus with hyperlipidemia (HCC) Recent A1C 6.2 Hold metformin SSI and accuchecks QAC/HS  Essential hypertension Blood pressure initially quite elevated with repeat to goal.  Continue cozaar   Peripheral neuropathy due to chemotherapy (HCC) Continue lyrica BID   Hyperlipidemia Continue pravachol 10mg  daily   Gastroesophageal reflux disease Continue PPI     Advance Care Planning:   Code Status: Full Code   Consults: SW  DVT Prophylaxis: heparin gtt   Family Communication: none   Severity of Illness: The appropriate patient status for this patient is OBSERVATION. Observation status is judged to be reasonable and necessary in order to provide the required intensity of service to ensure the patient's safety. The patient's presenting symptoms, physical exam findings, and initial radiographic and laboratory data in the context of their medical condition is felt to place them at decreased risk for further clinical deterioration. Furthermore, it is anticipated that the patient will be medically stable for discharge from the hospital within 2 midnights of admission.   Author: Orland Mustard, MD 10/02/2022 6:17 PM  For on call review [email or web address].

## 2022-10-02 NOTE — Assessment & Plan Note (Signed)
-  Diagnosed in 08/2019 , staging CT chest showed multiple small lung nodules that were nonspecific but likely benign.  -S/p surgery with Dr Maisie Fus on 11/01/19. Path showed overall Stage IIIB cancer.  -S/p 6 months of adjuvant FOLFOX completed 05/2020.  -Due to rising CEA she underwent a PET scan 11/10/2021 which showed hypermetabolism and enlargement to the left ovary, no other evidence of metastatic disease  -S/p total hysterectomy 12/22/2021 by Dr. Pricilla Holm, path revealed metastatic moderately differentiated colonic adenocarcinoma involving both ovaries with peritoneal deposits also positive for metastatic mucinous adenocarcinoma consistent with colorectal primary -Foundation One showed K-ras mutation, unfortunately she is not eligible for any targeted therapy -She restarted FOLFOX 01/24/2022 - 04/2022, stopped due to disease progression. Started FOLFIRI/Beva -Receives chemo q 2 weeks -due for infusion tomorrow

## 2022-10-02 NOTE — Assessment & Plan Note (Signed)
Recent A1C 6.2 Hold metformin SSI and accuchecks QAC/HS

## 2022-10-02 NOTE — Assessment & Plan Note (Signed)
Dx of IDA.  No recent iron studies.  Will check iron panel/b12/folate At baseline. Follow with heparin

## 2022-10-02 NOTE — ED Provider Notes (Signed)
[place] EMERGENCY DEPARTMENT AT [facility] Provider Note   CSN: [id number] Arrival date & time: 10/02/22  1221     History  Chief Complaint  Patient presents with   Blood Clot    Kim Castro is a 77 y.o. female.  HPI 77 year old female presents with a pulmonary embolism. She is currently being treated malignant colon cancer.  She had a CT on 5/28 for restaging and it was interpreted today by radiology with concern for right sided pulmonary emboli.  Oncology called the patient to come into the ER.  She tells me she does not really feel short of breath and has not had any chest pain or dizziness.  She mowed her lawn this morning and felt like she was a little short of breath on the front side but was not sure if this was because she has a heavier mower that she used to.  Denies any leg swelling.  She has had a previous PE associated with chemotherapy years ago.  Is not on current anticoagulation.  Home Medications Prior to Admission medications   Medication Sig Start Date End Date Taking? Authorizing Provider  acetaminophen (TYLENOL) 325 MG tablet Take 650 mg by mouth every 6 (six) hours as needed for moderate pain.    [provider]  b complex vitamins capsule Take 1 capsule by mouth daily.    [provider]  Cholecalciferol (VITAMIN D) 2000 UNITS CAPS Take 2,000 Units by mouth daily.     [provider]  Ibuprofen 200 MG CAPS Take by mouth.    [provider]  loratadine (CLARITIN) 10 MG tablet Take 10 mg by mouth daily as needed for allergies.    [provider]  losartan (COZAAR) 25 MG tablet Take 1 tablet (25 mg total) by mouth daily. 11/30/21   [name], MD  metFORMIN (GLUCOPHAGE) 500 MG tablet Take 1 tablet (500 mg total) by mouth in the morning and at bedtime. 11/30/21   [name], MD  Multiple Vitamins-Minerals (MULTIVITAMIN WITH MINERALS) tablet Take 1 tablet by mouth daily.    [provider]   omeprazole (PRILOSEC) 20 MG capsule Take 20 mg by mouth daily before breakfast.     [provider]  ondansetron (ZOFRAN) 8 MG tablet Take 1 tablet (8 mg total) by mouth every 8 (eight) hours as needed for nausea or vomiting. Start on the third day after chemotherapy. 08/08/22   [name], MD  potassium chloride (KLOR-CON) 10 MEQ tablet Take 1 tablet (10 mEq total) by mouth daily. 09/05/22   [name], MD  pravastatin (PRAVACHOL) 10 MG tablet Take 1 tablet (10 mg total) by mouth daily. 11/30/21   [name], MD  pregabalin (LYRICA) 25 MG capsule Take 1 capsule (25 mg total) by mouth 2 (two) times daily. 09/20/22   Pollyann Samples, NP  prochlorperazine (COMPAZINE) 10 MG tablet Take 1 tablet (10 mg total) by mouth every 6 (six) hours as needed for nausea or vomiting. 08/08/22   [name], MD  triamcinolone ointment (KENALOG) 0.5 % Apply 1 Application topically 2 (two) times daily. 09/20/22   Pollyann Samples, NP      Allergies    Oxycodone    Review of Systems   Review of Systems  Respiratory:  Negative for shortness of breath.   Cardiovascular:  Negative for chest pain and leg swelling.  Neurological:  Negative for light-headedness.    Physical Exam Updated Vital Signs BP Marland Kitchen)  172/67 (BP Location: Right Arm)   Pulse 83   Temp 99.4 F (37.4 C) (Oral)   Resp 16   Ht 5\' 5"  (1.651 m)   Wt 97.3 kg   SpO2 100%   BMI 35.68 kg/m  Physical Exam Vitals and nursing note reviewed.  Constitutional:      General: She is not in acute distress.    Appearance: She is well-developed. She is not ill-appearing or diaphoretic.  HENT:     Head: Normocephalic and atraumatic.  Cardiovascular:     Rate and Rhythm: Normal rate and regular rhythm.     Heart sounds: Normal heart sounds.  Pulmonary:     Effort: Pulmonary effort is normal.     Breath sounds: Normal breath sounds.  Abdominal:     Palpations: Abdomen is soft.     Tenderness: There is no abdominal tenderness.  Musculoskeletal:      Right lower leg: No edema.     Left lower leg: No edema.  Skin:    General: Skin is warm and dry.  Neurological:     Mental Status: She is alert.     ED Results / Procedures / Treatments   Labs (all labs ordered are listed, but only abnormal results are displayed) Labs Reviewed  COMPREHENSIVE METABOLIC PANEL - Abnormal; Notable for the following components:      Result Value   Glucose, Bld 111 (*)    Alkaline Phosphatase 172 (*)    All other components within normal limits  BRAIN NATRIURETIC PEPTIDE - Abnormal; Notable for the following components:   B Natriuretic Peptide 123.7 (*)    All other components within normal limits  CBC WITH DIFFERENTIAL/PLATELET - Abnormal; Notable for the following components:   WBC 12.3 (*)    RBC 3.40 (*)    Hemoglobin 10.7 (*)    HCT 34.7 (*)    MCV 102.1 (*)    RDW 17.3 (*)    Neutro Abs 9.1 (*)    All other components within normal limits  LACTIC ACID, PLASMA  HEPARIN LEVEL (UNFRACTIONATED)  TROPONIN I (HIGH SENSITIVITY)    EKG EKG Interpretation  Date/Time:  Sunday October 02 2022 13:51:50 EDT Ventricular Rate:  64 PR Interval:  168 QRS Duration: 89 QT Interval:  404 QTC Calculation: 417 R Axis:   -9 Text Interpretation: Sinus rhythm Abnormal R-wave progression, early transition Left ventricular hypertrophy Anterior Q waves, possibly due to LVH similar to 2023 Confirmed by Pricilla Loveless [phone]) on 10/02/2022 3:22:08 PM  Radiology No results found.  Procedures Procedures    Medications Ordered in ED Medications  heparin ADULT infusion 100 units/mL (25000 units/257mL) (1,300 Units/hr Intravenous New Bag/Given 10/02/22 1412)  iohexol (OMNIPAQUE) 350 MG/ML injection 100 mL (has no administration in time range)  heparin bolus via infusion 2,500 Units (2,500 Units Intravenous Bolus from Bag 10/02/22 1415)    ED Course/ Medical Decision Making/ A&P                             Medical Decision Making Amount and/or Complexity of  Data Reviewed External Data Reviewed: radiology.    Details: Right sided PE Labs: ordered.    Details: Normal troponin. Chronic anemia Radiology: ordered. ECG/medicine tests: ordered and independent interpretation performed.    Details: No ischemia  Risk Prescription drug management.   Is now been several days since her original CT and she seems to be asymptomatic from a PE perspective.  Will get a formal CT angiogram and reassess.  She was started on IV heparin.  Labs such as troponin, BNP, lactate, are all reassuring in the setting of PE. Care to Dr. Renaye Rakers with CTA pending.        Final Clinical Impression(s) / ED Diagnoses Final diagnoses:  None    Rx / DC Orders ED Discharge Orders     None         Pricilla Loveless, MD 10/02/22 1544

## 2022-10-02 NOTE — Progress Notes (Signed)
ANTICOAGULATION CONSULT NOTE Pharmacy Consult for IV heparin Indication: pulmonary embolus  Allergies  Allergen Reactions   Oxycodone Nausea And Vomiting    Patient Measurements: Height: 5\' 5"  (165.1 cm) Weight: 97.3 kg (214 lb 6.4 oz) IBW/kg (Calculated) : 57 Heparin Dosing Weight: 79 kg  Vital Signs: Temp: 98.5 F (36.9 C) (06/02 2140) Temp Source: Oral (06/02 2140) BP: 120/51 (06/02 2140) Pulse Rate: 66 (06/02 2140)  Labs: Recent Labs    10/02/22 1415 10/02/22 2149  HGB 10.7*  --   HCT 34.7*  --   PLT 206  --   HEPARINUNFRC  --  0.71*  CREATININE 0.95  --   TROPONINIHS 11  --     Estimated Creatinine Clearance: 60 mL/min (by C-G formula based on SCr of 0.95 mg/dL).   Medical History: Past Medical History:  Diagnosis Date   Aortic atherosclerosis (HCC)    Arthritis    feet, lower back   Basal cell carcinoma    arm   Breast cancer of upper-outer quadrant of left female breast (HCC) 06/04/2014   Cataract    immature on the left   Colon cancer (HCC) 08/2019   Diabetes mellitus without complication (HCC)    Diverticulosis    Dizziness    > 47yrs ago;took Antivert    Family history of anesthesia complication    sister slow to wake up with anesthesia   Family history of breast cancer    Family history of colon cancer    Family history of uterine cancer    GERD (gastroesophageal reflux disease)    takes occasional TUMs   History of bronchitis    > 19yrs ago   History of colon polyps    History of hiatal hernia    Small noted on CT   History of pulmonary embolus (PE)    Hypertension    takes Losartan daily and HCTZ   Iron deficiency anemia    Joint pain    Numbness    to toes on each foot   Peripheral neuropathy    feet and toes   Personal history of radiation therapy    Pulmonary nodules    Noted on CT   Radiation 07/31/14-08/28/14   Left Breast 20 fxs   Seasonal allergies    takes Claritin prn   Urinary frequency    Vitamin D deficiency     takes VIt D daily    Medications:  Scheduled:  Infusions:   Assessment: 77 yo presents to ER with SOB and new PE per Dr. Candise Che. Note patient with metastatic colon cancer on FOLFIRI/Avastain with last dose being on 09/05/22 and restaging was scheduled for 5/28 and that's where the PE was found.  To start IV heparin per pharmacy. Note patient had submassive PE back in 2021. Not currently on any anticoagulants.  Baseline Hg slightly low at 10.7, pltc WNL.  10/02/2022: Initial heparin level 0.71- slightly above goal range on IV heparin 1300 units/hr.  Labs drawn from Port-a-cath; heparin infusing peripherally. No bleeding or infusion related concerns reported by RN  Goal of Therapy:  Heparin level 0.3-0.7 units/ml Monitor platelets by anticoagulation protocol: Yes   Plan:  Decrease Heparin infusion to 1200 units/hr Recheck heparin level 8 hours after heparin decreased Daily CBC and heparin level F/U long-term anticoagulation plan  Junita Push PharmD 10/02/2022,10:26 PM

## 2022-10-02 NOTE — Assessment & Plan Note (Addendum)
Blood pressure initially quite elevated with repeat to goal.  Continue cozaar

## 2022-10-02 NOTE — Assessment & Plan Note (Signed)
Chronically elevated since 08/2022 No signs of infection  Will give some IVF, trend and follow fever curve

## 2022-10-02 NOTE — ED Notes (Signed)
ED TO INPATIENT HANDOFF REPORT  ED Nurse Name and Phone #: Dorene Sorrow Name/Age/Gender Kim Castro 77 y.o. female Room/Bed: WA12/WA12  Code Status   Code Status: Prior  Home/SNF/Other Home Patient oriented to: self, place, time, and situation Is this baseline? Yes   Triage Complete: Triage complete  Chief Complaint Pulmonary embolism [facility]) [I26.99]  Triage Note Pt had CT of chest last week, PCP called pt today and told ehr she has a blood clot in her right pulmonary artery with multiple small clots in the same area. Pt is not on blood thinners, hx of clots due to chemo medications. Denies SOB or other sx   Allergies Allergies  Allergen Reactions   Oxycodone Nausea And Vomiting    Level of Care/Admitting Diagnosis ED Disposition     ED Disposition  Admit   Condition  --   Comment  Hospital Area: [facility] [[id number]]  Level of Care: Telemetry [5]  Admit to tele based on following criteria: Other see comments  Comments: PE with large clot burden  May place patient in observation at [facility] or [facility] if equivalent level of care is available:: No  Covid Evaluation: Asymptomatic - no recent exposure (last 10 days) testing not required  Diagnosis: Pulmonary embolism (HCC) [241700]  Admitting Physician: Orland Mustard [[id number]]  Attending Physician: Orland Mustard [[id number]]          B Medical/Surgery History Past Medical History:  Diagnosis Date   Aortic atherosclerosis (HCC)    Arthritis    feet, lower back   Basal cell carcinoma    arm   Breast cancer of upper-outer quadrant of left female breast (HCC) 06/04/2014   Cataract    immature on the left   Colon cancer (HCC) 08/2019   Diabetes mellitus without complication (HCC)    Diverticulosis    Dizziness    > 21yrs ago;took Antivert    Family history of anesthesia complication    sister slow to wake up with anesthesia   Family history of breast cancer    Family history  of colon cancer    Family history of uterine cancer    GERD (gastroesophageal reflux disease)    takes occasional TUMs   History of bronchitis    > 72yrs ago   History of colon polyps    History of hiatal hernia    Small noted on CT   History of pulmonary embolus (PE)    Hypertension    takes Losartan daily and HCTZ   Iron deficiency anemia    Joint pain    Numbness    to toes on each foot   Peripheral neuropathy    feet and toes   Personal history of radiation therapy    Pulmonary nodules    Noted on CT   Radiation 07/31/14-08/28/14   Left Breast 20 fxs   Seasonal allergies    takes Claritin prn   Urinary frequency    Vitamin D deficiency    takes VIt D daily   Past Surgical History:  Procedure Laterality Date   BREAST BIOPSY Bilateral    BREAST LUMPECTOMY Left    BREAST LUMPECTOMY WITH RADIOACTIVE SEED LOCALIZATION Left 07/01/2014   Procedure: LEFT BREAST LUMPECTOMY WITH RADIOACTIVE SEED LOCALIZATION;  Surgeon: Glenna Fellows, MD;  Location: [facility];  Service: General;  Laterality: Left;   CATARACT EXTRACTION Right    COLONOSCOPY  09/24/2019   [place]   LAPAROSCOPIC PARTIAL COLECTOMY N/A 11/01/2019  Procedure: LAPAROSCOPIC PARTIAL COLECTOMY;  Surgeon: [name], MD;  Location: [facility];  Service: General;  Laterality: N/A;   POLYPECTOMY     PORTACATH PLACEMENT N/A 12/10/2019   Procedure: INSERTION PORT-A-CATH ULTRASOUND GUIDED IN RIGHT IJ;  Surgeon: [name], MD;  Location: [facility];  Service: General;  Laterality: N/A;   ROBOTIC ASSISTED TOTAL HYSTERECTOMY Bilateral 12/22/2021   Procedure: XI ROBOTIC ASSISTED TOTAL HYSTERECTOMY WITH BILATERAL SALPINGO OOPHORECTOMY;  Surgeon: Carver Fila, MD;  Location: [facility];  Service: Gynecology;  Laterality: Bilateral;   TOTAL KNEE ARTHROPLASTY Left 10/24/2012   Procedure: TOTAL KNEE ARTHROPLASTY;  Surgeon: Nestor Lewandowsky, MD;  Location: [facility];  Service: Orthopedics;  Laterality: Left;   TOTAL KNEE  ARTHROPLASTY Right 01/09/2013   Procedure: TOTAL KNEE ARTHROPLASTY;  Surgeon: Nestor Lewandowsky, MD;  Location: [facility];  Service: Orthopedics;  Laterality: Right;   TUBAL LIGATION       A IV Location/Drains/Wounds Patient Lines/Drains/Airways Status     Active Line/Drains/Airways     Name Placement date Placement time Site Days   Implanted Port 12/10/19 12/10/19  0945  --  1027   Peripheral IV 10/02/22 20 G Left Antecubital 10/02/22  1400  Antecubital  less than 1   NG/OG Vented/Dual Lumen Oral 12/22/21  1628  Oral  284   Incision - 6 Ports Abdomen Left;Lateral;Lower Left;Lateral;Upper Umbilicus;Superior Right;Lateral;Lower Right;Medial;Upper Right;Lateral;Upper 12/22/21  1634  -- 284            Intake/Output Last 24 hours No intake or output data in the 24 hours ending 10/02/22 1814  Labs/Imaging Results for orders placed or performed during the hospital encounter of 10/02/22 (from the past 48 hour(s))  Comprehensive metabolic panel     Status: Abnormal   Collection Time: 10/02/22  2:15 PM  Result Value Ref Range   Sodium 141 135 - 145 mmol/L   Potassium 4.3 3.5 - 5.1 mmol/L   Chloride 108 98 - 111 mmol/L   CO2 25 22 - 32 mmol/L   Glucose, Bld 111 (H) 70 - 99 mg/dL    Comment: Glucose reference range applies only to samples taken after fasting for at least 8 hours.   BUN 19 8 - 23 mg/dL   Creatinine, Ser 1.61 0.44 - 1.00 mg/dL   Calcium 9.4 8.9 - 09.6 mg/dL   Total Protein 6.8 6.5 - 8.1 g/dL   Albumin 3.7 3.5 - 5.0 g/dL   AST 29 15 - 41 U/L   ALT 25 0 - 44 U/L   Alkaline Phosphatase 172 (H) 38 - 126 U/L   Total Bilirubin 1.0 0.3 - 1.2 mg/dL   GFR, Estimated >04 >54 mL/min    Comment: (NOTE) Calculated using the CKD-EPI Creatinine Equation (2021)    Anion gap 8 5 - 15    Comment: Performed at [facility], [address], [place], [place] [address]  Brain natriuretic peptide     Status: Abnormal   Collection Time: 10/02/22  2:15 PM  Result Value Ref  Range   B Natriuretic Peptide 123.7 (H) 0.0 - 100.0 pg/mL    Comment: Performed at [facility], [address], [place], [place] [address]  Troponin I (High Sensitivity)     Status: None   Collection Time: 10/02/22  2:15 PM  Result Value Ref Range   Troponin I (High Sensitivity) 11 <18 ng/L    Comment: (NOTE) Elevated high sensitivity troponin I (hsTnI) values and significant  changes across serial  measurements may suggest ACS but many other  chronic and acute conditions are known to elevate hsTnI results.  Refer to the "Links" section for chest pain algorithms and additional  guidance. Performed at [facility], [address] [place], [place] [address]   Lactic acid, plasma     Status: None   Collection Time: 10/02/22  2:15 PM  Result Value Ref Range   Lactic Acid, Venous 1.7 0.5 - 1.9 mmol/L    Comment: Performed at [facility], [address] [place], [place] [address]  CBC with Differential     Status: Abnormal   Collection Time: 10/02/22  2:15 PM  Result Value Ref Range   WBC 12.3 (H) 4.0 - 10.5 K/uL   RBC 3.40 (L) 3.87 - 5.11 MIL/uL   Hemoglobin 10.7 (L) 12.0 - 15.0 g/dL   HCT 09.8 (L) 11.9 - 14.7 %   MCV 102.1 (H) 80.0 - 100.0 fL   MCH 31.5 26.0 - 34.0 pg   MCHC 30.8 30.0 - 36.0 g/dL   RDW 82.9 (H) 56.2 - 13.0 %   Platelets 206 150 - 400 K/uL   nRBC 0.0 0.0 - 0.2 %   Neutrophils Relative % 74 %   Neutro Abs 9.1 (H) 1.7 - 7.7 K/uL   Lymphocytes Relative 19 %   Lymphs Abs 2.3 0.7 - 4.0 K/uL   Monocytes Relative 5 %   Monocytes Absolute 0.6 0.1 - 1.0 K/uL   Eosinophils Relative 2 %   Eosinophils Absolute 0.2 0.0 - 0.5 K/uL   Basophils Relative 0 %   Basophils Absolute 0.0 0.0 - 0.1 K/uL   Abs Immature Granulocytes 0.00 0.00 - 0.07 K/uL    Comment: Performed at [facility], [address], [place], [place] [address]   CT Angio Chest PE W and/or Wo Contrast  Result Date:  10/02/2022 CLINICAL DATA:  PE detected on outpatient CT. History of metastatic colon cancer EXAM: CT ANGIOGRAPHY CHEST WITH CONTRAST TECHNIQUE: Multidetector CT imaging of the chest was performed using the standard protocol during bolus administration of intravenous contrast. Multiplanar CT image reconstructions and MIPs were obtained to evaluate the vascular anatomy. RADIATION DOSE REDUCTION: This exam was performed according to the departmental dose-optimization program which includes automated exposure control, adjustment of the mA and/or kV according to patient size and/or use of iterative reconstruction technique. CONTRAST:  OMNIPAQUE IOHEXOL 350 MG/ML SOLN COMPARISON:  CT 09/27/2022. FINDINGS: Cardiovascular: Satisfactory opacification of the pulmonary arteries. Filling defect within the distal right main pulmonary artery extending into all lobar branches as well as numerous segmental and subsegmental branch arteries of the right upper, right middle, and right lower lobes. No saddle embolism. Smaller burden of left-sided pulmonary emboli with involvement of segmental branch arteries of the left upper and left lower lobes. RV to LV ratio of 0.77 without findings to suggest right heart strain. Thoracic aorta is nonaneurysmal. Scattered atherosclerotic calcifications of the aorta. Heart size is upper limits of normal. No pericardial effusion. Right IJ approach chest port remains in place. Mediastinum/Nodes: Similar subcentimeter mediastinal lymph nodes. No axillary or hilar lymphadenopathy. Trachea and esophagus within normal limits. Lungs/Pleura: Unchanged subpleural radiation fibrosis of the anterior left lung. Diffuse mosaic attenuation bilaterally. Scattered pulmonary nodules are unchanged, largest within the left lower lobe measuring 5 mm (series 12, image 57). No new airspace consolidation. No pleural effusion or pneumothorax. Upper Abdomen: No new or acute abnormality within the included upper  abdomen. See recently obtained  dedicated CT abdomen pelvis. Musculoskeletal: Postsurgical changes within the left breast. No new or acute bony or chest wall findings. Review of the MIP images confirms the above findings. IMPRESSION: 1. Positive for bilateral pulmonary emboli, right greater than left, as described above. No saddle embolism. No findings to suggest right heart strain. 2. Diffuse mosaic attenuation bilaterally, which may be secondary to small airways disease. 3. Scattered pulmonary nodules are unchanged, largest within the left lower lobe measuring 5 mm. Continued surveillance per oncology protocols 4. Aortic atherosclerosis (ICD10-I70.0). Electronically Signed   By: Duanne Guess D.O.   On: 10/02/2022 16:06    Pending Labs Unresulted Labs (From admission, onward)     Start     Ordered   10/03/22 0500  CBC  Daily,   R      10/02/22 1347   10/02/22 2200  Heparin level (unfractionated)  ONCE - URGENT,   URGENT        10/02/22 1426   10/02/22 1741  Ferritin  Once,   R        10/02/22 1740   10/02/22 1741  Vitamin B12  Once,   R        10/02/22 1740   10/02/22 1741  Folate  Once,   R        10/02/22 1740   10/02/22 1740  Iron and TIBC  Once,   R        10/02/22 1740            Vitals/Pain Today's Vitals   10/02/22 1530 10/02/22 1637 10/02/22 1730 10/02/22 1800  BP: 103/77  (!) 128/57 124/67  Pulse: 63  60 64  Resp: (!) 22  12 18   Temp:  97.7 F (36.5 C)  97.8 F (36.6 C)  TempSrc:  Oral    SpO2: 95%  99% 100%  Weight:      Height:      PainSc:        Isolation Precautions No active isolations  Medications Medications  heparin ADULT infusion 100 units/mL (25000 units/236mL) (1,300 Units/hr Intravenous New Bag/Given 10/02/22 1412)  insulin aspart (novoLOG) injection 0-9 Units (has no administration in time range)  heparin bolus via infusion 2,500 Units (2,500 Units Intravenous Bolus from Bag 10/02/22 1415)  iohexol (OMNIPAQUE) 350 MG/ML injection 100 mL (100 mLs  Intravenous Contrast Given 10/02/22 1542)    Mobility walks     Focused Assessments    R Recommendations: See Admitting Provider Note  Report given to:   Additional Notes:

## 2022-10-02 NOTE — Assessment & Plan Note (Signed)
Continue lyrica BID

## 2022-10-02 NOTE — Assessment & Plan Note (Addendum)
77 year old female with known colon cancer undergoing chemo who was sent to ED after CT done outpatient showed concerning findings of PE. She is essentially asymptomatic. Repeat CTA chest shows bilateral pulmonary emboli R>L. No findings of right heart strain, but large clot burden.  -obs to tele/progressive  -continue heparin gtt with plan to transition to DOAC. SW consult for medication -hx of PE with colon cancer during spring of 2022.  Felt provoked at that time. On treatment x 6 months.  -DVT studies pending -troponin wnl, BNP at baseline.  -check echo  -message sent to her oncologist by EDP

## 2022-10-02 NOTE — Progress Notes (Signed)
ANTICOAGULATION CONSULT NOTE - Initial Consult  Pharmacy Consult for IV heparin Indication: pulmonary embolus  Allergies  Allergen Reactions   Oxycodone Nausea And Vomiting    Patient Measurements: Height: 5\' 5"  (165.1 cm) Weight: 97.3 kg (214 lb 6.4 oz) IBW/kg (Calculated) : 57 Heparin Dosing Weight: 79 kg  Vital Signs: Temp: 99.4 F (37.4 C) (06/02 1226) Temp Source: Oral (06/02 1226) BP: 172/67 (06/02 1226) Pulse Rate: 83 (06/02 1226)  Labs: No results for input(s): "HGB", "HCT", "PLT", "APTT", "LABPROT", "INR", "HEPARINUNFRC", "HEPRLOWMOCWT", "CREATININE", "CKTOTAL", "CKMB", "TROPONINIHS" in the last 72 hours.  Estimated Creatinine Clearance: 71.2 mL/min (by C-G formula based on SCr of 0.78 mg/dL).   Medical History: Past Medical History:  Diagnosis Date   Aortic atherosclerosis (HCC)    Arthritis    feet, lower back   Basal cell carcinoma    arm   Breast cancer of upper-outer quadrant of left female breast (HCC) 06/04/2014   Cataract    immature on the left   Colon cancer (HCC) 08/2019   Diabetes mellitus without complication (HCC)    Diverticulosis    Dizziness    > 67yrs ago;took Antivert    Family history of anesthesia complication    sister slow to wake up with anesthesia   Family history of breast cancer    Family history of colon cancer    Family history of uterine cancer    GERD (gastroesophageal reflux disease)    takes occasional TUMs   History of bronchitis    > 41yrs ago   History of colon polyps    History of hiatal hernia    Small noted on CT   History of pulmonary embolus (PE)    Hypertension    takes Losartan daily and HCTZ   Iron deficiency anemia    Joint pain    Numbness    to toes on each foot   Peripheral neuropathy    feet and toes   Personal history of radiation therapy    Pulmonary nodules    Noted on CT   Radiation 07/31/14-08/28/14   Left Breast 20 fxs   Seasonal allergies    takes Claritin prn   Urinary frequency     Vitamin D deficiency    takes VIt D daily    Medications:  Scheduled:  Infusions:   Assessment: 76 yo presents to ER with SOB and new PE per Dr. Candise Che. Note patient with metastatic colon cancer on FOLFIRI/Avastain with last dose being on 09/05/22 and restaging was scheduled for 5/28 and that's where the PE was found. To start IV heparin per pharmacy. Baseline labs need drawn. Note patient had submassive PE back in 2021. Not currently on any anticoagulants.   Goal of Therapy:  Heparin level 0.3-0.7 units/ml Monitor platelets by anticoagulation protocol: Yes   Plan:  After baseline labs drawn, start IV heparin bolus of 2500 units then IV Heparin infusion rate of 1300 units/hr Check heparin level 8 hours after heparin starts Daily CBC and heparin level   Berkley Harvey 10/02/2022,1:38 PM

## 2022-10-02 NOTE — Assessment & Plan Note (Signed)
Continue pravachol 10 mg daily  

## 2022-10-02 NOTE — Assessment & Plan Note (Signed)
Continue PPI ?

## 2022-10-03 ENCOUNTER — Inpatient Hospital Stay: Payer: Medicare Other

## 2022-10-03 ENCOUNTER — Other Ambulatory Visit: Payer: Medicare Other

## 2022-10-03 ENCOUNTER — Observation Stay (HOSPITAL_BASED_OUTPATIENT_CLINIC_OR_DEPARTMENT_OTHER): Payer: Medicare Other

## 2022-10-03 ENCOUNTER — Inpatient Hospital Stay: Payer: Medicare Other | Admitting: Hematology

## 2022-10-03 ENCOUNTER — Other Ambulatory Visit (HOSPITAL_COMMUNITY): Payer: Self-pay

## 2022-10-03 DIAGNOSIS — I2694 Multiple subsegmental pulmonary emboli without acute cor pulmonale: Secondary | ICD-10-CM | POA: Diagnosis not present

## 2022-10-03 DIAGNOSIS — R0602 Shortness of breath: Secondary | ICD-10-CM | POA: Diagnosis not present

## 2022-10-03 DIAGNOSIS — I2699 Other pulmonary embolism without acute cor pulmonale: Secondary | ICD-10-CM | POA: Diagnosis not present

## 2022-10-03 DIAGNOSIS — Z86711 Personal history of pulmonary embolism: Secondary | ICD-10-CM | POA: Diagnosis not present

## 2022-10-03 DIAGNOSIS — C182 Malignant neoplasm of ascending colon: Secondary | ICD-10-CM

## 2022-10-03 LAB — CBC
HCT: 33 % — ABNORMAL LOW (ref 36.0–46.0)
Hemoglobin: 10.3 g/dL — ABNORMAL LOW (ref 12.0–15.0)
MCH: 32.2 pg (ref 26.0–34.0)
MCHC: 31.2 g/dL (ref 30.0–36.0)
MCV: 103.1 fL — ABNORMAL HIGH (ref 80.0–100.0)
Platelets: 208 10*3/uL (ref 150–400)
RBC: 3.2 MIL/uL — ABNORMAL LOW (ref 3.87–5.11)
RDW: 17.8 % — ABNORMAL HIGH (ref 11.5–15.5)
WBC: 12.7 10*3/uL — ABNORMAL HIGH (ref 4.0–10.5)
nRBC: 0 % (ref 0.0–0.2)

## 2022-10-03 LAB — ECHOCARDIOGRAM COMPLETE
AR max vel: 2.39 cm2
AV Area VTI: 2.42 cm2
AV Area mean vel: 2.28 cm2
AV Mean grad: 8 mmHg
AV Peak grad: 12.2 mmHg
Ao pk vel: 1.75 m/s
Area-P 1/2: 2.48 cm2
Calc EF: 49.8 %
Height: 65 in
MV VTI: 2.46 cm2
S' Lateral: 3.1 cm
Single Plane A2C EF: 49.9 %
Single Plane A4C EF: 49.9 %
Weight: 3430.36 oz

## 2022-10-03 LAB — GLUCOSE, CAPILLARY
Glucose-Capillary: 117 mg/dL — ABNORMAL HIGH (ref 70–99)
Glucose-Capillary: 108 mg/dL — ABNORMAL HIGH (ref 70–99)
Glucose-Capillary: 91 mg/dL (ref 70–99)

## 2022-10-03 LAB — HEPARIN LEVEL (UNFRACTIONATED): Heparin Unfractionated: 0.85 IU/mL — ABNORMAL HIGH (ref 0.30–0.70)

## 2022-10-03 MED ORDER — APIXABAN 5 MG PO TABS: ORAL_TABLET | ORAL | 0 refills | Status: DC

## 2022-10-03 MED ORDER — HEPARIN SOD (PORK) LOCK FLUSH 100 UNIT/ML IV SOLN
500.0000 [IU] | INTRAVENOUS | Status: AC | PRN
  Administered 2022-10-03: 500 [IU]

## 2022-10-03 MED ORDER — APIXABAN 5 MG PO TABS: 5.0000 mg | ORAL_TABLET | Freq: Two times a day (BID) | ORAL | Status: DC

## 2022-10-03 MED ORDER — APIXABAN 5 MG PO TABS
10.0000 mg | ORAL_TABLET | Freq: Two times a day (BID) | ORAL | Status: DC
  Administered 2022-10-03: 10 mg via ORAL
  Filled 2022-10-03: qty 2

## 2022-10-03 NOTE — Progress Notes (Signed)
ANTICOAGULATION CONSULT NOTE - Initial Consult  Pharmacy Consult for Eliquis Indication: pulmonary embolus  Allergies  Allergen Reactions   Oxycodone Nausea And Vomiting    Patient Measurements: Height: 5\' 5"  (165.1 cm) Weight: 97.3 kg (214 lb 6.4 oz) IBW/kg (Calculated) : 57  Vital Signs: Temp: 98.6 F (37 C) (06/03 1029) Temp Source: Oral (06/03 1029) BP: 129/62 (06/03 1029) Pulse Rate: 67 (06/03 1029)  Labs: Recent Labs    10/02/22 1415 10/02/22 2149 10/03/22 0611  HGB 10.7*  --  10.3*  HCT 34.7*  --  33.0*  PLT 206  --  208  HEPARINUNFRC  --  0.71* 0.85*  CREATININE 0.95  --   --   TROPONINIHS 11  --   --     Estimated Creatinine Clearance: 60 mL/min (by C-G formula based on SCr of 0.95 mg/dL).   Medical History: Past Medical History:  Diagnosis Date   Aortic atherosclerosis (HCC)    Arthritis    feet, lower back   Basal cell carcinoma    arm   Breast cancer of upper-outer quadrant of left female breast (HCC) 06/04/2014   Cataract    immature on the left   Colon cancer (HCC) 08/2019   Diabetes mellitus without complication (HCC)    Diverticulosis    Dizziness    > 61yrs ago;took Antivert    Family history of anesthesia complication    sister slow to wake up with anesthesia   Family history of breast cancer    Family history of colon cancer    Family history of uterine cancer    GERD (gastroesophageal reflux disease)    takes occasional TUMs   History of bronchitis    > 11yrs ago   History of colon polyps    History of hiatal hernia    Small noted on CT   History of pulmonary embolus (PE)    Hypertension    takes Losartan daily and HCTZ   Iron deficiency anemia    Joint pain    Numbness    to toes on each foot   Peripheral neuropathy    feet and toes   Personal history of radiation therapy    Pulmonary nodules    Noted on CT   Radiation 07/31/14-08/28/14   Left Breast 20 fxs   Seasonal allergies    takes Claritin prn   Urinary  frequency    Vitamin D deficiency    takes VIt D daily   Assessment: AC/Heme: New PE. IV heparin (h/o PE 2021) - 5/2: Iron panel WNL - Hep level 0.85, Hgb down to 10.3, Plts 208. Rate running correctly. No bleeding noted by RN. Can transition to Eliquis today 6/3  Goal of Therapy:  Therapeutic oral anticoagulation   Plan:  D/c IV heparin 6/3: Eliquis 10mg  BID x 7d then 5mg  BID Copay $45. F/u to resume home meds   Erin Uecker S. Merilynn Finland, PharmD, BCPS Clinical Staff Pharmacist [email or web address] Merilynn Finland, Haisley Arens Stillinger 10/03/2022,12:57 PM

## 2022-10-03 NOTE — TOC Benefit Eligibility Note (Signed)
Patient Advocate Encounter  Insurance verification completed.    The patient is currently admitted and upon discharge could be taking Eliquis 5 mg.  The current 30 day co-pay is $45.00.   The patient is insured through [employer] Part D   This test claim was processed through [facility]- copay amounts may vary at other pharmacies due to pharmacy/plan contracts, or as the patient moves through the different stages of their insurance plan.  Daeveon Zweber, CPHT Pharmacy Patient Advocate Specialist [facility] Patient Advocate Team Direct Number: [phone]  Fax: [phone]       

## 2022-10-03 NOTE — Discharge Instructions (Signed)
Information on my medicine - ELIQUIS (apixaban)  Why was Eliquis prescribed for you? Eliquis was prescribed to treat blood clots that may have been found in the veins of your legs (deep vein thrombosis) or in your lungs (pulmonary embolism) and to reduce the risk of them occurring again.  What do You need to know about Eliquis ? The starting dose is 10 mg (two 5 mg tablets) taken TWICE daily for the FIRST SEVEN (7) DAYS, then on (enter date)  10/10/22  the dose is reduced to ONE 5 mg tablet taken TWICE daily.  Eliquis may be taken with or without food.   Try to take the dose about the same time in the morning and in the evening. If you have difficulty swallowing the tablet whole please discuss with your pharmacist how to take the medication safely.  Take Eliquis exactly as prescribed and DO NOT stop taking Eliquis without talking to the doctor who prescribed the medication.  Stopping may increase your risk of developing a new blood clot.  Refill your prescription before you run out.  After discharge, you should have regular check-up appointments with your healthcare provider that is prescribing your Eliquis.    What do you do if you miss a dose? If a dose of ELIQUIS is not taken at the scheduled time, take it as soon as possible on the same day and twice-daily administration should be resumed. The dose should not be doubled to make up for a missed dose.  Important Safety Information A possible side effect of Eliquis is bleeding. You should call your healthcare provider right away if you experience any of the following: Bleeding from an injury or your nose that does not stop. Unusual colored urine (red or dark brown) or unusual colored stools (red or black). Unusual bruising for unknown reasons. A serious fall or if you hit your head (even if there is no bleeding).  Some medicines may interact with Eliquis and might increase your risk of bleeding or clotting while on Eliquis. To  help avoid this, consult your healthcare provider or pharmacist prior to using any new prescription or non-prescription medications, including herbals, vitamins, non-steroidal anti-inflammatory drugs (NSAIDs) and supplements.  This website has more information on Eliquis (apixaban): http://[email or web address].com/eliquis/home

## 2022-10-03 NOTE — Discharge Summary (Signed)
Physician Discharge Summary   Patient: Kim Castro MRN: [id number] DOB: [birth year]  Admit date:     10/02/2022  Discharge date: 10/03/22  Discharge Physician: Tyrone Nine   PCP: [name], MD   Recommendations at discharge:  Follow up with PCP in 1-2 weeks, consider repeat CBC since initiation of DOAC for PE.  Follow up with oncology per routine.  Discharge Diagnoses: Principal Problem:   Pulmonary embolism (HCC) Active Problems:   Macrocytic anemia   Leukocytosis   Malignant neoplasm of ascending colon (HCC)   Type 2 diabetes mellitus with hyperlipidemia (HCC)   Essential hypertension   Peripheral neuropathy due to chemotherapy (HCC)   Hyperlipidemia   Gastroesophageal reflux disease  HPI: Kim Castro is a 77 y.o. female with medical history significant of hx of breast cancer and colon cancer, T2DM, GERD, hx of PE, HTN, IDA who presented to ED with complaints of blood clot seen on outpatient CT.  She had a CT on 5/28 for restaging of colon cancer and it was interpreted today by radiology with concern for right sided pulmonary emobli. Oncology called her and asked her to come to ED. History of previous PE years ago while on chemo for breast cancer. She denies any shortness of breath, leg swelling or cough. She mowed her yard yesterday and today. She had no shortness of breath,  just some fatigue.    She has been feeling good. Denies any fever/chills, vision changes/headaches, chest pain or palpitations, shortness of breath or cough, abdominal pain, N/V/D, dysuria or leg swelling.    She does not smoke or drink alcohol.    ER Course:  vitals: afebrile, bp: 172/67>103/77, HR: 83, RR: 16, oxygen: 100%RA Pertinent labs: wbc: 12.3, hgb: 10.7, bnp: 123, troponin wnl,  CTA chest: +bilateral pulmonary emboli, R>L. No findings of right heart strain. Diffuse mosaic attenuation bilaterally, which may be secondary to small airways disease. Scattered pulmonary nodules are  unchanged.  In ED: started on heparin gtt. LE DVT study ordered. [facility] asked to admit.   Hospital Course: Remained hemodynamically stable, ambulated 756ft without dyspnea or hypoxia. No bleeding after initiation of anticoagulation. Requests discharge home.   Assessment and Plan: * Pulmonary embolism (HCC) 77 year old female with known colon cancer undergoing chemo who was sent to ED after CT done outpatient showed concerning findings of PE. She is essentially asymptomatic. Repeat CTA chest shows bilateral pulmonary emboli R>L. No findings of right heart strain, but large clot burden.  - Convert heparin to eliquis (confirms this was well tolerated and affordable for 6 months after her last PE 2 years ago), now lifelong is recommended.  -DVT studies negative -troponin wnl, BNP at baseline.  -Echocardiogram shows normal RV size and function   Macrocytic anemia: Due to chemotherapy. Iron panel, folate, B12 all normal/replete.  Leukocytosis Chronically elevated since 08/2022 No signs of infection    Malignant neoplasm of ascending colon (HCC) -Diagnosed in 08/2019 , staging CT chest showed multiple small lung nodules that were nonspecific but likely benign.  -S/p surgery with Dr Maisie Fus on 11/01/19. Path showed overall Stage IIIB cancer.  -S/p 6 months of adjuvant FOLFOX completed 05/2020.  -Due to rising CEA she underwent a PET scan 11/10/2021 which showed hypermetabolism and enlargement to the left ovary, no other evidence of metastatic disease  -S/p total hysterectomy 12/22/2021 by Dr. Pricilla Holm, path revealed metastatic moderately differentiated colonic adenocarcinoma involving both ovaries with peritoneal deposits also positive for metastatic mucinous adenocarcinoma consistent with  colorectal primary -Foundation One showed K-ras mutation, unfortunately she is not eligible for any targeted therapy -She restarted FOLFOX 01/24/2022 - 04/2022, stopped due to disease progression. Started  FOLFIRI/Beva -Receives chemo q 2 weeks -due for infusion, will reschedule  Type 2 diabetes mellitus with hyperlipidemia (HCC) Recent A1C 6.2 Continue home medications  Essential hypertension Blood pressure initially quite elevated with repeat to goal.  Continue cozaar   Peripheral neuropathy due to chemotherapy (HCC) Continue lyrica BID   Hyperlipidemia Continue pravachol 10mg  daily   Gastroesophageal reflux disease Continue PPI   Consultants: None Procedures performed: None  Disposition: Home Diet recommendation: Carb-modified DISCHARGE MEDICATION: Allergies as of 10/03/2022       Reactions   Oxycodone Nausea And Vomiting        Medication List     TAKE these medications    apixaban 5 MG Tabs tablet Commonly known as: ELIQUIS Take 2 tablets (10 mg total) by mouth 2 (two) times daily for 7 days, THEN 1 tablet (5 mg total) 2 (two) times daily for 23 days. Start taking on: October 03, 2022   b complex vitamins capsule Take 1 capsule by mouth daily.   ibuprofen 200 MG tablet Commonly known as: ADVIL Take 200 mg by mouth daily as needed (pain).   loratadine 10 MG tablet Commonly known as: CLARITIN Take 10 mg by mouth daily as needed for allergies.   losartan 25 MG tablet Commonly known as: COZAAR Take 1 tablet (25 mg total) by mouth daily.   metFORMIN 500 MG tablet Commonly known as: GLUCOPHAGE Take 1 tablet (500 mg total) by mouth in the morning and at bedtime.   multivitamin with minerals tablet Take 1 tablet by mouth daily.   omeprazole 20 MG capsule Commonly known as: PRILOSEC Take 20 mg by mouth daily before breakfast.   ondansetron 8 MG tablet Commonly known as: Zofran Take 1 tablet (8 mg total) by mouth every 8 (eight) hours as needed for nausea or vomiting. Start on the third day after chemotherapy.   potassium chloride 10 MEQ tablet Commonly known as: KLOR-CON Take 1 tablet (10 mEq total) by mouth daily. What changed: when to take this    pravastatin 10 MG tablet Commonly known as: PRAVACHOL Take 1 tablet (10 mg total) by mouth daily.   pregabalin 25 MG capsule Commonly known as: LYRICA Take 1 capsule (25 mg total) by mouth 2 (two) times daily.   prochlorperazine 10 MG tablet Commonly known as: COMPAZINE Take 1 tablet (10 mg total) by mouth every 6 (six) hours as needed for nausea or vomiting.   triamcinolone ointment 0.5 % Commonly known as: KENALOG Apply 1 Application topically 2 (two) times daily.   Vitamin D 50 MCG (2000 UT) Caps Take 2,000 Units by mouth daily.        Follow-up Information     Rudd, Bertram Millard, MD Follow up.   Specialty: Family Medicine Contact information: [address] [place] [address] [phone]         [name], MD Follow up.   Specialties: Hematology, Oncology Contact information: [address] [place] [address] [phone]                Discharge Exam: Ceasar Mons Weights   10/02/22 1230  Weight: 97.3 kg  BP (!) 140/52 (BP Location: Right Arm)   Pulse 70   Temp 98.8 F (37.1 C) (Oral)   Resp 15   Ht 5\' 5"  (1.651 m)   Wt 97.3  kg   SpO2 98%   BMI 35.68 kg/m   Well-appearing active female WDWN in no distress Clear, nonlabored RRR, no MRG or edema  Condition at discharge: stable  The results of significant diagnostics from this hospitalization (including imaging, microbiology, ancillary and laboratory) are listed below for reference.   Imaging Studies: VAS [place] LOWER EXTREMITY VENOUS (DVT) (ONLY [facility] & [facility])  Result Date: 10/03/2022  Lower Venous DVT Study Patient Name:  Kim Castro [facility]  Date of Exam:   10/03/2022 Medical Rec #: [id number]           Accession #:    [id number] Date of Birth: [birth year]           Patient Gender: F Patient Age:   10 years Exam Location:  [facility] Procedure:      VAS [place] LOWER EXTREMITY VENOUS (DVT) Referring Phys: MATTHEW TRIFAN  --------------------------------------------------------------------------------  Indications: Pulmonary embolism.  Risk Factors: Cancer on chemotherapy. Comparison Study: 03-21-2022 Prior left lower extremity venous study was                   negative for DVT. Performing Technologist: Jean Rosenthal RDMS, RVT  Examination Guidelines: A complete evaluation includes B-mode imaging, spectral Doppler, color Doppler, and power Doppler as needed of all accessible portions of each vessel. Bilateral testing is considered an integral part of a complete examination. Limited examinations for reoccurring indications may be performed as noted. The reflux portion of the exam is performed with the patient in reverse Trendelenburg.  +---------+---------------+---------+-----------+----------+--------------+ RIGHT    CompressibilityPhasicitySpontaneityPropertiesThrombus Aging +---------+---------------+---------+-----------+----------+--------------+ CFV      Full           Yes      Yes                                 +---------+---------------+---------+-----------+----------+--------------+ SFJ      Full                                                        +---------+---------------+---------+-----------+----------+--------------+ FV Prox  Full                                                        +---------+---------------+---------+-----------+----------+--------------+ FV Mid   Full                                                        +---------+---------------+---------+-----------+----------+--------------+ FV DistalFull                                                        +---------+---------------+---------+-----------+----------+--------------+ PFV      Full                                                        +---------+---------------+---------+-----------+----------+--------------+  POP      Full           Yes      Yes                                  +---------+---------------+---------+-----------+----------+--------------+ PTV      Full                                                        +---------+---------------+---------+-----------+----------+--------------+ PERO     Full                                                        +---------+---------------+---------+-----------+----------+--------------+   +---------+---------------+---------+-----------+----------+--------------+ LEFT     CompressibilityPhasicitySpontaneityPropertiesThrombus Aging +---------+---------------+---------+-----------+----------+--------------+ CFV      Full           Yes      Yes                                 +---------+---------------+---------+-----------+----------+--------------+ SFJ      Full                                                        +---------+---------------+---------+-----------+----------+--------------+ FV Prox  Full                                                        +---------+---------------+---------+-----------+----------+--------------+ FV Mid   Full                                                        +---------+---------------+---------+-----------+----------+--------------+ FV DistalFull                                                        +---------+---------------+---------+-----------+----------+--------------+ PFV      Full                                                        +---------+---------------+---------+-----------+----------+--------------+ POP      Full           Yes      Yes                                 +---------+---------------+---------+-----------+----------+--------------+  PTV      Full                                                        +---------+---------------+---------+-----------+----------+--------------+ PERO     Full                                                         +---------+---------------+---------+-----------+----------+--------------+     Summary: RIGHT: - There is no evidence of deep vein thrombosis in the lower extremity.  - No cystic structure found in the popliteal fossa.  LEFT: - There is no evidence of deep vein thrombosis in the lower extremity.  - No cystic structure found in the popliteal fossa.  *See table(s) above for measurements and observations. Electronically signed by Lemar Livings MD on 10/03/2022 at 4:39:27 PM.    Final    ECHOCARDIOGRAM COMPLETE  Result Date: 10/03/2022    ECHOCARDIOGRAM REPORT   Patient Name:   Kim Castro [facility] Date of Exam: 10/03/2022 Medical Rec #:  [id number]          Height:       65.0 in Accession #:    [id number]         Weight:       214.4 lb Date of Birth:  12/22/1945          BSA:          2.037 m Patient Age:    76 years           BP:           140/52 mmHg Patient Gender: F                  HR:           66 bpm. Exam Location:  Inpatient Procedure: 2D Echo, Cardiac Doppler and Color Doppler Indications:    Pulmonary Embolus  History:        Patient has prior history of Echocardiogram examinations, most                 recent 01/27/2020. Cancer, Signs/Symptoms:Fatigue; Risk                 Factors:Diabetes, Dyslipidemia and Hypertension.  Sonographer:    Wallie Char Referring Phys: [id number] Tyrone Nine  Sonographer Comments: Technically difficult study due to poor echo windows. IMPRESSIONS  1. Left ventricular ejection fraction, by estimation, is 55 to 60%. The left ventricle has normal function. The left ventricle has no regional wall motion abnormalities. Left ventricular diastolic parameters were normal.  2. Right ventricular systolic function is normal. The right ventricular size is normal.  3. The mitral valve is normal in structure. No evidence of mitral valve regurgitation. No evidence of mitral stenosis.  4. The aortic valve is normal in structure. There is mild calcification of the aortic valve. Aortic valve  regurgitation is not visualized. No aortic stenosis is present.  5. The inferior vena cava is normal in size with greater than 50% respiratory variability, suggesting right atrial pressure of 3 mmHg. FINDINGS  Left Ventricle: Left ventricular ejection fraction, by estimation, is 55 to 60%. The  left ventricle has normal function. The left ventricle has no regional wall motion abnormalities. The left ventricular internal cavity size was normal in size. There is  no left ventricular hypertrophy. Left ventricular diastolic parameters were normal. Right Ventricle: The right ventricular size is normal. No increase in right ventricular wall thickness. Right ventricular systolic function is normal. Left Atrium: Left atrial size was normal in size. Right Atrium: Right atrial size was normal in size. Pericardium: There is no evidence of pericardial effusion. Mitral Valve: The mitral valve is normal in structure. No evidence of mitral valve regurgitation. No evidence of mitral valve stenosis. MV peak gradient, 8.2 mmHg. The mean mitral valve gradient is 3.0 mmHg. Tricuspid Valve: The tricuspid valve is normal in structure. Tricuspid valve regurgitation is not demonstrated. No evidence of tricuspid stenosis. Aortic Valve: The aortic valve is normal in structure. There is mild calcification of the aortic valve. Aortic valve regurgitation is not visualized. No aortic stenosis is present. Aortic valve mean gradient measures 8.0 mmHg. Aortic valve peak gradient measures 12.2 mmHg. Aortic valve area, by VTI measures 2.42 cm. Pulmonic Valve: The pulmonic valve was normal in structure. Pulmonic valve regurgitation is not visualized. No evidence of pulmonic stenosis. Aorta: The aortic root is normal in size and structure. Venous: The inferior vena cava is normal in size with greater than 50% respiratory variability, suggesting right atrial pressure of 3 mmHg. IAS/Shunts: No atrial level shunt detected by color flow Doppler.  LEFT  VENTRICLE PLAX 2D LVIDd:         4.30 cm     Diastology LVIDs:         3.10 cm     LV e' medial:    4.87 cm/s LV PW:         1.10 cm     LV E/e' medial:  18.8 LV IVS:        0.90 cm     LV e' lateral:   5.34 cm/s LVOT diam:     2.10 cm     LV E/e' lateral: 17.1 LV SV:         96 LV SV Index:   47 LVOT Area:     3.46 cm  LV Volumes (MOD) LV vol d, MOD A2C: 78.6 ml LV vol d, MOD A4C: 86.0 ml LV vol s, MOD A2C: 39.4 ml LV vol s, MOD A4C: 43.1 ml LV SV MOD A2C:     39.2 ml LV SV MOD A4C:     86.0 ml LV SV MOD BP:      41.8 ml RIGHT VENTRICLE             IVC RV S prime:     15.80 cm/s  IVC diam: 1.80 cm TAPSE (M-mode): 1.9 cm LEFT ATRIUM             Index        RIGHT ATRIUM           Index LA diam:        4.10 cm 2.01 cm/m   RA Area:     13.90 cm LA Vol (A2C):   41.3 ml 20.27 ml/m  RA Volume:   32.50 ml  15.95 ml/m LA Vol (A4C):   37.8 ml 18.55 ml/m LA Biplane Vol: 42.3 ml 20.76 ml/m  AORTIC VALVE AV Area (Vmax):    2.39 cm AV Area (Vmean):   2.28 cm AV Area (VTI):     2.42 cm AV Vmax:  174.50 cm/s AV Vmean:          132.500 cm/s AV VTI:            0.396 m AV Peak Grad:      12.2 mmHg AV Mean Grad:      8.0 mmHg LVOT Vmax:         120.50 cm/s LVOT Vmean:        87.100 cm/s LVOT VTI:          0.277 m LVOT/AV VTI ratio: 0.70  AORTA Ao Root diam: 3.30 cm Ao Asc diam:  3.20 cm MITRAL VALVE                TRICUSPID VALVE MV Area (PHT): 2.48 cm     TR Peak grad:   26.6 mmHg MV Area VTI:   2.46 cm     TR Vmax:        258.00 cm/s MV Peak grad:  8.2 mmHg MV Mean grad:  3.0 mmHg     SHUNTS MV Vmax:       1.43 m/s     Systemic VTI:  0.28 m MV Vmean:      74.8 cm/s    Systemic Diam: 2.10 cm MV Decel Time: 306 msec MV E velocity: 91.50 cm/s MV A velocity: 135.00 cm/s MV E/A ratio:  0.68 Aditya Sabharwal Electronically signed by Dorthula Nettles Signature Date/Time: 10/03/2022/3:39:04 PM    Final    CT Angio Chest PE W and/or Wo Contrast  Result Date: 10/02/2022 CLINICAL DATA:  PE detected on outpatient CT.  History of metastatic colon cancer EXAM: CT ANGIOGRAPHY CHEST WITH CONTRAST TECHNIQUE: Multidetector CT imaging of the chest was performed using the standard protocol during bolus administration of intravenous contrast. Multiplanar CT image reconstructions and MIPs were obtained to evaluate the vascular anatomy. RADIATION DOSE REDUCTION: This exam was performed according to the departmental dose-optimization program which includes automated exposure control, adjustment of the mA and/or kV according to patient size and/or use of iterative reconstruction technique. CONTRAST:  OMNIPAQUE IOHEXOL 350 MG/ML SOLN COMPARISON:  CT 09/27/2022. FINDINGS: Cardiovascular: Satisfactory opacification of the pulmonary arteries. Filling defect within the distal right main pulmonary artery extending into all lobar branches as well as numerous segmental and subsegmental branch arteries of the right upper, right middle, and right lower lobes. No saddle embolism. Smaller burden of left-sided pulmonary emboli with involvement of segmental branch arteries of the left upper and left lower lobes. RV to LV ratio of 0.77 without findings to suggest right heart strain. Thoracic aorta is nonaneurysmal. Scattered atherosclerotic calcifications of the aorta. Heart size is upper limits of normal. No pericardial effusion. Right IJ approach chest port remains in place. Mediastinum/Nodes: Similar subcentimeter mediastinal lymph nodes. No axillary or hilar lymphadenopathy. Trachea and esophagus within normal limits. Lungs/Pleura: Unchanged subpleural radiation fibrosis of the anterior left lung. Diffuse mosaic attenuation bilaterally. Scattered pulmonary nodules are unchanged, largest within the left lower lobe measuring 5 mm (series 12, image 57). No new airspace consolidation. No pleural effusion or pneumothorax. Upper Abdomen: No new or acute abnormality within the included upper abdomen. See recently obtained dedicated CT abdomen pelvis.  Musculoskeletal: Postsurgical changes within the left breast. No new or acute bony or chest wall findings. Review of the MIP images confirms the above findings. IMPRESSION: 1. Positive for bilateral pulmonary emboli, right greater than left, as described above. No saddle embolism. No findings to suggest right heart strain. 2. Diffuse mosaic attenuation bilaterally, which may be  secondary to small airways disease. 3. Scattered pulmonary nodules are unchanged, largest within the left lower lobe measuring 5 mm. Continued surveillance per oncology protocols 4. Aortic atherosclerosis (ICD10-I70.0). Electronically Signed   By: Duanne Guess D.O.   On: 10/02/2022 16:06   CT CHEST ABDOMEN PELVIS W CONTRAST  Result Date: 10/02/2022 CLINICAL DATA:  Restaging colon cancer.  * Tracking Code: BO * EXAM: CT CHEST, ABDOMEN, AND PELVIS WITH CONTRAST TECHNIQUE: Multidetector CT imaging of the chest, abdomen and pelvis was performed following the standard protocol during bolus administration of intravenous contrast. RADIATION DOSE REDUCTION: This exam was performed according to the departmental dose-optimization program which includes automated exposure control, adjustment of the mA and/or kV according to patient size and/or use of iterative reconstruction technique. CONTRAST:  OMNIPAQUE IOHEXOL 300 MG/ML  SOLN COMPARISON:  06/23/2022 CT scan and older.  MRI abdomen 04/21/2022 FINDINGS: CT CHEST FINDINGS Cardiovascular: Right upper chest port with tip along the central SVC. Heart is nonenlarged. No pericardial effusion. Coronary artery calcifications are seen. The thoracic aorta has a normal course and caliber with some mild calcified atherosclerotic plaque. Note is made of filling defect involving the right pulmonary artery at the hilum consistent with a new pulmonary embolism. Example series 2, image 31. Mediastinum/Nodes: Heterogeneous thyroid gland with stable small bilateral nodules. No specific abnormal lymph node  enlargement identified in the axillary region, hilum or mediastinum. There are several small less than 1 cm size in short axis mediastinal nodes which are not pathologic by size criteria. These were seen previously. Lungs/Pleura: No consolidation, pneumothorax or effusion. Stable interstitial septal thickening and fibrotic changes along the anterior aspect of the left upper lobe. Please correlate with any specific history including radiation. There are surgical clips in the left breast. Few small lung nodules are identified once again. This includes a 5 mm nodule left lower lobe on series 4, image 73 which is stable. 3 mm nodule right lower lobe on series 4, image 100 is also stable. 3 mm nodule right upper lobe series 4, image 66 is also stable. No new lung nodules. No pleural effusion or pneumothorax. Musculoskeletal: Moderate degenerative changes of the thoracic spine. Multilevel disc height loss, osteophytes and sclerosis. CT ABDOMEN PELVIS FINDINGS Hepatobiliary: Gallbladder is nondilated. Patent portal vein. Subtle lesion along the inferior aspect of the right hepatic lobe is once again identified. Previously measuring 10 x 8 mm and today on series 2, image 81 7 x 5 mm. No additional liver lesions identified at this time. Pancreas: Unremarkable. No pancreatic ductal dilatation or surrounding inflammatory changes. Spleen: Tiny low-attenuation lesion seen along the superior aspect of the spleen measures 7 mm. This is stable. Too small to completely characterize and likely benign recommend attention on follow-up. Adrenals/Urinary Tract: Adrenal glands are preserved. Upper pole Bosniak 1 renal cysts once again identified measuring 2.5 cm with Hounsfield units of 3. Parapelvic left-sided renal cysts are identified. No enhancing renal mass or collecting system dilatation. Preserved contours of the urinary bladder. Stomach/Bowel: Stomach and small bowel are nondilated. Oral contrast was administered. Large bowel has  some scattered left-sided colonic stool with some sigmoid colon diverticula. Surgical changes in the right lower quadrant from colonic resection and primary anastomosis. Vascular/Lymphatic: No significant vascular findings are present. No enlarged abdominal or pelvic lymph nodes. Reproductive: Status post hysterectomy. No adnexal masses. Small rounded structure along the right side of the vaginal wall likely a Bartholin's gland cyst. Other: No free air or free fluid. Slight protuberance of the  anterior abdominal wall in the midline. On prior CT there is some small peritoneal nodules suggested. These are faintly seen today on series 2, image 81 and 82. Musculoskeletal: Moderate degenerative changes of the spine and pelvis. IMPRESSION: Slight decrease in size of right hepatic lobe liver lesion. No new liver lesions identified. Stable tiny lung nodules and some subtle peritoneal nodules. No new mass lesion, fluid collection or lymph node enlargement. Development of right-sided pulmonary emboli including central emboli at the main right pulmonary artery and lobar vessels. Extent and distribution of the emboli are incompletely evaluated the standard CT technique. Follow up CT pulmonary angiogram as clinically appropriate Critical Value/emergent results were called by telephone at the time of interpretation on 10/02/2022 at 8:35 am to provider Dr. Candise Che, Who verbally acknowledged these results. Electronically Signed   By: Karen Kays M.D.   On: 10/02/2022 11:39    Microbiology: Results for orders placed or performed during the hospital encounter of 12/22/21  Urine Culture     Status: None   Collection Time: 12/22/21  5:56 PM   Specimen: Urine, Clean Catch  Result Value Ref Range Status   Specimen Description   Final    URINE, CLEAN CATCH Performed at [facility], [address] [place], [place] [address]    Special Requests   Final    NONE Performed at [facility], [address] [place], [place] [address]    Culture   Final    NO GROWTH Performed at [facility], [address], [place], [place] [address]    Report Status 12/23/2021 FINAL  Final    Labs: CBC: Recent Labs  Lab 10/02/22 1415 10/03/22 0611  WBC 12.3* 12.7*  NEUTROABS 9.1*  --   HGB 10.7* 10.3*  HCT 34.7* 33.0*  MCV 102.1* 103.1*  PLT 206 208   Basic Metabolic Panel: Recent Labs  Lab 10/02/22 1415  NA 141  K 4.3  CL 108  CO2 25  GLUCOSE 111*  BUN 19  CREATININE 0.95  CALCIUM 9.4   Liver Function Tests: Recent Labs  Lab 10/02/22 1415  AST 29  ALT 25  ALKPHOS 172*  BILITOT 1.0  PROT 6.8  ALBUMIN 3.7   CBG: Recent Labs  Lab 10/02/22 2102 10/03/22 0750 10/03/22 1159 10/03/22 1655  GLUCAP 138* 117* 108* 91    Discharge time spent: greater than 30 minutes.  Signed: Tyrone Nine, MD [facility] 10/03/2022

## 2022-10-03 NOTE — TOC Initial Note (Signed)
Transition of Care [facility]) - Initial/Assessment Note    Patient Details  Name: Kim Castro MRN: [id number] Date of Birth: 04/19/1946  Transition of Care [facility]) CM/SW Contact:    Larrie Kass, LCSW Phone Number: 10/03/2022, 10:38 AM  Clinical Narrative:                 CSW received a consult for medication assistance, if appropriate. TOC will continue to follow.   Expected Discharge Plan: Home/Self Care Barriers to Discharge: Continued Medical Work up   Patient Goals and CMS Choice            Expected Discharge Plan and Services       Living arrangements for the past 2 months: Single Family Home                                      Prior Living Arrangements/Services Living arrangements for the past 2 months: Single Family Home Lives with:: Self, Spouse Patient language and need for interpreter reviewed:: Yes        Need for Family Participation in Patient Care: No (Comment) Care giver support system in place?: No (comment)      Activities of Daily Living Home Assistive Devices/Equipment: Cane (specify quad or straight) ADL Screening (condition at time of admission) Patient's cognitive ability adequate to safely complete daily activities?: Yes Is the patient deaf or have difficulty hearing?: No Does the patient have difficulty seeing, even when wearing glasses/contacts?: No Does the patient have difficulty concentrating, remembering, or making decisions?: No Patient able to express need for assistance with ADLs?: Yes Does the patient have difficulty dressing or bathing?: No Independently performs ADLs?: Yes (appropriate for developmental age) Does the patient have difficulty walking or climbing stairs?: No Weakness of Legs: None Weakness of Arms/Hands: None  Permission Sought/Granted                  Emotional Assessment              Admission diagnosis:  Pulmonary embolism (HCC) [I26.99] PE (pulmonary thromboembolism)  (HCC) [I26.99] Patient Active Problem List   Diagnosis Date Noted   Pulmonary embolism (HCC) 10/02/2022   Macrocytic anemia 10/02/2022   Leukocytosis 10/02/2022   DNR (do not resuscitate) 08/21/2022   Metastasis to peritoneal cavity (HCC) 01/12/2022   Nausea without vomiting 12/23/2021   Thickened endometrium    Essential hypertension 05/12/2021   Vitamin D deficiency 05/12/2021   Personal history of malignant neoplasm of breast 05/12/2021   Personal history of colonic polyps 05/12/2021   Morbid obesity (HCC) 05/12/2021   Hyperlipidemia 05/12/2021   Hypercalcemia 05/12/2021   Allergic rhinitis 05/12/2021   Knee joint replacement status, bilateral 05/12/2021   Peripheral neuropathy due to chemotherapy (HCC) 05/12/2021   Aortic atherosclerosis (HCC) 05/12/2021   Gastroesophageal reflux disease    Type 2 diabetes mellitus with hyperlipidemia (HCC)    History of pulmonary embolism 01/27/2020   Port-A-Cath in place 12/30/2019   Malignant neoplasm of ascending colon (HCC) 11/01/2019   Genetic testing 07/08/2014   Malignant neoplasm of female breast (HCC) 06/04/2014   PCP:  [name], MD Pharmacy:   [facility] [address] [place] - [address] [place] [address] Phone: [phone] Fax: [phone]  [facility] [address] [place] [address] Phone: [phone] Fax: [phone]  Social Determinants of Health (SDOH) Social History: SDOH Screenings   Food Insecurity: No Food Insecurity (10/02/2022)  Housing: Low Risk  (10/02/2022)  Transportation Needs: No Transportation Needs (10/02/2022)  Utilities: Not At Risk (10/02/2022)  Alcohol Screen: Low Risk  (09/01/2021)  Depression (PHQ2-9): Low Risk  (09/01/2021)  Financial Resource Strain: Low Risk  (09/01/2021)  Physical Activity: Insufficiently Active (09/01/2021)  Social Connections: Moderately Isolated (09/01/2021)  Stress: No Stress  Concern Present (09/01/2021)  Tobacco Use: Low Risk  (10/02/2022)   SDOH Interventions:     Readmission Risk Interventions     No data to display

## 2022-10-03 NOTE — Progress Notes (Deleted)
[facility]   [phone] [phone]   Clinic Follow up Note   Patient Care Team: [name], MD as PCP - General (Family Medicine) [name], MD as Consulting Physician (General Surgery) [name], MD as Consulting Physician (Hematology) [name], MD as Consulting Physician (General Surgery) [name], MD as Consulting Physician (Hematology and Oncology)  Date of Service:  10/03/2022  CHIEF COMPLAINT: f/u of metastatic colon cancer    CURRENT THERAPY: FOLFIRI and Beva q14d      ASSESSMENT: Kim Castro is a 77 y.o. female with    Malignant neoplasm of ascending colon (HCC) pT3N2aM0 stage IIB, MSS, metastatic to b/l ovaries and peritoneum and liver, KRAS mutation G12V (+)  -Initially diagnosed in 08/2019, s/p resection with Dr Maisie Fus on 11/01/19. Path showed overall Stage IIIB cancer.  -s/p 6 months adjuvant FOLFOX, completed 06/01/20, oxaliplatin held for final 2 cycles due to neuropathy.  -s/p BSO and hysterectomy on 12/22/21 with Dr. Pricilla Holm. Path revealed metastatic moderately differentiated colonic adenocarcinoma involving both ovaries and peritoneum -she restarted FOLFOX on 01/24/22. -Due to disease progression, chemotherapy changed to second line FOLFIRI and bevacizumab on April 06, 2022. She has been tolerating moderately well, better now with dose reduction  -Restaging CT scan from June 23, 2022 showed partial response in liver and peritoneal metastasis, no other new lesions.  I reviewed the images and discussed the results with patient.  She is pleased. -She has been tolerating chemotherapy much better lately due to the dose reduction.  We discussed maintenance therapy option in future    Malignant neoplasm of female breast [facility]) -Dx in 05/2014, s/p left lumpectomy with Dr Johna Sheriff, adjuvant RT with Dr [name], and Anastrozole 08/2014 - 01/13/20 under Dr Pamelia Hoit.  -DEXA 09/11/20 T score +1.2 normal.  -most recent  mammogram on 10/04/21 was negative. Will continue yearly   DNR (do not resuscitate) -The patient understands the goal of care is palliative.  -pt has agreed with DNR, order placed.      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  Overall stable.        PLAN:    SUMMARY OF ONCOLOGIC HISTORY: Oncology History Overview Note   Cancer Staging  Malignant neoplasm of female breast [facility]) Staging form: Breast, AJCC 7th Edition - Clinical stage from 06/11/2014: Stage 0 (Tis (DCIS), N0, M0) - Unsigned Staged by: Pathologist and managing physician Laterality: Left Estrogen receptor status: Positive Progesterone receptor status: Positive Stage used in treatment planning: Yes National guidelines used in treatment planning: Yes Type of national guideline used in treatment planning: NCCN - Pathologic stage from 07/03/2014: Stage Unknown (Tis (DCIS), NX, cM0) - Signed by [name], MD on 07/10/2014 Staged by: Pathologist Laterality: Left Estrogen receptor status: Positive Progesterone receptor status: Positive Stage used in treatment planning: Yes National guidelines used in treatment planning: Yes Type of national guideline used in treatment planning: NCCN Staging comments: Staged on final lumpectomy specimen by Dr. [name].  Right colon cancer Staging form: Colon and Rectum, AJCC 8th Edition - Pathologic stage from 11/01/2019: Stage IIIB (pT3, pN2a, cM0) - Signed by [name], MD on 11/29/2019 Stage prefix: Initial diagnosis Histologic grading system: 4 grade system Histologic grade (G): G2 Residual tumor (R): R0 - None Tumor deposits (TD): Absent Perineural invasion (PNI): Absent Microsatellite instability (MSI): Stable KRAS mutation: Unknown NRAS mutation: Unknown BRAF mutation: Unknown     Malignant neoplasm of  female breast (HCC)  05/29/2014 Initial Biopsy   Left breast needle core biopsy: Grade  2, DCIS with calcs. ER+ (100%), PR+ (96%).    06/04/2014 Initial Diagnosis   Left breast DCIS with calcifications, ER 100%, PR 96%   06/10/2014 Breast MRI   Left breast: 2.4 x 1.3 x 1.1 cm area of patchy non-mass enhancement upper outer quadrant includes postbiopsy seroma; Right breast: 1.2 cm previously biopsied stable benign fibroadenoma   06/12/2014 Procedure   Genetic counseling/testing: Identified 1 VUS on CHEK2 gene. Remainder of 17 gene panel tested negative and included: ATM, BARD1, BRCA 1/2, BRIP1, CDH1, CHEK2, EPCAM, MLH1, MSH2, MSH6, NBN, NF1, PALB2, PTEN, RAD50, RAD51C, RAD51D, STK11, and TP53.    07/01/2014 Surgery   Left breast lumpectomy ([facility]): Grade 1, DCIS, spanning 2.3 cm, 1 mm margin, ER 100%, PR 96%   07/31/2014 - 08/28/2014 Radiation Therapy   Adjuvant RT completed [name]). Left breast: Total dose 42.5 Gy over 17 fractions. Left breast boost: Total dose 7.5 Gy over 3 fractions.    09/14/2014 - 01/13/2020 Anti-estrogen oral therapy   Anastrazole 1mg  daily. Planned duration of treatment: 5 years [place]). Completed in 01/2020.    09/25/2014 Survivorship   Survivorship Care Plan given to patient and reviewed with her in person.    03/02/2021 Imaging   CT CAP  IMPRESSION: 1. No findings of active/recurrent malignancy. Partial right hemicolectomy. 2. Endometrial stripe remains mildly thickened, but endometrial biopsy in May was negative for malignancy. 3. Progressive endplate sclerosis and endplate irregularity at T2-3, probably due to degenerative endplate findings. If the has referable upper thoracic pain/symptoms then thoracic spine MRI could be used for further workup. 4. Other imaging findings of potential clinical significance: Mild cardiomegaly. Aortic Atherosclerosis (ICD10-I70.0). Mild mitral valve calcification. Postoperative findings in the left breast with adjacent radiation port anteriorly in the left upper lobe. Tiny pulmonary nodules in the left lower  lobe are unchanged from earliest available comparison of 10/17/2019 and probably benign although may merit surveillance. Multilevel lumbar impingement. Mild pelvic floor laxity.   Malignant neoplasm of ascending colon (HCC)  09/19/2019 Imaging   CT AP W contrast 09/19/19  IMPRESSION Fullness in the cecum, cannot exclude a mass. No evidence for metastatic disease is identified.    09/24/2019 Procedure   Colonoscopy by Dr [name] 09/24/19 IMPRESSION 1. The colon was redundant  2. Mild diverticulosis was noted through the entire examined colon 3. Single 12mm polyp was found in the ascending colon; polypectomy was performed using snare cautery and biopsy forceps 4. Mild diverticulosis was notes in the descending colon and sigmoid colon.  5. Single polyp was found in the sigmoid colon, polypectomy was performed with cold forceps.  6. Single polyp was found in the rectosigmoid colon; polypectomy was performed with cold snare  7. Small internal hemorrhoids  8. Large mass was found at the cecum; multiple biopsies of the area were performed using cold forceps; injection (tattooing) was performed distal to the mass.    09/24/2019 Initial Biopsy   INTERPRETATION AND DIAGNOSIS:  A. Cecum, biopsy:  Invasive moderately differentiated adenocarcinoma.  see comment  B. Polyp @ ascending colon, polypectomy:  Tubular Adenoma  C. Polyp @ sigmoid colon Polypectomy:  hyperplastic polyp.  D. Polyp @ rectosigmoid colon, Polypectomy:  Hyperplastic Polyp      10/16/2019 Imaging   CT Chest IMPRESSION: 1. Multiple small pulmonary nodules measuring 5 mm or less in size in the lungs. These are nonspecific and are typically considered statistically likely benign. However,  given the patient's history of primary malignancy, close attention on follow-up studies is recommended to ensure stability. 2. Aortic atherosclerosis, in addition to right coronary artery disease. Assessment for potential risk factor  modification, dietary therapy or pharmacologic therapy may be warranted, if clinically indicated. 3. There are calcifications of the aortic valve and mitral annulus. Echocardiographic correlation for evaluation of potential valvular dysfunction may be warranted if clinically indicated. 4. Small hiatal hernia.   Aortic Atherosclerosis (ICD10-I70.0).   11/01/2019 Initial Diagnosis   Colon cancer (HCC)   11/01/2019 Surgery   LAPAROSCOPIC PARTIAL COLECTOMY by Dr Maisie Fus and Dr Michaell Cowing   11/01/2019 Pathology Results   FINAL MICROSCOPIC DIAGNOSIS:   A. COLON, PROXIMAL RIGHT, COLECTOMY:  - Invasive colonic adenocarcinoma, 5 cm.  - Tumor invades through the muscularis propria into pericolonic tissues.   - Margins of resection are not involved.  - Metastatic carcinoma in (5) of (13) lymph nodes.  - See oncology table.    MSI Stable  Mismatch repair normal  MLH1 - Preserved nuclear expression (greater 50% tumor expression) MSH2 - Preserved nuclear expression (greater 50% tumor expression) MSH6 - Preserved nuclear expression (greater 50% tumor expression) PMS2 - Preserved nuclear expression (greater 50% tumor expression)   11/01/2019 Cancer Staging   Staging form: Colon and Rectum, AJCC 8th Edition - Pathologic stage from 11/01/2019: Stage IIIB (pT3, pN2a, cM0) - Signed by [name], MD on 11/29/2019   12/10/2019 Procedure   PAC placed 12/10/19   12/17/2019 - 06/01/2020 Chemotherapy   FOLFOX q2weeks starting in 2 weeks starting 12/17/19. Held 01/27/20-02/10/20 due to b/l PE. Oxaliplatin held C11-12 due to neuropathy. Completed on 06/01/20   03/02/2021 Imaging   CT CAP  IMPRESSION: 1. No findings of active/recurrent malignancy. Partial right hemicolectomy. 2. Endometrial stripe remains mildly thickened, but endometrial biopsy in May was negative for malignancy. 3. Progressive endplate sclerosis and endplate irregularity at T2-3, probably due to degenerative endplate findings. If the has  referable upper thoracic pain/symptoms then thoracic spine MRI could be used for further workup. 4. Other imaging findings of potential clinical significance: Mild cardiomegaly. Aortic Atherosclerosis (ICD10-I70.0). Mild mitral valve calcification. Postoperative findings in the left breast with adjacent radiation port anteriorly in the left upper lobe. Tiny pulmonary nodules in the left lower lobe are unchanged from earliest available comparison of 10/17/2019 and probably benign although may merit surveillance. Multilevel lumbar impingement. Mild pelvic floor laxity.   08/26/2021 Imaging   EXAM: CT CHEST, ABDOMEN, AND PELVIS WITH CONTRAST  IMPRESSION: 1. Stable examination without new or progressive findings to suggest local recurrence or metastatic disease within the chest, abdomen, or pelvis. 2. Hepatomegaly with hepatic steatosis. 3. Sigmoid colonic diverticulosis without findings of acute diverticulitis. 4. Similar prominent endplate sclerosis and irregularity at T2-T3 is most consistent with Modic type endplate degenerative changes. However, if patient has referable upper thoracic pain consider further workup with thoracic spine MRI. 5. Similar thickening of the endometrial stripe measuring up to 8 mm, which was previously biopsied with results negative for malignancy. 6. Aortic Atherosclerosis (ICD10-I70.0).   11/10/2021 PET scan   IMPRESSION: 1. LEFT ovary is increased in size and is intensely hypermetabolic. While physiologic hypermetabolic ovarian tissue is not uncommon, the enlargement and asymmetric activity warrants further evaluation. Consider contrast pelvic MRI vs tissue sampling. 2. No evidence of metastatic colorectal carcinoma otherwise. 3. Post RIGHT hemicolectomy anatomy. 4. Evidence of radiation change in the LEFT upper lobe (remote breast cancer).     12/22/2021 Relapse/Recurrence    FINAL MICROSCOPIC  DIAGNOSIS:   A. LEFT OVARY AND FALLOPIAN TUBE, SALPINGO  OOPHORECTOMY:  - Metastatic moderately differentiated colonic adenocarcinoma involving left ovary  - Focal ovarian stromal calcification  - Segment of benign fallopian tube   B. UTERUS WITH RIGHT FALLOPIAN TUBE AND OVARY, HYSTERECTOMY AND SALPINGO-OOPHORECTOMY:  - Metastatic moderately differentiated colonic adenocarcinoma involving right ovary  - Focal invasive extensive adenomyosis  - Benign endometrial polyps  - Benign proliferative phase endometrium  - Hydrosalpinx of right fallopian tube  - Focal ovarian stromal calcification   C. PERITONEAL DEPOSITS, ANTERIOR CUL DE SAC, BIOPSY:  - Metastatic mucinous adenocarcinoma, consistent with colorectal primary    COMMENT:  Immunohistochemical stains show that the tumor cells are positive for CK20 and CDX2 while they are negative for CK7 and PAX8, consistent with above interpretation.    01/24/2022 - 03/23/2022 Chemotherapy   Patient is on Treatment Plan : COLORECTAL FOLFOX q14d x 3 months     04/01/2022 Imaging   CT AP IMPRESSION: 1. New omental metastatic disease. 2. Vague hypoattenuating lesion in the inferior right hepatic lobe is new and also worrisome for metastatic disease. 3. Similar small right lower lobe nodules. Recommend continued attention on follow-up. 4. Steatotic enlarged liver. 5.  Aortic atherosclerosis (ICD10-I70.0).   04/06/2022 -  Chemotherapy   Patient is on Treatment Plan : COLORECTAL FOLFIRI + Bevacizumab q14d     04/21/2022 Imaging    IMPRESSION: 1. Stable chest CT. No evidence of metastatic disease. 2. Stable small pulmonary nodules, considered benign based on stability. 3. Stable postsurgical changes in the left breast and anterior left upper lobe radiation changes. 4. Aortic Atherosclerosis (ICD10-I70.0) and Emphysema (ICD10-J43.9).   04/21/2022 Imaging    IMPRESSION: 1. Small enhancing lesion inferiorly in the right hepatic lobe is typical of metastatic disease and stable from recent abdominal  CT. No other definite liver lesions are identified. Mild hepatic steatosis. 2. No other significant abdominal findings. 3. Omental nodularity seen on CT is not well visualized by MRI.    Imaging     06/23/2022 Imaging    IMPRESSION: 1. Hypodense lesion of the inferior right lobe of the liver, hepatic segment VI is diminished in size, consistent with treatment response. 2. Tiny peritoneal and omental nodules identified by prior examination are diminished in size, consistent with treatment response. 3. Multiple tiny pulmonary nodules unchanged, most likely benign and incidental, however continued attention on follow-up warranted in the setting of known metastatic disease. 4. No evidence of new metastatic disease in the chest, abdomen, or pelvis. 5. Status post partial right hemicolectomy and reanastomosis. 6. Diffuse mosaic attenuation of the airspaces, consistent with small airways disease. 7. Hepatomegaly.      INTERVAL HISTORY: *** Kim Castro is here for a follow up of metastatic colon cancer . She was last seen by Santiago Glad NP on 09/20/2022. She presents to the clinic    All other systems were reviewed with the patient and are negative.  MEDICAL HISTORY:  Past Medical History:  Diagnosis Date   Aortic atherosclerosis (HCC)    Arthritis    feet, lower back   Basal cell carcinoma    arm   Breast cancer of upper-outer quadrant of left female breast (HCC) 06/04/2014   Cataract    immature on the left   Colon cancer (HCC) 08/2019   Diabetes mellitus without complication (HCC)    Diverticulosis    Dizziness    > 11yrs ago;took Antivert    Family history of anesthesia complication  sister slow to wake up with anesthesia   Family history of breast cancer    Family history of colon cancer    Family history of uterine cancer    GERD (gastroesophageal reflux disease)    takes occasional TUMs   History of bronchitis    > 49yrs ago   History of colon polyps     History of hiatal hernia    Small noted on CT   History of pulmonary embolus (PE)    Hypertension    takes Losartan daily and HCTZ   Iron deficiency anemia    Joint pain    Numbness    to toes on each foot   Peripheral neuropathy    feet and toes   Personal history of radiation therapy    Pulmonary nodules    Noted on CT   Radiation 07/31/14-08/28/14   Left Breast 20 fxs   Seasonal allergies    takes Claritin prn   Urinary frequency    Vitamin D deficiency    takes VIt D daily    SURGICAL HISTORY: Past Surgical History:  Procedure Laterality Date   BREAST BIOPSY Bilateral    BREAST LUMPECTOMY Left    BREAST LUMPECTOMY WITH RADIOACTIVE SEED LOCALIZATION Left 07/01/2014   Procedure: LEFT BREAST LUMPECTOMY WITH RADIOACTIVE SEED LOCALIZATION;  Surgeon: Glenna Fellows, MD;  Location: [facility];  Service: General;  Laterality: Left;   CATARACT EXTRACTION Right    COLONOSCOPY  09/24/2019   [place]   LAPAROSCOPIC PARTIAL COLECTOMY N/A 11/01/2019   Procedure: LAPAROSCOPIC PARTIAL COLECTOMY;  Surgeon: [name], MD;  Location: [facility];  Service: General;  Laterality: N/A;   POLYPECTOMY     PORTACATH PLACEMENT N/A 12/10/2019   Procedure: INSERTION PORT-A-CATH ULTRASOUND GUIDED IN RIGHT IJ;  Surgeon: [name], MD;  Location: [facility];  Service: General;  Laterality: N/A;   ROBOTIC ASSISTED TOTAL HYSTERECTOMY Bilateral 12/22/2021   Procedure: XI ROBOTIC ASSISTED TOTAL HYSTERECTOMY WITH BILATERAL SALPINGO OOPHORECTOMY;  Surgeon: Carver Fila, MD;  Location: [facility];  Service: Gynecology;  Laterality: Bilateral;   TOTAL KNEE ARTHROPLASTY Left 10/24/2012   Procedure: TOTAL KNEE ARTHROPLASTY;  Surgeon: Nestor Lewandowsky, MD;  Location: [facility];  Service: Orthopedics;  Laterality: Left;   TOTAL KNEE ARTHROPLASTY Right 01/09/2013   Procedure: TOTAL KNEE ARTHROPLASTY;  Surgeon: Nestor Lewandowsky, MD;  Location: [facility];  Service: Orthopedics;  Laterality: Right;   TUBAL  LIGATION      I have reviewed the social history and family history with the patient and they are unchanged from previous note.  ALLERGIES:  is allergic to oxycodone.  MEDICATIONS:  No current facility-administered medications for this visit.   No current outpatient medications on file.   Facility-Administered Medications Ordered in Other Visits  Medication Dose Route Frequency Provider Last Rate Last Admin   acetaminophen (TYLENOL) tablet 650 mg  650 mg Oral Q6H PRN Orland Mustard, MD       Or   acetaminophen (TYLENOL) suppository 650 mg  650 mg Rectal Q6H PRN Orland Mustard, MD       Chlorhexidine Gluconate Cloth 2 % PADS 6 each  6 each Topical Daily Orland Mustard, MD   6 each at 10/02/22 2115   heparin ADULT infusion 100 units/mL (25000 units/234mL)  1,050 Units/hr Intravenous Continuous Norva Pavlov, RPH 12 mL/hr at 10/02/22 2245 1,200 Units/hr at 10/02/22 2245   insulin aspart (novoLOG) injection 0-9 Units  0-9 Units Subcutaneous TID WC Orland Mustard, MD  ondansetron (ZOFRAN) tablet 4 mg  4 mg Oral Q6H PRN Orland Mustard, MD       Or   ondansetron [facility]) injection 4 mg  4 mg Intravenous Q6H PRN Orland Mustard, MD       sodium chloride flush (NS) 0.9 % injection 10-40 mL  10-40 mL Intracatheter PRN Orland Mustard, MD        PHYSICAL EXAMINATION: ECOG PERFORMANCE STATUS: {CHL ONC ECOG ZO:[id number]}  There were no vitals filed for this visit. Wt Readings from Last 3 Encounters:  10/02/22 214 lb 6.4 oz (97.3 kg)  09/20/22 214 lb 6.4 oz (97.3 kg)  09/05/22 212 lb 14.4 oz (96.6 kg)    {Only keep what was examined. If exam not performed, can use .CEXAM } GENERAL:alert, no distress and comfortable SKIN: skin color, texture, turgor are normal, no rashes or significant lesions EYES: normal, Conjunctiva are pink and non-injected, sclera clear {OROPHARYNX:no exudate, no erythema and lips, buccal mucosa, and tongue normal}  NECK: supple, thyroid normal size,  non-tender, without nodularity LYMPH:  no palpable lymphadenopathy in the cervical, axillary {or inguinal} LUNGS: clear to auscultation and percussion with normal breathing effort HEART: regular rate & rhythm and no murmurs and no lower extremity edema ABDOMEN:abdomen soft, non-tender and normal bowel sounds Musculoskeletal:no cyanosis of digits and no clubbing  NEURO: alert & oriented x 3 with fluent speech, no focal motor/sensory deficits  LABORATORY DATA:  I have reviewed the data as listed    Latest Ref Rng & Units 10/03/2022    6:11 AM 10/02/2022    2:15 PM 09/20/2022    8:34 AM  CBC  WBC 4.0 - 10.5 K/uL 12.7  12.3  18.9   Hemoglobin 12.0 - 15.0 g/dL 81.1  91.4  78.2   Hematocrit 36.0 - 46.0 % 33.0  34.7  34.9   Platelets 150 - 400 K/uL 208  206  201         Latest Ref Rng & Units 10/02/2022    2:15 PM 09/20/2022    8:34 AM 09/05/2022    8:09 AM  CMP  Glucose 70 - 99 mg/dL 956  213  086   BUN 8 - 23 mg/dL 19  19  20    Creatinine 0.44 - 1.00 mg/dL 5.78  4.69  6.29   Sodium 135 - 145 mmol/L 141  143  141   Potassium 3.5 - 5.1 mmol/L 4.3  4.0  4.0   Chloride 98 - 111 mmol/L 108  110  107   CO2 22 - 32 mmol/L 25  25  27    Calcium 8.9 - 10.3 mg/dL 9.4  8.9  9.3   Total Protein 6.5 - 8.1 g/dL 6.8  6.5  6.5   Total Bilirubin 0.3 - 1.2 mg/dL 1.0  0.7  0.6   Alkaline Phos 38 - 126 U/L 172  173  133   AST 15 - 41 U/L 29  38  24   ALT 0 - 44 U/L 25  30  16        RADIOGRAPHIC STUDIES: I have personally reviewed the radiological images as listed and agreed with the findings in the report. CT Angio Chest PE W and/or Wo Contrast  Result Date: 10/02/2022 CLINICAL DATA:  PE detected on outpatient CT. History of metastatic colon cancer EXAM: CT ANGIOGRAPHY CHEST WITH CONTRAST TECHNIQUE: Multidetector CT imaging of the chest was performed using the standard protocol during bolus administration of intravenous contrast. Multiplanar CT image reconstructions and MIPs were obtained  to evaluate  the vascular anatomy. RADIATION DOSE REDUCTION: This exam was performed according to the departmental dose-optimization program which includes automated exposure control, adjustment of the mA and/or kV according to patient size and/or use of iterative reconstruction technique. CONTRAST:  OMNIPAQUE IOHEXOL 350 MG/ML SOLN COMPARISON:  CT 09/27/2022. FINDINGS: Cardiovascular: Satisfactory opacification of the pulmonary arteries. Filling defect within the distal right main pulmonary artery extending into all lobar branches as well as numerous segmental and subsegmental branch arteries of the right upper, right middle, and right lower lobes. No saddle embolism. Smaller burden of left-sided pulmonary emboli with involvement of segmental branch arteries of the left upper and left lower lobes. RV to LV ratio of 0.77 without findings to suggest right heart strain. Thoracic aorta is nonaneurysmal. Scattered atherosclerotic calcifications of the aorta. Heart size is upper limits of normal. No pericardial effusion. Right IJ approach chest port remains in place. Mediastinum/Nodes: Similar subcentimeter mediastinal lymph nodes. No axillary or hilar lymphadenopathy. Trachea and esophagus within normal limits. Lungs/Pleura: Unchanged subpleural radiation fibrosis of the anterior left lung. Diffuse mosaic attenuation bilaterally. Scattered pulmonary nodules are unchanged, largest within the left lower lobe measuring 5 mm (series 12, image 57). No new airspace consolidation. No pleural effusion or pneumothorax. Upper Abdomen: No new or acute abnormality within the included upper abdomen. See recently obtained dedicated CT abdomen pelvis. Musculoskeletal: Postsurgical changes within the left breast. No new or acute bony or chest wall findings. Review of the MIP images confirms the above findings. IMPRESSION: 1. Positive for bilateral pulmonary emboli, right greater than left, as described above. No saddle embolism. No findings  to suggest right heart strain. 2. Diffuse mosaic attenuation bilaterally, which may be secondary to small airways disease. 3. Scattered pulmonary nodules are unchanged, largest within the left lower lobe measuring 5 mm. Continued surveillance per oncology protocols 4. Aortic atherosclerosis (ICD10-I70.0). Electronically Signed   By: Duanne Guess D.O.   On: 10/02/2022 16:06      No orders of the defined types were placed in this encounter.  All questions were answered. The patient knows to call the clinic with any problems, questions or concerns. No barriers to learning was detected. The total time spent in the appointment was {CHL ONC TIME VISIT - [id number]}.     Verlee Rossetti, CMA 10/03/2022   I, Sharlette Dense, CMA, am acting as scribe for [name], MD.   {Add scribe attestation statement}

## 2022-10-03 NOTE — Progress Notes (Signed)
Mobility Specialist - Progress Note   10/03/22 1145  Mobility  Activity Ambulated independently in hallway  Level of Assistance Modified independent, requires aide device or extra time  Assistive Device None  Distance Ambulated (ft) 700 ft  Range of Motion/Exercises Active  Activity Response Tolerated well  Mobility Referral Yes  $Mobility charge 1 Mobility  Mobility Specialist Start Time (ACUTE ONLY) 1125  Mobility Specialist Stop Time (ACUTE ONLY) 1145  Mobility Specialist Time Calculation (min) (ACUTE ONLY) 20 min   Pt was found in bed and agreeable to ambulate. Had no complaints during session and at EOS returned to bed with call bell and necessities in reach.  Billey Chang Mobility Specialist

## 2022-10-03 NOTE — Progress Notes (Signed)
ANTICOAGULATION CONSULT NOTE - Follow Up Consult  Pharmacy Consult for Heparin Indication: pulmonary embolus  Allergies  Allergen Reactions   Oxycodone Nausea And Vomiting    Patient Measurements: Height: 5\' 5"  (165.1 cm) Weight: 97.3 kg (214 lb 6.4 oz) IBW/kg (Calculated) : 57 Heparin Dosing Weight: 79.1 kg  Vital Signs: Temp: 98.3 F (36.8 C) (06/03 0626) Temp Source: Oral (06/03 0626) BP: 115/64 (06/03 0626) Pulse Rate: 60 (06/03 0626)  Labs: Recent Labs    10/02/22 1415 10/02/22 2149 10/03/22 0611  HGB 10.7*  --  10.3*  HCT 34.7*  --  33.0*  PLT 206  --  208  HEPARINUNFRC  --  0.71* 0.85*  CREATININE 0.95  --   --   TROPONINIHS 11  --   --     Estimated Creatinine Clearance: 60 mL/min (by C-G formula based on SCr of 0.95 mg/dL).  Assessment: AC/Heme: New PE. IV heparin (h/o PE 2021) - 5/2: Iron panel WNL - Hep level 0.85, Hgb down to 10.3, Plts 208. Rate running correctly. No bleeding noted by RN.  Goal of Therapy:  Heparin level 0.3-0.7 units/ml Monitor platelets by anticoagulation protocol: Yes   Plan:  Decrease IV heparin to 1050 units/hr Recheck HL in 8 hrs. Daily HL and CBC   Williette Loewe S. Merilynn Finland, PharmD, BCPS Clinical Staff Pharmacist [email or web address]  Merilynn Finland, Josefa [place] Stillinger 10/03/2022,7:44 AM

## 2022-10-04 ENCOUNTER — Telehealth: Payer: Self-pay

## 2022-10-04 ENCOUNTER — Other Ambulatory Visit: Payer: Self-pay | Admitting: Hematology

## 2022-10-04 NOTE — Transitions of Care (Post Inpatient/ED Visit) (Signed)
10/04/2022  Name: Kim Castro MRN: [id number] DOB: 1946-04-19  Today's TOC FU Call Status: Today's TOC FU Call Status:: Successful TOC FU Call Competed TOC FU Call Complete Date: 10/04/22  Transition Care Management Follow-up Telephone Call Date of Discharge: 10/03/22 Discharge Facility: [facility]) Type of Discharge: Inpatient Admission Primary Inpatient Discharge Diagnosis:: PE How have you been since you were released from the hospital?: Better Any questions or concerns?: No  Items Reviewed: Did you receive and understand the discharge instructions provided?: Yes Medications obtained,verified, and reconciled?: Yes (Medications Reviewed) Any new allergies since your discharge?: No Dietary orders reviewed?: NA Do you have support at home?: Yes People in Home: spouse  Medications Reviewed Today: Medications Reviewed Today     Reviewed by Larey Dresser, RN (Registered Nurse) on 10/04/22 at 1012  Med List Status: <None>   Medication Order Taking? Sig Documenting Provider Last Dose Status Informant  apixaban (ELIQUIS) 5 MG TABS tablet [id number] Yes Take 2 tablets (10 mg total) by mouth 2 (two) times daily for 7 days, THEN 1 tablet (5 mg total) 2 (two) times daily for 23 days. Tyrone Nine, MD Taking Active   b complex vitamins capsule [id number]  Take 1 capsule by mouth daily. [provider]  Active Self  Cholecalciferol (VITAMIN D) 2000 UNITS CAPS [id number]  Take 2,000 Units by mouth daily.  [provider]  Active Self  ibuprofen (ADVIL) 200 MG tablet [id number]  Take 200 mg by mouth daily as needed (pain). [provider]  Active Self  loratadine (CLARITIN) 10 MG tablet [id number]  Take 10 mg by mouth daily as needed for allergies. [provider]  Active Self  losartan (COZAAR) 25 MG tablet [id number]  Take 1 tablet (25 mg total) by mouth daily. [name], MD  Active Self  metFORMIN (GLUCOPHAGE) 500 MG tablet [id number]   Take 1 tablet (500 mg total) by mouth in the morning and at bedtime. [name], MD  Active Self  Multiple Vitamins-Minerals (MULTIVITAMIN WITH MINERALS) tablet [id number]  Take 1 tablet by mouth daily. [provider]  Active Self  omeprazole (PRILOSEC) 20 MG capsule [id number]  Take 20 mg by mouth daily before breakfast.  [provider]  Active Self  ondansetron (ZOFRAN) 8 MG tablet [id number]  Take 1 tablet (8 mg total) by mouth every 8 (eight) hours as needed for nausea or vomiting. Start on the third day after chemotherapy. [name], MD  Active Self  potassium chloride (KLOR-CON) 10 MEQ tablet [id number]  Take 1 tablet (10 mEq total) by mouth daily.  Patient taking differently: Take 10 mEq by mouth every evening.   [name], MD  Active Self  pravastatin (PRAVACHOL) 10 MG tablet [id number]  Take 1 tablet (10 mg total) by mouth daily. [name], MD  Active Self  pregabalin (LYRICA) 25 MG capsule [id number]  Take 1 capsule (25 mg total) by mouth 2 (two) times daily. Pollyann Samples, NP  Active Self  prochlorperazine (COMPAZINE) 10 MG tablet [id number]  Take 1 tablet (10 mg total) by mouth every 6 (six) hours as needed for nausea or vomiting. [name], MD  Active Self  triamcinolone ointment (KENALOG) 0.5 % [id number]  Apply 1 Application topically 2 (two) times daily.  Patient not taking: Reported on 10/02/2022   Pollyann Samples, NP  Active Self            Home Care and Equipment/Supplies: Were Home Health Services Ordered?: NA  Any new equipment or medical supplies ordered?: NA  Functional Questionnaire: Do you need assistance with bathing/showering or dressing?: No Do you need assistance with meal preparation?: No Do you need assistance with eating?: No Do you have difficulty maintaining continence: No Do you need assistance with getting out of bed/getting out of a chair/moving?: No Do you have difficulty managing or taking your medications?: No  Follow up  appointments reviewed: PCP Follow-up appointment confirmed?: NA Do you need transportation to your follow-up appointment?: No Do you understand care options if your condition(s) worsen?: Yes-patient verbalized understanding    SIGNATURE Arvil Persons, BSN, RN

## 2022-10-09 NOTE — Assessment & Plan Note (Signed)
-  incidental findings of bilateral distal PEs on restaging CT 10/02/2022, pt was asymptomatic -doppler of LEs (-) DVT -continue Eliquis indefinitely

## 2022-10-09 NOTE — Assessment & Plan Note (Signed)
-  Secondary to oxaliplatin -She is on Lyrica and B complex, continue to monitor.  Overall stable.   

## 2022-10-09 NOTE — Assessment & Plan Note (Signed)
-  The patient understands the goal of care is palliative.  -pt has agreed with DNR, order placed.  

## 2022-10-09 NOTE — Assessment & Plan Note (Signed)
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 Path showed overall Stage IIIB cancer.  -s/p 6 months adjuvant FOLFOX, completed 06/01/20, oxaliplatin held for final 2 cycles due to neuropathy.  -s/p BSO and hysterectomy on 12/22/21 with Dr. Pricilla Holm. Path revealed metastatic moderately differentiated colonic adenocarcinoma involving both ovaries and peritoneum -she restarted FOLFOX on 01/24/22. -Due to disease progression, chemotherapy changed to second line FOLFIRI and bevacizumab on April 06, 2022. She has been tolerating moderately well, better now with dose reduction  -Restaging CT scan from June 23, 2022 showed partial response in liver and peritoneal metastasis, no other new lesions.  I reviewed the images and discussed the results with patient.  She is pleased. -She has been tolerating chemotherapy much better lately due to the dose reduction.  We discussed maintenance therapy option in future  -chemo held last week due to acute PE and hospital admission

## 2022-10-09 NOTE — Assessment & Plan Note (Signed)
-  Dx in 05/2014, s/p left lumpectomy with Dr Hoxworth, adjuvant RT with Dr Wentworth, and Anastrozole 08/2014 - 01/13/20 under Dr Gudena.  -DEXA 09/11/20 T score +1.2 normal.  -most recent mammogram on 10/04/21 was negative. Will continue yearly  

## 2022-10-10 ENCOUNTER — Inpatient Hospital Stay: Payer: Medicare Other | Attending: Hematology

## 2022-10-10 ENCOUNTER — Encounter: Payer: Self-pay | Admitting: Hematology

## 2022-10-10 ENCOUNTER — Other Ambulatory Visit: Payer: Self-pay

## 2022-10-10 ENCOUNTER — Inpatient Hospital Stay: Payer: Medicare Other

## 2022-10-10 ENCOUNTER — Inpatient Hospital Stay: Payer: Medicare Other | Admitting: Hematology

## 2022-10-10 VITALS — BP 142/57 | HR 69 | Temp 97.9°F | Resp 18 | Ht 65.0 in | Wt 211.8 lb

## 2022-10-10 DIAGNOSIS — Z7901 Long term (current) use of anticoagulants: Secondary | ICD-10-CM | POA: Diagnosis not present

## 2022-10-10 DIAGNOSIS — C786 Secondary malignant neoplasm of retroperitoneum and peritoneum: Secondary | ICD-10-CM | POA: Insufficient documentation

## 2022-10-10 DIAGNOSIS — Z803 Family history of malignant neoplasm of breast: Secondary | ICD-10-CM

## 2022-10-10 DIAGNOSIS — C787 Secondary malignant neoplasm of liver and intrahepatic bile duct: Secondary | ICD-10-CM | POA: Diagnosis not present

## 2022-10-10 DIAGNOSIS — Z5189 Encounter for other specified aftercare: Secondary | ICD-10-CM | POA: Diagnosis not present

## 2022-10-10 DIAGNOSIS — C50919 Malignant neoplasm of unspecified site of unspecified female breast: Secondary | ICD-10-CM

## 2022-10-10 DIAGNOSIS — C7963 Secondary malignant neoplasm of bilateral ovaries: Secondary | ICD-10-CM | POA: Insufficient documentation

## 2022-10-10 DIAGNOSIS — C182 Malignant neoplasm of ascending colon: Secondary | ICD-10-CM

## 2022-10-10 DIAGNOSIS — Z66 Do not resuscitate: Secondary | ICD-10-CM | POA: Diagnosis not present

## 2022-10-10 DIAGNOSIS — Z95828 Presence of other vascular implants and grafts: Secondary | ICD-10-CM

## 2022-10-10 DIAGNOSIS — I2694 Multiple subsegmental pulmonary emboli without acute cor pulmonale: Secondary | ICD-10-CM

## 2022-10-10 DIAGNOSIS — Z5111 Encounter for antineoplastic chemotherapy: Secondary | ICD-10-CM | POA: Diagnosis not present

## 2022-10-10 DIAGNOSIS — I2699 Other pulmonary embolism without acute cor pulmonale: Secondary | ICD-10-CM

## 2022-10-10 DIAGNOSIS — Z800 Family history of malignant neoplasm of digestive organs: Secondary | ICD-10-CM

## 2022-10-10 DIAGNOSIS — G620 Drug-induced polyneuropathy: Secondary | ICD-10-CM | POA: Insufficient documentation

## 2022-10-10 DIAGNOSIS — Z8049 Family history of malignant neoplasm of other genital organs: Secondary | ICD-10-CM

## 2022-10-10 DIAGNOSIS — Z853 Personal history of malignant neoplasm of breast: Secondary | ICD-10-CM

## 2022-10-10 DIAGNOSIS — Z86000 Personal history of in-situ neoplasm of breast: Secondary | ICD-10-CM | POA: Diagnosis not present

## 2022-10-10 DIAGNOSIS — T451X5A Adverse effect of antineoplastic and immunosuppressive drugs, initial encounter: Secondary | ICD-10-CM

## 2022-10-10 LAB — CMP (CANCER CENTER ONLY)
ALT: 21 U/L (ref 0–44)
AST: 25 U/L (ref 15–41)
Albumin: 4.1 g/dL (ref 3.5–5.0)
Alkaline Phosphatase: 146 U/L — ABNORMAL HIGH (ref 38–126)
Anion gap: 6 (ref 5–15)
BUN: 25 mg/dL — ABNORMAL HIGH (ref 8–23)
CO2: 23 mmol/L (ref 22–32)
Calcium: 9.5 mg/dL (ref 8.9–10.3)
Chloride: 110 mmol/L (ref 98–111)
Creatinine: 0.84 mg/dL (ref 0.44–1.00)
GFR, Estimated: >60 mL/min (ref >60)
Glucose, Bld: 171 mg/dL — ABNORMAL HIGH (ref 70–99)
Potassium: 4.5 mmol/L (ref 3.5–5.1)
Sodium: 139 mmol/L (ref 135–145)
Total Bilirubin: 0.8 mg/dL (ref 0.3–1.2)
Total Protein: 6.8 g/dL (ref 6.5–8.1)

## 2022-10-10 LAB — CBC WITH DIFFERENTIAL (CANCER CENTER ONLY)
Abs Immature Granulocytes: 0.28 10*3/uL — ABNORMAL HIGH (ref 0.00–0.07)
Basophils Absolute: 0.1 10*3/uL (ref 0.0–0.1)
Basophils Relative: 1 %
Eosinophils Absolute: 0.4 10*3/uL (ref 0.0–0.5)
Eosinophils Relative: 3 %
HCT: 34 % — ABNORMAL LOW (ref 36.0–46.0)
Hemoglobin: 10.9 g/dL — ABNORMAL LOW (ref 12.0–15.0)
Immature Granulocytes: 2 %
Lymphocytes Relative: 21 %
Lymphs Abs: 3.2 10*3/uL (ref 0.7–4.0)
MCH: 32.7 pg (ref 26.0–34.0)
MCHC: 32.1 g/dL (ref 30.0–36.0)
MCV: 102.1 fL — ABNORMAL HIGH (ref 80.0–100.0)
Monocytes Absolute: 0.9 10*3/uL (ref 0.1–1.0)
Monocytes Relative: 6 %
Neutro Abs: 10.2 10*3/uL — ABNORMAL HIGH (ref 1.7–7.7)
Neutrophils Relative %: 67 %
Platelet Count: 266 10*3/uL (ref 150–400)
RBC: 3.33 MIL/uL — ABNORMAL LOW (ref 3.87–5.11)
RDW: 17.3 % — ABNORMAL HIGH (ref 11.5–15.5)
WBC Count: 15 10*3/uL — ABNORMAL HIGH (ref 4.0–10.5)
nRBC: 0 % (ref 0.0–0.2)

## 2022-10-10 LAB — TOTAL PROTEIN, URINE DIPSTICK: Protein, ur: NEGATIVE mg/dL

## 2022-10-10 MED ORDER — HEPARIN SOD (PORK) LOCK FLUSH 100 UNIT/ML IV SOLN: 500.0000 [IU] | Freq: Once | INTRAVENOUS | Status: DC | PRN

## 2022-10-10 MED ORDER — SODIUM CHLORIDE 0.9 % IV SOLN
400.0000 mg/m2 | Freq: Once | INTRAVENOUS | Status: AC
  Administered 2022-10-10: 896 mg via INTRAVENOUS
  Filled 2022-10-10: qty 44.8

## 2022-10-10 MED ORDER — SODIUM CHLORIDE 0.9 % IV SOLN
10.0000 mg | Freq: Once | INTRAVENOUS | Status: AC
  Administered 2022-10-10: 10 mg via INTRAVENOUS
  Filled 2022-10-10: qty 10

## 2022-10-10 MED ORDER — SODIUM CHLORIDE 0.9 % IV SOLN
100.0000 mg/m2 | Freq: Once | INTRAVENOUS | Status: AC
  Administered 2022-10-10: 220 mg via INTRAVENOUS
  Filled 2022-10-10: qty 11

## 2022-10-10 MED ORDER — PALONOSETRON HCL INJECTION 0.25 MG/5ML
0.2500 mg | Freq: Once | INTRAVENOUS | Status: AC
  Administered 2022-10-10: 0.25 mg via INTRAVENOUS
  Filled 2022-10-10: qty 5

## 2022-10-10 MED ORDER — SODIUM CHLORIDE 0.9% FLUSH: 10.0000 mL | INTRAVENOUS | Status: DC | PRN

## 2022-10-10 MED ORDER — SODIUM CHLORIDE 0.9 % IV SOLN: Freq: Once | INTRAVENOUS | Status: AC

## 2022-10-10 MED ORDER — SODIUM CHLORIDE 0.9% FLUSH
10.0000 mL | INTRAVENOUS | Status: DC | PRN
  Administered 2022-10-10: 10 mL

## 2022-10-10 MED ORDER — SODIUM CHLORIDE 0.9 % IV SOLN
2400.0000 mg/m2 | INTRAVENOUS | Status: DC
  Administered 2022-10-10: 5000 mg via INTRAVENOUS
  Filled 2022-10-10: qty 100

## 2022-10-10 MED ORDER — ATROPINE SULFATE 1 MG/ML IV SOLN
0.5000 mg | Freq: Once | INTRAVENOUS | Status: AC | PRN
  Administered 2022-10-10: 0.5 mg via INTRAVENOUS
  Filled 2022-10-10: qty 1

## 2022-10-10 NOTE — Patient Instructions (Signed)
[facility]  Discharge Instructions: Thank you for choosing [facility] to provide your oncology and hematology care.   If you have a lab appointment with the Cancer Center, please go directly to the Cancer Center and check in at the registration area.   Wear comfortable clothing and clothing appropriate for easy access to any Portacath or PICC line.   We strive to give you quality time with your provider. You may need to reschedule your appointment if you arrive late (15 or more minutes).  Arriving late affects you and other patients whose appointments are after yours.  Also, if you miss three or more appointments without notifying the office, you may be dismissed from the clinic at the provider's discretion.      For prescription refill requests, have your pharmacy contact our office and allow 72 hours for refills to be completed.    Today you received the following chemotherapy and/or immunotherapy agents: Irinotecan, Leucovorin, Fluorouracil.       To help prevent nausea and vomiting after your treatment, we encourage you to take your nausea medication as directed.  BELOW ARE SYMPTOMS THAT SHOULD BE REPORTED IMMEDIATELY: *FEVER GREATER THAN 100.4 F (38 C) OR HIGHER *CHILLS OR SWEATING *NAUSEA AND VOMITING THAT IS NOT CONTROLLED WITH YOUR NAUSEA MEDICATION *UNUSUAL SHORTNESS OF BREATH *UNUSUAL BRUISING OR BLEEDING *URINARY PROBLEMS (pain or burning when urinating, or frequent urination) *BOWEL PROBLEMS (unusual diarrhea, constipation, pain near the anus) TENDERNESS IN MOUTH AND THROAT WITH OR WITHOUT PRESENCE OF ULCERS (sore throat, sores in mouth, or a toothache) UNUSUAL RASH, SWELLING OR PAIN  UNUSUAL VAGINAL DISCHARGE OR ITCHING   Items with * indicate a potential emergency and should be followed up as soon as possible or go to the Emergency Department if any problems should occur.  Please show the CHEMOTHERAPY ALERT CARD or  IMMUNOTHERAPY ALERT CARD at check-in to the Emergency Department and triage nurse.  Should you have questions after your visit or need to cancel or reschedule your appointment, please contact [facility]  Dept: [phone]  and follow the prompts.  Office hours are 8:00 a.m. to 4:30 p.m. Monday - Friday. Please note that voicemails left after 4:00 p.m. may not be returned until the following business day.  We are closed weekends and major holidays. You have access to a nurse at all times for urgent questions. Please call the main number to the clinic Dept: [phone] and follow the prompts.   For any non-urgent questions, you may also contact your provider using MyChart. We now offer e-Visits for anyone 18 and older to request care online for non-urgent symptoms. For details visit [email or web address].   Also download the MyChart app! Go to the app store, search "MyChart", open the app, select [facility], and log in with your MyChart username and password.   

## 2022-10-10 NOTE — Progress Notes (Signed)
Nutrition Follow-up:  Patient with right colon cancer.  Patient receiving folfiri and bevacizumab.  Met with patient during infusion. Reports that her appetite is good, "too good."  Usually 2-3 days after getting pump off feels unwell and little queasy but not enough to take medication.  Has been eating 3 meals a day.  Breakfast is cheerios and fruit and yogurt.  Lunch is salad with meat and cheese.  Dinner is meat and 1-2 sides (vegetable, salad).  Snacks on fruit.     Medications: reviewed  Labs: glucose 171  Anthropometrics:   Weight 211 lb 12.8 oz  213 lb 4/22 213 lb on 3/25 221 lb on 2/26 222 lb on 2/12 248 lb on 10/11   NUTRITION DIAGNOSIS: Inadequate oral intake stable   INTERVENTION:  Continue high calorie, high protein foods to maintain weight    MONITORING, EVALUATION, GOAL: weight trends, intake   NEXT VISIT: Monday, August 5th during infusion  Thersia Petraglia B. Freida Busman, RD, LDN Registered Dietitian [phone]

## 2022-10-10 NOTE — Progress Notes (Signed)
[facility]   [phone] [phone]   Clinic Follow up Note   Patient Care Team: [name], MD as PCP - General (Family Medicine) [name], MD as Consulting Physician (General Surgery) [name], MD as Consulting Physician (Hematology) [name], MD as Consulting Physician (General Surgery) [name], MD as Consulting Physician (Hematology and Oncology)  Date of Service:  10/10/2022  CHIEF COMPLAINT: f/u of metastatic colon cancer     CURRENT THERAPY: FOLFIRI and Beva q14 days, since 04/06/2022    ASSESSMENT:  Kim Castro is a 77 y.o. female with   Malignant neoplasm of ascending colon (HCC) pT3N2aM0 stage IIB, MSS, metastatic to b/l ovaries and peritoneum and liver, KRAS mutation G12V (+)  -Initially diagnosed in 08/2019, s/p resection with Dr Maisie Fus on 11/01/19. Path showed overall Stage IIIB cancer.  -s/p 6 months adjuvant FOLFOX, completed 06/01/20, oxaliplatin held for final 2 cycles due to neuropathy.  -s/p BSO and hysterectomy on 12/22/21 with Dr. Pricilla Holm. Path revealed metastatic moderately differentiated colonic adenocarcinoma involving both ovaries and peritoneum -she restarted FOLFOX on 01/24/22. -Due to disease progression, chemotherapy changed to second line FOLFIRI and bevacizumab on April 06, 2022. She has been tolerating moderately well, better now with dose reduction  -Restaging CT scan from June 23, 2022 showed partial response in liver and peritoneal metastasis, no other new lesions.  I reviewed the images and discussed the results with patient.  She is pleased. -She has been tolerating chemotherapy much better lately due to the dose reduction.  We discussed maintenance therapy option in future  -chemo held last week due to acute PE and hospital admission   Malignant neoplasm of female breast [facility]) -Dx in 05/2014, s/p left lumpectomy with Dr Johna Sheriff, adjuvant RT with Dr [name], and Anastrozole  08/2014 - 01/13/20 under Dr Pamelia Hoit.  -DEXA 09/11/20 T score +1.2 normal.  -most recent mammogram on 10/04/21 was negative. Will continue yearly    Pulmonary embolism (HCC) -incidental findings of bilateral distal PEs on restaging CT 10/02/2022, pt was asymptomatic -doppler of LEs (-) DVT -continue Eliquis indefinitely   DNR (do not resuscitate) -The patient understands the goal of care is palliative.  -pt has agreed with DNR, order placed.     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  Overall stable.      PLAN: -la reviewed -proceed with Folfiri today only  -I will stop Bevacizumab due to her recent PE -schedule  per IS  SUMMARY OF ONCOLOGIC HISTORY: Oncology History Overview Note   Cancer Staging  Malignant neoplasm of female breast [facility]) Staging form: Breast, AJCC 7th Edition - Clinical stage from 06/11/2014: Stage 0 (Tis (DCIS), N0, M0) - Unsigned Staged by: Pathologist and managing physician Laterality: Left Estrogen receptor status: Positive Progesterone receptor status: Positive Stage used in treatment planning: Yes National guidelines used in treatment planning: Yes Type of national guideline used in treatment planning: NCCN - Pathologic stage from 07/03/2014: Stage Unknown (Tis (DCIS), NX, cM0) - Signed by [name], MD on 07/10/2014 Staged by: Pathologist Laterality: Left Estrogen receptor status: Positive Progesterone receptor status: Positive Stage used in treatment planning: Yes National guidelines used in treatment planning: Yes Type of national guideline used in treatment planning: NCCN Staging comments: Staged on final lumpectomy specimen by Dr. [name].  Right colon cancer Staging form: Colon and Rectum, AJCC 8th Edition - Pathologic stage from 11/01/2019: Stage IIIB (pT3, pN2a, cM0) -  Signed by [name], MD on 11/29/2019 Stage prefix: Initial diagnosis Histologic grading  system: 4 grade system Histologic grade (G): G2 Residual tumor (R): R0 - None Tumor deposits (TD): Absent Perineural invasion (PNI): Absent Microsatellite instability (MSI): Stable KRAS mutation: Unknown NRAS mutation: Unknown BRAF mutation: Unknown     Malignant neoplasm of female breast (HCC)  05/29/2014 Initial Biopsy   Left breast needle core biopsy: Grade 2, DCIS with calcs. ER+ (100%), PR+ (96%).    06/04/2014 Initial Diagnosis   Left breast DCIS with calcifications, ER 100%, PR 96%   06/10/2014 Breast MRI   Left breast: 2.4 x 1.3 x 1.1 cm area of patchy non-mass enhancement upper outer quadrant includes postbiopsy seroma; Right breast: 1.2 cm previously biopsied stable benign fibroadenoma   06/12/2014 Procedure   Genetic counseling/testing: Identified 1 VUS on CHEK2 gene. Remainder of 17 gene panel tested negative and included: ATM, BARD1, BRCA 1/2, BRIP1, CDH1, CHEK2, EPCAM, MLH1, MSH2, MSH6, NBN, NF1, PALB2, PTEN, RAD50, RAD51C, RAD51D, STK11, and TP53.    07/01/2014 Surgery   Left breast lumpectomy ([facility]): Grade 1, DCIS, spanning 2.3 cm, 1 mm margin, ER 100%, PR 96%   07/31/2014 - 08/28/2014 Radiation Therapy   Adjuvant RT completed [name]). Left breast: Total dose 42.5 Gy over 17 fractions. Left breast boost: Total dose 7.5 Gy over 3 fractions.    09/14/2014 - 01/13/2020 Anti-estrogen oral therapy   Anastrazole 1mg  daily. Planned duration of treatment: 5 years [place]). Completed in 01/2020.    09/25/2014 Survivorship   Survivorship Care Plan given to patient and reviewed with her in person.    03/02/2021 Imaging   CT CAP  IMPRESSION: 1. No findings of active/recurrent malignancy. Partial right hemicolectomy. 2. Endometrial stripe remains mildly thickened, but endometrial biopsy in May was negative for malignancy. 3. Progressive endplate sclerosis and endplate irregularity at T2-3, probably due to degenerative endplate findings. If the has referable upper thoracic  pain/symptoms then thoracic spine MRI could be used for further workup. 4. Other imaging findings of potential clinical significance: Mild cardiomegaly. Aortic Atherosclerosis (ICD10-I70.0). Mild mitral valve calcification. Postoperative findings in the left breast with adjacent radiation port anteriorly in the left upper lobe. Tiny pulmonary nodules in the left lower lobe are unchanged from earliest available comparison of 10/17/2019 and probably benign although may merit surveillance. Multilevel lumbar impingement. Mild pelvic floor laxity.   Malignant neoplasm of ascending colon (HCC)  09/19/2019 Imaging   CT AP W contrast 09/19/19  IMPRESSION Fullness in the cecum, cannot exclude a mass. No evidence for metastatic disease is identified.    09/24/2019 Procedure   Colonoscopy by Dr [name] 09/24/19 IMPRESSION 1. The colon was redundant  2. Mild diverticulosis was noted through the entire examined colon 3. Single 12mm polyp was found in the ascending colon; polypectomy was performed using snare cautery and biopsy forceps 4. Mild diverticulosis was notes in the descending colon and sigmoid colon.  5. Single polyp was found in the sigmoid colon, polypectomy was performed with cold forceps.  6. Single polyp was found in the rectosigmoid colon; polypectomy was performed with cold snare  7. Small internal hemorrhoids  8. Large mass was found at the cecum; multiple biopsies of the area were performed using cold forceps; injection (tattooing) was performed distal to the mass.    09/24/2019 Initial Biopsy   INTERPRETATION AND DIAGNOSIS:  A. Cecum, biopsy:  Invasive moderately differentiated adenocarcinoma.  see comment  B. Polyp @ ascending colon, polypectomy:  Tubular Adenoma  C. Polyp @  sigmoid colon Polypectomy:  hyperplastic polyp.  D. Polyp @ rectosigmoid colon, Polypectomy:  Hyperplastic Polyp      10/16/2019 Imaging   CT Chest IMPRESSION: 1. Multiple small pulmonary nodules measuring  5 mm or less in size in the lungs. These are nonspecific and are typically considered statistically likely benign. However, given the patient's history of primary malignancy, close attention on follow-up studies is recommended to ensure stability. 2. Aortic atherosclerosis, in addition to right coronary artery disease. Assessment for potential risk factor modification, dietary therapy or pharmacologic therapy may be warranted, if clinically indicated. 3. There are calcifications of the aortic valve and mitral annulus. Echocardiographic correlation for evaluation of potential valvular dysfunction may be warranted if clinically indicated. 4. Small hiatal hernia.   Aortic Atherosclerosis (ICD10-I70.0).   11/01/2019 Initial Diagnosis   Colon cancer (HCC)   11/01/2019 Surgery   LAPAROSCOPIC PARTIAL COLECTOMY by Dr Maisie Fus and Dr Michaell Cowing   11/01/2019 Pathology Results   FINAL MICROSCOPIC DIAGNOSIS:   A. COLON, PROXIMAL RIGHT, COLECTOMY:  - Invasive colonic adenocarcinoma, 5 cm.  - Tumor invades through the muscularis propria into pericolonic tissues.   - Margins of resection are not involved.  - Metastatic carcinoma in (5) of (13) lymph nodes.  - See oncology table.    MSI Stable  Mismatch repair normal  MLH1 - Preserved nuclear expression (greater 50% tumor expression) MSH2 - Preserved nuclear expression (greater 50% tumor expression) MSH6 - Preserved nuclear expression (greater 50% tumor expression) PMS2 - Preserved nuclear expression (greater 50% tumor expression)   11/01/2019 Cancer Staging   Staging form: Colon and Rectum, AJCC 8th Edition - Pathologic stage from 11/01/2019: Stage IIIB (pT3, pN2a, cM0) - Signed by [name], MD on 11/29/2019   12/10/2019 Procedure   PAC placed 12/10/19   12/17/2019 - 06/01/2020 Chemotherapy   FOLFOX q2weeks starting in 2 weeks starting 12/17/19. Held 01/27/20-02/10/20 due to b/l PE. Oxaliplatin held C11-12 due to neuropathy. Completed on 06/01/20    03/02/2021 Imaging   CT CAP  IMPRESSION: 1. No findings of active/recurrent malignancy. Partial right hemicolectomy. 2. Endometrial stripe remains mildly thickened, but endometrial biopsy in May was negative for malignancy. 3. Progressive endplate sclerosis and endplate irregularity at T2-3, probably due to degenerative endplate findings. If the has referable upper thoracic pain/symptoms then thoracic spine MRI could be used for further workup. 4. Other imaging findings of potential clinical significance: Mild cardiomegaly. Aortic Atherosclerosis (ICD10-I70.0). Mild mitral valve calcification. Postoperative findings in the left breast with adjacent radiation port anteriorly in the left upper lobe. Tiny pulmonary nodules in the left lower lobe are unchanged from earliest available comparison of 10/17/2019 and probably benign although may merit surveillance. Multilevel lumbar impingement. Mild pelvic floor laxity.   08/26/2021 Imaging   EXAM: CT CHEST, ABDOMEN, AND PELVIS WITH CONTRAST  IMPRESSION: 1. Stable examination without new or progressive findings to suggest local recurrence or metastatic disease within the chest, abdomen, or pelvis. 2. Hepatomegaly with hepatic steatosis. 3. Sigmoid colonic diverticulosis without findings of acute diverticulitis. 4. Similar prominent endplate sclerosis and irregularity at T2-T3 is most consistent with Modic type endplate degenerative changes. However, if patient has referable upper thoracic pain consider further workup with thoracic spine MRI. 5. Similar thickening of the endometrial stripe measuring up to 8 mm, which was previously biopsied with results negative for malignancy. 6. Aortic Atherosclerosis (ICD10-I70.0).   11/10/2021 PET scan   IMPRESSION: 1. LEFT ovary is increased in size and is intensely hypermetabolic. While physiologic hypermetabolic ovarian tissue is  not uncommon, the enlargement and asymmetric activity warrants further  evaluation. Consider contrast pelvic MRI vs tissue sampling. 2. No evidence of metastatic colorectal carcinoma otherwise. 3. Post RIGHT hemicolectomy anatomy. 4. Evidence of radiation change in the LEFT upper lobe (remote breast cancer).     12/22/2021 Relapse/Recurrence    FINAL MICROSCOPIC DIAGNOSIS:   A. LEFT OVARY AND FALLOPIAN TUBE, SALPINGO OOPHORECTOMY:  - Metastatic moderately differentiated colonic adenocarcinoma involving left ovary  - Focal ovarian stromal calcification  - Segment of benign fallopian tube   B. UTERUS WITH RIGHT FALLOPIAN TUBE AND OVARY, HYSTERECTOMY AND SALPINGO-OOPHORECTOMY:  - Metastatic moderately differentiated colonic adenocarcinoma involving right ovary  - Focal invasive extensive adenomyosis  - Benign endometrial polyps  - Benign proliferative phase endometrium  - Hydrosalpinx of right fallopian tube  - Focal ovarian stromal calcification   C. PERITONEAL DEPOSITS, ANTERIOR CUL DE SAC, BIOPSY:  - Metastatic mucinous adenocarcinoma, consistent with colorectal primary    COMMENT:  Immunohistochemical stains show that the tumor cells are positive for CK20 and CDX2 while they are negative for CK7 and PAX8, consistent with above interpretation.    01/24/2022 - 03/23/2022 Chemotherapy   Patient is on Treatment Plan : COLORECTAL FOLFOX q14d x 3 months     04/01/2022 Imaging   CT AP IMPRESSION: 1. New omental metastatic disease. 2. Vague hypoattenuating lesion in the inferior right hepatic lobe is new and also worrisome for metastatic disease. 3. Similar small right lower lobe nodules. Recommend continued attention on follow-up. 4. Steatotic enlarged liver. 5.  Aortic atherosclerosis (ICD10-I70.0).   04/06/2022 -  Chemotherapy   Patient is on Treatment Plan : COLORECTAL FOLFIRI + Bevacizumab q14d     04/21/2022 Imaging    IMPRESSION: 1. Stable chest CT. No evidence of metastatic disease. 2. Stable small pulmonary nodules, considered benign based  on stability. 3. Stable postsurgical changes in the left breast and anterior left upper lobe radiation changes. 4. Aortic Atherosclerosis (ICD10-I70.0) and Emphysema (ICD10-J43.9).   04/21/2022 Imaging    IMPRESSION: 1. Small enhancing lesion inferiorly in the right hepatic lobe is typical of metastatic disease and stable from recent abdominal CT. No other definite liver lesions are identified. Mild hepatic steatosis. 2. No other significant abdominal findings. 3. Omental nodularity seen on CT is not well visualized by MRI.    Imaging     06/23/2022 Imaging    IMPRESSION: 1. Hypodense lesion of the inferior right lobe of the liver, hepatic segment VI is diminished in size, consistent with treatment response. 2. Tiny peritoneal and omental nodules identified by prior examination are diminished in size, consistent with treatment response. 3. Multiple tiny pulmonary nodules unchanged, most likely benign and incidental, however continued attention on follow-up warranted in the setting of known metastatic disease. 4. No evidence of new metastatic disease in the chest, abdomen, or pelvis. 5. Status post partial right hemicolectomy and reanastomosis. 6. Diffuse mosaic attenuation of the airspaces, consistent with small airways disease. 7. Hepatomegaly.      INTERVAL HISTORY:  Kim Castro is here for a follow up of metastatic colon cancer . She was last seen by NP Lacie on 09/20/2022. She presents to the clinic alone. Pt report of doing well and she has no issues clinically.   All other systems were reviewed with the patient and are negative.  MEDICAL HISTORY:  Past Medical History:  Diagnosis Date   Aortic atherosclerosis (HCC)    Arthritis    feet, lower back   Basal cell carcinoma  arm   Breast cancer of upper-outer quadrant of left female breast (HCC) 06/04/2014   Cataract    immature on the left   Colon cancer (HCC) 08/2019   Diabetes mellitus without  complication (HCC)    Diverticulosis    Dizziness    > 38yrs ago;took Antivert    Family history of anesthesia complication    sister slow to wake up with anesthesia   Family history of breast cancer    Family history of colon cancer    Family history of uterine cancer    GERD (gastroesophageal reflux disease)    takes occasional TUMs   History of bronchitis    > 32yrs ago   History of colon polyps    History of hiatal hernia    Small noted on CT   History of pulmonary embolus (PE)    Hypertension    takes Losartan daily and HCTZ   Iron deficiency anemia    Joint pain    Numbness    to toes on each foot   Peripheral neuropathy    feet and toes   Personal history of radiation therapy    Pulmonary nodules    Noted on CT   Radiation 07/31/14-08/28/14   Left Breast 20 fxs   Seasonal allergies    takes Claritin prn   Urinary frequency    Vitamin D deficiency    takes VIt D daily    SURGICAL HISTORY: Past Surgical History:  Procedure Laterality Date   BREAST BIOPSY Bilateral    BREAST LUMPECTOMY Left    BREAST LUMPECTOMY WITH RADIOACTIVE SEED LOCALIZATION Left 07/01/2014   Procedure: LEFT BREAST LUMPECTOMY WITH RADIOACTIVE SEED LOCALIZATION;  Surgeon: Glenna Fellows, MD;  Location: [facility];  Service: General;  Laterality: Left;   CATARACT EXTRACTION Right    COLONOSCOPY  09/24/2019   [place]   LAPAROSCOPIC PARTIAL COLECTOMY N/A 11/01/2019   Procedure: LAPAROSCOPIC PARTIAL COLECTOMY;  Surgeon: [name], MD;  Location: [facility];  Service: General;  Laterality: N/A;   POLYPECTOMY     PORTACATH PLACEMENT N/A 12/10/2019   Procedure: INSERTION PORT-A-CATH ULTRASOUND GUIDED IN RIGHT IJ;  Surgeon: [name], MD;  Location: [facility];  Service: General;  Laterality: N/A;   ROBOTIC ASSISTED TOTAL HYSTERECTOMY Bilateral 12/22/2021   Procedure: XI ROBOTIC ASSISTED TOTAL HYSTERECTOMY WITH BILATERAL SALPINGO OOPHORECTOMY;  Surgeon: Carver Fila, MD;   Location: [facility];  Service: Gynecology;  Laterality: Bilateral;   TOTAL KNEE ARTHROPLASTY Left 10/24/2012   Procedure: TOTAL KNEE ARTHROPLASTY;  Surgeon: Nestor Lewandowsky, MD;  Location: [facility];  Service: Orthopedics;  Laterality: Left;   TOTAL KNEE ARTHROPLASTY Right 01/09/2013   Procedure: TOTAL KNEE ARTHROPLASTY;  Surgeon: Nestor Lewandowsky, MD;  Location: [facility];  Service: Orthopedics;  Laterality: Right;   TUBAL LIGATION      I have reviewed the social history and family history with the patient and they are unchanged from previous note.  ALLERGIES:  is allergic to oxycodone.  MEDICATIONS:  Current Outpatient Medications  Medication Sig Dispense Refill   apixaban (ELIQUIS) 5 MG TABS tablet Take 2 tablets (10 mg total) by mouth 2 (two) times daily for 7 days, THEN 1 tablet (5 mg total) 2 (two) times daily for 23 days. 74 tablet 0   b complex vitamins capsule Take 1 capsule by mouth daily.     Cholecalciferol (VITAMIN D) 2000 UNITS CAPS Take 2,000 Units by mouth daily.      ibuprofen (ADVIL) 200 MG  tablet Take 200 mg by mouth daily as needed (pain).     loratadine (CLARITIN) 10 MG tablet Take 10 mg by mouth daily as needed for allergies.     losartan (COZAAR) 25 MG tablet Take 1 tablet (25 mg total) by mouth daily. 90 tablet 3   metFORMIN (GLUCOPHAGE) 500 MG tablet Take 1 tablet (500 mg total) by mouth in the morning and at bedtime. 180 tablet 3   Multiple Vitamins-Minerals (MULTIVITAMIN WITH MINERALS) tablet Take 1 tablet by mouth daily.     omeprazole (PRILOSEC) 20 MG capsule Take 20 mg by mouth daily before breakfast.      ondansetron (ZOFRAN) 8 MG tablet Take 1 tablet (8 mg total) by mouth every 8 (eight) hours as needed for nausea or vomiting. Start on the third day after chemotherapy. 30 tablet 1   potassium chloride (KLOR-CON) 10 MEQ tablet Take 1 tablet by mouth once daily 30 tablet 0   pravastatin (PRAVACHOL) 10 MG tablet Take 1 tablet (10 mg total) by mouth daily. 90 tablet 3    pregabalin (LYRICA) 25 MG capsule Take 1 capsule (25 mg total) by mouth 2 (two) times daily. 60 capsule 3   prochlorperazine (COMPAZINE) 10 MG tablet Take 1 tablet (10 mg total) by mouth every 6 (six) hours as needed for nausea or vomiting. 30 tablet 1   triamcinolone ointment (KENALOG) 0.5 % Apply 1 Application topically 2 (two) times daily. (Patient not taking: Reported on 10/02/2022) 30 g 0   No current facility-administered medications for this visit.   Facility-Administered Medications Ordered in Other Visits  Medication Dose Route Frequency Provider Last Rate Last Admin   fluorouracil (ADRUCIL) 5,000 mg in sodium chloride 0.9 % 150 mL chemo infusion  2,400 mg/m2 (Treatment Plan Recorded) Intravenous 1 day or 1 dose [name], MD       heparin lock flush 100 unit/mL  500 Units Intracatheter Once PRN [name], MD       irinotecan (CAMPTOSAR) 220 mg in sodium chloride 0.9 % 500 mL chemo infusion  100 mg/m2 (Treatment Plan Recorded) Intravenous Once [name], MD 341 mL/hr at 10/10/22 1113 220 mg at 10/10/22 1113   leucovorin 896 mg in sodium chloride 0.9 % 250 mL infusion  400 mg/m2 (Treatment Plan Recorded) Intravenous Once [name], MD 197 mL/hr at 10/10/22 1110 896 mg at 10/10/22 1110   sodium chloride flush (NS) 0.9 % injection 10 mL  10 mL Intracatheter PRN [name], MD        PHYSICAL EXAMINATION: ECOG PERFORMANCE STATUS: 1 - Symptomatic but completely ambulatory  Vitals:   10/10/22 0918  BP: (!) 142/57  Pulse: 69  Resp: 18  Temp: 97.9 F (36.6 C)  SpO2: 98%   Wt Readings from Last 3 Encounters:  10/10/22 211 lb 12.8 oz (96.1 kg)  10/02/22 214 lb 6.4 oz (97.3 kg)  09/20/22 214 lb 6.4 oz (97.3 kg)     GENERAL:alert, no distress and comfortable SKIN: skin color normal, no rashes or significant lesions EYES: normal, Conjunctiva are pink and non-injected, sclera clear  NEURO: alert & oriented x 3 with fluent speech LUNGS: (-) clear to auscultation and percussion with normal  breathing effort HEART: (-) regular rate & rhythm and no murmurs and no lower extremity edema  LABORATORY DATA:  I have reviewed the data as listed    Latest Ref Rng & Units 10/10/2022    8:52 AM 10/03/2022    6:11 AM 10/02/2022    2:15 PM  CBC  WBC 4.0 - 10.5 K/uL 15.0  12.7  12.3   Hemoglobin 12.0 - 15.0 g/dL 16.1  09.6  04.5   Hematocrit 36.0 - 46.0 % 34.0  33.0  34.7   Platelets 150 - 400 K/uL 266  208  206         Latest Ref Rng & Units 10/10/2022    8:52 AM 10/02/2022    2:15 PM 09/20/2022    8:34 AM  CMP  Glucose 70 - 99 mg/dL 409  811  914   BUN 8 - 23 mg/dL 25  19  19    Creatinine 0.44 - 1.00 mg/dL 7.82  9.56  2.13   Sodium 135 - 145 mmol/L 139  141  143   Potassium 3.5 - 5.1 mmol/L 4.5  4.3  4.0   Chloride 98 - 111 mmol/L 110  108  110   CO2 22 - 32 mmol/L 23  25  25    Calcium 8.9 - 10.3 mg/dL 9.5  9.4  8.9   Total Protein 6.5 - 8.1 g/dL 6.8  6.8  6.5   Total Bilirubin 0.3 - 1.2 mg/dL 0.8  1.0  0.7   Alkaline Phos 38 - 126 U/L 146  172  173   AST 15 - 41 U/L 25  29  38   ALT 0 - 44 U/L 21  25  30        RADIOGRAPHIC STUDIES: I have personally reviewed the radiological images as listed and agreed with the findings in the report. No results found.    Orders Placed This Encounter  Procedures   CBC with Differential (Cancer Center Only)    Standing Status:   Future    Standing Expiration Date:   11/07/2023   CMP (Cancer Center only)    Standing Status:   Future    Standing Expiration Date:   11/07/2023   CBC with Differential (Cancer Center Only)    Standing Status:   Future    Standing Expiration Date:   11/21/2023   CMP (Cancer Center only)    Standing Status:   Future    Standing Expiration Date:   11/21/2023   CBC with Differential (Cancer Center Only)    Standing Status:   Future    Standing Expiration Date:   12/05/2023   CMP (Cancer Center only)    Standing Status:   Future    Standing Expiration Date:   12/05/2023   All questions were answered. The patient  knows to call the clinic with any problems, questions or concerns. No barriers to learning was detected. The total time spent in the appointment was 25 minutes.     [name], MD 10/10/2022   Carolin Coy, CMA, am acting as scribe for [name], MD.   I have reviewed the above documentation for accuracy and completeness, and I agree with the above.

## 2022-10-11 ENCOUNTER — Ambulatory Visit: Payer: Medicare Other

## 2022-10-11 ENCOUNTER — Other Ambulatory Visit: Payer: Self-pay

## 2022-10-12 ENCOUNTER — Inpatient Hospital Stay: Payer: Medicare Other

## 2022-10-12 ENCOUNTER — Other Ambulatory Visit: Payer: Self-pay

## 2022-10-12 VITALS — BP 142/56 | HR 52 | Temp 98.2°F | Resp 18

## 2022-10-12 DIAGNOSIS — Z7901 Long term (current) use of anticoagulants: Secondary | ICD-10-CM | POA: Diagnosis not present

## 2022-10-12 DIAGNOSIS — C182 Malignant neoplasm of ascending colon: Secondary | ICD-10-CM

## 2022-10-12 DIAGNOSIS — I2699 Other pulmonary embolism without acute cor pulmonale: Secondary | ICD-10-CM | POA: Diagnosis not present

## 2022-10-12 DIAGNOSIS — C786 Secondary malignant neoplasm of retroperitoneum and peritoneum: Secondary | ICD-10-CM | POA: Diagnosis not present

## 2022-10-12 DIAGNOSIS — Z5189 Encounter for other specified aftercare: Secondary | ICD-10-CM | POA: Diagnosis not present

## 2022-10-12 DIAGNOSIS — Z5111 Encounter for antineoplastic chemotherapy: Secondary | ICD-10-CM | POA: Diagnosis not present

## 2022-10-12 DIAGNOSIS — C787 Secondary malignant neoplasm of liver and intrahepatic bile duct: Secondary | ICD-10-CM | POA: Diagnosis not present

## 2022-10-12 DIAGNOSIS — Z66 Do not resuscitate: Secondary | ICD-10-CM | POA: Diagnosis not present

## 2022-10-12 DIAGNOSIS — Z86000 Personal history of in-situ neoplasm of breast: Secondary | ICD-10-CM | POA: Diagnosis not present

## 2022-10-12 DIAGNOSIS — C7963 Secondary malignant neoplasm of bilateral ovaries: Secondary | ICD-10-CM | POA: Diagnosis not present

## 2022-10-12 DIAGNOSIS — G620 Drug-induced polyneuropathy: Secondary | ICD-10-CM | POA: Diagnosis not present

## 2022-10-12 MED ORDER — PEGFILGRASTIM-CBQV 6 MG/0.6ML ~~LOC~~ SOSY
6.0000 mg | PREFILLED_SYRINGE | Freq: Once | SUBCUTANEOUS | Status: AC
  Administered 2022-10-12: 6 mg via SUBCUTANEOUS
  Filled 2022-10-12: qty 0.6

## 2022-10-12 MED ORDER — SODIUM CHLORIDE 0.9% FLUSH
10.0000 mL | INTRAVENOUS | Status: DC | PRN
  Administered 2022-10-12: 10 mL

## 2022-10-12 MED ORDER — HEPARIN SOD (PORK) LOCK FLUSH 100 UNIT/ML IV SOLN
500.0000 [IU] | Freq: Once | INTRAVENOUS | Status: AC | PRN
  Administered 2022-10-12: 500 [IU]

## 2022-10-17 ENCOUNTER — Ambulatory Visit: Payer: Medicare Other

## 2022-10-17 ENCOUNTER — Other Ambulatory Visit: Payer: Medicare Other

## 2022-10-17 ENCOUNTER — Ambulatory Visit: Payer: Medicare Other | Admitting: Hematology

## 2022-10-18 DIAGNOSIS — E119 Type 2 diabetes mellitus without complications: Secondary | ICD-10-CM | POA: Diagnosis not present

## 2022-10-18 DIAGNOSIS — H52223 Regular astigmatism, bilateral: Secondary | ICD-10-CM | POA: Diagnosis not present

## 2022-10-18 DIAGNOSIS — H25812 Combined forms of age-related cataract, left eye: Secondary | ICD-10-CM | POA: Diagnosis not present

## 2022-10-18 DIAGNOSIS — H524 Presbyopia: Secondary | ICD-10-CM | POA: Diagnosis not present

## 2022-10-18 LAB — HM DIABETES EYE EXAM

## 2022-10-20 DIAGNOSIS — I10 Essential (primary) hypertension: Secondary | ICD-10-CM | POA: Diagnosis not present

## 2022-10-21 MED FILL — Dexamethasone Sodium Phosphate Inj 100 MG/10ML: INTRAMUSCULAR | Qty: 1 | Status: AC

## 2022-10-23 NOTE — Progress Notes (Unsigned)
Patient Care Team: [name], MD as PCP - General (Family Medicine) [name], MD as Consulting Physician (General Surgery) [name], MD as Consulting Physician (Hematology) [name], MD as Consulting Physician (General Surgery) [name], MD as Consulting Physician (Hematology and Oncology)   CHIEF COMPLAINT: Follow up metastatic colon cancer   Oncology History Overview Note   Cancer Staging  Malignant neoplasm of female breast [facility]) Staging form: Breast, AJCC 7th Edition - Clinical stage from 06/11/2014: Stage 0 (Tis (DCIS), N0, M0) - Unsigned Staged by: Pathologist and managing physician Laterality: Left Estrogen receptor status: Positive Progesterone receptor status: Positive Stage used in treatment planning: Yes National guidelines used in treatment planning: Yes Type of national guideline used in treatment planning: NCCN - Pathologic stage from 07/03/2014: Stage Unknown (Tis (DCIS), NX, cM0) - Signed by [name], MD on 07/10/2014 Staged by: Pathologist Laterality: Left Estrogen receptor status: Positive Progesterone receptor status: Positive Stage used in treatment planning: Yes National guidelines used in treatment planning: Yes Type of national guideline used in treatment planning: NCCN Staging comments: Staged on final lumpectomy specimen by Dr. [name].  Right colon cancer Staging form: Colon and Rectum, AJCC 8th Edition - Pathologic stage from 11/01/2019: Stage IIIB (pT3, pN2a, cM0) - Signed by [name], MD on 11/29/2019 Stage prefix: Initial diagnosis Histologic grading system: 4 grade system Histologic grade (G): G2 Residual tumor (R): R0 - None Tumor deposits (TD): Absent Perineural invasion (PNI): Absent Microsatellite instability (MSI): Stable KRAS mutation: Unknown NRAS mutation: Unknown BRAF mutation: Unknown     Malignant neoplasm of female breast (HCC)  05/29/2014 Initial Biopsy   Left breast needle core biopsy: Grade 2,  DCIS with calcs. ER+ (100%), PR+ (96%).    06/04/2014 Initial Diagnosis   Left breast DCIS with calcifications, ER 100%, PR 96%   06/10/2014 Breast MRI   Left breast: 2.4 x 1.3 x 1.1 cm area of patchy non-mass enhancement upper outer quadrant includes postbiopsy seroma; Right breast: 1.2 cm previously biopsied stable benign fibroadenoma   06/12/2014 Procedure   Genetic counseling/testing: Identified 1 VUS on CHEK2 gene. Remainder of 17 gene panel tested negative and included: ATM, BARD1, BRCA 1/2, BRIP1, CDH1, CHEK2, EPCAM, MLH1, MSH2, MSH6, NBN, NF1, PALB2, PTEN, RAD50, RAD51C, RAD51D, STK11, and TP53.    07/01/2014 Surgery   Left breast lumpectomy ([facility]): Grade 1, DCIS, spanning 2.3 cm, 1 mm margin, ER 100%, PR 96%   07/31/2014 - 08/28/2014 Radiation Therapy   Adjuvant RT completed [name]). Left breast: Total dose 42.5 Gy over 17 fractions. Left breast boost: Total dose 7.5 Gy over 3 fractions.    09/14/2014 - 01/13/2020 Anti-estrogen oral therapy   Anastrazole 1mg  daily. Planned duration of treatment: 5 years [place]). Completed in 01/2020.    09/25/2014 Survivorship   Survivorship Care Plan given to patient and reviewed with her in person.    03/02/2021 Imaging   CT CAP  IMPRESSION: 1. No findings of active/recurrent malignancy. Partial right hemicolectomy. 2. Endometrial stripe remains mildly thickened, but endometrial biopsy in May was negative for malignancy. 3. Progressive endplate sclerosis and endplate irregularity at T2-3, probably due to degenerative endplate findings. If the has referable upper thoracic pain/symptoms then thoracic spine MRI could be used for further workup. 4. Other imaging findings of potential clinical significance: Mild cardiomegaly. Aortic Atherosclerosis (ICD10-I70.0). Mild mitral valve calcification. Postoperative findings in the left breast with adjacent radiation port anteriorly in the left upper lobe. Tiny pulmonary nodules in the left lower lobe  are unchanged from earliest available comparison of 10/17/2019 and probably benign although may merit surveillance. Multilevel lumbar impingement. Mild pelvic floor laxity.   Malignant neoplasm of ascending colon (HCC)  09/19/2019 Imaging   CT AP W contrast 09/19/19  IMPRESSION Fullness in the cecum, cannot exclude a mass. No evidence for metastatic disease is identified.    09/24/2019 Procedure   Colonoscopy by Dr [name] 09/24/19 IMPRESSION 1. The colon was redundant  2. Mild diverticulosis was noted through the entire examined colon 3. Single 12mm polyp was found in the ascending colon; polypectomy was performed using snare cautery and biopsy forceps 4. Mild diverticulosis was notes in the descending colon and sigmoid colon.  5. Single polyp was found in the sigmoid colon, polypectomy was performed with cold forceps.  6. Single polyp was found in the rectosigmoid colon; polypectomy was performed with cold snare  7. Small internal hemorrhoids  8. Large mass was found at the cecum; multiple biopsies of the area were performed using cold forceps; injection (tattooing) was performed distal to the mass.    09/24/2019 Initial Biopsy   INTERPRETATION AND DIAGNOSIS:  A. Cecum, biopsy:  Invasive moderately differentiated adenocarcinoma.  see comment  B. Polyp @ ascending colon, polypectomy:  Tubular Adenoma  C. Polyp @ sigmoid colon Polypectomy:  hyperplastic polyp.  D. Polyp @ rectosigmoid colon, Polypectomy:  Hyperplastic Polyp      10/16/2019 Imaging   CT Chest IMPRESSION: 1. Multiple small pulmonary nodules measuring 5 mm or less in size in the lungs. These are nonspecific and are typically considered statistically likely benign. However, given the patient's history of primary malignancy, close attention on follow-up studies is recommended to ensure stability. 2. Aortic atherosclerosis, in addition to right coronary artery disease. Assessment for potential risk factor modification,  dietary therapy or pharmacologic therapy may be warranted, if clinically indicated. 3. There are calcifications of the aortic valve and mitral annulus. Echocardiographic correlation for evaluation of potential valvular dysfunction may be warranted if clinically indicated. 4. Small hiatal hernia.   Aortic Atherosclerosis (ICD10-I70.0).   11/01/2019 Initial Diagnosis   Colon cancer (HCC)   11/01/2019 Surgery   LAPAROSCOPIC PARTIAL COLECTOMY by Dr Maisie Fus and Dr Michaell Cowing   11/01/2019 Pathology Results   FINAL MICROSCOPIC DIAGNOSIS:   A. COLON, PROXIMAL RIGHT, COLECTOMY:  - Invasive colonic adenocarcinoma, 5 cm.  - Tumor invades through the muscularis propria into pericolonic tissues.   - Margins of resection are not involved.  - Metastatic carcinoma in (5) of (13) lymph nodes.  - See oncology table.    MSI Stable  Mismatch repair normal  MLH1 - Preserved nuclear expression (greater 50% tumor expression) MSH2 - Preserved nuclear expression (greater 50% tumor expression) MSH6 - Preserved nuclear expression (greater 50% tumor expression) PMS2 - Preserved nuclear expression (greater 50% tumor expression)   11/01/2019 Cancer Staging   Staging form: Colon and Rectum, AJCC 8th Edition - Pathologic stage from 11/01/2019: Stage IIIB (pT3, pN2a, cM0) - Signed by [name], MD on 11/29/2019   12/10/2019 Procedure   PAC placed 12/10/19   12/17/2019 - 06/01/2020 Chemotherapy   FOLFOX q2weeks starting in 2 weeks starting 12/17/19. Held 01/27/20-02/10/20 due to b/l PE. Oxaliplatin held C11-12 due to neuropathy. Completed on 06/01/20   03/02/2021 Imaging   CT CAP  IMPRESSION: 1. No findings of active/recurrent malignancy. Partial right hemicolectomy. 2. Endometrial stripe remains mildly thickened, but endometrial biopsy in May was negative for malignancy. 3. Progressive endplate sclerosis and endplate irregularity at T2-3, probably due to  degenerative endplate findings. If the has referable upper  thoracic pain/symptoms then thoracic spine MRI could be used for further workup. 4. Other imaging findings of potential clinical significance: Mild cardiomegaly. Aortic Atherosclerosis (ICD10-I70.0). Mild mitral valve calcification. Postoperative findings in the left breast with adjacent radiation port anteriorly in the left upper lobe. Tiny pulmonary nodules in the left lower lobe are unchanged from earliest available comparison of 10/17/2019 and probably benign although may merit surveillance. Multilevel lumbar impingement. Mild pelvic floor laxity.   08/26/2021 Imaging   EXAM: CT CHEST, ABDOMEN, AND PELVIS WITH CONTRAST  IMPRESSION: 1. Stable examination without new or progressive findings to suggest local recurrence or metastatic disease within the chest, abdomen, or pelvis. 2. Hepatomegaly with hepatic steatosis. 3. Sigmoid colonic diverticulosis without findings of acute diverticulitis. 4. Similar prominent endplate sclerosis and irregularity at T2-T3 is most consistent with Modic type endplate degenerative changes. However, if patient has referable upper thoracic pain consider further workup with thoracic spine MRI. 5. Similar thickening of the endometrial stripe measuring up to 8 mm, which was previously biopsied with results negative for malignancy. 6. Aortic Atherosclerosis (ICD10-I70.0).   11/10/2021 PET scan   IMPRESSION: 1. LEFT ovary is increased in size and is intensely hypermetabolic. While physiologic hypermetabolic ovarian tissue is not uncommon, the enlargement and asymmetric activity warrants further evaluation. Consider contrast pelvic MRI vs tissue sampling. 2. No evidence of metastatic colorectal carcinoma otherwise. 3. Post RIGHT hemicolectomy anatomy. 4. Evidence of radiation change in the LEFT upper lobe (remote breast cancer).     12/22/2021 Relapse/Recurrence    FINAL MICROSCOPIC DIAGNOSIS:   A. LEFT OVARY AND FALLOPIAN TUBE, SALPINGO OOPHORECTOMY:  -  Metastatic moderately differentiated colonic adenocarcinoma involving left ovary  - Focal ovarian stromal calcification  - Segment of benign fallopian tube   B. UTERUS WITH RIGHT FALLOPIAN TUBE AND OVARY, HYSTERECTOMY AND SALPINGO-OOPHORECTOMY:  - Metastatic moderately differentiated colonic adenocarcinoma involving right ovary  - Focal invasive extensive adenomyosis  - Benign endometrial polyps  - Benign proliferative phase endometrium  - Hydrosalpinx of right fallopian tube  - Focal ovarian stromal calcification   C. PERITONEAL DEPOSITS, ANTERIOR CUL DE SAC, BIOPSY:  - Metastatic mucinous adenocarcinoma, consistent with colorectal primary    COMMENT:  Immunohistochemical stains show that the tumor cells are positive for CK20 and CDX2 while they are negative for CK7 and PAX8, consistent with above interpretation.    01/24/2022 - 03/23/2022 Chemotherapy   Patient is on Treatment Plan : COLORECTAL FOLFOX q14d x 3 months     04/01/2022 Imaging   CT AP IMPRESSION: 1. New omental metastatic disease. 2. Vague hypoattenuating lesion in the inferior right hepatic lobe is new and also worrisome for metastatic disease. 3. Similar small right lower lobe nodules. Recommend continued attention on follow-up. 4. Steatotic enlarged liver. 5.  Aortic atherosclerosis (ICD10-I70.0).   04/06/2022 -  Chemotherapy   Patient is on Treatment Plan : COLORECTAL FOLFIRI + Bevacizumab q14d     04/21/2022 Imaging    IMPRESSION: 1. Stable chest CT. No evidence of metastatic disease. 2. Stable small pulmonary nodules, considered benign based on stability. 3. Stable postsurgical changes in the left breast and anterior left upper lobe radiation changes. 4. Aortic Atherosclerosis (ICD10-I70.0) and Emphysema (ICD10-J43.9).   04/21/2022 Imaging    IMPRESSION: 1. Small enhancing lesion inferiorly in the right hepatic lobe is typical of metastatic disease and stable from recent abdominal CT. No other  definite liver lesions are identified. Mild hepatic steatosis. 2. No other significant abdominal  findings. 3. Omental nodularity seen on CT is not well visualized by MRI.    Imaging     06/23/2022 Imaging    IMPRESSION: 1. Hypodense lesion of the inferior right lobe of the liver, hepatic segment VI is diminished in size, consistent with treatment response. 2. Tiny peritoneal and omental nodules identified by prior examination are diminished in size, consistent with treatment response. 3. Multiple tiny pulmonary nodules unchanged, most likely benign and incidental, however continued attention on follow-up warranted in the setting of known metastatic disease. 4. No evidence of new metastatic disease in the chest, abdomen, or pelvis. 5. Status post partial right hemicolectomy and reanastomosis. 6. Diffuse mosaic attenuation of the airspaces, consistent with small airways disease. 7. Hepatomegaly.      CURRENT THERAPY: FOLFIRI/Beva q14 days, starting 04/06/22. Beva stopped due to PE on restaging CT 10/2022  INTERVAL HISTORY Ms. Yetman returns for follow up and treatment as scheduled. Last seen by Dr. Mosetta Putt 10/10/22 and completed another cycle of FOLFIRI.   ROS   Past Medical History:  Diagnosis Date   Aortic atherosclerosis (HCC)    Arthritis    feet, lower back   Basal cell carcinoma    arm   Breast cancer of upper-outer quadrant of left female breast (HCC) 06/04/2014   Cataract    immature on the left   Colon cancer (HCC) 08/2019   Diabetes mellitus without complication (HCC)    Diverticulosis    Dizziness    > 18yrs ago;took Antivert    Family history of anesthesia complication    sister slow to wake up with anesthesia   Family history of breast cancer    Family history of colon cancer    Family history of uterine cancer    GERD (gastroesophageal reflux disease)    takes occasional TUMs   History of bronchitis    > 54yrs ago   History of colon polyps    History of  hiatal hernia    Small noted on CT   History of pulmonary embolus (PE)    Hypertension    takes Losartan daily and HCTZ   Iron deficiency anemia    Joint pain    Numbness    to toes on each foot   Peripheral neuropathy    feet and toes   Personal history of radiation therapy    Pulmonary nodules    Noted on CT   Radiation 07/31/14-08/28/14   Left Breast 20 fxs   Seasonal allergies    takes Claritin prn   Urinary frequency    Vitamin D deficiency    takes VIt D daily     Past Surgical History:  Procedure Laterality Date   BREAST BIOPSY Bilateral    BREAST LUMPECTOMY Left    BREAST LUMPECTOMY WITH RADIOACTIVE SEED LOCALIZATION Left 07/01/2014   Procedure: LEFT BREAST LUMPECTOMY WITH RADIOACTIVE SEED LOCALIZATION;  Surgeon: Glenna Fellows, MD;  Location: [facility];  Service: General;  Laterality: Left;   CATARACT EXTRACTION Right    COLONOSCOPY  09/24/2019   [place]   LAPAROSCOPIC PARTIAL COLECTOMY N/A 11/01/2019   Procedure: LAPAROSCOPIC PARTIAL COLECTOMY;  Surgeon: [name], MD;  Location: [facility];  Service: General;  Laterality: N/A;   POLYPECTOMY     PORTACATH PLACEMENT N/A 12/10/2019   Procedure: INSERTION PORT-A-CATH ULTRASOUND GUIDED IN RIGHT IJ;  Surgeon: [name], MD;  Location: [facility];  Service: General;  Laterality: N/A;   ROBOTIC ASSISTED TOTAL HYSTERECTOMY Bilateral 12/22/2021   Procedure: XI  ROBOTIC ASSISTED TOTAL HYSTERECTOMY WITH BILATERAL SALPINGO OOPHORECTOMY;  Surgeon: Carver Fila, MD;  Location: [facility];  Service: Gynecology;  Laterality: Bilateral;   TOTAL KNEE ARTHROPLASTY Left 10/24/2012   Procedure: TOTAL KNEE ARTHROPLASTY;  Surgeon: Nestor Lewandowsky, MD;  Location: [facility];  Service: Orthopedics;  Laterality: Left;   TOTAL KNEE ARTHROPLASTY Right 01/09/2013   Procedure: TOTAL KNEE ARTHROPLASTY;  Surgeon: Nestor Lewandowsky, MD;  Location: [facility];  Service: Orthopedics;  Laterality: Right;   TUBAL LIGATION       Outpatient  Encounter Medications as of 10/24/2022  Medication Sig   apixaban (ELIQUIS) 5 MG TABS tablet Take 2 tablets (10 mg total) by mouth 2 (two) times daily for 7 days, THEN 1 tablet (5 mg total) 2 (two) times daily for 23 days.   b complex vitamins capsule Take 1 capsule by mouth daily.   Cholecalciferol (VITAMIN D) 2000 UNITS CAPS Take 2,000 Units by mouth daily.    ibuprofen (ADVIL) 200 MG tablet Take 200 mg by mouth daily as needed (pain).   loratadine (CLARITIN) 10 MG tablet Take 10 mg by mouth daily as needed for allergies.   losartan (COZAAR) 25 MG tablet Take 1 tablet (25 mg total) by mouth daily.   metFORMIN (GLUCOPHAGE) 500 MG tablet Take 1 tablet (500 mg total) by mouth in the morning and at bedtime.   Multiple Vitamins-Minerals (MULTIVITAMIN WITH MINERALS) tablet Take 1 tablet by mouth daily.   omeprazole (PRILOSEC) 20 MG capsule Take 20 mg by mouth daily before breakfast.    ondansetron (ZOFRAN) 8 MG tablet Take 1 tablet (8 mg total) by mouth every 8 (eight) hours as needed for nausea or vomiting. Start on the third day after chemotherapy.   potassium chloride (KLOR-CON) 10 MEQ tablet Take 1 tablet by mouth once daily   pravastatin (PRAVACHOL) 10 MG tablet Take 1 tablet (10 mg total) by mouth daily.   pregabalin (LYRICA) 25 MG capsule Take 1 capsule (25 mg total) by mouth 2 (two) times daily.   prochlorperazine (COMPAZINE) 10 MG tablet Take 1 tablet (10 mg total) by mouth every 6 (six) hours as needed for nausea or vomiting.   triamcinolone ointment (KENALOG) 0.5 % Apply 1 Application topically 2 (two) times daily. (Patient not taking: Reported on 10/02/2022)   No facility-administered encounter medications on file as of 10/24/2022.     There were no vitals filed for this visit. There is no height or weight on file to calculate BMI.   PHYSICAL EXAM GENERAL:alert, no distress and comfortable SKIN: no rash  EYES: sclera clear NECK: without mass LYMPH:  no palpable cervical or  supraclavicular lymphadenopathy  LUNGS: clear with normal breathing effort HEART: regular rate & rhythm, no lower extremity edema ABDOMEN: abdomen soft, non-tender and normal bowel sounds NEURO: alert & oriented x 3 with fluent speech, no focal motor/sensory deficits Breast exam:  PAC without erythema    CBC    Component Value Date/Time   WBC 15.0 (H) 10/10/2022 0852   WBC 12.7 (H) 10/03/2022 0611   RBC 3.33 (L) 10/10/2022 0852   HGB 10.9 (L) 10/10/2022 0852   HGB 13.0 10/03/2014 0914   HCT 34.0 (L) 10/10/2022 0852   HCT 38.1 10/03/2014 0914   PLT 266 10/10/2022 0852   PLT 310 10/03/2014 0914   MCV 102.1 (H) 10/10/2022 0852   MCV 93.9 10/03/2014 0914   MCH 32.7 10/10/2022 0852   MCHC 32.1 10/10/2022 0852   RDW 17.3 (H) 10/10/2022 1610  RDW 13.8 10/03/2014 0914   LYMPHSABS 3.2 10/10/2022 0852   LYMPHSABS 2.0 10/03/2014 0914   MONOABS 0.9 10/10/2022 0852   MONOABS 0.6 10/03/2014 0914   EOSABS 0.4 10/10/2022 0852   EOSABS 0.2 10/03/2014 0914   BASOSABS 0.1 10/10/2022 0852   BASOSABS 0.1 10/03/2014 0914     CMP     Component Value Date/Time   NA 139 10/10/2022 0852   NA 144 10/03/2014 0914   K 4.5 10/10/2022 0852   K 4.6 10/03/2014 0914   CL 110 10/10/2022 0852   CO2 23 10/10/2022 0852   CO2 29 10/03/2014 0914   GLUCOSE 171 (H) 10/10/2022 0852   GLUCOSE 133 10/03/2014 0914   BUN 25 (H) 10/10/2022 0852   BUN 17.8 10/03/2014 0914   CREATININE 0.84 10/10/2022 0852   CREATININE 0.8 10/03/2014 0914   CALCIUM 9.5 10/10/2022 0852   CALCIUM 9.7 10/03/2014 0914   PROT 6.8 10/10/2022 0852   PROT 7.0 10/03/2014 0914   ALBUMIN 4.1 10/10/2022 0852   ALBUMIN 3.7 10/03/2014 0914   AST 25 10/10/2022 0852   AST 18 10/03/2014 0914   ALT 21 10/10/2022 0852   ALT 13 10/03/2014 0914   ALKPHOS 146 (H) 10/10/2022 0852   ALKPHOS 74 10/03/2014 0914   BILITOT 0.8 10/10/2022 0852   BILITOT 0.98 10/03/2014 0914   GFRNONAA >60 10/10/2022 0852   GFRAA >60 01/30/2020 0429   GFRAA  >60 01/27/2020 0834     ASSESSMENT & PLAN:Kim Castro is a 77 y.o. female with    1. Right colon cancer, pT3N2aM0 stage IIB, MSS -Diagnosed in 08/2019 , staging CT chest showed multiple small lung nodules that were nonspecific but likely benign.  -S/p surgery with Dr Maisie Fus on 11/01/19. Path showed overall Stage IIIB cancer.  -S/p 6 months of adjuvant FOLFOX completed 05/2020.  -Due to rising CEA she underwent a PET scan 11/10/2021 which showed hypermetabolism and enlargement to the left ovary, no other evidence of metastatic disease  -S/p total hysterectomy 12/22/2021 by Dr. Pricilla Holm, path revealed metastatic moderately differentiated colonic adenocarcinoma involving both ovaries with peritoneal deposits also positive for metastatic mucinous adenocarcinoma consistent with colorectal primary -[facility] showed K-ras mutation, unfortunately she is not eligible for any targeted therapy -She restarted FOLFOX 01/24/2022 - 04/2022, stopped due to disease progression. Started FOLFIRI/Beva 04/2022 -restaging CT 10/2022 showed partial response and incidental PE; Beva was stopped, she continues FOLFIRI q2 weeks    3. Neuropathy, secondary to Oxaliplatin G2 -S/p C7 she started having numbness in hands, L>R which become prolonged with tingling s/p C10.  -Oxaliplatin dose reduced and ultimately held with C11-12.  -improved, controlled on Lyrica and B vit complex   4. H/o left breast DCIS, G2, ER/PR+ -Dx in 05/2014. S/p left lumpectomy with Dr Johna Sheriff, adjuvant RT with Dr [name], and adjuvant Anastrozole 08/2014-01/13/20 under the care of Dr Pamelia Hoit.  -DEXA 09/11/20 T score +1.2 normal.  -Mammogram 10/04/2021 was negative -No hypermetabolism in the breast on PET to suggest recurrence    5. Submassive bilateral pulmonary embolism, recurrent PE 10/2022 -S/p C3 chemo, her 01/27/20 CTA showed PE with right heart strain. Doppler negative for DVT.  -She was treated with 6 months of Eliquis -Recurrent PE on  restaging CT 10/2022, incidental; she was asymptomatic -Will continue Eliquis indefinitely    6. Comorbidities: Arthritis, DM, HTN, GERD, neck and back pain with sciatica -she switched to Dr. Veto Kemps for primary care in 05/2021. -Chronic back pain is stable, no new pain  PLAN:  No orders of the defined types were placed in this encounter.     All questions were answered. The patient knows to call the clinic with any problems, questions or concerns. No barriers to learning were detected. I spent *** counseling the patient face to face. The total time spent in the appointment was *** and more than 50% was on counseling, review of test results, and coordination of care.   Cira Rue, NP-C @DATE @

## 2022-10-24 ENCOUNTER — Inpatient Hospital Stay: Payer: Medicare Other

## 2022-10-24 ENCOUNTER — Encounter: Payer: Self-pay | Admitting: Nurse Practitioner

## 2022-10-24 ENCOUNTER — Ambulatory Visit: Payer: Medicare Other

## 2022-10-24 ENCOUNTER — Inpatient Hospital Stay (HOSPITAL_BASED_OUTPATIENT_CLINIC_OR_DEPARTMENT_OTHER): Payer: Medicare Other | Admitting: Nurse Practitioner

## 2022-10-24 VITALS — BP 142/50 | HR 72 | Temp 97.3°F | Resp 13 | Wt 212.9 lb

## 2022-10-24 VITALS — BP 152/79 | HR 60 | Temp 98.2°F | Resp 16

## 2022-10-24 DIAGNOSIS — C182 Malignant neoplasm of ascending colon: Secondary | ICD-10-CM

## 2022-10-24 DIAGNOSIS — I2699 Other pulmonary embolism without acute cor pulmonale: Secondary | ICD-10-CM | POA: Diagnosis not present

## 2022-10-24 DIAGNOSIS — R202 Paresthesia of skin: Secondary | ICD-10-CM | POA: Diagnosis not present

## 2022-10-24 DIAGNOSIS — C787 Secondary malignant neoplasm of liver and intrahepatic bile duct: Secondary | ICD-10-CM | POA: Diagnosis not present

## 2022-10-24 DIAGNOSIS — Z95828 Presence of other vascular implants and grafts: Secondary | ICD-10-CM

## 2022-10-24 DIAGNOSIS — C786 Secondary malignant neoplasm of retroperitoneum and peritoneum: Secondary | ICD-10-CM | POA: Diagnosis not present

## 2022-10-24 DIAGNOSIS — G620 Drug-induced polyneuropathy: Secondary | ICD-10-CM | POA: Diagnosis not present

## 2022-10-24 DIAGNOSIS — Z7901 Long term (current) use of anticoagulants: Secondary | ICD-10-CM | POA: Diagnosis not present

## 2022-10-24 DIAGNOSIS — R200 Anesthesia of skin: Secondary | ICD-10-CM

## 2022-10-24 DIAGNOSIS — C7963 Secondary malignant neoplasm of bilateral ovaries: Secondary | ICD-10-CM | POA: Diagnosis not present

## 2022-10-24 DIAGNOSIS — Z86000 Personal history of in-situ neoplasm of breast: Secondary | ICD-10-CM | POA: Diagnosis not present

## 2022-10-24 DIAGNOSIS — Z5111 Encounter for antineoplastic chemotherapy: Secondary | ICD-10-CM | POA: Diagnosis not present

## 2022-10-24 DIAGNOSIS — Z66 Do not resuscitate: Secondary | ICD-10-CM | POA: Diagnosis not present

## 2022-10-24 DIAGNOSIS — Z5189 Encounter for other specified aftercare: Secondary | ICD-10-CM | POA: Diagnosis not present

## 2022-10-24 LAB — CBC WITH DIFFERENTIAL (CANCER CENTER ONLY)
Abs Immature Granulocytes: 0.3 10*3/uL — ABNORMAL HIGH (ref 0.00–0.07)
Basophils Absolute: 0.1 10*3/uL (ref 0.0–0.1)
Basophils Relative: 1 %
Eosinophils Absolute: 0.4 10*3/uL (ref 0.0–0.5)
Eosinophils Relative: 3 %
HCT: 35 % — ABNORMAL LOW (ref 36.0–46.0)
Hemoglobin: 11 g/dL — ABNORMAL LOW (ref 12.0–15.0)
Immature Granulocytes: 2 %
Lymphocytes Relative: 23 %
Lymphs Abs: 3.1 10*3/uL (ref 0.7–4.0)
MCH: 32.3 pg (ref 26.0–34.0)
MCHC: 31.4 g/dL (ref 30.0–36.0)
MCV: 102.6 fL — ABNORMAL HIGH (ref 80.0–100.0)
Monocytes Absolute: 0.8 10*3/uL (ref 0.1–1.0)
Monocytes Relative: 6 %
Neutro Abs: 8.6 10*3/uL — ABNORMAL HIGH (ref 1.7–7.7)
Neutrophils Relative %: 65 %
Platelet Count: 197 10*3/uL (ref 150–400)
RBC: 3.41 MIL/uL — ABNORMAL LOW (ref 3.87–5.11)
RDW: 17.5 % — ABNORMAL HIGH (ref 11.5–15.5)
WBC Count: 13.2 10*3/uL — ABNORMAL HIGH (ref 4.0–10.5)
nRBC: 0 % (ref 0.0–0.2)

## 2022-10-24 LAB — CMP (CANCER CENTER ONLY)
ALT: 14 U/L (ref 0–44)
AST: 18 U/L (ref 15–41)
Albumin: 3.8 g/dL (ref 3.5–5.0)
Alkaline Phosphatase: 142 U/L — ABNORMAL HIGH (ref 38–126)
Anion gap: 8 (ref 5–15)
BUN: 16 mg/dL (ref 8–23)
CO2: 25 mmol/L (ref 22–32)
Calcium: 9 mg/dL (ref 8.9–10.3)
Chloride: 111 mmol/L (ref 98–111)
Creatinine: 0.79 mg/dL (ref 0.44–1.00)
GFR, Estimated: >60 mL/min (ref >60)
Glucose, Bld: 156 mg/dL — ABNORMAL HIGH (ref 70–99)
Potassium: 3.9 mmol/L (ref 3.5–5.1)
Sodium: 144 mmol/L (ref 135–145)
Total Bilirubin: 0.7 mg/dL (ref 0.3–1.2)
Total Protein: 6.2 g/dL — ABNORMAL LOW (ref 6.5–8.1)

## 2022-10-24 MED ORDER — SODIUM CHLORIDE 0.9 % IV SOLN: Freq: Once | INTRAVENOUS | Status: AC

## 2022-10-24 MED ORDER — PREGABALIN 50 MG PO CAPS: 50.0000 mg | ORAL_CAPSULE | Freq: Every day | ORAL | 2 refills | Status: DC

## 2022-10-24 MED ORDER — SODIUM CHLORIDE 0.9 % IV SOLN
100.0000 mg/m2 | Freq: Once | INTRAVENOUS | Status: AC
  Administered 2022-10-24: 220 mg via INTRAVENOUS
  Filled 2022-10-24: qty 11

## 2022-10-24 MED ORDER — SODIUM CHLORIDE 0.9% FLUSH: 10.0000 mL | INTRAVENOUS | Status: DC | PRN

## 2022-10-24 MED ORDER — SODIUM CHLORIDE 0.9% FLUSH
10.0000 mL | INTRAVENOUS | Status: DC | PRN
  Administered 2022-10-24: 10 mL

## 2022-10-24 MED ORDER — PREGABALIN 25 MG PO CAPS
25.0000 mg | ORAL_CAPSULE | Freq: Every morning | ORAL | 2 refills | Status: DC
Start: 2022-10-24 — End: 2022-12-19

## 2022-10-24 MED ORDER — ATROPINE SULFATE 1 MG/ML IV SOLN: 0.5000 mg | Freq: Once | INTRAVENOUS | Status: DC | PRN

## 2022-10-24 MED ORDER — SODIUM CHLORIDE 0.9 % IV SOLN
2400.0000 mg/m2 | INTRAVENOUS | Status: DC
  Administered 2022-10-24: 5000 mg via INTRAVENOUS
  Filled 2022-10-24: qty 100

## 2022-10-24 MED ORDER — SODIUM CHLORIDE 0.9 % IV SOLN
10.0000 mg | Freq: Once | INTRAVENOUS | Status: AC
  Administered 2022-10-24: 10 mg via INTRAVENOUS
  Filled 2022-10-24: qty 10

## 2022-10-24 MED ORDER — SODIUM CHLORIDE 0.9 % IV SOLN
400.0000 mg/m2 | Freq: Once | INTRAVENOUS | Status: AC
  Administered 2022-10-24: 896 mg via INTRAVENOUS
  Filled 2022-10-24: qty 44.8

## 2022-10-24 MED ORDER — HEPARIN SOD (PORK) LOCK FLUSH 100 UNIT/ML IV SOLN: 500.0000 [IU] | Freq: Once | INTRAVENOUS | Status: DC | PRN

## 2022-10-24 MED ORDER — PALONOSETRON HCL INJECTION 0.25 MG/5ML
0.2500 mg | Freq: Once | INTRAVENOUS | Status: AC
  Administered 2022-10-24: 0.25 mg via INTRAVENOUS
  Filled 2022-10-24: qty 5

## 2022-10-24 MED ORDER — APIXABAN 5 MG PO TABS: 5.0000 mg | ORAL_TABLET | Freq: Two times a day (BID) | ORAL | 2 refills | Status: DC

## 2022-10-24 NOTE — Patient Instructions (Signed)
[facility]  Discharge Instructions: Thank you for choosing [facility] to provide your oncology and hematology care.   If you have a lab appointment with the Cancer Center, please go directly to the Cancer Center and check in at the registration area.   Wear comfortable clothing and clothing appropriate for easy access to any Portacath or PICC line.   We strive to give you quality time with your provider. You may need to reschedule your appointment if you arrive late (15 or more minutes).  Arriving late affects you and other patients whose appointments are after yours.  Also, if you miss three or more appointments without notifying the office, you may be dismissed from the clinic at the provider's discretion.      For prescription refill requests, have your pharmacy contact our office and allow 72 hours for refills to be completed.    Today you received the following chemotherapy and/or immunotherapy agents: irinotecan, leucovorin, fluorouracil      To help prevent nausea and vomiting after your treatment, we encourage you to take your nausea medication as directed.  BELOW ARE SYMPTOMS THAT SHOULD BE REPORTED IMMEDIATELY: *FEVER GREATER THAN 100.4 F (38 C) OR HIGHER *CHILLS OR SWEATING *NAUSEA AND VOMITING THAT IS NOT CONTROLLED WITH YOUR NAUSEA MEDICATION *UNUSUAL SHORTNESS OF BREATH *UNUSUAL BRUISING OR BLEEDING *URINARY PROBLEMS (pain or burning when urinating, or frequent urination) *BOWEL PROBLEMS (unusual diarrhea, constipation, pain near the anus) TENDERNESS IN MOUTH AND THROAT WITH OR WITHOUT PRESENCE OF ULCERS (sore throat, sores in mouth, or a toothache) UNUSUAL RASH, SWELLING OR PAIN  UNUSUAL VAGINAL DISCHARGE OR ITCHING   Items with * indicate a potential emergency and should be followed up as soon as possible or go to the Emergency Department if any problems should occur.  Please show the CHEMOTHERAPY ALERT CARD or  IMMUNOTHERAPY ALERT CARD at check-in to the Emergency Department and triage nurse.  Should you have questions after your visit or need to cancel or reschedule your appointment, please contact [facility] [facility]  Dept: [phone]  and follow the prompts.  Office hours are 8:00 a.m. to 4:30 p.m. Monday - Friday. Please note that voicemails left after 4:00 p.m. may not be returned until the following business day.  We are closed weekends and major holidays. You have access to a nurse at all times for urgent questions. Please call the main number to the clinic Dept: [phone] and follow the prompts.   For any non-urgent questions, you may also contact your provider using MyChart. We now offer e-Visits for anyone 41 and older to request care online for non-urgent symptoms. For details visit [email or web address].   Also download the MyChart app! Go to the app store, search "MyChart", open the app, select [place], and log in with your MyChart username and password.

## 2022-10-26 ENCOUNTER — Inpatient Hospital Stay: Payer: Medicare Other

## 2022-10-26 ENCOUNTER — Other Ambulatory Visit: Payer: Self-pay

## 2022-10-26 VITALS — BP 122/50 | HR 62 | Temp 98.6°F | Resp 18

## 2022-10-26 DIAGNOSIS — Z5189 Encounter for other specified aftercare: Secondary | ICD-10-CM | POA: Diagnosis not present

## 2022-10-26 DIAGNOSIS — Z66 Do not resuscitate: Secondary | ICD-10-CM | POA: Diagnosis not present

## 2022-10-26 DIAGNOSIS — Z86000 Personal history of in-situ neoplasm of breast: Secondary | ICD-10-CM | POA: Diagnosis not present

## 2022-10-26 DIAGNOSIS — Z7901 Long term (current) use of anticoagulants: Secondary | ICD-10-CM | POA: Diagnosis not present

## 2022-10-26 DIAGNOSIS — I2699 Other pulmonary embolism without acute cor pulmonale: Secondary | ICD-10-CM | POA: Diagnosis not present

## 2022-10-26 DIAGNOSIS — Z5111 Encounter for antineoplastic chemotherapy: Secondary | ICD-10-CM | POA: Diagnosis not present

## 2022-10-26 DIAGNOSIS — C182 Malignant neoplasm of ascending colon: Secondary | ICD-10-CM

## 2022-10-26 DIAGNOSIS — C786 Secondary malignant neoplasm of retroperitoneum and peritoneum: Secondary | ICD-10-CM | POA: Diagnosis not present

## 2022-10-26 DIAGNOSIS — C787 Secondary malignant neoplasm of liver and intrahepatic bile duct: Secondary | ICD-10-CM | POA: Diagnosis not present

## 2022-10-26 DIAGNOSIS — C7963 Secondary malignant neoplasm of bilateral ovaries: Secondary | ICD-10-CM | POA: Diagnosis not present

## 2022-10-26 DIAGNOSIS — G620 Drug-induced polyneuropathy: Secondary | ICD-10-CM | POA: Diagnosis not present

## 2022-10-26 MED ORDER — HEPARIN SOD (PORK) LOCK FLUSH 100 UNIT/ML IV SOLN
500.0000 [IU] | Freq: Once | INTRAVENOUS | Status: AC | PRN
  Administered 2022-10-26: 500 [IU]

## 2022-10-26 MED ORDER — SODIUM CHLORIDE 0.9% FLUSH
10.0000 mL | INTRAVENOUS | Status: DC | PRN
  Administered 2022-10-26: 10 mL

## 2022-10-26 MED ORDER — PEGFILGRASTIM-CBQV 6 MG/0.6ML ~~LOC~~ SOSY
6.0000 mg | PREFILLED_SYRINGE | Freq: Once | SUBCUTANEOUS | Status: AC
  Administered 2022-10-26: 6 mg via SUBCUTANEOUS
  Filled 2022-10-26: qty 0.6

## 2022-10-27 ENCOUNTER — Ambulatory Visit
Admission: RE | Admit: 2022-10-27 | Discharge: 2022-10-27 | Disposition: A | Discharge status: Home / Self Care | Payer: Medicare Other | Source: Ambulatory Visit | Attending: Hematology | Admitting: Hematology

## 2022-10-27 DIAGNOSIS — Z1231 Encounter for screening mammogram for malignant neoplasm of breast: Secondary | ICD-10-CM

## 2022-11-04 ENCOUNTER — Ambulatory Visit (INDEPENDENT_AMBULATORY_CARE_PROVIDER_SITE_OTHER): Payer: Medicare Other

## 2022-11-04 VITALS — BP 118/58 | HR 72 | Temp 98.0°F | Ht 65.5 in | Wt 209.2 lb

## 2022-11-04 DIAGNOSIS — Z0000 Encounter for general adult medical examination without abnormal findings: Secondary | ICD-10-CM | POA: Diagnosis not present

## 2022-11-04 MED FILL — Dexamethasone Sodium Phosphate Inj 100 MG/10ML: INTRAMUSCULAR | Qty: 1 | Status: AC

## 2022-11-04 NOTE — Patient Instructions (Signed)
Kim Castro , Thank you for taking time to come for your Medicare Wellness Visit. I appreciate your ongoing commitment to your health goals. Please review the following plan we discussed and let me know if I can assist you in the future.   These are the goals we discussed:  Goals      Patient Stated     11/04/2022, wants to get rid of cancer        This is a list of the screening recommended for you and due dates:  Health Maintenance  Topic Date Due   COVID-19 Vaccine (4 - 2023-24 season) 12/31/2021   Eye exam for diabetics  09/07/2022   Complete foot exam   10/27/2022   Flu Shot  12/01/2022   Hemoglobin A1C  02/15/2023   Yearly kidney health urinalysis for diabetes  05/13/2023   Yearly kidney function blood test for diabetes  10/24/2023   Medicare Annual Wellness Visit  11/04/2023   Colon Cancer Screening  02/11/2024   Mammogram  10/26/2024   DTaP/Tdap/Td vaccine (3 - Td or Tdap) 07/10/2028   Pneumonia Vaccine  Completed   DEXA scan (bone density measurement)  Completed   Hepatitis C Screening  Completed   Zoster (Shingles) Vaccine  Completed   HPV Vaccine  Aged Out    Advanced directives: copy in chart  Conditions/risks identified: none  Next appointment: Follow up in one year for your annual wellness visit    Preventive Care 65 Years and Older, Female Preventive care refers to lifestyle choices and visits with your health care provider that can promote health and wellness. What does preventive care include? A yearly physical exam. This is also called an annual well check. Dental exams once or twice a year. Routine eye exams. Ask your health care provider how often you should have your eyes checked. Personal lifestyle choices, including: Daily care of your teeth and gums. Regular physical activity. Eating a healthy diet. Avoiding tobacco and drug use. Limiting alcohol use. Practicing safe sex. Taking low-dose aspirin every day. Taking vitamin and mineral  supplements as recommended by your health care provider. What happens during an annual well check? The services and screenings done by your health care provider during your annual well check will depend on your age, overall health, lifestyle risk factors, and family history of disease. Counseling  Your health care provider may ask you questions about your: Alcohol use. Tobacco use. Drug use. Emotional well-being. Home and relationship well-being. Sexual activity. Eating habits. History of falls. Memory and ability to understand (cognition). Work and work Astronomer. Reproductive health. Screening  You may have the following tests or measurements: Height, weight, and BMI. Blood pressure. Lipid and cholesterol levels. These may be checked every 5 years, or more frequently if you are over 73 years old. Skin check. Lung cancer screening. You may have this screening every year starting at age 10 if you have a 30-pack-year history of smoking and currently smoke or have quit within the past 15 years. Fecal occult blood test (FOBT) of the stool. You may have this test every year starting at age 46. Flexible sigmoidoscopy or colonoscopy. You may have a sigmoidoscopy every 5 years or a colonoscopy every 10 years starting at age 60. Hepatitis C blood test. Hepatitis B blood test. Sexually transmitted disease (STD) testing. Diabetes screening. This is done by checking your blood sugar (glucose) after you have not eaten for a while (fasting). You may have this done every 1-3 years. Bone density scan.  This is done to screen for osteoporosis. You may have this done starting at age 40. Mammogram. This may be done every 1-2 years. Talk to your health care provider about how often you should have regular mammograms. Talk with your health care provider about your test results, treatment options, and if necessary, the need for more tests. Vaccines  Your health care provider may recommend certain  vaccines, such as: Influenza vaccine. This is recommended every year. Tetanus, diphtheria, and acellular pertussis (Tdap, Td) vaccine. You may need a Td booster every 10 years. Zoster vaccine. You may need this after age 49. Pneumococcal 13-valent conjugate (PCV13) vaccine. One dose is recommended after age 18. Pneumococcal polysaccharide (PPSV23) vaccine. One dose is recommended after age 49. Talk to your health care provider about which screenings and vaccines you need and how often you need them. This information is not intended to replace advice given to you by your health care provider. Make sure you discuss any questions you have with your health care provider. Document Released: 05/15/2015 Document Revised: 01/06/2016 Document Reviewed: 02/17/2015 Elsevier Interactive Patient Education  2017 [employer].  Fall Prevention in the Home Falls can cause injuries. They can happen to people of all ages. There are many things you can do to make your home safe and to help prevent falls. What can I do on the outside of my home? Regularly fix the edges of walkways and driveways and fix any cracks. Remove anything that might make you trip as you walk through a door, such as a raised step or threshold. Trim any bushes or trees on the path to your home. Use bright outdoor lighting. Clear any walking paths of anything that might make someone trip, such as rocks or tools. Regularly check to see if handrails are loose or broken. Make sure that both sides of any steps have handrails. Any raised decks and porches should have guardrails on the edges. Have any leaves, snow, or ice cleared regularly. Use sand or salt on walking paths during winter. Clean up any spills in your garage right away. This includes oil or grease spills. What can I do in the bathroom? Use night lights. Install grab bars by the toilet and in the tub and shower. Do not use towel bars as grab bars. Use non-skid mats or decals in  the tub or shower. If you need to sit down in the shower, use a plastic, non-slip stool. Keep the floor dry. Clean up any water that spills on the floor as soon as it happens. Remove soap buildup in the tub or shower regularly. Attach bath mats securely with double-sided non-slip rug tape. Do not have throw rugs and other things on the floor that can make you trip. What can I do in the bedroom? Use night lights. Make sure that you have a light by your bed that is easy to reach. Do not use any sheets or blankets that are too big for your bed. They should not hang down onto the floor. Have a firm chair that has side arms. You can use this for support while you get dressed. Do not have throw rugs and other things on the floor that can make you trip. What can I do in the kitchen? Clean up any spills right away. Avoid walking on wet floors. Keep items that you use a lot in easy-to-reach places. If you need to reach something above you, use a strong step stool that has a grab bar. Keep electrical cords  out of the way. Do not use floor polish or wax that makes floors slippery. If you must use wax, use non-skid floor wax. Do not have throw rugs and other things on the floor that can make you trip. What can I do with my stairs? Do not leave any items on the stairs. Make sure that there are handrails on both sides of the stairs and use them. Fix handrails that are broken or loose. Make sure that handrails are as long as the stairways. Check any carpeting to make sure that it is firmly attached to the stairs. Fix any carpet that is loose or worn. Avoid having throw rugs at the top or bottom of the stairs. If you do have throw rugs, attach them to the floor with carpet tape. Make sure that you have a light switch at the top of the stairs and the bottom of the stairs. If you do not have them, ask someone to add them for you. What else can I do to help prevent falls? Wear shoes that: Do not have high  heels. Have rubber bottoms. Are comfortable and fit you well. Are closed at the toe. Do not wear sandals. If you use a stepladder: Make sure that it is fully opened. Do not climb a closed stepladder. Make sure that both sides of the stepladder are locked into place. Ask someone to hold it for you, if possible. Clearly mark and make sure that you can see: Any grab bars or handrails. First and last steps. Where the edge of each step is. Use tools that help you move around (mobility aids) if they are needed. These include: Canes. Walkers. Scooters. Crutches. Turn on the lights when you go into a dark area. Replace any light bulbs as soon as they burn out. Set up your furniture so you have a clear path. Avoid moving your furniture around. If any of your floors are uneven, fix them. If there are any pets around you, be aware of where they are. Review your medicines with your doctor. Some medicines can make you feel dizzy. This can increase your chance of falling. Ask your doctor what other things that you can do to help prevent falls. This information is not intended to replace advice given to you by your health care provider. Make sure you discuss any questions you have with your health care provider. Document Released: 02/12/2009 Document Revised: 09/24/2015 Document Reviewed: 05/23/2014 Elsevier Interactive Patient Education  2017 [employer].

## 2022-11-04 NOTE — Progress Notes (Signed)
Subjective:   Kim Castro is a 77 y.o. female who presents for Medicare Annual (Subsequent) preventive examination.  Visit Complete: In person  Patient Medicare AWV questionnaire was completed by the patient on 10/30/2022; I have confirmed that all information answered by patient is correct and no changes since this date.  Review of Systems     Cardiac Risk Factors include: advanced age (>71men, >61 women);diabetes mellitus;dyslipidemia;hypertension;obesity (BMI >30kg/m2)     Objective:    Today's Vitals   11/04/22 0854  BP: (!) 118/58  Pulse: 72  Temp: 98 F (36.7 C)  TempSrc: Oral  SpO2: 96%  Weight: 209 lb 3.2 oz (94.9 kg)  Height: 5' 5.5" (1.664 m)  PainSc: 3    Body mass index is 34.28 kg/m.     11/04/2022    9:01 AM 10/02/2022    7:30 PM 08/22/2022   10:43 AM 12/23/2021    2:45 AM 12/22/2021   10:20 PM 12/09/2021    8:03 AM 11/26/2021    9:39 AM  Advanced Directives  Does Patient Have a Medical Advance Directive? Yes No No  No No No  Type of Estate agent of [place];Living will        Does patient want to make changes to medical advance directive?  No - Patient declined No - Patient declined   No - Patient declined No - Patient declined  Copy of Healthcare Power of Attorney in Chart? Yes - validated most recent copy scanned in chart (See row information)        Would patient like information on creating a medical advance directive?  No - Patient declined  No - Patient declined       Current Medications (verified) Outpatient Encounter Medications as of 11/04/2022  Medication Sig   apixaban (ELIQUIS) 5 MG TABS tablet Take 1 tablet (5 mg total) by mouth 2 (two) times daily.   b complex vitamins capsule Take 1 capsule by mouth daily.   Cholecalciferol (VITAMIN D) 2000 UNITS CAPS Take 2,000 Units by mouth daily.    ibuprofen (ADVIL) 200 MG tablet Take 200 mg by mouth daily as needed (pain).   loratadine (CLARITIN) 10 MG tablet Take 10 mg by  mouth daily as needed for allergies.   losartan (COZAAR) 25 MG tablet Take 1 tablet (25 mg total) by mouth daily.   metFORMIN (GLUCOPHAGE) 500 MG tablet Take 1 tablet (500 mg total) by mouth in the morning and at bedtime.   Multiple Vitamins-Minerals (MULTIVITAMIN WITH MINERALS) tablet Take 1 tablet by mouth daily.   omeprazole (PRILOSEC) 20 MG capsule Take 20 mg by mouth daily before breakfast.    ondansetron (ZOFRAN) 8 MG tablet Take 1 tablet (8 mg total) by mouth every 8 (eight) hours as needed for nausea or vomiting. Start on the third day after chemotherapy.   potassium chloride (KLOR-CON) 10 MEQ tablet Take 1 tablet by mouth once daily   pravastatin (PRAVACHOL) 10 MG tablet Take 1 tablet (10 mg total) by mouth daily.   pregabalin (LYRICA) 25 MG capsule Take 1 capsule (25 mg total) by mouth every morning. Take in addition to 50 mg at bedtime   pregabalin (LYRICA) 50 MG capsule Take 1 capsule (50 mg total) by mouth at bedtime. Take in addition to 25 mg in the morning   prochlorperazine (COMPAZINE) 10 MG tablet Take 1 tablet (10 mg total) by mouth every 6 (six) hours as needed for nausea or vomiting.   triamcinolone ointment (KENALOG) 0.5 %  Apply 1 Application topically 2 (two) times daily.   No facility-administered encounter medications on file as of 11/04/2022.    Allergies (verified) Oxycodone   History: Past Medical History:  Diagnosis Date   Aortic atherosclerosis (HCC)    Arthritis    feet, lower back   Basal cell carcinoma    arm   Breast cancer of upper-outer quadrant of left female breast (HCC) 06/04/2014   Cataract    immature on the left   Colon cancer (HCC) 08/2019   Diabetes mellitus without complication (HCC)    Diverticulosis    Dizziness    > 51yrs ago;took Antivert    Family history of anesthesia complication    sister slow to wake up with anesthesia   Family history of breast cancer    Family history of colon cancer    Family history of uterine cancer     GERD (gastroesophageal reflux disease)    takes occasional TUMs   History of bronchitis    > 92yrs ago   History of colon polyps    History of hiatal hernia    Small noted on CT   History of pulmonary embolus (PE)    Hypertension    takes Losartan daily and HCTZ   Iron deficiency anemia    Joint pain    Numbness    to toes on each foot   Peripheral neuropathy    feet and toes   Personal history of radiation therapy    Pulmonary nodules    Noted on CT   Radiation 07/31/14-08/28/14   Left Breast 20 fxs   Seasonal allergies    takes Claritin prn   Urinary frequency    Vitamin D deficiency    takes VIt D daily   Past Surgical History:  Procedure Laterality Date   BREAST BIOPSY Bilateral    BREAST LUMPECTOMY Left    BREAST LUMPECTOMY WITH RADIOACTIVE SEED LOCALIZATION Left 07/01/2014   Procedure: LEFT BREAST LUMPECTOMY WITH RADIOACTIVE SEED LOCALIZATION;  Surgeon: Glenna Fellows, MD;  Location: [facility];  Service: General;  Laterality: Left;   CATARACT EXTRACTION Right    COLONOSCOPY  09/24/2019   [place]   LAPAROSCOPIC PARTIAL COLECTOMY N/A 11/01/2019   Procedure: LAPAROSCOPIC PARTIAL COLECTOMY;  Surgeon: [name], MD;  Location: [facility];  Service: General;  Laterality: N/A;   POLYPECTOMY     PORTACATH PLACEMENT N/A 12/10/2019   Procedure: INSERTION PORT-A-CATH ULTRASOUND GUIDED IN RIGHT IJ;  Surgeon: [name], MD;  Location: [facility];  Service: General;  Laterality: N/A;   ROBOTIC ASSISTED TOTAL HYSTERECTOMY Bilateral 12/22/2021   Procedure: XI ROBOTIC ASSISTED TOTAL HYSTERECTOMY WITH BILATERAL SALPINGO OOPHORECTOMY;  Surgeon: Carver Fila, MD;  Location: [facility];  Service: Gynecology;  Laterality: Bilateral;   TOTAL KNEE ARTHROPLASTY Left 10/24/2012   Procedure: TOTAL KNEE ARTHROPLASTY;  Surgeon: Nestor Lewandowsky, MD;  Location: [facility];  Service: Orthopedics;  Laterality: Left;   TOTAL KNEE ARTHROPLASTY Right 01/09/2013   Procedure: TOTAL KNEE  ARTHROPLASTY;  Surgeon: Nestor Lewandowsky, MD;  Location: [facility];  Service: Orthopedics;  Laterality: Right;   TUBAL LIGATION     Family History  Problem Relation Age of Onset   Diabetes Mother    Uterine cancer Mother        deceased 74   Hypertension Father    Heart disease Father    Diabetes Sister    Breast cancer Sister 79       currently 94   Hypertension  Sister    Diabetes Sister    Breast cancer Sister    Diabetes Sister    Kidney disease Sister    Heart disease Sister    Lupus Sister    Heart disease Sister    Diabetes Sister    Breast cancer Sister    Diabetes Sister    Breast cancer Daughter    Colon polyps Daughter    Thyroid cancer Daughter 12       currently 61; type?   Cancer Maternal Uncle        unk. primary; deceased 48s   Stroke Maternal Grandmother    Hypertension Brother    Diabetes Brother    Colon cancer Brother 89   Esophageal cancer Neg Hx    Rectal cancer Neg Hx    Stomach cancer Neg Hx    Social History   Socioeconomic History   Marital status: Married    Spouse name: Not on file   Number of children: 3   Years of education: Not on file   Highest education level: Not on file  Occupational History   Occupation: Retired    Comment: Designer, television/film set  Tobacco Use   Smoking status: Never   Smokeless tobacco: Never  Vaping Use   Vaping Use: Never used  Substance and Sexual Activity   Alcohol use: No   Drug use: No   Sexual activity: Yes    Birth control/protection: Surgical, Post-menopausal  Other Topics Concern   Not on file  Social History Narrative   Not on file   Social Determinants of Health   Financial Resource Strain: Low Risk  (10/30/2022)   Overall Financial Resource Strain (CARDIA)    Difficulty of Paying Living Expenses: Not hard at all  Food Insecurity: No Food Insecurity (10/30/2022)   Hunger Vital Sign    Worried About Running Out of Food in the Last Year: Never true    Ran Out of Food in the Last Year: Never true   Transportation Needs: No Transportation Needs (10/30/2022)   PRAPARE - Administrator, Civil Service (Medical): No    Lack of Transportation (Non-Medical): No  Physical Activity: Sufficiently Active (10/30/2022)   Exercise Vital Sign    Days of Exercise per Week: 2 days    Minutes of Exercise per Session: 100 min  Stress: No Stress Concern Present (10/30/2022)   [employer] of Occupational Health - Occupational Stress Questionnaire    Feeling of Stress : Not at all  Social Connections: Unknown (10/30/2022)   Social Connection and Isolation Panel [NHANES]    Frequency of Communication with Friends and Family: More than three times a week    Frequency of Social Gatherings with Friends and Family: Twice a week    Attends Religious Services: Not on Marketing executive or Organizations: No    Attends Engineer, structural: Not on file    Marital Status: Married    Tobacco Counseling Counseling given: Not Answered   Clinical Intake:  Pre-visit preparation completed: Yes  Pain : 0-10 Pain Score: 3  Pain Type: Chronic pain Pain Location: Back Pain Orientation: Lower Pain Descriptors / Indicators: Aching Pain Onset: More than a month ago Pain Frequency: Constant     Nutritional Status: BMI > 30  Obese Nutritional Risks: Nausea/ vomitting/ diarrhea (due to chemo. immodium helps) Diabetes: Yes CBG done?: No Did pt. bring in CBG monitor from home?: No  How often do you need to  have someone help you when you read instructions, pamphlets, or other written materials from your doctor or pharmacy?: 1 - Never  Interpreter Needed?: No  Information entered by :: NAllen LPN   Activities of Daily Living    10/30/2022    7:35 AM 10/02/2022    7:30 PM  In your present state of health, do you have any difficulty performing the following activities:  Hearing? 0 0  Vision? 0 0  Difficulty concentrating or making decisions? 0 0  Walking or climbing  stairs? 0 0  Dressing or bathing? 0 0  Doing errands, shopping? 0 0  Preparing Food and eating ? N   Using the Toilet? N   In the past six months, have you accidently leaked urine? N   Do you have problems with loss of bowel control? N   Managing your Medications? N   Managing your Finances? N   Housekeeping or managing your Housekeeping? N     Patient Care Team: [name], MD as PCP - General (Family Medicine) [name], MD as Consulting Physician (General Surgery) [name], MD as Consulting Physician (Hematology) [name], MD as Consulting Physician (General Surgery) [name], MD as Consulting Physician (Hematology and Oncology)  Indicate any recent Medical Services you may have received from other than [facility] providers in the past year (date may be approximate).     Assessment:   This is a routine wellness examination for [facility].  Hearing/Vision screen Hearing Screening - Comments:: Denies hearing issues Vision Screening - Comments:: Regular eye exams, [facility]  Dietary issues and exercise activities discussed:     Goals Addressed             This Visit's Progress    Patient Stated       11/04/2022, wants to get rid of cancer       Depression Screen    11/04/2022    9:02 AM 09/01/2021    9:29 AM 09/01/2021    9:23 AM 09/01/2021    9:22 AM 05/12/2021    1:03 PM 06/19/2014    1:57 PM  PHQ 2/9 Scores  PHQ - 2 Score 0 0 0 0 0 0  PHQ- 9 Score 1         Fall Risk    10/30/2022    7:35 AM 09/01/2021    9:23 AM 05/12/2021    1:03 PM 07/17/2014    9:50 AM 06/19/2014    1:57 PM  Fall Risk   Falls in the past year? 0 0 1 No No  Number falls in past yr: 0 0 0    Injury with Fall? 0 0 0    Risk for fall due to : Medication side effect  No Fall Risks    Follow up Falls prevention discussed;Falls evaluation completed Falls evaluation completed Falls evaluation completed      MEDICARE RISK AT HOME:   TIMED UP AND GO:  Was the test  performed?  Yes  Length of time to ambulate 10 feet: 6 sec Gait slow and steady with assistive device    Cognitive Function:        11/04/2022    9:03 AM  6CIT Screen  What Year? 0 points  What month? 0 points  What time? 0 points  Count back from 20 0 points  Months in reverse 0 points  Repeat phrase 0 points  Total Score 0 points    Immunizations Immunization History  Administered Date(s) Administered  Fluad Quad(high Dose 65+) 01/28/2020, 02/09/2022   Influenza, High Dose Seasonal PF 05/14/2015, 06/14/2017   Influenza,inj,Quad PF,6-35 Mos 05/27/2016   Influenza-Unspecified 03/17/2021   PFIZER(Purple Top)SARS-COV-2 Vaccination 06/25/2019, 07/16/2019, 01/08/2020   Pneumococcal Conjugate-13 05/07/2014   Pneumococcal Polysaccharide-23 04/12/2013   Td 07/11/2018   Tdap 08/15/2007   Zoster Recombinant(Shingrix) 06/12/2021, 09/14/2021   Zoster, Live 03/16/2011    TDAP status: Up to date  Flu Vaccine status: Up to date  Pneumococcal vaccine status: Up to date  Covid-19 vaccine status: Information provided on how to obtain vaccines.   Qualifies for Shingles Vaccine? Yes   Zostavax completed Yes   Shingrix Completed?: Yes  Screening Tests Health Maintenance  Topic Date Due   COVID-19 Vaccine (4 - 2023-24 season) 12/31/2021   OPHTHALMOLOGY EXAM  09/07/2022   FOOT EXAM  10/27/2022   INFLUENZA VACCINE  12/01/2022   HEMOGLOBIN A1C  02/15/2023   Diabetic kidney evaluation - Urine ACR  05/13/2023   Diabetic kidney evaluation - eGFR measurement  10/24/2023   Medicare Annual Wellness (AWV)  11/04/2023   Colonoscopy  02/11/2024   MAMMOGRAM  10/26/2024   DTaP/Tdap/Td (3 - Td or Tdap) 07/10/2028   Pneumonia Vaccine 38+ Years old  Completed   DEXA SCAN  Completed   Hepatitis C Screening  Completed   Zoster Vaccines- Shingrix  Completed   HPV VACCINES  Aged Out    Health Maintenance  Health Maintenance Due  Topic Date Due   COVID-19 Vaccine (4 - 2023-24 season)  12/31/2021   OPHTHALMOLOGY EXAM  09/07/2022   FOOT EXAM  10/27/2022    Colorectal cancer screening: Type of screening: Colonoscopy. Completed 02/10/2021. Repeat every 3 years  Mammogram status: Completed 10/27/2022. Repeat every year  Bone Density status: Completed 10/26/2021.   Lung Cancer Screening: (Low Dose CT Chest recommended if Age 43-80 years, 20 pack-year currently smoking OR have quit w/in 15years.) does not qualify.   Lung Cancer Screening Referral: no  Additional Screening:  Hepatitis C Screening: does qualify; Completed 05/12/2021  Vision Screening: Recommended annual ophthalmology exams for early detection of glaucoma and other disorders of the eye. Is the patient up to date with their annual eye exam?  Yes  Who is the provider or what is the name of the office in which the patient attends annual eye exams? [facility] If pt is not established with a provider, would they like to be referred to a provider to establish care? No .   Dental Screening: Recommended annual dental exams for proper oral hygiene  Diabetic Foot Exam: Diabetic Foot Exam: Completed 10/26/2021  Community Resource Referral / Chronic Care Management: CRR required this visit?  No   CCM required this visit?  No     Plan:     I have personally reviewed and noted the following in the patient's chart:   Medical and social history Use of alcohol, tobacco or illicit drugs  Current medications and supplements including opioid prescriptions. Patient is not currently taking opioid prescriptions. Functional ability and status Nutritional status Physical activity Advanced directives List of other physicians Hospitalizations, surgeries, and ER visits in previous 12 months Vitals Screenings to include cognitive, depression, and falls Referrals and appointments  In addition, I have reviewed and discussed with patient certain preventive protocols, quality metrics, and best practice recommendations. A  written personalized care plan for preventive services as well as general preventive health recommendations were provided to patient.     Barb Merino, LPN   05/07/1094  After Visit Summary: in person  Nurse Notes: none

## 2022-11-05 ENCOUNTER — Other Ambulatory Visit: Payer: Self-pay

## 2022-11-06 NOTE — Assessment & Plan Note (Signed)
-  incidental findings of bilateral distal PEs on restaging CT 10/02/2022, pt was asymptomatic -doppler of LEs (-) DVT -continue Eliquis indefinitely  

## 2022-11-06 NOTE — Assessment & Plan Note (Signed)
-  Dx in 05/2014, s/p left lumpectomy with Dr Johna Sheriff, adjuvant RT with Dr [name], and Anastrozole 08/2014 - 01/13/20 under Dr Pamelia Hoit.  -DEXA 09/11/20 T score +1.2 normal.  -most recent mammogram on 10/31/2022 was negative.

## 2022-11-06 NOTE — Assessment & Plan Note (Signed)
-  Secondary to oxaliplatin -She is on Lyrica and B complex, continue to monitor.  Overall stable.   

## 2022-11-06 NOTE — Assessment & Plan Note (Signed)
-  The patient understands the goal of care is palliative.  -pt has agreed with DNR, order placed.  

## 2022-11-06 NOTE — Assessment & Plan Note (Signed)
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 Path showed overall Stage IIIB cancer.  -s/p 6 months adjuvant FOLFOX, completed 06/01/20, oxaliplatin held for final 2 cycles due to neuropathy.  -s/p BSO and hysterectomy on 12/22/21 with Dr. Pricilla Holm. Path revealed metastatic moderately differentiated colonic adenocarcinoma involving both ovaries and peritoneum -she restarted FOLFOX on 01/24/22. -Due to disease progression, chemotherapy changed to second line FOLFIRI and bevacizumab on April 06, 2022. She has been tolerating moderately well, better now with dose reduction  -Restaging CT scan from June 23, 2022 showed partial response in liver and peritoneal metastasis, no other new lesions.  I reviewed the images and discussed the results with patient.  She is pleased. -She has been tolerating chemotherapy well overall due to dose reduction.  We discussed maintenance therapy option in future

## 2022-11-07 ENCOUNTER — Inpatient Hospital Stay: Payer: Medicare Other

## 2022-11-07 ENCOUNTER — Other Ambulatory Visit: Payer: Self-pay

## 2022-11-07 ENCOUNTER — Inpatient Hospital Stay: Payer: Medicare Other | Admitting: Hematology

## 2022-11-07 ENCOUNTER — Inpatient Hospital Stay: Payer: Medicare Other | Attending: Hematology

## 2022-11-07 ENCOUNTER — Encounter: Payer: Self-pay | Admitting: Hematology

## 2022-11-07 VITALS — BP 119/46 | HR 63 | Temp 98.0°F | Resp 16 | Ht 65.5 in | Wt 213.4 lb

## 2022-11-07 DIAGNOSIS — C182 Malignant neoplasm of ascending colon: Secondary | ICD-10-CM | POA: Insufficient documentation

## 2022-11-07 DIAGNOSIS — G620 Drug-induced polyneuropathy: Secondary | ICD-10-CM | POA: Diagnosis not present

## 2022-11-07 DIAGNOSIS — I2694 Multiple subsegmental pulmonary emboli without acute cor pulmonale: Secondary | ICD-10-CM

## 2022-11-07 DIAGNOSIS — C7963 Secondary malignant neoplasm of bilateral ovaries: Secondary | ICD-10-CM

## 2022-11-07 DIAGNOSIS — Z853 Personal history of malignant neoplasm of breast: Secondary | ICD-10-CM

## 2022-11-07 DIAGNOSIS — C787 Secondary malignant neoplasm of liver and intrahepatic bile duct: Secondary | ICD-10-CM

## 2022-11-07 DIAGNOSIS — Z90710 Acquired absence of both cervix and uterus: Secondary | ICD-10-CM | POA: Diagnosis not present

## 2022-11-07 DIAGNOSIS — C786 Secondary malignant neoplasm of retroperitoneum and peritoneum: Secondary | ICD-10-CM

## 2022-11-07 DIAGNOSIS — Z5111 Encounter for antineoplastic chemotherapy: Secondary | ICD-10-CM | POA: Diagnosis not present

## 2022-11-07 DIAGNOSIS — Z95828 Presence of other vascular implants and grafts: Secondary | ICD-10-CM

## 2022-11-07 DIAGNOSIS — Z5189 Encounter for other specified aftercare: Secondary | ICD-10-CM | POA: Insufficient documentation

## 2022-11-07 DIAGNOSIS — Z66 Do not resuscitate: Secondary | ICD-10-CM | POA: Insufficient documentation

## 2022-11-07 DIAGNOSIS — Z803 Family history of malignant neoplasm of breast: Secondary | ICD-10-CM

## 2022-11-07 DIAGNOSIS — Z90722 Acquired absence of ovaries, bilateral: Secondary | ICD-10-CM | POA: Diagnosis not present

## 2022-11-07 DIAGNOSIS — Z7901 Long term (current) use of anticoagulants: Secondary | ICD-10-CM | POA: Diagnosis not present

## 2022-11-07 DIAGNOSIS — Z170 Estrogen receptor positive status [ER+]: Secondary | ICD-10-CM

## 2022-11-07 DIAGNOSIS — Z800 Family history of malignant neoplasm of digestive organs: Secondary | ICD-10-CM

## 2022-11-07 DIAGNOSIS — Z8049 Family history of malignant neoplasm of other genital organs: Secondary | ICD-10-CM

## 2022-11-07 DIAGNOSIS — I2699 Other pulmonary embolism without acute cor pulmonale: Secondary | ICD-10-CM | POA: Diagnosis not present

## 2022-11-07 DIAGNOSIS — T451X5A Adverse effect of antineoplastic and immunosuppressive drugs, initial encounter: Secondary | ICD-10-CM

## 2022-11-07 LAB — CMP (CANCER CENTER ONLY)
ALT: 15 U/L (ref 0–44)
AST: 22 U/L (ref 15–41)
Albumin: 3.8 g/dL (ref 3.5–5.0)
Alkaline Phosphatase: 137 U/L — ABNORMAL HIGH (ref 38–126)
Anion gap: 7 (ref 5–15)
BUN: 20 mg/dL (ref 8–23)
CO2: 27 mmol/L (ref 22–32)
Calcium: 9.2 mg/dL (ref 8.9–10.3)
Chloride: 107 mmol/L (ref 98–111)
Creatinine: 0.79 mg/dL (ref 0.44–1.00)
GFR, Estimated: >60 mL/min (ref >60)
Glucose, Bld: 106 mg/dL — ABNORMAL HIGH (ref 70–99)
Potassium: 4.7 mmol/L (ref 3.5–5.1)
Sodium: 141 mmol/L (ref 135–145)
Total Bilirubin: 0.6 mg/dL (ref 0.3–1.2)
Total Protein: 6.6 g/dL (ref 6.5–8.1)

## 2022-11-07 LAB — CBC WITH DIFFERENTIAL (CANCER CENTER ONLY)
Abs Immature Granulocytes: 0.88 10*3/uL — ABNORMAL HIGH (ref 0.00–0.07)
Basophils Absolute: 0.1 10*3/uL (ref 0.0–0.1)
Basophils Relative: 1 %
Eosinophils Absolute: 0.3 10*3/uL (ref 0.0–0.5)
Eosinophils Relative: 2 %
HCT: 34 % — ABNORMAL LOW (ref 36.0–46.0)
Hemoglobin: 10.7 g/dL — ABNORMAL LOW (ref 12.0–15.0)
Immature Granulocytes: 6 %
Lymphocytes Relative: 26 %
Lymphs Abs: 3.6 10*3/uL (ref 0.7–4.0)
MCH: 32.5 pg (ref 26.0–34.0)
MCHC: 31.5 g/dL (ref 30.0–36.0)
MCV: 103.3 fL — ABNORMAL HIGH (ref 80.0–100.0)
Monocytes Absolute: 1 10*3/uL (ref 0.1–1.0)
Monocytes Relative: 7 %
Neutro Abs: 8 10*3/uL — ABNORMAL HIGH (ref 1.7–7.7)
Neutrophils Relative %: 58 %
Platelet Count: 243 10*3/uL (ref 150–400)
RBC: 3.29 MIL/uL — ABNORMAL LOW (ref 3.87–5.11)
RDW: 17.5 % — ABNORMAL HIGH (ref 11.5–15.5)
WBC Count: 13.9 10*3/uL — ABNORMAL HIGH (ref 4.0–10.5)
nRBC: 0.1 % (ref 0.0–0.2)

## 2022-11-07 MED ORDER — SODIUM CHLORIDE 0.9 % IV SOLN: Freq: Once | INTRAVENOUS | Status: AC

## 2022-11-07 MED ORDER — SODIUM CHLORIDE 0.9 % IV SOLN
400.0000 mg/m2 | Freq: Once | INTRAVENOUS | Status: AC
  Administered 2022-11-07: 896 mg via INTRAVENOUS
  Filled 2022-11-07: qty 44.8

## 2022-11-07 MED ORDER — ATROPINE SULFATE 1 MG/ML IV SOLN
0.5000 mg | Freq: Once | INTRAVENOUS | Status: AC | PRN
  Administered 2022-11-07: 0.5 mg via INTRAVENOUS
  Filled 2022-11-07: qty 1

## 2022-11-07 MED ORDER — SODIUM CHLORIDE 0.9 % IV SOLN
2400.0000 mg/m2 | INTRAVENOUS | Status: DC
  Administered 2022-11-07: 5000 mg via INTRAVENOUS
  Filled 2022-11-07: qty 100

## 2022-11-07 MED ORDER — SODIUM CHLORIDE 0.9% FLUSH
10.0000 mL | INTRAVENOUS | Status: DC | PRN
  Administered 2022-11-07: 10 mL

## 2022-11-07 MED ORDER — PALONOSETRON HCL INJECTION 0.25 MG/5ML
0.2500 mg | Freq: Once | INTRAVENOUS | Status: AC
  Administered 2022-11-07: 0.25 mg via INTRAVENOUS
  Filled 2022-11-07: qty 5

## 2022-11-07 MED ORDER — SODIUM CHLORIDE 0.9 % IV SOLN: INTRAVENOUS | Status: DC

## 2022-11-07 MED ORDER — SODIUM CHLORIDE 0.9 % IV SOLN
100.0000 mg/m2 | Freq: Once | INTRAVENOUS | Status: AC
  Administered 2022-11-07: 220 mg via INTRAVENOUS
  Filled 2022-11-07: qty 11

## 2022-11-07 MED ORDER — SODIUM CHLORIDE 0.9 % IV SOLN
10.0000 mg | Freq: Once | INTRAVENOUS | Status: AC
  Administered 2022-11-07: 10 mg via INTRAVENOUS
  Filled 2022-11-07: qty 10

## 2022-11-07 NOTE — Patient Instructions (Signed)
[facility]  Discharge Instructions: Thank you for choosing [facility] to provide your oncology and hematology care.   If you have a lab appointment with the Cancer Center, please go directly to the Cancer Center and check in at the registration area.   Wear comfortable clothing and clothing appropriate for easy access to any Portacath or PICC line.   We strive to give you quality time with your provider. You may need to reschedule your appointment if you arrive late (15 or more minutes).  Arriving late affects you and other patients whose appointments are after yours.  Also, if you miss three or more appointments without notifying the office, you may be dismissed from the clinic at the provider's discretion.      For prescription refill requests, have your pharmacy contact our office and allow 72 hours for refills to be completed.    Today you received the following chemotherapy and/or immunotherapy agents: irinotecan, leucovorin, fluorouracil      To help prevent nausea and vomiting after your treatment, we encourage you to take your nausea medication as directed.  BELOW ARE SYMPTOMS THAT SHOULD BE REPORTED IMMEDIATELY: *FEVER GREATER THAN 100.4 F (38 C) OR HIGHER *CHILLS OR SWEATING *NAUSEA AND VOMITING THAT IS NOT CONTROLLED WITH YOUR NAUSEA MEDICATION *UNUSUAL SHORTNESS OF BREATH *UNUSUAL BRUISING OR BLEEDING *URINARY PROBLEMS (pain or burning when urinating, or frequent urination) *BOWEL PROBLEMS (unusual diarrhea, constipation, pain near the anus) TENDERNESS IN MOUTH AND THROAT WITH OR WITHOUT PRESENCE OF ULCERS (sore throat, sores in mouth, or a toothache) UNUSUAL RASH, SWELLING OR PAIN  UNUSUAL VAGINAL DISCHARGE OR ITCHING   Items with * indicate a potential emergency and should be followed up as soon as possible or go to the Emergency Department if any problems should occur.  Please show the CHEMOTHERAPY ALERT CARD or  IMMUNOTHERAPY ALERT CARD at check-in to the Emergency Department and triage nurse.  Should you have questions after your visit or need to cancel or reschedule your appointment, please contact [facility]  Dept: [phone]  and follow the prompts.  Office hours are 8:00 a.m. to 4:30 p.m. Monday - Friday. Please note that voicemails left after 4:00 p.m. may not be returned until the following business day.  We are closed weekends and major holidays. You have access to a nurse at all times for urgent questions. Please call the main number to the clinic Dept: [phone] and follow the prompts.   For any non-urgent questions, you may also contact your provider using MyChart. We now offer e-Visits for anyone 18 and older to request care online for non-urgent symptoms. For details visit [email or web address].   Also download the MyChart app! Go to the app store, search "MyChart", open the app, select [facility], and log in with your MyChart username and password.   

## 2022-11-07 NOTE — Progress Notes (Signed)
[facility]   Telephone:([phone]) [phone] Fax:(336) [phone]   Clinic Follow up Note   Patient Care Team: [name], MD as PCP - General (Family Medicine) [name], MD as Consulting Physician (General Surgery) [name], MD as Consulting Physician (Hematology) [name], MD as Consulting Physician (General Surgery) [name], MD as Consulting Physician (Hematology and Oncology)  Date of Service:  11/07/2022  CHIEF COMPLAINT: f/u of metastatic colon cancer     CURRENT THERAPY:  FOLFIRI/Beva q14 days, starting 04/06/22. Beva stopped due to PE on restaging CT 10/2022     ASSESSMENT:  Rayshell Castro is a 77 y.o. female with   Malignant neoplasm of ascending colon (HCC) pT3N2aM0 stage IIB, MSS, metastatic to b/l ovaries and peritoneum and liver, KRAS mutation G12V (+)  -Initially diagnosed in 08/2019, s/p resection with Dr Maisie Fus on 11/01/19. Path showed overall Stage IIIB cancer.  -s/p 6 months adjuvant FOLFOX, completed 06/01/20, oxaliplatin held for final 2 cycles due to neuropathy.  -s/p BSO and hysterectomy on 12/22/21 with Dr. Pricilla Holm. Path revealed metastatic moderately differentiated colonic adenocarcinoma involving both ovaries and peritoneum -she restarted FOLFOX on 01/24/22. -Due to disease progression, chemotherapy changed to second line FOLFIRI and bevacizumab on April 06, 2022. She has been tolerating moderately well, better now with dose reduction  -Restaging CT scan from June 23, 2022 showed partial response in liver and peritoneal metastasis, no other new lesions.  I reviewed the images and discussed the results with patient.  She is pleased. -She has been tolerating chemotherapy well overall due to dose reduction.  We discussed maintenance therapy option in future  -due to her PE in 10/2022, will hold beva for 3-6 months   Malignant neoplasm of female breast [facility]) -Dx in 05/2014, s/p left lumpectomy with Dr Johna Sheriff, adjuvant RT with  Dr [name], and Anastrozole 08/2014 - 01/13/20 under Dr Pamelia Hoit.  -DEXA 09/11/20 T score +1.2 normal.  -most recent mammogram on 10/31/2022 was negative.     DNR (do not resuscitate) -The patient understands the goal of care is palliative.  -pt has agreed with DNR, order placed.     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  Overall stable.    Pulmonary embolism (HCC) -incidental findings of bilateral distal PEs on restaging CT 10/02/2022, pt was asymptomatic -doppler of LEs (-) DVT -continue Eliquis indefinitely        PLAN: -lab reviewed  -I refill Potassium 1 tablet daily -proceed with FOLFIRI today -lab/flush and f/u in 7/22 -No Beva for 3 months due to PE     SUMMARY OF ONCOLOGIC HISTORY: Oncology History Overview Note   Cancer Staging  Malignant neoplasm of female breast [facility]) Staging form: Breast, AJCC 7th Edition - Clinical stage from 06/11/2014: Stage 0 (Tis (DCIS), N0, M0) - Unsigned Staged by: Pathologist and managing physician Laterality: Left Estrogen receptor status: Positive Progesterone receptor status: Positive Stage used in treatment planning: Yes National guidelines used in treatment planning: Yes Type of national guideline used in treatment planning: NCCN - Pathologic stage from 07/03/2014: Stage Unknown (Tis (DCIS), NX, cM0) - Signed by Kim Leisure, MD on 07/10/2014 Staged by: Pathologist Laterality: Left Estrogen receptor status: Positive Progesterone receptor status: Positive Stage used in treatment planning: Yes National guidelines used in treatment planning: Yes Type of national guideline used in treatment planning: NCCN Staging comments: Staged on final lumpectomy specimen by Dr. [name].  Right colon cancer Staging  form: Colon and Rectum, AJCC 8th Edition - Pathologic stage from 11/01/2019: Stage IIIB (pT3, pN2a, cM0) - Signed by [name], MD on  11/29/2019 Stage prefix: Initial diagnosis Histologic grading system: 4 grade system Histologic grade (G): G2 Residual tumor (R): R0 - None Tumor deposits (TD): Absent Perineural invasion (PNI): Absent Microsatellite instability (MSI): Stable KRAS mutation: Unknown NRAS mutation: Unknown BRAF mutation: Unknown     Malignant neoplasm of female breast (HCC)  05/29/2014 Initial Biopsy   Left breast needle core biopsy: Grade 2, DCIS with calcs. ER+ (100%), PR+ (96%).    06/04/2014 Initial Diagnosis   Left breast DCIS with calcifications, ER 100%, PR 96%   06/10/2014 Breast MRI   Left breast: 2.4 x 1.3 x 1.1 cm area of patchy non-mass enhancement upper outer quadrant includes postbiopsy seroma; Right breast: 1.2 cm previously biopsied stable benign fibroadenoma   06/12/2014 Procedure   Genetic counseling/testing: Identified 1 VUS on CHEK2 gene. Remainder of 17 gene panel tested negative and included: ATM, BARD1, BRCA 1/2, BRIP1, CDH1, CHEK2, EPCAM, MLH1, MSH2, MSH6, NBN, NF1, PALB2, PTEN, RAD50, RAD51C, RAD51D, STK11, and TP53.    07/01/2014 Surgery   Left breast lumpectomy ([facility]): Grade 1, DCIS, spanning 2.3 cm, 1 mm margin, ER 100%, PR 96%   07/31/2014 - 08/28/2014 Radiation Therapy   Adjuvant RT completed [name]). Left breast: Total dose 42.5 Gy over 17 fractions. Left breast boost: Total dose 7.5 Gy over 3 fractions.    09/14/2014 - 01/13/2020 Anti-estrogen oral therapy   Anastrazole 1mg  daily. Planned duration of treatment: 5 years [place]). Completed in 01/2020.    09/25/2014 Survivorship   Survivorship Care Plan given to patient and reviewed with her in person.    03/02/2021 Imaging   CT CAP  IMPRESSION: 1. No findings of active/recurrent malignancy. Partial right hemicolectomy. 2. Endometrial stripe remains mildly thickened, but endometrial biopsy in May was negative for malignancy. 3. Progressive endplate sclerosis and endplate irregularity at T2-3, probably due to  degenerative endplate findings. If the has referable upper thoracic pain/symptoms then thoracic spine MRI could be used for further workup. 4. Other imaging findings of potential clinical significance: Mild cardiomegaly. Aortic Atherosclerosis (ICD10-I70.0). Mild mitral valve calcification. Postoperative findings in the left breast with adjacent radiation port anteriorly in the left upper lobe. Tiny pulmonary nodules in the left lower lobe are unchanged from earliest available comparison of 10/17/2019 and probably benign although may merit surveillance. Multilevel lumbar impingement. Mild pelvic floor laxity.   Malignant neoplasm of ascending colon (HCC)  09/19/2019 Imaging   CT AP W contrast 09/19/19  IMPRESSION Fullness in the cecum, cannot exclude a mass. No evidence for metastatic disease is identified.    09/24/2019 Procedure   Colonoscopy by Dr [name] 09/24/19 IMPRESSION 1. The colon was redundant  2. Mild diverticulosis was noted through the entire examined colon 3. Single 12mm polyp was found in the ascending colon; polypectomy was performed using snare cautery and biopsy forceps 4. Mild diverticulosis was notes in the descending colon and sigmoid colon.  5. Single polyp was found in the sigmoid colon, polypectomy was performed with cold forceps.  6. Single polyp was found in the rectosigmoid colon; polypectomy was performed with cold snare  7. Small internal hemorrhoids  8. Large mass was found at the cecum; multiple biopsies of the area were performed using cold forceps; injection (tattooing) was performed distal to the mass.    09/24/2019 Initial Biopsy   INTERPRETATION AND DIAGNOSIS:  A. Cecum, biopsy:  Invasive moderately  differentiated adenocarcinoma.  see comment  B. Polyp @ ascending colon, polypectomy:  Tubular Adenoma  C. Polyp @ sigmoid colon Polypectomy:  hyperplastic polyp.  D. Polyp @ rectosigmoid colon, Polypectomy:  Hyperplastic Polyp      10/16/2019 Imaging    CT Chest IMPRESSION: 1. Multiple small pulmonary nodules measuring 5 mm or less in size in the lungs. These are nonspecific and are typically considered statistically likely benign. However, given the patient's history of primary malignancy, close attention on follow-up studies is recommended to ensure stability. 2. Aortic atherosclerosis, in addition to right coronary artery disease. Assessment for potential risk factor modification, dietary therapy or pharmacologic therapy may be warranted, if clinically indicated. 3. There are calcifications of the aortic valve and mitral annulus. Echocardiographic correlation for evaluation of potential valvular dysfunction may be warranted if clinically indicated. 4. Small hiatal hernia.   Aortic Atherosclerosis (ICD10-I70.0).   11/01/2019 Initial Diagnosis   Colon cancer (HCC)   11/01/2019 Surgery   LAPAROSCOPIC PARTIAL COLECTOMY by Dr Maisie Fus and Dr Michaell Cowing   11/01/2019 Pathology Results   FINAL MICROSCOPIC DIAGNOSIS:   A. COLON, PROXIMAL RIGHT, COLECTOMY:  - Invasive colonic adenocarcinoma, 5 cm.  - Tumor invades through the muscularis propria into pericolonic tissues.   - Margins of resection are not involved.  - Metastatic carcinoma in (5) of (13) lymph nodes.  - See oncology table.    MSI Stable  Mismatch repair normal  MLH1 - Preserved nuclear expression (greater 50% tumor expression) MSH2 - Preserved nuclear expression (greater 50% tumor expression) MSH6 - Preserved nuclear expression (greater 50% tumor expression) PMS2 - Preserved nuclear expression (greater 50% tumor expression)   11/01/2019 Cancer Staging   Staging form: Colon and Rectum, AJCC 8th Edition - Pathologic stage from 11/01/2019: Stage IIIB (pT3, pN2a, cM0) - Signed by [name], MD on 11/29/2019   12/10/2019 Procedure   PAC placed 12/10/19   12/17/2019 - 06/01/2020 Chemotherapy   FOLFOX q2weeks starting in 2 weeks starting 12/17/19. Held 01/27/20-02/10/20 due to b/l PE.  Oxaliplatin held C11-12 due to neuropathy. Completed on 06/01/20   03/02/2021 Imaging   CT CAP  IMPRESSION: 1. No findings of active/recurrent malignancy. Partial right hemicolectomy. 2. Endometrial stripe remains mildly thickened, but endometrial biopsy in May was negative for malignancy. 3. Progressive endplate sclerosis and endplate irregularity at T2-3, probably due to degenerative endplate findings. If the has referable upper thoracic pain/symptoms then thoracic spine MRI could be used for further workup. 4. Other imaging findings of potential clinical significance: Mild cardiomegaly. Aortic Atherosclerosis (ICD10-I70.0). Mild mitral valve calcification. Postoperative findings in the left breast with adjacent radiation port anteriorly in the left upper lobe. Tiny pulmonary nodules in the left lower lobe are unchanged from earliest available comparison of 10/17/2019 and probably benign although may merit surveillance. Multilevel lumbar impingement. Mild pelvic floor laxity.   08/26/2021 Imaging   EXAM: CT CHEST, ABDOMEN, AND PELVIS WITH CONTRAST  IMPRESSION: 1. Stable examination without new or progressive findings to suggest local recurrence or metastatic disease within the chest, abdomen, or pelvis. 2. Hepatomegaly with hepatic steatosis. 3. Sigmoid colonic diverticulosis without findings of acute diverticulitis. 4. Similar prominent endplate sclerosis and irregularity at T2-T3 is most consistent with Modic type endplate degenerative changes. However, if patient has referable upper thoracic pain consider further workup with thoracic spine MRI. 5. Similar thickening of the endometrial stripe measuring up to 8 mm, which was previously biopsied with results negative for malignancy. 6. Aortic Atherosclerosis (ICD10-I70.0).   11/10/2021 PET scan  IMPRESSION: 1. LEFT ovary is increased in size and is intensely hypermetabolic. While physiologic hypermetabolic ovarian tissue is not  uncommon, the enlargement and asymmetric activity warrants further evaluation. Consider contrast pelvic MRI vs tissue sampling. 2. No evidence of metastatic colorectal carcinoma otherwise. 3. Post RIGHT hemicolectomy anatomy. 4. Evidence of radiation change in the LEFT upper lobe (remote breast cancer).     12/22/2021 Relapse/Recurrence    FINAL MICROSCOPIC DIAGNOSIS:   A. LEFT OVARY AND FALLOPIAN TUBE, SALPINGO OOPHORECTOMY:  - Metastatic moderately differentiated colonic adenocarcinoma involving left ovary  - Focal ovarian stromal calcification  - Segment of benign fallopian tube   B. UTERUS WITH RIGHT FALLOPIAN TUBE AND OVARY, HYSTERECTOMY AND SALPINGO-OOPHORECTOMY:  - Metastatic moderately differentiated colonic adenocarcinoma involving right ovary  - Focal invasive extensive adenomyosis  - Benign endometrial polyps  - Benign proliferative phase endometrium  - Hydrosalpinx of right fallopian tube  - Focal ovarian stromal calcification   C. PERITONEAL DEPOSITS, ANTERIOR CUL DE SAC, BIOPSY:  - Metastatic mucinous adenocarcinoma, consistent with colorectal primary    COMMENT:  Immunohistochemical stains show that the tumor cells are positive for CK20 and CDX2 while they are negative for CK7 and PAX8, consistent with above interpretation.    01/24/2022 - 03/23/2022 Chemotherapy   Patient is on Treatment Plan : COLORECTAL FOLFOX q14d x 3 months     04/01/2022 Imaging   CT AP IMPRESSION: 1. New omental metastatic disease. 2. Vague hypoattenuating lesion in the inferior right hepatic lobe is new and also worrisome for metastatic disease. 3. Similar small right lower lobe nodules. Recommend continued attention on follow-up. 4. Steatotic enlarged liver. 5.  Aortic atherosclerosis (ICD10-I70.0).   04/06/2022 -  Chemotherapy   Patient is on Treatment Plan : COLORECTAL FOLFIRI + Bevacizumab q14d     04/21/2022 Imaging    IMPRESSION: 1. Stable chest CT. No evidence of metastatic  disease. 2. Stable small pulmonary nodules, considered benign based on stability. 3. Stable postsurgical changes in the left breast and anterior left upper lobe radiation changes. 4. Aortic Atherosclerosis (ICD10-I70.0) and Emphysema (ICD10-J43.9).   04/21/2022 Imaging    IMPRESSION: 1. Small enhancing lesion inferiorly in the right hepatic lobe is typical of metastatic disease and stable from recent abdominal CT. No other definite liver lesions are identified. Mild hepatic steatosis. 2. No other significant abdominal findings. 3. Omental nodularity seen on CT is not well visualized by MRI.    Imaging     06/23/2022 Imaging    IMPRESSION: 1. Hypodense lesion of the inferior right lobe of the liver, hepatic segment VI is diminished in size, consistent with treatment response. 2. Tiny peritoneal and omental nodules identified by prior examination are diminished in size, consistent with treatment response. 3. Multiple tiny pulmonary nodules unchanged, most likely benign and incidental, however continued attention on follow-up warranted in the setting of known metastatic disease. 4. No evidence of new metastatic disease in the chest, abdomen, or pelvis. 5. Status post partial right hemicolectomy and reanastomosis. 6. Diffuse mosaic attenuation of the airspaces, consistent with small airways disease. 7. Hepatomegaly.      INTERVAL HISTORY:  Kim Castro is here for a follow up of metastatic colon cancer . She was last seen by NP Lacie on 10/24/2022. She presents to the clinic alone. Pt state that her energy level is about the same. Her neuropathy is a little worse than it was a couple of months ago. She states that she is able to do things with her hands.  All other systems were reviewed with the patient and are negative.  MEDICAL HISTORY:  Past Medical History:  Diagnosis Date   Aortic atherosclerosis (HCC)    Arthritis    feet, lower back   Basal cell carcinoma     arm   Breast cancer of upper-outer quadrant of left female breast (HCC) 06/04/2014   Cataract    immature on the left   Colon cancer (HCC) 08/2019   Diabetes mellitus without complication (HCC)    Diverticulosis    Dizziness    > 83yrs ago;took Antivert    Family history of anesthesia complication    sister slow to wake up with anesthesia   Family history of breast cancer    Family history of colon cancer    Family history of uterine cancer    GERD (gastroesophageal reflux disease)    takes occasional TUMs   History of bronchitis    > 38yrs ago   History of colon polyps    History of hiatal hernia    Small noted on CT   History of pulmonary embolus (PE)    Hypertension    takes Losartan daily and HCTZ   Iron deficiency anemia    Joint pain    Numbness    to toes on each foot   Peripheral neuropathy    feet and toes   Personal history of radiation therapy    Pulmonary nodules    Noted on CT   Radiation 07/31/14-08/28/14   Left Breast 20 fxs   Seasonal allergies    takes Claritin prn   Urinary frequency    Vitamin D deficiency    takes VIt D daily    SURGICAL HISTORY: Past Surgical History:  Procedure Laterality Date   BREAST BIOPSY Bilateral    BREAST LUMPECTOMY Left    BREAST LUMPECTOMY WITH RADIOACTIVE SEED LOCALIZATION Left 07/01/2014   Procedure: LEFT BREAST LUMPECTOMY WITH RADIOACTIVE SEED LOCALIZATION;  Surgeon: Glenna Fellows, MD;  Location: [facility];  Service: General;  Laterality: Left;   CATARACT EXTRACTION Right    COLONOSCOPY  09/24/2019   [place]   LAPAROSCOPIC PARTIAL COLECTOMY N/A 11/01/2019   Procedure: LAPAROSCOPIC PARTIAL COLECTOMY;  Surgeon: [name], MD;  Location: [facility];  Service: General;  Laterality: N/A;   POLYPECTOMY     PORTACATH PLACEMENT N/A 12/10/2019   Procedure: INSERTION PORT-A-CATH ULTRASOUND GUIDED IN RIGHT IJ;  Surgeon: [name], MD;  Location: [facility];  Service: General;  Laterality: N/A;    ROBOTIC ASSISTED TOTAL HYSTERECTOMY Bilateral 12/22/2021   Procedure: XI ROBOTIC ASSISTED TOTAL HYSTERECTOMY WITH BILATERAL SALPINGO OOPHORECTOMY;  Surgeon: Carver Fila, MD;  Location: [facility];  Service: Gynecology;  Laterality: Bilateral;   TOTAL KNEE ARTHROPLASTY Left 10/24/2012   Procedure: TOTAL KNEE ARTHROPLASTY;  Surgeon: Nestor Lewandowsky, MD;  Location: [facility];  Service: Orthopedics;  Laterality: Left;   TOTAL KNEE ARTHROPLASTY Right 01/09/2013   Procedure: TOTAL KNEE ARTHROPLASTY;  Surgeon: Nestor Lewandowsky, MD;  Location: [facility];  Service: Orthopedics;  Laterality: Right;   TUBAL LIGATION      I have reviewed the social history and family history with the patient and they are unchanged from previous note.  ALLERGIES:  is allergic to oxycodone.  MEDICATIONS:  Current Outpatient Medications  Medication Sig Dispense Refill   apixaban (ELIQUIS) 5 MG TABS tablet Take 1 tablet (5 mg total) by mouth 2 (two) times daily. 60 tablet 2   b complex vitamins capsule Take 1 capsule  by mouth daily.     Cholecalciferol (VITAMIN D) 2000 UNITS CAPS Take 2,000 Units by mouth daily.      ibuprofen (ADVIL) 200 MG tablet Take 200 mg by mouth daily as needed (pain).     loratadine (CLARITIN) 10 MG tablet Take 10 mg by mouth daily as needed for allergies.     losartan (COZAAR) 25 MG tablet Take 1 tablet (25 mg total) by mouth daily. 90 tablet 3   metFORMIN (GLUCOPHAGE) 500 MG tablet Take 1 tablet (500 mg total) by mouth in the morning and at bedtime. 180 tablet 3   Multiple Vitamins-Minerals (MULTIVITAMIN WITH MINERALS) tablet Take 1 tablet by mouth daily.     omeprazole (PRILOSEC) 20 MG capsule Take 20 mg by mouth daily before breakfast.      ondansetron (ZOFRAN) 8 MG tablet Take 1 tablet (8 mg total) by mouth every 8 (eight) hours as needed for nausea or vomiting. Start on the third day after chemotherapy. 30 tablet 1   potassium chloride (KLOR-CON) 10 MEQ tablet Take 1 tablet by mouth once daily 30  tablet 0   pravastatin (PRAVACHOL) 10 MG tablet Take 1 tablet (10 mg total) by mouth daily. 90 tablet 3   pregabalin (LYRICA) 25 MG capsule Take 1 capsule (25 mg total) by mouth every morning. Take in addition to 50 mg at bedtime 60 capsule 2   pregabalin (LYRICA) 50 MG capsule Take 1 capsule (50 mg total) by mouth at bedtime. Take in addition to 25 mg in the morning 60 capsule 2   prochlorperazine (COMPAZINE) 10 MG tablet Take 1 tablet (10 mg total) by mouth every 6 (six) hours as needed for nausea or vomiting. 30 tablet 1   triamcinolone ointment (KENALOG) 0.5 % Apply 1 Application topically 2 (two) times daily. 30 g 0   No current facility-administered medications for this visit.   Facility-Administered Medications Ordered in Other Visits  Medication Dose Route Frequency Provider Last Rate Last Admin   0.9 %  sodium chloride infusion   Intravenous Continuous [name], MD 20 mL/hr at 11/07/22 0926 New Bag at 11/07/22 0926   fluorouracil (ADRUCIL) 5,000 mg in sodium chloride 0.9 % 150 mL chemo infusion  2,400 mg/m2 (Treatment Plan Recorded) Intravenous 1 day or 1 dose [name], MD       irinotecan (CAMPTOSAR) 220 mg in sodium chloride 0.9 % 500 mL chemo infusion  100 mg/m2 (Treatment Plan Recorded) Intravenous Once [name], MD 341 mL/hr at 11/07/22 1027 220 mg at 11/07/22 1027   leucovorin 896 mg in sodium chloride 0.9 % 250 mL infusion  400 mg/m2 (Treatment Plan Recorded) Intravenous Once [name], MD 197 mL/hr at 11/07/22 1024 896 mg at 11/07/22 1024    PHYSICAL EXAMINATION: ECOG PERFORMANCE STATUS: 1 - Symptomatic but completely ambulatory  Vitals:   11/07/22 0844  BP: (!) 119/46  Pulse: 63  Resp: 16  Temp: 98 F (36.7 C)  SpO2: 98%   Wt Readings from Last 3 Encounters:  11/07/22 213 lb 6.4 oz (96.8 kg)  11/04/22 209 lb 3.2 oz (94.9 kg)  10/24/22 212 lb 14.4 oz (96.6 kg)     GENERAL:alert, no distress and comfortable SKIN: skin color normal, no rashes or significant  lesions EYES: normal, Conjunctiva are pink and non-injected, sclera clear  NEURO: alert & oriented x 3 with fluent speech ABORATORY DATA:  I have reviewed the data as listed    Latest Ref Rng & Units 11/07/2022    8:13  AM 10/24/2022    7:55 AM 10/10/2022    8:52 AM  CBC  WBC 4.0 - 10.5 K/uL 13.9  13.2  15.0   Hemoglobin 12.0 - 15.0 g/dL 16.1  09.6  04.5   Hematocrit 36.0 - 46.0 % 34.0  35.0  34.0   Platelets 150 - 400 K/uL 243  197  266         Latest Ref Rng & Units 11/07/2022    8:13 AM 10/24/2022    7:55 AM 10/10/2022    8:52 AM  CMP  Glucose 70 - 99 mg/dL 409  811  914   BUN 8 - 23 mg/dL 20  16  25    Creatinine 0.44 - 1.00 mg/dL 7.82  9.56  2.13   Sodium 135 - 145 mmol/L 141  144  139   Potassium 3.5 - 5.1 mmol/L 4.7  3.9  4.5   Chloride 98 - 111 mmol/L 107  111  110   CO2 22 - 32 mmol/L 27  25  23    Calcium 8.9 - 10.3 mg/dL 9.2  9.0  9.5   Total Protein 6.5 - 8.1 g/dL 6.6  6.2  6.8   Total Bilirubin 0.3 - 1.2 mg/dL 0.6  0.7  0.8   Alkaline Phos 38 - 126 U/L 137  142  146   AST 15 - 41 U/L 22  18  25    ALT 0 - 44 U/L 15  14  21        RADIOGRAPHIC STUDIES: I have personally reviewed the radiological images as listed and agreed with the findings in the report. No results found.    Orders Placed This Encounter  Procedures   CBC with Differential (Cancer Center Only)    Standing Status:   Future    Standing Expiration Date:   12/19/2023   CMP (Cancer Center only)    Standing Status:   Future    Standing Expiration Date:   12/19/2023   Total Protein, Urine dipstick    Standing Status:   Future    Standing Expiration Date:   12/19/2023   CBC with Differential (Cancer Center Only)    Standing Status:   Future    Standing Expiration Date:   01/02/2024   CMP (Cancer Center only)    Standing Status:   Future    Standing Expiration Date:   01/02/2024   Total Protein, Urine dipstick    Standing Status:   Future    Standing Expiration Date:   01/02/2024   CBC with Differential  (Cancer Center Only)    Standing Status:   Future    Standing Expiration Date:   01/16/2024   CMP (Cancer Center only)    Standing Status:   Future    Standing Expiration Date:   01/16/2024   Total Protein, Urine dipstick    Standing Status:   Future    Standing Expiration Date:   01/16/2024   CBC with Differential (Cancer Center Only)    Standing Status:   Future    Standing Expiration Date:   01/30/2024   CMP (Cancer Center only)    Standing Status:   Future    Standing Expiration Date:   01/30/2024   Total Protein, Urine dipstick    Standing Status:   Future    Standing Expiration Date:   01/30/2024   All questions were answered. The patient knows to call the clinic with any problems, questions or concerns. No barriers to learning was detected.  The total time spent in the appointment was 25 minutes.     [name], MD 11/07/2022   Carolin Coy, CMA, am acting as scribe for [name], MD.   I have reviewed the above documentation for accuracy and completeness, and I agree with the above.

## 2022-11-08 ENCOUNTER — Other Ambulatory Visit: Payer: Self-pay

## 2022-11-08 ENCOUNTER — Other Ambulatory Visit: Payer: Self-pay | Admitting: Hematology

## 2022-11-09 ENCOUNTER — Inpatient Hospital Stay: Payer: Medicare Other

## 2022-11-09 ENCOUNTER — Other Ambulatory Visit: Payer: Self-pay

## 2022-11-09 VITALS — BP 127/51 | HR 62 | Temp 99.1°F | Resp 16

## 2022-11-09 DIAGNOSIS — C182 Malignant neoplasm of ascending colon: Secondary | ICD-10-CM

## 2022-11-09 DIAGNOSIS — Z90722 Acquired absence of ovaries, bilateral: Secondary | ICD-10-CM | POA: Diagnosis not present

## 2022-11-09 DIAGNOSIS — Z7901 Long term (current) use of anticoagulants: Secondary | ICD-10-CM | POA: Diagnosis not present

## 2022-11-09 DIAGNOSIS — C787 Secondary malignant neoplasm of liver and intrahepatic bile duct: Secondary | ICD-10-CM | POA: Diagnosis not present

## 2022-11-09 DIAGNOSIS — G620 Drug-induced polyneuropathy: Secondary | ICD-10-CM | POA: Diagnosis not present

## 2022-11-09 DIAGNOSIS — I2699 Other pulmonary embolism without acute cor pulmonale: Secondary | ICD-10-CM | POA: Diagnosis not present

## 2022-11-09 DIAGNOSIS — Z5111 Encounter for antineoplastic chemotherapy: Secondary | ICD-10-CM | POA: Diagnosis not present

## 2022-11-09 DIAGNOSIS — Z853 Personal history of malignant neoplasm of breast: Secondary | ICD-10-CM | POA: Diagnosis not present

## 2022-11-09 DIAGNOSIS — C7963 Secondary malignant neoplasm of bilateral ovaries: Secondary | ICD-10-CM | POA: Diagnosis not present

## 2022-11-09 DIAGNOSIS — C786 Secondary malignant neoplasm of retroperitoneum and peritoneum: Secondary | ICD-10-CM | POA: Diagnosis not present

## 2022-11-09 DIAGNOSIS — Z5189 Encounter for other specified aftercare: Secondary | ICD-10-CM | POA: Diagnosis not present

## 2022-11-09 DIAGNOSIS — Z66 Do not resuscitate: Secondary | ICD-10-CM | POA: Diagnosis not present

## 2022-11-09 DIAGNOSIS — Z90710 Acquired absence of both cervix and uterus: Secondary | ICD-10-CM | POA: Diagnosis not present

## 2022-11-09 MED ORDER — SODIUM CHLORIDE 0.9% FLUSH
10.0000 mL | INTRAVENOUS | Status: DC | PRN
  Administered 2022-11-09: 10 mL

## 2022-11-09 MED ORDER — PEGFILGRASTIM-CBQV 6 MG/0.6ML ~~LOC~~ SOSY
6.0000 mg | PREFILLED_SYRINGE | Freq: Once | SUBCUTANEOUS | Status: AC
  Administered 2022-11-09: 6 mg via SUBCUTANEOUS
  Filled 2022-11-09: qty 0.6

## 2022-11-09 MED ORDER — HEPARIN SOD (PORK) LOCK FLUSH 100 UNIT/ML IV SOLN
500.0000 [IU] | Freq: Once | INTRAVENOUS | Status: AC | PRN
  Administered 2022-11-09: 500 [IU]

## 2022-11-15 ENCOUNTER — Ambulatory Visit: Payer: Medicare Other | Admitting: Family Medicine

## 2022-11-15 ENCOUNTER — Encounter: Payer: Self-pay | Admitting: Family Medicine

## 2022-11-15 VITALS — BP 120/64 | HR 65 | Temp 98.3°F | Ht 65.5 in | Wt 208.4 lb

## 2022-11-15 DIAGNOSIS — E785 Hyperlipidemia, unspecified: Secondary | ICD-10-CM

## 2022-11-15 DIAGNOSIS — Z86711 Personal history of pulmonary embolism: Secondary | ICD-10-CM | POA: Diagnosis not present

## 2022-11-15 DIAGNOSIS — E7849 Other hyperlipidemia: Secondary | ICD-10-CM | POA: Diagnosis not present

## 2022-11-15 DIAGNOSIS — E1169 Type 2 diabetes mellitus with other specified complication: Secondary | ICD-10-CM

## 2022-11-15 DIAGNOSIS — G620 Drug-induced polyneuropathy: Secondary | ICD-10-CM

## 2022-11-15 DIAGNOSIS — I10 Essential (primary) hypertension: Secondary | ICD-10-CM

## 2022-11-15 DIAGNOSIS — C182 Malignant neoplasm of ascending colon: Secondary | ICD-10-CM | POA: Diagnosis not present

## 2022-11-15 DIAGNOSIS — T451X5A Adverse effect of antineoplastic and immunosuppressive drugs, initial encounter: Secondary | ICD-10-CM

## 2022-11-15 DIAGNOSIS — Z7984 Long term (current) use of oral hypoglycemic drugs: Secondary | ICD-10-CM

## 2022-11-15 LAB — GLUCOSE, RANDOM: Glucose, Bld: 112 mg/dL — ABNORMAL HIGH (ref 70–99)

## 2022-11-15 LAB — HEMOGLOBIN A1C: Hgb A1c MFr Bld: 6.5 % (ref 4.6–6.5)

## 2022-11-15 MED ORDER — METFORMIN HCL 500 MG PO TABS: 500.0000 mg | ORAL_TABLET | Freq: Two times a day (BID) | ORAL | 3 refills | Status: DC

## 2022-11-15 MED ORDER — PRAVASTATIN SODIUM 10 MG PO TABS
10.0000 mg | ORAL_TABLET | Freq: Every day | ORAL | 3 refills | Status: DC
Start: 2022-11-15 — End: 2023-12-25

## 2022-11-15 MED ORDER — LOSARTAN POTASSIUM 25 MG PO TABS: 25.0000 mg | ORAL_TABLET | Freq: Every day | ORAL | 3 refills | Status: DC

## 2022-11-15 NOTE — Assessment & Plan Note (Signed)
Stable.  Continue pravastatin 10mg daily

## 2022-11-15 NOTE — Assessment & Plan Note (Signed)
Continue apixaban 5 mg bid. This has been asymptomatic.

## 2022-11-15 NOTE — Progress Notes (Signed)
[facility] [address] [place] [address] Dept: [phone] Dept Fax: [phone]  Chronic Care Office Visit  Subjective:    Patient ID: Kim Castro, female    DOB: [birth year], 77 y.o..   MRN: [id number]  Chief Complaint  Patient presents with   Medical Management of Chronic Issues    3 month f/u.     History of Present Illness:  Patient is in today for reassessment of chronic medical issues.  Ms. Stainback has a history of type 2 diabetes. She is managed on metformin 500 mg bid.    Ms. Babler has a history of hypertension. She is managed on losartan 25 mg daily.   Ms. Humphreys has a history of hyperlipidemia. She is currently on pravastatin 10 mg daily.   Ms. Carlucci has a history of both breast and colon cancer. Earlier in 2023, Ms. Keach was found to have metastatic colon cancer throughout the peritoneal cavity, esp. involving both ovaries. She underwent a total hysterectomy. She currently has a Port-a-cath in place and is receiving chemotherapy. She is receiving this every other week. In early June, she was noted to have pulmonary emboli. These were felt to be a side effect of her bevacizumab, which has been stopped. She is now on apixaban, which will continue until her chemotherapies are stopped.   Past Medical History: Patient Active Problem List   Diagnosis Date Noted   Pulmonary embolism (HCC) 10/02/2022   Macrocytic anemia 10/02/2022   Leukocytosis 10/02/2022   Metastasis to peritoneal cavity (HCC) 01/12/2022   Nausea without vomiting 12/23/2021   Thickened endometrium    Essential hypertension 05/12/2021   Vitamin D deficiency 05/12/2021   Personal history of malignant neoplasm of breast 05/12/2021   Personal history of colonic polyps 05/12/2021   Morbid obesity (HCC) 05/12/2021   Hyperlipidemia 05/12/2021   Hypercalcemia 05/12/2021   Allergic rhinitis 05/12/2021   Knee joint replacement status,  bilateral 05/12/2021   Peripheral neuropathy due to chemotherapy (HCC) 05/12/2021   Aortic atherosclerosis (HCC) 05/12/2021   Gastroesophageal reflux disease    Type 2 diabetes mellitus with hyperlipidemia (HCC)    History of pulmonary embolism 01/27/2020   Port-A-Cath in place 12/30/2019   Malignant neoplasm of ascending colon (HCC) 11/01/2019   Genetic testing 07/08/2014   Malignant neoplasm of female breast (HCC) 06/04/2014   Past Surgical History:  Procedure Laterality Date   BREAST BIOPSY Bilateral    BREAST LUMPECTOMY Left    BREAST LUMPECTOMY WITH RADIOACTIVE SEED LOCALIZATION Left 07/01/2014   Procedure: LEFT BREAST LUMPECTOMY WITH RADIOACTIVE SEED LOCALIZATION;  Surgeon: Glenna Fellows, MD;  Location: [facility];  Service: General;  Laterality: Left;   CATARACT EXTRACTION Right    COLONOSCOPY  09/24/2019   [place]   LAPAROSCOPIC PARTIAL COLECTOMY N/A 11/01/2019   Procedure: LAPAROSCOPIC PARTIAL COLECTOMY;  Surgeon: [name], MD;  Location: [facility];  Service: General;  Laterality: N/A;   POLYPECTOMY     PORTACATH PLACEMENT N/A 12/10/2019   Procedure: INSERTION PORT-A-CATH ULTRASOUND GUIDED IN RIGHT IJ;  Surgeon: [name], MD;  Location: [facility];  Service: General;  Laterality: N/A;   ROBOTIC ASSISTED TOTAL HYSTERECTOMY Bilateral 12/22/2021   Procedure: XI ROBOTIC ASSISTED TOTAL HYSTERECTOMY WITH BILATERAL SALPINGO OOPHORECTOMY;  Surgeon: Carver Fila, MD;  Location: [facility];  Service: Gynecology;  Laterality: Bilateral;   TOTAL KNEE ARTHROPLASTY Left 10/24/2012   Procedure: TOTAL KNEE ARTHROPLASTY;  Surgeon: Nestor Lewandowsky, MD;  Location: [facility];  Service: Orthopedics;  Laterality: Left;   TOTAL KNEE ARTHROPLASTY Right 01/09/2013   Procedure: TOTAL KNEE ARTHROPLASTY;  Surgeon: Nestor Lewandowsky, MD;  Location: [facility];  Service: Orthopedics;  Laterality: Right;   TUBAL LIGATION     Family History  Problem Relation Age of Onset   Diabetes Mother     Uterine cancer Mother        deceased 65   Hypertension Father    Heart disease Father    Diabetes Sister    Breast cancer Sister 32       currently 100   Hypertension Sister    Diabetes Sister    Breast cancer Sister    Diabetes Sister    Kidney disease Sister    Heart disease Sister    Lupus Sister    Heart disease Sister    Diabetes Sister    Breast cancer Sister    Diabetes Sister    Breast cancer Daughter    Colon polyps Daughter    Thyroid cancer Daughter 28       currently 47; type?   Cancer Maternal Uncle        unk. primary; deceased 37s   Stroke Maternal Grandmother    Hypertension Brother    Diabetes Brother    Colon cancer Brother 26   Esophageal cancer Neg Hx    Rectal cancer Neg Hx    Stomach cancer Neg Hx    Outpatient Medications Prior to Visit  Medication Sig Dispense Refill   apixaban (ELIQUIS) 5 MG TABS tablet Take 1 tablet (5 mg total) by mouth 2 (two) times daily. 60 tablet 2   b complex vitamins capsule Take 1 capsule by mouth daily.     Cholecalciferol (VITAMIN D) 2000 UNITS CAPS Take 2,000 Units by mouth daily.      ibuprofen (ADVIL) 200 MG tablet Take 200 mg by mouth daily as needed (pain).     loratadine (CLARITIN) 10 MG tablet Take 10 mg by mouth daily as needed for allergies.     losartan (COZAAR) 25 MG tablet Take 1 tablet (25 mg total) by mouth daily. 90 tablet 3   metFORMIN (GLUCOPHAGE) 500 MG tablet Take 1 tablet (500 mg total) by mouth in the morning and at bedtime. 180 tablet 3   Multiple Vitamins-Minerals (MULTIVITAMIN WITH MINERALS) tablet Take 1 tablet by mouth daily.     omeprazole (PRILOSEC) 20 MG capsule Take 20 mg by mouth daily before breakfast.      ondansetron (ZOFRAN) 8 MG tablet Take 1 tablet (8 mg total) by mouth every 8 (eight) hours as needed for nausea or vomiting. Start on the third day after chemotherapy. 30 tablet 1   potassium chloride (KLOR-CON) 10 MEQ tablet Take 1 tablet by mouth once daily 30 tablet 0   pravastatin  (PRAVACHOL) 10 MG tablet Take 1 tablet (10 mg total) by mouth daily. 90 tablet 3   pregabalin (LYRICA) 25 MG capsule Take 1 capsule (25 mg total) by mouth every morning. Take in addition to 50 mg at bedtime 60 capsule 2   pregabalin (LYRICA) 50 MG capsule Take 1 capsule (50 mg total) by mouth at bedtime. Take in addition to 25 mg in the morning 60 capsule 2   prochlorperazine (COMPAZINE) 10 MG tablet Take 1 tablet (10 mg total) by mouth every 6 (six) hours as needed for nausea or vomiting. 30 tablet 1   triamcinolone ointment (KENALOG) 0.5 % Apply 1 Application topically 2 (two) times daily. 30  g 0   No facility-administered medications prior to visit.   Allergies  Allergen Reactions   Oxycodone Nausea And Vomiting   Objective:   Today's Vitals   11/15/22 1035  BP: 120/64  Pulse: 65  Temp: 98.3 F (36.8 C)  TempSrc: Temporal  SpO2: 97%  Weight: 208 lb 6.4 oz (94.5 kg)  Height: 5' 5.5" (1.664 m)   Body mass index is 34.15 kg/m.   General: Well developed, well nourished. No acute distress. Feet- Skin intact. No sign of maceration between toes. The left great nail has some mild yellow discoloration. Dorsalis   pedis and posterior tibial artery pulses are normal. Sensation is absent over most of both feet. Psych: Alert and oriented. Normal mood and affect.  Health Maintenance Due  Topic Date Due   OPHTHALMOLOGY EXAM  09/07/2022   Lab Results    Latest Ref Rng & Units 11/07/2022    8:13 AM 10/24/2022    7:55 AM 10/10/2022    8:52 AM  CBC  WBC 4.0 - 10.5 K/uL 13.9  13.2  15.0   Hemoglobin 12.0 - 15.0 g/dL 16.1  09.6  04.5   Hematocrit 36.0 - 46.0 % 34.0  35.0  34.0   Platelets 150 - 400 K/uL 243  197  266       Latest Ref Rng & Units 11/07/2022    8:13 AM 10/24/2022    7:55 AM 10/10/2022    8:52 AM  CMP  Glucose 70 - 99 mg/dL 409  811  914   BUN 8 - 23 mg/dL 20  16  25    Creatinine 0.44 - 1.00 mg/dL 7.82  9.56  2.13   Sodium 135 - 145 mmol/L 141  144  139   Potassium 3.5  - 5.1 mmol/L 4.7  3.9  4.5   Chloride 98 - 111 mmol/L 107  111  110   CO2 22 - 32 mmol/L 27  25  23    Calcium 8.9 - 10.3 mg/dL 9.2  9.0  9.5   Total Protein 6.5 - 8.1 g/dL 6.6  6.2  6.8   Total Bilirubin 0.3 - 1.2 mg/dL 0.6  0.7  0.8   Alkaline Phos 38 - 126 U/L 137  142  146   AST 15 - 41 U/L 22  18  25    ALT 0 - 44 U/L 15  14  21        Assessment & Plan:   Problem List Items Addressed This Visit       Cardiovascular and Mediastinum   Essential hypertension - Primary    Blood pressure is in good control. Continue losartan 25 mg daily.      Relevant Medications   losartan (COZAAR) 25 MG tablet   pravastatin (PRAVACHOL) 10 MG tablet     Digestive   Malignant neoplasm of ascending colon (HCC)    Continue chemotherapy.        Endocrine   Type 2 diabetes mellitus with hyperlipidemia (HCC)    Will check A1c today. Continue metformin 500 mg bid.      Relevant Medications   losartan (COZAAR) 25 MG tablet   metFORMIN (GLUCOPHAGE) 500 MG tablet   pravastatin (PRAVACHOL) 10 MG tablet   Other Relevant Orders   Glucose, random   Hemoglobin A1c     Nervous and Auditory   Peripheral neuropathy due to chemotherapy (HCC)    -Secondary to oxaliplatin -She is on Lyrica and B complex, continue to monitor.  Overall stable.  Other   History of pulmonary embolism    Continue apixaban 5 mg bid. This has been asymptomatic.      Hyperlipidemia    Stable. Continue pravastatin 10 mg daily.      Relevant Medications   losartan (COZAAR) 25 MG tablet   pravastatin (PRAVACHOL) 10 MG tablet    Return in about 3 months (around 02/15/2023) for Reassessment.   [name], MD

## 2022-11-15 NOTE — Assessment & Plan Note (Signed)
Blood pressure is in good control. Continue losartan 25 mg daily.

## 2022-11-15 NOTE — Assessment & Plan Note (Signed)
 Continue chemotherapy

## 2022-11-15 NOTE — Assessment & Plan Note (Signed)
Will check A1c today. Continue metformin 500 mg bid.

## 2022-11-15 NOTE — Assessment & Plan Note (Signed)
-  Secondary to oxaliplatin -She is on Lyrica and B complex, continue to monitor.  Overall stable.   

## 2022-11-18 ENCOUNTER — Other Ambulatory Visit: Payer: Self-pay | Admitting: *Deleted

## 2022-11-18 DIAGNOSIS — C786 Secondary malignant neoplasm of retroperitoneum and peritoneum: Secondary | ICD-10-CM

## 2022-11-18 MED ORDER — SODIUM CHLORIDE 0.9 % IV SOLN: INTRAVENOUS | Status: DC

## 2022-11-18 MED FILL — Dexamethasone Sodium Phosphate Inj 100 MG/10ML: INTRAMUSCULAR | Qty: 1 | Status: AC

## 2022-11-20 NOTE — Assessment & Plan Note (Signed)
-  Secondary to oxaliplatin -She is on Lyrica and B complex, continue to monitor.  Overall stable.   

## 2022-11-20 NOTE — Assessment & Plan Note (Signed)
-  Dx in 05/2014, s/p left lumpectomy with Dr Johna Sheriff, adjuvant RT with Dr [name], and Anastrozole 08/2014 - 01/13/20 under Dr Pamelia Hoit.  -DEXA 09/11/20 T score +1.2 normal.  -most recent mammogram on 10/31/2022 was negative.

## 2022-11-20 NOTE — Assessment & Plan Note (Signed)
-  incidental findings of bilateral distal PEs on restaging CT 10/02/2022, pt was asymptomatic -doppler of LEs (-) DVT -continue Eliquis indefinitely  

## 2022-11-20 NOTE — Assessment & Plan Note (Signed)
-  The patient understands the goal of care is palliative.  -pt has agreed with DNR, order placed.  

## 2022-11-20 NOTE — Assessment & Plan Note (Addendum)
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 Path showed overall Stage IIIB cancer.  -s/p 6 months adjuvant FOLFOX, completed 06/01/20, oxaliplatin held for final 2 cycles due to neuropathy.  -s/p BSO and hysterectomy on 12/22/21 with Dr. Pricilla Holm. Path revealed metastatic moderately differentiated colonic adenocarcinoma involving both ovaries and peritoneum -she restarted FOLFOX on 01/24/22. -Due to disease progression, chemotherapy changed to second line FOLFIRI and bevacizumab on April 06, 2022. She has been tolerating moderately well, better now with dose reduction  -Restaging CT scan from 09/27/2022 showed continous response in liver and peritoneal metastasis, no other new lesions.   -She has been tolerating chemotherapy well overall due to dose reduction.  We discussed maintenance therapy option in future  -due to her PE in 10/2022, will hold beva for 3-6 months

## 2022-11-21 ENCOUNTER — Other Ambulatory Visit: Payer: Self-pay

## 2022-11-21 ENCOUNTER — Inpatient Hospital Stay: Payer: Medicare Other | Admitting: Hematology

## 2022-11-21 ENCOUNTER — Inpatient Hospital Stay: Payer: Medicare Other

## 2022-11-21 ENCOUNTER — Encounter: Payer: Self-pay | Admitting: Hematology

## 2022-11-21 VITALS — BP 130/53 | HR 60 | Temp 98.2°F | Resp 17 | Ht 65.5 in | Wt 215.9 lb

## 2022-11-21 DIAGNOSIS — T451X5A Adverse effect of antineoplastic and immunosuppressive drugs, initial encounter: Secondary | ICD-10-CM

## 2022-11-21 DIAGNOSIS — Z5111 Encounter for antineoplastic chemotherapy: Secondary | ICD-10-CM | POA: Diagnosis not present

## 2022-11-21 DIAGNOSIS — Z95828 Presence of other vascular implants and grafts: Secondary | ICD-10-CM

## 2022-11-21 DIAGNOSIS — I2699 Other pulmonary embolism without acute cor pulmonale: Secondary | ICD-10-CM

## 2022-11-21 DIAGNOSIS — C182 Malignant neoplasm of ascending colon: Secondary | ICD-10-CM | POA: Diagnosis not present

## 2022-11-21 DIAGNOSIS — C7963 Secondary malignant neoplasm of bilateral ovaries: Secondary | ICD-10-CM

## 2022-11-21 DIAGNOSIS — C786 Secondary malignant neoplasm of retroperitoneum and peritoneum: Secondary | ICD-10-CM

## 2022-11-21 DIAGNOSIS — C50919 Malignant neoplasm of unspecified site of unspecified female breast: Secondary | ICD-10-CM

## 2022-11-21 DIAGNOSIS — Z853 Personal history of malignant neoplasm of breast: Secondary | ICD-10-CM | POA: Diagnosis not present

## 2022-11-21 DIAGNOSIS — Z66 Do not resuscitate: Secondary | ICD-10-CM

## 2022-11-21 DIAGNOSIS — Z5189 Encounter for other specified aftercare: Secondary | ICD-10-CM | POA: Diagnosis not present

## 2022-11-21 DIAGNOSIS — G620 Drug-induced polyneuropathy: Secondary | ICD-10-CM | POA: Diagnosis not present

## 2022-11-21 DIAGNOSIS — C787 Secondary malignant neoplasm of liver and intrahepatic bile duct: Secondary | ICD-10-CM

## 2022-11-21 DIAGNOSIS — I2694 Multiple subsegmental pulmonary emboli without acute cor pulmonale: Secondary | ICD-10-CM

## 2022-11-21 DIAGNOSIS — Z90710 Acquired absence of both cervix and uterus: Secondary | ICD-10-CM | POA: Diagnosis not present

## 2022-11-21 DIAGNOSIS — Z90722 Acquired absence of ovaries, bilateral: Secondary | ICD-10-CM | POA: Diagnosis not present

## 2022-11-21 DIAGNOSIS — Z7901 Long term (current) use of anticoagulants: Secondary | ICD-10-CM | POA: Diagnosis not present

## 2022-11-21 LAB — CMP (CANCER CENTER ONLY)
ALT: 17 U/L (ref 0–44)
AST: 25 U/L (ref 15–41)
Albumin: 3.8 g/dL (ref 3.5–5.0)
Alkaline Phosphatase: 131 U/L — ABNORMAL HIGH (ref 38–126)
Anion gap: 7 (ref 5–15)
BUN: 13 mg/dL (ref 8–23)
CO2: 26 mmol/L (ref 22–32)
Calcium: 9.2 mg/dL (ref 8.9–10.3)
Chloride: 109 mmol/L (ref 98–111)
Creatinine: 0.83 mg/dL (ref 0.44–1.00)
GFR, Estimated: >60 mL/min (ref >60)
Glucose, Bld: 101 mg/dL — ABNORMAL HIGH (ref 70–99)
Potassium: 4.5 mmol/L (ref 3.5–5.1)
Sodium: 142 mmol/L (ref 135–145)
Total Bilirubin: 0.7 mg/dL (ref 0.3–1.2)
Total Protein: 5.9 g/dL — ABNORMAL LOW (ref 6.5–8.1)

## 2022-11-21 LAB — CBC WITH DIFFERENTIAL (CANCER CENTER ONLY)
Abs Immature Granulocytes: 0.98 10*3/uL — ABNORMAL HIGH (ref 0.00–0.07)
Basophils Absolute: 0.1 10*3/uL (ref 0.0–0.1)
Basophils Relative: 0 %
Eosinophils Absolute: 0.4 10*3/uL (ref 0.0–0.5)
Eosinophils Relative: 2 %
HCT: 32.3 % — ABNORMAL LOW (ref 36.0–46.0)
Hemoglobin: 10.3 g/dL — ABNORMAL LOW (ref 12.0–15.0)
Immature Granulocytes: 7 %
Lymphocytes Relative: 23 %
Lymphs Abs: 3.4 10*3/uL (ref 0.7–4.0)
MCH: 32.6 pg (ref 26.0–34.0)
MCHC: 31.9 g/dL (ref 30.0–36.0)
MCV: 102.2 fL — ABNORMAL HIGH (ref 80.0–100.0)
Monocytes Absolute: 1.2 10*3/uL — ABNORMAL HIGH (ref 0.1–1.0)
Monocytes Relative: 8 %
Neutro Abs: 8.8 10*3/uL — ABNORMAL HIGH (ref 1.7–7.7)
Neutrophils Relative %: 60 %
Platelet Count: 209 10*3/uL (ref 150–400)
RBC: 3.16 MIL/uL — ABNORMAL LOW (ref 3.87–5.11)
RDW: 17.2 % — ABNORMAL HIGH (ref 11.5–15.5)
WBC Count: 14.8 10*3/uL — ABNORMAL HIGH (ref 4.0–10.5)
nRBC: 0 % (ref 0.0–0.2)

## 2022-11-21 MED ORDER — ATROPINE SULFATE 1 MG/ML IV SOLN
0.5000 mg | Freq: Once | INTRAVENOUS | Status: AC | PRN
  Administered 2022-11-21: 0.5 mg via INTRAVENOUS
  Filled 2022-11-21: qty 1

## 2022-11-21 MED ORDER — SODIUM CHLORIDE 0.9 % IV SOLN: Freq: Once | INTRAVENOUS | Status: AC

## 2022-11-21 MED ORDER — LIDOCAINE-PRILOCAINE 2.5-2.5 % EX CREA: 1.0000 | TOPICAL_CREAM | CUTANEOUS | 0 refills | Status: DC | PRN

## 2022-11-21 MED ORDER — SODIUM CHLORIDE 0.9 % IV SOLN
100.0000 mg/m2 | Freq: Once | INTRAVENOUS | Status: AC
  Administered 2022-11-21: 220 mg via INTRAVENOUS
  Filled 2022-11-21: qty 11

## 2022-11-21 MED ORDER — SODIUM CHLORIDE 0.9 % IV SOLN
400.0000 mg/m2 | Freq: Once | INTRAVENOUS | Status: AC
  Administered 2022-11-21: 896 mg via INTRAVENOUS
  Filled 2022-11-21: qty 44.8

## 2022-11-21 MED ORDER — POTASSIUM CHLORIDE ER 10 MEQ PO TBCR: 10.0000 meq | EXTENDED_RELEASE_TABLET | Freq: Every day | ORAL | 0 refills | Status: DC

## 2022-11-21 MED ORDER — SODIUM CHLORIDE 0.9 % IV SOLN
2400.0000 mg/m2 | INTRAVENOUS | Status: DC
  Administered 2022-11-21: 5000 mg via INTRAVENOUS
  Filled 2022-11-21: qty 100

## 2022-11-21 MED ORDER — SODIUM CHLORIDE 0.9% FLUSH
10.0000 mL | INTRAVENOUS | Status: DC | PRN
  Administered 2022-11-21: 10 mL

## 2022-11-21 MED ORDER — PALONOSETRON HCL INJECTION 0.25 MG/5ML
0.2500 mg | Freq: Once | INTRAVENOUS | Status: AC
  Administered 2022-11-21: 0.25 mg via INTRAVENOUS
  Filled 2022-11-21: qty 5

## 2022-11-21 MED ORDER — SODIUM CHLORIDE 0.9 % IV SOLN: INTRAVENOUS | Status: DC

## 2022-11-21 MED ORDER — SODIUM CHLORIDE 0.9 % IV SOLN
10.0000 mg | Freq: Once | INTRAVENOUS | Status: AC
  Administered 2022-11-21: 10 mg via INTRAVENOUS
  Filled 2022-11-21: qty 10

## 2022-11-21 NOTE — Progress Notes (Signed)
[facility]   [phone] [phone]   Clinic Follow up Note   Patient Care Team: [name], MD as PCP - General (Family Medicine) [name], MD as Consulting Physician (General Surgery) [name], MD as Consulting Physician (Hematology) [name], MD as Consulting Physician (General Surgery) [name], MD as Consulting Physician (Hematology and Oncology)  Date of Service:  11/21/2022  CHIEF COMPLAINT: f/u of metastatic colon cancer   CURRENT THERAPY:  FOLFIRI/Beva q14 days, starting 04/06/22. Beva stopped due to PE on restaging CT 10/2022   ASSESSMENT:  Kim Castro is a 77 y.o. female with   Malignant neoplasm of ascending colon (HCC) pT3N2aM0 stage IIB, MSS, metastatic to b/l ovaries and peritoneum and liver, KRAS mutation G12V (+)  -Initially diagnosed in 08/2019, s/p resection with Dr Maisie Fus on 11/01/19. Path showed overall Stage IIIB cancer.  -s/p 6 months adjuvant FOLFOX, completed 06/01/20, oxaliplatin held for final 2 cycles due to neuropathy.  -s/p BSO and hysterectomy on 12/22/21 with Dr. Pricilla Holm. Path revealed metastatic moderately differentiated colonic adenocarcinoma involving both ovaries and peritoneum -she restarted FOLFOX on 01/24/22. -Due to disease progression, chemotherapy changed to second line FOLFIRI and bevacizumab on April 06, 2022. She has been tolerating moderately well, better now with dose reduction  -Restaging CT scan from 09/27/2022 showed continous response in liver and peritoneal metastasis, no other new lesions.   -She has been tolerating chemotherapy well overall due to dose reduction.  We discussed maintenance therapy option in future  -due to her PE in 10/2022, will hold beva for 3-6 months   Malignant neoplasm of female breast [facility]) -Dx in 05/2014, s/p left lumpectomy with Dr Johna Sheriff, adjuvant RT with Dr [name], and Anastrozole 08/2014 - 01/13/20 under Dr Pamelia Hoit.  -DEXA 09/11/20 T score  +1.2 normal.  -most recent mammogram on 10/31/2022 was negative.     DNR (do not resuscitate) The patient understands the goal of care is palliative.  -pt has agreed with DNR, order placed.     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  Overall stable.    Pulmonary embolism (HCC) -incidental findings of bilateral distal PEs on restaging CT 10/02/2022, pt was asymptomatic -doppler of LEs (-) DVT -continue Eliquis indefinitely       PLAN: -I refill lidocaine cream -I refill Potassium -lab reviewed -CMP-pending -will proceed with FOLFIRI today at same dose  -lab/flush and FOLFIRI 12/05/2022   SUMMARY OF ONCOLOGIC HISTORY: Oncology History Overview Note   Cancer Staging  Malignant neoplasm of female breast [facility]) Staging form: Breast, AJCC 7th Edition - Clinical stage from 06/11/2014: Stage 0 (Tis (DCIS), N0, M0) - Unsigned Staged by: Pathologist and managing physician Laterality: Left Estrogen receptor status: Positive Progesterone receptor status: Positive Stage used in treatment planning: Yes National guidelines used in treatment planning: Yes Type of national guideline used in treatment planning: NCCN - Pathologic stage from 07/03/2014: Stage Unknown (Tis (DCIS), NX, cM0) - Signed by [name], MD on 07/10/2014 Staged by: Pathologist Laterality: Left Estrogen receptor status: Positive Progesterone receptor status: Positive Stage used in treatment planning: Yes National guidelines used in treatment planning: Yes Type of national guideline used in treatment planning: NCCN Staging comments: Staged on final lumpectomy specimen by Dr. [name].  Right colon cancer Staging form: Colon and Rectum, AJCC 8th Edition - Pathologic stage from 11/01/2019: Stage IIIB (pT3, pN2a, cM0) - Signed by [name], MD on 11/29/2019  Stage prefix: Initial diagnosis Histologic grading system: 4 grade  system Histologic grade (G): G2 Residual tumor (R): R0 - None Tumor deposits (TD): Absent Perineural invasion (PNI): Absent Microsatellite instability (MSI): Stable KRAS mutation: Unknown NRAS mutation: Unknown BRAF mutation: Unknown     Malignant neoplasm of female breast (HCC)  05/29/2014 Initial Biopsy   Left breast needle core biopsy: Grade 2, DCIS with calcs. ER+ (100%), PR+ (96%).    06/04/2014 Initial Diagnosis   Left breast DCIS with calcifications, ER 100%, PR 96%   06/10/2014 Breast MRI   Left breast: 2.4 x 1.3 x 1.1 cm area of patchy non-mass enhancement upper outer quadrant includes postbiopsy seroma; Right breast: 1.2 cm previously biopsied stable benign fibroadenoma   06/12/2014 Procedure   Genetic counseling/testing: Identified 1 VUS on CHEK2 gene. Remainder of 17 gene panel tested negative and included: ATM, BARD1, BRCA 1/2, BRIP1, CDH1, CHEK2, EPCAM, MLH1, MSH2, MSH6, NBN, NF1, PALB2, PTEN, RAD50, RAD51C, RAD51D, STK11, and TP53.    07/01/2014 Surgery   Left breast lumpectomy ([facility]): Grade 1, DCIS, spanning 2.3 cm, 1 mm margin, ER 100%, PR 96%   07/31/2014 - 08/28/2014 Radiation Therapy   Adjuvant RT completed [name]). Left breast: Total dose 42.5 Gy over 17 fractions. Left breast boost: Total dose 7.5 Gy over 3 fractions.    09/14/2014 - 01/13/2020 Anti-estrogen oral therapy   Anastrazole 1mg  daily. Planned duration of treatment: 5 years [place]). Completed in 01/2020.    09/25/2014 Survivorship   Survivorship Care Plan given to patient and reviewed with her in person.    03/02/2021 Imaging   CT CAP  IMPRESSION: 1. No findings of active/recurrent malignancy. Partial right hemicolectomy. 2. Endometrial stripe remains mildly thickened, but endometrial biopsy in May was negative for malignancy. 3. Progressive endplate sclerosis and endplate irregularity at T2-3, probably due to degenerative endplate findings. If the has referable upper thoracic pain/symptoms  then thoracic spine MRI could be used for further workup. 4. Other imaging findings of potential clinical significance: Mild cardiomegaly. Aortic Atherosclerosis (ICD10-I70.0). Mild mitral valve calcification. Postoperative findings in the left breast with adjacent radiation port anteriorly in the left upper lobe. Tiny pulmonary nodules in the left lower lobe are unchanged from earliest available comparison of 10/17/2019 and probably benign although may merit surveillance. Multilevel lumbar impingement. Mild pelvic floor laxity.   Malignant neoplasm of ascending colon (HCC)  09/19/2019 Imaging   CT AP W contrast 09/19/19  IMPRESSION Fullness in the cecum, cannot exclude a mass. No evidence for metastatic disease is identified.    09/24/2019 Procedure   Colonoscopy by Dr [name] 09/24/19 IMPRESSION 1. The colon was redundant  2. Mild diverticulosis was noted through the entire examined colon 3. Single 12mm polyp was found in the ascending colon; polypectomy was performed using snare cautery and biopsy forceps 4. Mild diverticulosis was notes in the descending colon and sigmoid colon.  5. Single polyp was found in the sigmoid colon, polypectomy was performed with cold forceps.  6. Single polyp was found in the rectosigmoid colon; polypectomy was performed with cold snare  7. Small internal hemorrhoids  8. Large mass was found at the cecum; multiple biopsies of the area were performed using cold forceps; injection (tattooing) was performed distal to the mass.    09/24/2019 Initial Biopsy   INTERPRETATION AND DIAGNOSIS:  A. Cecum, biopsy:  Invasive moderately differentiated adenocarcinoma.  see comment  B. Polyp @ ascending colon, polypectomy:  Tubular Adenoma  C. Polyp @ sigmoid colon Polypectomy:  hyperplastic polyp.  D. Polyp @ rectosigmoid colon, Polypectomy:  Hyperplastic Polyp      10/16/2019 Imaging   CT Chest IMPRESSION: 1. Multiple small pulmonary nodules measuring 5 mm or less  in size in the lungs. These are nonspecific and are typically considered statistically likely benign. However, given the patient's history of primary malignancy, close attention on follow-up studies is recommended to ensure stability. 2. Aortic atherosclerosis, in addition to right coronary artery disease. Assessment for potential risk factor modification, dietary therapy or pharmacologic therapy may be warranted, if clinically indicated. 3. There are calcifications of the aortic valve and mitral annulus. Echocardiographic correlation for evaluation of potential valvular dysfunction may be warranted if clinically indicated. 4. Small hiatal hernia.   Aortic Atherosclerosis (ICD10-I70.0).   11/01/2019 Initial Diagnosis   Colon cancer (HCC)   11/01/2019 Surgery   LAPAROSCOPIC PARTIAL COLECTOMY by Dr Maisie Fus and Dr Michaell Cowing   11/01/2019 Pathology Results   FINAL MICROSCOPIC DIAGNOSIS:   A. COLON, PROXIMAL RIGHT, COLECTOMY:  - Invasive colonic adenocarcinoma, 5 cm.  - Tumor invades through the muscularis propria into pericolonic tissues.   - Margins of resection are not involved.  - Metastatic carcinoma in (5) of (13) lymph nodes.  - See oncology table.    MSI Stable  Mismatch repair normal  MLH1 - Preserved nuclear expression (greater 50% tumor expression) MSH2 - Preserved nuclear expression (greater 50% tumor expression) MSH6 - Preserved nuclear expression (greater 50% tumor expression) PMS2 - Preserved nuclear expression (greater 50% tumor expression)   11/01/2019 Cancer Staging   Staging form: Colon and Rectum, AJCC 8th Edition - Pathologic stage from 11/01/2019: Stage IIIB (pT3, pN2a, cM0) - Signed by [name], MD on 11/29/2019   12/10/2019 Procedure   PAC placed 12/10/19   12/17/2019 - 06/01/2020 Chemotherapy   FOLFOX q2weeks starting in 2 weeks starting 12/17/19. Held 01/27/20-02/10/20 due to b/l PE. Oxaliplatin held C11-12 due to neuropathy. Completed on 06/01/20   03/02/2021 Imaging    CT CAP  IMPRESSION: 1. No findings of active/recurrent malignancy. Partial right hemicolectomy. 2. Endometrial stripe remains mildly thickened, but endometrial biopsy in May was negative for malignancy. 3. Progressive endplate sclerosis and endplate irregularity at T2-3, probably due to degenerative endplate findings. If the has referable upper thoracic pain/symptoms then thoracic spine MRI could be used for further workup. 4. Other imaging findings of potential clinical significance: Mild cardiomegaly. Aortic Atherosclerosis (ICD10-I70.0). Mild mitral valve calcification. Postoperative findings in the left breast with adjacent radiation port anteriorly in the left upper lobe. Tiny pulmonary nodules in the left lower lobe are unchanged from earliest available comparison of 10/17/2019 and probably benign although may merit surveillance. Multilevel lumbar impingement. Mild pelvic floor laxity.   08/26/2021 Imaging   EXAM: CT CHEST, ABDOMEN, AND PELVIS WITH CONTRAST  IMPRESSION: 1. Stable examination without new or progressive findings to suggest local recurrence or metastatic disease within the chest, abdomen, or pelvis. 2. Hepatomegaly with hepatic steatosis. 3. Sigmoid colonic diverticulosis without findings of acute diverticulitis. 4. Similar prominent endplate sclerosis and irregularity at T2-T3 is most consistent with Modic type endplate degenerative changes. However, if patient has referable upper thoracic pain consider further workup with thoracic spine MRI. 5. Similar thickening of the endometrial stripe measuring up to 8 mm, which was previously biopsied with results negative for malignancy. 6. Aortic Atherosclerosis (ICD10-I70.0).   11/10/2021 PET scan   IMPRESSION: 1. LEFT ovary is increased in size and is intensely hypermetabolic. While physiologic hypermetabolic ovarian tissue is not uncommon, the enlargement and asymmetric activity  warrants further evaluation.  Consider contrast pelvic MRI vs tissue sampling. 2. No evidence of metastatic colorectal carcinoma otherwise. 3. Post RIGHT hemicolectomy anatomy. 4. Evidence of radiation change in the LEFT upper lobe (remote breast cancer).     12/22/2021 Relapse/Recurrence    FINAL MICROSCOPIC DIAGNOSIS:   A. LEFT OVARY AND FALLOPIAN TUBE, SALPINGO OOPHORECTOMY:  - Metastatic moderately differentiated colonic adenocarcinoma involving left ovary  - Focal ovarian stromal calcification  - Segment of benign fallopian tube   B. UTERUS WITH RIGHT FALLOPIAN TUBE AND OVARY, HYSTERECTOMY AND SALPINGO-OOPHORECTOMY:  - Metastatic moderately differentiated colonic adenocarcinoma involving right ovary  - Focal invasive extensive adenomyosis  - Benign endometrial polyps  - Benign proliferative phase endometrium  - Hydrosalpinx of right fallopian tube  - Focal ovarian stromal calcification   C. PERITONEAL DEPOSITS, ANTERIOR CUL DE SAC, BIOPSY:  - Metastatic mucinous adenocarcinoma, consistent with colorectal primary    COMMENT:  Immunohistochemical stains show that the tumor cells are positive for CK20 and CDX2 while they are negative for CK7 and PAX8, consistent with above interpretation.    01/24/2022 - 03/23/2022 Chemotherapy   Patient is on Treatment Plan : COLORECTAL FOLFOX q14d x 3 months     04/01/2022 Imaging   CT AP IMPRESSION: 1. New omental metastatic disease. 2. Vague hypoattenuating lesion in the inferior right hepatic lobe is new and also worrisome for metastatic disease. 3. Similar small right lower lobe nodules. Recommend continued attention on follow-up. 4. Steatotic enlarged liver. 5.  Aortic atherosclerosis (ICD10-I70.0).   04/06/2022 -  Chemotherapy   Patient is on Treatment Plan : COLORECTAL FOLFIRI + Bevacizumab q14d     04/21/2022 Imaging    IMPRESSION: 1. Stable chest CT. No evidence of metastatic disease. 2. Stable small pulmonary nodules, considered benign based  on stability. 3. Stable postsurgical changes in the left breast and anterior left upper lobe radiation changes. 4. Aortic Atherosclerosis (ICD10-I70.0) and Emphysema (ICD10-J43.9).   04/21/2022 Imaging    IMPRESSION: 1. Small enhancing lesion inferiorly in the right hepatic lobe is typical of metastatic disease and stable from recent abdominal CT. No other definite liver lesions are identified. Mild hepatic steatosis. 2. No other significant abdominal findings. 3. Omental nodularity seen on CT is not well visualized by MRI.    Imaging     06/23/2022 Imaging    IMPRESSION: 1. Hypodense lesion of the inferior right lobe of the liver, hepatic segment VI is diminished in size, consistent with treatment response. 2. Tiny peritoneal and omental nodules identified by prior examination are diminished in size, consistent with treatment response. 3. Multiple tiny pulmonary nodules unchanged, most likely benign and incidental, however continued attention on follow-up warranted in the setting of known metastatic disease. 4. No evidence of new metastatic disease in the chest, abdomen, or pelvis. 5. Status post partial right hemicolectomy and reanastomosis. 6. Diffuse mosaic attenuation of the airspaces, consistent with small airways disease. 7. Hepatomegaly.      INTERVAL HISTORY:  Kim Castro is here for a follow up of metastatic colon cancer. She was last seen by me on 11/07/2022. She presents to the clinic alone. Pt state that the last cycle of chemo was bout the same. Pt state that he neuropathy in her hands and feet  are about the same. She state that she has been using the cane for about 6 months when she walks a distance due to back issues. No BM issues, all tolerable.    All other systems were reviewed with the  patient and are negative.  MEDICAL HISTORY:  Past Medical History:  Diagnosis Date   Aortic atherosclerosis (HCC)    Arthritis    feet, lower back   Basal  cell carcinoma    arm   Breast cancer of upper-outer quadrant of left female breast (HCC) 06/04/2014   Cataract    immature on the left   Colon cancer (HCC) 08/2019   Diabetes mellitus without complication (HCC)    Diverticulosis    Dizziness    > 59yrs ago;took Antivert    Family history of anesthesia complication    sister slow to wake up with anesthesia   Family history of breast cancer    Family history of colon cancer    Family history of uterine cancer    GERD (gastroesophageal reflux disease)    takes occasional TUMs   History of bronchitis    > 74yrs ago   History of colon polyps    History of hiatal hernia    Small noted on CT   History of pulmonary embolus (PE)    Hypertension    takes Losartan daily and HCTZ   Iron deficiency anemia    Joint pain    Numbness    to toes on each foot   Peripheral neuropathy    feet and toes   Personal history of radiation therapy    Pulmonary nodules    Noted on CT   Radiation 07/31/14-08/28/14   Left Breast 20 fxs   Seasonal allergies    takes Claritin prn   Urinary frequency    Vitamin D deficiency    takes VIt D daily    SURGICAL HISTORY: Past Surgical History:  Procedure Laterality Date   BREAST BIOPSY Bilateral    BREAST LUMPECTOMY Left    BREAST LUMPECTOMY WITH RADIOACTIVE SEED LOCALIZATION Left 07/01/2014   Procedure: LEFT BREAST LUMPECTOMY WITH RADIOACTIVE SEED LOCALIZATION;  Surgeon: Glenna Fellows, MD;  Location: [facility];  Service: General;  Laterality: Left;   CATARACT EXTRACTION Right    COLONOSCOPY  09/24/2019   [place]   LAPAROSCOPIC PARTIAL COLECTOMY N/A 11/01/2019   Procedure: LAPAROSCOPIC PARTIAL COLECTOMY;  Surgeon: [name], MD;  Location: [facility];  Service: General;  Laterality: N/A;   POLYPECTOMY     PORTACATH PLACEMENT N/A 12/10/2019   Procedure: INSERTION PORT-A-CATH ULTRASOUND GUIDED IN RIGHT IJ;  Surgeon: [name], MD;  Location: [facility];  Service: General;   Laterality: N/A;   ROBOTIC ASSISTED TOTAL HYSTERECTOMY Bilateral 12/22/2021   Procedure: XI ROBOTIC ASSISTED TOTAL HYSTERECTOMY WITH BILATERAL SALPINGO OOPHORECTOMY;  Surgeon: Carver Fila, MD;  Location: [facility];  Service: Gynecology;  Laterality: Bilateral;   TOTAL KNEE ARTHROPLASTY Left 10/24/2012   Procedure: TOTAL KNEE ARTHROPLASTY;  Surgeon: Nestor Lewandowsky, MD;  Location: [facility];  Service: Orthopedics;  Laterality: Left;   TOTAL KNEE ARTHROPLASTY Right 01/09/2013   Procedure: TOTAL KNEE ARTHROPLASTY;  Surgeon: Nestor Lewandowsky, MD;  Location: [facility];  Service: Orthopedics;  Laterality: Right;   TUBAL LIGATION      I have reviewed the social history and family history with the patient and they are unchanged from previous note.  ALLERGIES:  is allergic to oxycodone.  MEDICATIONS:  Current Outpatient Medications  Medication Sig Dispense Refill   lidocaine-prilocaine (EMLA) cream Apply 1 Application topically as needed. 30 g 0   apixaban (ELIQUIS) 5 MG TABS tablet Take 1 tablet (5 mg total) by mouth 2 (two) times daily. 60 tablet 2  b complex vitamins capsule Take 1 capsule by mouth daily.     Cholecalciferol (VITAMIN D) 2000 UNITS CAPS Take 2,000 Units by mouth daily.      ibuprofen (ADVIL) 200 MG tablet Take 200 mg by mouth daily as needed (pain).     loratadine (CLARITIN) 10 MG tablet Take 10 mg by mouth daily as needed for allergies.     losartan (COZAAR) 25 MG tablet Take 1 tablet (25 mg total) by mouth daily. 90 tablet 3   metFORMIN (GLUCOPHAGE) 500 MG tablet Take 1 tablet (500 mg total) by mouth in the morning and at bedtime. 180 tablet 3   Multiple Vitamins-Minerals (MULTIVITAMIN WITH MINERALS) tablet Take 1 tablet by mouth daily.     omeprazole (PRILOSEC) 20 MG capsule Take 20 mg by mouth daily before breakfast.      ondansetron (ZOFRAN) 8 MG tablet Take 1 tablet (8 mg total) by mouth every 8 (eight) hours as needed for nausea or vomiting. Start on the third day after  chemotherapy. 30 tablet 1   potassium chloride (KLOR-CON) 10 MEQ tablet Take 1 tablet (10 mEq total) by mouth daily. 30 tablet 0   pravastatin (PRAVACHOL) 10 MG tablet Take 1 tablet (10 mg total) by mouth daily. 90 tablet 3   pregabalin (LYRICA) 25 MG capsule Take 1 capsule (25 mg total) by mouth every morning. Take in addition to 50 mg at bedtime 60 capsule 2   pregabalin (LYRICA) 50 MG capsule Take 1 capsule (50 mg total) by mouth at bedtime. Take in addition to 25 mg in the morning 60 capsule 2   prochlorperazine (COMPAZINE) 10 MG tablet Take 1 tablet (10 mg total) by mouth every 6 (six) hours as needed for nausea or vomiting. 30 tablet 1   triamcinolone ointment (KENALOG) 0.5 % Apply 1 Application topically 2 (two) times daily. 30 g 0   No current facility-administered medications for this visit.   Facility-Administered Medications Ordered in Other Visits  Medication Dose Route Frequency Provider Last Rate Last Admin   sodium chloride flush (NS) 0.9 % injection 10 mL  10 mL Intracatheter PRN [name], MD   10 mL at 11/21/22 0742    PHYSICAL EXAMINATION: ECOG PERFORMANCE STATUS: 1 - Symptomatic but completely ambulatory  Vitals:   11/21/22 0814  BP: (!) 130/53  Pulse: 60  Resp: 17  Temp: 98.2 F (36.8 C)  SpO2: 100%   Wt Readings from Last 3 Encounters:  11/21/22 215 lb 14.4 oz (97.9 kg)  11/15/22 208 lb 6.4 oz (94.5 kg)  11/07/22 213 lb 6.4 oz (96.8 kg)     GENERAL:alert, no distress and comfortable SKIN: skin color normal, no rashes or significant lesions EYES: normal, Conjunctiva are pink and non-injected, sclera clear  NEURO: alert & oriented x 3 with fluent speech LUNGS: (-)clear to auscultation and percussion with normal breathing effort  LABORATORY DATA:  I have reviewed the data as listed    Latest Ref Rng & Units 11/21/2022    7:39 AM 11/07/2022    8:13 AM 10/24/2022    7:55 AM  CBC  WBC 4.0 - 10.5 K/uL 14.8  13.9  13.2   Hemoglobin 12.0 - 15.0 g/dL 65.7  84.6   96.2   Hematocrit 36.0 - 46.0 % 32.3  34.0  35.0   Platelets 150 - 400 K/uL 209  243  197         Latest Ref Rng & Units 11/15/2022   11:07 AM 11/07/2022  8:13 AM 10/24/2022    7:55 AM  CMP  Glucose 70 - 99 mg/dL 621  308  657   BUN 8 - 23 mg/dL  20  16   Creatinine 8.46 - 1.00 mg/dL  9.62  9.52   Sodium 841 - 145 mmol/L  141  144   Potassium 3.5 - 5.1 mmol/L  4.7  3.9   Chloride 98 - 111 mmol/L  107  111   CO2 22 - 32 mmol/L  27  25   Calcium 8.9 - 10.3 mg/dL  9.2  9.0   Total Protein 6.5 - 8.1 g/dL  6.6  6.2   Total Bilirubin 0.3 - 1.2 mg/dL  0.6  0.7   Alkaline Phos 38 - 126 U/L  137  142   AST 15 - 41 U/L  22  18   ALT 0 - 44 U/L  15  14       RADIOGRAPHIC STUDIES: I have personally reviewed the radiological images as listed and agreed with the findings in the report. No results found.    No orders of the defined types were placed in this encounter.  All questions were answered. The patient knows to call the clinic with any problems, questions or concerns. No barriers to learning was detected. The total time spent in the appointment was 25 minutes.     [name], MD 11/21/2022   Carolin Coy, CMA, am acting as scribe for [name], MD.   I have reviewed the above documentation for accuracy and completeness, and I agree with the above.

## 2022-11-21 NOTE — Patient Instructions (Signed)
[facility]  Discharge Instructions: Thank you for choosing [facility] to provide your oncology and hematology care.   If you have a lab appointment with the Cancer Center, please go directly to the Cancer Center and check in at the registration area.   Wear comfortable clothing and clothing appropriate for easy access to any Portacath or PICC line.   We strive to give you quality time with your provider. You may need to reschedule your appointment if you arrive late (15 or more minutes).  Arriving late affects you and other patients whose appointments are after yours.  Also, if you miss three or more appointments without notifying the office, you may be dismissed from the clinic at the provider's discretion.      For prescription refill requests, have your pharmacy contact our office and allow 72 hours for refills to be completed.    Today you received the following chemotherapy and/or immunotherapy agents: Irinotecan/Leucovorin/5FU      To help prevent nausea and vomiting after your treatment, we encourage you to take your nausea medication as directed.  BELOW ARE SYMPTOMS THAT SHOULD BE REPORTED IMMEDIATELY: *FEVER GREATER THAN 100.4 F (38 C) OR HIGHER *CHILLS OR SWEATING *NAUSEA AND VOMITING THAT IS NOT CONTROLLED WITH YOUR NAUSEA MEDICATION *UNUSUAL SHORTNESS OF BREATH *UNUSUAL BRUISING OR BLEEDING *URINARY PROBLEMS (pain or burning when urinating, or frequent urination) *BOWEL PROBLEMS (unusual diarrhea, constipation, pain near the anus) TENDERNESS IN MOUTH AND THROAT WITH OR WITHOUT PRESENCE OF ULCERS (sore throat, sores in mouth, or a toothache) UNUSUAL RASH, SWELLING OR PAIN  UNUSUAL VAGINAL DISCHARGE OR ITCHING   Items with * indicate a potential emergency and should be followed up as soon as possible or go to the Emergency Department if any problems should occur.  Please show the CHEMOTHERAPY ALERT CARD or IMMUNOTHERAPY  ALERT CARD at check-in to the Emergency Department and triage nurse.  Should you have questions after your visit or need to cancel or reschedule your appointment, please contact [facility]  Dept: [phone]  and follow the prompts.  Office hours are 8:00 a.m. to 4:30 p.m. Monday - Friday. Please note that voicemails left after 4:00 p.m. may not be returned until the following business day.  We are closed weekends and major holidays. You have access to a nurse at all times for urgent questions. Please call the main number to the clinic Dept: [phone] and follow the prompts.   For any non-urgent questions, you may also contact your provider using MyChart. We now offer e-Visits for anyone 3 and older to request care online for non-urgent symptoms. For details visit [email or web address].   Also download the MyChart app! Go to the app store, search "MyChart", open the app, select [facility], and log in with your MyChart username and password.

## 2022-11-23 ENCOUNTER — Inpatient Hospital Stay: Payer: Medicare Other

## 2022-11-23 ENCOUNTER — Other Ambulatory Visit: Payer: Self-pay

## 2022-11-23 VITALS — BP 143/60 | HR 63 | Temp 98.8°F | Resp 18

## 2022-11-23 DIAGNOSIS — Z66 Do not resuscitate: Secondary | ICD-10-CM | POA: Diagnosis not present

## 2022-11-23 DIAGNOSIS — Z7901 Long term (current) use of anticoagulants: Secondary | ICD-10-CM | POA: Diagnosis not present

## 2022-11-23 DIAGNOSIS — C182 Malignant neoplasm of ascending colon: Secondary | ICD-10-CM | POA: Diagnosis not present

## 2022-11-23 DIAGNOSIS — C7963 Secondary malignant neoplasm of bilateral ovaries: Secondary | ICD-10-CM | POA: Diagnosis not present

## 2022-11-23 DIAGNOSIS — Z90722 Acquired absence of ovaries, bilateral: Secondary | ICD-10-CM | POA: Diagnosis not present

## 2022-11-23 DIAGNOSIS — Z853 Personal history of malignant neoplasm of breast: Secondary | ICD-10-CM | POA: Diagnosis not present

## 2022-11-23 DIAGNOSIS — G620 Drug-induced polyneuropathy: Secondary | ICD-10-CM | POA: Diagnosis not present

## 2022-11-23 DIAGNOSIS — Z90710 Acquired absence of both cervix and uterus: Secondary | ICD-10-CM | POA: Diagnosis not present

## 2022-11-23 DIAGNOSIS — C786 Secondary malignant neoplasm of retroperitoneum and peritoneum: Secondary | ICD-10-CM | POA: Diagnosis not present

## 2022-11-23 DIAGNOSIS — Z5111 Encounter for antineoplastic chemotherapy: Secondary | ICD-10-CM | POA: Diagnosis not present

## 2022-11-23 DIAGNOSIS — Z5189 Encounter for other specified aftercare: Secondary | ICD-10-CM | POA: Diagnosis not present

## 2022-11-23 DIAGNOSIS — C787 Secondary malignant neoplasm of liver and intrahepatic bile duct: Secondary | ICD-10-CM | POA: Diagnosis not present

## 2022-11-23 DIAGNOSIS — I2699 Other pulmonary embolism without acute cor pulmonale: Secondary | ICD-10-CM | POA: Diagnosis not present

## 2022-11-23 MED ORDER — PEGFILGRASTIM-CBQV 6 MG/0.6ML ~~LOC~~ SOSY
6.0000 mg | PREFILLED_SYRINGE | Freq: Once | SUBCUTANEOUS | Status: AC
  Administered 2022-11-23: 6 mg via SUBCUTANEOUS
  Filled 2022-11-23: qty 0.6

## 2022-11-23 MED ORDER — HEPARIN SOD (PORK) LOCK FLUSH 100 UNIT/ML IV SOLN
500.0000 [IU] | Freq: Once | INTRAVENOUS | Status: AC | PRN
  Administered 2022-11-23: 500 [IU]

## 2022-11-23 MED ORDER — SODIUM CHLORIDE 0.9% FLUSH
10.0000 mL | INTRAVENOUS | Status: DC | PRN
  Administered 2022-11-23: 10 mL

## 2022-11-23 NOTE — Patient Instructions (Signed)

## 2022-12-02 MED FILL — Dexamethasone Sodium Phosphate Inj 100 MG/10ML: INTRAMUSCULAR | Qty: 1 | Status: AC

## 2022-12-04 NOTE — Assessment & Plan Note (Signed)
-  incidental findings of bilateral distal PEs on restaging CT 10/02/2022, pt was asymptomatic -doppler of LEs (-) DVT -continue Eliquis indefinitely  

## 2022-12-04 NOTE — Assessment & Plan Note (Signed)
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 Path showed overall Stage IIIB cancer.  -s/p 6 months adjuvant FOLFOX, completed 06/01/20, oxaliplatin held for final 2 cycles due to neuropathy.  -s/p BSO and hysterectomy on 12/22/21 with Dr. Pricilla Holm. Path revealed metastatic moderately differentiated colonic adenocarcinoma involving both ovaries and peritoneum -she restarted FOLFOX on 01/24/22. -Due to disease progression, chemotherapy changed to second line FOLFIRI and bevacizumab on April 06, 2022. She has been tolerating moderately well, better now with dose reduction  -Restaging CT scan from 09/27/2022 showed continous response in liver and peritoneal metastasis, no other new lesions.   -We discussed maintenance therapy option in future  -due to her PE in 10/2022, will hold beva for 3-6 months

## 2022-12-04 NOTE — Assessment & Plan Note (Signed)
-  Secondary to oxaliplatin -She is on Lyrica and B complex, continue to monitor.  Overall stable.   

## 2022-12-04 NOTE — Assessment & Plan Note (Signed)
-  The patient understands the goal of care is palliative.  -pt has agreed with DNR, order placed.  

## 2022-12-04 NOTE — Assessment & Plan Note (Signed)
-

## 2022-12-05 ENCOUNTER — Inpatient Hospital Stay: Payer: Medicare Other | Attending: Hematology

## 2022-12-05 ENCOUNTER — Inpatient Hospital Stay: Payer: Medicare Other

## 2022-12-05 ENCOUNTER — Inpatient Hospital Stay (HOSPITAL_BASED_OUTPATIENT_CLINIC_OR_DEPARTMENT_OTHER): Payer: Medicare Other | Admitting: Hematology

## 2022-12-05 ENCOUNTER — Encounter: Payer: Self-pay | Admitting: Hematology

## 2022-12-05 VITALS — BP 142/58 | HR 61 | Temp 98.3°F | Resp 15 | Ht 65.5 in | Wt 216.6 lb

## 2022-12-05 VITALS — BP 113/44 | HR 57 | Temp 98.2°F | Resp 17

## 2022-12-05 DIAGNOSIS — Z66 Do not resuscitate: Secondary | ICD-10-CM | POA: Diagnosis not present

## 2022-12-05 DIAGNOSIS — C50919 Malignant neoplasm of unspecified site of unspecified female breast: Secondary | ICD-10-CM | POA: Diagnosis not present

## 2022-12-05 DIAGNOSIS — Z5111 Encounter for antineoplastic chemotherapy: Secondary | ICD-10-CM | POA: Insufficient documentation

## 2022-12-05 DIAGNOSIS — T451X5A Adverse effect of antineoplastic and immunosuppressive drugs, initial encounter: Secondary | ICD-10-CM

## 2022-12-05 DIAGNOSIS — I2699 Other pulmonary embolism without acute cor pulmonale: Secondary | ICD-10-CM | POA: Diagnosis not present

## 2022-12-05 DIAGNOSIS — Z7901 Long term (current) use of anticoagulants: Secondary | ICD-10-CM | POA: Insufficient documentation

## 2022-12-05 DIAGNOSIS — C182 Malignant neoplasm of ascending colon: Secondary | ICD-10-CM

## 2022-12-05 DIAGNOSIS — I2694 Multiple subsegmental pulmonary emboli without acute cor pulmonale: Secondary | ICD-10-CM

## 2022-12-05 DIAGNOSIS — Z90722 Acquired absence of ovaries, bilateral: Secondary | ICD-10-CM | POA: Diagnosis not present

## 2022-12-05 DIAGNOSIS — Z170 Estrogen receptor positive status [ER+]: Secondary | ICD-10-CM

## 2022-12-05 DIAGNOSIS — C563 Malignant neoplasm of bilateral ovaries: Secondary | ICD-10-CM | POA: Diagnosis not present

## 2022-12-05 DIAGNOSIS — G620 Drug-induced polyneuropathy: Secondary | ICD-10-CM

## 2022-12-05 DIAGNOSIS — Z90710 Acquired absence of both cervix and uterus: Secondary | ICD-10-CM | POA: Diagnosis not present

## 2022-12-05 DIAGNOSIS — C786 Secondary malignant neoplasm of retroperitoneum and peritoneum: Secondary | ICD-10-CM | POA: Diagnosis not present

## 2022-12-05 DIAGNOSIS — Z5189 Encounter for other specified aftercare: Secondary | ICD-10-CM | POA: Diagnosis not present

## 2022-12-05 DIAGNOSIS — C787 Secondary malignant neoplasm of liver and intrahepatic bile duct: Secondary | ICD-10-CM | POA: Diagnosis not present

## 2022-12-05 DIAGNOSIS — Z853 Personal history of malignant neoplasm of breast: Secondary | ICD-10-CM | POA: Insufficient documentation

## 2022-12-05 DIAGNOSIS — Z95828 Presence of other vascular implants and grafts: Secondary | ICD-10-CM

## 2022-12-05 LAB — CMP (CANCER CENTER ONLY)
ALT: 16 U/L (ref 0–44)
AST: 24 U/L (ref 15–41)
Albumin: 3.7 g/dL (ref 3.5–5.0)
Alkaline Phosphatase: 135 U/L — ABNORMAL HIGH (ref 38–126)
Anion gap: 7 (ref 5–15)
BUN: 16 mg/dL (ref 8–23)
CO2: 25 mmol/L (ref 22–32)
Calcium: 8.4 mg/dL — ABNORMAL LOW (ref 8.9–10.3)
Chloride: 111 mmol/L (ref 98–111)
Creatinine: 0.85 mg/dL (ref 0.44–1.00)
GFR, Estimated: >60 mL/min (ref >60)
Glucose, Bld: 112 mg/dL — ABNORMAL HIGH (ref 70–99)
Potassium: 3.9 mmol/L (ref 3.5–5.1)
Sodium: 143 mmol/L (ref 135–145)
Total Bilirubin: 0.6 mg/dL (ref 0.3–1.2)
Total Protein: 5.9 g/dL — ABNORMAL LOW (ref 6.5–8.1)

## 2022-12-05 LAB — CBC WITH DIFFERENTIAL (CANCER CENTER ONLY)
Abs Immature Granulocytes: 1.62 10*3/uL — ABNORMAL HIGH (ref 0.00–0.07)
Basophils Absolute: 0 10*3/uL (ref 0.0–0.1)
Basophils Relative: 0 %
Eosinophils Absolute: 0.3 10*3/uL (ref 0.0–0.5)
Eosinophils Relative: 2 %
HCT: 31.9 % — ABNORMAL LOW (ref 36.0–46.0)
Hemoglobin: 10.3 g/dL — ABNORMAL LOW (ref 12.0–15.0)
Immature Granulocytes: 10 %
Lymphocytes Relative: 23 %
Lymphs Abs: 3.7 10*3/uL (ref 0.7–4.0)
MCH: 33.4 pg (ref 26.0–34.0)
MCHC: 32.3 g/dL (ref 30.0–36.0)
MCV: 103.6 fL — ABNORMAL HIGH (ref 80.0–100.0)
Monocytes Absolute: 1.1 10*3/uL — ABNORMAL HIGH (ref 0.1–1.0)
Monocytes Relative: 7 %
Neutro Abs: 9.4 10*3/uL — ABNORMAL HIGH (ref 1.7–7.7)
Neutrophils Relative %: 58 %
Platelet Count: 202 10*3/uL (ref 150–400)
RBC: 3.08 MIL/uL — ABNORMAL LOW (ref 3.87–5.11)
RDW: 16.9 % — ABNORMAL HIGH (ref 11.5–15.5)
Smear Review: NORMAL
WBC Count: 16.1 10*3/uL — ABNORMAL HIGH (ref 4.0–10.5)
nRBC: 0.2 % (ref 0.0–0.2)

## 2022-12-05 MED ORDER — SODIUM CHLORIDE 0.9 % IV SOLN: Freq: Once | INTRAVENOUS | Status: DC

## 2022-12-05 MED ORDER — ATROPINE SULFATE 1 MG/ML IV SOLN
0.5000 mg | Freq: Once | INTRAVENOUS | Status: AC | PRN
  Administered 2022-12-05: 0.5 mg via INTRAVENOUS
  Filled 2022-12-05: qty 1

## 2022-12-05 MED ORDER — SODIUM CHLORIDE 0.9 % IV SOLN
10.0000 mg | Freq: Once | INTRAVENOUS | Status: AC
  Administered 2022-12-05: 10 mg via INTRAVENOUS
  Filled 2022-12-05: qty 10

## 2022-12-05 MED ORDER — SODIUM CHLORIDE 0.9 % IV SOLN
400.0000 mg/m2 | Freq: Once | INTRAVENOUS | Status: AC
  Administered 2022-12-05: 896 mg via INTRAVENOUS
  Filled 2022-12-05: qty 44.8

## 2022-12-05 MED ORDER — SODIUM CHLORIDE 0.9 % IV SOLN
2400.0000 mg/m2 | INTRAVENOUS | Status: DC
  Administered 2022-12-05: 5000 mg via INTRAVENOUS
  Filled 2022-12-05: qty 100

## 2022-12-05 MED ORDER — SODIUM CHLORIDE 0.9 % IV SOLN
100.0000 mg/m2 | Freq: Once | INTRAVENOUS | Status: AC
  Administered 2022-12-05: 220 mg via INTRAVENOUS
  Filled 2022-12-05: qty 11

## 2022-12-05 MED ORDER — SODIUM CHLORIDE 0.9% FLUSH
10.0000 mL | INTRAVENOUS | Status: DC | PRN
  Administered 2022-12-05: 10 mL

## 2022-12-05 MED ORDER — PALONOSETRON HCL INJECTION 0.25 MG/5ML
0.2500 mg | Freq: Once | INTRAVENOUS | Status: AC
  Administered 2022-12-05: 0.25 mg via INTRAVENOUS
  Filled 2022-12-05: qty 5

## 2022-12-05 NOTE — Patient Instructions (Signed)
[facility]  Discharge Instructions: Thank you for choosing [facility] to provide your oncology and hematology care.   If you have a lab appointment with the Cancer Center, please go directly to the Cancer Center and check in at the registration area.   Wear comfortable clothing and clothing appropriate for easy access to any Portacath or PICC line.   We strive to give you quality time with your provider. You may need to reschedule your appointment if you arrive late (15 or more minutes).  Arriving late affects you and other patients whose appointments are after yours.  Also, if you miss three or more appointments without notifying the office, you may be dismissed from the clinic at the provider's discretion.      For prescription refill requests, have your pharmacy contact our office and allow 72 hours for refills to be completed.    Today you received the following chemotherapy and/or immunotherapy agents: irinotecan, leucovorin, fluorouracil      To help prevent nausea and vomiting after your treatment, we encourage you to take your nausea medication as directed.  BELOW ARE SYMPTOMS THAT SHOULD BE REPORTED IMMEDIATELY: *FEVER GREATER THAN 100.4 F (38 C) OR HIGHER *CHILLS OR SWEATING *NAUSEA AND VOMITING THAT IS NOT CONTROLLED WITH YOUR NAUSEA MEDICATION *UNUSUAL SHORTNESS OF BREATH *UNUSUAL BRUISING OR BLEEDING *URINARY PROBLEMS (pain or burning when urinating, or frequent urination) *BOWEL PROBLEMS (unusual diarrhea, constipation, pain near the anus) TENDERNESS IN MOUTH AND THROAT WITH OR WITHOUT PRESENCE OF ULCERS (sore throat, sores in mouth, or a toothache) UNUSUAL RASH, SWELLING OR PAIN  UNUSUAL VAGINAL DISCHARGE OR ITCHING   Items with * indicate a potential emergency and should be followed up as soon as possible or go to the Emergency Department if any problems should occur.  Please show the CHEMOTHERAPY ALERT CARD or  IMMUNOTHERAPY ALERT CARD at check-in to the Emergency Department and triage nurse.  Should you have questions after your visit or need to cancel or reschedule your appointment, please contact [facility]  Dept: [phone]  and follow the prompts.  Office hours are 8:00 a.m. to 4:30 p.m. Monday - Friday. Please note that voicemails left after 4:00 p.m. may not be returned until the following business day.  We are closed weekends and major holidays. You have access to a nurse at all times for urgent questions. Please call the main number to the clinic Dept: [phone] and follow the prompts.   For any non-urgent questions, you may also contact your provider using MyChart. We now offer e-Visits for anyone 18 and older to request care online for non-urgent symptoms. For details visit [email or web address].   Also download the MyChart app! Go to the app store, search "MyChart", open the app, select [facility], and log in with your MyChart username and password.   

## 2022-12-05 NOTE — Progress Notes (Signed)
[facility]   Telephone:(336) [phone] Fax:(336) [phone]   Clinic Follow up Note   Patient Care Team: [name], MD as PCP - General (Family Medicine) [name], MD as Consulting Physician (General Surgery) [name], MD as Consulting Physician (Hematology) [name], MD as Consulting Physician (General Surgery) [name], MD as Consulting Physician (Hematology and Oncology)  Date of Service:  12/05/2022  CHIEF COMPLAINT: f/u of  metastatic colon cancer   CURRENT THERAPY:  FOLFIRI/Beva q14 days, starting 04/06/22. Beva stopped due to PE on restaging CT 10/2022   ASSESSMENT:  Kim Castro is a 76 y.o. female with   Malignant neoplasm of ascending colon (HCC) pT3N2aM0 stage IIB, MSS, metastatic to b/l ovaries and peritoneum and liver, KRAS mutation G12V (+)  -Initially diagnosed in 08/2019, s/p resection with Dr Maisie Fus on 11/01/19. Path showed overall Stage IIIB cancer.  -s/p 6 months adjuvant FOLFOX, completed 06/01/20, oxaliplatin held for final 2 cycles due to neuropathy.  -s/p BSO and hysterectomy on 12/22/21 with Dr. Pricilla Holm. Path revealed metastatic moderately differentiated colonic adenocarcinoma involving both ovaries and peritoneum -she restarted FOLFOX on 01/24/22. -Due to disease progression, chemotherapy changed to second line FOLFIRI and bevacizumab on April 06, 2022. She has been tolerating moderately well, better now with dose reduction  -Restaging CT scan from 09/27/2022 showed continous response in liver and peritoneal metastasis, no other new lesions.   -We discussed maintenance therapy option in future  -due to her PE in 10/2022, will hold beva for 3-6 months   Malignant neoplasm of female breast [facility]) -Dx in 05/2014, s/p left lumpectomy with Dr Johna Sheriff, adjuvant RT with Dr [name], and Anastrozole 08/2014 - 01/13/20 under Dr Pamelia Hoit.  -DEXA 09/11/20 T score +1.2 normal.  -most recent mammogram on 10/31/2022 was negative.      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  Overall stable.    Pulmonary embolism (HCC) -incidental findings of bilateral distal PEs on restaging CT 10/02/2022, pt was asymptomatic -doppler of LEs (-) DVT -continue Eliquis indefinitely     DNR (do not resuscitate) The patient understands the goal of care is palliative.  -pt has agreed with DNR, order placed.       PLAN: -lab reviewed - CMP-pending -proceed with Folfiri today -lab/flush and Folfiri 12/19/2022 -restaging CT in 3-4 weeks    SUMMARY OF ONCOLOGIC HISTORY: Oncology History Overview Note   Cancer Staging  Malignant neoplasm of female breast [facility]) Staging form: Breast, AJCC 7th Edition - Clinical stage from 06/11/2014: Stage 0 (Tis (DCIS), N0, M0) - Unsigned Staged by: Pathologist and managing physician Laterality: Left Estrogen receptor status: Positive Progesterone receptor status: Positive Stage used in treatment planning: Yes National guidelines used in treatment planning: Yes Type of national guideline used in treatment planning: NCCN - Pathologic stage from 07/03/2014: Stage Unknown (Tis (DCIS), NX, cM0) - Signed by [name], MD on 07/10/2014 Staged by: Pathologist Laterality: Left Estrogen receptor status: Positive Progesterone receptor status: Positive Stage used in treatment planning: Yes National guidelines used in treatment planning: Yes Type of national guideline used in treatment planning: NCCN Staging comments: Staged on final lumpectomy specimen by Dr. [name].  Right colon cancer Staging form: Colon and Rectum, AJCC 8th Edition - Pathologic stage from 11/01/2019: Stage IIIB (pT3, pN2a, cM0) - Signed by [name], MD on 11/29/2019 Stage prefix: Initial diagnosis Histologic grading system: 4 grade system Histologic grade (G): G2 Residual tumor (  R): R0 - None Tumor deposits (TD): Absent Perineural invasion  (PNI): Absent Microsatellite instability (MSI): Stable KRAS mutation: Unknown NRAS mutation: Unknown BRAF mutation: Unknown     Malignant neoplasm of female breast (HCC)  05/29/2014 Initial Biopsy   Left breast needle core biopsy: Grade 2, DCIS with calcs. ER+ (100%), PR+ (96%).    06/04/2014 Initial Diagnosis   Left breast DCIS with calcifications, ER 100%, PR 96%   06/10/2014 Breast MRI   Left breast: 2.4 x 1.3 x 1.1 cm area of patchy non-mass enhancement upper outer quadrant includes postbiopsy seroma; Right breast: 1.2 cm previously biopsied stable benign fibroadenoma   06/12/2014 Procedure   Genetic counseling/testing: Identified 1 VUS on CHEK2 gene. Remainder of 17 gene panel tested negative and included: ATM, BARD1, BRCA 1/2, BRIP1, CDH1, CHEK2, EPCAM, MLH1, MSH2, MSH6, NBN, NF1, PALB2, PTEN, RAD50, RAD51C, RAD51D, STK11, and TP53.    07/01/2014 Surgery   Left breast lumpectomy ([facility]): Grade 1, DCIS, spanning 2.3 cm, 1 mm margin, ER 100%, PR 96%   07/31/2014 - 08/28/2014 Radiation Therapy   Adjuvant RT completed [name]). Left breast: Total dose 42.5 Gy over 17 fractions. Left breast boost: Total dose 7.5 Gy over 3 fractions.    09/14/2014 - 01/13/2020 Anti-estrogen oral therapy   Anastrazole 1mg  daily. Planned duration of treatment: 5 years [place]). Completed in 01/2020.    09/25/2014 Survivorship   Survivorship Care Plan given to patient and reviewed with her in person.    03/02/2021 Imaging   CT CAP  IMPRESSION: 1. No findings of active/recurrent malignancy. Partial right hemicolectomy. 2. Endometrial stripe remains mildly thickened, but endometrial biopsy in May was negative for malignancy. 3. Progressive endplate sclerosis and endplate irregularity at T2-3, probably due to degenerative endplate findings. If the has referable upper thoracic pain/symptoms then thoracic spine MRI could be used for further workup. 4. Other imaging findings of potential clinical  significance: Mild cardiomegaly. Aortic Atherosclerosis (ICD10-I70.0). Mild mitral valve calcification. Postoperative findings in the left breast with adjacent radiation port anteriorly in the left upper lobe. Tiny pulmonary nodules in the left lower lobe are unchanged from earliest available comparison of 10/17/2019 and probably benign although may merit surveillance. Multilevel lumbar impingement. Mild pelvic floor laxity.   Malignant neoplasm of ascending colon (HCC)  09/19/2019 Imaging   CT AP W contrast 09/19/19  IMPRESSION Fullness in the cecum, cannot exclude a mass. No evidence for metastatic disease is identified.    09/24/2019 Procedure   Colonoscopy by Dr [name] 09/24/19 IMPRESSION 1. The colon was redundant  2. Mild diverticulosis was noted through the entire examined colon 3. Single 12mm polyp was found in the ascending colon; polypectomy was performed using snare cautery and biopsy forceps 4. Mild diverticulosis was notes in the descending colon and sigmoid colon.  5. Single polyp was found in the sigmoid colon, polypectomy was performed with cold forceps.  6. Single polyp was found in the rectosigmoid colon; polypectomy was performed with cold snare  7. Small internal hemorrhoids  8. Large mass was found at the cecum; multiple biopsies of the area were performed using cold forceps; injection (tattooing) was performed distal to the mass.    09/24/2019 Initial Biopsy   INTERPRETATION AND DIAGNOSIS:  A. Cecum, biopsy:  Invasive moderately differentiated adenocarcinoma.  see comment  B. Polyp @ ascending colon, polypectomy:  Tubular Adenoma  C. Polyp @ sigmoid colon Polypectomy:  hyperplastic polyp.  D. Polyp @ rectosigmoid colon, Polypectomy:  Hyperplastic Polyp      10/16/2019  Imaging   CT Chest IMPRESSION: 1. Multiple small pulmonary nodules measuring 5 mm or less in size in the lungs. These are nonspecific and are typically considered statistically likely benign.  However, given the patient's history of primary malignancy, close attention on follow-up studies is recommended to ensure stability. 2. Aortic atherosclerosis, in addition to right coronary artery disease. Assessment for potential risk factor modification, dietary therapy or pharmacologic therapy may be warranted, if clinically indicated. 3. There are calcifications of the aortic valve and mitral annulus. Echocardiographic correlation for evaluation of potential valvular dysfunction may be warranted if clinically indicated. 4. Small hiatal hernia.   Aortic Atherosclerosis (ICD10-I70.0).   11/01/2019 Initial Diagnosis   Colon cancer (HCC)   11/01/2019 Surgery   LAPAROSCOPIC PARTIAL COLECTOMY by Dr Maisie Fus and Dr Michaell Cowing   11/01/2019 Pathology Results   FINAL MICROSCOPIC DIAGNOSIS:   A. COLON, PROXIMAL RIGHT, COLECTOMY:  - Invasive colonic adenocarcinoma, 5 cm.  - Tumor invades through the muscularis propria into pericolonic tissues.   - Margins of resection are not involved.  - Metastatic carcinoma in (5) of (13) lymph nodes.  - See oncology table.    MSI Stable  Mismatch repair normal  MLH1 - Preserved nuclear expression (greater 50% tumor expression) MSH2 - Preserved nuclear expression (greater 50% tumor expression) MSH6 - Preserved nuclear expression (greater 50% tumor expression) PMS2 - Preserved nuclear expression (greater 50% tumor expression)   11/01/2019 Cancer Staging   Staging form: Colon and Rectum, AJCC 8th Edition - Pathologic stage from 11/01/2019: Stage IIIB (pT3, pN2a, cM0) - Signed by [name], MD on 11/29/2019   12/10/2019 Procedure   PAC placed 12/10/19   12/17/2019 - 06/01/2020 Chemotherapy   FOLFOX q2weeks starting in 2 weeks starting 12/17/19. Held 01/27/20-02/10/20 due to b/l PE. Oxaliplatin held C11-12 due to neuropathy. Completed on 06/01/20   03/02/2021 Imaging   CT CAP  IMPRESSION: 1. No findings of active/recurrent malignancy. Partial  right hemicolectomy. 2. Endometrial stripe remains mildly thickened, but endometrial biopsy in May was negative for malignancy. 3. Progressive endplate sclerosis and endplate irregularity at T2-3, probably due to degenerative endplate findings. If the has referable upper thoracic pain/symptoms then thoracic spine MRI could be used for further workup. 4. Other imaging findings of potential clinical significance: Mild cardiomegaly. Aortic Atherosclerosis (ICD10-I70.0). Mild mitral valve calcification. Postoperative findings in the left breast with adjacent radiation port anteriorly in the left upper lobe. Tiny pulmonary nodules in the left lower lobe are unchanged from earliest available comparison of 10/17/2019 and probably benign although may merit surveillance. Multilevel lumbar impingement. Mild pelvic floor laxity.   08/26/2021 Imaging   EXAM: CT CHEST, ABDOMEN, AND PELVIS WITH CONTRAST  IMPRESSION: 1. Stable examination without new or progressive findings to suggest local recurrence or metastatic disease within the chest, abdomen, or pelvis. 2. Hepatomegaly with hepatic steatosis. 3. Sigmoid colonic diverticulosis without findings of acute diverticulitis. 4. Similar prominent endplate sclerosis and irregularity at T2-T3 is most consistent with Modic type endplate degenerative changes. However, if patient has referable upper thoracic pain consider further workup with thoracic spine MRI. 5. Similar thickening of the endometrial stripe measuring up to 8 mm, which was previously biopsied with results negative for malignancy. 6. Aortic Atherosclerosis (ICD10-I70.0).   11/10/2021 PET scan   IMPRESSION: 1. LEFT ovary is increased in size and is intensely hypermetabolic. While physiologic hypermetabolic ovarian tissue is not uncommon, the enlargement and asymmetric activity warrants further evaluation. Consider contrast pelvic MRI vs tissue sampling. 2. No evidence of metastatic  colorectal carcinoma otherwise. 3. Post RIGHT hemicolectomy anatomy. 4. Evidence of radiation change in the LEFT upper lobe (remote breast cancer).     12/22/2021 Relapse/Recurrence    FINAL MICROSCOPIC DIAGNOSIS:   A. LEFT OVARY AND FALLOPIAN TUBE, SALPINGO OOPHORECTOMY:  - Metastatic moderately differentiated colonic adenocarcinoma involving left ovary  - Focal ovarian stromal calcification  - Segment of benign fallopian tube   B. UTERUS WITH RIGHT FALLOPIAN TUBE AND OVARY, HYSTERECTOMY AND SALPINGO-OOPHORECTOMY:  - Metastatic moderately differentiated colonic adenocarcinoma involving right ovary  - Focal invasive extensive adenomyosis  - Benign endometrial polyps  - Benign proliferative phase endometrium  - Hydrosalpinx of right fallopian tube  - Focal ovarian stromal calcification   C. PERITONEAL DEPOSITS, ANTERIOR CUL DE SAC, BIOPSY:  - Metastatic mucinous adenocarcinoma, consistent with colorectal primary    COMMENT:  Immunohistochemical stains show that the tumor cells are positive for CK20 and CDX2 while they are negative for CK7 and PAX8, consistent with above interpretation.    01/24/2022 - 03/23/2022 Chemotherapy   Patient is on Treatment Plan : COLORECTAL FOLFOX q14d x 3 months     04/01/2022 Imaging   CT AP IMPRESSION: 1. New omental metastatic disease. 2. Vague hypoattenuating lesion in the inferior right hepatic lobe is new and also worrisome for metastatic disease. 3. Similar small right lower lobe nodules. Recommend continued attention on follow-up. 4. Steatotic enlarged liver. 5.  Aortic atherosclerosis (ICD10-I70.0).   04/06/2022 -  Chemotherapy   Patient is on Treatment Plan : COLORECTAL FOLFIRI + Bevacizumab q14d     04/21/2022 Imaging    IMPRESSION: 1. Stable chest CT. No evidence of metastatic disease. 2. Stable small pulmonary nodules, considered benign based on stability. 3. Stable postsurgical changes in the left breast and anterior left upper  lobe radiation changes. 4. Aortic Atherosclerosis (ICD10-I70.0) and Emphysema (ICD10-J43.9).   04/21/2022 Imaging    IMPRESSION: 1. Small enhancing lesion inferiorly in the right hepatic lobe is typical of metastatic disease and stable from recent abdominal CT. No other definite liver lesions are identified. Mild hepatic steatosis. 2. No other significant abdominal findings. 3. Omental nodularity seen on CT is not well visualized by MRI.    Imaging     06/23/2022 Imaging    IMPRESSION: 1. Hypodense lesion of the inferior right lobe of the liver, hepatic segment VI is diminished in size, consistent with treatment response. 2. Tiny peritoneal and omental nodules identified by prior examination are diminished in size, consistent with treatment response. 3. Multiple tiny pulmonary nodules unchanged, most likely benign and incidental, however continued attention on follow-up warranted in the setting of known metastatic disease. 4. No evidence of new metastatic disease in the chest, abdomen, or pelvis. 5. Status post partial right hemicolectomy and reanastomosis. 6. Diffuse mosaic attenuation of the airspaces, consistent with small airways disease. 7. Hepatomegaly.      INTERVAL HISTORY:  Kim Castro is here for a follow up of  metastatic colon cancer. She was last seen by me on 11/21/2022. She presents to the clinic alone. Pt state that she still has some fatigue,and its about the same . Pt denies having any BM issues. Pt report her appetite is good as well.      All other systems were reviewed with the patient and are negative.  MEDICAL HISTORY:  Past Medical History:  Diagnosis Date   Aortic atherosclerosis (HCC)    Arthritis    feet, lower back   Basal cell carcinoma    arm  Breast cancer of upper-outer quadrant of left female breast (HCC) 06/04/2014   Cataract    immature on the left   Colon cancer (HCC) 08/2019   Diabetes mellitus without complication  (HCC)    Diverticulosis    Dizziness    > 47yrs ago;took Antivert    Family history of anesthesia complication    sister slow to wake up with anesthesia   Family history of breast cancer    Family history of colon cancer    Family history of uterine cancer    GERD (gastroesophageal reflux disease)    takes occasional TUMs   History of bronchitis    > 90yrs ago   History of colon polyps    History of hiatal hernia    Small noted on CT   History of pulmonary embolus (PE)    Hypertension    takes Losartan daily and HCTZ   Iron deficiency anemia    Joint pain    Numbness    to toes on each foot   Peripheral neuropathy    feet and toes   Personal history of radiation therapy    Pulmonary nodules    Noted on CT   Radiation 07/31/14-08/28/14   Left Breast 20 fxs   Seasonal allergies    takes Claritin prn   Urinary frequency    Vitamin D deficiency    takes VIt D daily    SURGICAL HISTORY: Past Surgical History:  Procedure Laterality Date   BREAST BIOPSY Bilateral    BREAST LUMPECTOMY Left    BREAST LUMPECTOMY WITH RADIOACTIVE SEED LOCALIZATION Left 07/01/2014   Procedure: LEFT BREAST LUMPECTOMY WITH RADIOACTIVE SEED LOCALIZATION;  Surgeon: Glenna Fellows, MD;  Location: [facility];  Service: General;  Laterality: Left;   CATARACT EXTRACTION Right    COLONOSCOPY  09/24/2019   [place]   LAPAROSCOPIC PARTIAL COLECTOMY N/A 11/01/2019   Procedure: LAPAROSCOPIC PARTIAL COLECTOMY;  Surgeon: [name], MD;  Location: [facility];  Service: General;  Laterality: N/A;   POLYPECTOMY     PORTACATH PLACEMENT N/A 12/10/2019   Procedure: INSERTION PORT-A-CATH ULTRASOUND GUIDED IN RIGHT IJ;  Surgeon: [name], MD;  Location: [facility];  Service: General;  Laterality: N/A;   ROBOTIC ASSISTED TOTAL HYSTERECTOMY Bilateral 12/22/2021   Procedure: XI ROBOTIC ASSISTED TOTAL HYSTERECTOMY WITH BILATERAL SALPINGO OOPHORECTOMY;  Surgeon: Carver Fila, MD;  Location: [facility];  Service: Gynecology;  Laterality: Bilateral;   TOTAL KNEE ARTHROPLASTY Left 10/24/2012   Procedure: TOTAL KNEE ARTHROPLASTY;  Surgeon: Nestor Lewandowsky, MD;  Location: [facility];  Service: Orthopedics;  Laterality: Left;   TOTAL KNEE ARTHROPLASTY Right 01/09/2013   Procedure: TOTAL KNEE ARTHROPLASTY;  Surgeon: Nestor Lewandowsky, MD;  Location: [facility];  Service: Orthopedics;  Laterality: Right;   TUBAL LIGATION      I have reviewed the social history and family history with the patient and they are unchanged from previous note.  ALLERGIES:  is allergic to oxycodone.  MEDICATIONS:  Current Outpatient Medications  Medication Sig Dispense Refill   apixaban (ELIQUIS) 5 MG TABS tablet Take 1 tablet (5 mg total) by mouth 2 (two) times daily. 60 tablet 2   b complex vitamins capsule Take 1 capsule by mouth daily.     Cholecalciferol (VITAMIN D) 2000 UNITS CAPS Take 2,000 Units by mouth daily.      ibuprofen (ADVIL) 200 MG tablet Take 200 mg by mouth daily as needed (pain).     lidocaine-prilocaine (EMLA) cream Apply 1  Application topically as needed. 30 g 0   loratadine (CLARITIN) 10 MG tablet Take 10 mg by mouth daily as needed for allergies.     losartan (COZAAR) 25 MG tablet Take 1 tablet (25 mg total) by mouth daily. 90 tablet 3   metFORMIN (GLUCOPHAGE) 500 MG tablet Take 1 tablet (500 mg total) by mouth in the morning and at bedtime. 180 tablet 3   Multiple Vitamins-Minerals (MULTIVITAMIN WITH MINERALS) tablet Take 1 tablet by mouth daily.     omeprazole (PRILOSEC) 20 MG capsule Take 20 mg by mouth daily before breakfast.      ondansetron (ZOFRAN) 8 MG tablet Take 1 tablet (8 mg total) by mouth every 8 (eight) hours as needed for nausea or vomiting. Start on the third day after chemotherapy. 30 tablet 1   potassium chloride (KLOR-CON) 10 MEQ tablet Take 1 tablet (10 mEq total) by mouth daily. 30 tablet 0   pravastatin (PRAVACHOL) 10 MG tablet Take 1 tablet (10 mg total) by mouth daily. 90 tablet 3    pregabalin (LYRICA) 25 MG capsule Take 1 capsule (25 mg total) by mouth every morning. Take in addition to 50 mg at bedtime 60 capsule 2   pregabalin (LYRICA) 50 MG capsule Take 1 capsule (50 mg total) by mouth at bedtime. Take in addition to 25 mg in the morning 60 capsule 2   prochlorperazine (COMPAZINE) 10 MG tablet Take 1 tablet (10 mg total) by mouth every 6 (six) hours as needed for nausea or vomiting. 30 tablet 1   triamcinolone ointment (KENALOG) 0.5 % Apply 1 Application topically 2 (two) times daily. 30 g 0   No current facility-administered medications for this visit.   Facility-Administered Medications Ordered in Other Visits  Medication Dose Route Frequency Provider Last Rate Last Admin   0.9 %  sodium chloride infusion   Intravenous Once [name], MD       0.9 %  sodium chloride infusion   Intravenous Once [name], MD       fluorouracil (ADRUCIL) 5,000 mg in sodium chloride 0.9 % 150 mL chemo infusion  2,400 mg/m2 (Treatment Plan Recorded) Intravenous 1 day or 1 dose [name], MD       irinotecan (CAMPTOSAR) 220 mg in sodium chloride 0.9 % 500 mL chemo infusion  100 mg/m2 (Treatment Plan Recorded) Intravenous Once [name], MD       leucovorin 896 mg in sodium chloride 0.9 % 250 mL infusion  400 mg/m2 (Treatment Plan Recorded) Intravenous Once [name], MD        PHYSICAL EXAMINATION: ECOG PERFORMANCE STATUS: 1 - Symptomatic but completely ambulatory  Vitals:   12/05/22 0836  BP: (!) 142/58  Pulse: 61  Resp: 15  Temp: 98.3 F (36.8 C)  SpO2: 99%   Wt Readings from Last 3 Encounters:  12/05/22 216 lb 9.6 oz (98.2 kg)  11/21/22 215 lb 14.4 oz (97.9 kg)  11/15/22 208 lb 6.4 oz (94.5 kg)     GENERAL:alert, no distress and comfortable SKIN: skin color normal, no rashes or significant lesions EYES: normal, Conjunctiva are pink and non-injected, sclera clear  NEURO: alert & oriented x 3 with fluent speech LABORATORY DATA:  I have reviewed the data as listed     Latest Ref Rng & Units 12/05/2022    7:48 AM 11/21/2022    7:39 AM 11/07/2022    8:13 AM  CBC  WBC 4.0 - 10.5 K/uL 16.1  14.8  13.9   Hemoglobin 12.0 -  15.0 g/dL 84.6  96.2  95.2   Hematocrit 36.0 - 46.0 % 31.9  32.3  34.0   Platelets 150 - 400 K/uL 202  209  243         Latest Ref Rng & Units 12/05/2022    7:48 AM 11/21/2022    7:39 AM 11/15/2022   11:07 AM  CMP  Glucose 70 - 99 mg/dL 841  324  401   BUN 8 - 23 mg/dL 16  13    Creatinine 0.27 - 1.00 mg/dL 2.53  6.64    Sodium 403 - 145 mmol/L 143  142    Potassium 3.5 - 5.1 mmol/L 3.9  4.5    Chloride 98 - 111 mmol/L 111  109    CO2 22 - 32 mmol/L 25  26    Calcium 8.9 - 10.3 mg/dL 8.4  9.2    Total Protein 6.5 - 8.1 g/dL 5.9  5.9    Total Bilirubin 0.3 - 1.2 mg/dL 0.6  0.7    Alkaline Phos 38 - 126 U/L 135  131    AST 15 - 41 U/L 24  25    ALT 0 - 44 U/L 16  17        RADIOGRAPHIC STUDIES: I have personally reviewed the radiological images as listed and agreed with the findings in the report. No results found.    Orders Placed This Encounter  Procedures   CT CHEST ABDOMEN PELVIS W CONTRAST    Standing Status:   Future    Standing Expiration Date:   12/05/2023    Order Specific Question:   If indicated for the ordered procedure, I authorize the administration of contrast media per Radiology protocol    Answer:   Yes    Order Specific Question:   Does the patient have a contrast media/X-ray dye allergy?    Answer:   No    Order Specific Question:   Preferred imaging location?    Answer:   [facility]    Order Specific Question:   Release to patient    Answer:   Immediate    Order Specific Question:   If indicated for the ordered procedure, I authorize the administration of oral contrast media per Radiology protocol    Answer:   Yes   All questions were answered. The patient knows to call the clinic with any problems, questions or concerns. No barriers to learning was detected. The total time spent in the  appointment was 30 minutes.     [name], MD 12/05/2022   Carolin Coy, CMA, am acting as scribe for [name], MD.   I have reviewed the above documentation for accuracy and completeness, and I agree with the above.

## 2022-12-05 NOTE — Progress Notes (Signed)
Nutrition Follow-up:  Patient with right colon cancer.  Patient receiving folfiri and bevacizumab.    Met with patient during infusion.  Reports that her appetite is good.  Grand-daughter came up this weekend and ate with her at [employer] and [employer].  Reports that appetite decreases for few days after treatment then picks back up.  Denies nausea.      Medications: reviewed  Labs: reviewed  Anthropometrics:   Weight 216 lb 9.6 oz on 8/5 211 lb 12.8 oz on 6/10 213 lb on 4/22 221 lb on 2/26 248 lb on 10/11  NUTRITION DIAGNOSIS: Inadequate oral intake stable   INTERVENTION:  Encouraged well balanced diet including good sources of protein    MONITORING, EVALUATION, GOAL: weight trends, intake   NEXT VISIT: as needed   B. Freida Busman, RD, LDN Registered Dietitian [phone]

## 2022-12-07 ENCOUNTER — Inpatient Hospital Stay: Payer: Medicare Other

## 2022-12-07 ENCOUNTER — Other Ambulatory Visit: Payer: Self-pay

## 2022-12-07 VITALS — BP 131/50 | HR 63 | Temp 98.7°F | Resp 18

## 2022-12-07 DIAGNOSIS — C786 Secondary malignant neoplasm of retroperitoneum and peritoneum: Secondary | ICD-10-CM | POA: Diagnosis not present

## 2022-12-07 DIAGNOSIS — G620 Drug-induced polyneuropathy: Secondary | ICD-10-CM | POA: Diagnosis not present

## 2022-12-07 DIAGNOSIS — C182 Malignant neoplasm of ascending colon: Secondary | ICD-10-CM | POA: Diagnosis not present

## 2022-12-07 DIAGNOSIS — C787 Secondary malignant neoplasm of liver and intrahepatic bile duct: Secondary | ICD-10-CM | POA: Diagnosis not present

## 2022-12-07 DIAGNOSIS — Z7901 Long term (current) use of anticoagulants: Secondary | ICD-10-CM | POA: Diagnosis not present

## 2022-12-07 DIAGNOSIS — Z90722 Acquired absence of ovaries, bilateral: Secondary | ICD-10-CM | POA: Diagnosis not present

## 2022-12-07 DIAGNOSIS — Z90710 Acquired absence of both cervix and uterus: Secondary | ICD-10-CM | POA: Diagnosis not present

## 2022-12-07 DIAGNOSIS — Z5111 Encounter for antineoplastic chemotherapy: Secondary | ICD-10-CM | POA: Diagnosis not present

## 2022-12-07 DIAGNOSIS — C563 Malignant neoplasm of bilateral ovaries: Secondary | ICD-10-CM | POA: Diagnosis not present

## 2022-12-07 DIAGNOSIS — Z5189 Encounter for other specified aftercare: Secondary | ICD-10-CM | POA: Diagnosis not present

## 2022-12-07 DIAGNOSIS — Z66 Do not resuscitate: Secondary | ICD-10-CM | POA: Diagnosis not present

## 2022-12-07 DIAGNOSIS — Z853 Personal history of malignant neoplasm of breast: Secondary | ICD-10-CM | POA: Diagnosis not present

## 2022-12-07 DIAGNOSIS — I2699 Other pulmonary embolism without acute cor pulmonale: Secondary | ICD-10-CM | POA: Diagnosis not present

## 2022-12-07 MED ORDER — HEPARIN SOD (PORK) LOCK FLUSH 100 UNIT/ML IV SOLN
500.0000 [IU] | Freq: Once | INTRAVENOUS | Status: AC | PRN
  Administered 2022-12-07: 500 [IU]

## 2022-12-07 MED ORDER — PEGFILGRASTIM-CBQV 6 MG/0.6ML ~~LOC~~ SOSY
6.0000 mg | PREFILLED_SYRINGE | Freq: Once | SUBCUTANEOUS | Status: AC
  Administered 2022-12-07: 6 mg via SUBCUTANEOUS
  Filled 2022-12-07: qty 0.6

## 2022-12-07 MED ORDER — SODIUM CHLORIDE 0.9% FLUSH
10.0000 mL | INTRAVENOUS | Status: DC | PRN
  Administered 2022-12-07: 10 mL

## 2022-12-16 MED FILL — Dexamethasone Sodium Phosphate Inj 100 MG/10ML: INTRAMUSCULAR | Qty: 1 | Status: AC

## 2022-12-16 NOTE — Progress Notes (Unsigned)
[facility]   [phone]) [phone] [phone]   Clinic Follow up Note   Patient Care Team: [name], MD as PCP - General (Family Medicine) [name], MD as Consulting Physician (General Surgery) [name], MD as Consulting Physician (Hematology) [name], MD as Consulting Physician (General Surgery) [name], MD as Consulting Physician (Hematology and Oncology)  Date of Service:  12/19/2022  CHIEF COMPLAINT: f/u of metastatic colon cancer   CURRENT THERAPY:  FOLFIRI/Beva q14 days, starting 04/06/22. Beva stopped due to PE on restaging CT 10/2022     ASSESSMENT:  Kim Castro is a 77 y.o. female with   Malignant neoplasm of ascending colon (HCC) pT3N2aM0 stage IIB, MSS, metastatic to b/l ovaries and peritoneum and liver, KRAS mutation G12V (+)  -Initially diagnosed in 08/2019, s/p resection with Dr Maisie Fus on 11/01/19. Path showed overall Stage IIIB cancer.  -s/p 6 months adjuvant FOLFOX, completed 06/01/20, oxaliplatin held for final 2 cycles due to neuropathy.  -s/p BSO and hysterectomy on 12/22/21 with Dr. Pricilla Holm. Path revealed metastatic moderately differentiated colonic adenocarcinoma involving both ovaries and peritoneum -she restarted FOLFOX on 01/24/22. -Due to disease progression, chemotherapy changed to second line FOLFIRI and bevacizumab on April 06, 2022. She has been tolerating moderately well, better now with dose reduction, will continue -Restaging CT scan from 09/27/2022 showed continous response in liver and peritoneal metastasis, no other new lesions.   -We discussed maintenance therapy option in future  -due to her PE in 10/2022, will hold beva for 3-6 months  -restaging CT scheduled for 8/31  Malignant neoplasm of female breast [facility]) -Dx in 05/2014, s/p left lumpectomy with Dr Johna Sheriff, adjuvant RT with Dr [name], and Anastrozole 08/2014 - 01/13/20 under Dr Pamelia Hoit.  -DEXA 09/11/20 T score +1.2 normal.  -most  recent mammogram on 10/31/2022 was negative.     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  Overall stable.    DNR (do not resuscitate) The patient understands the goal of care is palliative.  -pt has agreed with DNR, order placed.     Pulmonary embolism (HCC) -incidental findings of bilateral distal PEs on restaging CT 10/02/2022, pt was asymptomatic -doppler of LEs (-) DVT -continue Eliquis indefinitely        PLAN: -lab-pending -CMP-pending - I refill Gabapentin -CT schedule 8/30 -proceed with treatment today   SUMMARY OF ONCOLOGIC HISTORY: Oncology History Overview Note   Cancer Staging  Malignant neoplasm of female breast [facility]) Staging form: Breast, AJCC 7th Edition - Clinical stage from 06/11/2014: Stage 0 (Tis (DCIS), N0, M0) - Unsigned Staged by: Pathologist and managing physician Laterality: Left Estrogen receptor status: Positive Progesterone receptor status: Positive Stage used in treatment planning: Yes National guidelines used in treatment planning: Yes Type of national guideline used in treatment planning: NCCN - Pathologic stage from 07/03/2014: Stage Unknown (Tis (DCIS), NX, cM0) - Signed by [name], MD on 07/10/2014 Staged by: Pathologist Laterality: Left Estrogen receptor status: Positive Progesterone receptor status: Positive Stage used in treatment planning: Yes National guidelines used in treatment planning: Yes Type of national guideline used in treatment planning: NCCN Staging comments: Staged on final lumpectomy specimen by Dr. [name].  Right colon cancer Staging form: Colon and Rectum, AJCC 8th Edition - Pathologic stage from 11/01/2019: Stage IIIB (pT3, pN2a, cM0) - Signed by [name], MD on 11/29/2019 Stage prefix: Initial diagnosis Histologic grading system: 4 grade system Histologic grade (G):  G2 Residual tumor (R): R0 - None Tumor deposits (TD):  Absent Perineural invasion (PNI): Absent Microsatellite instability (MSI): Stable KRAS mutation: Unknown NRAS mutation: Unknown BRAF mutation: Unknown     Malignant neoplasm of female breast (HCC)  05/29/2014 Initial Biopsy   Left breast needle core biopsy: Grade 2, DCIS with calcs. ER+ (100%), PR+ (96%).    06/04/2014 Initial Diagnosis   Left breast DCIS with calcifications, ER 100%, PR 96%   06/10/2014 Breast MRI   Left breast: 2.4 x 1.3 x 1.1 cm area of patchy non-mass enhancement upper outer quadrant includes postbiopsy seroma; Right breast: 1.2 cm previously biopsied stable benign fibroadenoma   06/12/2014 Procedure   Genetic counseling/testing: Identified 1 VUS on CHEK2 gene. Remainder of 17 gene panel tested negative and included: ATM, BARD1, BRCA 1/2, BRIP1, CDH1, CHEK2, EPCAM, MLH1, MSH2, MSH6, NBN, NF1, PALB2, PTEN, RAD50, RAD51C, RAD51D, STK11, and TP53.    07/01/2014 Surgery   Left breast lumpectomy ([facility]): Grade 1, DCIS, spanning 2.3 cm, 1 mm margin, ER 100%, PR 96%   07/31/2014 - 08/28/2014 Radiation Therapy   Adjuvant RT completed [name]). Left breast: Total dose 42.5 Gy over 17 fractions. Left breast boost: Total dose 7.5 Gy over 3 fractions.    09/14/2014 - 01/13/2020 Anti-estrogen oral therapy   Anastrazole 1mg  daily. Planned duration of treatment: 5 years [place]). Completed in 01/2020.    09/25/2014 Survivorship   Survivorship Care Plan given to patient and reviewed with her in person.    03/02/2021 Imaging   CT CAP  IMPRESSION: 1. No findings of active/recurrent malignancy. Partial right hemicolectomy. 2. Endometrial stripe remains mildly thickened, but endometrial biopsy in May was negative for malignancy. 3. Progressive endplate sclerosis and endplate irregularity at T2-3, probably due to degenerative endplate findings. If the has referable upper thoracic pain/symptoms then thoracic spine MRI could be used for further workup. 4. Other imaging findings of  potential clinical significance: Mild cardiomegaly. Aortic Atherosclerosis (ICD10-I70.0). Mild mitral valve calcification. Postoperative findings in the left breast with adjacent radiation port anteriorly in the left upper lobe. Tiny pulmonary nodules in the left lower lobe are unchanged from earliest available comparison of 10/17/2019 and probably benign although may merit surveillance. Multilevel lumbar impingement. Mild pelvic floor laxity.   Malignant neoplasm of ascending colon (HCC)  09/19/2019 Imaging   CT AP W contrast 09/19/19  IMPRESSION Fullness in the cecum, cannot exclude a mass. No evidence for metastatic disease is identified.    09/24/2019 Procedure   Colonoscopy by Dr [name] 09/24/19 IMPRESSION 1. The colon was redundant  2. Mild diverticulosis was noted through the entire examined colon 3. Single 12mm polyp was found in the ascending colon; polypectomy was performed using snare cautery and biopsy forceps 4. Mild diverticulosis was notes in the descending colon and sigmoid colon.  5. Single polyp was found in the sigmoid colon, polypectomy was performed with cold forceps.  6. Single polyp was found in the rectosigmoid colon; polypectomy was performed with cold snare  7. Small internal hemorrhoids  8. Large mass was found at the cecum; multiple biopsies of the area were performed using cold forceps; injection (tattooing) was performed distal to the mass.    09/24/2019 Initial Biopsy   INTERPRETATION AND DIAGNOSIS:  A. Cecum, biopsy:  Invasive moderately differentiated adenocarcinoma.  see comment  B. Polyp @ ascending colon, polypectomy:  Tubular Adenoma  C. Polyp @ sigmoid colon Polypectomy:  hyperplastic polyp.  D. Polyp @ rectosigmoid colon, Polypectomy:  Hyperplastic Polyp  10/16/2019 Imaging   CT Chest IMPRESSION: 1. Multiple small pulmonary nodules measuring 5 mm or less in size in the lungs. These are nonspecific and are typically considered statistically  likely benign. However, given the patient's history of primary malignancy, close attention on follow-up studies is recommended to ensure stability. 2. Aortic atherosclerosis, in addition to right coronary artery disease. Assessment for potential risk factor modification, dietary therapy or pharmacologic therapy may be warranted, if clinically indicated. 3. There are calcifications of the aortic valve and mitral annulus. Echocardiographic correlation for evaluation of potential valvular dysfunction may be warranted if clinically indicated. 4. Small hiatal hernia.   Aortic Atherosclerosis (ICD10-I70.0).   11/01/2019 Initial Diagnosis   Colon cancer (HCC)   11/01/2019 Surgery   LAPAROSCOPIC PARTIAL COLECTOMY by Dr Maisie Fus and Dr Michaell Cowing   11/01/2019 Pathology Results   FINAL MICROSCOPIC DIAGNOSIS:   A. COLON, PROXIMAL RIGHT, COLECTOMY:  - Invasive colonic adenocarcinoma, 5 cm.  - Tumor invades through the muscularis propria into pericolonic tissues.   - Margins of resection are not involved.  - Metastatic carcinoma in (5) of (13) lymph nodes.  - See oncology table.    MSI Stable  Mismatch repair normal  MLH1 - Preserved nuclear expression (greater 50% tumor expression) MSH2 - Preserved nuclear expression (greater 50% tumor expression) MSH6 - Preserved nuclear expression (greater 50% tumor expression) PMS2 - Preserved nuclear expression (greater 50% tumor expression)   11/01/2019 Cancer Staging   Staging form: Colon and Rectum, AJCC 8th Edition - Pathologic stage from 11/01/2019: Stage IIIB (pT3, pN2a, cM0) - Signed by [name], MD on 11/29/2019   12/10/2019 Procedure   PAC placed 12/10/19   12/17/2019 - 06/01/2020 Chemotherapy   FOLFOX q2weeks starting in 2 weeks starting 12/17/19. Held 01/27/20-02/10/20 due to b/l PE. Oxaliplatin held C11-12 due to neuropathy. Completed on 06/01/20   03/02/2021 Imaging   CT CAP  IMPRESSION: 1. No findings of active/recurrent malignancy. Partial  right hemicolectomy. 2. Endometrial stripe remains mildly thickened, but endometrial biopsy in May was negative for malignancy. 3. Progressive endplate sclerosis and endplate irregularity at T2-3, probably due to degenerative endplate findings. If the has referable upper thoracic pain/symptoms then thoracic spine MRI could be used for further workup. 4. Other imaging findings of potential clinical significance: Mild cardiomegaly. Aortic Atherosclerosis (ICD10-I70.0). Mild mitral valve calcification. Postoperative findings in the left breast with adjacent radiation port anteriorly in the left upper lobe. Tiny pulmonary nodules in the left lower lobe are unchanged from earliest available comparison of 10/17/2019 and probably benign although may merit surveillance. Multilevel lumbar impingement. Mild pelvic floor laxity.   08/26/2021 Imaging   EXAM: CT CHEST, ABDOMEN, AND PELVIS WITH CONTRAST  IMPRESSION: 1. Stable examination without new or progressive findings to suggest local recurrence or metastatic disease within the chest, abdomen, or pelvis. 2. Hepatomegaly with hepatic steatosis. 3. Sigmoid colonic diverticulosis without findings of acute diverticulitis. 4. Similar prominent endplate sclerosis and irregularity at T2-T3 is most consistent with Modic type endplate degenerative changes. However, if patient has referable upper thoracic pain consider further workup with thoracic spine MRI. 5. Similar thickening of the endometrial stripe measuring up to 8 mm, which was previously biopsied with results negative for malignancy. 6. Aortic Atherosclerosis (ICD10-I70.0).   11/10/2021 PET scan   IMPRESSION: 1. LEFT ovary is increased in size and is intensely hypermetabolic. While physiologic hypermetabolic ovarian tissue is not uncommon, the enlargement and asymmetric activity warrants further evaluation. Consider contrast pelvic MRI vs tissue sampling. 2. No evidence of  metastatic  colorectal carcinoma otherwise. 3. Post RIGHT hemicolectomy anatomy. 4. Evidence of radiation change in the LEFT upper lobe (remote breast cancer).     12/22/2021 Relapse/Recurrence    FINAL MICROSCOPIC DIAGNOSIS:   A. LEFT OVARY AND FALLOPIAN TUBE, SALPINGO OOPHORECTOMY:  - Metastatic moderately differentiated colonic adenocarcinoma involving left ovary  - Focal ovarian stromal calcification  - Segment of benign fallopian tube   B. UTERUS WITH RIGHT FALLOPIAN TUBE AND OVARY, HYSTERECTOMY AND SALPINGO-OOPHORECTOMY:  - Metastatic moderately differentiated colonic adenocarcinoma involving right ovary  - Focal invasive extensive adenomyosis  - Benign endometrial polyps  - Benign proliferative phase endometrium  - Hydrosalpinx of right fallopian tube  - Focal ovarian stromal calcification   C. PERITONEAL DEPOSITS, ANTERIOR CUL DE SAC, BIOPSY:  - Metastatic mucinous adenocarcinoma, consistent with colorectal primary    COMMENT:  Immunohistochemical stains show that the tumor cells are positive for CK20 and CDX2 while they are negative for CK7 and PAX8, consistent with above interpretation.    01/24/2022 - 03/23/2022 Chemotherapy   Patient is on Treatment Plan : COLORECTAL FOLFOX q14d x 3 months     04/01/2022 Imaging   CT AP IMPRESSION: 1. New omental metastatic disease. 2. Vague hypoattenuating lesion in the inferior right hepatic lobe is new and also worrisome for metastatic disease. 3. Similar small right lower lobe nodules. Recommend continued attention on follow-up. 4. Steatotic enlarged liver. 5.  Aortic atherosclerosis (ICD10-I70.0).   04/06/2022 -  Chemotherapy   Patient is on Treatment Plan : COLORECTAL FOLFIRI + Bevacizumab q14d     04/21/2022 Imaging    IMPRESSION: 1. Stable chest CT. No evidence of metastatic disease. 2. Stable small pulmonary nodules, considered benign based on stability. 3. Stable postsurgical changes in the left breast and anterior left upper  lobe radiation changes. 4. Aortic Atherosclerosis (ICD10-I70.0) and Emphysema (ICD10-J43.9).   04/21/2022 Imaging    IMPRESSION: 1. Small enhancing lesion inferiorly in the right hepatic lobe is typical of metastatic disease and stable from recent abdominal CT. No other definite liver lesions are identified. Mild hepatic steatosis. 2. No other significant abdominal findings. 3. Omental nodularity seen on CT is not well visualized by MRI.    Imaging     06/23/2022 Imaging    IMPRESSION: 1. Hypodense lesion of the inferior right lobe of the liver, hepatic segment VI is diminished in size, consistent with treatment response. 2. Tiny peritoneal and omental nodules identified by prior examination are diminished in size, consistent with treatment response. 3. Multiple tiny pulmonary nodules unchanged, most likely benign and incidental, however continued attention on follow-up warranted in the setting of known metastatic disease. 4. No evidence of new metastatic disease in the chest, abdomen, or pelvis. 5. Status post partial right hemicolectomy and reanastomosis. 6. Diffuse mosaic attenuation of the airspaces, consistent with small airways disease. 7. Hepatomegaly.      INTERVAL HISTORY:  Chasidee Welsh is here for a follow up of metastatic colon cancer . She was last seen by me on 12/05/2022. She presents to the clinic alone. Pt state that she has an ongoing sore throat, and that she takes ibuprofen for pain. Pt has no new issues from last treatment. Pt state that she has some tingling, but doesn't interfere with her ADL'S.       All other systems were reviewed with the patient and are negative.  MEDICAL HISTORY:  Past Medical History:  Diagnosis Date   Aortic atherosclerosis (HCC)    Arthritis    feet,  lower back   Basal cell carcinoma    arm   Breast cancer of upper-outer quadrant of left female breast (HCC) 06/04/2014   Cataract    immature on the left   Colon  cancer (HCC) 08/2019   Diabetes mellitus without complication (HCC)    Diverticulosis    Dizziness    > 38yrs ago;took Antivert    Family history of anesthesia complication    sister slow to wake up with anesthesia   Family history of breast cancer    Family history of colon cancer    Family history of uterine cancer    GERD (gastroesophageal reflux disease)    takes occasional TUMs   History of bronchitis    > 41yrs ago   History of colon polyps    History of hiatal hernia    Small noted on CT   History of pulmonary embolus (PE)    Hypertension    takes Losartan daily and HCTZ   Iron deficiency anemia    Joint pain    Numbness    to toes on each foot   Peripheral neuropathy    feet and toes   Personal history of radiation therapy    Pulmonary nodules    Noted on CT   Radiation 07/31/14-08/28/14   Left Breast 20 fxs   Seasonal allergies    takes Claritin prn   Urinary frequency    Vitamin D deficiency    takes VIt D daily    SURGICAL HISTORY: Past Surgical History:  Procedure Laterality Date   BREAST BIOPSY Bilateral    BREAST LUMPECTOMY Left    BREAST LUMPECTOMY WITH RADIOACTIVE SEED LOCALIZATION Left 07/01/2014   Procedure: LEFT BREAST LUMPECTOMY WITH RADIOACTIVE SEED LOCALIZATION;  Surgeon: Glenna Fellows, MD;  Location: [facility];  Service: General;  Laterality: Left;   CATARACT EXTRACTION Right    COLONOSCOPY  09/24/2019   [place]   LAPAROSCOPIC PARTIAL COLECTOMY N/A 11/01/2019   Procedure: LAPAROSCOPIC PARTIAL COLECTOMY;  Surgeon: [name], MD;  Location: [facility];  Service: General;  Laterality: N/A;   POLYPECTOMY     PORTACATH PLACEMENT N/A 12/10/2019   Procedure: INSERTION PORT-A-CATH ULTRASOUND GUIDED IN RIGHT IJ;  Surgeon: [name], MD;  Location: [facility];  Service: General;  Laterality: N/A;   ROBOTIC ASSISTED TOTAL HYSTERECTOMY Bilateral 12/22/2021   Procedure: XI ROBOTIC ASSISTED TOTAL HYSTERECTOMY WITH BILATERAL SALPINGO  OOPHORECTOMY;  Surgeon: Carver Fila, MD;  Location: [facility];  Service: Gynecology;  Laterality: Bilateral;   TOTAL KNEE ARTHROPLASTY Left 10/24/2012   Procedure: TOTAL KNEE ARTHROPLASTY;  Surgeon: Nestor Lewandowsky, MD;  Location: [facility];  Service: Orthopedics;  Laterality: Left;   TOTAL KNEE ARTHROPLASTY Right 01/09/2013   Procedure: TOTAL KNEE ARTHROPLASTY;  Surgeon: Nestor Lewandowsky, MD;  Location: [facility];  Service: Orthopedics;  Laterality: Right;   TUBAL LIGATION      I have reviewed the social history and family history with the patient and they are unchanged from previous note.  ALLERGIES:  is allergic to oxycodone.  MEDICATIONS:  Current Outpatient Medications  Medication Sig Dispense Refill   apixaban (ELIQUIS) 5 MG TABS tablet Take 1 tablet (5 mg total) by mouth 2 (two) times daily. 60 tablet 2   b complex vitamins capsule Take 1 capsule by mouth daily.     Cholecalciferol (VITAMIN D) 2000 UNITS CAPS Take 2,000 Units by mouth daily.      ibuprofen (ADVIL) 200 MG tablet Take 200 mg by mouth  daily as needed (pain).     lidocaine-prilocaine (EMLA) cream Apply 1 Application topically as needed. 30 g 0   loratadine (CLARITIN) 10 MG tablet Take 10 mg by mouth daily as needed for allergies.     losartan (COZAAR) 25 MG tablet Take 1 tablet (25 mg total) by mouth daily. 90 tablet 3   metFORMIN (GLUCOPHAGE) 500 MG tablet Take 1 tablet (500 mg total) by mouth in the morning and at bedtime. 180 tablet 3   Multiple Vitamins-Minerals (MULTIVITAMIN WITH MINERALS) tablet Take 1 tablet by mouth daily.     omeprazole (PRILOSEC) 20 MG capsule Take 20 mg by mouth daily before breakfast.      ondansetron (ZOFRAN) 8 MG tablet Take 1 tablet (8 mg total) by mouth every 8 (eight) hours as needed for nausea or vomiting. Start on the third day after chemotherapy. 30 tablet 1   potassium chloride (KLOR-CON) 10 MEQ tablet Take 1 tablet (10 mEq total) by mouth daily. 30 tablet 0   pravastatin (PRAVACHOL) 10 MG  tablet Take 1 tablet (10 mg total) by mouth daily. 90 tablet 3   pregabalin (LYRICA) 25 MG capsule Take 1 capsule (25 mg total) by mouth every morning. Take in addition to 50 mg at bedtime 60 capsule 2   pregabalin (LYRICA) 50 MG capsule Take 1 capsule (50 mg total) by mouth at bedtime. Take in addition to 25 mg in the morning 60 capsule 2   prochlorperazine (COMPAZINE) 10 MG tablet Take 1 tablet (10 mg total) by mouth every 6 (six) hours as needed for nausea or vomiting. 30 tablet 1   triamcinolone ointment (KENALOG) 0.5 % Apply 1 Application topically 2 (two) times daily. 30 g 0   No current facility-administered medications for this visit.    PHYSICAL EXAMINATION: ECOG PERFORMANCE STATUS: 1 - Symptomatic but completely ambulatory  Vitals:   12/19/22 0804  BP: (!) 121/54  Pulse: 62  Resp: 17  Temp: 98 F (36.7 C)  SpO2: 99%   Wt Readings from Last 3 Encounters:  12/19/22 212 lb 11.2 oz (96.5 kg)  12/05/22 216 lb 9.6 oz (98.2 kg)  11/21/22 215 lb 14.4 oz (97.9 kg)     GENERAL:alert, no distress and comfortable SKIN: skin color, texture, turgor are normal, no rashes or significant lesions EYES: normal, Conjunctiva are pink and non-injected, sclera clear OROPHARYNX:no exudate, no erythema and lips, buccal mucosa, and tongue normal NECK: supple, thyroid normal size, non-tender, without nodularity LYMPH:  no palpable lymphadenopathy in the cervical, axillary  LUNGS:(-) clear to auscultation and percussion with normal breathing effort HEART: regular rate & rhythm and no murmurs and no lower extremity edema ABDOMEN:abdomen soft, non-tender and normal bowel sounds Musculoskeletal:no cyanosis of digits and no clubbing  NEURO: alert & oriented x 3 with fluent speech, no focal motor/sensory deficits  LABORATORY DATA:  I have reviewed the data as listed    Latest Ref Rng & Units 12/19/2022    7:26 AM 12/05/2022    7:48 AM 11/21/2022    7:39 AM  CBC  WBC 4.0 - 10.5 K/uL 17.0  16.1   14.8   Hemoglobin 12.0 - 15.0 g/dL 38.7  56.4  33.2   Hematocrit 36.0 - 46.0 % 32.7  31.9  32.3   Platelets 150 - 400 K/uL 233  202  209         Latest Ref Rng & Units 12/19/2022    7:26 AM 12/05/2022    7:48 AM 11/21/2022  7:39 AM  CMP  Glucose 70 - 99 mg/dL 161  096  045   BUN 8 - 23 mg/dL 19  16  13    Creatinine 0.44 - 1.00 mg/dL 4.09  8.11  9.14   Sodium 135 - 145 mmol/L 143  143  142   Potassium 3.5 - 5.1 mmol/L 3.6  3.9  4.5   Chloride 98 - 111 mmol/L 110  111  109   CO2 22 - 32 mmol/L 24  25  26    Calcium 8.9 - 10.3 mg/dL 9.1  8.4  9.2   Total Protein 6.5 - 8.1 g/dL 6.5  5.9  5.9   Total Bilirubin 0.3 - 1.2 mg/dL 0.7  0.6  0.7   Alkaline Phos 38 - 126 U/L 142  135  131   AST 15 - 41 U/L 20  24  25    ALT 0 - 44 U/L 14  16  17        RADIOGRAPHIC STUDIES: I have personally reviewed the radiological images as listed and agreed with the findings in the report. No results found.    No orders of the defined types were placed in this encounter.  All questions were answered. The patient knows to call the clinic with any problems, questions or concerns. No barriers to learning was detected. The total time spent in the appointment was 25 minutes.     [name], MD 12/19/2022   Carolin Coy, CMA, am acting as scribe for [name], MD.   I have reviewed the above documentation for accuracy and completeness, and I agree with the above.

## 2022-12-18 NOTE — Assessment & Plan Note (Addendum)
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 Path showed overall Stage IIIB cancer.  -s/p 6 months adjuvant FOLFOX, completed 06/01/20, oxaliplatin held for final 2 cycles due to neuropathy.  -s/p BSO and hysterectomy on 12/22/21 with Dr. Pricilla Holm. Path revealed metastatic moderately differentiated colonic adenocarcinoma involving both ovaries and peritoneum -she restarted FOLFOX on 01/24/22. -Due to disease progression, chemotherapy changed to second line FOLFIRI and bevacizumab on April 06, 2022. She has been tolerating moderately well, better now with dose reduction, will continue -Restaging CT scan from 09/27/2022 showed continous response in liver and peritoneal metastasis, no other new lesions.   -We discussed maintenance therapy option in future  -due to her PE in 10/2022, will hold beva for 3-6 months  -restaging CT scheduled for 8/31

## 2022-12-18 NOTE — Assessment & Plan Note (Signed)
-  The patient understands the goal of care is palliative.  -pt has agreed with DNR, order placed.  

## 2022-12-18 NOTE — Assessment & Plan Note (Signed)
-  Dx in 05/2014, s/p left lumpectomy with Dr Johna Sheriff, adjuvant RT with Dr [name], and Anastrozole 08/2014 - 01/13/20 under Dr Pamelia Hoit.  -DEXA 09/11/20 T score +1.2 normal.  -most recent mammogram on 10/31/2022 was negative.

## 2022-12-18 NOTE — Assessment & Plan Note (Signed)
-  Secondary to oxaliplatin -She is on Lyrica and B complex, continue to monitor.  Overall stable.   

## 2022-12-18 NOTE — Assessment & Plan Note (Signed)
-  incidental findings of bilateral distal PEs on restaging CT 10/02/2022, pt was asymptomatic -doppler of LEs (-) DVT -continue Eliquis indefinitely  

## 2022-12-19 ENCOUNTER — Other Ambulatory Visit: Payer: Self-pay

## 2022-12-19 ENCOUNTER — Encounter: Payer: Self-pay | Admitting: Hematology

## 2022-12-19 ENCOUNTER — Inpatient Hospital Stay: Payer: Medicare Other

## 2022-12-19 ENCOUNTER — Inpatient Hospital Stay: Payer: Medicare Other | Admitting: Hematology

## 2022-12-19 VITALS — BP 121/54 | HR 62 | Temp 98.0°F | Resp 17 | Wt 212.7 lb

## 2022-12-19 DIAGNOSIS — C182 Malignant neoplasm of ascending colon: Secondary | ICD-10-CM

## 2022-12-19 DIAGNOSIS — C786 Secondary malignant neoplasm of retroperitoneum and peritoneum: Secondary | ICD-10-CM

## 2022-12-19 DIAGNOSIS — Z853 Personal history of malignant neoplasm of breast: Secondary | ICD-10-CM

## 2022-12-19 DIAGNOSIS — C7963 Secondary malignant neoplasm of bilateral ovaries: Secondary | ICD-10-CM

## 2022-12-19 DIAGNOSIS — Z5111 Encounter for antineoplastic chemotherapy: Secondary | ICD-10-CM | POA: Diagnosis not present

## 2022-12-19 DIAGNOSIS — Z7901 Long term (current) use of anticoagulants: Secondary | ICD-10-CM | POA: Diagnosis not present

## 2022-12-19 DIAGNOSIS — Z90710 Acquired absence of both cervix and uterus: Secondary | ICD-10-CM | POA: Diagnosis not present

## 2022-12-19 DIAGNOSIS — C787 Secondary malignant neoplasm of liver and intrahepatic bile duct: Secondary | ICD-10-CM

## 2022-12-19 DIAGNOSIS — Z90722 Acquired absence of ovaries, bilateral: Secondary | ICD-10-CM | POA: Diagnosis not present

## 2022-12-19 DIAGNOSIS — T451X5A Adverse effect of antineoplastic and immunosuppressive drugs, initial encounter: Secondary | ICD-10-CM

## 2022-12-19 DIAGNOSIS — Z95828 Presence of other vascular implants and grafts: Secondary | ICD-10-CM

## 2022-12-19 DIAGNOSIS — Z66 Do not resuscitate: Secondary | ICD-10-CM | POA: Diagnosis not present

## 2022-12-19 DIAGNOSIS — R200 Anesthesia of skin: Secondary | ICD-10-CM

## 2022-12-19 DIAGNOSIS — C50919 Malignant neoplasm of unspecified site of unspecified female breast: Secondary | ICD-10-CM

## 2022-12-19 DIAGNOSIS — I2699 Other pulmonary embolism without acute cor pulmonale: Secondary | ICD-10-CM | POA: Diagnosis not present

## 2022-12-19 DIAGNOSIS — Z170 Estrogen receptor positive status [ER+]: Secondary | ICD-10-CM

## 2022-12-19 DIAGNOSIS — I2694 Multiple subsegmental pulmonary emboli without acute cor pulmonale: Secondary | ICD-10-CM

## 2022-12-19 DIAGNOSIS — C563 Malignant neoplasm of bilateral ovaries: Secondary | ICD-10-CM | POA: Diagnosis not present

## 2022-12-19 DIAGNOSIS — R202 Paresthesia of skin: Secondary | ICD-10-CM

## 2022-12-19 DIAGNOSIS — G620 Drug-induced polyneuropathy: Secondary | ICD-10-CM | POA: Diagnosis not present

## 2022-12-19 DIAGNOSIS — Z5189 Encounter for other specified aftercare: Secondary | ICD-10-CM | POA: Diagnosis not present

## 2022-12-19 LAB — CBC WITH DIFFERENTIAL (CANCER CENTER ONLY)
Abs Immature Granulocytes: 1.16 10*3/uL — ABNORMAL HIGH (ref 0.00–0.07)
Basophils Absolute: 0.1 10*3/uL (ref 0.0–0.1)
Basophils Relative: 1 %
Eosinophils Absolute: 0.3 10*3/uL (ref 0.0–0.5)
Eosinophils Relative: 2 %
HCT: 32.7 % — ABNORMAL LOW (ref 36.0–46.0)
Hemoglobin: 10.3 g/dL — ABNORMAL LOW (ref 12.0–15.0)
Immature Granulocytes: 7 %
Lymphocytes Relative: 21 %
Lymphs Abs: 3.6 10*3/uL (ref 0.7–4.0)
MCH: 32.5 pg (ref 26.0–34.0)
MCHC: 31.5 g/dL (ref 30.0–36.0)
MCV: 103.2 fL — ABNORMAL HIGH (ref 80.0–100.0)
Monocytes Absolute: 1.2 10*3/uL — ABNORMAL HIGH (ref 0.1–1.0)
Monocytes Relative: 7 %
Neutro Abs: 10.6 10*3/uL — ABNORMAL HIGH (ref 1.7–7.7)
Neutrophils Relative %: 62 %
Platelet Count: 233 10*3/uL (ref 150–400)
RBC: 3.17 MIL/uL — ABNORMAL LOW (ref 3.87–5.11)
RDW: 16.6 % — ABNORMAL HIGH (ref 11.5–15.5)
WBC Count: 17 10*3/uL — ABNORMAL HIGH (ref 4.0–10.5)
nRBC: 0.1 % (ref 0.0–0.2)

## 2022-12-19 LAB — CMP (CANCER CENTER ONLY)
ALT: 14 U/L (ref 0–44)
AST: 20 U/L (ref 15–41)
Albumin: 3.9 g/dL (ref 3.5–5.0)
Alkaline Phosphatase: 142 U/L — ABNORMAL HIGH (ref 38–126)
Anion gap: 9 (ref 5–15)
BUN: 19 mg/dL (ref 8–23)
CO2: 24 mmol/L (ref 22–32)
Calcium: 9.1 mg/dL (ref 8.9–10.3)
Chloride: 110 mmol/L (ref 98–111)
Creatinine: 0.86 mg/dL (ref 0.44–1.00)
GFR, Estimated: >60 mL/min (ref >60)
Glucose, Bld: 111 mg/dL — ABNORMAL HIGH (ref 70–99)
Potassium: 3.6 mmol/L (ref 3.5–5.1)
Sodium: 143 mmol/L (ref 135–145)
Total Bilirubin: 0.7 mg/dL (ref 0.3–1.2)
Total Protein: 6.5 g/dL (ref 6.5–8.1)

## 2022-12-19 MED ORDER — SODIUM CHLORIDE 0.9 % IV SOLN
2400.0000 mg/m2 | INTRAVENOUS | Status: DC
  Administered 2022-12-19: 5000 mg via INTRAVENOUS
  Filled 2022-12-19: qty 100

## 2022-12-19 MED ORDER — PALONOSETRON HCL INJECTION 0.25 MG/5ML
0.2500 mg | Freq: Once | INTRAVENOUS | Status: AC
  Administered 2022-12-19: 0.25 mg via INTRAVENOUS
  Filled 2022-12-19: qty 5

## 2022-12-19 MED ORDER — ATROPINE SULFATE 1 MG/ML IV SOLN
0.5000 mg | Freq: Once | INTRAVENOUS | Status: AC | PRN
  Administered 2022-12-19: 0.5 mg via INTRAVENOUS
  Filled 2022-12-19: qty 1

## 2022-12-19 MED ORDER — PREGABALIN 25 MG PO CAPS
25.0000 mg | ORAL_CAPSULE | Freq: Every morning | ORAL | 2 refills | Status: DC
Start: 2022-12-19 — End: 2023-06-20

## 2022-12-19 MED ORDER — SODIUM CHLORIDE 0.9 % IV SOLN
100.0000 mg/m2 | Freq: Once | INTRAVENOUS | Status: AC
  Administered 2022-12-19: 220 mg via INTRAVENOUS
  Filled 2022-12-19: qty 11

## 2022-12-19 MED ORDER — SODIUM CHLORIDE 0.9 % IV SOLN: INTRAVENOUS | Status: DC

## 2022-12-19 MED ORDER — SODIUM CHLORIDE 0.9 % IV SOLN
400.0000 mg/m2 | Freq: Once | INTRAVENOUS | Status: AC
  Administered 2022-12-19: 896 mg via INTRAVENOUS
  Filled 2022-12-19: qty 44.8

## 2022-12-19 MED ORDER — SODIUM CHLORIDE 0.9 % IV SOLN
10.0000 mg | Freq: Once | INTRAVENOUS | Status: AC
  Administered 2022-12-19: 10 mg via INTRAVENOUS
  Filled 2022-12-19: qty 10

## 2022-12-19 MED ORDER — SODIUM CHLORIDE 0.9% FLUSH
10.0000 mL | INTRAVENOUS | Status: DC | PRN
  Administered 2022-12-19: 10 mL

## 2022-12-19 MED ORDER — SODIUM CHLORIDE 0.9% FLUSH: 10.0000 mL | INTRAVENOUS | Status: DC | PRN

## 2022-12-19 NOTE — Patient Instructions (Signed)
[facility]  Discharge Instructions: Thank you for choosing [facility] to provide your oncology and hematology care.   If you have a lab appointment with the Cancer Center, please go directly to the Cancer Center and check in at the registration area.   Wear comfortable clothing and clothing appropriate for easy access to any Portacath or PICC line.   We strive to give you quality time with your provider. You may need to reschedule your appointment if you arrive late (15 or more minutes).  Arriving late affects you and other patients whose appointments are after yours.  Also, if you miss three or more appointments without notifying the office, you may be dismissed from the clinic at the provider's discretion.      For prescription refill requests, have your pharmacy contact our office and allow 72 hours for refills to be completed.    Today you received the following chemotherapy and/or immunotherapy agents: irinotecan, leucovorin, fluorouracil      To help prevent nausea and vomiting after your treatment, we encourage you to take your nausea medication as directed.  BELOW ARE SYMPTOMS THAT SHOULD BE REPORTED IMMEDIATELY: *FEVER GREATER THAN 100.4 F (38 C) OR HIGHER *CHILLS OR SWEATING *NAUSEA AND VOMITING THAT IS NOT CONTROLLED WITH YOUR NAUSEA MEDICATION *UNUSUAL SHORTNESS OF BREATH *UNUSUAL BRUISING OR BLEEDING *URINARY PROBLEMS (pain or burning when urinating, or frequent urination) *BOWEL PROBLEMS (unusual diarrhea, constipation, pain near the anus) TENDERNESS IN MOUTH AND THROAT WITH OR WITHOUT PRESENCE OF ULCERS (sore throat, sores in mouth, or a toothache) UNUSUAL RASH, SWELLING OR PAIN  UNUSUAL VAGINAL DISCHARGE OR ITCHING   Items with * indicate a potential emergency and should be followed up as soon as possible or go to the Emergency Department if any problems should occur.  Please show the CHEMOTHERAPY ALERT CARD or  IMMUNOTHERAPY ALERT CARD at check-in to the Emergency Department and triage nurse.  Should you have questions after your visit or need to cancel or reschedule your appointment, please contact [facility]  Dept: [phone]  and follow the prompts.  Office hours are 8:00 a.m. to 4:30 p.m. Monday - Friday. Please note that voicemails left after 4:00 p.m. may not be returned until the following business day.  We are closed weekends and major holidays. You have access to a nurse at all times for urgent questions. Please call the main number to the clinic Dept: [phone] and follow the prompts.   For any non-urgent questions, you may also contact your provider using MyChart. We now offer e-Visits for anyone 18 and older to request care online for non-urgent symptoms. For details visit [email or web address].   Also download the MyChart app! Go to the app store, search "MyChart", open the app, select [facility], and log in with your MyChart username and password.   

## 2022-12-21 ENCOUNTER — Inpatient Hospital Stay: Payer: Medicare Other

## 2022-12-21 VITALS — BP 149/57 | HR 62 | Temp 98.3°F | Resp 16

## 2022-12-21 DIAGNOSIS — C787 Secondary malignant neoplasm of liver and intrahepatic bile duct: Secondary | ICD-10-CM | POA: Diagnosis not present

## 2022-12-21 DIAGNOSIS — Z853 Personal history of malignant neoplasm of breast: Secondary | ICD-10-CM | POA: Diagnosis not present

## 2022-12-21 DIAGNOSIS — I2699 Other pulmonary embolism without acute cor pulmonale: Secondary | ICD-10-CM | POA: Diagnosis not present

## 2022-12-21 DIAGNOSIS — Z90722 Acquired absence of ovaries, bilateral: Secondary | ICD-10-CM | POA: Diagnosis not present

## 2022-12-21 DIAGNOSIS — C182 Malignant neoplasm of ascending colon: Secondary | ICD-10-CM | POA: Diagnosis not present

## 2022-12-21 DIAGNOSIS — Z90710 Acquired absence of both cervix and uterus: Secondary | ICD-10-CM | POA: Diagnosis not present

## 2022-12-21 DIAGNOSIS — Z66 Do not resuscitate: Secondary | ICD-10-CM | POA: Diagnosis not present

## 2022-12-21 DIAGNOSIS — Z5189 Encounter for other specified aftercare: Secondary | ICD-10-CM | POA: Diagnosis not present

## 2022-12-21 DIAGNOSIS — Z7901 Long term (current) use of anticoagulants: Secondary | ICD-10-CM | POA: Diagnosis not present

## 2022-12-21 DIAGNOSIS — Z95828 Presence of other vascular implants and grafts: Secondary | ICD-10-CM

## 2022-12-21 DIAGNOSIS — Z5111 Encounter for antineoplastic chemotherapy: Secondary | ICD-10-CM | POA: Diagnosis not present

## 2022-12-21 DIAGNOSIS — C563 Malignant neoplasm of bilateral ovaries: Secondary | ICD-10-CM | POA: Diagnosis not present

## 2022-12-21 DIAGNOSIS — C786 Secondary malignant neoplasm of retroperitoneum and peritoneum: Secondary | ICD-10-CM | POA: Diagnosis not present

## 2022-12-21 DIAGNOSIS — G620 Drug-induced polyneuropathy: Secondary | ICD-10-CM | POA: Diagnosis not present

## 2022-12-21 MED ORDER — HEPARIN SOD (PORK) LOCK FLUSH 100 UNIT/ML IV SOLN
500.0000 [IU] | Freq: Once | INTRAVENOUS | Status: AC | PRN
  Administered 2022-12-21: 500 [IU]

## 2022-12-21 MED ORDER — PEGFILGRASTIM-CBQV 6 MG/0.6ML ~~LOC~~ SOSY
6.0000 mg | PREFILLED_SYRINGE | Freq: Once | SUBCUTANEOUS | Status: AC
  Administered 2022-12-21: 6 mg via SUBCUTANEOUS
  Filled 2022-12-21: qty 0.6

## 2022-12-21 MED ORDER — SODIUM CHLORIDE 0.9% FLUSH
10.0000 mL | INTRAVENOUS | Status: DC | PRN
  Administered 2022-12-21: 10 mL

## 2022-12-30 ENCOUNTER — Ambulatory Visit (HOSPITAL_COMMUNITY)
Admission: RE | Admit: 2022-12-30 | Discharge: 2022-12-30 | Disposition: A | Discharge status: Home / Self Care | Payer: Medicare Other | Source: Ambulatory Visit | Attending: Hematology | Admitting: Hematology

## 2022-12-30 DIAGNOSIS — C182 Malignant neoplasm of ascending colon: Secondary | ICD-10-CM | POA: Diagnosis not present

## 2022-12-30 DIAGNOSIS — C189 Malignant neoplasm of colon, unspecified: Secondary | ICD-10-CM | POA: Diagnosis not present

## 2022-12-30 DIAGNOSIS — N3289 Other specified disorders of bladder: Secondary | ICD-10-CM | POA: Diagnosis not present

## 2022-12-30 DIAGNOSIS — R932 Abnormal findings on diagnostic imaging of liver and biliary tract: Secondary | ICD-10-CM | POA: Diagnosis not present

## 2022-12-30 MED ORDER — SODIUM CHLORIDE (PF) 0.9 % IJ SOLN
INTRAMUSCULAR | Status: AC
  Filled 2022-12-30: qty 50

## 2022-12-30 MED ORDER — IOHEXOL 300 MG/ML  SOLN
100.0000 mL | Freq: Once | INTRAMUSCULAR | Status: AC | PRN
  Administered 2022-12-30: 100 mL via INTRAVENOUS

## 2022-12-30 MED ORDER — IOHEXOL 9 MG/ML PO SOLN
500.0000 mL | ORAL | Status: AC
  Administered 2022-12-30: 1000 mL via ORAL

## 2022-12-30 MED ORDER — IOHEXOL 9 MG/ML PO SOLN
ORAL | Status: AC
  Filled 2022-12-30: qty 1000

## 2022-12-30 MED ORDER — HEPARIN SOD (PORK) LOCK FLUSH 100 UNIT/ML IV SOLN
INTRAVENOUS | Status: AC
  Filled 2022-12-30: qty 5

## 2022-12-30 MED FILL — Dexamethasone Sodium Phosphate Inj 100 MG/10ML: INTRAMUSCULAR | Qty: 1 | Status: AC

## 2022-12-30 NOTE — Progress Notes (Unsigned)
Patient Care Team: [name], MD as PCP - General (Family Medicine) [name], MD as Consulting Physician (General Surgery) [name], MD as Consulting Physician (Hematology) [name], MD as Consulting Physician (General Surgery) [name], MD as Consulting Physician (Hematology and Oncology)   CHIEF COMPLAINT: Follow up metastatic colon cancer   Oncology History Overview Note   Cancer Staging  Malignant neoplasm of female breast [facility]) Staging form: Breast, AJCC 7th Edition - Clinical stage from 06/11/2014: Stage 0 (Tis (DCIS), N0, M0) - Unsigned Staged by: Pathologist and managing physician Laterality: Left Estrogen receptor status: Positive Progesterone receptor status: Positive Stage used in treatment planning: Yes National guidelines used in treatment planning: Yes Type of national guideline used in treatment planning: NCCN - Pathologic stage from 07/03/2014: Stage Unknown (Tis (DCIS), NX, cM0) - Signed by [name], MD on 07/10/2014 Staged by: Pathologist Laterality: Left Estrogen receptor status: Positive Progesterone receptor status: Positive Stage used in treatment planning: Yes National guidelines used in treatment planning: Yes Type of national guideline used in treatment planning: NCCN Staging comments: Staged on final lumpectomy specimen by Dr. [name].  Right colon cancer Staging form: Colon and Rectum, AJCC 8th Edition - Pathologic stage from 11/01/2019: Stage IIIB (pT3, pN2a, cM0) - Signed by [name], MD on 11/29/2019 Stage prefix: Initial diagnosis Histologic grading system: 4 grade system Histologic grade (G): G2 Residual tumor (R): R0 - None Tumor deposits (TD): Absent Perineural invasion (PNI): Absent Microsatellite instability (MSI): Stable KRAS mutation: Unknown NRAS mutation: Unknown BRAF mutation: Unknown     Malignant neoplasm of female breast (HCC)  05/29/2014 Initial Biopsy   Left breast needle core biopsy: Grade 2,  DCIS with calcs. ER+ (100%), PR+ (96%).    06/04/2014 Initial Diagnosis   Left breast DCIS with calcifications, ER 100%, PR 96%   06/10/2014 Breast MRI   Left breast: 2.4 x 1.3 x 1.1 cm area of patchy non-mass enhancement upper outer quadrant includes postbiopsy seroma; Right breast: 1.2 cm previously biopsied stable benign fibroadenoma   06/12/2014 Procedure   Genetic counseling/testing: Identified 1 VUS on CHEK2 gene. Remainder of 17 gene panel tested negative and included: ATM, BARD1, BRCA 1/2, BRIP1, CDH1, CHEK2, EPCAM, MLH1, MSH2, MSH6, NBN, NF1, PALB2, PTEN, RAD50, RAD51C, RAD51D, STK11, and TP53.    07/01/2014 Surgery   Left breast lumpectomy ([facility]): Grade 1, DCIS, spanning 2.3 cm, 1 mm margin, ER 100%, PR 96%   07/31/2014 - 08/28/2014 Radiation Therapy   Adjuvant RT completed [name]). Left breast: Total dose 42.5 Gy over 17 fractions. Left breast boost: Total dose 7.5 Gy over 3 fractions.    09/14/2014 - 01/13/2020 Anti-estrogen oral therapy   Anastrazole 1mg  daily. Planned duration of treatment: 5 years [place]). Completed in 01/2020.    09/25/2014 Survivorship   Survivorship Care Plan given to patient and reviewed with her in person.    03/02/2021 Imaging   CT CAP  IMPRESSION: 1. No findings of active/recurrent malignancy. Partial right hemicolectomy. 2. Endometrial stripe remains mildly thickened, but endometrial biopsy in May was negative for malignancy. 3. Progressive endplate sclerosis and endplate irregularity at T2-3, probably due to degenerative endplate findings. If the has referable upper thoracic pain/symptoms then thoracic spine MRI could be used for further workup. 4. Other imaging findings of potential clinical significance: Mild cardiomegaly. Aortic Atherosclerosis (ICD10-I70.0). Mild mitral valve calcification. Postoperative findings in the left breast with adjacent radiation port anteriorly in the left upper lobe. Tiny pulmonary nodules in the left lower lobe  are unchanged from earliest available comparison of 10/17/2019 and probably benign although may merit surveillance. Multilevel lumbar impingement. Mild pelvic floor laxity.   Malignant neoplasm of ascending colon (HCC)  09/19/2019 Imaging   CT AP W contrast 09/19/19  IMPRESSION Fullness in the cecum, cannot exclude a mass. No evidence for metastatic disease is identified.    09/24/2019 Procedure   Colonoscopy by Dr [name] 09/24/19 IMPRESSION 1. The colon was redundant  2. Mild diverticulosis was noted through the entire examined colon 3. Single 12mm polyp was found in the ascending colon; polypectomy was performed using snare cautery and biopsy forceps 4. Mild diverticulosis was notes in the descending colon and sigmoid colon.  5. Single polyp was found in the sigmoid colon, polypectomy was performed with cold forceps.  6. Single polyp was found in the rectosigmoid colon; polypectomy was performed with cold snare  7. Small internal hemorrhoids  8. Large mass was found at the cecum; multiple biopsies of the area were performed using cold forceps; injection (tattooing) was performed distal to the mass.    09/24/2019 Initial Biopsy   INTERPRETATION AND DIAGNOSIS:  A. Cecum, biopsy:  Invasive moderately differentiated adenocarcinoma.  see comment  B. Polyp @ ascending colon, polypectomy:  Tubular Adenoma  C. Polyp @ sigmoid colon Polypectomy:  hyperplastic polyp.  D. Polyp @ rectosigmoid colon, Polypectomy:  Hyperplastic Polyp      10/16/2019 Imaging   CT Chest IMPRESSION: 1. Multiple small pulmonary nodules measuring 5 mm or less in size in the lungs. These are nonspecific and are typically considered statistically likely benign. However, given the patient's history of primary malignancy, close attention on follow-up studies is recommended to ensure stability. 2. Aortic atherosclerosis, in addition to right coronary artery disease. Assessment for potential risk factor modification,  dietary therapy or pharmacologic therapy may be warranted, if clinically indicated. 3. There are calcifications of the aortic valve and mitral annulus. Echocardiographic correlation for evaluation of potential valvular dysfunction may be warranted if clinically indicated. 4. Small hiatal hernia.   Aortic Atherosclerosis (ICD10-I70.0).   11/01/2019 Initial Diagnosis   Colon cancer (HCC)   11/01/2019 Surgery   LAPAROSCOPIC PARTIAL COLECTOMY by Dr Maisie Fus and Dr Michaell Cowing   11/01/2019 Pathology Results   FINAL MICROSCOPIC DIAGNOSIS:   A. COLON, PROXIMAL RIGHT, COLECTOMY:  - Invasive colonic adenocarcinoma, 5 cm.  - Tumor invades through the muscularis propria into pericolonic tissues.   - Margins of resection are not involved.  - Metastatic carcinoma in (5) of (13) lymph nodes.  - See oncology table.    MSI Stable  Mismatch repair normal  MLH1 - Preserved nuclear expression (greater 50% tumor expression) MSH2 - Preserved nuclear expression (greater 50% tumor expression) MSH6 - Preserved nuclear expression (greater 50% tumor expression) PMS2 - Preserved nuclear expression (greater 50% tumor expression)   11/01/2019 Cancer Staging   Staging form: Colon and Rectum, AJCC 8th Edition - Pathologic stage from 11/01/2019: Stage IIIB (pT3, pN2a, cM0) - Signed by [name], MD on 11/29/2019   12/10/2019 Procedure   PAC placed 12/10/19   12/17/2019 - 06/01/2020 Chemotherapy   FOLFOX q2weeks starting in 2 weeks starting 12/17/19. Held 01/27/20-02/10/20 due to b/l PE. Oxaliplatin held C11-12 due to neuropathy. Completed on 06/01/20   03/02/2021 Imaging   CT CAP  IMPRESSION: 1. No findings of active/recurrent malignancy. Partial right hemicolectomy. 2. Endometrial stripe remains mildly thickened, but endometrial biopsy in May was negative for malignancy. 3. Progressive endplate sclerosis and endplate irregularity at T2-3, probably due to  degenerative endplate findings. If the has referable upper  thoracic pain/symptoms then thoracic spine MRI could be used for further workup. 4. Other imaging findings of potential clinical significance: Mild cardiomegaly. Aortic Atherosclerosis (ICD10-I70.0). Mild mitral valve calcification. Postoperative findings in the left breast with adjacent radiation port anteriorly in the left upper lobe. Tiny pulmonary nodules in the left lower lobe are unchanged from earliest available comparison of 10/17/2019 and probably benign although may merit surveillance. Multilevel lumbar impingement. Mild pelvic floor laxity.   08/26/2021 Imaging   EXAM: CT CHEST, ABDOMEN, AND PELVIS WITH CONTRAST  IMPRESSION: 1. Stable examination without new or progressive findings to suggest local recurrence or metastatic disease within the chest, abdomen, or pelvis. 2. Hepatomegaly with hepatic steatosis. 3. Sigmoid colonic diverticulosis without findings of acute diverticulitis. 4. Similar prominent endplate sclerosis and irregularity at T2-T3 is most consistent with Modic type endplate degenerative changes. However, if patient has referable upper thoracic pain consider further workup with thoracic spine MRI. 5. Similar thickening of the endometrial stripe measuring up to 8 mm, which was previously biopsied with results negative for malignancy. 6. Aortic Atherosclerosis (ICD10-I70.0).   11/10/2021 PET scan   IMPRESSION: 1. LEFT ovary is increased in size and is intensely hypermetabolic. While physiologic hypermetabolic ovarian tissue is not uncommon, the enlargement and asymmetric activity warrants further evaluation. Consider contrast pelvic MRI vs tissue sampling. 2. No evidence of metastatic colorectal carcinoma otherwise. 3. Post RIGHT hemicolectomy anatomy. 4. Evidence of radiation change in the LEFT upper lobe (remote breast cancer).     12/22/2021 Relapse/Recurrence    FINAL MICROSCOPIC DIAGNOSIS:   A. LEFT OVARY AND FALLOPIAN TUBE, SALPINGO OOPHORECTOMY:  -  Metastatic moderately differentiated colonic adenocarcinoma involving left ovary  - Focal ovarian stromal calcification  - Segment of benign fallopian tube   B. UTERUS WITH RIGHT FALLOPIAN TUBE AND OVARY, HYSTERECTOMY AND SALPINGO-OOPHORECTOMY:  - Metastatic moderately differentiated colonic adenocarcinoma involving right ovary  - Focal invasive extensive adenomyosis  - Benign endometrial polyps  - Benign proliferative phase endometrium  - Hydrosalpinx of right fallopian tube  - Focal ovarian stromal calcification   C. PERITONEAL DEPOSITS, ANTERIOR CUL DE SAC, BIOPSY:  - Metastatic mucinous adenocarcinoma, consistent with colorectal primary    COMMENT:  Immunohistochemical stains show that the tumor cells are positive for CK20 and CDX2 while they are negative for CK7 and PAX8, consistent with above interpretation.    01/24/2022 - 03/23/2022 Chemotherapy   Patient is on Treatment Plan : COLORECTAL FOLFOX q14d x 3 months     04/01/2022 Imaging   CT AP IMPRESSION: 1. New omental metastatic disease. 2. Vague hypoattenuating lesion in the inferior right hepatic lobe is new and also worrisome for metastatic disease. 3. Similar small right lower lobe nodules. Recommend continued attention on follow-up. 4. Steatotic enlarged liver. 5.  Aortic atherosclerosis (ICD10-I70.0).   04/06/2022 -  Chemotherapy   Patient is on Treatment Plan : COLORECTAL FOLFIRI + Bevacizumab q14d     04/21/2022 Imaging    IMPRESSION: 1. Stable chest CT. No evidence of metastatic disease. 2. Stable small pulmonary nodules, considered benign based on stability. 3. Stable postsurgical changes in the left breast and anterior left upper lobe radiation changes. 4. Aortic Atherosclerosis (ICD10-I70.0) and Emphysema (ICD10-J43.9).   04/21/2022 Imaging    IMPRESSION: 1. Small enhancing lesion inferiorly in the right hepatic lobe is typical of metastatic disease and stable from recent abdominal CT. No other  definite liver lesions are identified. Mild hepatic steatosis. 2. No other significant abdominal  findings. 3. Omental nodularity seen on CT is not well visualized by MRI.    Imaging     06/23/2022 Imaging    IMPRESSION: 1. Hypodense lesion of the inferior right lobe of the liver, hepatic segment VI is diminished in size, consistent with treatment response. 2. Tiny peritoneal and omental nodules identified by prior examination are diminished in size, consistent with treatment response. 3. Multiple tiny pulmonary nodules unchanged, most likely benign and incidental, however continued attention on follow-up warranted in the setting of known metastatic disease. 4. No evidence of new metastatic disease in the chest, abdomen, or pelvis. 5. Status post partial right hemicolectomy and reanastomosis. 6. Diffuse mosaic attenuation of the airspaces, consistent with small airways disease. 7. Hepatomegaly.      CURRENT THERAPY:  FOLFIRI/Beva q14 days, starting 04/06/22. Beva on hold 3-6 months stopped due to PE on restaging CT 10/2022   INTERVAL HISTORY Ms. Morano returns for follow up as scheduled. Last seen by Dr. Mosetta Putt 12/19/22 and received another cycle of FOLFIRI. Restaging on 8/31   ROS   Past Medical History:  Diagnosis Date   Aortic atherosclerosis (HCC)    Arthritis    feet, lower back   Basal cell carcinoma    arm   Breast cancer of upper-outer quadrant of left female breast (HCC) 06/04/2014   Cataract    immature on the left   Colon cancer (HCC) 08/2019   Diabetes mellitus without complication (HCC)    Diverticulosis    Dizziness    > 44yrs ago;took Antivert    Family history of anesthesia complication    sister slow to wake up with anesthesia   Family history of breast cancer    Family history of colon cancer    Family history of uterine cancer    GERD (gastroesophageal reflux disease)    takes occasional TUMs   History of bronchitis    > 4yrs ago   History of  colon polyps    History of hiatal hernia    Small noted on CT   History of pulmonary embolus (PE)    Hypertension    takes Losartan daily and HCTZ   Iron deficiency anemia    Joint pain    Numbness    to toes on each foot   Peripheral neuropathy    feet and toes   Personal history of radiation therapy    Pulmonary nodules    Noted on CT   Radiation 07/31/14-08/28/14   Left Breast 20 fxs   Seasonal allergies    takes Claritin prn   Urinary frequency    Vitamin D deficiency    takes VIt D daily     Past Surgical History:  Procedure Laterality Date   BREAST BIOPSY Bilateral    BREAST LUMPECTOMY Left    BREAST LUMPECTOMY WITH RADIOACTIVE SEED LOCALIZATION Left 07/01/2014   Procedure: LEFT BREAST LUMPECTOMY WITH RADIOACTIVE SEED LOCALIZATION;  Surgeon: Glenna Fellows, MD;  Location:  [facility];  Service: General;  Laterality: Left;   CATARACT EXTRACTION Right    COLONOSCOPY  09/24/2019   [place]   LAPAROSCOPIC PARTIAL COLECTOMY N/A 11/01/2019   Procedure: LAPAROSCOPIC PARTIAL COLECTOMY;  Surgeon: [name], MD;  Location: [facility];  Service: General;  Laterality: N/A;   POLYPECTOMY     PORTACATH PLACEMENT N/A 12/10/2019   Procedure: INSERTION PORT-A-CATH ULTRASOUND GUIDED IN RIGHT IJ;  Surgeon: [name], MD;  Location: [facility];  Service: General;  Laterality: N/A;   ROBOTIC ASSISTED TOTAL  HYSTERECTOMY Bilateral 12/22/2021   Procedure: XI ROBOTIC ASSISTED TOTAL HYSTERECTOMY WITH BILATERAL SALPINGO OOPHORECTOMY;  Surgeon: Carver Fila, MD;  Location: [facility];  Service: Gynecology;  Laterality: Bilateral;   TOTAL KNEE ARTHROPLASTY Left 10/24/2012   Procedure: TOTAL KNEE ARTHROPLASTY;  Surgeon: Nestor Lewandowsky, MD;  Location: [facility];  Service: Orthopedics;  Laterality: Left;   TOTAL KNEE ARTHROPLASTY Right 01/09/2013   Procedure: TOTAL KNEE ARTHROPLASTY;  Surgeon: Nestor Lewandowsky, MD;  Location: [facility];  Service: Orthopedics;  Laterality: Right;   TUBAL  LIGATION       Outpatient Encounter Medications as of 01/03/2023  Medication Sig   apixaban (ELIQUIS) 5 MG TABS tablet Take 1 tablet (5 mg total) by mouth 2 (two) times daily.   b complex vitamins capsule Take 1 capsule by mouth daily.   Cholecalciferol (VITAMIN D) 2000 UNITS CAPS Take 2,000 Units by mouth daily.    ibuprofen (ADVIL) 200 MG tablet Take 200 mg by mouth daily as needed (pain).   lidocaine-prilocaine (EMLA) cream Apply 1 Application topically as needed.   loratadine (CLARITIN) 10 MG tablet Take 10 mg by mouth daily as needed for allergies.   losartan (COZAAR) 25 MG tablet Take 1 tablet (25 mg total) by mouth daily.   metFORMIN (GLUCOPHAGE) 500 MG tablet Take 1 tablet (500 mg total) by mouth in the morning and at bedtime.   Multiple Vitamins-Minerals (MULTIVITAMIN WITH MINERALS) tablet Take 1 tablet by mouth daily.   omeprazole (PRILOSEC) 20 MG capsule Take 20 mg by mouth daily before breakfast.    ondansetron (ZOFRAN) 8 MG tablet Take 1 tablet (8 mg total) by mouth every 8 (eight) hours as needed for nausea or vomiting. Start on the third day after chemotherapy.   potassium chloride (KLOR-CON) 10 MEQ tablet Take 1 tablet (10 mEq total) by mouth daily.   pravastatin (PRAVACHOL) 10 MG tablet Take 1 tablet (10 mg total) by mouth daily.   pregabalin (LYRICA) 25 MG capsule Take 1 capsule (25 mg total) by mouth every morning. Take in addition to 50 mg at bedtime   pregabalin (LYRICA) 50 MG capsule Take 1 capsule (50 mg total) by mouth at bedtime. Take in addition to 25 mg in the morning   prochlorperazine (COMPAZINE) 10 MG tablet Take 1 tablet (10 mg total) by mouth every 6 (six) hours as needed for nausea or vomiting.   triamcinolone ointment (KENALOG) 0.5 % Apply 1 Application topically 2 (two) times daily.   No facility-administered encounter medications on file as of 01/03/2023.     There were no vitals filed for this visit. There is no height or weight on file to calculate BMI.    PHYSICAL EXAM GENERAL:alert, no distress and comfortable SKIN: no rash  EYES: sclera clear NECK: without mass LYMPH:  no palpable cervical or supraclavicular lymphadenopathy  LUNGS: clear with normal breathing effort HEART: regular rate & rhythm, no lower extremity edema ABDOMEN: abdomen soft, non-tender and normal bowel sounds NEURO: alert & oriented x 3 with fluent speech, no focal motor/sensory deficits Breast exam:  PAC without erythema    CBC    Component Value Date/Time   WBC 17.0 (H) 12/19/2022 0726   WBC 12.7 (H) 10/03/2022 0611   RBC 3.17 (L) 12/19/2022 0726   HGB 10.3 (L) 12/19/2022 0726   HGB 13.0 10/03/2014 0914   HCT 32.7 (L) 12/19/2022 0726   HCT 38.1 10/03/2014 0914   PLT 233 12/19/2022 0726   PLT 310 10/03/2014 0914   MCV  103.2 (H) 12/19/2022 0726   MCV 93.9 10/03/2014 0914   MCH 32.5 12/19/2022 0726   MCHC 31.5 12/19/2022 0726   RDW 16.6 (H) 12/19/2022 0726   RDW 13.8 10/03/2014 0914   LYMPHSABS 3.6 12/19/2022 0726   LYMPHSABS 2.0 10/03/2014 0914   MONOABS 1.2 (H) 12/19/2022 0726   MONOABS 0.6 10/03/2014 0914   EOSABS 0.3 12/19/2022 0726   EOSABS 0.2 10/03/2014 0914   BASOSABS 0.1 12/19/2022 0726   BASOSABS 0.1 10/03/2014 0914     CMP     Component Value Date/Time   NA 143 12/19/2022 0726   NA 144 10/03/2014 0914   K 3.6 12/19/2022 0726   K 4.6 10/03/2014 0914   CL 110 12/19/2022 0726   CO2 24 12/19/2022 0726   CO2 29 10/03/2014 0914   GLUCOSE 111 (H) 12/19/2022 0726   GLUCOSE 133 10/03/2014 0914   BUN 19 12/19/2022 0726   BUN 17.8 10/03/2014 0914   CREATININE 0.86 12/19/2022 0726   CREATININE 0.8 10/03/2014 0914   CALCIUM 9.1 12/19/2022 0726   CALCIUM 9.7 10/03/2014 0914   PROT 6.5 12/19/2022 0726   PROT 7.0 10/03/2014 0914   ALBUMIN 3.9 12/19/2022 0726   ALBUMIN 3.7 10/03/2014 0914   AST 20 12/19/2022 0726   AST 18 10/03/2014 0914   ALT 14 12/19/2022 0726   ALT 13 10/03/2014 0914   ALKPHOS 142 (H) 12/19/2022 0726   ALKPHOS 74  10/03/2014 0914   BILITOT 0.7 12/19/2022 0726   BILITOT 0.98 10/03/2014 0914   GFRNONAA >60 12/19/2022 0726   GFRAA >60 01/30/2020 0429   GFRAA >60 01/27/2020 0834     ASSESSMENT & PLAN:Kim Castro is a 77 y.o. female with    1. Right colon cancer, pT3N2aM0 stage IIB, MSS -Diagnosed in 08/2019 , staging CT chest showed multiple small lung nodules that were nonspecific but likely benign.  -S/p surgery with Dr Maisie Fus on 11/01/19. Path showed overall Stage IIIB cancer.  -S/p 6 months of adjuvant FOLFOX completed 05/2020.  -Due to rising CEA she underwent a PET scan 11/10/2021 which showed hypermetabolism and enlargement to the left ovary, no other evidence of metastatic disease  -S/p total hysterectomy 12/22/2021 by Dr. Pricilla Holm, path revealed metastatic moderately differentiated colonic adenocarcinoma involving both ovaries with peritoneal deposits also positive for metastatic mucinous adenocarcinoma consistent with colorectal primary -[facility] showed K-ras mutation, unfortunately she is not eligible for any targeted therapy -She restarted FOLFOX 01/24/2022 - 04/2022, stopped due to disease progression. Started FOLFIRI/Beva 04/2022 -restaging CT 10/2022 showed partial response in liver and peritoneal metastasis, no new lesions, and incidental bilateral PE; Beva was stopped (due to PE), she continues FOLFIRI q2 weeks   3. Neuropathy, secondary to Oxaliplatin G2 -S/p C7 she started having numbness in hands, L>R which become prolonged with tingling s/p C10.  -Oxaliplatin dose reduced and ultimately held with C11-12.  -Controlled on Lyrica 25 mg BID and B vit complex -Neuropathy has slightly increased on FOLFIRI, I increase Lyrica to 25 mg a.m. and 50 mg p.m.   4. H/o left breast DCIS, G2, ER/PR+ -Dx in 05/2014. S/p left lumpectomy with Dr Johna Sheriff, adjuvant RT with Dr [name], and adjuvant Anastrozole 08/2014-01/13/20 under the care of Dr Pamelia Hoit.  -DEXA 09/11/20 T score +1.2 normal.   -Mammogram 10/04/2021 was negative -No hypermetabolism in the breast on PET to suggest recurrence    5. Submassive bilateral pulmonary embolism, recurrent PE 10/2022 -S/p C3 chemo, her 01/27/20 CTA showed PE with right heart strain.  Doppler negative for DVT.  -She was treated with 6 months of Eliquis -Recurrent PE on restaging CT 10/2022, incidental; she was asymptomatic -Will continue Eliquis indefinitely, refilled   6. Comorbidities: Arthritis, DM, HTN, GERD, neck and back pain with sciatica -she switched to Dr. Veto Kemps for primary care in 05/2021. -Chronic back pain is stable, no new pain        PLAN:  No orders of the defined types were placed in this encounter.     All questions were answered. The patient knows to call the clinic with any problems, questions or concerns. No barriers to learning were detected. I spent *** counseling the patient face to face. The total time spent in the appointment was *** and more than 50% was on counseling, review of test results, and coordination of care.   Santiago Glad, NP-C @DATE @

## 2023-01-03 ENCOUNTER — Inpatient Hospital Stay: Payer: Medicare Other | Admitting: Nurse Practitioner

## 2023-01-03 ENCOUNTER — Encounter: Payer: Self-pay | Admitting: Nurse Practitioner

## 2023-01-03 ENCOUNTER — Other Ambulatory Visit: Payer: Self-pay

## 2023-01-03 ENCOUNTER — Inpatient Hospital Stay: Payer: Medicare Other

## 2023-01-03 ENCOUNTER — Inpatient Hospital Stay: Payer: Medicare Other | Attending: Hematology

## 2023-01-03 ENCOUNTER — Encounter: Payer: Self-pay | Admitting: Hematology

## 2023-01-03 VITALS — BP 122/60 | HR 52

## 2023-01-03 VITALS — BP 140/62 | HR 64 | Temp 97.6°F | Resp 18 | Ht 65.5 in | Wt 217.4 lb

## 2023-01-03 DIAGNOSIS — Z86711 Personal history of pulmonary embolism: Secondary | ICD-10-CM | POA: Insufficient documentation

## 2023-01-03 DIAGNOSIS — C180 Malignant neoplasm of cecum: Secondary | ICD-10-CM | POA: Diagnosis not present

## 2023-01-03 DIAGNOSIS — Z8049 Family history of malignant neoplasm of other genital organs: Secondary | ICD-10-CM | POA: Insufficient documentation

## 2023-01-03 DIAGNOSIS — C7963 Secondary malignant neoplasm of bilateral ovaries: Secondary | ICD-10-CM | POA: Insufficient documentation

## 2023-01-03 DIAGNOSIS — G620 Drug-induced polyneuropathy: Secondary | ICD-10-CM | POA: Diagnosis not present

## 2023-01-03 DIAGNOSIS — Z5111 Encounter for antineoplastic chemotherapy: Secondary | ICD-10-CM | POA: Diagnosis not present

## 2023-01-03 DIAGNOSIS — Z803 Family history of malignant neoplasm of breast: Secondary | ICD-10-CM | POA: Insufficient documentation

## 2023-01-03 DIAGNOSIS — Z90710 Acquired absence of both cervix and uterus: Secondary | ICD-10-CM | POA: Insufficient documentation

## 2023-01-03 DIAGNOSIS — E1142 Type 2 diabetes mellitus with diabetic polyneuropathy: Secondary | ICD-10-CM | POA: Insufficient documentation

## 2023-01-03 DIAGNOSIS — C779 Secondary and unspecified malignant neoplasm of lymph node, unspecified: Secondary | ICD-10-CM | POA: Insufficient documentation

## 2023-01-03 DIAGNOSIS — Z5189 Encounter for other specified aftercare: Secondary | ICD-10-CM | POA: Insufficient documentation

## 2023-01-03 DIAGNOSIS — I10 Essential (primary) hypertension: Secondary | ICD-10-CM | POA: Diagnosis not present

## 2023-01-03 DIAGNOSIS — C182 Malignant neoplasm of ascending colon: Secondary | ICD-10-CM

## 2023-01-03 DIAGNOSIS — Z800 Family history of malignant neoplasm of digestive organs: Secondary | ICD-10-CM | POA: Insufficient documentation

## 2023-01-03 DIAGNOSIS — Z86000 Personal history of in-situ neoplasm of breast: Secondary | ICD-10-CM | POA: Insufficient documentation

## 2023-01-03 DIAGNOSIS — R918 Other nonspecific abnormal finding of lung field: Secondary | ICD-10-CM | POA: Insufficient documentation

## 2023-01-03 DIAGNOSIS — C787 Secondary malignant neoplasm of liver and intrahepatic bile duct: Secondary | ICD-10-CM | POA: Diagnosis not present

## 2023-01-03 DIAGNOSIS — Z7901 Long term (current) use of anticoagulants: Secondary | ICD-10-CM | POA: Diagnosis not present

## 2023-01-03 DIAGNOSIS — Z95828 Presence of other vascular implants and grafts: Secondary | ICD-10-CM

## 2023-01-03 LAB — CBC WITH DIFFERENTIAL (CANCER CENTER ONLY)
Abs Immature Granulocytes: 1.45 10*3/uL — ABNORMAL HIGH (ref 0.00–0.07)
Basophils Absolute: 0.1 10*3/uL (ref 0.0–0.1)
Basophils Relative: 0 %
Eosinophils Absolute: 0.3 10*3/uL (ref 0.0–0.5)
Eosinophils Relative: 2 %
HCT: 31.2 % — ABNORMAL LOW (ref 36.0–46.0)
Hemoglobin: 9.9 g/dL — ABNORMAL LOW (ref 12.0–15.0)
Immature Granulocytes: 9 %
Lymphocytes Relative: 22 %
Lymphs Abs: 3.3 10*3/uL (ref 0.7–4.0)
MCH: 32.8 pg (ref 26.0–34.0)
MCHC: 31.7 g/dL (ref 30.0–36.0)
MCV: 103.3 fL — ABNORMAL HIGH (ref 80.0–100.0)
Monocytes Absolute: 1 10*3/uL (ref 0.1–1.0)
Monocytes Relative: 7 %
Neutro Abs: 9.3 10*3/uL — ABNORMAL HIGH (ref 1.7–7.7)
Neutrophils Relative %: 60 %
Platelet Count: 210 10*3/uL (ref 150–400)
RBC: 3.02 MIL/uL — ABNORMAL LOW (ref 3.87–5.11)
RDW: 16.6 % — ABNORMAL HIGH (ref 11.5–15.5)
Smear Review: NORMAL
WBC Count: 15.4 10*3/uL — ABNORMAL HIGH (ref 4.0–10.5)
nRBC: 0.1 % (ref 0.0–0.2)

## 2023-01-03 LAB — CMP (CANCER CENTER ONLY)
ALT: 16 U/L (ref 0–44)
AST: 21 U/L (ref 15–41)
Albumin: 3.8 g/dL (ref 3.5–5.0)
Alkaline Phosphatase: 122 U/L (ref 38–126)
Anion gap: 6 (ref 5–15)
BUN: 14 mg/dL (ref 8–23)
CO2: 26 mmol/L (ref 22–32)
Calcium: 8.8 mg/dL — ABNORMAL LOW (ref 8.9–10.3)
Chloride: 112 mmol/L — ABNORMAL HIGH (ref 98–111)
Creatinine: 0.75 mg/dL (ref 0.44–1.00)
GFR, Estimated: >60 mL/min (ref >60)
Glucose, Bld: 104 mg/dL — ABNORMAL HIGH (ref 70–99)
Potassium: 3.6 mmol/L (ref 3.5–5.1)
Sodium: 144 mmol/L (ref 135–145)
Total Bilirubin: 0.8 mg/dL (ref 0.3–1.2)
Total Protein: 6 g/dL — ABNORMAL LOW (ref 6.5–8.1)

## 2023-01-03 LAB — CEA (ACCESS): CEA (CHCC): 5.76 ng/mL — ABNORMAL HIGH (ref 0.00–5.00)

## 2023-01-03 MED ORDER — SODIUM CHLORIDE 0.9% FLUSH
10.0000 mL | INTRAVENOUS | Status: DC | PRN
  Administered 2023-01-03: 10 mL

## 2023-01-03 MED ORDER — PALONOSETRON HCL INJECTION 0.25 MG/5ML
0.2500 mg | Freq: Once | INTRAVENOUS | Status: AC
  Administered 2023-01-03: 0.25 mg via INTRAVENOUS
  Filled 2023-01-03: qty 5

## 2023-01-03 MED ORDER — SODIUM CHLORIDE 0.9 % IV SOLN: INTRAVENOUS | Status: DC

## 2023-01-03 MED ORDER — HEPARIN SOD (PORK) LOCK FLUSH 100 UNIT/ML IV SOLN: 500.0000 [IU] | Freq: Once | INTRAVENOUS | Status: DC | PRN

## 2023-01-03 MED ORDER — SODIUM CHLORIDE 0.9 % IV SOLN
10.0000 mg | Freq: Once | INTRAVENOUS | Status: AC
  Administered 2023-01-03: 10 mg via INTRAVENOUS
  Filled 2023-01-03: qty 10

## 2023-01-03 MED ORDER — SODIUM CHLORIDE 0.9 % IV SOLN
100.0000 mg/m2 | Freq: Once | INTRAVENOUS | Status: AC
  Administered 2023-01-03: 220 mg via INTRAVENOUS
  Filled 2023-01-03: qty 11

## 2023-01-03 MED ORDER — SODIUM CHLORIDE 0.9% FLUSH: 10.0000 mL | INTRAVENOUS | Status: DC | PRN

## 2023-01-03 MED ORDER — SODIUM CHLORIDE 0.9 % IV SOLN
400.0000 mg/m2 | Freq: Once | INTRAVENOUS | Status: AC
  Administered 2023-01-03: 896 mg via INTRAVENOUS
  Filled 2023-01-03: qty 44.8

## 2023-01-03 MED ORDER — SODIUM CHLORIDE 0.9 % IV SOLN
2400.0000 mg/m2 | INTRAVENOUS | Status: DC
  Administered 2023-01-03: 5000 mg via INTRAVENOUS
  Filled 2023-01-03: qty 100

## 2023-01-03 MED ORDER — ATROPINE SULFATE 1 MG/ML IV SOLN
0.5000 mg | Freq: Once | INTRAVENOUS | Status: AC | PRN
  Administered 2023-01-03: 0.5 mg via INTRAVENOUS
  Filled 2023-01-03: qty 1

## 2023-01-03 NOTE — Progress Notes (Signed)
Pt. Here for port flush with labs, and urine sample per the order.  Pt. States the urine was discontinued.  Secure chatted Dr. Mosetta Putt. Olegario Shearer, and Clayborn Heron.  Ok to disregard urine for today.  Order changed to Q 6 weeks per Dr. Mosetta Putt

## 2023-01-03 NOTE — Patient Instructions (Signed)
[facility]  Discharge Instructions: Thank you for choosing [facility] to provide your oncology and hematology care.   If you have a lab appointment with the Cancer Center, please go directly to the Cancer Center and check in at the registration area.   Wear comfortable clothing and clothing appropriate for easy access to any Portacath or PICC line.   We strive to give you quality time with your provider. You may need to reschedule your appointment if you arrive late (15 or more minutes).  Arriving late affects you and other patients whose appointments are after yours.  Also, if you miss three or more appointments without notifying the office, you may be dismissed from the clinic at the provider's discretion.      For prescription refill requests, have your pharmacy contact our office and allow 72 hours for refills to be completed.    Today you received the following chemotherapy and/or immunotherapy agents: Irinotecan, Leucovorin, 5FU      To help prevent nausea and vomiting after your treatment, we encourage you to take your nausea medication as directed.  BELOW ARE SYMPTOMS THAT SHOULD BE REPORTED IMMEDIATELY: *FEVER GREATER THAN 100.4 F (38 C) OR HIGHER *CHILLS OR SWEATING *NAUSEA AND VOMITING THAT IS NOT CONTROLLED WITH YOUR NAUSEA MEDICATION *UNUSUAL SHORTNESS OF BREATH *UNUSUAL BRUISING OR BLEEDING *URINARY PROBLEMS (pain or burning when urinating, or frequent urination) *BOWEL PROBLEMS (unusual diarrhea, constipation, pain near the anus) TENDERNESS IN MOUTH AND THROAT WITH OR WITHOUT PRESENCE OF ULCERS (sore throat, sores in mouth, or a toothache) UNUSUAL RASH, SWELLING OR PAIN  UNUSUAL VAGINAL DISCHARGE OR ITCHING   Items with * indicate a potential emergency and should be followed up as soon as possible or go to the Emergency Department if any problems should occur.  Please show the CHEMOTHERAPY ALERT CARD or IMMUNOTHERAPY  ALERT CARD at check-in to the Emergency Department and triage nurse.  Should you have questions after your visit or need to cancel or reschedule your appointment, please contact [facility]  Dept: [phone]  and follow the prompts.  Office hours are 8:00 a.m. to 4:30 p.m. Monday - Friday. Please note that voicemails left after 4:00 p.m. may not be returned until the following business day.  We are closed weekends and major holidays. You have access to a nurse at all times for urgent questions. Please call the main number to the clinic Dept: [phone] and follow the prompts.   For any non-urgent questions, you may also contact your provider using MyChart. We now offer e-Visits for anyone 33 and older to request care online for non-urgent symptoms. For details visit [email or web address].   Also download the MyChart app! Go to the app store, search "MyChart", open the app, select [place], and log in with your MyChart username and password.

## 2023-01-03 NOTE — Addendum Note (Signed)
Addended by: [name] on: 01/03/2023 01:23 PM   Modules accepted: Orders

## 2023-01-05 ENCOUNTER — Inpatient Hospital Stay: Payer: Medicare Other

## 2023-01-05 VITALS — BP 138/59 | HR 64 | Temp 98.0°F | Resp 16

## 2023-01-05 DIAGNOSIS — Z803 Family history of malignant neoplasm of breast: Secondary | ICD-10-CM | POA: Diagnosis not present

## 2023-01-05 DIAGNOSIS — I10 Essential (primary) hypertension: Secondary | ICD-10-CM | POA: Diagnosis not present

## 2023-01-05 DIAGNOSIS — Z86711 Personal history of pulmonary embolism: Secondary | ICD-10-CM | POA: Diagnosis not present

## 2023-01-05 DIAGNOSIS — R918 Other nonspecific abnormal finding of lung field: Secondary | ICD-10-CM | POA: Diagnosis not present

## 2023-01-05 DIAGNOSIS — Z5189 Encounter for other specified aftercare: Secondary | ICD-10-CM | POA: Diagnosis not present

## 2023-01-05 DIAGNOSIS — C779 Secondary and unspecified malignant neoplasm of lymph node, unspecified: Secondary | ICD-10-CM | POA: Diagnosis not present

## 2023-01-05 DIAGNOSIS — Z7901 Long term (current) use of anticoagulants: Secondary | ICD-10-CM | POA: Diagnosis not present

## 2023-01-05 DIAGNOSIS — E1142 Type 2 diabetes mellitus with diabetic polyneuropathy: Secondary | ICD-10-CM | POA: Diagnosis not present

## 2023-01-05 DIAGNOSIS — G620 Drug-induced polyneuropathy: Secondary | ICD-10-CM | POA: Diagnosis not present

## 2023-01-05 DIAGNOSIS — Z5111 Encounter for antineoplastic chemotherapy: Secondary | ICD-10-CM | POA: Diagnosis not present

## 2023-01-05 DIAGNOSIS — Z800 Family history of malignant neoplasm of digestive organs: Secondary | ICD-10-CM | POA: Diagnosis not present

## 2023-01-05 DIAGNOSIS — C787 Secondary malignant neoplasm of liver and intrahepatic bile duct: Secondary | ICD-10-CM | POA: Diagnosis not present

## 2023-01-05 DIAGNOSIS — C180 Malignant neoplasm of cecum: Secondary | ICD-10-CM | POA: Diagnosis not present

## 2023-01-05 DIAGNOSIS — C7963 Secondary malignant neoplasm of bilateral ovaries: Secondary | ICD-10-CM | POA: Diagnosis not present

## 2023-01-05 DIAGNOSIS — C182 Malignant neoplasm of ascending colon: Secondary | ICD-10-CM | POA: Diagnosis not present

## 2023-01-05 DIAGNOSIS — Z8049 Family history of malignant neoplasm of other genital organs: Secondary | ICD-10-CM | POA: Diagnosis not present

## 2023-01-05 DIAGNOSIS — Z90710 Acquired absence of both cervix and uterus: Secondary | ICD-10-CM | POA: Diagnosis not present

## 2023-01-05 DIAGNOSIS — Z86000 Personal history of in-situ neoplasm of breast: Secondary | ICD-10-CM | POA: Diagnosis not present

## 2023-01-05 MED ORDER — PEGFILGRASTIM-CBQV 6 MG/0.6ML ~~LOC~~ SOSY
6.0000 mg | PREFILLED_SYRINGE | Freq: Once | SUBCUTANEOUS | Status: AC
  Administered 2023-01-05: 6 mg via SUBCUTANEOUS
  Filled 2023-01-05: qty 0.6

## 2023-01-05 MED ORDER — SODIUM CHLORIDE 0.9% FLUSH
10.0000 mL | INTRAVENOUS | Status: DC | PRN
  Administered 2023-01-05: 10 mL

## 2023-01-05 MED ORDER — HEPARIN SOD (PORK) LOCK FLUSH 100 UNIT/ML IV SOLN
500.0000 [IU] | Freq: Once | INTRAVENOUS | Status: AC | PRN
  Administered 2023-01-05: 500 [IU]

## 2023-01-13 MED FILL — Dexamethasone Sodium Phosphate Inj 100 MG/10ML: INTRAMUSCULAR | Qty: 1 | Status: AC

## 2023-01-13 NOTE — Progress Notes (Unsigned)
[facility]   [phone] [phone]   Clinic Follow up Note   Patient Care Team: [name], MD as PCP - General (Family Medicine) [name], MD as Consulting Physician (General Surgery) [name], MD as Consulting Physician (Hematology) [name], MD as Consulting Physician (General Surgery) [name], MD as Consulting Physician (Hematology and Oncology)  Date of Service:  01/16/2023  CHIEF COMPLAINT: f/u of metastatic colon cancer     CURRENT THERAPY:  FOLFIRI/Beva q14 days, starting 04/06/22. Beva on hold 3-6 months stopped due to PE on restaging CT 10/2022. Beva restarted 01/17/23  ASSESSMENT:  Kim Castro is a 77 y.o. female with   Malignant neoplasm of ascending colon (HCC) pT3N2aM0 stage IIB, MSS, metastatic to b/l ovaries and peritoneum and liver, KRAS mutation G12V (+)  -Initially diagnosed in 08/2019, s/p resection with Dr Maisie Fus on 11/01/19. Path showed overall Stage IIIB cancer.  -s/p 6 months adjuvant FOLFOX, completed 06/01/20, oxaliplatin held for final 2 cycles due to neuropathy.  -s/p BSO and hysterectomy on 12/22/21 with Dr. Pricilla Holm. Path revealed metastatic moderately differentiated colonic adenocarcinoma involving both ovaries and peritoneum -she restarted FOLFOX on 01/24/22. -Due to disease progression, chemotherapy changed to second line FOLFIRI and bevacizumab on April 06, 2022. She has been tolerating moderately well, better now with dose reduction, will continue -Restaging CT scan from 09/27/2022 showed continous response in liver and peritoneal metastasis, no other new lesions.   -We discussed maintenance therapy option in future  -due to her PE in 10/2022, will hold beva for 3 months  -restaging CT 12/31/2022 showed stable disease and live met is not well defined on CT. She restarted beva on 01/17/23   Malignant neoplasm of female breast [facility]) -Dx in 05/2014, s/p left lumpectomy with Dr Johna Sheriff,  adjuvant RT with Dr [name], and Anastrozole 08/2014 - 01/13/20 under Dr Pamelia Hoit.  -DEXA 09/11/20 T score +1.2 normal.  -most recent mammogram on 10/31/2022 was negative.     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  Overall stable.    Pulmonary embolism (HCC) -incidental findings of bilateral distal PEs on restaging CT 10/02/2022, pt was asymptomatic -doppler of LEs (-) DVT -continue Eliquis indefinitely        PLAN: -lab reviewed -CMP-pending -proceed with Folfiri/Avastin today -I refill pregabalin 50 mg  -lab/flush and f/u in 9/30  SUMMARY OF ONCOLOGIC HISTORY: Oncology History Overview Note   Cancer Staging  Malignant neoplasm of female breast [facility]) Staging form: Breast, AJCC 7th Edition - Clinical stage from 06/11/2014: Stage 0 (Tis (DCIS), N0, M0) - Unsigned Staged by: Pathologist and managing physician Laterality: Left Estrogen receptor status: Positive Progesterone receptor status: Positive Stage used in treatment planning: Yes National guidelines used in treatment planning: Yes Type of national guideline used in treatment planning: NCCN - Pathologic stage from 07/03/2014: Stage Unknown (Tis (DCIS), NX, cM0) - Signed by [name], MD on 07/10/2014 Staged by: Pathologist Laterality: Left Estrogen receptor status: Positive Progesterone receptor status: Positive Stage used in treatment planning: Yes National guidelines used in treatment planning: Yes Type of national guideline used in treatment planning: NCCN Staging comments: Staged on final lumpectomy specimen by Dr. [name].  Right colon cancer Staging form: Colon and Rectum, AJCC 8th Edition - Pathologic stage from 11/01/2019: Stage IIIB (pT3, pN2a, cM0) - Signed by [name], MD on 11/29/2019 Stage prefix: Initial diagnosis Histologic grading system: 4 grade system Histologic grade (  G): G2 Residual tumor (R): R0 - None Tumor  deposits (TD): Absent Perineural invasion (PNI): Absent Microsatellite instability (MSI): Stable KRAS mutation: Unknown NRAS mutation: Unknown BRAF mutation: Unknown     Malignant neoplasm of female breast (HCC)  05/29/2014 Initial Biopsy   Left breast needle core biopsy: Grade 2, DCIS with calcs. ER+ (100%), PR+ (96%).    06/04/2014 Initial Diagnosis   Left breast DCIS with calcifications, ER 100%, PR 96%   06/10/2014 Breast MRI   Left breast: 2.4 x 1.3 x 1.1 cm area of patchy non-mass enhancement upper outer quadrant includes postbiopsy seroma; Right breast: 1.2 cm previously biopsied stable benign fibroadenoma   06/12/2014 Procedure   Genetic counseling/testing: Identified 1 VUS on CHEK2 gene. Remainder of 17 gene panel tested negative and included: ATM, BARD1, BRCA 1/2, BRIP1, CDH1, CHEK2, EPCAM, MLH1, MSH2, MSH6, NBN, NF1, PALB2, PTEN, RAD50, RAD51C, RAD51D, STK11, and TP53.    07/01/2014 Surgery   Left breast lumpectomy ([facility]): Grade 1, DCIS, spanning 2.3 cm, 1 mm margin, ER 100%, PR 96%   07/31/2014 - 08/28/2014 Radiation Therapy   Adjuvant RT completed [name]). Left breast: Total dose 42.5 Gy over 17 fractions. Left breast boost: Total dose 7.5 Gy over 3 fractions.    09/14/2014 - 01/13/2020 Anti-estrogen oral therapy   Anastrazole 1mg  daily. Planned duration of treatment: 5 years [place]). Completed in 01/2020.    09/25/2014 Survivorship   Survivorship Care Plan given to patient and reviewed with her in person.    03/02/2021 Imaging   CT CAP  IMPRESSION: 1. No findings of active/recurrent malignancy. Partial right hemicolectomy. 2. Endometrial stripe remains mildly thickened, but endometrial biopsy in May was negative for malignancy. 3. Progressive endplate sclerosis and endplate irregularity at T2-3, probably due to degenerative endplate findings. If the has referable upper thoracic pain/symptoms then thoracic spine MRI could be used for further workup. 4. Other  imaging findings of potential clinical significance: Mild cardiomegaly. Aortic Atherosclerosis (ICD10-I70.0). Mild mitral valve calcification. Postoperative findings in the left breast with adjacent radiation port anteriorly in the left upper lobe. Tiny pulmonary nodules in the left lower lobe are unchanged from earliest available comparison of 10/17/2019 and probably benign although may merit surveillance. Multilevel lumbar impingement. Mild pelvic floor laxity.   Malignant neoplasm of ascending colon (HCC)  09/19/2019 Imaging   CT AP W contrast 09/19/19  IMPRESSION Fullness in the cecum, cannot exclude a mass. No evidence for metastatic disease is identified.    09/24/2019 Procedure   Colonoscopy by Dr [name] 09/24/19 IMPRESSION 1. The colon was redundant  2. Mild diverticulosis was noted through the entire examined colon 3. Single 12mm polyp was found in the ascending colon; polypectomy was performed using snare cautery and biopsy forceps 4. Mild diverticulosis was notes in the descending colon and sigmoid colon.  5. Single polyp was found in the sigmoid colon, polypectomy was performed with cold forceps.  6. Single polyp was found in the rectosigmoid colon; polypectomy was performed with cold snare  7. Small internal hemorrhoids  8. Large mass was found at the cecum; multiple biopsies of the area were performed using cold forceps; injection (tattooing) was performed distal to the mass.    09/24/2019 Initial Biopsy   INTERPRETATION AND DIAGNOSIS:  A. Cecum, biopsy:  Invasive moderately differentiated adenocarcinoma.  see comment  B. Polyp @ ascending colon, polypectomy:  Tubular Adenoma  C. Polyp @ sigmoid colon Polypectomy:  hyperplastic polyp.  D. Polyp @ rectosigmoid colon, Polypectomy:  Hyperplastic Polyp  10/16/2019 Imaging   CT Chest IMPRESSION: 1. Multiple small pulmonary nodules measuring 5 mm or less in size in the lungs. These are nonspecific and are typically  considered statistically likely benign. However, given the patient's history of primary malignancy, close attention on follow-up studies is recommended to ensure stability. 2. Aortic atherosclerosis, in addition to right coronary artery disease. Assessment for potential risk factor modification, dietary therapy or pharmacologic therapy may be warranted, if clinically indicated. 3. There are calcifications of the aortic valve and mitral annulus. Echocardiographic correlation for evaluation of potential valvular dysfunction may be warranted if clinically indicated. 4. Small hiatal hernia.   Aortic Atherosclerosis (ICD10-I70.0).   11/01/2019 Initial Diagnosis   Colon cancer (HCC)   11/01/2019 Surgery   LAPAROSCOPIC PARTIAL COLECTOMY by Dr Maisie Fus and Dr Michaell Cowing   11/01/2019 Pathology Results   FINAL MICROSCOPIC DIAGNOSIS:   A. COLON, PROXIMAL RIGHT, COLECTOMY:  - Invasive colonic adenocarcinoma, 5 cm.  - Tumor invades through the muscularis propria into pericolonic tissues.   - Margins of resection are not involved.  - Metastatic carcinoma in (5) of (13) lymph nodes.  - See oncology table.    MSI Stable  Mismatch repair normal  MLH1 - Preserved nuclear expression (greater 50% tumor expression) MSH2 - Preserved nuclear expression (greater 50% tumor expression) MSH6 - Preserved nuclear expression (greater 50% tumor expression) PMS2 - Preserved nuclear expression (greater 50% tumor expression)   11/01/2019 Cancer Staging   Staging form: Colon and Rectum, AJCC 8th Edition - Pathologic stage from 11/01/2019: Stage IIIB (pT3, pN2a, cM0) - Signed by [name], MD on 11/29/2019   12/10/2019 Procedure   PAC placed 12/10/19   12/17/2019 - 06/01/2020 Chemotherapy   FOLFOX q2weeks starting in 2 weeks starting 12/17/19. Held 01/27/20-02/10/20 due to b/l PE. Oxaliplatin held C11-12 due to neuropathy. Completed on 06/01/20   03/02/2021 Imaging   CT CAP  IMPRESSION: 1. No findings of active/recurrent  malignancy. Partial right hemicolectomy. 2. Endometrial stripe remains mildly thickened, but endometrial biopsy in May was negative for malignancy. 3. Progressive endplate sclerosis and endplate irregularity at T2-3, probably due to degenerative endplate findings. If the has referable upper thoracic pain/symptoms then thoracic spine MRI could be used for further workup. 4. Other imaging findings of potential clinical significance: Mild cardiomegaly. Aortic Atherosclerosis (ICD10-I70.0). Mild mitral valve calcification. Postoperative findings in the left breast with adjacent radiation port anteriorly in the left upper lobe. Tiny pulmonary nodules in the left lower lobe are unchanged from earliest available comparison of 10/17/2019 and probably benign although may merit surveillance. Multilevel lumbar impingement. Mild pelvic floor laxity.   08/26/2021 Imaging   EXAM: CT CHEST, ABDOMEN, AND PELVIS WITH CONTRAST  IMPRESSION: 1. Stable examination without new or progressive findings to suggest local recurrence or metastatic disease within the chest, abdomen, or pelvis. 2. Hepatomegaly with hepatic steatosis. 3. Sigmoid colonic diverticulosis without findings of acute diverticulitis. 4. Similar prominent endplate sclerosis and irregularity at T2-T3 is most consistent with Modic type endplate degenerative changes. However, if patient has referable upper thoracic pain consider further workup with thoracic spine MRI. 5. Similar thickening of the endometrial stripe measuring up to 8 mm, which was previously biopsied with results negative for malignancy. 6. Aortic Atherosclerosis (ICD10-I70.0).   11/10/2021 PET scan   IMPRESSION: 1. LEFT ovary is increased in size and is intensely hypermetabolic. While physiologic hypermetabolic ovarian tissue is not uncommon, the enlargement and asymmetric activity warrants further evaluation. Consider contrast pelvic MRI vs tissue sampling. 2. No evidence of  metastatic colorectal carcinoma otherwise. 3. Post RIGHT hemicolectomy anatomy. 4. Evidence of radiation change in the LEFT upper lobe (remote breast cancer).     12/22/2021 Relapse/Recurrence    FINAL MICROSCOPIC DIAGNOSIS:   A. LEFT OVARY AND FALLOPIAN TUBE, SALPINGO OOPHORECTOMY:  - Metastatic moderately differentiated colonic adenocarcinoma involving left ovary  - Focal ovarian stromal calcification  - Segment of benign fallopian tube   B. UTERUS WITH RIGHT FALLOPIAN TUBE AND OVARY, HYSTERECTOMY AND SALPINGO-OOPHORECTOMY:  - Metastatic moderately differentiated colonic adenocarcinoma involving right ovary  - Focal invasive extensive adenomyosis  - Benign endometrial polyps  - Benign proliferative phase endometrium  - Hydrosalpinx of right fallopian tube  - Focal ovarian stromal calcification   C. PERITONEAL DEPOSITS, ANTERIOR CUL DE SAC, BIOPSY:  - Metastatic mucinous adenocarcinoma, consistent with colorectal primary    COMMENT:  Immunohistochemical stains show that the tumor cells are positive for CK20 and CDX2 while they are negative for CK7 and PAX8, consistent with above interpretation.    01/24/2022 - 03/23/2022 Chemotherapy   Patient is on Treatment Plan : COLORECTAL FOLFOX q14d x 3 months     04/01/2022 Imaging   CT AP IMPRESSION: 1. New omental metastatic disease. 2. Vague hypoattenuating lesion in the inferior right hepatic lobe is new and also worrisome for metastatic disease. 3. Similar small right lower lobe nodules. Recommend continued attention on follow-up. 4. Steatotic enlarged liver. 5.  Aortic atherosclerosis (ICD10-I70.0).   04/06/2022 -  Chemotherapy   Patient is on Treatment Plan : COLORECTAL FOLFIRI + Bevacizumab q14d     04/21/2022 Imaging    IMPRESSION: 1. Stable chest CT. No evidence of metastatic disease. 2. Stable small pulmonary nodules, considered benign based on stability. 3. Stable postsurgical changes in the left breast and anterior  left upper lobe radiation changes. 4. Aortic Atherosclerosis (ICD10-I70.0) and Emphysema (ICD10-J43.9).   04/21/2022 Imaging    IMPRESSION: 1. Small enhancing lesion inferiorly in the right hepatic lobe is typical of metastatic disease and stable from recent abdominal CT. No other definite liver lesions are identified. Mild hepatic steatosis. 2. No other significant abdominal findings. 3. Omental nodularity seen on CT is not well visualized by MRI.    Imaging     06/23/2022 Imaging    IMPRESSION: 1. Hypodense lesion of the inferior right lobe of the liver, hepatic segment VI is diminished in size, consistent with treatment response. 2. Tiny peritoneal and omental nodules identified by prior examination are diminished in size, consistent with treatment response. 3. Multiple tiny pulmonary nodules unchanged, most likely benign and incidental, however continued attention on follow-up warranted in the setting of known metastatic disease. 4. No evidence of new metastatic disease in the chest, abdomen, or pelvis. 5. Status post partial right hemicolectomy and reanastomosis. 6. Diffuse mosaic attenuation of the airspaces, consistent with small airways disease. 7. Hepatomegaly.      INTERVAL HISTORY:  Kim Castro is here for a follow up of metastatic colon cancer . She was last seen by NP Lacie on 01/03/2023. She presents to the clinic alone. Pt state that she is about the same. She reports of still having diarrhea intermittent and she also takes Imodium for the symptoms.    All other systems were reviewed with the patient and are negative.  MEDICAL HISTORY:  Past Medical History:  Diagnosis Date   Aortic atherosclerosis (HCC)    Arthritis    feet, lower back   Basal cell carcinoma    arm   Breast cancer of upper-outer  quadrant of left female breast (HCC) 06/04/2014   Cataract    immature on the left   Colon cancer (HCC) 08/2019   Diabetes mellitus without  complication (HCC)    Diverticulosis    Dizziness    > 12yrs ago;took Antivert    Family history of anesthesia complication    sister slow to wake up with anesthesia   Family history of breast cancer    Family history of colon cancer    Family history of uterine cancer    GERD (gastroesophageal reflux disease)    takes occasional TUMs   History of bronchitis    > 22yrs ago   History of colon polyps    History of hiatal hernia    Small noted on CT   History of pulmonary embolus (PE)    Hypertension    takes Losartan daily and HCTZ   Iron deficiency anemia    Joint pain    Numbness    to toes on each foot   Peripheral neuropathy    feet and toes   Personal history of radiation therapy    Pulmonary nodules    Noted on CT   Radiation 07/31/14-08/28/14   Left Breast 20 fxs   Seasonal allergies    takes Claritin prn   Urinary frequency    Vitamin D deficiency    takes VIt D daily    SURGICAL HISTORY: Past Surgical History:  Procedure Laterality Date   BREAST BIOPSY Bilateral    BREAST LUMPECTOMY Left    BREAST LUMPECTOMY WITH RADIOACTIVE SEED LOCALIZATION Left 07/01/2014   Procedure: LEFT BREAST LUMPECTOMY WITH RADIOACTIVE SEED LOCALIZATION;  Surgeon: Glenna Fellows, MD;  Location: [facility];  Service: General;  Laterality: Left;   CATARACT EXTRACTION Right    COLONOSCOPY  09/24/2019   [place]   LAPAROSCOPIC PARTIAL COLECTOMY N/A 11/01/2019   Procedure: LAPAROSCOPIC PARTIAL COLECTOMY;  Surgeon: [name], MD;  Location: [facility];  Service: General;  Laterality: N/A;   POLYPECTOMY     PORTACATH PLACEMENT N/A 12/10/2019   Procedure: INSERTION PORT-A-CATH ULTRASOUND GUIDED IN RIGHT IJ;  Surgeon: [name], MD;  Location: [facility];  Service: General;  Laterality: N/A;   ROBOTIC ASSISTED TOTAL HYSTERECTOMY Bilateral 12/22/2021   Procedure: XI ROBOTIC ASSISTED TOTAL HYSTERECTOMY WITH BILATERAL SALPINGO OOPHORECTOMY;  Surgeon: Carver Fila, MD;   Location: [facility];  Service: Gynecology;  Laterality: Bilateral;   TOTAL KNEE ARTHROPLASTY Left 10/24/2012   Procedure: TOTAL KNEE ARTHROPLASTY;  Surgeon: Nestor Lewandowsky, MD;  Location: [facility];  Service: Orthopedics;  Laterality: Left;   TOTAL KNEE ARTHROPLASTY Right 01/09/2013   Procedure: TOTAL KNEE ARTHROPLASTY;  Surgeon: Nestor Lewandowsky, MD;  Location: [facility];  Service: Orthopedics;  Laterality: Right;   TUBAL LIGATION      I have reviewed the social history and family history with the patient and they are unchanged from previous note.  ALLERGIES:  is allergic to oxycodone.  MEDICATIONS:  Current Outpatient Medications  Medication Sig Dispense Refill   apixaban (ELIQUIS) 5 MG TABS tablet Take 1 tablet (5 mg total) by mouth 2 (two) times daily. 60 tablet 2   b complex vitamins capsule Take 1 capsule by mouth daily.     Cholecalciferol (VITAMIN D) 2000 UNITS CAPS Take 2,000 Units by mouth daily.      ibuprofen (ADVIL) 200 MG tablet Take 200 mg by mouth daily as needed (pain).     lidocaine-prilocaine (EMLA) cream Apply 1 Application topically as needed.  30 g 0   loratadine (CLARITIN) 10 MG tablet Take 10 mg by mouth daily as needed for allergies.     losartan (COZAAR) 25 MG tablet Take 1 tablet (25 mg total) by mouth daily. 90 tablet 3   metFORMIN (GLUCOPHAGE) 500 MG tablet Take 1 tablet (500 mg total) by mouth in the morning and at bedtime. 180 tablet 3   Multiple Vitamins-Minerals (MULTIVITAMIN WITH MINERALS) tablet Take 1 tablet by mouth daily.     omeprazole (PRILOSEC) 20 MG capsule Take 20 mg by mouth daily before breakfast.      ondansetron (ZOFRAN) 8 MG tablet Take 1 tablet (8 mg total) by mouth every 8 (eight) hours as needed for nausea or vomiting. Start on the third day after chemotherapy. 30 tablet 1   potassium chloride (KLOR-CON) 10 MEQ tablet Take 1 tablet (10 mEq total) by mouth daily. 30 tablet 0   pravastatin (PRAVACHOL) 10 MG tablet Take 1 tablet (10 mg total) by mouth daily.  90 tablet 3   pregabalin (LYRICA) 25 MG capsule Take 1 capsule (25 mg total) by mouth every morning. Take in addition to 50 mg at bedtime 60 capsule 2   pregabalin (LYRICA) 50 MG capsule Take 1 capsule (50 mg total) by mouth at bedtime. Take in addition to 25 mg in the morning 60 capsule 2   prochlorperazine (COMPAZINE) 10 MG tablet Take 1 tablet (10 mg total) by mouth every 6 (six) hours as needed for nausea or vomiting. 30 tablet 1   triamcinolone ointment (KENALOG) 0.5 % Apply 1 Application topically 2 (two) times daily. 30 g 0   No current facility-administered medications for this visit.   Facility-Administered Medications Ordered in Other Visits  Medication Dose Route Frequency Provider Last Rate Last Admin   fluorouracil (ADRUCIL) 5,000 mg in sodium chloride 0.9 % 150 mL chemo infusion  2,400 mg/m2 (Treatment Plan Recorded) Intravenous 1 day or 1 dose [name], MD       irinotecan (CAMPTOSAR) 220 mg in sodium chloride 0.9 % 500 mL chemo infusion  100 mg/m2 (Treatment Plan Recorded) Intravenous Once [name], MD 341 mL/hr at 01/16/23 1107 220 mg at 01/16/23 1107   leucovorin 896 mg in sodium chloride 0.9 % 250 mL infusion  400 mg/m2 (Treatment Plan Recorded) Intravenous Once [name], MD 197 mL/hr at 01/16/23 1106 896 mg at 01/16/23 1106   sodium chloride flush (NS) 0.9 % injection 10 mL  10 mL Intracatheter PRN [name], MD   10 mL at 01/16/23 0857    PHYSICAL EXAMINATION: ECOG PERFORMANCE STATUS: 1 - Symptomatic but completely ambulatory  Vitals:   01/16/23 0920  BP: (!) 141/67  Pulse: 66  Resp: 18  Temp: 98.1 F (36.7 C)  SpO2: 100%   Wt Readings from Last 3 Encounters:  01/16/23 212 lb 14.4 oz (96.6 kg)  01/03/23 217 lb 7 oz (98.6 kg)  12/19/22 212 lb 11.2 oz (96.5 kg)     GENERAL:alert, no distress and comfortable SKIN: skin color normal, no rashes or significant lesions EYES: normal, Conjunctiva are pink and non-injected, sclera clear  NEURO: alert & oriented x 3  with fluent speech  LABORATORY DATA:  I have reviewed the data as listed    Latest Ref Rng & Units 01/16/2023    8:51 AM 01/03/2023    7:36 AM 12/19/2022    7:26 AM  CBC  WBC 4.0 - 10.5 K/uL 9.1  15.4  17.0   Hemoglobin 12.0 - 15.0 g/dL  10.4  9.9  10.3   Hematocrit 36.0 - 46.0 % 33.2  31.2  32.7   Platelets 150 - 400 K/uL 220  210  233         Latest Ref Rng & Units 01/16/2023    8:51 AM 01/03/2023    7:36 AM 12/19/2022    7:26 AM  CMP  Glucose 70 - 99 mg/dL 97  782  956   BUN 8 - 23 mg/dL 12  14  19    Creatinine 0.44 - 1.00 mg/dL 2.13  0.86  5.78   Sodium 135 - 145 mmol/L 145  144  143   Potassium 3.5 - 5.1 mmol/L 3.8  3.6  3.6   Chloride 98 - 111 mmol/L 111  112  110   CO2 22 - 32 mmol/L 28  26  24    Calcium 8.9 - 10.3 mg/dL 9.2  8.8  9.1   Total Protein 6.5 - 8.1 g/dL 6.1  6.0  6.5   Total Bilirubin 0.3 - 1.2 mg/dL 0.9  0.8  0.7   Alkaline Phos 38 - 126 U/L 128  122  142   AST 15 - 41 U/L 16  21  20    ALT 0 - 44 U/L 12  16  14        RADIOGRAPHIC STUDIES: I have personally reviewed the radiological images as listed and agreed with the findings in the report. No results found.    Orders Placed This Encounter  Procedures   CBC with Differential (Cancer Center Only)    Standing Status:   Future    Standing Expiration Date:   03/13/2024   CMP (Cancer Center only)    Standing Status:   Future    Standing Expiration Date:   03/13/2024   CBC with Differential (Cancer Center Only)    Standing Status:   Future    Standing Expiration Date:   03/27/2024   CMP (Cancer Center only)    Standing Status:   Future    Standing Expiration Date:   03/27/2024   All questions were answered. The patient knows to call the clinic with any problems, questions or concerns. No barriers to learning was detected. The total time spent in the appointment was 30 minutes.     [name], MD 01/16/2023   Carolin Coy, CMA, am acting as scribe for [name], MD.   I have reviewed the  above documentation for accuracy and completeness, and I agree with the above.

## 2023-01-15 NOTE — Assessment & Plan Note (Signed)
-  incidental findings of bilateral distal PEs on restaging CT 10/02/2022, pt was asymptomatic -doppler of LEs (-) DVT -continue Eliquis indefinitely

## 2023-01-15 NOTE — Assessment & Plan Note (Signed)
-  Secondary to oxaliplatin -She is on Lyrica and B complex, continue to monitor.  Overall stable.

## 2023-01-15 NOTE — Assessment & Plan Note (Signed)
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 Path showed overall Stage IIIB cancer.  -s/p 6 months adjuvant FOLFOX, completed 06/01/20, oxaliplatin held for final 2 cycles due to neuropathy.  -s/p BSO and hysterectomy on 12/22/21 with Dr. Pricilla Holm. Path revealed metastatic moderately differentiated colonic adenocarcinoma involving both ovaries and peritoneum -she restarted FOLFOX on 01/24/22. -Due to disease progression, chemotherapy changed to second line FOLFIRI and bevacizumab on April 06, 2022. She has been tolerating moderately well, better now with dose reduction, will continue -Restaging CT scan from 09/27/2022 showed continous response in liver and peritoneal metastasis, no other new lesions.   -We discussed maintenance therapy option in future  -due to her PE in 10/2022, will hold beva for 3 months  -restaging CT 12/31/2022 showed stable disease and live met is not well defined on CT. She restarted beva on 01/17/23

## 2023-01-15 NOTE — Assessment & Plan Note (Signed)
-  Dx in 05/2014, s/p left lumpectomy with Dr Johna Sheriff, adjuvant RT with Dr [name], and Anastrozole 08/2014 - 01/13/20 under Dr Pamelia Hoit.  -DEXA 09/11/20 T score +1.2 normal.  -most recent mammogram on 10/31/2022 was negative.

## 2023-01-16 ENCOUNTER — Encounter: Payer: Self-pay | Admitting: Hematology

## 2023-01-16 ENCOUNTER — Inpatient Hospital Stay: Payer: Medicare Other

## 2023-01-16 ENCOUNTER — Inpatient Hospital Stay: Payer: Medicare Other | Admitting: Hematology

## 2023-01-16 VITALS — BP 141/67 | HR 66 | Temp 98.1°F | Resp 18 | Ht 65.5 in | Wt 212.9 lb

## 2023-01-16 DIAGNOSIS — C182 Malignant neoplasm of ascending colon: Secondary | ICD-10-CM

## 2023-01-16 DIAGNOSIS — E1142 Type 2 diabetes mellitus with diabetic polyneuropathy: Secondary | ICD-10-CM | POA: Diagnosis not present

## 2023-01-16 DIAGNOSIS — G620 Drug-induced polyneuropathy: Secondary | ICD-10-CM | POA: Diagnosis not present

## 2023-01-16 DIAGNOSIS — C787 Secondary malignant neoplasm of liver and intrahepatic bile duct: Secondary | ICD-10-CM | POA: Diagnosis not present

## 2023-01-16 DIAGNOSIS — C50919 Malignant neoplasm of unspecified site of unspecified female breast: Secondary | ICD-10-CM | POA: Diagnosis not present

## 2023-01-16 DIAGNOSIS — Z5189 Encounter for other specified aftercare: Secondary | ICD-10-CM | POA: Diagnosis not present

## 2023-01-16 DIAGNOSIS — Z803 Family history of malignant neoplasm of breast: Secondary | ICD-10-CM | POA: Diagnosis not present

## 2023-01-16 DIAGNOSIS — I10 Essential (primary) hypertension: Secondary | ICD-10-CM | POA: Diagnosis not present

## 2023-01-16 DIAGNOSIS — Z7901 Long term (current) use of anticoagulants: Secondary | ICD-10-CM | POA: Diagnosis not present

## 2023-01-16 DIAGNOSIS — Z8049 Family history of malignant neoplasm of other genital organs: Secondary | ICD-10-CM | POA: Diagnosis not present

## 2023-01-16 DIAGNOSIS — T451X5A Adverse effect of antineoplastic and immunosuppressive drugs, initial encounter: Secondary | ICD-10-CM

## 2023-01-16 DIAGNOSIS — Z86000 Personal history of in-situ neoplasm of breast: Secondary | ICD-10-CM | POA: Diagnosis not present

## 2023-01-16 DIAGNOSIS — Z86711 Personal history of pulmonary embolism: Secondary | ICD-10-CM | POA: Diagnosis not present

## 2023-01-16 DIAGNOSIS — R918 Other nonspecific abnormal finding of lung field: Secondary | ICD-10-CM | POA: Diagnosis not present

## 2023-01-16 DIAGNOSIS — Z5111 Encounter for antineoplastic chemotherapy: Secondary | ICD-10-CM | POA: Diagnosis not present

## 2023-01-16 DIAGNOSIS — Z95828 Presence of other vascular implants and grafts: Secondary | ICD-10-CM

## 2023-01-16 DIAGNOSIS — C180 Malignant neoplasm of cecum: Secondary | ICD-10-CM | POA: Diagnosis not present

## 2023-01-16 DIAGNOSIS — Z800 Family history of malignant neoplasm of digestive organs: Secondary | ICD-10-CM | POA: Diagnosis not present

## 2023-01-16 DIAGNOSIS — C7963 Secondary malignant neoplasm of bilateral ovaries: Secondary | ICD-10-CM | POA: Diagnosis not present

## 2023-01-16 DIAGNOSIS — C779 Secondary and unspecified malignant neoplasm of lymph node, unspecified: Secondary | ICD-10-CM | POA: Diagnosis not present

## 2023-01-16 DIAGNOSIS — I2694 Multiple subsegmental pulmonary emboli without acute cor pulmonale: Secondary | ICD-10-CM

## 2023-01-16 DIAGNOSIS — Z170 Estrogen receptor positive status [ER+]: Secondary | ICD-10-CM

## 2023-01-16 DIAGNOSIS — Z90710 Acquired absence of both cervix and uterus: Secondary | ICD-10-CM | POA: Diagnosis not present

## 2023-01-16 LAB — CBC WITH DIFFERENTIAL (CANCER CENTER ONLY)
Abs Immature Granulocytes: 0.14 10*3/uL — ABNORMAL HIGH (ref 0.00–0.07)
Basophils Absolute: 0.1 10*3/uL (ref 0.0–0.1)
Basophils Relative: 1 %
Eosinophils Absolute: 0.2 10*3/uL (ref 0.0–0.5)
Eosinophils Relative: 2 %
HCT: 33.2 % — ABNORMAL LOW (ref 36.0–46.0)
Hemoglobin: 10.4 g/dL — ABNORMAL LOW (ref 12.0–15.0)
Immature Granulocytes: 2 %
Lymphocytes Relative: 32 %
Lymphs Abs: 2.9 10*3/uL (ref 0.7–4.0)
MCH: 32.3 pg (ref 26.0–34.0)
MCHC: 31.3 g/dL (ref 30.0–36.0)
MCV: 103.1 fL — ABNORMAL HIGH (ref 80.0–100.0)
Monocytes Absolute: 0.7 10*3/uL (ref 0.1–1.0)
Monocytes Relative: 8 %
Neutro Abs: 5.1 10*3/uL (ref 1.7–7.7)
Neutrophils Relative %: 55 %
Platelet Count: 220 10*3/uL (ref 150–400)
RBC: 3.22 MIL/uL — ABNORMAL LOW (ref 3.87–5.11)
RDW: 16.6 % — ABNORMAL HIGH (ref 11.5–15.5)
WBC Count: 9.1 10*3/uL (ref 4.0–10.5)
nRBC: 0 % (ref 0.0–0.2)

## 2023-01-16 LAB — CMP (CANCER CENTER ONLY)
ALT: 12 U/L (ref 0–44)
AST: 16 U/L (ref 15–41)
Albumin: 3.8 g/dL (ref 3.5–5.0)
Alkaline Phosphatase: 128 U/L — ABNORMAL HIGH (ref 38–126)
Anion gap: 6 (ref 5–15)
BUN: 12 mg/dL (ref 8–23)
CO2: 28 mmol/L (ref 22–32)
Calcium: 9.2 mg/dL (ref 8.9–10.3)
Chloride: 111 mmol/L (ref 98–111)
Creatinine: 0.81 mg/dL (ref 0.44–1.00)
GFR, Estimated: >60 mL/min (ref >60)
Glucose, Bld: 97 mg/dL (ref 70–99)
Potassium: 3.8 mmol/L (ref 3.5–5.1)
Sodium: 145 mmol/L (ref 135–145)
Total Bilirubin: 0.9 mg/dL (ref 0.3–1.2)
Total Protein: 6.1 g/dL — ABNORMAL LOW (ref 6.5–8.1)

## 2023-01-16 LAB — TOTAL PROTEIN, URINE DIPSTICK: Protein, ur: NEGATIVE mg/dL

## 2023-01-16 MED ORDER — SODIUM CHLORIDE 0.9 % IV SOLN
400.0000 mg/m2 | Freq: Once | INTRAVENOUS | Status: AC
  Administered 2023-01-16: 896 mg via INTRAVENOUS
  Filled 2023-01-16: qty 44.8

## 2023-01-16 MED ORDER — SODIUM CHLORIDE 0.9 % IV SOLN
5.0000 mg/kg | Freq: Once | INTRAVENOUS | Status: AC
  Administered 2023-01-16: 500 mg via INTRAVENOUS
  Filled 2023-01-16: qty 4

## 2023-01-16 MED ORDER — SODIUM CHLORIDE 0.9 % IV SOLN
100.0000 mg/m2 | Freq: Once | INTRAVENOUS | Status: AC
  Administered 2023-01-16: 220 mg via INTRAVENOUS
  Filled 2023-01-16: qty 11

## 2023-01-16 MED ORDER — SODIUM CHLORIDE 0.9 % IV SOLN
10.0000 mg | Freq: Once | INTRAVENOUS | Status: AC
  Administered 2023-01-16: 10 mg via INTRAVENOUS
  Filled 2023-01-16: qty 10

## 2023-01-16 MED ORDER — PALONOSETRON HCL INJECTION 0.25 MG/5ML
0.2500 mg | Freq: Once | INTRAVENOUS | Status: AC
  Administered 2023-01-16: 0.25 mg via INTRAVENOUS
  Filled 2023-01-16: qty 5

## 2023-01-16 MED ORDER — SODIUM CHLORIDE 0.9 % IV SOLN
2400.0000 mg/m2 | INTRAVENOUS | Status: DC
  Administered 2023-01-16: 5000 mg via INTRAVENOUS
  Filled 2023-01-16: qty 100

## 2023-01-16 MED ORDER — ATROPINE SULFATE 1 MG/ML IV SOLN
0.5000 mg | Freq: Once | INTRAVENOUS | Status: AC | PRN
  Administered 2023-01-16: 0.5 mg via INTRAVENOUS
  Filled 2023-01-16: qty 1

## 2023-01-16 MED ORDER — SODIUM CHLORIDE 0.9% FLUSH
10.0000 mL | INTRAVENOUS | Status: DC | PRN
  Administered 2023-01-16: 10 mL

## 2023-01-16 MED ORDER — PREGABALIN 50 MG PO CAPS: 50.0000 mg | ORAL_CAPSULE | Freq: Every day | ORAL | 2 refills | Status: DC

## 2023-01-16 NOTE — Patient Instructions (Signed)
[facility]  Discharge Instructions: Thank you for choosing [facility] to provide your oncology and hematology care.   If you have a lab appointment with the Cancer Center, please go directly to the Cancer Center and check in at the registration area.   Wear comfortable clothing and clothing appropriate for easy access to any Portacath or PICC line.   We strive to give you quality time with your provider. You may need to reschedule your appointment if you arrive late (15 or more minutes).  Arriving late affects you and other patients whose appointments are after yours.  Also, if you miss three or more appointments without notifying the office, you may be dismissed from the clinic at the provider's discretion.      For prescription refill requests, have your pharmacy contact our office and allow 72 hours for refills to be completed.    Today you received the following chemotherapy and/or immunotherapy agents: Irinotecan, Leucovorin, 5FU      To help prevent nausea and vomiting after your treatment, we encourage you to take your nausea medication as directed.  BELOW ARE SYMPTOMS THAT SHOULD BE REPORTED IMMEDIATELY: *FEVER GREATER THAN 100.4 F (38 C) OR HIGHER *CHILLS OR SWEATING *NAUSEA AND VOMITING THAT IS NOT CONTROLLED WITH YOUR NAUSEA MEDICATION *UNUSUAL SHORTNESS OF BREATH *UNUSUAL BRUISING OR BLEEDING *URINARY PROBLEMS (pain or burning when urinating, or frequent urination) *BOWEL PROBLEMS (unusual diarrhea, constipation, pain near the anus) TENDERNESS IN MOUTH AND THROAT WITH OR WITHOUT PRESENCE OF ULCERS (sore throat, sores in mouth, or a toothache) UNUSUAL RASH, SWELLING OR PAIN  UNUSUAL VAGINAL DISCHARGE OR ITCHING   Items with * indicate a potential emergency and should be followed up as soon as possible or go to the Emergency Department if any problems should occur.  Please show the CHEMOTHERAPY ALERT CARD or IMMUNOTHERAPY  ALERT CARD at check-in to the Emergency Department and triage nurse.  Should you have questions after your visit or need to cancel or reschedule your appointment, please contact [facility]  Dept: [phone]  and follow the prompts.  Office hours are 8:00 a.m. to 4:30 p.m. Monday - Friday. Please note that voicemails left after 4:00 p.m. may not be returned until the following business day.  We are closed weekends and major holidays. You have access to a nurse at all times for urgent questions. Please call the main number to the clinic Dept: [phone] and follow the prompts.   For any non-urgent questions, you may also contact your provider using MyChart. We now offer e-Visits for anyone 15 and older to request care online for non-urgent symptoms. For details visit [email or web address].   Also download the MyChart app! Go to the app store, search "MyChart", open the app, select [place], and log in with your MyChart username and password.

## 2023-01-17 ENCOUNTER — Other Ambulatory Visit: Payer: Self-pay | Admitting: Hematology

## 2023-01-18 ENCOUNTER — Inpatient Hospital Stay: Payer: Medicare Other

## 2023-01-18 VITALS — BP 130/49 | HR 64 | Temp 98.5°F | Resp 18

## 2023-01-18 DIAGNOSIS — E1142 Type 2 diabetes mellitus with diabetic polyneuropathy: Secondary | ICD-10-CM | POA: Diagnosis not present

## 2023-01-18 DIAGNOSIS — Z800 Family history of malignant neoplasm of digestive organs: Secondary | ICD-10-CM | POA: Diagnosis not present

## 2023-01-18 DIAGNOSIS — C779 Secondary and unspecified malignant neoplasm of lymph node, unspecified: Secondary | ICD-10-CM | POA: Diagnosis not present

## 2023-01-18 DIAGNOSIS — Z7901 Long term (current) use of anticoagulants: Secondary | ICD-10-CM | POA: Diagnosis not present

## 2023-01-18 DIAGNOSIS — Z90710 Acquired absence of both cervix and uterus: Secondary | ICD-10-CM | POA: Diagnosis not present

## 2023-01-18 DIAGNOSIS — C180 Malignant neoplasm of cecum: Secondary | ICD-10-CM | POA: Diagnosis not present

## 2023-01-18 DIAGNOSIS — C787 Secondary malignant neoplasm of liver and intrahepatic bile duct: Secondary | ICD-10-CM | POA: Diagnosis not present

## 2023-01-18 DIAGNOSIS — C182 Malignant neoplasm of ascending colon: Secondary | ICD-10-CM | POA: Diagnosis not present

## 2023-01-18 DIAGNOSIS — R918 Other nonspecific abnormal finding of lung field: Secondary | ICD-10-CM | POA: Diagnosis not present

## 2023-01-18 DIAGNOSIS — Z86000 Personal history of in-situ neoplasm of breast: Secondary | ICD-10-CM | POA: Diagnosis not present

## 2023-01-18 DIAGNOSIS — Z5189 Encounter for other specified aftercare: Secondary | ICD-10-CM | POA: Diagnosis not present

## 2023-01-18 DIAGNOSIS — Z803 Family history of malignant neoplasm of breast: Secondary | ICD-10-CM | POA: Diagnosis not present

## 2023-01-18 DIAGNOSIS — Z8049 Family history of malignant neoplasm of other genital organs: Secondary | ICD-10-CM | POA: Diagnosis not present

## 2023-01-18 DIAGNOSIS — G620 Drug-induced polyneuropathy: Secondary | ICD-10-CM | POA: Diagnosis not present

## 2023-01-18 DIAGNOSIS — I10 Essential (primary) hypertension: Secondary | ICD-10-CM | POA: Diagnosis not present

## 2023-01-18 DIAGNOSIS — Z86711 Personal history of pulmonary embolism: Secondary | ICD-10-CM | POA: Diagnosis not present

## 2023-01-18 DIAGNOSIS — C7963 Secondary malignant neoplasm of bilateral ovaries: Secondary | ICD-10-CM | POA: Diagnosis not present

## 2023-01-18 DIAGNOSIS — Z5111 Encounter for antineoplastic chemotherapy: Secondary | ICD-10-CM | POA: Diagnosis not present

## 2023-01-18 MED ORDER — HEPARIN SOD (PORK) LOCK FLUSH 100 UNIT/ML IV SOLN
500.0000 [IU] | Freq: Once | INTRAVENOUS | Status: AC | PRN
  Administered 2023-01-18: 500 [IU]

## 2023-01-18 MED ORDER — SODIUM CHLORIDE 0.9% FLUSH
10.0000 mL | INTRAVENOUS | Status: DC | PRN
  Administered 2023-01-18: 10 mL

## 2023-01-18 MED ORDER — PEGFILGRASTIM-CBQV 6 MG/0.6ML ~~LOC~~ SOSY
6.0000 mg | PREFILLED_SYRINGE | Freq: Once | SUBCUTANEOUS | Status: AC
  Administered 2023-01-18: 6 mg via SUBCUTANEOUS
  Filled 2023-01-18: qty 0.6

## 2023-01-18 NOTE — Patient Instructions (Signed)

## 2023-01-27 MED FILL — Dexamethasone Sodium Phosphate Inj 100 MG/10ML: INTRAMUSCULAR | Qty: 1 | Status: AC

## 2023-01-29 NOTE — Assessment & Plan Note (Signed)
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 Path showed overall Stage IIIB cancer.  -s/p 6 months adjuvant FOLFOX, completed 06/01/20, oxaliplatin held for final 2 cycles due to neuropathy.  -s/p BSO and hysterectomy on 12/22/21 with Dr. Pricilla Holm. Path revealed metastatic moderately differentiated colonic adenocarcinoma involving both ovaries and peritoneum -she restarted FOLFOX on 01/24/22. -Due to disease progression, chemotherapy changed to second line FOLFIRI and bevacizumab on April 06, 2022. She has been tolerating moderately well, better now with dose reduction, will continue -Restaging CT scan from 09/27/2022 showed continous response in liver and peritoneal metastasis, no other new lesions.   -We discussed maintenance therapy option in future  -due to her PE in 10/2022, will hold beva for 3 months  -restaging CT 12/31/2022 showed stable disease and live met is not well defined on CT. She restarted beva on 01/17/23

## 2023-01-29 NOTE — Assessment & Plan Note (Signed)
-

## 2023-01-30 ENCOUNTER — Encounter: Payer: Self-pay | Admitting: Hematology

## 2023-01-30 ENCOUNTER — Inpatient Hospital Stay: Payer: Medicare Other | Admitting: Hematology

## 2023-01-30 ENCOUNTER — Inpatient Hospital Stay: Payer: Medicare Other

## 2023-01-30 VITALS — BP 127/57 | HR 62 | Temp 98.3°F | Resp 18 | Ht 65.5 in | Wt 212.2 lb

## 2023-01-30 DIAGNOSIS — Z5189 Encounter for other specified aftercare: Secondary | ICD-10-CM | POA: Diagnosis not present

## 2023-01-30 DIAGNOSIS — C7963 Secondary malignant neoplasm of bilateral ovaries: Secondary | ICD-10-CM | POA: Diagnosis not present

## 2023-01-30 DIAGNOSIS — G620 Drug-induced polyneuropathy: Secondary | ICD-10-CM | POA: Diagnosis not present

## 2023-01-30 DIAGNOSIS — C50919 Malignant neoplasm of unspecified site of unspecified female breast: Secondary | ICD-10-CM

## 2023-01-30 DIAGNOSIS — Z95828 Presence of other vascular implants and grafts: Secondary | ICD-10-CM

## 2023-01-30 DIAGNOSIS — Z90710 Acquired absence of both cervix and uterus: Secondary | ICD-10-CM | POA: Diagnosis not present

## 2023-01-30 DIAGNOSIS — Z803 Family history of malignant neoplasm of breast: Secondary | ICD-10-CM | POA: Diagnosis not present

## 2023-01-30 DIAGNOSIS — Z170 Estrogen receptor positive status [ER+]: Secondary | ICD-10-CM | POA: Diagnosis not present

## 2023-01-30 DIAGNOSIS — Z86711 Personal history of pulmonary embolism: Secondary | ICD-10-CM | POA: Diagnosis not present

## 2023-01-30 DIAGNOSIS — Z5111 Encounter for antineoplastic chemotherapy: Secondary | ICD-10-CM | POA: Diagnosis not present

## 2023-01-30 DIAGNOSIS — C180 Malignant neoplasm of cecum: Secondary | ICD-10-CM | POA: Diagnosis not present

## 2023-01-30 DIAGNOSIS — I10 Essential (primary) hypertension: Secondary | ICD-10-CM | POA: Diagnosis not present

## 2023-01-30 DIAGNOSIS — R918 Other nonspecific abnormal finding of lung field: Secondary | ICD-10-CM | POA: Diagnosis not present

## 2023-01-30 DIAGNOSIS — C779 Secondary and unspecified malignant neoplasm of lymph node, unspecified: Secondary | ICD-10-CM | POA: Diagnosis not present

## 2023-01-30 DIAGNOSIS — C182 Malignant neoplasm of ascending colon: Secondary | ICD-10-CM

## 2023-01-30 DIAGNOSIS — C787 Secondary malignant neoplasm of liver and intrahepatic bile duct: Secondary | ICD-10-CM | POA: Diagnosis not present

## 2023-01-30 DIAGNOSIS — Z7901 Long term (current) use of anticoagulants: Secondary | ICD-10-CM | POA: Diagnosis not present

## 2023-01-30 DIAGNOSIS — Z86000 Personal history of in-situ neoplasm of breast: Secondary | ICD-10-CM | POA: Diagnosis not present

## 2023-01-30 DIAGNOSIS — Z8049 Family history of malignant neoplasm of other genital organs: Secondary | ICD-10-CM | POA: Diagnosis not present

## 2023-01-30 DIAGNOSIS — E1142 Type 2 diabetes mellitus with diabetic polyneuropathy: Secondary | ICD-10-CM | POA: Diagnosis not present

## 2023-01-30 DIAGNOSIS — Z800 Family history of malignant neoplasm of digestive organs: Secondary | ICD-10-CM | POA: Diagnosis not present

## 2023-01-30 LAB — CBC WITH DIFFERENTIAL (CANCER CENTER ONLY)
Abs Immature Granulocytes: 1.37 10*3/uL — ABNORMAL HIGH (ref 0.00–0.07)
Basophils Absolute: 0.1 10*3/uL (ref 0.0–0.1)
Basophils Relative: 1 %
Eosinophils Absolute: 0.3 10*3/uL (ref 0.0–0.5)
Eosinophils Relative: 2 %
HCT: 33.9 % — ABNORMAL LOW (ref 36.0–46.0)
Hemoglobin: 10.5 g/dL — ABNORMAL LOW (ref 12.0–15.0)
Immature Granulocytes: 8 %
Lymphocytes Relative: 24 %
Lymphs Abs: 4.2 10*3/uL — ABNORMAL HIGH (ref 0.7–4.0)
MCH: 32.1 pg (ref 26.0–34.0)
MCHC: 31 g/dL (ref 30.0–36.0)
MCV: 103.7 fL — ABNORMAL HIGH (ref 80.0–100.0)
Monocytes Absolute: 1 10*3/uL (ref 0.1–1.0)
Monocytes Relative: 6 %
Neutro Abs: 10.5 10*3/uL — ABNORMAL HIGH (ref 1.7–7.7)
Neutrophils Relative %: 59 %
Platelet Count: 240 10*3/uL (ref 150–400)
RBC: 3.27 MIL/uL — ABNORMAL LOW (ref 3.87–5.11)
RDW: 17 % — ABNORMAL HIGH (ref 11.5–15.5)
WBC Count: 17.6 10*3/uL — ABNORMAL HIGH (ref 4.0–10.5)
nRBC: 0 % (ref 0.0–0.2)

## 2023-01-30 LAB — CMP (CANCER CENTER ONLY)
ALT: 16 U/L (ref 0–44)
AST: 27 U/L (ref 15–41)
Albumin: 4 g/dL (ref 3.5–5.0)
Alkaline Phosphatase: 146 U/L — ABNORMAL HIGH (ref 38–126)
Anion gap: 8 (ref 5–15)
BUN: 14 mg/dL (ref 8–23)
CO2: 25 mmol/L (ref 22–32)
Calcium: 9.3 mg/dL (ref 8.9–10.3)
Chloride: 109 mmol/L (ref 98–111)
Creatinine: 0.88 mg/dL (ref 0.44–1.00)
GFR, Estimated: >60 mL/min (ref >60)
Glucose, Bld: 110 mg/dL — ABNORMAL HIGH (ref 70–99)
Potassium: 4.2 mmol/L (ref 3.5–5.1)
Sodium: 142 mmol/L (ref 135–145)
Total Bilirubin: 0.7 mg/dL (ref 0.3–1.2)
Total Protein: 6.3 g/dL — ABNORMAL LOW (ref 6.5–8.1)

## 2023-01-30 MED ORDER — ATROPINE SULFATE 1 MG/ML IV SOLN: 0.5000 mg | Freq: Once | INTRAVENOUS | Status: DC | PRN

## 2023-01-30 MED ORDER — PALONOSETRON HCL INJECTION 0.25 MG/5ML
0.2500 mg | Freq: Once | INTRAVENOUS | Status: AC
  Administered 2023-01-30: 0.25 mg via INTRAVENOUS
  Filled 2023-01-30: qty 5

## 2023-01-30 MED ORDER — SODIUM CHLORIDE 0.9% FLUSH
10.0000 mL | INTRAVENOUS | Status: DC | PRN
  Administered 2023-01-30: 10 mL

## 2023-01-30 MED ORDER — SODIUM CHLORIDE 0.9 % IV SOLN
2400.0000 mg/m2 | INTRAVENOUS | Status: DC
  Administered 2023-01-30: 5000 mg via INTRAVENOUS
  Filled 2023-01-30: qty 100

## 2023-01-30 MED ORDER — SODIUM CHLORIDE 0.9 % IV SOLN
400.0000 mg/m2 | Freq: Once | INTRAVENOUS | Status: AC
  Administered 2023-01-30: 896 mg via INTRAVENOUS
  Filled 2023-01-30: qty 44.8

## 2023-01-30 MED ORDER — SODIUM CHLORIDE 0.9 % IV SOLN: Freq: Once | INTRAVENOUS | Status: AC

## 2023-01-30 MED ORDER — SODIUM CHLORIDE 0.9 % IV SOLN
5.0000 mg/kg | Freq: Once | INTRAVENOUS | Status: AC
  Administered 2023-01-30: 500 mg via INTRAVENOUS
  Filled 2023-01-30: qty 4

## 2023-01-30 MED ORDER — SODIUM CHLORIDE 0.9 % IV SOLN
100.0000 mg/m2 | Freq: Once | INTRAVENOUS | Status: AC
  Administered 2023-01-30: 220 mg via INTRAVENOUS
  Filled 2023-01-30: qty 11

## 2023-01-30 MED ORDER — SODIUM CHLORIDE 0.9 % IV SOLN
10.0000 mg | Freq: Once | INTRAVENOUS | Status: AC
  Administered 2023-01-30: 10 mg via INTRAVENOUS
  Filled 2023-01-30: qty 10

## 2023-01-30 NOTE — Progress Notes (Signed)
[facility]   Telephone:(336) [phone] Fax:([phone]   Clinic Follow up Note   Patient Care Team: [name], MD as PCP - General (Family Medicine) [name], MD as Consulting Physician (General Surgery) [name], MD as Consulting Physician (Hematology) [name], MD as Consulting Physician (General Surgery) [name], MD as Consulting Physician (Hematology and Oncology)  Date of Service:  01/30/2023  CHIEF COMPLAINT: f/u of metastatic colon cancer  CURRENT THERAPY:  Second line chemotherapy FOLFIRI and bevacizumab  Oncology History   Malignant neoplasm of ascending colon (HCC) pT3N2aM0 stage IIB, MSS, metastatic to b/l ovaries and peritoneum and liver, KRAS mutation G12V (+)  -Initially diagnosed in 08/2019, s/p resection with Dr Maisie Fus on 11/01/19. Path showed overall Stage IIIB cancer.  -s/p 6 months adjuvant FOLFOX, completed 06/01/20, oxaliplatin held for final 2 cycles due to neuropathy.  -s/p BSO and hysterectomy on 12/22/21 with Dr. Pricilla Holm. Path revealed metastatic moderately differentiated colonic adenocarcinoma involving both ovaries and peritoneum -she restarted FOLFOX on 01/24/22. -Due to disease progression, chemotherapy changed to second line FOLFIRI and bevacizumab on April 06, 2022. She has been tolerating moderately well, better now with dose reduction, will continue -Restaging CT scan from 09/27/2022 showed continous response in liver and peritoneal metastasis, no other new lesions.   -We discussed maintenance therapy option in future  -due to her PE in 10/2022, will hold beva for 3 months  -restaging CT 12/31/2022 showed stable disease and live met is not well defined on CT. She restarted beva on 01/17/23   Malignant neoplasm of female breast [facility]) -Dx in 05/2014, s/p left lumpectomy with Dr Johna Sheriff, adjuvant RT with Dr [name], and Anastrozole 08/2014 - 01/13/20 under Dr Pamelia Hoit.  -DEXA 09/11/20 T score +1.2 normal.  -most  recent mammogram on 10/31/2022 was negative.     Assessment and Plan    Metastatic Colon Cancer Stable on chemotherapy. Diarrhea managed with Imodium. Neuropathy present but manageable with Pregabalin  -Will continue chemotherapy at the current dose -Next CT scan after Thanksgiving.  Peripheral Neuropathy Tingling present more often but not painful or unbearable. No sleep disturbances or significant functional impairment. -Continue Pregabalin 25mg  in the morning and 50mg  at night.  Venous Thromboembolism History of PE in June 2024. No current symptoms of shortness of breath. -Continue Eliquis.  Breast Cancer Diagnosed in 2016, completed treatment. -Continue annual mammograms.  General Health Maintenance -Next appointment on February 13, 2023. -Continue blood pressure, diabetic, and cholesterol medications. -Check white blood cell count due to elevated levels likely due to GCSF shot.      Plan -Lab reviewed, adequate for treatment, will proceed same dose chemotherapy today and continue every 2 weeks -Follow-up in 2 weeks    SUMMARY OF ONCOLOGIC HISTORY: Oncology History Overview Note   Cancer Staging  Malignant neoplasm of female breast [facility]) Staging form: Breast, AJCC 7th Edition - Clinical stage from 06/11/2014: Stage 0 (Tis (DCIS), N0, M0) - Unsigned Staged by: Pathologist and managing physician Laterality: Left Estrogen receptor status: Positive Progesterone receptor status: Positive Stage used in treatment planning: Yes National guidelines used in treatment planning: Yes Type of national guideline used in treatment planning: NCCN - Pathologic stage from 07/03/2014: Stage Unknown (Tis (DCIS), NX, cM0) - Signed by [name], MD on 07/10/2014 Staged by: Pathologist Laterality: Left Estrogen receptor status: Positive Progesterone receptor status: Positive Stage used in treatment planning: Yes National guidelines used in treatment planning: Yes Type of national  guideline used in treatment planning: NCCN Staging  comments: Staged on final lumpectomy specimen by Dr. [name].  Right colon cancer Staging form: Colon and Rectum, AJCC 8th Edition - Pathologic stage from 11/01/2019: Stage IIIB (pT3, pN2a, cM0) - Signed by [name], MD on 11/29/2019 Stage prefix: Initial diagnosis Histologic grading system: 4 grade system Histologic grade (G): G2 Residual tumor (R): R0 - None Tumor deposits (TD): Absent Perineural invasion (PNI): Absent Microsatellite instability (MSI): Stable KRAS mutation: Unknown NRAS mutation: Unknown BRAF mutation: Unknown     Malignant neoplasm of female breast (HCC)  05/29/2014 Initial Biopsy   Left breast needle core biopsy: Grade 2, DCIS with calcs. ER+ (100%), PR+ (96%).    06/04/2014 Initial Diagnosis   Left breast DCIS with calcifications, ER 100%, PR 96%   06/10/2014 Breast MRI   Left breast: 2.4 x 1.3 x 1.1 cm area of patchy non-mass enhancement upper outer quadrant includes postbiopsy seroma; Right breast: 1.2 cm previously biopsied stable benign fibroadenoma   06/12/2014 Procedure   Genetic counseling/testing: Identified 1 VUS on CHEK2 gene. Remainder of 17 gene panel tested negative and included: ATM, BARD1, BRCA 1/2, BRIP1, CDH1, CHEK2, EPCAM, MLH1, MSH2, MSH6, NBN, NF1, PALB2, PTEN, RAD50, RAD51C, RAD51D, STK11, and TP53.    07/01/2014 Surgery   Left breast lumpectomy ([facility]): Grade 1, DCIS, spanning 2.3 cm, 1 mm margin, ER 100%, PR 96%   07/31/2014 - 08/28/2014 Radiation Therapy   Adjuvant RT completed [name]). Left breast: Total dose 42.5 Gy over 17 fractions. Left breast boost: Total dose 7.5 Gy over 3 fractions.    09/14/2014 - 01/13/2020 Anti-estrogen oral therapy   Anastrazole 1mg  daily. Planned duration of treatment: 5 years [place]). Completed in 01/2020.    09/25/2014 Survivorship   Survivorship Care Plan given to patient and reviewed with her in person.    03/02/2021 Imaging   CT CAP  IMPRESSION: 1. No  findings of active/recurrent malignancy. Partial right hemicolectomy. 2. Endometrial stripe remains mildly thickened, but endometrial biopsy in May was negative for malignancy. 3. Progressive endplate sclerosis and endplate irregularity at T2-3, probably due to degenerative endplate findings. If the has referable upper thoracic pain/symptoms then thoracic spine MRI could be used for further workup. 4. Other imaging findings of potential clinical significance: Mild cardiomegaly. Aortic Atherosclerosis (ICD10-I70.0). Mild mitral valve calcification. Postoperative findings in the left breast with adjacent radiation port anteriorly in the left upper lobe. Tiny pulmonary nodules in the left lower lobe are unchanged from earliest available comparison of 10/17/2019 and probably benign although may merit surveillance. Multilevel lumbar impingement. Mild pelvic floor laxity.   Malignant neoplasm of ascending colon (HCC)  09/19/2019 Imaging   CT AP W contrast 09/19/19  IMPRESSION Fullness in the cecum, cannot exclude a mass. No evidence for metastatic disease is identified.    09/24/2019 Procedure   Colonoscopy by Dr [name] 09/24/19 IMPRESSION 1. The colon was redundant  2. Mild diverticulosis was noted through the entire examined colon 3. Single 12mm polyp was found in the ascending colon; polypectomy was performed using snare cautery and biopsy forceps 4. Mild diverticulosis was notes in the descending colon and sigmoid colon.  5. Single polyp was found in the sigmoid colon, polypectomy was performed with cold forceps.  6. Single polyp was found in the rectosigmoid colon; polypectomy was performed with cold snare  7. Small internal hemorrhoids  8. Large mass was found at the cecum; multiple biopsies of the area were performed using cold forceps; injection (tattooing) was performed distal to the mass.    09/24/2019 Initial  Biopsy   INTERPRETATION AND DIAGNOSIS:  A. Cecum, biopsy:  Invasive  moderately differentiated adenocarcinoma.  see comment  B. Polyp @ ascending colon, polypectomy:  Tubular Adenoma  C. Polyp @ sigmoid colon Polypectomy:  hyperplastic polyp.  D. Polyp @ rectosigmoid colon, Polypectomy:  Hyperplastic Polyp      10/16/2019 Imaging   CT Chest IMPRESSION: 1. Multiple small pulmonary nodules measuring 5 mm or less in size in the lungs. These are nonspecific and are typically considered statistically likely benign. However, given the patient's history of primary malignancy, close attention on follow-up studies is recommended to ensure stability. 2. Aortic atherosclerosis, in addition to right coronary artery disease. Assessment for potential risk factor modification, dietary therapy or pharmacologic therapy may be warranted, if clinically indicated. 3. There are calcifications of the aortic valve and mitral annulus. Echocardiographic correlation for evaluation of potential valvular dysfunction may be warranted if clinically indicated. 4. Small hiatal hernia.   Aortic Atherosclerosis (ICD10-I70.0).   11/01/2019 Initial Diagnosis   Colon cancer (HCC)   11/01/2019 Surgery   LAPAROSCOPIC PARTIAL COLECTOMY by Dr Maisie Fus and Dr Michaell Cowing   11/01/2019 Pathology Results   FINAL MICROSCOPIC DIAGNOSIS:   A. COLON, PROXIMAL RIGHT, COLECTOMY:  - Invasive colonic adenocarcinoma, 5 cm.  - Tumor invades through the muscularis propria into pericolonic tissues.   - Margins of resection are not involved.  - Metastatic carcinoma in (5) of (13) lymph nodes.  - See oncology table.    MSI Stable  Mismatch repair normal  MLH1 - Preserved nuclear expression (greater 50% tumor expression) MSH2 - Preserved nuclear expression (greater 50% tumor expression) MSH6 - Preserved nuclear expression (greater 50% tumor expression) PMS2 - Preserved nuclear expression (greater 50% tumor expression)   11/01/2019 Cancer Staging   Staging form: Colon and Rectum, AJCC 8th Edition -  Pathologic stage from 11/01/2019: Stage IIIB (pT3, pN2a, cM0) - Signed by [name], MD on 11/29/2019   12/10/2019 Procedure   PAC placed 12/10/19   12/17/2019 - 06/01/2020 Chemotherapy   FOLFOX q2weeks starting in 2 weeks starting 12/17/19. Held 01/27/20-02/10/20 due to b/l PE. Oxaliplatin held C11-12 due to neuropathy. Completed on 06/01/20   03/02/2021 Imaging   CT CAP  IMPRESSION: 1. No findings of active/recurrent malignancy. Partial right hemicolectomy. 2. Endometrial stripe remains mildly thickened, but endometrial biopsy in May was negative for malignancy. 3. Progressive endplate sclerosis and endplate irregularity at T2-3, probably due to degenerative endplate findings. If the has referable upper thoracic pain/symptoms then thoracic spine MRI could be used for further workup. 4. Other imaging findings of potential clinical significance: Mild cardiomegaly. Aortic Atherosclerosis (ICD10-I70.0). Mild mitral valve calcification. Postoperative findings in the left breast with adjacent radiation port anteriorly in the left upper lobe. Tiny pulmonary nodules in the left lower lobe are unchanged from earliest available comparison of 10/17/2019 and probably benign although may merit surveillance. Multilevel lumbar impingement. Mild pelvic floor laxity.   08/26/2021 Imaging   EXAM: CT CHEST, ABDOMEN, AND PELVIS WITH CONTRAST  IMPRESSION: 1. Stable examination without new or progressive findings to suggest local recurrence or metastatic disease within the chest, abdomen, or pelvis. 2. Hepatomegaly with hepatic steatosis. 3. Sigmoid colonic diverticulosis without findings of acute diverticulitis. 4. Similar prominent endplate sclerosis and irregularity at T2-T3 is most consistent with Modic type endplate degenerative changes. However, if patient has referable upper thoracic pain consider further workup with thoracic spine MRI. 5. Similar thickening of the endometrial stripe measuring up to  8 mm, which was previously biopsied with  results negative for malignancy. 6. Aortic Atherosclerosis (ICD10-I70.0).   11/10/2021 PET scan   IMPRESSION: 1. LEFT ovary is increased in size and is intensely hypermetabolic. While physiologic hypermetabolic ovarian tissue is not uncommon, the enlargement and asymmetric activity warrants further evaluation. Consider contrast pelvic MRI vs tissue sampling. 2. No evidence of metastatic colorectal carcinoma otherwise. 3. Post RIGHT hemicolectomy anatomy. 4. Evidence of radiation change in the LEFT upper lobe (remote breast cancer).     12/22/2021 Relapse/Recurrence    FINAL MICROSCOPIC DIAGNOSIS:   A. LEFT OVARY AND FALLOPIAN TUBE, SALPINGO OOPHORECTOMY:  - Metastatic moderately differentiated colonic adenocarcinoma involving left ovary  - Focal ovarian stromal calcification  - Segment of benign fallopian tube   B. UTERUS WITH RIGHT FALLOPIAN TUBE AND OVARY, HYSTERECTOMY AND SALPINGO-OOPHORECTOMY:  - Metastatic moderately differentiated colonic adenocarcinoma involving right ovary  - Focal invasive extensive adenomyosis  - Benign endometrial polyps  - Benign proliferative phase endometrium  - Hydrosalpinx of right fallopian tube  - Focal ovarian stromal calcification   C. PERITONEAL DEPOSITS, ANTERIOR CUL DE SAC, BIOPSY:  - Metastatic mucinous adenocarcinoma, consistent with colorectal primary    COMMENT:  Immunohistochemical stains show that the tumor cells are positive for CK20 and CDX2 while they are negative for CK7 and PAX8, consistent with above interpretation.    01/24/2022 - 03/23/2022 Chemotherapy   Patient is on Treatment Plan : COLORECTAL FOLFOX q14d x 3 months     04/01/2022 Imaging   CT AP IMPRESSION: 1. New omental metastatic disease. 2. Vague hypoattenuating lesion in the inferior right hepatic lobe is new and also worrisome for metastatic disease. 3. Similar small right lower lobe nodules. Recommend continued attention  on follow-up. 4. Steatotic enlarged liver. 5.  Aortic atherosclerosis (ICD10-I70.0).   04/06/2022 -  Chemotherapy   Patient is on Treatment Plan : COLORECTAL FOLFIRI + Bevacizumab q14d     04/21/2022 Imaging    IMPRESSION: 1. Stable chest CT. No evidence of metastatic disease. 2. Stable small pulmonary nodules, considered benign based on stability. 3. Stable postsurgical changes in the left breast and anterior left upper lobe radiation changes. 4. Aortic Atherosclerosis (ICD10-I70.0) and Emphysema (ICD10-J43.9).   04/21/2022 Imaging    IMPRESSION: 1. Small enhancing lesion inferiorly in the right hepatic lobe is typical of metastatic disease and stable from recent abdominal CT. No other definite liver lesions are identified. Mild hepatic steatosis. 2. No other significant abdominal findings. 3. Omental nodularity seen on CT is not well visualized by MRI.    Imaging     06/23/2022 Imaging    IMPRESSION: 1. Hypodense lesion of the inferior right lobe of the liver, hepatic segment VI is diminished in size, consistent with treatment response. 2. Tiny peritoneal and omental nodules identified by prior examination are diminished in size, consistent with treatment response. 3. Multiple tiny pulmonary nodules unchanged, most likely benign and incidental, however continued attention on follow-up warranted in the setting of known metastatic disease. 4. No evidence of new metastatic disease in the chest, abdomen, or pelvis. 5. Status post partial right hemicolectomy and reanastomosis. 6. Diffuse mosaic attenuation of the airspaces, consistent with small airways disease. 7. Hepatomegaly.      Discussed the use of AI scribe software for clinical note transcription with the patient, who gave verbal consent to proceed.  History of Present Illness   The patient, a 77 year old female with a history of metastatic colon cancer and breast cancer, presents for a follow-up visit. She is  currently on chemotherapy and reports  experiencing diarrhea for about three days after each treatment. She manages this with Imodium and does not let it progress beyond a day. She denies any associated pain or nausea.  The patient also reports neuropathy, which has worsened over time but is still manageable. She experiences tingling in her hands more often, but it does not wake her up at night or significantly interfere with her daily activities. She is currently taking Pregabalin (25mg  in the morning and 50mg  at night) for this condition.  She also has a history of a blood clot and is currently on Eliquis. She denies any shortness of breath. She was diagnosed with breast cancer in 2016 and has completed all treatments. She continues to have annual mammograms.         All other systems were reviewed with the patient and are negative.  MEDICAL HISTORY:  Past Medical History:  Diagnosis Date   Aortic atherosclerosis (HCC)    Arthritis    feet, lower back   Basal cell carcinoma    arm   Breast cancer of upper-outer quadrant of left female breast (HCC) 06/04/2014   Cataract    immature on the left   Colon cancer (HCC) 08/2019   Diabetes mellitus without complication (HCC)    Diverticulosis    Dizziness    > 30yrs ago;took Antivert    Family history of anesthesia complication    sister slow to wake up with anesthesia   Family history of breast cancer    Family history of colon cancer    Family history of uterine cancer    GERD (gastroesophageal reflux disease)    takes occasional TUMs   History of bronchitis    > 44yrs ago   History of colon polyps    History of hiatal hernia    Small noted on CT   History of pulmonary embolus (PE)    Hypertension    takes Losartan daily and HCTZ   Iron deficiency anemia    Joint pain    Numbness    to toes on each foot   Peripheral neuropathy    feet and toes   Personal history of radiation therapy    Pulmonary nodules    Noted on CT    Radiation 07/31/14-08/28/14   Left Breast 20 fxs   Seasonal allergies    takes Claritin prn   Urinary frequency    Vitamin D deficiency    takes VIt D daily    SURGICAL HISTORY: Past Surgical History:  Procedure Laterality Date   BREAST BIOPSY Bilateral    BREAST LUMPECTOMY Left    BREAST LUMPECTOMY WITH RADIOACTIVE SEED LOCALIZATION Left 07/01/2014   Procedure: LEFT BREAST LUMPECTOMY WITH RADIOACTIVE SEED LOCALIZATION;  Surgeon: Glenna Fellows, MD;  Location: [facility];  Service: General;  Laterality: Left;   CATARACT EXTRACTION Right    COLONOSCOPY  09/24/2019   [place]   LAPAROSCOPIC PARTIAL COLECTOMY N/A 11/01/2019   Procedure: LAPAROSCOPIC PARTIAL COLECTOMY;  Surgeon: [name], MD;  Location: [facility];  Service: General;  Laterality: N/A;   POLYPECTOMY     PORTACATH PLACEMENT N/A 12/10/2019   Procedure: INSERTION PORT-A-CATH ULTRASOUND GUIDED IN RIGHT IJ;  Surgeon: [name], MD;  Location: [facility];  Service: General;  Laterality: N/A;   ROBOTIC ASSISTED TOTAL HYSTERECTOMY Bilateral 12/22/2021   Procedure: XI ROBOTIC ASSISTED TOTAL HYSTERECTOMY WITH BILATERAL SALPINGO OOPHORECTOMY;  Surgeon: Carver Fila, MD;  Location: [facility];  Service: Gynecology;  Laterality: Bilateral;   TOTAL KNEE  ARTHROPLASTY Left 10/24/2012   Procedure: TOTAL KNEE ARTHROPLASTY;  Surgeon: Nestor Lewandowsky, MD;  Location: [facility];  Service: Orthopedics;  Laterality: Left;   TOTAL KNEE ARTHROPLASTY Right 01/09/2013   Procedure: TOTAL KNEE ARTHROPLASTY;  Surgeon: Nestor Lewandowsky, MD;  Location: [facility];  Service: Orthopedics;  Laterality: Right;   TUBAL LIGATION      I have reviewed the social history and family history with the patient and they are unchanged from previous note.  ALLERGIES:  is allergic to oxycodone.  MEDICATIONS:  Current Outpatient Medications  Medication Sig Dispense Refill   apixaban (ELIQUIS) 5 MG TABS tablet Take 1 tablet (5 mg total) by mouth 2 (two) times  daily. 60 tablet 2   b complex vitamins capsule Take 1 capsule by mouth daily.     Cholecalciferol (VITAMIN D) 2000 UNITS CAPS Take 2,000 Units by mouth daily.      ibuprofen (ADVIL) 200 MG tablet Take 200 mg by mouth daily as needed (pain).     lidocaine-prilocaine (EMLA) cream Apply 1 Application topically as needed. 30 g 0   loratadine (CLARITIN) 10 MG tablet Take 10 mg by mouth daily as needed for allergies.     losartan (COZAAR) 25 MG tablet Take 1 tablet (25 mg total) by mouth daily. 90 tablet 3   metFORMIN (GLUCOPHAGE) 500 MG tablet Take 1 tablet (500 mg total) by mouth in the morning and at bedtime. 180 tablet 3   Multiple Vitamins-Minerals (MULTIVITAMIN WITH MINERALS) tablet Take 1 tablet by mouth daily.     omeprazole (PRILOSEC) 20 MG capsule Take 20 mg by mouth daily before breakfast.      ondansetron (ZOFRAN) 8 MG tablet Take 1 tablet (8 mg total) by mouth every 8 (eight) hours as needed for nausea or vomiting. Start on the third day after chemotherapy. 30 tablet 1   potassium chloride (KLOR-CON) 10 MEQ tablet Take 1 tablet by mouth once daily 30 tablet 0   pravastatin (PRAVACHOL) 10 MG tablet Take 1 tablet (10 mg total) by mouth daily. 90 tablet 3   pregabalin (LYRICA) 25 MG capsule Take 1 capsule (25 mg total) by mouth every morning. Take in addition to 50 mg at bedtime 60 capsule 2   pregabalin (LYRICA) 50 MG capsule Take 1 capsule (50 mg total) by mouth at bedtime. Take in addition to 25 mg in the morning 60 capsule 2   prochlorperazine (COMPAZINE) 10 MG tablet Take 1 tablet (10 mg total) by mouth every 6 (six) hours as needed for nausea or vomiting. 30 tablet 1   triamcinolone ointment (KENALOG) 0.5 % Apply 1 Application topically 2 (two) times daily. 30 g 0   No current facility-administered medications for this visit.   Facility-Administered Medications Ordered in Other Visits  Medication Dose Route Frequency Provider Last Rate Last Admin   atropine injection 0.5 mg  0.5 mg  Intravenous Once PRN [name], MD       fluorouracil (ADRUCIL) 5,000 mg in sodium chloride 0.9 % 150 mL chemo infusion  2,400 mg/m2 (Treatment Plan Recorded) Intravenous 1 day or 1 dose [name], MD   5,000 mg at 01/30/23 1217    PHYSICAL EXAMINATION: ECOG PERFORMANCE STATUS: 1 - Symptomatic but completely ambulatory  Vitals:   01/30/23 0923 01/30/23 0924  BP: (!) 143/54 (!) 127/57  Pulse: 62   Resp: 18   Temp: 98.3 F (36.8 C)   SpO2: 100%    Wt Readings from Last 3 Encounters:  01/30/23 212  lb 3.2 oz (96.3 kg)  01/16/23 212 lb 14.4 oz (96.6 kg)  01/03/23 217 lb 7 oz (98.6 kg)     GENERAL:alert, no distress and comfortable SKIN: skin color, texture, turgor are normal, no rashes or significant lesions EYES: normal, Conjunctiva are pink and non-injected, sclera clear NECK: supple, thyroid normal size, non-tender, without nodularity LYMPH:  no palpable lymphadenopathy in the cervical, axillary  LUNGS: clear to auscultation and percussion with normal breathing effort HEART: regular rate & rhythm and no murmurs and no lower extremity edema ABDOMEN:abdomen soft, non-tender and normal bowel sounds Musculoskeletal:no cyanosis of digits and no clubbing  NEURO: alert & oriented x 3 with fluent speech, no focal motor/sensory deficits  Physical Exam          LABORATORY DATA:  I have reviewed the data as listed    Latest Ref Rng & Units 01/30/2023    8:41 AM 01/16/2023    8:51 AM 01/03/2023    7:36 AM  CBC  WBC 4.0 - 10.5 K/uL 17.6  9.1  15.4   Hemoglobin 12.0 - 15.0 g/dL 16.1  09.6  9.9   Hematocrit 36.0 - 46.0 % 33.9  33.2  31.2   Platelets 150 - 400 K/uL 240  220  210         Latest Ref Rng & Units 01/30/2023    8:41 AM 01/16/2023    8:51 AM 01/03/2023    7:36 AM  CMP  Glucose 70 - 99 mg/dL 045  97  409   BUN 8 - 23 mg/dL 14  12  14    Creatinine 0.44 - 1.00 mg/dL 8.11  9.14  7.82   Sodium 135 - 145 mmol/L 142  145  144   Potassium 3.5 - 5.1 mmol/L 4.2  3.8  3.6    Chloride 98 - 111 mmol/L 109  111  112   CO2 22 - 32 mmol/L 25  28  26    Calcium 8.9 - 10.3 mg/dL 9.3  9.2  8.8   Total Protein 6.5 - 8.1 g/dL 6.3  6.1  6.0   Total Bilirubin 0.3 - 1.2 mg/dL 0.7  0.9  0.8   Alkaline Phos 38 - 126 U/L 146  128  122   AST 15 - 41 U/L 27  16  21    ALT 0 - 44 U/L 16  12  16        RADIOGRAPHIC STUDIES: I have personally reviewed the radiological images as listed and agreed with the findings in the report. No results found.    No orders of the defined types were placed in this encounter.  All questions were answered. The patient knows to call the clinic with any problems, questions or concerns. No barriers to learning was detected. The total time spent in the appointment was 25 minutes.     [name], MD 01/30/2023

## 2023-01-31 DIAGNOSIS — K080 Exfoliation of teeth due to systemic causes: Secondary | ICD-10-CM | POA: Diagnosis not present

## 2023-02-01 ENCOUNTER — Inpatient Hospital Stay: Payer: Medicare Other | Attending: Hematology

## 2023-02-01 VITALS — BP 126/57 | HR 65 | Temp 98.3°F | Resp 18

## 2023-02-01 DIAGNOSIS — Z5111 Encounter for antineoplastic chemotherapy: Secondary | ICD-10-CM | POA: Insufficient documentation

## 2023-02-01 DIAGNOSIS — Z5189 Encounter for other specified aftercare: Secondary | ICD-10-CM | POA: Diagnosis not present

## 2023-02-01 DIAGNOSIS — E876 Hypokalemia: Secondary | ICD-10-CM | POA: Insufficient documentation

## 2023-02-01 DIAGNOSIS — C182 Malignant neoplasm of ascending colon: Secondary | ICD-10-CM | POA: Diagnosis not present

## 2023-02-01 DIAGNOSIS — C787 Secondary malignant neoplasm of liver and intrahepatic bile duct: Secondary | ICD-10-CM | POA: Diagnosis not present

## 2023-02-01 DIAGNOSIS — Z853 Personal history of malignant neoplasm of breast: Secondary | ICD-10-CM | POA: Diagnosis not present

## 2023-02-01 DIAGNOSIS — E1142 Type 2 diabetes mellitus with diabetic polyneuropathy: Secondary | ICD-10-CM | POA: Diagnosis not present

## 2023-02-01 DIAGNOSIS — C786 Secondary malignant neoplasm of retroperitoneum and peritoneum: Secondary | ICD-10-CM | POA: Diagnosis not present

## 2023-02-01 DIAGNOSIS — C7963 Secondary malignant neoplasm of bilateral ovaries: Secondary | ICD-10-CM | POA: Insufficient documentation

## 2023-02-01 DIAGNOSIS — Z90722 Acquired absence of ovaries, bilateral: Secondary | ICD-10-CM | POA: Insufficient documentation

## 2023-02-01 DIAGNOSIS — Z90710 Acquired absence of both cervix and uterus: Secondary | ICD-10-CM | POA: Diagnosis not present

## 2023-02-01 MED ORDER — PEGFILGRASTIM-CBQV 6 MG/0.6ML ~~LOC~~ SOSY
6.0000 mg | PREFILLED_SYRINGE | Freq: Once | SUBCUTANEOUS | Status: AC
  Administered 2023-02-01: 6 mg via SUBCUTANEOUS
  Filled 2023-02-01: qty 0.6

## 2023-02-01 MED ORDER — HEPARIN SOD (PORK) LOCK FLUSH 100 UNIT/ML IV SOLN
500.0000 [IU] | Freq: Once | INTRAVENOUS | Status: AC | PRN
  Administered 2023-02-01: 500 [IU]

## 2023-02-01 MED ORDER — SODIUM CHLORIDE 0.9% FLUSH
10.0000 mL | INTRAVENOUS | Status: DC | PRN
  Administered 2023-02-01: 10 mL

## 2023-02-07 ENCOUNTER — Other Ambulatory Visit: Payer: Self-pay | Admitting: Nurse Practitioner

## 2023-02-10 MED FILL — Dexamethasone Sodium Phosphate Inj 100 MG/10ML: INTRAMUSCULAR | Qty: 1 | Status: AC

## 2023-02-13 ENCOUNTER — Inpatient Hospital Stay: Payer: Medicare Other

## 2023-02-13 ENCOUNTER — Inpatient Hospital Stay: Payer: Medicare Other | Admitting: Hematology

## 2023-02-13 ENCOUNTER — Encounter: Payer: Self-pay | Admitting: Hematology

## 2023-02-13 ENCOUNTER — Other Ambulatory Visit: Payer: Self-pay

## 2023-02-13 VITALS — BP 145/54 | HR 57 | Temp 98.3°F | Resp 18 | Ht 65.5 in | Wt 214.4 lb

## 2023-02-13 DIAGNOSIS — C182 Malignant neoplasm of ascending colon: Secondary | ICD-10-CM

## 2023-02-13 DIAGNOSIS — Z5189 Encounter for other specified aftercare: Secondary | ICD-10-CM | POA: Diagnosis not present

## 2023-02-13 DIAGNOSIS — Z90722 Acquired absence of ovaries, bilateral: Secondary | ICD-10-CM | POA: Diagnosis not present

## 2023-02-13 DIAGNOSIS — C787 Secondary malignant neoplasm of liver and intrahepatic bile duct: Secondary | ICD-10-CM | POA: Diagnosis not present

## 2023-02-13 DIAGNOSIS — Z853 Personal history of malignant neoplasm of breast: Secondary | ICD-10-CM | POA: Diagnosis not present

## 2023-02-13 DIAGNOSIS — Z95828 Presence of other vascular implants and grafts: Secondary | ICD-10-CM

## 2023-02-13 DIAGNOSIS — C7963 Secondary malignant neoplasm of bilateral ovaries: Secondary | ICD-10-CM | POA: Diagnosis not present

## 2023-02-13 DIAGNOSIS — Z170 Estrogen receptor positive status [ER+]: Secondary | ICD-10-CM | POA: Diagnosis not present

## 2023-02-13 DIAGNOSIS — C50919 Malignant neoplasm of unspecified site of unspecified female breast: Secondary | ICD-10-CM | POA: Diagnosis not present

## 2023-02-13 DIAGNOSIS — E876 Hypokalemia: Secondary | ICD-10-CM | POA: Diagnosis not present

## 2023-02-13 DIAGNOSIS — E1142 Type 2 diabetes mellitus with diabetic polyneuropathy: Secondary | ICD-10-CM | POA: Diagnosis not present

## 2023-02-13 DIAGNOSIS — C786 Secondary malignant neoplasm of retroperitoneum and peritoneum: Secondary | ICD-10-CM | POA: Diagnosis not present

## 2023-02-13 DIAGNOSIS — Z5111 Encounter for antineoplastic chemotherapy: Secondary | ICD-10-CM | POA: Diagnosis not present

## 2023-02-13 DIAGNOSIS — Z90710 Acquired absence of both cervix and uterus: Secondary | ICD-10-CM | POA: Diagnosis not present

## 2023-02-13 LAB — CBC WITH DIFFERENTIAL (CANCER CENTER ONLY)
Abs Immature Granulocytes: 0.65 10*3/uL — ABNORMAL HIGH (ref 0.00–0.07)
Basophils Absolute: 0.1 10*3/uL (ref 0.0–0.1)
Basophils Relative: 1 %
Eosinophils Absolute: 0.3 10*3/uL (ref 0.0–0.5)
Eosinophils Relative: 2 %
HCT: 33.2 % — ABNORMAL LOW (ref 36.0–46.0)
Hemoglobin: 10.4 g/dL — ABNORMAL LOW (ref 12.0–15.0)
Immature Granulocytes: 6 %
Lymphocytes Relative: 31 %
Lymphs Abs: 3.4 10*3/uL (ref 0.7–4.0)
MCH: 32.4 pg (ref 26.0–34.0)
MCHC: 31.3 g/dL (ref 30.0–36.0)
MCV: 103.4 fL — ABNORMAL HIGH (ref 80.0–100.0)
Monocytes Absolute: 1 10*3/uL (ref 0.1–1.0)
Monocytes Relative: 9 %
Neutro Abs: 5.7 10*3/uL (ref 1.7–7.7)
Neutrophils Relative %: 51 %
Platelet Count: 207 10*3/uL (ref 150–400)
RBC: 3.21 MIL/uL — ABNORMAL LOW (ref 3.87–5.11)
RDW: 17.3 % — ABNORMAL HIGH (ref 11.5–15.5)
WBC Count: 11 10*3/uL — ABNORMAL HIGH (ref 4.0–10.5)
nRBC: 0 % (ref 0.0–0.2)

## 2023-02-13 LAB — CMP (CANCER CENTER ONLY)
ALT: 18 U/L (ref 0–44)
AST: 23 U/L (ref 15–41)
Albumin: 3.9 g/dL (ref 3.5–5.0)
Alkaline Phosphatase: 131 U/L — ABNORMAL HIGH (ref 38–126)
Anion gap: 5 (ref 5–15)
BUN: 14 mg/dL (ref 8–23)
CO2: 26 mmol/L (ref 22–32)
Calcium: 9 mg/dL (ref 8.9–10.3)
Chloride: 109 mmol/L (ref 98–111)
Creatinine: 0.88 mg/dL (ref 0.44–1.00)
GFR, Estimated: >60 mL/min (ref >60)
Glucose, Bld: 104 mg/dL — ABNORMAL HIGH (ref 70–99)
Potassium: 4.4 mmol/L (ref 3.5–5.1)
Sodium: 140 mmol/L (ref 135–145)
Total Bilirubin: 0.8 mg/dL (ref 0.3–1.2)
Total Protein: 6.3 g/dL — ABNORMAL LOW (ref 6.5–8.1)

## 2023-02-13 LAB — TOTAL PROTEIN, URINE DIPSTICK: Protein, ur: NEGATIVE mg/dL

## 2023-02-13 LAB — CEA (ACCESS): CEA (CHCC): 8.1 ng/mL — ABNORMAL HIGH (ref 0.00–5.00)

## 2023-02-13 MED ORDER — SODIUM CHLORIDE 0.9 % IV SOLN: Freq: Once | INTRAVENOUS | Status: AC

## 2023-02-13 MED ORDER — SODIUM CHLORIDE 0.9% FLUSH
10.0000 mL | INTRAVENOUS | Status: DC | PRN
  Administered 2023-02-13: 10 mL

## 2023-02-13 MED ORDER — SODIUM CHLORIDE 0.9 % IV SOLN
2400.0000 mg/m2 | INTRAVENOUS | Status: DC
  Administered 2023-02-13: 5000 mg via INTRAVENOUS
  Filled 2023-02-13: qty 100

## 2023-02-13 MED ORDER — SODIUM CHLORIDE 0.9 % IV SOLN
5.0000 mg/kg | Freq: Once | INTRAVENOUS | Status: AC
  Administered 2023-02-13: 500 mg via INTRAVENOUS
  Filled 2023-02-13: qty 16

## 2023-02-13 MED ORDER — ATROPINE SULFATE 1 MG/ML IV SOLN
0.5000 mg | Freq: Once | INTRAVENOUS | Status: DC | PRN
  Filled 2023-02-13: qty 1

## 2023-02-13 MED ORDER — SODIUM CHLORIDE 0.9 % IV SOLN
100.0000 mg/m2 | Freq: Once | INTRAVENOUS | Status: AC
  Administered 2023-02-13: 220 mg via INTRAVENOUS
  Filled 2023-02-13: qty 11

## 2023-02-13 MED ORDER — PALONOSETRON HCL INJECTION 0.25 MG/5ML
0.2500 mg | Freq: Once | INTRAVENOUS | Status: AC
  Administered 2023-02-13: 0.25 mg via INTRAVENOUS
  Filled 2023-02-13: qty 5

## 2023-02-13 MED ORDER — SODIUM CHLORIDE 0.9 % IV SOLN
10.0000 mg | Freq: Once | INTRAVENOUS | Status: AC
  Administered 2023-02-13: 10 mg via INTRAVENOUS
  Filled 2023-02-13: qty 10

## 2023-02-13 MED ORDER — POTASSIUM CHLORIDE ER 10 MEQ PO TBCR: 10.0000 meq | EXTENDED_RELEASE_TABLET | Freq: Every day | ORAL | 0 refills | Status: DC

## 2023-02-13 MED ORDER — SODIUM CHLORIDE 0.9 % IV SOLN
400.0000 mg/m2 | Freq: Once | INTRAVENOUS | Status: AC
  Administered 2023-02-13: 896 mg via INTRAVENOUS
  Filled 2023-02-13: qty 44.8

## 2023-02-13 NOTE — Assessment & Plan Note (Signed)
-  Dx in 05/2014, s/p left lumpectomy with Dr Johna Sheriff, adjuvant RT with Dr [name], and Anastrozole 08/2014 - 01/13/20 under Dr Pamelia Hoit.  -DEXA 09/11/20 T score +1.2 normal.  -most recent mammogram on 10/31/2022 was negative.

## 2023-02-13 NOTE — Progress Notes (Signed)
Per Dr. Mosetta Putt, urine protein required every other treatment.

## 2023-02-13 NOTE — Assessment & Plan Note (Signed)
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 Path showed overall Stage IIIB cancer.  -s/p 6 months adjuvant FOLFOX, completed 06/01/20, oxaliplatin held for final 2 cycles due to neuropathy.  -s/p BSO and hysterectomy on 12/22/21 with Dr. Pricilla Holm. Path revealed metastatic moderately differentiated colonic adenocarcinoma involving both ovaries and peritoneum -she restarted FOLFOX on 01/24/22. -Due to disease progression, chemotherapy changed to second line FOLFIRI and bevacizumab on April 06, 2022. She has been tolerating moderately well, better now with dose reduction, will continue -Restaging CT scan from 09/27/2022 showed continous response in liver and peritoneal metastasis, no other new lesions.   -We discussed maintenance therapy option in future  -due to her PE in 10/2022, will hold beva for 3 months  -restaging CT 12/31/2022 showed stable disease and live met is not well defined on CT. She restarted beva on 01/17/23

## 2023-02-13 NOTE — Progress Notes (Signed)
[facility]   [phone] [phone]   Clinic Follow up Note   Patient Care Team: [name], MD as PCP - General (Family Medicine) [name], MD as Consulting Physician (General Surgery) [name], MD as Consulting Physician (Hematology) [name], MD as Consulting Physician (General Surgery) [name], MD as Consulting Physician (Hematology and Oncology)  Date of Service:  02/13/2023  CHIEF COMPLAINT: f/u of metastatic colon cancer  CURRENT THERAPY:  FOLFIRI and bevacizumab every 2 weeks  Oncology History   Malignant neoplasm of ascending colon (HCC) pT3N2aM0 stage IIB, MSS, metastatic to b/l ovaries and peritoneum and liver, KRAS mutation G12V (+)  -Initially diagnosed in 08/2019, s/p resection with Dr Maisie Fus on 11/01/19. Path showed overall Stage IIIB cancer.  -s/p 6 months adjuvant FOLFOX, completed 06/01/20, oxaliplatin held for final 2 cycles due to neuropathy.  -s/p BSO and hysterectomy on 12/22/21 with Dr. Pricilla Holm. Path revealed metastatic moderately differentiated colonic adenocarcinoma involving both ovaries and peritoneum -she restarted FOLFOX on 01/24/22. -Due to disease progression, chemotherapy changed to second line FOLFIRI and bevacizumab on April 06, 2022. She has been tolerating moderately well, better now with dose reduction, will continue -Restaging CT scan from 09/27/2022 showed continous response in liver and peritoneal metastasis, no other new lesions.   -We discussed maintenance therapy option in future  -due to her PE in 10/2022, will hold beva for 3 months  -restaging CT 12/31/2022 showed stable disease and live met is not well defined on CT. She restarted beva on 01/17/23   Malignant neoplasm of female breast [facility]) -Dx in 05/2014, s/p left lumpectomy with Dr Johna Sheriff, adjuvant RT with Dr [name], and Anastrozole 08/2014 - 01/13/20 under Dr Pamelia Hoit.  -DEXA 09/11/20 T score +1.2 normal.  -most recent  mammogram on 10/31/2022 was negative.     Assessment and Plan    Metastatic Colon Cancer Tolerating chemotherapy well with manageable diarrhea and stable neuropathy. Hair loss noted but regrowth observed. No new symptoms reported. Hemoglobin stable at 10.4, white count slightly elevated but not concerning. -Continue current chemotherapy regimen. -Order restaging CT scan for December   Hypokalemia Request for potassium refill. -Refill potassium prescription  Diabetes Last reported A1c was 6.7, indicating good control. -Continue current management.  Plan -Lab reviewed, adequate for treatment, will proceed chemo at the same dose today -Follow-up in 2 weeks before next cycle chemotherapy, will order CT scan on next visit       SUMMARY OF ONCOLOGIC HISTORY: Oncology History Overview Note   Cancer Staging  Malignant neoplasm of female breast [facility]) Staging form: Breast, AJCC 7th Edition - Clinical stage from 06/11/2014: Stage 0 (Tis (DCIS), N0, M0) - Unsigned Staged by: Pathologist and managing physician Laterality: Left Estrogen receptor status: Positive Progesterone receptor status: Positive Stage used in treatment planning: Yes National guidelines used in treatment planning: Yes Type of national guideline used in treatment planning: NCCN - Pathologic stage from 07/03/2014: Stage Unknown (Tis (DCIS), NX, cM0) - Signed by [name], MD on 07/10/2014 Staged by: Pathologist Laterality: Left Estrogen receptor status: Positive Progesterone receptor status: Positive Stage used in treatment planning: Yes National guidelines used in treatment planning: Yes Type of national guideline used in treatment planning: NCCN Staging comments: Staged on final lumpectomy specimen by Dr. [name].  Right colon cancer Staging form: Colon and Rectum, AJCC 8th Edition - Pathologic stage from 11/01/2019: Stage IIIB (pT3, pN2a, cM0) - Signed by [name], MD on 11/29/2019 Stage prefix: Initial  diagnosis Histologic grading  system: 4 grade system Histologic grade (G): G2 Residual tumor (R): R0 - None Tumor deposits (TD): Absent Perineural invasion (PNI): Absent Microsatellite instability (MSI): Stable KRAS mutation: Unknown NRAS mutation: Unknown BRAF mutation: Unknown     Malignant neoplasm of female breast (HCC)  05/29/2014 Initial Biopsy   Left breast needle core biopsy: Grade 2, DCIS with calcs. ER+ (100%), PR+ (96%).    06/04/2014 Initial Diagnosis   Left breast DCIS with calcifications, ER 100%, PR 96%   06/10/2014 Breast MRI   Left breast: 2.4 x 1.3 x 1.1 cm area of patchy non-mass enhancement upper outer quadrant includes postbiopsy seroma; Right breast: 1.2 cm previously biopsied stable benign fibroadenoma   06/12/2014 Procedure   Genetic counseling/testing: Identified 1 VUS on CHEK2 gene. Remainder of 17 gene panel tested negative and included: ATM, BARD1, BRCA 1/2, BRIP1, CDH1, CHEK2, EPCAM, MLH1, MSH2, MSH6, NBN, NF1, PALB2, PTEN, RAD50, RAD51C, RAD51D, STK11, and TP53.    07/01/2014 Surgery   Left breast lumpectomy ([facility]): Grade 1, DCIS, spanning 2.3 cm, 1 mm margin, ER 100%, PR 96%   07/31/2014 - 08/28/2014 Radiation Therapy   Adjuvant RT completed [name]). Left breast: Total dose 42.5 Gy over 17 fractions. Left breast boost: Total dose 7.5 Gy over 3 fractions.    09/14/2014 - 01/13/2020 Anti-estrogen oral therapy   Anastrazole 1mg  daily. Planned duration of treatment: 5 years [place]). Completed in 01/2020.    09/25/2014 Survivorship   Survivorship Care Plan given to patient and reviewed with her in person.    03/02/2021 Imaging   CT CAP  IMPRESSION: 1. No findings of active/recurrent malignancy. Partial right hemicolectomy. 2. Endometrial stripe remains mildly thickened, but endometrial biopsy in May was negative for malignancy. 3. Progressive endplate sclerosis and endplate irregularity at T2-3, probably due to degenerative endplate findings. If the  has referable upper thoracic pain/symptoms then thoracic spine MRI could be used for further workup. 4. Other imaging findings of potential clinical significance: Mild cardiomegaly. Aortic Atherosclerosis (ICD10-I70.0). Mild mitral valve calcification. Postoperative findings in the left breast with adjacent radiation port anteriorly in the left upper lobe. Tiny pulmonary nodules in the left lower lobe are unchanged from earliest available comparison of 10/17/2019 and probably benign although may merit surveillance. Multilevel lumbar impingement. Mild pelvic floor laxity.   Malignant neoplasm of ascending colon (HCC)  09/19/2019 Imaging   CT AP W contrast 09/19/19  IMPRESSION Fullness in the cecum, cannot exclude a mass. No evidence for metastatic disease is identified.    09/24/2019 Procedure   Colonoscopy by Dr [name] 09/24/19 IMPRESSION 1. The colon was redundant  2. Mild diverticulosis was noted through the entire examined colon 3. Single 12mm polyp was found in the ascending colon; polypectomy was performed using snare cautery and biopsy forceps 4. Mild diverticulosis was notes in the descending colon and sigmoid colon.  5. Single polyp was found in the sigmoid colon, polypectomy was performed with cold forceps.  6. Single polyp was found in the rectosigmoid colon; polypectomy was performed with cold snare  7. Small internal hemorrhoids  8. Large mass was found at the cecum; multiple biopsies of the area were performed using cold forceps; injection (tattooing) was performed distal to the mass.    09/24/2019 Initial Biopsy   INTERPRETATION AND DIAGNOSIS:  A. Cecum, biopsy:  Invasive moderately differentiated adenocarcinoma.  see comment  B. Polyp @ ascending colon, polypectomy:  Tubular Adenoma  C. Polyp @ sigmoid colon Polypectomy:  hyperplastic polyp.  D. Polyp @ rectosigmoid colon, Polypectomy:  Hyperplastic Polyp      10/16/2019 Imaging   CT Chest IMPRESSION: 1. Multiple  small pulmonary nodules measuring 5 mm or less in size in the lungs. These are nonspecific and are typically considered statistically likely benign. However, given the patient's history of primary malignancy, close attention on follow-up studies is recommended to ensure stability. 2. Aortic atherosclerosis, in addition to right coronary artery disease. Assessment for potential risk factor modification, dietary therapy or pharmacologic therapy may be warranted, if clinically indicated. 3. There are calcifications of the aortic valve and mitral annulus. Echocardiographic correlation for evaluation of potential valvular dysfunction may be warranted if clinically indicated. 4. Small hiatal hernia.   Aortic Atherosclerosis (ICD10-I70.0).   11/01/2019 Initial Diagnosis   Colon cancer (HCC)   11/01/2019 Surgery   LAPAROSCOPIC PARTIAL COLECTOMY by Dr Maisie Fus and Dr Michaell Cowing   11/01/2019 Pathology Results   FINAL MICROSCOPIC DIAGNOSIS:   A. COLON, PROXIMAL RIGHT, COLECTOMY:  - Invasive colonic adenocarcinoma, 5 cm.  - Tumor invades through the muscularis propria into pericolonic tissues.   - Margins of resection are not involved.  - Metastatic carcinoma in (5) of (13) lymph nodes.  - See oncology table.    MSI Stable  Mismatch repair normal  MLH1 - Preserved nuclear expression (greater 50% tumor expression) MSH2 - Preserved nuclear expression (greater 50% tumor expression) MSH6 - Preserved nuclear expression (greater 50% tumor expression) PMS2 - Preserved nuclear expression (greater 50% tumor expression)   11/01/2019 Cancer Staging   Staging form: Colon and Rectum, AJCC 8th Edition - Pathologic stage from 11/01/2019: Stage IIIB (pT3, pN2a, cM0) - Signed by [name], MD on 11/29/2019   12/10/2019 Procedure   PAC placed 12/10/19   12/17/2019 - 06/01/2020 Chemotherapy   FOLFOX q2weeks starting in 2 weeks starting 12/17/19. Held 01/27/20-02/10/20 due to b/l PE. Oxaliplatin held C11-12 due to  neuropathy. Completed on 06/01/20   03/02/2021 Imaging   CT CAP  IMPRESSION: 1. No findings of active/recurrent malignancy. Partial right hemicolectomy. 2. Endometrial stripe remains mildly thickened, but endometrial biopsy in May was negative for malignancy. 3. Progressive endplate sclerosis and endplate irregularity at T2-3, probably due to degenerative endplate findings. If the has referable upper thoracic pain/symptoms then thoracic spine MRI could be used for further workup. 4. Other imaging findings of potential clinical significance: Mild cardiomegaly. Aortic Atherosclerosis (ICD10-I70.0). Mild mitral valve calcification. Postoperative findings in the left breast with adjacent radiation port anteriorly in the left upper lobe. Tiny pulmonary nodules in the left lower lobe are unchanged from earliest available comparison of 10/17/2019 and probably benign although may merit surveillance. Multilevel lumbar impingement. Mild pelvic floor laxity.   08/26/2021 Imaging   EXAM: CT CHEST, ABDOMEN, AND PELVIS WITH CONTRAST  IMPRESSION: 1. Stable examination without new or progressive findings to suggest local recurrence or metastatic disease within the chest, abdomen, or pelvis. 2. Hepatomegaly with hepatic steatosis. 3. Sigmoid colonic diverticulosis without findings of acute diverticulitis. 4. Similar prominent endplate sclerosis and irregularity at T2-T3 is most consistent with Modic type endplate degenerative changes. However, if patient has referable upper thoracic pain consider further workup with thoracic spine MRI. 5. Similar thickening of the endometrial stripe measuring up to 8 mm, which was previously biopsied with results negative for malignancy. 6. Aortic Atherosclerosis (ICD10-I70.0).   11/10/2021 PET scan   IMPRESSION: 1. LEFT ovary is increased in size and is intensely hypermetabolic. While physiologic hypermetabolic ovarian tissue is not uncommon, the enlargement and  asymmetric activity warrants further evaluation. Consider contrast pelvic  MRI vs tissue sampling. 2. No evidence of metastatic colorectal carcinoma otherwise. 3. Post RIGHT hemicolectomy anatomy. 4. Evidence of radiation change in the LEFT upper lobe (remote breast cancer).     12/22/2021 Relapse/Recurrence    FINAL MICROSCOPIC DIAGNOSIS:   A. LEFT OVARY AND FALLOPIAN TUBE, SALPINGO OOPHORECTOMY:  - Metastatic moderately differentiated colonic adenocarcinoma involving left ovary  - Focal ovarian stromal calcification  - Segment of benign fallopian tube   B. UTERUS WITH RIGHT FALLOPIAN TUBE AND OVARY, HYSTERECTOMY AND SALPINGO-OOPHORECTOMY:  - Metastatic moderately differentiated colonic adenocarcinoma involving right ovary  - Focal invasive extensive adenomyosis  - Benign endometrial polyps  - Benign proliferative phase endometrium  - Hydrosalpinx of right fallopian tube  - Focal ovarian stromal calcification   C. PERITONEAL DEPOSITS, ANTERIOR CUL DE SAC, BIOPSY:  - Metastatic mucinous adenocarcinoma, consistent with colorectal primary    COMMENT:  Immunohistochemical stains show that the tumor cells are positive for CK20 and CDX2 while they are negative for CK7 and PAX8, consistent with above interpretation.    01/24/2022 - 03/23/2022 Chemotherapy   Patient is on Treatment Plan : COLORECTAL FOLFOX q14d x 3 months     04/01/2022 Imaging   CT AP IMPRESSION: 1. New omental metastatic disease. 2. Vague hypoattenuating lesion in the inferior right hepatic lobe is new and also worrisome for metastatic disease. 3. Similar small right lower lobe nodules. Recommend continued attention on follow-up. 4. Steatotic enlarged liver. 5.  Aortic atherosclerosis (ICD10-I70.0).   04/06/2022 -  Chemotherapy   Patient is on Treatment Plan : COLORECTAL FOLFIRI + Bevacizumab q14d     04/21/2022 Imaging    IMPRESSION: 1. Stable chest CT. No evidence of metastatic disease. 2. Stable small  pulmonary nodules, considered benign based on stability. 3. Stable postsurgical changes in the left breast and anterior left upper lobe radiation changes. 4. Aortic Atherosclerosis (ICD10-I70.0) and Emphysema (ICD10-J43.9).   04/21/2022 Imaging    IMPRESSION: 1. Small enhancing lesion inferiorly in the right hepatic lobe is typical of metastatic disease and stable from recent abdominal CT. No other definite liver lesions are identified. Mild hepatic steatosis. 2. No other significant abdominal findings. 3. Omental nodularity seen on CT is not well visualized by MRI.    Imaging     06/23/2022 Imaging    IMPRESSION: 1. Hypodense lesion of the inferior right lobe of the liver, hepatic segment VI is diminished in size, consistent with treatment response. 2. Tiny peritoneal and omental nodules identified by prior examination are diminished in size, consistent with treatment response. 3. Multiple tiny pulmonary nodules unchanged, most likely benign and incidental, however continued attention on follow-up warranted in the setting of known metastatic disease. 4. No evidence of new metastatic disease in the chest, abdomen, or pelvis. 5. Status post partial right hemicolectomy and reanastomosis. 6. Diffuse mosaic attenuation of the airspaces, consistent with small airways disease. 7. Hepatomegaly.      Discussed the use of AI scribe software for clinical note transcription with the patient, who gave verbal consent to proceed.  History of Present Illness   The patient, a 77 year old with metastatic colon cancer, presents for a routine follow-up after the last cycle of chemotherapy. She reports no new issues since the last cycle and tolerated the treatment well. Diarrhea, a common side effect of chemotherapy, is manageable. The patient also reports neuropathy, which remains unchanged. She has experienced significant hair loss due to chemotherapy, but it is now regrowing. The patient's  energy levels are good, allowing her to  remain active at home. She reports no recent swelling and her breathing is normal. The patient also mentions a slight itchiness around her port but no other issues.         All other systems were reviewed with the patient and are negative.  MEDICAL HISTORY:  Past Medical History:  Diagnosis Date   Aortic atherosclerosis (HCC)    Arthritis    feet, lower back   Basal cell carcinoma    arm   Breast cancer of upper-outer quadrant of left female breast (HCC) 06/04/2014   Cataract    immature on the left   Colon cancer (HCC) 08/2019   Diabetes mellitus without complication (HCC)    Diverticulosis    Dizziness    > 97yrs ago;took Antivert    Family history of anesthesia complication    sister slow to wake up with anesthesia   Family history of breast cancer    Family history of colon cancer    Family history of uterine cancer    GERD (gastroesophageal reflux disease)    takes occasional TUMs   History of bronchitis    > 75yrs ago   History of colon polyps    History of hiatal hernia    Small noted on CT   History of pulmonary embolus (PE)    Hypertension    takes Losartan daily and HCTZ   Iron deficiency anemia    Joint pain    Numbness    to toes on each foot   Peripheral neuropathy    feet and toes   Personal history of radiation therapy    Pulmonary nodules    Noted on CT   Radiation 07/31/14-08/28/14   Left Breast 20 fxs   Seasonal allergies    takes Claritin prn   Urinary frequency    Vitamin D deficiency    takes VIt D daily    SURGICAL HISTORY: Past Surgical History:  Procedure Laterality Date   BREAST BIOPSY Bilateral    BREAST LUMPECTOMY Left    BREAST LUMPECTOMY WITH RADIOACTIVE SEED LOCALIZATION Left 07/01/2014   Procedure: LEFT BREAST LUMPECTOMY WITH RADIOACTIVE SEED LOCALIZATION;  Surgeon: Glenna Fellows, MD;  Location: [facility];  Service: General;  Laterality: Left;   CATARACT EXTRACTION  Right    COLONOSCOPY  09/24/2019   [place]   LAPAROSCOPIC PARTIAL COLECTOMY N/A 11/01/2019   Procedure: LAPAROSCOPIC PARTIAL COLECTOMY;  Surgeon: [name], MD;  Location: [facility];  Service: General;  Laterality: N/A;   POLYPECTOMY     PORTACATH PLACEMENT N/A 12/10/2019   Procedure: INSERTION PORT-A-CATH ULTRASOUND GUIDED IN RIGHT IJ;  Surgeon: [name], MD;  Location: [facility];  Service: General;  Laterality: N/A;   ROBOTIC ASSISTED TOTAL HYSTERECTOMY Bilateral 12/22/2021   Procedure: XI ROBOTIC ASSISTED TOTAL HYSTERECTOMY WITH BILATERAL SALPINGO OOPHORECTOMY;  Surgeon: Carver Fila, MD;  Location: [facility];  Service: Gynecology;  Laterality: Bilateral;   TOTAL KNEE ARTHROPLASTY Left 10/24/2012   Procedure: TOTAL KNEE ARTHROPLASTY;  Surgeon: Nestor Lewandowsky, MD;  Location: [facility];  Service: Orthopedics;  Laterality: Left;   TOTAL KNEE ARTHROPLASTY Right 01/09/2013   Procedure: TOTAL KNEE ARTHROPLASTY;  Surgeon: Nestor Lewandowsky, MD;  Location: [facility];  Service: Orthopedics;  Laterality: Right;   TUBAL LIGATION      I have reviewed the social history and family history with the patient and they are unchanged from previous note.  ALLERGIES:  is allergic to oxycodone.  MEDICATIONS:  Current Outpatient Medications  Medication  Sig Dispense Refill   b complex vitamins capsule Take 1 capsule by mouth daily.     Cholecalciferol (VITAMIN D) 2000 UNITS CAPS Take 2,000 Units by mouth daily.      ELIQUIS 5 MG TABS tablet Take 1 tablet by mouth twice daily 60 tablet 0   ibuprofen (ADVIL) 200 MG tablet Take 200 mg by mouth daily as needed (pain).     lidocaine-prilocaine (EMLA) cream Apply 1 Application topically as needed. 30 g 0   loratadine (CLARITIN) 10 MG tablet Take 10 mg by mouth daily as needed for allergies.     losartan (COZAAR) 25 MG tablet Take 1 tablet (25 mg total) by mouth daily. 90 tablet 3   metFORMIN (GLUCOPHAGE) 500 MG tablet Take 1 tablet (500 mg total) by mouth in the  morning and at bedtime. 180 tablet 3   Multiple Vitamins-Minerals (MULTIVITAMIN WITH MINERALS) tablet Take 1 tablet by mouth daily.     omeprazole (PRILOSEC) 20 MG capsule Take 20 mg by mouth daily before breakfast.      ondansetron (ZOFRAN) 8 MG tablet Take 1 tablet (8 mg total) by mouth every 8 (eight) hours as needed for nausea or vomiting. Start on the third day after chemotherapy. 30 tablet 1   potassium chloride (KLOR-CON) 10 MEQ tablet Take 1 tablet (10 mEq total) by mouth daily. 30 tablet 0   pravastatin (PRAVACHOL) 10 MG tablet Take 1 tablet (10 mg total) by mouth daily. 90 tablet 3   pregabalin (LYRICA) 25 MG capsule Take 1 capsule (25 mg total) by mouth every morning. Take in addition to 50 mg at bedtime 60 capsule 2   pregabalin (LYRICA) 50 MG capsule Take 1 capsule (50 mg total) by mouth at bedtime. Take in addition to 25 mg in the morning 60 capsule 2   prochlorperazine (COMPAZINE) 10 MG tablet Take 1 tablet (10 mg total) by mouth every 6 (six) hours as needed for nausea or vomiting. 30 tablet 1   triamcinolone ointment (KENALOG) 0.5 % Apply 1 Application topically 2 (two) times daily. 30 g 0   No current facility-administered medications for this visit.   Facility-Administered Medications Ordered in Other Visits  Medication Dose Route Frequency Provider Last Rate Last Admin   atropine injection 0.5 mg  0.5 mg Intravenous Once PRN [name], MD       fluorouracil (ADRUCIL) 5,000 mg in sodium chloride 0.9 % 150 mL chemo infusion  2,400 mg/m2 (Treatment Plan Recorded) Intravenous 1 day or 1 dose [name], MD       irinotecan (CAMPTOSAR) 220 mg in sodium chloride 0.9 % 500 mL chemo infusion  100 mg/m2 (Treatment Plan Recorded) Intravenous Once [name], MD 341 mL/hr at 02/13/23 1246 220 mg at 02/13/23 1246   leucovorin 896 mg in sodium chloride 0.9 % 250 mL infusion  400 mg/m2 (Treatment Plan Recorded) Intravenous Once [name], MD 197 mL/hr at 02/13/23 1241 896 mg at 02/13/23 1241    sodium chloride flush (NS) 0.9 % injection 10 mL  10 mL Intracatheter PRN [name], MD        PHYSICAL EXAMINATION: ECOG PERFORMANCE STATUS: 1 - Symptomatic but completely ambulatory  Vitals:   02/13/23 1004  BP: (!) 145/54  Pulse: (!) 57  Resp: 18  Temp: 98.3 F (36.8 C)  SpO2: 100%   Wt Readings from Last 3 Encounters:  02/13/23 214 lb 6.4 oz (97.3 kg)  01/30/23 212 lb 3.2 oz (96.3 kg)  01/16/23 212 lb 14.4  oz (96.6 kg)     GENERAL:alert, no distress and comfortable SKIN: skin color, texture, turgor are normal, no rashes or significant lesions EYES: normal, Conjunctiva are pink and non-injected, sclera clear NECK: supple, thyroid normal size, non-tender, without nodularity LYMPH:  no palpable lymphadenopathy in the cervical, axillary  LUNGS: clear to auscultation and percussion with normal breathing effort HEART: regular rate & rhythm and no murmurs and no lower extremity edema ABDOMEN:abdomen soft, non-tender and normal bowel sounds Musculoskeletal:no cyanosis of digits and no clubbing  NEURO: alert & oriented x 3 with fluent speech, no focal motor/sensory deficits   LABORATORY DATA:  I have reviewed the data as listed    Latest Ref Rng & Units 02/13/2023    9:36 AM 01/30/2023    8:41 AM 01/16/2023    8:51 AM  CBC  WBC 4.0 - 10.5 K/uL 11.0  17.6  9.1   Hemoglobin 12.0 - 15.0 g/dL 09.8  11.9  14.7   Hematocrit 36.0 - 46.0 % 33.2  33.9  33.2   Platelets 150 - 400 K/uL 207  240  220         Latest Ref Rng & Units 02/13/2023    9:36 AM 01/30/2023    8:41 AM 01/16/2023    8:51 AM  CMP  Glucose 70 - 99 mg/dL 829  562  97   BUN 8 - 23 mg/dL 14  14  12    Creatinine 0.44 - 1.00 mg/dL 1.30  8.65  7.84   Sodium 135 - 145 mmol/L 140  142  145   Potassium 3.5 - 5.1 mmol/L 4.4  4.2  3.8   Chloride 98 - 111 mmol/L 109  109  111   CO2 22 - 32 mmol/L 26  25  28    Calcium 8.9 - 10.3 mg/dL 9.0  9.3  9.2   Total Protein 6.5 - 8.1 g/dL 6.3  6.3  6.1   Total Bilirubin 0.3 -  1.2 mg/dL 0.8  0.7  0.9   Alkaline Phos 38 - 126 U/L 131  146  128   AST 15 - 41 U/L 23  27  16    ALT 0 - 44 U/L 18  16  12        RADIOGRAPHIC STUDIES: I have personally reviewed the radiological images as listed and agreed with the findings in the report. No results found.    Orders Placed This Encounter  Procedures   Total Protein, Urine dipstick    Standing Status:   Standing    Number of Occurrences:   20    Standing Expiration Date:   02/13/2024   All questions were answered. The patient knows to call the clinic with any problems, questions or concerns. No barriers to learning was detected. The total time spent in the appointment was 25 minutes.     [name], MD 02/13/2023

## 2023-02-13 NOTE — Patient Instructions (Signed)
[facility] [facility]  Discharge Instructions: Thank you for choosing [facility] to provide your oncology and hematology care.   If you have a lab appointment with the Cancer Center, please go directly to the Cancer Center and check in at the registration area.   Wear comfortable clothing and clothing appropriate for easy access to any Portacath or PICC line.   We strive to give you quality time with your provider. You may need to reschedule your appointment if you arrive late (15 or more minutes).  Arriving late affects you and other patients whose appointments are after yours.  Also, if you miss three or more appointments without notifying the office, you may be dismissed from the clinic at the provider's discretion.      For prescription refill requests, have your pharmacy contact our office and allow 72 hours for refills to be completed.    Today you received the following chemotherapy and/or immunotherapy agents Mvasi / Irinotecan / Leucovorin / Fluorouracil      To help prevent nausea and vomiting after your treatment, we encourage you to take your nausea medication as directed.  BELOW ARE SYMPTOMS THAT SHOULD BE REPORTED IMMEDIATELY: *FEVER GREATER THAN 100.4 F (38 C) OR HIGHER *CHILLS OR SWEATING *NAUSEA AND VOMITING THAT IS NOT CONTROLLED WITH YOUR NAUSEA MEDICATION *UNUSUAL SHORTNESS OF BREATH *UNUSUAL BRUISING OR BLEEDING *URINARY PROBLEMS (pain or burning when urinating, or frequent urination) *BOWEL PROBLEMS (unusual diarrhea, constipation, pain near the anus) TENDERNESS IN MOUTH AND THROAT WITH OR WITHOUT PRESENCE OF ULCERS (sore throat, sores in mouth, or a toothache) UNUSUAL RASH, SWELLING OR PAIN  UNUSUAL VAGINAL DISCHARGE OR ITCHING   Items with * indicate a potential emergency and should be followed up as soon as possible or go to the Emergency Department if any problems should occur.  Please show the CHEMOTHERAPY ALERT CARD  or IMMUNOTHERAPY ALERT CARD at check-in to the Emergency Department and triage nurse.  Should you have questions after your visit or need to cancel or reschedule your appointment, please contact [place] [facility]  Dept: [phone]  and follow the prompts.  Office hours are 8:00 a.m. to 4:30 p.m. Monday - Friday. Please note that voicemails left after 4:00 p.m. may not be returned until the following business day.  We are closed weekends and major holidays. You have access to a nurse at all times for urgent questions. Please call the main number to the clinic Dept: [phone] and follow the prompts.   For any non-urgent questions, you may also contact your provider using MyChart. We now offer e-Visits for anyone 58 and older to request care online for non-urgent symptoms. For details visit [email or web address].   Also download the MyChart app! Go to the app store, search "MyChart", open the app, select [place], and log in with your MyChart username and password.

## 2023-02-15 ENCOUNTER — Inpatient Hospital Stay: Payer: Medicare Other

## 2023-02-15 DIAGNOSIS — Z5189 Encounter for other specified aftercare: Secondary | ICD-10-CM | POA: Diagnosis not present

## 2023-02-15 DIAGNOSIS — Z90710 Acquired absence of both cervix and uterus: Secondary | ICD-10-CM | POA: Diagnosis not present

## 2023-02-15 DIAGNOSIS — Z90722 Acquired absence of ovaries, bilateral: Secondary | ICD-10-CM | POA: Diagnosis not present

## 2023-02-15 DIAGNOSIS — Z853 Personal history of malignant neoplasm of breast: Secondary | ICD-10-CM | POA: Diagnosis not present

## 2023-02-15 DIAGNOSIS — C787 Secondary malignant neoplasm of liver and intrahepatic bile duct: Secondary | ICD-10-CM | POA: Diagnosis not present

## 2023-02-15 DIAGNOSIS — C182 Malignant neoplasm of ascending colon: Secondary | ICD-10-CM

## 2023-02-15 DIAGNOSIS — C786 Secondary malignant neoplasm of retroperitoneum and peritoneum: Secondary | ICD-10-CM | POA: Diagnosis not present

## 2023-02-15 DIAGNOSIS — E1142 Type 2 diabetes mellitus with diabetic polyneuropathy: Secondary | ICD-10-CM | POA: Diagnosis not present

## 2023-02-15 DIAGNOSIS — Z5111 Encounter for antineoplastic chemotherapy: Secondary | ICD-10-CM | POA: Diagnosis not present

## 2023-02-15 DIAGNOSIS — C7963 Secondary malignant neoplasm of bilateral ovaries: Secondary | ICD-10-CM | POA: Diagnosis not present

## 2023-02-15 DIAGNOSIS — E876 Hypokalemia: Secondary | ICD-10-CM | POA: Diagnosis not present

## 2023-02-15 MED ORDER — HEPARIN SOD (PORK) LOCK FLUSH 100 UNIT/ML IV SOLN
500.0000 [IU] | Freq: Once | INTRAVENOUS | Status: AC | PRN
  Administered 2023-02-15: 500 [IU]

## 2023-02-15 MED ORDER — SODIUM CHLORIDE 0.9% FLUSH
10.0000 mL | INTRAVENOUS | Status: DC | PRN
  Administered 2023-02-15: 10 mL

## 2023-02-15 MED ORDER — PEGFILGRASTIM-CBQV 6 MG/0.6ML ~~LOC~~ SOSY
6.0000 mg | PREFILLED_SYRINGE | Freq: Once | SUBCUTANEOUS | Status: AC
  Administered 2023-02-15: 6 mg via SUBCUTANEOUS
  Filled 2023-02-15: qty 0.6

## 2023-02-23 ENCOUNTER — Ambulatory Visit: Payer: Medicare Other | Admitting: Family Medicine

## 2023-02-23 ENCOUNTER — Encounter: Payer: Self-pay | Admitting: Family Medicine

## 2023-02-23 VITALS — BP 124/80 | HR 69 | Temp 98.0°F | Ht 65.5 in | Wt 208.4 lb

## 2023-02-23 DIAGNOSIS — E7849 Other hyperlipidemia: Secondary | ICD-10-CM

## 2023-02-23 DIAGNOSIS — C182 Malignant neoplasm of ascending colon: Secondary | ICD-10-CM | POA: Diagnosis not present

## 2023-02-23 DIAGNOSIS — E1169 Type 2 diabetes mellitus with other specified complication: Secondary | ICD-10-CM

## 2023-02-23 DIAGNOSIS — I10 Essential (primary) hypertension: Secondary | ICD-10-CM | POA: Diagnosis not present

## 2023-02-23 DIAGNOSIS — C786 Secondary malignant neoplasm of retroperitoneum and peritoneum: Secondary | ICD-10-CM

## 2023-02-23 DIAGNOSIS — Z23 Encounter for immunization: Secondary | ICD-10-CM

## 2023-02-23 DIAGNOSIS — E785 Hyperlipidemia, unspecified: Secondary | ICD-10-CM

## 2023-02-23 DIAGNOSIS — Z7984 Long term (current) use of oral hypoglycemic drugs: Secondary | ICD-10-CM

## 2023-02-23 LAB — HEMOGLOBIN A1C: Hgb A1c MFr Bld: 6.5 % (ref 4.6–6.5)

## 2023-02-23 NOTE — Assessment & Plan Note (Signed)
Continue chemotherapy and following with oncology.

## 2023-02-23 NOTE — Addendum Note (Signed)
Addended by: Waymond Cera on: 02/23/2023 08:59 AM   Modules accepted: Orders

## 2023-02-23 NOTE — Progress Notes (Signed)
[facility] [address] [place] [address] Dept: [phone] Dept Fax: [phone]  Chronic Care Office Visit  Subjective:    Patient ID: Kim Castro, female    DOB: 02-Feb-1946, 77 y.o..   MRN: [id number]  Chief Complaint  Patient presents with   Diabetes    3 month f/u.     History of Present Illness:  Patient is in today for reassessment of chronic medical issues.  Ms. Schank has a history of type 2 diabetes. She is managed on metformin 500 mg bid.    Ms. Ganzer has a history of hypertension. She is managed on losartan 25 mg daily.   Ms. Stiff has a history of hyperlipidemia. She is currently on pravastatin 10 mg daily.   Ms. Arcega has a history of both breast and colon cancer. Earlier in 2023, Ms. Copes was found to have metastatic colon cancer throughout the peritoneal cavity, esp. involving both ovaries. She underwent a total hysterectomy. She currently has a Port-a-cath in place and is receiving chemotherapy. She is receiving this every other week. In early June, she was noted to have pulmonary emboli. These were felt to be a side effect of her bevacizumab, which has been stopped. She is now on apixaban, which will continue until her chemotherapies are stopped. Her oncologist has been following liver lesions which apparently are improving. Ms. Tensley notes a plan to continue chemotherapy until she has completed the total planned accumulative doses.  Past Medical History: Patient Active Problem List   Diagnosis Date Noted   Pulmonary embolism (HCC) 10/02/2022   Macrocytic anemia 10/02/2022   Leukocytosis 10/02/2022   Metastasis to peritoneal cavity (HCC) 01/12/2022   Nausea without vomiting 12/23/2021   Thickened endometrium    Essential hypertension 05/12/2021   Vitamin D deficiency 05/12/2021   Personal history of malignant neoplasm of breast 05/12/2021   History of colonic polyps 05/12/2021   Morbid  obesity (HCC) 05/12/2021   Hyperlipidemia 05/12/2021   Hypercalcemia 05/12/2021   Allergic rhinitis 05/12/2021   Knee joint replacement status, bilateral 05/12/2021   Peripheral neuropathy due to chemotherapy (HCC) 05/12/2021   Aortic atherosclerosis (HCC) 05/12/2021   Gastroesophageal reflux disease    Type 2 diabetes mellitus with hyperlipidemia (HCC)    History of pulmonary embolism 01/27/2020   Port-A-Cath in place 12/30/2019   Malignant neoplasm of ascending colon (HCC) 11/01/2019   Genetic testing 07/08/2014   Malignant neoplasm of female breast (HCC) 06/04/2014   Past Surgical History:  Procedure Laterality Date   BREAST BIOPSY Bilateral    BREAST LUMPECTOMY Left    BREAST LUMPECTOMY WITH RADIOACTIVE SEED LOCALIZATION Left 07/01/2014   Procedure: LEFT BREAST LUMPECTOMY WITH RADIOACTIVE SEED LOCALIZATION;  Surgeon: Glenna Fellows, MD;  Location: [facility];  Service: General;  Laterality: Left;   CATARACT EXTRACTION Right    COLONOSCOPY  09/24/2019   [place]   LAPAROSCOPIC PARTIAL COLECTOMY N/A 11/01/2019   Procedure: LAPAROSCOPIC PARTIAL COLECTOMY;  Surgeon: [name], MD;  Location: [facility];  Service: General;  Laterality: N/A;   POLYPECTOMY     PORTACATH PLACEMENT N/A 12/10/2019   Procedure: INSERTION PORT-A-CATH ULTRASOUND GUIDED IN RIGHT IJ;  Surgeon: [name], MD;  Location: [facility];  Service: General;  Laterality: N/A;   ROBOTIC ASSISTED TOTAL HYSTERECTOMY Bilateral 12/22/2021   Procedure: XI ROBOTIC ASSISTED TOTAL HYSTERECTOMY WITH BILATERAL SALPINGO OOPHORECTOMY;  Surgeon: Carver Fila, MD;  Location: [facility];  Service: Gynecology;  Laterality: Bilateral;  TOTAL KNEE ARTHROPLASTY Left 10/24/2012   Procedure: TOTAL KNEE ARTHROPLASTY;  Surgeon: Nestor Lewandowsky, MD;  Location: [facility];  Service: Orthopedics;  Laterality: Left;   TOTAL KNEE ARTHROPLASTY Right 01/09/2013   Procedure: TOTAL KNEE ARTHROPLASTY;  Surgeon: Nestor Lewandowsky, MD;   Location: [facility];  Service: Orthopedics;  Laterality: Right;   TUBAL LIGATION     Family History  Problem Relation Age of Onset   Diabetes Mother    Uterine cancer Mother        deceased 33   Hypertension Father    Heart disease Father    Diabetes Sister    Breast cancer Sister 30       currently 54   Hypertension Sister    Diabetes Sister    Breast cancer Sister    Diabetes Sister    Kidney disease Sister    Heart disease Sister    Lupus Sister    Heart disease Sister    Diabetes Sister    Breast cancer Sister    Diabetes Sister    Breast cancer Daughter    Colon polyps Daughter    Thyroid cancer Daughter 39       currently 69; type?   Cancer Maternal Uncle        unk. primary; deceased 61s   Stroke Maternal Grandmother    Hypertension Brother    Diabetes Brother    Colon cancer Brother 33   Esophageal cancer Neg Hx    Rectal cancer Neg Hx    Stomach cancer Neg Hx    Outpatient Medications Prior to Visit  Medication Sig Dispense Refill   b complex vitamins capsule Take 1 capsule by mouth daily.     Cholecalciferol (VITAMIN D) 2000 UNITS CAPS Take 2,000 Units by mouth daily.      ELIQUIS 5 MG TABS tablet Take 1 tablet by mouth twice daily 60 tablet 0   ibuprofen (ADVIL) 200 MG tablet Take 200 mg by mouth daily as needed (pain).     lidocaine-prilocaine (EMLA) cream Apply 1 Application topically as needed. 30 g 0   loratadine (CLARITIN) 10 MG tablet Take 10 mg by mouth daily as needed for allergies.     losartan (COZAAR) 25 MG tablet Take 1 tablet (25 mg total) by mouth daily. 90 tablet 3   metFORMIN (GLUCOPHAGE) 500 MG tablet Take 1 tablet (500 mg total) by mouth in the morning and at bedtime. 180 tablet 3   Multiple Vitamins-Minerals (MULTIVITAMIN WITH MINERALS) tablet Take 1 tablet by mouth daily.     omeprazole (PRILOSEC) 20 MG capsule Take 20 mg by mouth daily before breakfast.      ondansetron (ZOFRAN) 8 MG tablet Take 1 tablet (8 mg total) by mouth every 8  (eight) hours as needed for nausea or vomiting. Start on the third day after chemotherapy. 30 tablet 1   potassium chloride (KLOR-CON) 10 MEQ tablet Take 1 tablet (10 mEq total) by mouth daily. 30 tablet 0   pravastatin (PRAVACHOL) 10 MG tablet Take 1 tablet (10 mg total) by mouth daily. 90 tablet 3   pregabalin (LYRICA) 25 MG capsule Take 1 capsule (25 mg total) by mouth every morning. Take in addition to 50 mg at bedtime 60 capsule 2   pregabalin (LYRICA) 50 MG capsule Take 1 capsule (50 mg total) by mouth at bedtime. Take in addition to 25 mg in the morning 60 capsule 2   prochlorperazine (COMPAZINE) 10 MG tablet Take 1 tablet (10 mg  total) by mouth every 6 (six) hours as needed for nausea or vomiting. 30 tablet 1   triamcinolone ointment (KENALOG) 0.5 % Apply 1 Application topically 2 (two) times daily. 30 g 0   No facility-administered medications prior to visit.   Allergies  Allergen Reactions   Oxycodone Nausea And Vomiting   Objective:   Today's Vitals   02/23/23 0753  BP: 124/80  Pulse: 69  Temp: 98 F (36.7 C)  TempSrc: Temporal  SpO2: 99%  Weight: 208 lb 6.4 oz (94.5 kg)  Height: 5' 5.5" (1.664 m)   Body mass index is 34.15 kg/m.   General: Well developed, well nourished. No acute distress. Psych: Alert and oriented. Normal mood and affect.  Health Maintenance Due  Topic Date Due   OPHTHALMOLOGY EXAM  09/07/2022   INFLUENZA VACCINE  12/01/2022     Assessment & Plan:   Problem List Items Addressed This Visit       Cardiovascular and Mediastinum   Essential hypertension - Primary    Blood pressure is in good control. Continue losartan 25 mg daily.        Digestive   Malignant neoplasm of ascending colon (HCC)    Continue chemotherapy and following with oncology.        Endocrine   Type 2 diabetes mellitus with hyperlipidemia (HCC)    Will check A1c today. Continue metformin 500 mg bid.      Relevant Orders   Hemoglobin A1c     Other    Hyperlipidemia    Stable. Continue pravastatin 10 mg daily.      Metastasis to peritoneal cavity (HCC)    As above.       Return in about 3 months (around 05/26/2023) for Reassessment.   [name], MD

## 2023-02-23 NOTE — Assessment & Plan Note (Signed)
Stable.  Continue pravastatin 10mg daily

## 2023-02-23 NOTE — Assessment & Plan Note (Signed)
Blood pressure is in good control. Continue losartan 25 mg daily.

## 2023-02-23 NOTE — Assessment & Plan Note (Signed)
Will check A1c today. Continue metformin 500 mg bid.

## 2023-02-23 NOTE — Assessment & Plan Note (Signed)
As above.

## 2023-02-26 NOTE — Assessment & Plan Note (Signed)
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 Path showed overall Stage IIIB cancer.  -s/p 6 months adjuvant FOLFOX, completed 06/01/20, oxaliplatin held for final 2 cycles due to neuropathy.  -s/p BSO and hysterectomy on 12/22/21 with Dr. Pricilla Holm. Path revealed metastatic moderately differentiated colonic adenocarcinoma involving both ovaries and peritoneum -she restarted FOLFOX on 01/24/22. -Due to disease progression, chemotherapy changed to second line FOLFIRI and bevacizumab on April 06, 2022. She has been tolerating moderately well, better now with dose reduction, will continue -Restaging CT scan from 09/27/2022 showed continous response in liver and peritoneal metastasis, no other new lesions.   -We discussed maintenance therapy option in future  -due to her PE in 10/2022, will hold beva for 3 months  -restaging CT 12/31/2022 showed stable disease and live met is not well defined on CT. She restarted beva on 01/17/23

## 2023-02-26 NOTE — Assessment & Plan Note (Signed)
-  Dx in 05/2014, s/p left lumpectomy with Dr Johna Sheriff, adjuvant RT with Dr [name], and Anastrozole 08/2014-01/13/20   -DEXA 09/11/20 T score +1.2 normal.  -most recent mammogram on 10/31/2022 was negative.

## 2023-02-27 ENCOUNTER — Inpatient Hospital Stay: Payer: Medicare Other

## 2023-02-27 ENCOUNTER — Inpatient Hospital Stay: Payer: Medicare Other | Admitting: Hematology

## 2023-02-27 ENCOUNTER — Encounter: Payer: Self-pay | Admitting: Hematology

## 2023-02-27 VITALS — BP 132/58 | HR 69 | Temp 98.5°F | Resp 18 | Wt 213.1 lb

## 2023-02-27 VITALS — BP 136/88 | HR 60 | Temp 98.4°F | Resp 16

## 2023-02-27 DIAGNOSIS — E1142 Type 2 diabetes mellitus with diabetic polyneuropathy: Secondary | ICD-10-CM | POA: Diagnosis not present

## 2023-02-27 DIAGNOSIS — C50919 Malignant neoplasm of unspecified site of unspecified female breast: Secondary | ICD-10-CM

## 2023-02-27 DIAGNOSIS — C786 Secondary malignant neoplasm of retroperitoneum and peritoneum: Secondary | ICD-10-CM | POA: Diagnosis not present

## 2023-02-27 DIAGNOSIS — Z853 Personal history of malignant neoplasm of breast: Secondary | ICD-10-CM | POA: Diagnosis not present

## 2023-02-27 DIAGNOSIS — Z95828 Presence of other vascular implants and grafts: Secondary | ICD-10-CM

## 2023-02-27 DIAGNOSIS — Z90710 Acquired absence of both cervix and uterus: Secondary | ICD-10-CM | POA: Diagnosis not present

## 2023-02-27 DIAGNOSIS — Z5111 Encounter for antineoplastic chemotherapy: Secondary | ICD-10-CM | POA: Diagnosis not present

## 2023-02-27 DIAGNOSIS — C7963 Secondary malignant neoplasm of bilateral ovaries: Secondary | ICD-10-CM | POA: Diagnosis not present

## 2023-02-27 DIAGNOSIS — C787 Secondary malignant neoplasm of liver and intrahepatic bile duct: Secondary | ICD-10-CM | POA: Diagnosis not present

## 2023-02-27 DIAGNOSIS — C182 Malignant neoplasm of ascending colon: Secondary | ICD-10-CM

## 2023-02-27 DIAGNOSIS — Z5189 Encounter for other specified aftercare: Secondary | ICD-10-CM | POA: Diagnosis not present

## 2023-02-27 DIAGNOSIS — Z90722 Acquired absence of ovaries, bilateral: Secondary | ICD-10-CM | POA: Diagnosis not present

## 2023-02-27 DIAGNOSIS — E876 Hypokalemia: Secondary | ICD-10-CM | POA: Diagnosis not present

## 2023-02-27 DIAGNOSIS — Z170 Estrogen receptor positive status [ER+]: Secondary | ICD-10-CM

## 2023-02-27 LAB — CBC WITH DIFFERENTIAL (CANCER CENTER ONLY)
Abs Immature Granulocytes: 0.58 10*3/uL — ABNORMAL HIGH (ref 0.00–0.07)
Basophils Absolute: 0.1 10*3/uL (ref 0.0–0.1)
Basophils Relative: 1 %
Eosinophils Absolute: 0.2 10*3/uL (ref 0.0–0.5)
Eosinophils Relative: 1 %
HCT: 35.7 % — ABNORMAL LOW (ref 36.0–46.0)
Hemoglobin: 11 g/dL — ABNORMAL LOW (ref 12.0–15.0)
Immature Granulocytes: 3 %
Lymphocytes Relative: 18 %
Lymphs Abs: 3.1 10*3/uL (ref 0.7–4.0)
MCH: 32 pg (ref 26.0–34.0)
MCHC: 30.8 g/dL (ref 30.0–36.0)
MCV: 103.8 fL — ABNORMAL HIGH (ref 80.0–100.0)
Monocytes Absolute: 1.1 10*3/uL — ABNORMAL HIGH (ref 0.1–1.0)
Monocytes Relative: 7 %
Neutro Abs: 12 10*3/uL — ABNORMAL HIGH (ref 1.7–7.7)
Neutrophils Relative %: 70 %
Platelet Count: 257 10*3/uL (ref 150–400)
RBC: 3.44 MIL/uL — ABNORMAL LOW (ref 3.87–5.11)
RDW: 17.4 % — ABNORMAL HIGH (ref 11.5–15.5)
WBC Count: 17 10*3/uL — ABNORMAL HIGH (ref 4.0–10.5)
nRBC: 0.1 % (ref 0.0–0.2)

## 2023-02-27 LAB — CMP (CANCER CENTER ONLY)
ALT: 15 U/L (ref 0–44)
AST: 21 U/L (ref 15–41)
Albumin: 4 g/dL (ref 3.5–5.0)
Alkaline Phosphatase: 131 U/L — ABNORMAL HIGH (ref 38–126)
Anion gap: 7 (ref 5–15)
BUN: 23 mg/dL (ref 8–23)
CO2: 23 mmol/L (ref 22–32)
Calcium: 9.1 mg/dL (ref 8.9–10.3)
Chloride: 112 mmol/L — ABNORMAL HIGH (ref 98–111)
Creatinine: 0.71 mg/dL (ref 0.44–1.00)
GFR, Estimated: >60 mL/min (ref >60)
Glucose, Bld: 114 mg/dL — ABNORMAL HIGH (ref 70–99)
Potassium: 4 mmol/L (ref 3.5–5.1)
Sodium: 142 mmol/L (ref 135–145)
Total Bilirubin: 0.7 mg/dL (ref 0.3–1.2)
Total Protein: 6.4 g/dL — ABNORMAL LOW (ref 6.5–8.1)

## 2023-02-27 LAB — CEA (ACCESS): CEA (CHCC): 8.39 ng/mL — ABNORMAL HIGH (ref 0.00–5.00)

## 2023-02-27 MED ORDER — HEPARIN SOD (PORK) LOCK FLUSH 100 UNIT/ML IV SOLN
500.0000 [IU] | Freq: Once | INTRAVENOUS | Status: DC | PRN
Start: 2023-02-27 — End: 2023-02-27

## 2023-02-27 MED ORDER — PALONOSETRON HCL INJECTION 0.25 MG/5ML
0.2500 mg | Freq: Once | INTRAVENOUS | Status: AC
  Administered 2023-02-27: 0.25 mg via INTRAVENOUS
  Filled 2023-02-27: qty 5

## 2023-02-27 MED ORDER — SODIUM CHLORIDE 0.9 % IV SOLN
100.0000 mg/m2 | Freq: Once | INTRAVENOUS | Status: AC
  Administered 2023-02-27: 220 mg via INTRAVENOUS
  Filled 2023-02-27: qty 11

## 2023-02-27 MED ORDER — SODIUM CHLORIDE 0.9 % IV SOLN
400.0000 mg/m2 | Freq: Once | INTRAVENOUS | Status: AC
  Administered 2023-02-27: 896 mg via INTRAVENOUS
  Filled 2023-02-27: qty 44.8

## 2023-02-27 MED ORDER — SODIUM CHLORIDE 0.9 % IV SOLN
5.0000 mg/kg | Freq: Once | INTRAVENOUS | Status: AC
  Administered 2023-02-27: 500 mg via INTRAVENOUS
  Filled 2023-02-27: qty 4

## 2023-02-27 MED ORDER — SODIUM CHLORIDE 0.9% FLUSH
10.0000 mL | INTRAVENOUS | Status: DC | PRN
  Administered 2023-02-27: 10 mL

## 2023-02-27 MED ORDER — SODIUM CHLORIDE 0.9 % IV SOLN
2400.0000 mg/m2 | INTRAVENOUS | Status: DC
  Administered 2023-02-27: 5000 mg via INTRAVENOUS
  Filled 2023-02-27: qty 100

## 2023-02-27 MED ORDER — DEXAMETHASONE SODIUM PHOSPHATE 10 MG/ML IJ SOLN
10.0000 mg | Freq: Once | INTRAMUSCULAR | Status: AC
  Administered 2023-02-27: 10 mg via INTRAVENOUS
  Filled 2023-02-27: qty 1

## 2023-02-27 MED ORDER — ATROPINE SULFATE 1 MG/ML IV SOLN
0.5000 mg | Freq: Once | INTRAVENOUS | Status: DC | PRN
  Filled 2023-02-27: qty 1

## 2023-02-27 MED ORDER — SODIUM CHLORIDE 0.9 % IV SOLN: Freq: Once | INTRAVENOUS | Status: AC

## 2023-02-27 MED ORDER — SODIUM CHLORIDE 0.9% FLUSH: 10.0000 mL | INTRAVENOUS | Status: DC | PRN

## 2023-02-27 NOTE — Progress Notes (Signed)
[facility]   [phone] [phone]   Clinic Follow up Note   Patient Care Team: [name], MD as PCP - General (Family Medicine) [name], MD as Consulting Physician (General Surgery) [name], MD as Consulting Physician (Hematology) [name], MD as Consulting Physician (General Surgery) [name], MD as Consulting Physician (Hematology and Oncology)  Date of Service:  02/27/2023  CHIEF COMPLAINT: f/u of metastatic colon cancer  CURRENT THERAPY:  FOLFIRI and bevacizumab  Oncology History   Malignant neoplasm of ascending colon (HCC) pT3N2aM0 stage IIB, MSS, metastatic to b/l ovaries and peritoneum and liver, KRAS mutation G12V (+)  -Initially diagnosed in 08/2019, s/p resection with Dr Maisie Fus on 11/01/19. Path showed overall Stage IIIB cancer.  -s/p 6 months adjuvant FOLFOX, completed 06/01/20, oxaliplatin held for final 2 cycles due to neuropathy.  -s/p BSO and hysterectomy on 12/22/21 with Dr. Pricilla Holm. Path revealed metastatic moderately differentiated colonic adenocarcinoma involving both ovaries and peritoneum -she restarted FOLFOX on 01/24/22. -Due to disease progression, chemotherapy changed to second line FOLFIRI and bevacizumab on April 06, 2022. She has been tolerating moderately well, better now with dose reduction, will continue -Restaging CT scan from 09/27/2022 showed continous response in liver and peritoneal metastasis, no other new lesions.   -We discussed maintenance therapy option in future  -due to her PE in 10/2022, will hold beva for 3 months  -restaging CT 12/31/2022 showed stable disease and live met is not well defined on CT. She restarted beva on 01/17/23   Malignant neoplasm of female breast [facility]) -Dx in 05/2014, s/p left lumpectomy with Dr Johna Sheriff, adjuvant RT with Dr [name], and Anastrozole 08/2014-01/13/20   -DEXA 09/11/20 T score +1.2 normal.  -most recent mammogram on 10/31/2022 was negative.       Assessment and Plan    Metastatic Colon Cancer Stable disease with ongoing chemotherapy. No new symptoms related to disease progression. -Continue current chemotherapy regimen. -Schedule next scan for late November or early December.  Intermittent Diarrhea She has intermittent recurrent diarrhea, likely related to chemo.  She had again this morning with multiple bowel movements and one episode of vomiting. No fever or abdominal cramps. -Increase Imodium to 2 tablets as needed for diarrhea. -Encouraged to hydrate with IV fluids or Gatorade at home.  Plan -Lab reviewed, adequate for treatment, will proceed chemo today and continue every 2 weeks -Follow-up in 2 weeks, will order restaging scan on next visit       SUMMARY OF ONCOLOGIC HISTORY: Oncology History Overview Note   Cancer Staging  Malignant neoplasm of female breast [facility]) Staging form: Breast, AJCC 7th Edition - Clinical stage from 06/11/2014: Stage 0 (Tis (DCIS), N0, M0) - Unsigned Staged by: Pathologist and managing physician Laterality: Left Estrogen receptor status: Positive Progesterone receptor status: Positive Stage used in treatment planning: Yes National guidelines used in treatment planning: Yes Type of national guideline used in treatment planning: NCCN - Pathologic stage from 07/03/2014: Stage Unknown (Tis (DCIS), NX, cM0) - Signed by [name], MD on 07/10/2014 Staged by: Pathologist Laterality: Left Estrogen receptor status: Positive Progesterone receptor status: Positive Stage used in treatment planning: Yes National guidelines used in treatment planning: Yes Type of national guideline used in treatment planning: NCCN Staging comments: Staged on final lumpectomy specimen by Dr. [name].  Right colon cancer Staging form: Colon and Rectum, AJCC 8th Edition - Pathologic stage from 11/01/2019: Stage IIIB (pT3, pN2a, cM0) - Signed by [name], MD on 11/29/2019 Stage prefix: Initial  diagnosis Histologic  grading system: 4 grade system Histologic grade (G): G2 Residual tumor (R): R0 - None Tumor deposits (TD): Absent Perineural invasion (PNI): Absent Microsatellite instability (MSI): Stable KRAS mutation: Unknown NRAS mutation: Unknown BRAF mutation: Unknown     Malignant neoplasm of female breast (HCC)  05/29/2014 Initial Biopsy   Left breast needle core biopsy: Grade 2, DCIS with calcs. ER+ (100%), PR+ (96%).    06/04/2014 Initial Diagnosis   Left breast DCIS with calcifications, ER 100%, PR 96%   06/10/2014 Breast MRI   Left breast: 2.4 x 1.3 x 1.1 cm area of patchy non-mass enhancement upper outer quadrant includes postbiopsy seroma; Right breast: 1.2 cm previously biopsied stable benign fibroadenoma   06/12/2014 Procedure   Genetic counseling/testing: Identified 1 VUS on CHEK2 gene. Remainder of 17 gene panel tested negative and included: ATM, BARD1, BRCA 1/2, BRIP1, CDH1, CHEK2, EPCAM, MLH1, MSH2, MSH6, NBN, NF1, PALB2, PTEN, RAD50, RAD51C, RAD51D, STK11, and TP53.    07/01/2014 Surgery   Left breast lumpectomy ([facility]): Grade 1, DCIS, spanning 2.3 cm, 1 mm margin, ER 100%, PR 96%   07/31/2014 - 08/28/2014 Radiation Therapy   Adjuvant RT completed [name]). Left breast: Total dose 42.5 Gy over 17 fractions. Left breast boost: Total dose 7.5 Gy over 3 fractions.    09/14/2014 - 01/13/2020 Anti-estrogen oral therapy   Anastrazole 1mg  daily. Planned duration of treatment: 5 years [place]). Completed in 01/2020.    09/25/2014 Survivorship   Survivorship Care Plan given to patient and reviewed with her in person.    03/02/2021 Imaging   CT CAP  IMPRESSION: 1. No findings of active/recurrent malignancy. Partial right hemicolectomy. 2. Endometrial stripe remains mildly thickened, but endometrial biopsy in May was negative for malignancy. 3. Progressive endplate sclerosis and endplate irregularity at T2-3, probably due to degenerative endplate findings. If the has referable upper  thoracic pain/symptoms then thoracic spine MRI could be used for further workup. 4. Other imaging findings of potential clinical significance: Mild cardiomegaly. Aortic Atherosclerosis (ICD10-I70.0). Mild mitral valve calcification. Postoperative findings in the left breast with adjacent radiation port anteriorly in the left upper lobe. Tiny pulmonary nodules in the left lower lobe are unchanged from earliest available comparison of 10/17/2019 and probably benign although may merit surveillance. Multilevel lumbar impingement. Mild pelvic floor laxity.   Malignant neoplasm of ascending colon (HCC)  09/19/2019 Imaging   CT AP W contrast 09/19/19  IMPRESSION Fullness in the cecum, cannot exclude a mass. No evidence for metastatic disease is identified.    09/24/2019 Procedure   Colonoscopy by Dr [name] 09/24/19 IMPRESSION 1. The colon was redundant  2. Mild diverticulosis was noted through the entire examined colon 3. Single 12mm polyp was found in the ascending colon; polypectomy was performed using snare cautery and biopsy forceps 4. Mild diverticulosis was notes in the descending colon and sigmoid colon.  5. Single polyp was found in the sigmoid colon, polypectomy was performed with cold forceps.  6. Single polyp was found in the rectosigmoid colon; polypectomy was performed with cold snare  7. Small internal hemorrhoids  8. Large mass was found at the cecum; multiple biopsies of the area were performed using cold forceps; injection (tattooing) was performed distal to the mass.    09/24/2019 Initial Biopsy   INTERPRETATION AND DIAGNOSIS:  A. Cecum, biopsy:  Invasive moderately differentiated adenocarcinoma.  see comment  B. Polyp @ ascending colon, polypectomy:  Tubular Adenoma  C. Polyp @ sigmoid colon Polypectomy:  hyperplastic polyp.  D. Polyp @  rectosigmoid colon, Polypectomy:  Hyperplastic Polyp      10/16/2019 Imaging   CT Chest IMPRESSION: 1. Multiple small pulmonary nodules  measuring 5 mm or less in size in the lungs. These are nonspecific and are typically considered statistically likely benign. However, given the patient's history of primary malignancy, close attention on follow-up studies is recommended to ensure stability. 2. Aortic atherosclerosis, in addition to right coronary artery disease. Assessment for potential risk factor modification, dietary therapy or pharmacologic therapy may be warranted, if clinically indicated. 3. There are calcifications of the aortic valve and mitral annulus. Echocardiographic correlation for evaluation of potential valvular dysfunction may be warranted if clinically indicated. 4. Small hiatal hernia.   Aortic Atherosclerosis (ICD10-I70.0).   11/01/2019 Initial Diagnosis   Colon cancer (HCC)   11/01/2019 Surgery   LAPAROSCOPIC PARTIAL COLECTOMY by Dr Maisie Fus and Dr Michaell Cowing   11/01/2019 Pathology Results   FINAL MICROSCOPIC DIAGNOSIS:   A. COLON, PROXIMAL RIGHT, COLECTOMY:  - Invasive colonic adenocarcinoma, 5 cm.  - Tumor invades through the muscularis propria into pericolonic tissues.   - Margins of resection are not involved.  - Metastatic carcinoma in (5) of (13) lymph nodes.  - See oncology table.    MSI Stable  Mismatch repair normal  MLH1 - Preserved nuclear expression (greater 50% tumor expression) MSH2 - Preserved nuclear expression (greater 50% tumor expression) MSH6 - Preserved nuclear expression (greater 50% tumor expression) PMS2 - Preserved nuclear expression (greater 50% tumor expression)   11/01/2019 Cancer Staging   Staging form: Colon and Rectum, AJCC 8th Edition - Pathologic stage from 11/01/2019: Stage IIIB (pT3, pN2a, cM0) - Signed by [name], MD on 11/29/2019   12/10/2019 Procedure   PAC placed 12/10/19   12/17/2019 - 06/01/2020 Chemotherapy   FOLFOX q2weeks starting in 2 weeks starting 12/17/19. Held 01/27/20-02/10/20 due to b/l PE. Oxaliplatin held C11-12 due to neuropathy. Completed on  06/01/20   03/02/2021 Imaging   CT CAP  IMPRESSION: 1. No findings of active/recurrent malignancy. Partial right hemicolectomy. 2. Endometrial stripe remains mildly thickened, but endometrial biopsy in May was negative for malignancy. 3. Progressive endplate sclerosis and endplate irregularity at T2-3, probably due to degenerative endplate findings. If the has referable upper thoracic pain/symptoms then thoracic spine MRI could be used for further workup. 4. Other imaging findings of potential clinical significance: Mild cardiomegaly. Aortic Atherosclerosis (ICD10-I70.0). Mild mitral valve calcification. Postoperative findings in the left breast with adjacent radiation port anteriorly in the left upper lobe. Tiny pulmonary nodules in the left lower lobe are unchanged from earliest available comparison of 10/17/2019 and probably benign although may merit surveillance. Multilevel lumbar impingement. Mild pelvic floor laxity.   08/26/2021 Imaging   EXAM: CT CHEST, ABDOMEN, AND PELVIS WITH CONTRAST  IMPRESSION: 1. Stable examination without new or progressive findings to suggest local recurrence or metastatic disease within the chest, abdomen, or pelvis. 2. Hepatomegaly with hepatic steatosis. 3. Sigmoid colonic diverticulosis without findings of acute diverticulitis. 4. Similar prominent endplate sclerosis and irregularity at T2-T3 is most consistent with Modic type endplate degenerative changes. However, if patient has referable upper thoracic pain consider further workup with thoracic spine MRI. 5. Similar thickening of the endometrial stripe measuring up to 8 mm, which was previously biopsied with results negative for malignancy. 6. Aortic Atherosclerosis (ICD10-I70.0).   11/10/2021 PET scan   IMPRESSION: 1. LEFT ovary is increased in size and is intensely hypermetabolic. While physiologic hypermetabolic ovarian tissue is not uncommon, the enlargement and asymmetric activity  warrants further  evaluation. Consider contrast pelvic MRI vs tissue sampling. 2. No evidence of metastatic colorectal carcinoma otherwise. 3. Post RIGHT hemicolectomy anatomy. 4. Evidence of radiation change in the LEFT upper lobe (remote breast cancer).     12/22/2021 Relapse/Recurrence    FINAL MICROSCOPIC DIAGNOSIS:   A. LEFT OVARY AND FALLOPIAN TUBE, SALPINGO OOPHORECTOMY:  - Metastatic moderately differentiated colonic adenocarcinoma involving left ovary  - Focal ovarian stromal calcification  - Segment of benign fallopian tube   B. UTERUS WITH RIGHT FALLOPIAN TUBE AND OVARY, HYSTERECTOMY AND SALPINGO-OOPHORECTOMY:  - Metastatic moderately differentiated colonic adenocarcinoma involving right ovary  - Focal invasive extensive adenomyosis  - Benign endometrial polyps  - Benign proliferative phase endometrium  - Hydrosalpinx of right fallopian tube  - Focal ovarian stromal calcification   C. PERITONEAL DEPOSITS, ANTERIOR CUL DE SAC, BIOPSY:  - Metastatic mucinous adenocarcinoma, consistent with colorectal primary    COMMENT:  Immunohistochemical stains show that the tumor cells are positive for CK20 and CDX2 while they are negative for CK7 and PAX8, consistent with above interpretation.    01/24/2022 - 03/23/2022 Chemotherapy   Patient is on Treatment Plan : COLORECTAL FOLFOX q14d x 3 months     04/01/2022 Imaging   CT AP IMPRESSION: 1. New omental metastatic disease. 2. Vague hypoattenuating lesion in the inferior right hepatic lobe is new and also worrisome for metastatic disease. 3. Similar small right lower lobe nodules. Recommend continued attention on follow-up. 4. Steatotic enlarged liver. 5.  Aortic atherosclerosis (ICD10-I70.0).   04/06/2022 -  Chemotherapy   Patient is on Treatment Plan : COLORECTAL FOLFIRI + Bevacizumab q14d     04/21/2022 Imaging    IMPRESSION: 1. Stable chest CT. No evidence of metastatic disease. 2. Stable small pulmonary nodules,  considered benign based on stability. 3. Stable postsurgical changes in the left breast and anterior left upper lobe radiation changes. 4. Aortic Atherosclerosis (ICD10-I70.0) and Emphysema (ICD10-J43.9).   04/21/2022 Imaging    IMPRESSION: 1. Small enhancing lesion inferiorly in the right hepatic lobe is typical of metastatic disease and stable from recent abdominal CT. No other definite liver lesions are identified. Mild hepatic steatosis. 2. No other significant abdominal findings. 3. Omental nodularity seen on CT is not well visualized by MRI.    Imaging     06/23/2022 Imaging    IMPRESSION: 1. Hypodense lesion of the inferior right lobe of the liver, hepatic segment VI is diminished in size, consistent with treatment response. 2. Tiny peritoneal and omental nodules identified by prior examination are diminished in size, consistent with treatment response. 3. Multiple tiny pulmonary nodules unchanged, most likely benign and incidental, however continued attention on follow-up warranted in the setting of known metastatic disease. 4. No evidence of new metastatic disease in the chest, abdomen, or pelvis. 5. Status post partial right hemicolectomy and reanastomosis. 6. Diffuse mosaic attenuation of the airspaces, consistent with small airways disease. 7. Hepatomegaly.      Discussed the use of AI scribe software for clinical note transcription with the patient, who gave verbal consent to proceed.  History of Present Illness   The patient, a 77 year old female with metastatic colon cancer, presents with sudden onset diarrhea and vomiting. She reports waking up early in the morning with the need to use the bathroom and has had multiple bowel movements since. She took two Imodium tablets for symptom relief. This episode of diarrhea is not the first, with similar episodes occurring approximately every couple of months. However, this is the first time vomiting  has accompanied  the diarrhea. Over the weekend, she was symptom-free. She denies fever and abdominal cramps. She has not consumed anything out of the ordinary.  The patient also reports feeling tired, which she attributes to the early morning wake-up and subsequent symptoms. Despite this, she feels well enough to continue with her scheduled chemotherapy treatment. She has been managing her hydration with liquids like Gatorade at home.  The patient is also planning for the upcoming holidays, intending to continue with her chemotherapy treatment schedule despite the festivities. She expresses a desire to complete her treatment by Christmas.         All other systems were reviewed with the patient and are negative.  MEDICAL HISTORY:  Past Medical History:  Diagnosis Date   Aortic atherosclerosis (HCC)    Arthritis    feet, lower back   Basal cell carcinoma    arm   Breast cancer of upper-outer quadrant of left female breast (HCC) 06/04/2014   Cataract    immature on the left   Colon cancer (HCC) 08/2019   Diabetes mellitus without complication (HCC)    Diverticulosis    Dizziness    > 71yrs ago;took Antivert    Family history of anesthesia complication    sister slow to wake up with anesthesia   Family history of breast cancer    Family history of colon cancer    Family history of uterine cancer    GERD (gastroesophageal reflux disease)    takes occasional TUMs   History of bronchitis    > 56yrs ago   History of colon polyps    History of hiatal hernia    Small noted on CT   History of pulmonary embolus (PE)    Hypertension    takes Losartan daily and HCTZ   Iron deficiency anemia    Joint pain    Numbness    to toes on each foot   Peripheral neuropathy    feet and toes   Personal history of radiation therapy    Pulmonary nodules    Noted on CT   Radiation 07/31/14-08/28/14   Left Breast 20 fxs   Seasonal allergies    takes Claritin prn   Urinary frequency    Vitamin D deficiency     takes VIt D daily    SURGICAL HISTORY: Past Surgical History:  Procedure Laterality Date   BREAST BIOPSY Bilateral    BREAST LUMPECTOMY Left    BREAST LUMPECTOMY WITH RADIOACTIVE SEED LOCALIZATION Left 07/01/2014   Procedure: LEFT BREAST LUMPECTOMY WITH RADIOACTIVE SEED LOCALIZATION;  Surgeon: Glenna Fellows, MD;  Location: [facility];  Service: General;  Laterality: Left;   CATARACT EXTRACTION Right    COLONOSCOPY  09/24/2019   [place]   LAPAROSCOPIC PARTIAL COLECTOMY N/A 11/01/2019   Procedure: LAPAROSCOPIC PARTIAL COLECTOMY;  Surgeon: [name], MD;  Location: [facility];  Service: General;  Laterality: N/A;   POLYPECTOMY     PORTACATH PLACEMENT N/A 12/10/2019   Procedure: INSERTION PORT-A-CATH ULTRASOUND GUIDED IN RIGHT IJ;  Surgeon: [name], MD;  Location: [facility];  Service: General;  Laterality: N/A;   ROBOTIC ASSISTED TOTAL HYSTERECTOMY Bilateral 12/22/2021   Procedure: XI ROBOTIC ASSISTED TOTAL HYSTERECTOMY WITH BILATERAL SALPINGO OOPHORECTOMY;  Surgeon: Carver Fila, MD;  Location: [facility];  Service: Gynecology;  Laterality: Bilateral;   TOTAL KNEE ARTHROPLASTY Left 10/24/2012   Procedure: TOTAL KNEE ARTHROPLASTY;  Surgeon: Nestor Lewandowsky, MD;  Location: [facility];  Service: Orthopedics;  Laterality: Left;  TOTAL KNEE ARTHROPLASTY Right 01/09/2013   Procedure: TOTAL KNEE ARTHROPLASTY;  Surgeon: Nestor Lewandowsky, MD;  Location: [facility];  Service: Orthopedics;  Laterality: Right;   TUBAL LIGATION      I have reviewed the social history and family history with the patient and they are unchanged from previous note.  ALLERGIES:  is allergic to oxycodone.  MEDICATIONS:  Current Outpatient Medications  Medication Sig Dispense Refill   b complex vitamins capsule Take 1 capsule by mouth daily.     Cholecalciferol (VITAMIN D) 2000 UNITS CAPS Take 2,000 Units by mouth daily.      ELIQUIS 5 MG TABS tablet Take 1 tablet by mouth twice daily 60 tablet 0    ibuprofen (ADVIL) 200 MG tablet Take 200 mg by mouth daily as needed (pain).     lidocaine-prilocaine (EMLA) cream Apply 1 Application topically as needed. 30 g 0   loratadine (CLARITIN) 10 MG tablet Take 10 mg by mouth daily as needed for allergies.     losartan (COZAAR) 25 MG tablet Take 1 tablet (25 mg total) by mouth daily. 90 tablet 3   metFORMIN (GLUCOPHAGE) 500 MG tablet Take 1 tablet (500 mg total) by mouth in the morning and at bedtime. 180 tablet 3   Multiple Vitamins-Minerals (MULTIVITAMIN WITH MINERALS) tablet Take 1 tablet by mouth daily.     omeprazole (PRILOSEC) 20 MG capsule Take 20 mg by mouth daily before breakfast.      ondansetron (ZOFRAN) 8 MG tablet Take 1 tablet (8 mg total) by mouth every 8 (eight) hours as needed for nausea or vomiting. Start on the third day after chemotherapy. 30 tablet 1   potassium chloride (KLOR-CON) 10 MEQ tablet Take 1 tablet (10 mEq total) by mouth daily. 30 tablet 0   pravastatin (PRAVACHOL) 10 MG tablet Take 1 tablet (10 mg total) by mouth daily. 90 tablet 3   pregabalin (LYRICA) 25 MG capsule Take 1 capsule (25 mg total) by mouth every morning. Take in addition to 50 mg at bedtime 60 capsule 2   pregabalin (LYRICA) 50 MG capsule Take 1 capsule (50 mg total) by mouth at bedtime. Take in addition to 25 mg in the morning 60 capsule 2   prochlorperazine (COMPAZINE) 10 MG tablet Take 1 tablet (10 mg total) by mouth every 6 (six) hours as needed for nausea or vomiting. 30 tablet 1   triamcinolone ointment (KENALOG) 0.5 % Apply 1 Application topically 2 (two) times daily. 30 g 0   No current facility-administered medications for this visit.   Facility-Administered Medications Ordered in Other Visits  Medication Dose Route Frequency Provider Last Rate Last Admin   atropine injection 0.5 mg  0.5 mg Intravenous Once PRN [name], MD       fluorouracil (ADRUCIL) 5,000 mg in sodium chloride 0.9 % 150 mL chemo infusion  2,400 mg/m2 (Treatment Plan  Recorded) Intravenous 1 day or 1 dose [name], MD   Infusion Verify at 02/27/23 1328   heparin lock flush 100 unit/mL  500 Units Intracatheter Once PRN [name], MD       sodium chloride flush (NS) 0.9 % injection 10 mL  10 mL Intracatheter PRN [name], MD        PHYSICAL EXAMINATION: ECOG PERFORMANCE STATUS: 1 - Symptomatic but completely ambulatory  Vitals:   02/27/23 0934  BP: (!) 132/58  Pulse: 69  Resp: 18  Temp: 98.5 F (36.9 C)  SpO2: 99%   Wt Readings from Last 3  Encounters:  02/27/23 213 lb 1.6 oz (96.7 kg)  02/23/23 208 lb 6.4 oz (94.5 kg)  02/13/23 214 lb 6.4 oz (97.3 kg)     GENERAL:alert, no distress and comfortable SKIN: skin color, texture, turgor are normal, no rashes or significant lesions EYES: normal, Conjunctiva are pink and non-injected, sclera clear NECK: supple, thyroid normal size, non-tender, without nodularity LYMPH:  no palpable lymphadenopathy in the cervical, axillary  LUNGS: clear to auscultation and percussion with normal breathing effort HEART: regular rate & rhythm and no murmurs and no lower extremity edema ABDOMEN:abdomen soft, non-tender and normal bowel sounds Musculoskeletal:no cyanosis of digits and no clubbing  NEURO: alert & oriented x 3 with fluent speech, no focal motor/sensory deficits       LABORATORY DATA:  I have reviewed the data as listed    Latest Ref Rng & Units 02/27/2023    8:29 AM 02/13/2023    9:36 AM 01/30/2023    8:41 AM  CBC  WBC 4.0 - 10.5 K/uL 17.0  11.0  17.6   Hemoglobin 12.0 - 15.0 g/dL 16.1  09.6  04.5   Hematocrit 36.0 - 46.0 % 35.7  33.2  33.9   Platelets 150 - 400 K/uL 257  207  240         Latest Ref Rng & Units 02/27/2023    8:29 AM 02/13/2023    9:36 AM 01/30/2023    8:41 AM  CMP  Glucose 70 - 99 mg/dL 409  811  914   BUN 8 - 23 mg/dL 23  14  14    Creatinine 0.44 - 1.00 mg/dL 7.82  9.56  2.13   Sodium 135 - 145 mmol/L 142  140  142   Potassium 3.5 - 5.1 mmol/L 4.0  4.4  4.2    Chloride 98 - 111 mmol/L 112  109  109   CO2 22 - 32 mmol/L 23  26  25    Calcium 8.9 - 10.3 mg/dL 9.1  9.0  9.3   Total Protein 6.5 - 8.1 g/dL 6.4  6.3  6.3   Total Bilirubin 0.3 - 1.2 mg/dL 0.7  0.8  0.7   Alkaline Phos 38 - 126 U/L 131  131  146   AST 15 - 41 U/L 21  23  27    ALT 0 - 44 U/L 15  18  16        RADIOGRAPHIC STUDIES: I have personally reviewed the radiological images as listed and agreed with the findings in the report. No results found.    Orders Placed This Encounter  Procedures   CBC with Differential (Cancer Center Only)    Standing Status:   Future    Standing Expiration Date:   04/09/2024   CMP (Cancer Center only)    Standing Status:   Future    Standing Expiration Date:   04/09/2024   CBC with Differential (Cancer Center Only)    Standing Status:   Future    Standing Expiration Date:   04/30/2024   CMP (Cancer Center only)    Standing Status:   Future    Standing Expiration Date:   04/30/2024   All questions were answered. The patient knows to call the clinic with any problems, questions or concerns. No barriers to learning was detected. The total time spent in the appointment was 25 minutes.     [name], MD 02/27/2023

## 2023-02-27 NOTE — Patient Instructions (Signed)
[facility]  Discharge Instructions: Thank you for choosing [facility] to provide your oncology and hematology care.   If you have a lab appointment with the Cancer Center, please go directly to the Cancer Center and check in at the registration area.   Wear comfortable clothing and clothing appropriate for easy access to any Portacath or PICC line.   We strive to give you quality time with your provider. You may need to reschedule your appointment if you arrive late (15 or more minutes).  Arriving late affects you and other patients whose appointments are after yours.  Also, if you miss three or more appointments without notifying the office, you may be dismissed from the clinic at the provider's discretion.      For prescription refill requests, have your pharmacy contact our office and allow 72 hours for refills to be completed.    Today you received the following chemotherapy and/or immunotherapy agents: Bevacizumab (Mvasi), Irinotecan, Leucovorin, and Fluorouracil.      To help prevent nausea and vomiting after your treatment, we encourage you to take your nausea medication as directed.  BELOW ARE SYMPTOMS THAT SHOULD BE REPORTED IMMEDIATELY: *FEVER GREATER THAN 100.4 F (38 C) OR HIGHER *CHILLS OR SWEATING *NAUSEA AND VOMITING THAT IS NOT CONTROLLED WITH YOUR NAUSEA MEDICATION *UNUSUAL SHORTNESS OF BREATH *UNUSUAL BRUISING OR BLEEDING *URINARY PROBLEMS (pain or burning when urinating, or frequent urination) *BOWEL PROBLEMS (unusual diarrhea, constipation, pain near the anus) TENDERNESS IN MOUTH AND THROAT WITH OR WITHOUT PRESENCE OF ULCERS (sore throat, sores in mouth, or a toothache) UNUSUAL RASH, SWELLING OR PAIN  UNUSUAL VAGINAL DISCHARGE OR ITCHING   Items with * indicate a potential emergency and should be followed up as soon as possible or go to the Emergency Department if any problems should occur.  Please show the  CHEMOTHERAPY ALERT CARD or IMMUNOTHERAPY ALERT CARD at check-in to the Emergency Department and triage nurse.  Should you have questions after your visit or need to cancel or reschedule your appointment, please contact [place] [facility]  Dept: [phone]  and follow the prompts.  Office hours are 8:00 a.m. to 4:30 p.m. Monday - Friday. Please note that voicemails left after 4:00 p.m. may not be returned until the following business day.  We are closed weekends and major holidays. You have access to a nurse at all times for urgent questions. Please call the main number to the clinic Dept: [phone] and follow the prompts.   For any non-urgent questions, you may also contact your provider using MyChart. We now offer e-Visits for anyone 16 and older to request care online for non-urgent symptoms. For details visit [email or web address].   Also download the MyChart app! Go to the app store, search "MyChart", open the app, select [place], and log in with your MyChart username and password. The chemotherapy medication bag should finish at 46 hours, 96 hours, or 7 days. For example, if your pump is scheduled for 46 hours and it was put on at 4:00 p.m., it should finish at 2:00 p.m. the day it is scheduled to come off regardless of your appointment time.     Estimated time to finish at:    If the display on your pump reads "Low Volume" and it is beeping, take the batteries out of the pump and come to the cancer center for it to be taken off.   If the pump alarms go  off prior to the pump reading "Low Volume" then call [phone] and someone can assist you.  If the plunger comes out and the chemotherapy medication is leaking out, please use your home chemo spill kit to clean up the spill. Do NOT use paper towels or other household products.  If you have problems or questions regarding your pump, please call either [phone] (24 hours a day) or the cancer  center Monday-Friday 8:00 a.m.- 4:30 p.m. at the clinic number and we will assist you. If you are unable to get assistance, then go to the nearest Emergency Department and ask the staff to contact the IV team for assistance.

## 2023-03-01 ENCOUNTER — Inpatient Hospital Stay: Payer: Medicare Other

## 2023-03-01 DIAGNOSIS — E876 Hypokalemia: Secondary | ICD-10-CM | POA: Diagnosis not present

## 2023-03-01 DIAGNOSIS — Z90722 Acquired absence of ovaries, bilateral: Secondary | ICD-10-CM | POA: Diagnosis not present

## 2023-03-01 DIAGNOSIS — C7963 Secondary malignant neoplasm of bilateral ovaries: Secondary | ICD-10-CM | POA: Diagnosis not present

## 2023-03-01 DIAGNOSIS — C182 Malignant neoplasm of ascending colon: Secondary | ICD-10-CM

## 2023-03-01 DIAGNOSIS — E1142 Type 2 diabetes mellitus with diabetic polyneuropathy: Secondary | ICD-10-CM | POA: Diagnosis not present

## 2023-03-01 DIAGNOSIS — Z5189 Encounter for other specified aftercare: Secondary | ICD-10-CM | POA: Diagnosis not present

## 2023-03-01 DIAGNOSIS — Z90710 Acquired absence of both cervix and uterus: Secondary | ICD-10-CM | POA: Diagnosis not present

## 2023-03-01 DIAGNOSIS — C786 Secondary malignant neoplasm of retroperitoneum and peritoneum: Secondary | ICD-10-CM | POA: Diagnosis not present

## 2023-03-01 DIAGNOSIS — Z5111 Encounter for antineoplastic chemotherapy: Secondary | ICD-10-CM | POA: Diagnosis not present

## 2023-03-01 DIAGNOSIS — Z853 Personal history of malignant neoplasm of breast: Secondary | ICD-10-CM | POA: Diagnosis not present

## 2023-03-01 DIAGNOSIS — C787 Secondary malignant neoplasm of liver and intrahepatic bile duct: Secondary | ICD-10-CM | POA: Diagnosis not present

## 2023-03-01 MED ORDER — HEPARIN SOD (PORK) LOCK FLUSH 100 UNIT/ML IV SOLN
500.0000 [IU] | Freq: Once | INTRAVENOUS | Status: AC | PRN
  Administered 2023-03-01: 500 [IU]

## 2023-03-01 MED ORDER — PEGFILGRASTIM-CBQV 6 MG/0.6ML ~~LOC~~ SOSY
6.0000 mg | PREFILLED_SYRINGE | Freq: Once | SUBCUTANEOUS | Status: AC
Start: 2023-03-01 — End: 2023-03-01
  Administered 2023-03-01: 6 mg via SUBCUTANEOUS
  Filled 2023-03-01 (×2): qty 0.6

## 2023-03-01 MED ORDER — SODIUM CHLORIDE 0.9% FLUSH
10.0000 mL | INTRAVENOUS | Status: DC | PRN
  Administered 2023-03-01: 10 mL

## 2023-03-01 NOTE — Patient Instructions (Signed)

## 2023-03-12 NOTE — Assessment & Plan Note (Signed)
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 Path showed overall Stage IIIB cancer.  -s/p 6 months adjuvant FOLFOX, completed 06/01/20, oxaliplatin held for final 2 cycles due to neuropathy.  -s/p BSO and hysterectomy on 12/22/21 with Dr. Pricilla Holm. Path revealed metastatic moderately differentiated colonic adenocarcinoma involving both ovaries and peritoneum -she restarted FOLFOX on 01/24/22. -Due to disease progression, chemotherapy changed to second line FOLFIRI and bevacizumab on April 06, 2022. She has been tolerating moderately well, better now with dose reduction, will continue -Restaging CT scan from 09/27/2022 showed continous response in liver and peritoneal metastasis, no other new lesions.   -We discussed maintenance therapy option in future  -due to her PE in 10/2022, will hold beva for 3 months  -restaging CT 12/31/2022 showed stable disease and live met is not well defined on CT. She restarted beva on 01/17/23

## 2023-03-13 ENCOUNTER — Inpatient Hospital Stay: Payer: Medicare Other | Attending: Hematology

## 2023-03-13 ENCOUNTER — Inpatient Hospital Stay (HOSPITAL_BASED_OUTPATIENT_CLINIC_OR_DEPARTMENT_OTHER): Payer: Medicare Other

## 2023-03-13 ENCOUNTER — Inpatient Hospital Stay: Payer: Medicare Other

## 2023-03-13 ENCOUNTER — Other Ambulatory Visit: Payer: Self-pay

## 2023-03-13 ENCOUNTER — Inpatient Hospital Stay: Payer: Medicare Other | Admitting: Hematology

## 2023-03-13 VITALS — BP 153/61 | HR 63 | Temp 97.8°F | Resp 16 | Wt 215.4 lb

## 2023-03-13 DIAGNOSIS — C182 Malignant neoplasm of ascending colon: Secondary | ICD-10-CM | POA: Insufficient documentation

## 2023-03-13 DIAGNOSIS — C7963 Secondary malignant neoplasm of bilateral ovaries: Secondary | ICD-10-CM | POA: Diagnosis not present

## 2023-03-13 DIAGNOSIS — Z95828 Presence of other vascular implants and grafts: Secondary | ICD-10-CM

## 2023-03-13 DIAGNOSIS — I10 Essential (primary) hypertension: Secondary | ICD-10-CM | POA: Insufficient documentation

## 2023-03-13 DIAGNOSIS — C787 Secondary malignant neoplasm of liver and intrahepatic bile duct: Secondary | ICD-10-CM | POA: Diagnosis not present

## 2023-03-13 DIAGNOSIS — Z5189 Encounter for other specified aftercare: Secondary | ICD-10-CM | POA: Insufficient documentation

## 2023-03-13 DIAGNOSIS — C786 Secondary malignant neoplasm of retroperitoneum and peritoneum: Secondary | ICD-10-CM | POA: Diagnosis not present

## 2023-03-13 DIAGNOSIS — Z5111 Encounter for antineoplastic chemotherapy: Secondary | ICD-10-CM | POA: Diagnosis present

## 2023-03-13 LAB — CBC WITH DIFFERENTIAL (CANCER CENTER ONLY)
Abs Immature Granulocytes: 0.84 10*3/uL — ABNORMAL HIGH (ref 0.00–0.07)
Basophils Absolute: 0.1 10*3/uL (ref 0.0–0.1)
Basophils Relative: 1 %
Eosinophils Absolute: 0.3 10*3/uL (ref 0.0–0.5)
Eosinophils Relative: 2 %
HCT: 34.3 % — ABNORMAL LOW (ref 36.0–46.0)
Hemoglobin: 10.7 g/dL — ABNORMAL LOW (ref 12.0–15.0)
Immature Granulocytes: 6 %
Lymphocytes Relative: 27 %
Lymphs Abs: 3.5 10*3/uL (ref 0.7–4.0)
MCH: 31.9 pg (ref 26.0–34.0)
MCHC: 31.2 g/dL (ref 30.0–36.0)
MCV: 102.4 fL — ABNORMAL HIGH (ref 80.0–100.0)
Monocytes Absolute: 1 10*3/uL (ref 0.1–1.0)
Monocytes Relative: 7 %
Neutro Abs: 7.5 10*3/uL (ref 1.7–7.7)
Neutrophils Relative %: 57 %
Platelet Count: 218 10*3/uL (ref 150–400)
RBC: 3.35 MIL/uL — ABNORMAL LOW (ref 3.87–5.11)
RDW: 17 % — ABNORMAL HIGH (ref 11.5–15.5)
WBC Count: 13.3 10*3/uL — ABNORMAL HIGH (ref 4.0–10.5)
nRBC: 0 % (ref 0.0–0.2)

## 2023-03-13 LAB — CMP (CANCER CENTER ONLY)
ALT: 19 U/L (ref 0–44)
AST: 27 U/L (ref 15–41)
Albumin: 3.8 g/dL (ref 3.5–5.0)
Alkaline Phosphatase: 138 U/L — ABNORMAL HIGH (ref 38–126)
Anion gap: 6 (ref 5–15)
BUN: 17 mg/dL (ref 8–23)
CO2: 26 mmol/L (ref 22–32)
Calcium: 9.3 mg/dL (ref 8.9–10.3)
Chloride: 111 mmol/L (ref 98–111)
Creatinine: 0.82 mg/dL (ref 0.44–1.00)
GFR, Estimated: >60 mL/min (ref >60)
Glucose, Bld: 101 mg/dL — ABNORMAL HIGH (ref 70–99)
Potassium: 3.9 mmol/L (ref 3.5–5.1)
Sodium: 143 mmol/L (ref 135–145)
Total Bilirubin: 0.7 mg/dL (ref <1.2)
Total Protein: 6.2 g/dL — ABNORMAL LOW (ref 6.5–8.1)

## 2023-03-13 LAB — TOTAL PROTEIN, URINE DIPSTICK: Protein, ur: NEGATIVE mg/dL

## 2023-03-13 LAB — CEA (ACCESS): CEA (CHCC): 8.47 ng/mL — ABNORMAL HIGH (ref 0.00–5.00)

## 2023-03-13 MED ORDER — SODIUM CHLORIDE 0.9% FLUSH
10.0000 mL | INTRAVENOUS | Status: DC | PRN
Start: 2023-03-13 — End: 2023-03-13
  Administered 2023-03-13: 10 mL

## 2023-03-13 MED ORDER — SODIUM CHLORIDE 0.9 % IV SOLN
400.0000 mg/m2 | Freq: Once | INTRAVENOUS | Status: AC
  Administered 2023-03-13: 896 mg via INTRAVENOUS
  Filled 2023-03-13: qty 44.8

## 2023-03-13 MED ORDER — FLUOROURACIL CHEMO INJECTION 5 GM/100ML
2400.0000 mg/m2 | INTRAVENOUS | Status: DC
  Administered 2023-03-13: 5000 mg via INTRAVENOUS
  Filled 2023-03-13: qty 100

## 2023-03-13 MED ORDER — ATROPINE SULFATE 1 MG/ML IV SOLN
0.5000 mg | Freq: Once | INTRAVENOUS | Status: AC | PRN
  Administered 2023-03-13: 0.5 mg via INTRAVENOUS
  Filled 2023-03-13: qty 1

## 2023-03-13 MED ORDER — DEXAMETHASONE SODIUM PHOSPHATE 10 MG/ML IJ SOLN
10.0000 mg | Freq: Once | INTRAMUSCULAR | Status: AC
  Administered 2023-03-13: 10 mg via INTRAVENOUS
  Filled 2023-03-13: qty 1

## 2023-03-13 MED ORDER — APIXABAN 5 MG PO TABS: 5.0000 mg | ORAL_TABLET | Freq: Two times a day (BID) | ORAL | 5 refills | Status: DC

## 2023-03-13 MED ORDER — SODIUM CHLORIDE 0.9 % IV SOLN
100.0000 mg/m2 | Freq: Once | INTRAVENOUS | Status: AC
  Administered 2023-03-13: 220 mg via INTRAVENOUS
  Filled 2023-03-13: qty 11

## 2023-03-13 MED ORDER — SODIUM CHLORIDE 0.9 % IV SOLN: INTRAVENOUS | Status: DC

## 2023-03-13 MED ORDER — PALONOSETRON HCL INJECTION 0.25 MG/5ML
0.2500 mg | Freq: Once | INTRAVENOUS | Status: AC
  Administered 2023-03-13: 0.25 mg via INTRAVENOUS
  Filled 2023-03-13: qty 5

## 2023-03-13 MED ORDER — SODIUM CHLORIDE 0.9 % IV SOLN
5.0000 mg/kg | Freq: Once | INTRAVENOUS | Status: AC
  Administered 2023-03-13: 500 mg via INTRAVENOUS
  Filled 2023-03-13: qty 4

## 2023-03-13 MED ORDER — POTASSIUM CHLORIDE ER 10 MEQ PO TBCR: 10.0000 meq | EXTENDED_RELEASE_TABLET | Freq: Two times a day (BID) | ORAL | 1 refills | Status: DC

## 2023-03-13 NOTE — Progress Notes (Signed)
[facility]   [phone] Fax:([phone]   Clinic Follow up Note   Patient Care Team: [name], MD as PCP - General (Family Medicine) [name], MD as Consulting Physician (General Surgery) [name], MD as Consulting Physician (Hematology) [name], MD as Consulting Physician (General Surgery) [name], MD as Consulting Physician (Hematology and Oncology)  Date of Service:  03/13/2023  CHIEF COMPLAINT: f/u of metastatic colon cancer  CURRENT THERAPY:  FOLFIRI and bevacizumab  Oncology History   Malignant neoplasm of ascending colon (HCC) pT3N2aM0 stage IIB, MSS, metastatic to b/l ovaries and peritoneum and liver, KRAS mutation G12V (+)  -Initially diagnosed in 08/2019, s/p resection with Dr Maisie Fus on 11/01/19. Path showed overall Stage IIIB cancer.  -s/p 6 months adjuvant FOLFOX, completed 06/01/20, oxaliplatin held for final 2 cycles due to neuropathy.  -s/p BSO and hysterectomy on 12/22/21 with Dr. Pricilla Holm. Path revealed metastatic moderately differentiated colonic adenocarcinoma involving both ovaries and peritoneum -she restarted FOLFOX on 01/24/22. -Due to disease progression, chemotherapy changed to second line FOLFIRI and bevacizumab on April 06, 2022. She has been tolerating moderately well, better now with dose reduction, will continue -Restaging CT scan from 09/27/2022 showed continous response in liver and peritoneal metastasis, no other new lesions.   -We discussed maintenance therapy option in future  -due to her PE in 10/2022, will hold beva for 3 months  -restaging CT 12/31/2022 showed stable disease and live met is not well defined on CT. She restarted beva on 01/17/23    Assessment and Plan    Metastatic Rectal Colon Cancer 77 year old with metastatic rectal colon cancer. Reports mild headaches managed with Tylenol, tingling in fingers likely due to cold weather, lack of appetite, and feeling unwell post-infusion  without significant nausea. Diarrhea 3-4 days post-infusion managed with Imodium. Weight stable with slight fluctuations. Tumor markers (CEA) stable, indicating stable disease. Prefers to continue current treatment regimen without breaks. Discussed importance of continuing chemotherapy for disease control and potential side effects such as neuropathy and gastrointestinal symptoms. Patient consents to treatment plan. - Order CT scan to be done in the next few weeks - Continue current chemotherapy regimen - Schedule chemotherapy treatment on November 25 and subsequent treatments in December - Refill Eliquis 2.5 mg twice daily - Refill potassium 10 mEq once daily, with an option to increase to twice daily if potassium levels are low - Schedule appointment with dietician at 12:00 PM in the infusion room - Follow-up in two weeks  Hypertension Blood pressure readings 153/74 and 174/74. No symptoms of headache or chest pain. Manages mild headaches with Tylenol. Discussed importance of monitoring blood pressure and potential need for medication adjustment if readings remain elevated. - Monitor blood pressure - Continue current management  General Health Maintenance Maintaining weight and able to perform daily activities, including caring for grandchildren and household chores. Prefers early morning appointments. - Schedule early morning appointments for convenience  Follow-up -Lab reviewed, adequate for treatment, will proceed FOLFIRI and bevacizumab today and continue every 2 weeks  -schedule CT scan in next 1-2 weeks - Schedule follow-up appointment in two weeks         SUMMARY OF ONCOLOGIC HISTORY: Oncology History Overview Note   Cancer Staging  Malignant neoplasm of female breast [facility]) Staging form: Breast, AJCC 7th Edition - Clinical stage from 06/11/2014: Stage 0 (Tis (DCIS), N0, M0) - Unsigned Staged by: Pathologist and managing physician Laterality: Left Estrogen receptor status:  Positive Progesterone receptor status: Positive Stage  used in treatment planning: Yes National guidelines used in treatment planning: Yes Type of national guideline used in treatment planning: NCCN - Pathologic stage from 07/03/2014: Stage Unknown (Tis (DCIS), NX, cM0) - Signed by [name], MD on 07/10/2014 Staged by: Pathologist Laterality: Left Estrogen receptor status: Positive Progesterone receptor status: Positive Stage used in treatment planning: Yes National guidelines used in treatment planning: Yes Type of national guideline used in treatment planning: NCCN Staging comments: Staged on final lumpectomy specimen by Dr. [name].  Right colon cancer Staging form: Colon and Rectum, AJCC 8th Edition - Pathologic stage from 11/01/2019: Stage IIIB (pT3, pN2a, cM0) - Signed by [name], MD on 11/29/2019 Stage prefix: Initial diagnosis Histologic grading system: 4 grade system Histologic grade (G): G2 Residual tumor (R): R0 - None Tumor deposits (TD): Absent Perineural invasion (PNI): Absent Microsatellite instability (MSI): Stable KRAS mutation: Unknown NRAS mutation: Unknown BRAF mutation: Unknown     Malignant neoplasm of female breast (HCC)  05/29/2014 Initial Biopsy   Left breast needle core biopsy: Grade 2, DCIS with calcs. ER+ (100%), PR+ (96%).    06/04/2014 Initial Diagnosis   Left breast DCIS with calcifications, ER 100%, PR 96%   06/10/2014 Breast MRI   Left breast: 2.4 x 1.3 x 1.1 cm area of patchy non-mass enhancement upper outer quadrant includes postbiopsy seroma; Right breast: 1.2 cm previously biopsied stable benign fibroadenoma   06/12/2014 Procedure   Genetic counseling/testing: Identified 1 VUS on CHEK2 gene. Remainder of 17 gene panel tested negative and included: ATM, BARD1, BRCA 1/2, BRIP1, CDH1, CHEK2, EPCAM, MLH1, MSH2, MSH6, NBN, NF1, PALB2, PTEN, RAD50, RAD51C, RAD51D, STK11, and TP53.    07/01/2014 Surgery   Left breast lumpectomy ([facility]): Grade 1, DCIS,  spanning 2.3 cm, 1 mm margin, ER 100%, PR 96%   07/31/2014 - 08/28/2014 Radiation Therapy   Adjuvant RT completed [name]). Left breast: Total dose 42.5 Gy over 17 fractions. Left breast boost: Total dose 7.5 Gy over 3 fractions.    09/14/2014 - 01/13/2020 Anti-estrogen oral therapy   Anastrazole 1mg  daily. Planned duration of treatment: 5 years [place]). Completed in 01/2020.    09/25/2014 Survivorship   Survivorship Care Plan given to patient and reviewed with her in person.    03/02/2021 Imaging   CT CAP  IMPRESSION: 1. No findings of active/recurrent malignancy. Partial right hemicolectomy. 2. Endometrial stripe remains mildly thickened, but endometrial biopsy in May was negative for malignancy. 3. Progressive endplate sclerosis and endplate irregularity at T2-3, probably due to degenerative endplate findings. If the has referable upper thoracic pain/symptoms then thoracic spine MRI could be used for further workup. 4. Other imaging findings of potential clinical significance: Mild cardiomegaly. Aortic Atherosclerosis (ICD10-I70.0). Mild mitral valve calcification. Postoperative findings in the left breast with adjacent radiation port anteriorly in the left upper lobe. Tiny pulmonary nodules in the left lower lobe are unchanged from earliest available comparison of 10/17/2019 and probably benign although may merit surveillance. Multilevel lumbar impingement. Mild pelvic floor laxity.   Malignant neoplasm of ascending colon (HCC)  09/19/2019 Imaging   CT AP W contrast 09/19/19  IMPRESSION Fullness in the cecum, cannot exclude a mass. No evidence for metastatic disease is identified.    09/24/2019 Procedure   Colonoscopy by Dr [name] 09/24/19 IMPRESSION 1. The colon was redundant  2. Mild diverticulosis was noted through the entire examined colon 3. Single 12mm polyp was found in the ascending colon; polypectomy was performed using snare cautery and biopsy forceps 4. Mild  diverticulosis was  notes in the descending colon and sigmoid colon.  5. Single polyp was found in the sigmoid colon, polypectomy was performed with cold forceps.  6. Single polyp was found in the rectosigmoid colon; polypectomy was performed with cold snare  7. Small internal hemorrhoids  8. Large mass was found at the cecum; multiple biopsies of the area were performed using cold forceps; injection (tattooing) was performed distal to the mass.    09/24/2019 Initial Biopsy   INTERPRETATION AND DIAGNOSIS:  A. Cecum, biopsy:  Invasive moderately differentiated adenocarcinoma.  see comment  B. Polyp @ ascending colon, polypectomy:  Tubular Adenoma  C. Polyp @ sigmoid colon Polypectomy:  hyperplastic polyp.  D. Polyp @ rectosigmoid colon, Polypectomy:  Hyperplastic Polyp      10/16/2019 Imaging   CT Chest IMPRESSION: 1. Multiple small pulmonary nodules measuring 5 mm or less in size in the lungs. These are nonspecific and are typically considered statistically likely benign. However, given the patient's history of primary malignancy, close attention on follow-up studies is recommended to ensure stability. 2. Aortic atherosclerosis, in addition to right coronary artery disease. Assessment for potential risk factor modification, dietary therapy or pharmacologic therapy may be warranted, if clinically indicated. 3. There are calcifications of the aortic valve and mitral annulus. Echocardiographic correlation for evaluation of potential valvular dysfunction may be warranted if clinically indicated. 4. Small hiatal hernia.   Aortic Atherosclerosis (ICD10-I70.0).   11/01/2019 Initial Diagnosis   Colon cancer (HCC)   11/01/2019 Surgery   LAPAROSCOPIC PARTIAL COLECTOMY by Dr Maisie Fus and Dr Michaell Cowing   11/01/2019 Pathology Results   FINAL MICROSCOPIC DIAGNOSIS:   A. COLON, PROXIMAL RIGHT, COLECTOMY:  - Invasive colonic adenocarcinoma, 5 cm.  - Tumor invades through the muscularis propria into  pericolonic tissues.   - Margins of resection are not involved.  - Metastatic carcinoma in (5) of (13) lymph nodes.  - See oncology table.    MSI Stable  Mismatch repair normal  MLH1 - Preserved nuclear expression (greater 50% tumor expression) MSH2 - Preserved nuclear expression (greater 50% tumor expression) MSH6 - Preserved nuclear expression (greater 50% tumor expression) PMS2 - Preserved nuclear expression (greater 50% tumor expression)   11/01/2019 Cancer Staging   Staging form: Colon and Rectum, AJCC 8th Edition - Pathologic stage from 11/01/2019: Stage IIIB (pT3, pN2a, cM0) - Signed by [name], MD on 11/29/2019   12/10/2019 Procedure   PAC placed 12/10/19   12/17/2019 - 06/01/2020 Chemotherapy   FOLFOX q2weeks starting in 2 weeks starting 12/17/19. Held 01/27/20-02/10/20 due to b/l PE. Oxaliplatin held C11-12 due to neuropathy. Completed on 06/01/20   03/02/2021 Imaging   CT CAP  IMPRESSION: 1. No findings of active/recurrent malignancy. Partial right hemicolectomy. 2. Endometrial stripe remains mildly thickened, but endometrial biopsy in May was negative for malignancy. 3. Progressive endplate sclerosis and endplate irregularity at T2-3, probably due to degenerative endplate findings. If the has referable upper thoracic pain/symptoms then thoracic spine MRI could be used for further workup. 4. Other imaging findings of potential clinical significance: Mild cardiomegaly. Aortic Atherosclerosis (ICD10-I70.0). Mild mitral valve calcification. Postoperative findings in the left breast with adjacent radiation port anteriorly in the left upper lobe. Tiny pulmonary nodules in the left lower lobe are unchanged from earliest available comparison of 10/17/2019 and probably benign although may merit surveillance. Multilevel lumbar impingement. Mild pelvic floor laxity.   08/26/2021 Imaging   EXAM: CT CHEST, ABDOMEN, AND PELVIS WITH CONTRAST  IMPRESSION: 1. Stable examination without  new or progressive findings to  suggest local recurrence or metastatic disease within the chest, abdomen, or pelvis. 2. Hepatomegaly with hepatic steatosis. 3. Sigmoid colonic diverticulosis without findings of acute diverticulitis. 4. Similar prominent endplate sclerosis and irregularity at T2-T3 is most consistent with Modic type endplate degenerative changes. However, if patient has referable upper thoracic pain consider further workup with thoracic spine MRI. 5. Similar thickening of the endometrial stripe measuring up to 8 mm, which was previously biopsied with results negative for malignancy. 6. Aortic Atherosclerosis (ICD10-I70.0).   11/10/2021 PET scan   IMPRESSION: 1. LEFT ovary is increased in size and is intensely hypermetabolic. While physiologic hypermetabolic ovarian tissue is not uncommon, the enlargement and asymmetric activity warrants further evaluation. Consider contrast pelvic MRI vs tissue sampling. 2. No evidence of metastatic colorectal carcinoma otherwise. 3. Post RIGHT hemicolectomy anatomy. 4. Evidence of radiation change in the LEFT upper lobe (remote breast cancer).     12/22/2021 Relapse/Recurrence    FINAL MICROSCOPIC DIAGNOSIS:   A. LEFT OVARY AND FALLOPIAN TUBE, SALPINGO OOPHORECTOMY:  - Metastatic moderately differentiated colonic adenocarcinoma involving left ovary  - Focal ovarian stromal calcification  - Segment of benign fallopian tube   B. UTERUS WITH RIGHT FALLOPIAN TUBE AND OVARY, HYSTERECTOMY AND SALPINGO-OOPHORECTOMY:  - Metastatic moderately differentiated colonic adenocarcinoma involving right ovary  - Focal invasive extensive adenomyosis  - Benign endometrial polyps  - Benign proliferative phase endometrium  - Hydrosalpinx of right fallopian tube  - Focal ovarian stromal calcification   C. PERITONEAL DEPOSITS, ANTERIOR CUL DE SAC, BIOPSY:  - Metastatic mucinous adenocarcinoma, consistent with colorectal primary    COMMENT:   Immunohistochemical stains show that the tumor cells are positive for CK20 and CDX2 while they are negative for CK7 and PAX8, consistent with above interpretation.    01/24/2022 - 03/23/2022 Chemotherapy   Patient is on Treatment Plan : COLORECTAL FOLFOX q14d x 3 months     04/01/2022 Imaging   CT AP IMPRESSION: 1. New omental metastatic disease. 2. Vague hypoattenuating lesion in the inferior right hepatic lobe is new and also worrisome for metastatic disease. 3. Similar small right lower lobe nodules. Recommend continued attention on follow-up. 4. Steatotic enlarged liver. 5.  Aortic atherosclerosis (ICD10-I70.0).   04/06/2022 -  Chemotherapy   Patient is on Treatment Plan : COLORECTAL FOLFIRI + Bevacizumab q14d     04/21/2022 Imaging    IMPRESSION: 1. Stable chest CT. No evidence of metastatic disease. 2. Stable small pulmonary nodules, considered benign based on stability. 3. Stable postsurgical changes in the left breast and anterior left upper lobe radiation changes. 4. Aortic Atherosclerosis (ICD10-I70.0) and Emphysema (ICD10-J43.9).   04/21/2022 Imaging    IMPRESSION: 1. Small enhancing lesion inferiorly in the right hepatic lobe is typical of metastatic disease and stable from recent abdominal CT. No other definite liver lesions are identified. Mild hepatic steatosis. 2. No other significant abdominal findings. 3. Omental nodularity seen on CT is not well visualized by MRI.    Imaging     06/23/2022 Imaging    IMPRESSION: 1. Hypodense lesion of the inferior right lobe of the liver, hepatic segment VI is diminished in size, consistent with treatment response. 2. Tiny peritoneal and omental nodules identified by prior examination are diminished in size, consistent with treatment response. 3. Multiple tiny pulmonary nodules unchanged, most likely benign and incidental, however continued attention on follow-up warranted in the setting of known metastatic  disease. 4. No evidence of new metastatic disease in the chest, abdomen, or pelvis. 5. Status post partial right  hemicolectomy and reanastomosis. 6. Diffuse mosaic attenuation of the airspaces, consistent with small airways disease. 7. Hepatomegaly.      Discussed the use of AI scribe software for clinical note transcription with the patient, who gave verbal consent to proceed.  History of Present Illness   Ms. Noren, a 77 year old female with a history of breast cancer and currently undergoing treatment for metastatic rectal colon cancer, presents for a routine follow-up. She reports a persistent, mild headache that occurs after each chemotherapy session and lasts for a couple of days. She manages the headache with over-the-counter Tylenol.  In addition to the headache, she experiences tingling in her fingers, which she attributes to the colder weather. Despite the tingling, she maintains fine motor skills and can pick up small objects without difficulty.  Post-chemotherapy, she experiences a temporary loss of appetite and a general feeling of unwellness for a couple of days but does not feel nauseated to the point of needing medication. She also experiences diarrhea a few days after each chemotherapy session, which she manages with Imodium.  Despite these side effects, she reports that she recovers well after each chemotherapy session, maintains a stable weight, and continues to function normally. She remains active, taking care of her grandchildren and doing yard work.         All other systems were reviewed with the patient and are negative.  MEDICAL HISTORY:  Past Medical History:  Diagnosis Date   Aortic atherosclerosis (HCC)    Arthritis    feet, lower back   Basal cell carcinoma    arm   Breast cancer of upper-outer quadrant of left female breast (HCC) 06/04/2014   Cataract    immature on the left   Colon cancer (HCC) 08/2019   Diabetes mellitus without complication  (HCC)    Diverticulosis    Dizziness    > 31yrs ago;took Antivert    Family history of anesthesia complication    sister slow to wake up with anesthesia   Family history of breast cancer    Family history of colon cancer    Family history of uterine cancer    GERD (gastroesophageal reflux disease)    takes occasional TUMs   History of bronchitis    > 72yrs ago   History of colon polyps    History of hiatal hernia    Small noted on CT   History of pulmonary embolus (PE)    Hypertension    takes Losartan daily and HCTZ   Iron deficiency anemia    Joint pain    Numbness    to toes on each foot   Peripheral neuropathy    feet and toes   Personal history of radiation therapy    Pulmonary nodules    Noted on CT   Radiation 07/31/14-08/28/14   Left Breast 20 fxs   Seasonal allergies    takes Claritin prn   Urinary frequency    Vitamin D deficiency    takes VIt D daily    SURGICAL HISTORY: Past Surgical History:  Procedure Laterality Date   BREAST BIOPSY Bilateral    BREAST LUMPECTOMY Left    BREAST LUMPECTOMY WITH RADIOACTIVE SEED LOCALIZATION Left 07/01/2014   Procedure: LEFT BREAST LUMPECTOMY WITH RADIOACTIVE SEED LOCALIZATION;  Surgeon: Glenna Fellows, MD;  Location: [facility];  Service: General;  Laterality: Left;   CATARACT EXTRACTION Right    COLONOSCOPY  09/24/2019   [place]   LAPAROSCOPIC PARTIAL COLECTOMY N/A 11/01/2019   Procedure: LAPAROSCOPIC  PARTIAL COLECTOMY;  Surgeon: [name], MD;  Location: [facility];  Service: General;  Laterality: N/A;   POLYPECTOMY     PORTACATH PLACEMENT N/A 12/10/2019   Procedure: INSERTION PORT-A-CATH ULTRASOUND GUIDED IN RIGHT IJ;  Surgeon: [name], MD;  Location: [facility];  Service: General;  Laterality: N/A;   ROBOTIC ASSISTED TOTAL HYSTERECTOMY Bilateral 12/22/2021   Procedure: XI ROBOTIC ASSISTED TOTAL HYSTERECTOMY WITH BILATERAL SALPINGO OOPHORECTOMY;  Surgeon: Carver Fila, MD;  Location: [facility];  Service: Gynecology;  Laterality: Bilateral;   TOTAL KNEE ARTHROPLASTY Left 10/24/2012   Procedure: TOTAL KNEE ARTHROPLASTY;  Surgeon: Nestor Lewandowsky, MD;  Location: [facility];  Service: Orthopedics;  Laterality: Left;   TOTAL KNEE ARTHROPLASTY Right 01/09/2013   Procedure: TOTAL KNEE ARTHROPLASTY;  Surgeon: Nestor Lewandowsky, MD;  Location: [facility];  Service: Orthopedics;  Laterality: Right;   TUBAL LIGATION      I have reviewed the social history and family history with the patient and they are unchanged from previous note.  ALLERGIES:  is allergic to oxycodone.  MEDICATIONS:  Current Outpatient Medications  Medication Sig Dispense Refill   apixaban (ELIQUIS) 5 MG TABS tablet Take 1 tablet (5 mg total) by mouth 2 (two) times daily. 60 tablet 5   b complex vitamins capsule Take 1 capsule by mouth daily.     Cholecalciferol (VITAMIN D) 2000 UNITS CAPS Take 2,000 Units by mouth daily.      ibuprofen (ADVIL) 200 MG tablet Take 200 mg by mouth daily as needed (pain).     lidocaine-prilocaine (EMLA) cream Apply 1 Application topically as needed. 30 g 0   loratadine (CLARITIN) 10 MG tablet Take 10 mg by mouth daily as needed for allergies.     losartan (COZAAR) 25 MG tablet Take 1 tablet (25 mg total) by mouth daily. 90 tablet 3   metFORMIN (GLUCOPHAGE) 500 MG tablet Take 1 tablet (500 mg total) by mouth in the morning and at bedtime. 180 tablet 3   Multiple Vitamins-Minerals (MULTIVITAMIN WITH MINERALS) tablet Take 1 tablet by mouth daily.     omeprazole (PRILOSEC) 20 MG capsule Take 20 mg by mouth daily before breakfast.      ondansetron (ZOFRAN) 8 MG tablet Take 1 tablet (8 mg total) by mouth every 8 (eight) hours as needed for nausea or vomiting. Start on the third day after chemotherapy. 30 tablet 1   potassium chloride (KLOR-CON) 10 MEQ tablet Take 1 tablet (10 mEq total) by mouth 2 (two) times daily. 60 tablet 1   pravastatin (PRAVACHOL) 10 MG tablet Take 1 tablet (10 mg total) by mouth  daily. 90 tablet 3   pregabalin (LYRICA) 25 MG capsule Take 1 capsule (25 mg total) by mouth every morning. Take in addition to 50 mg at bedtime 60 capsule 2   pregabalin (LYRICA) 50 MG capsule Take 1 capsule (50 mg total) by mouth at bedtime. Take in addition to 25 mg in the morning 60 capsule 2   prochlorperazine (COMPAZINE) 10 MG tablet Take 1 tablet (10 mg total) by mouth every 6 (six) hours as needed for nausea or vomiting. 30 tablet 1   triamcinolone ointment (KENALOG) 0.5 % Apply 1 Application topically 2 (two) times daily. 30 g 0   No current facility-administered medications for this visit.    PHYSICAL EXAMINATION: ECOG PERFORMANCE STATUS: 1 - Symptomatic but completely ambulatory  Vitals:   03/13/23 1106  BP: (!) 153/61  Pulse: 63  Resp: 16  Temp: 97.8 F (  36.6 C)  SpO2: 99%   Wt Readings from Last 3 Encounters:  03/13/23 215 lb 6.4 oz (97.7 kg)  02/27/23 213 lb 1.6 oz (96.7 kg)  02/23/23 208 lb 6.4 oz (94.5 kg)     GENERAL:alert, no distress and comfortable SKIN: skin color, texture, turgor are normal, no rashes or significant lesions EYES: normal, Conjunctiva are pink and non-injected, sclera clear NECK: supple, thyroid normal size, non-tender, without nodularity LYMPH:  no palpable lymphadenopathy in the cervical, axillary  LUNGS: clear to auscultation and percussion with normal breathing effort HEART: regular rate & rhythm and no murmurs and no lower extremity edema ABDOMEN:abdomen soft, non-tender and normal bowel sounds Musculoskeletal:no cyanosis of digits and no clubbing  NEURO: alert & oriented x 3 with fluent speech, no focal motor/sensory deficits   LABORATORY DATA:  I have reviewed the data as listed    Latest Ref Rng & Units 03/13/2023   10:29 AM 02/27/2023    8:29 AM 02/13/2023    9:36 AM  CBC  WBC 4.0 - 10.5 K/uL 13.3  17.0  11.0   Hemoglobin 12.0 - 15.0 g/dL 16.1  09.6  04.5   Hematocrit 36.0 - 46.0 % 34.3  35.7  33.2   Platelets 150 - 400  K/uL 218  257  207         Latest Ref Rng & Units 03/13/2023   10:29 AM 02/27/2023    8:29 AM 02/13/2023    9:36 AM  CMP  Glucose 70 - 99 mg/dL 409  811  914   BUN 8 - 23 mg/dL 17  23  14    Creatinine 0.44 - 1.00 mg/dL 7.82  9.56  2.13   Sodium 135 - 145 mmol/L 143  142  140   Potassium 3.5 - 5.1 mmol/L 3.9  4.0  4.4   Chloride 98 - 111 mmol/L 111  112  109   CO2 22 - 32 mmol/L 26  23  26    Calcium 8.9 - 10.3 mg/dL 9.3  9.1  9.0   Total Protein 6.5 - 8.1 g/dL 6.2  6.4  6.3   Total Bilirubin <1.2 mg/dL 0.7  0.7  0.8   Alkaline Phos 38 - 126 U/L 138  131  131   AST 15 - 41 U/L 27  21  23    ALT 0 - 44 U/L 19  15  18        RADIOGRAPHIC STUDIES: I have personally reviewed the radiological images as listed and agreed with the findings in the report. No results found.    Orders Placed This Encounter  Procedures   CT CHEST ABDOMEN PELVIS W CONTRAST    Standing Status:   Future    Standing Expiration Date:   03/12/2024    Order Specific Question:   If indicated for the ordered procedure, I authorize the administration of contrast media per Radiology protocol    Answer:   Yes    Order Specific Question:   Does the patient have a contrast media/X-ray dye allergy?    Answer:   No    Order Specific Question:   Preferred imaging location?    Answer:   [facility]    Order Specific Question:   If indicated for the ordered procedure, I authorize the administration of oral contrast media per Radiology protocol    Answer:   Yes   CBC with Differential (Cancer Center Only)    Standing Status:   Future    Standing  Expiration Date:   05/14/2024   CMP (Cancer Center only)    Standing Status:   Future    Standing Expiration Date:   05/14/2024   All questions were answered. The patient knows to call the clinic with any problems, questions or concerns. No barriers to learning was detected. The total time spent in the appointment was 25 minutes.     [name], MD 03/13/2023

## 2023-03-13 NOTE — Patient Instructions (Signed)
 [facility] - A DEPT OF [facility]  Discharge Instructions: Thank you for choosing [facility] to provide your oncology and hematology care.   If you have a lab appointment with the Cancer Center, please go directly to the Cancer Center and check in at the registration area.   Wear comfortable clothing and clothing appropriate for easy access to any Portacath or PICC line.   We strive to give you quality time with your provider. You may need to reschedule your appointment if you arrive late (15 or more minutes).  Arriving late affects you and other patients whose appointments are after yours.  Also, if you miss three or more appointments without notifying the office, you may be dismissed from the clinic at the provider's discretion.      For prescription refill requests, have your pharmacy contact our office and allow 72 hours for refills to be completed.    Today you received the following chemotherapy and/or immunotherapy agents Mvasi, Irinotecan, Leucovorin, Fluorouracil      To help prevent nausea and vomiting after your treatment, we encourage you to take your nausea medication as directed.  BELOW ARE SYMPTOMS THAT SHOULD BE REPORTED IMMEDIATELY: *FEVER GREATER THAN 100.4 F (38 C) OR HIGHER *CHILLS OR SWEATING *NAUSEA AND VOMITING THAT IS NOT CONTROLLED WITH YOUR NAUSEA MEDICATION *UNUSUAL SHORTNESS OF BREATH *UNUSUAL BRUISING OR BLEEDING *URINARY PROBLEMS (pain or burning when urinating, or frequent urination) *BOWEL PROBLEMS (unusual diarrhea, constipation, pain near the anus) TENDERNESS IN MOUTH AND THROAT WITH OR WITHOUT PRESENCE OF ULCERS (sore throat, sores in mouth, or a toothache) UNUSUAL RASH, SWELLING OR PAIN  UNUSUAL VAGINAL DISCHARGE OR ITCHING   Items with * indicate a potential emergency and should be followed up as soon as possible or go to the Emergency Department if any problems should occur.  Please show the  CHEMOTHERAPY ALERT CARD or IMMUNOTHERAPY ALERT CARD at check-in to the Emergency Department and triage nurse.  Should you have questions after your visit or need to cancel or reschedule your appointment, please contact [facility] - A DEPT OF [facility]  Dept: [phone]  and follow the prompts.  Office hours are 8:00 a.m. to 4:30 p.m. Monday - Friday. Please note that voicemails left after 4:00 p.m. may not be returned until the following business day.  We are closed weekends and major holidays. You have access to a nurse at all times for urgent questions. Please call the main number to the clinic Dept: [phone] and follow the prompts.   For any non-urgent questions, you may also contact your provider using MyChart. We now offer e-Visits for anyone 21 and older to request care online for non-urgent symptoms. For details visit [email or web address].   Also download the MyChart app! Go to the app store, search "MyChart", open the app, select [facility], and log in with your MyChart username and password.

## 2023-03-13 NOTE — Progress Notes (Signed)
Nutrition Follow-up:  Patient with metastatic right colon cancer. Receiving bevacizumab and folfiri.    Met with patient during infusion. Reports appetite usually drops off this week then picks back up the following weeks before returning for another round of treatment.  Has intermittent diarrhea but controlled with imodium.  Experiences taste change with potatoes, breads so she is eating these less often.  Drinks water, fresca and lemonade.     Medications: reviewed  Labs: reviewed  Anthropometrics:   Weight 215 lb 6.4 oz today  216 lb 9.6 oz on 8/5 211 lb 12.8 oz on 6/10 213 lb on 4/22 221 lb on 2/26   NUTRITION DIAGNOSIS: Inadequate oral intake stable    INTERVENTION:  Continue well balanced diet including good sources of protein at each meal. RD available as needed    MONITORING, EVALUATION, GOAL: weight trends, intake   NEXT VISIT: as needed  Kim Castro, RD, LDN Registered Dietitian [phone]

## 2023-03-15 ENCOUNTER — Other Ambulatory Visit: Payer: Self-pay

## 2023-03-15 ENCOUNTER — Inpatient Hospital Stay: Payer: Medicare Other

## 2023-03-15 VITALS — BP 155/58 | HR 60 | Temp 98.3°F | Resp 17

## 2023-03-15 DIAGNOSIS — C787 Secondary malignant neoplasm of liver and intrahepatic bile duct: Secondary | ICD-10-CM | POA: Diagnosis not present

## 2023-03-15 DIAGNOSIS — Z95828 Presence of other vascular implants and grafts: Secondary | ICD-10-CM

## 2023-03-15 DIAGNOSIS — C182 Malignant neoplasm of ascending colon: Secondary | ICD-10-CM

## 2023-03-15 DIAGNOSIS — I10 Essential (primary) hypertension: Secondary | ICD-10-CM | POA: Diagnosis not present

## 2023-03-15 DIAGNOSIS — Z5189 Encounter for other specified aftercare: Secondary | ICD-10-CM | POA: Diagnosis not present

## 2023-03-15 DIAGNOSIS — C786 Secondary malignant neoplasm of retroperitoneum and peritoneum: Secondary | ICD-10-CM | POA: Diagnosis not present

## 2023-03-15 DIAGNOSIS — C7963 Secondary malignant neoplasm of bilateral ovaries: Secondary | ICD-10-CM | POA: Diagnosis not present

## 2023-03-15 DIAGNOSIS — Z5111 Encounter for antineoplastic chemotherapy: Secondary | ICD-10-CM | POA: Diagnosis not present

## 2023-03-15 MED ORDER — PEGFILGRASTIM-CBQV 6 MG/0.6ML ~~LOC~~ SOSY
6.0000 mg | PREFILLED_SYRINGE | Freq: Once | SUBCUTANEOUS | Status: DC
Start: 2023-03-15 — End: 2023-03-15
  Filled 2023-03-15: qty 0.6

## 2023-03-15 MED ORDER — HEPARIN SOD (PORK) LOCK FLUSH 100 UNIT/ML IV SOLN
500.0000 [IU] | Freq: Once | INTRAVENOUS | Status: AC | PRN
  Administered 2023-03-15: 500 [IU]

## 2023-03-15 MED ORDER — SODIUM CHLORIDE 0.9% FLUSH
10.0000 mL | INTRAVENOUS | Status: DC | PRN
  Administered 2023-03-15: 10 mL

## 2023-03-15 MED ORDER — PEGFILGRASTIM INJECTION 6 MG/0.6ML ~~LOC~~
6.0000 mg | PREFILLED_SYRINGE | Freq: Once | SUBCUTANEOUS | Status: AC
Start: 2023-03-15 — End: 2023-03-15
  Administered 2023-03-15: 6 mg via SUBCUTANEOUS
  Filled 2023-03-15: qty 0.6

## 2023-03-22 NOTE — Progress Notes (Signed)
Udenyca not available in stock; shortage.  OK'd per PA team to substitute Neulasta.  Ebony Hail, Pharm.D., CPP 03/22/2023@9 :31 AM

## 2023-03-23 ENCOUNTER — Ambulatory Visit (HOSPITAL_COMMUNITY)
Admission: RE | Admit: 2023-03-23 | Discharge: 2023-03-23 | Disposition: A | Discharge status: Home / Self Care | Payer: Medicare Other | Source: Ambulatory Visit | Attending: Hematology | Admitting: Hematology

## 2023-03-23 DIAGNOSIS — C182 Malignant neoplasm of ascending colon: Secondary | ICD-10-CM

## 2023-03-23 DIAGNOSIS — N281 Cyst of kidney, acquired: Secondary | ICD-10-CM | POA: Diagnosis not present

## 2023-03-23 DIAGNOSIS — C189 Malignant neoplasm of colon, unspecified: Secondary | ICD-10-CM | POA: Diagnosis not present

## 2023-03-23 DIAGNOSIS — N3289 Other specified disorders of bladder: Secondary | ICD-10-CM | POA: Diagnosis not present

## 2023-03-23 MED ORDER — IOHEXOL 9 MG/ML PO SOLN
ORAL | Status: AC
  Filled 2023-03-23: qty 1000

## 2023-03-23 MED ORDER — HEPARIN SOD (PORK) LOCK FLUSH 100 UNIT/ML IV SOLN
500.0000 [IU] | Freq: Once | INTRAVENOUS | Status: AC
  Administered 2023-03-23: 500 [IU] via INTRAVENOUS

## 2023-03-23 MED ORDER — IOHEXOL 300 MG/ML  SOLN
100.0000 mL | Freq: Once | INTRAMUSCULAR | Status: AC | PRN
  Administered 2023-03-23: 100 mL via INTRAVENOUS

## 2023-03-23 MED ORDER — IOHEXOL 9 MG/ML PO SOLN
1000.0000 mL | ORAL | Status: AC
  Administered 2023-03-23: 1000 mL via ORAL

## 2023-03-25 NOTE — Assessment & Plan Note (Signed)
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 Path showed overall Stage IIIB cancer.  -s/p 6 months adjuvant FOLFOX, completed 06/01/20, oxaliplatin held for final 2 cycles due to neuropathy.  -s/p BSO and hysterectomy on 12/22/21 with Dr. Pricilla Holm. Path revealed metastatic moderately differentiated colonic adenocarcinoma involving both ovaries and peritoneum -she restarted FOLFOX on 01/24/22. -Due to disease progression, chemotherapy changed to second line FOLFIRI and bevacizumab on April 06, 2022. She has been tolerating moderately well, better now with dose reduction, will continue -Restaging CT scan from 09/27/2022 showed continous response in liver and peritoneal metastasis, no other new lesions.   -We discussed maintenance therapy option in future  -due to her PE in 10/2022, will hold beva for 3 months  -restaging CT 12/31/2022 showed stable disease and live met is not well defined on CT. She restarted beva on 01/17/23

## 2023-03-27 ENCOUNTER — Inpatient Hospital Stay: Payer: Medicare Other

## 2023-03-27 ENCOUNTER — Inpatient Hospital Stay: Payer: Medicare Other | Admitting: Hematology

## 2023-03-27 ENCOUNTER — Encounter: Payer: Self-pay | Admitting: Hematology

## 2023-03-27 VITALS — BP 134/59 | HR 62 | Temp 97.3°F | Resp 18 | Ht 65.5 in | Wt 212.4 lb

## 2023-03-27 DIAGNOSIS — C787 Secondary malignant neoplasm of liver and intrahepatic bile duct: Secondary | ICD-10-CM | POA: Diagnosis not present

## 2023-03-27 DIAGNOSIS — C182 Malignant neoplasm of ascending colon: Secondary | ICD-10-CM

## 2023-03-27 DIAGNOSIS — I10 Essential (primary) hypertension: Secondary | ICD-10-CM | POA: Diagnosis not present

## 2023-03-27 DIAGNOSIS — Z5189 Encounter for other specified aftercare: Secondary | ICD-10-CM | POA: Diagnosis not present

## 2023-03-27 DIAGNOSIS — C7963 Secondary malignant neoplasm of bilateral ovaries: Secondary | ICD-10-CM | POA: Diagnosis not present

## 2023-03-27 DIAGNOSIS — Z5111 Encounter for antineoplastic chemotherapy: Secondary | ICD-10-CM | POA: Diagnosis not present

## 2023-03-27 DIAGNOSIS — Z95828 Presence of other vascular implants and grafts: Secondary | ICD-10-CM

## 2023-03-27 DIAGNOSIS — C786 Secondary malignant neoplasm of retroperitoneum and peritoneum: Secondary | ICD-10-CM | POA: Diagnosis not present

## 2023-03-27 LAB — CBC WITH DIFFERENTIAL (CANCER CENTER ONLY)
Abs Immature Granulocytes: 0.45 10*3/uL — ABNORMAL HIGH (ref 0.00–0.07)
Basophils Absolute: 0.1 10*3/uL (ref 0.0–0.1)
Basophils Relative: 1 %
Eosinophils Absolute: 0.2 10*3/uL (ref 0.0–0.5)
Eosinophils Relative: 2 %
HCT: 34.5 % — ABNORMAL LOW (ref 36.0–46.0)
Hemoglobin: 10.9 g/dL — ABNORMAL LOW (ref 12.0–15.0)
Immature Granulocytes: 4 %
Lymphocytes Relative: 29 %
Lymphs Abs: 3 10*3/uL (ref 0.7–4.0)
MCH: 32.2 pg (ref 26.0–34.0)
MCHC: 31.6 g/dL (ref 30.0–36.0)
MCV: 102.1 fL — ABNORMAL HIGH (ref 80.0–100.0)
Monocytes Absolute: 0.7 10*3/uL (ref 0.1–1.0)
Monocytes Relative: 7 %
Neutro Abs: 6 10*3/uL (ref 1.7–7.7)
Neutrophils Relative %: 57 %
Platelet Count: 242 10*3/uL (ref 150–400)
RBC: 3.38 MIL/uL — ABNORMAL LOW (ref 3.87–5.11)
RDW: 16.9 % — ABNORMAL HIGH (ref 11.5–15.5)
WBC Count: 10.5 10*3/uL (ref 4.0–10.5)
nRBC: 0 % (ref 0.0–0.2)

## 2023-03-27 LAB — CMP (CANCER CENTER ONLY)
ALT: 18 U/L (ref 0–44)
AST: 22 U/L (ref 15–41)
Albumin: 3.8 g/dL (ref 3.5–5.0)
Alkaline Phosphatase: 138 U/L — ABNORMAL HIGH (ref 38–126)
Anion gap: 7 (ref 5–15)
BUN: 14 mg/dL (ref 8–23)
CO2: 25 mmol/L (ref 22–32)
Calcium: 9 mg/dL (ref 8.9–10.3)
Chloride: 111 mmol/L (ref 98–111)
Creatinine: 0.8 mg/dL (ref 0.44–1.00)
GFR, Estimated: >60 mL/min (ref >60)
Glucose, Bld: 110 mg/dL — ABNORMAL HIGH (ref 70–99)
Potassium: 3.5 mmol/L (ref 3.5–5.1)
Sodium: 143 mmol/L (ref 135–145)
Total Bilirubin: 0.7 mg/dL (ref <1.2)
Total Protein: 6.3 g/dL — ABNORMAL LOW (ref 6.5–8.1)

## 2023-03-27 LAB — CEA (ACCESS): CEA (CHCC): 7.01 ng/mL — ABNORMAL HIGH (ref 0.00–5.00)

## 2023-03-27 MED ORDER — SODIUM CHLORIDE 0.9 % IV SOLN
2400.0000 mg/m2 | INTRAVENOUS | Status: AC
Start: 2023-03-27 — End: 2023-03-29
  Administered 2023-03-27: 5000 mg via INTRAVENOUS
  Filled 2023-03-27: qty 100

## 2023-03-27 MED ORDER — PALONOSETRON HCL INJECTION 0.25 MG/5ML
0.2500 mg | Freq: Once | INTRAVENOUS | Status: AC
  Administered 2023-03-27: 0.25 mg via INTRAVENOUS
  Filled 2023-03-27: qty 5

## 2023-03-27 MED ORDER — DEXAMETHASONE SODIUM PHOSPHATE 10 MG/ML IJ SOLN
10.0000 mg | Freq: Once | INTRAMUSCULAR | Status: AC
  Administered 2023-03-27: 10 mg via INTRAVENOUS
  Filled 2023-03-27: qty 1

## 2023-03-27 MED ORDER — SODIUM CHLORIDE 0.9 % IV SOLN
5.0000 mg/kg | Freq: Once | INTRAVENOUS | Status: AC
  Administered 2023-03-27: 500 mg via INTRAVENOUS
  Filled 2023-03-27: qty 4

## 2023-03-27 MED ORDER — SODIUM CHLORIDE 0.9 % IV SOLN: Freq: Once | INTRAVENOUS | Status: AC

## 2023-03-27 MED ORDER — SODIUM CHLORIDE 0.9 % IV SOLN
100.0000 mg/m2 | Freq: Once | INTRAVENOUS | Status: AC
  Administered 2023-03-27: 220 mg via INTRAVENOUS
  Filled 2023-03-27: qty 11

## 2023-03-27 MED ORDER — SODIUM CHLORIDE 0.9% FLUSH
10.0000 mL | INTRAVENOUS | Status: DC | PRN
  Administered 2023-03-27: 10 mL

## 2023-03-27 MED ORDER — SODIUM CHLORIDE 0.9 % IV SOLN
400.0000 mg/m2 | Freq: Once | INTRAVENOUS | Status: AC
  Administered 2023-03-27: 896 mg via INTRAVENOUS
  Filled 2023-03-27: qty 44.8

## 2023-03-27 MED ORDER — ATROPINE SULFATE 1 MG/ML IV SOLN
0.5000 mg | Freq: Once | INTRAVENOUS | Status: AC | PRN
  Administered 2023-03-27: 0.5 mg via INTRAVENOUS
  Filled 2023-03-27: qty 1

## 2023-03-27 NOTE — Progress Notes (Signed)
[facility]   [phone] [phone]   Clinic Follow up Note   Castro Care Team: [name], MD as PCP - General (Family Medicine) [name], MD as Consulting Physician (General Surgery) [name], MD as Consulting Physician (Hematology) [name], MD as Consulting Physician (General Surgery) [name], MD as Consulting Physician (Hematology and Oncology)  Date of Service:  03/27/2023  CHIEF COMPLAINT: f/u of metastatic colon cancer  CURRENT THERAPY:  FOLFIRI and bevacizumab  Oncology History   Malignant neoplasm of ascending colon (HCC) pT3N2aM0 stage IIB, MSS, metastatic to b/l ovaries and peritoneum and liver, KRAS mutation G12V (+)  -Initially diagnosed in 08/2019, s/p resection with Dr Maisie Fus on 11/01/19. Path showed overall Stage IIIB cancer.  -s/p 6 months adjuvant FOLFOX, completed 06/01/20, oxaliplatin held for final 2 cycles due to neuropathy.  -s/p BSO and hysterectomy on 12/22/21 with Dr. Pricilla Holm. Path revealed metastatic moderately differentiated colonic adenocarcinoma involving both ovaries and peritoneum -she restarted FOLFOX on 01/24/22. -Due to disease progression, chemotherapy changed to second line FOLFIRI and bevacizumab on April 06, 2022. She has been tolerating moderately well, better now with dose reduction, will continue -Restaging CT scan from 09/27/2022 showed continous response in liver and peritoneal metastasis, no other new lesions.   -We discussed maintenance therapy option in future  -due to her PE in 10/2022, will hold beva for 3 months  -restaging CT 12/31/2022 showed stable disease and live met is not well defined on CT. She restarted beva on 01/17/23    Assessment and Plan    Metastatic Colon Cancer 77 year old with metastatic colon cancer. Pending CT scan results, I personally reviewed the CT scan images with Castro, overall stable disease to me; previous scans showed stable lung and abdominal  nodules, no significant liver changes. Slightly elevated CA tumor marker. Hemoglobin at 10.9. Reports well-managed energy levels and appetite. Discussed monitoring for new symptoms and potential treatment adjustments based on CT results. Emphasized that stable disease indicates effective current treatment. Discussed risks of disease progression and importance of regular follow-ups. - Await CT scan report and review results - Continue current treatment regimen - Schedule next infusion cycles on December 9 and December 31 - Monitor hemoglobin and other blood counts regularly - Follow up on any new symptoms or changes  Epistaxis Minor epistaxis this morning, resolved spontaneously. Likely due to nasal irritation or dryness. - Monitor for recurrence - Advise on nasal care to prevent dryness and irritation  History of PE -Diagnosed in June 2024  -Likely secondary to malignancy and chemotherapy -Continue Eliquis indefinitely, she is tolerating well.  Plan -Lab reviewed, adequate for treatment, will proceed at the same dose today and continue chemo every 2 weeks - Review CT scan results once available - Next infusion cycles on December 9 and December 31 -Follow-up in 2 weeks before next cycle chemo     SUMMARY OF ONCOLOGIC HISTORY: Oncology History Overview Note   Cancer Staging  Malignant neoplasm of female breast [facility]) Staging form: Breast, AJCC 7th Edition - Clinical stage from 06/11/2014: Stage 0 (Tis (DCIS), N0, M0) - Unsigned Staged by: Pathologist and managing physician Laterality: Left Estrogen receptor status: Positive Progesterone receptor status: Positive Stage used in treatment planning: Yes National guidelines used in treatment planning: Yes Type of national guideline used in treatment planning: NCCN - Pathologic stage from 07/03/2014: Stage Unknown (Tis (DCIS), NX, cM0) - Signed by [name], MD on 07/10/2014 Staged by: Pathologist Laterality: Left Estrogen receptor  status:  Positive Progesterone receptor status: Positive Stage used in treatment planning: Yes National guidelines used in treatment planning: Yes Type of national guideline used in treatment planning: NCCN Staging comments: Staged on final lumpectomy specimen by Dr. [name].  Right colon cancer Staging form: Colon and Rectum, AJCC 8th Edition - Pathologic stage from 11/01/2019: Stage IIIB (pT3, pN2a, cM0) - Signed by [name], MD on 11/29/2019 Stage prefix: Initial diagnosis Histologic grading system: 4 grade system Histologic grade (G): G2 Residual tumor (R): R0 - None Tumor deposits (TD): Absent Perineural invasion (PNI): Absent Microsatellite instability (MSI): Stable KRAS mutation: Unknown NRAS mutation: Unknown BRAF mutation: Unknown     Malignant neoplasm of female breast (HCC)  05/29/2014 Initial Biopsy   Left breast needle core biopsy: Grade 2, DCIS with calcs. ER+ (100%), PR+ (96%).    06/04/2014 Initial Diagnosis   Left breast DCIS with calcifications, ER 100%, PR 96%   06/10/2014 Breast MRI   Left breast: 2.4 x 1.3 x 1.1 cm area of patchy non-mass enhancement upper outer quadrant includes postbiopsy seroma; Right breast: 1.2 cm previously biopsied stable benign fibroadenoma   06/12/2014 Procedure   Genetic counseling/testing: Identified 1 VUS on CHEK2 gene. Remainder of 17 gene panel tested negative and included: ATM, BARD1, BRCA 1/2, BRIP1, CDH1, CHEK2, EPCAM, MLH1, MSH2, MSH6, NBN, NF1, PALB2, PTEN, RAD50, RAD51C, RAD51D, STK11, and TP53.    07/01/2014 Surgery   Left breast lumpectomy ([facility]): Grade 1, DCIS, spanning 2.3 cm, 1 mm margin, ER 100%, PR 96%   07/31/2014 - 08/28/2014 Radiation Therapy   Adjuvant RT completed [name]). Left breast: Total dose 42.5 Gy over 17 fractions. Left breast boost: Total dose 7.5 Gy over 3 fractions.    09/14/2014 - 01/13/2020 Anti-estrogen oral therapy   Anastrazole 1mg  daily. Planned duration of treatment: 5 years [place]). Completed in  01/2020.    09/25/2014 Survivorship   Survivorship Care Plan given to Castro and reviewed with her in person.    03/02/2021 Imaging   CT CAP  IMPRESSION: 1. No findings of active/recurrent malignancy. Partial right hemicolectomy. 2. Endometrial stripe remains mildly thickened, but endometrial biopsy in May was negative for malignancy. 3. Progressive endplate sclerosis and endplate irregularity at T2-3, probably due to degenerative endplate findings. If the has referable upper thoracic pain/symptoms then thoracic spine MRI could be used for further workup. 4. Other imaging findings of potential clinical significance: Mild cardiomegaly. Aortic Atherosclerosis (ICD10-I70.0). Mild mitral valve calcification. Postoperative findings in the left breast with adjacent radiation port anteriorly in the left upper lobe. Tiny pulmonary nodules in the left lower lobe are unchanged from earliest available comparison of 10/17/2019 and probably benign although may merit surveillance. Multilevel lumbar impingement. Mild pelvic floor laxity.   Malignant neoplasm of ascending colon (HCC)  09/19/2019 Imaging   CT AP W contrast 09/19/19  IMPRESSION Fullness in the cecum, cannot exclude a mass. No evidence for metastatic disease is identified.    09/24/2019 Procedure   Colonoscopy by Dr [name] 09/24/19 IMPRESSION 1. The colon was redundant  2. Mild diverticulosis was noted through the entire examined colon 3. Single 12mm polyp was found in the ascending colon; polypectomy was performed using snare cautery and biopsy forceps 4. Mild diverticulosis was notes in the descending colon and sigmoid colon.  5. Single polyp was found in the sigmoid colon, polypectomy was performed with cold forceps.  6. Single polyp was found in the rectosigmoid colon; polypectomy was performed with cold snare  7. Small internal hemorrhoids  8. Large mass  was found at the cecum; multiple biopsies of the area were performed using cold  forceps; injection (tattooing) was performed distal to the mass.    09/24/2019 Initial Biopsy   INTERPRETATION AND DIAGNOSIS:  A. Cecum, biopsy:  Invasive moderately differentiated adenocarcinoma.  see comment  B. Polyp @ ascending colon, polypectomy:  Tubular Adenoma  C. Polyp @ sigmoid colon Polypectomy:  hyperplastic polyp.  D. Polyp @ rectosigmoid colon, Polypectomy:  Hyperplastic Polyp      10/16/2019 Imaging   CT Chest IMPRESSION: 1. Multiple small pulmonary nodules measuring 5 mm or less in size in the lungs. These are nonspecific and are typically considered statistically likely benign. However, given the Castro's history of primary malignancy, close attention on follow-up studies is recommended to ensure stability. 2. Aortic atherosclerosis, in addition to right coronary artery disease. Assessment for potential risk factor modification, dietary therapy or pharmacologic therapy may be warranted, if clinically indicated. 3. There are calcifications of the aortic valve and mitral annulus. Echocardiographic correlation for evaluation of potential valvular dysfunction may be warranted if clinically indicated. 4. Small hiatal hernia.   Aortic Atherosclerosis (ICD10-I70.0).   11/01/2019 Initial Diagnosis   Colon cancer (HCC)   11/01/2019 Surgery   LAPAROSCOPIC PARTIAL COLECTOMY by Dr Maisie Fus and Dr Michaell Cowing   11/01/2019 Pathology Results   FINAL MICROSCOPIC DIAGNOSIS:   A. COLON, PROXIMAL RIGHT, COLECTOMY:  - Invasive colonic adenocarcinoma, 5 cm.  - Tumor invades through the muscularis propria into pericolonic tissues.   - Margins of resection are not involved.  - Metastatic carcinoma in (5) of (13) lymph nodes.  - See oncology table.    MSI Stable  Mismatch repair normal  MLH1 - Preserved nuclear expression (greater 50% tumor expression) MSH2 - Preserved nuclear expression (greater 50% tumor expression) MSH6 - Preserved nuclear expression (greater 50% tumor  expression) PMS2 - Preserved nuclear expression (greater 50% tumor expression)   11/01/2019 Cancer Staging   Staging form: Colon and Rectum, AJCC 8th Edition - Pathologic stage from 11/01/2019: Stage IIIB (pT3, pN2a, cM0) - Signed by [name], MD on 11/29/2019   12/10/2019 Procedure   PAC placed 12/10/19   12/17/2019 - 06/01/2020 Chemotherapy   FOLFOX q2weeks starting in 2 weeks starting 12/17/19. Held 01/27/20-02/10/20 due to b/l PE. Oxaliplatin held C11-12 due to neuropathy. Completed on 06/01/20   03/02/2021 Imaging   CT CAP  IMPRESSION: 1. No findings of active/recurrent malignancy. Partial right hemicolectomy. 2. Endometrial stripe remains mildly thickened, but endometrial biopsy in May was negative for malignancy. 3. Progressive endplate sclerosis and endplate irregularity at T2-3, probably due to degenerative endplate findings. If the has referable upper thoracic pain/symptoms then thoracic spine MRI could be used for further workup. 4. Other imaging findings of potential clinical significance: Mild cardiomegaly. Aortic Atherosclerosis (ICD10-I70.0). Mild mitral valve calcification. Postoperative findings in the left breast with adjacent radiation port anteriorly in the left upper lobe. Tiny pulmonary nodules in the left lower lobe are unchanged from earliest available comparison of 10/17/2019 and probably benign although may merit surveillance. Multilevel lumbar impingement. Mild pelvic floor laxity.   08/26/2021 Imaging   EXAM: CT CHEST, ABDOMEN, AND PELVIS WITH CONTRAST  IMPRESSION: 1. Stable examination without new or progressive findings to suggest local recurrence or metastatic disease within the chest, abdomen, or pelvis. 2. Hepatomegaly with hepatic steatosis. 3. Sigmoid colonic diverticulosis without findings of acute diverticulitis. 4. Similar prominent endplate sclerosis and irregularity at T2-T3 is most consistent with Modic type endplate degenerative changes. However,  if Castro has  referable upper thoracic pain consider further workup with thoracic spine MRI. 5. Similar thickening of the endometrial stripe measuring up to 8 mm, which was previously biopsied with results negative for malignancy. 6. Aortic Atherosclerosis (ICD10-I70.0).   11/10/2021 PET scan   IMPRESSION: 1. LEFT ovary is increased in size and is intensely hypermetabolic. While physiologic hypermetabolic ovarian tissue is not uncommon, the enlargement and asymmetric activity warrants further evaluation. Consider contrast pelvic MRI vs tissue sampling. 2. No evidence of metastatic colorectal carcinoma otherwise. 3. Post RIGHT hemicolectomy anatomy. 4. Evidence of radiation change in the LEFT upper lobe (remote breast cancer).     12/22/2021 Relapse/Recurrence    FINAL MICROSCOPIC DIAGNOSIS:   A. LEFT OVARY AND FALLOPIAN TUBE, SALPINGO OOPHORECTOMY:  - Metastatic moderately differentiated colonic adenocarcinoma involving left ovary  - Focal ovarian stromal calcification  - Segment of benign fallopian tube   B. UTERUS WITH RIGHT FALLOPIAN TUBE AND OVARY, HYSTERECTOMY AND SALPINGO-OOPHORECTOMY:  - Metastatic moderately differentiated colonic adenocarcinoma involving right ovary  - Focal invasive extensive adenomyosis  - Benign endometrial polyps  - Benign proliferative phase endometrium  - Hydrosalpinx of right fallopian tube  - Focal ovarian stromal calcification   C. PERITONEAL DEPOSITS, ANTERIOR CUL DE SAC, BIOPSY:  - Metastatic mucinous adenocarcinoma, consistent with colorectal primary    COMMENT:  Immunohistochemical stains show that the tumor cells are positive for CK20 and CDX2 while they are negative for CK7 and PAX8, consistent with above interpretation.    01/24/2022 - 03/23/2022 Chemotherapy   Castro is on Treatment Plan : COLORECTAL FOLFOX q14d x 3 months     04/01/2022 Imaging   CT AP IMPRESSION: 1. New omental metastatic disease. 2. Vague hypoattenuating lesion  in the inferior right hepatic lobe is new and also worrisome for metastatic disease. 3. Similar small right lower lobe nodules. Recommend continued attention on follow-up. 4. Steatotic enlarged liver. 5.  Aortic atherosclerosis (ICD10-I70.0).   04/06/2022 -  Chemotherapy   Castro is on Treatment Plan : COLORECTAL FOLFIRI + Bevacizumab q14d     04/21/2022 Imaging    IMPRESSION: 1. Stable chest CT. No evidence of metastatic disease. 2. Stable small pulmonary nodules, considered benign based on stability. 3. Stable postsurgical changes in the left breast and anterior left upper lobe radiation changes. 4. Aortic Atherosclerosis (ICD10-I70.0) and Emphysema (ICD10-J43.9).   04/21/2022 Imaging    IMPRESSION: 1. Small enhancing lesion inferiorly in the right hepatic lobe is typical of metastatic disease and stable from recent abdominal CT. No other definite liver lesions are identified. Mild hepatic steatosis. 2. No other significant abdominal findings. 3. Omental nodularity seen on CT is not well visualized by MRI.    Imaging     06/23/2022 Imaging    IMPRESSION: 1. Hypodense lesion of the inferior right lobe of the liver, hepatic segment VI is diminished in size, consistent with treatment response. 2. Tiny peritoneal and omental nodules identified by prior examination are diminished in size, consistent with treatment response. 3. Multiple tiny pulmonary nodules unchanged, most likely benign and incidental, however continued attention on follow-up warranted in the setting of known metastatic disease. 4. No evidence of new metastatic disease in the chest, abdomen, or pelvis. 5. Status post partial right hemicolectomy and reanastomosis. 6. Diffuse mosaic attenuation of the airspaces, consistent with small airways disease. 7. Hepatomegaly.      Discussed the use of AI scribe software for clinical note transcription with the Castro, who gave verbal consent to  proceed.  History of Present Illness  A Kim Castro with a known history of metastatic colon cancer presents for a routine follow-up visit. The Castro reports no significant changes in her health since her last visit two weeks ago. She mentions a minor nosebleed that occurred earlier in the day, which she attributes to blowing her nose. She denies any other bleeding. Her energy levels remain consistent, and she reports a good appetite. Her weight has fluctuated slightly, but she is not overly concerned. She also mentions that her blood sugars have been well-controlled, even during the recent Halloween holiday when she indulged in candy. She is planning to cook for her family for the upcoming Thanksgiving holiday and is considering taking them out for [place] food for Christmas. She reports that she increased her vitamin D intake about six weeks ago due to noticing a slight decrease in her levels. She has not made any other changes to her medication regimen.         All other systems were reviewed with the Castro and are negative.  MEDICAL HISTORY:  Past Medical History:  Diagnosis Date   Aortic atherosclerosis (HCC)    Arthritis    feet, lower back   Basal cell carcinoma    arm   Breast cancer of upper-outer quadrant of left female breast (HCC) 06/04/2014   Cataract    immature on the left   Colon cancer (HCC) 08/2019   Diabetes mellitus without complication (HCC)    Diverticulosis    Dizziness    > 30yrs ago;took Antivert    Family history of anesthesia complication    sister slow to wake up with anesthesia   Family history of breast cancer    Family history of colon cancer    Family history of uterine cancer    GERD (gastroesophageal reflux disease)    takes occasional TUMs   History of bronchitis    > 7yrs ago   History of colon polyps    History of hiatal hernia    Small noted on CT   History of pulmonary embolus (PE)    Hypertension    takes Losartan daily and  HCTZ   Iron deficiency anemia    Joint pain    Numbness    to toes on each foot   Peripheral neuropathy    feet and toes   Personal history of radiation therapy    Pulmonary nodules    Noted on CT   Radiation 07/31/14-08/28/14   Left Breast 20 fxs   Seasonal allergies    takes Claritin prn   Urinary frequency    Vitamin D deficiency    takes VIt D daily    SURGICAL HISTORY: Past Surgical History:  Procedure Laterality Date   BREAST BIOPSY Bilateral    BREAST LUMPECTOMY Left    BREAST LUMPECTOMY WITH RADIOACTIVE SEED LOCALIZATION Left 07/01/2014   Procedure: LEFT BREAST LUMPECTOMY WITH RADIOACTIVE SEED LOCALIZATION;  Surgeon: Glenna Fellows, MD;  Location: [facility];  Service: General;  Laterality: Left;   CATARACT EXTRACTION Right    COLONOSCOPY  09/24/2019   [place]   LAPAROSCOPIC PARTIAL COLECTOMY N/A 11/01/2019   Procedure: LAPAROSCOPIC PARTIAL COLECTOMY;  Surgeon: [name], MD;  Location: [facility];  Service: General;  Laterality: N/A;   POLYPECTOMY     PORTACATH PLACEMENT N/A 12/10/2019   Procedure: INSERTION PORT-A-CATH ULTRASOUND GUIDED IN RIGHT IJ;  Surgeon: [name], MD;  Location: [facility];  Service: General;  Laterality: N/A;   ROBOTIC ASSISTED TOTAL HYSTERECTOMY Bilateral 12/22/2021  Procedure: XI ROBOTIC ASSISTED TOTAL HYSTERECTOMY WITH BILATERAL SALPINGO OOPHORECTOMY;  Surgeon: Carver Fila, MD;  Location: [facility];  Service: Gynecology;  Laterality: Bilateral;   TOTAL KNEE ARTHROPLASTY Left 10/24/2012   Procedure: TOTAL KNEE ARTHROPLASTY;  Surgeon: Nestor Lewandowsky, MD;  Location: [facility];  Service: Orthopedics;  Laterality: Left;   TOTAL KNEE ARTHROPLASTY Right 01/09/2013   Procedure: TOTAL KNEE ARTHROPLASTY;  Surgeon: Nestor Lewandowsky, MD;  Location: [facility];  Service: Orthopedics;  Laterality: Right;   TUBAL LIGATION      I have reviewed the social history and family history with the Castro and they are unchanged from previous  note.  ALLERGIES:  is allergic to oxycodone.  MEDICATIONS:  Current Outpatient Medications  Medication Sig Dispense Refill   apixaban (ELIQUIS) 5 MG TABS tablet Take 1 tablet (5 mg total) by mouth 2 (two) times daily. 60 tablet 5   b complex vitamins capsule Take 1 capsule by mouth daily.     Cholecalciferol (VITAMIN D) 2000 UNITS CAPS Take 2,000 Units by mouth daily.      ibuprofen (ADVIL) 200 MG tablet Take 200 mg by mouth daily as needed (pain).     lidocaine-prilocaine (EMLA) cream Apply 1 Application topically as needed. 30 g 0   loratadine (CLARITIN) 10 MG tablet Take 10 mg by mouth daily as needed for allergies.     losartan (COZAAR) 25 MG tablet Take 1 tablet (25 mg total) by mouth daily. 90 tablet 3   metFORMIN (GLUCOPHAGE) 500 MG tablet Take 1 tablet (500 mg total) by mouth in the morning and at bedtime. 180 tablet 3   Multiple Vitamins-Minerals (MULTIVITAMIN WITH MINERALS) tablet Take 1 tablet by mouth daily.     omeprazole (PRILOSEC) 20 MG capsule Take 20 mg by mouth daily before breakfast.      ondansetron (ZOFRAN) 8 MG tablet Take 1 tablet (8 mg total) by mouth every 8 (eight) hours as needed for nausea or vomiting. Start on the third day after chemotherapy. 30 tablet 1   potassium chloride (KLOR-CON) 10 MEQ tablet Take 1 tablet (10 mEq total) by mouth 2 (two) times daily. 60 tablet 1   pravastatin (PRAVACHOL) 10 MG tablet Take 1 tablet (10 mg total) by mouth daily. 90 tablet 3   pregabalin (LYRICA) 25 MG capsule Take 1 capsule (25 mg total) by mouth every morning. Take in addition to 50 mg at bedtime 60 capsule 2   pregabalin (LYRICA) 50 MG capsule Take 1 capsule (50 mg total) by mouth at bedtime. Take in addition to 25 mg in the morning 60 capsule 2   prochlorperazine (COMPAZINE) 10 MG tablet Take 1 tablet (10 mg total) by mouth every 6 (six) hours as needed for nausea or vomiting. 30 tablet 1   triamcinolone ointment (KENALOG) 0.5 % Apply 1 Application topically 2 (two) times  daily. 30 g 0   No current facility-administered medications for this visit.    PHYSICAL EXAMINATION: ECOG PERFORMANCE STATUS: 1 - Symptomatic but completely ambulatory  Vitals:   03/27/23 0904  BP: (!) 134/59  Pulse: 62  Resp: 18  Temp: (!) 97.3 F (36.3 C)  SpO2: 99%   Wt Readings from Last 3 Encounters:  03/27/23 212 lb 6 oz (96.3 kg)  03/13/23 215 lb 6.4 oz (97.7 kg)  02/27/23 213 lb 1.6 oz (96.7 kg)     GENERAL:alert, no distress and comfortable SKIN: skin color, texture, turgor are normal, no rashes or significant lesions EYES: normal, Conjunctiva are  pink and non-injected, sclera clear NECK: supple, thyroid normal size, non-tender, without nodularity LYMPH:  no palpable lymphadenopathy in the cervical, axillary  LUNGS: clear to auscultation and percussion with normal breathing effort HEART: regular rate & rhythm and no murmurs and no lower extremity edema ABDOMEN:abdomen soft, non-tender and normal bowel sounds Musculoskeletal:no cyanosis of digits and no clubbing, mild bilateral leg edema NEURO: alert & oriented x 3 with fluent speech, no focal motor/sensory deficits   LABORATORY DATA:  I have reviewed the data as listed    Latest Ref Rng & Units 03/27/2023    8:19 AM 03/13/2023   10:29 AM 02/27/2023    8:29 AM  CBC  WBC 4.0 - 10.5 K/uL 10.5  13.3  17.0   Hemoglobin 12.0 - 15.0 g/dL 40.9  81.1  91.4   Hematocrit 36.0 - 46.0 % 34.5  34.3  35.7   Platelets 150 - 400 K/uL 242  218  257         Latest Ref Rng & Units 03/27/2023    8:19 AM 03/13/2023   10:29 AM 02/27/2023    8:29 AM  CMP  Glucose 70 - 99 mg/dL 782  956  213   BUN 8 - 23 mg/dL 14  17  23    Creatinine 0.44 - 1.00 mg/dL 0.86  5.78  4.69   Sodium 135 - 145 mmol/L 143  143  142   Potassium 3.5 - 5.1 mmol/L 3.5  3.9  4.0   Chloride 98 - 111 mmol/L 111  111  112   CO2 22 - 32 mmol/L 25  26  23    Calcium 8.9 - 10.3 mg/dL 9.0  9.3  9.1   Total Protein 6.5 - 8.1 g/dL 6.3  6.2  6.4   Total  Bilirubin <1.2 mg/dL 0.7  0.7  0.7   Alkaline Phos 38 - 126 U/L 138  138  131   AST 15 - 41 U/L 22  27  21    ALT 0 - 44 U/L 18  19  15        RADIOGRAPHIC STUDIES: I have personally reviewed the radiological images as listed and agreed with the findings in the report. No results found.    Orders Placed This Encounter  Procedures   CBC with Differential (Cancer Center Only)    Standing Status:   Future    Standing Expiration Date:   05/28/2024   CMP (Cancer Center only)    Standing Status:   Future    Standing Expiration Date:   05/28/2024   CBC with Differential (Cancer Center Only)    Standing Status:   Future    Standing Expiration Date:   06/11/2024   CMP (Cancer Center only)    Standing Status:   Future    Standing Expiration Date:   06/11/2024   All questions were answered. The Castro knows to call the clinic with any problems, questions or concerns. No barriers to learning was detected. The total time spent in the appointment was 30 minutes.     [name], MD 03/27/2023

## 2023-03-27 NOTE — Patient Instructions (Signed)
[facility] - A DEPT OF [facility]   Discharge Instructions: Thank you for choosing [facility] to provide your oncology and hematology care.   If you have a lab appointment with the Cancer Center, please go directly to the Cancer Center and check in at the registration area.   Wear comfortable clothing and clothing appropriate for easy access to any Portacath or PICC line.   We strive to give you quality time with your provider. You may need to reschedule your appointment if you arrive late (15 or more minutes).  Arriving late affects you and other patients whose appointments are after yours.  Also, if you miss three or more appointments without notifying the office, you may be dismissed from the clinic at the provider's discretion.      For prescription refill requests, have your pharmacy contact our office and allow 72 hours for refills to be completed.    Today you received the following chemotherapy and/or immunotherapy agents: Bevacizumab (Mvasi), Irinotecan, and Fluorouracil (Adrucil)      To help prevent nausea and vomiting after your treatment, we encourage you to take your nausea medication as directed.  BELOW ARE SYMPTOMS THAT SHOULD BE REPORTED IMMEDIATELY: *FEVER GREATER THAN 100.4 F (38 C) OR HIGHER *CHILLS OR SWEATING *NAUSEA AND VOMITING THAT IS NOT CONTROLLED WITH YOUR NAUSEA MEDICATION *UNUSUAL SHORTNESS OF BREATH *UNUSUAL BRUISING OR BLEEDING *URINARY PROBLEMS (pain or burning when urinating, or frequent urination) *BOWEL PROBLEMS (unusual diarrhea, constipation, pain near the anus) TENDERNESS IN MOUTH AND THROAT WITH OR WITHOUT PRESENCE OF ULCERS (sore throat, sores in mouth, or a toothache) UNUSUAL RASH, SWELLING OR PAIN  UNUSUAL VAGINAL DISCHARGE OR ITCHING   Items with * indicate a potential emergency and should be followed up as soon as possible or go to the Emergency Department if any problems should  occur.  Please show the CHEMOTHERAPY ALERT CARD or IMMUNOTHERAPY ALERT CARD at check-in to the Emergency Department and triage nurse.  Should you have questions after your visit or need to cancel or reschedule your appointment, please contact [facility] - A DEPT OF [facility]  Dept: [phone]  and follow the prompts.  Office hours are 8:00 a.m. to 4:30 p.m. Monday - Friday. Please note that voicemails left after 4:00 p.m. may not be returned until the following business day.  We are closed weekends and major holidays. You have access to a nurse at all times for urgent questions. Please call the main number to the clinic Dept: [phone] and follow the prompts.   For any non-urgent questions, you may also contact your provider using MyChart. We now offer e-Visits for anyone 66 and older to request care online for non-urgent symptoms. For details visit [email or web address].   Also download the MyChart app! Go to the app store, search "MyChart", open the app, select [facility], and log in with your MyChart username and password.

## 2023-03-29 ENCOUNTER — Inpatient Hospital Stay: Payer: Medicare Other

## 2023-03-29 VITALS — BP 138/54 | HR 61 | Temp 98.6°F | Resp 17

## 2023-03-29 DIAGNOSIS — Z5111 Encounter for antineoplastic chemotherapy: Secondary | ICD-10-CM | POA: Diagnosis not present

## 2023-03-29 DIAGNOSIS — C7963 Secondary malignant neoplasm of bilateral ovaries: Secondary | ICD-10-CM | POA: Diagnosis not present

## 2023-03-29 DIAGNOSIS — Z5189 Encounter for other specified aftercare: Secondary | ICD-10-CM | POA: Diagnosis not present

## 2023-03-29 DIAGNOSIS — C182 Malignant neoplasm of ascending colon: Secondary | ICD-10-CM

## 2023-03-29 DIAGNOSIS — C786 Secondary malignant neoplasm of retroperitoneum and peritoneum: Secondary | ICD-10-CM | POA: Diagnosis not present

## 2023-03-29 DIAGNOSIS — Z95828 Presence of other vascular implants and grafts: Secondary | ICD-10-CM

## 2023-03-29 DIAGNOSIS — I10 Essential (primary) hypertension: Secondary | ICD-10-CM | POA: Diagnosis not present

## 2023-03-29 DIAGNOSIS — C787 Secondary malignant neoplasm of liver and intrahepatic bile duct: Secondary | ICD-10-CM | POA: Diagnosis not present

## 2023-03-29 MED ORDER — SODIUM CHLORIDE 0.9% FLUSH
10.0000 mL | INTRAVENOUS | Status: DC | PRN
  Administered 2023-03-29: 10 mL

## 2023-03-29 MED ORDER — HEPARIN SOD (PORK) LOCK FLUSH 100 UNIT/ML IV SOLN
500.0000 [IU] | Freq: Once | INTRAVENOUS | Status: AC | PRN
  Administered 2023-03-29: 500 [IU]

## 2023-03-29 MED ORDER — PEGFILGRASTIM INJECTION 6 MG/0.6ML ~~LOC~~
6.0000 mg | PREFILLED_SYRINGE | Freq: Once | SUBCUTANEOUS | Status: AC
  Administered 2023-03-29: 6 mg via SUBCUTANEOUS
  Filled 2023-03-29: qty 0.6

## 2023-04-09 NOTE — Assessment & Plan Note (Signed)
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 Path showed overall Stage IIIB cancer.  -s/p 6 months adjuvant FOLFOX, completed 06/01/20, oxaliplatin held for final 2 cycles due to neuropathy.  -s/p BSO and hysterectomy on 12/22/21 with Dr. Pricilla Holm. Path revealed metastatic moderately differentiated colonic adenocarcinoma involving both ovaries and peritoneum -she restarted FOLFOX on 01/24/22. -Due to disease progression, chemotherapy changed to second line FOLFIRI and bevacizumab on April 06, 2022. She has been tolerating moderately well, better now with dose reduction, will continue -Restaging CT scan from 09/27/2022 showed continous response in liver and peritoneal metastasis, no other new lesions.   -We discussed maintenance therapy option in future  -due to her PE in 10/2022, will hold beva for 3 months  -restaging CT 12/31/2022 showed stable disease and live met is not well defined on CT. She restarted beva on 01/17/23  -restaging CT 03/23/2023 showed overall stable disease (stable small lung nodules, no other evidence of cancer)

## 2023-04-10 ENCOUNTER — Encounter: Payer: Self-pay | Admitting: Hematology

## 2023-04-10 ENCOUNTER — Inpatient Hospital Stay: Payer: Medicare Other | Attending: Hematology

## 2023-04-10 ENCOUNTER — Inpatient Hospital Stay: Payer: Medicare Other

## 2023-04-10 ENCOUNTER — Inpatient Hospital Stay: Payer: Medicare Other | Admitting: Hematology

## 2023-04-10 VITALS — BP 156/69 | HR 66 | Temp 98.4°F | Wt 214.0 lb

## 2023-04-10 DIAGNOSIS — G620 Drug-induced polyneuropathy: Secondary | ICD-10-CM | POA: Insufficient documentation

## 2023-04-10 DIAGNOSIS — R918 Other nonspecific abnormal finding of lung field: Secondary | ICD-10-CM | POA: Insufficient documentation

## 2023-04-10 DIAGNOSIS — C787 Secondary malignant neoplasm of liver and intrahepatic bile duct: Secondary | ICD-10-CM | POA: Diagnosis not present

## 2023-04-10 DIAGNOSIS — Z5189 Encounter for other specified aftercare: Secondary | ICD-10-CM | POA: Diagnosis not present

## 2023-04-10 DIAGNOSIS — C182 Malignant neoplasm of ascending colon: Secondary | ICD-10-CM

## 2023-04-10 DIAGNOSIS — Z86711 Personal history of pulmonary embolism: Secondary | ICD-10-CM | POA: Insufficient documentation

## 2023-04-10 DIAGNOSIS — Z7901 Long term (current) use of anticoagulants: Secondary | ICD-10-CM | POA: Diagnosis not present

## 2023-04-10 DIAGNOSIS — R197 Diarrhea, unspecified: Secondary | ICD-10-CM | POA: Insufficient documentation

## 2023-04-10 DIAGNOSIS — C786 Secondary malignant neoplasm of retroperitoneum and peritoneum: Secondary | ICD-10-CM | POA: Diagnosis not present

## 2023-04-10 DIAGNOSIS — R110 Nausea: Secondary | ICD-10-CM | POA: Insufficient documentation

## 2023-04-10 DIAGNOSIS — Z923 Personal history of irradiation: Secondary | ICD-10-CM | POA: Insufficient documentation

## 2023-04-10 DIAGNOSIS — Z5111 Encounter for antineoplastic chemotherapy: Secondary | ICD-10-CM | POA: Insufficient documentation

## 2023-04-10 DIAGNOSIS — T451X5A Adverse effect of antineoplastic and immunosuppressive drugs, initial encounter: Secondary | ICD-10-CM | POA: Insufficient documentation

## 2023-04-10 DIAGNOSIS — C7963 Secondary malignant neoplasm of bilateral ovaries: Secondary | ICD-10-CM | POA: Diagnosis not present

## 2023-04-10 LAB — CBC WITH DIFFERENTIAL (CANCER CENTER ONLY)
Abs Immature Granulocytes: 0.63 10*3/uL — ABNORMAL HIGH (ref 0.00–0.07)
Basophils Absolute: 0.1 10*3/uL (ref 0.0–0.1)
Basophils Relative: 1 %
Eosinophils Absolute: 0.1 10*3/uL (ref 0.0–0.5)
Eosinophils Relative: 1 %
HCT: 35.2 % — ABNORMAL LOW (ref 36.0–46.0)
Hemoglobin: 10.9 g/dL — ABNORMAL LOW (ref 12.0–15.0)
Immature Granulocytes: 6 %
Lymphocytes Relative: 29 %
Lymphs Abs: 3 10*3/uL (ref 0.7–4.0)
MCH: 31.7 pg (ref 26.0–34.0)
MCHC: 31 g/dL (ref 30.0–36.0)
MCV: 102.3 fL — ABNORMAL HIGH (ref 80.0–100.0)
Monocytes Absolute: 0.8 10*3/uL (ref 0.1–1.0)
Monocytes Relative: 8 %
Neutro Abs: 5.7 10*3/uL (ref 1.7–7.7)
Neutrophils Relative %: 55 %
Platelet Count: 217 10*3/uL (ref 150–400)
RBC: 3.44 MIL/uL — ABNORMAL LOW (ref 3.87–5.11)
RDW: 16.7 % — ABNORMAL HIGH (ref 11.5–15.5)
WBC Count: 10.4 10*3/uL (ref 4.0–10.5)
nRBC: 0.2 % (ref 0.0–0.2)

## 2023-04-10 LAB — CMP (CANCER CENTER ONLY)
ALT: 15 U/L (ref 0–44)
AST: 20 U/L (ref 15–41)
Albumin: 3.7 g/dL (ref 3.5–5.0)
Alkaline Phosphatase: 110 U/L (ref 38–126)
Anion gap: 8 (ref 5–15)
BUN: 15 mg/dL (ref 8–23)
CO2: 26 mmol/L (ref 22–32)
Calcium: 9.2 mg/dL (ref 8.9–10.3)
Chloride: 109 mmol/L (ref 98–111)
Creatinine: 0.84 mg/dL (ref 0.44–1.00)
GFR, Estimated: >60 mL/min (ref >60)
Glucose, Bld: 110 mg/dL — ABNORMAL HIGH (ref 70–99)
Potassium: 3.6 mmol/L (ref 3.5–5.1)
Sodium: 143 mmol/L (ref 135–145)
Total Bilirubin: 0.7 mg/dL (ref <1.2)
Total Protein: 6.2 g/dL — ABNORMAL LOW (ref 6.5–8.1)

## 2023-04-10 LAB — TOTAL PROTEIN, URINE DIPSTICK: Protein, ur: NEGATIVE mg/dL

## 2023-04-10 LAB — CEA (ACCESS): CEA (CHCC): 7.71 ng/mL — ABNORMAL HIGH (ref 0.00–5.00)

## 2023-04-10 MED ORDER — SODIUM CHLORIDE 0.9 % IV SOLN
400.0000 mg/m2 | Freq: Once | INTRAVENOUS | Status: AC
  Administered 2023-04-10: 896 mg via INTRAVENOUS
  Filled 2023-04-10: qty 44.8

## 2023-04-10 MED ORDER — ATROPINE SULFATE 1 MG/ML IV SOLN
0.5000 mg | Freq: Once | INTRAVENOUS | Status: AC | PRN
  Administered 2023-04-10: 0.5 mg via INTRAVENOUS
  Filled 2023-04-10: qty 1

## 2023-04-10 MED ORDER — HEPARIN SOD (PORK) LOCK FLUSH 100 UNIT/ML IV SOLN
500.0000 [IU] | Freq: Once | INTRAVENOUS | Status: DC | PRN
Start: 2023-04-10 — End: 2023-04-10

## 2023-04-10 MED ORDER — SODIUM CHLORIDE 0.9 % IV SOLN
100.0000 mg/m2 | Freq: Once | INTRAVENOUS | Status: AC
  Administered 2023-04-10: 220 mg via INTRAVENOUS
  Filled 2023-04-10: qty 11

## 2023-04-10 MED ORDER — SODIUM CHLORIDE 0.9 % IV SOLN
5.0000 mg/kg | Freq: Once | INTRAVENOUS | Status: AC
  Administered 2023-04-10: 500 mg via INTRAVENOUS
  Filled 2023-04-10: qty 4

## 2023-04-10 MED ORDER — SODIUM CHLORIDE 0.9% FLUSH
10.0000 mL | INTRAVENOUS | Status: DC | PRN
  Administered 2023-04-10: 10 mL

## 2023-04-10 MED ORDER — DEXAMETHASONE SODIUM PHOSPHATE 10 MG/ML IJ SOLN
10.0000 mg | Freq: Once | INTRAMUSCULAR | Status: AC
Start: 2023-04-10 — End: 2023-04-10
  Administered 2023-04-10: 10 mg via INTRAVENOUS
  Filled 2023-04-10: qty 1

## 2023-04-10 MED ORDER — PALONOSETRON HCL INJECTION 0.25 MG/5ML
0.2500 mg | Freq: Once | INTRAVENOUS | Status: AC
  Administered 2023-04-10: 0.25 mg via INTRAVENOUS
  Filled 2023-04-10: qty 5

## 2023-04-10 MED ORDER — SODIUM CHLORIDE 0.9 % IV SOLN
2400.0000 mg/m2 | INTRAVENOUS | Status: DC
  Administered 2023-04-10: 5000 mg via INTRAVENOUS
  Filled 2023-04-10: qty 100

## 2023-04-10 MED ORDER — SODIUM CHLORIDE 0.9 % IV SOLN: INTRAVENOUS | Status: DC

## 2023-04-10 NOTE — Progress Notes (Signed)
[facility]   [phone] Fax:([phone]   Clinic Follow up Note   Patient Care Team: [name], MD as PCP - General (Family Medicine) [name], MD as Consulting Physician (General Surgery) [name], MD as Consulting Physician (Hematology) [name], MD as Consulting Physician (General Surgery) [name], MD as Consulting Physician (Hematology and Oncology)  Date of Service:  04/10/2023  CHIEF COMPLAINT: f/u of metastatic colon cancer  CURRENT THERAPY:  Second line chemotherapy FOLFIRI and bevacizumab  Oncology History   Malignant neoplasm of ascending colon (HCC) pT3N2aM0 stage IIB, MSS, metastatic to b/l ovaries and peritoneum and liver, KRAS mutation G12V (+)  -Initially diagnosed in 08/2019, s/p resection with Dr Maisie Fus on 11/01/19. Path showed overall Stage IIIB cancer.  -s/p 6 months adjuvant FOLFOX, completed 06/01/20, oxaliplatin held for final 2 cycles due to neuropathy.  -s/p BSO and hysterectomy on 12/22/21 with Dr. Pricilla Holm. Path revealed metastatic moderately differentiated colonic adenocarcinoma involving both ovaries and peritoneum -she restarted FOLFOX on 01/24/22. -Due to disease progression, chemotherapy changed to second line FOLFIRI and bevacizumab on April 06, 2022. She has been tolerating moderately well, better now with dose reduction, will continue -Restaging CT scan from 09/27/2022 showed continous response in liver and peritoneal metastasis, no other new lesions.   -We discussed maintenance therapy option in future  -due to her PE in 10/2022, will hold beva for 3 months  -restaging CT 12/31/2022 showed stable disease and live met is not well defined on CT. She restarted beva on 01/17/23  -restaging CT 03/23/2023 showed overall stable disease (stable small lung nodules, no other evidence of cancer)   Assessment and Plan    Metastatic Colon Cancer Follow-up for metastatic colon cancer. Symptoms include  intermittent nausea, diarrhea, and neuropathy in the fingertips. Blood counts are normal. Recent CT scans show no significant findings in the liver or peritoneal nodules; small lung nodules are not confirmed to be related to cancer. Tolerating current treatment well. Discussed potential switch to maintenance therapy options including erythritol alone or Xeloda. Risks include potential cancer progression with less chemotherapy. Benefits include reduced side effects. Outcome statistics: CT scans show no significant findings, indicating stable disease. Medical decision making: Considering maintenance therapy based on current stable disease and patient's tolerance. Patient prefers to continue current regimen for now. - Continue current chemotherapy regimen - Schedule next chemotherapy cycle on 05/01/2023 - Consider PET scan in 2-3 months if insurance approves - Discuss potential switch to maintenance therapy (e.g., erythritol alone or Xeloda)  Chemotherapy-Induced Neuropathy Neuropathy in the fingertips, described as cold and burning sensations. Symptoms are manageable but persistent. - Provide supportive care as needed  Chemotherapy-Induced Nausea and Diarrhea Intermittent nausea and diarrhea, manageable with current medications (Imodium). Symptoms are not severe and occur intermittently. - Continue current management with Imodium as needed  Pulmonary embolism Blood clots related to cancer. Currently on Eliquis for anticoagulation. - Continue Eliquis indefinitely   Plan -Lab reviewed, adequate for treatment, will proceed chemo at the same dose today -Lab and follow-up in 2 weeks before next cycle chemo     SUMMARY OF ONCOLOGIC HISTORY: Oncology History Overview Note   Cancer Staging  Malignant neoplasm of [place] breast [facility]) Staging form: Breast, AJCC 7th Edition - Clinical stage from 06/11/2014: Stage 0 (Tis (DCIS), N0, M0) - Unsigned Staged by: Pathologist and managing  physician Laterality: Left Estrogen receptor status: Positive Progesterone receptor status: Positive Stage used in treatment planning: Yes National guidelines used in treatment planning:  Yes Type of national guideline used in treatment planning: NCCN - Pathologic stage from 07/03/2014: Stage Unknown (Tis (DCIS), NX, cM0) - Signed by [name], MD on 07/10/2014 Staged by: Pathologist Laterality: Left Estrogen receptor status: Positive Progesterone receptor status: Positive Stage used in treatment planning: Yes National guidelines used in treatment planning: Yes Type of national guideline used in treatment planning: NCCN Staging comments: Staged on final lumpectomy specimen by Dr. [name].  Right colon cancer Staging form: Colon and Rectum, AJCC 8th Edition - Pathologic stage from 11/01/2019: Stage IIIB (pT3, pN2a, cM0) - Signed by [name], MD on 11/29/2019 Stage prefix: Initial diagnosis Histologic grading system: 4 grade system Histologic grade (G): G2 Residual tumor (R): R0 - None Tumor deposits (TD): Absent Perineural invasion (PNI): Absent Microsatellite instability (MSI): Stable KRAS mutation: Unknown NRAS mutation: Unknown BRAF mutation: Unknown     Malignant neoplasm of [place] breast (HCC)  05/29/2014 Initial Biopsy   Left breast needle core biopsy: Grade 2, DCIS with calcs. ER+ (100%), PR+ (96%).    06/04/2014 Initial Diagnosis   Left breast DCIS with calcifications, ER 100%, PR 96%   06/10/2014 Breast MRI   Left breast: 2.4 x 1.3 x 1.1 cm area of patchy non-mass enhancement upper outer quadrant includes postbiopsy seroma; Right breast: 1.2 cm previously biopsied stable benign fibroadenoma   06/12/2014 Procedure   Genetic counseling/testing: Identified 1 VUS on CHEK2 gene. Remainder of 17 gene panel tested negative and included: ATM, BARD1, BRCA 1/2, BRIP1, CDH1, CHEK2, EPCAM, MLH1, MSH2, MSH6, NBN, NF1, PALB2, PTEN, RAD50, RAD51C, RAD51D, STK11, and TP53.    07/01/2014  Surgery   Left breast lumpectomy ([facility]): Grade 1, DCIS, spanning 2.3 cm, 1 mm margin, ER 100%, PR 96%   07/31/2014 - 08/28/2014 Radiation Therapy   Adjuvant RT completed [name]). Left breast: Total dose 42.5 Gy over 17 fractions. Left breast boost: Total dose 7.5 Gy over 3 fractions.    09/14/2014 - 01/13/2020 Anti-estrogen oral therapy   Anastrazole 1mg  daily. Planned duration of treatment: 5 years [place]). Completed in 01/2020.    09/25/2014 Survivorship   Survivorship Care Plan given to patient and reviewed with her in person.    03/02/2021 Imaging   CT CAP  IMPRESSION: 1. No findings of active/recurrent malignancy. Partial right hemicolectomy. 2. Endometrial stripe remains mildly thickened, but endometrial biopsy in May was negative for malignancy. 3. Progressive endplate sclerosis and endplate irregularity at T2-3, probably due to degenerative endplate findings. If the has referable upper thoracic pain/symptoms then thoracic spine MRI could be used for further workup. 4. Other imaging findings of potential clinical significance: Mild cardiomegaly. Aortic Atherosclerosis (ICD10-I70.0). Mild mitral valve calcification. Postoperative findings in the left breast with adjacent radiation port anteriorly in the left upper lobe. Tiny pulmonary nodules in the left lower lobe are unchanged from earliest available comparison of 10/17/2019 and probably benign although may merit surveillance. Multilevel lumbar impingement. Mild pelvic floor laxity.   Malignant neoplasm of ascending colon (HCC)  09/19/2019 Imaging   CT AP W contrast 09/19/19  IMPRESSION Fullness in the cecum, cannot exclude a mass. No evidence for metastatic disease is identified.    09/24/2019 Procedure   Colonoscopy by Dr [name] 09/24/19 IMPRESSION 1. The colon was redundant  2. Mild diverticulosis was noted through the entire examined colon 3. Single 12mm polyp was found in the ascending colon; polypectomy was  performed using snare cautery and biopsy forceps 4. Mild diverticulosis was notes in the descending colon and sigmoid colon.  5. Single  polyp was found in the sigmoid colon, polypectomy was performed with cold forceps.  6. Single polyp was found in the rectosigmoid colon; polypectomy was performed with cold snare  7. Small internal hemorrhoids  8. Large mass was found at the cecum; multiple biopsies of the area were performed using cold forceps; injection (tattooing) was performed distal to the mass.    09/24/2019 Initial Biopsy   INTERPRETATION AND DIAGNOSIS:  A. Cecum, biopsy:  Invasive moderately differentiated adenocarcinoma.  see comment  B. Polyp @ ascending colon, polypectomy:  Tubular Adenoma  C. Polyp @ sigmoid colon Polypectomy:  hyperplastic polyp.  D. Polyp @ rectosigmoid colon, Polypectomy:  Hyperplastic Polyp      10/16/2019 Imaging   CT Chest IMPRESSION: 1. Multiple small pulmonary nodules measuring 5 mm or less in size in the lungs. These are nonspecific and are typically considered statistically likely benign. However, given the patient's history of primary malignancy, close attention on follow-up studies is recommended to ensure stability. 2. Aortic atherosclerosis, in addition to right coronary artery disease. Assessment for potential risk factor modification, dietary therapy or pharmacologic therapy may be warranted, if clinically indicated. 3. There are calcifications of the aortic valve and mitral annulus. Echocardiographic correlation for evaluation of potential valvular dysfunction may be warranted if clinically indicated. 4. Small hiatal hernia.   Aortic Atherosclerosis (ICD10-I70.0).   11/01/2019 Initial Diagnosis   Colon cancer (HCC)   11/01/2019 Surgery   LAPAROSCOPIC PARTIAL COLECTOMY by Dr Maisie Fus and Dr Michaell Cowing   11/01/2019 Pathology Results   FINAL MICROSCOPIC DIAGNOSIS:   A. COLON, PROXIMAL RIGHT, COLECTOMY:  - Invasive colonic adenocarcinoma, 5  cm.  - Tumor invades through the muscularis propria into pericolonic tissues.   - Margins of resection are not involved.  - Metastatic carcinoma in (5) of (13) lymph nodes.  - See oncology table.    MSI Stable  Mismatch repair normal  MLH1 - Preserved nuclear expression (greater 50% tumor expression) MSH2 - Preserved nuclear expression (greater 50% tumor expression) MSH6 - Preserved nuclear expression (greater 50% tumor expression) PMS2 - Preserved nuclear expression (greater 50% tumor expression)   11/01/2019 Cancer Staging   Staging form: Colon and Rectum, AJCC 8th Edition - Pathologic stage from 11/01/2019: Stage IIIB (pT3, pN2a, cM0) - Signed by [name], MD on 11/29/2019   12/10/2019 Procedure   PAC placed 12/10/19   12/17/2019 - 06/01/2020 Chemotherapy   FOLFOX q2weeks starting in 2 weeks starting 12/17/19. Held 01/27/20-02/10/20 due to b/l PE. Oxaliplatin held C11-12 due to neuropathy. Completed on 06/01/20   03/02/2021 Imaging   CT CAP  IMPRESSION: 1. No findings of active/recurrent malignancy. Partial right hemicolectomy. 2. Endometrial stripe remains mildly thickened, but endometrial biopsy in May was negative for malignancy. 3. Progressive endplate sclerosis and endplate irregularity at T2-3, probably due to degenerative endplate findings. If the has referable upper thoracic pain/symptoms then thoracic spine MRI could be used for further workup. 4. Other imaging findings of potential clinical significance: Mild cardiomegaly. Aortic Atherosclerosis (ICD10-I70.0). Mild mitral valve calcification. Postoperative findings in the left breast with adjacent radiation port anteriorly in the left upper lobe. Tiny pulmonary nodules in the left lower lobe are unchanged from earliest available comparison of 10/17/2019 and probably benign although may merit surveillance. Multilevel lumbar impingement. Mild pelvic floor laxity.   08/26/2021 Imaging   EXAM: CT CHEST, ABDOMEN, AND PELVIS  WITH CONTRAST  IMPRESSION: 1. Stable examination without new or progressive findings to suggest local recurrence or metastatic disease within the chest, abdomen, or  pelvis. 2. Hepatomegaly with hepatic steatosis. 3. Sigmoid colonic diverticulosis without findings of acute diverticulitis. 4. Similar prominent endplate sclerosis and irregularity at T2-T3 is most consistent with Modic type endplate degenerative changes. However, if patient has referable upper thoracic pain consider further workup with thoracic spine MRI. 5. Similar thickening of the endometrial stripe measuring up to 8 mm, which was previously biopsied with results negative for malignancy. 6. Aortic Atherosclerosis (ICD10-I70.0).   11/10/2021 PET scan   IMPRESSION: 1. LEFT ovary is increased in size and is intensely hypermetabolic. While physiologic hypermetabolic ovarian tissue is not uncommon, the enlargement and asymmetric activity warrants further evaluation. Consider contrast pelvic MRI vs tissue sampling. 2. No evidence of metastatic colorectal carcinoma otherwise. 3. Post RIGHT hemicolectomy anatomy. 4. Evidence of radiation change in the LEFT upper lobe (remote breast cancer).     12/22/2021 Relapse/Recurrence    FINAL MICROSCOPIC DIAGNOSIS:   A. LEFT OVARY AND FALLOPIAN TUBE, SALPINGO OOPHORECTOMY:  - Metastatic moderately differentiated colonic adenocarcinoma involving left ovary  - Focal ovarian stromal calcification  - Segment of benign fallopian tube   B. UTERUS WITH RIGHT FALLOPIAN TUBE AND OVARY, HYSTERECTOMY AND SALPINGO-OOPHORECTOMY:  - Metastatic moderately differentiated colonic adenocarcinoma involving right ovary  - Focal invasive extensive adenomyosis  - Benign endometrial polyps  - Benign proliferative phase endometrium  - Hydrosalpinx of right fallopian tube  - Focal ovarian stromal calcification   C. PERITONEAL DEPOSITS, ANTERIOR CUL DE SAC, BIOPSY:  - Metastatic mucinous adenocarcinoma,  consistent with colorectal primary    COMMENT:  Immunohistochemical stains show that the tumor cells are positive for CK20 and CDX2 while they are negative for CK7 and PAX8, consistent with above interpretation.    01/24/2022 - 03/23/2022 Chemotherapy   Patient is on Treatment Plan : COLORECTAL FOLFOX q14d x 3 months     04/01/2022 Imaging   CT AP IMPRESSION: 1. New omental metastatic disease. 2. Vague hypoattenuating lesion in the inferior right hepatic lobe is new and also worrisome for metastatic disease. 3. Similar small right lower lobe nodules. Recommend continued attention on follow-up. 4. Steatotic enlarged liver. 5.  Aortic atherosclerosis (ICD10-I70.0).   04/06/2022 -  Chemotherapy   Patient is on Treatment Plan : COLORECTAL FOLFIRI + Bevacizumab q14d     04/21/2022 Imaging    IMPRESSION: 1. Stable chest CT. No evidence of metastatic disease. 2. Stable small pulmonary nodules, considered benign based on stability. 3. Stable postsurgical changes in the left breast and anterior left upper lobe radiation changes. 4. Aortic Atherosclerosis (ICD10-I70.0) and Emphysema (ICD10-J43.9).   04/21/2022 Imaging    IMPRESSION: 1. Small enhancing lesion inferiorly in the right hepatic lobe is typical of metastatic disease and stable from recent abdominal CT. No other definite liver lesions are identified. Mild hepatic steatosis. 2. No other significant abdominal findings. 3. Omental nodularity seen on CT is not well visualized by MRI.    Imaging     06/23/2022 Imaging    IMPRESSION: 1. Hypodense lesion of the inferior right lobe of the liver, hepatic segment VI is diminished in size, consistent with treatment response. 2. Tiny peritoneal and omental nodules identified by prior examination are diminished in size, consistent with treatment response. 3. Multiple tiny pulmonary nodules unchanged, most likely benign and incidental, however continued attention on follow-up  warranted in the setting of known metastatic disease. 4. No evidence of new metastatic disease in the chest, abdomen, or pelvis. 5. Status post partial right hemicolectomy and reanastomosis. 6. Diffuse mosaic attenuation of the airspaces, consistent  with small airways disease. 7. Hepatomegaly.      Discussed the use of AI scribe software for clinical note transcription with the patient, who gave verbal consent to proceed.  History of Present Illness   The patient, a 77 year old [place] with metastatic colon cancer, presents for a routine follow-up. She reports feeling tired, which she attributes to dealing with her granddaughter's emotional crisis and attending her grandchildren's sports events. She denies any new problems since her last treatment cycle. However, she notes that her neuropathy might be worsening, describing a cold, burning sensation in the tips of her fingers. She manages this by warming her hands. She also experiences intermittent feelings of unwellness and controllable diarrhea after therapy, which she manages with ibuprofen or Imodium. She denies any nausea. She continues to be active, driving her grandchildren to and from school and attending their sports events.         All other systems were reviewed with the patient and are negative.  MEDICAL HISTORY:  Past Medical History:  Diagnosis Date   Aortic atherosclerosis (HCC)    Arthritis    feet, lower back   Basal cell carcinoma    arm   Breast cancer of upper-outer quadrant of left [place] breast (HCC) 06/04/2014   Cataract    immature on the left   Colon cancer (HCC) 08/2019   Diabetes mellitus without complication (HCC)    Diverticulosis    Dizziness    > 66yrs ago;took Antivert    Family history of anesthesia complication    sister slow to wake up with anesthesia   Family history of breast cancer    Family history of colon cancer    Family history of uterine cancer    GERD (gastroesophageal reflux  disease)    takes occasional TUMs   History of bronchitis    > 84yrs ago   History of colon polyps    History of hiatal hernia    Small noted on CT   History of pulmonary embolus (PE)    Hypertension    takes Losartan daily and HCTZ   Iron deficiency anemia    Joint pain    Numbness    to toes on each foot   Peripheral neuropathy    feet and toes   Personal history of radiation therapy    Pulmonary nodules    Noted on CT   Radiation 07/31/14-08/28/14   Left Breast 20 fxs   Seasonal allergies    takes Claritin prn   Urinary frequency    Vitamin D deficiency    takes VIt D daily    SURGICAL HISTORY: Past Surgical History:  Procedure Laterality Date   BREAST BIOPSY Bilateral    BREAST LUMPECTOMY Left    BREAST LUMPECTOMY WITH RADIOACTIVE SEED LOCALIZATION Left 07/01/2014   Procedure: LEFT BREAST LUMPECTOMY WITH RADIOACTIVE SEED LOCALIZATION;  Surgeon: Glenna Fellows, MD;  Location: [facility];  Service: General;  Laterality: Left;   CATARACT EXTRACTION Right    COLONOSCOPY  09/24/2019   [place]   LAPAROSCOPIC PARTIAL COLECTOMY N/A 11/01/2019   Procedure: LAPAROSCOPIC PARTIAL COLECTOMY;  Surgeon: [name], MD;  Location: [facility];  Service: General;  Laterality: N/A;   POLYPECTOMY     PORTACATH PLACEMENT N/A 12/10/2019   Procedure: INSERTION PORT-A-CATH ULTRASOUND GUIDED IN RIGHT IJ;  Surgeon: [name], MD;  Location: [facility];  Service: General;  Laterality: N/A;   ROBOTIC ASSISTED TOTAL HYSTERECTOMY Bilateral 12/22/2021   Procedure: XI ROBOTIC ASSISTED TOTAL HYSTERECTOMY  WITH BILATERAL SALPINGO OOPHORECTOMY;  Surgeon: Carver Fila, MD;  Location: [facility];  Service: Gynecology;  Laterality: Bilateral;   TOTAL KNEE ARTHROPLASTY Left 10/24/2012   Procedure: TOTAL KNEE ARTHROPLASTY;  Surgeon: Nestor Lewandowsky, MD;  Location: [facility];  Service: Orthopedics;  Laterality: Left;   TOTAL KNEE ARTHROPLASTY Right 01/09/2013   Procedure: TOTAL KNEE  ARTHROPLASTY;  Surgeon: Nestor Lewandowsky, MD;  Location: [facility];  Service: Orthopedics;  Laterality: Right;   TUBAL LIGATION      I have reviewed the social history and family history with the patient and they are unchanged from previous note.  ALLERGIES:  is allergic to oxycodone.  MEDICATIONS:  Current Outpatient Medications  Medication Sig Dispense Refill   apixaban (ELIQUIS) 5 MG TABS tablet Take 1 tablet (5 mg total) by mouth 2 (two) times daily. 60 tablet 5   b complex vitamins capsule Take 1 capsule by mouth daily.     Cholecalciferol (VITAMIN D) 2000 UNITS CAPS Take 2,000 Units by mouth daily.      ibuprofen (ADVIL) 200 MG tablet Take 200 mg by mouth daily as needed (pain).     lidocaine-prilocaine (EMLA) cream Apply 1 Application topically as needed. 30 g 0   loratadine (CLARITIN) 10 MG tablet Take 10 mg by mouth daily as needed for allergies.     losartan (COZAAR) 25 MG tablet Take 1 tablet (25 mg total) by mouth daily. 90 tablet 3   metFORMIN (GLUCOPHAGE) 500 MG tablet Take 1 tablet (500 mg total) by mouth in the morning and at bedtime. 180 tablet 3   Multiple Vitamins-Minerals (MULTIVITAMIN WITH MINERALS) tablet Take 1 tablet by mouth daily.     omeprazole (PRILOSEC) 20 MG capsule Take 20 mg by mouth daily before breakfast.      ondansetron (ZOFRAN) 8 MG tablet Take 1 tablet (8 mg total) by mouth every 8 (eight) hours as needed for nausea or vomiting. Start on the third day after chemotherapy. 30 tablet 1   potassium chloride (KLOR-CON) 10 MEQ tablet Take 1 tablet (10 mEq total) by mouth 2 (two) times daily. 60 tablet 1   pravastatin (PRAVACHOL) 10 MG tablet Take 1 tablet (10 mg total) by mouth daily. 90 tablet 3   pregabalin (LYRICA) 25 MG capsule Take 1 capsule (25 mg total) by mouth every morning. Take in addition to 50 mg at bedtime 60 capsule 2   pregabalin (LYRICA) 50 MG capsule Take 1 capsule (50 mg total) by mouth at bedtime. Take in addition to 25 mg in the morning 60  capsule 2   prochlorperazine (COMPAZINE) 10 MG tablet Take 1 tablet (10 mg total) by mouth every 6 (six) hours as needed for nausea or vomiting. 30 tablet 1   triamcinolone ointment (KENALOG) 0.5 % Apply 1 Application topically 2 (two) times daily. 30 g 0   No current facility-administered medications for this visit.    PHYSICAL EXAMINATION: ECOG PERFORMANCE STATUS: 1 - Symptomatic but completely ambulatory  Vitals:   04/10/23 0812  BP: (!) 156/69  Pulse: 66  Temp: 98.4 F (36.9 C)  SpO2: 98%   Wt Readings from Last 3 Encounters:  04/10/23 214 lb (97.1 kg)  03/27/23 212 lb 6 oz (96.3 kg)  03/13/23 215 lb 6.4 oz (97.7 kg)     GENERAL:alert, no distress and comfortable SKIN: skin color, texture, turgor are normal, no rashes or significant lesions EYES: normal, Conjunctiva are pink and non-injected, sclera clear NECK: supple, thyroid normal size, non-tender, without  nodularity LYMPH:  no palpable lymphadenopathy in the cervical, axillary  LUNGS: clear to auscultation and percussion with normal breathing effort HEART: regular rate & rhythm and no murmurs and no lower extremity edema ABDOMEN:abdomen soft, non-tender and normal bowel sounds Musculoskeletal:no cyanosis of digits and no clubbing  NEURO: alert & oriented x 3 with fluent speech, no focal motor/sensory deficits   LABORATORY DATA:  I have reviewed the data as listed    Latest Ref Rng & Units 04/10/2023    7:48 AM 03/27/2023    8:19 AM 03/13/2023   10:29 AM  CBC  WBC 4.0 - 10.5 K/uL 10.4  10.5  13.3   Hemoglobin 12.0 - 15.0 g/dL 98.1  19.1  47.8   Hematocrit 36.0 - 46.0 % 35.2  34.5  34.3   Platelets 150 - 400 K/uL 217  242  218         Latest Ref Rng & Units 04/10/2023    7:48 AM 03/27/2023    8:19 AM 03/13/2023   10:29 AM  CMP  Glucose 70 - 99 mg/dL 295  621  308   BUN 8 - 23 mg/dL 15  14  17    Creatinine 0.44 - 1.00 mg/dL 6.57  8.46  9.62   Sodium 135 - 145 mmol/L 143  143  143   Potassium 3.5 - 5.1  mmol/L 3.6  3.5  3.9   Chloride 98 - 111 mmol/L 109  111  111   CO2 22 - 32 mmol/L 26  25  26    Calcium 8.9 - 10.3 mg/dL 9.2  9.0  9.3   Total Protein 6.5 - 8.1 g/dL 6.2  6.3  6.2   Total Bilirubin <1.2 mg/dL 0.7  0.7  0.7   Alkaline Phos Kim - 126 U/L 110  138  138   AST 15 - 41 U/L 20  22  27    ALT 0 - 44 U/L 15  18  19        RADIOGRAPHIC STUDIES: I have personally reviewed the radiological images as listed and agreed with the findings in the report. No results found.    No orders of the defined types were placed in this encounter.  All questions were answered. The patient knows to call the clinic with any problems, questions or concerns. No barriers to learning was detected. The total time spent in the appointment was 25 minutes.     [name], MD 04/10/2023

## 2023-04-10 NOTE — Patient Instructions (Signed)
[facility] - A DEPT OF [facility]   Discharge Instructions: Thank you for choosing [facility] to provide your oncology and hematology care.   If you have a lab appointment with the Cancer Center, please go directly to the Cancer Center and check in at the registration area.   Wear comfortable clothing and clothing appropriate for easy access to any Portacath or PICC line.   We strive to give you quality time with your provider. You may need to reschedule your appointment if you arrive late (15 or more minutes).  Arriving late affects you and other patients whose appointments are after yours.  Also, if you miss three or more appointments without notifying the office, you may be dismissed from the clinic at the provider's discretion.      For prescription refill requests, have your pharmacy contact our office and allow 72 hours for refills to be completed.    Today you received the following chemotherapy and/or immunotherapy agents: Bevacizumab (Mvasi), Irinotecan, and Fluorouracil (Adrucil)      To help prevent nausea and vomiting after your treatment, we encourage you to take your nausea medication as directed.  BELOW ARE SYMPTOMS THAT SHOULD BE REPORTED IMMEDIATELY: *FEVER GREATER THAN 100.4 F (38 C) OR HIGHER *CHILLS OR SWEATING *NAUSEA AND VOMITING THAT IS NOT CONTROLLED WITH YOUR NAUSEA MEDICATION *UNUSUAL SHORTNESS OF BREATH *UNUSUAL BRUISING OR BLEEDING *URINARY PROBLEMS (pain or burning when urinating, or frequent urination) *BOWEL PROBLEMS (unusual diarrhea, constipation, pain near the anus) TENDERNESS IN MOUTH AND THROAT WITH OR WITHOUT PRESENCE OF ULCERS (sore throat, sores in mouth, or a toothache) UNUSUAL RASH, SWELLING OR PAIN  UNUSUAL VAGINAL DISCHARGE OR ITCHING   Items with * indicate a potential emergency and should be followed up as soon as possible or go to the Emergency Department if any problems should  occur.  Please show the CHEMOTHERAPY ALERT CARD or IMMUNOTHERAPY ALERT CARD at check-in to the Emergency Department and triage nurse.  Should you have questions after your visit or need to cancel or reschedule your appointment, please contact [facility] - A DEPT OF [facility]  Dept: [phone]  and follow the prompts.  Office hours are 8:00 a.m. to 4:30 p.m. Monday - Friday. Please note that voicemails left after 4:00 p.m. may not be returned until the following business day.  We are closed weekends and major holidays. You have access to a nurse at all times for urgent questions. Please call the main number to the clinic Dept: [phone] and follow the prompts.   For any non-urgent questions, you may also contact your provider using MyChart. We now offer e-Visits for anyone 78 and older to request care online for non-urgent symptoms. For details visit [email or web address].   Also download the MyChart app! Go to the app store, search "MyChart", open the app, select , and log in with your MyChart username and password.

## 2023-04-12 ENCOUNTER — Inpatient Hospital Stay: Payer: Medicare Other

## 2023-04-12 ENCOUNTER — Other Ambulatory Visit: Payer: Self-pay

## 2023-04-12 ENCOUNTER — Encounter: Payer: Self-pay | Admitting: Hematology

## 2023-04-12 DIAGNOSIS — G620 Drug-induced polyneuropathy: Secondary | ICD-10-CM | POA: Diagnosis not present

## 2023-04-12 DIAGNOSIS — C787 Secondary malignant neoplasm of liver and intrahepatic bile duct: Secondary | ICD-10-CM | POA: Diagnosis not present

## 2023-04-12 DIAGNOSIS — Z86711 Personal history of pulmonary embolism: Secondary | ICD-10-CM | POA: Diagnosis not present

## 2023-04-12 DIAGNOSIS — C182 Malignant neoplasm of ascending colon: Secondary | ICD-10-CM | POA: Diagnosis not present

## 2023-04-12 DIAGNOSIS — Z923 Personal history of irradiation: Secondary | ICD-10-CM | POA: Diagnosis not present

## 2023-04-12 DIAGNOSIS — T451X5A Adverse effect of antineoplastic and immunosuppressive drugs, initial encounter: Secondary | ICD-10-CM | POA: Diagnosis not present

## 2023-04-12 DIAGNOSIS — Z7901 Long term (current) use of anticoagulants: Secondary | ICD-10-CM | POA: Diagnosis not present

## 2023-04-12 DIAGNOSIS — C7963 Secondary malignant neoplasm of bilateral ovaries: Secondary | ICD-10-CM | POA: Diagnosis not present

## 2023-04-12 DIAGNOSIS — Z5111 Encounter for antineoplastic chemotherapy: Secondary | ICD-10-CM | POA: Diagnosis not present

## 2023-04-12 DIAGNOSIS — Z5189 Encounter for other specified aftercare: Secondary | ICD-10-CM | POA: Diagnosis not present

## 2023-04-12 DIAGNOSIS — C786 Secondary malignant neoplasm of retroperitoneum and peritoneum: Secondary | ICD-10-CM | POA: Diagnosis not present

## 2023-04-12 DIAGNOSIS — R197 Diarrhea, unspecified: Secondary | ICD-10-CM | POA: Diagnosis not present

## 2023-04-12 DIAGNOSIS — R918 Other nonspecific abnormal finding of lung field: Secondary | ICD-10-CM | POA: Diagnosis not present

## 2023-04-12 DIAGNOSIS — R110 Nausea: Secondary | ICD-10-CM | POA: Diagnosis not present

## 2023-04-12 MED ORDER — SODIUM CHLORIDE 0.9% FLUSH
10.0000 mL | INTRAVENOUS | Status: DC | PRN
  Administered 2023-04-12: 10 mL

## 2023-04-12 MED ORDER — HEPARIN SOD (PORK) LOCK FLUSH 100 UNIT/ML IV SOLN
500.0000 [IU] | Freq: Once | INTRAVENOUS | Status: AC | PRN
  Administered 2023-04-12: 500 [IU]

## 2023-04-12 MED ORDER — PEGFILGRASTIM INJECTION 6 MG/0.6ML ~~LOC~~
6.0000 mg | PREFILLED_SYRINGE | Freq: Once | SUBCUTANEOUS | Status: AC
  Administered 2023-04-12: 6 mg via SUBCUTANEOUS
  Filled 2023-04-12: qty 0.6

## 2023-04-12 NOTE — Patient Instructions (Signed)

## 2023-04-24 ENCOUNTER — Encounter: Payer: Self-pay | Admitting: Family Medicine

## 2023-04-24 NOTE — Telephone Encounter (Signed)
 Care team updated and letter sent for eye exam notes.

## 2023-05-01 NOTE — Progress Notes (Signed)
 Patient Care Team: [name], MD as PCP - General (Family Medicine) [name], MD as Consulting Physician (General Surgery) [name], MD as Consulting Physician (Hematology) [name], MD as Consulting Physician (General Surgery) [name], MD as Consulting Physician (Hematology and Oncology) [facility], [facility]   Clinic Day:  05/02/2023  Referring physician: [name], MD  ASSESSMENT & PLAN:   Assessment & Plan: Malignant neoplasm of ascending colon [facility]) pT3N2aM0 stage IIB, MSS, metastatic to b/l ovaries and peritoneum and liver, KRAS mutation G12V (+)  -Initially diagnosed in 08/2019, s/p resection with Dr Debby on 11/01/19. Path showed overall Stage IIIB cancer.  -s/p 6 months adjuvant FOLFOX, completed 06/01/20, oxaliplatin  held for final 2 cycles due to neuropathy.  -s/p BSO and hysterectomy on 12/22/21 with Dr. Viktoria. Path revealed metastatic moderately differentiated colonic adenocarcinoma involving both ovaries and peritoneum -she restarted FOLFOX on 01/24/22. -Due to disease progression, chemotherapy changed to second line FOLFIRI and bevacizumab  on April 06, 2022. She has been tolerating moderately well, better now with dose reduction, will continue -Restaging CT scan from 09/27/2022 showed continous response in liver and peritoneal metastasis, no other new lesions.   -We discussed maintenance therapy option in future  -due to her PE in 10/2022, bevacizumab  held for 3 months. -restaging CT 12/31/2022 showed stable disease and liver met is not well defined on CT. She restarted beva on 01/17/23  -restaging CT 03/23/2023 showed overall stable disease (stable small lung nodules, no other evidence of cancer) -05/02/2023 -cycle 28 day 1  Plan: Labs reviewed  -CBC showing WBC 11.3; Hgb 11.0; Hct 34.9; Plt 220; Anc 7.1 -CMP - K 4.5; glucose 132; BUN 23; Creatinine 0.78; eGFR >60; Ca 9.2; AST 30; ALT 22; AlkPhos 135.   Patient taking  pregabalin  for neuropathy which is manageable and does not interfere with normal ADLs.  Due for PET scan at end of February 2025.  Labs and patient condition stable for treatment today. Proceed with Cycle 28 day 1 FOLFIRI with bevacizumab . Labs/flush, follow-up, and treatment as scheduled.    The patient understands the plans discussed today and is in agreement with them.  She knows to contact our office if she develops concerns prior to her next appointment.  I provided 25 minutes of face-to-face time during this encounter and > 50% was spent counseling as documented under my assessment and plan.    Powell FORBES Lessen, NP  [facility] - A DEPT OF [facility] [address] [place] [address] Dept: [phone] Dept Fax: [phone]   No orders of the defined types were placed in this encounter.     CHIEF COMPLAINT:  CC: Cancer of ascending colon  Current Treatment: Second line chemotherapy FOLFIRI and bevacizumab  every 14 days.  INTERVAL HISTORY:  Kim Castro is here today for repeat clinical assessment.  She was last seen by Dr. Lanny on 04/10/2023.  Most recent CT CAP done 03/23/2023 showing overall stable disease with no other evidence of cancer.  She does have some baseline neuropathy in fingers.  She does take Lyrica  to help.  She is also diabetic which likely contributes to neuropathy.  This is manageable with pregabalin  and does not interfere with normal activities.  She does report some diarrhea periodically.  Takes Imodium and/or Lomotil  if needed.  These are effective.  Appetite waxes and wanes depending on where she is in chemotherapy cycle.  Her weight has been stable.  Today is cycle 28 day 1.  She denies fevers or chills. She denies pain. She denies chest pain, chest pressure, or shortness of breath. She denies headaches or visual disturbances.    I have reviewed the past medical history, past surgical history,  social history and family history with the patient and they are unchanged from previous note.  ALLERGIES:  is allergic to oxycodone .  MEDICATIONS:  Current Outpatient Medications  Medication Sig Dispense Refill   apixaban  (ELIQUIS ) 5 MG TABS tablet Take 1 tablet (5 mg total) by mouth 2 (two) times daily. 60 tablet 5   b complex vitamins capsule Take 1 capsule by mouth daily.     Cholecalciferol (VITAMIN D) 2000 UNITS CAPS Take 2,000 Units by mouth daily.      ibuprofen (ADVIL) 200 MG tablet Take 200 mg by mouth daily as needed (pain).     lidocaine -prilocaine  (EMLA ) cream Apply 1 Application topically as needed. 30 g 0   loratadine (CLARITIN) 10 MG tablet Take 10 mg by mouth daily as needed for allergies.     losartan  (COZAAR ) 25 MG tablet Take 1 tablet (25 mg total) by mouth daily. 90 tablet 3   metFORMIN  (GLUCOPHAGE ) 500 MG tablet Take 1 tablet (500 mg total) by mouth in the morning and at bedtime. 180 tablet 3   Multiple Vitamins-Minerals (MULTIVITAMIN WITH MINERALS) tablet Take 1 tablet by mouth daily.     omeprazole (PRILOSEC) 20 MG capsule Take 20 mg by mouth daily before breakfast.      ondansetron  (ZOFRAN ) 8 MG tablet Take 1 tablet (8 mg total) by mouth every 8 (eight) hours as needed for nausea or vomiting. Start on the third day after chemotherapy. 30 tablet 1   potassium chloride  (KLOR-CON ) 10 MEQ tablet Take 1 tablet (10 mEq total) by mouth 2 (two) times daily. 60 tablet 1   pravastatin  (PRAVACHOL ) 10 MG tablet Take 1 tablet (10 mg total) by mouth daily. 90 tablet 3   pregabalin  (LYRICA ) 25 MG capsule Take 1 capsule (25 mg total) by mouth every morning. Take in addition to 50 mg at bedtime 60 capsule 2   pregabalin  (LYRICA ) 50 MG capsule Take 1 capsule (50 mg total) by mouth at bedtime. Take in addition to 25 mg in the morning 60 capsule 2   prochlorperazine  (COMPAZINE ) 10 MG tablet Take 1 tablet (10 mg total) by mouth every 6 (six) hours as needed for nausea or vomiting. 30 tablet  1   triamcinolone  ointment (KENALOG ) 0.5 % Apply 1 Application topically 2 (two) times daily. 30 g 0   No current facility-administered medications for this visit.   Facility-Administered Medications Ordered in Other Visits  Medication Dose Route Frequency Provider Last Rate Last Admin   0.9 %  sodium chloride  infusion   Intravenous Continuous [name], MD 10 mL/hr at 05/02/23 0939 New Bag at 05/02/23 9060   atropine  injection 0.5 mg  0.5 mg Intravenous Once PRN [name], MD       bevacizumab -awwb (MVASI ) 500 mg in sodium chloride  0.9 % 100 mL chemo infusion  5 mg/kg (Treatment Plan Recorded) Intravenous Once [name], MD       fluorouracil  (ADRUCIL ) 5,000 mg in sodium chloride  0.9 % 150 mL chemo infusion  2,400 mg/m2 (Treatment Plan Recorded) Intravenous 1 day or 1 dose [name], MD       heparin  lock flush 100 unit/mL  500 Units Intracatheter Once PRN [name], MD       irinotecan  (CAMPTOSAR )  220 mg in sodium chloride  0.9 % 500 mL chemo infusion  100 mg/m2 (Treatment Plan Recorded) Intravenous Once [name], MD       leucovorin  896 mg in sodium chloride  0.9 % 250 mL infusion  400 mg/m2 (Treatment Plan Recorded) Intravenous Once [name], MD       sodium chloride  flush (NS) 0.9 % injection 10 mL  10 mL Intracatheter PRN [name], MD        HISTORY OF PRESENT ILLNESS:   Oncology History Overview Note   Cancer Staging  Malignant neoplasm of female breast [facility]) Staging form: Breast, AJCC 7th Edition - Clinical stage from 06/11/2014: Stage 0 (Tis (DCIS), N0, M0) - Unsigned Staged by: Pathologist and managing physician Laterality: Left Estrogen receptor status: Positive Progesterone receptor status: Positive Stage used in treatment planning: Yes National guidelines used in treatment planning: Yes Type of national guideline used in treatment planning: NCCN - Pathologic stage from 07/03/2014: Stage Unknown (Tis (DCIS), NX, cM0) - Signed by Guinevere Chew, MD on 07/10/2014 Staged by:  Pathologist Laterality: Left Estrogen receptor status: Positive Progesterone receptor status: Positive Stage used in treatment planning: Yes National guidelines used in treatment planning: Yes Type of national guideline used in treatment planning: NCCN Staging comments: Staged on final lumpectomy specimen by Dr. Loral.  Right colon cancer Staging form: Colon and Rectum, AJCC 8th Edition - Pathologic stage from 11/01/2019: Stage IIIB (pT3, pN2a, cM0) - Signed by [name], MD on 11/29/2019 Stage prefix: Initial diagnosis Histologic grading system: 4 grade system Histologic grade (G): G2 Residual tumor (R): R0 - None Tumor deposits (TD): Absent Perineural invasion (PNI): Absent Microsatellite instability (MSI): Stable KRAS mutation: Unknown NRAS mutation: Unknown BRAF mutation: Unknown     Malignant neoplasm of female breast (HCC)  05/29/2014 Initial Biopsy   Left breast needle core biopsy: Grade 2, DCIS with calcs. ER+ (100%), PR+ (96%).    06/04/2014 Initial Diagnosis   Left breast DCIS with calcifications, ER 100%, PR 96%   06/10/2014 Breast MRI   Left breast: 2.4 x 1.3 x 1.1 cm area of patchy non-mass enhancement upper outer quadrant includes postbiopsy seroma; Right breast: 1.2 cm previously biopsied stable benign fibroadenoma   06/12/2014 Procedure   Genetic counseling/testing: Identified 1 VUS on CHEK2 gene. Remainder of 17 gene panel tested negative and included: ATM, BARD1, BRCA 1/2, BRIP1, CDH1, CHEK2, EPCAM, MLH1, MSH2, MSH6, NBN, NF1, PALB2, PTEN, RAD50, RAD51C, RAD51D, STK11, and TP53.    07/01/2014 Surgery   Left breast lumpectomy ([facility]): Grade 1, DCIS, spanning 2.3 cm, 1 mm margin, ER 100%, PR 96%   07/31/2014 - 08/28/2014 Radiation Therapy   Adjuvant RT completed Signe). Left breast: Total dose 42.5 Gy over 17 fractions. Left breast boost: Total dose 7.5 Gy over 3 fractions.    09/14/2014 - 01/13/2020 Anti-estrogen oral therapy   Anastrazole 1mg  daily. Planned  duration of treatment: 5 years ([facility]). Completed in 01/2020.    09/25/2014 Survivorship   Survivorship Care Plan given to patient and reviewed with her in person.    03/02/2021 Imaging   CT CAP  IMPRESSION: 1. No findings of active/recurrent malignancy. Partial right hemicolectomy. 2. Endometrial stripe remains mildly thickened, but endometrial biopsy in May was negative for malignancy. 3. Progressive endplate sclerosis and endplate irregularity at T2-3, probably due to degenerative endplate findings. If the has referable upper thoracic pain/symptoms then thoracic spine MRI could be used for further workup. 4. Other imaging findings of potential clinical significance: Mild cardiomegaly. Aortic Atherosclerosis (ICD10-I70.0).  Mild mitral valve calcification. Postoperative findings in the left breast with adjacent radiation port anteriorly in the left upper lobe. Tiny pulmonary nodules in the left lower lobe are unchanged from earliest available comparison of 10/17/2019 and probably benign although may merit surveillance. Multilevel lumbar impingement. Mild pelvic floor laxity.   Malignant neoplasm of ascending colon (HCC)  09/19/2019 Imaging   CT AP W contrast 09/19/19  IMPRESSION Fullness in the cecum, cannot exclude a mass. No evidence for metastatic disease is identified.    09/24/2019 Procedure   Colonoscopy by Dr Ernesto 09/24/19 IMPRESSION 1. The colon was redundant  2. Mild diverticulosis was noted through the entire examined colon 3. Single 12mm polyp was found in the ascending colon; polypectomy was performed using snare cautery and biopsy forceps 4. Mild diverticulosis was notes in the descending colon and sigmoid colon.  5. Single polyp was found in the sigmoid colon, polypectomy was performed with cold forceps.  6. Single polyp was found in the rectosigmoid colon; polypectomy was performed with cold snare  7. Small internal hemorrhoids  8. Large mass was found at the cecum;  multiple biopsies of the area were performed using cold forceps; injection (tattooing) was performed distal to the mass.    09/24/2019 Initial Biopsy   INTERPRETATION AND DIAGNOSIS:  A. Cecum, biopsy:  Invasive moderately differentiated adenocarcinoma.  see comment  B. Polyp @ ascending colon, polypectomy:  Tubular Adenoma  C. Polyp @ sigmoid colon Polypectomy:  hyperplastic polyp.  D. Polyp @ rectosigmoid colon, Polypectomy:  Hyperplastic Polyp      10/16/2019 Imaging   CT Chest IMPRESSION: 1. Multiple small pulmonary nodules measuring 5 mm or less in size in the lungs. These are nonspecific and are typically considered statistically likely benign. However, given the patient's history of primary malignancy, close attention on follow-up studies is recommended to ensure stability. 2. Aortic atherosclerosis, in addition to right coronary artery disease. Assessment for potential risk factor modification, dietary therapy or pharmacologic therapy may be warranted, if clinically indicated. 3. There are calcifications of the aortic valve and mitral annulus. Echocardiographic correlation for evaluation of potential valvular dysfunction may be warranted if clinically indicated. 4. Small hiatal hernia.   Aortic Atherosclerosis (ICD10-I70.0).   11/01/2019 Initial Diagnosis   Colon cancer (HCC)   11/01/2019 Surgery   LAPAROSCOPIC PARTIAL COLECTOMY by Dr Debby and Dr Sheldon   11/01/2019 Pathology Results   FINAL MICROSCOPIC DIAGNOSIS:   A. COLON, PROXIMAL RIGHT, COLECTOMY:  - Invasive colonic adenocarcinoma, 5 cm.  - Tumor invades through the muscularis propria into pericolonic tissues.   - Margins of resection are not involved.  - Metastatic carcinoma in (5) of (13) lymph nodes.  - See oncology table.    MSI Stable  Mismatch repair normal  MLH1 - Preserved nuclear expression (greater 50% tumor expression) MSH2 - Preserved nuclear expression (greater 50% tumor expression) MSH6 -  Preserved nuclear expression (greater 50% tumor expression) PMS2 - Preserved nuclear expression (greater 50% tumor expression)   11/01/2019 Cancer Staging   Staging form: Colon and Rectum, AJCC 8th Edition - Pathologic stage from 11/01/2019: Stage IIIB (pT3, pN2a, cM0) - Signed by [name], MD on 11/29/2019   12/10/2019 Procedure   PAC placed 12/10/19   12/17/2019 - 06/01/2020 Chemotherapy   FOLFOX q2weeks starting in 2 weeks starting 12/17/19. Held 01/27/20-02/10/20 due to b/l PE. Oxaliplatin  held C11-12 due to neuropathy. Completed on 06/01/20   03/02/2021 Imaging   CT CAP  IMPRESSION: 1. No findings of active/recurrent malignancy. Partial  right hemicolectomy. 2. Endometrial stripe remains mildly thickened, but endometrial biopsy in May was negative for malignancy. 3. Progressive endplate sclerosis and endplate irregularity at T2-3, probably due to degenerative endplate findings. If the has referable upper thoracic pain/symptoms then thoracic spine MRI could be used for further workup. 4. Other imaging findings of potential clinical significance: Mild cardiomegaly. Aortic Atherosclerosis (ICD10-I70.0). Mild mitral valve calcification. Postoperative findings in the left breast with adjacent radiation port anteriorly in the left upper lobe. Tiny pulmonary nodules in the left lower lobe are unchanged from earliest available comparison of 10/17/2019 and probably benign although may merit surveillance. Multilevel lumbar impingement. Mild pelvic floor laxity.   08/26/2021 Imaging   EXAM: CT CHEST, ABDOMEN, AND PELVIS WITH CONTRAST  IMPRESSION: 1. Stable examination without new or progressive findings to suggest local recurrence or metastatic disease within the chest, abdomen, or pelvis. 2. Hepatomegaly with hepatic steatosis. 3. Sigmoid colonic diverticulosis without findings of acute diverticulitis. 4. Similar prominent endplate sclerosis and irregularity at T2-T3 is most consistent with  Modic type endplate degenerative changes. However, if patient has referable upper thoracic pain consider further workup with thoracic spine MRI. 5. Similar thickening of the endometrial stripe measuring up to 8 mm, which was previously biopsied with results negative for malignancy. 6. Aortic Atherosclerosis (ICD10-I70.0).   11/10/2021 PET scan   IMPRESSION: 1. LEFT ovary is increased in size and is intensely hypermetabolic. While physiologic hypermetabolic ovarian tissue is not uncommon, the enlargement and asymmetric activity warrants further evaluation. Consider contrast pelvic MRI vs tissue sampling. 2. No evidence of metastatic colorectal carcinoma otherwise. 3. Post RIGHT hemicolectomy anatomy. 4. Evidence of radiation change in the LEFT upper lobe (remote breast cancer).     12/22/2021 Relapse/Recurrence    FINAL MICROSCOPIC DIAGNOSIS:   A. LEFT OVARY AND FALLOPIAN TUBE, SALPINGO OOPHORECTOMY:  - Metastatic moderately differentiated colonic adenocarcinoma involving left ovary  - Focal ovarian stromal calcification  - Segment of benign fallopian tube   B. UTERUS WITH RIGHT FALLOPIAN TUBE AND OVARY, HYSTERECTOMY AND SALPINGO-OOPHORECTOMY:  - Metastatic moderately differentiated colonic adenocarcinoma involving right ovary  - Focal invasive extensive adenomyosis  - Benign endometrial polyps  - Benign proliferative phase endometrium  - Hydrosalpinx of right fallopian tube  - Focal ovarian stromal calcification   C. PERITONEAL DEPOSITS, ANTERIOR CUL DE SAC, BIOPSY:  - Metastatic mucinous adenocarcinoma, consistent with colorectal primary    COMMENT:  Immunohistochemical stains show that the tumor cells are positive for CK20 and CDX2 while they are negative for CK7 and PAX8, consistent with above interpretation.    01/24/2022 - 03/23/2022 Chemotherapy   Patient is on Treatment Plan : COLORECTAL FOLFOX q14d x 3 months     04/01/2022 Imaging   CT AP IMPRESSION: 1. New omental  metastatic disease. 2. Vague hypoattenuating lesion in the inferior right hepatic lobe is new and also worrisome for metastatic disease. 3. Similar small right lower lobe nodules. Recommend continued attention on follow-up. 4. Steatotic enlarged liver. 5.  Aortic atherosclerosis (ICD10-I70.0).   04/06/2022 -  Chemotherapy   Patient is on Treatment Plan : COLORECTAL FOLFIRI + Bevacizumab  q14d     04/21/2022 Imaging    IMPRESSION: 1. Stable chest CT. No evidence of metastatic disease. 2. Stable small pulmonary nodules, considered benign based on stability. 3. Stable postsurgical changes in the left breast and anterior left upper lobe radiation changes. 4. Aortic Atherosclerosis (ICD10-I70.0) and Emphysema (ICD10-J43.9).   04/21/2022 Imaging    IMPRESSION: 1. Small enhancing lesion inferiorly in the right  hepatic lobe is typical of metastatic disease and stable from recent abdominal CT. No other definite liver lesions are identified. Mild hepatic steatosis. 2. No other significant abdominal findings. 3. Omental nodularity seen on CT is not well visualized by MRI.    Imaging     06/23/2022 Imaging    IMPRESSION: 1. Hypodense lesion of the inferior right lobe of the liver, hepatic segment VI is diminished in size, consistent with treatment response. 2. Tiny peritoneal and omental nodules identified by prior examination are diminished in size, consistent with treatment response. 3. Multiple tiny pulmonary nodules unchanged, most likely benign and incidental, however continued attention on follow-up warranted in the setting of known metastatic disease. 4. No evidence of new metastatic disease in the chest, abdomen, or pelvis. 5. Status post partial right hemicolectomy and reanastomosis. 6. Diffuse mosaic attenuation of the airspaces, consistent with small airways disease. 7. Hepatomegaly.       REVIEW OF SYSTEMS:   Constitutional: Denies fevers, chills or abnormal weight  loss Eyes: Denies blurriness of vision. Reports presence of cataracts. Ears, nose, mouth, throat, and face: Denies mucositis or sore throat Respiratory: Denies cough, dyspnea or wheezes Cardiovascular: Denies palpitation, chest discomfort or lower extremity swelling Gastrointestinal:  Denies nausea, heartburn or change in bowel habits Skin: Denies abnormal skin rashes Lymphatics: Denies new lymphadenopathy or easy bruising Neurological:Denies numbness, tingling or new weaknesses Behavioral/Psych: Mood is stable, no new changes  All other systems were reviewed with the patient and are negative.   VITALS:   Today's Vitals   05/02/23 0824 05/02/23 0825 05/02/23 0828  BP: (!) 123/45 131/65   Pulse: 71    Resp: 18    Temp: (!) 97.2 F (36.2 C)    TempSrc: Temporal    SpO2: 99%    Weight: 213 lb 4.8 oz (96.8 kg)    PainSc:   0-No pain   Body mass index is 34.96 kg/m.   Wt Readings from Last 3 Encounters:  05/02/23 213 lb 4.8 oz (96.8 kg)  04/10/23 214 lb (97.1 kg)  03/27/23 212 lb 6 oz (96.3 kg)    Body mass index is 34.96 kg/m.  Performance status (ECOG): 1 - Symptomatic but completely ambulatory  PHYSICAL EXAM:   GENERAL:alert, no distress and comfortable SKIN: skin color, texture, turgor are normal, no rashes or significant lesions EYES: normal, Conjunctiva are pink and non-injected, sclera clear OROPHARYNX:no exudate, no erythema and lips, buccal mucosa, and tongue normal  NECK: supple, thyroid normal size, non-tender, without nodularity LYMPH:  no palpable lymphadenopathy in the cervical, axillary or inguinal LUNGS: clear to auscultation and percussion with normal breathing effort HEART: regular rate & rhythm and no murmurs and no lower extremity edema ABDOMEN:abdomen soft, non-tender and normal bowel sounds Musculoskeletal:no cyanosis of digits and no clubbing  NEURO: alert & oriented x 3 with fluent speech, no focal motor/sensory deficits  LABORATORY DATA:  I  have reviewed the data as listed    Component Value Date/Time   NA 142 05/02/2023 0805   NA 144 10/03/2014 0914   K 4.5 05/02/2023 0805   K 4.6 10/03/2014 0914   CL 110 05/02/2023 0805   CO2 25 05/02/2023 0805   CO2 29 10/03/2014 0914   GLUCOSE 132 (H) 05/02/2023 0805   GLUCOSE 133 10/03/2014 0914   BUN 23 05/02/2023 0805   BUN 17.8 10/03/2014 0914   CREATININE 0.78 05/02/2023 0805   CREATININE 0.8 10/03/2014 0914   CALCIUM  9.2 05/02/2023 0805   CALCIUM  9.7 10/03/2014  0914   PROT 6.4 (L) 05/02/2023 0805   PROT 7.0 10/03/2014 0914   ALBUMIN  3.8 05/02/2023 0805   ALBUMIN  3.7 10/03/2014 0914   AST 30 05/02/2023 0805   AST 18 10/03/2014 0914   ALT 22 05/02/2023 0805   ALT 13 10/03/2014 0914   ALKPHOS 135 (H) 05/02/2023 0805   ALKPHOS 74 10/03/2014 0914   BILITOT 0.6 05/02/2023 0805   BILITOT 0.98 10/03/2014 0914   GFRNONAA >60 05/02/2023 0805   GFRAA >60 01/30/2020 0429   GFRAA >60 01/27/2020 0834     Lab Results  Component Value Date   WBC 11.3 (H) 05/02/2023   NEUTROABS 7.1 05/02/2023   HGB 11.0 (L) 05/02/2023   HCT 34.9 (L) 05/02/2023   MCV 102.9 (H) 05/02/2023   PLT 220 05/02/2023

## 2023-05-01 NOTE — Assessment & Plan Note (Signed)
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 Path showed overall Stage IIIB cancer.  -s/p 6 months adjuvant FOLFOX, completed 06/01/20, oxaliplatin held for final 2 cycles due to neuropathy.  -s/p BSO and hysterectomy on 12/22/21 with Dr. Pricilla Holm. Path revealed metastatic moderately differentiated colonic adenocarcinoma involving both ovaries and peritoneum -she restarted FOLFOX on 01/24/22. -Due to disease progression, chemotherapy changed to second line FOLFIRI and bevacizumab on April 06, 2022. She has been tolerating moderately well, better now with dose reduction, will continue -Restaging CT scan from 09/27/2022 showed continous response in liver and peritoneal metastasis, no other new lesions.   -We discussed maintenance therapy option in future  -due to her PE in 10/2022, bevacizumab held for 3 months. -restaging CT 12/31/2022 showed stable disease and liver met is not well defined on CT. She restarted beva on 01/17/23  -restaging CT 03/23/2023 showed overall stable disease (stable small lung nodules, no other evidence of cancer) -05/02/2023 -cycle 28 day 1

## 2023-05-02 ENCOUNTER — Encounter: Payer: Self-pay | Admitting: Nurse Practitioner

## 2023-05-02 ENCOUNTER — Inpatient Hospital Stay: Payer: Medicare Other

## 2023-05-02 ENCOUNTER — Inpatient Hospital Stay: Payer: Medicare Other | Admitting: Nurse Practitioner

## 2023-05-02 VITALS — BP 131/65 | HR 71 | Temp 97.2°F | Resp 18 | Wt 213.3 lb

## 2023-05-02 DIAGNOSIS — R110 Nausea: Secondary | ICD-10-CM | POA: Diagnosis not present

## 2023-05-02 DIAGNOSIS — C182 Malignant neoplasm of ascending colon: Secondary | ICD-10-CM | POA: Diagnosis not present

## 2023-05-02 DIAGNOSIS — R197 Diarrhea, unspecified: Secondary | ICD-10-CM | POA: Diagnosis not present

## 2023-05-02 DIAGNOSIS — C787 Secondary malignant neoplasm of liver and intrahepatic bile duct: Secondary | ICD-10-CM | POA: Diagnosis not present

## 2023-05-02 DIAGNOSIS — T451X5A Adverse effect of antineoplastic and immunosuppressive drugs, initial encounter: Secondary | ICD-10-CM | POA: Diagnosis not present

## 2023-05-02 DIAGNOSIS — C786 Secondary malignant neoplasm of retroperitoneum and peritoneum: Secondary | ICD-10-CM | POA: Diagnosis not present

## 2023-05-02 DIAGNOSIS — Z7901 Long term (current) use of anticoagulants: Secondary | ICD-10-CM | POA: Diagnosis not present

## 2023-05-02 DIAGNOSIS — Z86711 Personal history of pulmonary embolism: Secondary | ICD-10-CM | POA: Diagnosis not present

## 2023-05-02 DIAGNOSIS — Z923 Personal history of irradiation: Secondary | ICD-10-CM | POA: Diagnosis not present

## 2023-05-02 DIAGNOSIS — G620 Drug-induced polyneuropathy: Secondary | ICD-10-CM | POA: Diagnosis not present

## 2023-05-02 DIAGNOSIS — Z95828 Presence of other vascular implants and grafts: Secondary | ICD-10-CM

## 2023-05-02 DIAGNOSIS — Z5111 Encounter for antineoplastic chemotherapy: Secondary | ICD-10-CM | POA: Diagnosis not present

## 2023-05-02 DIAGNOSIS — C7963 Secondary malignant neoplasm of bilateral ovaries: Secondary | ICD-10-CM | POA: Diagnosis not present

## 2023-05-02 DIAGNOSIS — R918 Other nonspecific abnormal finding of lung field: Secondary | ICD-10-CM | POA: Diagnosis not present

## 2023-05-02 DIAGNOSIS — Z5189 Encounter for other specified aftercare: Secondary | ICD-10-CM | POA: Diagnosis not present

## 2023-05-02 LAB — CMP (CANCER CENTER ONLY)
ALT: 22 U/L (ref 0–44)
AST: 30 U/L (ref 15–41)
Albumin: 3.8 g/dL (ref 3.5–5.0)
Alkaline Phosphatase: 135 U/L — ABNORMAL HIGH (ref 38–126)
Anion gap: 7 (ref 5–15)
BUN: 23 mg/dL (ref 8–23)
CO2: 25 mmol/L (ref 22–32)
Calcium: 9.2 mg/dL (ref 8.9–10.3)
Chloride: 110 mmol/L (ref 98–111)
Creatinine: 0.78 mg/dL (ref 0.44–1.00)
GFR, Estimated: >60 mL/min (ref >60)
Glucose, Bld: 132 mg/dL — ABNORMAL HIGH (ref 70–99)
Potassium: 4.5 mmol/L (ref 3.5–5.1)
Sodium: 142 mmol/L (ref 135–145)
Total Bilirubin: 0.6 mg/dL (ref 0.0–1.2)
Total Protein: 6.4 g/dL — ABNORMAL LOW (ref 6.5–8.1)

## 2023-05-02 LAB — CBC WITH DIFFERENTIAL (CANCER CENTER ONLY)
Abs Immature Granulocytes: 0.13 10*3/uL — ABNORMAL HIGH (ref 0.00–0.07)
Basophils Absolute: 0.1 10*3/uL (ref 0.0–0.1)
Basophils Relative: 1 %
Eosinophils Absolute: 0.3 10*3/uL (ref 0.0–0.5)
Eosinophils Relative: 3 %
HCT: 34.9 % — ABNORMAL LOW (ref 36.0–46.0)
Hemoglobin: 11 g/dL — ABNORMAL LOW (ref 12.0–15.0)
Immature Granulocytes: 1 %
Lymphocytes Relative: 24 %
Lymphs Abs: 2.7 10*3/uL (ref 0.7–4.0)
MCH: 32.4 pg (ref 26.0–34.0)
MCHC: 31.5 g/dL (ref 30.0–36.0)
MCV: 102.9 fL — ABNORMAL HIGH (ref 80.0–100.0)
Monocytes Absolute: 1 10*3/uL (ref 0.1–1.0)
Monocytes Relative: 8 %
Neutro Abs: 7.1 10*3/uL (ref 1.7–7.7)
Neutrophils Relative %: 63 %
Platelet Count: 220 10*3/uL (ref 150–400)
RBC: 3.39 MIL/uL — ABNORMAL LOW (ref 3.87–5.11)
RDW: 16.5 % — ABNORMAL HIGH (ref 11.5–15.5)
WBC Count: 11.3 10*3/uL — ABNORMAL HIGH (ref 4.0–10.5)
nRBC: 0 % (ref 0.0–0.2)

## 2023-05-02 LAB — CEA (ACCESS): CEA (CHCC): 5.96 ng/mL — ABNORMAL HIGH (ref 0.00–5.00)

## 2023-05-02 MED ORDER — SODIUM CHLORIDE 0.9 % IV SOLN
400.0000 mg/m2 | Freq: Once | INTRAVENOUS | Status: AC
  Administered 2023-05-02: 896 mg via INTRAVENOUS
  Filled 2023-05-02: qty 44.8

## 2023-05-02 MED ORDER — SODIUM CHLORIDE 0.9% FLUSH
10.0000 mL | INTRAVENOUS | Status: DC | PRN
Start: 2023-05-02 — End: 2023-05-02
  Administered 2023-05-02: 10 mL

## 2023-05-02 MED ORDER — ATROPINE SULFATE 1 MG/ML IV SOLN
0.5000 mg | Freq: Once | INTRAVENOUS | Status: AC | PRN
  Administered 2023-05-02: 0.5 mg via INTRAVENOUS
  Filled 2023-05-02: qty 1

## 2023-05-02 MED ORDER — SODIUM CHLORIDE 0.9 % IV SOLN
100.0000 mg/m2 | Freq: Once | INTRAVENOUS | Status: AC
Start: 2023-05-02 — End: 2023-05-02
  Administered 2023-05-02: 220 mg via INTRAVENOUS
  Filled 2023-05-02: qty 11

## 2023-05-02 MED ORDER — DEXAMETHASONE SODIUM PHOSPHATE 10 MG/ML IJ SOLN
10.0000 mg | Freq: Once | INTRAMUSCULAR | Status: AC
  Administered 2023-05-02: 10 mg via INTRAVENOUS
  Filled 2023-05-02: qty 1

## 2023-05-02 MED ORDER — HEPARIN SOD (PORK) LOCK FLUSH 100 UNIT/ML IV SOLN: 500.0000 [IU] | Freq: Once | INTRAVENOUS | Status: DC | PRN

## 2023-05-02 MED ORDER — SODIUM CHLORIDE 0.9 % IV SOLN: INTRAVENOUS | Status: DC

## 2023-05-02 MED ORDER — SODIUM CHLORIDE 0.9% FLUSH
10.0000 mL | INTRAVENOUS | Status: DC | PRN
Start: 2023-05-02 — End: 2023-05-02

## 2023-05-02 MED ORDER — SODIUM CHLORIDE 0.9 % IV SOLN
2400.0000 mg/m2 | INTRAVENOUS | Status: DC
  Administered 2023-05-02: 5000 mg via INTRAVENOUS
  Filled 2023-05-02: qty 100

## 2023-05-02 MED ORDER — PALONOSETRON HCL INJECTION 0.25 MG/5ML
0.2500 mg | Freq: Once | INTRAVENOUS | Status: AC
  Administered 2023-05-02: 0.25 mg via INTRAVENOUS
  Filled 2023-05-02: qty 5

## 2023-05-02 MED ORDER — SODIUM CHLORIDE 0.9 % IV SOLN
5.0000 mg/kg | Freq: Once | INTRAVENOUS | Status: AC
  Administered 2023-05-02: 500 mg via INTRAVENOUS
  Filled 2023-05-02: qty 4

## 2023-05-02 NOTE — Patient Instructions (Signed)
 [facility] - A DEPT OF [facility]   Discharge Instructions: Thank you for choosing [facility] to provide your oncology and hematology care.   If you have a lab appointment with the Cancer Center, please go directly to the Cancer Center and check in at the registration area.   Wear comfortable clothing and clothing appropriate for easy access to any Portacath or PICC line.   We strive to give you quality time with your provider. You may need to reschedule your appointment if you arrive late (15 or more minutes).  Arriving late affects you and other patients whose appointments are after yours.  Also, if you miss three or more appointments without notifying the office, you may be dismissed from the clinic at the provider's discretion.      For prescription refill requests, have your pharmacy contact our office and allow 72 hours for refills to be completed.    Today you received the following chemotherapy and/or immunotherapy agents: Bevacizumab (Mvasi), Irinotecan, and Fluorouracil (Adrucil)      To help prevent nausea and vomiting after your treatment, we encourage you to take your nausea medication as directed.  BELOW ARE SYMPTOMS THAT SHOULD BE REPORTED IMMEDIATELY: *FEVER GREATER THAN 100.4 F (38 C) OR HIGHER *CHILLS OR SWEATING *NAUSEA AND VOMITING THAT IS NOT CONTROLLED WITH YOUR NAUSEA MEDICATION *UNUSUAL SHORTNESS OF BREATH *UNUSUAL BRUISING OR BLEEDING *URINARY PROBLEMS (pain or burning when urinating, or frequent urination) *BOWEL PROBLEMS (unusual diarrhea, constipation, pain near the anus) TENDERNESS IN MOUTH AND THROAT WITH OR WITHOUT PRESENCE OF ULCERS (sore throat, sores in mouth, or a toothache) UNUSUAL RASH, SWELLING OR PAIN  UNUSUAL VAGINAL DISCHARGE OR ITCHING   Items with * indicate a potential emergency and should be followed up as soon as possible or go to the Emergency Department if any problems should  occur.  Please show the CHEMOTHERAPY ALERT CARD or IMMUNOTHERAPY ALERT CARD at check-in to the Emergency Department and triage nurse.  Should you have questions after your visit or need to cancel or reschedule your appointment, please contact [facility]  Dept: [phone]  and follow the prompts.  Office hours are 8:00 a.m. to 4:30 p.m. Monday - Friday. Please note that voicemails left after 4:00 p.m. may not be returned until the following business day.  We are closed weekends and major holidays. You have access to a nurse at all times for urgent questions. Please call the main number to the clinic Dept: [phone] and follow the prompts.   For any non-urgent questions, you may also contact your provider using MyChart. We now offer e-Visits for anyone 78 and older to request care online for non-urgent symptoms. For details visit [email or web address].   Also download the MyChart app! Go to the app store, search "MyChart", open the app, select , and log in with your MyChart username and password.

## 2023-05-04 ENCOUNTER — Inpatient Hospital Stay: Payer: Medicare Other | Attending: Hematology

## 2023-05-04 VITALS — BP 131/65 | HR 62 | Temp 98.4°F | Resp 17

## 2023-05-04 DIAGNOSIS — Z5189 Encounter for other specified aftercare: Secondary | ICD-10-CM | POA: Insufficient documentation

## 2023-05-04 DIAGNOSIS — Z90710 Acquired absence of both cervix and uterus: Secondary | ICD-10-CM | POA: Insufficient documentation

## 2023-05-04 DIAGNOSIS — Z5111 Encounter for antineoplastic chemotherapy: Secondary | ICD-10-CM | POA: Diagnosis not present

## 2023-05-04 DIAGNOSIS — Z853 Personal history of malignant neoplasm of breast: Secondary | ICD-10-CM | POA: Insufficient documentation

## 2023-05-04 DIAGNOSIS — C182 Malignant neoplasm of ascending colon: Secondary | ICD-10-CM | POA: Diagnosis not present

## 2023-05-04 DIAGNOSIS — Z800 Family history of malignant neoplasm of digestive organs: Secondary | ICD-10-CM | POA: Insufficient documentation

## 2023-05-04 DIAGNOSIS — G620 Drug-induced polyneuropathy: Secondary | ICD-10-CM | POA: Diagnosis not present

## 2023-05-04 DIAGNOSIS — C786 Secondary malignant neoplasm of retroperitoneum and peritoneum: Secondary | ICD-10-CM | POA: Insufficient documentation

## 2023-05-04 DIAGNOSIS — C787 Secondary malignant neoplasm of liver and intrahepatic bile duct: Secondary | ICD-10-CM | POA: Insufficient documentation

## 2023-05-04 DIAGNOSIS — Z95828 Presence of other vascular implants and grafts: Secondary | ICD-10-CM

## 2023-05-04 DIAGNOSIS — Z803 Family history of malignant neoplasm of breast: Secondary | ICD-10-CM | POA: Diagnosis not present

## 2023-05-04 DIAGNOSIS — C7963 Secondary malignant neoplasm of bilateral ovaries: Secondary | ICD-10-CM | POA: Insufficient documentation

## 2023-05-04 DIAGNOSIS — Z90722 Acquired absence of ovaries, bilateral: Secondary | ICD-10-CM | POA: Diagnosis not present

## 2023-05-04 DIAGNOSIS — Z8049 Family history of malignant neoplasm of other genital organs: Secondary | ICD-10-CM | POA: Diagnosis not present

## 2023-05-04 MED ORDER — HEPARIN SOD (PORK) LOCK FLUSH 100 UNIT/ML IV SOLN
500.0000 [IU] | Freq: Once | INTRAVENOUS | Status: AC | PRN
  Administered 2023-05-04: 500 [IU]

## 2023-05-04 MED ORDER — SODIUM CHLORIDE 0.9% FLUSH
10.0000 mL | INTRAVENOUS | Status: DC | PRN
Start: 2023-05-04 — End: 2023-05-04
  Administered 2023-05-04: 10 mL

## 2023-05-04 MED ORDER — PEGFILGRASTIM INJECTION 6 MG/0.6ML ~~LOC~~
6.0000 mg | PREFILLED_SYRINGE | Freq: Once | SUBCUTANEOUS | Status: AC
  Administered 2023-05-04: 6 mg via SUBCUTANEOUS
  Filled 2023-05-04: qty 0.6

## 2023-05-14 NOTE — Assessment & Plan Note (Signed)
-  Dx in 05/2014, s/p left lumpectomy with Dr Johna Sheriff, adjuvant RT with Dr [name], and Anastrozole 08/2014-01/13/20   -DEXA 09/11/20 T score +1.2 normal.  -most recent mammogram on 10/31/2022 was negative.

## 2023-05-14 NOTE — Assessment & Plan Note (Signed)
 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 Path showed overall Stage IIIB cancer.  -s/p 6 months adjuvant FOLFOX, completed 06/01/20, oxaliplatin held for final 2 cycles due to neuropathy.  -s/p BSO and hysterectomy on 12/22/21 with Dr. Pricilla Holm. Path revealed metastatic moderately differentiated colonic adenocarcinoma involving both ovaries and peritoneum -she restarted FOLFOX on 01/24/22. -Due to disease progression, chemotherapy changed to second line FOLFIRI and bevacizumab on April 06, 2022. She has been tolerating moderately well, better now with dose reduction, will continue -Restaging CT scan from 09/27/2022 showed continous response in liver and peritoneal metastasis, no other new lesions.   -We discussed maintenance therapy option in future  -due to her PE in 10/2022, will hold beva for 3 months  -restaging CT 12/31/2022 showed stable disease and live met is not well defined on CT. She restarted beva on 01/17/23  -restaging CT 03/23/2023 showed overall stable disease (stable small lung nodules, no other evidence of cancer)

## 2023-05-15 ENCOUNTER — Inpatient Hospital Stay: Payer: Medicare Other

## 2023-05-15 ENCOUNTER — Encounter: Payer: Self-pay | Admitting: Hematology

## 2023-05-15 ENCOUNTER — Inpatient Hospital Stay: Payer: Medicare Other | Admitting: Hematology

## 2023-05-15 VITALS — BP 130/64 | HR 70 | Temp 97.6°F | Resp 15 | Wt 213.7 lb

## 2023-05-15 VITALS — BP 106/58 | HR 53 | Temp 97.6°F | Resp 16

## 2023-05-15 DIAGNOSIS — C182 Malignant neoplasm of ascending colon: Secondary | ICD-10-CM

## 2023-05-15 DIAGNOSIS — Z90710 Acquired absence of both cervix and uterus: Secondary | ICD-10-CM | POA: Diagnosis not present

## 2023-05-15 DIAGNOSIS — Z5111 Encounter for antineoplastic chemotherapy: Secondary | ICD-10-CM | POA: Diagnosis not present

## 2023-05-15 DIAGNOSIS — C7963 Secondary malignant neoplasm of bilateral ovaries: Secondary | ICD-10-CM | POA: Diagnosis not present

## 2023-05-15 DIAGNOSIS — Z5189 Encounter for other specified aftercare: Secondary | ICD-10-CM | POA: Diagnosis not present

## 2023-05-15 DIAGNOSIS — Z803 Family history of malignant neoplasm of breast: Secondary | ICD-10-CM | POA: Diagnosis not present

## 2023-05-15 DIAGNOSIS — Z170 Estrogen receptor positive status [ER+]: Secondary | ICD-10-CM | POA: Diagnosis not present

## 2023-05-15 DIAGNOSIS — Z95828 Presence of other vascular implants and grafts: Secondary | ICD-10-CM

## 2023-05-15 DIAGNOSIS — C50919 Malignant neoplasm of unspecified site of unspecified female breast: Secondary | ICD-10-CM

## 2023-05-15 DIAGNOSIS — Z90722 Acquired absence of ovaries, bilateral: Secondary | ICD-10-CM | POA: Diagnosis not present

## 2023-05-15 DIAGNOSIS — G620 Drug-induced polyneuropathy: Secondary | ICD-10-CM | POA: Diagnosis not present

## 2023-05-15 DIAGNOSIS — Z800 Family history of malignant neoplasm of digestive organs: Secondary | ICD-10-CM | POA: Diagnosis not present

## 2023-05-15 DIAGNOSIS — C787 Secondary malignant neoplasm of liver and intrahepatic bile duct: Secondary | ICD-10-CM | POA: Diagnosis not present

## 2023-05-15 DIAGNOSIS — Z8049 Family history of malignant neoplasm of other genital organs: Secondary | ICD-10-CM | POA: Diagnosis not present

## 2023-05-15 DIAGNOSIS — Z853 Personal history of malignant neoplasm of breast: Secondary | ICD-10-CM | POA: Diagnosis not present

## 2023-05-15 DIAGNOSIS — C786 Secondary malignant neoplasm of retroperitoneum and peritoneum: Secondary | ICD-10-CM | POA: Diagnosis not present

## 2023-05-15 LAB — CBC WITH DIFFERENTIAL (CANCER CENTER ONLY)
Abs Immature Granulocytes: 0.04 10*3/uL (ref 0.00–0.07)
Basophils Absolute: 0.1 10*3/uL (ref 0.0–0.1)
Basophils Relative: 1 %
Eosinophils Absolute: 0.3 10*3/uL (ref 0.0–0.5)
Eosinophils Relative: 4 %
HCT: 33.1 % — ABNORMAL LOW (ref 36.0–46.0)
Hemoglobin: 10.7 g/dL — ABNORMAL LOW (ref 12.0–15.0)
Immature Granulocytes: 1 %
Lymphocytes Relative: 22 %
Lymphs Abs: 1.7 10*3/uL (ref 0.7–4.0)
MCH: 32.6 pg (ref 26.0–34.0)
MCHC: 32.3 g/dL (ref 30.0–36.0)
MCV: 100.9 fL — ABNORMAL HIGH (ref 80.0–100.0)
Monocytes Absolute: 0.6 10*3/uL (ref 0.1–1.0)
Monocytes Relative: 8 %
Neutro Abs: 5 10*3/uL (ref 1.7–7.7)
Neutrophils Relative %: 64 %
Platelet Count: 203 10*3/uL (ref 150–400)
RBC: 3.28 MIL/uL — ABNORMAL LOW (ref 3.87–5.11)
RDW: 15.9 % — ABNORMAL HIGH (ref 11.5–15.5)
WBC Count: 7.6 10*3/uL (ref 4.0–10.5)
nRBC: 0 % (ref 0.0–0.2)

## 2023-05-15 LAB — CMP (CANCER CENTER ONLY)
ALT: 16 U/L (ref 0–44)
AST: 27 U/L (ref 15–41)
Albumin: 3.9 g/dL (ref 3.5–5.0)
Alkaline Phosphatase: 134 U/L — ABNORMAL HIGH (ref 38–126)
Anion gap: 6 (ref 5–15)
BUN: 16 mg/dL (ref 8–23)
CO2: 25 mmol/L (ref 22–32)
Calcium: 9 mg/dL (ref 8.9–10.3)
Chloride: 109 mmol/L (ref 98–111)
Creatinine: 0.8 mg/dL (ref 0.44–1.00)
GFR, Estimated: >60 mL/min (ref >60)
Glucose, Bld: 110 mg/dL — ABNORMAL HIGH (ref 70–99)
Potassium: 4.6 mmol/L (ref 3.5–5.1)
Sodium: 140 mmol/L (ref 135–145)
Total Bilirubin: 0.7 mg/dL (ref 0.0–1.2)
Total Protein: 6.3 g/dL — ABNORMAL LOW (ref 6.5–8.1)

## 2023-05-15 LAB — TOTAL PROTEIN, URINE DIPSTICK: Protein, ur: NEGATIVE mg/dL

## 2023-05-15 LAB — CEA (ACCESS): CEA (CHCC): 7.54 ng/mL — ABNORMAL HIGH (ref 0.00–5.00)

## 2023-05-15 MED ORDER — SODIUM CHLORIDE 0.9% FLUSH
10.0000 mL | INTRAVENOUS | Status: DC | PRN
Start: 2023-05-15 — End: 2023-05-15
  Administered 2023-05-15: 10 mL

## 2023-05-15 MED ORDER — SODIUM CHLORIDE 0.9 % IV SOLN
400.0000 mg/m2 | Freq: Once | INTRAVENOUS | Status: AC
  Administered 2023-05-15: 896 mg via INTRAVENOUS
  Filled 2023-05-15: qty 44.8

## 2023-05-15 MED ORDER — SODIUM CHLORIDE 0.9 % IV SOLN
100.0000 mg/m2 | Freq: Once | INTRAVENOUS | Status: AC
  Administered 2023-05-15: 220 mg via INTRAVENOUS
  Filled 2023-05-15: qty 11

## 2023-05-15 MED ORDER — PALONOSETRON HCL INJECTION 0.25 MG/5ML
0.2500 mg | Freq: Once | INTRAVENOUS | Status: AC
  Administered 2023-05-15: 0.25 mg via INTRAVENOUS
  Filled 2023-05-15: qty 5

## 2023-05-15 MED ORDER — SODIUM CHLORIDE 0.9 % IV SOLN
2400.0000 mg/m2 | INTRAVENOUS | Status: DC
  Administered 2023-05-15: 5000 mg via INTRAVENOUS
  Filled 2023-05-15: qty 100

## 2023-05-15 MED ORDER — ATROPINE SULFATE 1 MG/ML IV SOLN
0.5000 mg | Freq: Once | INTRAVENOUS | Status: AC | PRN
  Administered 2023-05-15: 0.5 mg via INTRAVENOUS
  Filled 2023-05-15: qty 1

## 2023-05-15 MED ORDER — SODIUM CHLORIDE 0.9 % IV SOLN
INTRAVENOUS | Status: DC
Start: 2023-05-15 — End: 2023-05-15

## 2023-05-15 MED ORDER — SODIUM CHLORIDE 0.9 % IV SOLN
5.0000 mg/kg | Freq: Once | INTRAVENOUS | Status: AC
  Administered 2023-05-15: 500 mg via INTRAVENOUS
  Filled 2023-05-15: qty 4

## 2023-05-15 MED ORDER — DEXAMETHASONE SODIUM PHOSPHATE 10 MG/ML IJ SOLN
10.0000 mg | Freq: Once | INTRAMUSCULAR | Status: AC
  Administered 2023-05-15: 10 mg via INTRAVENOUS
  Filled 2023-05-15: qty 1

## 2023-05-15 NOTE — Patient Instructions (Signed)
 [facility]  Discharge Instructions: Thank you for choosing [facility] to provide your oncology and hematology care.   If you have a lab appointment with the Cancer Center, please go directly to the Cancer Center and check in at the registration area.   Wear comfortable clothing and clothing appropriate for easy access to any Portacath or PICC line.   We strive to give you quality time with your provider. You may need to reschedule your appointment if you arrive late (15 or more minutes).  Arriving late affects you and other patients whose appointments are after yours.  Also, if you miss three or more appointments without notifying the office, you may be dismissed from the clinic at the provider's discretion.      For prescription refill requests, have your pharmacy contact our office and allow 72 hours for refills to be completed.    Today you received the following chemotherapy and/or immunotherapy agents: Bevacizumab, Irinotecan, Leucovorin, 5FU      To help prevent nausea and vomiting after your treatment, we encourage you to take your nausea medication as directed.  BELOW ARE SYMPTOMS THAT SHOULD BE REPORTED IMMEDIATELY: *FEVER GREATER THAN 100.4 F (38 C) OR HIGHER *CHILLS OR SWEATING *NAUSEA AND VOMITING THAT IS NOT CONTROLLED WITH YOUR NAUSEA MEDICATION *UNUSUAL SHORTNESS OF BREATH *UNUSUAL BRUISING OR BLEEDING *URINARY PROBLEMS (pain or burning when urinating, or frequent urination) *BOWEL PROBLEMS (unusual diarrhea, constipation, pain near the anus) TENDERNESS IN MOUTH AND THROAT WITH OR WITHOUT PRESENCE OF ULCERS (sore throat, sores in mouth, or a toothache) UNUSUAL RASH, SWELLING OR PAIN  UNUSUAL VAGINAL DISCHARGE OR ITCHING   Items with * indicate a potential emergency and should be followed up as soon as possible or go to the Emergency Department if any problems should occur.  Please show the  CHEMOTHERAPY ALERT CARD or IMMUNOTHERAPY ALERT CARD at check-in to the Emergency Department and triage nurse.  Should you have questions after your visit or need to cancel or reschedule your appointment, please contact [facility]  Dept: [phone]  and follow the prompts.  Office hours are 8:00 a.m. to 4:30 p.m. Monday - Friday. Please note that voicemails left after 4:00 p.m. may not be returned until the following business day.  We are closed weekends and major holidays. You have access to a nurse at all times for urgent questions. Please call the main number to the clinic Dept: [phone] and follow the prompts.   For any non-urgent questions, you may also contact your provider using MyChart. We now offer e-Visits for anyone 64 and older to request care online for non-urgent symptoms. For details visit [email or web address].   Also download the MyChart app! Go to the app store, search "MyChart", open the app, select [facility], and log in with your MyChart username and password.

## 2023-05-15 NOTE — Progress Notes (Signed)
 [facility]   Telephone:([phone]) [phone] Fax:([phone]) [phone]   Clinic Follow up Note   Patient Care Team: [name], MD as PCP - General (Family Medicine) [name], MD as Consulting Physician (General Surgery) [name], MD as Consulting Physician (Hematology) [name], MD as Consulting Physician (General Surgery) [name], MD as Consulting Physician (Hematology and Oncology) [facility], [facility]   Date of Service:  05/15/2023  CHIEF COMPLAINT: f/u of colon cancer  CURRENT THERAPY:  Chemotherapy FOLFIRI and bevacizumab   Oncology History   Malignant neoplasm of ascending colon (HCC) pT3N2aM0 stage IIB, MSS, metastatic to b/l ovaries and peritoneum and liver, KRAS mutation G12V (+)  -Initially diagnosed in 08/2019, s/p resection with Dr Debby on 11/01/19. Path showed overall Stage IIIB cancer.  -s/p 6 months adjuvant FOLFOX, completed 06/01/20, oxaliplatin  held for final 2 cycles due to neuropathy.  -s/p BSO and hysterectomy on 12/22/21 with Dr. Viktoria. Path revealed metastatic moderately differentiated colonic adenocarcinoma involving both ovaries and peritoneum -she restarted FOLFOX on 01/24/22. -Due to disease progression, chemotherapy changed to second line FOLFIRI and bevacizumab  on April 06, 2022. She has been tolerating moderately well, better now with dose reduction, will continue -Restaging CT scan from 09/27/2022 showed continous response in liver and peritoneal metastasis, no other new lesions.   -We discussed maintenance therapy option in future  -due to her PE in 10/2022, will hold beva for 3 months  -restaging CT 12/31/2022 showed stable disease and live met is not well defined on CT. She restarted beva on 01/17/23  -restaging CT 03/23/2023 showed overall stable disease (stable small lung nodules, no other evidence of cancer)  Malignant neoplasm of female breast (HCC) -Dx in 05/2014, s/p left lumpectomy with Dr  Mikell, adjuvant RT with Dr Keenan, and Anastrozole  08/2014-01/13/20   -DEXA 09/11/20 T score +1.2 normal.  -most recent mammogram on 10/31/2022 was negative.     Assessment and Plan    Metastatic Colon Cancer Follow-up for metastatic colon cancer. Reports increased tingling in fingers, likely due to previous oxaliplatin -induced neuropathy. Current chemotherapy regimen has low neuropathy risk. Weight at 213 lbs. Blood counts and intermittent diarrhea are manageable. Emphasized early scan scheduling for timely insurance approval and ease of scheduling. - Continue current chemotherapy regimen - Order scan for end of February - Schedule appointments for February 10 and 24 - Proceed with treatment if lab results are stable  Chemotherapy-Induced Neuropathy Increased tingling and sensitivity in fingers, likely from previous oxaliplatin  treatment. Symptoms include difficulty handling hot objects. Taking B complex vitamins. Advised on exercise benefits for blood circulation and nerve recovery. - Advise use of gloves and potholders to protect hands from heat - Encourage exercise for blood circulation and nerve recovery - Continue B complex vitamins - Refill pregabalin  25 mg if needed.     Plan -Lab reviewed, CBC is adequate, CMP still pending, will proceed to chemotherapy if CMP is adequate for treatment  -Follow-up in 2 weeks before next cycle chemotherapy -I ordered a restaging CT scan to be done around February 17    SUMMARY OF ONCOLOGIC HISTORY: Oncology History Overview Note   Cancer Staging  Malignant neoplasm of female breast [facility]) Staging form: Breast, AJCC 7th Edition - Clinical stage from 06/11/2014: Stage 0 (Tis (DCIS), N0, M0) - Unsigned Staged by: Pathologist and managing physician Laterality: Left Estrogen receptor status: Positive Progesterone receptor status: Positive Stage used in treatment planning: Yes National guidelines used in treatment planning: Yes Type of  national guideline  used in treatment planning: NCCN - Pathologic stage from 07/03/2014: Stage Unknown (Tis (DCIS), NX, cM0) - Signed by Guinevere Chew, MD on 07/10/2014 Staged by: Pathologist Laterality: Left Estrogen receptor status: Positive Progesterone receptor status: Positive Stage used in treatment planning: Yes National guidelines used in treatment planning: Yes Type of national guideline used in treatment planning: NCCN Staging comments: Staged on final lumpectomy specimen by Dr. Loral.  Right colon cancer Staging form: Colon and Rectum, AJCC 8th Edition - Pathologic stage from 11/01/2019: Stage IIIB (pT3, pN2a, cM0) - Signed by [name], MD on 11/29/2019 Stage prefix: Initial diagnosis Histologic grading system: 4 grade system Histologic grade (G): G2 Residual tumor (R): R0 - None Tumor deposits (TD): Absent Perineural invasion (PNI): Absent Microsatellite instability (MSI): Stable KRAS mutation: Unknown NRAS mutation: Unknown BRAF mutation: Unknown     Malignant neoplasm of female breast (HCC)  05/29/2014 Initial Biopsy   Left breast needle core biopsy: Grade 2, DCIS with calcs. ER+ (100%), PR+ (96%).    06/04/2014 Initial Diagnosis   Left breast DCIS with calcifications, ER 100%, PR 96%   06/10/2014 Breast MRI   Left breast: 2.4 x 1.3 x 1.1 cm area of patchy non-mass enhancement upper outer quadrant includes postbiopsy seroma; Right breast: 1.2 cm previously biopsied stable benign fibroadenoma   06/12/2014 Procedure   Genetic counseling/testing: Identified 1 VUS on CHEK2 gene. Remainder of 17 gene panel tested negative and included: ATM, BARD1, BRCA 1/2, BRIP1, CDH1, CHEK2, EPCAM, MLH1, MSH2, MSH6, NBN, NF1, PALB2, PTEN, RAD50, RAD51C, RAD51D, STK11, and TP53.    07/01/2014 Surgery   Left breast lumpectomy ([facility]): Grade 1, DCIS, spanning 2.3 cm, 1 mm margin, ER 100%, PR 96%   07/31/2014 - 08/28/2014 Radiation Therapy   Adjuvant RT completed Signe). Left breast: Total  dose 42.5 Gy over 17 fractions. Left breast boost: Total dose 7.5 Gy over 3 fractions.    09/14/2014 - 01/13/2020 Anti-estrogen oral therapy   Anastrazole 1mg  daily. Planned duration of treatment: 5 years ([place]). Completed in 01/2020.    09/25/2014 Survivorship   Survivorship Care Plan given to patient and reviewed with her in person.    03/02/2021 Imaging   CT CAP  IMPRESSION: 1. No findings of active/recurrent malignancy. Partial right hemicolectomy. 2. Endometrial stripe remains mildly thickened, but endometrial biopsy in May was negative for malignancy. 3. Progressive endplate sclerosis and endplate irregularity at T2-3, probably due to degenerative endplate findings. If the has referable upper thoracic pain/symptoms then thoracic spine MRI could be used for further workup. 4. Other imaging findings of potential clinical significance: Mild cardiomegaly. Aortic Atherosclerosis (ICD10-I70.0). Mild mitral valve calcification. Postoperative findings in the left breast with adjacent radiation port anteriorly in the left upper lobe. Tiny pulmonary nodules in the left lower lobe are unchanged from earliest available comparison of 10/17/2019 and probably benign although may merit surveillance. Multilevel lumbar impingement. Mild pelvic floor laxity.   Malignant neoplasm of ascending colon (HCC)  09/19/2019 Imaging   CT AP W contrast 09/19/19  IMPRESSION Fullness in the cecum, cannot exclude a mass. No evidence for metastatic disease is identified.    09/24/2019 Procedure   Colonoscopy by Dr Ernesto 09/24/19 IMPRESSION 1. The colon was redundant  2. Mild diverticulosis was noted through the entire examined colon 3. Single 12mm polyp was found in the ascending colon; polypectomy was performed using snare cautery and biopsy forceps 4. Mild diverticulosis was notes in the descending colon and sigmoid colon.  5. Single polyp was found in the sigmoid  colon, polypectomy was performed with cold  forceps.  6. Single polyp was found in the rectosigmoid colon; polypectomy was performed with cold snare  7. Small internal hemorrhoids  8. Large mass was found at the cecum; multiple biopsies of the area were performed using cold forceps; injection (tattooing) was performed distal to the mass.    09/24/2019 Initial Biopsy   INTERPRETATION AND DIAGNOSIS:  A. Cecum, biopsy:  Invasive moderately differentiated adenocarcinoma.  see comment  B. Polyp @ ascending colon, polypectomy:  Tubular Adenoma  C. Polyp @ sigmoid colon Polypectomy:  hyperplastic polyp.  D. Polyp @ rectosigmoid colon, Polypectomy:  Hyperplastic Polyp      10/16/2019 Imaging   CT Chest IMPRESSION: 1. Multiple small pulmonary nodules measuring 5 mm or less in size in the lungs. These are nonspecific and are typically considered statistically likely benign. However, given the patient's history of primary malignancy, close attention on follow-up studies is recommended to ensure stability. 2. Aortic atherosclerosis, in addition to right coronary artery disease. Assessment for potential risk factor modification, dietary therapy or pharmacologic therapy may be warranted, if clinically indicated. 3. There are calcifications of the aortic valve and mitral annulus. Echocardiographic correlation for evaluation of potential valvular dysfunction may be warranted if clinically indicated. 4. Small hiatal hernia.   Aortic Atherosclerosis (ICD10-I70.0).   11/01/2019 Initial Diagnosis   Colon cancer (HCC)   11/01/2019 Surgery   LAPAROSCOPIC PARTIAL COLECTOMY by Dr Debby and Dr Sheldon   11/01/2019 Pathology Results   FINAL MICROSCOPIC DIAGNOSIS:   A. COLON, PROXIMAL RIGHT, COLECTOMY:  - Invasive colonic adenocarcinoma, 5 cm.  - Tumor invades through the muscularis propria into pericolonic tissues.   - Margins of resection are not involved.  - Metastatic carcinoma in (5) of (13) lymph nodes.  - See oncology table.    MSI  Stable  Mismatch repair normal  MLH1 - Preserved nuclear expression (greater 50% tumor expression) MSH2 - Preserved nuclear expression (greater 50% tumor expression) MSH6 - Preserved nuclear expression (greater 50% tumor expression) PMS2 - Preserved nuclear expression (greater 50% tumor expression)   11/01/2019 Cancer Staging   Staging form: Colon and Rectum, AJCC 8th Edition - Pathologic stage from 11/01/2019: Stage IIIB (pT3, pN2a, cM0) - Signed by [name], MD on 11/29/2019   12/10/2019 Procedure   PAC placed 12/10/19   12/17/2019 - 06/01/2020 Chemotherapy   FOLFOX q2weeks starting in 2 weeks starting 12/17/19. Held 01/27/20-02/10/20 due to b/l PE. Oxaliplatin  held C11-12 due to neuropathy. Completed on 06/01/20   03/02/2021 Imaging   CT CAP  IMPRESSION: 1. No findings of active/recurrent malignancy. Partial right hemicolectomy. 2. Endometrial stripe remains mildly thickened, but endometrial biopsy in May was negative for malignancy. 3. Progressive endplate sclerosis and endplate irregularity at T2-3, probably due to degenerative endplate findings. If the has referable upper thoracic pain/symptoms then thoracic spine MRI could be used for further workup. 4. Other imaging findings of potential clinical significance: Mild cardiomegaly. Aortic Atherosclerosis (ICD10-I70.0). Mild mitral valve calcification. Postoperative findings in the left breast with adjacent radiation port anteriorly in the left upper lobe. Tiny pulmonary nodules in the left lower lobe are unchanged from earliest available comparison of 10/17/2019 and probably benign although may merit surveillance. Multilevel lumbar impingement. Mild pelvic floor laxity.   08/26/2021 Imaging   EXAM: CT CHEST, ABDOMEN, AND PELVIS WITH CONTRAST  IMPRESSION: 1. Stable examination without new or progressive findings to suggest local recurrence or metastatic disease within the chest, abdomen, or pelvis. 2. Hepatomegaly with hepatic  steatosis. 3. Sigmoid colonic diverticulosis without findings of acute diverticulitis. 4. Similar prominent endplate sclerosis and irregularity at T2-T3 is most consistent with Modic type endplate degenerative changes. However, if patient has referable upper thoracic pain consider further workup with thoracic spine MRI. 5. Similar thickening of the endometrial stripe measuring up to 8 mm, which was previously biopsied with results negative for malignancy. 6. Aortic Atherosclerosis (ICD10-I70.0).   11/10/2021 PET scan   IMPRESSION: 1. LEFT ovary is increased in size and is intensely hypermetabolic. While physiologic hypermetabolic ovarian tissue is not uncommon, the enlargement and asymmetric activity warrants further evaluation. Consider contrast pelvic MRI vs tissue sampling. 2. No evidence of metastatic colorectal carcinoma otherwise. 3. Post RIGHT hemicolectomy anatomy. 4. Evidence of radiation change in the LEFT upper lobe (remote breast cancer).     12/22/2021 Relapse/Recurrence    FINAL MICROSCOPIC DIAGNOSIS:   A. LEFT OVARY AND FALLOPIAN TUBE, SALPINGO OOPHORECTOMY:  - Metastatic moderately differentiated colonic adenocarcinoma involving left ovary  - Focal ovarian stromal calcification  - Segment of benign fallopian tube   B. UTERUS WITH RIGHT FALLOPIAN TUBE AND OVARY, HYSTERECTOMY AND SALPINGO-OOPHORECTOMY:  - Metastatic moderately differentiated colonic adenocarcinoma involving right ovary  - Focal invasive extensive adenomyosis  - Benign endometrial polyps  - Benign proliferative phase endometrium  - Hydrosalpinx of right fallopian tube  - Focal ovarian stromal calcification   C. PERITONEAL DEPOSITS, ANTERIOR CUL DE SAC, BIOPSY:  - Metastatic mucinous adenocarcinoma, consistent with colorectal primary    COMMENT:  Immunohistochemical stains show that the tumor cells are positive for CK20 and CDX2 while they are negative for CK7 and PAX8, consistent with above  interpretation.    01/24/2022 - 03/23/2022 Chemotherapy   Patient is on Treatment Plan : COLORECTAL FOLFOX q14d x 3 months     04/01/2022 Imaging   CT AP IMPRESSION: 1. New omental metastatic disease. 2. Vague hypoattenuating lesion in the inferior right hepatic lobe is new and also worrisome for metastatic disease. 3. Similar small right lower lobe nodules. Recommend continued attention on follow-up. 4. Steatotic enlarged liver. 5.  Aortic atherosclerosis (ICD10-I70.0).   04/06/2022 -  Chemotherapy   Patient is on Treatment Plan : COLORECTAL FOLFIRI + Bevacizumab  q14d     04/21/2022 Imaging    IMPRESSION: 1. Stable chest CT. No evidence of metastatic disease. 2. Stable small pulmonary nodules, considered benign based on stability. 3. Stable postsurgical changes in the left breast and anterior left upper lobe radiation changes. 4. Aortic Atherosclerosis (ICD10-I70.0) and Emphysema (ICD10-J43.9).   04/21/2022 Imaging    IMPRESSION: 1. Small enhancing lesion inferiorly in the right hepatic lobe is typical of metastatic disease and stable from recent abdominal CT. No other definite liver lesions are identified. Mild hepatic steatosis. 2. No other significant abdominal findings. 3. Omental nodularity seen on CT is not well visualized by MRI.    Imaging     06/23/2022 Imaging    IMPRESSION: 1. Hypodense lesion of the inferior right lobe of the liver, hepatic segment VI is diminished in size, consistent with treatment response. 2. Tiny peritoneal and omental nodules identified by prior examination are diminished in size, consistent with treatment response. 3. Multiple tiny pulmonary nodules unchanged, most likely benign and incidental, however continued attention on follow-up warranted in the setting of known metastatic disease. 4. No evidence of new metastatic disease in the chest, abdomen, or pelvis. 5. Status post partial right hemicolectomy and reanastomosis. 6. Diffuse  mosaic attenuation of the airspaces, consistent with small airways disease. 7.  Hepatomegaly.      Discussed the use of AI scribe software for clinical note transcription with the patient, who gave verbal consent to proceed.  History of Present Illness   The patient, a 78 year old with metastatic colon cancer, presents with increasing tingling in her fingers and sensitivity to heat. She reports that her skin is becoming dry and layers are peeling off, making her fingers more sensitive. She used to be able to tolerate hot water  and hot dishes, but now finds these to be painful. She has been using lotion to manage the dryness. She has been on chemotherapy, including oxaliplatin  in the past, which is known to cause neuropathy. She has been trying to manage the neuropathy with exercise and B complex vitamins.  In addition to the neuropathy, she reports intermittent diarrhea, which she manages with Imodium. Her weight has remained stable. She is also on pregabalin  25mg , which she still has refills for. She has an upcoming scan scheduled for the end of February to monitor her cancer.         All other systems were reviewed with the patient and are negative.  MEDICAL HISTORY:  Past Medical History:  Diagnosis Date   Aortic atherosclerosis (HCC)    Arthritis    feet, lower back   Basal cell carcinoma    arm   Breast cancer of upper-outer quadrant of left female breast (HCC) 06/04/2014   Cataract    immature on the left   Colon cancer (HCC) 08/2019   Diabetes mellitus without complication (HCC)    Diverticulosis    Dizziness    > 30yrs ago;took Antivert    Family history of anesthesia complication    sister slow to wake up with anesthesia   Family history of breast cancer    Family history of colon cancer    Family history of uterine cancer    GERD (gastroesophageal reflux disease)    takes occasional TUMs   History of bronchitis    > 31yrs ago   History of colon polyps    History  of hiatal hernia    Small noted on CT   History of pulmonary embolus (PE)    Hypertension    takes Losartan  daily and HCTZ   Iron deficiency anemia    Joint pain    Numbness    to toes on each foot   Peripheral neuropathy    feet and toes   Personal history of radiation therapy    Pulmonary nodules    Noted on CT   Radiation 07/31/14-08/28/14   Left Breast 20 fxs   Seasonal allergies    takes Claritin prn   Urinary frequency    Vitamin D deficiency    takes VIt D daily    SURGICAL HISTORY: Past Surgical History:  Procedure Laterality Date   BREAST BIOPSY Bilateral    BREAST LUMPECTOMY Left    BREAST LUMPECTOMY WITH RADIOACTIVE SEED LOCALIZATION Left 07/01/2014   Procedure: LEFT BREAST LUMPECTOMY WITH RADIOACTIVE SEED LOCALIZATION;  Surgeon: Morene Olives, MD;  Location: [facility];  Service: General;  Laterality: Left;   CATARACT EXTRACTION Right    COLONOSCOPY  09/24/2019   [place]   LAPAROSCOPIC PARTIAL COLECTOMY N/A 11/01/2019   Procedure: LAPAROSCOPIC PARTIAL COLECTOMY;  Surgeon: [name], MD;  Location: [facility];  Service: General;  Laterality: N/A;   POLYPECTOMY     PORTACATH PLACEMENT N/A 12/10/2019   Procedure: INSERTION PORT-A-CATH ULTRASOUND GUIDED IN RIGHT IJ;  Surgeon: [name], MD;  Location: [facility];  Service: General;  Laterality: N/A;   ROBOTIC ASSISTED TOTAL HYSTERECTOMY Bilateral 12/22/2021   Procedure: XI ROBOTIC ASSISTED TOTAL HYSTERECTOMY WITH BILATERAL SALPINGO OOPHORECTOMY;  Surgeon: Viktoria Comer SAUNDERS, MD;  Location: [facility];  Service: Gynecology;  Laterality: Bilateral;   TOTAL KNEE ARTHROPLASTY Left 10/24/2012   Procedure: TOTAL KNEE ARTHROPLASTY;  Surgeon: Dempsey JINNY Sensor, MD;  Location: [facility];  Service: Orthopedics;  Laterality: Left;   TOTAL KNEE ARTHROPLASTY Right 01/09/2013   Procedure: TOTAL KNEE ARTHROPLASTY;  Surgeon: Dempsey JINNY Sensor, MD;  Location: [facility];  Service: Orthopedics;  Laterality: Right;   TUBAL LIGATION       I have reviewed the social history and family history with the patient and they are unchanged from previous note.  ALLERGIES:  is allergic to oxycodone .  MEDICATIONS:  Current Outpatient Medications  Medication Sig Dispense Refill   apixaban  (ELIQUIS ) 5 MG TABS tablet Take 1 tablet (5 mg total) by mouth 2 (two) times daily. 60 tablet 5   b complex vitamins capsule Take 1 capsule by mouth daily.     Cholecalciferol (VITAMIN D) 2000 UNITS CAPS Take 2,000 Units by mouth daily.      ibuprofen (ADVIL) 200 MG tablet Take 200 mg by mouth daily as needed (pain).     lidocaine -prilocaine  (EMLA ) cream Apply 1 Application topically as needed. 30 g 0   loratadine (CLARITIN) 10 MG tablet Take 10 mg by mouth daily as needed for allergies.     losartan  (COZAAR ) 25 MG tablet Take 1 tablet (25 mg total) by mouth daily. 90 tablet 3   metFORMIN  (GLUCOPHAGE ) 500 MG tablet Take 1 tablet (500 mg total) by mouth in the morning and at bedtime. 180 tablet 3   Multiple Vitamins-Minerals (MULTIVITAMIN WITH MINERALS) tablet Take 1 tablet by mouth daily.     omeprazole (PRILOSEC) 20 MG capsule Take 20 mg by mouth daily before breakfast.      ondansetron  (ZOFRAN ) 8 MG tablet Take 1 tablet (8 mg total) by mouth every 8 (eight) hours as needed for nausea or vomiting. Start on the third day after chemotherapy. 30 tablet 1   potassium chloride  (KLOR-CON ) 10 MEQ tablet Take 1 tablet (10 mEq total) by mouth 2 (two) times daily. 60 tablet 1   pravastatin  (PRAVACHOL ) 10 MG tablet Take 1 tablet (10 mg total) by mouth daily. 90 tablet 3   pregabalin  (LYRICA ) 25 MG capsule Take 1 capsule (25 mg total) by mouth every morning. Take in addition to 50 mg at bedtime 60 capsule 2   pregabalin  (LYRICA ) 50 MG capsule Take 1 capsule (50 mg total) by mouth at bedtime. Take in addition to 25 mg in the morning 60 capsule 2   prochlorperazine  (COMPAZINE ) 10 MG tablet Take 1 tablet (10 mg total) by mouth every 6 (six) hours as needed for  nausea or vomiting. 30 tablet 1   triamcinolone  ointment (KENALOG ) 0.5 % Apply 1 Application topically 2 (two) times daily. 30 g 0   No current facility-administered medications for this visit.    PHYSICAL EXAMINATION: ECOG PERFORMANCE STATUS: 1 - Symptomatic but completely ambulatory  Vitals:   05/15/23 0840  BP: 130/64  Pulse: 70  Resp: 15  Temp: 97.6 F (36.4 C)  SpO2: 100%   Wt Readings from Last 3 Encounters:  05/15/23 213 lb 11.2 oz (96.9 kg)  05/02/23 213 lb 4.8 oz (96.8 kg)  04/10/23 214 lb (97.1 kg)     GENERAL:alert, no distress and comfortable SKIN:  skin color, texture, turgor are normal, no rashes or significant lesions EYES: normal, Conjunctiva are pink and non-injected, sclera clear NECK: supple, thyroid normal size, non-tender, without nodularity LYMPH:  no palpable lymphadenopathy in the cervical, axillary  LUNGS: clear to auscultation and percussion with normal breathing effort HEART: regular rate & rhythm and no murmurs and no lower extremity edema ABDOMEN:abdomen soft, non-tender and normal bowel sounds Musculoskeletal:no cyanosis of digits and no clubbing  NEURO: alert & oriented x 3 with fluent speech, no focal motor/sensory deficits  LABORATORY DATA:  I have reviewed the data as listed    Latest Ref Rng & Units 05/15/2023    8:19 AM 05/02/2023    8:05 AM 04/10/2023    7:48 AM  CBC  WBC 4.0 - 10.5 K/uL 7.6  11.3  10.4   Hemoglobin 12.0 - 15.0 g/dL 89.2  88.9  89.0   Hematocrit 36.0 - 46.0 % 33.1  34.9  35.2   Platelets 150 - 400 K/uL 203  220  217         Latest Ref Rng & Units 05/02/2023    8:05 AM 04/10/2023    7:48 AM 03/27/2023    8:19 AM  CMP  Glucose 70 - 99 mg/dL 867  889  889   BUN 8 - 23 mg/dL 23  15  14    Creatinine 0.44 - 1.00 mg/dL 9.21  9.15  9.19   Sodium 135 - 145 mmol/L 142  143  143   Potassium 3.5 - 5.1 mmol/L 4.5  3.6  3.5   Chloride 98 - 111 mmol/L 110  109  111   CO2 22 - 32 mmol/L 25  26  25    Calcium  8.9 - 10.3  mg/dL 9.2  9.2  9.0   Total Protein 6.5 - 8.1 g/dL 6.4  6.2  6.3   Total Bilirubin 0.0 - 1.2 mg/dL 0.6  0.7  0.7   Alkaline Phos 38 - 126 U/L 135  110  138   AST 15 - 41 U/L 30  20  22    ALT 0 - 44 U/L 22  15  18        RADIOGRAPHIC STUDIES: I have personally reviewed the radiological images as listed and agreed with the findings in the report. No results found.    Orders Placed This Encounter  Procedures   CT CHEST ABDOMEN PELVIS W CONTRAST    Standing Status:   Future    Expected Date:   06/19/2023    Expiration Date:   05/14/2024    If indicated for the ordered procedure, I authorize the administration of contrast media per Radiology protocol:   Yes    Does the patient have a contrast media/X-ray dye allergy?:   No    Preferred imaging location?:   [facility]    If indicated for the ordered procedure, I authorize the administration of oral contrast media per Radiology protocol:   Yes   CBC with Differential ([facility] Only)    Standing Status:   Future    Expected Date:   06/26/2023    Expiration Date:   06/25/2024   CMP ([facility] Center only)    Standing Status:   Future    Expected Date:   06/26/2023    Expiration Date:   06/25/2024   CBC with Differential ([facility] Only)    Standing Status:   Future    Expected Date:   07/10/2023    Expiration Date:  07/09/2024   CMP ([facility] Center only)    Standing Status:   Future    Expected Date:   07/10/2023    Expiration Date:   07/09/2024   All questions were answered. The patient knows to call the clinic with any problems, questions or concerns. No barriers to learning was detected. The total time spent in the appointment was 25 minutes.     Onita Mattock, MD 05/15/2023

## 2023-05-17 ENCOUNTER — Inpatient Hospital Stay: Payer: Medicare Other

## 2023-05-17 VITALS — BP 131/48 | HR 62 | Temp 98.2°F | Resp 20

## 2023-05-17 DIAGNOSIS — Z90722 Acquired absence of ovaries, bilateral: Secondary | ICD-10-CM | POA: Diagnosis not present

## 2023-05-17 DIAGNOSIS — Z803 Family history of malignant neoplasm of breast: Secondary | ICD-10-CM | POA: Diagnosis not present

## 2023-05-17 DIAGNOSIS — G620 Drug-induced polyneuropathy: Secondary | ICD-10-CM | POA: Diagnosis not present

## 2023-05-17 DIAGNOSIS — C182 Malignant neoplasm of ascending colon: Secondary | ICD-10-CM

## 2023-05-17 DIAGNOSIS — Z90710 Acquired absence of both cervix and uterus: Secondary | ICD-10-CM | POA: Diagnosis not present

## 2023-05-17 DIAGNOSIS — C786 Secondary malignant neoplasm of retroperitoneum and peritoneum: Secondary | ICD-10-CM | POA: Diagnosis not present

## 2023-05-17 DIAGNOSIS — Z853 Personal history of malignant neoplasm of breast: Secondary | ICD-10-CM | POA: Diagnosis not present

## 2023-05-17 DIAGNOSIS — Z8049 Family history of malignant neoplasm of other genital organs: Secondary | ICD-10-CM | POA: Diagnosis not present

## 2023-05-17 DIAGNOSIS — C7963 Secondary malignant neoplasm of bilateral ovaries: Secondary | ICD-10-CM | POA: Diagnosis not present

## 2023-05-17 DIAGNOSIS — Z5111 Encounter for antineoplastic chemotherapy: Secondary | ICD-10-CM | POA: Diagnosis not present

## 2023-05-17 DIAGNOSIS — C787 Secondary malignant neoplasm of liver and intrahepatic bile duct: Secondary | ICD-10-CM | POA: Diagnosis not present

## 2023-05-17 DIAGNOSIS — Z800 Family history of malignant neoplasm of digestive organs: Secondary | ICD-10-CM | POA: Diagnosis not present

## 2023-05-17 DIAGNOSIS — Z5189 Encounter for other specified aftercare: Secondary | ICD-10-CM | POA: Diagnosis not present

## 2023-05-17 MED ORDER — SODIUM CHLORIDE 0.9% FLUSH
10.0000 mL | INTRAVENOUS | Status: DC | PRN
  Administered 2023-05-17: 10 mL

## 2023-05-17 MED ORDER — HEPARIN SOD (PORK) LOCK FLUSH 100 UNIT/ML IV SOLN
500.0000 [IU] | Freq: Once | INTRAVENOUS | Status: AC | PRN
  Administered 2023-05-17: 500 [IU]

## 2023-05-17 MED ORDER — PEGFILGRASTIM INJECTION 6 MG/0.6ML ~~LOC~~
6.0000 mg | PREFILLED_SYRINGE | Freq: Once | SUBCUTANEOUS | Status: AC
  Administered 2023-05-17: 6 mg via SUBCUTANEOUS
  Filled 2023-05-17: qty 0.6

## 2023-05-21 ENCOUNTER — Other Ambulatory Visit: Payer: Self-pay

## 2023-05-24 ENCOUNTER — Other Ambulatory Visit: Payer: Self-pay | Admitting: Pharmacist

## 2023-05-24 ENCOUNTER — Encounter: Payer: Self-pay | Admitting: Hematology

## 2023-05-26 ENCOUNTER — Ambulatory Visit: Payer: Medicare Other | Admitting: Family Medicine

## 2023-05-28 NOTE — Assessment & Plan Note (Signed)
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 Path showed overall Stage IIIB cancer.  -s/p 6 months adjuvant FOLFOX, completed 06/01/20, oxaliplatin held for final 2 cycles due to neuropathy.  -s/p BSO and hysterectomy on 12/22/21 with Dr. Pricilla Holm. Path revealed metastatic moderately differentiated colonic adenocarcinoma involving both ovaries and peritoneum -she restarted FOLFOX on 01/24/22. -Due to disease progression, chemotherapy changed to second line FOLFIRI and bevacizumab on April 06, 2022. She has been tolerating moderately well, better now with dose reduction, will continue -Restaging CT scan from 09/27/2022 showed continous response in liver and peritoneal metastasis, no other new lesions.   -We discussed maintenance therapy option in future  -due to her PE in 10/2022, will hold beva for 3 months  -restaging CT 12/31/2022 showed stable disease and live met is not well defined on CT. She restarted beva on 01/17/23  -restaging CT 03/23/2023 showed overall stable disease (stable small lung nodules, no other evidence of cancer)

## 2023-05-29 ENCOUNTER — Inpatient Hospital Stay: Payer: Medicare Other

## 2023-05-29 ENCOUNTER — Encounter: Payer: Self-pay | Admitting: Hematology

## 2023-05-29 ENCOUNTER — Inpatient Hospital Stay: Payer: Medicare Other | Admitting: Hematology

## 2023-05-29 VITALS — BP 140/74 | HR 67 | Temp 97.6°F | Resp 16 | Wt 212.6 lb

## 2023-05-29 DIAGNOSIS — Z90722 Acquired absence of ovaries, bilateral: Secondary | ICD-10-CM | POA: Diagnosis not present

## 2023-05-29 DIAGNOSIS — Z5189 Encounter for other specified aftercare: Secondary | ICD-10-CM | POA: Diagnosis not present

## 2023-05-29 DIAGNOSIS — Z803 Family history of malignant neoplasm of breast: Secondary | ICD-10-CM | POA: Diagnosis not present

## 2023-05-29 DIAGNOSIS — Z853 Personal history of malignant neoplasm of breast: Secondary | ICD-10-CM | POA: Diagnosis not present

## 2023-05-29 DIAGNOSIS — Z800 Family history of malignant neoplasm of digestive organs: Secondary | ICD-10-CM | POA: Diagnosis not present

## 2023-05-29 DIAGNOSIS — C7963 Secondary malignant neoplasm of bilateral ovaries: Secondary | ICD-10-CM | POA: Diagnosis not present

## 2023-05-29 DIAGNOSIS — C182 Malignant neoplasm of ascending colon: Secondary | ICD-10-CM

## 2023-05-29 DIAGNOSIS — Z8049 Family history of malignant neoplasm of other genital organs: Secondary | ICD-10-CM | POA: Diagnosis not present

## 2023-05-29 DIAGNOSIS — G620 Drug-induced polyneuropathy: Secondary | ICD-10-CM | POA: Diagnosis not present

## 2023-05-29 DIAGNOSIS — C787 Secondary malignant neoplasm of liver and intrahepatic bile duct: Secondary | ICD-10-CM | POA: Diagnosis not present

## 2023-05-29 DIAGNOSIS — Z90710 Acquired absence of both cervix and uterus: Secondary | ICD-10-CM | POA: Diagnosis not present

## 2023-05-29 DIAGNOSIS — Z95828 Presence of other vascular implants and grafts: Secondary | ICD-10-CM

## 2023-05-29 DIAGNOSIS — Z5111 Encounter for antineoplastic chemotherapy: Secondary | ICD-10-CM | POA: Diagnosis not present

## 2023-05-29 DIAGNOSIS — C786 Secondary malignant neoplasm of retroperitoneum and peritoneum: Secondary | ICD-10-CM | POA: Diagnosis not present

## 2023-05-29 LAB — CBC WITH DIFFERENTIAL (CANCER CENTER ONLY)
Abs Immature Granulocytes: 0.45 10*3/uL — ABNORMAL HIGH (ref 0.00–0.07)
Basophils Absolute: 0.1 10*3/uL (ref 0.0–0.1)
Basophils Relative: 1 %
Eosinophils Absolute: 0.4 10*3/uL (ref 0.0–0.5)
Eosinophils Relative: 2 %
HCT: 34.5 % — ABNORMAL LOW (ref 36.0–46.0)
Hemoglobin: 10.8 g/dL — ABNORMAL LOW (ref 12.0–15.0)
Immature Granulocytes: 3 %
Lymphocytes Relative: 22 %
Lymphs Abs: 3.3 10*3/uL (ref 0.7–4.0)
MCH: 32.2 pg (ref 26.0–34.0)
MCHC: 31.3 g/dL (ref 30.0–36.0)
MCV: 103 fL — ABNORMAL HIGH (ref 80.0–100.0)
Monocytes Absolute: 0.9 10*3/uL (ref 0.1–1.0)
Monocytes Relative: 6 %
Neutro Abs: 10 10*3/uL — ABNORMAL HIGH (ref 1.7–7.7)
Neutrophils Relative %: 66 %
Platelet Count: 242 10*3/uL (ref 150–400)
RBC: 3.35 MIL/uL — ABNORMAL LOW (ref 3.87–5.11)
RDW: 15.7 % — ABNORMAL HIGH (ref 11.5–15.5)
WBC Count: 15.1 10*3/uL — ABNORMAL HIGH (ref 4.0–10.5)
nRBC: 0 % (ref 0.0–0.2)

## 2023-05-29 LAB — CMP (CANCER CENTER ONLY)
ALT: 41 U/L (ref 0–44)
AST: 45 U/L — ABNORMAL HIGH (ref 15–41)
Albumin: 3.8 g/dL (ref 3.5–5.0)
Alkaline Phosphatase: 180 U/L — ABNORMAL HIGH (ref 38–126)
Anion gap: 7 (ref 5–15)
BUN: 17 mg/dL (ref 8–23)
CO2: 26 mmol/L (ref 22–32)
Calcium: 9.4 mg/dL (ref 8.9–10.3)
Chloride: 107 mmol/L (ref 98–111)
Creatinine: 0.8 mg/dL (ref 0.44–1.00)
GFR, Estimated: >60 mL/min (ref >60)
Glucose, Bld: 117 mg/dL — ABNORMAL HIGH (ref 70–99)
Potassium: 4.5 mmol/L (ref 3.5–5.1)
Sodium: 140 mmol/L (ref 135–145)
Total Bilirubin: 0.7 mg/dL (ref 0.0–1.2)
Total Protein: 6.6 g/dL (ref 6.5–8.1)

## 2023-05-29 LAB — CEA (ACCESS): CEA (CHCC): 8.84 ng/mL — ABNORMAL HIGH (ref 0.00–5.00)

## 2023-05-29 MED ORDER — SODIUM CHLORIDE 0.9 % IV SOLN
2400.0000 mg/m2 | INTRAVENOUS | Status: DC
  Administered 2023-05-29: 5000 mg via INTRAVENOUS
  Filled 2023-05-29: qty 100

## 2023-05-29 MED ORDER — SODIUM CHLORIDE 0.9 % IV SOLN
5.0000 mg/kg | Freq: Once | INTRAVENOUS | Status: AC
  Administered 2023-05-29: 500 mg via INTRAVENOUS
  Filled 2023-05-29: qty 4

## 2023-05-29 MED ORDER — ATROPINE SULFATE 1 MG/ML IV SOLN
0.5000 mg | Freq: Once | INTRAVENOUS | Status: AC | PRN
  Administered 2023-05-29: 0.5 mg via INTRAVENOUS
  Filled 2023-05-29: qty 1

## 2023-05-29 MED ORDER — SODIUM CHLORIDE 0.9 % IV SOLN
400.0000 mg/m2 | Freq: Once | INTRAVENOUS | Status: AC
  Administered 2023-05-29: 896 mg via INTRAVENOUS
  Filled 2023-05-29: qty 44.8

## 2023-05-29 MED ORDER — SODIUM CHLORIDE 0.9 % IV SOLN: Freq: Once | INTRAVENOUS | Status: AC

## 2023-05-29 MED ORDER — PALONOSETRON HCL INJECTION 0.25 MG/5ML
0.2500 mg | Freq: Once | INTRAVENOUS | Status: AC
  Administered 2023-05-29: 0.25 mg via INTRAVENOUS
  Filled 2023-05-29: qty 5

## 2023-05-29 MED ORDER — SODIUM CHLORIDE 0.9% FLUSH
10.0000 mL | INTRAVENOUS | Status: DC | PRN
  Administered 2023-05-29: 10 mL

## 2023-05-29 MED ORDER — DEXAMETHASONE SODIUM PHOSPHATE 10 MG/ML IJ SOLN
10.0000 mg | Freq: Once | INTRAMUSCULAR | Status: AC
Start: 2023-05-29 — End: 2023-05-29
  Administered 2023-05-29: 10 mg via INTRAVENOUS
  Filled 2023-05-29: qty 1

## 2023-05-29 MED ORDER — SODIUM CHLORIDE 0.9 % IV SOLN
100.0000 mg/m2 | Freq: Once | INTRAVENOUS | Status: AC
  Administered 2023-05-29: 220 mg via INTRAVENOUS
  Filled 2023-05-29: qty 11

## 2023-05-29 NOTE — Progress Notes (Signed)
[facility]   [phone] [phone]   Clinic Follow up Note   Patient Care Team: [name], MD as PCP - General (Family Medicine) [name], MD as Consulting Physician (General Surgery) [name], MD as Consulting Physician (Hematology) [name], MD as Consulting Physician (General Surgery) [name], MD as Consulting Physician (Hematology and Oncology) [facility], [facility]  Date of Service:  05/29/2023  CHIEF COMPLAINT: f/u of colon cancer  CURRENT THERAPY:  Chemotherapy FOLFIRI and bevacizumab  Oncology History   Malignant neoplasm of ascending colon (HCC) pT3N2aM0 stage IIB, MSS, metastatic to b/l ovaries and peritoneum and liver, KRAS mutation G12V (+)  -Initially diagnosed in 08/2019, s/p resection with Dr Maisie Fus on 11/01/19. Path showed overall Stage IIIB cancer.  -s/p 6 months adjuvant FOLFOX, completed 06/01/20, oxaliplatin held for final 2 cycles due to neuropathy.  -s/p BSO and hysterectomy on 12/22/21 with Dr. Pricilla Holm. Path revealed metastatic moderately differentiated colonic adenocarcinoma involving both ovaries and peritoneum -she restarted FOLFOX on 01/24/22. -Due to disease progression, chemotherapy changed to second line FOLFIRI and bevacizumab on April 06, 2022. She has been tolerating moderately well, better now with dose reduction, will continue -Restaging CT scan from 09/27/2022 showed continous response in liver and peritoneal metastasis, no other new lesions.   -We discussed maintenance therapy option in future  -due to her PE in 10/2022, will hold beva for 3 months  -restaging CT 12/31/2022 showed stable disease and live met is not well defined on CT. She restarted beva on 01/17/23  -restaging CT 03/23/2023 showed overall stable disease (stable small lung nodules, no other evidence of cancer)   Assessment and Plan    Metastatic Colon Cancer Follow-up for metastatic colon cancer.  Reports no new symptoms. Last scan in November; next scan due in February. Tumor markers fluctuating but overall stable. Prefers earlier scan date for quicker results. Anticipated stable scan outcome. No allergies to contrast. - Order CT scan for February 17 - Schedule next treatment for February 10 - Monitor tumor markers  Leukocytosis White count slightly elevated, temperature normal. Likely result of growth factor administered on January 15. Monitors temperature regularly with no fever reported. - Monitor white blood cell count  Oral Mucositis Reports intermittent sore throat and mouth sores. Using Oragel for relief. Inquired about non-alcoholic mouthwash and mentioned sensitivity to various toothpastes. Discussed homemade mouthwash with warm water and baking soda. Consider magical mouthwash with lidocaine if symptoms worsen. - Prescribe non-alcoholic mouthwash - Recommend sensitive toothpaste - Advise on homemade mouthwash with warm water and baking soda - Consider prescribing magical mouthwash with lidocaine if symptoms worsen  Plan -Lab reviewed, adequate for treatment, will proceed chemo today - Schedule follow-up CT scan for February 17 -Her next treatment is on February 10       SUMMARY OF ONCOLOGIC HISTORY: Oncology History Overview Note   Cancer Staging  Malignant neoplasm of female breast [facility]) Staging form: Breast, AJCC 7th Edition - Clinical stage from 06/11/2014: Stage 0 (Tis (DCIS), N0, M0) - Unsigned Staged by: Pathologist and managing physician Laterality: Left Estrogen receptor status: Positive Progesterone receptor status: Positive Stage used in treatment planning: Yes National guidelines used in treatment planning: Yes Type of national guideline used in treatment planning: NCCN - Pathologic stage from 07/03/2014: Stage Unknown (Tis (DCIS), NX, cM0) - Signed by [name], MD on 07/10/2014 Staged by: Pathologist Laterality: Left Estrogen receptor status:  Positive Progesterone receptor status: Positive Stage used in treatment planning:  Yes National guidelines used in treatment planning: Yes Type of national guideline used in treatment planning: NCCN Staging comments: Staged on final lumpectomy specimen by Dr. [name].  Right colon cancer Staging form: Colon and Rectum, AJCC 8th Edition - Pathologic stage from 11/01/2019: Stage IIIB (pT3, pN2a, cM0) - Signed by [name], MD on 11/29/2019 Stage prefix: Initial diagnosis Histologic grading system: 4 grade system Histologic grade (G): G2 Residual tumor (R): R0 - None Tumor deposits (TD): Absent Perineural invasion (PNI): Absent Microsatellite instability (MSI): Stable KRAS mutation: Unknown NRAS mutation: Unknown BRAF mutation: Unknown     Malignant neoplasm of female breast (HCC)  05/29/2014 Initial Biopsy   Left breast needle core biopsy: Grade 2, DCIS with calcs. ER+ (100%), PR+ (96%).    06/04/2014 Initial Diagnosis   Left breast DCIS with calcifications, ER 100%, PR 96%   06/10/2014 Breast MRI   Left breast: 2.4 x 1.3 x 1.1 cm area of patchy non-mass enhancement upper outer quadrant includes postbiopsy seroma; Right breast: 1.2 cm previously biopsied stable benign fibroadenoma   06/12/2014 Procedure   Genetic counseling/testing: Identified 1 VUS on CHEK2 gene. Remainder of 17 gene panel tested negative and included: ATM, BARD1, BRCA 1/2, BRIP1, CDH1, CHEK2, EPCAM, MLH1, MSH2, MSH6, NBN, NF1, PALB2, PTEN, RAD50, RAD51C, RAD51D, STK11, and TP53.    07/01/2014 Surgery   Left breast lumpectomy ([facility]): Grade 1, DCIS, spanning 2.3 cm, 1 mm margin, ER 100%, PR 96%   07/31/2014 - 08/28/2014 Radiation Therapy   Adjuvant RT completed [name]). Left breast: Total dose 42.5 Gy over 17 fractions. Left breast boost: Total dose 7.5 Gy over 3 fractions.    09/14/2014 - 01/13/2020 Anti-estrogen oral therapy   Anastrazole 1mg  daily. Planned duration of treatment: 5 years [place]). Completed in 01/2020.     09/25/2014 Survivorship   Survivorship Care Plan given to patient and reviewed with her in person.    03/02/2021 Imaging   CT CAP  IMPRESSION: 1. No findings of active/recurrent malignancy. Partial right hemicolectomy. 2. Endometrial stripe remains mildly thickened, but endometrial biopsy in May was negative for malignancy. 3. Progressive endplate sclerosis and endplate irregularity at T2-3, probably due to degenerative endplate findings. If the has referable upper thoracic pain/symptoms then thoracic spine MRI could be used for further workup. 4. Other imaging findings of potential clinical significance: Mild cardiomegaly. Aortic Atherosclerosis (ICD10-I70.0). Mild mitral valve calcification. Postoperative findings in the left breast with adjacent radiation port anteriorly in the left upper lobe. Tiny pulmonary nodules in the left lower lobe are unchanged from earliest available comparison of 10/17/2019 and probably benign although may merit surveillance. Multilevel lumbar impingement. Mild pelvic floor laxity.   Malignant neoplasm of ascending colon (HCC)  09/19/2019 Imaging   CT AP W contrast 09/19/19  IMPRESSION Fullness in the cecum, cannot exclude a mass. No evidence for metastatic disease is identified.    09/24/2019 Procedure   Colonoscopy by Dr [name] 09/24/19 IMPRESSION 1. The colon was redundant  2. Mild diverticulosis was noted through the entire examined colon 3. Single 12mm polyp was found in the ascending colon; polypectomy was performed using snare cautery and biopsy forceps 4. Mild diverticulosis was notes in the descending colon and sigmoid colon.  5. Single polyp was found in the sigmoid colon, polypectomy was performed with cold forceps.  6. Single polyp was found in the rectosigmoid colon; polypectomy was performed with cold snare  7. Small internal hemorrhoids  8. Large mass was found at the cecum; multiple biopsies of the area  were performed using cold  forceps; injection (tattooing) was performed distal to the mass.    09/24/2019 Initial Biopsy   INTERPRETATION AND DIAGNOSIS:  A. Cecum, biopsy:  Invasive moderately differentiated adenocarcinoma.  see comment  B. Polyp @ ascending colon, polypectomy:  Tubular Adenoma  C. Polyp @ sigmoid colon Polypectomy:  hyperplastic polyp.  D. Polyp @ rectosigmoid colon, Polypectomy:  Hyperplastic Polyp      10/16/2019 Imaging   CT Chest IMPRESSION: 1. Multiple small pulmonary nodules measuring 5 mm or less in size in the lungs. These are nonspecific and are typically considered statistically likely benign. However, given the patient's history of primary malignancy, close attention on follow-up studies is recommended to ensure stability. 2. Aortic atherosclerosis, in addition to right coronary artery disease. Assessment for potential risk factor modification, dietary therapy or pharmacologic therapy may be warranted, if clinically indicated. 3. There are calcifications of the aortic valve and mitral annulus. Echocardiographic correlation for evaluation of potential valvular dysfunction may be warranted if clinically indicated. 4. Small hiatal hernia.   Aortic Atherosclerosis (ICD10-I70.0).   11/01/2019 Initial Diagnosis   Colon cancer (HCC)   11/01/2019 Surgery   LAPAROSCOPIC PARTIAL COLECTOMY by Dr Maisie Fus and Dr Michaell Cowing   11/01/2019 Pathology Results   FINAL MICROSCOPIC DIAGNOSIS:   A. COLON, PROXIMAL RIGHT, COLECTOMY:  - Invasive colonic adenocarcinoma, 5 cm.  - Tumor invades through the muscularis propria into pericolonic tissues.   - Margins of resection are not involved.  - Metastatic carcinoma in (5) of (13) lymph nodes.  - See oncology table.    MSI Stable  Mismatch repair normal  MLH1 - Preserved nuclear expression (greater 50% tumor expression) MSH2 - Preserved nuclear expression (greater 50% tumor expression) MSH6 - Preserved nuclear expression (greater 50% tumor  expression) PMS2 - Preserved nuclear expression (greater 50% tumor expression)   11/01/2019 Cancer Staging   Staging form: Colon and Rectum, AJCC 8th Edition - Pathologic stage from 11/01/2019: Stage IIIB (pT3, pN2a, cM0) - Signed by [name], MD on 11/29/2019   12/10/2019 Procedure   PAC placed 12/10/19   12/17/2019 - 06/01/2020 Chemotherapy   FOLFOX q2weeks starting in 2 weeks starting 12/17/19. Held 01/27/20-02/10/20 due to b/l PE. Oxaliplatin held C11-12 due to neuropathy. Completed on 06/01/20   03/02/2021 Imaging   CT CAP  IMPRESSION: 1. No findings of active/recurrent malignancy. Partial right hemicolectomy. 2. Endometrial stripe remains mildly thickened, but endometrial biopsy in May was negative for malignancy. 3. Progressive endplate sclerosis and endplate irregularity at T2-3, probably due to degenerative endplate findings. If the has referable upper thoracic pain/symptoms then thoracic spine MRI could be used for further workup. 4. Other imaging findings of potential clinical significance: Mild cardiomegaly. Aortic Atherosclerosis (ICD10-I70.0). Mild mitral valve calcification. Postoperative findings in the left breast with adjacent radiation port anteriorly in the left upper lobe. Tiny pulmonary nodules in the left lower lobe are unchanged from earliest available comparison of 10/17/2019 and probably benign although may merit surveillance. Multilevel lumbar impingement. Mild pelvic floor laxity.   08/26/2021 Imaging   EXAM: CT CHEST, ABDOMEN, AND PELVIS WITH CONTRAST  IMPRESSION: 1. Stable examination without new or progressive findings to suggest local recurrence or metastatic disease within the chest, abdomen, or pelvis. 2. Hepatomegaly with hepatic steatosis. 3. Sigmoid colonic diverticulosis without findings of acute diverticulitis. 4. Similar prominent endplate sclerosis and irregularity at T2-T3 is most consistent with Modic type endplate degenerative changes. However,  if patient has referable upper thoracic pain consider further workup with thoracic spine  MRI. 5. Similar thickening of the endometrial stripe measuring up to 8 mm, which was previously biopsied with results negative for malignancy. 6. Aortic Atherosclerosis (ICD10-I70.0).   11/10/2021 PET scan   IMPRESSION: 1. LEFT ovary is increased in size and is intensely hypermetabolic. While physiologic hypermetabolic ovarian tissue is not uncommon, the enlargement and asymmetric activity warrants further evaluation. Consider contrast pelvic MRI vs tissue sampling. 2. No evidence of metastatic colorectal carcinoma otherwise. 3. Post RIGHT hemicolectomy anatomy. 4. Evidence of radiation change in the LEFT upper lobe (remote breast cancer).     12/22/2021 Relapse/Recurrence    FINAL MICROSCOPIC DIAGNOSIS:   A. LEFT OVARY AND FALLOPIAN TUBE, SALPINGO OOPHORECTOMY:  - Metastatic moderately differentiated colonic adenocarcinoma involving left ovary  - Focal ovarian stromal calcification  - Segment of benign fallopian tube   B. UTERUS WITH RIGHT FALLOPIAN TUBE AND OVARY, HYSTERECTOMY AND SALPINGO-OOPHORECTOMY:  - Metastatic moderately differentiated colonic adenocarcinoma involving right ovary  - Focal invasive extensive adenomyosis  - Benign endometrial polyps  - Benign proliferative phase endometrium  - Hydrosalpinx of right fallopian tube  - Focal ovarian stromal calcification   C. PERITONEAL DEPOSITS, ANTERIOR CUL DE SAC, BIOPSY:  - Metastatic mucinous adenocarcinoma, consistent with colorectal primary    COMMENT:  Immunohistochemical stains show that the tumor cells are positive for CK20 and CDX2 while they are negative for CK7 and PAX8, consistent with above interpretation.    01/24/2022 - 03/23/2022 Chemotherapy   Patient is on Treatment Plan : COLORECTAL FOLFOX q14d x 3 months     04/01/2022 Imaging   CT AP IMPRESSION: 1. New omental metastatic disease. 2. Vague hypoattenuating lesion  in the inferior right hepatic lobe is new and also worrisome for metastatic disease. 3. Similar small right lower lobe nodules. Recommend continued attention on follow-up. 4. Steatotic enlarged liver. 5.  Aortic atherosclerosis (ICD10-I70.0).   04/06/2022 -  Chemotherapy   Patient is on Treatment Plan : COLORECTAL FOLFIRI + Bevacizumab q14d     04/21/2022 Imaging    IMPRESSION: 1. Stable chest CT. No evidence of metastatic disease. 2. Stable small pulmonary nodules, considered benign based on stability. 3. Stable postsurgical changes in the left breast and anterior left upper lobe radiation changes. 4. Aortic Atherosclerosis (ICD10-I70.0) and Emphysema (ICD10-J43.9).   04/21/2022 Imaging    IMPRESSION: 1. Small enhancing lesion inferiorly in the right hepatic lobe is typical of metastatic disease and stable from recent abdominal CT. No other definite liver lesions are identified. Mild hepatic steatosis. 2. No other significant abdominal findings. 3. Omental nodularity seen on CT is not well visualized by MRI.    Imaging     06/23/2022 Imaging    IMPRESSION: 1. Hypodense lesion of the inferior right lobe of the liver, hepatic segment VI is diminished in size, consistent with treatment response. 2. Tiny peritoneal and omental nodules identified by prior examination are diminished in size, consistent with treatment response. 3. Multiple tiny pulmonary nodules unchanged, most likely benign and incidental, however continued attention on follow-up warranted in the setting of known metastatic disease. 4. No evidence of new metastatic disease in the chest, abdomen, or pelvis. 5. Status post partial right hemicolectomy and reanastomosis. 6. Diffuse mosaic attenuation of the airspaces, consistent with small airways disease. 7. Hepatomegaly.      Discussed the use of AI scribe software for clinical note transcription with the patient, who gave verbal consent to  proceed.  History of Present Illness   A 78 year old female with a history of metastatic colon  cancer presents with mouth sores and sinus congestion. The patient reports that the mouth sores are transient, appearing in different locations along the gums and soft palate. The sores cause discomfort, particularly when eating, but are manageable with over-the-counter treatments like Orgel. The patient also reports sinus congestion, but denies any fever or chills. The patient has been monitoring her temperature regularly and reports no significant changes. The patient also mentions a sore throat that comes and goes. The patient is due for a follow-up with the dentist in a couple of weeks.         All other systems were reviewed with the patient and are negative.  MEDICAL HISTORY:  Past Medical History:  Diagnosis Date   Aortic atherosclerosis (HCC)    Arthritis    feet, lower back   Basal cell carcinoma    arm   Breast cancer of upper-outer quadrant of left female breast (HCC) 06/04/2014   Cataract    immature on the left   Colon cancer (HCC) 08/2019   Diabetes mellitus without complication (HCC)    Diverticulosis    Dizziness    > 65yrs ago;took Antivert    Family history of anesthesia complication    sister slow to wake up with anesthesia   Family history of breast cancer    Family history of colon cancer    Family history of uterine cancer    GERD (gastroesophageal reflux disease)    takes occasional TUMs   History of bronchitis    > 27yrs ago   History of colon polyps    History of hiatal hernia    Small noted on CT   History of pulmonary embolus (PE)    Hypertension    takes Losartan daily and HCTZ   Iron deficiency anemia    Joint pain    Numbness    to toes on each foot   Peripheral neuropathy    feet and toes   Personal history of radiation therapy    Pulmonary nodules    Noted on CT   Radiation 07/31/14-08/28/14   Left Breast 20 fxs   Seasonal allergies    takes  Claritin prn   Urinary frequency    Vitamin D deficiency    takes VIt D daily    SURGICAL HISTORY: Past Surgical History:  Procedure Laterality Date   BREAST BIOPSY Bilateral    BREAST LUMPECTOMY Left    BREAST LUMPECTOMY WITH RADIOACTIVE SEED LOCALIZATION Left 07/01/2014   Procedure: LEFT BREAST LUMPECTOMY WITH RADIOACTIVE SEED LOCALIZATION;  Surgeon: Glenna Fellows, MD;  Location: [facility];  Service: General;  Laterality: Left;   CATARACT EXTRACTION Right    COLONOSCOPY  09/24/2019   [place]   LAPAROSCOPIC PARTIAL COLECTOMY N/A 11/01/2019   Procedure: LAPAROSCOPIC PARTIAL COLECTOMY;  Surgeon: [name], MD;  Location: [facility];  Service: General;  Laterality: N/A;   POLYPECTOMY     PORTACATH PLACEMENT N/A 12/10/2019   Procedure: INSERTION PORT-A-CATH ULTRASOUND GUIDED IN RIGHT IJ;  Surgeon: [name], MD;  Location: [facility];  Service: General;  Laterality: N/A;   ROBOTIC ASSISTED TOTAL HYSTERECTOMY Bilateral 12/22/2021   Procedure: XI ROBOTIC ASSISTED TOTAL HYSTERECTOMY WITH BILATERAL SALPINGO OOPHORECTOMY;  Surgeon: Carver Fila, MD;  Location: [facility];  Service: Gynecology;  Laterality: Bilateral;   TOTAL KNEE ARTHROPLASTY Left 10/24/2012   Procedure: TOTAL KNEE ARTHROPLASTY;  Surgeon: Nestor Lewandowsky, MD;  Location: [facility];  Service: Orthopedics;  Laterality: Left;   TOTAL KNEE ARTHROPLASTY Right 01/09/2013  Procedure: TOTAL KNEE ARTHROPLASTY;  Surgeon: Nestor Lewandowsky, MD;  Location: [facility];  Service: Orthopedics;  Laterality: Right;   TUBAL LIGATION      I have reviewed the social history and family history with the patient and they are unchanged from previous note.  ALLERGIES:  is allergic to oxycodone.  MEDICATIONS:  Current Outpatient Medications  Medication Sig Dispense Refill   apixaban (ELIQUIS) 5 MG TABS tablet Take 1 tablet (5 mg total) by mouth 2 (two) times daily. 60 tablet 5   b complex vitamins capsule Take 1 capsule by mouth daily.      Cholecalciferol (VITAMIN D) 2000 UNITS CAPS Take 2,000 Units by mouth daily.      ibuprofen (ADVIL) 200 MG tablet Take 200 mg by mouth daily as needed (pain).     lidocaine-prilocaine (EMLA) cream Apply 1 Application topically as needed. 30 g 0   loratadine (CLARITIN) 10 MG tablet Take 10 mg by mouth daily as needed for allergies.     losartan (COZAAR) 25 MG tablet Take 1 tablet (25 mg total) by mouth daily. 90 tablet 3   metFORMIN (GLUCOPHAGE) 500 MG tablet Take 1 tablet (500 mg total) by mouth in the morning and at bedtime. 180 tablet 3   Multiple Vitamins-Minerals (MULTIVITAMIN WITH MINERALS) tablet Take 1 tablet by mouth daily.     omeprazole (PRILOSEC) 20 MG capsule Take 20 mg by mouth daily before breakfast.      ondansetron (ZOFRAN) 8 MG tablet Take 1 tablet (8 mg total) by mouth every 8 (eight) hours as needed for nausea or vomiting. Start on the third day after chemotherapy. 30 tablet 1   potassium chloride (KLOR-CON) 10 MEQ tablet Take 1 tablet (10 mEq total) by mouth 2 (two) times daily. 60 tablet 1   pravastatin (PRAVACHOL) 10 MG tablet Take 1 tablet (10 mg total) by mouth daily. 90 tablet 3   pregabalin (LYRICA) 25 MG capsule Take 1 capsule (25 mg total) by mouth every morning. Take in addition to 50 mg at bedtime 60 capsule 2   pregabalin (LYRICA) 50 MG capsule Take 1 capsule (50 mg total) by mouth at bedtime. Take in addition to 25 mg in the morning 60 capsule 2   prochlorperazine (COMPAZINE) 10 MG tablet Take 1 tablet (10 mg total) by mouth every 6 (six) hours as needed for nausea or vomiting. 30 tablet 1   triamcinolone ointment (KENALOG) 0.5 % Apply 1 Application topically 2 (two) times daily. 30 g 0   No current facility-administered medications for this visit.   Facility-Administered Medications Ordered in Other Visits  Medication Dose Route Frequency Provider Last Rate Last Admin   atropine injection 0.5 mg  0.5 mg Intravenous Once PRN [name], MD        bevacizumab-awwb (MVASI) 500 mg in sodium chloride 0.9 % 100 mL chemo infusion  5 mg/kg (Treatment Plan Recorded) Intravenous Once [name], MD       fluorouracil (ADRUCIL) 5,000 mg in sodium chloride 0.9 % 150 mL chemo infusion  2,400 mg/m2 (Treatment Plan Recorded) Intravenous 1 day or 1 dose [name], MD       irinotecan (CAMPTOSAR) 220 mg in sodium chloride 0.9 % 500 mL chemo infusion  100 mg/m2 (Treatment Plan Recorded) Intravenous Once [name], MD       leucovorin 896 mg in sodium chloride 0.9 % 250 mL infusion  400 mg/m2 (Treatment Plan Recorded) Intravenous Once [name], MD  PHYSICAL EXAMINATION: ECOG PERFORMANCE STATUS: 1 - Symptomatic but completely ambulatory  Vitals:   05/29/23 0851  BP: (!) 140/74  Pulse: 67  Resp: 16  Temp: 97.6 F (36.4 C)  SpO2: 99%   Wt Readings from Last 3 Encounters:  05/29/23 212 lb 9.6 oz (96.4 kg)  05/15/23 213 lb 11.2 oz (96.9 kg)  05/02/23 213 lb 4.8 oz (96.8 kg)     GENERAL:alert, no distress and comfortable SKIN: skin color, texture, turgor are normal, no rashes or significant lesions EYES: normal, Conjunctiva are pink and non-injected, sclera clear NECK: supple, thyroid normal size, non-tender, without nodularity LYMPH:  no palpable lymphadenopathy in the cervical, axillary  LUNGS: clear to auscultation and percussion with normal breathing effort HEART: regular rate & rhythm and no murmurs and no lower extremity edema ABDOMEN:abdomen soft, non-tender and normal bowel sounds Musculoskeletal:no cyanosis of digits and no clubbing  NEURO: alert & oriented x 3 with fluent speech, no focal motor/sensory deficits    LABORATORY DATA:  I have reviewed the data as listed    Latest Ref Rng & Units 05/29/2023    8:19 AM 05/15/2023    8:19 AM 05/02/2023    8:05 AM  CBC  WBC 4.0 - 10.5 K/uL 15.1  7.6  11.3   Hemoglobin 12.0 - 15.0 g/dL 56.2  13.0  86.5   Hematocrit 36.0 - 46.0 % 34.5  33.1  34.9   Platelets 150 - 400 K/uL 242   203  220         Latest Ref Rng & Units 05/29/2023    8:19 AM 05/15/2023    8:19 AM 05/02/2023    8:05 AM  CMP  Glucose 70 - 99 mg/dL 784  696  295   BUN 8 - 23 mg/dL 17  16  23    Creatinine 0.44 - 1.00 mg/dL 2.84  1.32  4.40   Sodium 135 - 145 mmol/L 140  140  142   Potassium 3.5 - 5.1 mmol/L 4.5  4.6  4.5   Chloride 98 - 111 mmol/L 107  109  110   CO2 22 - 32 mmol/L 26  25  25    Calcium 8.9 - 10.3 mg/dL 9.4  9.0  9.2   Total Protein 6.5 - 8.1 g/dL 6.6  6.3  6.4   Total Bilirubin 0.0 - 1.2 mg/dL 0.7  0.7  0.6   Alkaline Phos 38 - 126 U/L 180  134  135   AST 15 - 41 U/L 45  27  30   ALT 0 - 44 U/L 41  16  22       RADIOGRAPHIC STUDIES: I have personally reviewed the radiological images as listed and agreed with the findings in the report. No results found.    No orders of the defined types were placed in this encounter.  All questions were answered. The patient knows to call the clinic with any problems, questions or concerns. No barriers to learning was detected. The total time spent in the appointment was 30 minutes.     [name], MD 05/29/2023

## 2023-05-29 NOTE — Patient Instructions (Signed)
[facility] - A DEPT OF [facility]   Discharge Instructions: Thank you for choosing [facility] to provide your oncology and hematology care.   If you have a lab appointment with the Cancer Center, please go directly to the Cancer Center and check in at the registration area.   Wear comfortable clothing and clothing appropriate for easy access to any Portacath or PICC line.   We strive to give you quality time with your provider. You may need to reschedule your appointment if you arrive late (15 or more minutes).  Arriving late affects you and other patients whose appointments are after yours.  Also, if you miss three or more appointments without notifying the office, you may be dismissed from the clinic at the provider's discretion.      For prescription refill requests, have your pharmacy contact our office and allow 72 hours for refills to be completed.    Today you received the following chemotherapy and/or immunotherapy agents: Bevacizumab (Mvasi), Irinotecan, Leucovorin, and Fluorouracil (Adrucil)      To help prevent nausea and vomiting after your treatment, we encourage you to take your nausea medication as directed.  BELOW ARE SYMPTOMS THAT SHOULD BE REPORTED IMMEDIATELY: *FEVER GREATER THAN 100.4 F (38 C) OR HIGHER *CHILLS OR SWEATING *NAUSEA AND VOMITING THAT IS NOT CONTROLLED WITH YOUR NAUSEA MEDICATION *UNUSUAL SHORTNESS OF BREATH *UNUSUAL BRUISING OR BLEEDING *URINARY PROBLEMS (pain or burning when urinating, or frequent urination) *BOWEL PROBLEMS (unusual diarrhea, constipation, pain near the anus) TENDERNESS IN MOUTH AND THROAT WITH OR WITHOUT PRESENCE OF ULCERS (sore throat, sores in mouth, or a toothache) UNUSUAL RASH, SWELLING OR PAIN  UNUSUAL VAGINAL DISCHARGE OR ITCHING   Items with * indicate a potential emergency and should be followed up as soon as possible or go to the Emergency Department if any problems should  occur.  Please show the CHEMOTHERAPY ALERT CARD or IMMUNOTHERAPY ALERT CARD at check-in to the Emergency Department and triage nurse.  Should you have questions after your visit or need to cancel or reschedule your appointment, please contact [facility] - A DE[facility]  Dept: [phone]  and follow the prompts.  Office hours are 8:00 a.m. to 4:30 p.m. Monday - Friday. Please note that voicemails left after 4:00 p.m. may not be returned until the following business day.  We are closed weekends and major holidays. You have access to a nurse at all times for urgent questions. Please call the main number to the clinic Dept: [phone] and follow the prompts.   For any non-urgent questions, you may also contact your provider using MyChart. We now offer e-Visits for anyone 87 and older to request care online for non-urgent symptoms. For details visit [email or web address].   Also download the MyChart app! Go to the app store, search "MyChart", open the app, select , and log in with your MyChart username and password.    The chemotherapy medication bag should finish at 46 hours, 96 hours, or 7 days. For example, if your pump is scheduled for 46 hours and it was put on at 4:00 p.m., it should finish at 2:00 p.m. the day it is scheduled to come off regardless of your appointment time.     Estimated time to finish at    If the display on your pump reads "Low Volume" and it is beeping, take the batteries out of the pump  and come to the cancer center for it to be taken off.   If the pump alarms go off prior to the pump reading "Low Volume" then call [phone] and someone can assist you.  If the plunger comes out and the chemotherapy medication is leaking out, please use your home chemo spill kit to clean up the spill. Do NOT use paper towels or other household products.  If you have problems or questions regarding your pump, please call either  [phone] (24 hours a day) or the cancer center Monday-Friday 8:00 a.m.- 4:30 p.m. at the clinic number and we will assist you. If you are unable to get assistance, then go to the nearest Emergency Department and ask the staff to contact the IV team for assistance.

## 2023-05-30 ENCOUNTER — Encounter: Payer: Self-pay | Admitting: Family Medicine

## 2023-05-30 ENCOUNTER — Encounter: Payer: Self-pay | Admitting: Nurse Practitioner

## 2023-05-30 ENCOUNTER — Ambulatory Visit: Payer: Medicare Other | Admitting: Family Medicine

## 2023-05-30 VITALS — BP 122/68 | HR 61 | Temp 98.2°F | Ht 65.5 in | Wt 213.0 lb

## 2023-05-30 DIAGNOSIS — I10 Essential (primary) hypertension: Secondary | ICD-10-CM | POA: Diagnosis not present

## 2023-05-30 DIAGNOSIS — I2694 Multiple subsegmental pulmonary emboli without acute cor pulmonale: Secondary | ICD-10-CM

## 2023-05-30 DIAGNOSIS — E1169 Type 2 diabetes mellitus with other specified complication: Secondary | ICD-10-CM | POA: Diagnosis not present

## 2023-05-30 DIAGNOSIS — E785 Hyperlipidemia, unspecified: Secondary | ICD-10-CM

## 2023-05-30 DIAGNOSIS — E7849 Other hyperlipidemia: Secondary | ICD-10-CM | POA: Diagnosis not present

## 2023-05-30 DIAGNOSIS — C182 Malignant neoplasm of ascending colon: Secondary | ICD-10-CM

## 2023-05-30 DIAGNOSIS — C786 Secondary malignant neoplasm of retroperitoneum and peritoneum: Secondary | ICD-10-CM | POA: Diagnosis not present

## 2023-05-30 DIAGNOSIS — R748 Abnormal levels of other serum enzymes: Secondary | ICD-10-CM

## 2023-05-30 DIAGNOSIS — M545 Low back pain, unspecified: Secondary | ICD-10-CM

## 2023-05-30 DIAGNOSIS — Z7984 Long term (current) use of oral hypoglycemic drugs: Secondary | ICD-10-CM | POA: Diagnosis not present

## 2023-05-30 LAB — LIPID PANEL
Cholesterol: 130 mg/dL (ref 0–200)
HDL: 77.5 mg/dL (ref >39.00)
LDL Cholesterol: 30 mg/dL (ref 0–99)
NonHDL: 52.45
Total CHOL/HDL Ratio: 2
Triglycerides: 114 mg/dL (ref 0.0–149.0)
VLDL: 22.8 mg/dL (ref 0.0–40.0)

## 2023-05-30 LAB — BASIC METABOLIC PANEL
BUN: 24 mg/dL — ABNORMAL HIGH (ref 6–23)
CO2: 24 meq/L (ref 19–32)
Calcium: 9.3 mg/dL (ref 8.4–10.5)
Chloride: 106 meq/L (ref 96–112)
Creatinine, Ser: 0.81 mg/dL (ref 0.40–1.20)
GFR: 71.15 mL/min (ref >60.00)
Glucose, Bld: 110 mg/dL — ABNORMAL HIGH (ref 70–99)
Potassium: 4.2 meq/L (ref 3.5–5.1)
Sodium: 139 meq/L (ref 135–145)

## 2023-05-30 LAB — GAMMA GT: GGT: 80 U/L — ABNORMAL HIGH (ref 7–51)

## 2023-05-30 LAB — HEMOGLOBIN A1C: Hgb A1c MFr Bld: 6.5 % (ref 4.6–6.5)

## 2023-05-30 NOTE — Assessment & Plan Note (Signed)
Will check annual DM labs today. Continue metformin 500 mg bid.

## 2023-05-30 NOTE — Progress Notes (Signed)
[facility] [address] [place] [address] Dept: [phone] Dept Fax: [phone]  Chronic Care Office Visit  Subjective:    Patient ID: Kim Castro, female    DOB: [birth year], 78 y.o..   MRN: [id number]  Chief Complaint  Patient presents with   Hypertension    3 month f/u.  C/o having a pain in the lower RT back radiating into the RT leg.    History of Present Illness:  Patient is in today for reassessment of chronic medical issues.   [name] has a history of type 2 diabetes. She is managed on metformin 500 mg bid.    [name] has a history of hypertension. She is managed on losartan 25 mg daily.   [name] has a history of hyperlipidemia. She is currently on pravastatin 10 mg daily.  [name] has a history of both breast and colon cancer. Earlier in 2023, [name] was found to have metastatic colon cancer throughout the peritoneal cavity, esp. involving both ovaries. She underwent a total hysterectomy. She currently has a Port-a-cath in place and is receiving chemotherapy. She is receiving this every other week. In early June, she was noted to have pulmonary emboli. These were felt to be a side effect of her bevacizumab, which has been stopped. She is now on apixaban, which will continue until her chemotherapies are stopped. Her oncologist has been following liver lesions which apparently are improving. [name] notes a plan to continue chemotherapy until she has completed the total planned accumulative doses.  [name] notes she has recently been experiencing some right lower back pain. The pain is often triggered by right leg movement, but is not associated with any radiation tot he right leg.  Past Medical History: Patient Active Problem List   Diagnosis Date Noted   Pulmonary embolism (HCC) 10/02/2022   Macrocytic anemia 10/02/2022   Leukocytosis 10/02/2022   Metastasis to peritoneal cavity (HCC)  01/12/2022   Nausea without vomiting 12/23/2021   Thickened endometrium    Essential hypertension 05/12/2021   Vitamin D deficiency 05/12/2021   Personal history of malignant neoplasm of breast 05/12/2021   History of colonic polyps 05/12/2021   Morbid obesity (HCC) 05/12/2021   Hyperlipidemia 05/12/2021   Hypercalcemia 05/12/2021   Allergic rhinitis 05/12/2021   Knee joint replacement status, bilateral 05/12/2021   Peripheral neuropathy due to chemotherapy (HCC) 05/12/2021   Aortic atherosclerosis (HCC) 05/12/2021   Gastroesophageal reflux disease    Type 2 diabetes mellitus with hyperlipidemia (HCC)    History of pulmonary embolism 01/27/2020   Port-A-Cath in place 12/30/2019   Malignant neoplasm of ascending colon (HCC) 11/01/2019   Genetic testing 07/08/2014   Malignant neoplasm of female breast (HCC) 06/04/2014   Past Surgical History:  Procedure Laterality Date   BREAST BIOPSY Bilateral    BREAST LUMPECTOMY Left    BREAST LUMPECTOMY WITH RADIOACTIVE SEED LOCALIZATION Left 07/01/2014   Procedure: LEFT BREAST LUMPECTOMY WITH RADIOACTIVE SEED LOCALIZATION;  Surgeon: Glenna Fellows, MD;  Location: [facility];  Service: General;  Laterality: Left;   CATARACT EXTRACTION Right    COLONOSCOPY  09/24/2019   [place]   LAPAROSCOPIC PARTIAL COLECTOMY N/A 11/01/2019   Procedure: LAPAROSCOPIC PARTIAL COLECTOMY;  Surgeon: [name], MD;  Location: [facility];  Service: General;  Laterality: N/A;   POLYPECTOMY     PORTACATH PLACEMENT N/A 12/10/2019   Procedure: INSERTION PORT-A-CATH ULTRASOUND GUIDED IN RIGHT IJ;  Surgeon: [name], MD;  Location: [facility];  Service: General;  Laterality: N/A;   ROBOTIC ASSISTED TOTAL HYSTERECTOMY Bilateral 12/22/2021   Procedure: XI ROBOTIC ASSISTED TOTAL HYSTERECTOMY WITH BILATERAL SALPINGO OOPHORECTOMY;  Surgeon: Carver Fila, MD;  Location: [facility];  Service: Gynecology;  Laterality: Bilateral;   TOTAL KNEE ARTHROPLASTY  Left 10/24/2012   Procedure: TOTAL KNEE ARTHROPLASTY;  Surgeon: Nestor Lewandowsky, MD;  Location: [facility];  Service: Orthopedics;  Laterality: Left;   TOTAL KNEE ARTHROPLASTY Right 01/09/2013   Procedure: TOTAL KNEE ARTHROPLASTY;  Surgeon: Nestor Lewandowsky, MD;  Location: [facility];  Service: Orthopedics;  Laterality: Right;   TUBAL LIGATION     Family History  Problem Relation Age of Onset   Diabetes Mother    Uterine cancer Mother        deceased 41   Hypertension Father    Heart disease Father    Diabetes Sister    Breast cancer Sister 54       currently 37   Hypertension Sister    Diabetes Sister    Breast cancer Sister    Diabetes Sister    Kidney disease Sister    Heart disease Sister    Lupus Sister    Heart disease Sister    Diabetes Sister    Breast cancer Sister    Diabetes Sister    Breast cancer Daughter    Colon polyps Daughter    Thyroid cancer Daughter 55       currently 4; type?   Cancer Maternal Uncle        unk. primary; deceased 67s   Stroke Maternal Grandmother    Hypertension Brother    Diabetes Brother    Colon cancer Brother 37   Esophageal cancer Neg Hx    Rectal cancer Neg Hx    Stomach cancer Neg Hx    Outpatient Medications Prior to Visit  Medication Sig Dispense Refill   apixaban (ELIQUIS) 5 MG TABS tablet Take 1 tablet (5 mg total) by mouth 2 (two) times daily. 60 tablet 5   b complex vitamins capsule Take 1 capsule by mouth daily.     Cholecalciferol (VITAMIN D) 2000 UNITS CAPS Take 2,000 Units by mouth daily.      ibuprofen (ADVIL) 200 MG tablet Take 200 mg by mouth daily as needed (pain).     lidocaine-prilocaine (EMLA) cream Apply 1 Application topically as needed. 30 g 0   loratadine (CLARITIN) 10 MG tablet Take 10 mg by mouth daily as needed for allergies.     losartan (COZAAR) 25 MG tablet Take 1 tablet (25 mg total) by mouth daily. 90 tablet 3   metFORMIN (GLUCOPHAGE) 500 MG tablet Take 1 tablet (500 mg total) by mouth in the morning and at  bedtime. 180 tablet 3   Multiple Vitamins-Minerals (MULTIVITAMIN WITH MINERALS) tablet Take 1 tablet by mouth daily.     omeprazole (PRILOSEC) 20 MG capsule Take 20 mg by mouth daily before breakfast.      ondansetron (ZOFRAN) 8 MG tablet Take 1 tablet (8 mg total) by mouth every 8 (eight) hours as needed for nausea or vomiting. Start on the third day after chemotherapy. 30 tablet 1   potassium chloride (KLOR-CON) 10 MEQ tablet Take 1 tablet (10 mEq total) by mouth 2 (two) times daily. 60 tablet 1   pravastatin (PRAVACHOL) 10 MG tablet Take 1 tablet (10 mg total) by mouth daily. 90 tablet 3   pregabalin (LYRICA) 25 MG capsule Take 1 capsule (25 mg total)  by mouth every morning. Take in addition to 50 mg at bedtime 60 capsule 2   pregabalin (LYRICA) 50 MG capsule Take 1 capsule (50 mg total) by mouth at bedtime. Take in addition to 25 mg in the morning 60 capsule 2   prochlorperazine (COMPAZINE) 10 MG tablet Take 1 tablet (10 mg total) by mouth every 6 (six) hours as needed for nausea or vomiting. 30 tablet 1   triamcinolone ointment (KENALOG) 0.5 % Apply 1 Application topically 2 (two) times daily. 30 g 0   No facility-administered medications prior to visit.   Allergies  Allergen Reactions   Oxycodone Nausea And Vomiting   Objective:   Today's Vitals   05/30/23 1341  BP: 122/68  Pulse: 61  Temp: 98.2 F (36.8 C)  TempSrc: Temporal  SpO2: 97%  Weight: 213 lb (96.6 kg)  Height: 5' 5.5" (1.664 m)   Body mass index is 34.91 kg/m.   General: Well developed, well nourished. No acute distress. Back: Straight. Pain noted over the right paralumbar muscles. Psych: Alert and oriented. Normal mood and affect.  Health Maintenance Due  Topic Date Due   Diabetic kidney evaluation - Urine ACR  05/13/2023   Lab Results    Latest Ref Rng & Units 05/29/2023    8:19 AM 05/15/2023    8:19 AM 05/02/2023    8:05 AM  CBC  WBC 4.0 - 10.5 K/uL 15.1  7.6  11.3   Hemoglobin 12.0 - 15.0 g/dL 09.8   11.9  14.7   Hematocrit 36.0 - 46.0 % 34.5  33.1  34.9   Platelets 150 - 400 K/uL 242  203  220        Latest Ref Rng & Units 05/29/2023    8:19 AM 05/15/2023    8:19 AM 05/02/2023    8:05 AM  CMP  Glucose 70 - 99 mg/dL 829  562  130   BUN 8 - 23 mg/dL 17  16  23    Creatinine 0.44 - 1.00 mg/dL 8.65  7.84  6.96   Sodium 135 - 145 mmol/L 140  140  142   Potassium 3.5 - 5.1 mmol/L 4.5  4.6  4.5   Chloride 98 - 111 mmol/L 107  109  110   CO2 22 - 32 mmol/L 26  25  25    Calcium 8.9 - 10.3 mg/dL 9.4  9.0  9.2   Total Protein 6.5 - 8.1 g/dL 6.6  6.3  6.4   Total Bilirubin 0.0 - 1.2 mg/dL 0.7  0.7  0.6   Alkaline Phos 38 - 126 U/L 180  134  135   AST 15 - 41 U/L 45  27  30   ALT 0 - 44 U/L 41  16  22    Component Ref Range & Units (hover) 1 d ago (05/29/23) 2 wk ago (05/15/23) 4 wk ago (05/02/23) 1 mo ago (04/10/23)  CEA ([facility]) 8.84 High  7.54 High  CM 5.96 High  CM 7.71 High  CM   Assessment & Plan:   Problem List Items Addressed This Visit       Cardiovascular and Mediastinum   Essential hypertension - Primary   Blood pressure is in good control. Continue losartan 25 mg daily.      Pulmonary embolism (HCC)   -incidental findings of bilateral distal PEs on restaging CT 10/02/2022, pt was asymptomatic -doppler of LEs (-) DVT -continue Eliquis indefinitely           Digestive  Malignant neoplasm of ascending colon (HCC)   Continue chemotherapy and following with oncology.        Endocrine   Type 2 diabetes mellitus with hyperlipidemia (HCC)   Will check annual DM labs today. Continue metformin 500 mg bid.      Relevant Orders   Lipid panel   Microalbumin / creatinine urine ratio   Basic metabolic panel   Hemoglobin A1c   Urinalysis, Routine w reflex microscopic     Other   Acute right-sided low back pain without sciatica   Etiology is unclear. In light of cancer diagnosis and increased alkaline phosphatase, imaging would be recommended. She has a CT of her  chest, abdomen, and pelvis coming up in 3 weeks. I do think it is reasonable to await this study. I recommend she try takign ibuprofen when the pain is significant.      Elevated alkaline phosphatase level   Ms. Feliciano's recent alk phos was increased from her prior long-standing mild elevation. I will check a GGT and alkaline phosphatase isoenzymes to determine the source of this.      Relevant Orders   Gamma GT   Alkaline phosphatase, isoenzymes   Hyperlipidemia   I will check lipids today. Continue pravastatin 10 mg daily.      Relevant Orders   Lipid panel   Metastasis to peritoneal cavity (HCC)   As above.       Return in about 3 months (around 08/28/2023) for Reassessment.   [name], MD

## 2023-05-30 NOTE — Assessment & Plan Note (Signed)
Blood pressure is in good control. Continue losartan 25 mg daily.

## 2023-05-30 NOTE — Assessment & Plan Note (Signed)
-  incidental findings of bilateral distal PEs on restaging CT 10/02/2022, pt was asymptomatic -doppler of LEs (-) DVT -continue Eliquis indefinitely

## 2023-05-30 NOTE — Assessment & Plan Note (Signed)
Kim Castro's recent alk phos was increased from her prior long-standing mild elevation. I will check a GGT and alkaline phosphatase isoenzymes to determine the source of this.

## 2023-05-30 NOTE — Assessment & Plan Note (Addendum)
Etiology is unclear. In light of cancer diagnosis and increased alkaline phosphatase, imaging would be recommended. She has a CT of her chest, abdomen, and pelvis coming up in 3 weeks. I do think it is reasonable to await this study. I recommend she try takign ibuprofen when the pain is significant.

## 2023-05-30 NOTE — Assessment & Plan Note (Signed)
As above.

## 2023-05-30 NOTE — Assessment & Plan Note (Signed)
I will check lipids today. Continue pravastatin 10 mg daily.

## 2023-05-30 NOTE — Assessment & Plan Note (Signed)
Continue chemotherapy and following with oncology.

## 2023-05-31 ENCOUNTER — Inpatient Hospital Stay: Payer: Medicare Other

## 2023-05-31 VITALS — BP 128/43 | HR 65 | Temp 98.6°F

## 2023-05-31 DIAGNOSIS — G620 Drug-induced polyneuropathy: Secondary | ICD-10-CM | POA: Diagnosis not present

## 2023-05-31 DIAGNOSIS — Z803 Family history of malignant neoplasm of breast: Secondary | ICD-10-CM | POA: Diagnosis not present

## 2023-05-31 DIAGNOSIS — Z5111 Encounter for antineoplastic chemotherapy: Secondary | ICD-10-CM | POA: Diagnosis not present

## 2023-05-31 DIAGNOSIS — C182 Malignant neoplasm of ascending colon: Secondary | ICD-10-CM

## 2023-05-31 DIAGNOSIS — C787 Secondary malignant neoplasm of liver and intrahepatic bile duct: Secondary | ICD-10-CM | POA: Diagnosis not present

## 2023-05-31 DIAGNOSIS — Z853 Personal history of malignant neoplasm of breast: Secondary | ICD-10-CM | POA: Diagnosis not present

## 2023-05-31 DIAGNOSIS — Z5189 Encounter for other specified aftercare: Secondary | ICD-10-CM | POA: Diagnosis not present

## 2023-05-31 DIAGNOSIS — C7963 Secondary malignant neoplasm of bilateral ovaries: Secondary | ICD-10-CM | POA: Diagnosis not present

## 2023-05-31 DIAGNOSIS — Z8049 Family history of malignant neoplasm of other genital organs: Secondary | ICD-10-CM | POA: Diagnosis not present

## 2023-05-31 DIAGNOSIS — Z800 Family history of malignant neoplasm of digestive organs: Secondary | ICD-10-CM | POA: Diagnosis not present

## 2023-05-31 DIAGNOSIS — C786 Secondary malignant neoplasm of retroperitoneum and peritoneum: Secondary | ICD-10-CM | POA: Diagnosis not present

## 2023-05-31 DIAGNOSIS — Z95828 Presence of other vascular implants and grafts: Secondary | ICD-10-CM

## 2023-05-31 DIAGNOSIS — Z90722 Acquired absence of ovaries, bilateral: Secondary | ICD-10-CM | POA: Diagnosis not present

## 2023-05-31 DIAGNOSIS — Z90710 Acquired absence of both cervix and uterus: Secondary | ICD-10-CM | POA: Diagnosis not present

## 2023-05-31 MED ORDER — SODIUM CHLORIDE 0.9% FLUSH
10.0000 mL | INTRAVENOUS | Status: DC | PRN
  Administered 2023-05-31: 10 mL

## 2023-05-31 MED ORDER — PEGFILGRASTIM-CBQV 6 MG/0.6ML ~~LOC~~ SOSY
6.0000 mg | PREFILLED_SYRINGE | Freq: Once | SUBCUTANEOUS | Status: DC
  Filled 2023-05-31: qty 0.6

## 2023-05-31 MED ORDER — HEPARIN SOD (PORK) LOCK FLUSH 100 UNIT/ML IV SOLN
500.0000 [IU] | Freq: Once | INTRAVENOUS | Status: AC | PRN
  Administered 2023-05-31: 500 [IU]

## 2023-06-05 ENCOUNTER — Other Ambulatory Visit: Payer: Self-pay

## 2023-06-05 ENCOUNTER — Other Ambulatory Visit: Payer: Self-pay | Admitting: Hematology

## 2023-06-11 NOTE — Assessment & Plan Note (Signed)
 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 Path showed overall Stage IIIB cancer.  -s/p 6 months adjuvant FOLFOX, completed 06/01/20, oxaliplatin held for final 2 cycles due to neuropathy.  -s/p BSO and hysterectomy on 12/22/21 with Dr. Pricilla Holm. Path revealed metastatic moderately differentiated colonic adenocarcinoma involving both ovaries and peritoneum -she restarted FOLFOX on 01/24/22. -Due to disease progression, chemotherapy changed to second line FOLFIRI and bevacizumab on April 06, 2022. She has been tolerating moderately well, better now with dose reduction, will continue -Restaging CT scan from 09/27/2022 showed continous response in liver and peritoneal metastasis, no other new lesions.   -We discussed maintenance therapy option in future  -due to her PE in 10/2022, will hold beva for 3 months  -restaging CT 12/31/2022 showed stable disease and live met is not well defined on CT. She restarted beva on 01/17/23  -restaging CT 03/23/2023 showed overall stable disease (stable small lung nodules, no other evidence of cancer)

## 2023-06-12 ENCOUNTER — Inpatient Hospital Stay: Payer: Medicare Other

## 2023-06-12 ENCOUNTER — Inpatient Hospital Stay: Payer: Medicare Other | Admitting: Hematology

## 2023-06-12 ENCOUNTER — Inpatient Hospital Stay: Payer: Medicare Other | Attending: Hematology

## 2023-06-12 ENCOUNTER — Encounter: Payer: Self-pay | Admitting: Hematology

## 2023-06-12 VITALS — BP 150/72 | HR 65 | Temp 97.6°F | Resp 16 | Ht 65.5 in | Wt 216.7 lb

## 2023-06-12 DIAGNOSIS — C182 Malignant neoplasm of ascending colon: Secondary | ICD-10-CM

## 2023-06-12 DIAGNOSIS — I10 Essential (primary) hypertension: Secondary | ICD-10-CM | POA: Insufficient documentation

## 2023-06-12 DIAGNOSIS — Z5189 Encounter for other specified aftercare: Secondary | ICD-10-CM | POA: Diagnosis not present

## 2023-06-12 DIAGNOSIS — R040 Epistaxis: Secondary | ICD-10-CM | POA: Diagnosis not present

## 2023-06-12 DIAGNOSIS — Z90722 Acquired absence of ovaries, bilateral: Secondary | ICD-10-CM | POA: Insufficient documentation

## 2023-06-12 DIAGNOSIS — C7963 Secondary malignant neoplasm of bilateral ovaries: Secondary | ICD-10-CM | POA: Diagnosis not present

## 2023-06-12 DIAGNOSIS — R918 Other nonspecific abnormal finding of lung field: Secondary | ICD-10-CM | POA: Insufficient documentation

## 2023-06-12 DIAGNOSIS — C787 Secondary malignant neoplasm of liver and intrahepatic bile duct: Secondary | ICD-10-CM | POA: Diagnosis not present

## 2023-06-12 DIAGNOSIS — Z5111 Encounter for antineoplastic chemotherapy: Secondary | ICD-10-CM | POA: Insufficient documentation

## 2023-06-12 DIAGNOSIS — M545 Low back pain, unspecified: Secondary | ICD-10-CM | POA: Diagnosis not present

## 2023-06-12 DIAGNOSIS — Z90710 Acquired absence of both cervix and uterus: Secondary | ICD-10-CM | POA: Insufficient documentation

## 2023-06-12 DIAGNOSIS — Z95828 Presence of other vascular implants and grafts: Secondary | ICD-10-CM

## 2023-06-12 LAB — CBC WITH DIFFERENTIAL (CANCER CENTER ONLY)
Abs Immature Granulocytes: 1 10*3/uL — ABNORMAL HIGH (ref 0.00–0.07)
Basophils Absolute: 0.1 10*3/uL (ref 0.0–0.1)
Basophils Relative: 0 %
Eosinophils Absolute: 0.2 10*3/uL (ref 0.0–0.5)
Eosinophils Relative: 1 %
HCT: 32.9 % — ABNORMAL LOW (ref 36.0–46.0)
Hemoglobin: 10.5 g/dL — ABNORMAL LOW (ref 12.0–15.0)
Immature Granulocytes: 7 %
Lymphocytes Relative: 27 %
Lymphs Abs: 4 10*3/uL (ref 0.7–4.0)
MCH: 32.3 pg (ref 26.0–34.0)
MCHC: 31.9 g/dL (ref 30.0–36.0)
MCV: 101.2 fL — ABNORMAL HIGH (ref 80.0–100.0)
Monocytes Absolute: 1.2 10*3/uL — ABNORMAL HIGH (ref 0.1–1.0)
Monocytes Relative: 8 %
Neutro Abs: 8.4 10*3/uL — ABNORMAL HIGH (ref 1.7–7.7)
Neutrophils Relative %: 57 %
Platelet Count: 240 10*3/uL (ref 150–400)
RBC: 3.25 MIL/uL — ABNORMAL LOW (ref 3.87–5.11)
RDW: 16.6 % — ABNORMAL HIGH (ref 11.5–15.5)
Smear Review: NORMAL
WBC Count: 14.8 10*3/uL — ABNORMAL HIGH (ref 4.0–10.5)
nRBC: 0.3 % — ABNORMAL HIGH (ref 0.0–0.2)

## 2023-06-12 LAB — CMP (CANCER CENTER ONLY)
ALT: 16 U/L (ref 0–44)
AST: 22 U/L (ref 15–41)
Albumin: 3.8 g/dL (ref 3.5–5.0)
Alkaline Phosphatase: 140 U/L — ABNORMAL HIGH (ref 38–126)
Anion gap: 7 (ref 5–15)
BUN: 17 mg/dL (ref 8–23)
CO2: 26 mmol/L (ref 22–32)
Calcium: 9.2 mg/dL (ref 8.9–10.3)
Chloride: 108 mmol/L (ref 98–111)
Creatinine: 0.89 mg/dL (ref 0.44–1.00)
GFR, Estimated: >60 mL/min (ref >60)
Glucose, Bld: 107 mg/dL — ABNORMAL HIGH (ref 70–99)
Potassium: 4.4 mmol/L (ref 3.5–5.1)
Sodium: 141 mmol/L (ref 135–145)
Total Bilirubin: 0.6 mg/dL (ref 0.0–1.2)
Total Protein: 6.3 g/dL — ABNORMAL LOW (ref 6.5–8.1)

## 2023-06-12 LAB — CEA (ACCESS): CEA (CHCC): 8.72 ng/mL — ABNORMAL HIGH (ref 0.00–5.00)

## 2023-06-12 LAB — TOTAL PROTEIN, URINE DIPSTICK: Protein, ur: NEGATIVE mg/dL

## 2023-06-12 MED ORDER — SODIUM CHLORIDE 0.9% FLUSH
10.0000 mL | INTRAVENOUS | Status: DC | PRN
  Administered 2023-06-12: 10 mL

## 2023-06-12 MED ORDER — HEPARIN SOD (PORK) LOCK FLUSH 100 UNIT/ML IV SOLN: 500.0000 [IU] | Freq: Once | INTRAVENOUS | Status: DC | PRN

## 2023-06-12 MED ORDER — SODIUM CHLORIDE 0.9 % IV SOLN
2400.0000 mg/m2 | INTRAVENOUS | Status: DC
  Administered 2023-06-12: 5000 mg via INTRAVENOUS
  Filled 2023-06-12: qty 100

## 2023-06-12 MED ORDER — IRINOTECAN HCL CHEMO INJECTION 100 MG/5ML
100.0000 mg/m2 | Freq: Once | INTRAVENOUS | Status: AC
  Administered 2023-06-12: 220 mg via INTRAVENOUS
  Filled 2023-06-12: qty 11

## 2023-06-12 MED ORDER — LIDOCAINE-PRILOCAINE 2.5-2.5 % EX CREA: 1.0000 | TOPICAL_CREAM | CUTANEOUS | 1 refills | Status: DC | PRN

## 2023-06-12 MED ORDER — SODIUM CHLORIDE 0.9 % IV SOLN
400.0000 mg/m2 | Freq: Once | INTRAVENOUS | Status: AC
  Administered 2023-06-12: 896 mg via INTRAVENOUS
  Filled 2023-06-12: qty 44.8

## 2023-06-12 MED ORDER — PALONOSETRON HCL INJECTION 0.25 MG/5ML
0.2500 mg | Freq: Once | INTRAVENOUS | Status: AC
  Administered 2023-06-12: 0.25 mg via INTRAVENOUS
  Filled 2023-06-12: qty 5

## 2023-06-12 MED ORDER — ATROPINE SULFATE 1 MG/ML IV SOLN
0.5000 mg | Freq: Once | INTRAVENOUS | Status: AC | PRN
  Administered 2023-06-12: 0.5 mg via INTRAVENOUS
  Filled 2023-06-12: qty 1

## 2023-06-12 MED ORDER — SODIUM CHLORIDE 0.9 % IV SOLN: Freq: Once | INTRAVENOUS | Status: AC

## 2023-06-12 MED ORDER — DEXAMETHASONE SODIUM PHOSPHATE 10 MG/ML IJ SOLN
10.0000 mg | Freq: Once | INTRAMUSCULAR | Status: AC
  Administered 2023-06-12: 10 mg via INTRAVENOUS
  Filled 2023-06-12: qty 1

## 2023-06-12 MED ORDER — SODIUM CHLORIDE 0.9 % IV SOLN
5.0000 mg/kg | Freq: Once | INTRAVENOUS | Status: AC
  Administered 2023-06-12: 500 mg via INTRAVENOUS
  Filled 2023-06-12: qty 4

## 2023-06-12 NOTE — Progress Notes (Signed)
 [facility]   [phone] Fax:([phone]   Clinic Follow up Note   Patient Care Team: [name], MD as PCP - General (Family Medicine) [name], MD as Consulting Physician (General Surgery) Sonja Joaquin, MD as Consulting Physician (Hematology) [name], MD as Consulting Physician (General Surgery) [name], MD as Consulting Physician (Hematology and Oncology) [facility], [facility]   Date of Service:  06/12/2023  CHIEF COMPLAINT: f/u of colon cancer  CURRENT THERAPY:  FOLFIRI and bevacizumab  2 weeks  Oncology History   Malignant neoplasm of ascending colon (HCC) pT3N2aM0 stage IIB, MSS, metastatic to b/l ovaries and peritoneum and liver, KRAS mutation G12V (+)  -Initially diagnosed in 08/2019, s/p resection with Dr Andy Bannister on 11/01/19. Path showed overall Stage IIIB cancer.  -s/p 6 months adjuvant FOLFOX, completed 06/01/20, oxaliplatin  held for final 2 cycles due to neuropathy.  -s/p BSO and hysterectomy on 12/22/21 with Dr. Orvil Bland. Path revealed metastatic moderately differentiated colonic adenocarcinoma involving both ovaries and peritoneum -she restarted FOLFOX on 01/24/22. -Due to disease progression, chemotherapy changed to second line FOLFIRI and bevacizumab  on April 06, 2022. She has been tolerating moderately well, better now with dose reduction, will continue -Restaging CT scan from 09/27/2022 showed continous response in liver and peritoneal metastasis, no other new lesions.   -We discussed maintenance therapy option in future  -due to her PE in 10/2022, will hold beva for 3 months  -restaging CT 12/31/2022 showed stable disease and live met is not well defined on CT. She restarted beva on 01/17/23  -restaging CT 03/23/2023 showed overall stable disease (stable small lung nodules, no other evidence of cancer)    Assessment and Plan    Metastatic Colon Cancer Follow-up for metastatic colon cancer.  Reports no issues with chemotherapy and has gained weight. No bowel movement issues. Scheduled for a CT scan next week to monitor disease progression. Previous CT scans have not shown bone involvement. Discussed potential need for MRI if CT scan is unremarkable. - Order CT scan next week - Review CT scan results   Low Back Pain Intermittent stabbing low back pain for six weeks, likely muscular. Pain severity ranges from 5 to 12 out of 10. Heating pad and ibuprofen provide some relief. Previous imaging (CT and MRI) did not show significant findings related to cancer. Discussed MRI if CT scan is unremarkable for further evaluation of joints and ligaments. - Consider MRI if CT scan is unremarkable - Refill lidocaine  cream  Hypertension Blood pressure slightly elevated. Monitors blood pressure at home. - Continue current antihypertensive medication - Monitor blood pressure at home  Epistaxis Occasional epistaxis when blowing nose, likely due to mucus buildup. No significant bleeding noted. - Monitor for changes in frequency or severity  General Health Maintenance Uses mouthwash for occasional gum sores. No current issues with sore throat or mouth ulcers. Continues Eliquis  for previous blood clot. - Continue using mouthwash - Continue Eliquis   Plan -Lab reviewed, adequate for treatment, will proceed to chemo today -She is scheduled for restaging CT scan on February 17 -Follow-up in 2 weeks to review CT scan images and chemo treatment     SUMMARY OF ONCOLOGIC HISTORY: Oncology History Overview Note   Cancer Staging  Malignant neoplasm of female breast [facility]) Staging form: Breast, AJCC 7th Edition - Clinical stage from 06/11/2014: Stage 0 (Tis (DCIS), N0, M0) - Unsigned Staged by: Pathologist and managing physician Laterality: Left Estrogen receptor status: Positive Progesterone receptor status: Positive Stage used  in treatment planning: Yes National guidelines used in treatment  planning: Yes Type of national guideline used in treatment planning: NCCN - Pathologic stage from 07/03/2014: Stage Unknown (Tis (DCIS), NX, cM0) - Signed by Windle Hatch, MD on 07/10/2014 Staged by: Pathologist Laterality: Left Estrogen receptor status: Positive Progesterone receptor status: Positive Stage used in treatment planning: Yes National guidelines used in treatment planning: Yes Type of national guideline used in treatment planning: NCCN Staging comments: Staged on final lumpectomy specimen by Dr. Asa Bjork.  Right colon cancer Staging form: Colon and Rectum, AJCC 8th Edition - Pathologic stage from 11/01/2019: Stage IIIB (pT3, pN2a, cM0) - Signed by Sonja Lyman, MD on 11/29/2019 Stage prefix: Initial diagnosis Histologic grading system: 4 grade system Histologic grade (G): G2 Residual tumor (R): R0 - None Tumor deposits (TD): Absent Perineural invasion (PNI): Absent Microsatellite instability (MSI): Stable KRAS mutation: Unknown NRAS mutation: Unknown BRAF mutation: Unknown     Malignant neoplasm of female breast (HCC)  05/29/2014 Initial Biopsy   Left breast needle core biopsy: Grade 2, DCIS with calcs. ER+ (100%), PR+ (96%).    06/04/2014 Initial Diagnosis   Left breast DCIS with calcifications, ER 100%, PR 96%   06/10/2014 Breast MRI   Left breast: 2.4 x 1.3 x 1.1 cm area of patchy non-mass enhancement upper outer quadrant includes postbiopsy seroma; Right breast: 1.2 cm previously biopsied stable benign fibroadenoma   06/12/2014 Procedure   Genetic counseling/testing: Identified 1 VUS on CHEK2 gene. Remainder of 17 gene panel tested negative and included: ATM, BARD1, BRCA 1/2, BRIP1, CDH1, CHEK2, EPCAM, MLH1, MSH2, MSH6, NBN, NF1, PALB2, PTEN, RAD50, RAD51C, RAD51D, STK11, and TP53.    07/01/2014 Surgery   Left breast lumpectomy ([facility]): Grade 1, DCIS, spanning 2.3 cm, 1 mm margin, ER 100%, PR 96%   07/31/2014 - 08/28/2014 Radiation Therapy   Adjuvant RT completed  Katheryn Pandy). Left breast: Total dose 42.5 Gy over 17 fractions. Left breast boost: Total dose 7.5 Gy over 3 fractions.    09/14/2014 - 01/13/2020 Anti-estrogen oral therapy   Anastrazole 1mg  daily. Planned duration of treatment: 5 years [place]). Completed in 01/2020.    09/25/2014 Survivorship   Survivorship Care Plan given to patient and reviewed with her in person.    03/02/2021 Imaging   CT CAP  IMPRESSION: 1. No findings of active/recurrent malignancy. Partial right hemicolectomy. 2. Endometrial stripe remains mildly thickened, but endometrial biopsy in May was negative for malignancy. 3. Progressive endplate sclerosis and endplate irregularity at T2-3, probably due to degenerative endplate findings. If the has referable upper thoracic pain/symptoms then thoracic spine MRI could be used for further workup. 4. Other imaging findings of potential clinical significance: Mild cardiomegaly. Aortic Atherosclerosis (ICD10-I70.0). Mild mitral valve calcification. Postoperative findings in the left breast with adjacent radiation port anteriorly in the left upper lobe. Tiny pulmonary nodules in the left lower lobe are unchanged from earliest available comparison of 10/17/2019 and probably benign although may merit surveillance. Multilevel lumbar impingement. Mild pelvic floor laxity.   Malignant neoplasm of ascending colon (HCC)  09/19/2019 Imaging   CT AP W contrast 09/19/19  IMPRESSION Fullness in the cecum, cannot exclude a mass. No evidence for metastatic disease is identified.    09/24/2019 Procedure   Colonoscopy by Dr Ardine Beckwith 09/24/19 IMPRESSION 1. The colon was redundant  2. Mild diverticulosis was noted through the entire examined colon 3. Single 12mm polyp was found in the ascending colon; polypectomy was performed using snare cautery and biopsy forceps 4. Mild diverticulosis was notes  in the descending colon and sigmoid colon.  5. Single polyp was found in the sigmoid colon,  polypectomy was performed with cold forceps.  6. Single polyp was found in the rectosigmoid colon; polypectomy was performed with cold snare  7. Small internal hemorrhoids  8. Large mass was found at the cecum; multiple biopsies of the area were performed using cold forceps; injection (tattooing) was performed distal to the mass.    09/24/2019 Initial Biopsy   INTERPRETATION AND DIAGNOSIS:  A. Cecum, biopsy:  Invasive moderately differentiated adenocarcinoma.  see comment  B. Polyp @ ascending colon, polypectomy:  Tubular Adenoma  C. Polyp @ sigmoid colon Polypectomy:  hyperplastic polyp.  D. Polyp @ rectosigmoid colon, Polypectomy:  Hyperplastic Polyp      10/16/2019 Imaging   CT Chest IMPRESSION: 1. Multiple small pulmonary nodules measuring 5 mm or less in size in the lungs. These are nonspecific and are typically considered statistically likely benign. However, given the patient's history of primary malignancy, close attention on follow-up studies is recommended to ensure stability. 2. Aortic atherosclerosis, in addition to right coronary artery disease. Assessment for potential risk factor modification, dietary therapy or pharmacologic therapy may be warranted, if clinically indicated. 3. There are calcifications of the aortic valve and mitral annulus. Echocardiographic correlation for evaluation of potential valvular dysfunction may be warranted if clinically indicated. 4. Small hiatal hernia.   Aortic Atherosclerosis (ICD10-I70.0).   11/01/2019 Initial Diagnosis   Colon cancer (HCC)   11/01/2019 Surgery   LAPAROSCOPIC PARTIAL COLECTOMY by Dr Andy Bannister and Dr Hershell Lose   11/01/2019 Pathology Results   FINAL MICROSCOPIC DIAGNOSIS:   A. COLON, PROXIMAL RIGHT, COLECTOMY:  - Invasive colonic adenocarcinoma, 5 cm.  - Tumor invades through the muscularis propria into pericolonic tissues.   - Margins of resection are not involved.  - Metastatic carcinoma in (5) of (13) lymph nodes.   - See oncology table.    MSI Stable  Mismatch repair normal  MLH1 - Preserved nuclear expression (greater 50% tumor expression) MSH2 - Preserved nuclear expression (greater 50% tumor expression) MSH6 - Preserved nuclear expression (greater 50% tumor expression) PMS2 - Preserved nuclear expression (greater 50% tumor expression)   11/01/2019 Cancer Staging   Staging form: Colon and Rectum, AJCC 8th Edition - Pathologic stage from 11/01/2019: Stage IIIB (pT3, pN2a, cM0) - Signed by Sonja [place]. [place], MD on 11/29/2019   12/10/2019 Procedure   PAC placed 12/10/19   12/17/2019 - 06/01/2020 Chemotherapy   FOLFOX q2weeks starting in 2 weeks starting 12/17/19. Held 01/27/20-02/10/20 due to b/l PE. Oxaliplatin  held C11-12 due to neuropathy. Completed on 06/01/20   03/02/2021 Imaging   CT CAP  IMPRESSION: 1. No findings of active/recurrent malignancy. Partial right hemicolectomy. 2. Endometrial stripe remains mildly thickened, but endometrial biopsy in May was negative for malignancy. 3. Progressive endplate sclerosis and endplate irregularity at T2-3, probably due to degenerative endplate findings. If the has referable upper thoracic pain/symptoms then thoracic spine MRI could be used for further workup. 4. Other imaging findings of potential clinical significance: Mild cardiomegaly. Aortic Atherosclerosis (ICD10-I70.0). Mild mitral valve calcification. Postoperative findings in the left breast with adjacent radiation port anteriorly in the left upper lobe. Tiny pulmonary nodules in the left lower lobe are unchanged from earliest available comparison of 10/17/2019 and probably benign although may merit surveillance. Multilevel lumbar impingement. Mild pelvic floor laxity.   08/26/2021 Imaging   EXAM: CT CHEST, ABDOMEN, AND PELVIS WITH CONTRAST  IMPRESSION: 1. Stable examination without new or progressive findings to suggest  local recurrence or metastatic disease within the chest, abdomen, or pelvis. 2.  Hepatomegaly with hepatic steatosis. 3. Sigmoid colonic diverticulosis without findings of acute diverticulitis. 4. Similar prominent endplate sclerosis and irregularity at T2-T3 is most consistent with Modic type endplate degenerative changes. However, if patient has referable upper thoracic pain consider further workup with thoracic spine MRI. 5. Similar thickening of the endometrial stripe measuring up to 8 mm, which was previously biopsied with results negative for malignancy. 6. Aortic Atherosclerosis (ICD10-I70.0).   11/10/2021 PET scan   IMPRESSION: 1. LEFT ovary is increased in size and is intensely hypermetabolic. While physiologic hypermetabolic ovarian tissue is not uncommon, the enlargement and asymmetric activity warrants further evaluation. Consider contrast pelvic MRI vs tissue sampling. 2. No evidence of metastatic colorectal carcinoma otherwise. 3. Post RIGHT hemicolectomy anatomy. 4. Evidence of radiation change in the LEFT upper lobe (remote breast cancer).     12/22/2021 Relapse/Recurrence    FINAL MICROSCOPIC DIAGNOSIS:   A. LEFT OVARY AND FALLOPIAN TUBE, SALPINGO OOPHORECTOMY:  - Metastatic moderately differentiated colonic adenocarcinoma involving left ovary  - Focal ovarian stromal calcification  - Segment of benign fallopian tube   B. UTERUS WITH RIGHT FALLOPIAN TUBE AND OVARY, HYSTERECTOMY AND SALPINGO-OOPHORECTOMY:  - Metastatic moderately differentiated colonic adenocarcinoma involving right ovary  - Focal invasive extensive adenomyosis  - Benign endometrial polyps  - Benign proliferative phase endometrium  - Hydrosalpinx of right fallopian tube  - Focal ovarian stromal calcification   C. PERITONEAL DEPOSITS, ANTERIOR CUL DE SAC, BIOPSY:  - Metastatic mucinous adenocarcinoma, consistent with colorectal primary    COMMENT:  Immunohistochemical stains show that the tumor cells are positive for CK20 and CDX2 while they are negative for CK7 and PAX8,  consistent with above interpretation.    01/24/2022 - 03/23/2022 Chemotherapy   Patient is on Treatment Plan : COLORECTAL FOLFOX q14d x 3 months     04/01/2022 Imaging   CT AP IMPRESSION: 1. New omental metastatic disease. 2. Vague hypoattenuating lesion in the inferior right hepatic lobe is new and also worrisome for metastatic disease. 3. Similar small right lower lobe nodules. Recommend continued attention on follow-up. 4. Steatotic enlarged liver. 5.  Aortic atherosclerosis (ICD10-I70.0).   04/06/2022 -  Chemotherapy   Patient is on Treatment Plan : COLORECTAL FOLFIRI + Bevacizumab  q14d     04/21/2022 Imaging    IMPRESSION: 1. Stable chest CT. No evidence of metastatic disease. 2. Stable small pulmonary nodules, considered benign based on stability. 3. Stable postsurgical changes in the left breast and anterior left upper lobe radiation changes. 4. Aortic Atherosclerosis (ICD10-I70.0) and Emphysema (ICD10-J43.9).   04/21/2022 Imaging    IMPRESSION: 1. Small enhancing lesion inferiorly in the right hepatic lobe is typical of metastatic disease and stable from recent abdominal CT. No other definite liver lesions are identified. Mild hepatic steatosis. 2. No other significant abdominal findings. 3. Omental nodularity seen on CT is not well visualized by MRI.    Imaging     06/23/2022 Imaging    IMPRESSION: 1. Hypodense lesion of the inferior right lobe of the liver, hepatic segment VI is diminished in size, consistent with treatment response. 2. Tiny peritoneal and omental nodules identified by prior examination are diminished in size, consistent with treatment response. 3. Multiple tiny pulmonary nodules unchanged, most likely benign and incidental, however continued attention on follow-up warranted in the setting of known metastatic disease. 4. No evidence of new metastatic disease in the chest, abdomen, or pelvis. 5. Status post partial right hemicolectomy  and  reanastomosis. 6. Diffuse mosaic attenuation of the airspaces, consistent with small airways disease. 7. Hepatomegaly.      Discussed the use of AI scribe software for clinical note transcription with the patient, who gave verbal consent to proceed.  History of Present Illness   A 78 year old patient with a history of metastatic colon cancer presents for a follow-up visit. The patient reports a busy weekend spent with her grandchildren and daughters. She notes a slight weight gain, which she attributes to improved eating habits over the past week. She denies any adverse effects from her chemotherapy treatments and reports regular bowel movements. The patient also mentions neuropathy, which she describes as progressively worsening but stable since her last visit. She denies any issues with hand function or balance.  The patient's primary concern is a stabbing pain in her lower back, which she has been experiencing for about six weeks. The pain is inconsistent and localized to one spot. It does not radiate to the legs. The patient describes the pain as a stabbing sensation that can be triggered by certain movements, such as getting out of bed or standing at the sink. The pain can reach a severity of 12 out of 10 but typically hovers around a 5. The patient finds some relief with a heating pad, car seat heater, and ibuprofen.  The patient also mentions a history of blood clots and is currently taking Eliquis . She reports occasional nosebleeds when blowing her nose but describes them as minor. The patient also experiences occasional sores along her gums and a sensation akin to a sore throat, which she manages with mouthwash. She denies any significant swelling, noting only minor swelling in one leg when sitting with her feet dangling.         All other systems were reviewed with the patient and are negative.  MEDICAL HISTORY:  Past Medical History:  Diagnosis Date   Aortic atherosclerosis (HCC)     Arthritis    feet, lower back   Basal cell carcinoma    arm   Breast cancer of upper-outer quadrant of left female breast (HCC) 06/04/2014   Cataract    immature on the left   Colon cancer (HCC) 08/2019   Diabetes mellitus without complication (HCC)    Diverticulosis    Dizziness    > 19yrs ago;took Antivert    Family history of anesthesia complication    sister slow to wake up with anesthesia   Family history of breast cancer    Family history of colon cancer    Family history of uterine cancer    GERD (gastroesophageal reflux disease)    takes occasional TUMs   History of bronchitis    > 2yrs ago   History of colon polyps    History of hiatal hernia    Small noted on CT   History of pulmonary embolus (PE)    Hypertension    takes Losartan  daily and HCTZ   Iron deficiency anemia    Joint pain    Numbness    to toes on each foot   Peripheral neuropathy    feet and toes   Personal history of radiation therapy    Pulmonary nodules    Noted on CT   Radiation 07/31/14-08/28/14   Left Breast 20 fxs   Seasonal allergies    takes Claritin prn   Urinary frequency    Vitamin D deficiency    takes VIt D daily    SURGICAL HISTORY: Past Surgical  History:  Procedure Laterality Date   BREAST BIOPSY Bilateral    BREAST LUMPECTOMY Left    BREAST LUMPECTOMY WITH RADIOACTIVE SEED LOCALIZATION Left 07/01/2014   Procedure: LEFT BREAST LUMPECTOMY WITH RADIOACTIVE SEED LOCALIZATION;  Surgeon: Ayesha Lente, MD;  Location: [facility];  Service: General;  Laterality: Left;   CATARACT EXTRACTION Right    COLONOSCOPY  09/24/2019   [place]   LAPAROSCOPIC PARTIAL COLECTOMY N/A 11/01/2019   Procedure: LAPAROSCOPIC PARTIAL COLECTOMY;  Surgeon: [name], MD;  Location: [facility];  Service: General;  Laterality: N/A;   POLYPECTOMY     PORTACATH PLACEMENT N/A 12/10/2019   Procedure: INSERTION PORT-A-CATH ULTRASOUND GUIDED IN RIGHT IJ;  Surgeon: [name], MD;   Location: [facility];  Service: General;  Laterality: N/A;   ROBOTIC ASSISTED TOTAL HYSTERECTOMY Bilateral 12/22/2021   Procedure: XI ROBOTIC ASSISTED TOTAL HYSTERECTOMY WITH BILATERAL SALPINGO OOPHORECTOMY;  Surgeon: Suzi Essex, MD;  Location: [facility];  Service: Gynecology;  Laterality: Bilateral;   TOTAL KNEE ARTHROPLASTY Left 10/24/2012   Procedure: TOTAL KNEE ARTHROPLASTY;  Surgeon: Ilean Mall, MD;  Location: [facility];  Service: Orthopedics;  Laterality: Left;   TOTAL KNEE ARTHROPLASTY Right 01/09/2013   Procedure: TOTAL KNEE ARTHROPLASTY;  Surgeon: Ilean Mall, MD;  Location: [facility];  Service: Orthopedics;  Laterality: Right;   TUBAL LIGATION      I have reviewed the social history and family history with the patient and they are unchanged from previous note.  ALLERGIES:  is allergic to oxycodone .  MEDICATIONS:  Current Outpatient Medications  Medication Sig Dispense Refill   apixaban  (ELIQUIS ) 5 MG TABS tablet Take 1 tablet (5 mg total) by mouth 2 (two) times daily. 60 tablet 5   b complex vitamins capsule Take 1 capsule by mouth daily.     Cholecalciferol (VITAMIN D) 2000 UNITS CAPS Take 2,000 Units by mouth daily.      ibuprofen (ADVIL) 200 MG tablet Take 200 mg by mouth daily as needed (pain).     lidocaine -prilocaine  (EMLA ) cream Apply 1 Application topically as needed. 30 g 1   loratadine (CLARITIN) 10 MG tablet Take 10 mg by mouth daily as needed for allergies.     losartan  (COZAAR ) 25 MG tablet Take 1 tablet (25 mg total) by mouth daily. 90 tablet 3   metFORMIN  (GLUCOPHAGE ) 500 MG tablet Take 1 tablet (500 mg total) by mouth in the morning and at bedtime. 180 tablet 3   Multiple Vitamins-Minerals (MULTIVITAMIN WITH MINERALS) tablet Take 1 tablet by mouth daily.     omeprazole (PRILOSEC) 20 MG capsule Take 20 mg by mouth daily before breakfast.      ondansetron  (ZOFRAN ) 8 MG tablet Take 1 tablet (8 mg total) by mouth every 8 (eight) hours as needed for nausea or vomiting.  Start on the third day after chemotherapy. 30 tablet 1   potassium chloride  (KLOR-CON ) 10 MEQ tablet Take 1 tablet by mouth twice daily 60 tablet 0   pravastatin  (PRAVACHOL ) 10 MG tablet Take 1 tablet (10 mg total) by mouth daily. 90 tablet 3   pregabalin  (LYRICA ) 25 MG capsule Take 1 capsule (25 mg total) by mouth every morning. Take in addition to 50 mg at bedtime 60 capsule 2   pregabalin  (LYRICA ) 50 MG capsule Take 1 capsule (50 mg total) by mouth at bedtime. Take in addition to 25 mg in the morning 60 capsule 2   prochlorperazine  (COMPAZINE ) 10 MG tablet Take 1 tablet (10 mg total) by mouth  every 6 (six) hours as needed for nausea or vomiting. 30 tablet 1   triamcinolone  ointment (KENALOG ) 0.5 % Apply 1 Application topically 2 (two) times daily. 30 g 0   No current facility-administered medications for this visit.   Facility-Administered Medications Ordered in Other Visits  Medication Dose Route Frequency Provider Last Rate Last Admin   fluorouracil  (ADRUCIL ) 5,000 mg in sodium chloride  0.9 % 150 mL chemo infusion  2,400 mg/m2 (Treatment Plan Recorded) Intravenous 1 day or 1 dose Sonja Notus, MD   Infusion Verify at 06/12/23 1240   heparin  lock flush 100 unit/mL  500 Units Intracatheter Once PRN Sonja [place], MD       sodium chloride  flush (NS) 0.9 % injection 10 mL  10 mL Intracatheter PRN Sonja [place], MD   10 mL at 06/12/23 1234    PHYSICAL EXAMINATION: ECOG PERFORMANCE STATUS: 1 - Symptomatic but completely ambulatory  Vitals:   06/12/23 0837  BP: (!) 150/72  Pulse: 65  Resp: 16  Temp: 97.6 F (36.4 C)  SpO2: 99%   Wt Readings from Last 3 Encounters:  06/12/23 98.3 kg  05/30/23 96.6 kg  05/29/23 96.4 kg     GENERAL:alert, no distress and comfortable SKIN: skin color, texture, turgor are normal, no rashes or significant lesions EYES: normal, Conjunctiva are pink and non-injected, sclera clear NECK: supple, thyroid normal size, non-tender, without nodularity LYMPH:  no palpable  lymphadenopathy in the cervical, axillary  LUNGS: clear to auscultation and percussion with normal breathing effort HEART: regular rate & rhythm and no murmurs and no lower extremity edema ABDOMEN:abdomen soft, non-tender and normal bowel sounds Musculoskeletal:no cyanosis of digits and no clubbing  NEURO: alert & oriented x 3 with fluent speech, no focal motor/sensory deficits    LABORATORY DATA:  I have reviewed the data as listed    Latest Ref Rng & Units 06/12/2023    8:20 AM 05/29/2023    8:19 AM 05/15/2023    8:19 AM  CBC  WBC 4.0 - 10.5 K/uL 14.8  15.1  7.6   Hemoglobin 12.0 - 15.0 g/dL 16.1  09.6  04.5   Hematocrit 36.0 - 46.0 % 32.9  34.5  33.1   Platelets 150 - 400 K/uL 240  242  203         Latest Ref Rng & Units 06/12/2023    8:20 AM 05/30/2023    2:09 PM 05/29/2023    8:19 AM  CMP  Glucose 70 - 99 mg/dL 409  811  914   BUN 8 - 23 mg/dL 17  24  17    Creatinine 0.44 - 1.00 mg/dL 7.82  9.56  2.13   Sodium 135 - 145 mmol/L 141  139  140   Potassium 3.5 - 5.1 mmol/L 4.4  4.2  4.5   Chloride 98 - 111 mmol/L 108  106  107   CO2 22 - 32 mmol/L 26  24  26    Calcium  8.9 - 10.3 mg/dL 9.2  9.3  9.4   Total Protein 6.5 - 8.1 g/dL 6.3   6.6   Total Bilirubin 0.0 - 1.2 mg/dL 0.6   0.7   Alkaline Phos 38 - 126 U/L 140   180   AST 15 - 41 U/L 22   45   ALT 0 - 44 U/L 16   41       RADIOGRAPHIC STUDIES: I have personally reviewed the radiological images as listed and agreed with the findings in the report. No  results found.    Orders Placed This Encounter  Procedures   CBC with Differential (Cancer Center Only)    Standing Status:   Future    Expected Date:   07/24/2023    Expiration Date:   07/23/2024   CMP (Cancer Center only)    Standing Status:   Future    Expected Date:   07/24/2023    Expiration Date:   07/23/2024   All questions were answered. The patient knows to call the clinic with any problems, questions or concerns. No barriers to learning was  detected. The total time spent in the appointment was 25 minutes.     Sonja , MD 06/12/2023

## 2023-06-12 NOTE — Patient Instructions (Signed)
 [facility] - A DEPT OF [facility]  Discharge Instructions: Thank you for choosing [facility] to provide your oncology and hematology care.   If you have a lab appointment with the Cancer Center, please go directly to the Cancer Center and check in at the registration area.   Wear comfortable clothing and clothing appropriate for easy access to any Portacath or PICC line.   We strive to give you quality time with your provider. You may need to reschedule your appointment if you arrive late (15 or more minutes).  Arriving late affects you and other patients whose appointments are after yours.  Also, if you miss three or more appointments without notifying the office, you may be dismissed from the clinic at the provider's discretion.      For prescription refill requests, have your pharmacy contact our office and allow 72 hours for refills to be completed.    Today you received the following chemotherapy and/or immunotherapy agents: MVASI/Irinotecan/Leucovorin/Fluorouracil      To help prevent nausea and vomiting after your treatment, we encourage you to take your nausea medication as directed.  BELOW ARE SYMPTOMS THAT SHOULD BE REPORTED IMMEDIATELY: *FEVER GREATER THAN 100.4 F (38 C) OR HIGHER *CHILLS OR SWEATING *NAUSEA AND VOMITING THAT IS NOT CONTROLLED WITH YOUR NAUSEA MEDICATION *UNUSUAL SHORTNESS OF BREATH *UNUSUAL BRUISING OR BLEEDING *URINARY PROBLEMS (pain or burning when urinating, or frequent urination) *BOWEL PROBLEMS (unusual diarrhea, constipation, pain near the anus) TENDERNESS IN MOUTH AND THROAT WITH OR WITHOUT PRESENCE OF ULCERS (sore throat, sores in mouth, or a toothache) UNUSUAL RASH, SWELLING OR PAIN  UNUSUAL VAGINAL DISCHARGE OR ITCHING   Items with * indicate a potential emergency and should be followed up as soon as possible or go to the Emergency Department if any problems should occur.  Please show the  CHEMOTHERAPY ALERT CARD or IMMUNOTHERAPY ALERT CARD at check-in to the Emergency Department and triage nurse.  Should you have questions after your visit or need to cancel or reschedule your appointment, please contact [facility] - A DEPT OF [facility]  Dept: [phone]  and follow the prompts.  Office hours are 8:00 a.m. to 4:30 p.m. Monday - Friday. Please note that voicemails left after 4:00 p.m. may not be returned until the following business day.  We are closed weekends and major holidays. You have access to a nurse at all times for urgent questions. Please call the main number to the clinic Dept: [phone] and follow the prompts.   For any non-urgent questions, you may also contact your provider using MyChart. We now offer e-Visits for anyone 46 and older to request care online for non-urgent symptoms. For details visit [email or web address].   Also download the MyChart app! Go to the app store, search "MyChart", open the app, select [place], and log in with your MyChart username and password.

## 2023-06-13 ENCOUNTER — Encounter: Payer: Self-pay | Admitting: Nurse Practitioner

## 2023-06-14 ENCOUNTER — Inpatient Hospital Stay: Payer: Medicare Other

## 2023-06-14 VITALS — BP 130/52 | HR 64 | Temp 98.6°F | Resp 18

## 2023-06-14 DIAGNOSIS — C7963 Secondary malignant neoplasm of bilateral ovaries: Secondary | ICD-10-CM | POA: Diagnosis not present

## 2023-06-14 DIAGNOSIS — Z90722 Acquired absence of ovaries, bilateral: Secondary | ICD-10-CM | POA: Diagnosis not present

## 2023-06-14 DIAGNOSIS — C182 Malignant neoplasm of ascending colon: Secondary | ICD-10-CM

## 2023-06-14 DIAGNOSIS — I10 Essential (primary) hypertension: Secondary | ICD-10-CM | POA: Diagnosis not present

## 2023-06-14 DIAGNOSIS — R040 Epistaxis: Secondary | ICD-10-CM | POA: Diagnosis not present

## 2023-06-14 DIAGNOSIS — Z5111 Encounter for antineoplastic chemotherapy: Secondary | ICD-10-CM | POA: Diagnosis not present

## 2023-06-14 DIAGNOSIS — Z90710 Acquired absence of both cervix and uterus: Secondary | ICD-10-CM | POA: Diagnosis not present

## 2023-06-14 DIAGNOSIS — Z5189 Encounter for other specified aftercare: Secondary | ICD-10-CM | POA: Diagnosis not present

## 2023-06-14 DIAGNOSIS — R918 Other nonspecific abnormal finding of lung field: Secondary | ICD-10-CM | POA: Diagnosis not present

## 2023-06-14 DIAGNOSIS — C787 Secondary malignant neoplasm of liver and intrahepatic bile duct: Secondary | ICD-10-CM | POA: Diagnosis not present

## 2023-06-14 DIAGNOSIS — M545 Low back pain, unspecified: Secondary | ICD-10-CM | POA: Diagnosis not present

## 2023-06-14 MED ORDER — HEPARIN SOD (PORK) LOCK FLUSH 100 UNIT/ML IV SOLN
500.0000 [IU] | Freq: Once | INTRAVENOUS | Status: AC | PRN
Start: 2023-06-14 — End: 2023-06-14
  Administered 2023-06-14: 500 [IU]

## 2023-06-14 MED ORDER — SODIUM CHLORIDE 0.9% FLUSH
10.0000 mL | INTRAVENOUS | Status: DC | PRN
Start: 2023-06-14 — End: 2023-06-14
  Administered 2023-06-14: 10 mL

## 2023-06-14 MED ORDER — PEGFILGRASTIM-CBQV 6 MG/0.6ML ~~LOC~~ SOSY
6.0000 mg | PREFILLED_SYRINGE | Freq: Once | SUBCUTANEOUS | Status: AC
  Administered 2023-06-14: 6 mg via SUBCUTANEOUS
  Filled 2023-06-14: qty 0.6

## 2023-06-14 NOTE — Patient Instructions (Addendum)
Pegfilgrastim Injection What is this medication? PEGFILGRASTIM (PEG fil gra stim) lowers the risk of infection in people who are receiving chemotherapy. It works by Systems analyst make more white blood cells, which protects your body from infection. It may also be used to help people who have been exposed to high doses of radiation. This medicine may be used for other purposes; ask your health care provider or pharmacist if you have questions. COMMON BRAND NAME(S): Cherly Hensen, Neulasta, Nyvepria, Stimufend, UDENYCA, UDENYCA ONBODY, Ziextenzo What should I tell my care team before I take this medication? They need to know if you have any of these conditions: Kidney disease Latex allergy Ongoing radiation therapy Sickle cell disease Skin reactions to acrylic adhesives (On-Body Injector only) An unusual or allergic reaction to pegfilgrastim, filgrastim, other medications, foods, dyes, or preservatives Pregnant or trying to get pregnant Breast-feeding How should I use this medication? This medication is for injection under the skin. If you get this medication at home, you will be taught how to prepare and give the pre-filled syringe or how to use the On-body Injector. Refer to the patient Instructions for Use for detailed instructions. Use exactly as directed. Tell your care team immediately if you suspect that the On-body Injector may not have performed as intended or if you suspect the use of the On-body Injector resulted in a missed or partial dose. It is important that you put your used needles and syringes in a special sharps container. Do not put them in a trash can. If you do not have a sharps container, call your pharmacist or care team to get one. Talk to your care team about the use of this medication in children. While this medication may be prescribed for selected conditions, precautions do apply. Overdosage: If you think you have taken too much of this medicine contact a poison  control center or emergency room at once. NOTE: This medicine is only for you. Do not share this medicine with others. What if I miss a dose? It is important not to miss your dose. Call your care team if you miss your dose. If you miss a dose due to an On-body Injector failure or leakage, a new dose should be administered as soon as possible using a single prefilled syringe for manual use. What may interact with this medication? Interactions have not been studied. This list may not describe all possible interactions. Give your health care provider a list of all the medicines, herbs, non-prescription drugs, or dietary supplements you use. Also tell them if you smoke, drink alcohol, or use illegal drugs. Some items may interact with your medicine. What should I watch for while using this medication? Your condition will be monitored carefully while you are receiving this medication. You may need blood work done while you are taking this medication. Talk to your care team about your risk of cancer. You may be more at risk for certain types of cancer if you take this medication. If you are going to need a MRI, CT scan, or other procedure, tell your care team that you are using this medication (On-Body Injector only). What side effects may I notice from receiving this medication? Side effects that you should report to your care team as soon as possible: Allergic reactions--skin rash, itching, hives, swelling of the face, lips, tongue, or throat Capillary leak syndrome--stomach or muscle pain, unusual weakness or fatigue, feeling faint or lightheaded, decrease in the amount of urine, swelling of the ankles, hands, or  feet, trouble breathing High white blood cell level--fever, fatigue, trouble breathing, night sweats, change in vision, weight loss Inflammation of the aorta--fever, fatigue, back, chest, or stomach pain, severe headache Kidney injury (glomerulonephritis)--decrease in the amount of urine, red  or dark brown urine, foamy or bubbly urine, swelling of the ankles, hands, or feet Shortness of breath or trouble breathing Spleen injury--pain in upper left stomach or shoulder Unusual bruising or bleeding Side effects that usually do not require medical attention (report to your care team if they continue or are bothersome): Bone pain Pain in the hands or feet This list may not describe all possible side effects. Call your doctor for medical advice about side effects. You may report side effects to FDA at 1-800-FDA-1088. Where should I keep my medication? Keep out of the reach of children. If you are using this medication at home, you will be instructed on how to store it. Throw away any unused medication after the expiration date on the label. NOTE: This sheet is a summary. It may not cover all possible information. If you have questions about this medicine, talk to your doctor, pharmacist, or health care provider.  2024 Elsevier/Gold Standard (2021-03-19 00:00:00)  Implanted [facility] Guide An implanted port is a device that is placed under the skin. It is usually placed in the chest. The device may vary based on the need. Implanted ports can be used to give IV medicine, to take blood, or to give fluids. You may have an implanted port if: You need IV medicine that would be irritating to the small veins in your hands or arms. You need IV medicines, such as chemotherapy, for a long period of time. You need IV nutrition for a long period of time. You may have fewer limitations when using a port than you would if you used other types of long-term IVs. You will also likely be able to return to normal activities after your incision heals. An implanted port has two main parts: Reservoir. The reservoir is the part where a needle is inserted to give medicines or draw blood. The reservoir is round. After the port is placed, it appears as a small, raised area under your skin. Catheter. The catheter  is a small, thin tube that connects the reservoir to a vein. Medicine that is inserted into the reservoir goes into the catheter and then into the vein. How is my port accessed? To access your port: A numbing cream may be placed on the skin over the port site. Your health care provider will put on a mask and sterile gloves. The skin over your port will be cleaned carefully with a germ-killing soap and allowed to dry. Your health care provider will gently pinch the port and insert a needle into it. Your health care provider will check for a blood return to make sure the port is in the vein and is still working (patent). If your port needs to remain accessed to get medicine continuously (constant infusion), your health care provider will place a clear bandage (dressing) over the needle site. The dressing and needle will need to be changed every week, or as told by your health care provider. What is flushing? Flushing helps keep the port working. Follow instructions from your health care provider about how and when to flush the port. Ports are usually flushed with saline solution or a medicine called heparin. The need for flushing will depend on how the port is used: If the port is only used  from time to time to give medicines or draw blood, the port may need to be flushed: Before and after medicines have been given. Before and after blood has been drawn. As part of routine maintenance. Flushing may be recommended every 4-6 weeks. If a constant infusion is running, the port may not need to be flushed. Throw away any syringes in a disposal container that is meant for sharp items (sharps container). You can buy a sharps container from a pharmacy, or you can make one by using an empty hard plastic bottle with a cover. How long will my port stay implanted? The port can stay in for as long as your health care provider thinks it is needed. When it is time for the port to come out, a surgery will be done to  remove it. The surgery will be similar to the procedure that was done to put the port in. Follow these instructions at home: Caring for your port and port site Flush your port as told by your health care provider. If you need an infusion over several days, follow instructions from your health care provider about how to take care of your port site. Make sure you: Change your dressing as told by your health care provider. Wash your hands with soap and water for at least 20 seconds before and after you change your dressing. If soap and water are not available, use alcohol-based hand sanitizer. Place any used dressings or infusion bags into a plastic bag. Throw that bag in the trash. Keep the dressing that covers the needle clean and dry. Do not get it wet. Do not use scissors or sharp objects near the infusion tubing. Keep any external tubes clamped, unless they are being used. Check your port site every day for signs of infection. Check for: Redness, swelling, or pain. Fluid or blood. Warmth. Pus or a bad smell. Protect the skin around the port site. Avoid wearing bra straps that rub or irritate the site. Protect the skin around your port from seat belts. Place a soft pad over your chest if needed. Bathe or shower as told by your health care provider. The site may get wet as long as you are not actively receiving an infusion. General instructions  Return to your normal activities as told by your health care provider. Ask your health care provider what activities are safe for you. Carry a medical alert card or wear a medical alert bracelet at all times. This will let health care providers know that you have an implanted port in case of an emergency. Where to find more information American Cancer Society: www.cancer.org American Society of Clinical Oncology: www.cancer.net Contact a health care provider if: You have a fever or chills. You have redness, swelling, or pain at the port  site. You have fluid or blood coming from your port site. Your incision feels warm to the touch. You have pus or a bad smell coming from the port site. Summary Implanted ports are usually placed in the chest for long-term IV access. Follow instructions from your health care provider about flushing the port and changing bandages (dressings). Take care of the area around your port by avoiding clothing that puts pressure on the area, and by watching for signs of infection. Protect the skin around your port from seat belts. Place a soft pad over your chest if needed. Contact a health care provider if you have a fever or you have redness, swelling, pain, fluid, or a bad smell at  the port site. This information is not intended to replace advice given to you by your health care provider. Make sure you discuss any questions you have with your health care provider. Document Revised: 10/20/2020 Document Reviewed: 10/20/2020 Elsevier Patient Education  2024 [employer].

## 2023-06-19 ENCOUNTER — Encounter (HOSPITAL_COMMUNITY): Payer: Self-pay

## 2023-06-19 ENCOUNTER — Ambulatory Visit (HOSPITAL_COMMUNITY)
Admission: RE | Admit: 2023-06-19 | Discharge: 2023-06-19 | Disposition: A | Discharge status: Home / Self Care | Payer: Medicare Other | Source: Ambulatory Visit | Attending: Hematology | Admitting: Hematology

## 2023-06-19 DIAGNOSIS — C182 Malignant neoplasm of ascending colon: Secondary | ICD-10-CM | POA: Insufficient documentation

## 2023-06-19 DIAGNOSIS — I700 Atherosclerosis of aorta: Secondary | ICD-10-CM | POA: Diagnosis not present

## 2023-06-19 DIAGNOSIS — R160 Hepatomegaly, not elsewhere classified: Secondary | ICD-10-CM | POA: Diagnosis not present

## 2023-06-19 DIAGNOSIS — C189 Malignant neoplasm of colon, unspecified: Secondary | ICD-10-CM | POA: Diagnosis not present

## 2023-06-19 DIAGNOSIS — K760 Fatty (change of) liver, not elsewhere classified: Secondary | ICD-10-CM | POA: Diagnosis not present

## 2023-06-19 MED ORDER — IOHEXOL 300 MG/ML  SOLN
30.0000 mL | Freq: Once | INTRAMUSCULAR | Status: AC | PRN
  Administered 2023-06-19: 30 mL via ORAL

## 2023-06-19 MED ORDER — IOHEXOL 300 MG/ML  SOLN
100.0000 mL | Freq: Once | INTRAMUSCULAR | Status: AC | PRN
  Administered 2023-06-19: 100 mL via INTRAVENOUS

## 2023-06-19 MED ORDER — HEPARIN SOD (PORK) LOCK FLUSH 100 UNIT/ML IV SOLN
INTRAVENOUS | Status: AC
  Filled 2023-06-19: qty 5

## 2023-06-19 MED ORDER — HEPARIN SOD (PORK) LOCK FLUSH 100 UNIT/ML IV SOLN
500.0000 [IU] | Freq: Once | INTRAVENOUS | Status: AC
  Administered 2023-06-19: 500 [IU] via INTRAVENOUS

## 2023-06-20 ENCOUNTER — Other Ambulatory Visit: Payer: Self-pay | Admitting: Hematology

## 2023-06-20 DIAGNOSIS — R200 Anesthesia of skin: Secondary | ICD-10-CM

## 2023-06-23 NOTE — Progress Notes (Signed)
 Stable CT. Upcoming appointment to review 06/26/2023.

## 2023-06-26 ENCOUNTER — Inpatient Hospital Stay: Payer: Medicare Other

## 2023-06-26 ENCOUNTER — Encounter: Payer: Self-pay | Admitting: Nurse Practitioner

## 2023-06-26 ENCOUNTER — Inpatient Hospital Stay: Payer: Medicare Other | Admitting: Nurse Practitioner

## 2023-06-26 VITALS — BP 151/70 | HR 63 | Temp 98.2°F | Resp 18 | Ht 65.5 in | Wt 216.2 lb

## 2023-06-26 DIAGNOSIS — C182 Malignant neoplasm of ascending colon: Secondary | ICD-10-CM

## 2023-06-26 DIAGNOSIS — Z95828 Presence of other vascular implants and grafts: Secondary | ICD-10-CM

## 2023-06-26 DIAGNOSIS — Z90722 Acquired absence of ovaries, bilateral: Secondary | ICD-10-CM | POA: Diagnosis not present

## 2023-06-26 DIAGNOSIS — M545 Low back pain, unspecified: Secondary | ICD-10-CM | POA: Diagnosis not present

## 2023-06-26 DIAGNOSIS — C787 Secondary malignant neoplasm of liver and intrahepatic bile duct: Secondary | ICD-10-CM | POA: Diagnosis not present

## 2023-06-26 DIAGNOSIS — Z5189 Encounter for other specified aftercare: Secondary | ICD-10-CM | POA: Diagnosis not present

## 2023-06-26 DIAGNOSIS — R918 Other nonspecific abnormal finding of lung field: Secondary | ICD-10-CM | POA: Diagnosis not present

## 2023-06-26 DIAGNOSIS — Z90710 Acquired absence of both cervix and uterus: Secondary | ICD-10-CM | POA: Diagnosis not present

## 2023-06-26 DIAGNOSIS — I10 Essential (primary) hypertension: Secondary | ICD-10-CM | POA: Diagnosis not present

## 2023-06-26 DIAGNOSIS — R040 Epistaxis: Secondary | ICD-10-CM | POA: Diagnosis not present

## 2023-06-26 DIAGNOSIS — C7963 Secondary malignant neoplasm of bilateral ovaries: Secondary | ICD-10-CM | POA: Diagnosis not present

## 2023-06-26 DIAGNOSIS — Z5111 Encounter for antineoplastic chemotherapy: Secondary | ICD-10-CM | POA: Diagnosis not present

## 2023-06-26 LAB — CMP (CANCER CENTER ONLY)
ALT: 15 U/L (ref 0–44)
AST: 18 U/L (ref 15–41)
Albumin: 3.7 g/dL (ref 3.5–5.0)
Alkaline Phosphatase: 140 U/L — ABNORMAL HIGH (ref 38–126)
Anion gap: 6 (ref 5–15)
BUN: 16 mg/dL (ref 8–23)
CO2: 26 mmol/L (ref 22–32)
Calcium: 9 mg/dL (ref 8.9–10.3)
Chloride: 113 mmol/L — ABNORMAL HIGH (ref 98–111)
Creatinine: 0.85 mg/dL (ref 0.44–1.00)
GFR, Estimated: >60 mL/min (ref >60)
Glucose, Bld: 111 mg/dL — ABNORMAL HIGH (ref 70–99)
Potassium: 3.8 mmol/L (ref 3.5–5.1)
Sodium: 145 mmol/L (ref 135–145)
Total Bilirubin: 0.7 mg/dL (ref 0.0–1.2)
Total Protein: 6.2 g/dL — ABNORMAL LOW (ref 6.5–8.1)

## 2023-06-26 LAB — CBC WITH DIFFERENTIAL (CANCER CENTER ONLY)
Abs Immature Granulocytes: 0.82 10*3/uL — ABNORMAL HIGH (ref 0.00–0.07)
Basophils Absolute: 0.1 10*3/uL (ref 0.0–0.1)
Basophils Relative: 0 %
Eosinophils Absolute: 0.2 10*3/uL (ref 0.0–0.5)
Eosinophils Relative: 1 %
HCT: 32.3 % — ABNORMAL LOW (ref 36.0–46.0)
Hemoglobin: 10.2 g/dL — ABNORMAL LOW (ref 12.0–15.0)
Immature Granulocytes: 7 %
Lymphocytes Relative: 30 %
Lymphs Abs: 3.5 10*3/uL (ref 0.7–4.0)
MCH: 32.2 pg (ref 26.0–34.0)
MCHC: 31.6 g/dL (ref 30.0–36.0)
MCV: 101.9 fL — ABNORMAL HIGH (ref 80.0–100.0)
Monocytes Absolute: 0.9 10*3/uL (ref 0.1–1.0)
Monocytes Relative: 7 %
Neutro Abs: 6.5 10*3/uL (ref 1.7–7.7)
Neutrophils Relative %: 55 %
Platelet Count: 208 10*3/uL (ref 150–400)
RBC: 3.17 MIL/uL — ABNORMAL LOW (ref 3.87–5.11)
RDW: 17.2 % — ABNORMAL HIGH (ref 11.5–15.5)
WBC Count: 12 10*3/uL — ABNORMAL HIGH (ref 4.0–10.5)
nRBC: 0 % (ref 0.0–0.2)

## 2023-06-26 LAB — CEA (ACCESS): CEA (CHCC): 7.25 ng/mL — ABNORMAL HIGH (ref 0.00–5.00)

## 2023-06-26 MED ORDER — DEXAMETHASONE SODIUM PHOSPHATE 10 MG/ML IJ SOLN
10.0000 mg | Freq: Once | INTRAMUSCULAR | Status: AC
  Administered 2023-06-26: 10 mg via INTRAVENOUS
  Filled 2023-06-26: qty 1

## 2023-06-26 MED ORDER — KETOROLAC TROMETHAMINE 30 MG/ML IJ SOLN
30.0000 mg | Freq: Once | INTRAMUSCULAR | Status: AC
  Administered 2023-06-26: 30 mg via INTRAVENOUS
  Filled 2023-06-26: qty 1

## 2023-06-26 MED ORDER — PALONOSETRON HCL INJECTION 0.25 MG/5ML
0.2500 mg | Freq: Once | INTRAVENOUS | Status: AC
  Administered 2023-06-26: 0.25 mg via INTRAVENOUS
  Filled 2023-06-26: qty 5

## 2023-06-26 MED ORDER — SODIUM CHLORIDE 0.9% FLUSH
10.0000 mL | INTRAVENOUS | Status: DC | PRN
  Administered 2023-06-26: 10 mL

## 2023-06-26 MED ORDER — TRAMADOL HCL 50 MG PO TABS
50.0000 mg | ORAL_TABLET | Freq: Four times a day (QID) | ORAL | 0 refills | Status: AC | PRN
Start: 2023-06-26 — End: ?

## 2023-06-26 MED ORDER — SODIUM CHLORIDE 0.9 % IV SOLN
5.0000 mg/kg | Freq: Once | INTRAVENOUS | Status: AC
  Administered 2023-06-26: 500 mg via INTRAVENOUS
  Filled 2023-06-26: qty 16

## 2023-06-26 MED ORDER — ATROPINE SULFATE 1 MG/ML IV SOLN
0.5000 mg | Freq: Once | INTRAVENOUS | Status: AC | PRN
  Administered 2023-06-26: 0.5 mg via INTRAVENOUS
  Filled 2023-06-26: qty 1

## 2023-06-26 MED ORDER — SODIUM CHLORIDE 0.9 % IV SOLN
2400.0000 mg/m2 | INTRAVENOUS | Status: DC
  Administered 2023-06-26: 5000 mg via INTRAVENOUS
  Filled 2023-06-26: qty 100

## 2023-06-26 MED ORDER — SODIUM CHLORIDE 0.9 % IV SOLN
100.0000 mg/m2 | Freq: Once | INTRAVENOUS | Status: AC
  Administered 2023-06-26: 220 mg via INTRAVENOUS
  Filled 2023-06-26: qty 11

## 2023-06-26 MED ORDER — SODIUM CHLORIDE 0.9 % IV SOLN
400.0000 mg/m2 | Freq: Once | INTRAVENOUS | Status: AC
  Administered 2023-06-26: 896 mg via INTRAVENOUS
  Filled 2023-06-26: qty 44.8

## 2023-06-26 NOTE — Progress Notes (Signed)
 Nutrition Follow-up:  Met with patient during infusion.  Reports that her appetite is good.  Typically goes down few days after treatment then pick back up.  Usually eating 3 meals a day.  Does not drink oral nutrition supplements.  Including protein source at every meal.   Medications: reviewed  Labs: reviewed  Anthropometrics:   Weight 216 lb  215 lb on 11/11 216 lb on 8/5 211 lb 12.8 oz on 6/10   NUTRITION DIAGNOSIS: Inadequate oral intake stable    INTERVENTION:  Continue well balanced diet including lean protein at every meal.    MONITORING, EVALUATION, GOAL: weight trends, intake   NEXT VISIT: as needed  Clete Kuch B. Freida Busman, RD, LDN Registered Dietitian [phone]

## 2023-06-26 NOTE — Progress Notes (Signed)
 Patient Care Team: [name], MD as PCP - General (Family Medicine) [name], MD as Consulting Physician (General Surgery) [name], MD as Consulting Physician (Hematology) [name], MD as Consulting Physician (General Surgery) [name], MD as Consulting Physician (Hematology and Oncology) [facility], [facility]   CHIEF COMPLAINT: Follow-up colon cancer  Oncology History Overview Note   Cancer Staging  Malignant neoplasm of female breast [facility]) Staging form: Breast, AJCC 7th Edition - Clinical stage from 06/11/2014: Stage 0 (Tis (DCIS), N0, M0) - Unsigned Staged by: Pathologist and managing physician Laterality: Left Estrogen receptor status: Positive Progesterone receptor status: Positive Stage used in treatment planning: Yes National guidelines used in treatment planning: Yes Type of national guideline used in treatment planning: NCCN - Pathologic stage from 07/03/2014: Stage Unknown (Tis (DCIS), NX, cM0) - Signed by [name], MD on 07/10/2014 Staged by: Pathologist Laterality: Left Estrogen receptor status: Positive Progesterone receptor status: Positive Stage used in treatment planning: Yes National guidelines used in treatment planning: Yes Type of national guideline used in treatment planning: NCCN Staging comments: Staged on final lumpectomy specimen by Dr. [name].  Right colon cancer Staging form: Colon and Rectum, AJCC 8th Edition - Pathologic stage from 11/01/2019: Stage IIIB (pT3, pN2a, cM0) - Signed by [name], MD on 11/29/2019 Stage prefix: Initial diagnosis Histologic grading system: 4 grade system Histologic grade (G): G2 Residual tumor (R): R0 - None Tumor deposits (TD): Absent Perineural invasion (PNI): Absent Microsatellite instability (MSI): Stable KRAS mutation: Unknown NRAS mutation: Unknown BRAF mutation: Unknown     Malignant neoplasm of female breast (HCC)  05/29/2014 Initial Biopsy   Left breast  needle core biopsy: Grade 2, DCIS with calcs. ER+ (100%), PR+ (96%).    06/04/2014 Initial Diagnosis   Left breast DCIS with calcifications, ER 100%, PR 96%   06/10/2014 Breast MRI   Left breast: 2.4 x 1.3 x 1.1 cm area of patchy non-mass enhancement upper outer quadrant includes postbiopsy seroma; Right breast: 1.2 cm previously biopsied stable benign fibroadenoma   06/12/2014 Procedure   Genetic counseling/testing: Identified 1 VUS on CHEK2 gene. Remainder of 17 gene panel tested negative and included: ATM, BARD1, BRCA 1/2, BRIP1, CDH1, CHEK2, EPCAM, MLH1, MSH2, MSH6, NBN, NF1, PALB2, PTEN, RAD50, RAD51C, RAD51D, STK11, and TP53.    07/01/2014 Surgery   Left breast lumpectomy ([facility]): Grade 1, DCIS, spanning 2.3 cm, 1 mm margin, ER 100%, PR 96%   07/31/2014 - 08/28/2014 Radiation Therapy   Adjuvant RT completed [name]). Left breast: Total dose 42.5 Gy over 17 fractions. Left breast boost: Total dose 7.5 Gy over 3 fractions.    09/14/2014 - 01/13/2020 Anti-estrogen oral therapy   Anastrazole 1mg  daily. Planned duration of treatment: 5 years [place]). Completed in 01/2020.    09/25/2014 Survivorship   Survivorship Care Plan given to patient and reviewed with her in person.    03/02/2021 Imaging   CT CAP  IMPRESSION: 1. No findings of active/recurrent malignancy. Partial right hemicolectomy. 2. Endometrial stripe remains mildly thickened, but endometrial biopsy in May was negative for malignancy. 3. Progressive endplate sclerosis and endplate irregularity at T2-3, probably due to degenerative endplate findings. If the has referable upper thoracic pain/symptoms then thoracic spine MRI could be used for further workup. 4. Other imaging findings of potential clinical significance: Mild cardiomegaly. Aortic Atherosclerosis (ICD10-I70.0). Mild mitral valve calcification. Postoperative findings in the left breast with adjacent radiation port anteriorly in the left upper lobe. Tiny pulmonary  nodules in the  left lower lobe are unchanged from earliest available comparison of 10/17/2019 and probably benign although may merit surveillance. Multilevel lumbar impingement. Mild pelvic floor laxity.   Malignant neoplasm of ascending colon (HCC)  09/19/2019 Imaging   CT AP W contrast 09/19/19  IMPRESSION Fullness in the cecum, cannot exclude a mass. No evidence for metastatic disease is identified.    09/24/2019 Procedure   Colonoscopy by Dr [name] 09/24/19 IMPRESSION 1. The colon was redundant  2. Mild diverticulosis was noted through the entire examined colon 3. Single 12mm polyp was found in the ascending colon; polypectomy was performed using snare cautery and biopsy forceps 4. Mild diverticulosis was notes in the descending colon and sigmoid colon.  5. Single polyp was found in the sigmoid colon, polypectomy was performed with cold forceps.  6. Single polyp was found in the rectosigmoid colon; polypectomy was performed with cold snare  7. Small internal hemorrhoids  8. Large mass was found at the cecum; multiple biopsies of the area were performed using cold forceps; injection (tattooing) was performed distal to the mass.    09/24/2019 Initial Biopsy   INTERPRETATION AND DIAGNOSIS:  A. Cecum, biopsy:  Invasive moderately differentiated adenocarcinoma.  see comment  B. Polyp @ ascending colon, polypectomy:  Tubular Adenoma  C. Polyp @ sigmoid colon Polypectomy:  hyperplastic polyp.  D. Polyp @ rectosigmoid colon, Polypectomy:  Hyperplastic Polyp      10/16/2019 Imaging   CT Chest IMPRESSION: 1. Multiple small pulmonary nodules measuring 5 mm or less in size in the lungs. These are nonspecific and are typically considered statistically likely benign. However, given the patient's history of primary malignancy, close attention on follow-up studies is recommended to ensure stability. 2. Aortic atherosclerosis, in addition to right coronary artery disease. Assessment for  potential risk factor modification, dietary therapy or pharmacologic therapy may be warranted, if clinically indicated. 3. There are calcifications of the aortic valve and mitral annulus. Echocardiographic correlation for evaluation of potential valvular dysfunction may be warranted if clinically indicated. 4. Small hiatal hernia.   Aortic Atherosclerosis (ICD10-I70.0).   11/01/2019 Initial Diagnosis   Colon cancer (HCC)   11/01/2019 Surgery   LAPAROSCOPIC PARTIAL COLECTOMY by Dr Maisie Fus and Dr Michaell Cowing   11/01/2019 Pathology Results   FINAL MICROSCOPIC DIAGNOSIS:   A. COLON, PROXIMAL RIGHT, COLECTOMY:  - Invasive colonic adenocarcinoma, 5 cm.  - Tumor invades through the muscularis propria into pericolonic tissues.   - Margins of resection are not involved.  - Metastatic carcinoma in (5) of (13) lymph nodes.  - See oncology table.    MSI Stable  Mismatch repair normal  MLH1 - Preserved nuclear expression (greater 50% tumor expression) MSH2 - Preserved nuclear expression (greater 50% tumor expression) MSH6 - Preserved nuclear expression (greater 50% tumor expression) PMS2 - Preserved nuclear expression (greater 50% tumor expression)   11/01/2019 Cancer Staging   Staging form: Colon and Rectum, AJCC 8th Edition - Pathologic stage from 11/01/2019: Stage IIIB (pT3, pN2a, cM0) - Signed by [name], MD on 11/29/2019   12/10/2019 Procedure   PAC placed 12/10/19   12/17/2019 - 06/01/2020 Chemotherapy   FOLFOX q2weeks starting in 2 weeks starting 12/17/19. Held 01/27/20-02/10/20 due to b/l PE. Oxaliplatin held C11-12 due to neuropathy. Completed on 06/01/20   03/02/2021 Imaging   CT CAP  IMPRESSION: 1. No findings of active/recurrent malignancy. Partial right hemicolectomy. 2. Endometrial stripe remains mildly thickened, but endometrial biopsy in May was negative for malignancy. 3. Progressive endplate sclerosis and endplate irregularity at T2-3,  probably due to degenerative endplate  findings. If the has referable upper thoracic pain/symptoms then thoracic spine MRI could be used for further workup. 4. Other imaging findings of potential clinical significance: Mild cardiomegaly. Aortic Atherosclerosis (ICD10-I70.0). Mild mitral valve calcification. Postoperative findings in the left breast with adjacent radiation port anteriorly in the left upper lobe. Tiny pulmonary nodules in the left lower lobe are unchanged from earliest available comparison of 10/17/2019 and probably benign although may merit surveillance. Multilevel lumbar impingement. Mild pelvic floor laxity.   08/26/2021 Imaging   EXAM: CT CHEST, ABDOMEN, AND PELVIS WITH CONTRAST  IMPRESSION: 1. Stable examination without new or progressive findings to suggest local recurrence or metastatic disease within the chest, abdomen, or pelvis. 2. Hepatomegaly with hepatic steatosis. 3. Sigmoid colonic diverticulosis without findings of acute diverticulitis. 4. Similar prominent endplate sclerosis and irregularity at T2-T3 is most consistent with Modic type endplate degenerative changes. However, if patient has referable upper thoracic pain consider further workup with thoracic spine MRI. 5. Similar thickening of the endometrial stripe measuring up to 8 mm, which was previously biopsied with results negative for malignancy. 6. Aortic Atherosclerosis (ICD10-I70.0).   11/10/2021 PET scan   IMPRESSION: 1. LEFT ovary is increased in size and is intensely hypermetabolic. While physiologic hypermetabolic ovarian tissue is not uncommon, the enlargement and asymmetric activity warrants further evaluation. Consider contrast pelvic MRI vs tissue sampling. 2. No evidence of metastatic colorectal carcinoma otherwise. 3. Post RIGHT hemicolectomy anatomy. 4. Evidence of radiation change in the LEFT upper lobe (remote breast cancer).     12/22/2021 Relapse/Recurrence    FINAL MICROSCOPIC DIAGNOSIS:   A. LEFT OVARY AND  FALLOPIAN TUBE, SALPINGO OOPHORECTOMY:  - Metastatic moderately differentiated colonic adenocarcinoma involving left ovary  - Focal ovarian stromal calcification  - Segment of benign fallopian tube   B. UTERUS WITH RIGHT FALLOPIAN TUBE AND OVARY, HYSTERECTOMY AND SALPINGO-OOPHORECTOMY:  - Metastatic moderately differentiated colonic adenocarcinoma involving right ovary  - Focal invasive extensive adenomyosis  - Benign endometrial polyps  - Benign proliferative phase endometrium  - Hydrosalpinx of right fallopian tube  - Focal ovarian stromal calcification   C. PERITONEAL DEPOSITS, ANTERIOR CUL DE SAC, BIOPSY:  - Metastatic mucinous adenocarcinoma, consistent with colorectal primary    COMMENT:  Immunohistochemical stains show that the tumor cells are positive for CK20 and CDX2 while they are negative for CK7 and PAX8, consistent with above interpretation.    01/24/2022 - 03/23/2022 Chemotherapy   Patient is on Treatment Plan : COLORECTAL FOLFOX q14d x 3 months     04/01/2022 Imaging   CT AP IMPRESSION: 1. New omental metastatic disease. 2. Vague hypoattenuating lesion in the inferior right hepatic lobe is new and also worrisome for metastatic disease. 3. Similar small right lower lobe nodules. Recommend continued attention on follow-up. 4. Steatotic enlarged liver. 5.  Aortic atherosclerosis (ICD10-I70.0).   04/06/2022 -  Chemotherapy   Patient is on Treatment Plan : COLORECTAL FOLFIRI + Bevacizumab q14d     04/21/2022 Imaging    IMPRESSION: 1. Stable chest CT. No evidence of metastatic disease. 2. Stable small pulmonary nodules, considered benign based on stability. 3. Stable postsurgical changes in the left breast and anterior left upper lobe radiation changes. 4. Aortic Atherosclerosis (ICD10-I70.0) and Emphysema (ICD10-J43.9).   04/21/2022 Imaging    IMPRESSION: 1. Small enhancing lesion inferiorly in the right hepatic lobe is typical of metastatic disease and stable  from recent abdominal CT. No other definite liver lesions are identified. Mild hepatic steatosis. 2. No  other significant abdominal findings. 3. Omental nodularity seen on CT is not well visualized by MRI.    Imaging     06/23/2022 Imaging    IMPRESSION: 1. Hypodense lesion of the inferior right lobe of the liver, hepatic segment VI is diminished in size, consistent with treatment response. 2. Tiny peritoneal and omental nodules identified by prior examination are diminished in size, consistent with treatment response. 3. Multiple tiny pulmonary nodules unchanged, most likely benign and incidental, however continued attention on follow-up warranted in the setting of known metastatic disease. 4. No evidence of new metastatic disease in the chest, abdomen, or pelvis. 5. Status post partial right hemicolectomy and reanastomosis. 6. Diffuse mosaic attenuation of the airspaces, consistent with small airways disease. 7. Hepatomegaly.      CURRENT THERAPY: FOLFIRI/Beva q2 weeks  INTERVAL HISTORY Kim Castro returns for follow-up and treatment as scheduled, last seen 06/11/2022 with other cycle of FOLFIRI/Beva.  She underwent restaging CT. She continues to tolerate treatment well with intermittent diarrhea which is manageable. She has worsening back pain. Heat and repositioning helps but only for a few minutes. Ibuprofen is not helpful. Pain is 8/10 due to prolonged sitting in the waiting room. Pain is stabbing and aching. Does not feel like neuropathy. Denies hematuria, loss of bladder/bowel control or any other new concerns.   ROS  All other systems reviewed and negative  Past Medical History:  Diagnosis Date   Aortic atherosclerosis (HCC)    Arthritis    feet, lower back   Basal cell carcinoma    arm   Breast cancer of upper-outer quadrant of left female breast (HCC) 06/04/2014   Cataract    immature on the left   Colon cancer (HCC) 08/2019   Diabetes mellitus without  complication (HCC)    Diverticulosis    Dizziness    > 66yrs ago;took Antivert    Family history of anesthesia complication    sister slow to wake up with anesthesia   Family history of breast cancer    Family history of colon cancer    Family history of uterine cancer    GERD (gastroesophageal reflux disease)    takes occasional TUMs   History of bronchitis    > 23yrs ago   History of colon polyps    History of hiatal hernia    Small noted on CT   History of pulmonary embolus (PE)    Hypertension    takes Losartan daily and HCTZ   Iron deficiency anemia    Joint pain    Metastatic disease (HCC) 2023   peritoneum and liver mets   Numbness    to toes on each foot   Peripheral neuropathy    feet and toes   Personal history of radiation therapy    Pulmonary nodules    Noted on CT   Radiation 07/31/14-08/28/14   Left Breast 20 fxs   Seasonal allergies    takes Claritin prn   Urinary frequency    Vitamin D deficiency    takes VIt D daily     Past Surgical History:  Procedure Laterality Date   BREAST BIOPSY Bilateral    BREAST LUMPECTOMY Left    BREAST LUMPECTOMY WITH RADIOACTIVE SEED LOCALIZATION Left 07/01/2014   Procedure: LEFT BREAST LUMPECTOMY WITH RADIOACTIVE SEED LOCALIZATION;  Surgeon: Glenna Fellows, MD;  Location: [facility];  Service: General;  Laterality: Left;   CATARACT EXTRACTION Right    COLONOSCOPY  09/24/2019   [place]   LAPAROSCOPIC  PARTIAL COLECTOMY N/A 11/01/2019   Procedure: LAPAROSCOPIC PARTIAL COLECTOMY;  Surgeon: [name], MD;  Location: [facility];  Service: General;  Laterality: N/A;   POLYPECTOMY     PORTACATH PLACEMENT N/A 12/10/2019   Procedure: INSERTION PORT-A-CATH ULTRASOUND GUIDED IN RIGHT IJ;  Surgeon: [name], MD;  Location: [facility];  Service: General;  Laterality: N/A;   ROBOTIC ASSISTED TOTAL HYSTERECTOMY Bilateral 12/22/2021   Procedure: XI ROBOTIC ASSISTED TOTAL HYSTERECTOMY WITH BILATERAL SALPINGO  OOPHORECTOMY;  Surgeon: Carver Fila, MD;  Location: [facility];  Service: Gynecology;  Laterality: Bilateral;   TOTAL KNEE ARTHROPLASTY Left 10/24/2012   Procedure: TOTAL KNEE ARTHROPLASTY;  Surgeon: Nestor Lewandowsky, MD;  Location: [facility];  Service: Orthopedics;  Laterality: Left;   TOTAL KNEE ARTHROPLASTY Right 01/09/2013   Procedure: TOTAL KNEE ARTHROPLASTY;  Surgeon: Nestor Lewandowsky, MD;  Location: [facility];  Service: Orthopedics;  Laterality: Right;   TUBAL LIGATION       Outpatient Encounter Medications as of 06/26/2023  Medication Sig   traMADol (ULTRAM) 50 MG tablet Take 1 tablet (50 mg total) by mouth every 6 (six) hours as needed.   apixaban (ELIQUIS) 5 MG TABS tablet Take 1 tablet (5 mg total) by mouth 2 (two) times daily.   b complex vitamins capsule Take 1 capsule by mouth daily.   Cholecalciferol (VITAMIN D) 2000 UNITS CAPS Take 2,000 Units by mouth daily.    ibuprofen (ADVIL) 200 MG tablet Take 200 mg by mouth daily as needed (pain).   lidocaine-prilocaine (EMLA) cream Apply 1 Application topically as needed.   loratadine (CLARITIN) 10 MG tablet Take 10 mg by mouth daily as needed for allergies.   losartan (COZAAR) 25 MG tablet Take 1 tablet (25 mg total) by mouth daily.   metFORMIN (GLUCOPHAGE) 500 MG tablet Take 1 tablet (500 mg total) by mouth in the morning and at bedtime.   Multiple Vitamins-Minerals (MULTIVITAMIN WITH MINERALS) tablet Take 1 tablet by mouth daily.   omeprazole (PRILOSEC) 20 MG capsule Take 20 mg by mouth daily before breakfast.    ondansetron (ZOFRAN) 8 MG tablet Take 1 tablet (8 mg total) by mouth every 8 (eight) hours as needed for nausea or vomiting. Start on the third day after chemotherapy.   potassium chloride (KLOR-CON) 10 MEQ tablet Take 1 tablet by mouth twice daily   pravastatin (PRAVACHOL) 10 MG tablet Take 1 tablet (10 mg total) by mouth daily.   pregabalin (LYRICA) 25 MG capsule TAKE 1 CAPSULE BY MOUTH IN THE MORNING IN  ADDITION  TO THE 50 MG  CAPSULE AT  BEDTIME   pregabalin (LYRICA) 50 MG capsule Take 1 capsule (50 mg total) by mouth at bedtime. Take in addition to 25 mg in the morning   prochlorperazine (COMPAZINE) 10 MG tablet Take 1 tablet (10 mg total) by mouth every 6 (six) hours as needed for nausea or vomiting.   triamcinolone ointment (KENALOG) 0.5 % Apply 1 Application topically 2 (two) times daily.   Facility-Administered Encounter Medications as of 06/26/2023  Medication   [DISCONTINUED] sodium chloride flush (NS) 0.9 % injection 10 mL     Today's Vitals   06/26/23 0949  BP: (!) 151/70  Pulse: 63  Resp: 18  Temp: 98.2 F (36.8 C)  TempSrc: Tympanic  SpO2: 100%  Weight: 216 lb 3.2 oz (98.1 kg)  Height: 5' 5.5" (1.664 m)   Body mass index is 35.43 kg/m.   PHYSICAL EXAM GENERAL:alert, no distress and comfortable SKIN: no rash  EYES: sclera clear NECK: without mass LYMPH:  no palpable cervical or supraclavicular lymphadenopathy  LUNGS: clear with normal breathing effort HEART: regular rate & rhythm, no lower extremity edema ABDOMEN: abdomen soft, non-tender and normal bowel sounds NEURO: alert & oriented x 3 with fluent speech, no focal motor/sensory deficits PAC without erythema    CBC    Component Value Date/Time   WBC 12.0 (H) 06/26/2023 0816   WBC 12.7 (H) 10/03/2022 0611   RBC 3.17 (L) 06/26/2023 0816   HGB 10.2 (L) 06/26/2023 0816   HGB 13.0 10/03/2014 0914   HCT 32.3 (L) 06/26/2023 0816   HCT 38.1 10/03/2014 0914   PLT 208 06/26/2023 0816   PLT 310 10/03/2014 0914   MCV 101.9 (H) 06/26/2023 0816   MCV 93.9 10/03/2014 0914   MCH 32.2 06/26/2023 0816   MCHC 31.6 06/26/2023 0816   RDW 17.2 (H) 06/26/2023 0816   RDW 13.8 10/03/2014 0914   LYMPHSABS 3.5 06/26/2023 0816   LYMPHSABS 2.0 10/03/2014 0914   MONOABS 0.9 06/26/2023 0816   MONOABS 0.6 10/03/2014 0914   EOSABS 0.2 06/26/2023 0816   EOSABS 0.2 10/03/2014 0914   BASOSABS 0.1 06/26/2023 0816   BASOSABS 0.1 10/03/2014 0914      CMP     Component Value Date/Time   NA 145 06/26/2023 0816   NA 144 10/03/2014 0914   K 3.8 06/26/2023 0816   K 4.6 10/03/2014 0914   CL 113 (H) 06/26/2023 0816   CO2 26 06/26/2023 0816   CO2 29 10/03/2014 0914   GLUCOSE 111 (H) 06/26/2023 0816   GLUCOSE 133 10/03/2014 0914   BUN 16 06/26/2023 0816   BUN 17.8 10/03/2014 0914   CREATININE 0.85 06/26/2023 0816   CREATININE 0.8 10/03/2014 0914   CALCIUM 9.0 06/26/2023 0816   CALCIUM 9.7 10/03/2014 0914   PROT 6.2 (L) 06/26/2023 0816   PROT 7.0 10/03/2014 0914   ALBUMIN 3.7 06/26/2023 0816   ALBUMIN 3.7 10/03/2014 0914   AST 18 06/26/2023 0816   AST 18 10/03/2014 0914   ALT 15 06/26/2023 0816   ALT 13 10/03/2014 0914   ALKPHOS 140 (H) 06/26/2023 0816   ALKPHOS 74 10/03/2014 0914   BILITOT 0.7 06/26/2023 0816   BILITOT 0.98 10/03/2014 0914   GFRNONAA >60 06/26/2023 0816   GFRAA >60 01/30/2020 0429   GFRAA >60 01/27/2020 0834     ASSESSMENT & PLAN:Kim Castro is a 78 y.o. female with    1. Right colon cancer, pT3N2aM0 initially stage IIB, MSS; metastatic to b/l ovaries and peritoneum and liver now stage IV, KRAS mutation G12V (+)   -Diagnosed in 08/2019 , staging CT chest showed multiple small lung nodules that were nonspecific but likely benign.  -S/p surgery with Dr Maisie Fus on 11/01/19. Path showed overall Stage IIIB cancer.  -S/p 6 months of adjuvant FOLFOX completed 05/2020.  -Due to rising CEA she underwent a PET scan 11/10/2021 which showed hypermetabolism and enlargement to the left ovary, no other evidence of metastatic disease  -S/p total hysterectomy 12/22/2021 by Dr. Pricilla Holm, path revealed metastatic moderately differentiated colonic adenocarcinoma involving both ovaries with peritoneal deposits also positive for metastatic mucinous adenocarcinoma consistent with colorectal primary -[facility] showed K-ras mutation, unfortunately she is not eligible for any targeted therapy -She restarted FOLFOX 01/24/2022 -  04/2022, stopped due to disease progression. Started FOLFIRI/Beva 04/2022 -Restaging CT 10/2022 showed partial response in liver and peritoneal metastasis, no new lesions, and incidental bilateral PE; Beva was stopped (x3-6 months  due to PE), she continues FOLFIRI q2 weeks. Beva added back 01/16/23 -Kim Castro appears stable, tolerating FOLFIRI/Beva with no significant toxicities.  Diarrhea is manageable.  She is able to recover and function well with good performance status. -She has worsening lumbar pain, the etiology remains unknown.  I reviewed her restaging CT which shows stable small lung nodules, otherwise no evidence of cancer or explanation for her back pain.  Will proceed with lumbar MRI -Labs reviewed, adequate to proceed with FOLFIRI/Beva today as scheduled -Follow-up and next cycle in 2 weeks   2. Neuropathy, secondary to Oxaliplatin G2 -S/p C7 she started having numbness in hands, L>R which become prolonged with tingling s/p C10.  -Oxaliplatin dose reduced and ultimately held with C11-12.  -Controlled on Lyrica 25 mg BID and B vit complex -Neuropathy has slightly increased on FOLFIRI, improved with lyrica   3. H/o left breast DCIS, G2, ER/PR+ -Dx in 05/2014. S/p left lumpectomy with Dr Johna Sheriff, adjuvant RT with Dr [name], and adjuvant Anastrozole 08/2014-01/13/20 under the care of Dr Pamelia Hoit.  -DEXA 09/11/20 T score +1.2 normal.  -Mammogram 10/04/2021 was negative -No hypermetabolism in the breast on PET to suggest recurrence    4. Submassive bilateral pulmonary embolism, recurrent PE 10/2022 -S/p C3 chemo, her 01/27/20 CTA showed PE with right heart strain. Doppler negative for DVT.  -She was treated with 6 months of Eliquis -Recurrent PE on restaging CT 10/2022, incidental; she was asymptomatic -Will continue Eliquis indefinitely, tolerating well   5. Comorbidities: Arthritis, DM, HTN, GERD, neck and back pain with sciatica -she switched to Dr. Veto Kemps for primary care in  05/2021. -Chronic back pain is stable        PLAN: -CT and today's labs reviewed, stable -Proceed with FOLFIRI/Beva today as scheduled, toradol x1 in clinic -Rx: tramadol PRN -Lumbar MRI for back pain, will call with results -F/up and next cycle in 2 weeks  Orders Placed This Encounter  Procedures   MR Lumbar Spine W Wo Contrast    Standing Status:   Future    Expiration Date:   06/25/2024    If indicated for the ordered procedure, I authorize the administration of contrast media per Radiology protocol:   Yes    What is the patient's sedation requirement?:   No Sedation    Does the patient have a pacemaker or implanted devices?:   No    Use SRS Protocol?:   No    Preferred imaging location?:   [facility] (table limit - 550 lbs)      All questions were answered. The patient knows to call the clinic with any problems, questions or concerns. No barriers to learning were detected. I spent 20 minutes counseling the patient face to face. The total time spent in the appointment was 30 minutes and more than 50% was on counseling, review of test results, and coordination of care.   Santiago Glad, NP-C 06/26/2023

## 2023-06-28 ENCOUNTER — Inpatient Hospital Stay: Payer: Medicare Other

## 2023-06-28 ENCOUNTER — Telehealth: Payer: Self-pay | Admitting: Hematology

## 2023-06-28 VITALS — BP 148/62 | HR 61 | Temp 97.9°F | Resp 18

## 2023-06-28 DIAGNOSIS — Z5111 Encounter for antineoplastic chemotherapy: Secondary | ICD-10-CM | POA: Diagnosis not present

## 2023-06-28 DIAGNOSIS — Z5189 Encounter for other specified aftercare: Secondary | ICD-10-CM | POA: Diagnosis not present

## 2023-06-28 DIAGNOSIS — C182 Malignant neoplasm of ascending colon: Secondary | ICD-10-CM | POA: Diagnosis not present

## 2023-06-28 DIAGNOSIS — C787 Secondary malignant neoplasm of liver and intrahepatic bile duct: Secondary | ICD-10-CM | POA: Diagnosis not present

## 2023-06-28 DIAGNOSIS — R918 Other nonspecific abnormal finding of lung field: Secondary | ICD-10-CM | POA: Diagnosis not present

## 2023-06-28 DIAGNOSIS — Z95828 Presence of other vascular implants and grafts: Secondary | ICD-10-CM

## 2023-06-28 DIAGNOSIS — C7963 Secondary malignant neoplasm of bilateral ovaries: Secondary | ICD-10-CM | POA: Diagnosis not present

## 2023-06-28 DIAGNOSIS — R040 Epistaxis: Secondary | ICD-10-CM | POA: Diagnosis not present

## 2023-06-28 DIAGNOSIS — Z90710 Acquired absence of both cervix and uterus: Secondary | ICD-10-CM | POA: Diagnosis not present

## 2023-06-28 DIAGNOSIS — I10 Essential (primary) hypertension: Secondary | ICD-10-CM | POA: Diagnosis not present

## 2023-06-28 DIAGNOSIS — M545 Low back pain, unspecified: Secondary | ICD-10-CM | POA: Diagnosis not present

## 2023-06-28 DIAGNOSIS — Z90722 Acquired absence of ovaries, bilateral: Secondary | ICD-10-CM | POA: Diagnosis not present

## 2023-06-28 MED ORDER — HEPARIN SOD (PORK) LOCK FLUSH 100 UNIT/ML IV SOLN
500.0000 [IU] | Freq: Once | INTRAVENOUS | Status: AC | PRN
  Administered 2023-06-28: 500 [IU]

## 2023-06-28 MED ORDER — SODIUM CHLORIDE 0.9% FLUSH
10.0000 mL | INTRAVENOUS | Status: DC | PRN
  Administered 2023-06-28: 10 mL

## 2023-06-29 ENCOUNTER — Inpatient Hospital Stay: Payer: Medicare Other

## 2023-06-29 DIAGNOSIS — Z5111 Encounter for antineoplastic chemotherapy: Secondary | ICD-10-CM | POA: Diagnosis not present

## 2023-06-29 DIAGNOSIS — Z5189 Encounter for other specified aftercare: Secondary | ICD-10-CM | POA: Diagnosis not present

## 2023-06-29 DIAGNOSIS — C182 Malignant neoplasm of ascending colon: Secondary | ICD-10-CM

## 2023-06-29 DIAGNOSIS — C787 Secondary malignant neoplasm of liver and intrahepatic bile duct: Secondary | ICD-10-CM | POA: Diagnosis not present

## 2023-06-29 DIAGNOSIS — R918 Other nonspecific abnormal finding of lung field: Secondary | ICD-10-CM | POA: Diagnosis not present

## 2023-06-29 DIAGNOSIS — I10 Essential (primary) hypertension: Secondary | ICD-10-CM | POA: Diagnosis not present

## 2023-06-29 DIAGNOSIS — C7963 Secondary malignant neoplasm of bilateral ovaries: Secondary | ICD-10-CM | POA: Diagnosis not present

## 2023-06-29 DIAGNOSIS — R040 Epistaxis: Secondary | ICD-10-CM | POA: Diagnosis not present

## 2023-06-29 DIAGNOSIS — Z90722 Acquired absence of ovaries, bilateral: Secondary | ICD-10-CM | POA: Diagnosis not present

## 2023-06-29 DIAGNOSIS — M545 Low back pain, unspecified: Secondary | ICD-10-CM | POA: Diagnosis not present

## 2023-06-29 DIAGNOSIS — Z90710 Acquired absence of both cervix and uterus: Secondary | ICD-10-CM | POA: Diagnosis not present

## 2023-06-29 MED ORDER — PEGFILGRASTIM-CBQV 6 MG/0.6ML ~~LOC~~ SOSY
6.0000 mg | PREFILLED_SYRINGE | Freq: Once | SUBCUTANEOUS | Status: AC
  Administered 2023-06-29: 6 mg via SUBCUTANEOUS
  Filled 2023-06-29: qty 0.6

## 2023-07-04 ENCOUNTER — Ambulatory Visit (HOSPITAL_COMMUNITY)
Admission: RE | Admit: 2023-07-04 | Discharge: 2023-07-04 | Disposition: A | Discharge status: Home / Self Care | Payer: Medicare Other | Source: Ambulatory Visit | Attending: Nurse Practitioner | Admitting: Nurse Practitioner

## 2023-07-04 DIAGNOSIS — C182 Malignant neoplasm of ascending colon: Secondary | ICD-10-CM | POA: Insufficient documentation

## 2023-07-04 DIAGNOSIS — C189 Malignant neoplasm of colon, unspecified: Secondary | ICD-10-CM | POA: Diagnosis not present

## 2023-07-04 DIAGNOSIS — M5135 Other intervertebral disc degeneration, thoracolumbar region: Secondary | ICD-10-CM | POA: Diagnosis not present

## 2023-07-04 DIAGNOSIS — M5137 Other intervertebral disc degeneration, lumbosacral region with discogenic back pain only: Secondary | ICD-10-CM | POA: Diagnosis not present

## 2023-07-04 DIAGNOSIS — M5126 Other intervertebral disc displacement, lumbar region: Secondary | ICD-10-CM | POA: Diagnosis not present

## 2023-07-04 MED ORDER — GADOBUTROL 1 MMOL/ML IV SOLN
10.0000 mL | Freq: Once | INTRAVENOUS | Status: AC | PRN
  Administered 2023-07-04: 10 mL via INTRAVENOUS

## 2023-07-05 ENCOUNTER — Other Ambulatory Visit: Payer: Self-pay | Admitting: Nurse Practitioner

## 2023-07-08 NOTE — Assessment & Plan Note (Signed)
 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 Path showed overall Stage IIIB cancer.  -s/p 6 months adjuvant FOLFOX, completed 06/01/20, oxaliplatin held for final 2 cycles due to neuropathy.  -s/p BSO and hysterectomy on 12/22/21 with Dr. Pricilla Holm. Path revealed metastatic moderately differentiated colonic adenocarcinoma involving both ovaries and peritoneum -she restarted FOLFOX on 01/24/22. -Due to disease progression, chemotherapy changed to second line FOLFIRI and bevacizumab on April 06, 2022. She has been tolerating moderately well, better now with dose reduction, will continue -Restaging CT scan from 09/27/2022 showed continous response in liver and peritoneal metastasis, no other new lesions.   -We discussed maintenance therapy option in future  -due to her PE in 10/2022, will hold beva for 3 months  -restaging CT 12/31/2022 showed stable disease and live met is not well defined on CT. She restarted beva on 01/17/23  -restaging CT 06/19/2023 showed overall stable disease (stable small lung nodules, no other evidence of cancer)

## 2023-07-10 ENCOUNTER — Encounter: Payer: Self-pay | Admitting: Hematology

## 2023-07-10 ENCOUNTER — Inpatient Hospital Stay: Payer: Medicare Other

## 2023-07-10 ENCOUNTER — Other Ambulatory Visit: Payer: Self-pay

## 2023-07-10 ENCOUNTER — Inpatient Hospital Stay: Payer: Medicare Other | Attending: Hematology | Admitting: Hematology

## 2023-07-10 VITALS — BP 147/60

## 2023-07-10 VITALS — BP 160/80 | HR 71 | Temp 97.4°F | Resp 17 | Wt 210.4 lb

## 2023-07-10 DIAGNOSIS — C7963 Secondary malignant neoplasm of bilateral ovaries: Secondary | ICD-10-CM | POA: Diagnosis not present

## 2023-07-10 DIAGNOSIS — C182 Malignant neoplasm of ascending colon: Secondary | ICD-10-CM

## 2023-07-10 DIAGNOSIS — C787 Secondary malignant neoplasm of liver and intrahepatic bile duct: Secondary | ICD-10-CM | POA: Insufficient documentation

## 2023-07-10 DIAGNOSIS — Z5111 Encounter for antineoplastic chemotherapy: Secondary | ICD-10-CM | POA: Diagnosis not present

## 2023-07-10 DIAGNOSIS — Z5189 Encounter for other specified aftercare: Secondary | ICD-10-CM | POA: Insufficient documentation

## 2023-07-10 DIAGNOSIS — C786 Secondary malignant neoplasm of retroperitoneum and peritoneum: Secondary | ICD-10-CM | POA: Diagnosis not present

## 2023-07-10 DIAGNOSIS — Z95828 Presence of other vascular implants and grafts: Secondary | ICD-10-CM

## 2023-07-10 LAB — CBC WITH DIFFERENTIAL (CANCER CENTER ONLY)
Abs Immature Granulocytes: 0.73 10*3/uL — ABNORMAL HIGH (ref 0.00–0.07)
Basophils Absolute: 0.2 10*3/uL — ABNORMAL HIGH (ref 0.0–0.1)
Basophils Relative: 2 %
Eosinophils Absolute: 0.2 10*3/uL (ref 0.0–0.5)
Eosinophils Relative: 1 %
HCT: 35 % — ABNORMAL LOW (ref 36.0–46.0)
Hemoglobin: 11 g/dL — ABNORMAL LOW (ref 12.0–15.0)
Immature Granulocytes: 6 %
Lymphocytes Relative: 26 %
Lymphs Abs: 3.2 10*3/uL (ref 0.7–4.0)
MCH: 32.1 pg (ref 26.0–34.0)
MCHC: 31.4 g/dL (ref 30.0–36.0)
MCV: 102 fL — ABNORMAL HIGH (ref 80.0–100.0)
Monocytes Absolute: 0.9 10*3/uL (ref 0.1–1.0)
Monocytes Relative: 7 %
Neutro Abs: 7.1 10*3/uL (ref 1.7–7.7)
Neutrophils Relative %: 58 %
Platelet Count: 197 10*3/uL (ref 150–400)
RBC: 3.43 MIL/uL — ABNORMAL LOW (ref 3.87–5.11)
RDW: 17.3 % — ABNORMAL HIGH (ref 11.5–15.5)
WBC Count: 12.3 10*3/uL — ABNORMAL HIGH (ref 4.0–10.5)
nRBC: 0.2 % (ref 0.0–0.2)

## 2023-07-10 LAB — CMP (CANCER CENTER ONLY)
ALT: 21 U/L (ref 0–44)
AST: 32 U/L (ref 15–41)
Albumin: 4 g/dL (ref 3.5–5.0)
Alkaline Phosphatase: 171 U/L — ABNORMAL HIGH (ref 38–126)
Anion gap: 6 (ref 5–15)
BUN: 15 mg/dL (ref 8–23)
CO2: 27 mmol/L (ref 22–32)
Calcium: 9 mg/dL (ref 8.9–10.3)
Chloride: 110 mmol/L (ref 98–111)
Creatinine: 0.85 mg/dL (ref 0.44–1.00)
GFR, Estimated: >60 mL/min (ref >60)
Glucose, Bld: 115 mg/dL — ABNORMAL HIGH (ref 70–99)
Potassium: 3.9 mmol/L (ref 3.5–5.1)
Sodium: 143 mmol/L (ref 135–145)
Total Bilirubin: 0.6 mg/dL (ref 0.0–1.2)
Total Protein: 6.4 g/dL — ABNORMAL LOW (ref 6.5–8.1)

## 2023-07-10 LAB — CEA (ACCESS): CEA (CHCC): 8.81 ng/mL — ABNORMAL HIGH (ref 0.00–5.00)

## 2023-07-10 LAB — TOTAL PROTEIN, URINE DIPSTICK: Protein, ur: NEGATIVE mg/dL

## 2023-07-10 MED ORDER — SODIUM CHLORIDE 0.9% FLUSH: 10.0000 mL | INTRAVENOUS | Status: DC | PRN

## 2023-07-10 MED ORDER — SODIUM CHLORIDE 0.9% FLUSH
10.0000 mL | INTRAVENOUS | Status: DC | PRN
  Administered 2023-07-10: 10 mL

## 2023-07-10 MED ORDER — ATROPINE SULFATE 1 MG/ML IV SOLN
0.5000 mg | Freq: Once | INTRAVENOUS | Status: AC | PRN
  Administered 2023-07-10: 0.5 mg via INTRAVENOUS
  Filled 2023-07-10: qty 1

## 2023-07-10 MED ORDER — DEXAMETHASONE SODIUM PHOSPHATE 10 MG/ML IJ SOLN
10.0000 mg | Freq: Once | INTRAMUSCULAR | Status: AC
  Administered 2023-07-10: 10 mg via INTRAVENOUS
  Filled 2023-07-10: qty 1

## 2023-07-10 MED ORDER — SODIUM CHLORIDE 0.9 % IV SOLN: INTRAVENOUS | Status: DC

## 2023-07-10 MED ORDER — SODIUM CHLORIDE 0.9 % IV SOLN
100.0000 mg/m2 | Freq: Once | INTRAVENOUS | Status: AC
  Administered 2023-07-10: 220 mg via INTRAVENOUS
  Filled 2023-07-10: qty 11

## 2023-07-10 MED ORDER — SODIUM CHLORIDE 0.9 % IV SOLN
400.0000 mg/m2 | Freq: Once | INTRAVENOUS | Status: AC
  Administered 2023-07-10: 896 mg via INTRAVENOUS
  Filled 2023-07-10: qty 44.8

## 2023-07-10 MED ORDER — SODIUM CHLORIDE 0.9 % IV SOLN
2400.0000 mg/m2 | INTRAVENOUS | Status: DC
  Administered 2023-07-10: 5000 mg via INTRAVENOUS
  Filled 2023-07-10: qty 100

## 2023-07-10 MED ORDER — SODIUM CHLORIDE 0.9 % IV SOLN
5.0000 mg/kg | Freq: Once | INTRAVENOUS | Status: AC
  Administered 2023-07-10: 500 mg via INTRAVENOUS
  Filled 2023-07-10: qty 4

## 2023-07-10 MED ORDER — HEPARIN SOD (PORK) LOCK FLUSH 100 UNIT/ML IV SOLN
500.0000 [IU] | Freq: Once | INTRAVENOUS | Status: DC | PRN
Start: 2023-07-10 — End: 2023-07-10

## 2023-07-10 MED ORDER — PALONOSETRON HCL INJECTION 0.25 MG/5ML
0.2500 mg | Freq: Once | INTRAVENOUS | Status: AC
  Administered 2023-07-10: 0.25 mg via INTRAVENOUS
  Filled 2023-07-10: qty 5

## 2023-07-10 NOTE — Progress Notes (Signed)
 [facility]   Telephone:([phone]) [phone] Fax:([phone]   Clinic Follow up Note   Patient Care Team: [name], MD as PCP - General (Family Medicine) [name], MD as Consulting Physician (General Surgery) [name], MD as Consulting Physician (Hematology) [name], MD as Consulting Physician (General Surgery) [name], MD as Consulting Physician (Hematology and Oncology) [facility], [facility]  Date of Service:  07/10/2023  CHIEF COMPLAINT: f/u of colon cancer  CURRENT THERAPY:  FOLFIRI and bevacizumab  Oncology History   Malignant neoplasm of ascending colon (HCC) pT3N2aM0 stage IIB, MSS, metastatic to b/l ovaries and peritoneum and liver, KRAS mutation G12V (+)  -Initially diagnosed in 08/2019, s/p resection with Dr Maisie Fus on 11/01/19. Path showed overall Stage IIIB cancer.  -s/p 6 months adjuvant FOLFOX, completed 06/01/20, oxaliplatin held for final 2 cycles due to neuropathy.  -s/p BSO and hysterectomy on 12/22/21 with Dr. Pricilla Holm. Path revealed metastatic moderately differentiated colonic adenocarcinoma involving both ovaries and peritoneum -she restarted FOLFOX on 01/24/22. -Due to disease progression, chemotherapy changed to second line FOLFIRI and bevacizumab on April 06, 2022. She has been tolerating moderately well, better now with dose reduction, will continue -Restaging CT scan from 09/27/2022 showed continous response in liver and peritoneal metastasis, no other new lesions.   -We discussed maintenance therapy option in future  -due to her PE in 10/2022, will hold beva for 3 months  -restaging CT 12/31/2022 showed stable disease and live met is not well defined on CT. She restarted beva on 01/17/23  -restaging CT 06/19/2023 showed overall stable disease (stable small lung nodules, no other evidence of cancer)    Assessment and Plan    Metastatic colon cancer She has been on chemotherapy for metastatic colon  cancer for almost two years, with the current regimen starting in December 2023. Recent CT scans show no significant progression, with no small nodules in the peritoneum and previously noted liver spots no longer visible. She is tolerating the treatment well, but there is a possibility of cancer recurrence if chemotherapy is stopped. A PET scan is planned for May, pending insurance approval, to assess the cancer status. Additionally, a circulating tumor DNA test may be conducted to detect microscopic disease. If both tests are negative, there is a potential to discontinue chemotherapy, although there is a high possibility of cancer recurrence without it. - Schedule PET scan in May to assess cancer status in May  - Consider circulating tumor DNA test to detect microscopic disease -We discussed the possibility of discontinuing chemotherapy based on PET scan and circulating tumor DNA results  Back pain She reports intermittent, non-debilitating back pain localized to the lower right side of the spine, present for a couple of months and recently more intense. MRI results indicate arthritis changes, not related to cancer. Referral to an orthopedic specialist is recommended for further management. - Refer to Dr. Claria Dice at [facility] for back pain management - Send MRI report to Dr. Regino Schultze - Advise her to bring MRI disc if needed  Plan -I reviewed her lumbar MRI findings and discussed with her. -Will refer her to [facility] orthopedics Dr. Regino Schultze for her back pain -Follow-up in 2 weeks before next cycle chemo    SUMMARY OF ONCOLOGIC HISTORY: Oncology History Overview Note   Cancer Staging  Malignant neoplasm of female breast [facility]) Staging form: Breast, AJCC 7th Edition - Clinical stage from 06/11/2014: Stage 0 (Tis (DCIS), N0, M0) - Unsigned Staged by: Pathologist  and managing physician Laterality: Left Estrogen receptor status: Positive Progesterone receptor status: Positive Stage used in  treatment planning: Yes National guidelines used in treatment planning: Yes Type of national guideline used in treatment planning: NCCN - Pathologic stage from 07/03/2014: Stage Unknown (Tis (DCIS), NX, cM0) - Signed by [name], MD on 07/10/2014 Staged by: Pathologist Laterality: Left Estrogen receptor status: Positive Progesterone receptor status: Positive Stage used in treatment planning: Yes National guidelines used in treatment planning: Yes Type of national guideline used in treatment planning: NCCN Staging comments: Staged on final lumpectomy specimen by Dr. [name].  Right colon cancer Staging form: Colon and Rectum, AJCC 8th Edition - Pathologic stage from 11/01/2019: Stage IIIB (pT3, pN2a, cM0) - Signed by [name], MD on 11/29/2019 Stage prefix: Initial diagnosis Histologic grading system: 4 grade system Histologic grade (G): G2 Residual tumor (R): R0 - None Tumor deposits (TD): Absent Perineural invasion (PNI): Absent Microsatellite instability (MSI): Stable KRAS mutation: Unknown NRAS mutation: Unknown BRAF mutation: Unknown     Malignant neoplasm of female breast (HCC)  05/29/2014 Initial Biopsy   Left breast needle core biopsy: Grade 2, DCIS with calcs. ER+ (100%), PR+ (96%).    06/04/2014 Initial Diagnosis   Left breast DCIS with calcifications, ER 100%, PR 96%   06/10/2014 Breast MRI   Left breast: 2.4 x 1.3 x 1.1 cm area of patchy non-mass enhancement upper outer quadrant includes postbiopsy seroma; Right breast: 1.2 cm previously biopsied stable benign fibroadenoma   06/12/2014 Procedure   Genetic counseling/testing: Identified 1 VUS on CHEK2 gene. Remainder of 17 gene panel tested negative and included: ATM, BARD1, BRCA 1/2, BRIP1, CDH1, CHEK2, EPCAM, MLH1, MSH2, MSH6, NBN, NF1, PALB2, PTEN, RAD50, RAD51C, RAD51D, STK11, and TP53.    07/01/2014 Surgery   Left breast lumpectomy ([facility]): Grade 1, DCIS, spanning 2.3 cm, 1 mm margin, ER 100%, PR 96%   07/31/2014 -  08/28/2014 Radiation Therapy   Adjuvant RT completed [name]). Left breast: Total dose 42.5 Gy over 17 fractions. Left breast boost: Total dose 7.5 Gy over 3 fractions.    09/14/2014 - 01/13/2020 Anti-estrogen oral therapy   Anastrazole 1mg  daily. Planned duration of treatment: 5 years [place]). Completed in 01/2020.    09/25/2014 Survivorship   Survivorship Care Plan given to patient and reviewed with her in person.    03/02/2021 Imaging   CT CAP  IMPRESSION: 1. No findings of active/recurrent malignancy. Partial right hemicolectomy. 2. Endometrial stripe remains mildly thickened, but endometrial biopsy in May was negative for malignancy. 3. Progressive endplate sclerosis and endplate irregularity at T2-3, probably due to degenerative endplate findings. If the has referable upper thoracic pain/symptoms then thoracic spine MRI could be used for further workup. 4. Other imaging findings of potential clinical significance: Mild cardiomegaly. Aortic Atherosclerosis (ICD10-I70.0). Mild mitral valve calcification. Postoperative findings in the left breast with adjacent radiation port anteriorly in the left upper lobe. Tiny pulmonary nodules in the left lower lobe are unchanged from earliest available comparison of 10/17/2019 and probably benign although may merit surveillance. Multilevel lumbar impingement. Mild pelvic floor laxity.   Malignant neoplasm of ascending colon (HCC)  09/19/2019 Imaging   CT AP W contrast 09/19/19  IMPRESSION Fullness in the cecum, cannot exclude a mass. No evidence for metastatic disease is identified.    09/24/2019 Procedure   Colonoscopy by Dr [name] 09/24/19 IMPRESSION 1. The colon was redundant  2. Mild diverticulosis was noted through the entire examined colon 3. Single 12mm polyp was found in the ascending  colon; polypectomy was performed using snare cautery and biopsy forceps 4. Mild diverticulosis was notes in the descending colon and sigmoid colon.  5.  Single polyp was found in the sigmoid colon, polypectomy was performed with cold forceps.  6. Single polyp was found in the rectosigmoid colon; polypectomy was performed with cold snare  7. Small internal hemorrhoids  8. Large mass was found at the cecum; multiple biopsies of the area were performed using cold forceps; injection (tattooing) was performed distal to the mass.    09/24/2019 Initial Biopsy   INTERPRETATION AND DIAGNOSIS:  A. Cecum, biopsy:  Invasive moderately differentiated adenocarcinoma.  see comment  B. Polyp @ ascending colon, polypectomy:  Tubular Adenoma  C. Polyp @ sigmoid colon Polypectomy:  hyperplastic polyp.  D. Polyp @ rectosigmoid colon, Polypectomy:  Hyperplastic Polyp      10/16/2019 Imaging   CT Chest IMPRESSION: 1. Multiple small pulmonary nodules measuring 5 mm or less in size in the lungs. These are nonspecific and are typically considered statistically likely benign. However, given the patient's history of primary malignancy, close attention on follow-up studies is recommended to ensure stability. 2. Aortic atherosclerosis, in addition to right coronary artery disease. Assessment for potential risk factor modification, dietary therapy or pharmacologic therapy may be warranted, if clinically indicated. 3. There are calcifications of the aortic valve and mitral annulus. Echocardiographic correlation for evaluation of potential valvular dysfunction may be warranted if clinically indicated. 4. Small hiatal hernia.   Aortic Atherosclerosis (ICD10-I70.0).   11/01/2019 Initial Diagnosis   Colon cancer (HCC)   11/01/2019 Surgery   LAPAROSCOPIC PARTIAL COLECTOMY by Dr Maisie Fus and Dr Michaell Cowing   11/01/2019 Pathology Results   FINAL MICROSCOPIC DIAGNOSIS:   A. COLON, PROXIMAL RIGHT, COLECTOMY:  - Invasive colonic adenocarcinoma, 5 cm.  - Tumor invades through the muscularis propria into pericolonic tissues.   - Margins of resection are not involved.  -  Metastatic carcinoma in (5) of (13) lymph nodes.  - See oncology table.    MSI Stable  Mismatch repair normal  MLH1 - Preserved nuclear expression (greater 50% tumor expression) MSH2 - Preserved nuclear expression (greater 50% tumor expression) MSH6 - Preserved nuclear expression (greater 50% tumor expression) PMS2 - Preserved nuclear expression (greater 50% tumor expression)   11/01/2019 Cancer Staging   Staging form: Colon and Rectum, AJCC 8th Edition - Pathologic stage from 11/01/2019: Stage IIIB (pT3, pN2a, cM0) - Signed by [name], MD on 11/29/2019   12/10/2019 Procedure   PAC placed 12/10/19   12/17/2019 - 06/01/2020 Chemotherapy   FOLFOX q2weeks starting in 2 weeks starting 12/17/19. Held 01/27/20-02/10/20 due to b/l PE. Oxaliplatin held C11-12 due to neuropathy. Completed on 06/01/20   03/02/2021 Imaging   CT CAP  IMPRESSION: 1. No findings of active/recurrent malignancy. Partial right hemicolectomy. 2. Endometrial stripe remains mildly thickened, but endometrial biopsy in May was negative for malignancy. 3. Progressive endplate sclerosis and endplate irregularity at T2-3, probably due to degenerative endplate findings. If the has referable upper thoracic pain/symptoms then thoracic spine MRI could be used for further workup. 4. Other imaging findings of potential clinical significance: Mild cardiomegaly. Aortic Atherosclerosis (ICD10-I70.0). Mild mitral valve calcification. Postoperative findings in the left breast with adjacent radiation port anteriorly in the left upper lobe. Tiny pulmonary nodules in the left lower lobe are unchanged from earliest available comparison of 10/17/2019 and probably benign although may merit surveillance. Multilevel lumbar impingement. Mild pelvic floor laxity.   08/26/2021 Imaging   EXAM: CT CHEST, ABDOMEN, AND  PELVIS WITH CONTRAST  IMPRESSION: 1. Stable examination without new or progressive findings to suggest local recurrence or metastatic  disease within the chest, abdomen, or pelvis. 2. Hepatomegaly with hepatic steatosis. 3. Sigmoid colonic diverticulosis without findings of acute diverticulitis. 4. Similar prominent endplate sclerosis and irregularity at T2-T3 is most consistent with Modic type endplate degenerative changes. However, if patient has referable upper thoracic pain consider further workup with thoracic spine MRI. 5. Similar thickening of the endometrial stripe measuring up to 8 mm, which was previously biopsied with results negative for malignancy. 6. Aortic Atherosclerosis (ICD10-I70.0).   11/10/2021 PET scan   IMPRESSION: 1. LEFT ovary is increased in size and is intensely hypermetabolic. While physiologic hypermetabolic ovarian tissue is not uncommon, the enlargement and asymmetric activity warrants further evaluation. Consider contrast pelvic MRI vs tissue sampling. 2. No evidence of metastatic colorectal carcinoma otherwise. 3. Post RIGHT hemicolectomy anatomy. 4. Evidence of radiation change in the LEFT upper lobe (remote breast cancer).     12/22/2021 Relapse/Recurrence    FINAL MICROSCOPIC DIAGNOSIS:   A. LEFT OVARY AND FALLOPIAN TUBE, SALPINGO OOPHORECTOMY:  - Metastatic moderately differentiated colonic adenocarcinoma involving left ovary  - Focal ovarian stromal calcification  - Segment of benign fallopian tube   B. UTERUS WITH RIGHT FALLOPIAN TUBE AND OVARY, HYSTERECTOMY AND SALPINGO-OOPHORECTOMY:  - Metastatic moderately differentiated colonic adenocarcinoma involving right ovary  - Focal invasive extensive adenomyosis  - Benign endometrial polyps  - Benign proliferative phase endometrium  - Hydrosalpinx of right fallopian tube  - Focal ovarian stromal calcification   C. PERITONEAL DEPOSITS, ANTERIOR CUL DE SAC, BIOPSY:  - Metastatic mucinous adenocarcinoma, consistent with colorectal primary    COMMENT:  Immunohistochemical stains show that the tumor cells are positive for CK20 and  CDX2 while they are negative for CK7 and PAX8, consistent with above interpretation.    01/24/2022 - 03/23/2022 Chemotherapy   Patient is on Treatment Plan : COLORECTAL FOLFOX q14d x 3 months     04/01/2022 Imaging   CT AP IMPRESSION: 1. New omental metastatic disease. 2. Vague hypoattenuating lesion in the inferior right hepatic lobe is new and also worrisome for metastatic disease. 3. Similar small right lower lobe nodules. Recommend continued attention on follow-up. 4. Steatotic enlarged liver. 5.  Aortic atherosclerosis (ICD10-I70.0).   04/06/2022 -  Chemotherapy   Patient is on Treatment Plan : COLORECTAL FOLFIRI + Bevacizumab q14d     04/21/2022 Imaging    IMPRESSION: 1. Stable chest CT. No evidence of metastatic disease. 2. Stable small pulmonary nodules, considered benign based on stability. 3. Stable postsurgical changes in the left breast and anterior left upper lobe radiation changes. 4. Aortic Atherosclerosis (ICD10-I70.0) and Emphysema (ICD10-J43.9).   04/21/2022 Imaging    IMPRESSION: 1. Small enhancing lesion inferiorly in the right hepatic lobe is typical of metastatic disease and stable from recent abdominal CT. No other definite liver lesions are identified. Mild hepatic steatosis. 2. No other significant abdominal findings. 3. Omental nodularity seen on CT is not well visualized by MRI.    Imaging     06/23/2022 Imaging    IMPRESSION: 1. Hypodense lesion of the inferior right lobe of the liver, hepatic segment VI is diminished in size, consistent with treatment response. 2. Tiny peritoneal and omental nodules identified by prior examination are diminished in size, consistent with treatment response. 3. Multiple tiny pulmonary nodules unchanged, most likely benign and incidental, however continued attention on follow-up warranted in the setting of known metastatic disease. 4. No evidence of  new metastatic disease in the chest, abdomen, or pelvis. 5.  Status post partial right hemicolectomy and reanastomosis. 6. Diffuse mosaic attenuation of the airspaces, consistent with small airways disease. 7. Hepatomegaly.      Discussed the use of AI scribe software for clinical note transcription with the patient, who gave verbal consent to proceed.  History of Present Illness   The patient, a 78 year old female with a history of metastatic colon cancer, presents with back pain that has been present for a couple of months. The pain is described as intense and sharp, occurring suddenly and requiring the patient to squat and stretch to alleviate it. The pain is located on the lower right side of her spine and has recently become more intense. However, the patient notes that the pain seems to be abating slightly. The pain is not debilitating and does not prevent the patient from carrying out her daily activities. It is more noticeable when the patient is walking or standing, particularly if she takes pressure off her right leg. The patient also mentions a previous orthopedic issue related to her knees, which were operated on approximately ten years ago.         All other systems were reviewed with the patient and are negative.  MEDICAL HISTORY:  Past Medical History:  Diagnosis Date   Aortic atherosclerosis (HCC)    Arthritis    feet, lower back   Basal cell carcinoma    arm   Breast cancer of upper-outer quadrant of left female breast (HCC) 06/04/2014   Cataract    immature on the left   Colon cancer (HCC) 08/2019   Diabetes mellitus without complication (HCC)    Diverticulosis    Dizziness    > 6yrs ago;took Antivert    Family history of anesthesia complication    sister slow to wake up with anesthesia   Family history of breast cancer    Family history of colon cancer    Family history of uterine cancer    GERD (gastroesophageal reflux disease)    takes occasional TUMs   History of bronchitis    > 67yrs ago   History of colon  polyps    History of hiatal hernia    Small noted on CT   History of pulmonary embolus (PE)    Hypertension    takes Losartan daily and HCTZ   Iron deficiency anemia    Joint pain    Metastatic disease (HCC) 2023   peritoneum and liver mets   Numbness    to toes on each foot   Peripheral neuropathy    feet and toes   Personal history of radiation therapy    Pulmonary nodules    Noted on CT   Radiation 07/31/14-08/28/14   Left Breast 20 fxs   Seasonal allergies    takes Claritin prn   Urinary frequency    Vitamin D deficiency    takes VIt D daily    SURGICAL HISTORY: Past Surgical History:  Procedure Laterality Date   BREAST BIOPSY Bilateral    BREAST LUMPECTOMY Left    BREAST LUMPECTOMY WITH RADIOACTIVE SEED LOCALIZATION Left 07/01/2014   Procedure: LEFT BREAST LUMPECTOMY WITH RADIOACTIVE SEED LOCALIZATION;  Surgeon: Glenna Fellows, MD;  Location:  SURGERY CENTER;  Service: General;  Laterality: Left;   CATARACT EXTRACTION Right    COLONOSCOPY  09/24/2019   [place]   LAPAROSCOPIC PARTIAL COLECTOMY N/A 11/01/2019   Procedure: LAPAROSCOPIC PARTIAL COLECTOMY;  Surgeon: [name], MD;  Location:  [facility];  Service: General;  Laterality: N/A;   POLYPECTOMY     PORTACATH PLACEMENT N/A 12/10/2019   Procedure: INSERTION PORT-A-CATH ULTRASOUND GUIDED IN RIGHT IJ;  Surgeon: [name], MD;  Location: [facility];  Service: General;  Laterality: N/A;   ROBOTIC ASSISTED TOTAL HYSTERECTOMY Bilateral 12/22/2021   Procedure: XI ROBOTIC ASSISTED TOTAL HYSTERECTOMY WITH BILATERAL SALPINGO OOPHORECTOMY;  Surgeon: Carver Fila, MD;  Location: [facility];  Service: Gynecology;  Laterality: Bilateral;   TOTAL KNEE ARTHROPLASTY Left 10/24/2012   Procedure: TOTAL KNEE ARTHROPLASTY;  Surgeon: Nestor Lewandowsky, MD;  Location: [facility];  Service: Orthopedics;  Laterality: Left;   TOTAL KNEE ARTHROPLASTY Right 01/09/2013   Procedure: TOTAL KNEE ARTHROPLASTY;  Surgeon: Nestor Lewandowsky, MD;   Location: [facility];  Service: Orthopedics;  Laterality: Right;   TUBAL LIGATION      I have reviewed the social history and family history with the patient and they are unchanged from previous note.  ALLERGIES:  is allergic to oxycodone.  MEDICATIONS:  Current Outpatient Medications  Medication Sig Dispense Refill   apixaban (ELIQUIS) 5 MG TABS tablet Take 1 tablet (5 mg total) by mouth 2 (two) times daily. 60 tablet 5   b complex vitamins capsule Take 1 capsule by mouth daily.     Cholecalciferol (VITAMIN D) 2000 UNITS CAPS Take 2,000 Units by mouth daily.      ibuprofen (ADVIL) 200 MG tablet Take 200 mg by mouth daily as needed (pain).     lidocaine-prilocaine (EMLA) cream Apply 1 Application topically as needed. 30 g 1   loratadine (CLARITIN) 10 MG tablet Take 10 mg by mouth daily as needed for allergies.     losartan (COZAAR) 25 MG tablet Take 1 tablet (25 mg total) by mouth daily. 90 tablet 3   metFORMIN (GLUCOPHAGE) 500 MG tablet Take 1 tablet (500 mg total) by mouth in the morning and at bedtime. 180 tablet 3   Multiple Vitamins-Minerals (MULTIVITAMIN WITH MINERALS) tablet Take 1 tablet by mouth daily.     omeprazole (PRILOSEC) 20 MG capsule Take 20 mg by mouth daily before breakfast.      ondansetron (ZOFRAN) 8 MG tablet Take 1 tablet (8 mg total) by mouth every 8 (eight) hours as needed for nausea or vomiting. Start on the third day after chemotherapy. 30 tablet 1   potassium chloride (KLOR-CON) 10 MEQ tablet Take 1 tablet by mouth twice daily 60 tablet 0   pravastatin (PRAVACHOL) 10 MG tablet Take 1 tablet (10 mg total) by mouth daily. 90 tablet 3   pregabalin (LYRICA) 25 MG capsule TAKE 1 CAPSULE BY MOUTH IN THE MORNING IN  ADDITION  TO THE 50 MG CAPSULE AT  BEDTIME 30 capsule 0   pregabalin (LYRICA) 50 MG capsule Take 1 capsule (50 mg total) by mouth at bedtime. Take in addition to 25 mg in the morning 60 capsule 2   prochlorperazine (COMPAZINE) 10 MG tablet Take 1 tablet (10 mg  total) by mouth every 6 (six) hours as needed for nausea or vomiting. 30 tablet 1   traMADol (ULTRAM) 50 MG tablet Take 1 tablet (50 mg total) by mouth every 6 (six) hours as needed. 30 tablet 0   triamcinolone ointment (KENALOG) 0.5 % Apply 1 Application topically 2 (two) times daily. 30 g 0   No current facility-administered medications for this visit.   Facility-Administered Medications Ordered in Other Visits  Medication Dose Route Frequency Provider Last Rate Last Admin   0.9 %  sodium chloride infusion   Intravenous Continuous [name], MD   Stopped at 07/10/23 1328   fluorouracil (ADRUCIL) 5,000 mg in sodium chloride 0.9 % 150 mL chemo infusion  2,400 mg/m2 (Treatment Plan Recorded) Intravenous 1 day or 1 dose [name], MD   Infusion Verify at 07/10/23 1410   heparin lock flush 100 unit/mL  500 Units Intracatheter Once PRN [name], MD       sodium chloride flush (NS) 0.9 % injection 10 mL  10 mL Intracatheter PRN [name], MD        PHYSICAL EXAMINATION: ECOG PERFORMANCE STATUS: 1 - Symptomatic but completely ambulatory  Vitals:   07/10/23 0942 07/10/23 0945  BP: (!) 190/74 (!) 160/80  Pulse: 71   Resp: 17   Temp: (!) 97.4 F (36.3 C)   SpO2: 99%    Wt Readings from Last 3 Encounters:  07/10/23 210 lb 6.4 oz (95.4 kg)  06/26/23 216 lb 3.2 oz (98.1 kg)  06/12/23 216 lb 11.2 oz (98.3 kg)     GENERAL:alert, no distress and comfortable SKIN: skin color, texture, turgor are normal, no rashes or significant lesions EYES: normal, Conjunctiva are pink and non-injected, sclera clear NECK: supple, thyroid normal size, non-tender, without nodularity LYMPH:  no palpable lymphadenopathy in the cervical, axillary  LUNGS: clear to auscultation and percussion with normal breathing effort HEART: regular rate & rhythm and no murmurs and no lower extremity edema ABDOMEN:abdomen soft, non-tender and normal bowel sounds Musculoskeletal:no cyanosis of digits and no clubbing  NEURO:  alert & oriented x 3 with fluent speech, no focal motor/sensory deficits       LABORATORY DATA:  I have reviewed the data as listed    Latest Ref Rng & Units 07/10/2023    9:17 AM 06/26/2023    8:16 AM 06/12/2023    8:20 AM  CBC  WBC 4.0 - 10.5 K/uL 12.3  12.0  14.8   Hemoglobin 12.0 - 15.0 g/dL 93.2  35.5  73.2   Hematocrit 36.0 - 46.0 % 35.0  32.3  32.9   Platelets 150 - 400 K/uL 197  208  240         Latest Ref Rng & Units 07/10/2023    9:17 AM 06/26/2023    8:16 AM 06/12/2023    8:20 AM  CMP  Glucose 70 - 99 mg/dL 202  542  706   BUN 8 - 23 mg/dL 15  16  17    Creatinine 0.44 - 1.00 mg/dL 2.37  6.28  3.15   Sodium 135 - 145 mmol/L 143  145  141   Potassium 3.5 - 5.1 mmol/L 3.9  3.8  4.4   Chloride 98 - 111 mmol/L 110  113  108   CO2 22 - 32 mmol/L 27  26  26    Calcium 8.9 - 10.3 mg/dL 9.0  9.0  9.2   Total Protein 6.5 - 8.1 g/dL 6.4  6.2  6.3   Total Bilirubin 0.0 - 1.2 mg/dL 0.6  0.7  0.6   Alkaline Phos 38 - 126 U/L 171  140  140   AST 15 - 41 U/L 32  18  22   ALT 0 - 44 U/L 21  15  16        RADIOGRAPHIC STUDIES: I have personally reviewed the radiological images as listed and agreed with the findings in the report. No results found.    Orders Placed This Encounter  Procedures   CBC with Differential (  Cancer Center Only)    Standing Status:   Future    Expected Date:   08/07/2023    Expiration Date:   08/06/2024   CMP (Cancer Center only)    Standing Status:   Future    Expected Date:   08/07/2023    Expiration Date:   08/06/2024   CBC with Differential (Cancer Center Only)    Standing Status:   Future    Expected Date:   08/21/2023    Expiration Date:   08/20/2024   CMP (Cancer Center only)    Standing Status:   Future    Expected Date:   08/21/2023    Expiration Date:   08/20/2024   CBC with Differential (Cancer Center Only)    Standing Status:   Future    Expected Date:   09/04/2023    Expiration Date:   09/03/2024   CMP (Cancer Center only)    Standing  Status:   Future    Expected Date:   09/04/2023    Expiration Date:   09/03/2024   AMB referral to orthopedics    Referral Priority:   Routine    Referral Type:   Consultation    Number of Visits Requested:   1   All questions were answered. The patient knows to call the clinic with any problems, questions or concerns. No barriers to learning was detected. The total time spent in the appointment was 25 minutes.     [name], MD 07/10/2023

## 2023-07-10 NOTE — Patient Instructions (Signed)
 [facility] - A DEPT OF [facility]  Discharge Instructions: Thank you for choosing [facility] to provide your oncology and hematology care.   If you have a lab appointment with the Cancer Center, please go directly to the Cancer Center and check in at the registration area.   Wear comfortable clothing and clothing appropriate for easy access to any Portacath or PICC line.   We strive to give you quality time with your provider. You may need to reschedule your appointment if you arrive late (15 or more minutes).  Arriving late affects you and other patients whose appointments are after yours.  Also, if you miss three or more appointments without notifying the office, you may be dismissed from the clinic at the provider's discretion.      For prescription refill requests, have your pharmacy contact our office and allow 72 hours for refills to be completed.    Today you received the following chemotherapy and/or immunotherapy agents: Mvasi, Irinotecan, Leucovorin      To help prevent nausea and vomiting after your treatment, we encourage you to take your nausea medication as directed.  BELOW ARE SYMPTOMS THAT SHOULD BE REPORTED IMMEDIATELY: *FEVER GREATER THAN 100.4 F (38 C) OR HIGHER *CHILLS OR SWEATING *NAUSEA AND VOMITING THAT IS NOT CONTROLLED WITH YOUR NAUSEA MEDICATION *UNUSUAL SHORTNESS OF BREATH *UNUSUAL BRUISING OR BLEEDING *URINARY PROBLEMS (pain or burning when urinating, or frequent urination) *BOWEL PROBLEMS (unusual diarrhea, constipation, pain near the anus) TENDERNESS IN MOUTH AND THROAT WITH OR WITHOUT PRESENCE OF ULCERS (sore throat, sores in mouth, or a toothache) UNUSUAL RASH, SWELLING OR PAIN  UNUSUAL VAGINAL DISCHARGE OR ITCHING   Items with * indicate a potential emergency and should be followed up as soon as possible or go to the Emergency Department if any problems should occur.  Please show the CHEMOTHERAPY ALERT  CARD or IMMUNOTHERAPY ALERT CARD at check-in to the Emergency Department and triage nurse.  Should you have questions after your visit or need to cancel or reschedule your appointment, please contact [facility] - A DE[facility]  Dept: [phone]  and follow the prompts.  Office hours are 8:00 a.m. to 4:30 p.m. Monday - Friday. Please note that voicemails left after 4:00 p.m. may not be returned until the following business day.  We are closed weekends and major holidays. You have access to a nurse at all times for urgent questions. Please call the main number to the clinic Dept: [phone] and follow the prompts.   For any non-urgent questions, you may also contact your provider using MyChart. We now offer e-Visits for anyone 70 and older to request care online for non-urgent symptoms. For details visit [email or web address].   Also download the MyChart app! Go to the app store, search "MyChart", open the app, select [place], and log in with your MyChart username and password.

## 2023-07-11 NOTE — Progress Notes (Signed)
 Faxed referral to [facility] [phone]).  Fax confirmation received.

## 2023-07-12 ENCOUNTER — Inpatient Hospital Stay: Payer: Medicare Other

## 2023-07-12 VITALS — BP 130/50 | HR 62 | Temp 98.8°F | Resp 17

## 2023-07-12 DIAGNOSIS — C787 Secondary malignant neoplasm of liver and intrahepatic bile duct: Secondary | ICD-10-CM | POA: Diagnosis not present

## 2023-07-12 DIAGNOSIS — C786 Secondary malignant neoplasm of retroperitoneum and peritoneum: Secondary | ICD-10-CM | POA: Diagnosis not present

## 2023-07-12 DIAGNOSIS — C7963 Secondary malignant neoplasm of bilateral ovaries: Secondary | ICD-10-CM | POA: Diagnosis not present

## 2023-07-12 DIAGNOSIS — Z5189 Encounter for other specified aftercare: Secondary | ICD-10-CM | POA: Diagnosis not present

## 2023-07-12 DIAGNOSIS — Z5111 Encounter for antineoplastic chemotherapy: Secondary | ICD-10-CM | POA: Diagnosis not present

## 2023-07-12 DIAGNOSIS — C182 Malignant neoplasm of ascending colon: Secondary | ICD-10-CM

## 2023-07-12 MED ORDER — SODIUM CHLORIDE 0.9% FLUSH
10.0000 mL | INTRAVENOUS | Status: DC | PRN
  Administered 2023-07-12: 10 mL

## 2023-07-12 MED ORDER — PEGFILGRASTIM-CBQV 6 MG/0.6ML ~~LOC~~ SOSY
6.0000 mg | PREFILLED_SYRINGE | Freq: Once | SUBCUTANEOUS | Status: AC
  Administered 2023-07-12: 6 mg via SUBCUTANEOUS
  Filled 2023-07-12: qty 0.6

## 2023-07-12 MED ORDER — HEPARIN SOD (PORK) LOCK FLUSH 100 UNIT/ML IV SOLN
500.0000 [IU] | Freq: Once | INTRAVENOUS | Status: AC | PRN
  Administered 2023-07-12: 500 [IU]

## 2023-07-16 ENCOUNTER — Other Ambulatory Visit: Payer: Self-pay | Admitting: Nurse Practitioner

## 2023-07-16 DIAGNOSIS — R200 Anesthesia of skin: Secondary | ICD-10-CM

## 2023-07-17 ENCOUNTER — Other Ambulatory Visit: Payer: Self-pay

## 2023-07-17 ENCOUNTER — Other Ambulatory Visit: Payer: Self-pay | Admitting: Hematology

## 2023-07-17 DIAGNOSIS — R200 Anesthesia of skin: Secondary | ICD-10-CM

## 2023-07-17 MED ORDER — PREGABALIN 25 MG PO CAPS: 25.0000 mg | ORAL_CAPSULE | Freq: Every day | ORAL | 1 refills | Status: DC

## 2023-07-23 ENCOUNTER — Other Ambulatory Visit: Payer: Self-pay | Admitting: Nurse Practitioner

## 2023-07-23 DIAGNOSIS — C182 Malignant neoplasm of ascending colon: Secondary | ICD-10-CM

## 2023-07-23 NOTE — Progress Notes (Unsigned)
 Patient Care Team: [name], MD as PCP - General (Family Medicine) [name], MD as Consulting Physician (General Surgery) [name], MD as Consulting Physician (Hematology) [name], MD as Consulting Physician (General Surgery) [name], MD as Consulting Physician (Hematology and Oncology) Ponciano Ort, [facility] Day:  07/23/2023  Referring physician: [name], MD  ASSESSMENT & PLAN:   Assessment & Plan: Malignant neoplasm of ascending colon [facility]) pT3N2aM0 stage IIB, MSS, metastatic to b/l ovaries and peritoneum and liver, KRAS mutation G12V (+)  -Initially diagnosed in 08/2019, s/p resection with Dr Maisie Fus on 11/01/19. Path showed overall Stage IIIB cancer.  -s/p 6 months adjuvant FOLFOX, completed 06/01/20, oxaliplatin held for final 2 cycles due to neuropathy.  -s/p BSO and hysterectomy on 12/22/21 with Dr. Pricilla Holm. Path revealed metastatic moderately differentiated colonic adenocarcinoma involving both ovaries and peritoneum -she restarted FOLFOX on 01/24/22. -Due to disease progression, chemotherapy changed to second line FOLFIRI and bevacizumab on April 06, 2022. She has been tolerating moderately well, better now with dose reduction, will continue -Restaging CT scan from 09/27/2022 showed continous response in liver and peritoneal metastasis, no other new lesions.   -We discussed maintenance therapy option in future  -due to her PE in 10/2022, will hold beva for 3 months  -restaging CT 12/31/2022 showed stable disease and live met is not well defined on CT. She restarted beva on 01/17/23  -restaging CT 06/19/2023 showed overall stable disease (stable small lung nodules, no other evidence of cancer) -She is due for PET scan in May 2025.    The patient understands the plans discussed today and is in agreement with them.  She knows to contact our office if she develops concerns prior to her next appointment.  I provided *** minutes of  face-to-face time during this encounter and > 50% was spent counseling as documented under my assessment and plan.    Carlean Jews, NP  [facility] - A DEPT OF [facility] [address] [place] [address] Dept: [phone] Dept Fax: [phone]   No orders of the defined types were placed in this encounter.     CHIEF COMPLAINT:  CC: Cancer of ascending colon  Current Treatment: FOLFIRI and bevacizumab  INTERVAL HISTORY:  Orvilla is here today for repeat clinical assessment.  Patient was last seen on 07/10/2023 by Dr. Mosetta Putt.  Continues chemotherapy with FOLFIRI and bevacizumab every 2 weeks.  PET scan to be scheduled for May 2025 She denies fevers or chills. She denies pain. Her appetite is good. Her weight {Weight change:10426}.  I have reviewed the past medical history, past surgical history, social history and family history with the patient and they are unchanged from previous note.  ALLERGIES:  is allergic to oxycodone.  MEDICATIONS:  Current Outpatient Medications  Medication Sig Dispense Refill   apixaban (ELIQUIS) 5 MG TABS tablet Take 1 tablet (5 mg total) by mouth 2 (two) times daily. 60 tablet 5   b complex vitamins capsule Take 1 capsule by mouth daily.     Cholecalciferol (VITAMIN D) 2000 UNITS CAPS Take 2,000 Units by mouth daily.      ibuprofen (ADVIL) 200 MG tablet Take 200 mg by mouth daily as needed (pain).     lidocaine-prilocaine (EMLA) cream Apply 1 Application topically as needed. 30 g 1   loratadine (CLARITIN) 10 MG tablet Take 10 mg by mouth daily as needed for  allergies.     losartan (COZAAR) 25 MG tablet Take 1 tablet (25 mg total) by mouth daily. 90 tablet 3   metFORMIN (GLUCOPHAGE) 500 MG tablet Take 1 tablet (500 mg total) by mouth in the morning and at bedtime. 180 tablet 3   Multiple Vitamins-Minerals (MULTIVITAMIN WITH MINERALS) tablet Take 1 tablet by mouth daily.      omeprazole (PRILOSEC) 20 MG capsule Take 20 mg by mouth daily before breakfast.      ondansetron (ZOFRAN) 8 MG tablet Take 1 tablet (8 mg total) by mouth every 8 (eight) hours as needed for nausea or vomiting. Start on the third day after chemotherapy. 30 tablet 1   potassium chloride (KLOR-CON) 10 MEQ tablet Take 1 tablet by mouth twice daily 60 tablet 0   pravastatin (PRAVACHOL) 10 MG tablet Take 1 tablet (10 mg total) by mouth daily. 90 tablet 3   pregabalin (LYRICA) 25 MG capsule Take 1 capsule (25 mg total) by mouth daily. In addition to 50mg  at bedtime 30 capsule 1   pregabalin (LYRICA) 50 MG capsule Take 1 capsule (50 mg total) by mouth at bedtime. Take in addition to 25 mg in the morning 60 capsule 2   prochlorperazine (COMPAZINE) 10 MG tablet Take 1 tablet (10 mg total) by mouth every 6 (six) hours as needed for nausea or vomiting. 30 tablet 1   traMADol (ULTRAM) 50 MG tablet Take 1 tablet (50 mg total) by mouth every 6 (six) hours as needed. 30 tablet 0   triamcinolone ointment (KENALOG) 0.5 % Apply 1 Application topically 2 (two) times daily. 30 g 0   No current facility-administered medications for this visit.    HISTORY OF PRESENT ILLNESS:   Oncology History Overview Note   Cancer Staging  Malignant neoplasm of female breast [facility]) Staging form: Breast, AJCC 7th Edition - Clinical stage from 06/11/2014: Stage 0 (Tis (DCIS), N0, M0) - Unsigned Staged by: Pathologist and managing physician Laterality: Left Estrogen receptor status: Positive Progesterone receptor status: Positive Stage used in treatment planning: Yes National guidelines used in treatment planning: Yes Type of national guideline used in treatment planning: NCCN - Pathologic stage from 07/03/2014: Stage Unknown (Tis (DCIS), NX, cM0) - Signed by [name], MD on 07/10/2014 Staged by: Pathologist Laterality: Left Estrogen receptor status: Positive Progesterone receptor status: Positive Stage used in treatment  planning: Yes National guidelines used in treatment planning: Yes Type of national guideline used in treatment planning: NCCN Staging comments: Staged on final lumpectomy specimen by Dr. [name].  Right colon cancer Staging form: Colon and Rectum, AJCC 8th Edition - Pathologic stage from 11/01/2019: Stage IIIB (pT3, pN2a, cM0) - Signed by [name], MD on 11/29/2019 Stage prefix: Initial diagnosis Histologic grading system: 4 grade system Histologic grade (G): G2 Residual tumor (R): R0 - None Tumor deposits (TD): Absent Perineural invasion (PNI): Absent Microsatellite instability (MSI): Stable KRAS mutation: Unknown NRAS mutation: Unknown BRAF mutation: Unknown     Malignant neoplasm of female breast (HCC)  05/29/2014 Initial Biopsy   Left breast needle core biopsy: Grade 2, DCIS with calcs. ER+ (100%), PR+ (96%).    06/04/2014 Initial Diagnosis   Left breast DCIS with calcifications, ER 100%, PR 96%   06/10/2014 Breast MRI   Left breast: 2.4 x 1.3 x 1.1 cm area of patchy non-mass enhancement upper outer quadrant includes postbiopsy seroma; Right breast: 1.2 cm previously biopsied stable benign fibroadenoma   06/12/2014 Procedure   Genetic counseling/testing: Identified 1 VUS on CHEK2 gene. Remainder  of 17 gene panel tested negative and included: ATM, BARD1, BRCA 1/2, BRIP1, CDH1, CHEK2, EPCAM, MLH1, MSH2, MSH6, NBN, NF1, PALB2, PTEN, RAD50, RAD51C, RAD51D, STK11, and TP53.    07/01/2014 Surgery   Left breast lumpectomy ([facility]): Grade 1, DCIS, spanning 2.3 cm, 1 mm margin, ER 100%, PR 96%   07/31/2014 - 08/28/2014 Radiation Therapy   Adjuvant RT completed [name]). Left breast: Total dose 42.5 Gy over 17 fractions. Left breast boost: Total dose 7.5 Gy over 3 fractions.    09/14/2014 - 01/13/2020 Anti-estrogen oral therapy   Anastrazole 1mg  daily. Planned duration of treatment: 5 years [place]). Completed in 01/2020.    09/25/2014 Survivorship   Survivorship Care Plan given to patient  and reviewed with her in person.    03/02/2021 Imaging   CT CAP  IMPRESSION: 1. No findings of active/recurrent malignancy. Partial right hemicolectomy. 2. Endometrial stripe remains mildly thickened, but endometrial biopsy in May was negative for malignancy. 3. Progressive endplate sclerosis and endplate irregularity at T2-3, probably due to degenerative endplate findings. If the has referable upper thoracic pain/symptoms then thoracic spine MRI could be used for further workup. 4. Other imaging findings of potential clinical significance: Mild cardiomegaly. Aortic Atherosclerosis (ICD10-I70.0). Mild mitral valve calcification. Postoperative findings in the left breast with adjacent radiation port anteriorly in the left upper lobe. Tiny pulmonary nodules in the left lower lobe are unchanged from earliest available comparison of 10/17/2019 and probably benign although may merit surveillance. Multilevel lumbar impingement. Mild pelvic floor laxity.   Malignant neoplasm of ascending colon (HCC)  09/19/2019 Imaging   CT AP W contrast 09/19/19  IMPRESSION Fullness in the cecum, cannot exclude a mass. No evidence for metastatic disease is identified.    09/24/2019 Procedure   Colonoscopy by Dr [name] 09/24/19 IMPRESSION 1. The colon was redundant  2. Mild diverticulosis was noted through the entire examined colon 3. Single 12mm polyp was found in the ascending colon; polypectomy was performed using snare cautery and biopsy forceps 4. Mild diverticulosis was notes in the descending colon and sigmoid colon.  5. Single polyp was found in the sigmoid colon, polypectomy was performed with cold forceps.  6. Single polyp was found in the rectosigmoid colon; polypectomy was performed with cold snare  7. Small internal hemorrhoids  8. Large mass was found at the cecum; multiple biopsies of the area were performed using cold forceps; injection (tattooing) was performed distal to the mass.     09/24/2019 Initial Biopsy   INTERPRETATION AND DIAGNOSIS:  A. Cecum, biopsy:  Invasive moderately differentiated adenocarcinoma.  see comment  B. Polyp @ ascending colon, polypectomy:  Tubular Adenoma  C. Polyp @ sigmoid colon Polypectomy:  hyperplastic polyp.  D. Polyp @ rectosigmoid colon, Polypectomy:  Hyperplastic Polyp      10/16/2019 Imaging   CT Chest IMPRESSION: 1. Multiple small pulmonary nodules measuring 5 mm or less in size in the lungs. These are nonspecific and are typically considered statistically likely benign. However, given the patient's history of primary malignancy, close attention on follow-up studies is recommended to ensure stability. 2. Aortic atherosclerosis, in addition to right coronary artery disease. Assessment for potential risk factor modification, dietary therapy or pharmacologic therapy may be warranted, if clinically indicated. 3. There are calcifications of the aortic valve and mitral annulus. Echocardiographic correlation for evaluation of potential valvular dysfunction may be warranted if clinically indicated. 4. Small hiatal hernia.   Aortic Atherosclerosis (ICD10-I70.0).   11/01/2019 Initial Diagnosis   Colon cancer (HCC)  11/01/2019 Surgery   LAPAROSCOPIC PARTIAL COLECTOMY by Dr Maisie Fus and Dr Michaell Cowing   11/01/2019 Pathology Results   FINAL MICROSCOPIC DIAGNOSIS:   A. COLON, PROXIMAL RIGHT, COLECTOMY:  - Invasive colonic adenocarcinoma, 5 cm.  - Tumor invades through the muscularis propria into pericolonic tissues.   - Margins of resection are not involved.  - Metastatic carcinoma in (5) of (13) lymph nodes.  - See oncology table.    MSI Stable  Mismatch repair normal  MLH1 - Preserved nuclear expression (greater 50% tumor expression) MSH2 - Preserved nuclear expression (greater 50% tumor expression) MSH6 - Preserved nuclear expression (greater 50% tumor expression) PMS2 - Preserved nuclear expression (greater 50% tumor  expression)   11/01/2019 Cancer Staging   Staging form: Colon and Rectum, AJCC 8th Edition - Pathologic stage from 11/01/2019: Stage IIIB (pT3, pN2a, cM0) - Signed by [name], MD on 11/29/2019   12/10/2019 Procedure   PAC placed 12/10/19   12/17/2019 - 06/01/2020 Chemotherapy   FOLFOX q2weeks starting in 2 weeks starting 12/17/19. Held 01/27/20-02/10/20 due to b/l PE. Oxaliplatin held C11-12 due to neuropathy. Completed on 06/01/20   03/02/2021 Imaging   CT CAP  IMPRESSION: 1. No findings of active/recurrent malignancy. Partial right hemicolectomy. 2. Endometrial stripe remains mildly thickened, but endometrial biopsy in May was negative for malignancy. 3. Progressive endplate sclerosis and endplate irregularity at T2-3, probably due to degenerative endplate findings. If the has referable upper thoracic pain/symptoms then thoracic spine MRI could be used for further workup. 4. Other imaging findings of potential clinical significance: Mild cardiomegaly. Aortic Atherosclerosis (ICD10-I70.0). Mild mitral valve calcification. Postoperative findings in the left breast with adjacent radiation port anteriorly in the left upper lobe. Tiny pulmonary nodules in the left lower lobe are unchanged from earliest available comparison of 10/17/2019 and probably benign although may merit surveillance. Multilevel lumbar impingement. Mild pelvic floor laxity.   08/26/2021 Imaging   EXAM: CT CHEST, ABDOMEN, AND PELVIS WITH CONTRAST  IMPRESSION: 1. Stable examination without new or progressive findings to suggest local recurrence or metastatic disease within the chest, abdomen, or pelvis. 2. Hepatomegaly with hepatic steatosis. 3. Sigmoid colonic diverticulosis without findings of acute diverticulitis. 4. Similar prominent endplate sclerosis and irregularity at T2-T3 is most consistent with Modic type endplate degenerative changes. However, if patient has referable upper thoracic pain consider further  workup with thoracic spine MRI. 5. Similar thickening of the endometrial stripe measuring up to 8 mm, which was previously biopsied with results negative for malignancy. 6. Aortic Atherosclerosis (ICD10-I70.0).   11/10/2021 PET scan   IMPRESSION: 1. LEFT ovary is increased in size and is intensely hypermetabolic. While physiologic hypermetabolic ovarian tissue is not uncommon, the enlargement and asymmetric activity warrants further evaluation. Consider contrast pelvic MRI vs tissue sampling. 2. No evidence of metastatic colorectal carcinoma otherwise. 3. Post RIGHT hemicolectomy anatomy. 4. Evidence of radiation change in the LEFT upper lobe (remote breast cancer).     12/22/2021 Relapse/Recurrence    FINAL MICROSCOPIC DIAGNOSIS:   A. LEFT OVARY AND FALLOPIAN TUBE, SALPINGO OOPHORECTOMY:  - Metastatic moderately differentiated colonic adenocarcinoma involving left ovary  - Focal ovarian stromal calcification  - Segment of benign fallopian tube   B. UTERUS WITH RIGHT FALLOPIAN TUBE AND OVARY, HYSTERECTOMY AND SALPINGO-OOPHORECTOMY:  - Metastatic moderately differentiated colonic adenocarcinoma involving right ovary  - Focal invasive extensive adenomyosis  - Benign endometrial polyps  - Benign proliferative phase endometrium  - Hydrosalpinx of right fallopian tube  - Focal ovarian stromal calcification   C.  PERITONEAL DEPOSITS, ANTERIOR CUL DE SAC, BIOPSY:  - Metastatic mucinous adenocarcinoma, consistent with colorectal primary    COMMENT:  Immunohistochemical stains show that the tumor cells are positive for CK20 and CDX2 while they are negative for CK7 and PAX8, consistent with above interpretation.    01/24/2022 - 03/23/2022 Chemotherapy   Patient is on Treatment Plan : COLORECTAL FOLFOX q14d x 3 months     04/01/2022 Imaging   CT AP IMPRESSION: 1. New omental metastatic disease. 2. Vague hypoattenuating lesion in the inferior right hepatic lobe is new and also worrisome  for metastatic disease. 3. Similar small right lower lobe nodules. Recommend continued attention on follow-up. 4. Steatotic enlarged liver. 5.  Aortic atherosclerosis (ICD10-I70.0).   04/06/2022 -  Chemotherapy   Patient is on Treatment Plan : COLORECTAL FOLFIRI + Bevacizumab q14d     04/21/2022 Imaging    IMPRESSION: 1. Stable chest CT. No evidence of metastatic disease. 2. Stable small pulmonary nodules, considered benign based on stability. 3. Stable postsurgical changes in the left breast and anterior left upper lobe radiation changes. 4. Aortic Atherosclerosis (ICD10-I70.0) and Emphysema (ICD10-J43.9).   04/21/2022 Imaging    IMPRESSION: 1. Small enhancing lesion inferiorly in the right hepatic lobe is typical of metastatic disease and stable from recent abdominal CT. No other definite liver lesions are identified. Mild hepatic steatosis. 2. No other significant abdominal findings. 3. Omental nodularity seen on CT is not well visualized by MRI.    Imaging     06/23/2022 Imaging    IMPRESSION: 1. Hypodense lesion of the inferior right lobe of the liver, hepatic segment VI is diminished in size, consistent with treatment response. 2. Tiny peritoneal and omental nodules identified by prior examination are diminished in size, consistent with treatment response. 3. Multiple tiny pulmonary nodules unchanged, most likely benign and incidental, however continued attention on follow-up warranted in the setting of known metastatic disease. 4. No evidence of new metastatic disease in the chest, abdomen, or pelvis. 5. Status post partial right hemicolectomy and reanastomosis. 6. Diffuse mosaic attenuation of the airspaces, consistent with small airways disease. 7. Hepatomegaly.       REVIEW OF SYSTEMS:   Constitutional: Denies fevers, chills or abnormal weight loss Eyes: Denies blurriness of vision Ears, nose, mouth, throat, and face: Denies mucositis or sore  throat Respiratory: Denies cough, dyspnea or wheezes Cardiovascular: Denies palpitation, chest discomfort or lower extremity swelling Gastrointestinal:  Denies nausea, heartburn or change in bowel habits Skin: Denies abnormal skin rashes Lymphatics: Denies new lymphadenopathy or easy bruising Neurological:Denies numbness, tingling or new weaknesses Behavioral/Psych: Mood is stable, no new changes  All other systems were reviewed with the patient and are negative.   VITALS:  There were no vitals taken for this visit.  Wt Readings from Last 3 Encounters:  07/10/23 210 lb 6.4 oz (95.4 kg)  06/26/23 216 lb 3.2 oz (98.1 kg)  06/12/23 216 lb 11.2 oz (98.3 kg)    There is no height or weight on file to calculate BMI.  Performance status (ECOG): {CHL ONC [id number]  PHYSICAL EXAM:   GENERAL:alert, no distress and comfortable SKIN: skin color, texture, turgor are normal, no rashes or significant lesions EYES: normal, Conjunctiva are pink and non-injected, sclera clear OROPHARYNX:no exudate, no erythema and lips, buccal mucosa, and tongue normal  NECK: supple, thyroid normal size, non-tender, without nodularity LYMPH:  no palpable lymphadenopathy in the cervical, axillary or inguinal LUNGS: clear to auscultation and percussion with normal breathing effort HEART: regular rate &  rhythm and no murmurs and no lower extremity edema ABDOMEN:abdomen soft, non-tender and normal bowel sounds Musculoskeletal:no cyanosis of digits and no clubbing  NEURO: alert & oriented x 3 with fluent speech, no focal motor/sensory deficits  LABORATORY DATA:  I have reviewed the data as listed    Component Value Date/Time   NA 143 07/10/2023 0917   NA 144 10/03/2014 0914   K 3.9 07/10/2023 0917   K 4.6 10/03/2014 0914   CL 110 07/10/2023 0917   CO2 27 07/10/2023 0917   CO2 29 10/03/2014 0914   GLUCOSE 115 (H) 07/10/2023 0917   GLUCOSE 133 10/03/2014 0914   BUN 15 07/10/2023 0917   BUN 17.8  10/03/2014 0914   CREATININE 0.85 07/10/2023 0917   CREATININE 0.8 10/03/2014 0914   CALCIUM 9.0 07/10/2023 0917   CALCIUM 9.7 10/03/2014 0914   PROT 6.4 (L) 07/10/2023 0917   PROT 7.0 10/03/2014 0914   ALBUMIN 4.0 07/10/2023 0917   ALBUMIN 3.7 10/03/2014 0914   AST 32 07/10/2023 0917   AST 18 10/03/2014 0914   ALT 21 07/10/2023 0917   ALT 13 10/03/2014 0914   ALKPHOS 171 (H) 07/10/2023 0917   ALKPHOS 74 10/03/2014 0914   BILITOT 0.6 07/10/2023 0917   BILITOT 0.98 10/03/2014 0914   GFRNONAA >60 07/10/2023 0917   GFRAA >60 01/30/2020 0429   GFRAA >60 01/27/2020 0834    No results found for: "SPEP", "UPEP"  Lab Results  Component Value Date   WBC 12.3 (H) 07/10/2023   NEUTROABS 7.1 07/10/2023   HGB 11.0 (L) 07/10/2023   HCT 35.0 (L) 07/10/2023   MCV 102.0 (H) 07/10/2023   PLT 197 07/10/2023      Chemistry      Component Value Date/Time   NA 143 07/10/2023 0917   NA 144 10/03/2014 0914   K 3.9 07/10/2023 0917   K 4.6 10/03/2014 0914   CL 110 07/10/2023 0917   CO2 27 07/10/2023 0917   CO2 29 10/03/2014 0914   BUN 15 07/10/2023 0917   BUN 17.8 10/03/2014 0914   CREATININE 0.85 07/10/2023 0917   CREATININE 0.8 10/03/2014 0914      Component Value Date/Time   CALCIUM 9.0 07/10/2023 0917   CALCIUM 9.7 10/03/2014 0914   ALKPHOS 171 (H) 07/10/2023 0917   ALKPHOS 74 10/03/2014 0914   AST 32 07/10/2023 0917   AST 18 10/03/2014 0914   ALT 21 07/10/2023 0917   ALT 13 10/03/2014 0914   BILITOT 0.6 07/10/2023 0917   BILITOT 0.98 10/03/2014 0914       RADIOGRAPHIC STUDIES: I have personally reviewed the radiological images as listed and agreed with the findings in the report. MR Lumbar Spine W Wo Contrast Result Date: 07/10/2023 CLINICAL DATA:  Low back pain. Cancer suspected. Patient with metastatic colon cancer. EXAM: MRI LUMBAR SPINE WITHOUT AND WITH CONTRAST TECHNIQUE: Multiplanar and multiecho pulse sequences of the lumbar spine were obtained without and with  intravenous contrast. CONTRAST:  10mL GADAVIST GADOBUTROL 1 MMOL/ML IV SOLN COMPARISON:  CT 06/23/2023 FINDINGS: Segmentation:  5 lumbar type vertebral bodies. Alignment: Scoliotic curvature convex to the left with the apex at L2. 2 mm degenerative anterolisthesis L2-3. Vertebrae: Benign appearing lipoma or hemangioma within the left side of the L2 vertebral body. No evidence regional osseous metastatic disease. Old minimal loss of height anteriorly at T11. Conus medullaris and cauda equina: Conus extends to the L1 level. Conus and cauda equina appear normal. Paraspinal and other soft tissues: Negative  Disc levels: T12-L1: Minimal disc bulge.  No stenosis. L1-2: Endplate osteophytes and bulging of the disc more prominent towards the right. Facet degeneration more prominent on the right. Stenosis of the right lateral recess and intervertebral foramen on the right that could possibly cause right-sided neural compression. L2-3: Endplate osteophytes and bulging of the disc more prominent towards the right. Probable solid bridging right lateral osteophytes. Facet degeneration and hypertrophy worse on the right. Severe multifactorial stenosis at this level that could cause neural compression on either or both sides. Foraminal stenosis on the right that could focally affect the right L2 nerve. L3-4: Endplate osteophytes and bulging of the disc more prominent on the right. Facet degeneration and hypertrophy. Severe multifactorial spinal stenosis that could cause neural compression on either or both sides. Moderate bilateral foraminal narrowing right worse than left. L4-5: Endplate osteophytes and bulging of the disc with protrusion in the left foraminal region. Facet and ligamentous hypertrophy. Stenosis of the left lateral recess and intervertebral foramen on the left that could cause left-sided neural compression. L5-S1: Disc degeneration with endplate osteophytes, bulging of the disc and a central disc herniation with  slight upward turning behind the inferior endplate of L5. Bilateral facet degeneration and hypertrophy. Stenosis of both subarticular lateral recesses and neural foramina that could cause neural compression on either or both sides. IMPRESSION: 1. No evidence of regional osseous metastatic disease. 2. Scoliotic curvature convex to the left with the apex at L2. 2 mm degenerative anterolisthesis L2-3. 3. L1-2: Right lateral recess and foraminal stenosis that could cause right-sided neural compression 4. L2-3: Endplate osteophytes and bulging of the disc more prominent towards the right. Facet degeneration and hypertrophy worse on the right. Severe multifactorial stenosis at this level that could cause neural compression on either or both sides. Foraminal stenosis on the right that could focally affect the right L2 nerve. 5. L3-4: Endplate osteophytes and bulging of the disc more prominent on the right. Facet degeneration and hypertrophy. Severe multifactorial spinal stenosis that could cause neural compression on either or both sides. Moderate bilateral foraminal narrowing right worse than left. 6. L4-5: Endplate osteophytes and bulging of the disc with protrusion in the left foraminal region. Facet and ligamentous hypertrophy. Stenosis of the left lateral recess and intervertebral foramen on the left that could cause left-sided neural compression. 7. L5-S1: Disc degeneration with endplate osteophytes, bulging of the disc and a central disc herniation with slight upward turning behind the inferior endplate of L5. Bilateral facet degeneration and hypertrophy. Stenosis of both subarticular lateral recesses and neural foramina that could cause neural compression on either or both sides. Electronically Signed   By: Paulina Fusi M.D.   On: 07/10/2023 07:38

## 2023-07-23 NOTE — Assessment & Plan Note (Signed)
 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 Path showed overall Stage IIIB cancer.  -s/p 6 months adjuvant FOLFOX, completed 06/01/20, oxaliplatin held for final 2 cycles due to neuropathy.  -s/p BSO and hysterectomy on 12/22/21 with Dr. Pricilla Holm. Path revealed metastatic moderately differentiated colonic adenocarcinoma involving both ovaries and peritoneum -she restarted FOLFOX on 01/24/22. -Due to disease progression, chemotherapy changed to second line FOLFIRI and bevacizumab on April 06, 2022. She has been tolerating moderately well, better now with dose reduction, will continue -Restaging CT scan from 09/27/2022 showed continous response in liver and peritoneal metastasis, no other new lesions.   -We discussed maintenance therapy option in future  -due to her PE in 10/2022, will hold beva for 3 months  -restaging CT 12/31/2022 showed stable disease and live met is not well defined on CT. She restarted beva on 01/17/23  -restaging CT 06/19/2023 showed overall stable disease (stable small lung nodules, no other evidence of cancer) -She is due for PET scan in May 2025.

## 2023-07-24 ENCOUNTER — Inpatient Hospital Stay (HOSPITAL_BASED_OUTPATIENT_CLINIC_OR_DEPARTMENT_OTHER): Payer: Medicare Other | Admitting: Nurse Practitioner

## 2023-07-24 ENCOUNTER — Encounter: Payer: Self-pay | Admitting: Nurse Practitioner

## 2023-07-24 ENCOUNTER — Inpatient Hospital Stay: Payer: Medicare Other

## 2023-07-24 ENCOUNTER — Encounter: Payer: Self-pay | Admitting: Hematology

## 2023-07-24 VITALS — BP 116/62 | HR 66 | Temp 97.2°F | Resp 17 | Ht 65.5 in | Wt 210.2 lb

## 2023-07-24 DIAGNOSIS — C182 Malignant neoplasm of ascending colon: Secondary | ICD-10-CM

## 2023-07-24 DIAGNOSIS — R202 Paresthesia of skin: Secondary | ICD-10-CM | POA: Diagnosis not present

## 2023-07-24 DIAGNOSIS — Z95828 Presence of other vascular implants and grafts: Secondary | ICD-10-CM

## 2023-07-24 DIAGNOSIS — C7963 Secondary malignant neoplasm of bilateral ovaries: Secondary | ICD-10-CM | POA: Diagnosis not present

## 2023-07-24 DIAGNOSIS — R200 Anesthesia of skin: Secondary | ICD-10-CM

## 2023-07-24 DIAGNOSIS — Z5189 Encounter for other specified aftercare: Secondary | ICD-10-CM | POA: Diagnosis not present

## 2023-07-24 DIAGNOSIS — Z5111 Encounter for antineoplastic chemotherapy: Secondary | ICD-10-CM | POA: Diagnosis not present

## 2023-07-24 DIAGNOSIS — C787 Secondary malignant neoplasm of liver and intrahepatic bile duct: Secondary | ICD-10-CM | POA: Diagnosis not present

## 2023-07-24 DIAGNOSIS — C786 Secondary malignant neoplasm of retroperitoneum and peritoneum: Secondary | ICD-10-CM | POA: Diagnosis not present

## 2023-07-24 LAB — CMP (CANCER CENTER ONLY)
ALT: 14 U/L (ref 0–44)
AST: 20 U/L (ref 15–41)
Albumin: 3.9 g/dL (ref 3.5–5.0)
Alkaline Phosphatase: 140 U/L — ABNORMAL HIGH (ref 38–126)
Anion gap: 6 (ref 5–15)
BUN: 18 mg/dL (ref 8–23)
CO2: 25 mmol/L (ref 22–32)
Calcium: 9.2 mg/dL (ref 8.9–10.3)
Chloride: 111 mmol/L (ref 98–111)
Creatinine: 0.85 mg/dL (ref 0.44–1.00)
GFR, Estimated: >60 mL/min (ref >60)
Glucose, Bld: 121 mg/dL — ABNORMAL HIGH (ref 70–99)
Potassium: 3.9 mmol/L (ref 3.5–5.1)
Sodium: 142 mmol/L (ref 135–145)
Total Bilirubin: 0.7 mg/dL (ref 0.0–1.2)
Total Protein: 6.3 g/dL — ABNORMAL LOW (ref 6.5–8.1)

## 2023-07-24 LAB — CBC WITH DIFFERENTIAL (CANCER CENTER ONLY)
Abs Immature Granulocytes: 0.54 10*3/uL — ABNORMAL HIGH (ref 0.00–0.07)
Basophils Absolute: 0.1 10*3/uL (ref 0.0–0.1)
Basophils Relative: 1 %
Eosinophils Absolute: 0.2 10*3/uL (ref 0.0–0.5)
Eosinophils Relative: 2 %
HCT: 34.1 % — ABNORMAL LOW (ref 36.0–46.0)
Hemoglobin: 10.6 g/dL — ABNORMAL LOW (ref 12.0–15.0)
Immature Granulocytes: 4 %
Lymphocytes Relative: 23 %
Lymphs Abs: 3 10*3/uL (ref 0.7–4.0)
MCH: 31.8 pg (ref 26.0–34.0)
MCHC: 31.1 g/dL (ref 30.0–36.0)
MCV: 102.4 fL — ABNORMAL HIGH (ref 80.0–100.0)
Monocytes Absolute: 1 10*3/uL (ref 0.1–1.0)
Monocytes Relative: 8 %
Neutro Abs: 8.3 10*3/uL — ABNORMAL HIGH (ref 1.7–7.7)
Neutrophils Relative %: 62 %
Platelet Count: 189 10*3/uL (ref 150–400)
RBC: 3.33 MIL/uL — ABNORMAL LOW (ref 3.87–5.11)
RDW: 17.2 % — ABNORMAL HIGH (ref 11.5–15.5)
WBC Count: 13.2 10*3/uL — ABNORMAL HIGH (ref 4.0–10.5)
nRBC: 0 % (ref 0.0–0.2)

## 2023-07-24 LAB — TOTAL PROTEIN, URINE DIPSTICK: Protein, ur: NEGATIVE mg/dL

## 2023-07-24 LAB — CEA (ACCESS): CEA (CHCC): 7.7 ng/mL — ABNORMAL HIGH (ref 0.00–5.00)

## 2023-07-24 MED ORDER — PALONOSETRON HCL INJECTION 0.25 MG/5ML
0.2500 mg | Freq: Once | INTRAVENOUS | Status: AC
  Administered 2023-07-24: 0.25 mg via INTRAVENOUS
  Filled 2023-07-24: qty 5

## 2023-07-24 MED ORDER — SODIUM CHLORIDE 0.9% FLUSH
10.0000 mL | INTRAVENOUS | Status: DC | PRN
  Administered 2023-07-24: 10 mL

## 2023-07-24 MED ORDER — SODIUM CHLORIDE 0.9 % IV SOLN
5.0000 mg/kg | Freq: Once | INTRAVENOUS | Status: AC
  Administered 2023-07-24: 500 mg via INTRAVENOUS
  Filled 2023-07-24: qty 4

## 2023-07-24 MED ORDER — SODIUM CHLORIDE 0.9 % IV SOLN
400.0000 mg/m2 | Freq: Once | INTRAVENOUS | Status: AC
  Administered 2023-07-24: 896 mg via INTRAVENOUS
  Filled 2023-07-24: qty 44.8

## 2023-07-24 MED ORDER — IRINOTECAN HCL CHEMO INJECTION 100 MG/5ML
100.0000 mg/m2 | Freq: Once | INTRAVENOUS | Status: AC
  Administered 2023-07-24: 220 mg via INTRAVENOUS
  Filled 2023-07-24: qty 11

## 2023-07-24 MED ORDER — ATROPINE SULFATE 1 MG/ML IV SOLN
0.5000 mg | Freq: Once | INTRAVENOUS | Status: AC | PRN
  Administered 2023-07-24: 0.5 mg via INTRAVENOUS
  Filled 2023-07-24: qty 1

## 2023-07-24 MED ORDER — PREGABALIN 25 MG PO CAPS: 25.0000 mg | ORAL_CAPSULE | Freq: Every evening | ORAL | 1 refills | Status: DC

## 2023-07-24 MED ORDER — PREGABALIN 50 MG PO CAPS: 50.0000 mg | ORAL_CAPSULE | Freq: Two times a day (BID) | ORAL | 2 refills | Status: DC

## 2023-07-24 MED ORDER — SODIUM CHLORIDE 0.9 % IV SOLN: Freq: Once | INTRAVENOUS | Status: AC

## 2023-07-24 MED ORDER — SODIUM CHLORIDE 0.9 % IV SOLN
2400.0000 mg/m2 | INTRAVENOUS | Status: DC
  Administered 2023-07-24: 5000 mg via INTRAVENOUS
  Filled 2023-07-24: qty 100

## 2023-07-24 MED ORDER — DEXAMETHASONE SODIUM PHOSPHATE 10 MG/ML IJ SOLN
10.0000 mg | Freq: Once | INTRAMUSCULAR | Status: AC
  Administered 2023-07-24: 10 mg via INTRAVENOUS
  Filled 2023-07-24: qty 1

## 2023-07-24 NOTE — Patient Instructions (Signed)
 [facility] - A DEPT OF [facility]  Discharge Instructions: Thank you for choosing [facility] to provide your oncology and hematology care.   If you have a lab appointment with the Cancer Center, please go directly to the Cancer Center and check in at the registration area.   Wear comfortable clothing and clothing appropriate for easy access to any Portacath or PICC line.   We strive to give you quality time with your provider. You may need to reschedule your appointment if you arrive late (15 or more minutes).  Arriving late affects you and other patients whose appointments are after yours.  Also, if you miss three or more appointments without notifying the office, you may be dismissed from the clinic at the provider's discretion.      For prescription refill requests, have your pharmacy contact our office and allow 72 hours for refills to be completed.    Today you received the following chemotherapy and/or immunotherapy agents: MVASI/Irinotecan/Leucovorin/Fluorouracil      To help prevent nausea and vomiting after your treatment, we encourage you to take your nausea medication as directed.  BELOW ARE SYMPTOMS THAT SHOULD BE REPORTED IMMEDIATELY: *FEVER GREATER THAN 100.4 F (38 C) OR HIGHER *CHILLS OR SWEATING *NAUSEA AND VOMITING THAT IS NOT CONTROLLED WITH YOUR NAUSEA MEDICATION *UNUSUAL SHORTNESS OF BREATH *UNUSUAL BRUISING OR BLEEDING *URINARY PROBLEMS (pain or burning when urinating, or frequent urination) *BOWEL PROBLEMS (unusual diarrhea, constipation, pain near the anus) TENDERNESS IN MOUTH AND THROAT WITH OR WITHOUT PRESENCE OF ULCERS (sore throat, sores in mouth, or a toothache) UNUSUAL RASH, SWELLING OR PAIN  UNUSUAL VAGINAL DISCHARGE OR ITCHING   Items with * indicate a potential emergency and should be followed up as soon as possible or go to the Emergency Department if any problems should occur.  Please show the  CHEMOTHERAPY ALERT CARD or IMMUNOTHERAPY ALERT CARD at check-in to the Emergency Department and triage nurse.  Should you have questions after your visit or need to cancel or reschedule your appointment, please contact [facility] - A DEPT OF [facility]  Dept: [phone]  and follow the prompts.  Office hours are 8:00 a.m. to 4:30 p.m. Monday - Friday. Please note that voicemails left after 4:00 p.m. may not be returned until the following business day.  We are closed weekends and major holidays. You have access to a nurse at all times for urgent questions. Please call the main number to the clinic Dept: [phone] and follow the prompts.   For any non-urgent questions, you may also contact your provider using MyChart. We now offer e-Visits for anyone 46 and older to request care online for non-urgent symptoms. For details visit [email or web address].   Also download the MyChart app! Go to the app store, search "MyChart", open the app, select [place], and log in with your MyChart username and password.

## 2023-07-25 ENCOUNTER — Encounter: Payer: Self-pay | Admitting: Nurse Practitioner

## 2023-07-25 DIAGNOSIS — M48062 Spinal stenosis, lumbar region with neurogenic claudication: Secondary | ICD-10-CM | POA: Diagnosis not present

## 2023-07-26 ENCOUNTER — Inpatient Hospital Stay: Payer: Medicare Other

## 2023-07-26 VITALS — BP 122/63 | HR 62 | Temp 98.1°F | Resp 16

## 2023-07-26 DIAGNOSIS — C182 Malignant neoplasm of ascending colon: Secondary | ICD-10-CM | POA: Diagnosis not present

## 2023-07-26 DIAGNOSIS — C786 Secondary malignant neoplasm of retroperitoneum and peritoneum: Secondary | ICD-10-CM | POA: Diagnosis not present

## 2023-07-26 DIAGNOSIS — Z5189 Encounter for other specified aftercare: Secondary | ICD-10-CM | POA: Diagnosis not present

## 2023-07-26 DIAGNOSIS — Z5111 Encounter for antineoplastic chemotherapy: Secondary | ICD-10-CM | POA: Diagnosis not present

## 2023-07-26 DIAGNOSIS — C787 Secondary malignant neoplasm of liver and intrahepatic bile duct: Secondary | ICD-10-CM | POA: Diagnosis not present

## 2023-07-26 DIAGNOSIS — C7963 Secondary malignant neoplasm of bilateral ovaries: Secondary | ICD-10-CM | POA: Diagnosis not present

## 2023-07-26 MED ORDER — HEPARIN SOD (PORK) LOCK FLUSH 100 UNIT/ML IV SOLN
500.0000 [IU] | Freq: Once | INTRAVENOUS | Status: AC | PRN
  Administered 2023-07-26: 500 [IU]

## 2023-07-26 MED ORDER — PEGFILGRASTIM-CBQV 6 MG/0.6ML ~~LOC~~ SOSY
6.0000 mg | PREFILLED_SYRINGE | Freq: Once | SUBCUTANEOUS | Status: AC
  Administered 2023-07-26: 6 mg via SUBCUTANEOUS
  Filled 2023-07-26: qty 0.6

## 2023-07-26 MED ORDER — SODIUM CHLORIDE 0.9% FLUSH
10.0000 mL | INTRAVENOUS | Status: DC | PRN
  Administered 2023-07-26: 10 mL

## 2023-08-07 NOTE — Assessment & Plan Note (Signed)
 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 Path showed overall Stage IIIB cancer.  -s/p 6 months adjuvant FOLFOX, completed 06/01/20, oxaliplatin held for final 2 cycles due to neuropathy.  -s/p BSO and hysterectomy on 12/22/21 with Dr. Pricilla Holm. Path revealed metastatic moderately differentiated colonic adenocarcinoma involving both ovaries and peritoneum -she restarted FOLFOX on 01/24/22. -Due to disease progression, chemotherapy changed to second line FOLFIRI and bevacizumab on April 06, 2022. She has been tolerating moderately well, better now with dose reduction, will continue -Restaging CT scan from 09/27/2022 showed continous response in liver and peritoneal metastasis, no other new lesions.   -We discussed maintenance therapy option in future  -due to her PE in 10/2022, will hold beva for 3 months  -restaging CT 12/31/2022 showed stable disease and live met is not well defined on CT. She restarted beva on 01/17/23  -restaging CT 06/19/2023 showed overall stable disease (stable small lung nodules, no other evidence of cancer)

## 2023-08-08 ENCOUNTER — Inpatient Hospital Stay: Admitting: Hematology

## 2023-08-08 ENCOUNTER — Inpatient Hospital Stay

## 2023-08-08 ENCOUNTER — Encounter: Payer: Self-pay | Admitting: Hematology

## 2023-08-08 ENCOUNTER — Inpatient Hospital Stay: Attending: Hematology

## 2023-08-08 VITALS — BP 124/50 | HR 63 | Temp 98.1°F | Resp 18 | Ht 65.5 in | Wt 210.7 lb

## 2023-08-08 VITALS — BP 145/63 | HR 58 | Temp 97.8°F | Resp 16

## 2023-08-08 DIAGNOSIS — L989 Disorder of the skin and subcutaneous tissue, unspecified: Secondary | ICD-10-CM | POA: Diagnosis not present

## 2023-08-08 DIAGNOSIS — Z5189 Encounter for other specified aftercare: Secondary | ICD-10-CM | POA: Diagnosis not present

## 2023-08-08 DIAGNOSIS — C182 Malignant neoplasm of ascending colon: Secondary | ICD-10-CM | POA: Diagnosis not present

## 2023-08-08 DIAGNOSIS — Z803 Family history of malignant neoplasm of breast: Secondary | ICD-10-CM | POA: Insufficient documentation

## 2023-08-08 DIAGNOSIS — C786 Secondary malignant neoplasm of retroperitoneum and peritoneum: Secondary | ICD-10-CM | POA: Insufficient documentation

## 2023-08-08 DIAGNOSIS — Z8049 Family history of malignant neoplasm of other genital organs: Secondary | ICD-10-CM | POA: Diagnosis not present

## 2023-08-08 DIAGNOSIS — Z800 Family history of malignant neoplasm of digestive organs: Secondary | ICD-10-CM | POA: Diagnosis not present

## 2023-08-08 DIAGNOSIS — Z86000 Personal history of in-situ neoplasm of breast: Secondary | ICD-10-CM | POA: Insufficient documentation

## 2023-08-08 DIAGNOSIS — Z95828 Presence of other vascular implants and grafts: Secondary | ICD-10-CM

## 2023-08-08 DIAGNOSIS — Z923 Personal history of irradiation: Secondary | ICD-10-CM | POA: Diagnosis not present

## 2023-08-08 DIAGNOSIS — C787 Secondary malignant neoplasm of liver and intrahepatic bile duct: Secondary | ICD-10-CM | POA: Insufficient documentation

## 2023-08-08 DIAGNOSIS — D72829 Elevated white blood cell count, unspecified: Secondary | ICD-10-CM | POA: Diagnosis not present

## 2023-08-08 DIAGNOSIS — C7963 Secondary malignant neoplasm of bilateral ovaries: Secondary | ICD-10-CM | POA: Insufficient documentation

## 2023-08-08 DIAGNOSIS — Z5111 Encounter for antineoplastic chemotherapy: Secondary | ICD-10-CM | POA: Insufficient documentation

## 2023-08-08 LAB — CBC WITH DIFFERENTIAL (CANCER CENTER ONLY)
Abs Immature Granulocytes: 1.3 10*3/uL — ABNORMAL HIGH (ref 0.00–0.07)
Basophils Absolute: 0.1 10*3/uL (ref 0.0–0.1)
Basophils Relative: 1 %
Eosinophils Absolute: 0.2 10*3/uL (ref 0.0–0.5)
Eosinophils Relative: 2 %
HCT: 33.8 % — ABNORMAL LOW (ref 36.0–46.0)
Hemoglobin: 10.8 g/dL — ABNORMAL LOW (ref 12.0–15.0)
Immature Granulocytes: 9 %
Lymphocytes Relative: 27 %
Lymphs Abs: 4 10*3/uL (ref 0.7–4.0)
MCH: 32 pg (ref 26.0–34.0)
MCHC: 32 g/dL (ref 30.0–36.0)
MCV: 100 fL (ref 80.0–100.0)
Monocytes Absolute: 0.8 10*3/uL (ref 0.1–1.0)
Monocytes Relative: 6 %
Neutro Abs: 8.5 10*3/uL — ABNORMAL HIGH (ref 1.7–7.7)
Neutrophils Relative %: 55 %
Platelet Count: 192 10*3/uL (ref 150–400)
RBC: 3.38 MIL/uL — ABNORMAL LOW (ref 3.87–5.11)
RDW: 17.6 % — ABNORMAL HIGH (ref 11.5–15.5)
WBC Count: 15 10*3/uL — ABNORMAL HIGH (ref 4.0–10.5)
nRBC: 0 % (ref 0.0–0.2)

## 2023-08-08 LAB — CMP (CANCER CENTER ONLY)
ALT: 22 U/L (ref 0–44)
AST: 28 U/L (ref 15–41)
Albumin: 3.9 g/dL (ref 3.5–5.0)
Alkaline Phosphatase: 165 U/L — ABNORMAL HIGH (ref 38–126)
Anion gap: 7 (ref 5–15)
BUN: 21 mg/dL (ref 8–23)
CO2: 24 mmol/L (ref 22–32)
Calcium: 9 mg/dL (ref 8.9–10.3)
Chloride: 111 mmol/L (ref 98–111)
Creatinine: 0.83 mg/dL (ref 0.44–1.00)
GFR, Estimated: >60 mL/min (ref >60)
Glucose, Bld: 107 mg/dL — ABNORMAL HIGH (ref 70–99)
Potassium: 3.9 mmol/L (ref 3.5–5.1)
Sodium: 142 mmol/L (ref 135–145)
Total Bilirubin: 0.6 mg/dL (ref 0.0–1.2)
Total Protein: 6.5 g/dL (ref 6.5–8.1)

## 2023-08-08 LAB — TOTAL PROTEIN, URINE DIPSTICK: Protein, ur: NEGATIVE mg/dL

## 2023-08-08 LAB — CEA (ACCESS): CEA (CHCC): 9.39 ng/mL — ABNORMAL HIGH (ref 0.00–5.00)

## 2023-08-08 MED ORDER — SODIUM CHLORIDE 0.9 % IV SOLN
2400.0000 mg/m2 | INTRAVENOUS | Status: DC
  Administered 2023-08-08: 5000 mg via INTRAVENOUS
  Filled 2023-08-08: qty 100

## 2023-08-08 MED ORDER — SODIUM CHLORIDE 0.9 % IV SOLN
100.0000 mg/m2 | Freq: Once | INTRAVENOUS | Status: AC
  Administered 2023-08-08: 220 mg via INTRAVENOUS
  Filled 2023-08-08: qty 11

## 2023-08-08 MED ORDER — SODIUM CHLORIDE 0.9% FLUSH: 10.0000 mL | INTRAVENOUS | Status: DC | PRN

## 2023-08-08 MED ORDER — ATROPINE SULFATE 1 MG/ML IV SOLN
0.5000 mg | Freq: Once | INTRAVENOUS | Status: AC | PRN
  Administered 2023-08-08: 0.5 mg via INTRAVENOUS
  Filled 2023-08-08: qty 1

## 2023-08-08 MED ORDER — SODIUM CHLORIDE 0.9% FLUSH
10.0000 mL | INTRAVENOUS | Status: DC | PRN
  Administered 2023-08-08: 10 mL

## 2023-08-08 MED ORDER — POTASSIUM CHLORIDE ER 10 MEQ PO TBCR
10.0000 meq | EXTENDED_RELEASE_TABLET | Freq: Two times a day (BID) | ORAL | 2 refills | Status: DC
Start: 2023-08-08 — End: 2023-11-07

## 2023-08-08 MED ORDER — HEPARIN SOD (PORK) LOCK FLUSH 100 UNIT/ML IV SOLN: 500.0000 [IU] | Freq: Once | INTRAVENOUS | Status: DC | PRN

## 2023-08-08 MED ORDER — SODIUM CHLORIDE 0.9 % IV SOLN
400.0000 mg/m2 | Freq: Once | INTRAVENOUS | Status: AC
  Administered 2023-08-08: 896 mg via INTRAVENOUS
  Filled 2023-08-08: qty 44.8

## 2023-08-08 MED ORDER — SODIUM CHLORIDE 0.9 % IV SOLN
5.0000 mg/kg | Freq: Once | INTRAVENOUS | Status: AC
  Administered 2023-08-08: 500 mg via INTRAVENOUS
  Filled 2023-08-08: qty 4

## 2023-08-08 MED ORDER — SODIUM CHLORIDE 0.9 % IV SOLN: Freq: Once | INTRAVENOUS | Status: AC

## 2023-08-08 MED ORDER — DEXAMETHASONE SODIUM PHOSPHATE 10 MG/ML IJ SOLN
10.0000 mg | Freq: Once | INTRAMUSCULAR | Status: AC
  Administered 2023-08-08: 10 mg via INTRAVENOUS
  Filled 2023-08-08: qty 1

## 2023-08-08 MED ORDER — PALONOSETRON HCL INJECTION 0.25 MG/5ML
0.2500 mg | Freq: Once | INTRAVENOUS | Status: AC
  Administered 2023-08-08: 0.25 mg via INTRAVENOUS
  Filled 2023-08-08: qty 5

## 2023-08-08 NOTE — Patient Instructions (Signed)
 [facility] - A DEPT OF [facility]  Discharge Instructions: Thank you for choosing [facility] to provide your oncology and hematology care.   If you have a lab appointment with the Cancer Center, please go directly to the Cancer Center and check in at the registration area.   Wear comfortable clothing and clothing appropriate for easy access to any Portacath or PICC line.   We strive to give you quality time with your provider. You may need to reschedule your appointment if you arrive late (15 or more minutes).  Arriving late affects you and other patients whose appointments are after yours.  Also, if you miss three or more appointments without notifying the office, you may be dismissed from the clinic at the provider's discretion.      For prescription refill requests, have your pharmacy contact our office and allow 72 hours for refills to be completed.    Today you received the following chemotherapy and/or immunotherapy agents: Bevacizumab (Mvasi), Irinotecan, Leucovorin, and Fluorouracil.      To help prevent nausea and vomiting after your treatment, we encourage you to take your nausea medication as directed.  BELOW ARE SYMPTOMS THAT SHOULD BE REPORTED IMMEDIATELY: *FEVER GREATER THAN 100.4 F (38 C) OR HIGHER *CHILLS OR SWEATING *NAUSEA AND VOMITING THAT IS NOT CONTROLLED WITH YOUR NAUSEA MEDICATION *UNUSUAL SHORTNESS OF BREATH *UNUSUAL BRUISING OR BLEEDING *URINARY PROBLEMS (pain or burning when urinating, or frequent urination) *BOWEL PROBLEMS (unusual diarrhea, constipation, pain near the anus) TENDERNESS IN MOUTH AND THROAT WITH OR WITHOUT PRESENCE OF ULCERS (sore throat, sores in mouth, or a toothache) UNUSUAL RASH, SWELLING OR PAIN  UNUSUAL VAGINAL DISCHARGE OR ITCHING   Items with * indicate a potential emergency and should be followed up as soon as possible or go to the Emergency Department if any problems should  occur.  Please show the CHEMOTHERAPY ALERT CARD or IMMUNOTHERAPY ALERT CARD at check-in to the Emergency Department and triage nurse.  Should you have questions after your visit or need to cancel or reschedule your appointment, please contact [facility] - A DEPT OF [facility]  Dept: [phone]  and follow the prompts.  Office hours are 8:00 a.m. to 4:30 p.m. Monday - Friday. Please note that voicemails left after 4:00 p.m. may not be returned until the following business day.  We are closed weekends and major holidays. You have access to a nurse at all times for urgent questions. Please call the main number to the clinic Dept: [phone] and follow the prompts.   For any non-urgent questions, you may also contact your provider using MyChart. We now offer e-Visits for anyone 35 and older to request care online for non-urgent symptoms. For details visit [email or web address].   Also download the MyChart app! Go to the app store, search "MyChart", open the app, select [place], and log in with your MyChart username and password.  The chemotherapy medication bag should finish at 46 hours, 96 hours, or 7 days. For example, if your pump is scheduled for 46 hours and it was put on at 4:00 p.m., it should finish at 2:00 p.m. the day it is scheduled to come off regardless of your appointment time.     Estimated time to finish at:    If the display on your pump reads "Low Volume" and it is beeping, take the batteries out of the pump and come to the  cancer center for it to be taken off.   If the pump alarms go off prior to the pump reading "Low Volume" then call [phone] and someone can assist you.  If the plunger comes out and the chemotherapy medication is leaking out, please use your home chemo spill kit to clean up the spill. Do NOT use paper towels or other household products.  If you have problems or questions regarding your pump, please call either  [phone] (24 hours a day) or the cancer center Monday-Friday 8:00 a.m.- 4:30 p.m. at the clinic number and we will assist you. If you are unable to get assistance, then go to the nearest Emergency Department and ask the staff to contact the IV team for assistance.

## 2023-08-08 NOTE — Progress Notes (Signed)
 [facility]   [phone] [phone]   Clinic Follow up Note   Patient Care Team: [name], MD as PCP - General (Family Medicine) [name], MD as Consulting Physician (General Surgery) [name], MD as Consulting Physician (Hematology) [name], MD as Consulting Physician (General Surgery) [name], MD as Consulting Physician (Hematology and Oncology) [facility], [facility]  Date of Service:  08/08/2023  CHIEF COMPLAINT: f/u of metastatic colon cancer  CURRENT THERAPY:  FOLFIRI and bevacizumab  Oncology History   Malignant neoplasm of ascending colon (HCC) pT3N2aM0 stage IIB, MSS, metastatic to b/l ovaries and peritoneum and liver, KRAS mutation G12V (+)  -Initially diagnosed in 08/2019, s/p resection with Dr Maisie Fus on 11/01/19. Path showed overall Stage IIIB cancer.  -s/p 6 months adjuvant FOLFOX, completed 06/01/20, oxaliplatin held for final 2 cycles due to neuropathy.  -s/p BSO and hysterectomy on 12/22/21 with Dr. Pricilla Holm. Path revealed metastatic moderately differentiated colonic adenocarcinoma involving both ovaries and peritoneum -she restarted FOLFOX on 01/24/22. -Due to disease progression, chemotherapy changed to second line FOLFIRI and bevacizumab on April 06, 2022. She has been tolerating moderately well, better now with dose reduction, will continue -Restaging CT scan from 09/27/2022 showed continous response in liver and peritoneal metastasis, no other new lesions.   -We discussed maintenance therapy option in future  -due to her PE in 10/2022, will hold beva for 3 months  -restaging CT 12/31/2022 showed stable disease and live met is not well defined on CT. She restarted beva on 01/17/23  -restaging CT 06/19/2023 showed overall stable disease (stable small lung nodules, no other evidence of cancer)   Assessment & Plan Metastatic colon cancer She has metastatic colon cancer with peritoneal and  hepatic metastases. The lesions are small and challenging to detect on imaging. She has been on FOLFIRINOX chemotherapy since December 2023 after progression on a prior regimen. The February CT scan showed minimal residual disease, and she is scheduled for another scan in mid-May. She reports decreased energy levels, expected with prolonged chemotherapy. Transitioning to maintenance therapy was considered, but the decision was made to continue the current regimen pending further imaging and circulating tumor DNA results. Circulating tumor DNA testing was discussed as a method to detect microscopic disease and will be considered if the upcoming CT scan is favorable. - Continue FOLFIRINOX chemotherapy until May 5th - Order CT scan in mid-May - Perform circulating tumor DNA test if CT scan results are favorable  Skin lesion She reports a skin lesion behind the ear, initially a flat spot, which has grown over the past three to four weeks. The lesion bled slightly upon manipulation but is not painful. It is not suspected to be malignant and may be related to chemotherapy-induced skin changes.  Elevated white blood cell count She has a slightly elevated white blood cell count, possibly due to seasonal allergic rhinitis or allergy immunotherapy. She denies any pain associated with the shots.  Medication management She requires a refill of potassium, taken twice daily. She has sufficient supplies of Eliquis and Lyrica for at least a week. - Refill potassium prescription at [facility] pharmacy on [facility] -Lab reviewed, CMP still pending, if adequate, will proceed same dose treatment. - Schedule chemotherapy on April 21st and May 5th - Schedule follow-up appointment in two weeks -I refilled KCL      SUMMARY OF ONCOLOGIC HISTORY: Oncology History Overview Note   Cancer Staging  Malignant neoplasm of female  breast [facility]) Staging form: Breast, AJCC 7th Edition - Clinical stage from 06/11/2014:  Stage 0 (Tis (DCIS), N0, M0) - Unsigned Staged by: Pathologist and managing physician Laterality: Left Estrogen receptor status: Positive Progesterone receptor status: Positive Stage used in treatment planning: Yes National guidelines used in treatment planning: Yes Type of national guideline used in treatment planning: NCCN - Pathologic stage from 07/03/2014: Stage Unknown (Tis (DCIS), NX, cM0) - Signed by [name], MD on 07/10/2014 Staged by: Pathologist Laterality: Left Estrogen receptor status: Positive Progesterone receptor status: Positive Stage used in treatment planning: Yes National guidelines used in treatment planning: Yes Type of national guideline used in treatment planning: NCCN Staging comments: Staged on final lumpectomy specimen by Dr. [name].  Right colon cancer Staging form: Colon and Rectum, AJCC 8th Edition - Pathologic stage from 11/01/2019: Stage IIIB (pT3, pN2a, cM0) - Signed by [name], MD on 11/29/2019 Stage prefix: Initial diagnosis Histologic grading system: 4 grade system Histologic grade (G): G2 Residual tumor (R): R0 - None Tumor deposits (TD): Absent Perineural invasion (PNI): Absent Microsatellite instability (MSI): Stable KRAS mutation: Unknown NRAS mutation: Unknown BRAF mutation: Unknown     Malignant neoplasm of female breast (HCC)  05/29/2014 Initial Biopsy   Left breast needle core biopsy: Grade 2, DCIS with calcs. ER+ (100%), PR+ (96%).    06/04/2014 Initial Diagnosis   Left breast DCIS with calcifications, ER 100%, PR 96%   06/10/2014 Breast MRI   Left breast: 2.4 x 1.3 x 1.1 cm area of patchy non-mass enhancement upper outer quadrant includes postbiopsy seroma; Right breast: 1.2 cm previously biopsied stable benign fibroadenoma   06/12/2014 Procedure   Genetic counseling/testing: Identified 1 VUS on CHEK2 gene. Remainder of 17 gene panel tested negative and included: ATM, BARD1, BRCA 1/2, BRIP1, CDH1, CHEK2, EPCAM, MLH1, MSH2, MSH6, NBN,  NF1, PALB2, PTEN, RAD50, RAD51C, RAD51D, STK11, and TP53.    07/01/2014 Surgery   Left breast lumpectomy ([facility]): Grade 1, DCIS, spanning 2.3 cm, 1 mm margin, ER 100%, PR 96%   07/31/2014 - 08/28/2014 Radiation Therapy   Adjuvant RT completed [name]). Left breast: Total dose 42.5 Gy over 17 fractions. Left breast boost: Total dose 7.5 Gy over 3 fractions.    09/14/2014 - 01/13/2020 Anti-estrogen oral therapy   Anastrazole 1mg  daily. Planned duration of treatment: 5 years [place]). Completed in 01/2020.    09/25/2014 Survivorship   Survivorship Care Plan given to patient and reviewed with her in person.    03/02/2021 Imaging   CT CAP  IMPRESSION: 1. No findings of active/recurrent malignancy. Partial right hemicolectomy. 2. Endometrial stripe remains mildly thickened, but endometrial biopsy in May was negative for malignancy. 3. Progressive endplate sclerosis and endplate irregularity at T2-3, probably due to degenerative endplate findings. If the has referable upper thoracic pain/symptoms then thoracic spine MRI could be used for further workup. 4. Other imaging findings of potential clinical significance: Mild cardiomegaly. Aortic Atherosclerosis (ICD10-I70.0). Mild mitral valve calcification. Postoperative findings in the left breast with adjacent radiation port anteriorly in the left upper lobe. Tiny pulmonary nodules in the left lower lobe are unchanged from earliest available comparison of 10/17/2019 and probably benign although may merit surveillance. Multilevel lumbar impingement. Mild pelvic floor laxity.   Malignant neoplasm of ascending colon (HCC)  09/19/2019 Imaging   CT AP W contrast 09/19/19  IMPRESSION Fullness in the cecum, cannot exclude a mass. No evidence for metastatic disease is identified.    09/24/2019 Procedure   Colonoscopy by Dr [name] 09/24/19 IMPRESSION 1.  The colon was redundant  2. Mild diverticulosis was noted through the entire examined colon 3.  Single 12mm polyp was found in the ascending colon; polypectomy was performed using snare cautery and biopsy forceps 4. Mild diverticulosis was notes in the descending colon and sigmoid colon.  5. Single polyp was found in the sigmoid colon, polypectomy was performed with cold forceps.  6. Single polyp was found in the rectosigmoid colon; polypectomy was performed with cold snare  7. Small internal hemorrhoids  8. Large mass was found at the cecum; multiple biopsies of the area were performed using cold forceps; injection (tattooing) was performed distal to the mass.    09/24/2019 Initial Biopsy   INTERPRETATION AND DIAGNOSIS:  A. Cecum, biopsy:  Invasive moderately differentiated adenocarcinoma.  see comment  B. Polyp @ ascending colon, polypectomy:  Tubular Adenoma  C. Polyp @ sigmoid colon Polypectomy:  hyperplastic polyp.  D. Polyp @ rectosigmoid colon, Polypectomy:  Hyperplastic Polyp      10/16/2019 Imaging   CT Chest IMPRESSION: 1. Multiple small pulmonary nodules measuring 5 mm or less in size in the lungs. These are nonspecific and are typically considered statistically likely benign. However, given the patient's history of primary malignancy, close attention on follow-up studies is recommended to ensure stability. 2. Aortic atherosclerosis, in addition to right coronary artery disease. Assessment for potential risk factor modification, dietary therapy or pharmacologic therapy may be warranted, if clinically indicated. 3. There are calcifications of the aortic valve and mitral annulus. Echocardiographic correlation for evaluation of potential valvular dysfunction may be warranted if clinically indicated. 4. Small hiatal hernia.   Aortic Atherosclerosis (ICD10-I70.0).   11/01/2019 Initial Diagnosis   Colon cancer (HCC)   11/01/2019 Surgery   LAPAROSCOPIC PARTIAL COLECTOMY by Dr Maisie Fus and Dr Michaell Cowing   11/01/2019 Pathology Results   FINAL MICROSCOPIC DIAGNOSIS:   A.  COLON, PROXIMAL RIGHT, COLECTOMY:  - Invasive colonic adenocarcinoma, 5 cm.  - Tumor invades through the muscularis propria into pericolonic tissues.   - Margins of resection are not involved.  - Metastatic carcinoma in (5) of (13) lymph nodes.  - See oncology table.    MSI Stable  Mismatch repair normal  MLH1 - Preserved nuclear expression (greater 50% tumor expression) MSH2 - Preserved nuclear expression (greater 50% tumor expression) MSH6 - Preserved nuclear expression (greater 50% tumor expression) PMS2 - Preserved nuclear expression (greater 50% tumor expression)   11/01/2019 Cancer Staging   Staging form: Colon and Rectum, AJCC 8th Edition - Pathologic stage from 11/01/2019: Stage IIIB (pT3, pN2a, cM0) - Signed by [name], MD on 11/29/2019   12/10/2019 Procedure   PAC placed 12/10/19   12/17/2019 - 06/01/2020 Chemotherapy   FOLFOX q2weeks starting in 2 weeks starting 12/17/19. Held 01/27/20-02/10/20 due to b/l PE. Oxaliplatin held C11-12 due to neuropathy. Completed on 06/01/20   03/02/2021 Imaging   CT CAP  IMPRESSION: 1. No findings of active/recurrent malignancy. Partial right hemicolectomy. 2. Endometrial stripe remains mildly thickened, but endometrial biopsy in May was negative for malignancy. 3. Progressive endplate sclerosis and endplate irregularity at T2-3, probably due to degenerative endplate findings. If the has referable upper thoracic pain/symptoms then thoracic spine MRI could be used for further workup. 4. Other imaging findings of potential clinical significance: Mild cardiomegaly. Aortic Atherosclerosis (ICD10-I70.0). Mild mitral valve calcification. Postoperative findings in the left breast with adjacent radiation port anteriorly in the left upper lobe. Tiny pulmonary nodules in the left lower lobe are unchanged from earliest available comparison of 10/17/2019 and  probably benign although may merit surveillance. Multilevel lumbar impingement. Mild pelvic floor  laxity.   08/26/2021 Imaging   EXAM: CT CHEST, ABDOMEN, AND PELVIS WITH CONTRAST  IMPRESSION: 1. Stable examination without new or progressive findings to suggest local recurrence or metastatic disease within the chest, abdomen, or pelvis. 2. Hepatomegaly with hepatic steatosis. 3. Sigmoid colonic diverticulosis without findings of acute diverticulitis. 4. Similar prominent endplate sclerosis and irregularity at T2-T3 is most consistent with Modic type endplate degenerative changes. However, if patient has referable upper thoracic pain consider further workup with thoracic spine MRI. 5. Similar thickening of the endometrial stripe measuring up to 8 mm, which was previously biopsied with results negative for malignancy. 6. Aortic Atherosclerosis (ICD10-I70.0).   11/10/2021 PET scan   IMPRESSION: 1. LEFT ovary is increased in size and is intensely hypermetabolic. While physiologic hypermetabolic ovarian tissue is not uncommon, the enlargement and asymmetric activity warrants further evaluation. Consider contrast pelvic MRI vs tissue sampling. 2. No evidence of metastatic colorectal carcinoma otherwise. 3. Post RIGHT hemicolectomy anatomy. 4. Evidence of radiation change in the LEFT upper lobe (remote breast cancer).     12/22/2021 Relapse/Recurrence    FINAL MICROSCOPIC DIAGNOSIS:   A. LEFT OVARY AND FALLOPIAN TUBE, SALPINGO OOPHORECTOMY:  - Metastatic moderately differentiated colonic adenocarcinoma involving left ovary  - Focal ovarian stromal calcification  - Segment of benign fallopian tube   B. UTERUS WITH RIGHT FALLOPIAN TUBE AND OVARY, HYSTERECTOMY AND SALPINGO-OOPHORECTOMY:  - Metastatic moderately differentiated colonic adenocarcinoma involving right ovary  - Focal invasive extensive adenomyosis  - Benign endometrial polyps  - Benign proliferative phase endometrium  - Hydrosalpinx of right fallopian tube  - Focal ovarian stromal calcification   C. PERITONEAL DEPOSITS,  ANTERIOR CUL DE SAC, BIOPSY:  - Metastatic mucinous adenocarcinoma, consistent with colorectal primary    COMMENT:  Immunohistochemical stains show that the tumor cells are positive for CK20 and CDX2 while they are negative for CK7 and PAX8, consistent with above interpretation.    01/24/2022 - 03/23/2022 Chemotherapy   Patient is on Treatment Plan : COLORECTAL FOLFOX q14d x 3 months     04/01/2022 Imaging   CT AP IMPRESSION: 1. New omental metastatic disease. 2. Vague hypoattenuating lesion in the inferior right hepatic lobe is new and also worrisome for metastatic disease. 3. Similar small right lower lobe nodules. Recommend continued attention on follow-up. 4. Steatotic enlarged liver. 5.  Aortic atherosclerosis (ICD10-I70.0).   04/06/2022 -  Chemotherapy   Patient is on Treatment Plan : COLORECTAL FOLFIRI + Bevacizumab q14d     04/21/2022 Imaging    IMPRESSION: 1. Stable chest CT. No evidence of metastatic disease. 2. Stable small pulmonary nodules, considered benign based on stability. 3. Stable postsurgical changes in the left breast and anterior left upper lobe radiation changes. 4. Aortic Atherosclerosis (ICD10-I70.0) and Emphysema (ICD10-J43.9).   04/21/2022 Imaging    IMPRESSION: 1. Small enhancing lesion inferiorly in the right hepatic lobe is typical of metastatic disease and stable from recent abdominal CT. No other definite liver lesions are identified. Mild hepatic steatosis. 2. No other significant abdominal findings. 3. Omental nodularity seen on CT is not well visualized by MRI.    Imaging     06/23/2022 Imaging    IMPRESSION: 1. Hypodense lesion of the inferior right lobe of the liver, hepatic segment VI is diminished in size, consistent with treatment response. 2. Tiny peritoneal and omental nodules identified by prior examination are diminished in size, consistent with treatment response. 3. Multiple tiny pulmonary  nodules unchanged, most likely  benign and incidental, however continued attention on follow-up warranted in the setting of known metastatic disease. 4. No evidence of new metastatic disease in the chest, abdomen, or pelvis. 5. Status post partial right hemicolectomy and reanastomosis. 6. Diffuse mosaic attenuation of the airspaces, consistent with small airways disease. 7. Hepatomegaly.      Discussed the use of AI scribe software for clinical note transcription with the patient, who gave verbal consent to proceed.  History of Present Illness The patient, a 78 year old female with metastatic colon cancer, presents for a routine follow-up. She reports a new skin lesion behind her ear that has been present for approximately three to four weeks. Initially, it was a small, flat spot, but it has since grown larger. The patient admits to picking at the lesion, causing it to bleed. She denies any discomfort from the lesion.  The patient has been on chemotherapy for over a year, which has resulted in hair loss. She reports no problems from the last few cycles of chemotherapy. However, she notes a decrease in energy level, stating that she is slower in completing tasks but is still able to do yard work.  The patient also mentions needing a refill of her potassium medication, which she takes twice a day. She reports having enough of her other medications, Eliquis and Lyrica, for at least another week.     All other systems were reviewed with the patient and are negative.  MEDICAL HISTORY:  Past Medical History:  Diagnosis Date   Aortic atherosclerosis (HCC)    Arthritis    feet, lower back   Basal cell carcinoma    arm   Breast cancer of upper-outer quadrant of left female breast (HCC) 06/04/2014   Cataract    immature on the left   Colon cancer (HCC) 08/2019   Diabetes mellitus without complication (HCC)    Diverticulosis    Dizziness    > 43yrs ago;took Antivert    Family history of anesthesia complication     sister slow to wake up with anesthesia   Family history of breast cancer    Family history of colon cancer    Family history of uterine cancer    GERD (gastroesophageal reflux disease)    takes occasional TUMs   History of bronchitis    > 25yrs ago   History of colon polyps    History of hiatal hernia    Small noted on CT   History of pulmonary embolus (PE)    Hypertension    takes Losartan daily and HCTZ   Iron deficiency anemia    Joint pain    Metastatic disease (HCC) 2023   peritoneum and liver mets   Numbness    to toes on each foot   Peripheral neuropathy    feet and toes   Personal history of radiation therapy    Pulmonary nodules    Noted on CT   Radiation 07/31/14-08/28/14   Left Breast 20 fxs   Seasonal allergies    takes Claritin prn   Urinary frequency    Vitamin D deficiency    takes VIt D daily    SURGICAL HISTORY: Past Surgical History:  Procedure Laterality Date   BREAST BIOPSY Bilateral    BREAST LUMPECTOMY Left    BREAST LUMPECTOMY WITH RADIOACTIVE SEED LOCALIZATION Left 07/01/2014   Procedure: LEFT BREAST LUMPECTOMY WITH RADIOACTIVE SEED LOCALIZATION;  Surgeon: Glenna Fellows, MD;  Location: [facility];  Service: General;  Laterality: Left;   CATARACT EXTRACTION Right    COLONOSCOPY  09/24/2019   [place]   LAPAROSCOPIC PARTIAL COLECTOMY N/A 11/01/2019   Procedure: LAPAROSCOPIC PARTIAL COLECTOMY;  Surgeon: [name], MD;  Location: [facility];  Service: General;  Laterality: N/A;   POLYPECTOMY     PORTACATH PLACEMENT N/A 12/10/2019   Procedure: INSERTION PORT-A-CATH ULTRASOUND GUIDED IN RIGHT IJ;  Surgeon: [name], MD;  Location: [facility];  Service: General;  Laterality: N/A;   ROBOTIC ASSISTED TOTAL HYSTERECTOMY Bilateral 12/22/2021   Procedure: XI ROBOTIC ASSISTED TOTAL HYSTERECTOMY WITH BILATERAL SALPINGO OOPHORECTOMY;  Surgeon: Carver Fila, MD;  Location: [facility];  Service: Gynecology;  Laterality: Bilateral;   TOTAL  KNEE ARTHROPLASTY Left 10/24/2012   Procedure: TOTAL KNEE ARTHROPLASTY;  Surgeon: Nestor Lewandowsky, MD;  Location: [facility];  Service: Orthopedics;  Laterality: Left;   TOTAL KNEE ARTHROPLASTY Right 01/09/2013   Procedure: TOTAL KNEE ARTHROPLASTY;  Surgeon: Nestor Lewandowsky, MD;  Location: [facility];  Service: Orthopedics;  Laterality: Right;   TUBAL LIGATION      I have reviewed the social history and family history with the patient and they are unchanged from previous note.  ALLERGIES:  is allergic to oxycodone.  MEDICATIONS:  Current Outpatient Medications  Medication Sig Dispense Refill   apixaban (ELIQUIS) 5 MG TABS tablet Take 1 tablet (5 mg total) by mouth 2 (two) times daily. 60 tablet 5   b complex vitamins capsule Take 1 capsule by mouth daily.     Cholecalciferol (VITAMIN D) 2000 UNITS CAPS Take 2,000 Units by mouth daily.      ibuprofen (ADVIL) 200 MG tablet Take 200 mg by mouth daily as needed (pain).     lidocaine-prilocaine (EMLA) cream Apply 1 Application topically as needed. 30 g 1   loratadine (CLARITIN) 10 MG tablet Take 10 mg by mouth daily as needed for allergies.     losartan (COZAAR) 25 MG tablet Take 1 tablet (25 mg total) by mouth daily. 90 tablet 3   metFORMIN (GLUCOPHAGE) 500 MG tablet Take 1 tablet (500 mg total) by mouth in the morning and at bedtime. 180 tablet 3   Multiple Vitamins-Minerals (MULTIVITAMIN WITH MINERALS) tablet Take 1 tablet by mouth daily.     omeprazole (PRILOSEC) 20 MG capsule Take 20 mg by mouth daily before breakfast.      ondansetron (ZOFRAN) 8 MG tablet Take 1 tablet (8 mg total) by mouth every 8 (eight) hours as needed for nausea or vomiting. Start on the third day after chemotherapy. 30 tablet 1   potassium chloride (KLOR-CON) 10 MEQ tablet Take 1 tablet (10 mEq total) by mouth 2 (two) times daily. 60 tablet 2   pravastatin (PRAVACHOL) 10 MG tablet Take 1 tablet (10 mg total) by mouth daily. 90 tablet 3   pregabalin (LYRICA) 25 MG capsule Take 1  capsule (25 mg total) by mouth at bedtime. In addition to 50mg  at bedtime (for total of 75 mg at bedtime) 30 capsule 1   pregabalin (LYRICA) 50 MG capsule Take 1 capsule (50 mg total) by mouth 2 (two) times daily. Take additional 25 mg at bedtime (for total of 75,mg at bedtime). 60 capsule 2   prochlorperazine (COMPAZINE) 10 MG tablet Take 1 tablet (10 mg total) by mouth every 6 (six) hours as needed for nausea or vomiting. 30 tablet 1   traMADol (ULTRAM) 50 MG tablet Take 1 tablet (50 mg total) by mouth every 6 (six) hours as needed. 30 tablet 0  triamcinolone ointment (KENALOG) 0.5 % Apply 1 Application topically 2 (two) times daily. 30 g 0   No current facility-administered medications for this visit.    PHYSICAL EXAMINATION: ECOG PERFORMANCE STATUS: 1 - Symptomatic but completely ambulatory  Vitals:   08/08/23 0851  BP: (!) 124/50  Pulse: 63  Resp: 18  Temp: 98.1 F (36.7 C)  SpO2: 99%   Wt Readings from Last 3 Encounters:  08/08/23 210 lb 11.2 oz (95.6 kg)  07/24/23 210 lb 3.2 oz (95.3 kg)  07/10/23 210 lb 6.4 oz (95.4 kg)     GENERAL:alert, no distress and comfortable SKIN: skin color, texture, turgor are normal, no rashes or significant lesions EYES: normal, Conjunctiva are pink and non-injected, sclera clear NECK: supple, thyroid normal size, non-tender, without nodularity LYMPH:  no palpable lymphadenopathy in the cervical, axillary  LUNGS: clear to auscultation and percussion with normal breathing effort HEART: regular rate & rhythm and no murmurs and no lower extremity edema ABDOMEN:abdomen soft, non-tender and normal bowel sounds Musculoskeletal:no cyanosis of digits and no clubbing  NEURO: alert & oriented x 3 with fluent speech, no focal motor/sensory deficits  Physical Exam    LABORATORY DATA:  I have reviewed the data as listed    Latest Ref Rng & Units 08/08/2023    8:30 AM 07/24/2023    8:26 AM 07/10/2023    9:17 AM  CBC  WBC 4.0 - 10.5 K/uL 15.0  13.2   12.3   Hemoglobin 12.0 - 15.0 g/dL 16.1  09.6  04.5   Hematocrit 36.0 - 46.0 % 33.8  34.1  35.0   Platelets 150 - 400 K/uL 192  189  197         Latest Ref Rng & Units 07/24/2023    8:26 AM 07/10/2023    9:17 AM 06/26/2023    8:16 AM  CMP  Glucose 70 - 99 mg/dL 409  811  914   BUN 8 - 23 mg/dL 18  15  16    Creatinine 0.44 - 1.00 mg/dL 7.82  9.56  2.13   Sodium 135 - 145 mmol/L 142  143  145   Potassium 3.5 - 5.1 mmol/L 3.9  3.9  3.8   Chloride 98 - 111 mmol/L 111  110  113   CO2 22 - 32 mmol/L 25  27  26    Calcium 8.9 - 10.3 mg/dL 9.2  9.0  9.0   Total Protein 6.5 - 8.1 g/dL 6.3  6.4  6.2   Total Bilirubin 0.0 - 1.2 mg/dL 0.7  0.6  0.7   Alkaline Phos 38 - 126 U/L 140  171  140   AST 15 - 41 U/L 20  32  18   ALT 0 - 44 U/L 14  21  15        RADIOGRAPHIC STUDIES: I have personally reviewed the radiological images as listed and agreed with the findings in the report. No results found.    Orders Placed This Encounter  Procedures   CBC with Differential ([facility] Only)    Standing Status:   Future    Expected Date:   09/18/2023    Expiration Date:   09/17/2024   CMP ([facility] Center only)    Standing Status:   Future    Expected Date:   09/18/2023    Expiration Date:   09/17/2024   CBC with Differential ([facility] Center Only)    Standing Status:   Future    Expected Date:  10/02/2023    Expiration Date:   10/01/2024   CMP ([facility] Center only)    Standing Status:   Future    Expected Date:   10/02/2023    Expiration Date:   10/01/2024   All questions were answered. The patient knows to call the clinic with any problems, questions or concerns. No barriers to learning was detected. The total time spent in the appointment was 25 minutes.     [name], MD 08/08/2023

## 2023-08-10 ENCOUNTER — Other Ambulatory Visit: Payer: Self-pay

## 2023-08-10 ENCOUNTER — Inpatient Hospital Stay

## 2023-08-10 VITALS — BP 121/78 | HR 59 | Temp 98.1°F | Resp 16

## 2023-08-10 DIAGNOSIS — D72829 Elevated white blood cell count, unspecified: Secondary | ICD-10-CM | POA: Diagnosis not present

## 2023-08-10 DIAGNOSIS — C182 Malignant neoplasm of ascending colon: Secondary | ICD-10-CM

## 2023-08-10 DIAGNOSIS — Z5111 Encounter for antineoplastic chemotherapy: Secondary | ICD-10-CM | POA: Diagnosis not present

## 2023-08-10 DIAGNOSIS — Z95828 Presence of other vascular implants and grafts: Secondary | ICD-10-CM

## 2023-08-10 DIAGNOSIS — Z8049 Family history of malignant neoplasm of other genital organs: Secondary | ICD-10-CM | POA: Diagnosis not present

## 2023-08-10 DIAGNOSIS — Z923 Personal history of irradiation: Secondary | ICD-10-CM | POA: Diagnosis not present

## 2023-08-10 DIAGNOSIS — C7963 Secondary malignant neoplasm of bilateral ovaries: Secondary | ICD-10-CM | POA: Diagnosis not present

## 2023-08-10 DIAGNOSIS — C786 Secondary malignant neoplasm of retroperitoneum and peritoneum: Secondary | ICD-10-CM | POA: Diagnosis not present

## 2023-08-10 DIAGNOSIS — Z5189 Encounter for other specified aftercare: Secondary | ICD-10-CM | POA: Diagnosis not present

## 2023-08-10 DIAGNOSIS — Z800 Family history of malignant neoplasm of digestive organs: Secondary | ICD-10-CM | POA: Diagnosis not present

## 2023-08-10 DIAGNOSIS — C787 Secondary malignant neoplasm of liver and intrahepatic bile duct: Secondary | ICD-10-CM | POA: Diagnosis not present

## 2023-08-10 DIAGNOSIS — Z803 Family history of malignant neoplasm of breast: Secondary | ICD-10-CM | POA: Diagnosis not present

## 2023-08-10 DIAGNOSIS — L989 Disorder of the skin and subcutaneous tissue, unspecified: Secondary | ICD-10-CM | POA: Diagnosis not present

## 2023-08-10 DIAGNOSIS — Z86000 Personal history of in-situ neoplasm of breast: Secondary | ICD-10-CM | POA: Diagnosis not present

## 2023-08-10 MED ORDER — HEPARIN SOD (PORK) LOCK FLUSH 100 UNIT/ML IV SOLN
500.0000 [IU] | Freq: Once | INTRAVENOUS | Status: AC | PRN
  Administered 2023-08-10: 500 [IU]

## 2023-08-10 MED ORDER — PEGFILGRASTIM-CBQV 6 MG/0.6ML ~~LOC~~ SOSY
6.0000 mg | PREFILLED_SYRINGE | Freq: Once | SUBCUTANEOUS | Status: AC
  Administered 2023-08-10: 6 mg via SUBCUTANEOUS
  Filled 2023-08-10: qty 0.6

## 2023-08-10 MED ORDER — SODIUM CHLORIDE 0.9% FLUSH
10.0000 mL | INTRAVENOUS | Status: DC | PRN
  Administered 2023-08-10: 10 mL

## 2023-08-20 NOTE — Progress Notes (Signed)
 Patient Care Team: [name], MD as PCP - General (Family Medicine) [name], MD as Consulting Physician (General Surgery) Sonja Rio Bravo, MD as Consulting Physician (Hematology) [name], MD as Consulting Physician (General Surgery) [name], MD as Consulting Physician (Hematology and Oncology) [facility], [facility]   Clinic Day:  08/21/2023  Referring physician: Sonja Bondurant, MD  ASSESSMENT & PLAN:   Assessment & Plan: Malignant neoplasm of ascending colon [facility]) pT3N2aM0 stage IIB, MSS, metastatic to b/l ovaries and peritoneum and liver, KRAS mutation G12V (+)  -Initially diagnosed in 08/2019, s/p resection with Dr Andy Bannister on 11/01/19. Path showed overall Stage IIIB cancer.  -s/p 6 months adjuvant FOLFOX, completed 06/01/20, oxaliplatin  held for final 2 cycles due to neuropathy.  -s/p BSO and hysterectomy on 12/22/21 with Dr. Orvil Bland. Path revealed metastatic moderately differentiated colonic adenocarcinoma involving both ovaries and peritoneum -she restarted FOLFOX on 01/24/22. -Due to disease progression, chemotherapy changed to second line FOLFIRI and bevacizumab  on April 06, 2022. She has been tolerating moderately well, better now with dose reduction, will continue -Restaging CT scan from 09/27/2022 showed continous response in liver and peritoneal metastasis, no other new lesions.   -We discussed maintenance therapy option in future  -due to her PE in 10/2022, will hold beva for 3 months  -restaging CT 12/31/2022 showed stable disease and live met is not well defined on CT. She restarted beva on 01/17/23  -restaging CT 06/19/2023 showed overall stable disease (stable small lung nodules, no other evidence of cancer) - 08/21/2023 -she presents for cycle 36 day 1 of FOLFIRI and bevacizumab . --restaging CT CAP ordered and to be scheduled in mid-May 2025.    Peripheral neuropathy secondary to chemotherapy Continues to take Lyrica  50 mg twice daily with  additional 25 mg at bedtime. Continue to use hand cream to prevent peeling and cracking of skin on hands and feet.   Bilateral lower extremity edema Reports worsening symptoms over last 2 weeks. Unable to position right leg in manner to wear compression socks.  Will start using compression boots more often. They are heated, provide compression, feel good, and are effective.  Will also rest and sit with legs elevated when possible.    Plan: Reviewed labs  -stable and mild anemia. Other labs are unremarkable.  -CEA pending.  CT CAP ordered for mid May 2025.  Recommend use of compression boots when possible to reduce lower extremity edema. Swelling is non pitting without redness or heat. Lower legd and feet are not tender with palpation.  Recommend taking biotin daily to help grow strong nails, hair, and improve skin. Continue to use hand cream when possible to prevent cracking and peeling of hands and feet.  Proceed with chemotherapy today FOLFIRI and bevacizumab .  Labs/flush, follow up, and treatment as scheduled.   The patient understands the plans discussed today and is in agreement with them.  She knows to contact our office if she develops concerns prior to her next appointment.  I provided 25 minutes of face-to-face time during this encounter and > 50% was spent counseling as documented under my assessment and plan.    Sharyon Deis, NP  [facility] - A DEPT OF [facility] [address] [place] [address] Dept: [phone] Dept Fax: [phone]   Orders Placed This Encounter  Procedures   CT CHEST ABDOMEN PELVIS W CONTRAST    Standing Status:   Future  Expected Date:   09/11/2023    Expiration Date:   08/20/2024    If indicated for the ordered procedure, I authorize the administration of contrast media per Radiology protocol:   Yes    Does the patient have a contrast media/X-ray dye allergy?:   No     Preferred imaging location?:   [facility]    If indicated for the ordered procedure, I authorize the administration of oral contrast media per Radiology protocol:   Yes      CHIEF COMPLAINT:  CC: Cancer of ascending colon  Current Treatment: FOLFIRI and bevacizumab   INTERVAL HISTORY:  Kim Castro is here today for repeat clinical assessment.  Last seen by Dr. Maryalice Smaller on 08/08/2023. Reports unchanged numbness of lower extremities. This has been going on since she had knee replacement surgery. Has noted brittle fingernails. Skin peeling and cracking on her fingertips. She has also noted some mild swelling in bilateral lower extremities. Not getting better with rest and elevation. She denies chest pain, chest pressure, or shortness of breath. She denies headaches or visual disturbances. She denies abdominal pain, nausea, vomiting, or changes in bowel or bladder habits.   She denies fevers or chills. She denies pain. Her appetite is good. Her weight has been stable.  I have reviewed the past medical history, past surgical history, social history and family history with the patient and they are unchanged from previous note.  ALLERGIES:  is allergic to oxycodone .  MEDICATIONS:  Current Outpatient Medications  Medication Sig Dispense Refill   apixaban  (ELIQUIS ) 5 MG TABS tablet Take 1 tablet (5 mg total) by mouth 2 (two) times daily. 60 tablet 5   b complex vitamins capsule Take 1 capsule by mouth daily.     Cholecalciferol (VITAMIN D) 2000 UNITS CAPS Take 2,000 Units by mouth daily.      ibuprofen (ADVIL) 200 MG tablet Take 200 mg by mouth daily as needed (pain).     lidocaine -prilocaine  (EMLA ) cream Apply 1 Application topically as needed. 30 g 1   loratadine (CLARITIN) 10 MG tablet Take 10 mg by mouth daily as needed for allergies.     losartan  (COZAAR ) 25 MG tablet Take 1 tablet (25 mg total) by mouth daily. 90 tablet 3   metFORMIN  (GLUCOPHAGE ) 500 MG tablet Take 1 tablet (500 mg total) by  mouth in the morning and at bedtime. 180 tablet 3   Multiple Vitamins-Minerals (MULTIVITAMIN WITH MINERALS) tablet Take 1 tablet by mouth daily.     omeprazole (PRILOSEC) 20 MG capsule Take 20 mg by mouth daily before breakfast.      ondansetron  (ZOFRAN ) 8 MG tablet Take 1 tablet (8 mg total) by mouth every 8 (eight) hours as needed for nausea or vomiting. Start on the third day after chemotherapy. 30 tablet 1   potassium chloride  (KLOR-CON ) 10 MEQ tablet Take 1 tablet (10 mEq total) by mouth 2 (two) times daily. 60 tablet 2   pravastatin  (PRAVACHOL ) 10 MG tablet Take 1 tablet (10 mg total) by mouth daily. 90 tablet 3   pregabalin  (LYRICA ) 25 MG capsule Take 1 capsule (25 mg total) by mouth at bedtime. In addition to 50mg  at bedtime (for total of 75 mg at bedtime) 30 capsule 1   pregabalin  (LYRICA ) 50 MG capsule Take 1 capsule (50 mg total) by mouth 2 (two) times daily. Take additional 25 mg at bedtime (for total of 75,mg at bedtime). 60 capsule 2   prochlorperazine  (COMPAZINE ) 10 MG tablet Take 1 tablet (10  mg total) by mouth every 6 (six) hours as needed for nausea or vomiting. 30 tablet 1   traMADol  (ULTRAM ) 50 MG tablet Take 1 tablet (50 mg total) by mouth every 6 (six) hours as needed. 30 tablet 0   triamcinolone  ointment (KENALOG ) 0.5 % Apply 1 Application topically 2 (two) times daily. 30 g 0   No current facility-administered medications for this visit.   Facility-Administered Medications Ordered in Other Visits  Medication Dose Route Frequency Provider Last Rate Last Admin   0.9 %  sodium chloride  infusion   Intravenous Continuous Sonja [place], MD 10 mL/hr at 08/21/23 1014 New Bag at 08/21/23 1014   atropine  injection 0.5 mg  0.5 mg Intravenous Once PRN Sonja [place], MD       bevacizumab -awwb (MVASI ) 500 mg in sodium chloride  0.9 % 100 mL chemo infusion  5 mg/kg (Treatment Plan Recorded) Intravenous Once Sonja [place], MD       fluorouracil  (ADRUCIL ) 5,000 mg in sodium chloride  0.9 % 150 mL chemo  infusion  2,400 mg/m2 (Treatment Plan Recorded) Intravenous 1 day or 1 dose Sonja [place], MD       heparin  lock flush 100 unit/mL  500 Units Intracatheter Once PRN Sonja [place], MD       irinotecan  (CAMPTOSAR ) 220 mg in sodium chloride  0.9 % 500 mL chemo infusion  100 mg/m2 (Treatment Plan Recorded) Intravenous Once Sonja [place], MD       leucovorin  896 mg in sodium chloride  0.9 % 250 mL infusion  400 mg/m2 (Treatment Plan Recorded) Intravenous Once Sonja [place], MD       sodium chloride  flush (NS) 0.9 % injection 10 mL  10 mL Intracatheter PRN Sonja [place], MD        HISTORY OF PRESENT ILLNESS:   Oncology History Overview Note   Cancer Staging  Malignant neoplasm of female breast [facility]) Staging form: Breast, AJCC 7th Edition - Clinical stage from 06/11/2014: Stage 0 (Tis (DCIS), N0, M0) - Unsigned Staged by: Pathologist and managing physician Laterality: Left Estrogen receptor status: Positive Progesterone receptor status: Positive Stage used in treatment planning: Yes National guidelines used in treatment planning: Yes Type of national guideline used in treatment planning: NCCN - Pathologic stage from 07/03/2014: Stage Unknown (Tis (DCIS), NX, cM0) - Signed by Windle Hatch, MD on 07/10/2014 Staged by: Pathologist Laterality: Left Estrogen receptor status: Positive Progesterone receptor status: Positive Stage used in treatment planning: Yes National guidelines used in treatment planning: Yes Type of national guideline used in treatment planning: NCCN Staging comments: Staged on final lumpectomy specimen by Dr. Asa Bjork.  Right colon cancer Staging form: Colon and Rectum, AJCC 8th Edition - Pathologic stage from 11/01/2019: Stage IIIB (pT3, pN2a, cM0) - Signed by Sonja [place], MD on 11/29/2019 Stage prefix: Initial diagnosis Histologic grading system: 4 grade system Histologic grade (G): G2 Residual tumor (R): R0 - None Tumor deposits (TD): Absent Perineural invasion (PNI): Absent Microsatellite  instability (MSI): Stable KRAS mutation: Unknown NRAS mutation: Unknown BRAF mutation: Unknown     Malignant neoplasm of female breast (HCC)  05/29/2014 Initial Biopsy   Left breast needle core biopsy: Grade 2, DCIS with calcs. ER+ (100%), PR+ (96%).    06/04/2014 Initial Diagnosis   Left breast DCIS with calcifications, ER 100%, PR 96%   06/10/2014 Breast MRI   Left breast: 2.4 x 1.3 x 1.1 cm area of patchy non-mass enhancement upper outer quadrant includes postbiopsy seroma; Right breast: 1.2 cm previously biopsied stable benign fibroadenoma   06/12/2014 Procedure  Genetic counseling/testing: Identified 1 VUS on CHEK2 gene. Remainder of 17 gene panel tested negative and included: ATM, BARD1, BRCA 1/2, BRIP1, CDH1, CHEK2, EPCAM, MLH1, MSH2, MSH6, NBN, NF1, PALB2, PTEN, RAD50, RAD51C, RAD51D, STK11, and TP53.    07/01/2014 Surgery   Left breast lumpectomy ([facility]): Grade 1, DCIS, spanning 2.3 cm, 1 mm margin, ER 100%, PR 96%   07/31/2014 - 08/28/2014 Radiation Therapy   Adjuvant RT completed Katheryn Pandy). Left breast: Total dose 42.5 Gy over 17 fractions. Left breast boost: Total dose 7.5 Gy over 3 fractions.    09/14/2014 - 01/13/2020 Anti-estrogen oral therapy   Anastrazole 1mg  daily. Planned duration of treatment: 5 years [place]). Completed in 01/2020.    09/25/2014 Survivorship   Survivorship Care Plan given to patient and reviewed with her in person.    03/02/2021 Imaging   CT CAP  IMPRESSION: 1. No findings of active/recurrent malignancy. Partial right hemicolectomy. 2. Endometrial stripe remains mildly thickened, but endometrial biopsy in May was negative for malignancy. 3. Progressive endplate sclerosis and endplate irregularity at T2-3, probably due to degenerative endplate findings. If the has referable upper thoracic pain/symptoms then thoracic spine MRI could be used for further workup. 4. Other imaging findings of potential clinical significance: Mild cardiomegaly. Aortic  Atherosclerosis (ICD10-I70.0). Mild mitral valve calcification. Postoperative findings in the left breast with adjacent radiation port anteriorly in the left upper lobe. Tiny pulmonary nodules in the left lower lobe are unchanged from earliest available comparison of 10/17/2019 and probably benign although may merit surveillance. Multilevel lumbar impingement. Mild pelvic floor laxity.   Malignant neoplasm of ascending colon (HCC)  09/19/2019 Imaging   CT AP W contrast 09/19/19  IMPRESSION Fullness in the cecum, cannot exclude a mass. No evidence for metastatic disease is identified.    09/24/2019 Procedure   Colonoscopy by Dr Ardine Beckwith 09/24/19 IMPRESSION 1. The colon was redundant  2. Mild diverticulosis was noted through the entire examined colon 3. Single 12mm polyp was found in the ascending colon; polypectomy was performed using snare cautery and biopsy forceps 4. Mild diverticulosis was notes in the descending colon and sigmoid colon.  5. Single polyp was found in the sigmoid colon, polypectomy was performed with cold forceps.  6. Single polyp was found in the rectosigmoid colon; polypectomy was performed with cold snare  7. Small internal hemorrhoids  8. Large mass was found at the cecum; multiple biopsies of the area were performed using cold forceps; injection (tattooing) was performed distal to the mass.    09/24/2019 Initial Biopsy   INTERPRETATION AND DIAGNOSIS:  A. Cecum, biopsy:  Invasive moderately differentiated adenocarcinoma.  see comment  B. Polyp @ ascending colon, polypectomy:  Tubular Adenoma  C. Polyp @ sigmoid colon Polypectomy:  hyperplastic polyp.  D. Polyp @ rectosigmoid colon, Polypectomy:  Hyperplastic Polyp      10/16/2019 Imaging   CT Chest IMPRESSION: 1. Multiple small pulmonary nodules measuring 5 mm or less in size in the lungs. These are nonspecific and are typically considered statistically likely benign. However, given the patient's history  of primary malignancy, close attention on follow-up studies is recommended to ensure stability. 2. Aortic atherosclerosis, in addition to right coronary artery disease. Assessment for potential risk factor modification, dietary therapy or pharmacologic therapy may be warranted, if clinically indicated. 3. There are calcifications of the aortic valve and mitral annulus. Echocardiographic correlation for evaluation of potential valvular dysfunction may be warranted if clinically indicated. 4. Small hiatal hernia.   Aortic Atherosclerosis (ICD10-I70.0).   11/01/2019  Initial Diagnosis   Colon cancer (HCC)   11/01/2019 Surgery   LAPAROSCOPIC PARTIAL COLECTOMY by Dr Andy Bannister and Dr Hershell Lose   11/01/2019 Pathology Results   FINAL MICROSCOPIC DIAGNOSIS:   A. COLON, PROXIMAL RIGHT, COLECTOMY:  - Invasive colonic adenocarcinoma, 5 cm.  - Tumor invades through the muscularis propria into pericolonic tissues.   - Margins of resection are not involved.  - Metastatic carcinoma in (5) of (13) lymph nodes.  - See oncology table.    MSI Stable  Mismatch repair normal  MLH1 - Preserved nuclear expression (greater 50% tumor expression) MSH2 - Preserved nuclear expression (greater 50% tumor expression) MSH6 - Preserved nuclear expression (greater 50% tumor expression) PMS2 - Preserved nuclear expression (greater 50% tumor expression)   11/01/2019 Cancer Staging   Staging form: Colon and Rectum, AJCC 8th Edition - Pathologic stage from 11/01/2019: Stage IIIB (pT3, pN2a, cM0) - Signed by Sonja [place], MD on 11/29/2019   12/10/2019 Procedure   PAC placed 12/10/19   12/17/2019 - 06/01/2020 Chemotherapy   FOLFOX q2weeks starting in 2 weeks starting 12/17/19. Held 01/27/20-02/10/20 due to b/l PE. Oxaliplatin  held C11-12 due to neuropathy. Completed on 06/01/20   03/02/2021 Imaging   CT CAP  IMPRESSION: 1. No findings of active/recurrent malignancy. Partial right hemicolectomy. 2. Endometrial stripe remains mildly  thickened, but endometrial biopsy in May was negative for malignancy. 3. Progressive endplate sclerosis and endplate irregularity at T2-3, probably due to degenerative endplate findings. If the has referable upper thoracic pain/symptoms then thoracic spine MRI could be used for further workup. 4. Other imaging findings of potential clinical significance: Mild cardiomegaly. Aortic Atherosclerosis (ICD10-I70.0). Mild mitral valve calcification. Postoperative findings in the left breast with adjacent radiation port anteriorly in the left upper lobe. Tiny pulmonary nodules in the left lower lobe are unchanged from earliest available comparison of 10/17/2019 and probably benign although may merit surveillance. Multilevel lumbar impingement. Mild pelvic floor laxity.   08/26/2021 Imaging   EXAM: CT CHEST, ABDOMEN, AND PELVIS WITH CONTRAST  IMPRESSION: 1. Stable examination without new or progressive findings to suggest local recurrence or metastatic disease within the chest, abdomen, or pelvis. 2. Hepatomegaly with hepatic steatosis. 3. Sigmoid colonic diverticulosis without findings of acute diverticulitis. 4. Similar prominent endplate sclerosis and irregularity at T2-T3 is most consistent with Modic type endplate degenerative changes. However, if patient has referable upper thoracic pain consider further workup with thoracic spine MRI. 5. Similar thickening of the endometrial stripe measuring up to 8 mm, which was previously biopsied with results negative for malignancy. 6. Aortic Atherosclerosis (ICD10-I70.0).   11/10/2021 PET scan   IMPRESSION: 1. LEFT ovary is increased in size and is intensely hypermetabolic. While physiologic hypermetabolic ovarian tissue is not uncommon, the enlargement and asymmetric activity warrants further evaluation. Consider contrast pelvic MRI vs tissue sampling. 2. No evidence of metastatic colorectal carcinoma otherwise. 3. Post RIGHT hemicolectomy  anatomy. 4. Evidence of radiation change in the LEFT upper lobe (remote breast cancer).     12/22/2021 Relapse/Recurrence    FINAL MICROSCOPIC DIAGNOSIS:   A. LEFT OVARY AND FALLOPIAN TUBE, SALPINGO OOPHORECTOMY:  - Metastatic moderately differentiated colonic adenocarcinoma involving left ovary  - Focal ovarian stromal calcification  - Segment of benign fallopian tube   B. UTERUS WITH RIGHT FALLOPIAN TUBE AND OVARY, HYSTERECTOMY AND SALPINGO-OOPHORECTOMY:  - Metastatic moderately differentiated colonic adenocarcinoma involving right ovary  - Focal invasive extensive adenomyosis  - Benign endometrial polyps  - Benign proliferative phase endometrium  - Hydrosalpinx of right fallopian tube  -  Focal ovarian stromal calcification   C. PERITONEAL DEPOSITS, ANTERIOR CUL DE SAC, BIOPSY:  - Metastatic mucinous adenocarcinoma, consistent with colorectal primary    COMMENT:  Immunohistochemical stains show that the tumor cells are positive for CK20 and CDX2 while they are negative for CK7 and PAX8, consistent with above interpretation.    01/24/2022 - 03/23/2022 Chemotherapy   Patient is on Treatment Plan : COLORECTAL FOLFOX q14d x 3 months     04/01/2022 Imaging   CT AP IMPRESSION: 1. New omental metastatic disease. 2. Vague hypoattenuating lesion in the inferior right hepatic lobe is new and also worrisome for metastatic disease. 3. Similar small right lower lobe nodules. Recommend continued attention on follow-up. 4. Steatotic enlarged liver. 5.  Aortic atherosclerosis (ICD10-I70.0).   04/06/2022 -  Chemotherapy   Patient is on Treatment Plan : COLORECTAL FOLFIRI + Bevacizumab  q14d     04/21/2022 Imaging    IMPRESSION: 1. Stable chest CT. No evidence of metastatic disease. 2. Stable small pulmonary nodules, considered benign based on stability. 3. Stable postsurgical changes in the left breast and anterior left upper lobe radiation changes. 4. Aortic Atherosclerosis  (ICD10-I70.0) and Emphysema (ICD10-J43.9).   04/21/2022 Imaging    IMPRESSION: 1. Small enhancing lesion inferiorly in the right hepatic lobe is typical of metastatic disease and stable from recent abdominal CT. No other definite liver lesions are identified. Mild hepatic steatosis. 2. No other significant abdominal findings. 3. Omental nodularity seen on CT is not well visualized by MRI.    Imaging     06/23/2022 Imaging    IMPRESSION: 1. Hypodense lesion of the inferior right lobe of the liver, hepatic segment VI is diminished in size, consistent with treatment response. 2. Tiny peritoneal and omental nodules identified by prior examination are diminished in size, consistent with treatment response. 3. Multiple tiny pulmonary nodules unchanged, most likely benign and incidental, however continued attention on follow-up warranted in the setting of known metastatic disease. 4. No evidence of new metastatic disease in the chest, abdomen, or pelvis. 5. Status post partial right hemicolectomy and reanastomosis. 6. Diffuse mosaic attenuation of the airspaces, consistent with small airways disease. 7. Hepatomegaly.       REVIEW OF SYSTEMS:   Constitutional: Denies fevers, chills or abnormal weight loss Eyes: Denies blurriness of vision Ears, nose, mouth, throat, and face: Denies mucositis or sore throat Respiratory: Denies cough, dyspnea or wheezes Cardiovascular: Denies palpitation, chest discomfort or lower extremity swelling Gastrointestinal:  Denies nausea, heartburn or change in bowel habits Skin: Denies abnormal skin rashes. Has noted skin peeling and cracking of fingertips which is worse when she is unable to apply lotion. Lymphatics: Denies new lymphadenopathy or easy bruising Neurological:Denies numbness, tingling or new weaknesses Behavioral/Psych: Mood is stable, no new changes  All other systems were reviewed with the patient and are negative.   VITALS:    Today's Vitals   08/21/23 0858 08/21/23 0922  BP: 130/60   Pulse: 61   Resp: 17   Temp: 97.7 F (36.5 C)   SpO2: 97%   Weight: 209 lb 3.2 oz (94.9 kg)   PainSc:  0-No pain   Body mass index is 34.28 kg/m.   Wt Readings from Last 3 Encounters:  08/21/23 209 lb 3.2 oz (94.9 kg)  08/08/23 210 lb 11.2 oz (95.6 kg)  07/24/23 210 lb 3.2 oz (95.3 kg)    Body mass index is 34.28 kg/m.  Performance status (ECOG): 1 - Symptomatic but completely ambulatory  PHYSICAL EXAM:  GENERAL:alert, no distress and comfortable SKIN: skin color, texture, turgor are normal, no rashes or significant lesions EYES: normal, Conjunctiva are pink and non-injected, sclera clear OROPHARYNX:no exudate, no erythema and lips, buccal mucosa, and tongue normal  NECK: supple, thyroid normal size, non-tender, without nodularity LYMPH:  no palpable lymphadenopathy in the cervical, axillary or inguinal LUNGS: clear to auscultation and percussion with normal breathing effort. Mild expiratory wheezing noted.  HEART: regular rate & rhythm and no murmurs. Very mild, non pitting edema in bilateral lower extremities. No redness or warmth appreciated today. No tenderness with palpation.  ABDOMEN:abdomen soft, non-tender and normal bowel sounds Musculoskeletal:no cyanosis of digits and no clubbing  NEURO: alert & oriented x 3 with fluent speech, no focal motor/sensory deficits  LABORATORY DATA:  I have reviewed the data as listed    Component Value Date/Time   NA 144 08/21/2023 0826   NA 144 10/03/2014 0914   K 4.1 08/21/2023 0826   K 4.6 10/03/2014 0914   CL 112 (H) 08/21/2023 0826   CO2 26 08/21/2023 0826   CO2 29 10/03/2014 0914   GLUCOSE 122 (H) 08/21/2023 0826   GLUCOSE 133 10/03/2014 0914   BUN 18 08/21/2023 0826   BUN 17.8 10/03/2014 0914   CREATININE 0.95 08/21/2023 0826   CREATININE 0.8 10/03/2014 0914   CALCIUM  9.0 08/21/2023 0826   CALCIUM  9.7 10/03/2014 0914   PROT 6.2 (L) 08/21/2023 0826    PROT 7.0 10/03/2014 0914   ALBUMIN  3.9 08/21/2023 0826   ALBUMIN  3.7 10/03/2014 0914   AST 24 08/21/2023 0826   AST 18 10/03/2014 0914   ALT 16 08/21/2023 0826   ALT 13 10/03/2014 0914   ALKPHOS 146 (H) 08/21/2023 0826   ALKPHOS 74 10/03/2014 0914   BILITOT 0.7 08/21/2023 0826   BILITOT 0.98 10/03/2014 0914   GFRNONAA >60 08/21/2023 0826   GFRAA >60 01/30/2020 0429   GFRAA >60 01/27/2020 0834    Lab Results  Component Value Date   WBC 8.8 08/21/2023   NEUTROABS 5.0 08/21/2023   HGB 10.0 (L) 08/21/2023   HCT 32.1 (L) 08/21/2023   MCV 102.6 (H) 08/21/2023   PLT 167 08/21/2023

## 2023-08-20 NOTE — Assessment & Plan Note (Addendum)
 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 Path showed overall Stage IIIB cancer.  -s/p 6 months adjuvant FOLFOX, completed 06/01/20, oxaliplatin  held for final 2 cycles due to neuropathy.  -s/p BSO and hysterectomy on 12/22/21 with Dr. Orvil Bland. Path revealed metastatic moderately differentiated colonic adenocarcinoma involving both ovaries and peritoneum -she restarted FOLFOX on 01/24/22. -Due to disease progression, chemotherapy changed to second line FOLFIRI and bevacizumab  on April 06, 2022. She has been tolerating moderately well, better now with dose reduction, will continue -Restaging CT scan from 09/27/2022 showed continous response in liver and peritoneal metastasis, no other new lesions.   -We discussed maintenance therapy option in future  -due to her PE in 10/2022, will hold beva for 3 months  -restaging CT 12/31/2022 showed stable disease and live met is not well defined on CT. She restarted beva on 01/17/23  -restaging CT 06/19/2023 showed overall stable disease (stable small lung nodules, no other evidence of cancer) - 08/21/2023 -she presents for cycle 36 day 1 of FOLFIRI and bevacizumab . --restaging CT CAP ordered and to be scheduled in mid-May 2025.

## 2023-08-21 ENCOUNTER — Inpatient Hospital Stay

## 2023-08-21 ENCOUNTER — Inpatient Hospital Stay: Admitting: Nurse Practitioner

## 2023-08-21 ENCOUNTER — Encounter: Payer: Self-pay | Admitting: Nurse Practitioner

## 2023-08-21 VITALS — BP 130/60 | HR 61 | Temp 97.7°F | Resp 17 | Wt 209.2 lb

## 2023-08-21 DIAGNOSIS — C182 Malignant neoplasm of ascending colon: Secondary | ICD-10-CM

## 2023-08-21 DIAGNOSIS — D72829 Elevated white blood cell count, unspecified: Secondary | ICD-10-CM | POA: Diagnosis not present

## 2023-08-21 DIAGNOSIS — Z5189 Encounter for other specified aftercare: Secondary | ICD-10-CM | POA: Diagnosis not present

## 2023-08-21 DIAGNOSIS — C786 Secondary malignant neoplasm of retroperitoneum and peritoneum: Secondary | ICD-10-CM | POA: Diagnosis not present

## 2023-08-21 DIAGNOSIS — L989 Disorder of the skin and subcutaneous tissue, unspecified: Secondary | ICD-10-CM | POA: Diagnosis not present

## 2023-08-21 DIAGNOSIS — C7963 Secondary malignant neoplasm of bilateral ovaries: Secondary | ICD-10-CM | POA: Diagnosis not present

## 2023-08-21 DIAGNOSIS — Z803 Family history of malignant neoplasm of breast: Secondary | ICD-10-CM | POA: Diagnosis not present

## 2023-08-21 DIAGNOSIS — Z5111 Encounter for antineoplastic chemotherapy: Secondary | ICD-10-CM | POA: Diagnosis not present

## 2023-08-21 DIAGNOSIS — Z923 Personal history of irradiation: Secondary | ICD-10-CM | POA: Diagnosis not present

## 2023-08-21 DIAGNOSIS — Z800 Family history of malignant neoplasm of digestive organs: Secondary | ICD-10-CM | POA: Diagnosis not present

## 2023-08-21 DIAGNOSIS — Z95828 Presence of other vascular implants and grafts: Secondary | ICD-10-CM

## 2023-08-21 DIAGNOSIS — Z8049 Family history of malignant neoplasm of other genital organs: Secondary | ICD-10-CM | POA: Diagnosis not present

## 2023-08-21 DIAGNOSIS — C787 Secondary malignant neoplasm of liver and intrahepatic bile duct: Secondary | ICD-10-CM | POA: Diagnosis not present

## 2023-08-21 DIAGNOSIS — Z86000 Personal history of in-situ neoplasm of breast: Secondary | ICD-10-CM | POA: Diagnosis not present

## 2023-08-21 LAB — CBC WITH DIFFERENTIAL (CANCER CENTER ONLY)
Abs Immature Granulocytes: 0.43 10*3/uL — ABNORMAL HIGH (ref 0.00–0.07)
Basophils Absolute: 0.1 10*3/uL (ref 0.0–0.1)
Basophils Relative: 1 %
Eosinophils Absolute: 0.1 10*3/uL (ref 0.0–0.5)
Eosinophils Relative: 1 %
HCT: 32.1 % — ABNORMAL LOW (ref 36.0–46.0)
Hemoglobin: 10 g/dL — ABNORMAL LOW (ref 12.0–15.0)
Immature Granulocytes: 5 %
Lymphocytes Relative: 30 %
Lymphs Abs: 2.6 10*3/uL (ref 0.7–4.0)
MCH: 31.9 pg (ref 26.0–34.0)
MCHC: 31.2 g/dL (ref 30.0–36.0)
MCV: 102.6 fL — ABNORMAL HIGH (ref 80.0–100.0)
Monocytes Absolute: 0.7 10*3/uL (ref 0.1–1.0)
Monocytes Relative: 8 %
Neutro Abs: 5 10*3/uL (ref 1.7–7.7)
Neutrophils Relative %: 55 %
Platelet Count: 167 10*3/uL (ref 150–400)
RBC: 3.13 MIL/uL — ABNORMAL LOW (ref 3.87–5.11)
RDW: 18.1 % — ABNORMAL HIGH (ref 11.5–15.5)
WBC Count: 8.8 10*3/uL (ref 4.0–10.5)
nRBC: 0.3 % — ABNORMAL HIGH (ref 0.0–0.2)

## 2023-08-21 LAB — CMP (CANCER CENTER ONLY)
ALT: 16 U/L (ref 0–44)
AST: 24 U/L (ref 15–41)
Albumin: 3.9 g/dL (ref 3.5–5.0)
Alkaline Phosphatase: 146 U/L — ABNORMAL HIGH (ref 38–126)
Anion gap: 6 (ref 5–15)
BUN: 18 mg/dL (ref 8–23)
CO2: 26 mmol/L (ref 22–32)
Calcium: 9 mg/dL (ref 8.9–10.3)
Chloride: 112 mmol/L — ABNORMAL HIGH (ref 98–111)
Creatinine: 0.95 mg/dL (ref 0.44–1.00)
GFR, Estimated: >60 mL/min (ref >60)
Glucose, Bld: 122 mg/dL — ABNORMAL HIGH (ref 70–99)
Potassium: 4.1 mmol/L (ref 3.5–5.1)
Sodium: 144 mmol/L (ref 135–145)
Total Bilirubin: 0.7 mg/dL (ref 0.0–1.2)
Total Protein: 6.2 g/dL — ABNORMAL LOW (ref 6.5–8.1)

## 2023-08-21 LAB — CEA (ACCESS): CEA (CHCC): 7.53 ng/mL — ABNORMAL HIGH (ref 0.00–5.00)

## 2023-08-21 MED ORDER — SODIUM CHLORIDE 0.9% FLUSH
10.0000 mL | INTRAVENOUS | Status: DC | PRN
  Administered 2023-08-21: 10 mL

## 2023-08-21 MED ORDER — SODIUM CHLORIDE 0.9 % IV SOLN
100.0000 mg/m2 | Freq: Once | INTRAVENOUS | Status: AC
  Administered 2023-08-21: 220 mg via INTRAVENOUS
  Filled 2023-08-21: qty 11

## 2023-08-21 MED ORDER — SODIUM CHLORIDE 0.9 % IV SOLN
400.0000 mg/m2 | Freq: Once | INTRAVENOUS | Status: AC
  Administered 2023-08-21: 896 mg via INTRAVENOUS
  Filled 2023-08-21: qty 44.8

## 2023-08-21 MED ORDER — SODIUM CHLORIDE 0.9 % IV SOLN
5.0000 mg/kg | Freq: Once | INTRAVENOUS | Status: AC
  Administered 2023-08-21: 500 mg via INTRAVENOUS
  Filled 2023-08-21: qty 4

## 2023-08-21 MED ORDER — PALONOSETRON HCL INJECTION 0.25 MG/5ML
0.2500 mg | Freq: Once | INTRAVENOUS | Status: AC
  Administered 2023-08-21: 0.25 mg via INTRAVENOUS
  Filled 2023-08-21: qty 5

## 2023-08-21 MED ORDER — SODIUM CHLORIDE 0.9 % IV SOLN: INTRAVENOUS | Status: DC

## 2023-08-21 MED ORDER — ATROPINE SULFATE 1 MG/ML IV SOLN
0.5000 mg | Freq: Once | INTRAVENOUS | Status: AC | PRN
  Administered 2023-08-21: 0.5 mg via INTRAVENOUS
  Filled 2023-08-21: qty 1

## 2023-08-21 MED ORDER — HEPARIN SOD (PORK) LOCK FLUSH 100 UNIT/ML IV SOLN: 500.0000 [IU] | Freq: Once | INTRAVENOUS | Status: DC | PRN

## 2023-08-21 MED ORDER — DEXAMETHASONE SODIUM PHOSPHATE 10 MG/ML IJ SOLN
10.0000 mg | Freq: Once | INTRAMUSCULAR | Status: AC
  Administered 2023-08-21: 10 mg via INTRAVENOUS
  Filled 2023-08-21: qty 1

## 2023-08-21 MED ORDER — SODIUM CHLORIDE 0.9 % IV SOLN
2400.0000 mg/m2 | INTRAVENOUS | Status: DC
  Administered 2023-08-21: 5000 mg via INTRAVENOUS
  Filled 2023-08-21: qty 100

## 2023-08-21 MED ORDER — SODIUM CHLORIDE 0.9% FLUSH
10.0000 mL | INTRAVENOUS | Status: DC | PRN
Start: 2023-08-21 — End: 2023-08-21
  Administered 2023-08-21: 10 mL

## 2023-08-21 NOTE — Patient Instructions (Signed)
 [facility]  Discharge Instructions: Thank you for choosing [facility] to provide your oncology and hematology care.   If you have a lab appointment with the Cancer Center, please go directly to the Cancer Center and check in at the registration area.   Wear comfortable clothing and clothing appropriate for easy access to any Portacath or PICC line.   We strive to give you quality time with your provider. You may need to reschedule your appointment if you arrive late (15 or more minutes).  Arriving late affects you and other patients whose appointments are after yours.  Also, if you miss three or more appointments without notifying the office, you may be dismissed from the clinic at the provider's discretion.      For prescription refill requests, have your pharmacy contact our office and allow 72 hours for refills to be completed.    Today you received the following chemotherapy and/or immunotherapy agents: Bevacizumab, Irinotecan, Leucovorin, 5FU      To help prevent nausea and vomiting after your treatment, we encourage you to take your nausea medication as directed.  BELOW ARE SYMPTOMS THAT SHOULD BE REPORTED IMMEDIATELY: *FEVER GREATER THAN 100.4 F (38 C) OR HIGHER *CHILLS OR SWEATING *NAUSEA AND VOMITING THAT IS NOT CONTROLLED WITH YOUR NAUSEA MEDICATION *UNUSUAL SHORTNESS OF BREATH *UNUSUAL BRUISING OR BLEEDING *URINARY PROBLEMS (pain or burning when urinating, or frequent urination) *BOWEL PROBLEMS (unusual diarrhea, constipation, pain near the anus) TENDERNESS IN MOUTH AND THROAT WITH OR WITHOUT PRESENCE OF ULCERS (sore throat, sores in mouth, or a toothache) UNUSUAL RASH, SWELLING OR PAIN  UNUSUAL VAGINAL DISCHARGE OR ITCHING   Items with * indicate a potential emergency and should be followed up as soon as possible or go to the Emergency Department if any problems should occur.  Please show the  CHEMOTHERAPY ALERT CARD or IMMUNOTHERAPY ALERT CARD at check-in to the Emergency Department and triage nurse.  Should you have questions after your visit or need to cancel or reschedule your appointment, please contact [facility]  Dept: [phone]  and follow the prompts.  Office hours are 8:00 a.m. to 4:30 p.m. Monday - Friday. Please note that voicemails left after 4:00 p.m. may not be returned until the following business day.  We are closed weekends and major holidays. You have access to a nurse at all times for urgent questions. Please call the main number to the clinic Dept: [phone] and follow the prompts.   For any non-urgent questions, you may also contact your provider using MyChart. We now offer e-Visits for anyone 64 and older to request care online for non-urgent symptoms. For details visit [email or web address].   Also download the MyChart app! Go to the app store, search "MyChart", open the app, select [facility], and log in with your MyChart username and password.

## 2023-08-23 ENCOUNTER — Encounter: Payer: Self-pay | Admitting: Nurse Practitioner

## 2023-08-23 ENCOUNTER — Inpatient Hospital Stay

## 2023-08-23 VITALS — BP 146/54 | HR 56 | Temp 98.2°F | Resp 18

## 2023-08-23 DIAGNOSIS — L989 Disorder of the skin and subcutaneous tissue, unspecified: Secondary | ICD-10-CM | POA: Diagnosis not present

## 2023-08-23 DIAGNOSIS — D72829 Elevated white blood cell count, unspecified: Secondary | ICD-10-CM | POA: Diagnosis not present

## 2023-08-23 DIAGNOSIS — Z5111 Encounter for antineoplastic chemotherapy: Secondary | ICD-10-CM | POA: Diagnosis not present

## 2023-08-23 DIAGNOSIS — C182 Malignant neoplasm of ascending colon: Secondary | ICD-10-CM | POA: Diagnosis not present

## 2023-08-23 DIAGNOSIS — C7963 Secondary malignant neoplasm of bilateral ovaries: Secondary | ICD-10-CM | POA: Diagnosis not present

## 2023-08-23 DIAGNOSIS — Z86000 Personal history of in-situ neoplasm of breast: Secondary | ICD-10-CM | POA: Diagnosis not present

## 2023-08-23 DIAGNOSIS — Z5189 Encounter for other specified aftercare: Secondary | ICD-10-CM | POA: Diagnosis not present

## 2023-08-23 DIAGNOSIS — C787 Secondary malignant neoplasm of liver and intrahepatic bile duct: Secondary | ICD-10-CM | POA: Diagnosis not present

## 2023-08-23 DIAGNOSIS — Z803 Family history of malignant neoplasm of breast: Secondary | ICD-10-CM | POA: Diagnosis not present

## 2023-08-23 DIAGNOSIS — Z923 Personal history of irradiation: Secondary | ICD-10-CM | POA: Diagnosis not present

## 2023-08-23 DIAGNOSIS — C786 Secondary malignant neoplasm of retroperitoneum and peritoneum: Secondary | ICD-10-CM | POA: Diagnosis not present

## 2023-08-23 DIAGNOSIS — Z800 Family history of malignant neoplasm of digestive organs: Secondary | ICD-10-CM | POA: Diagnosis not present

## 2023-08-23 DIAGNOSIS — Z8049 Family history of malignant neoplasm of other genital organs: Secondary | ICD-10-CM | POA: Diagnosis not present

## 2023-08-23 MED ORDER — PEGFILGRASTIM-CBQV 6 MG/0.6ML ~~LOC~~ SOSY
6.0000 mg | PREFILLED_SYRINGE | Freq: Once | SUBCUTANEOUS | Status: AC
  Administered 2023-08-23: 6 mg via SUBCUTANEOUS
  Filled 2023-08-23: qty 0.6

## 2023-08-23 MED ORDER — HEPARIN SOD (PORK) LOCK FLUSH 100 UNIT/ML IV SOLN
500.0000 [IU] | Freq: Once | INTRAVENOUS | Status: AC | PRN
  Administered 2023-08-23: 500 [IU]

## 2023-08-23 MED ORDER — SODIUM CHLORIDE 0.9% FLUSH
10.0000 mL | INTRAVENOUS | Status: DC | PRN
  Administered 2023-08-23: 10 mL

## 2023-09-01 NOTE — Assessment & Plan Note (Signed)
-  Dx in 05/2014, s/p left lumpectomy with Dr Johna Sheriff, adjuvant RT with Dr [name], and Anastrozole 08/2014-01/13/20   -DEXA 09/11/20 T score +1.2 normal.  -most recent mammogram on 10/31/2022 was negative.

## 2023-09-01 NOTE — Assessment & Plan Note (Signed)
 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 Path showed overall Stage IIIB cancer.  -s/p 6 months adjuvant FOLFOX, completed 06/01/20, oxaliplatin held for final 2 cycles due to neuropathy.  -s/p BSO and hysterectomy on 12/22/21 with Dr. Pricilla Holm. Path revealed metastatic moderately differentiated colonic adenocarcinoma involving both ovaries and peritoneum -she restarted FOLFOX on 01/24/22. -Due to disease progression, chemotherapy changed to second line FOLFIRI and bevacizumab on April 06, 2022. She has been tolerating moderately well, better now with dose reduction, will continue -Restaging CT scan from 09/27/2022 showed continous response in liver and peritoneal metastasis, no other new lesions.   -We discussed maintenance therapy option in future  -due to her PE in 10/2022, will hold beva for 3 months  -restaging CT 12/31/2022 showed stable disease and live met is not well defined on CT. She restarted beva on 01/17/23  -restaging CT 06/19/2023 showed overall stable disease (stable small lung nodules, no other evidence of cancer)

## 2023-09-04 ENCOUNTER — Encounter: Payer: Self-pay | Admitting: Hematology

## 2023-09-04 ENCOUNTER — Inpatient Hospital Stay: Attending: Hematology

## 2023-09-04 ENCOUNTER — Inpatient Hospital Stay: Admitting: Hematology

## 2023-09-04 ENCOUNTER — Inpatient Hospital Stay

## 2023-09-04 VITALS — BP 138/76 | HR 73 | Temp 97.6°F | Resp 20 | Ht 65.5 in | Wt 212.4 lb

## 2023-09-04 DIAGNOSIS — M549 Dorsalgia, unspecified: Secondary | ICD-10-CM | POA: Insufficient documentation

## 2023-09-04 DIAGNOSIS — C7963 Secondary malignant neoplasm of bilateral ovaries: Secondary | ICD-10-CM | POA: Diagnosis not present

## 2023-09-04 DIAGNOSIS — G8929 Other chronic pain: Secondary | ICD-10-CM | POA: Insufficient documentation

## 2023-09-04 DIAGNOSIS — C182 Malignant neoplasm of ascending colon: Secondary | ICD-10-CM | POA: Insufficient documentation

## 2023-09-04 DIAGNOSIS — C50919 Malignant neoplasm of unspecified site of unspecified female breast: Secondary | ICD-10-CM

## 2023-09-04 DIAGNOSIS — C787 Secondary malignant neoplasm of liver and intrahepatic bile duct: Secondary | ICD-10-CM | POA: Insufficient documentation

## 2023-09-04 DIAGNOSIS — Z170 Estrogen receptor positive status [ER+]: Secondary | ICD-10-CM

## 2023-09-04 DIAGNOSIS — Z853 Personal history of malignant neoplasm of breast: Secondary | ICD-10-CM | POA: Diagnosis not present

## 2023-09-04 DIAGNOSIS — Z95828 Presence of other vascular implants and grafts: Secondary | ICD-10-CM

## 2023-09-04 DIAGNOSIS — C786 Secondary malignant neoplasm of retroperitoneum and peritoneum: Secondary | ICD-10-CM | POA: Diagnosis not present

## 2023-09-04 DIAGNOSIS — D649 Anemia, unspecified: Secondary | ICD-10-CM | POA: Diagnosis not present

## 2023-09-04 DIAGNOSIS — Z5111 Encounter for antineoplastic chemotherapy: Secondary | ICD-10-CM | POA: Diagnosis not present

## 2023-09-04 DIAGNOSIS — Z5189 Encounter for other specified aftercare: Secondary | ICD-10-CM | POA: Diagnosis not present

## 2023-09-04 LAB — CMP (CANCER CENTER ONLY)
ALT: 22 U/L (ref 0–44)
AST: 36 U/L (ref 15–41)
Albumin: 3.8 g/dL (ref 3.5–5.0)
Alkaline Phosphatase: 158 U/L — ABNORMAL HIGH (ref 38–126)
Anion gap: 7 (ref 5–15)
BUN: 17 mg/dL (ref 8–23)
CO2: 26 mmol/L (ref 22–32)
Calcium: 8.9 mg/dL (ref 8.9–10.3)
Chloride: 111 mmol/L (ref 98–111)
Creatinine: 0.89 mg/dL (ref 0.44–1.00)
GFR, Estimated: >60 mL/min (ref >60)
Glucose, Bld: 106 mg/dL — ABNORMAL HIGH (ref 70–99)
Potassium: 3.9 mmol/L (ref 3.5–5.1)
Sodium: 144 mmol/L (ref 135–145)
Total Bilirubin: 0.6 mg/dL (ref 0.0–1.2)
Total Protein: 6.1 g/dL — ABNORMAL LOW (ref 6.5–8.1)

## 2023-09-04 LAB — CBC WITH DIFFERENTIAL (CANCER CENTER ONLY)
Abs Immature Granulocytes: 1.22 10*3/uL — ABNORMAL HIGH (ref 0.00–0.07)
Basophils Absolute: 0.1 10*3/uL (ref 0.0–0.1)
Basophils Relative: 1 %
Eosinophils Absolute: 0.3 10*3/uL (ref 0.0–0.5)
Eosinophils Relative: 2 %
HCT: 32.4 % — ABNORMAL LOW (ref 36.0–46.0)
Hemoglobin: 10.3 g/dL — ABNORMAL LOW (ref 12.0–15.0)
Immature Granulocytes: 9 %
Lymphocytes Relative: 24 %
Lymphs Abs: 3.2 10*3/uL (ref 0.7–4.0)
MCH: 32.7 pg (ref 26.0–34.0)
MCHC: 31.8 g/dL (ref 30.0–36.0)
MCV: 102.9 fL — ABNORMAL HIGH (ref 80.0–100.0)
Monocytes Absolute: 1 10*3/uL (ref 0.1–1.0)
Monocytes Relative: 7 %
Neutro Abs: 7.7 10*3/uL (ref 1.7–7.7)
Neutrophils Relative %: 57 %
Platelet Count: 181 10*3/uL (ref 150–400)
RBC: 3.15 MIL/uL — ABNORMAL LOW (ref 3.87–5.11)
RDW: 18.5 % — ABNORMAL HIGH (ref 11.5–15.5)
WBC Count: 13.4 10*3/uL — ABNORMAL HIGH (ref 4.0–10.5)
nRBC: 0.3 % — ABNORMAL HIGH (ref 0.0–0.2)

## 2023-09-04 LAB — TOTAL PROTEIN, URINE DIPSTICK: Protein, ur: NEGATIVE mg/dL

## 2023-09-04 LAB — CEA (ACCESS): CEA (CHCC): 9.92 ng/mL — ABNORMAL HIGH (ref 0.00–5.00)

## 2023-09-04 MED ORDER — SODIUM CHLORIDE 0.9 % IV SOLN
INTRAVENOUS | Status: DC
Start: 2023-09-04 — End: 2023-09-04

## 2023-09-04 MED ORDER — SODIUM CHLORIDE 0.9 % IV SOLN
5.0000 mg/kg | Freq: Once | INTRAVENOUS | Status: AC
  Administered 2023-09-04: 500 mg via INTRAVENOUS
  Filled 2023-09-04: qty 4

## 2023-09-04 MED ORDER — SODIUM CHLORIDE 0.9 % IV SOLN
2400.0000 mg/m2 | INTRAVENOUS | Status: DC
  Administered 2023-09-04: 5000 mg via INTRAVENOUS
  Filled 2023-09-04: qty 100

## 2023-09-04 MED ORDER — ATROPINE SULFATE 1 MG/ML IV SOLN
0.5000 mg | Freq: Once | INTRAVENOUS | Status: DC | PRN
Start: 2023-09-04 — End: 2023-09-04
  Filled 2023-09-04: qty 1

## 2023-09-04 MED ORDER — SODIUM CHLORIDE 0.9 % IV SOLN
400.0000 mg/m2 | Freq: Once | INTRAVENOUS | Status: AC
  Administered 2023-09-04: 896 mg via INTRAVENOUS
  Filled 2023-09-04: qty 44.8

## 2023-09-04 MED ORDER — PALONOSETRON HCL INJECTION 0.25 MG/5ML
0.2500 mg | Freq: Once | INTRAVENOUS | Status: AC
  Administered 2023-09-04: 0.25 mg via INTRAVENOUS
  Filled 2023-09-04: qty 5

## 2023-09-04 MED ORDER — SODIUM CHLORIDE 0.9 % IV SOLN
100.0000 mg/m2 | Freq: Once | INTRAVENOUS | Status: AC
  Administered 2023-09-04: 220 mg via INTRAVENOUS
  Filled 2023-09-04: qty 11

## 2023-09-04 MED ORDER — DEXAMETHASONE SODIUM PHOSPHATE 10 MG/ML IJ SOLN
10.0000 mg | Freq: Once | INTRAMUSCULAR | Status: AC
  Administered 2023-09-04: 10 mg via INTRAVENOUS
  Filled 2023-09-04: qty 1

## 2023-09-04 MED ORDER — SODIUM CHLORIDE 0.9% FLUSH
10.0000 mL | INTRAVENOUS | Status: DC | PRN
  Administered 2023-09-04: 10 mL

## 2023-09-04 NOTE — Progress Notes (Signed)
 Nutrition Follow-up:  Patient weight metastatic colon cancer.  Currently on folfiri and bevacizumab .   Met with patient during infusion.  Reports that her appetite decreases during the first 3-4 days following chemo them improves.  Food does not have taste during this week.  Eats foods high in protein.    Medications: imodium  Labs: reviewed  Anthropometrics:   Weight 212 lb today  216 lb on 2/24 215 lb on 11/11 216 lb on 8/5 211 lb on 10/10/22   NUTRITION DIAGNOSIS: Inadequate oral intake stable    INTERVENTION:  Continue foods high in protein and weight maintenance    MONITORING, EVALUATION, GOAL: weight trends, intake   NEXT VISIT: as needed  Eberardo Demello B. Zollie Hipp, CSO, LDN Registered Dietitian [phone]

## 2023-09-04 NOTE — Progress Notes (Signed)
 [facility]   Telephone:([phone] Fax:([phone]) [phone]   Clinic Follow up Note   Patient Care Team: [name], MD as PCP - General (Family Medicine) [name], MD as Consulting Physician (General Surgery) Sonja Ashley, MD as Consulting Physician (Hematology) [name], MD as Consulting Physician (General Surgery) [name], MD as Consulting Physician (Hematology and Oncology) [facility], [facility]   Date of Service:  09/04/2023  CHIEF COMPLAINT: f/u of colon cancer  CURRENT THERAPY:  FOLFIRI and bevacizumab   Oncology History   Malignant neoplasm of ascending colon (HCC) pT3N2aM0 stage IIB, MSS, metastatic to b/l ovaries and peritoneum and liver, KRAS mutation G12V (+)  -Initially diagnosed in 08/2019, s/p resection with Dr Andy Bannister on 11/01/19. Path showed overall Stage IIIB cancer.  -s/p 6 months adjuvant FOLFOX, completed 06/01/20, oxaliplatin  held for final 2 cycles due to neuropathy.  -s/p BSO and hysterectomy on 12/22/21 with Dr. Orvil Bland. Path revealed metastatic moderately differentiated colonic adenocarcinoma involving both ovaries and peritoneum -she restarted FOLFOX on 01/24/22. -Due to disease progression, chemotherapy changed to second line FOLFIRI and bevacizumab  on April 06, 2022. She has been tolerating moderately well, better now with dose reduction, will continue -Restaging CT scan from 09/27/2022 showed continous response in liver and peritoneal metastasis, no other new lesions.   -We discussed maintenance therapy option in future  -due to her PE in 10/2022, will hold beva for 3 months  -restaging CT 12/31/2022 showed stable disease and live met is not well defined on CT. She restarted beva on 01/17/23  -restaging CT 06/19/2023 showed overall stable disease (stable small lung nodules, no other evidence of cancer)  Malignant neoplasm of female breast (HCC) -Dx in 05/2014, s/p left lumpectomy with Dr Alray Askew, adjuvant RT  with Dr Katheryn Pandy, and Anastrozole  08/2014-01/13/20   -DEXA 09/11/20 T score +1.2 normal.  -most recent mammogram on 10/31/2022 was negative.     Assessment & Plan Metastatic colon cancer Metastatic colon cancer, well-managed. Tumor marker stable at 7-8. No new symptoms. On current regimen since December 2023, with cancer since May 2021. Tolerating treatment well. Discussed potential chemotherapy adjustment if increased fatigue or side effects occur, including maintenance therapy with irinotecan  and bevacizumab  without the pump or oral Xeloda. - Continue erythritol and bevacizumab  regimen. - Schedule CT scan next week to assess disease status. - Review CT scan results in two weeks. - Discuss potential chemotherapy adjustment if increased fatigue or side effects occur.  Mild anemia Mild anemia, well-managed. No new symptoms. Blood counts stable with slightly elevated white blood cell count, consistent with previous results.  Back pain Chronic back pain, well-managed. Previous severe pain resolved. Evaluated by Dr. Margery Sheets, advised against surgery, suggested cortisone shots if pain worsens. Prefers to avoid surgery, will consider cortisone shots based on symptom severity. - Consider cortisone shots if pain worsens.  Plan - Labs reviewed, CMP still pending, if adequate, will proceed to chemotherapy today and continue every 2 weeks - She is scheduled for restaging scan next week - Follow-up in 2 weeks     SUMMARY OF ONCOLOGIC HISTORY: Oncology History Overview Note   Cancer Staging  Malignant neoplasm of female breast [facility]) Staging form: Breast, AJCC 7th Edition - Clinical stage from 06/11/2014: Stage 0 (Tis (DCIS), N0, M0) - Unsigned Staged by: Pathologist and managing physician Laterality: Left Estrogen receptor status: Positive Progesterone receptor status: Positive Stage used in treatment planning: Yes National guidelines used in treatment planning: Yes Type of national guideline used  in treatment  planning: NCCN - Pathologic stage from 07/03/2014: Stage Unknown (Tis (DCIS), NX, cM0) - Signed by Windle Hatch, MD on 07/10/2014 Staged by: Pathologist Laterality: Left Estrogen receptor status: Positive Progesterone receptor status: Positive Stage used in treatment planning: Yes National guidelines used in treatment planning: Yes Type of national guideline used in treatment planning: NCCN Staging comments: Staged on final lumpectomy specimen by Dr. Asa Bjork.  Right colon cancer Staging form: Colon and Rectum, AJCC 8th Edition - Pathologic stage from 11/01/2019: Stage IIIB (pT3, pN2a, cM0) - Signed by Sonja St. James, MD on 11/29/2019 Stage prefix: Initial diagnosis Histologic grading system: 4 grade system Histologic grade (G): G2 Residual tumor (R): R0 - None Tumor deposits (TD): Absent Perineural invasion (PNI): Absent Microsatellite instability (MSI): Stable KRAS mutation: Unknown NRAS mutation: Unknown BRAF mutation: Unknown     Malignant neoplasm of female breast (HCC)  05/29/2014 Initial Biopsy   Left breast needle core biopsy: Grade 2, DCIS with calcs. ER+ (100%), PR+ (96%).    06/04/2014 Initial Diagnosis   Left breast DCIS with calcifications, ER 100%, PR 96%   06/10/2014 Breast MRI   Left breast: 2.4 x 1.3 x 1.1 cm area of patchy non-mass enhancement upper outer quadrant includes postbiopsy seroma; Right breast: 1.2 cm previously biopsied stable benign fibroadenoma   06/12/2014 Procedure   Genetic counseling/testing: Identified 1 VUS on CHEK2 gene. Remainder of 17 gene panel tested negative and included: ATM, BARD1, BRCA 1/2, BRIP1, CDH1, CHEK2, EPCAM, MLH1, MSH2, MSH6, NBN, NF1, PALB2, PTEN, RAD50, RAD51C, RAD51D, STK11, and TP53.    07/01/2014 Surgery   Left breast lumpectomy ([facility]): Grade 1, DCIS, spanning 2.3 cm, 1 mm margin, ER 100%, PR 96%   07/31/2014 - 08/28/2014 Radiation Therapy   Adjuvant RT completed Katheryn Pandy). Left breast: Total dose 42.5 Gy over 17  fractions. Left breast boost: Total dose 7.5 Gy over 3 fractions.    09/14/2014 - 01/13/2020 Anti-estrogen oral therapy   Anastrazole 1mg  daily. Planned duration of treatment: 5 years [place]). Completed in 01/2020.    09/25/2014 Survivorship   Survivorship Care Plan given to patient and reviewed with her in person.    03/02/2021 Imaging   CT CAP  IMPRESSION: 1. No findings of active/recurrent malignancy. Partial right hemicolectomy. 2. Endometrial stripe remains mildly thickened, but endometrial biopsy in May was negative for malignancy. 3. Progressive endplate sclerosis and endplate irregularity at T2-3, probably due to degenerative endplate findings. If the has referable upper thoracic pain/symptoms then thoracic spine MRI could be used for further workup. 4. Other imaging findings of potential clinical significance: Mild cardiomegaly. Aortic Atherosclerosis (ICD10-I70.0). Mild mitral valve calcification. Postoperative findings in the left breast with adjacent radiation port anteriorly in the left upper lobe. Tiny pulmonary nodules in the left lower lobe are unchanged from earliest available comparison of 10/17/2019 and probably benign although may merit surveillance. Multilevel lumbar impingement. Mild pelvic floor laxity.   Malignant neoplasm of ascending colon (HCC)  09/19/2019 Imaging   CT AP W contrast 09/19/19  IMPRESSION Fullness in the cecum, cannot exclude a mass. No evidence for metastatic disease is identified.    09/24/2019 Procedure   Colonoscopy by Dr Ardine Beckwith 09/24/19 IMPRESSION 1. The colon was redundant  2. Mild diverticulosis was noted through the entire examined colon 3. Single 12mm polyp was found in the ascending colon; polypectomy was performed using snare cautery and biopsy forceps 4. Mild diverticulosis was notes in the descending colon and sigmoid colon.  5. Single polyp was found in the sigmoid colon, polypectomy was  performed with cold forceps.  6. Single  polyp was found in the rectosigmoid colon; polypectomy was performed with cold snare  7. Small internal hemorrhoids  8. Large mass was found at the cecum; multiple biopsies of the area were performed using cold forceps; injection (tattooing) was performed distal to the mass.    09/24/2019 Initial Biopsy   INTERPRETATION AND DIAGNOSIS:  A. Cecum, biopsy:  Invasive moderately differentiated adenocarcinoma.  see comment  B. Polyp @ ascending colon, polypectomy:  Tubular Adenoma  C. Polyp @ sigmoid colon Polypectomy:  hyperplastic polyp.  D. Polyp @ rectosigmoid colon, Polypectomy:  Hyperplastic Polyp      10/16/2019 Imaging   CT Chest IMPRESSION: 1. Multiple small pulmonary nodules measuring 5 mm or less in size in the lungs. These are nonspecific and are typically considered statistically likely benign. However, given the patient's history of primary malignancy, close attention on follow-up studies is recommended to ensure stability. 2. Aortic atherosclerosis, in addition to right coronary artery disease. Assessment for potential risk factor modification, dietary therapy or pharmacologic therapy may be warranted, if clinically indicated. 3. There are calcifications of the aortic valve and mitral annulus. Echocardiographic correlation for evaluation of potential valvular dysfunction may be warranted if clinically indicated. 4. Small hiatal hernia.   Aortic Atherosclerosis (ICD10-I70.0).   11/01/2019 Initial Diagnosis   Colon cancer (HCC)   11/01/2019 Surgery   LAPAROSCOPIC PARTIAL COLECTOMY by Dr Andy Bannister and Dr Hershell Lose   11/01/2019 Pathology Results   FINAL MICROSCOPIC DIAGNOSIS:   A. COLON, PROXIMAL RIGHT, COLECTOMY:  - Invasive colonic adenocarcinoma, 5 cm.  - Tumor invades through the muscularis propria into pericolonic tissues.   - Margins of resection are not involved.  - Metastatic carcinoma in (5) of (13) lymph nodes.  - See oncology table.    MSI Stable  Mismatch  repair normal  MLH1 - Preserved nuclear expression (greater 50% tumor expression) MSH2 - Preserved nuclear expression (greater 50% tumor expression) MSH6 - Preserved nuclear expression (greater 50% tumor expression) PMS2 - Preserved nuclear expression (greater 50% tumor expression)   11/01/2019 Cancer Staging   Staging form: Colon and Rectum, AJCC 8th Edition - Pathologic stage from 11/01/2019: Stage IIIB (pT3, pN2a, cM0) - Signed by Sonja Belvoir, MD on 11/29/2019   12/10/2019 Procedure   PAC placed 12/10/19   12/17/2019 - 06/01/2020 Chemotherapy   FOLFOX q2weeks starting in 2 weeks starting 12/17/19. Held 01/27/20-02/10/20 due to b/l PE. Oxaliplatin  held C11-12 due to neuropathy. Completed on 06/01/20   03/02/2021 Imaging   CT CAP  IMPRESSION: 1. No findings of active/recurrent malignancy. Partial right hemicolectomy. 2. Endometrial stripe remains mildly thickened, but endometrial biopsy in May was negative for malignancy. 3. Progressive endplate sclerosis and endplate irregularity at T2-3, probably due to degenerative endplate findings. If the has referable upper thoracic pain/symptoms then thoracic spine MRI could be used for further workup. 4. Other imaging findings of potential clinical significance: Mild cardiomegaly. Aortic Atherosclerosis (ICD10-I70.0). Mild mitral valve calcification. Postoperative findings in the left breast with adjacent radiation port anteriorly in the left upper lobe. Tiny pulmonary nodules in the left lower lobe are unchanged from earliest available comparison of 10/17/2019 and probably benign although may merit surveillance. Multilevel lumbar impingement. Mild pelvic floor laxity.   08/26/2021 Imaging   EXAM: CT CHEST, ABDOMEN, AND PELVIS WITH CONTRAST  IMPRESSION: 1. Stable examination without new or progressive findings to suggest local recurrence or metastatic disease within the chest, abdomen, or pelvis. 2. Hepatomegaly with hepatic steatosis. 3. Sigmoid  colonic diverticulosis without findings of acute diverticulitis. 4. Similar prominent endplate sclerosis and irregularity at T2-T3 is most consistent with Modic type endplate degenerative changes. However, if patient has referable upper thoracic pain consider further workup with thoracic spine MRI. 5. Similar thickening of the endometrial stripe measuring up to 8 mm, which was previously biopsied with results negative for malignancy. 6. Aortic Atherosclerosis (ICD10-I70.0).   11/10/2021 PET scan   IMPRESSION: 1. LEFT ovary is increased in size and is intensely hypermetabolic. While physiologic hypermetabolic ovarian tissue is not uncommon, the enlargement and asymmetric activity warrants further evaluation. Consider contrast pelvic MRI vs tissue sampling. 2. No evidence of metastatic colorectal carcinoma otherwise. 3. Post RIGHT hemicolectomy anatomy. 4. Evidence of radiation change in the LEFT upper lobe (remote breast cancer).     12/22/2021 Relapse/Recurrence    FINAL MICROSCOPIC DIAGNOSIS:   A. LEFT OVARY AND FALLOPIAN TUBE, SALPINGO OOPHORECTOMY:  - Metastatic moderately differentiated colonic adenocarcinoma involving left ovary  - Focal ovarian stromal calcification  - Segment of benign fallopian tube   B. UTERUS WITH RIGHT FALLOPIAN TUBE AND OVARY, HYSTERECTOMY AND SALPINGO-OOPHORECTOMY:  - Metastatic moderately differentiated colonic adenocarcinoma involving right ovary  - Focal invasive extensive adenomyosis  - Benign endometrial polyps  - Benign proliferative phase endometrium  - Hydrosalpinx of right fallopian tube  - Focal ovarian stromal calcification   C. PERITONEAL DEPOSITS, ANTERIOR CUL DE SAC, BIOPSY:  - Metastatic mucinous adenocarcinoma, consistent with colorectal primary    COMMENT:  Immunohistochemical stains show that the tumor cells are positive for CK20 and CDX2 while they are negative for CK7 and PAX8, consistent with above interpretation.     01/24/2022 - 03/23/2022 Chemotherapy   Patient is on Treatment Plan : COLORECTAL FOLFOX q14d x 3 months     04/01/2022 Imaging   CT AP IMPRESSION: 1. New omental metastatic disease. 2. Vague hypoattenuating lesion in the inferior right hepatic lobe is new and also worrisome for metastatic disease. 3. Similar small right lower lobe nodules. Recommend continued attention on follow-up. 4. Steatotic enlarged liver. 5.  Aortic atherosclerosis (ICD10-I70.0).   04/06/2022 -  Chemotherapy   Patient is on Treatment Plan : COLORECTAL FOLFIRI + Bevacizumab  q14d     04/21/2022 Imaging    IMPRESSION: 1. Stable chest CT. No evidence of metastatic disease. 2. Stable small pulmonary nodules, considered benign based on stability. 3. Stable postsurgical changes in the left breast and anterior left upper lobe radiation changes. 4. Aortic Atherosclerosis (ICD10-I70.0) and Emphysema (ICD10-J43.9).   04/21/2022 Imaging    IMPRESSION: 1. Small enhancing lesion inferiorly in the right hepatic lobe is typical of metastatic disease and stable from recent abdominal CT. No other definite liver lesions are identified. Mild hepatic steatosis. 2. No other significant abdominal findings. 3. Omental nodularity seen on CT is not well visualized by MRI.    Imaging     06/23/2022 Imaging    IMPRESSION: 1. Hypodense lesion of the inferior right lobe of the liver, hepatic segment VI is diminished in size, consistent with treatment response. 2. Tiny peritoneal and omental nodules identified by prior examination are diminished in size, consistent with treatment response. 3. Multiple tiny pulmonary nodules unchanged, most likely benign and incidental, however continued attention on follow-up warranted in the setting of known metastatic disease. 4. No evidence of new metastatic disease in the chest, abdomen, or pelvis. 5. Status post partial right hemicolectomy and reanastomosis. 6. Diffuse mosaic attenuation  of the airspaces, consistent with small airways disease. 7. Hepatomegaly.  Discussed the use of AI scribe software for clinical note transcription with the patient, who gave verbal consent to proceed.  History of Present Illness Kim Castro is a 78 year old female with metastatic colon cancer who presents for follow-up.  Her appetite is good, and her weight remains stable between 212 to 210 pounds. She maintains activity at home, including yard work and visiting family. She experiences difficulty rising after prolonged sitting.  She has been on her current chemotherapy regimen since December 2023 and tolerates it well, with no new pain or discomfort related to her cancer. She expresses a desire to eliminate the cancer.  She experiences chronic back pain, likely due to arthritis, but it does not require injections or surgery. Mild anemia and a slightly elevated white blood cell count are stable. Occasional swelling has increased in the past two to three weeks but is not severe. She experiences some cough and shortness of breath, attributed to hay fever.     All other systems were reviewed with the patient and are negative.  MEDICAL HISTORY:  Past Medical History:  Diagnosis Date   Aortic atherosclerosis (HCC)    Arthritis    feet, lower back   Basal cell carcinoma    arm   Breast cancer of upper-outer quadrant of left female breast (HCC) 06/04/2014   Cataract    immature on the left   Colon cancer (HCC) 08/2019   Diabetes mellitus without complication (HCC)    Diverticulosis    Dizziness    > 20yrs ago;took Antivert    Family history of anesthesia complication    sister slow to wake up with anesthesia   Family history of breast cancer    Family history of colon cancer    Family history of uterine cancer    GERD (gastroesophageal reflux disease)    takes occasional TUMs   History of bronchitis    > 5yrs ago   History of colon polyps    History of hiatal hernia     Small noted on CT   History of pulmonary embolus (PE)    Hypertension    takes Losartan  daily and HCTZ   Iron deficiency anemia    Joint pain    Metastatic disease (HCC) 2023   peritoneum and liver mets   Numbness    to toes on each foot   Peripheral neuropathy    feet and toes   Personal history of radiation therapy    Pulmonary nodules    Noted on CT   Radiation 07/31/14-08/28/14   Left Breast 20 fxs   Seasonal allergies    takes Claritin prn   Urinary frequency    Vitamin D deficiency    takes VIt D daily    SURGICAL HISTORY: Past Surgical History:  Procedure Laterality Date   BREAST BIOPSY Bilateral    BREAST LUMPECTOMY Left    BREAST LUMPECTOMY WITH RADIOACTIVE SEED LOCALIZATION Left 07/01/2014   Procedure: LEFT BREAST LUMPECTOMY WITH RADIOACTIVE SEED LOCALIZATION;  Surgeon: Ayesha Lente, MD;  Location: [facility];  Service: General;  Laterality: Left;   CATARACT EXTRACTION Right    COLONOSCOPY  09/24/2019   [place]   LAPAROSCOPIC PARTIAL COLECTOMY N/A 11/01/2019   Procedure: LAPAROSCOPIC PARTIAL COLECTOMY;  Surgeon: [name], MD;  Location: [facility];  Service: General;  Laterality: N/A;   POLYPECTOMY     PORTACATH PLACEMENT N/A 12/10/2019   Procedure: INSERTION PORT-A-CATH ULTRASOUND GUIDED IN RIGHT IJ;  Surgeon: [name], MD;  Location: [facility];  Service: General;  Laterality: N/A;   ROBOTIC ASSISTED TOTAL HYSTERECTOMY Bilateral 12/22/2021   Procedure: XI ROBOTIC ASSISTED TOTAL HYSTERECTOMY WITH BILATERAL SALPINGO OOPHORECTOMY;  Surgeon: Suzi Essex, MD;  Location: [facility];  Service: Gynecology;  Laterality: Bilateral;   TOTAL KNEE ARTHROPLASTY Left 10/24/2012   Procedure: TOTAL KNEE ARTHROPLASTY;  Surgeon: Ilean Mall, MD;  Location: [facility];  Service: Orthopedics;  Laterality: Left;   TOTAL KNEE ARTHROPLASTY Right 01/09/2013   Procedure: TOTAL KNEE ARTHROPLASTY;  Surgeon: Ilean Mall, MD;  Location: [facility];  Service:  Orthopedics;  Laterality: Right;   TUBAL LIGATION      I have reviewed the social history and family history with the patient and they are unchanged from previous note.  ALLERGIES:  is allergic to oxycodone .  MEDICATIONS:  Current Outpatient Medications  Medication Sig Dispense Refill   apixaban  (ELIQUIS ) 5 MG TABS tablet Take 1 tablet (5 mg total) by mouth 2 (two) times daily. 60 tablet 5   b complex vitamins capsule Take 1 capsule by mouth daily.     Cholecalciferol (VITAMIN D) 2000 UNITS CAPS Take 2,000 Units by mouth daily.      ibuprofen (ADVIL) 200 MG tablet Take 200 mg by mouth daily as needed (pain).     lidocaine -prilocaine  (EMLA ) cream Apply 1 Application topically as needed. 30 g 1   loratadine (CLARITIN) 10 MG tablet Take 10 mg by mouth daily as needed for allergies.     losartan  (COZAAR ) 25 MG tablet Take 1 tablet (25 mg total) by mouth daily. 90 tablet 3   metFORMIN  (GLUCOPHAGE ) 500 MG tablet Take 1 tablet (500 mg total) by mouth in the morning and at bedtime. 180 tablet 3   Multiple Vitamins-Minerals (MULTIVITAMIN WITH MINERALS) tablet Take 1 tablet by mouth daily.     omeprazole (PRILOSEC) 20 MG capsule Take 20 mg by mouth daily before breakfast.      ondansetron  (ZOFRAN ) 8 MG tablet Take 1 tablet (8 mg total) by mouth every 8 (eight) hours as needed for nausea or vomiting. Start on the third day after chemotherapy. 30 tablet 1   potassium chloride  (KLOR-CON ) 10 MEQ tablet Take 1 tablet (10 mEq total) by mouth 2 (two) times daily. 60 tablet 2   pravastatin  (PRAVACHOL ) 10 MG tablet Take 1 tablet (10 mg total) by mouth daily. 90 tablet 3   pregabalin  (LYRICA ) 25 MG capsule Take 1 capsule (25 mg total) by mouth at bedtime. In addition to 50mg  at bedtime (for total of 75 mg at bedtime) 30 capsule 1   pregabalin  (LYRICA ) 50 MG capsule Take 1 capsule (50 mg total) by mouth 2 (two) times daily. Take additional 25 mg at bedtime (for total of 75,mg at bedtime). 60 capsule 2    prochlorperazine  (COMPAZINE ) 10 MG tablet Take 1 tablet (10 mg total) by mouth every 6 (six) hours as needed for nausea or vomiting. 30 tablet 1   traMADol  (ULTRAM ) 50 MG tablet Take 1 tablet (50 mg total) by mouth every 6 (six) hours as needed. 30 tablet 0   triamcinolone  ointment (KENALOG ) 0.5 % Apply 1 Application topically 2 (two) times daily. 30 g 0   No current facility-administered medications for this visit.    PHYSICAL EXAMINATION: ECOG PERFORMANCE STATUS: 1 - Symptomatic but completely ambulatory  Vitals:   09/04/23 0906  BP: 138/76  Pulse: 73  Resp: 20  Temp: 97.6 F (36.4 C)  SpO2: 95%   Wt Readings from Last  3 Encounters:  09/04/23 212 lb 6.4 oz (96.3 kg)  08/21/23 209 lb 3.2 oz (94.9 kg)  08/08/23 210 lb 11.2 oz (95.6 kg)     GENERAL:alert, no distress and comfortable SKIN: skin color, texture, turgor are normal, no rashes or significant lesions EYES: normal, Conjunctiva are pink and non-injected, sclera clear NECK: supple, thyroid normal size, non-tender, without nodularity LYMPH:  no palpable lymphadenopathy in the cervical, axillary  LUNGS: clear to auscultation and percussion with normal breathing effort HEART: regular rate & rhythm and no murmurs and no lower extremity edema ABDOMEN:abdomen soft, non-tender and normal bowel sounds Musculoskeletal:no cyanosis of digits and no clubbing  NEURO: alert & oriented x 3 with fluent speech, no focal motor/sensory deficits  Physical Exam MEASUREMENTS: Weight- 212. CHEST: Lungs clear to auscultation bilaterally.  LABORATORY DATA:  I have reviewed the data as listed    Latest Ref Rng & Units 09/04/2023    8:44 AM 08/21/2023    8:26 AM 08/08/2023    8:30 AM  CBC  WBC 4.0 - 10.5 K/uL 13.4  8.8  15.0   Hemoglobin 12.0 - 15.0 g/dL 95.2  84.1  32.4   Hematocrit 36.0 - 46.0 % 32.4  32.1  33.8   Platelets 150 - 400 K/uL 181  167  192         Latest Ref Rng & Units 08/21/2023    8:26 AM 08/08/2023    8:30 AM 07/24/2023     8:26 AM  CMP  Glucose 70 - 99 mg/dL 401  027  253   BUN 8 - 23 mg/dL 18  21  18    Creatinine 0.44 - 1.00 mg/dL 6.64  4.03  4.74   Sodium 135 - 145 mmol/L 144  142  142   Potassium 3.5 - 5.1 mmol/L 4.1  3.9  3.9   Chloride 98 - 111 mmol/L 112  111  111   CO2 22 - 32 mmol/L 26  24  25    Calcium  8.9 - 10.3 mg/dL 9.0  9.0  9.2   Total Protein 6.5 - 8.1 g/dL 6.2  6.5  6.3   Total Bilirubin 0.0 - 1.2 mg/dL 0.7  0.6  0.7   Alkaline Phos 38 - 126 U/L 146  165  140   AST 15 - 41 U/L 24  28  20    ALT 0 - 44 U/L 16  22  14        RADIOGRAPHIC STUDIES: I have personally reviewed the radiological images as listed and agreed with the findings in the report. No results found.    No orders of the defined types were placed in this encounter.  All questions were answered. The patient knows to call the clinic with any problems, questions or concerns. No barriers to learning was detected. The total time spent in the appointment was 25 minutes.     Sonja Kenesaw, MD 09/04/2023

## 2023-09-06 ENCOUNTER — Encounter: Payer: Self-pay | Admitting: Hematology

## 2023-09-06 ENCOUNTER — Inpatient Hospital Stay

## 2023-09-06 VITALS — BP 137/57 | HR 55 | Temp 98.6°F | Resp 18

## 2023-09-06 DIAGNOSIS — Z853 Personal history of malignant neoplasm of breast: Secondary | ICD-10-CM | POA: Diagnosis not present

## 2023-09-06 DIAGNOSIS — C182 Malignant neoplasm of ascending colon: Secondary | ICD-10-CM | POA: Diagnosis not present

## 2023-09-06 DIAGNOSIS — C786 Secondary malignant neoplasm of retroperitoneum and peritoneum: Secondary | ICD-10-CM | POA: Diagnosis not present

## 2023-09-06 DIAGNOSIS — C7963 Secondary malignant neoplasm of bilateral ovaries: Secondary | ICD-10-CM | POA: Diagnosis not present

## 2023-09-06 DIAGNOSIS — G8929 Other chronic pain: Secondary | ICD-10-CM | POA: Diagnosis not present

## 2023-09-06 DIAGNOSIS — C787 Secondary malignant neoplasm of liver and intrahepatic bile duct: Secondary | ICD-10-CM | POA: Diagnosis not present

## 2023-09-06 DIAGNOSIS — M549 Dorsalgia, unspecified: Secondary | ICD-10-CM | POA: Diagnosis not present

## 2023-09-06 DIAGNOSIS — Z5111 Encounter for antineoplastic chemotherapy: Secondary | ICD-10-CM | POA: Diagnosis not present

## 2023-09-06 DIAGNOSIS — Z95828 Presence of other vascular implants and grafts: Secondary | ICD-10-CM

## 2023-09-06 DIAGNOSIS — D649 Anemia, unspecified: Secondary | ICD-10-CM | POA: Diagnosis not present

## 2023-09-06 DIAGNOSIS — Z5189 Encounter for other specified aftercare: Secondary | ICD-10-CM | POA: Diagnosis not present

## 2023-09-06 MED ORDER — PEGFILGRASTIM-CBQV 6 MG/0.6ML ~~LOC~~ SOSY
6.0000 mg | PREFILLED_SYRINGE | Freq: Once | SUBCUTANEOUS | Status: AC
  Administered 2023-09-06: 6 mg via SUBCUTANEOUS
  Filled 2023-09-06: qty 0.6

## 2023-09-06 MED ORDER — HEPARIN SOD (PORK) LOCK FLUSH 100 UNIT/ML IV SOLN
500.0000 [IU] | Freq: Once | INTRAVENOUS | Status: AC | PRN
  Administered 2023-09-06: 500 [IU]

## 2023-09-06 MED ORDER — SODIUM CHLORIDE 0.9% FLUSH
10.0000 mL | INTRAVENOUS | Status: DC | PRN
  Administered 2023-09-06: 10 mL

## 2023-09-11 DIAGNOSIS — K080 Exfoliation of teeth due to systemic causes: Secondary | ICD-10-CM | POA: Diagnosis not present

## 2023-09-12 ENCOUNTER — Ambulatory Visit: Payer: Medicare Other | Admitting: Family Medicine

## 2023-09-12 ENCOUNTER — Ambulatory Visit: Payer: Self-pay | Admitting: Family Medicine

## 2023-09-12 ENCOUNTER — Encounter: Payer: Self-pay | Admitting: Family Medicine

## 2023-09-12 ENCOUNTER — Ambulatory Visit (HOSPITAL_COMMUNITY)
Admission: RE | Admit: 2023-09-12 | Discharge: 2023-09-12 | Disposition: A | Discharge status: Home / Self Care | Source: Ambulatory Visit | Attending: Nurse Practitioner | Admitting: Nurse Practitioner

## 2023-09-12 VITALS — BP 122/68 | HR 74 | Temp 97.0°F | Ht 65.5 in | Wt 205.4 lb

## 2023-09-12 DIAGNOSIS — C182 Malignant neoplasm of ascending colon: Secondary | ICD-10-CM | POA: Diagnosis not present

## 2023-09-12 DIAGNOSIS — E785 Hyperlipidemia, unspecified: Secondary | ICD-10-CM

## 2023-09-12 DIAGNOSIS — I10 Essential (primary) hypertension: Secondary | ICD-10-CM

## 2023-09-12 DIAGNOSIS — E7849 Other hyperlipidemia: Secondary | ICD-10-CM

## 2023-09-12 DIAGNOSIS — E1169 Type 2 diabetes mellitus with other specified complication: Secondary | ICD-10-CM | POA: Diagnosis not present

## 2023-09-12 DIAGNOSIS — Z7984 Long term (current) use of oral hypoglycemic drugs: Secondary | ICD-10-CM

## 2023-09-12 LAB — HEMOGLOBIN A1C: Hgb A1c MFr Bld: 6.1 % (ref 4.6–6.5)

## 2023-09-12 LAB — GLUCOSE, RANDOM: Glucose, Bld: 107 mg/dL — ABNORMAL HIGH (ref 70–99)

## 2023-09-12 LAB — MICROALBUMIN / CREATININE URINE RATIO
Creatinine,U: 99.5 mg/dL
Microalb Creat Ratio: 10.1 mg/g (ref 0.0–30.0)
Microalb, Ur: 1 mg/dL (ref 0.0–1.9)

## 2023-09-12 MED ORDER — IOHEXOL 9 MG/ML PO SOLN
1000.0000 mL | ORAL | Status: AC
  Administered 2023-09-12: 1000 mL via ORAL

## 2023-09-12 MED ORDER — SODIUM CHLORIDE (PF) 0.9 % IJ SOLN
INTRAMUSCULAR | Status: AC
  Filled 2023-09-12: qty 50

## 2023-09-12 MED ORDER — IOHEXOL 9 MG/ML PO SOLN
ORAL | Status: AC
  Filled 2023-09-12: qty 1000

## 2023-09-12 MED ORDER — IOHEXOL 300 MG/ML  SOLN
100.0000 mL | Freq: Once | INTRAMUSCULAR | Status: AC | PRN
  Administered 2023-09-12: 100 mL via INTRAVENOUS

## 2023-09-12 NOTE — Assessment & Plan Note (Signed)
 At goal. Continue pravastatin  10 mg daily.

## 2023-09-12 NOTE — Assessment & Plan Note (Signed)
 Will check her A1c and try again to collect a urine for her micral. Continue metformin  500 mg bid.

## 2023-09-12 NOTE — Assessment & Plan Note (Signed)
 Continue chemotherapy and following with oncology.

## 2023-09-12 NOTE — Assessment & Plan Note (Signed)
 Blood pressure is in good control. Continue losartan 25 mg daily.

## 2023-09-12 NOTE — Progress Notes (Signed)
 [facility] [address] [place] [address] Dept: [phone] Dept Fax: [phone]  Chronic Care Office Visit  Subjective:    Patient ID: Kim Castro, female    DOB: 08/02/1945, 78 y.o..   MRN: [id number]  Chief Complaint  Patient presents with   Hypertension    3 month f/u HTN/DM.  No concerns.   Fasting today.    History of Present Illness:  Patient is in today for reassessment of chronic medical issues.  Kim Castro has a history of type 2 diabetes. She is managed on metformin  500 mg bid.    Kim Castro has a history of hypertension. She is managed on losartan  25 mg daily.   Kim Castro has a history of hyperlipidemia. She is currently on pravastatin  10 mg daily.   Kim Castro has a history of both breast and colon cancer. Earlier in 2023, Kim Castro was found to have metastatic colon cancer throughout the peritoneal cavity, esp. involving both ovaries. She underwent a total hysterectomy. She currently has a Port-a-cath in place and is receiving chemotherapy. She is receiving erythritol and bevacizumab . She has a CT scan planned for later today.  Past Medical History: Patient Active Problem List   Diagnosis Date Noted   Acute right-sided low back pain without sciatica 05/30/2023   Elevated alkaline phosphatase level 05/30/2023   Pulmonary embolism (HCC) 10/02/2022   Macrocytic anemia 10/02/2022   Leukocytosis 10/02/2022   Metastasis to peritoneal cavity (HCC) 01/12/2022   Nausea without vomiting 12/23/2021   Thickened endometrium    Essential hypertension 05/12/2021   Vitamin D deficiency 05/12/2021   Personal history of malignant neoplasm of breast 05/12/2021   History of colonic polyps 05/12/2021   Morbid obesity (HCC) 05/12/2021   Hyperlipidemia 05/12/2021   Hypercalcemia 05/12/2021   Allergic rhinitis 05/12/2021   Knee joint replacement status, bilateral 05/12/2021   Peripheral neuropathy due to  chemotherapy (HCC) 05/12/2021   Aortic atherosclerosis (HCC) 05/12/2021   Gastroesophageal reflux disease    Type 2 diabetes mellitus with hyperlipidemia (HCC)    History of pulmonary embolism 01/27/2020   Port-A-Cath in place 12/30/2019   Malignant neoplasm of ascending colon (HCC) 11/01/2019   Genetic testing 07/08/2014   Malignant neoplasm of female breast (HCC) 06/04/2014   Past Surgical History:  Procedure Laterality Date   BREAST BIOPSY Bilateral    BREAST LUMPECTOMY Left    BREAST LUMPECTOMY WITH RADIOACTIVE SEED LOCALIZATION Left 07/01/2014   Procedure: LEFT BREAST LUMPECTOMY WITH RADIOACTIVE SEED LOCALIZATION;  Surgeon: Ayesha Lente, MD;  Location: [facility];  Service: General;  Laterality: Left;   CATARACT EXTRACTION Right    COLONOSCOPY  09/24/2019   [place]   LAPAROSCOPIC PARTIAL COLECTOMY N/A 11/01/2019   Procedure: LAPAROSCOPIC PARTIAL COLECTOMY;  Surgeon: [name], MD;  Location: [facility];  Service: General;  Laterality: N/A;   POLYPECTOMY     PORTACATH PLACEMENT N/A 12/10/2019   Procedure: INSERTION PORT-A-CATH ULTRASOUND GUIDED IN RIGHT IJ;  Surgeon: [name], MD;  Location: [facility];  Service: General;  Laterality: N/A;   ROBOTIC ASSISTED TOTAL HYSTERECTOMY Bilateral 12/22/2021   Procedure: XI ROBOTIC ASSISTED TOTAL HYSTERECTOMY WITH BILATERAL SALPINGO OOPHORECTOMY;  Surgeon: Suzi Essex, MD;  Location: [facility];  Service: Gynecology;  Laterality: Bilateral;   TOTAL KNEE ARTHROPLASTY Left 10/24/2012   Procedure: TOTAL KNEE ARTHROPLASTY;  Surgeon: Ilean Mall, MD;  Location: [facility];  Service: Orthopedics;  Laterality: Left;   TOTAL KNEE ARTHROPLASTY Right 01/09/2013  Procedure: TOTAL KNEE ARTHROPLASTY;  Surgeon: Ilean Mall, MD;  Location: [facility];  Service: Orthopedics;  Laterality: Right;   TUBAL LIGATION     Family History  Problem Relation Age of Onset   Diabetes Mother    Uterine cancer Mother        deceased 13    Hypertension Father    Heart disease Father    Diabetes Sister    Breast cancer Sister 61       currently 75   Hypertension Sister    Diabetes Sister    Breast cancer Sister    Diabetes Sister    Kidney disease Sister    Heart disease Sister    Lupus Sister    Heart disease Sister    Diabetes Sister    Breast cancer Sister    Diabetes Sister    Breast cancer Daughter    Colon polyps Daughter    Thyroid cancer Daughter 22       currently 91; type?   Cancer Maternal Uncle        unk. primary; deceased 46s   Stroke Maternal Grandmother    Hypertension Brother    Diabetes Brother    Colon cancer Brother 90   Esophageal cancer Neg Hx    Rectal cancer Neg Hx    Stomach cancer Neg Hx    Outpatient Medications Prior to Visit  Medication Sig Dispense Refill   apixaban  (ELIQUIS ) 5 MG TABS tablet Take 1 tablet (5 mg total) by mouth 2 (two) times daily. 60 tablet 5   b complex vitamins capsule Take 1 capsule by mouth daily.     Cholecalciferol (VITAMIN D) 2000 UNITS CAPS Take 2,000 Units by mouth daily.      ibuprofen (ADVIL) 200 MG tablet Take 200 mg by mouth daily as needed (pain).     lidocaine -prilocaine  (EMLA ) cream Apply 1 Application topically as needed. 30 g 1   loratadine (CLARITIN) 10 MG tablet Take 10 mg by mouth daily as needed for allergies.     losartan  (COZAAR ) 25 MG tablet Take 1 tablet (25 mg total) by mouth daily. 90 tablet 3   metFORMIN  (GLUCOPHAGE ) 500 MG tablet Take 1 tablet (500 mg total) by mouth in the morning and at bedtime. 180 tablet 3   Multiple Vitamins-Minerals (MULTIVITAMIN WITH MINERALS) tablet Take 1 tablet by mouth daily.     omeprazole (PRILOSEC) 20 MG capsule Take 20 mg by mouth daily before breakfast.      ondansetron  (ZOFRAN ) 8 MG tablet Take 1 tablet (8 mg total) by mouth every 8 (eight) hours as needed for nausea or vomiting. Start on the third day after chemotherapy. 30 tablet 1   potassium chloride  (KLOR-CON ) 10 MEQ tablet Take 1 tablet (10 mEq  total) by mouth 2 (two) times daily. 60 tablet 2   pravastatin  (PRAVACHOL ) 10 MG tablet Take 1 tablet (10 mg total) by mouth daily. 90 tablet 3   pregabalin  (LYRICA ) 25 MG capsule Take 1 capsule (25 mg total) by mouth at bedtime. In addition to 50mg  at bedtime (for total of 75 mg at bedtime) 30 capsule 1   pregabalin  (LYRICA ) 50 MG capsule Take 1 capsule (50 mg total) by mouth 2 (two) times daily. Take additional 25 mg at bedtime (for total of 75,mg at bedtime). 60 capsule 2   prochlorperazine  (COMPAZINE ) 10 MG tablet Take 1 tablet (10 mg total) by mouth every 6 (six) hours as needed for nausea or vomiting. 30 tablet 1  traMADol  (ULTRAM ) 50 MG tablet Take 1 tablet (50 mg total) by mouth every 6 (six) hours as needed. 30 tablet 0   triamcinolone  ointment (KENALOG ) 0.5 % Apply 1 Application topically 2 (two) times daily. 30 g 0   No facility-administered medications prior to visit.   Allergies  Allergen Reactions   Oxycodone  Nausea And Vomiting   Objective:   Today's Vitals   09/12/23 0828  BP: 122/68  Pulse: 74  Temp: (!) 97 F (36.1 C)  TempSrc: Temporal  SpO2: 98%  Weight: 205 lb 6.4 oz (93.2 kg)  Height: 5' 5.5" (1.664 m)   Body mass index is 33.66 kg/m.   General: Well developed, well nourished. No acute distress. Psych: Alert and oriented. Normal mood and affect.  Health Maintenance Due  Topic Date Due   Diabetic kidney evaluation - Urine ACR  05/13/2023   Medicare Annual Wellness (AWV)  11/04/2023     Assessment & Plan:   Problem List Items Addressed This Visit       Cardiovascular and Mediastinum   Essential hypertension - Primary   Blood pressure is in good control. Continue losartan  25 mg daily.        Digestive   Malignant neoplasm of ascending colon (HCC)   Continue chemotherapy and following with oncology.        Endocrine   Type 2 diabetes mellitus with hyperlipidemia (HCC)   Will check her A1c and try again to collect a urine for her micral.  Continue metformin  500 mg bid.      Relevant Orders   Glucose, random   Hemoglobin A1c   Microalbumin / creatinine urine ratio     Other   Hyperlipidemia   At goal. Continue pravastatin  10 mg daily.      Return in about 3 months (around 12/13/2023) for Reassessment.   [name], MD

## 2023-09-13 ENCOUNTER — Other Ambulatory Visit: Payer: Self-pay

## 2023-09-17 ENCOUNTER — Other Ambulatory Visit: Payer: Self-pay | Admitting: Hematology

## 2023-09-17 NOTE — Assessment & Plan Note (Addendum)
 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 Path showed overall Stage IIIB cancer.  -s/p 6 months adjuvant FOLFOX, completed 06/01/20, oxaliplatin  held for final 2 cycles due to neuropathy.  -s/p BSO and hysterectomy on 12/22/21 with Dr. Orvil Bland. Path revealed metastatic moderately differentiated colonic adenocarcinoma involving both ovaries and peritoneum -she restarted FOLFOX on 01/24/22. -Due to disease progression, chemotherapy changed to second line FOLFIRI and bevacizumab  on April 06, 2022. She has been tolerating moderately well, better now with dose reduction, will continue -Restaging CT scan from 09/27/2022 showed continous response in liver and peritoneal metastasis, no other new lesions.   -We discussed maintenance therapy option in future  -due to her PE in 10/2022, will hold beva for 3 months  -restaging CT 12/31/2022 showed stable disease and live met is not well defined on CT. She restarted beva on 01/17/23  -restaging CT 06/19/2023 showed overall stable disease (stable small lung nodules, no other evidence of cancer) Restaging CT 09/12/2023 showed no residual radiographic evidence of disease.   - We discussed option of maintenance therapy, plan to change to capecitabine  and bevacizumab 

## 2023-09-18 ENCOUNTER — Encounter: Payer: Self-pay | Admitting: Hematology

## 2023-09-18 ENCOUNTER — Inpatient Hospital Stay

## 2023-09-18 ENCOUNTER — Other Ambulatory Visit (HOSPITAL_COMMUNITY): Payer: Self-pay

## 2023-09-18 ENCOUNTER — Telehealth: Payer: Self-pay | Admitting: Pharmacist

## 2023-09-18 ENCOUNTER — Inpatient Hospital Stay: Admitting: Hematology

## 2023-09-18 ENCOUNTER — Telehealth: Payer: Self-pay | Admitting: Pharmacy Technician

## 2023-09-18 VITALS — BP 125/53 | HR 70 | Temp 97.3°F | Resp 18 | Wt 205.7 lb

## 2023-09-18 DIAGNOSIS — C182 Malignant neoplasm of ascending colon: Secondary | ICD-10-CM

## 2023-09-18 DIAGNOSIS — M549 Dorsalgia, unspecified: Secondary | ICD-10-CM | POA: Diagnosis not present

## 2023-09-18 DIAGNOSIS — C786 Secondary malignant neoplasm of retroperitoneum and peritoneum: Secondary | ICD-10-CM | POA: Diagnosis not present

## 2023-09-18 DIAGNOSIS — G8929 Other chronic pain: Secondary | ICD-10-CM | POA: Diagnosis not present

## 2023-09-18 DIAGNOSIS — Z5111 Encounter for antineoplastic chemotherapy: Secondary | ICD-10-CM | POA: Diagnosis not present

## 2023-09-18 DIAGNOSIS — C787 Secondary malignant neoplasm of liver and intrahepatic bile duct: Secondary | ICD-10-CM | POA: Diagnosis not present

## 2023-09-18 DIAGNOSIS — Z95828 Presence of other vascular implants and grafts: Secondary | ICD-10-CM

## 2023-09-18 DIAGNOSIS — Z5189 Encounter for other specified aftercare: Secondary | ICD-10-CM | POA: Diagnosis not present

## 2023-09-18 DIAGNOSIS — C7963 Secondary malignant neoplasm of bilateral ovaries: Secondary | ICD-10-CM | POA: Diagnosis not present

## 2023-09-18 DIAGNOSIS — Z853 Personal history of malignant neoplasm of breast: Secondary | ICD-10-CM | POA: Diagnosis not present

## 2023-09-18 DIAGNOSIS — D649 Anemia, unspecified: Secondary | ICD-10-CM | POA: Diagnosis not present

## 2023-09-18 LAB — CBC WITH DIFFERENTIAL (CANCER CENTER ONLY)
Abs Immature Granulocytes: 0.99 10*3/uL — ABNORMAL HIGH (ref 0.00–0.07)
Basophils Absolute: 0.1 10*3/uL (ref 0.0–0.1)
Basophils Relative: 1 %
Eosinophils Absolute: 0.2 10*3/uL (ref 0.0–0.5)
Eosinophils Relative: 1 %
HCT: 33.3 % — ABNORMAL LOW (ref 36.0–46.0)
Hemoglobin: 10.6 g/dL — ABNORMAL LOW (ref 12.0–15.0)
Immature Granulocytes: 7 %
Lymphocytes Relative: 23 %
Lymphs Abs: 3.6 10*3/uL (ref 0.7–4.0)
MCH: 32.4 pg (ref 26.0–34.0)
MCHC: 31.8 g/dL (ref 30.0–36.0)
MCV: 101.8 fL — ABNORMAL HIGH (ref 80.0–100.0)
Monocytes Absolute: 1.2 10*3/uL — ABNORMAL HIGH (ref 0.1–1.0)
Monocytes Relative: 8 %
Neutro Abs: 9.3 10*3/uL — ABNORMAL HIGH (ref 1.7–7.7)
Neutrophils Relative %: 60 %
Platelet Count: 200 10*3/uL (ref 150–400)
RBC: 3.27 MIL/uL — ABNORMAL LOW (ref 3.87–5.11)
RDW: 18 % — ABNORMAL HIGH (ref 11.5–15.5)
WBC Count: 15.3 10*3/uL — ABNORMAL HIGH (ref 4.0–10.5)
nRBC: 0.1 % (ref 0.0–0.2)

## 2023-09-18 LAB — CMP (CANCER CENTER ONLY)
ALT: 14 U/L (ref 0–44)
AST: 20 U/L (ref 15–41)
Albumin: 4 g/dL (ref 3.5–5.0)
Alkaline Phosphatase: 141 U/L — ABNORMAL HIGH (ref 38–126)
Anion gap: 7 (ref 5–15)
BUN: 18 mg/dL (ref 8–23)
CO2: 24 mmol/L (ref 22–32)
Calcium: 9.2 mg/dL (ref 8.9–10.3)
Chloride: 111 mmol/L (ref 98–111)
Creatinine: 1 mg/dL (ref 0.44–1.00)
GFR, Estimated: 59 mL/min — ABNORMAL LOW (ref >60)
Glucose, Bld: 133 mg/dL — ABNORMAL HIGH (ref 70–99)
Potassium: 4 mmol/L (ref 3.5–5.1)
Sodium: 142 mmol/L (ref 135–145)
Total Bilirubin: 0.7 mg/dL (ref 0.0–1.2)
Total Protein: 6.4 g/dL — ABNORMAL LOW (ref 6.5–8.1)

## 2023-09-18 LAB — CEA (ACCESS): CEA (CHCC): 8.96 ng/mL — ABNORMAL HIGH (ref 0.00–5.00)

## 2023-09-18 MED ORDER — DEXAMETHASONE SODIUM PHOSPHATE 10 MG/ML IJ SOLN
10.0000 mg | Freq: Once | INTRAMUSCULAR | Status: AC
  Administered 2023-09-18: 10 mg via INTRAVENOUS
  Filled 2023-09-18: qty 1

## 2023-09-18 MED ORDER — CAPECITABINE 500 MG PO TABS: 1000.0000 mg/m2 | ORAL_TABLET | Freq: Two times a day (BID) | ORAL | 1 refills | Status: DC

## 2023-09-18 MED ORDER — HEPARIN SOD (PORK) LOCK FLUSH 100 UNIT/ML IV SOLN
500.0000 [IU] | Freq: Once | INTRAVENOUS | Status: AC | PRN
  Administered 2023-09-18: 500 [IU]

## 2023-09-18 MED ORDER — SODIUM CHLORIDE 0.9% FLUSH
10.0000 mL | INTRAVENOUS | Status: DC | PRN
  Administered 2023-09-18: 10 mL

## 2023-09-18 MED ORDER — SODIUM CHLORIDE 0.9 % IV SOLN
5.0000 mg/kg | Freq: Once | INTRAVENOUS | Status: AC
  Administered 2023-09-18: 500 mg via INTRAVENOUS
  Filled 2023-09-18: qty 4

## 2023-09-18 MED ORDER — SODIUM CHLORIDE 0.9 % IV SOLN: Freq: Once | INTRAVENOUS | Status: AC

## 2023-09-18 MED ORDER — ATROPINE SULFATE 1 MG/ML IV SOLN
0.5000 mg | Freq: Once | INTRAVENOUS | Status: AC | PRN
  Administered 2023-09-18: 0.5 mg via INTRAVENOUS
  Filled 2023-09-18: qty 1

## 2023-09-18 MED ORDER — PALONOSETRON HCL INJECTION 0.25 MG/5ML
0.2500 mg | Freq: Once | INTRAVENOUS | Status: AC
  Administered 2023-09-18: 0.25 mg via INTRAVENOUS
  Filled 2023-09-18: qty 5

## 2023-09-18 MED ORDER — SODIUM CHLORIDE 0.9 % IV SOLN
100.0000 mg/m2 | Freq: Once | INTRAVENOUS | Status: AC
  Administered 2023-09-18: 220 mg via INTRAVENOUS
  Filled 2023-09-18: qty 11

## 2023-09-18 NOTE — Progress Notes (Signed)
 [facility]   Telephone:([phone]) [phone] [phone]   Clinic Follow up Note   Patient Care Team: [name], MD as PCP - General (Family Medicine) [name], MD as Consulting Physician (General Surgery) [name], MD as Consulting Physician (Hematology) [name], MD as Consulting Physician (General Surgery) [name], MD as Consulting Physician (Hematology and Oncology) [facility], [facility]   Date of Service:  09/18/2023  CHIEF COMPLAINT: f/u of metastatic colon cancer  CURRENT THERAPY:  FOLFIRI and bevacizumab   Oncology History   Malignant neoplasm of ascending colon (HCC) pT3N2aM0 stage IIB, MSS, metastatic to b/l ovaries and peritoneum and liver, KRAS mutation G12V (+)  -Initially diagnosed in 08/2019, s/p resection with Dr Andy Bannister on 11/01/19. Path showed overall Stage IIIB cancer.  -s/p 6 months adjuvant FOLFOX, completed 06/01/20, oxaliplatin  held for final 2 cycles due to neuropathy.  -s/p BSO and hysterectomy on 12/22/21 with Dr. Orvil Bland. Path revealed metastatic moderately differentiated colonic adenocarcinoma involving both ovaries and peritoneum -she restarted FOLFOX on 01/24/22. -Due to disease progression, chemotherapy changed to second line FOLFIRI and bevacizumab  on April 06, 2022. She has been tolerating moderately well, better now with dose reduction, will continue -Restaging CT scan from 09/27/2022 showed continous response in liver and peritoneal metastasis, no other new lesions.   -We discussed maintenance therapy option in future  -due to her PE in 10/2022, will hold beva for 3 months  -restaging CT 12/31/2022 showed stable disease and live met is not well defined on CT. She restarted beva on 01/17/23  -restaging CT 06/19/2023 showed overall stable disease (stable small lung nodules, no other evidence of cancer) Restaging CT 09/12/2023 showed no residual radiographic evidence of disease.   - We discussed  option of maintenance therapy, plan to change to capecitabine  and bevacizumab   Assessment & Plan Metastatic colon cancer Metastatic colon cancer with minimal residual disease on current chemotherapy regimen, indicating a good response. She has been on chemotherapy since 2023. Transition to maintenance therapy with oral capecitabine  (Xeloda ) was discussed to avoid infusion pump and improve quality of life. She experiences weight stability with recent weight loss, variable appetite, fatigue, and mild neuropathy. Blood counts are stable with a slightly elevated white count and hemoglobin at 10.6. Risks and benefits of oral chemotherapy versus current regimen were discussed. She prefers oral chemotherapy to avoid infusion pump discomfort. Oral capecitabine  may cause increased skin toxicity. - Administer irinotecan  and bevacizumab  without 5-fu pump today. - Transition to oral capecitabine  (Xeloda ) for maintenance therapy starting next cycle. - Schedule oral chemotherapy as one week on, one week off regimen. - Administer bevacizumab  every three weeks. - Call in capecitabine  prescription to specialty pharmacy for mail order. - Schedule follow-up CT scans every three months to monitor disease status. - Consider blood test for tumor DNA if future scans are favorable.  Chemotherapy-induced peripheral neuropathy Mild tingling in fingers, worsening with movement, and increased sensitivity to heat and cold. Neuropathy is slowly worsening but is being managed by her.  Plan -CT scan reviewed, no radiographic residual disease - Labs reviewed, adequate for treatment, will proceed to chemo irinotecan  and bevacizumab  today and cancel 5-FU pump today - Plan to change her treatment to maintenance capecitabine  1 week on and 1 week off, and bevacizumab  every 3 weeks -f/u in 2 weeks    SUMMARY OF ONCOLOGIC HISTORY: Oncology History Overview Note   Cancer Staging  Malignant neoplasm of female breast [facility]) Staging  form: Breast, AJCC 7th Edition -  Clinical stage from 06/11/2014: Stage 0 (Tis (DCIS), N0, M0) - Unsigned Staged by: Pathologist and managing physician Laterality: Left Estrogen receptor status: Positive Progesterone receptor status: Positive Stage used in treatment planning: Yes National guidelines used in treatment planning: Yes Type of national guideline used in treatment planning: NCCN - Pathologic stage from 07/03/2014: Stage Unknown (Tis (DCIS), NX, cM0) - Signed by Windle Hatch, MD on 07/10/2014 Staged by: Pathologist Laterality: Left Estrogen receptor status: Positive Progesterone receptor status: Positive Stage used in treatment planning: Yes National guidelines used in treatment planning: Yes Type of national guideline used in treatment planning: NCCN Staging comments: Staged on final lumpectomy specimen by Dr. Asa Bjork.  Right colon cancer Staging form: Colon and Rectum, AJCC 8th Edition - Pathologic stage from 11/01/2019: Stage IIIB (pT3, pN2a, cM0) - Signed by Sonja Hoople, MD on 11/29/2019 Stage prefix: Initial diagnosis Histologic grading system: 4 grade system Histologic grade (G): G2 Residual tumor (R): R0 - None Tumor deposits (TD): Absent Perineural invasion (PNI): Absent Microsatellite instability (MSI): Stable KRAS mutation: Unknown NRAS mutation: Unknown BRAF mutation: Unknown     Malignant neoplasm of female breast (HCC)  05/29/2014 Initial Biopsy   Left breast needle core biopsy: Grade 2, DCIS with calcs. ER+ (100%), PR+ (96%).    06/04/2014 Initial Diagnosis   Left breast DCIS with calcifications, ER 100%, PR 96%   06/10/2014 Breast MRI   Left breast: 2.4 x 1.3 x 1.1 cm area of patchy non-mass enhancement upper outer quadrant includes postbiopsy seroma; Right breast: 1.2 cm previously biopsied stable benign fibroadenoma   06/12/2014 Procedure   Genetic counseling/testing: Identified 1 VUS on CHEK2 gene. Remainder of 17 gene panel tested negative and included: ATM,  BARD1, BRCA 1/2, BRIP1, CDH1, CHEK2, EPCAM, MLH1, MSH2, MSH6, NBN, NF1, PALB2, PTEN, RAD50, RAD51C, RAD51D, STK11, and TP53.    07/01/2014 Surgery   Left breast lumpectomy ([facility]): Grade 1, DCIS, spanning 2.3 cm, 1 mm margin, ER 100%, PR 96%   07/31/2014 - 08/28/2014 Radiation Therapy   Adjuvant RT completed Katheryn Pandy). Left breast: Total dose 42.5 Gy over 17 fractions. Left breast boost: Total dose 7.5 Gy over 3 fractions.    09/14/2014 - 01/13/2020 Anti-estrogen oral therapy   Anastrazole 1mg  daily. Planned duration of treatment: 5 years [place]). Completed in 01/2020.    09/25/2014 Survivorship   Survivorship Care Plan given to patient and reviewed with her in person.    03/02/2021 Imaging   CT CAP  IMPRESSION: 1. No findings of active/recurrent malignancy. Partial right hemicolectomy. 2. Endometrial stripe remains mildly thickened, but endometrial biopsy in May was negative for malignancy. 3. Progressive endplate sclerosis and endplate irregularity at T2-3, probably due to degenerative endplate findings. If the has referable upper thoracic pain/symptoms then thoracic spine MRI could be used for further workup. 4. Other imaging findings of potential clinical significance: Mild cardiomegaly. Aortic Atherosclerosis (ICD10-I70.0). Mild mitral valve calcification. Postoperative findings in the left breast with adjacent radiation port anteriorly in the left upper lobe. Tiny pulmonary nodules in the left lower lobe are unchanged from earliest available comparison of 10/17/2019 and probably benign although may merit surveillance. Multilevel lumbar impingement. Mild pelvic floor laxity.   Malignant neoplasm of ascending colon (HCC)  09/19/2019 Imaging   CT AP W contrast 09/19/19  IMPRESSION Fullness in the cecum, cannot exclude a mass. No evidence for metastatic disease is identified.    09/24/2019 Procedure   Colonoscopy by Dr Ardine Beckwith 09/24/19 IMPRESSION 1. The colon was redundant  2. Mild  diverticulosis was  noted through the entire examined colon 3. Single 12mm polyp was found in the ascending colon; polypectomy was performed using snare cautery and biopsy forceps 4. Mild diverticulosis was notes in the descending colon and sigmoid colon.  5. Single polyp was found in the sigmoid colon, polypectomy was performed with cold forceps.  6. Single polyp was found in the rectosigmoid colon; polypectomy was performed with cold snare  7. Small internal hemorrhoids  8. Large mass was found at the cecum; multiple biopsies of the area were performed using cold forceps; injection (tattooing) was performed distal to the mass.    09/24/2019 Initial Biopsy   INTERPRETATION AND DIAGNOSIS:  A. Cecum, biopsy:  Invasive moderately differentiated adenocarcinoma.  see comment  B. Polyp @ ascending colon, polypectomy:  Tubular Adenoma  C. Polyp @ sigmoid colon Polypectomy:  hyperplastic polyp.  D. Polyp @ rectosigmoid colon, Polypectomy:  Hyperplastic Polyp      10/16/2019 Imaging   CT Chest IMPRESSION: 1. Multiple small pulmonary nodules measuring 5 mm or less in size in the lungs. These are nonspecific and are typically considered statistically likely benign. However, given the patient's history of primary malignancy, close attention on follow-up studies is recommended to ensure stability. 2. Aortic atherosclerosis, in addition to right coronary artery disease. Assessment for potential risk factor modification, dietary therapy or pharmacologic therapy may be warranted, if clinically indicated. 3. There are calcifications of the aortic valve and mitral annulus. Echocardiographic correlation for evaluation of potential valvular dysfunction may be warranted if clinically indicated. 4. Small hiatal hernia.   Aortic Atherosclerosis (ICD10-I70.0).   11/01/2019 Initial Diagnosis   Colon cancer (HCC)   11/01/2019 Surgery   LAPAROSCOPIC PARTIAL COLECTOMY by Dr Andy Bannister and Dr Hershell Lose   11/01/2019  Pathology Results   FINAL MICROSCOPIC DIAGNOSIS:   A. COLON, PROXIMAL RIGHT, COLECTOMY:  - Invasive colonic adenocarcinoma, 5 cm.  - Tumor invades through the muscularis propria into pericolonic tissues.   - Margins of resection are not involved.  - Metastatic carcinoma in (5) of (13) lymph nodes.  - See oncology table.    MSI Stable  Mismatch repair normal  MLH1 - Preserved nuclear expression (greater 50% tumor expression) MSH2 - Preserved nuclear expression (greater 50% tumor expression) MSH6 - Preserved nuclear expression (greater 50% tumor expression) PMS2 - Preserved nuclear expression (greater 50% tumor expression)   11/01/2019 Cancer Staging   Staging form: Colon and Rectum, AJCC 8th Edition - Pathologic stage from 11/01/2019: Stage IIIB (pT3, pN2a, cM0) - Signed by Sonja Limon, MD on 11/29/2019   12/10/2019 Procedure   PAC placed 12/10/19   12/17/2019 - 06/01/2020 Chemotherapy   FOLFOX q2weeks starting in 2 weeks starting 12/17/19. Held 01/27/20-02/10/20 due to b/l PE. Oxaliplatin  held C11-12 due to neuropathy. Completed on 06/01/20   03/02/2021 Imaging   CT CAP  IMPRESSION: 1. No findings of active/recurrent malignancy. Partial right hemicolectomy. 2. Endometrial stripe remains mildly thickened, but endometrial biopsy in May was negative for malignancy. 3. Progressive endplate sclerosis and endplate irregularity at T2-3, probably due to degenerative endplate findings. If the has referable upper thoracic pain/symptoms then thoracic spine MRI could be used for further workup. 4. Other imaging findings of potential clinical significance: Mild cardiomegaly. Aortic Atherosclerosis (ICD10-I70.0). Mild mitral valve calcification. Postoperative findings in the left breast with adjacent radiation port anteriorly in the left upper lobe. Tiny pulmonary nodules in the left lower lobe are unchanged from earliest available comparison of 10/17/2019 and probably benign although may merit  surveillance. Multilevel lumbar impingement.  Mild pelvic floor laxity.   08/26/2021 Imaging   EXAM: CT CHEST, ABDOMEN, AND PELVIS WITH CONTRAST  IMPRESSION: 1. Stable examination without new or progressive findings to suggest local recurrence or metastatic disease within the chest, abdomen, or pelvis. 2. Hepatomegaly with hepatic steatosis. 3. Sigmoid colonic diverticulosis without findings of acute diverticulitis. 4. Similar prominent endplate sclerosis and irregularity at T2-T3 is most consistent with Modic type endplate degenerative changes. However, if patient has referable upper thoracic pain consider further workup with thoracic spine MRI. 5. Similar thickening of the endometrial stripe measuring up to 8 mm, which was previously biopsied with results negative for malignancy. 6. Aortic Atherosclerosis (ICD10-I70.0).   11/10/2021 PET scan   IMPRESSION: 1. LEFT ovary is increased in size and is intensely hypermetabolic. While physiologic hypermetabolic ovarian tissue is not uncommon, the enlargement and asymmetric activity warrants further evaluation. Consider contrast pelvic MRI vs tissue sampling. 2. No evidence of metastatic colorectal carcinoma otherwise. 3. Post RIGHT hemicolectomy anatomy. 4. Evidence of radiation change in the LEFT upper lobe (remote breast cancer).     12/22/2021 Relapse/Recurrence    FINAL MICROSCOPIC DIAGNOSIS:   A. LEFT OVARY AND FALLOPIAN TUBE, SALPINGO OOPHORECTOMY:  - Metastatic moderately differentiated colonic adenocarcinoma involving left ovary  - Focal ovarian stromal calcification  - Segment of benign fallopian tube   B. UTERUS WITH RIGHT FALLOPIAN TUBE AND OVARY, HYSTERECTOMY AND SALPINGO-OOPHORECTOMY:  - Metastatic moderately differentiated colonic adenocarcinoma involving right ovary  - Focal invasive extensive adenomyosis  - Benign endometrial polyps  - Benign proliferative phase endometrium  - Hydrosalpinx of right fallopian tube  -  Focal ovarian stromal calcification   C. PERITONEAL DEPOSITS, ANTERIOR CUL DE SAC, BIOPSY:  - Metastatic mucinous adenocarcinoma, consistent with colorectal primary    COMMENT:  Immunohistochemical stains show that the tumor cells are positive for CK20 and CDX2 while they are negative for CK7 and PAX8, consistent with above interpretation.    01/24/2022 - 03/23/2022 Chemotherapy   Patient is on Treatment Plan : COLORECTAL FOLFOX q14d x 3 months     04/01/2022 Imaging   CT AP IMPRESSION: 1. New omental metastatic disease. 2. Vague hypoattenuating lesion in the inferior right hepatic lobe is new and also worrisome for metastatic disease. 3. Similar small right lower lobe nodules. Recommend continued attention on follow-up. 4. Steatotic enlarged liver. 5.  Aortic atherosclerosis (ICD10-I70.0).   04/06/2022 -  Chemotherapy   Patient is on Treatment Plan : COLORECTAL FOLFIRI + Bevacizumab  q14d     04/21/2022 Imaging    IMPRESSION: 1. Stable chest CT. No evidence of metastatic disease. 2. Stable small pulmonary nodules, considered benign based on stability. 3. Stable postsurgical changes in the left breast and anterior left upper lobe radiation changes. 4. Aortic Atherosclerosis (ICD10-I70.0) and Emphysema (ICD10-J43.9).   04/21/2022 Imaging    IMPRESSION: 1. Small enhancing lesion inferiorly in the right hepatic lobe is typical of metastatic disease and stable from recent abdominal CT. No other definite liver lesions are identified. Mild hepatic steatosis. 2. No other significant abdominal findings. 3. Omental nodularity seen on CT is not well visualized by MRI.    Imaging     06/23/2022 Imaging    IMPRESSION: 1. Hypodense lesion of the inferior right lobe of the liver, hepatic segment VI is diminished in size, consistent with treatment response. 2. Tiny peritoneal and omental nodules identified by prior examination are diminished in size, consistent with  treatment response. 3. Multiple tiny pulmonary nodules unchanged, most likely benign and incidental, however continued  attention on follow-up warranted in the setting of known metastatic disease. 4. No evidence of new metastatic disease in the chest, abdomen, or pelvis. 5. Status post partial right hemicolectomy and reanastomosis. 6. Diffuse mosaic attenuation of the airspaces, consistent with small airways disease. 7. Hepatomegaly.      Discussed the use of AI scribe software for clinical note transcription with the patient, who gave verbal consent to proceed.  History of Present Illness Kim Castro is a 78 year old female with metastatic colon cancer who presents for follow-up.  She is undergoing chemotherapy since 2023 and experiences mild tingling neuropathy in her fingers, worsened by movement and sensitivity to temperature changes. Despite this, she maintains her daily activities. Her appetite and energy levels are stable, with slight weight loss noted over the past two weeks.  Bowel movements are stable with occasional diarrhea managed by Imodium. No constipation is present. She experiences intermittent swelling in her right leg, though it is less severe than previously.  Current medications include chemotherapy agents, potassium, and pregabalin . She is scheduled for chemotherapy treatment today. Her sister had metastatic breast cancer with severe side effects from medications, which were later adjusted.     All other systems were reviewed with the patient and are negative.  MEDICAL HISTORY:  Past Medical History:  Diagnosis Date   Aortic atherosclerosis (HCC)    Arthritis    feet, lower back   Basal cell carcinoma    arm   Breast cancer of upper-outer quadrant of left female breast (HCC) 06/04/2014   Cataract    immature on the left   Colon cancer (HCC) 08/2019   Diabetes mellitus without complication (HCC)    Diverticulosis    Dizziness    > 6yrs ago;took  Antivert    Family history of anesthesia complication    sister slow to wake up with anesthesia   Family history of breast cancer    Family history of colon cancer    Family history of uterine cancer    GERD (gastroesophageal reflux disease)    takes occasional TUMs   History of bronchitis    > 65yrs ago   History of colon polyps    History of hiatal hernia    Small noted on CT   History of pulmonary embolus (PE)    Hypertension    takes Losartan  daily and HCTZ   Iron deficiency anemia    Joint pain    Metastatic disease (HCC) 2023   peritoneum and liver mets   Numbness    to toes on each foot   Peripheral neuropathy    feet and toes   Personal history of radiation therapy    Pulmonary nodules    Noted on CT   Radiation 07/31/14-08/28/14   Left Breast 20 fxs   Seasonal allergies    takes Claritin prn   Urinary frequency    Vitamin D deficiency    takes VIt D daily    SURGICAL HISTORY: Past Surgical History:  Procedure Laterality Date   BREAST BIOPSY Bilateral    BREAST LUMPECTOMY Left    BREAST LUMPECTOMY WITH RADIOACTIVE SEED LOCALIZATION Left 07/01/2014   Procedure: LEFT BREAST LUMPECTOMY WITH RADIOACTIVE SEED LOCALIZATION;  Surgeon: Ayesha Lente, MD;  Location: [facility];  Service: General;  Laterality: Left;   CATARACT EXTRACTION Right    COLONOSCOPY  09/24/2019   [place]   LAPAROSCOPIC PARTIAL COLECTOMY N/A 11/01/2019   Procedure: LAPAROSCOPIC PARTIAL COLECTOMY;  Surgeon: [name], MD;  Location: [facility];  Service: General;  Laterality: N/A;   POLYPECTOMY     PORTACATH PLACEMENT N/A 12/10/2019   Procedure: INSERTION PORT-A-CATH ULTRASOUND GUIDED IN RIGHT IJ;  Surgeon: [name], MD;  Location: [facility];  Service: General;  Laterality: N/A;   ROBOTIC ASSISTED TOTAL HYSTERECTOMY Bilateral 12/22/2021   Procedure: XI ROBOTIC ASSISTED TOTAL HYSTERECTOMY WITH BILATERAL SALPINGO OOPHORECTOMY;  Surgeon: Suzi Essex, MD;  Location: [facility];  Service: Gynecology;  Laterality: Bilateral;   TOTAL KNEE ARTHROPLASTY Left 10/24/2012   Procedure: TOTAL KNEE ARTHROPLASTY;  Surgeon: Ilean Mall, MD;  Location: [facility];  Service: Orthopedics;  Laterality: Left;   TOTAL KNEE ARTHROPLASTY Right 01/09/2013   Procedure: TOTAL KNEE ARTHROPLASTY;  Surgeon: Ilean Mall, MD;  Location: [facility];  Service: Orthopedics;  Laterality: Right;   TUBAL LIGATION      I have reviewed the social history and family history with the patient and they are unchanged from previous note.  ALLERGIES:  is allergic to oxycodone .  MEDICATIONS:  Current Outpatient Medications  Medication Sig Dispense Refill   capecitabine  (XELODA ) 500 MG tablet Take 4 tablets (2,000 mg total) by mouth 2 (two) times daily after a meal. 7 days on and 7 days off 56 tablet 1   apixaban  (ELIQUIS ) 5 MG TABS tablet Take 1 tablet (5 mg total) by mouth 2 (two) times daily. 60 tablet 5   b complex vitamins capsule Take 1 capsule by mouth daily.     Cholecalciferol (VITAMIN D) 2000 UNITS CAPS Take 2,000 Units by mouth daily.      ibuprofen (ADVIL) 200 MG tablet Take 200 mg by mouth daily as needed (pain).     lidocaine -prilocaine  (EMLA ) cream Apply 1 Application topically as needed. 30 g 1   loratadine (CLARITIN) 10 MG tablet Take 10 mg by mouth daily as needed for allergies.     losartan  (COZAAR ) 25 MG tablet Take 1 tablet (25 mg total) by mouth daily. 90 tablet 3   metFORMIN  (GLUCOPHAGE ) 500 MG tablet Take 1 tablet (500 mg total) by mouth in the morning and at bedtime. 180 tablet 3   Multiple Vitamins-Minerals (MULTIVITAMIN WITH MINERALS) tablet Take 1 tablet by mouth daily.     omeprazole (PRILOSEC) 20 MG capsule Take 20 mg by mouth daily before breakfast.      ondansetron  (ZOFRAN ) 8 MG tablet Take 1 tablet (8 mg total) by mouth every 8 (eight) hours as needed for nausea or vomiting. Start on the third day after chemotherapy. 30 tablet 1   potassium chloride  (KLOR-CON ) 10 MEQ tablet  Take 1 tablet (10 mEq total) by mouth 2 (two) times daily. 60 tablet 2   pravastatin  (PRAVACHOL ) 10 MG tablet Take 1 tablet (10 mg total) by mouth daily. 90 tablet 3   pregabalin  (LYRICA ) 25 MG capsule Take 1 capsule (25 mg total) by mouth at bedtime. In addition to 50mg  at bedtime (for total of 75 mg at bedtime) 30 capsule 1   pregabalin  (LYRICA ) 50 MG capsule Take 1 capsule (50 mg total) by mouth 2 (two) times daily. Take additional 25 mg at bedtime (for total of 75,mg at bedtime). 60 capsule 2   prochlorperazine  (COMPAZINE ) 10 MG tablet Take 1 tablet (10 mg total) by mouth every 6 (six) hours as needed for nausea or vomiting. 30 tablet 1   traMADol  (ULTRAM ) 50 MG tablet Take 1 tablet (50 mg total) by mouth every 6 (six) hours as needed. 30 tablet 0   triamcinolone   ointment (KENALOG ) 0.5 % Apply 1 Application topically 2 (two) times daily. 30 g 0   No current facility-administered medications for this visit.   Facility-Administered Medications Ordered in Other Visits  Medication Dose Route Frequency Provider Last Rate Last Admin   sodium chloride  flush (NS) 0.9 % injection 10 mL  10 mL Intracatheter PRN Sonja [place], MD   10 mL at 09/18/23 1307    PHYSICAL EXAMINATION: ECOG PERFORMANCE STATUS: 1 - Symptomatic but completely ambulatory  Vitals:   09/18/23 0924  BP: (!) 125/53  Pulse: 70  Resp: 18  Temp: (!) 97.3 F (36.3 C)  SpO2: 100%   Wt Readings from Last 3 Encounters:  09/18/23 205 lb 11.2 oz (93.3 kg)  09/12/23 205 lb 6.4 oz (93.2 kg)  09/04/23 212 lb 6.4 oz (96.3 kg)     GENERAL:alert, no distress and comfortable SKIN: skin color, texture, turgor are normal, no rashes or significant lesions EYES: normal, Conjunctiva are pink and non-injected, sclera clear NECK: supple, thyroid normal size, non-tender, without nodularity LYMPH:  no palpable lymphadenopathy in the cervical, axillary  LUNGS: clear to auscultation and percussion with normal breathing effort HEART: regular rate  & rhythm and no murmurs and no lower extremity edema ABDOMEN:abdomen soft, non-tender and normal bowel sounds Musculoskeletal:no cyanosis of digits and no clubbing  NEURO: alert & oriented x 3 with fluent speech, no focal motor/sensory deficits  Physical Exam    LABORATORY DATA:  I have reviewed the data as listed    Latest Ref Rng & Units 09/18/2023    9:00 AM 09/04/2023    8:44 AM 08/21/2023    8:26 AM  CBC  WBC 4.0 - 10.5 K/uL 15.3  13.4  8.8   Hemoglobin 12.0 - 15.0 g/dL 40.9  81.1  91.4   Hematocrit 36.0 - 46.0 % 33.3  32.4  32.1   Platelets 150 - 400 K/uL 200  181  167         Latest Ref Rng & Units 09/18/2023    9:00 AM 09/12/2023    9:06 AM 09/04/2023    8:44 AM  CMP  Glucose 70 - 99 mg/dL 782  956  213   BUN 8 - 23 mg/dL 18   17   Creatinine 0.86 - 1.00 mg/dL 5.78   4.69   Sodium 629 - 145 mmol/L 142   144   Potassium 3.5 - 5.1 mmol/L 4.0   3.9   Chloride 98 - 111 mmol/L 111   111   CO2 22 - 32 mmol/L 24   26   Calcium  8.9 - 10.3 mg/dL 9.2   8.9   Total Protein 6.5 - 8.1 g/dL 6.4   6.1   Total Bilirubin 0.0 - 1.2 mg/dL 0.7   0.6   Alkaline Phos 38 - 126 U/L 141   158   AST 15 - 41 U/L 20   36   ALT 0 - 44 U/L 14   22       RADIOGRAPHIC STUDIES: I have personally reviewed the radiological images as listed and agreed with the findings in the report. No results found.    No orders of the defined types were placed in this encounter.  All questions were answered. The patient knows to call the clinic with any problems, questions or concerns. No barriers to learning was detected. The total time spent in the appointment was 40 minutes, including review of chart and various tests results, discussions about plan of care and  coordination of care plan     Sonja [place], MD 09/18/2023

## 2023-09-18 NOTE — Telephone Encounter (Signed)
 Oral Oncology Pharmacist Encounter  Received new prescription for Xeloda  (capecitabine ) for the treatment of metastatic colon cancer in conjunction with bevacizumab , planned duration until disease progression or unacceptable drug toxicity.   CBC w/ Diff and CMP from 09/18/23 assessed, no relevant lab abnormalities requiring baseline dose adjustment required at this time. Prescription dose and frequency assessed for appropriateness.  Current medication list in Epic reviewed, DDIs with Xeloda  identified: Category C DDI between Xeloda  and Ondansetron  due to risk of Qtc prolongation with fluorouracil  products. Noted patient only taking PRN and PO route, risk higher with IV administration. No change in therapy warranted at this time.  Category C DDI between Xeloda  and Omeprazole - proton-pump inhibitors can decrease efficacy of Xeloda  - will discuss with patient alternatives to omeprazole, such as H2RA's like famotidine while on Xeloda .  Evaluated chart and no patient barriers to medication adherence noted.   Patient agreement for treatment documented in MD note on 09/18/23.  Prescription has been e-scribed to the [facility] for benefits analysis and approval.  Oral Oncology Clinic will continue to follow for insurance authorization, copayment issues, initial counseling and start date.  Jude Norton, PharmD, BCPS, BCOP Hematology/Oncology Clinical Pharmacist [facility] and [facility] Oral Chemotherapy Navigation Clinics [phone] 09/18/2023 3:52 PM

## 2023-09-18 NOTE — Telephone Encounter (Signed)
 Oral Oncology Patient Advocate Encounter  After completing a benefits investigation, prior authorization for capecitabine  is not required at this time through [facility].  Patient's copay is $21.91.     Roda Cirri, CPhT Specialty Pharmacy Patient Advocate Phone: [phone] Fax: [phone]

## 2023-09-18 NOTE — Patient Instructions (Signed)
 [facility] - A DEPT OF [place]  Discharge Instructions: Thank you for choosing [facility] to provide your oncology and hematology care.   If you have a lab appointment with the Cancer Center, please go directly to the Cancer Center and check in at the registration area.   Wear comfortable clothing and clothing appropriate for easy access to any Portacath or PICC line.   We strive to give you quality time with your provider. You may need to reschedule your appointment if you arrive late (15 or more minutes).  Arriving late affects you and other patients whose appointments are after yours.  Also, if you miss three or more appointments without notifying the office, you may be dismissed from the clinic at the provider's discretion.      For prescription refill requests, have your pharmacy contact our office and allow 72 hours for refills to be completed.    Today you received the following chemotherapy and/or immunotherapy agents: Bevacizumab  & Irinotecan       To help prevent nausea and vomiting after your treatment, we encourage you to take your nausea medication as directed.  BELOW ARE SYMPTOMS THAT SHOULD BE REPORTED IMMEDIATELY: *FEVER GREATER THAN 100.4 F (38 C) OR HIGHER *CHILLS OR SWEATING *NAUSEA AND VOMITING THAT IS NOT CONTROLLED WITH YOUR NAUSEA MEDICATION *UNUSUAL SHORTNESS OF BREATH *UNUSUAL BRUISING OR BLEEDING *URINARY PROBLEMS (pain or burning when urinating, or frequent urination) *BOWEL PROBLEMS (unusual diarrhea, constipation, pain near the anus) TENDERNESS IN MOUTH AND THROAT WITH OR WITHOUT PRESENCE OF ULCERS (sore throat, sores in mouth, or a toothache) UNUSUAL RASH, SWELLING OR PAIN  UNUSUAL VAGINAL DISCHARGE OR ITCHING   Items with * indicate a potential emergency and should be followed up as soon as possible or go to the Emergency Department if any problems should occur.  Please show the CHEMOTHERAPY ALERT CARD or  IMMUNOTHERAPY ALERT CARD at check-in to the Emergency Department and triage nurse.  Should you have questions after your visit or need to cancel or reschedule your appointment, please contact [facility] - A DEPT OF [facility]  Dept: [phone]  and follow the prompts.  Office hours are 8:00 a.m. to 4:30 p.m. Monday - Friday. Please note that voicemails left after 4:00 p.m. may not be returned until the following business day.  We are closed weekends and major holidays. You have access to a nurse at all times for urgent questions. Please call the main number to the clinic Dept: [phone] and follow the prompts.   For any non-urgent questions, you may also contact your provider using MyChart. We now offer e-Visits for anyone 90 and older to request care online for non-urgent symptoms. For details visit [email or web address].   Also download the MyChart app! Go to the app store, search "MyChart", open the app, select [place], and log in with your MyChart username and password.

## 2023-09-20 ENCOUNTER — Encounter

## 2023-09-26 ENCOUNTER — Other Ambulatory Visit: Payer: Self-pay

## 2023-09-26 ENCOUNTER — Other Ambulatory Visit: Payer: Self-pay | Admitting: Hematology

## 2023-09-26 ENCOUNTER — Other Ambulatory Visit: Payer: Self-pay | Admitting: Pharmacy Technician

## 2023-09-26 ENCOUNTER — Other Ambulatory Visit (HOSPITAL_COMMUNITY): Payer: Self-pay

## 2023-09-26 DIAGNOSIS — Z1231 Encounter for screening mammogram for malignant neoplasm of breast: Secondary | ICD-10-CM

## 2023-09-26 MED ORDER — CAPECITABINE 500 MG PO TABS
1000.0000 mg/m2 | ORAL_TABLET | Freq: Two times a day (BID) | ORAL | 1 refills | Status: DC
  Filled 2023-09-26: qty 56, 14d supply, fill #0
  Filled 2023-10-09: qty 56, 14d supply, fill #1

## 2023-09-26 NOTE — Telephone Encounter (Signed)
 Oral Chemotherapy Pharmacist Encounter  I spoke with patient for overview of: Xeloda  (capecitabine ) for the treatment of metastatic colon cancer in conjunction with bevacizumab , planned duration until disease progression or unacceptable drug toxicity.   Counseled patient on administration, dosing, side effects, monitoring, drug-food interactions, safe handling, storage, and disposal.  Patient will take Xeloda  500mg  tablets, 4 tablets (2000mg ) by mouth in AM and 4 tabs (2000mg ) by mouth in PM, within 30 minutes of finishing meals, for 7 days on, 7 days off, repeated every 14 days.  Xeloda  start date: 10/02/23 (patient will pick up Xeloda  prior to MD appt on 10/02/23)  Adverse effects include but are not limited to: fatigue, decreased blood counts, GI upset, diarrhea, and hand-foot syndrome. Hand-foot syndrome: discussed use of cream such as [place] Smooth Extra Care 20 or equivalent advanced care cream that has 20% urea content for advanced skin hydration while on Xeloda . Additionally discussed use of OTC Voltaren gel for HFS prophylaxis. Discussed recommended use of Voltaren gel is 1 finger tip application for front/backside of hands and then 1 fingertip application to bottoms of feet twice a day for up to 12 weeks.  Diarrhea: Patient will obtain Imodium (loperamide) to have on hand if they experience diarrhea. Patient knows to alert the office of 4 or more loose stools above baseline.  Reviewed with patient importance of keeping a medication schedule and plan for any missed doses. No barriers to medication adherence identified.  Medication reconciliation performed and medication/allergy list updated. Discussed with patient she will need to hold PPI (omeprazole) while on Xeloda  due to risk of decreasing efficacy of Xeloda . Patient OK with taking famotidine instead of omeprazole while on Xeloda .   All questions answered.  Kim Castro voiced understanding and appreciation.   Medication education  handout placed in mail for patient. Patient knows to call the office with questions or concerns. Oral Chemotherapy Clinic phone number provided to patient.   Jude Norton, PharmD, BCPS, BCOP Hematology/Oncology Clinical Pharmacist Kim Castro and [facility] Oral Chemotherapy Navigation Clinics [phone] 09/26/2023 12:29 PM

## 2023-09-26 NOTE — Progress Notes (Signed)
 Oral Chemotherapy Pharmacist Encounter  Patient was counseled under telephone encounter from 09/18/23.  Jude Norton, PharmD, BCPS, BCOP Hematology/Oncology Clinical Pharmacist [facility] and [facility] Oral Chemotherapy Navigation Clinics [phone] 09/26/2023 12:39 PM

## 2023-09-26 NOTE — Progress Notes (Signed)
 Specialty Pharmacy Initial Fill Coordination Note  Kim Castro is a 78 y.o. female contacted today regarding refills of specialty medication(s) Capecitabine  (XELODA ) .  Patient requested [place] [facility] at [facility] at [place]  on 10/02/23 Pt needs to pick up first thing in the AM. Please have ready at [facility] on Friday 09/29/23  Medication will be filled on 09/28/23.   Patient is aware of $21.91 copayment.

## 2023-09-28 ENCOUNTER — Other Ambulatory Visit: Payer: Self-pay

## 2023-10-01 ENCOUNTER — Other Ambulatory Visit: Payer: Self-pay | Admitting: Nurse Practitioner

## 2023-10-01 DIAGNOSIS — C182 Malignant neoplasm of ascending colon: Secondary | ICD-10-CM

## 2023-10-01 NOTE — Assessment & Plan Note (Signed)
 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 Path showed overall Stage IIIB cancer.  -s/p 6 months adjuvant FOLFOX, completed 06/01/20, oxaliplatin  held for final 2 cycles due to neuropathy.  -s/p BSO and hysterectomy on 12/22/21 with Dr. Orvil Bland. Path revealed metastatic moderately differentiated colonic adenocarcinoma involving both ovaries and peritoneum -she restarted FOLFOX on 01/24/22. -Due to disease progression, chemotherapy changed to second line FOLFIRI and bevacizumab  on April 06, 2022. She has been tolerating moderately well, better now with dose reduction, will continue -Restaging CT scan from 09/27/2022 showed continous response in liver and peritoneal metastasis, no other new lesions.   -We discussed maintenance therapy option in future  -due to her PE in 10/2022, will hold beva for 3 months  -restaging CT 12/31/2022 showed stable disease and live met is not well defined on CT. She restarted beva on 01/17/23  -restaging CT 06/19/2023 showed overall stable disease (stable small lung nodules, no other evidence of cancer) Restaging CT 09/12/2023 showed no residual radiographic evidence of disease.   - She started maintenance capecitabine  on 09/18/2023, taking 1 week on, 1 week off.  Bevacizumab  to be given every 21 days.  Today is cycle 1 day 1 with bevacizumab  alone with oral capecitabine .

## 2023-10-01 NOTE — Progress Notes (Unsigned)
 Patient Care Team: [name], MD as PCP - General (Family Medicine) [name], MD as Consulting Physician (General Surgery) Sonja Guernsey, MD as Consulting Physician (Hematology) [name], MD as Consulting Physician (General Surgery) [name], MD as Consulting Physician (Hematology and Oncology) [facility], [facility]   Clinic Day:  10/02/2023  Referring physician: Sonja Florence, MD  ASSESSMENT & PLAN:   Assessment & Plan: Malignant neoplasm of ascending colon [facility]) pT3N2aM0 stage IIB, MSS, metastatic to b/l ovaries and peritoneum and liver, KRAS mutation G12V (+)  -Initially diagnosed in 08/2019, s/p resection with Dr Andy Bannister on 11/01/19. Path showed overall Stage IIIB cancer.  -s/p 6 months adjuvant FOLFOX, completed 06/01/20, oxaliplatin  held for final 2 cycles due to neuropathy.  -s/p BSO and hysterectomy on 12/22/21 with Dr. Orvil Bland. Path revealed metastatic moderately differentiated colonic adenocarcinoma involving both ovaries and peritoneum -she restarted FOLFOX on 01/24/22. -Due to disease progression, chemotherapy changed to second line FOLFIRI and bevacizumab  on April 06, 2022. She has been tolerating moderately well, better now with dose reduction, will continue -Restaging CT scan from 09/27/2022 showed continous response in liver and peritoneal metastasis, no other new lesions.   -We discussed maintenance therapy option in future  -due to her PE in 10/2022, will hold beva for 3 months  -restaging CT 12/31/2022 showed stable disease and live met is not well defined on CT. She restarted beva on 01/17/23  -restaging CT 06/19/2023 showed overall stable disease (stable small lung nodules, no other evidence of cancer) Restaging CT 09/12/2023 showed no residual radiographic evidence of disease.   - 10/02/2023 - start maintenance bevacizumab  today. Will be administered every 3 weeks. Starts Capecitabine  tomorrow. She will take 4 tablets 2 times daily for one  week, followed by one week off. Reviewed common side effects of capecitabine , including skin toxicities which are common.    Diarrhea The patient has had more severe diarrhea for about 24 hours earlier this week. Taking both imodium and lomotil  would control for only a few hours, this has resolved and she is back to her baseline bowel movements. Generally has 2 bowel movements in the mornings and then is ok after that.   Anemia Hgb slightly lower than baseline at 9.6 and hct 30.4. normal WBC and ANC. Will continue to monitor routinely and treat as indicated with IV iron or blood transfusion. She does admit to feeling cold most of the time with increased fatigue.   Plan:  Labs reviewed.  -mildly reduced Hgb and hct at 9.6 and 30.4 respectively.  -CMP is stable and unremarkable.  -start with Cycle 1 of bevacizumab  and capecitabine  today.  Labs and patient presentation are adequate for bevacizumab .  -plans to start with capecitabine  tomorrow.  --she understands that she is to take 4 tablets twice daily. She dies this for one week, followed by one week off.  -labs/flush, follow up and Cycle 2 bevacizumab  in 3 weeks.  The patient understands the plans discussed today and is in agreement with them.  She knows to contact our office if she develops concerns prior to her next appointment.  I provided 25 minutes of face-to-face time during this encounter and > 50% was spent counseling as documented under my assessment and plan.    Sharyon Deis, NP  [facility] - A DEPT OF [facility] [address] [place] [address] Dept: [phone] Dept Fax: [phone]   No orders of  the defined types were placed in this encounter.     CHIEF COMPLAINT:  CC: Metastatic colon cancer  Current Treatment: Capecitabine  and bevacizumab   INTERVAL HISTORY:  Kim Castro is here today for repeat clinical assessment.  She was last seen by Dr.  Maryalice Smaller on 09/18/2023.  Today will be first treatment with bevacizumab .  Taking capecitabine  1 week on and 1 week off.  Bevacizumabe to start today. She plans to start capecitabine  tomorrow. She states over the past week, she has had more severe diarrhea than usual. Was having to use the bathroom for bowel movement every 185 to 20 minutes. Was taking OTC imodium and lomotil  to help control the symptoms. Those would only help for a few hours, then symptoms would come right back. She reports improvement since then. Now back to baseline bowel patterns. Denies blood in her stool. Does have intermittent nausea. Denies vomiting. Has noted a little swelling in her feet today. She state this is normal for her when she sits with feet on the floor or dangling under her for longer periods of time.  She denies fevers or chills. She denies pain. Her appetite is good. Her weight has decreased 4 pounds over last 2 weeks.  I have reviewed the past medical history, past surgical history, social history and family history with the patient and they are unchanged from previous note.  ALLERGIES:  is allergic to oxycodone .  MEDICATIONS:  Current Outpatient Medications  Medication Sig Dispense Refill   b complex vitamins capsule Take 1 capsule by mouth daily.     capecitabine  (XELODA ) 500 MG tablet Take 4 tablets (2,000 mg total) by mouth 2 (two) times daily after a meal. Take within 30 minutes after meals. Take for 7 days on and 7 days off, then repeat. 56 tablet 1   Cholecalciferol (VITAMIN D) 2000 UNITS CAPS Take 2,000 Units by mouth daily.      ELIQUIS  5 MG TABS tablet Take 1 tablet by mouth twice daily 60 tablet 0   ibuprofen (ADVIL) 200 MG tablet Take 200 mg by mouth daily as needed (pain).     lidocaine -prilocaine  (EMLA ) cream Apply 1 Application topically as needed. 30 g 1   loratadine (CLARITIN) 10 MG tablet Take 10 mg by mouth daily as needed for allergies.     losartan  (COZAAR ) 25 MG tablet Take 1 tablet (25 mg  total) by mouth daily. 90 tablet 3   metFORMIN  (GLUCOPHAGE ) 500 MG tablet Take 1 tablet (500 mg total) by mouth in the morning and at bedtime. 180 tablet 3   Multiple Vitamins-Minerals (MULTIVITAMIN WITH MINERALS) tablet Take 1 tablet by mouth daily.     omeprazole (PRILOSEC) 20 MG capsule Take 20 mg by mouth daily before breakfast.      ondansetron  (ZOFRAN ) 8 MG tablet Take 1 tablet (8 mg total) by mouth every 8 (eight) hours as needed for nausea or vomiting. Start on the third day after chemotherapy. 30 tablet 1   potassium chloride  (KLOR-CON ) 10 MEQ tablet Take 1 tablet (10 mEq total) by mouth 2 (two) times daily. 60 tablet 2   pravastatin  (PRAVACHOL ) 10 MG tablet Take 1 tablet (10 mg total) by mouth daily. 90 tablet 3   pregabalin  (LYRICA ) 25 MG capsule Take 1 capsule (25 mg total) by mouth at bedtime. In addition to 50mg  at bedtime (for total of 75 mg at bedtime) 30 capsule 1   pregabalin  (LYRICA ) 50 MG capsule Take 1 capsule (50 mg total) by mouth 2 (two) times daily.  Take additional 25 mg at bedtime (for total of 75,mg at bedtime). 60 capsule 2   prochlorperazine  (COMPAZINE ) 10 MG tablet Take 1 tablet (10 mg total) by mouth every 6 (six) hours as needed for nausea or vomiting. 30 tablet 1   traMADol  (ULTRAM ) 50 MG tablet Take 1 tablet (50 mg total) by mouth every 6 (six) hours as needed. 30 tablet 0   triamcinolone  ointment (KENALOG ) 0.5 % Apply 1 Application topically 2 (two) times daily. 30 g 0   No current facility-administered medications for this visit.   Facility-Administered Medications Ordered in Other Visits  Medication Dose Route Frequency Provider Last Rate Last Admin   0.9 %  sodium chloride  infusion   Intravenous Continuous Sonja Laddonia, MD 10 mL/hr at 10/02/23 1013 New Bag at 10/02/23 1013   bevacizumab -awwb (MVASI ) 700 mg in sodium chloride  0.9 % 100 mL chemo infusion  7.5 mg/kg (Treatment Plan Recorded) Intravenous Once Sonja Celada, MD        HISTORY OF PRESENT ILLNESS:    Oncology History Overview Note   Cancer Staging  Malignant neoplasm of female breast [facility]) Staging form: Breast, AJCC 7th Edition - Clinical stage from 06/11/2014: Stage 0 (Tis (DCIS), N0, M0) - Unsigned Staged by: Pathologist and managing physician Laterality: Left Estrogen receptor status: Positive Progesterone receptor status: Positive Stage used in treatment planning: Yes National guidelines used in treatment planning: Yes Type of national guideline used in treatment planning: NCCN - Pathologic stage from 07/03/2014: Stage Unknown (Tis (DCIS), NX, cM0) - Signed by Windle Hatch, MD on 07/10/2014 Staged by: Pathologist Laterality: Left Estrogen receptor status: Positive Progesterone receptor status: Positive Stage used in treatment planning: Yes National guidelines used in treatment planning: Yes Type of national guideline used in treatment planning: NCCN Staging comments: Staged on final lumpectomy specimen by Dr. Asa Bjork.  Right colon cancer Staging form: Colon and Rectum, AJCC 8th Edition - Pathologic stage from 11/01/2019: Stage IIIB (pT3, pN2a, cM0) - Signed by Sonja Los Arcos, MD on 11/29/2019 Stage prefix: Initial diagnosis Histologic grading system: 4 grade system Histologic grade (G): G2 Residual tumor (R): R0 - None Tumor deposits (TD): Absent Perineural invasion (PNI): Absent Microsatellite instability (MSI): Stable KRAS mutation: Unknown NRAS mutation: Unknown BRAF mutation: Unknown     Malignant neoplasm of female breast (HCC)  05/29/2014 Initial Biopsy   Left breast needle core biopsy: Grade 2, DCIS with calcs. ER+ (100%), PR+ (96%).    06/04/2014 Initial Diagnosis   Left breast DCIS with calcifications, ER 100%, PR 96%   06/10/2014 Breast MRI   Left breast: 2.4 x 1.3 x 1.1 cm area of patchy non-mass enhancement upper outer quadrant includes postbiopsy seroma; Right breast: 1.2 cm previously biopsied stable benign fibroadenoma   06/12/2014 Procedure   Genetic  counseling/testing: Identified 1 VUS on CHEK2 gene. Remainder of 17 gene panel tested negative and included: ATM, BARD1, BRCA 1/2, BRIP1, CDH1, CHEK2, EPCAM, MLH1, MSH2, MSH6, NBN, NF1, PALB2, PTEN, RAD50, RAD51C, RAD51D, STK11, and TP53.    07/01/2014 Surgery   Left breast lumpectomy ([facility]): Grade 1, DCIS, spanning 2.3 cm, 1 mm margin, ER 100%, PR 96%   07/31/2014 - 08/28/2014 Radiation Therapy   Adjuvant RT completed Katheryn Pandy). Left breast: Total dose 42.5 Gy over 17 fractions. Left breast boost: Total dose 7.5 Gy over 3 fractions.    09/14/2014 - 01/13/2020 Anti-estrogen oral therapy   Anastrazole 1mg  daily. Planned duration of treatment: 5 years [place]). Completed in 01/2020.    09/25/2014 Survivorship   Survivorship Care  Plan given to patient and reviewed with her in person.    03/02/2021 Imaging   CT CAP  IMPRESSION: 1. No findings of active/recurrent malignancy. Partial right hemicolectomy. 2. Endometrial stripe remains mildly thickened, but endometrial biopsy in May was negative for malignancy. 3. Progressive endplate sclerosis and endplate irregularity at T2-3, probably due to degenerative endplate findings. If the has referable upper thoracic pain/symptoms then thoracic spine MRI could be used for further workup. 4. Other imaging findings of potential clinical significance: Mild cardiomegaly. Aortic Atherosclerosis (ICD10-I70.0). Mild mitral valve calcification. Postoperative findings in the left breast with adjacent radiation port anteriorly in the left upper lobe. Tiny pulmonary nodules in the left lower lobe are unchanged from earliest available comparison of 10/17/2019 and probably benign although may merit surveillance. Multilevel lumbar impingement. Mild pelvic floor laxity.   Malignant neoplasm of ascending colon (HCC)  09/19/2019 Imaging   CT AP W contrast 09/19/19  IMPRESSION Fullness in the cecum, cannot exclude a mass. No evidence for metastatic disease is  identified.    09/24/2019 Procedure   Colonoscopy by Dr Ardine Beckwith 09/24/19 IMPRESSION 1. The colon was redundant  2. Mild diverticulosis was noted through the entire examined colon 3. Single 12mm polyp was found in the ascending colon; polypectomy was performed using snare cautery and biopsy forceps 4. Mild diverticulosis was notes in the descending colon and sigmoid colon.  5. Single polyp was found in the sigmoid colon, polypectomy was performed with cold forceps.  6. Single polyp was found in the rectosigmoid colon; polypectomy was performed with cold snare  7. Small internal hemorrhoids  8. Large mass was found at the cecum; multiple biopsies of the area were performed using cold forceps; injection (tattooing) was performed distal to the mass.    09/24/2019 Initial Biopsy   INTERPRETATION AND DIAGNOSIS:  A. Cecum, biopsy:  Invasive moderately differentiated adenocarcinoma.  see comment  B. Polyp @ ascending colon, polypectomy:  Tubular Adenoma  C. Polyp @ sigmoid colon Polypectomy:  hyperplastic polyp.  D. Polyp @ rectosigmoid colon, Polypectomy:  Hyperplastic Polyp      10/16/2019 Imaging   CT Chest IMPRESSION: 1. Multiple small pulmonary nodules measuring 5 mm or less in size in the lungs. These are nonspecific and are typically considered statistically likely benign. However, given the patient's history of primary malignancy, close attention on follow-up studies is recommended to ensure stability. 2. Aortic atherosclerosis, in addition to right coronary artery disease. Assessment for potential risk factor modification, dietary therapy or pharmacologic therapy may be warranted, if clinically indicated. 3. There are calcifications of the aortic valve and mitral annulus. Echocardiographic correlation for evaluation of potential valvular dysfunction may be warranted if clinically indicated. 4. Small hiatal hernia.   Aortic Atherosclerosis (ICD10-I70.0).   11/01/2019 Initial  Diagnosis   Colon cancer (HCC)   11/01/2019 Surgery   LAPAROSCOPIC PARTIAL COLECTOMY by Dr Andy Bannister and Dr Hershell Lose   11/01/2019 Pathology Results   FINAL MICROSCOPIC DIAGNOSIS:   A. COLON, PROXIMAL RIGHT, COLECTOMY:  - Invasive colonic adenocarcinoma, 5 cm.  - Tumor invades through the muscularis propria into pericolonic tissues.   - Margins of resection are not involved.  - Metastatic carcinoma in (5) of (13) lymph nodes.  - See oncology table.    MSI Stable  Mismatch repair normal  MLH1 - Preserved nuclear expression (greater 50% tumor expression) MSH2 - Preserved nuclear expression (greater 50% tumor expression) MSH6 - Preserved nuclear expression (greater 50% tumor expression) PMS2 - Preserved nuclear expression (greater 50% tumor expression)  11/01/2019 Cancer Staging   Staging form: Colon and Rectum, AJCC 8th Edition - Pathologic stage from 11/01/2019: Stage IIIB (pT3, pN2a, cM0) - Signed by Sonja Martin, MD on 11/29/2019   12/10/2019 Procedure   PAC placed 12/10/19   12/17/2019 - 06/01/2020 Chemotherapy   FOLFOX q2weeks starting in 2 weeks starting 12/17/19. Held 01/27/20-02/10/20 due to b/l PE. Oxaliplatin  held C11-12 due to neuropathy. Completed on 06/01/20   03/02/2021 Imaging   CT CAP  IMPRESSION: 1. No findings of active/recurrent malignancy. Partial right hemicolectomy. 2. Endometrial stripe remains mildly thickened, but endometrial biopsy in May was negative for malignancy. 3. Progressive endplate sclerosis and endplate irregularity at T2-3, probably due to degenerative endplate findings. If the has referable upper thoracic pain/symptoms then thoracic spine MRI could be used for further workup. 4. Other imaging findings of potential clinical significance: Mild cardiomegaly. Aortic Atherosclerosis (ICD10-I70.0). Mild mitral valve calcification. Postoperative findings in the left breast with adjacent radiation port anteriorly in the left upper lobe. Tiny pulmonary nodules in  the left lower lobe are unchanged from earliest available comparison of 10/17/2019 and probably benign although may merit surveillance. Multilevel lumbar impingement. Mild pelvic floor laxity.   08/26/2021 Imaging   EXAM: CT CHEST, ABDOMEN, AND PELVIS WITH CONTRAST  IMPRESSION: 1. Stable examination without new or progressive findings to suggest local recurrence or metastatic disease within the chest, abdomen, or pelvis. 2. Hepatomegaly with hepatic steatosis. 3. Sigmoid colonic diverticulosis without findings of acute diverticulitis. 4. Similar prominent endplate sclerosis and irregularity at T2-T3 is most consistent with Modic type endplate degenerative changes. However, if patient has referable upper thoracic pain consider further workup with thoracic spine MRI. 5. Similar thickening of the endometrial stripe measuring up to 8 mm, which was previously biopsied with results negative for malignancy. 6. Aortic Atherosclerosis (ICD10-I70.0).   11/10/2021 PET scan   IMPRESSION: 1. LEFT ovary is increased in size and is intensely hypermetabolic. While physiologic hypermetabolic ovarian tissue is not uncommon, the enlargement and asymmetric activity warrants further evaluation. Consider contrast pelvic MRI vs tissue sampling. 2. No evidence of metastatic colorectal carcinoma otherwise. 3. Post RIGHT hemicolectomy anatomy. 4. Evidence of radiation change in the LEFT upper lobe (remote breast cancer).     12/22/2021 Relapse/Recurrence    FINAL MICROSCOPIC DIAGNOSIS:   A. LEFT OVARY AND FALLOPIAN TUBE, SALPINGO OOPHORECTOMY:  - Metastatic moderately differentiated colonic adenocarcinoma involving left ovary  - Focal ovarian stromal calcification  - Segment of benign fallopian tube   B. UTERUS WITH RIGHT FALLOPIAN TUBE AND OVARY, HYSTERECTOMY AND SALPINGO-OOPHORECTOMY:  - Metastatic moderately differentiated colonic adenocarcinoma involving right ovary  - Focal invasive extensive  adenomyosis  - Benign endometrial polyps  - Benign proliferative phase endometrium  - Hydrosalpinx of right fallopian tube  - Focal ovarian stromal calcification   C. PERITONEAL DEPOSITS, ANTERIOR CUL DE SAC, BIOPSY:  - Metastatic mucinous adenocarcinoma, consistent with colorectal primary    COMMENT:  Immunohistochemical stains show that the tumor cells are positive for CK20 and CDX2 while they are negative for CK7 and PAX8, consistent with above interpretation.    01/24/2022 - 03/23/2022 Chemotherapy   Patient is on Treatment Plan : COLORECTAL FOLFOX q14d x 3 months     04/01/2022 Imaging   CT AP IMPRESSION: 1. New omental metastatic disease. 2. Vague hypoattenuating lesion in the inferior right hepatic lobe is new and also worrisome for metastatic disease. 3. Similar small right lower lobe nodules. Recommend continued attention on follow-up. 4. Steatotic enlarged liver. 5.  Aortic  atherosclerosis (ICD10-I70.0).   04/06/2022 - 09/18/2023 Chemotherapy   Patient is on Treatment Plan : COLORECTAL FOLFIRI + Bevacizumab  q14d     04/21/2022 Imaging    IMPRESSION: 1. Stable chest CT. No evidence of metastatic disease. 2. Stable small pulmonary nodules, considered benign based on stability. 3. Stable postsurgical changes in the left breast and anterior left upper lobe radiation changes. 4. Aortic Atherosclerosis (ICD10-I70.0) and Emphysema (ICD10-J43.9).   04/21/2022 Imaging    IMPRESSION: 1. Small enhancing lesion inferiorly in the right hepatic lobe is typical of metastatic disease and stable from recent abdominal CT. No other definite liver lesions are identified. Mild hepatic steatosis. 2. No other significant abdominal findings. 3. Omental nodularity seen on CT is not well visualized by MRI.    Imaging     06/23/2022 Imaging    IMPRESSION: 1. Hypodense lesion of the inferior right lobe of the liver, hepatic segment VI is diminished in size, consistent with  treatment response. 2. Tiny peritoneal and omental nodules identified by prior examination are diminished in size, consistent with treatment response. 3. Multiple tiny pulmonary nodules unchanged, most likely benign and incidental, however continued attention on follow-up warranted in the setting of known metastatic disease. 4. No evidence of new metastatic disease in the chest, abdomen, or pelvis. 5. Status post partial right hemicolectomy and reanastomosis. 6. Diffuse mosaic attenuation of the airspaces, consistent with small airways disease. 7. Hepatomegaly.   10/02/2023 -  Chemotherapy   Patient is on Treatment Plan : COLORECTAL Bevacizumab  q21d         REVIEW OF SYSTEMS:   Constitutional: Denies fevers, chills or abnormal weight loss Eyes: Denies blurriness of vision Ears, nose, mouth, throat, and face: Denies mucositis or sore throat Respiratory: Denies cough, dyspnea or wheezes Cardiovascular: Denies palpitation, chest discomfort or lower extremity swelling Gastrointestinal:  Denies nausea, heartburn or change in bowel habits Skin: Denies abnormal skin rashes. She has noted a small skin lesion on the right side of her cheek. Initially felt tender and bruised. Now getting better. No long tender. Feels and appears scabbed.  Lymphatics: Denies new lymphadenopathy or easy bruising Neurological:Denies numbness, tingling or new weaknesses Behavioral/Psych: Mood is stable, no new changes  All other systems were reviewed with the patient and are negative.   VITALS:   Today's Vitals   10/02/23 0907 10/02/23 0956  BP: 138/68   Pulse: (!) 54   Resp: 17   Temp: 97.8 F (36.6 C)   SpO2: 99%   Weight: 201 lb 14.4 oz (91.6 kg)   PainSc:  3    Body mass index is 33.09 kg/m.   Wt Readings from Last 3 Encounters:  10/02/23 201 lb 14.4 oz (91.6 kg)  09/18/23 205 lb 11.2 oz (93.3 kg)  09/12/23 205 lb 6.4 oz (93.2 kg)    Body mass index is 33.09 kg/m.  Performance status  (ECOG): 1 - Symptomatic but completely ambulatory  PHYSICAL EXAM:   GENERAL:alert, no distress and comfortable SKIN: skin color, texture, turgor are normal, no rashes or significant lesions. Small, rough lesion on the right side of the nose, on cheek. Appears to be scabbed with rough texture. < 5mm in diameter.  EYES: normal, Conjunctiva are pink and non-injected, sclera clear OROPHARYNX:no exudate, no erythema and lips, buccal mucosa, and tongue normal  NECK: supple, thyroid normal size, non-tender, without nodularity LYMPH:  no palpable lymphadenopathy in the cervical, axillary or inguinal LUNGS: clear to auscultation and percussion with normal breathing effort HEART: regular rate &  rhythm and no murmurs and no lower extremity edema ABDOMEN:abdomen soft, non-tender and normal bowel sounds Musculoskeletal:no cyanosis of digits and no clubbing  NEURO: alert & oriented x 3 with fluent speech, no focal motor/sensory deficits  LABORATORY DATA:  I have reviewed the data as listed    Component Value Date/Time   NA 142 10/02/2023 0839   NA 144 10/03/2014 0914   K 4.3 10/02/2023 0839   K 4.6 10/03/2014 0914   CL 115 (H) 10/02/2023 0839   CO2 21 (L) 10/02/2023 0839   CO2 29 10/03/2014 0914   GLUCOSE 96 10/02/2023 0839   GLUCOSE 133 10/03/2014 0914   BUN 21 10/02/2023 0839   BUN 17.8 10/03/2014 0914   CREATININE 0.87 10/02/2023 0839   CREATININE 0.8 10/03/2014 0914   CALCIUM  8.9 10/02/2023 0839   CALCIUM  9.7 10/03/2014 0914   PROT 6.4 (L) 10/02/2023 0839   PROT 7.0 10/03/2014 0914   ALBUMIN  3.8 10/02/2023 0839   ALBUMIN  3.7 10/03/2014 0914   AST 20 10/02/2023 0839   AST 18 10/03/2014 0914   ALT 14 10/02/2023 0839   ALT 13 10/03/2014 0914   ALKPHOS 140 (H) 10/02/2023 0839   ALKPHOS 74 10/03/2014 0914   BILITOT 0.5 10/02/2023 0839   BILITOT 0.98 10/03/2014 0914   GFRNONAA >60 10/02/2023 0839   GFRAA >60 01/30/2020 0429   GFRAA >60 01/27/2020 0834   Lab Results  Component  Value Date   WBC 4.2 10/02/2023   NEUTROABS 1.8 10/02/2023   HGB 9.6 (L) 10/02/2023   HCT 30.4 (L) 10/02/2023   MCV 101.3 (H) 10/02/2023   PLT 186 10/02/2023    RADIOGRAPHIC STUDIES: CT CHEST ABDOMEN PELVIS W CONTRAST Result Date: 09/12/2023 EXAMINATION: CT CHEST ABDOMEN PELVIS W CONTRAST CLINICAL INDICATION: Female, 78 years old. Colon cancer, assess treatment response TECHNIQUE: Axial CT of the chest, abdomen, and pelvis with 100 mL Omnipaque  intravenous contrast. Multiplanar reformations provided. Unless otherwise specified, incidental thyroid, adrenal, renal lesions do not require dedicated imaging follow up. Additionally, any mentioned pulmonary nodules do not require dedicated imaging follow-up based on the Fleischner guidelines unless otherwise specified. Coronary calcifications are not identified unless otherwise specified. COMPARISON: 06/19/2023 FINDINGS: CHEST: Right chest wall Mediport catheter tip terminates in the SVC. The visualized thyroid is normal. The thoracic aorta is nonaneurysmal. Scattered atherosclerotic changes are present. The main pulmonary artery is normal in caliber. The heart is normal in size. There is a small hiatal hernia. There is no free fluid or pathologic adenopathy by size criteria. There postoperative changes from partial left mastectomy. The trachea and mainstem bronchi are patent. Postradiation changes are noted anteriorly in the left upper lobe. Similar 4 mm left lower lobe pulmonary nodule noted (series 2 image 33). There are other similar smaller stable left lower lobe pulmonary nodules. ABDOMEN/PELVIS: The liver appears normal. The gallbladder is normal. The spleen is normal. The pancreas is normal. The adrenals are normal. The right kidney contains a cyst. The left kidney demonstrates peripelvic cysts. Abdominal aorta is normal in caliber. Scattered atherosclerotic changes are present. Urinary bladder is normal. The uterus is surgically absent. Large and small  bowel loops are otherwise within normal limits other than partial right colectomy. There is no free fluid or pathologic lymphadenopathy by size criteria. BONES: No concerning osseous lesions. There are degenerative changes of the spine and bony pelvis. IMPRESSION: Status post partial colectomy with no convincing evidence for malignancy within the chest, abdomen, or pelvis. Stable small left lower lobe pulmonary nodules. Attention  on follow-up. DOSE REDUCTION: All CT scans are performed using radiation dose reduction techniques, when applicable. Technical factors are evaluated and adjusted to ensure appropriate moderation of exposure. Electronically signed by: [place] Engel MD 09/12/2023 07:38 PM EDT RP Workstation: [id number]

## 2023-10-02 ENCOUNTER — Inpatient Hospital Stay

## 2023-10-02 ENCOUNTER — Encounter: Payer: Self-pay | Admitting: Nurse Practitioner

## 2023-10-02 ENCOUNTER — Inpatient Hospital Stay: Attending: Hematology

## 2023-10-02 ENCOUNTER — Inpatient Hospital Stay: Admitting: Nurse Practitioner

## 2023-10-02 VITALS — BP 138/68 | HR 54 | Temp 97.8°F | Resp 17 | Wt 201.9 lb

## 2023-10-02 DIAGNOSIS — C182 Malignant neoplasm of ascending colon: Secondary | ICD-10-CM

## 2023-10-02 DIAGNOSIS — Z923 Personal history of irradiation: Secondary | ICD-10-CM | POA: Diagnosis not present

## 2023-10-02 DIAGNOSIS — C7963 Secondary malignant neoplasm of bilateral ovaries: Secondary | ICD-10-CM | POA: Insufficient documentation

## 2023-10-02 DIAGNOSIS — L271 Localized skin eruption due to drugs and medicaments taken internally: Secondary | ICD-10-CM | POA: Diagnosis not present

## 2023-10-02 DIAGNOSIS — Z95828 Presence of other vascular implants and grafts: Secondary | ICD-10-CM

## 2023-10-02 DIAGNOSIS — D649 Anemia, unspecified: Secondary | ICD-10-CM | POA: Diagnosis not present

## 2023-10-02 DIAGNOSIS — C786 Secondary malignant neoplasm of retroperitoneum and peritoneum: Secondary | ICD-10-CM | POA: Diagnosis not present

## 2023-10-02 DIAGNOSIS — Z86000 Personal history of in-situ neoplasm of breast: Secondary | ICD-10-CM | POA: Diagnosis not present

## 2023-10-02 DIAGNOSIS — Z800 Family history of malignant neoplasm of digestive organs: Secondary | ICD-10-CM | POA: Insufficient documentation

## 2023-10-02 DIAGNOSIS — R197 Diarrhea, unspecified: Secondary | ICD-10-CM | POA: Diagnosis not present

## 2023-10-02 DIAGNOSIS — Z803 Family history of malignant neoplasm of breast: Secondary | ICD-10-CM | POA: Insufficient documentation

## 2023-10-02 DIAGNOSIS — Z5111 Encounter for antineoplastic chemotherapy: Secondary | ICD-10-CM | POA: Diagnosis not present

## 2023-10-02 DIAGNOSIS — Z8049 Family history of malignant neoplasm of other genital organs: Secondary | ICD-10-CM | POA: Insufficient documentation

## 2023-10-02 DIAGNOSIS — C787 Secondary malignant neoplasm of liver and intrahepatic bile duct: Secondary | ICD-10-CM | POA: Diagnosis not present

## 2023-10-02 LAB — CBC WITH DIFFERENTIAL (CANCER CENTER ONLY)
Abs Immature Granulocytes: 0.03 10*3/uL (ref 0.00–0.07)
Basophils Absolute: 0 10*3/uL (ref 0.0–0.1)
Basophils Relative: 1 %
Eosinophils Absolute: 0.2 10*3/uL (ref 0.0–0.5)
Eosinophils Relative: 4 %
HCT: 30.4 % — ABNORMAL LOW (ref 36.0–46.0)
Hemoglobin: 9.6 g/dL — ABNORMAL LOW (ref 12.0–15.0)
Immature Granulocytes: 1 %
Lymphocytes Relative: 39 %
Lymphs Abs: 1.6 10*3/uL (ref 0.7–4.0)
MCH: 32 pg (ref 26.0–34.0)
MCHC: 31.6 g/dL (ref 30.0–36.0)
MCV: 101.3 fL — ABNORMAL HIGH (ref 80.0–100.0)
Monocytes Absolute: 0.6 10*3/uL (ref 0.1–1.0)
Monocytes Relative: 13 %
Neutro Abs: 1.8 10*3/uL (ref 1.7–7.7)
Neutrophils Relative %: 42 %
Platelet Count: 186 10*3/uL (ref 150–400)
RBC: 3 MIL/uL — ABNORMAL LOW (ref 3.87–5.11)
RDW: 16.4 % — ABNORMAL HIGH (ref 11.5–15.5)
WBC Count: 4.2 10*3/uL (ref 4.0–10.5)
nRBC: 0 % (ref 0.0–0.2)

## 2023-10-02 LAB — CMP (CANCER CENTER ONLY)
ALT: 14 U/L (ref 0–44)
AST: 20 U/L (ref 15–41)
Albumin: 3.8 g/dL (ref 3.5–5.0)
Alkaline Phosphatase: 140 U/L — ABNORMAL HIGH (ref 38–126)
Anion gap: 6 (ref 5–15)
BUN: 21 mg/dL (ref 8–23)
CO2: 21 mmol/L — ABNORMAL LOW (ref 22–32)
Calcium: 8.9 mg/dL (ref 8.9–10.3)
Chloride: 115 mmol/L — ABNORMAL HIGH (ref 98–111)
Creatinine: 0.87 mg/dL (ref 0.44–1.00)
GFR, Estimated: >60 mL/min (ref >60)
Glucose, Bld: 96 mg/dL (ref 70–99)
Potassium: 4.3 mmol/L (ref 3.5–5.1)
Sodium: 142 mmol/L (ref 135–145)
Total Bilirubin: 0.5 mg/dL (ref 0.0–1.2)
Total Protein: 6.4 g/dL — ABNORMAL LOW (ref 6.5–8.1)

## 2023-10-02 LAB — TOTAL PROTEIN, URINE DIPSTICK: Protein, ur: NEGATIVE mg/dL

## 2023-10-02 LAB — CEA (ACCESS): CEA (CHCC): 8 ng/mL — ABNORMAL HIGH (ref 0.00–5.00)

## 2023-10-02 MED ORDER — SODIUM CHLORIDE 0.9 % IV SOLN
7.5000 mg/kg | Freq: Once | INTRAVENOUS | Status: AC
  Administered 2023-10-02: 700 mg via INTRAVENOUS
  Filled 2023-10-02: qty 12

## 2023-10-02 MED ORDER — SODIUM CHLORIDE 0.9 % IV SOLN: INTRAVENOUS | Status: DC

## 2023-10-02 MED ORDER — SODIUM CHLORIDE 0.9% FLUSH
10.0000 mL | INTRAVENOUS | Status: DC | PRN
Start: 2023-10-02 — End: 2023-10-02
  Administered 2023-10-02: 10 mL

## 2023-10-02 NOTE — Patient Instructions (Signed)
 [facility] - A DEPT OF [facility]  Discharge Instructions: Thank you for choosing [facility] to provide your oncology and hematology care.   If you have a lab appointment with the Cancer Center, please go directly to the Cancer Center and check in at the registration area.   Wear comfortable clothing and clothing appropriate for easy access to any Portacath or PICC line.   We strive to give you quality time with your provider. You may need to reschedule your appointment if you arrive late (15 or more minutes).  Arriving late affects you and other patients whose appointments are after yours.  Also, if you miss three or more appointments without notifying the office, you may be dismissed from the clinic at the provider's discretion.      For prescription refill requests, have your pharmacy contact our office and allow 72 hours for refills to be completed.    Today you received the following chemotherapy and/or immunotherapy agents: Bevacizumab      To help prevent nausea and vomiting after your treatment, we encourage you to take your nausea medication as directed.  BELOW ARE SYMPTOMS THAT SHOULD BE REPORTED IMMEDIATELY: *FEVER GREATER THAN 100.4 F (38 C) OR HIGHER *CHILLS OR SWEATING *NAUSEA AND VOMITING THAT IS NOT CONTROLLED WITH YOUR NAUSEA MEDICATION *UNUSUAL SHORTNESS OF BREATH *UNUSUAL BRUISING OR BLEEDING *URINARY PROBLEMS (pain or burning when urinating, or frequent urination) *BOWEL PROBLEMS (unusual diarrhea, constipation, pain near the anus) TENDERNESS IN MOUTH AND THROAT WITH OR WITHOUT PRESENCE OF ULCERS (sore throat, sores in mouth, or a toothache) UNUSUAL RASH, SWELLING OR PAIN  UNUSUAL VAGINAL DISCHARGE OR ITCHING   Items with * indicate a potential emergency and should be followed up as soon as possible or go to the Emergency Department if any problems should occur.  Please show the CHEMOTHERAPY ALERT CARD or  IMMUNOTHERAPY ALERT CARD at check-in to the Emergency Department and triage nurse.  Should you have questions after your visit or need to cancel or reschedule your appointment, please contact [facility]  Dept: [phone]  and follow the prompts.  Office hours are 8:00 a.m. to 4:30 p.m. Monday - Friday. Please note that voicemails left after 4:00 p.m. may not be returned until the following business day.  We are closed weekends and major holidays. You have access to a nurse at all times for urgent questions. Please call the main number to the clinic Dept: [phone] and follow the prompts.   For any non-urgent questions, you may also contact your provider using MyChart. We now offer e-Visits for anyone 67 and older to request care online for non-urgent symptoms. For details visit [email or web address].   Also download the MyChart app! Go to the app store, search "MyChart", open the app, select [place], and log in with your MyChart username and password.

## 2023-10-04 ENCOUNTER — Encounter

## 2023-10-09 ENCOUNTER — Other Ambulatory Visit: Payer: Self-pay

## 2023-10-09 ENCOUNTER — Encounter (INDEPENDENT_AMBULATORY_CARE_PROVIDER_SITE_OTHER): Payer: Self-pay

## 2023-10-09 NOTE — Progress Notes (Signed)
 Specialty Pharmacy Refill Coordination Note  Kim Castro is a 78 y.o. female contacted today regarding refills of specialty medication(s) Capecitabine  (XELODA )   Patient requested (Patient-Rptd) Pickup at [facility] at [facility] date: (Patient-Rptd) 10/12/23   Medication will be filled on 10/11/2023.

## 2023-10-10 ENCOUNTER — Other Ambulatory Visit: Payer: Self-pay

## 2023-10-18 ENCOUNTER — Other Ambulatory Visit: Payer: Self-pay

## 2023-10-18 NOTE — Progress Notes (Signed)
 Specialty Pharmacy Ongoing Clinical Assessment Note  Kim Castro is a 78 y.o. female who is being followed by the specialty pharmacy service for RxSp Oncology   Patient's specialty medication(s) reviewed today: Capecitabine  (XELODA )   Missed doses in the last 4 weeks: 0   Patient/Caregiver did not have any additional questions or concerns.   Therapeutic benefit summary: Unable to assess   Adverse events/side effects summary: Experienced adverse events/side effects (tingling in hands - trying voltaren gel; occasional diarrhea - controlled with imodium)   Patient's therapy is appropriate to: Continue    Goals Addressed             This Visit's Progress    Slow Disease Progression   No change    Patient is unable to be assessed as therapy was recently initiated. Patient will maintain adherence.         Follow up: 3 months  [facility]

## 2023-10-22 NOTE — Assessment & Plan Note (Signed)
 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 Path showed overall Stage IIIB cancer.  -s/p 6 months adjuvant FOLFOX, completed 06/01/20, oxaliplatin  held for final 2 cycles due to neuropathy.  -s/p BSO and hysterectomy on 12/22/21 with Dr. Viktoria. Path revealed metastatic moderately differentiated colonic adenocarcinoma involving both ovaries and peritoneum -she restarted FOLFOX on 01/24/22. -Due to disease progression, chemotherapy changed to second line FOLFIRI and bevacizumab  on April 06, 2022. She has been tolerating moderately well, better now with dose reduction, will continue -Restaging CT scan from 09/27/2022 showed continous response in liver and peritoneal metastasis, no other new lesions.   -We discussed maintenance therapy option in future  -due to her PE in 10/2022, will hold beva for 3 months  -restaging CT 12/31/2022 showed stable disease and live met is not well defined on CT. She restarted beva on 01/17/23  -restaging CT 06/19/2023 showed overall stable disease (stable small lung nodules, no other evidence of cancer) Restaging CT 09/12/2023 showed no residual radiographic evidence of disease.   - We discussed option of maintenance therapy, treatment changed to capecitabine  and bevacizumab  on 10/02/2023

## 2023-10-23 ENCOUNTER — Other Ambulatory Visit: Payer: Self-pay

## 2023-10-23 ENCOUNTER — Inpatient Hospital Stay

## 2023-10-23 ENCOUNTER — Other Ambulatory Visit: Payer: Self-pay | Admitting: Hematology

## 2023-10-23 ENCOUNTER — Inpatient Hospital Stay: Admitting: Hematology

## 2023-10-23 ENCOUNTER — Encounter (INDEPENDENT_AMBULATORY_CARE_PROVIDER_SITE_OTHER): Payer: Self-pay

## 2023-10-23 ENCOUNTER — Encounter: Payer: Self-pay | Admitting: Hematology

## 2023-10-23 VITALS — BP 120/56 | HR 75 | Temp 98.4°F | Resp 17 | Ht 65.5 in | Wt 204.7 lb

## 2023-10-23 VITALS — BP 131/46 | HR 59 | Temp 97.6°F | Resp 17

## 2023-10-23 DIAGNOSIS — Z86000 Personal history of in-situ neoplasm of breast: Secondary | ICD-10-CM | POA: Diagnosis not present

## 2023-10-23 DIAGNOSIS — C182 Malignant neoplasm of ascending colon: Secondary | ICD-10-CM

## 2023-10-23 DIAGNOSIS — Z8049 Family history of malignant neoplasm of other genital organs: Secondary | ICD-10-CM | POA: Diagnosis not present

## 2023-10-23 DIAGNOSIS — Z923 Personal history of irradiation: Secondary | ICD-10-CM | POA: Diagnosis not present

## 2023-10-23 DIAGNOSIS — C7963 Secondary malignant neoplasm of bilateral ovaries: Secondary | ICD-10-CM | POA: Diagnosis not present

## 2023-10-23 DIAGNOSIS — C786 Secondary malignant neoplasm of retroperitoneum and peritoneum: Secondary | ICD-10-CM | POA: Diagnosis not present

## 2023-10-23 DIAGNOSIS — C787 Secondary malignant neoplasm of liver and intrahepatic bile duct: Secondary | ICD-10-CM | POA: Diagnosis not present

## 2023-10-23 DIAGNOSIS — D649 Anemia, unspecified: Secondary | ICD-10-CM | POA: Diagnosis not present

## 2023-10-23 DIAGNOSIS — Z803 Family history of malignant neoplasm of breast: Secondary | ICD-10-CM | POA: Diagnosis not present

## 2023-10-23 DIAGNOSIS — R197 Diarrhea, unspecified: Secondary | ICD-10-CM | POA: Diagnosis not present

## 2023-10-23 DIAGNOSIS — L271 Localized skin eruption due to drugs and medicaments taken internally: Secondary | ICD-10-CM | POA: Diagnosis not present

## 2023-10-23 DIAGNOSIS — Z5111 Encounter for antineoplastic chemotherapy: Secondary | ICD-10-CM | POA: Diagnosis not present

## 2023-10-23 DIAGNOSIS — Z95828 Presence of other vascular implants and grafts: Secondary | ICD-10-CM

## 2023-10-23 DIAGNOSIS — Z800 Family history of malignant neoplasm of digestive organs: Secondary | ICD-10-CM | POA: Diagnosis not present

## 2023-10-23 LAB — CBC WITH DIFFERENTIAL (CANCER CENTER ONLY)
Abs Immature Granulocytes: 0.01 10*3/uL (ref 0.00–0.07)
Basophils Absolute: 0.1 10*3/uL (ref 0.0–0.1)
Basophils Relative: 1 %
Eosinophils Absolute: 0.6 10*3/uL — ABNORMAL HIGH (ref 0.0–0.5)
Eosinophils Relative: 13 %
HCT: 32.9 % — ABNORMAL LOW (ref 36.0–46.0)
Hemoglobin: 10.5 g/dL — ABNORMAL LOW (ref 12.0–15.0)
Immature Granulocytes: 0 %
Lymphocytes Relative: 36 %
Lymphs Abs: 1.8 10*3/uL (ref 0.7–4.0)
MCH: 32.8 pg (ref 26.0–34.0)
MCHC: 31.9 g/dL (ref 30.0–36.0)
MCV: 102.8 fL — ABNORMAL HIGH (ref 80.0–100.0)
Monocytes Absolute: 0.5 10*3/uL (ref 0.1–1.0)
Monocytes Relative: 11 %
Neutro Abs: 1.9 10*3/uL (ref 1.7–7.7)
Neutrophils Relative %: 39 %
Platelet Count: 201 10*3/uL (ref 150–400)
RBC: 3.2 MIL/uL — ABNORMAL LOW (ref 3.87–5.11)
RDW: 17 % — ABNORMAL HIGH (ref 11.5–15.5)
WBC Count: 4.9 10*3/uL (ref 4.0–10.5)
nRBC: 0 % (ref 0.0–0.2)

## 2023-10-23 LAB — CEA (ACCESS): CEA (CHCC): 9.36 ng/mL — ABNORMAL HIGH (ref 0.00–5.00)

## 2023-10-23 MED ORDER — CAPECITABINE 500 MG PO TABS
1000.0000 mg/m2 | ORAL_TABLET | Freq: Two times a day (BID) | ORAL | 2 refills | Status: DC
  Filled 2023-10-23: qty 112, 14d supply, fill #0
  Filled 2023-11-01: qty 112, 14d supply, fill #1
  Filled 2023-11-06: qty 112, 28d supply, fill #1
  Filled 2023-11-27: qty 112, 14d supply, fill #2

## 2023-10-23 MED ORDER — SODIUM CHLORIDE 0.9% FLUSH
10.0000 mL | INTRAVENOUS | Status: DC | PRN
  Administered 2023-10-23: 10 mL

## 2023-10-23 MED ORDER — SODIUM CHLORIDE 0.9 % IV SOLN: INTRAVENOUS | Status: DC

## 2023-10-23 MED ORDER — HEPARIN SOD (PORK) LOCK FLUSH 100 UNIT/ML IV SOLN: 500.0000 [IU] | Freq: Once | INTRAVENOUS | Status: DC | PRN

## 2023-10-23 MED ORDER — SODIUM CHLORIDE 0.9% FLUSH: 10.0000 mL | INTRAVENOUS | Status: DC | PRN

## 2023-10-23 MED ORDER — SODIUM CHLORIDE 0.9 % IV SOLN
7.5000 mg/kg | Freq: Once | INTRAVENOUS | Status: AC
  Administered 2023-10-23: 700 mg via INTRAVENOUS
  Filled 2023-10-23: qty 12

## 2023-10-23 NOTE — Progress Notes (Signed)
 Specialty Pharmacy Refill Coordination Note  Kim Castro is a 78 y.o. female contacted today regarding refills of specialty medication(s) Capecitabine  (XELODA )   Patient requested [name] at [facility] at [place] date: 10/27/23   Medication will be filled on 10/26/23.

## 2023-10-23 NOTE — Patient Instructions (Signed)
 [facility]  Discharge Instructions: Thank you for choosing [facility] to provide your oncology and hematology care.   If you have a lab appointment with the Cancer Center, please go directly to the Cancer Center and check in at the registration area.   Wear comfortable clothing and clothing appropriate for easy access to any Portacath or PICC line.   We strive to give you quality time with your provider. You may need to reschedule your appointment if you arrive late (15 or more minutes).  Arriving late affects you and other patients whose appointments are after yours.  Also, if you miss three or more appointments without notifying the office, you may be dismissed from the clinic at the provider's discretion.      For prescription refill requests, have your pharmacy contact our office and allow 72 hours for refills to be completed.    Today you received the following chemotherapy and/or immunotherapy agents: MVASI     To help prevent nausea and vomiting after your treatment, we encourage you to take your nausea medication as directed.  BELOW ARE SYMPTOMS THAT SHOULD BE REPORTED IMMEDIATELY: *FEVER GREATER THAN 100.4 F (38 C) OR HIGHER *CHILLS OR SWEATING *NAUSEA AND VOMITING THAT IS NOT CONTROLLED WITH YOUR NAUSEA MEDICATION *UNUSUAL SHORTNESS OF BREATH *UNUSUAL BRUISING OR BLEEDING *URINARY PROBLEMS (pain or burning when urinating, or frequent urination) *BOWEL PROBLEMS (unusual diarrhea, constipation, pain near the anus) TENDERNESS IN MOUTH AND THROAT WITH OR WITHOUT PRESENCE OF ULCERS (sore throat, sores in mouth, or a toothache) UNUSUAL RASH, SWELLING OR PAIN  UNUSUAL VAGINAL DISCHARGE OR ITCHING   Items with * indicate a potential emergency and should be followed up as soon as possible or go to the Emergency Department if any problems should occur.  Please show the CHEMOTHERAPY ALERT CARD or IMMUNOTHERAPY ALERT  CARD at check-in to the Emergency Department and triage nurse.  Should you have questions after your visit or need to cancel or reschedule your appointment, please contact [facility] A DE[facility]  Dept: [phone]  and follow the prompts.  Office hours are 8:00 a.m. to 4:30 p.m. Monday - Friday. Please note that voicemails left after 4:00 p.m. may not be returned until the following business day.  We are closed weekends and major holidays. You have access to a nurse at all times for urgent questions. Please call the main number to the clinic Dept: [phone] and follow the prompts.   For any non-urgent questions, you may also contact your provider using MyChart. We now offer e-Visits for anyone 109 and older to request care online for non-urgent symptoms. For details visit [email or web address].   Also download the MyChart app! Go to the app store, search "MyChart", open the app, select [place], and log in with your MyChart username and password.

## 2023-10-23 NOTE — Progress Notes (Signed)
 [facility]   Telephone:(336) [phone] Fax:(336) [phone]   Clinic Follow up Note   Patient Care Team: [name], MD as PCP - General (Family Medicine) [name], MD as Consulting Physician (General Surgery) [name], MD as Consulting Physician (Hematology) [name], MD as Consulting Physician (General Surgery) [name], MD as Consulting Physician (Hematology and Oncology) [facility], [facility]   Date of Service:  10/23/2023  CHIEF COMPLAINT: f/u of medical  CURRENT THERAPY:  Xeloda  2000 mg twice daily, 1 week on and 1 week off Bevacizumab  every 3 weeks  Oncology History   Malignant neoplasm of ascending colon (HCC) pT3N2aM0 stage IIB, MSS, metastatic to b/l ovaries and peritoneum and liver, KRAS mutation G12V (+)  -Initially diagnosed in 08/2019, s/p resection with Dr Debby on 11/01/19. Path showed overall Stage IIIB cancer.  -s/p 6 months adjuvant FOLFOX, completed 06/01/20, oxaliplatin  held for final 2 cycles due to neuropathy.  -s/p BSO and hysterectomy on 12/22/21 with Dr. Viktoria. Path revealed metastatic moderately differentiated colonic adenocarcinoma involving both ovaries and peritoneum -she restarted FOLFOX on 01/24/22. -Due to disease progression, chemotherapy changed to second line FOLFIRI and bevacizumab  on April 06, 2022. She has been tolerating moderately well, better now with dose reduction, will continue -Restaging CT scan from 09/27/2022 showed continous response in liver and peritoneal metastasis, no other new lesions.   -We discussed maintenance therapy option in future  -due to her PE in 10/2022, will hold beva for 3 months  -restaging CT 12/31/2022 showed stable disease and live met is not well defined on CT. She restarted beva on 01/17/23  -restaging CT 06/19/2023 showed overall stable disease (stable small lung nodules, no other evidence of cancer) Restaging CT 09/12/2023 showed no residual radiographic  evidence of disease.   - We discussed option of maintenance therapy, treatment changed to capecitabine  and bevacizumab  on 10/02/2023  Assessment & Plan Metastatic colon cancer Metastatic colon cancer managed with oral capecitabine  and intravenous bevacizumab . Transitioned from intravenous chemotherapy to oral capecitabine  for two weeks. Reports manageable side effects, including intermittent diarrhea and decreased appetite during the on-cycle of capecitabine , resolving with loperamide and cessation of the medication. Energy levels remain stable, and she feels slightly better compared to intravenous chemotherapy. Weight is stable, with no new symptoms such as fever, chills, or pain. Blood counts are well-managed with hemoglobin at 10.5, and other counts are normal. Tumor markers are pending. Aware that hair regrowth may take time or may not occur due to previous prolonged intravenous chemotherapy. - Continue oral capecitabine  as prescribed, four tablets twice a day. - Administer intravenous bevacizumab  every three weeks, with the next appointments on July 14 and August 4. - Repeat scan in August to assess disease status. - Refill capecitabine  prescription to provide a two-week supply (112 pills) to reduce pharmacy visits.  Hand-foot syndrome Hand-foot syndrome secondary to capecitabine  treatment, presenting as increased sensitivity in the fingertips to heat and cold, and dry skin without cracking. Manages symptoms with diclofenac and O'Keeffe's Working Hands cream, which she finds effective. Reports no functional impairment in daily activities. - Continue using diclofenac and O'Keeffe's Working Hands cream for symptom management. - Advise daily use of moisturizer to prevent skin dryness and cracking.   Plan - Will continue Xeloda  at current dose, I refilled for her today - Will proceed to bevacizumab  today and continue every 3 weeks - Follow-up in 3 weeks  SUMMARY OF ONCOLOGIC HISTORY: Oncology  History Overview Note   Cancer Staging  Malignant neoplasm of female breast [facility]) Staging form: Breast, AJCC 7th Edition - Clinical stage from 06/11/2014: Stage 0 (Tis (DCIS), N0, M0) - Unsigned Staged by: Pathologist and managing physician Laterality: Left Estrogen receptor status: Positive Progesterone receptor status: Positive Stage used in treatment planning: Yes National guidelines used in treatment planning: Yes Type of national guideline used in treatment planning: NCCN - Pathologic stage from 07/03/2014: Stage Unknown (Tis (DCIS), NX, cM0) - Signed by Guinevere Chew, MD on 07/10/2014 Staged by: Pathologist Laterality: Left Estrogen receptor status: Positive Progesterone receptor status: Positive Stage used in treatment planning: Yes National guidelines used in treatment planning: Yes Type of national guideline used in treatment planning: NCCN Staging comments: Staged on final lumpectomy specimen by Dr. Loral.  Right colon cancer Staging form: Colon and Rectum, AJCC 8th Edition - Pathologic stage from 11/01/2019: Stage IIIB (pT3, pN2a, cM0) - Signed by [name], MD on 11/29/2019 Stage prefix: Initial diagnosis Histologic grading system: 4 grade system Histologic grade (G): G2 Residual tumor (R): R0 - None Tumor deposits (TD): Absent Perineural invasion (PNI): Absent Microsatellite instability (MSI): Stable KRAS mutation: Unknown NRAS mutation: Unknown BRAF mutation: Unknown     Malignant neoplasm of female breast (HCC)  05/29/2014 Initial Biopsy   Left breast needle core biopsy: Grade 2, DCIS with calcs. ER+ (100%), PR+ (96%).    06/04/2014 Initial Diagnosis   Left breast DCIS with calcifications, ER 100%, PR 96%   06/10/2014 Breast MRI   Left breast: 2.4 x 1.3 x 1.1 cm area of patchy non-mass enhancement upper outer quadrant includes postbiopsy seroma; Right breast: 1.2 cm previously biopsied stable benign fibroadenoma   06/12/2014 Procedure   Genetic counseling/testing:  Identified 1 VUS on CHEK2 gene. Remainder of 17 gene panel tested negative and included: ATM, BARD1, BRCA 1/2, BRIP1, CDH1, CHEK2, EPCAM, MLH1, MSH2, MSH6, NBN, NF1, PALB2, PTEN, RAD50, RAD51C, RAD51D, STK11, and TP53.    07/01/2014 Surgery   Left breast lumpectomy ([facility]): Grade 1, DCIS, spanning 2.3 cm, 1 mm margin, ER 100%, PR 96%   07/31/2014 - 08/28/2014 Radiation Therapy   Adjuvant RT completed Signe). Left breast: Total dose 42.5 Gy over 17 fractions. Left breast boost: Total dose 7.5 Gy over 3 fractions.    09/14/2014 - 01/13/2020 Anti-estrogen oral therapy   Anastrazole 1mg  daily. Planned duration of treatment: 5 years [place]). Completed in 01/2020.    09/25/2014 Survivorship   Survivorship Care Plan given to patient and reviewed with her in person.    03/02/2021 Imaging   CT CAP  IMPRESSION: 1. No findings of active/recurrent malignancy. Partial right hemicolectomy. 2. Endometrial stripe remains mildly thickened, but endometrial biopsy in May was negative for malignancy. 3. Progressive endplate sclerosis and endplate irregularity at T2-3, probably due to degenerative endplate findings. If the has referable upper thoracic pain/symptoms then thoracic spine MRI could be used for further workup. 4. Other imaging findings of potential clinical significance: Mild cardiomegaly. Aortic Atherosclerosis (ICD10-I70.0). Mild mitral valve calcification. Postoperative findings in the left breast with adjacent radiation port anteriorly in the left upper lobe. Tiny pulmonary nodules in the left lower lobe are unchanged from earliest available comparison of 10/17/2019 and probably benign although may merit surveillance. Multilevel lumbar impingement. Mild pelvic floor laxity.   Malignant neoplasm of ascending colon (HCC)  09/19/2019 Imaging   CT AP W contrast 09/19/19  IMPRESSION Fullness in the cecum, cannot exclude a mass. No evidence for metastatic disease is identified.    09/24/2019  Procedure   Colonoscopy by Dr  Menon 09/24/19 IMPRESSION 1. The colon was redundant  2. Mild diverticulosis was noted through the entire examined colon 3. Single 12mm polyp was found in the ascending colon; polypectomy was performed using snare cautery and biopsy forceps 4. Mild diverticulosis was notes in the descending colon and sigmoid colon.  5. Single polyp was found in the sigmoid colon, polypectomy was performed with cold forceps.  6. Single polyp was found in the rectosigmoid colon; polypectomy was performed with cold snare  7. Small internal hemorrhoids  8. Large mass was found at the cecum; multiple biopsies of the area were performed using cold forceps; injection (tattooing) was performed distal to the mass.    09/24/2019 Initial Biopsy   INTERPRETATION AND DIAGNOSIS:  A. Cecum, biopsy:  Invasive moderately differentiated adenocarcinoma.  see comment  B. Polyp @ ascending colon, polypectomy:  Tubular Adenoma  C. Polyp @ sigmoid colon Polypectomy:  hyperplastic polyp.  D. Polyp @ rectosigmoid colon, Polypectomy:  Hyperplastic Polyp      10/16/2019 Imaging   CT Chest IMPRESSION: 1. Multiple small pulmonary nodules measuring 5 mm or less in size in the lungs. These are nonspecific and are typically considered statistically likely benign. However, given the patient's history of primary malignancy, close attention on follow-up studies is recommended to ensure stability. 2. Aortic atherosclerosis, in addition to right coronary artery disease. Assessment for potential risk factor modification, dietary therapy or pharmacologic therapy may be warranted, if clinically indicated. 3. There are calcifications of the aortic valve and mitral annulus. Echocardiographic correlation for evaluation of potential valvular dysfunction may be warranted if clinically indicated. 4. Small hiatal hernia.   Aortic Atherosclerosis (ICD10-I70.0).   11/01/2019 Initial Diagnosis   Colon cancer  (HCC)   11/01/2019 Surgery   LAPAROSCOPIC PARTIAL COLECTOMY by Dr Debby and Dr Sheldon   11/01/2019 Pathology Results   FINAL MICROSCOPIC DIAGNOSIS:   A. COLON, PROXIMAL RIGHT, COLECTOMY:  - Invasive colonic adenocarcinoma, 5 cm.  - Tumor invades through the muscularis propria into pericolonic tissues.   - Margins of resection are not involved.  - Metastatic carcinoma in (5) of (13) lymph nodes.  - See oncology table.    MSI Stable  Mismatch repair normal  MLH1 - Preserved nuclear expression (greater 50% tumor expression) MSH2 - Preserved nuclear expression (greater 50% tumor expression) MSH6 - Preserved nuclear expression (greater 50% tumor expression) PMS2 - Preserved nuclear expression (greater 50% tumor expression)   11/01/2019 Cancer Staging   Staging form: Colon and Rectum, AJCC 8th Edition - Pathologic stage from 11/01/2019: Stage IIIB (pT3, pN2a, cM0) - Signed by [name], MD on 11/29/2019   12/10/2019 Procedure   PAC placed 12/10/19   12/17/2019 - 06/01/2020 Chemotherapy   FOLFOX q2weeks starting in 2 weeks starting 12/17/19. Held 01/27/20-02/10/20 due to b/l PE. Oxaliplatin  held C11-12 due to neuropathy. Completed on 06/01/20   03/02/2021 Imaging   CT CAP  IMPRESSION: 1. No findings of active/recurrent malignancy. Partial right hemicolectomy. 2. Endometrial stripe remains mildly thickened, but endometrial biopsy in May was negative for malignancy. 3. Progressive endplate sclerosis and endplate irregularity at T2-3, probably due to degenerative endplate findings. If the has referable upper thoracic pain/symptoms then thoracic spine MRI could be used for further workup. 4. Other imaging findings of potential clinical significance: Mild cardiomegaly. Aortic Atherosclerosis (ICD10-I70.0). Mild mitral valve calcification. Postoperative findings in the left breast with adjacent radiation port anteriorly in the left upper lobe. Tiny pulmonary nodules in the left lower lobe are  unchanged from earliest available  comparison of 10/17/2019 and probably benign although may merit surveillance. Multilevel lumbar impingement. Mild pelvic floor laxity.   08/26/2021 Imaging   EXAM: CT CHEST, ABDOMEN, AND PELVIS WITH CONTRAST  IMPRESSION: 1. Stable examination without new or progressive findings to suggest local recurrence or metastatic disease within the chest, abdomen, or pelvis. 2. Hepatomegaly with hepatic steatosis. 3. Sigmoid colonic diverticulosis without findings of acute diverticulitis. 4. Similar prominent endplate sclerosis and irregularity at T2-T3 is most consistent with Modic type endplate degenerative changes. However, if patient has referable upper thoracic pain consider further workup with thoracic spine MRI. 5. Similar thickening of the endometrial stripe measuring up to 8 mm, which was previously biopsied with results negative for malignancy. 6. Aortic Atherosclerosis (ICD10-I70.0).   11/10/2021 PET scan   IMPRESSION: 1. LEFT ovary is increased in size and is intensely hypermetabolic. While physiologic hypermetabolic ovarian tissue is not uncommon, the enlargement and asymmetric activity warrants further evaluation. Consider contrast pelvic MRI vs tissue sampling. 2. No evidence of metastatic colorectal carcinoma otherwise. 3. Post RIGHT hemicolectomy anatomy. 4. Evidence of radiation change in the LEFT upper lobe (remote breast cancer).     12/22/2021 Relapse/Recurrence    FINAL MICROSCOPIC DIAGNOSIS:   A. LEFT OVARY AND FALLOPIAN TUBE, SALPINGO OOPHORECTOMY:  - Metastatic moderately differentiated colonic adenocarcinoma involving left ovary  - Focal ovarian stromal calcification  - Segment of benign fallopian tube   B. UTERUS WITH RIGHT FALLOPIAN TUBE AND OVARY, HYSTERECTOMY AND SALPINGO-OOPHORECTOMY:  - Metastatic moderately differentiated colonic adenocarcinoma involving right ovary  - Focal invasive extensive adenomyosis  - Benign  endometrial polyps  - Benign proliferative phase endometrium  - Hydrosalpinx of right fallopian tube  - Focal ovarian stromal calcification   C. PERITONEAL DEPOSITS, ANTERIOR CUL DE SAC, BIOPSY:  - Metastatic mucinous adenocarcinoma, consistent with colorectal primary    COMMENT:  Immunohistochemical stains show that the tumor cells are positive for CK20 and CDX2 while they are negative for CK7 and PAX8, consistent with above interpretation.    01/24/2022 - 03/23/2022 Chemotherapy   Patient is on Treatment Plan : COLORECTAL FOLFOX q14d x 3 months     04/01/2022 Imaging   CT AP IMPRESSION: 1. New omental metastatic disease. 2. Vague hypoattenuating lesion in the inferior right hepatic lobe is new and also worrisome for metastatic disease. 3. Similar small right lower lobe nodules. Recommend continued attention on follow-up. 4. Steatotic enlarged liver. 5.  Aortic atherosclerosis (ICD10-I70.0).   04/06/2022 - 09/18/2023 Chemotherapy   Patient is on Treatment Plan : COLORECTAL FOLFIRI + Bevacizumab  q14d     04/21/2022 Imaging    IMPRESSION: 1. Stable chest CT. No evidence of metastatic disease. 2. Stable small pulmonary nodules, considered benign based on stability. 3. Stable postsurgical changes in the left breast and anterior left upper lobe radiation changes. 4. Aortic Atherosclerosis (ICD10-I70.0) and Emphysema (ICD10-J43.9).   04/21/2022 Imaging    IMPRESSION: 1. Small enhancing lesion inferiorly in the right hepatic lobe is typical of metastatic disease and stable from recent abdominal CT. No other definite liver lesions are identified. Mild hepatic steatosis. 2. No other significant abdominal findings. 3. Omental nodularity seen on CT is not well visualized by MRI.    Imaging     06/23/2022 Imaging    IMPRESSION: 1. Hypodense lesion of the inferior right lobe of the liver, hepatic segment VI is diminished in size, consistent with treatment response. 2. Tiny  peritoneal and omental nodules identified by prior examination are diminished in size, consistent with treatment response.  3. Multiple tiny pulmonary nodules unchanged, most likely benign and incidental, however continued attention on follow-up warranted in the setting of known metastatic disease. 4. No evidence of new metastatic disease in the chest, abdomen, or pelvis. 5. Status post partial right hemicolectomy and reanastomosis. 6. Diffuse mosaic attenuation of the airspaces, consistent with small airways disease. 7. Hepatomegaly.   10/02/2023 -  Chemotherapy   Patient is on Treatment Plan : COLORECTAL Bevacizumab  q21d        Discussed the use of AI scribe software for clinical note transcription with the patient, who gave verbal consent to proceed.  History of Present Illness Kim Castro is a 78 year old female with metastatic colon cancer who presents for follow-up.  She is on oral Xeloda , having transitioned from intravenous chemotherapy, and has completed two doses over the past two weeks. She experiences increased sensitivity in her hands to heat and cold, and some skin dryness, managed with Voltaren and O'Keeffe's Working Hands. Her energy levels are stable compared to intravenous chemotherapy, and she feels slightly better overall. She experiences appetite loss and diarrhea on the medication, which resolves with Imodium and medication pauses. Her weight is stable, with no fever, chills, or other pain.  She continues intravenous bevacizumab  every three weeks, with the next dose scheduled for July 14. Her hemoglobin level is 10.5. She is taking pregabalin  and plans to refill it soon. She performs her usual activities but finds mowing her yard challenging due to heat and incline, with a neighbor assisting with flat areas.     All other systems were reviewed with the patient and are negative.  MEDICAL HISTORY:  Past Medical History:  Diagnosis Date   Aortic  atherosclerosis (HCC)    Arthritis    feet, lower back   Basal cell carcinoma    arm   Breast cancer of upper-outer quadrant of left female breast (HCC) 06/04/2014   Cataract    immature on the left   Colon cancer (HCC) 08/2019   Diabetes mellitus without complication (HCC)    Diverticulosis    Dizziness    > 51yrs ago;took Antivert    Family history of anesthesia complication    sister slow to wake up with anesthesia   Family history of breast cancer    Family history of colon cancer    Family history of uterine cancer    GERD (gastroesophageal reflux disease)    takes occasional TUMs   History of bronchitis    > 96yrs ago   History of colon polyps    History of hiatal hernia    Small noted on CT   History of pulmonary embolus (PE)    Hypertension    takes Losartan  daily and HCTZ   Iron deficiency anemia    Joint pain    Metastatic disease (HCC) 2023   peritoneum and liver mets   Numbness    to toes on each foot   Peripheral neuropathy    feet and toes   Personal history of radiation therapy    Pulmonary nodules    Noted on CT   Radiation 07/31/14-08/28/14   Left Breast 20 fxs   Seasonal allergies    takes Claritin prn   Urinary frequency    Vitamin D deficiency    takes VIt D daily    SURGICAL HISTORY: Past Surgical History:  Procedure Laterality Date   BREAST BIOPSY Bilateral    BREAST LUMPECTOMY Left    BREAST LUMPECTOMY WITH RADIOACTIVE SEED LOCALIZATION  Left 07/01/2014   Procedure: LEFT BREAST LUMPECTOMY WITH RADIOACTIVE SEED LOCALIZATION;  Surgeon: Morene Olives, MD;  Location: [facility];  Service: General;  Laterality: Left;   CATARACT EXTRACTION Right    COLONOSCOPY  09/24/2019   [place]   LAPAROSCOPIC PARTIAL COLECTOMY N/A 11/01/2019   Procedure: LAPAROSCOPIC PARTIAL COLECTOMY;  Surgeon: [name], MD;  Location: [facility];  Service: General;  Laterality: N/A;   POLYPECTOMY     PORTACATH PLACEMENT N/A 12/10/2019   Procedure:  INSERTION PORT-A-CATH ULTRASOUND GUIDED IN RIGHT IJ;  Surgeon: [name], MD;  Location: [facility];  Service: General;  Laterality: N/A;   ROBOTIC ASSISTED TOTAL HYSTERECTOMY Bilateral 12/22/2021   Procedure: XI ROBOTIC ASSISTED TOTAL HYSTERECTOMY WITH BILATERAL SALPINGO OOPHORECTOMY;  Surgeon: Viktoria Comer SAUNDERS, MD;  Location: [facility];  Service: Gynecology;  Laterality: Bilateral;   TOTAL KNEE ARTHROPLASTY Left 10/24/2012   Procedure: TOTAL KNEE ARTHROPLASTY;  Surgeon: Dempsey JINNY Sensor, MD;  Location: [facility];  Service: Orthopedics;  Laterality: Left;   TOTAL KNEE ARTHROPLASTY Right 01/09/2013   Procedure: TOTAL KNEE ARTHROPLASTY;  Surgeon: Dempsey JINNY Sensor, MD;  Location: [facility];  Service: Orthopedics;  Laterality: Right;   TUBAL LIGATION      I have reviewed the social history and family history with the patient and they are unchanged from previous note.  ALLERGIES:  is allergic to oxycodone .  MEDICATIONS:  Current Outpatient Medications  Medication Sig Dispense Refill   b complex vitamins capsule Take 1 capsule by mouth daily.     Cholecalciferol (VITAMIN D) 2000 UNITS CAPS Take 2,000 Units by mouth daily.      ELIQUIS  5 MG TABS tablet Take 1 tablet by mouth twice daily 60 tablet 0   ibuprofen (ADVIL) 200 MG tablet Take 200 mg by mouth daily as needed (pain).     lidocaine -prilocaine  (EMLA ) cream Apply 1 Application topically as needed. 30 g 1   loratadine (CLARITIN) 10 MG tablet Take 10 mg by mouth daily as needed for allergies.     losartan  (COZAAR ) 25 MG tablet Take 1 tablet (25 mg total) by mouth daily. 90 tablet 3   metFORMIN  (GLUCOPHAGE ) 500 MG tablet Take 1 tablet (500 mg total) by mouth in the morning and at bedtime. 180 tablet 3   Multiple Vitamins-Minerals (MULTIVITAMIN WITH MINERALS) tablet Take 1 tablet by mouth daily.     omeprazole (PRILOSEC) 20 MG capsule Take 20 mg by mouth daily before breakfast.      ondansetron  (ZOFRAN ) 8 MG tablet Take 1 tablet (8 mg total) by mouth every  8 (eight) hours as needed for nausea or vomiting. Start on the third day after chemotherapy. 30 tablet 1   potassium chloride  (KLOR-CON ) 10 MEQ tablet Take 1 tablet (10 mEq total) by mouth 2 (two) times daily. 60 tablet 2   pravastatin  (PRAVACHOL ) 10 MG tablet Take 1 tablet (10 mg total) by mouth daily. 90 tablet 3   pregabalin  (LYRICA ) 25 MG capsule Take 1 capsule (25 mg total) by mouth at bedtime. In addition to 50mg  at bedtime (for total of 75 mg at bedtime) 30 capsule 1   pregabalin  (LYRICA ) 50 MG capsule Take 1 capsule (50 mg total) by mouth 2 (two) times daily. Take additional 25 mg at bedtime (for total of 75,mg at bedtime). 60 capsule 2   prochlorperazine  (COMPAZINE ) 10 MG tablet Take 1 tablet (10 mg total) by mouth every 6 (six) hours as needed for nausea or vomiting. 30 tablet 1   traMADol  (  ULTRAM ) 50 MG tablet Take 1 tablet (50 mg total) by mouth every 6 (six) hours as needed. 30 tablet 0   triamcinolone  ointment (KENALOG ) 0.5 % Apply 1 Application topically 2 (two) times daily. 30 g 0   capecitabine  (XELODA ) 500 MG tablet Take 4 tablets (2,000 mg total) by mouth 2 (two) times daily after a meal. Take within 30 minutes after meals. Take for 7 days on and 7 days off, then repeat. 112 tablet 2   No current facility-administered medications for this visit.    PHYSICAL EXAMINATION: ECOG PERFORMANCE STATUS: 1 - Symptomatic but completely ambulatory  Vitals:   10/23/23 0816  BP: (!) 120/56  Pulse: 75  Resp: 17  Temp: 98.4 F (36.9 C)  SpO2: 97%   Wt Readings from Last 3 Encounters:  10/23/23 204 lb 11.2 oz (92.9 kg)  10/02/23 201 lb 14.4 oz (91.6 kg)  09/18/23 205 lb 11.2 oz (93.3 kg)     GENERAL:alert, no distress and comfortable SKIN: skin color, texture, turgor are normal, no rashes or significant lesions except dry skin in hands EYES: normal, Conjunctiva are pink and non-injected, sclera clear Musculoskeletal:no cyanosis of digits and no clubbing  NEURO: alert & oriented x  3 with fluent speech, no focal motor/sensory deficits EXTREMITIES: No significant swelling in legs. Physical Exam   LABORATORY DATA:  I have reviewed the data as listed    Latest Ref Rng & Units 10/23/2023    7:48 AM 10/02/2023    8:39 AM 09/18/2023    9:00 AM  CBC  WBC 4.0 - 10.5 K/uL 4.9  4.2  15.3   Hemoglobin 12.0 - 15.0 g/dL 89.4  9.6  89.3   Hematocrit 36.0 - 46.0 % 32.9  30.4  33.3   Platelets 150 - 400 K/uL 201  186  200         Latest Ref Rng & Units 10/02/2023    8:39 AM 09/18/2023    9:00 AM 09/12/2023    9:06 AM  CMP  Glucose 70 - 99 mg/dL 96  866  892   BUN 8 - 23 mg/dL 21  18    Creatinine 9.55 - 1.00 mg/dL 9.12  8.99    Sodium 864 - 145 mmol/L 142  142    Potassium 3.5 - 5.1 mmol/L 4.3  4.0    Chloride 98 - 111 mmol/L 115  111    CO2 22 - 32 mmol/L 21  24    Calcium  8.9 - 10.3 mg/dL 8.9  9.2    Total Protein 6.5 - 8.1 g/dL 6.4  6.4    Total Bilirubin 0.0 - 1.2 mg/dL 0.5  0.7    Alkaline Phos 38 - 126 U/L 140  141    AST 15 - 41 U/L 20  20    ALT 0 - 44 U/L 14  14        RADIOGRAPHIC STUDIES: I have personally reviewed the radiological images as listed and agreed with the findings in the report. No results found.    Orders Placed This Encounter  Procedures   Comprehensive metabolic panel with GFR    Standing Status:   Standing    Number of Occurrences:   50    Expiration Date:   10/22/2024   CBC with Differential (Cancer Center Only)    Standing Status:   Future    Expected Date:   12/04/2023    Expiration Date:   12/03/2024   CBC with Differential (Cancer Center  Only)    Standing Status:   Future    Expected Date:   12/25/2023    Expiration Date:   12/24/2024   CBC with Differential (Cancer Center Only)    Standing Status:   Future    Expected Date:   01/15/2024    Expiration Date:   01/14/2025   Total Protein, Urine dipstick    Standing Status:   Future    Expected Date:   01/15/2024    Expiration Date:   01/14/2025   All questions were answered.  The patient knows to call the clinic with any problems, questions or concerns. No barriers to learning was detected. The total time spent in the appointment was 25 minutes, including review of chart and various tests results, discussions about plan of care and coordination of care plan     Onita Mattock, MD 10/23/2023

## 2023-10-24 ENCOUNTER — Encounter: Payer: Self-pay | Admitting: Hematology

## 2023-10-27 ENCOUNTER — Other Ambulatory Visit: Payer: Self-pay

## 2023-10-30 ENCOUNTER — Ambulatory Visit
Admission: RE | Admit: 2023-10-30 | Discharge: 2023-10-30 | Disposition: A | Discharge status: Home / Self Care | Source: Ambulatory Visit | Attending: Hematology

## 2023-10-30 ENCOUNTER — Other Ambulatory Visit: Payer: Self-pay | Admitting: Hematology

## 2023-10-30 DIAGNOSIS — R200 Anesthesia of skin: Secondary | ICD-10-CM

## 2023-10-30 DIAGNOSIS — Z1231 Encounter for screening mammogram for malignant neoplasm of breast: Secondary | ICD-10-CM

## 2023-10-30 HISTORY — DX: Encounter for nonprocreative screening for genetic disease carrier status: Z13.71

## 2023-10-31 DIAGNOSIS — K080 Exfoliation of teeth due to systemic causes: Secondary | ICD-10-CM | POA: Diagnosis not present

## 2023-11-01 ENCOUNTER — Other Ambulatory Visit: Payer: Self-pay

## 2023-11-02 ENCOUNTER — Other Ambulatory Visit: Payer: Self-pay | Admitting: Family Medicine

## 2023-11-02 DIAGNOSIS — I10 Essential (primary) hypertension: Secondary | ICD-10-CM

## 2023-11-06 ENCOUNTER — Other Ambulatory Visit: Payer: Self-pay

## 2023-11-07 ENCOUNTER — Other Ambulatory Visit: Payer: Self-pay | Admitting: Pharmacy Technician

## 2023-11-07 ENCOUNTER — Other Ambulatory Visit: Payer: Self-pay | Admitting: Hematology

## 2023-11-07 ENCOUNTER — Other Ambulatory Visit: Payer: Self-pay

## 2023-11-07 NOTE — Progress Notes (Signed)
 Specialty Pharmacy Refill Coordination Note  Kim Castro is a 78 y.o. female contacted today regarding refills of specialty medication(s) Capecitabine  (XELODA )   Patient requested [name] at [facility] [place] date: 11/21/23   Medication will be filled on 11/20/23.

## 2023-11-10 ENCOUNTER — Ambulatory Visit (INDEPENDENT_AMBULATORY_CARE_PROVIDER_SITE_OTHER): Payer: Medicare Other

## 2023-11-10 VITALS — BP 128/60 | HR 71 | Temp 98.0°F | Ht 65.5 in | Wt 204.4 lb

## 2023-11-10 DIAGNOSIS — Z0000 Encounter for general adult medical examination without abnormal findings: Secondary | ICD-10-CM

## 2023-11-10 NOTE — Progress Notes (Signed)
 Subjective:   Kim Castro is a 78 y.o. who presents for a Medicare Wellness preventive visit.  As a reminder, Annual Wellness Visits don't include a physical exam, and some assessments may be limited, especially if this visit is performed virtually. We may recommend an in-person follow-up visit with your provider if needed.  Visit Complete: In person    Persons Participating in Visit: Patient.  AWV Questionnaire: Yes: Patient Medicare AWV questionnaire was completed by the patient on 11/06/2023; I have confirmed that all information answered by patient is correct and no changes since this date.  Cardiac Risk Factors include: diabetes mellitus;advanced age (>48men, >58 women);dyslipidemia;hypertension     Objective:    Today's Vitals   11/10/23 0900 11/10/23 0901  BP: 128/60   Pulse: 71   Temp: 98 F (36.7 C)   TempSrc: Oral   Weight: 204 lb 6.4 oz (92.7 kg)   Height: 5' 5.5 (1.664 m)   PainSc:  3    Body mass index is 33.5 kg/m.     11/10/2023    9:09 AM 01/16/2023    9:43 AM 11/04/2022    9:01 AM 10/02/2022    7:30 PM 08/22/2022   10:43 AM 12/23/2021    2:45 AM 12/22/2021   10:20 PM  Advanced Directives  Does Patient Have a Medical Advance Directive? Yes No Yes No No  No  Type of Advance Directive Out of facility DNR (pink MOST or yellow form)  Healthcare Power of [place];Living will      Does patient want to make changes to medical advance directive?  No - Patient declined  No - Patient declined No - Patient declined    Copy of Healthcare Power of Attorney in Chart?   Yes - validated most recent copy scanned in chart (See row information)      Would patient like information on creating a medical advance directive?  No - Patient declined  No - Patient declined  No - Patient declined   Pre-existing out of facility DNR order (yellow form or pink MOST form) Pink MOST/Yellow Form most recent copy in chart - Physician notified to receive inpatient order           Current Medications (verified) Outpatient Encounter Medications as of 11/10/2023  Medication Sig   b complex vitamins capsule Take 1 capsule by mouth daily.   capecitabine  (XELODA ) 500 MG tablet Take 4 tablets (2,000 mg total) by mouth 2 (two) times daily after a meal. Take within 30 minutes after meals. Take for 7 days on and 7 days off, then repeat.   Cholecalciferol (VITAMIN D) 2000 UNITS CAPS Take 2,000 Units by mouth daily.    ELIQUIS  5 MG TABS tablet Take 1 tablet by mouth twice daily   famotidine (PEPCID) 20 MG tablet Take 20 mg by mouth daily.   ibuprofen (ADVIL) 200 MG tablet Take 200 mg by mouth daily as needed (pain).   lidocaine -prilocaine  (EMLA ) cream Apply 1 Application topically as needed.   loratadine (CLARITIN) 10 MG tablet Take 10 mg by mouth daily as needed for allergies.   losartan  (COZAAR ) 25 MG tablet Take 1 tablet by mouth once daily   metFORMIN  (GLUCOPHAGE ) 500 MG tablet Take 1 tablet (500 mg total) by mouth in the morning and at bedtime.   Multiple Vitamins-Minerals (MULTIVITAMIN WITH MINERALS) tablet Take 1 tablet by mouth daily.   ondansetron  (ZOFRAN ) 8 MG tablet Take 1 tablet (8 mg total) by mouth every 8 (eight) hours as needed  for nausea or vomiting. Start on the third day after chemotherapy.   potassium chloride  (KLOR-CON ) 10 MEQ tablet Take 1 tablet by mouth twice daily   pravastatin  (PRAVACHOL ) 10 MG tablet Take 1 tablet (10 mg total) by mouth daily.   pregabalin  (LYRICA ) 25 MG capsule TAKE 1 CAPSULE BY MOUTH ONCE DAILY IN  ADDITION  TO  50MG   AT  BEDTIME   pregabalin  (LYRICA ) 50 MG capsule Take 1 capsule (50 mg total) by mouth 2 (two) times daily. Take additional 25 mg at bedtime (for total of 75,mg at bedtime).   prochlorperazine  (COMPAZINE ) 10 MG tablet Take 1 tablet (10 mg total) by mouth every 6 (six) hours as needed for nausea or vomiting.   omeprazole (PRILOSEC) 20 MG capsule Take 20 mg by mouth daily before breakfast.  (Patient not taking: Reported on  11/10/2023)   traMADol  (ULTRAM ) 50 MG tablet Take 1 tablet (50 mg total) by mouth every 6 (six) hours as needed. (Patient not taking: Reported on 11/10/2023)   triamcinolone  ointment (KENALOG ) 0.5 % Apply 1 Application topically 2 (two) times daily. (Patient not taking: Reported on 11/10/2023)   No facility-administered encounter medications on file as of 11/10/2023.    Allergies (verified) Oxycodone    History: Past Medical History:  Diagnosis Date   Aortic atherosclerosis (HCC)    Arthritis    feet, lower back   Basal cell carcinoma    arm   BRCA gene mutation negative    Breast cancer of upper-outer quadrant of left female breast (HCC) 06/04/2014   Cataract    immature on the left   Colon cancer (HCC) 08/2019   Diabetes mellitus without complication (HCC)    Diverticulosis    Dizziness    > 46yrs ago;took Antivert    Family history of anesthesia complication    sister slow to wake up with anesthesia   Family history of breast cancer    Family history of colon cancer    Family history of uterine cancer    GERD (gastroesophageal reflux disease)    takes occasional TUMs   History of bronchitis    > 73yrs ago   History of colon polyps    History of hiatal hernia    Small noted on CT   History of pulmonary embolus (PE)    Hypertension    takes Losartan  daily and HCTZ   Iron deficiency anemia    Joint pain    Metastatic disease (HCC) 2023   peritoneum and liver mets   Numbness    to toes on each foot   Peripheral neuropathy    feet and toes   Personal history of radiation therapy    Pulmonary nodules    Noted on CT   Radiation 07/31/14-08/28/14   Left Breast 20 fxs   Seasonal allergies    takes Claritin prn   Urinary frequency    Vitamin D deficiency    takes VIt D daily   Past Surgical History:  Procedure Laterality Date   BREAST BIOPSY Bilateral    BREAST LUMPECTOMY Left    BREAST LUMPECTOMY WITH RADIOACTIVE SEED LOCALIZATION Left 07/01/2014   Procedure: LEFT  BREAST LUMPECTOMY WITH RADIOACTIVE SEED LOCALIZATION;  Surgeon: Morene Olives, MD;  Location: [facility];  Service: General;  Laterality: Left;   CATARACT EXTRACTION Right    COLONOSCOPY  09/24/2019   [place]   LAPAROSCOPIC PARTIAL COLECTOMY N/A 11/01/2019   Procedure: LAPAROSCOPIC PARTIAL COLECTOMY;  Surgeon: [name], MD;  Location: [facility];  Service: General;  Laterality: N/A;   POLYPECTOMY     PORTACATH PLACEMENT N/A 12/10/2019   Procedure: INSERTION PORT-A-CATH ULTRASOUND GUIDED IN RIGHT IJ;  Surgeon: [name], MD;  Location: [facility];  Service: General;  Laterality: N/A;   ROBOTIC ASSISTED TOTAL HYSTERECTOMY Bilateral 12/22/2021   Procedure: XI ROBOTIC ASSISTED TOTAL HYSTERECTOMY WITH BILATERAL SALPINGO OOPHORECTOMY;  Surgeon: Viktoria Comer SAUNDERS, MD;  Location: [facility];  Service: Gynecology;  Laterality: Bilateral;   TOTAL KNEE ARTHROPLASTY Left 10/24/2012   Procedure: TOTAL KNEE ARTHROPLASTY;  Surgeon: Dempsey JINNY Sensor, MD;  Location: [facility];  Service: Orthopedics;  Laterality: Left;   TOTAL KNEE ARTHROPLASTY Right 01/09/2013   Procedure: TOTAL KNEE ARTHROPLASTY;  Surgeon: Dempsey JINNY Sensor, MD;  Location: [facility];  Service: Orthopedics;  Laterality: Right;   TUBAL LIGATION     Family History  Problem Relation Age of Onset   Diabetes Mother    Uterine cancer Mother        deceased 59   Hypertension Father    Heart disease Father    Diabetes Sister    Breast cancer Sister 98       BRCA neg   Hypertension Sister    Diabetes Sister    Breast cancer Sister 69       BRCA neg   Diabetes Sister    Kidney disease Sister    Heart disease Sister    Lupus Sister    Breast cancer Sister 26       BRCA neg   Heart disease Sister    Diabetes Sister    Breast cancer Sister 78       BRCA neg   Diabetes Sister    Thyroid cancer Daughter 63   Breast cancer Daughter 38       BRCA neg   Colon polyps Daughter    Cancer Maternal Uncle        unk. primary; deceased 64s    Stroke Maternal Grandmother    Hypertension Brother    Diabetes Brother    Colon cancer Brother 43   Breast cancer Niece 61   Esophageal cancer Neg Hx    Rectal cancer Neg Hx    Stomach cancer Neg Hx    Social History   Socioeconomic History   Marital status: Married    Spouse name: Not on file   Number of children: 3   Years of education: Not on file   Highest education level: 12th grade  Occupational History   Occupation: Retired    Comment: Designer, television/film set  Tobacco Use   Smoking status: Never   Smokeless tobacco: Never  Vaping Use   Vaping status: Never Used  Substance and Sexual Activity   Alcohol use: No   Drug use: No   Sexual activity: Yes    Birth control/protection: Surgical, Post-menopausal  Other Topics Concern   Not on file  Social History Narrative   Not on file   Social Drivers of Health   Financial Resource Strain: Low Risk  (11/06/2023)   Overall Financial Resource Strain (CARDIA)    Difficulty of Paying Living Expenses: Not hard at all  Food Insecurity: No Food Insecurity (11/06/2023)   Hunger Vital Sign    Worried About Running Out of Food in the Last Year: Never true    Ran Out of Food in the Last Year: Never true  Transportation Needs: No Transportation Needs (11/06/2023)   PRAPARE - Administrator, Civil Service (Medical): No  Lack of Transportation (Non-Medical): No  Physical Activity: Sufficiently Active (11/06/2023)   Exercise Vital Sign    Days of Exercise per Week: 3 days    Minutes of Exercise per Session: 120 min  Stress: No Stress Concern Present (11/06/2023)   [employer] of Occupational Health - Occupational Stress Questionnaire    Feeling of Stress: Not at all  Social Connections: Moderately Isolated (11/06/2023)   Social Connection and Isolation Panel    Frequency of Communication with Friends and Family: Twice a week    Frequency of Social Gatherings with Friends and Family: Twice a week    Attends Religious Services:  Patient declined    Database administrator or Organizations: No    Attends Engineer, structural: Never    Marital Status: Married    Tobacco Counseling Counseling given: Not Answered    Clinical Intake:  Pre-visit preparation completed: Yes  Pain : 0-10 Pain Score: 3  Pain Type: Chronic pain Pain Location: Back Pain Orientation: Lower Pain Descriptors / Indicators: Aching Pain Onset: More than a month ago Pain Frequency: Constant     Nutritional Status: BMI > 30  Obese Nutritional Risks: Nausea/ vomitting/ diarrhea (diarrhea, takes immodium) Diabetes: Yes CBG done?: No Did pt. bring in CBG monitor from home?: No  Lab Results  Component Value Date   HGBA1C 6.1 09/12/2023   HGBA1C 6.5 05/30/2023   HGBA1C 6.5 02/23/2023     How often do you need to have someone help you when you read instructions, pamphlets, or other written materials from your doctor or pharmacy?: 1 - Never  Interpreter Needed?: No  Information entered by :: NAllen LPN   Activities of Daily Living     11/06/2023    9:46 AM  In your present state of health, do you have any difficulty performing the following activities:  Hearing? 0  Vision? 0  Difficulty concentrating or making decisions? 0  Walking or climbing stairs? 0  Dressing or bathing? 0  Doing errands, shopping? 0  Preparing Food and eating ? N  Using the Toilet? N  In the past six months, have you accidently leaked urine? N  Do you have problems with loss of bowel control? N  Managing your Medications? N  Managing your Finances? N  Housekeeping or managing your Housekeeping? N    Patient Care Team: [name], MD as PCP - General (Family Medicine) [name], MD as Consulting Physician (General Surgery) [name], MD as Consulting Physician (Hematology) [name], MD as Consulting Physician (General Surgery) [name], MD as Consulting Physician (Hematology and Oncology) [facility], [facility]   I have updated your Care Teams any recent Medical Services you may have received from other providers in the past year.     Assessment:   This is a routine wellness examination for [facility].  Hearing/Vision screen Hearing Screening - Comments:: Denies hearing issues Vision Screening - Comments:: Regular eye exams, [facility]   Goals Addressed             This Visit's Progress    Patient Stated       11/10/2023, get off chemo       Depression Screen     11/10/2023    9:10 AM 11/15/2022   10:44 AM 11/04/2022    9:02 AM 09/01/2021    9:29 AM 09/01/2021    9:23 AM 09/01/2021    9:22 AM 05/12/2021    1:03 PM  PHQ 2/9 Scores  PHQ - 2 Score 0 0 0 0 0 0 0  PHQ- 9 Score 0  1        Fall Risk     11/06/2023    9:46 AM 11/15/2022   10:44 AM 10/30/2022    7:35 AM 09/01/2021    9:23 AM 05/12/2021    1:03 PM  Fall Risk   Falls in the past year? 1 0 0 0 1  Comment mowing      Number falls in past yr: 0 0 0 0 0  Injury with Fall? 0 0 0 0 0  Risk for fall due to : Medication side effect No Fall Risks Medication side effect  No Fall Risks  Follow up Falls prevention discussed;Falls evaluation completed Falls evaluation completed Falls prevention discussed;Falls evaluation completed Falls evaluation completed  Falls evaluation completed      Data saved with a previous flowsheet row definition    MEDICARE RISK AT HOME:  Medicare Risk at Home Any stairs in or around the home?: (Patient-Rptd) Yes If so, are there any without handrails?: (Patient-Rptd) Yes Home free of loose throw rugs in walkways, pet beds, electrical cords, etc?: (Patient-Rptd) Yes Adequate lighting in your home to reduce risk of falls?: (Patient-Rptd) Yes Life alert?: (Patient-Rptd) No Use of a cane, walker or w/c?: (Patient-Rptd) Yes Grab bars in the bathroom?: (Patient-Rptd) Yes Shower chair or bench in shower?: (Patient-Rptd) No Elevated toilet seat or a handicapped toilet?: (Patient-Rptd) No  TIMED  UP AND GO:  Was the test performed?  Yes  Length of time to ambulate 10 feet: 5 sec Gait slow and steady with assistive device  Cognitive Function: 6CIT completed        11/10/2023    9:11 AM 11/04/2022    9:03 AM  6CIT Screen  What Year? 0 points 0 points  What month? 0 points 0 points  What time? 0 points 0 points  Count back from 20 0 points 0 points  Months in reverse 0 points 0 points  Repeat phrase 0 points 0 points  Total Score 0 points 0 points    Immunizations Immunization History  Administered Date(s) Administered   Fluad Quad(high Dose 65+) 01/28/2020, 02/09/2022   Fluad Trivalent(High Dose 65+) 02/23/2023   Influenza, High Dose Seasonal PF 05/14/2015, 06/14/2017   Influenza,inj,Quad PF,6-35 Mos 05/27/2016   Influenza-Unspecified 03/17/2021   PFIZER(Purple Top)SARS-COV-2 Vaccination 06/25/2019, 07/16/2019, 01/08/2020   Pneumococcal Conjugate-13 05/07/2014   Pneumococcal Polysaccharide-23 04/12/2013   Td 07/11/2018   Tdap 08/15/2007   Zoster Recombinant(Shingrix) 06/12/2021, 09/14/2021   Zoster, Live 03/16/2011    Screening Tests Health Maintenance  Topic Date Due   COVID-19 Vaccine (4 - 2024-25 season) 01/01/2023   OPHTHALMOLOGY EXAM  10/18/2023   Colonoscopy  02/11/2024   FOOT EXAM  11/15/2023   INFLUENZA VACCINE  12/01/2023   HEMOGLOBIN A1C  03/14/2024   Diabetic kidney evaluation - Urine ACR  09/11/2024   Diabetic kidney evaluation - eGFR measurement  10/01/2024   Medicare Annual Wellness (AWV)  11/09/2024   DTaP/Tdap/Td (3 - Td or Tdap) 07/10/2028   Pneumococcal Vaccine: 50+ Years  Completed   DEXA SCAN  Completed   Hepatitis C Screening  Completed   Zoster Vaccines- Shingrix  Completed   Hepatitis B Vaccines  Aged Out   HPV VACCINES  Aged Out   Meningococcal B Vaccine  Aged Out    Health Maintenance  Health Maintenance Due  Topic Date Due   COVID-19 Vaccine (4 - 2024-25 season)  01/01/2023   OPHTHALMOLOGY EXAM  10/18/2023   Colonoscopy   02/11/2024   Health Maintenance Items Addressed: Declines covid vaccines. Has an eye appointment coming up.  Additional Screening:  Vision Screening: Recommended annual ophthalmology exams for early detection of glaucoma and other disorders of the eye. Would you like a referral to an eye doctor? No    Dental Screening: Recommended annual dental exams for proper oral hygiene  Community Resource Referral / Chronic Care Management: CRR required this visit?  No   CCM required this visit?  No   Plan:    I have personally reviewed and noted the following in the patient's chart:   Medical and social history Use of alcohol, tobacco or illicit drugs  Current medications and supplements including opioid prescriptions. Patient is not currently taking opioid prescriptions. Functional ability and status Nutritional status Physical activity Advanced directives List of other physicians Hospitalizations, surgeries, and ER visits in previous 12 months Vitals Screenings to include cognitive, depression, and falls Referrals and appointments  In addition, I have reviewed and discussed with patient certain preventive protocols, quality metrics, and best practice recommendations. A written personalized care plan for preventive services as well as general preventive health recommendations were provided to patient.   Ardella FORBES Dawn, LPN   [id number]   After Visit Summary: (In Person-Declined) Patient declined AVS at this time.  Notes: Nothing significant to report at this time.

## 2023-11-10 NOTE — Patient Instructions (Signed)
 Kim Castro , Thank you for taking time out of your busy schedule to complete your Annual Wellness Visit with me. I enjoyed our conversation and look forward to speaking with you again next year. I, as well as your care team,  appreciate your ongoing commitment to your health goals. Please review the following plan we discussed and let me know if I can assist you in the future. Your Game plan/ To Do List    Referrals: If you haven't heard from the office you've been referred to, please reach out to them at the phone provided.  N/a Follow up Visits: Next Medicare AWV with our clinical staff: 11/11/2024 at 8:10   Have you seen your provider in the last 6 months (3 months if uncontrolled diabetes)? Yes Next Office Visit with your provider: 12/13/2023 at 8:20  Clinician Recommendations:  Aim for 30 minutes of exercise or brisk walking, 6-8 glasses of water , and 5 servings of fruits and vegetables each day.       This is a list of the screening recommended for you and due dates:  Health Maintenance  Topic Date Due   COVID-19 Vaccine (4 - 2024-25 season) 01/01/2023   Eye exam for diabetics  10/18/2023   Colon Cancer Screening  02/11/2024   Complete foot exam   11/15/2023   Flu Shot  12/01/2023   Hemoglobin A1C  03/14/2024   Yearly kidney health urinalysis for diabetes  09/11/2024   Yearly kidney function blood test for diabetes  10/01/2024   Medicare Annual Wellness Visit  11/09/2024   DTaP/Tdap/Td vaccine (3 - Td or Tdap) 07/10/2028   Pneumococcal Vaccine for age over 45  Completed   DEXA scan (bone density measurement)  Completed   Hepatitis C Screening  Completed   Zoster (Shingles) Vaccine  Completed   Hepatitis B Vaccine  Aged Out   HPV Vaccine  Aged Out   Meningitis B Vaccine  Aged Out    Advanced directives: (In Chart) A copy of your advanced directives are scanned into your chart should your provider ever need it. Advance Care Planning is important because it:  [x]  Makes sure you  receive the medical care that is consistent with your values, goals, and preferences  [x]  It provides guidance to your family and loved ones and reduces their decisional burden about whether or not they are making the right decisions based on your wishes.  Follow the link provided in your after visit summary or read over the paperwork we have mailed to you to help you started getting your Advance Directives in place. If you need assistance in completing these, please reach out to us  so that we can help you!  See attachments for Preventive Care and Fall Prevention Tips.

## 2023-11-11 ENCOUNTER — Other Ambulatory Visit: Payer: Self-pay

## 2023-11-12 NOTE — Assessment & Plan Note (Signed)
 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 Path showed overall Stage IIIB cancer.  -s/p 6 months adjuvant FOLFOX, completed 06/01/20, oxaliplatin  held for final 2 cycles due to neuropathy.  -s/p BSO and hysterectomy on 12/22/21 with Dr. Viktoria. Path revealed metastatic moderately differentiated colonic adenocarcinoma involving both ovaries and peritoneum -she restarted FOLFOX on 01/24/22. -Due to disease progression, chemotherapy changed to second line FOLFIRI and bevacizumab  on April 06, 2022. She has been tolerating moderately well, better now with dose reduction, will continue -Restaging CT scan from 09/27/2022 showed continous response in liver and peritoneal metastasis, no other new lesions.   -We discussed maintenance therapy option in future  -due to her PE in 10/2022, will hold beva for 3 months  -restaging CT 12/31/2022 showed stable disease and live met is not well defined on CT. She restarted beva on 01/17/23  -restaging CT 06/19/2023 showed overall stable disease (stable small lung nodules, no other evidence of cancer) Restaging CT 09/12/2023 showed no residual radiographic evidence of disease.   - We discussed option of maintenance therapy, treatment changed to capecitabine  and bevacizumab  on 10/02/2023

## 2023-11-13 ENCOUNTER — Inpatient Hospital Stay: Attending: Hematology

## 2023-11-13 ENCOUNTER — Inpatient Hospital Stay: Admitting: Hematology

## 2023-11-13 ENCOUNTER — Inpatient Hospital Stay

## 2023-11-13 VITALS — BP 130/58 | HR 60 | Temp 97.9°F | Resp 16 | Ht 65.5 in | Wt 206.8 lb

## 2023-11-13 DIAGNOSIS — C182 Malignant neoplasm of ascending colon: Secondary | ICD-10-CM | POA: Diagnosis not present

## 2023-11-13 DIAGNOSIS — C7963 Secondary malignant neoplasm of bilateral ovaries: Secondary | ICD-10-CM | POA: Diagnosis not present

## 2023-11-13 DIAGNOSIS — C786 Secondary malignant neoplasm of retroperitoneum and peritoneum: Secondary | ICD-10-CM | POA: Diagnosis not present

## 2023-11-13 DIAGNOSIS — Z5111 Encounter for antineoplastic chemotherapy: Secondary | ICD-10-CM | POA: Insufficient documentation

## 2023-11-13 DIAGNOSIS — C787 Secondary malignant neoplasm of liver and intrahepatic bile duct: Secondary | ICD-10-CM | POA: Insufficient documentation

## 2023-11-13 DIAGNOSIS — Z95828 Presence of other vascular implants and grafts: Secondary | ICD-10-CM

## 2023-11-13 DIAGNOSIS — D649 Anemia, unspecified: Secondary | ICD-10-CM | POA: Diagnosis not present

## 2023-11-13 LAB — CBC WITH DIFFERENTIAL (CANCER CENTER ONLY)
Abs Immature Granulocytes: 0.01 K/uL (ref 0.00–0.07)
Basophils Absolute: 0 K/uL (ref 0.0–0.1)
Basophils Relative: 1 %
Eosinophils Absolute: 0.3 K/uL (ref 0.0–0.5)
Eosinophils Relative: 6 %
HCT: 30.7 % — ABNORMAL LOW (ref 36.0–46.0)
Hemoglobin: 10.2 g/dL — ABNORMAL LOW (ref 12.0–15.0)
Immature Granulocytes: 0 %
Lymphocytes Relative: 35 %
Lymphs Abs: 1.9 K/uL (ref 0.7–4.0)
MCH: 34.3 pg — ABNORMAL HIGH (ref 26.0–34.0)
MCHC: 33.2 g/dL (ref 30.0–36.0)
MCV: 103.4 fL — ABNORMAL HIGH (ref 80.0–100.0)
Monocytes Absolute: 0.5 K/uL (ref 0.1–1.0)
Monocytes Relative: 10 %
Neutro Abs: 2.6 K/uL (ref 1.7–7.7)
Neutrophils Relative %: 48 %
Platelet Count: 152 K/uL (ref 150–400)
RBC: 2.97 MIL/uL — ABNORMAL LOW (ref 3.87–5.11)
RDW: 18 % — ABNORMAL HIGH (ref 11.5–15.5)
WBC Count: 5.5 K/uL (ref 4.0–10.5)
nRBC: 0 % (ref 0.0–0.2)

## 2023-11-13 LAB — COMPREHENSIVE METABOLIC PANEL WITH GFR
ALT: 26 U/L (ref 0–44)
AST: 37 U/L (ref 15–41)
Albumin: 3.8 g/dL (ref 3.5–5.0)
Alkaline Phosphatase: 108 U/L (ref 38–126)
Anion gap: 5 (ref 5–15)
BUN: 25 mg/dL — ABNORMAL HIGH (ref 8–23)
CO2: 25 mmol/L (ref 22–32)
Calcium: 9.5 mg/dL (ref 8.9–10.3)
Chloride: 111 mmol/L (ref 98–111)
Creatinine, Ser: 0.72 mg/dL (ref 0.44–1.00)
GFR, Estimated: >60 mL/min (ref >60)
Glucose, Bld: 103 mg/dL — ABNORMAL HIGH (ref 70–99)
Potassium: 4.3 mmol/L (ref 3.5–5.1)
Sodium: 141 mmol/L (ref 135–145)
Total Bilirubin: 1.3 mg/dL — ABNORMAL HIGH (ref 0.0–1.2)
Total Protein: 6.2 g/dL — ABNORMAL LOW (ref 6.5–8.1)

## 2023-11-13 LAB — CEA (ACCESS): CEA (CHCC): 11.26 ng/mL — ABNORMAL HIGH (ref 0.00–5.00)

## 2023-11-13 LAB — TOTAL PROTEIN, URINE DIPSTICK: Protein, ur: NEGATIVE mg/dL

## 2023-11-13 MED ORDER — SODIUM CHLORIDE 0.9% FLUSH
10.0000 mL | INTRAVENOUS | Status: DC | PRN
  Administered 2023-11-13: 10 mL

## 2023-11-13 MED ORDER — SODIUM CHLORIDE 0.9 % IV SOLN
7.5000 mg/kg | Freq: Once | INTRAVENOUS | Status: AC
  Administered 2023-11-13: 700 mg via INTRAVENOUS
  Filled 2023-11-13: qty 12

## 2023-11-13 MED ORDER — SODIUM CHLORIDE 0.9 % IV SOLN
INTRAVENOUS | Status: DC
Start: 2023-11-13 — End: 2023-11-13

## 2023-11-13 NOTE — Patient Instructions (Signed)
 [facility] - A DEPT OF [facility]  Discharge Instructions: Thank you for choosing [facility] to provide your oncology and hematology care.   If you have a lab appointment with the Cancer Center, please go directly to the Cancer Center and check in at the registration area.   Wear comfortable clothing and clothing appropriate for easy access to any Portacath or PICC line.   We strive to give you quality time with your provider. You may need to reschedule your appointment if you arrive late (15 or more minutes).  Arriving late affects you and other patients whose appointments are after yours.  Also, if you miss three or more appointments without notifying the office, you may be dismissed from the clinic at the provider's discretion.      For prescription refill requests, have your pharmacy contact our office and allow 72 hours for refills to be completed.    Today you received the following chemotherapy and/or immunotherapy agents: Bevacizumab      To help prevent nausea and vomiting after your treatment, we encourage you to take your nausea medication as directed.  BELOW ARE SYMPTOMS THAT SHOULD BE REPORTED IMMEDIATELY: *FEVER GREATER THAN 100.4 F (38 C) OR HIGHER *CHILLS OR SWEATING *NAUSEA AND VOMITING THAT IS NOT CONTROLLED WITH YOUR NAUSEA MEDICATION *UNUSUAL SHORTNESS OF BREATH *UNUSUAL BRUISING OR BLEEDING *URINARY PROBLEMS (pain or burning when urinating, or frequent urination) *BOWEL PROBLEMS (unusual diarrhea, constipation, pain near the anus) TENDERNESS IN MOUTH AND THROAT WITH OR WITHOUT PRESENCE OF ULCERS (sore throat, sores in mouth, or a toothache) UNUSUAL RASH, SWELLING OR PAIN  UNUSUAL VAGINAL DISCHARGE OR ITCHING   Items with * indicate a potential emergency and should be followed up as soon as possible or go to the Emergency Department if any problems should occur.  Please show the CHEMOTHERAPY ALERT CARD or  IMMUNOTHERAPY ALERT CARD at check-in to the Emergency Department and triage nurse.  Should you have questions after your visit or need to cancel or reschedule your appointment, please contact [facility]  Dept: [phone]  and follow the prompts.  Office hours are 8:00 a.m. to 4:30 p.m. Monday - Friday. Please note that voicemails left after 4:00 p.m. may not be returned until the following business day.  We are closed weekends and major holidays. You have access to a nurse at all times for urgent questions. Please call the main number to the clinic Dept: [phone] and follow the prompts.   For any non-urgent questions, you may also contact your provider using MyChart. We now offer e-Visits for anyone 67 and older to request care online for non-urgent symptoms. For details visit [email or web address].   Also download the MyChart app! Go to the app store, search "MyChart", open the app, select [place], and log in with your MyChart username and password.

## 2023-11-13 NOTE — Progress Notes (Signed)
 [facility]   Telephone:([phone]) [phone] Fax:([phone]   Clinic Follow up Note   Patient Care Team: [name], MD as PCP - General (Family Medicine) [name], MD as Consulting Physician (General Surgery) [name], MD as Consulting Physician (Hematology) [name], MD as Consulting Physician (General Surgery) [name], MD as Consulting Physician (Hematology and Oncology) [facility], [facility]   Date of Service:  11/13/2023  CHIEF COMPLAINT: f/u of metastatic  CURRENT THERAPY:  Capecitabine  1 week on 1 week off, bevacizumab  every 3 weeks  Oncology History   Malignant neoplasm of ascending colon (HCC) pT3N2aM0 stage IIB, MSS, metastatic to b/l ovaries and peritoneum and liver, KRAS mutation G12V (+)  -Initially diagnosed in 08/2019, s/p resection with Dr Debby on 11/01/19. Path showed overall Stage IIIB cancer.  -s/p 6 months adjuvant FOLFOX, completed 06/01/20, oxaliplatin  held for final 2 cycles due to neuropathy.  -s/p BSO and hysterectomy on 12/22/21 with Dr. Viktoria. Path revealed metastatic moderately differentiated colonic adenocarcinoma involving both ovaries and peritoneum -she restarted FOLFOX on 01/24/22. -Due to disease progression, chemotherapy changed to second line FOLFIRI and bevacizumab  on April 06, 2022. She has been tolerating moderately well, better now with dose reduction, will continue -Restaging CT scan from 09/27/2022 showed continous response in liver and peritoneal metastasis, no other new lesions.   -We discussed maintenance therapy option in future  -due to her PE in 10/2022, will hold beva for 3 months  -restaging CT 12/31/2022 showed stable disease and live met is not well defined on CT. She restarted beva on 01/17/23  -restaging CT 06/19/2023 showed overall stable disease (stable small lung nodules, no other evidence of cancer) Restaging CT 09/12/2023 showed no residual radiographic evidence of disease.    - We discussed option of maintenance therapy, treatment changed to capecitabine  and bevacizumab  on 10/02/2023  Assessment & Plan Cancer under active treatment Undergoing treatment with oral chemotherapy and bevacizumab  infusions every three weeks. Oral chemotherapy is preferred over IV due to the absence of a pump. Experiences fatigue and occasional dyspnea, likely due to heat and humidity rather than previous blood clots. Follows a regimen of four tablets twice a day for seven days on and seven days off, with diarrhea occurring three days after stopping the medication. Blood tests show normal leukocyte count, mild anemia, and negative urine protein. Pending kidney and liver function tests and tumor markers are expected to be stable. - Continue bevacizumab  infusions every three weeks - Schedule next two treatments - Repeat scan in mid-August - Monitor blood counts and tumor markers  Mild anemia Mild anemia noted on recent blood tests. It is not worsening and is being monitored as part of her cancer treatment.  Plan - Patient is tolerating treatment well overall we will continue at the same dose - Will proceed bevacizumab  today and continue every 3 weeks - Plan to repeat a CT scan around August 18 - Up in 3 weeks   SUMMARY OF ONCOLOGIC HISTORY: Oncology History Overview Note   Cancer Staging  Malignant neoplasm of female breast [facility]) Staging form: Breast, AJCC 7th Edition - Clinical stage from 06/11/2014: Stage 0 (Tis (DCIS), N0, M0) - Unsigned Staged by: Pathologist and managing physician Laterality: Left Estrogen receptor status: Positive Progesterone receptor status: Positive Stage used in treatment planning: Yes National guidelines used in treatment planning: Yes Type of national guideline used in treatment planning: NCCN - Pathologic stage from 07/03/2014: Stage Unknown (Tis (DCIS), NX, cM0) - Signed by  Guinevere Chew, MD on 07/10/2014 Staged by: Pathologist Laterality:  Left Estrogen receptor status: Positive Progesterone receptor status: Positive Stage used in treatment planning: Yes National guidelines used in treatment planning: Yes Type of national guideline used in treatment planning: NCCN Staging comments: Staged on final lumpectomy specimen by Dr. Loral.  Right colon cancer Staging form: Colon and Rectum, AJCC 8th Edition - Pathologic stage from 11/01/2019: Stage IIIB (pT3, pN2a, cM0) - Signed by [name], MD on 11/29/2019 Stage prefix: Initial diagnosis Histologic grading system: 4 grade system Histologic grade (G): G2 Residual tumor (R): R0 - None Tumor deposits (TD): Absent Perineural invasion (PNI): Absent Microsatellite instability (MSI): Stable KRAS mutation: Unknown NRAS mutation: Unknown BRAF mutation: Unknown     Malignant neoplasm of female breast (HCC)  05/29/2014 Initial Biopsy   Left breast needle core biopsy: Grade 2, DCIS with calcs. ER+ (100%), PR+ (96%).    06/04/2014 Initial Diagnosis   Left breast DCIS with calcifications, ER 100%, PR 96%   06/10/2014 Breast MRI   Left breast: 2.4 x 1.3 x 1.1 cm area of patchy non-mass enhancement upper outer quadrant includes postbiopsy seroma; Right breast: 1.2 cm previously biopsied stable benign fibroadenoma   06/12/2014 Procedure   Genetic counseling/testing: Identified 1 VUS on CHEK2 gene. Remainder of 17 gene panel tested negative and included: ATM, BARD1, BRCA 1/2, BRIP1, CDH1, CHEK2, EPCAM, MLH1, MSH2, MSH6, NBN, NF1, PALB2, PTEN, RAD50, RAD51C, RAD51D, STK11, and TP53.    07/01/2014 Surgery   Left breast lumpectomy ([facility]): Grade 1, DCIS, spanning 2.3 cm, 1 mm margin, ER 100%, PR 96%   07/31/2014 - 08/28/2014 Radiation Therapy   Adjuvant RT completed Signe). Left breast: Total dose 42.5 Gy over 17 fractions. Left breast boost: Total dose 7.5 Gy over 3 fractions.    09/14/2014 - 01/13/2020 Anti-estrogen oral therapy   Anastrazole 1mg  daily. Planned duration of treatment: 5  years [place]). Completed in 01/2020.    09/25/2014 Survivorship   Survivorship Care Plan given to patient and reviewed with her in person.    03/02/2021 Imaging   CT CAP  IMPRESSION: 1. No findings of active/recurrent malignancy. Partial right hemicolectomy. 2. Endometrial stripe remains mildly thickened, but endometrial biopsy in May was negative for malignancy. 3. Progressive endplate sclerosis and endplate irregularity at T2-3, probably due to degenerative endplate findings. If the has referable upper thoracic pain/symptoms then thoracic spine MRI could be used for further workup. 4. Other imaging findings of potential clinical significance: Mild cardiomegaly. Aortic Atherosclerosis (ICD10-I70.0). Mild mitral valve calcification. Postoperative findings in the left breast with adjacent radiation port anteriorly in the left upper lobe. Tiny pulmonary nodules in the left lower lobe are unchanged from earliest available comparison of 10/17/2019 and probably benign although may merit surveillance. Multilevel lumbar impingement. Mild pelvic floor laxity.   Malignant neoplasm of ascending colon (HCC)  09/19/2019 Imaging   CT AP W contrast 09/19/19  IMPRESSION Fullness in the cecum, cannot exclude a mass. No evidence for metastatic disease is identified.    09/24/2019 Procedure   Colonoscopy by Dr Ernesto 09/24/19 IMPRESSION 1. The colon was redundant  2. Mild diverticulosis was noted through the entire examined colon 3. Single 12mm polyp was found in the ascending colon; polypectomy was performed using snare cautery and biopsy forceps 4. Mild diverticulosis was notes in the descending colon and sigmoid colon.  5. Single polyp was found in the sigmoid colon, polypectomy was performed with cold forceps.  6. Single polyp was found in the rectosigmoid colon; polypectomy  was performed with cold snare  7. Small internal hemorrhoids  8. Large mass was found at the cecum; multiple biopsies of the  area were performed using cold forceps; injection (tattooing) was performed distal to the mass.    09/24/2019 Initial Biopsy   INTERPRETATION AND DIAGNOSIS:  A. Cecum, biopsy:  Invasive moderately differentiated adenocarcinoma.  see comment  B. Polyp @ ascending colon, polypectomy:  Tubular Adenoma  C. Polyp @ sigmoid colon Polypectomy:  hyperplastic polyp.  D. Polyp @ rectosigmoid colon, Polypectomy:  Hyperplastic Polyp      10/16/2019 Imaging   CT Chest IMPRESSION: 1. Multiple small pulmonary nodules measuring 5 mm or less in size in the lungs. These are nonspecific and are typically considered statistically likely benign. However, given the patient's history of primary malignancy, close attention on follow-up studies is recommended to ensure stability. 2. Aortic atherosclerosis, in addition to right coronary artery disease. Assessment for potential risk factor modification, dietary therapy or pharmacologic therapy may be warranted, if clinically indicated. 3. There are calcifications of the aortic valve and mitral annulus. Echocardiographic correlation for evaluation of potential valvular dysfunction may be warranted if clinically indicated. 4. Small hiatal hernia.   Aortic Atherosclerosis (ICD10-I70.0).   11/01/2019 Initial Diagnosis   Colon cancer (HCC)   11/01/2019 Surgery   LAPAROSCOPIC PARTIAL COLECTOMY by Dr Debby and Dr Sheldon   11/01/2019 Pathology Results   FINAL MICROSCOPIC DIAGNOSIS:   A. COLON, PROXIMAL RIGHT, COLECTOMY:  - Invasive colonic adenocarcinoma, 5 cm.  - Tumor invades through the muscularis propria into pericolonic tissues.   - Margins of resection are not involved.  - Metastatic carcinoma in (5) of (13) lymph nodes.  - See oncology table.    MSI Stable  Mismatch repair normal  MLH1 - Preserved nuclear expression (greater 50% tumor expression) MSH2 - Preserved nuclear expression (greater 50% tumor expression) MSH6 - Preserved nuclear  expression (greater 50% tumor expression) PMS2 - Preserved nuclear expression (greater 50% tumor expression)   11/01/2019 Cancer Staging   Staging form: Colon and Rectum, AJCC 8th Edition - Pathologic stage from 11/01/2019: Stage IIIB (pT3, pN2a, cM0) - Signed by [name], MD on 11/29/2019   12/10/2019 Procedure   PAC placed 12/10/19   12/17/2019 - 06/01/2020 Chemotherapy   FOLFOX q2weeks starting in 2 weeks starting 12/17/19. Held 01/27/20-02/10/20 due to b/l PE. Oxaliplatin  held C11-12 due to neuropathy. Completed on 06/01/20   03/02/2021 Imaging   CT CAP  IMPRESSION: 1. No findings of active/recurrent malignancy. Partial right hemicolectomy. 2. Endometrial stripe remains mildly thickened, but endometrial biopsy in May was negative for malignancy. 3. Progressive endplate sclerosis and endplate irregularity at T2-3, probably due to degenerative endplate findings. If the has referable upper thoracic pain/symptoms then thoracic spine MRI could be used for further workup. 4. Other imaging findings of potential clinical significance: Mild cardiomegaly. Aortic Atherosclerosis (ICD10-I70.0). Mild mitral valve calcification. Postoperative findings in the left breast with adjacent radiation port anteriorly in the left upper lobe. Tiny pulmonary nodules in the left lower lobe are unchanged from earliest available comparison of 10/17/2019 and probably benign although may merit surveillance. Multilevel lumbar impingement. Mild pelvic floor laxity.   08/26/2021 Imaging   EXAM: CT CHEST, ABDOMEN, AND PELVIS WITH CONTRAST  IMPRESSION: 1. Stable examination without new or progressive findings to suggest local recurrence or metastatic disease within the chest, abdomen, or pelvis. 2. Hepatomegaly with hepatic steatosis. 3. Sigmoid colonic diverticulosis without findings of acute diverticulitis. 4. Similar prominent endplate sclerosis and irregularity at T2-T3  is most consistent with Modic type endplate  degenerative changes. However, if patient has referable upper thoracic pain consider further workup with thoracic spine MRI. 5. Similar thickening of the endometrial stripe measuring up to 8 mm, which was previously biopsied with results negative for malignancy. 6. Aortic Atherosclerosis (ICD10-I70.0).   11/10/2021 PET scan   IMPRESSION: 1. LEFT ovary is increased in size and is intensely hypermetabolic. While physiologic hypermetabolic ovarian tissue is not uncommon, the enlargement and asymmetric activity warrants further evaluation. Consider contrast pelvic MRI vs tissue sampling. 2. No evidence of metastatic colorectal carcinoma otherwise. 3. Post RIGHT hemicolectomy anatomy. 4. Evidence of radiation change in the LEFT upper lobe (remote breast cancer).     12/22/2021 Relapse/Recurrence    FINAL MICROSCOPIC DIAGNOSIS:   A. LEFT OVARY AND FALLOPIAN TUBE, SALPINGO OOPHORECTOMY:  - Metastatic moderately differentiated colonic adenocarcinoma involving left ovary  - Focal ovarian stromal calcification  - Segment of benign fallopian tube   B. UTERUS WITH RIGHT FALLOPIAN TUBE AND OVARY, HYSTERECTOMY AND SALPINGO-OOPHORECTOMY:  - Metastatic moderately differentiated colonic adenocarcinoma involving right ovary  - Focal invasive extensive adenomyosis  - Benign endometrial polyps  - Benign proliferative phase endometrium  - Hydrosalpinx of right fallopian tube  - Focal ovarian stromal calcification   C. PERITONEAL DEPOSITS, ANTERIOR CUL DE SAC, BIOPSY:  - Metastatic mucinous adenocarcinoma, consistent with colorectal primary    COMMENT:  Immunohistochemical stains show that the tumor cells are positive for CK20 and CDX2 while they are negative for CK7 and PAX8, consistent with above interpretation.    01/24/2022 - 03/23/2022 Chemotherapy   Patient is on Treatment Plan : COLORECTAL FOLFOX q14d x 3 months     04/01/2022 Imaging   CT AP IMPRESSION: 1. New omental metastatic  disease. 2. Vague hypoattenuating lesion in the inferior right hepatic lobe is new and also worrisome for metastatic disease. 3. Similar small right lower lobe nodules. Recommend continued attention on follow-up. 4. Steatotic enlarged liver. 5.  Aortic atherosclerosis (ICD10-I70.0).   04/06/2022 - 09/18/2023 Chemotherapy   Patient is on Treatment Plan : COLORECTAL FOLFIRI + Bevacizumab  q14d     04/21/2022 Imaging    IMPRESSION: 1. Stable chest CT. No evidence of metastatic disease. 2. Stable small pulmonary nodules, considered benign based on stability. 3. Stable postsurgical changes in the left breast and anterior left upper lobe radiation changes. 4. Aortic Atherosclerosis (ICD10-I70.0) and Emphysema (ICD10-J43.9).   04/21/2022 Imaging    IMPRESSION: 1. Small enhancing lesion inferiorly in the right hepatic lobe is typical of metastatic disease and stable from recent abdominal CT. No other definite liver lesions are identified. Mild hepatic steatosis. 2. No other significant abdominal findings. 3. Omental nodularity seen on CT is not well visualized by MRI.    Imaging     06/23/2022 Imaging    IMPRESSION: 1. Hypodense lesion of the inferior right lobe of the liver, hepatic segment VI is diminished in size, consistent with treatment response. 2. Tiny peritoneal and omental nodules identified by prior examination are diminished in size, consistent with treatment response. 3. Multiple tiny pulmonary nodules unchanged, most likely benign and incidental, however continued attention on follow-up warranted in the setting of known metastatic disease. 4. No evidence of new metastatic disease in the chest, abdomen, or pelvis. 5. Status post partial right hemicolectomy and reanastomosis. 6. Diffuse mosaic attenuation of the airspaces, consistent with small airways disease. 7. Hepatomegaly.   10/02/2023 -  Chemotherapy   Patient is on Treatment Plan : COLORECTAL Bevacizumab  q21d  Discussed the use of AI scribe software for clinical note transcription with the patient, who gave verbal consent to proceed.  History of Present Illness Kim Castro is a 78 year old female with cancer who presents for a follow-up visit regarding her chemotherapy treatment.  She experiences skin sensitivity in her hands, describing the skin as feeling thinner and more sensitive. This does not significantly impact her daily activities as long as she avoids extreme temperatures. She has numbness in her body and feet, with the ability to feel cold but not much else.  She is currently on oral chemotherapy, taking four tablets twice a day for seven days on, followed by seven days off. She finds the oral chemotherapy easier than the IV as it does not require a pump. She experiences fatigue, which she attributes to the heat, and notes that she gets tired more quickly when doing activities.  When she is on the oral chemotherapy, she does not experience diarrhea, but it returns three days after stopping the medication.     All other systems were reviewed with the patient and are negative.  MEDICAL HISTORY:  Past Medical History:  Diagnosis Date   Aortic atherosclerosis (HCC)    Arthritis    feet, lower back   Basal cell carcinoma    arm   BRCA gene mutation negative    Breast cancer of upper-outer quadrant of left female breast (HCC) 06/04/2014   Cataract    immature on the left   Colon cancer (HCC) 08/2019   Diabetes mellitus without complication (HCC)    Diverticulosis    Dizziness    > 73yrs ago;took Antivert    Family history of anesthesia complication    sister slow to wake up with anesthesia   Family history of breast cancer    Family history of colon cancer    Family history of uterine cancer    GERD (gastroesophageal reflux disease)    takes occasional TUMs   History of bronchitis    > 59yrs ago   History of colon polyps    History of hiatal hernia    Small  noted on CT   History of pulmonary embolus (PE)    Hypertension    takes Losartan  daily and HCTZ   Iron deficiency anemia    Joint pain    Metastatic disease (HCC) 2023   peritoneum and liver mets   Numbness    to toes on each foot   Peripheral neuropathy    feet and toes   Personal history of radiation therapy    Pulmonary nodules    Noted on CT   Radiation 07/31/14-08/28/14   Left Breast 20 fxs   Seasonal allergies    takes Claritin prn   Urinary frequency    Vitamin D deficiency    takes VIt D daily    SURGICAL HISTORY: Past Surgical History:  Procedure Laterality Date   BREAST BIOPSY Bilateral    BREAST LUMPECTOMY Left    BREAST LUMPECTOMY WITH RADIOACTIVE SEED LOCALIZATION Left 07/01/2014   Procedure: LEFT BREAST LUMPECTOMY WITH RADIOACTIVE SEED LOCALIZATION;  Surgeon: Morene Olives, MD;  Location: [facility];  Service: General;  Laterality: Left;   CATARACT EXTRACTION Right    COLONOSCOPY  09/24/2019   [place]   LAPAROSCOPIC PARTIAL COLECTOMY N/A 11/01/2019   Procedure: LAPAROSCOPIC PARTIAL COLECTOMY;  Surgeon: [name], MD;  Location: [facility];  Service: General;  Laterality: N/A;   POLYPECTOMY     PORTACATH PLACEMENT N/A  12/10/2019   Procedure: INSERTION PORT-A-CATH ULTRASOUND GUIDED IN RIGHT IJ;  Surgeon: [name], MD;  Location: [facility];  Service: General;  Laterality: N/A;   ROBOTIC ASSISTED TOTAL HYSTERECTOMY Bilateral 12/22/2021   Procedure: XI ROBOTIC ASSISTED TOTAL HYSTERECTOMY WITH BILATERAL SALPINGO OOPHORECTOMY;  Surgeon: Viktoria Comer SAUNDERS, MD;  Location: [facility];  Service: Gynecology;  Laterality: Bilateral;   TOTAL KNEE ARTHROPLASTY Left 10/24/2012   Procedure: TOTAL KNEE ARTHROPLASTY;  Surgeon: Dempsey JINNY Sensor, MD;  Location: [facility];  Service: Orthopedics;  Laterality: Left;   TOTAL KNEE ARTHROPLASTY Right 01/09/2013   Procedure: TOTAL KNEE ARTHROPLASTY;  Surgeon: Dempsey JINNY Sensor, MD;  Location: [facility];  Service: Orthopedics;   Laterality: Right;   TUBAL LIGATION      I have reviewed the social history and family history with the patient and they are unchanged from previous note.  ALLERGIES:  is allergic to oxycodone .  MEDICATIONS:  Current Outpatient Medications  Medication Sig Dispense Refill   b complex vitamins capsule Take 1 capsule by mouth daily.     capecitabine  (XELODA ) 500 MG tablet Take 4 tablets (2,000 mg total) by mouth 2 (two) times daily after a meal. Take within 30 minutes after meals. Take for 7 days on and 7 days off, then repeat. 112 tablet 2   Cholecalciferol (VITAMIN D) 2000 UNITS CAPS Take 2,000 Units by mouth daily.      ELIQUIS  5 MG TABS tablet Take 1 tablet by mouth twice daily 60 tablet 0   famotidine (PEPCID) 20 MG tablet Take 20 mg by mouth daily.     ibuprofen (ADVIL) 200 MG tablet Take 200 mg by mouth daily as needed (pain).     lidocaine -prilocaine  (EMLA ) cream Apply 1 Application topically as needed. 30 g 1   loratadine (CLARITIN) 10 MG tablet Take 10 mg by mouth daily as needed for allergies.     losartan  (COZAAR ) 25 MG tablet Take 1 tablet by mouth once daily 90 tablet 2   metFORMIN  (GLUCOPHAGE ) 500 MG tablet Take 1 tablet (500 mg total) by mouth in the morning and at bedtime. 180 tablet 3   Multiple Vitamins-Minerals (MULTIVITAMIN WITH MINERALS) tablet Take 1 tablet by mouth daily.     omeprazole (PRILOSEC) 20 MG capsule Take 20 mg by mouth daily before breakfast.  (Patient not taking: Reported on 11/10/2023)     ondansetron  (ZOFRAN ) 8 MG tablet Take 1 tablet (8 mg total) by mouth every 8 (eight) hours as needed for nausea or vomiting. Start on the third day after chemotherapy. 30 tablet 1   potassium chloride  (KLOR-CON ) 10 MEQ tablet Take 1 tablet by mouth twice daily 60 tablet 0   pravastatin  (PRAVACHOL ) 10 MG tablet Take 1 tablet (10 mg total) by mouth daily. 90 tablet 3   pregabalin  (LYRICA ) 25 MG capsule TAKE 1 CAPSULE BY MOUTH ONCE DAILY IN  ADDITION  TO  50MG   AT  BEDTIME  30 capsule 0   pregabalin  (LYRICA ) 50 MG capsule Take 1 capsule (50 mg total) by mouth 2 (two) times daily. Take additional 25 mg at bedtime (for total of 75,mg at bedtime). 60 capsule 2   prochlorperazine  (COMPAZINE ) 10 MG tablet Take 1 tablet (10 mg total) by mouth every 6 (six) hours as needed for nausea or vomiting. 30 tablet 1   traMADol  (ULTRAM ) 50 MG tablet Take 1 tablet (50 mg total) by mouth every 6 (six) hours as needed. (Patient not taking: Reported on 11/10/2023) 30 tablet 0   triamcinolone   ointment (KENALOG ) 0.5 % Apply 1 Application topically 2 (two) times daily. (Patient not taking: Reported on 11/10/2023) 30 g 0   No current facility-administered medications for this visit.   Facility-Administered Medications Ordered in Other Visits  Medication Dose Route Frequency Provider Last Rate Last Admin   0.9 %  sodium chloride  infusion   Intravenous Continuous [name], MD   Stopped at 11/13/23 1000    PHYSICAL EXAMINATION: ECOG PERFORMANCE STATUS: 1 - Symptomatic but completely ambulatory  Vitals:   11/13/23 0844  BP: (!) 130/58  Pulse: 60  Resp: 16  Temp: 97.9 F (36.6 C)  SpO2: 97%   Wt Readings from Last 3 Encounters:  11/13/23 93.8 kg (206 lb 12.8 oz)  11/10/23 92.7 kg (204 lb 6.4 oz)  10/23/23 92.9 kg (204 lb 11.2 oz)     GENERAL:alert, no distress and comfortable SKIN: skin color, texture, turgor are normal, no rashes or significant lesions EYES: normal, Conjunctiva are pink and non-injected, sclera clear NECK: supple, thyroid normal size, non-tender, without nodularity LYMPH:  no palpable lymphadenopathy in the cervical, axillary  LUNGS: clear to auscultation and percussion with normal breathing effort HEART: regular rate & rhythm and no murmurs and no lower extremity edema ABDOMEN:abdomen soft, non-tender and normal bowel sounds Musculoskeletal:no cyanosis of digits and no clubbing  NEURO: alert & oriented x 3 with fluent speech, no focal motor/sensory  deficits  Physical Exam    LABORATORY DATA:  I have reviewed the data as listed    Latest Ref Rng & Units 11/13/2023    8:32 AM 10/23/2023    7:48 AM 10/02/2023    8:39 AM  CBC  WBC 4.0 - 10.5 K/uL 5.5  4.9  4.2   Hemoglobin 12.0 - 15.0 g/dL 89.7  89.4  9.6   Hematocrit 36.0 - 46.0 % 30.7  32.9  30.4   Platelets 150 - 400 K/uL 152  201  186         Latest Ref Rng & Units 11/13/2023    8:32 AM 10/02/2023    8:39 AM 09/18/2023    9:00 AM  CMP  Glucose 70 - 99 mg/dL 896  96  866   BUN 8 - 23 mg/dL 25  21  18    Creatinine 0.44 - 1.00 mg/dL 9.27  9.12  8.99   Sodium 135 - 145 mmol/L 141  142  142   Potassium 3.5 - 5.1 mmol/L 4.3  4.3  4.0   Chloride 98 - 111 mmol/L 111  115  111   CO2 22 - 32 mmol/L 25  21  24    Calcium  8.9 - 10.3 mg/dL 9.5  8.9  9.2   Total Protein 6.5 - 8.1 g/dL 6.2  6.4  6.4   Total Bilirubin 0.0 - 1.2 mg/dL 1.3  0.5  0.7   Alkaline Phos 38 - 126 U/L 108  140  141   AST 15 - 41 U/L 37  20  20   ALT 0 - 44 U/L 26  14  14        RADIOGRAPHIC STUDIES: I have personally reviewed the radiological images as listed and agreed with the findings in the report. No results found.    Orders Placed This Encounter  Procedures   CT CHEST ABDOMEN PELVIS W CONTRAST    Standing Status:   Future    Expected Date:   12/18/2023    Expiration Date:   11/12/2024    If indicated for the ordered procedure,  I authorize the administration of contrast media per Radiology protocol:   Yes    Does the patient have a contrast media/X-ray dye allergy?:   No    Preferred imaging location?:   [facility]    Release to patient:   Immediate    If indicated for the ordered procedure, I authorize the administration of oral contrast media per Radiology protocol:   Yes   All questions were answered. The patient knows to call the clinic with any problems, questions or concerns. No barriers to learning was detected. The total time spent in the appointment was 25 minutes, including  review of chart and various tests results, discussions about plan of care and coordination of care plan     Onita Mattock, MD 11/13/2023

## 2023-11-14 ENCOUNTER — Other Ambulatory Visit: Payer: Self-pay | Admitting: Nurse Practitioner

## 2023-11-14 ENCOUNTER — Other Ambulatory Visit: Payer: Self-pay | Admitting: Hematology

## 2023-11-15 ENCOUNTER — Encounter: Payer: Self-pay | Admitting: Hematology

## 2023-11-16 NOTE — Telephone Encounter (Unsigned)
 Copied from CRM [id number]. Topic: Clinical - Medication Question >> Nov 16, 2023 12:57 PM [place] J wrote: Reason for CRM: Fernande, a pharmacist from [place] is calling in regards to the patient's losartan  (COZAAR ) 25 MG tablet. She saw that this medication was last filled in march and was due for refill a last month. She would like to know if this medication has been discontinued and if so, please call her back with a definitive answer.   If still active, she would like for us  to reach out to the patient to see if she had any issues with her losartan  (COZAAR ) 25 MG tablet.  Najah's Callback: [phone] Ext. [phone]  ----------------------------------------------------------------------- From previous Reason for Contact - Prescription Issue: Reason for CRM:

## 2023-11-16 NOTE — Telephone Encounter (Signed)
 Called pharmacy and was advise that patient filled the Losartan  on 09/30/23 #90.  Then called Najah @ number provided and gave her the information.  Dm/cma

## 2023-11-16 NOTE — Telephone Encounter (Signed)
 Called patient notified VIA phone.  Dm/cma

## 2023-11-16 NOTE — Telephone Encounter (Signed)
 Copied from CRM [id number]. Topic: Clinical - Medication Question >> Nov 16, 2023  3:26 PM Chasity T wrote: Reason for CRM: Patient is calling back due to someone calling them. No notes were left to see who called but I did ask patient per request in notes if she was doing okay with the medication losartan  (COZAAR ) 25 MG tablet.. She states that she is doing fine and reacting well. She has enough to supply her for a while and if she needs more she will ask PCP at her appointment coming up.

## 2023-11-17 ENCOUNTER — Other Ambulatory Visit (HOSPITAL_COMMUNITY): Payer: Self-pay

## 2023-11-20 ENCOUNTER — Other Ambulatory Visit: Payer: Self-pay

## 2023-11-21 ENCOUNTER — Encounter: Payer: Self-pay | Admitting: Family Medicine

## 2023-11-21 DIAGNOSIS — H52223 Regular astigmatism, bilateral: Secondary | ICD-10-CM | POA: Diagnosis not present

## 2023-11-21 LAB — HM DIABETES EYE EXAM

## 2023-11-27 ENCOUNTER — Other Ambulatory Visit: Payer: Self-pay

## 2023-11-28 ENCOUNTER — Other Ambulatory Visit (HOSPITAL_COMMUNITY): Payer: Self-pay

## 2023-11-30 ENCOUNTER — Other Ambulatory Visit: Payer: Self-pay

## 2023-12-03 NOTE — Assessment & Plan Note (Signed)
 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 Path showed overall Stage IIIB cancer.  -s/p 6 months adjuvant FOLFOX, completed 06/01/20, oxaliplatin  held for final 2 cycles due to neuropathy.  -s/p BSO and hysterectomy on 12/22/21 with Dr. Viktoria. Path revealed metastatic moderately differentiated colonic adenocarcinoma involving both ovaries and peritoneum -she restarted FOLFOX on 01/24/22. -Due to disease progression, chemotherapy changed to second line FOLFIRI and bevacizumab  on April 06, 2022. She has been tolerating moderately well, better now with dose reduction, will continue -Restaging CT scan from 09/27/2022 showed continous response in liver and peritoneal metastasis, no other new lesions.   -We discussed maintenance therapy option in future  -due to her PE in 10/2022, will hold beva for 3 months  -restaging CT 12/31/2022 showed stable disease and live met is not well defined on CT. She restarted beva on 01/17/23  -restaging CT 06/19/2023 showed overall stable disease (stable small lung nodules, no other evidence of cancer) Restaging CT 09/12/2023 showed no residual radiographic evidence of disease.   - We discussed option of maintenance therapy, treatment changed to capecitabine  and bevacizumab  on 10/02/2023

## 2023-12-04 ENCOUNTER — Other Ambulatory Visit (HOSPITAL_COMMUNITY): Payer: Self-pay

## 2023-12-04 ENCOUNTER — Inpatient Hospital Stay

## 2023-12-04 ENCOUNTER — Other Ambulatory Visit: Payer: Self-pay

## 2023-12-04 ENCOUNTER — Ambulatory Visit: Admitting: Hematology

## 2023-12-04 ENCOUNTER — Inpatient Hospital Stay: Attending: Hematology

## 2023-12-04 VITALS — BP 124/60 | HR 66 | Temp 98.1°F | Resp 16 | Ht 65.5 in | Wt 209.0 lb

## 2023-12-04 DIAGNOSIS — R5383 Other fatigue: Secondary | ICD-10-CM | POA: Insufficient documentation

## 2023-12-04 DIAGNOSIS — G620 Drug-induced polyneuropathy: Secondary | ICD-10-CM | POA: Insufficient documentation

## 2023-12-04 DIAGNOSIS — T451X5A Adverse effect of antineoplastic and immunosuppressive drugs, initial encounter: Secondary | ICD-10-CM | POA: Insufficient documentation

## 2023-12-04 DIAGNOSIS — Z86000 Personal history of in-situ neoplasm of breast: Secondary | ICD-10-CM | POA: Insufficient documentation

## 2023-12-04 DIAGNOSIS — I10 Essential (primary) hypertension: Secondary | ICD-10-CM | POA: Diagnosis not present

## 2023-12-04 DIAGNOSIS — C787 Secondary malignant neoplasm of liver and intrahepatic bile duct: Secondary | ICD-10-CM | POA: Diagnosis not present

## 2023-12-04 DIAGNOSIS — Z853 Personal history of malignant neoplasm of breast: Secondary | ICD-10-CM | POA: Insufficient documentation

## 2023-12-04 DIAGNOSIS — Z7901 Long term (current) use of anticoagulants: Secondary | ICD-10-CM | POA: Insufficient documentation

## 2023-12-04 DIAGNOSIS — C182 Malignant neoplasm of ascending colon: Secondary | ICD-10-CM | POA: Diagnosis not present

## 2023-12-04 DIAGNOSIS — E876 Hypokalemia: Secondary | ICD-10-CM | POA: Insufficient documentation

## 2023-12-04 DIAGNOSIS — Z86718 Personal history of other venous thrombosis and embolism: Secondary | ICD-10-CM | POA: Insufficient documentation

## 2023-12-04 DIAGNOSIS — Z5111 Encounter for antineoplastic chemotherapy: Secondary | ICD-10-CM | POA: Insufficient documentation

## 2023-12-04 DIAGNOSIS — C786 Secondary malignant neoplasm of retroperitoneum and peritoneum: Secondary | ICD-10-CM | POA: Diagnosis not present

## 2023-12-04 DIAGNOSIS — C7963 Secondary malignant neoplasm of bilateral ovaries: Secondary | ICD-10-CM | POA: Insufficient documentation

## 2023-12-04 DIAGNOSIS — Z95828 Presence of other vascular implants and grafts: Secondary | ICD-10-CM

## 2023-12-04 DIAGNOSIS — R21 Rash and other nonspecific skin eruption: Secondary | ICD-10-CM | POA: Insufficient documentation

## 2023-12-04 DIAGNOSIS — D6481 Anemia due to antineoplastic chemotherapy: Secondary | ICD-10-CM | POA: Diagnosis not present

## 2023-12-04 LAB — CBC WITH DIFFERENTIAL (CANCER CENTER ONLY)
Abs Immature Granulocytes: 0.01 K/uL (ref 0.00–0.07)
Basophils Absolute: 0 K/uL (ref 0.0–0.1)
Basophils Relative: 1 %
Eosinophils Absolute: 0.2 K/uL (ref 0.0–0.5)
Eosinophils Relative: 4 %
HCT: 32 % — ABNORMAL LOW (ref 36.0–46.0)
Hemoglobin: 10.6 g/dL — ABNORMAL LOW (ref 12.0–15.0)
Immature Granulocytes: 0 %
Lymphocytes Relative: 41 %
Lymphs Abs: 1.7 K/uL (ref 0.7–4.0)
MCH: 35.2 pg — ABNORMAL HIGH (ref 26.0–34.0)
MCHC: 33.1 g/dL (ref 30.0–36.0)
MCV: 106.3 fL — ABNORMAL HIGH (ref 80.0–100.0)
Monocytes Absolute: 0.3 K/uL (ref 0.1–1.0)
Monocytes Relative: 7 %
Neutro Abs: 2 K/uL (ref 1.7–7.7)
Neutrophils Relative %: 47 %
Platelet Count: 143 K/uL — ABNORMAL LOW (ref 150–400)
RBC: 3.01 MIL/uL — ABNORMAL LOW (ref 3.87–5.11)
RDW: 18 % — ABNORMAL HIGH (ref 11.5–15.5)
WBC Count: 4.2 K/uL (ref 4.0–10.5)
nRBC: 0 % (ref 0.0–0.2)

## 2023-12-04 LAB — COMPREHENSIVE METABOLIC PANEL WITH GFR
ALT: 28 U/L (ref 0–44)
AST: 40 U/L (ref 15–41)
Albumin: 3.9 g/dL (ref 3.5–5.0)
Alkaline Phosphatase: 101 U/L (ref 38–126)
Anion gap: 6 (ref 5–15)
BUN: 20 mg/dL (ref 8–23)
CO2: 25 mmol/L (ref 22–32)
Calcium: 9.3 mg/dL (ref 8.9–10.3)
Chloride: 109 mmol/L (ref 98–111)
Creatinine, Ser: 0.68 mg/dL (ref 0.44–1.00)
GFR, Estimated: >60 mL/min (ref >60)
Glucose, Bld: 100 mg/dL — ABNORMAL HIGH (ref 70–99)
Potassium: 4.4 mmol/L (ref 3.5–5.1)
Sodium: 140 mmol/L (ref 135–145)
Total Bilirubin: 2 mg/dL — ABNORMAL HIGH (ref 0.0–1.2)
Total Protein: 6.2 g/dL — ABNORMAL LOW (ref 6.5–8.1)

## 2023-12-04 LAB — TOTAL PROTEIN, URINE DIPSTICK: Protein, ur: NEGATIVE mg/dL

## 2023-12-04 LAB — CEA (ACCESS): CEA (CHCC): 12.64 ng/mL — ABNORMAL HIGH (ref 0.00–5.00)

## 2023-12-04 MED ORDER — SODIUM CHLORIDE 0.9 % IV SOLN: INTRAVENOUS | Status: DC

## 2023-12-04 MED ORDER — SODIUM CHLORIDE 0.9 % IV SOLN
7.5000 mg/kg | Freq: Once | INTRAVENOUS | Status: AC
  Administered 2023-12-04: 700 mg via INTRAVENOUS
  Filled 2023-12-04: qty 12

## 2023-12-04 MED ORDER — SODIUM CHLORIDE 0.9% FLUSH
10.0000 mL | INTRAVENOUS | Status: DC | PRN
  Administered 2023-12-04: 10 mL

## 2023-12-04 MED ORDER — CAPECITABINE 500 MG PO TABS
ORAL_TABLET | ORAL | 0 refills | Status: DC
Start: 2023-12-04 — End: 2024-01-08
  Filled 2023-12-04 – 2023-12-13 (×3): qty 98, 28d supply, fill #0

## 2023-12-04 NOTE — Patient Instructions (Signed)
 [facility] - A DEPT OF [facility]  Discharge Instructions: Thank you for choosing [facility] to provide your oncology and hematology care.   If you have a lab appointment with the Cancer Center, please go directly to the Cancer Center and check in at the registration area.   Wear comfortable clothing and clothing appropriate for easy access to any Portacath or PICC line.   We strive to give you quality time with your provider. You may need to reschedule your appointment if you arrive late (15 or more minutes).  Arriving late affects you and other patients whose appointments are after yours.  Also, if you miss three or more appointments without notifying the office, you may be dismissed from the clinic at the provider's discretion.      For prescription refill requests, have your pharmacy contact our office and allow 72 hours for refills to be completed.    Today you received the following chemotherapy and/or immunotherapy agents: bevacizumab      To help prevent nausea and vomiting after your treatment, we encourage you to take your nausea medication as directed.  BELOW ARE SYMPTOMS THAT SHOULD BE REPORTED IMMEDIATELY: *FEVER GREATER THAN 100.4 F (38 C) OR HIGHER *CHILLS OR SWEATING *NAUSEA AND VOMITING THAT IS NOT CONTROLLED WITH YOUR NAUSEA MEDICATION *UNUSUAL SHORTNESS OF BREATH *UNUSUAL BRUISING OR BLEEDING *URINARY PROBLEMS (pain or burning when urinating, or frequent urination) *BOWEL PROBLEMS (unusual diarrhea, constipation, pain near the anus) TENDERNESS IN MOUTH AND THROAT WITH OR WITHOUT PRESENCE OF ULCERS (sore throat, sores in mouth, or a toothache) UNUSUAL RASH, SWELLING OR PAIN  UNUSUAL VAGINAL DISCHARGE OR ITCHING   Items with * indicate a potential emergency and should be followed up as soon as possible or go to the Emergency Department if any problems should occur.  Please show the CHEMOTHERAPY ALERT CARD or  IMMUNOTHERAPY ALERT CARD at check-in to the Emergency Department and triage nurse.  Should you have questions after your visit or need to cancel or reschedule your appointment, please contact [facility] - A DEPT [facility]  Dept: [phone]  and follow the prompts.  Office hours are 8:00 a.m. to 4:30 p.m. Monday - Friday. Please note that voicemails left after 4:00 p.m. may not be returned until the following business day.  We are closed weekends and major holidays. You have access to a nurse at all times for urgent questions. Please call the main number to the clinic Dept: [phone] and follow the prompts.   For any non-urgent questions, you may also contact your provider using MyChart. We now offer e-Visits for anyone 81 and older to request care online for non-urgent symptoms. For details visit [email or web address].   Also download the MyChart app! Go to the app store, search "MyChart", open the app, select [facility], and log in with your MyChart username and password.

## 2023-12-04 NOTE — Progress Notes (Signed)
 [facility]   Telephone:(336) [phone] Fax:(336) [phone]   Clinic Follow up Note   Patient Care Team: [name], MD as PCP - General (Family Medicine) [name], MD as Consulting Physician (General Surgery) [name], MD as Consulting Physician (Hematology) [name], MD as Consulting Physician (General Surgery) [name], MD as Consulting Physician (Hematology and Oncology) [facility], [facility]   Date of Service:  12/04/2023  CHIEF COMPLAINT: f/u of metastatic colon cancer  CURRENT THERAPY:  Oral Xeloda  and bevacizumab  every 3 weeks  Oncology History   Malignant neoplasm of ascending colon (HCC) pT3N2aM0 stage IIB, MSS, metastatic to b/l ovaries and peritoneum and liver, KRAS mutation G12V (+)  -Initially diagnosed in 08/2019, s/p resection with Dr Debby on 11/01/19. Path showed overall Stage IIIB cancer.  -s/p 6 months adjuvant FOLFOX, completed 06/01/20, oxaliplatin  held for final 2 cycles due to neuropathy.  -s/p BSO and hysterectomy on 12/22/21 with Dr. Viktoria. Path revealed metastatic moderately differentiated colonic adenocarcinoma involving both ovaries and peritoneum -she restarted FOLFOX on 01/24/22. -Due to disease progression, chemotherapy changed to second line FOLFIRI and bevacizumab  on April 06, 2022. She has been tolerating moderately well, better now with dose reduction, will continue -Restaging CT scan from 09/27/2022 showed continous response in liver and peritoneal metastasis, no other new lesions.   -We discussed maintenance therapy option in future  -due to her PE in 10/2022, will hold beva for 3 months  -restaging CT 12/31/2022 showed stable disease and live met is not well defined on CT. She restarted beva on 01/17/23  -restaging CT 06/19/2023 showed overall stable disease (stable small lung nodules, no other evidence of cancer) Restaging CT 09/12/2023 showed no residual radiographic evidence of disease.   -  We discussed option of maintenance therapy, treatment changed to capecitabine  and bevacizumab  on 10/02/2023  Assessment & Plan Metastatic colon cancer Metastatic colon cancer under treatment with chemotherapy. She experiences increased fatigue and skin irritation, common side effects of the current oral chemotherapy regimen. Prefers oral chemotherapy over IV for convenience, despite less tolerability. Dose reduction is planned to improve tolerability. - Continue current chemotherapy regimen with dose reduction to 3 pills in the morning and 4 in the evening for the next cycle - Continue bevacizumab  every three weeks - Schedule next scan for August 18 - Review scan results at next appointment on August 25  Chemotherapy-induced skin irritation of hands and feet Skin irritation on hands and feet, characterized by redness and dryness, likely due to chemotherapy. - Reduce chemotherapy dose to alleviate skin irritation - Advise continued use of lotion to manage dryness  Chemotherapy-induced fatigue Increased fatigue likely related to chemotherapy. Reports needing naps in the afternoon, a change from her baseline. - Reduce chemotherapy dose to manage fatigue  Chemotherapy-induced peripheral neuropathy Peripheral neuropathy with increased sensitivity in fingertips, managed with Voltaren ointment. Reports some swelling in the hands. - Advise continued use of Voltaren ointment to reduce inflammation  Anemia secondary to chemotherapy Mild anemia with hemoglobin at 10.6, likely secondary to chemotherapy. Blood counts otherwise stable.  Plan - Due to her fatigue and skin toxicity, I will reduce Xeloda  to 3 tablets in the morning and 4 tablets in the evening, 7 days on and 7 days off, new prescription called in today - Will proceed Vectibix today and continue every 3 weeks   SUMMARY OF ONCOLOGIC HISTORY: Oncology History Overview Note   Cancer Staging  Malignant neoplasm of female breast  [facility]) Staging form: Breast,  AJCC 7th Edition - Clinical stage from 06/11/2014: Stage 0 (Tis (DCIS), N0, M0) - Unsigned Staged by: Pathologist and managing physician Laterality: Left Estrogen receptor status: Positive Progesterone receptor status: Positive Stage used in treatment planning: Yes National guidelines used in treatment planning: Yes Type of national guideline used in treatment planning: NCCN - Pathologic stage from 07/03/2014: Stage Unknown (Tis (DCIS), NX, cM0) - Signed by Guinevere Chew, MD on 07/10/2014 Staged by: Pathologist Laterality: Left Estrogen receptor status: Positive Progesterone receptor status: Positive Stage used in treatment planning: Yes National guidelines used in treatment planning: Yes Type of national guideline used in treatment planning: NCCN Staging comments: Staged on final lumpectomy specimen by Dr. Loral.  Right colon cancer Staging form: Colon and Rectum, AJCC 8th Edition - Pathologic stage from 11/01/2019: Stage IIIB (pT3, pN2a, cM0) - Signed by [name], MD on 11/29/2019 Stage prefix: Initial diagnosis Histologic grading system: 4 grade system Histologic grade (G): G2 Residual tumor (R): R0 - None Tumor deposits (TD): Absent Perineural invasion (PNI): Absent Microsatellite instability (MSI): Stable KRAS mutation: Unknown NRAS mutation: Unknown BRAF mutation: Unknown     Malignant neoplasm of female breast (HCC)  05/29/2014 Initial Biopsy   Left breast needle core biopsy: Grade 2, DCIS with calcs. ER+ (100%), PR+ (96%).    06/04/2014 Initial Diagnosis   Left breast DCIS with calcifications, ER 100%, PR 96%   06/10/2014 Breast MRI   Left breast: 2.4 x 1.3 x 1.1 cm area of patchy non-mass enhancement upper outer quadrant includes postbiopsy seroma; Right breast: 1.2 cm previously biopsied stable benign fibroadenoma   06/12/2014 Procedure   Genetic counseling/testing: Identified 1 VUS on CHEK2 gene. Remainder of 17 gene panel tested negative and  included: ATM, BARD1, BRCA 1/2, BRIP1, CDH1, CHEK2, EPCAM, MLH1, MSH2, MSH6, NBN, NF1, PALB2, PTEN, RAD50, RAD51C, RAD51D, STK11, and TP53.    07/01/2014 Surgery   Left breast lumpectomy ([facility]): Grade 1, DCIS, spanning 2.3 cm, 1 mm margin, ER 100%, PR 96%   07/31/2014 - 08/28/2014 Radiation Therapy   Adjuvant RT completed Signe). Left breast: Total dose 42.5 Gy over 17 fractions. Left breast boost: Total dose 7.5 Gy over 3 fractions.    09/14/2014 - 01/13/2020 Anti-estrogen oral therapy   Anastrazole 1mg  daily. Planned duration of treatment: 5 years [place]). Completed in 01/2020.    09/25/2014 Survivorship   Survivorship Care Plan given to patient and reviewed with her in person.    03/02/2021 Imaging   CT CAP  IMPRESSION: 1. No findings of active/recurrent malignancy. Partial right hemicolectomy. 2. Endometrial stripe remains mildly thickened, but endometrial biopsy in May was negative for malignancy. 3. Progressive endplate sclerosis and endplate irregularity at T2-3, probably due to degenerative endplate findings. If the has referable upper thoracic pain/symptoms then thoracic spine MRI could be used for further workup. 4. Other imaging findings of potential clinical significance: Mild cardiomegaly. Aortic Atherosclerosis (ICD10-I70.0). Mild mitral valve calcification. Postoperative findings in the left breast with adjacent radiation port anteriorly in the left upper lobe. Tiny pulmonary nodules in the left lower lobe are unchanged from earliest available comparison of 10/17/2019 and probably benign although may merit surveillance. Multilevel lumbar impingement. Mild pelvic floor laxity.   Malignant neoplasm of ascending colon (HCC)  09/19/2019 Imaging   CT AP W contrast 09/19/19  IMPRESSION Fullness in the cecum, cannot exclude a mass. No evidence for metastatic disease is identified.    09/24/2019 Procedure   Colonoscopy by Dr Ernesto 09/24/19 IMPRESSION 1. The colon was  redundant  2. Mild diverticulosis was noted through the entire examined colon 3. Single 12mm polyp was found in the ascending colon; polypectomy was performed using snare cautery and biopsy forceps 4. Mild diverticulosis was notes in the descending colon and sigmoid colon.  5. Single polyp was found in the sigmoid colon, polypectomy was performed with cold forceps.  6. Single polyp was found in the rectosigmoid colon; polypectomy was performed with cold snare  7. Small internal hemorrhoids  8. Large mass was found at the cecum; multiple biopsies of the area were performed using cold forceps; injection (tattooing) was performed distal to the mass.    09/24/2019 Initial Biopsy   INTERPRETATION AND DIAGNOSIS:  A. Cecum, biopsy:  Invasive moderately differentiated adenocarcinoma.  see comment  B. Polyp @ ascending colon, polypectomy:  Tubular Adenoma  C. Polyp @ sigmoid colon Polypectomy:  hyperplastic polyp.  D. Polyp @ rectosigmoid colon, Polypectomy:  Hyperplastic Polyp      10/16/2019 Imaging   CT Chest IMPRESSION: 1. Multiple small pulmonary nodules measuring 5 mm or less in size in the lungs. These are nonspecific and are typically considered statistically likely benign. However, given the patient's history of primary malignancy, close attention on follow-up studies is recommended to ensure stability. 2. Aortic atherosclerosis, in addition to right coronary artery disease. Assessment for potential risk factor modification, dietary therapy or pharmacologic therapy may be warranted, if clinically indicated. 3. There are calcifications of the aortic valve and mitral annulus. Echocardiographic correlation for evaluation of potential valvular dysfunction may be warranted if clinically indicated. 4. Small hiatal hernia.   Aortic Atherosclerosis (ICD10-I70.0).   11/01/2019 Initial Diagnosis   Colon cancer (HCC)   11/01/2019 Surgery   LAPAROSCOPIC PARTIAL COLECTOMY by Dr Debby and  Dr Sheldon   11/01/2019 Pathology Results   FINAL MICROSCOPIC DIAGNOSIS:   A. COLON, PROXIMAL RIGHT, COLECTOMY:  - Invasive colonic adenocarcinoma, 5 cm.  - Tumor invades through the muscularis propria into pericolonic tissues.   - Margins of resection are not involved.  - Metastatic carcinoma in (5) of (13) lymph nodes.  - See oncology table.    MSI Stable  Mismatch repair normal  MLH1 - Preserved nuclear expression (greater 50% tumor expression) MSH2 - Preserved nuclear expression (greater 50% tumor expression) MSH6 - Preserved nuclear expression (greater 50% tumor expression) PMS2 - Preserved nuclear expression (greater 50% tumor expression)   11/01/2019 Cancer Staging   Staging form: Colon and Rectum, AJCC 8th Edition - Pathologic stage from 11/01/2019: Stage IIIB (pT3, pN2a, cM0) - Signed by [name], MD on 11/29/2019   12/10/2019 Procedure   PAC placed 12/10/19   12/17/2019 - 06/01/2020 Chemotherapy   FOLFOX q2weeks starting in 2 weeks starting 12/17/19. Held 01/27/20-02/10/20 due to b/l PE. Oxaliplatin  held C11-12 due to neuropathy. Completed on 06/01/20   03/02/2021 Imaging   CT CAP  IMPRESSION: 1. No findings of active/recurrent malignancy. Partial right hemicolectomy. 2. Endometrial stripe remains mildly thickened, but endometrial biopsy in May was negative for malignancy. 3. Progressive endplate sclerosis and endplate irregularity at T2-3, probably due to degenerative endplate findings. If the has referable upper thoracic pain/symptoms then thoracic spine MRI could be used for further workup. 4. Other imaging findings of potential clinical significance: Mild cardiomegaly. Aortic Atherosclerosis (ICD10-I70.0). Mild mitral valve calcification. Postoperative findings in the left breast with adjacent radiation port anteriorly in the left upper lobe. Tiny pulmonary nodules in the left lower lobe are unchanged from earliest available comparison of 10/17/2019 and probably benign  although may merit  surveillance. Multilevel lumbar impingement. Mild pelvic floor laxity.   08/26/2021 Imaging   EXAM: CT CHEST, ABDOMEN, AND PELVIS WITH CONTRAST  IMPRESSION: 1. Stable examination without new or progressive findings to suggest local recurrence or metastatic disease within the chest, abdomen, or pelvis. 2. Hepatomegaly with hepatic steatosis. 3. Sigmoid colonic diverticulosis without findings of acute diverticulitis. 4. Similar prominent endplate sclerosis and irregularity at T2-T3 is most consistent with Modic type endplate degenerative changes. However, if patient has referable upper thoracic pain consider further workup with thoracic spine MRI. 5. Similar thickening of the endometrial stripe measuring up to 8 mm, which was previously biopsied with results negative for malignancy. 6. Aortic Atherosclerosis (ICD10-I70.0).   11/10/2021 PET scan   IMPRESSION: 1. LEFT ovary is increased in size and is intensely hypermetabolic. While physiologic hypermetabolic ovarian tissue is not uncommon, the enlargement and asymmetric activity warrants further evaluation. Consider contrast pelvic MRI vs tissue sampling. 2. No evidence of metastatic colorectal carcinoma otherwise. 3. Post RIGHT hemicolectomy anatomy. 4. Evidence of radiation change in the LEFT upper lobe (remote breast cancer).     12/22/2021 Relapse/Recurrence    FINAL MICROSCOPIC DIAGNOSIS:   A. LEFT OVARY AND FALLOPIAN TUBE, SALPINGO OOPHORECTOMY:  - Metastatic moderately differentiated colonic adenocarcinoma involving left ovary  - Focal ovarian stromal calcification  - Segment of benign fallopian tube   B. UTERUS WITH RIGHT FALLOPIAN TUBE AND OVARY, HYSTERECTOMY AND SALPINGO-OOPHORECTOMY:  - Metastatic moderately differentiated colonic adenocarcinoma involving right ovary  - Focal invasive extensive adenomyosis  - Benign endometrial polyps  - Benign proliferative phase endometrium  - Hydrosalpinx of right  fallopian tube  - Focal ovarian stromal calcification   C. PERITONEAL DEPOSITS, ANTERIOR CUL DE SAC, BIOPSY:  - Metastatic mucinous adenocarcinoma, consistent with colorectal primary    COMMENT:  Immunohistochemical stains show that the tumor cells are positive for CK20 and CDX2 while they are negative for CK7 and PAX8, consistent with above interpretation.    01/24/2022 - 03/23/2022 Chemotherapy   Patient is on Treatment Plan : COLORECTAL FOLFOX q14d x 3 months     04/01/2022 Imaging   CT AP IMPRESSION: 1. New omental metastatic disease. 2. Vague hypoattenuating lesion in the inferior right hepatic lobe is new and also worrisome for metastatic disease. 3. Similar small right lower lobe nodules. Recommend continued attention on follow-up. 4. Steatotic enlarged liver. 5.  Aortic atherosclerosis (ICD10-I70.0).   04/06/2022 - 09/18/2023 Chemotherapy   Patient is on Treatment Plan : COLORECTAL FOLFIRI + Bevacizumab  q14d     04/21/2022 Imaging    IMPRESSION: 1. Stable chest CT. No evidence of metastatic disease. 2. Stable small pulmonary nodules, considered benign based on stability. 3. Stable postsurgical changes in the left breast and anterior left upper lobe radiation changes. 4. Aortic Atherosclerosis (ICD10-I70.0) and Emphysema (ICD10-J43.9).   04/21/2022 Imaging    IMPRESSION: 1. Small enhancing lesion inferiorly in the right hepatic lobe is typical of metastatic disease and stable from recent abdominal CT. No other definite liver lesions are identified. Mild hepatic steatosis. 2. No other significant abdominal findings. 3. Omental nodularity seen on CT is not well visualized by MRI.    Imaging     06/23/2022 Imaging    IMPRESSION: 1. Hypodense lesion of the inferior right lobe of the liver, hepatic segment VI is diminished in size, consistent with treatment response. 2. Tiny peritoneal and omental nodules identified by prior examination are diminished in size,  consistent with treatment response. 3. Multiple tiny pulmonary nodules unchanged, most likely benign  and incidental, however continued attention on follow-up warranted in the setting of known metastatic disease. 4. No evidence of new metastatic disease in the chest, abdomen, or pelvis. 5. Status post partial right hemicolectomy and reanastomosis. 6. Diffuse mosaic attenuation of the airspaces, consistent with small airways disease. 7. Hepatomegaly.   10/02/2023 -  Chemotherapy   Patient is on Treatment Plan : COLORECTAL Bevacizumab  q21d        Discussed the use of AI scribe software for clinical note transcription with the patient, who gave verbal consent to proceed.  History of Present Illness Kim Castro is a 78 year old female with metastatic colon cancer who presents for follow-up.  She experiences increased fatigue over the last couple of weeks, now requiring a nap in the afternoon.  She has skin irritation on her hands and feet, with dry, red, and flaking skin, especially on the fingertips. She applies lotion and uses Voltaren ointment for sensitivity, which provides relief. There is some swelling in her hands, slightly more than usual.  Her chemotherapy regimen involves taking four pills twice a day for one week, followed by a week off. She has a week's worth of medication at home and has coordinated with her pharmacy regarding refills. Her current medications include potassium and pregabalin , which she recently refilled.     All other systems were reviewed with the patient and are negative.  MEDICAL HISTORY:  Past Medical History:  Diagnosis Date   Aortic atherosclerosis (HCC)    Arthritis    feet, lower back   Basal cell carcinoma    arm   BRCA gene mutation negative    Breast cancer of upper-outer quadrant of left female breast (HCC) 06/04/2014   Cataract    immature on the left   Colon cancer (HCC) 08/2019   Diabetes mellitus without complication (HCC)     Diverticulosis    Dizziness    > 47yrs ago;took Antivert    Family history of anesthesia complication    sister slow to wake up with anesthesia   Family history of breast cancer    Family history of colon cancer    Family history of uterine cancer    GERD (gastroesophageal reflux disease)    takes occasional TUMs   History of bronchitis    > 54yrs ago   History of colon polyps    History of hiatal hernia    Small noted on CT   History of pulmonary embolus (PE)    Hypertension    takes Losartan  daily and HCTZ   Iron deficiency anemia    Joint pain    Metastatic disease (HCC) 2023   peritoneum and liver mets   Numbness    to toes on each foot   Peripheral neuropathy    feet and toes   Personal history of radiation therapy    Pulmonary nodules    Noted on CT   Radiation 07/31/14-08/28/14   Left Breast 20 fxs   Seasonal allergies    takes Claritin prn   Urinary frequency    Vitamin D deficiency    takes VIt D daily    SURGICAL HISTORY: Past Surgical History:  Procedure Laterality Date   BREAST BIOPSY Bilateral    BREAST LUMPECTOMY Left    BREAST LUMPECTOMY WITH RADIOACTIVE SEED LOCALIZATION Left 07/01/2014   Procedure: LEFT BREAST LUMPECTOMY WITH RADIOACTIVE SEED LOCALIZATION;  Surgeon: Morene Olives, MD;  Location: [facility];  Service: General;  Laterality: Left;   CATARACT EXTRACTION  Right    COLONOSCOPY  09/24/2019   [place]   LAPAROSCOPIC PARTIAL COLECTOMY N/A 11/01/2019   Procedure: LAPAROSCOPIC PARTIAL COLECTOMY;  Surgeon: [name], MD;  Location: [facility];  Service: General;  Laterality: N/A;   POLYPECTOMY     PORTACATH PLACEMENT N/A 12/10/2019   Procedure: INSERTION PORT-A-CATH ULTRASOUND GUIDED IN RIGHT IJ;  Surgeon: [name], MD;  Location: [facility];  Service: General;  Laterality: N/A;   ROBOTIC ASSISTED TOTAL HYSTERECTOMY Bilateral 12/22/2021   Procedure: XI ROBOTIC ASSISTED TOTAL HYSTERECTOMY WITH BILATERAL SALPINGO  OOPHORECTOMY;  Surgeon: Viktoria Comer SAUNDERS, MD;  Location: [facility];  Service: Gynecology;  Laterality: Bilateral;   TOTAL KNEE ARTHROPLASTY Left 10/24/2012   Procedure: TOTAL KNEE ARTHROPLASTY;  Surgeon: Dempsey JINNY Sensor, MD;  Location: [facility];  Service: Orthopedics;  Laterality: Left;   TOTAL KNEE ARTHROPLASTY Right 01/09/2013   Procedure: TOTAL KNEE ARTHROPLASTY;  Surgeon: Dempsey JINNY Sensor, MD;  Location: [facility];  Service: Orthopedics;  Laterality: Right;   TUBAL LIGATION      I have reviewed the social history and family history with the patient and they are unchanged from previous note.  ALLERGIES:  is allergic to oxycodone .  MEDICATIONS:  Current Outpatient Medications  Medication Sig Dispense Refill   b complex vitamins capsule Take 1 capsule by mouth daily.     capecitabine  (XELODA ) 500 MG tablet Take 3 tabs in morning and 4 tabs in evening. Take within 30 minutes after meals. Take for 7 days on and 7 days off, then repeat. 98 tablet 0   Cholecalciferol (VITAMIN D) 2000 UNITS CAPS Take 2,000 Units by mouth daily.      ELIQUIS  5 MG TABS tablet Take 1 tablet by mouth twice daily 60 tablet 0   famotidine (PEPCID) 20 MG tablet Take 20 mg by mouth daily.     ibuprofen (ADVIL) 200 MG tablet Take 200 mg by mouth daily as needed (pain).     lidocaine -prilocaine  (EMLA ) cream Apply 1 Application topically as needed. 30 g 1   loratadine (CLARITIN) 10 MG tablet Take 10 mg by mouth daily as needed for allergies.     losartan  (COZAAR ) 25 MG tablet Take 1 tablet by mouth once daily 90 tablet 2   metFORMIN  (GLUCOPHAGE ) 500 MG tablet Take 1 tablet (500 mg total) by mouth in the morning and at bedtime. 180 tablet 3   Multiple Vitamins-Minerals (MULTIVITAMIN WITH MINERALS) tablet Take 1 tablet by mouth daily.     omeprazole (PRILOSEC) 20 MG capsule Take 20 mg by mouth daily before breakfast.  (Patient not taking: Reported on 11/10/2023)     ondansetron  (ZOFRAN ) 8 MG tablet Take 1 tablet (8 mg total) by mouth  every 8 (eight) hours as needed for nausea or vomiting. Start on the third day after chemotherapy. 30 tablet 1   potassium chloride  (KLOR-CON ) 10 MEQ tablet Take 1 tablet by mouth twice daily 60 tablet 0   pravastatin  (PRAVACHOL ) 10 MG tablet Take 1 tablet (10 mg total) by mouth daily. 90 tablet 3   pregabalin  (LYRICA ) 25 MG capsule TAKE 1 CAPSULE BY MOUTH ONCE DAILY IN  ADDITION  TO  50MG   AT  BEDTIME 30 capsule 0   pregabalin  (LYRICA ) 50 MG capsule TAKE 1 CAPSULE BY MOUTH TWICE DAILY TAKE  AN  ADDITIONAL  25MG   AT  BEDTIME  FOR  A  TOTAL  OF  75MG   AT  BEDTIME 60 capsule 0   prochlorperazine  (COMPAZINE ) 10 MG tablet Take 1 tablet (  10 mg total) by mouth every 6 (six) hours as needed for nausea or vomiting. 30 tablet 1   traMADol  (ULTRAM ) 50 MG tablet Take 1 tablet (50 mg total) by mouth every 6 (six) hours as needed. (Patient not taking: Reported on 11/10/2023) 30 tablet 0   triamcinolone  ointment (KENALOG ) 0.5 % Apply 1 Application topically 2 (two) times daily. (Patient not taking: Reported on 11/10/2023) 30 g 0   No current facility-administered medications for this visit.    PHYSICAL EXAMINATION: ECOG PERFORMANCE STATUS: 2 - Symptomatic, <50% confined to bed  Vitals:   12/04/23 0818  BP: 124/60  Pulse: 66  Resp: 16  Temp: 98.1 F (36.7 C)  SpO2: 99%   Wt Readings from Last 3 Encounters:  12/04/23 209 lb (94.8 kg)  11/13/23 206 lb 12.8 oz (93.8 kg)  11/10/23 204 lb 6.4 oz (92.7 kg)     GENERAL:alert, no distress and comfortable SKIN: skin color, texture, turgor are normal, no rashes or significant lesions except skin redness and mild peeling on palms EYES: normal, Conjunctiva are pink and non-injected, sclera clear NECK: supple, thyroid normal size, non-tender, without nodularity LYMPH:  no palpable lymphadenopathy in the cervical, axillary  LUNGS: clear to auscultation and percussion with normal breathing effort HEART: regular rate & rhythm and no murmurs and no lower extremity  edema ABDOMEN:abdomen soft, non-tender and normal bowel sounds Musculoskeletal:no cyanosis of digits and no clubbing  NEURO: alert & oriented x 3 with fluent speech, no focal motor/sensory deficits  Physical Exam   LABORATORY DATA:  I have reviewed the data as listed    Latest Ref Rng & Units 12/04/2023    7:38 AM 11/13/2023    8:32 AM 10/23/2023    7:48 AM  CBC  WBC 4.0 - 10.5 K/uL 4.2  5.5  4.9   Hemoglobin 12.0 - 15.0 g/dL 89.3  89.7  89.4   Hematocrit 36.0 - 46.0 % 32.0  30.7  32.9   Platelets 150 - 400 K/uL 143  152  201         Latest Ref Rng & Units 12/04/2023    7:38 AM 11/13/2023    8:32 AM 10/02/2023    8:39 AM  CMP  Glucose 70 - 99 mg/dL 899  896  96   BUN 8 - 23 mg/dL 20  25  21    Creatinine 0.44 - 1.00 mg/dL 9.31  9.27  9.12   Sodium 135 - 145 mmol/L 140  141  142   Potassium 3.5 - 5.1 mmol/L 4.4  4.3  4.3   Chloride 98 - 111 mmol/L 109  111  115   CO2 22 - 32 mmol/L 25  25  21    Calcium  8.9 - 10.3 mg/dL 9.3  9.5  8.9   Total Protein 6.5 - 8.1 g/dL 6.2  6.2  6.4   Total Bilirubin 0.0 - 1.2 mg/dL 2.0  1.3  0.5   Alkaline Phos 38 - 126 U/L 101  108  140   AST 15 - 41 U/L 40  37  20   ALT 0 - 44 U/L 28  26  14        RADIOGRAPHIC STUDIES: I have personally reviewed the radiological images as listed and agreed with the findings in the report. No results found.    No orders of the defined types were placed in this encounter.  All questions were answered. The patient knows to call the clinic with any problems, questions or concerns. No  barriers to learning was detected. The total time spent in the appointment was 30 minutes, including review of chart and various tests results, discussions about plan of care and coordination of care plan     Onita Mattock, MD 12/04/2023

## 2023-12-05 ENCOUNTER — Ambulatory Visit: Payer: Self-pay | Admitting: Nurse Practitioner

## 2023-12-11 ENCOUNTER — Other Ambulatory Visit: Payer: Self-pay | Admitting: Nurse Practitioner

## 2023-12-11 ENCOUNTER — Other Ambulatory Visit: Payer: Self-pay

## 2023-12-12 ENCOUNTER — Encounter: Payer: Self-pay | Admitting: Hematology

## 2023-12-13 ENCOUNTER — Other Ambulatory Visit: Payer: Self-pay

## 2023-12-13 ENCOUNTER — Ambulatory Visit: Admitting: Family Medicine

## 2023-12-13 ENCOUNTER — Other Ambulatory Visit: Payer: Self-pay | Admitting: Pharmacy Technician

## 2023-12-13 ENCOUNTER — Encounter: Payer: Self-pay | Admitting: Family Medicine

## 2023-12-13 VITALS — BP 126/68 | HR 66 | Temp 97.2°F | Ht 65.5 in | Wt 208.2 lb

## 2023-12-13 DIAGNOSIS — E7849 Other hyperlipidemia: Secondary | ICD-10-CM

## 2023-12-13 DIAGNOSIS — C182 Malignant neoplasm of ascending colon: Secondary | ICD-10-CM | POA: Diagnosis not present

## 2023-12-13 DIAGNOSIS — E785 Hyperlipidemia, unspecified: Secondary | ICD-10-CM

## 2023-12-13 DIAGNOSIS — I10 Essential (primary) hypertension: Secondary | ICD-10-CM | POA: Diagnosis not present

## 2023-12-13 DIAGNOSIS — G620 Drug-induced polyneuropathy: Secondary | ICD-10-CM | POA: Diagnosis not present

## 2023-12-13 DIAGNOSIS — Z7984 Long term (current) use of oral hypoglycemic drugs: Secondary | ICD-10-CM

## 2023-12-13 DIAGNOSIS — T451X5A Adverse effect of antineoplastic and immunosuppressive drugs, initial encounter: Secondary | ICD-10-CM

## 2023-12-13 DIAGNOSIS — E1169 Type 2 diabetes mellitus with other specified complication: Secondary | ICD-10-CM | POA: Diagnosis not present

## 2023-12-13 NOTE — Assessment & Plan Note (Signed)
 A1c has been meeting goals. Recent blood glucoses are doing well. I will hold off on additional blood tests today. Continue metformin  500 mg bid.

## 2023-12-13 NOTE — Assessment & Plan Note (Signed)
 Continue chemotherapy and following with oncology.

## 2023-12-13 NOTE — Progress Notes (Signed)
 Specialty Pharmacy Refill Coordination Note  Kim Castro is a 78 y.o. female contacted today regarding refills of specialty medication(s) Capecitabine  (XELODA )   Patient requested [name] at [facility] at [place] date: 12/20/23   Medication will be filled on 12/19/23.

## 2023-12-13 NOTE — Assessment & Plan Note (Signed)
 Secondary to oxaliplatin . Continue pregabalin  and B complex.  Overall stable.

## 2023-12-13 NOTE — Progress Notes (Signed)
 [facility] [address] [place] [address] Dept: [phone] Dept Fax: [phone]  Chronic Care Office Visit  Subjective:    Patient ID: Kim Castro, female    DOB: [birth year], 78 y.o..   MRN: [id number]  Chief Complaint  Patient presents with   Hypertension    3 month f/u HTN.     History of Present Illness:  Patient is in today for reassessment of chronic medical issues.  Kim Castro has a history of type 2 diabetes. She is managed on metformin  500 mg bid.    Kim Castro has a history of hypertension. She is managed on losartan  25 mg daily.   Kim Castro has a history of hyperlipidemia. She is currently on pravastatin  10 mg daily.   Kim Castro has a history of both breast and colon cancer. Earlier in 2023, Kim Castro was found to have metastatic colon cancer throughout the peritoneal cavity, esp. involving both ovaries. She underwent a total hysterectomy. She currently is on oral chemotherapy. This has been complicated with nausea, skin irritation, fatigue, peripheral neuropathy, and anemia.  Her CEA has been increasing gradually. She has a CT scan planned for next week.  Past Medical History: Patient Active Problem List   Diagnosis Date Noted   Acute right-sided low back pain without sciatica 05/30/2023   Elevated alkaline phosphatase level 05/30/2023   Pulmonary embolism (HCC) 10/02/2022   Macrocytic anemia 10/02/2022   Leukocytosis 10/02/2022   Metastasis to peritoneal cavity (HCC) 01/12/2022   Nausea without vomiting 12/23/2021   Thickened endometrium    Essential hypertension 05/12/2021   Vitamin D deficiency 05/12/2021   Personal history of malignant neoplasm of breast 05/12/2021   History of colonic polyps 05/12/2021   Morbid obesity (HCC) 05/12/2021   Hyperlipidemia 05/12/2021   Hypercalcemia 05/12/2021   Allergic rhinitis 05/12/2021   Knee joint replacement status, bilateral 05/12/2021   Peripheral  neuropathy due to chemotherapy (HCC) 05/12/2021   Aortic atherosclerosis (HCC) 05/12/2021   Gastroesophageal reflux disease    Type 2 diabetes mellitus with hyperlipidemia (HCC)    History of pulmonary embolism 01/27/2020   Port-A-Cath in place 12/30/2019   Malignant neoplasm of ascending colon (HCC) 11/01/2019   Genetic testing 07/08/2014   Malignant neoplasm of female breast (HCC) 06/04/2014   Past Surgical History:  Procedure Laterality Date   BREAST BIOPSY Bilateral    BREAST LUMPECTOMY Left    BREAST LUMPECTOMY WITH RADIOACTIVE SEED LOCALIZATION Left 07/01/2014   Procedure: LEFT BREAST LUMPECTOMY WITH RADIOACTIVE SEED LOCALIZATION;  Surgeon: Morene Olives, MD;  Location: [facility];  Service: General;  Laterality: Left;   CATARACT EXTRACTION Right    COLONOSCOPY  09/24/2019   [place]   LAPAROSCOPIC PARTIAL COLECTOMY N/A 11/01/2019   Procedure: LAPAROSCOPIC PARTIAL COLECTOMY;  Surgeon: [name], MD;  Location: [facility];  Service: General;  Laterality: N/A;   POLYPECTOMY     PORTACATH PLACEMENT N/A 12/10/2019   Procedure: INSERTION PORT-A-CATH ULTRASOUND GUIDED IN RIGHT IJ;  Surgeon: [name], MD;  Location: [facility];  Service: General;  Laterality: N/A;   ROBOTIC ASSISTED TOTAL HYSTERECTOMY Bilateral 12/22/2021   Procedure: XI ROBOTIC ASSISTED TOTAL HYSTERECTOMY WITH BILATERAL SALPINGO OOPHORECTOMY;  Surgeon: Viktoria Comer SAUNDERS, MD;  Location: [facility];  Service: Gynecology;  Laterality: Bilateral;   TOTAL KNEE ARTHROPLASTY Left 10/24/2012   Procedure: TOTAL KNEE ARTHROPLASTY;  Surgeon: Dempsey JINNY Sensor, MD;  Location: [facility];  Service: Orthopedics;  Laterality: Left;   TOTAL KNEE  ARTHROPLASTY Right 01/09/2013   Procedure: TOTAL KNEE ARTHROPLASTY;  Surgeon: Dempsey JINNY Sensor, MD;  Location: [facility];  Service: Orthopedics;  Laterality: Right;   TUBAL LIGATION     Family History  Problem Relation Age of Onset   Diabetes Mother    Uterine cancer Mother         deceased 69   Hypertension Father    Heart disease Father    Diabetes Sister    Breast cancer Sister 61       BRCA neg   Hypertension Sister    Diabetes Sister    Breast cancer Sister 66       BRCA neg   Diabetes Sister    Kidney disease Sister    Heart disease Sister    Lupus Sister    Breast cancer Sister 57       BRCA neg   Heart disease Sister    Diabetes Sister    Breast cancer Sister 59       BRCA neg   Diabetes Sister    Thyroid cancer Daughter 10   Breast cancer Daughter 27       BRCA neg   Colon polyps Daughter    Cancer Maternal Uncle        unk. primary; deceased 22s   Stroke Maternal Grandmother    Hypertension Brother    Diabetes Brother    Colon cancer Brother 44   Breast cancer Niece 31   Esophageal cancer Neg Hx    Rectal cancer Neg Hx    Stomach cancer Neg Hx    Outpatient Medications Prior to Visit  Medication Sig Dispense Refill   b complex vitamins capsule Take 1 capsule by mouth daily.     capecitabine  (XELODA ) 500 MG tablet Take 3 tabs in morning and 4 tabs in evening. Take within 30 minutes after meals. Take for 7 days on and 7 days off, then repeat. 98 tablet 0   Cholecalciferol (VITAMIN D) 2000 UNITS CAPS Take 2,000 Units by mouth daily.      ELIQUIS  5 MG TABS tablet Take 1 tablet by mouth twice daily 60 tablet 0   famotidine (PEPCID) 20 MG tablet Take 20 mg by mouth daily.     ibuprofen (ADVIL) 200 MG tablet Take 200 mg by mouth daily as needed (pain).     lidocaine -prilocaine  (EMLA ) cream Apply 1 Application topically as needed. 30 g 1   loratadine (CLARITIN) 10 MG tablet Take 10 mg by mouth daily as needed for allergies.     losartan  (COZAAR ) 25 MG tablet Take 1 tablet by mouth once daily 90 tablet 2   metFORMIN  (GLUCOPHAGE ) 500 MG tablet Take 1 tablet (500 mg total) by mouth in the morning and at bedtime. 180 tablet 3   Multiple Vitamins-Minerals (MULTIVITAMIN WITH MINERALS) tablet Take 1 tablet by mouth daily.     ondansetron  (ZOFRAN ) 8  MG tablet Take 1 tablet (8 mg total) by mouth every 8 (eight) hours as needed for nausea or vomiting. Start on the third day after chemotherapy. 30 tablet 1   potassium chloride  (KLOR-CON ) 10 MEQ tablet Take 1 tablet by mouth twice daily 60 tablet 0   pravastatin  (PRAVACHOL ) 10 MG tablet Take 1 tablet (10 mg total) by mouth daily. 90 tablet 3   pregabalin  (LYRICA ) 25 MG capsule TAKE 1 CAPSULE BY MOUTH ONCE DAILY IN  ADDITION  TO  50MG   AT  BEDTIME 30 capsule 0   pregabalin  (LYRICA ) 50 MG capsule  TAKE 1 CAPSULE BY MOUTH TWICE DAILY IN ADDITION TO THE 25 MG CAPSULE AS BEDTIME FOR A TOTAL OF 75 MG AT BEDTIME. 60 capsule 0   prochlorperazine  (COMPAZINE ) 10 MG tablet Take 1 tablet (10 mg total) by mouth every 6 (six) hours as needed for nausea or vomiting. 30 tablet 1   traMADol  (ULTRAM ) 50 MG tablet Take 1 tablet (50 mg total) by mouth every 6 (six) hours as needed. 30 tablet 0   triamcinolone  ointment (KENALOG ) 0.5 % Apply 1 Application topically 2 (two) times daily. 30 g 0   omeprazole (PRILOSEC) 20 MG capsule Take 20 mg by mouth daily before breakfast.  (Patient not taking: Reported on 12/13/2023)     No facility-administered medications prior to visit.   Allergies  Allergen Reactions   Oxycodone  Nausea And Vomiting   Objective:   Today's Vitals   12/13/23 0800  BP: 126/68  Pulse: 66  Temp: (!) 97.2 F (36.2 C)  TempSrc: Temporal  SpO2: 96%  Weight: 208 lb 3.2 oz (94.4 kg)  Height: 5' 5.5 (1.664 m)   Body mass index is 34.12 kg/m.   General: Well developed, well nourished. No acute distress. Feet- Skin intact. No sign of maceration between toes. There eis some peeling and thickening of the skin on the ends of the   left 1st-3rd toes.  Nails are normal. Dorsalis pedis and posterior tibial artery pulses are normal. 5.07 monofilament testing   shows much of both feet to be insensate, esp. distally. Psych: Alert and oriented. Normal mood and affect.  Health Maintenance Due  Topic  Date Due   Colonoscopy  02/11/2024   Lab Results    Latest Ref Rng & Units 12/04/2023    7:38 AM 11/13/2023    8:32 AM 10/23/2023    7:48 AM  CBC  WBC 4.0 - 10.5 K/uL 4.2  5.5  4.9   Hemoglobin 12.0 - 15.0 g/dL 89.3  89.7  89.4   Hematocrit 36.0 - 46.0 % 32.0  30.7  32.9   Platelets 150 - 400 K/uL 143  152  201       Latest Ref Rng & Units 12/04/2023    7:38 AM 11/13/2023    8:32 AM 10/02/2023    8:39 AM  CMP  Glucose 70 - 99 mg/dL 899  896  96   BUN 8 - 23 mg/dL 20  25  21    Creatinine 0.44 - 1.00 mg/dL 9.31  9.27  9.12   Sodium 135 - 145 mmol/L 140  141  142   Potassium 3.5 - 5.1 mmol/L 4.4  4.3  4.3   Chloride 98 - 111 mmol/L 109  111  115   CO2 22 - 32 mmol/L 25  25  21    Calcium  8.9 - 10.3 mg/dL 9.3  9.5  8.9   Total Protein 6.5 - 8.1 g/dL 6.2  6.2  6.4   Total Bilirubin 0.0 - 1.2 mg/dL 2.0  1.3  0.5   Alkaline Phos 38 - 126 U/L 101  108  140   AST 15 - 41 U/L 40  37  20   ALT 0 - 44 U/L 28  26  14     Last hemoglobin A1c Lab Results  Component Value Date   HGBA1C 6.1 09/12/2023      Assessment & Plan:   Problem List Items Addressed This Visit       Cardiovascular and Mediastinum   Essential hypertension - Primary   Blood  pressure is in good control. Continue losartan  25 mg daily.        Digestive   Malignant neoplasm of ascending colon (HCC)   Continue chemotherapy and following with oncology.        Endocrine   Type 2 diabetes mellitus with hyperlipidemia (HCC)   A1c has been meeting goals. Recent blood glucoses are doing well. I will hold off on additional blood tests today. Continue metformin  500 mg bid.        Nervous and Auditory   Peripheral neuropathy due to chemotherapy (HCC)   Secondary to oxaliplatin . Continue pregabalin  and B complex.  Overall stable.          Other   Hyperlipidemia   At goal. Continue pravastatin  10 mg daily.       Return in about 3 months (around 03/14/2024) for Reassessment.   Garnette CHRISTELLA Simpler, MD

## 2023-12-13 NOTE — Assessment & Plan Note (Signed)
 At goal. Continue pravastatin  10 mg daily.

## 2023-12-13 NOTE — Assessment & Plan Note (Signed)
 Blood pressure is in good control. Continue losartan  25 mg daily.

## 2023-12-18 ENCOUNTER — Ambulatory Visit (HOSPITAL_COMMUNITY)
Admission: RE | Admit: 2023-12-18 | Discharge: 2023-12-18 | Disposition: A | Discharge status: Home / Self Care | Source: Ambulatory Visit | Attending: Hematology | Admitting: Hematology

## 2023-12-18 DIAGNOSIS — R160 Hepatomegaly, not elsewhere classified: Secondary | ICD-10-CM | POA: Diagnosis not present

## 2023-12-18 DIAGNOSIS — C182 Malignant neoplasm of ascending colon: Secondary | ICD-10-CM | POA: Insufficient documentation

## 2023-12-18 DIAGNOSIS — K760 Fatty (change of) liver, not elsewhere classified: Secondary | ICD-10-CM | POA: Diagnosis not present

## 2023-12-18 DIAGNOSIS — C189 Malignant neoplasm of colon, unspecified: Secondary | ICD-10-CM | POA: Diagnosis not present

## 2023-12-18 DIAGNOSIS — I700 Atherosclerosis of aorta: Secondary | ICD-10-CM | POA: Diagnosis not present

## 2023-12-18 MED ORDER — HEPARIN SOD (PORK) LOCK FLUSH 100 UNIT/ML IV SOLN
INTRAVENOUS | Status: AC
  Filled 2023-12-18: qty 5

## 2023-12-18 MED ORDER — HEPARIN SOD (PORK) LOCK FLUSH 100 UNIT/ML IV SOLN
500.0000 [IU] | Freq: Once | INTRAVENOUS | Status: AC
  Administered 2023-12-18: 500 [IU] via INTRAVENOUS

## 2023-12-18 MED ORDER — IOHEXOL 9 MG/ML PO SOLN: 1000.0000 mL | ORAL | Status: AC

## 2023-12-18 MED ORDER — IOHEXOL 300 MG/ML  SOLN
100.0000 mL | Freq: Once | INTRAMUSCULAR | Status: AC | PRN
  Administered 2023-12-18: 100 mL via INTRAVENOUS

## 2023-12-18 MED ORDER — IOHEXOL 9 MG/ML PO SOLN
ORAL | Status: AC
  Filled 2023-12-18: qty 1000

## 2023-12-19 ENCOUNTER — Other Ambulatory Visit: Payer: Self-pay

## 2023-12-25 ENCOUNTER — Encounter: Payer: Self-pay | Admitting: Hematology

## 2023-12-25 ENCOUNTER — Inpatient Hospital Stay (HOSPITAL_BASED_OUTPATIENT_CLINIC_OR_DEPARTMENT_OTHER): Admitting: Hematology

## 2023-12-25 ENCOUNTER — Inpatient Hospital Stay

## 2023-12-25 ENCOUNTER — Other Ambulatory Visit: Payer: Self-pay | Admitting: Family Medicine

## 2023-12-25 VITALS — BP 138/66 | HR 62 | Temp 98.3°F | Resp 17 | Wt 210.3 lb

## 2023-12-25 DIAGNOSIS — C786 Secondary malignant neoplasm of retroperitoneum and peritoneum: Secondary | ICD-10-CM | POA: Diagnosis not present

## 2023-12-25 DIAGNOSIS — T451X5A Adverse effect of antineoplastic and immunosuppressive drugs, initial encounter: Secondary | ICD-10-CM | POA: Diagnosis not present

## 2023-12-25 DIAGNOSIS — C182 Malignant neoplasm of ascending colon: Secondary | ICD-10-CM

## 2023-12-25 DIAGNOSIS — D6481 Anemia due to antineoplastic chemotherapy: Secondary | ICD-10-CM | POA: Diagnosis not present

## 2023-12-25 DIAGNOSIS — G620 Drug-induced polyneuropathy: Secondary | ICD-10-CM | POA: Diagnosis not present

## 2023-12-25 DIAGNOSIS — R5383 Other fatigue: Secondary | ICD-10-CM | POA: Diagnosis not present

## 2023-12-25 DIAGNOSIS — Z86000 Personal history of in-situ neoplasm of breast: Secondary | ICD-10-CM | POA: Diagnosis not present

## 2023-12-25 DIAGNOSIS — I10 Essential (primary) hypertension: Secondary | ICD-10-CM | POA: Diagnosis not present

## 2023-12-25 DIAGNOSIS — E7849 Other hyperlipidemia: Secondary | ICD-10-CM

## 2023-12-25 DIAGNOSIS — Z7901 Long term (current) use of anticoagulants: Secondary | ICD-10-CM | POA: Diagnosis not present

## 2023-12-25 DIAGNOSIS — C7963 Secondary malignant neoplasm of bilateral ovaries: Secondary | ICD-10-CM | POA: Diagnosis not present

## 2023-12-25 DIAGNOSIS — Z86718 Personal history of other venous thrombosis and embolism: Secondary | ICD-10-CM | POA: Diagnosis not present

## 2023-12-25 DIAGNOSIS — R21 Rash and other nonspecific skin eruption: Secondary | ICD-10-CM | POA: Diagnosis not present

## 2023-12-25 DIAGNOSIS — Z5111 Encounter for antineoplastic chemotherapy: Secondary | ICD-10-CM | POA: Diagnosis not present

## 2023-12-25 DIAGNOSIS — E876 Hypokalemia: Secondary | ICD-10-CM | POA: Diagnosis not present

## 2023-12-25 DIAGNOSIS — Z95828 Presence of other vascular implants and grafts: Secondary | ICD-10-CM

## 2023-12-25 DIAGNOSIS — Z853 Personal history of malignant neoplasm of breast: Secondary | ICD-10-CM | POA: Diagnosis not present

## 2023-12-25 DIAGNOSIS — C787 Secondary malignant neoplasm of liver and intrahepatic bile duct: Secondary | ICD-10-CM | POA: Diagnosis not present

## 2023-12-25 LAB — CBC WITH DIFFERENTIAL (CANCER CENTER ONLY)
Abs Immature Granulocytes: 0.04 K/uL (ref 0.00–0.07)
Basophils Absolute: 0 K/uL (ref 0.0–0.1)
Basophils Relative: 0 %
Eosinophils Absolute: 0.3 K/uL (ref 0.0–0.5)
Eosinophils Relative: 4 %
HCT: 32.5 % — ABNORMAL LOW (ref 36.0–46.0)
Hemoglobin: 11 g/dL — ABNORMAL LOW (ref 12.0–15.0)
Immature Granulocytes: 1 %
Lymphocytes Relative: 39 %
Lymphs Abs: 2.9 K/uL (ref 0.7–4.0)
MCH: 37 pg — ABNORMAL HIGH (ref 26.0–34.0)
MCHC: 33.8 g/dL (ref 30.0–36.0)
MCV: 109.4 fL — ABNORMAL HIGH (ref 80.0–100.0)
Monocytes Absolute: 0.9 K/uL (ref 0.1–1.0)
Monocytes Relative: 12 %
Neutro Abs: 3.3 K/uL (ref 1.7–7.7)
Neutrophils Relative %: 44 %
Platelet Count: 189 K/uL (ref 150–400)
RBC: 2.97 MIL/uL — ABNORMAL LOW (ref 3.87–5.11)
RDW: 19.4 % — ABNORMAL HIGH (ref 11.5–15.5)
WBC Count: 7.4 K/uL (ref 4.0–10.5)
nRBC: 0 % (ref 0.0–0.2)

## 2023-12-25 LAB — COMPREHENSIVE METABOLIC PANEL WITH GFR
ALT: 21 U/L (ref 0–44)
AST: 40 U/L (ref 15–41)
Albumin: 4 g/dL (ref 3.5–5.0)
Alkaline Phosphatase: 101 U/L (ref 38–126)
Anion gap: 6 (ref 5–15)
BUN: 18 mg/dL (ref 8–23)
CO2: 27 mmol/L (ref 22–32)
Calcium: 9 mg/dL (ref 8.9–10.3)
Chloride: 109 mmol/L (ref 98–111)
Creatinine, Ser: 0.63 mg/dL (ref 0.44–1.00)
GFR, Estimated: >60 mL/min (ref >60)
Glucose, Bld: 107 mg/dL — ABNORMAL HIGH (ref 70–99)
Potassium: 4.5 mmol/L (ref 3.5–5.1)
Sodium: 142 mmol/L (ref 135–145)
Total Bilirubin: 1.9 mg/dL — ABNORMAL HIGH (ref 0.0–1.2)
Total Protein: 6.1 g/dL — ABNORMAL LOW (ref 6.5–8.1)

## 2023-12-25 LAB — CEA (ACCESS): CEA (CHCC): 13.13 ng/mL — ABNORMAL HIGH (ref 0.00–5.00)

## 2023-12-25 LAB — TOTAL PROTEIN, URINE DIPSTICK: Protein, ur: NEGATIVE mg/dL

## 2023-12-25 MED ORDER — SODIUM CHLORIDE 0.9 % IV SOLN
7.5000 mg/kg | Freq: Once | INTRAVENOUS | Status: AC
  Administered 2023-12-25: 700 mg via INTRAVENOUS
  Filled 2023-12-25: qty 12

## 2023-12-25 MED ORDER — SODIUM CHLORIDE 0.9 % IV SOLN: INTRAVENOUS | Status: DC

## 2023-12-25 MED ORDER — SODIUM CHLORIDE 0.9% FLUSH
10.0000 mL | INTRAVENOUS | Status: DC | PRN
  Administered 2023-12-25: 10 mL

## 2023-12-25 NOTE — Progress Notes (Signed)
 [facility]   Telephone:([phone] Fax:([phone]) [phone]   Clinic Follow up Note   Patient Care Team: [name], MD as PCP - General (Family Medicine) [name], MD as Consulting Physician (General Surgery) [name], MD as Consulting Physician (Hematology) [name], MD as Consulting Physician (General Surgery) [name], MD as Consulting Physician (Hematology and Oncology) [facility]ll[facility], [facility]   Date of Service:  12/25/2023  CHIEF COMPLAINT: f/u of sciatica colon cancer  CURRENT THERAPY:  Maintenance Xeloda  and bevacizumab   Oncology History   Malignant neoplasm of ascending colon [facility]) pT3N2aM0 stage IIB, MSS, metastatic to b/l ovaries and peritoneum and liver, KRAS mutation G12V (+)  -Initially diagnosed in 08/2019, s/p resection with Dr Debby on 11/01/19. Path showed overall Stage IIIB cancer.  -s/p 6 months adjuvant FOLFOX, completed 06/01/20, oxaliplatin  held for final 2 cycles due to neuropathy.  -s/p BSO and hysterectomy on 12/22/21 with Dr. Viktoria. Path revealed metastatic moderately differentiated colonic adenocarcinoma involving both ovaries and peritoneum -she restarted FOLFOX on 01/24/22. -Due to disease progression, chemotherapy changed to second line FOLFIRI and bevacizumab  on April 06, 2022. She has been tolerating moderately well, better now with dose reduction, will continue -Restaging CT scan from 09/27/2022 showed continous response in liver and peritoneal metastasis, no other new lesions.   -We discussed maintenance therapy option in future  -due to her PE in 10/2022, will hold beva for 3 months  -restaging CT 12/31/2022 showed stable disease and live met is not well defined on CT. She restarted beva on 01/17/23  -restaging CT 06/19/2023 showed overall stable disease (stable small lung nodules, no other evidence of cancer) Restaging CT 09/12/2023 showed no residual radiographic evidence of disease.   - We  discussed option of maintenance therapy, treatment changed to capecitabine  and bevacizumab  on 10/02/2023 -CT 12/18/23 showed stable disease  Assessment & Plan Metastatic colon cancer Metastatic colon cancer is stable with no new findings on the CT scan. She is currently on oral chemotherapy and tolerating it well, despite some worsening of neuropathy. No new lesions or progression noted. Blood pressure is well-controlled, and urine is protein negative, allowing continuation of Avastin . She is aware of the potential for bleeding and blood clots associated with the infusion. - Continue current oral chemotherapy regimen - Schedule next infusion for September 15th  Chemotherapy-induced peripheral neuropathy Worsening neuropathy with the current oral chemotherapy regimen. Symptoms include redness and dryness of the hands, particularly affecting the fingertips, and worsen with physical activity such as mowing the lawn. - Advise use of gloves during activities that exacerbate symptoms - Encourage regular use of lotion, especially after washing hands  Hand dermatitis due to chemotherapy Hand dermatitis characterized by redness and dryness, particularly on the fingertips. Managed with regular use of lotion and Voltaren gel for soreness. - Continue regular use of lotion and Voltaren gel as needed - Advise to apply lotion after washing hands  History of venous thromboembolism on anticoagulation Venous thromboembolism managed with Eliquis . She is asymptomatic and tolerates anticoagulation well. - Continue Eliquis  as prescribed - Monitor for any signs of bleeding or thromboembolic events  Hypertension Blood pressure is well-controlled with current management. - Continue current antihypertensive regimen  Hypokalemia on potassium supplementation Hypokalemia managed with potassium supplementation. She recently refilled potassium medication and reports no issues with current supplementation. - Continue  potassium supplementation as prescribed  Plan - She is clinically doing well, tolerating Xeloda  well overall, with mild skin toxicity, will continue current dose. -  Lab reviewed, adequate for treatment, will proceed with bevacizumab  today and continue every 3 weeks - CT scan reviewed, stable disease, no progression. - Follow-up in 3 weeks before next cycle bevacizumab    SUMMARY OF ONCOLOGIC HISTORY: Oncology History Overview Note   Cancer Staging  Malignant neoplasm of female breast [facility]) Staging form: Breast, AJCC 7th Edition - Clinical stage from 06/11/2014: Stage 0 (Tis (DCIS), N0, M0) - Unsigned Staged by: Pathologist and managing physician Laterality: Left Estrogen receptor status: Positive Progesterone receptor status: Positive Stage used in treatment planning: Yes National guidelines used in treatment planning: Yes Type of national guideline used in treatment planning: NCCN - Pathologic stage from 07/03/2014: Stage Unknown (Tis (DCIS), NX, cM0) - Signed by Guinevere Chew, MD on 07/10/2014 Staged by: Pathologist Laterality: Left Estrogen receptor status: Positive Progesterone receptor status: Positive Stage used in treatment planning: Yes National guidelines used in treatment planning: Yes Type of national guideline used in treatment planning: NCCN Staging comments: Staged on final lumpectomy specimen by Dr. Loral.  Right colon cancer Staging form: Colon and Rectum, AJCC 8th Edition - Pathologic stage from 11/01/2019: Stage IIIB (pT3, pN2a, cM0) - Signed by [name], MD on 11/29/2019 Stage prefix: Initial diagnosis Histologic grading system: 4 grade system Histologic grade (G): G2 Residual tumor (R): R0 - None Tumor deposits (TD): Absent Perineural invasion (PNI): Absent Microsatellite instability (MSI): Stable KRAS mutation: Unknown NRAS mutation: Unknown BRAF mutation: Unknown     Malignant neoplasm of female breast (HCC)  05/29/2014 Initial Biopsy   Left breast needle  core biopsy: Grade 2, DCIS with calcs. ER+ (100%), PR+ (96%).    06/04/2014 Initial Diagnosis   Left breast DCIS with calcifications, ER 100%, PR 96%   06/10/2014 Breast MRI   Left breast: 2.4 x 1.3 x 1.1 cm area of patchy non-mass enhancement upper outer quadrant includes postbiopsy seroma; Right breast: 1.2 cm previously biopsied stable benign fibroadenoma   06/12/2014 Procedure   Genetic counseling/testing: Identified 1 VUS on CHEK2 gene. Remainder of 17 gene panel tested negative and included: ATM, BARD1, BRCA 1/2, BRIP1, CDH1, CHEK2, EPCAM, MLH1, MSH2, MSH6, NBN, NF1, PALB2, PTEN, RAD50, RAD51C, RAD51D, STK11, and TP53.    07/01/2014 Surgery   Left breast lumpectomy ([facility]): Grade 1, DCIS, spanning 2.3 cm, 1 mm margin, ER 100%, PR 96%   07/31/2014 - 08/28/2014 Radiation Therapy   Adjuvant RT completed Signe). Left breast: Total dose 42.5 Gy over 17 fractions. Left breast boost: Total dose 7.5 Gy over 3 fractions.    09/14/2014 - 01/13/2020 Anti-estrogen oral therapy   Anastrazole 1mg  daily. Planned duration of treatment: 5 years [place]). Completed in 01/2020.    09/25/2014 Survivorship   Survivorship Care Plan given to patient and reviewed with her in person.    03/02/2021 Imaging   CT CAP  IMPRESSION: 1. No findings of active/recurrent malignancy. Partial right hemicolectomy. 2. Endometrial stripe remains mildly thickened, but endometrial biopsy in May was negative for malignancy. 3. Progressive endplate sclerosis and endplate irregularity at T2-3, probably due to degenerative endplate findings. If the has referable upper thoracic pain/symptoms then thoracic spine MRI could be used for further workup. 4. Other imaging findings of potential clinical significance: Mild cardiomegaly. Aortic Atherosclerosis (ICD10-I70.0). Mild mitral valve calcification. Postoperative findings in the left breast with adjacent radiation port anteriorly in the left upper lobe. Tiny pulmonary nodules  in the left lower lobe are unchanged from earliest available comparison of 10/17/2019 and probably benign although may merit surveillance. Multilevel lumbar impingement. Mild pelvic  floor laxity.   Malignant neoplasm of ascending colon (HCC)  09/19/2019 Imaging   CT AP W contrast 09/19/19  IMPRESSION Fullness in the cecum, cannot exclude a mass. No evidence for metastatic disease is identified.    09/24/2019 Procedure   Colonoscopy by Dr Ernesto 09/24/19 IMPRESSION 1. The colon was redundant  2. Mild diverticulosis was noted through the entire examined colon 3. Single 12mm polyp was found in the ascending colon; polypectomy was performed using snare cautery and biopsy forceps 4. Mild diverticulosis was notes in the descending colon and sigmoid colon.  5. Single polyp was found in the sigmoid colon, polypectomy was performed with cold forceps.  6. Single polyp was found in the rectosigmoid colon; polypectomy was performed with cold snare  7. Small internal hemorrhoids  8. Large mass was found at the cecum; multiple biopsies of the area were performed using cold forceps; injection (tattooing) was performed distal to the mass.    09/24/2019 Initial Biopsy   INTERPRETATION AND DIAGNOSIS:  A. Cecum, biopsy:  Invasive moderately differentiated adenocarcinoma.  see comment  B. Polyp @ ascending colon, polypectomy:  Tubular Adenoma  C. Polyp @ sigmoid colon Polypectomy:  hyperplastic polyp.  D. Polyp @ rectosigmoid colon, Polypectomy:  Hyperplastic Polyp      10/16/2019 Imaging   CT Chest IMPRESSION: 1. Multiple small pulmonary nodules measuring 5 mm or less in size in the lungs. These are nonspecific and are typically considered statistically likely benign. However, given the patient's history of primary malignancy, close attention on follow-up studies is recommended to ensure stability. 2. Aortic atherosclerosis, in addition to right coronary artery disease. Assessment for potential risk  factor modification, dietary therapy or pharmacologic therapy may be warranted, if clinically indicated. 3. There are calcifications of the aortic valve and mitral annulus. Echocardiographic correlation for evaluation of potential valvular dysfunction may be warranted if clinically indicated. 4. Small hiatal hernia.   Aortic Atherosclerosis (ICD10-I70.0).   11/01/2019 Initial Diagnosis   Colon cancer (HCC)   11/01/2019 Surgery   LAPAROSCOPIC PARTIAL COLECTOMY by Dr Debby and Dr Sheldon   11/01/2019 Pathology Results   FINAL MICROSCOPIC DIAGNOSIS:   A. COLON, PROXIMAL RIGHT, COLECTOMY:  - Invasive colonic adenocarcinoma, 5 cm.  - Tumor invades through the muscularis propria into pericolonic tissues.   - Margins of resection are not involved.  - Metastatic carcinoma in (5) of (13) lymph nodes.  - See oncology table.    MSI Stable  Mismatch repair normal  MLH1 - Preserved nuclear expression (greater 50% tumor expression) MSH2 - Preserved nuclear expression (greater 50% tumor expression) MSH6 - Preserved nuclear expression (greater 50% tumor expression) PMS2 - Preserved nuclear expression (greater 50% tumor expression)   11/01/2019 Cancer Staging   Staging form: Colon and Rectum, AJCC 8th Edition - Pathologic stage from 11/01/2019: Stage IIIB (pT3, pN2a, cM0) - Signed by [name], MD on 11/29/2019   12/10/2019 Procedure   PAC placed 12/10/19   12/17/2019 - 06/01/2020 Chemotherapy   FOLFOX q2weeks starting in 2 weeks starting 12/17/19. Held 01/27/20-02/10/20 due to b/l PE. Oxaliplatin  held C11-12 due to neuropathy. Completed on 06/01/20   03/02/2021 Imaging   CT CAP  IMPRESSION: 1. No findings of active/recurrent malignancy. Partial right hemicolectomy. 2. Endometrial stripe remains mildly thickened, but endometrial biopsy in May was negative for malignancy. 3. Progressive endplate sclerosis and endplate irregularity at T2-3, probably due to degenerative endplate findings. If the has  referable upper thoracic pain/symptoms then thoracic spine MRI could be used for further  workup. 4. Other imaging findings of potential clinical significance: Mild cardiomegaly. Aortic Atherosclerosis (ICD10-I70.0). Mild mitral valve calcification. Postoperative findings in the left breast with adjacent radiation port anteriorly in the left upper lobe. Tiny pulmonary nodules in the left lower lobe are unchanged from earliest available comparison of 10/17/2019 and probably benign although may merit surveillance. Multilevel lumbar impingement. Mild pelvic floor laxity.   08/26/2021 Imaging   EXAM: CT CHEST, ABDOMEN, AND PELVIS WITH CONTRAST  IMPRESSION: 1. Stable examination without new or progressive findings to suggest local recurrence or metastatic disease within the chest, abdomen, or pelvis. 2. Hepatomegaly with hepatic steatosis. 3. Sigmoid colonic diverticulosis without findings of acute diverticulitis. 4. Similar prominent endplate sclerosis and irregularity at T2-T3 is most consistent with Modic type endplate degenerative changes. However, if patient has referable upper thoracic pain consider further workup with thoracic spine MRI. 5. Similar thickening of the endometrial stripe measuring up to 8 mm, which was previously biopsied with results negative for malignancy. 6. Aortic Atherosclerosis (ICD10-I70.0).   11/10/2021 PET scan   IMPRESSION: 1. LEFT ovary is increased in size and is intensely hypermetabolic. While physiologic hypermetabolic ovarian tissue is not uncommon, the enlargement and asymmetric activity warrants further evaluation. Consider contrast pelvic MRI vs tissue sampling. 2. No evidence of metastatic colorectal carcinoma otherwise. 3. Post RIGHT hemicolectomy anatomy. 4. Evidence of radiation change in the LEFT upper lobe (remote breast cancer).     12/22/2021 Relapse/Recurrence    FINAL MICROSCOPIC DIAGNOSIS:   A. LEFT OVARY AND FALLOPIAN TUBE, SALPINGO  OOPHORECTOMY:  - Metastatic moderately differentiated colonic adenocarcinoma involving left ovary  - Focal ovarian stromal calcification  - Segment of benign fallopian tube   B. UTERUS WITH RIGHT FALLOPIAN TUBE AND OVARY, HYSTERECTOMY AND SALPINGO-OOPHORECTOMY:  - Metastatic moderately differentiated colonic adenocarcinoma involving right ovary  - Focal invasive extensive adenomyosis  - Benign endometrial polyps  - Benign proliferative phase endometrium  - Hydrosalpinx of right fallopian tube  - Focal ovarian stromal calcification   C. PERITONEAL DEPOSITS, ANTERIOR CUL DE SAC, BIOPSY:  - Metastatic mucinous adenocarcinoma, consistent with colorectal primary    COMMENT:  Immunohistochemical stains show that the tumor cells are positive for CK20 and CDX2 while they are negative for CK7 and PAX8, consistent with above interpretation.    01/24/2022 - 03/23/2022 Chemotherapy   Patient is on Treatment Plan : COLORECTAL FOLFOX q14d x 3 months     04/01/2022 Imaging   CT AP IMPRESSION: 1. New omental metastatic disease. 2. Vague hypoattenuating lesion in the inferior right hepatic lobe is new and also worrisome for metastatic disease. 3. Similar small right lower lobe nodules. Recommend continued attention on follow-up. 4. Steatotic enlarged liver. 5.  Aortic atherosclerosis (ICD10-I70.0).   04/06/2022 - 09/18/2023 Chemotherapy   Patient is on Treatment Plan : COLORECTAL FOLFIRI + Bevacizumab  q14d     04/21/2022 Imaging    IMPRESSION: 1. Stable chest CT. No evidence of metastatic disease. 2. Stable small pulmonary nodules, considered benign based on stability. 3. Stable postsurgical changes in the left breast and anterior left upper lobe radiation changes. 4. Aortic Atherosclerosis (ICD10-I70.0) and Emphysema (ICD10-J43.9).   04/21/2022 Imaging    IMPRESSION: 1. Small enhancing lesion inferiorly in the right hepatic lobe is typical of metastatic disease and stable from recent  abdominal CT. No other definite liver lesions are identified. Mild hepatic steatosis. 2. No other significant abdominal findings. 3. Omental nodularity seen on CT is not well visualized by MRI.    Imaging  06/23/2022 Imaging    IMPRESSION: 1. Hypodense lesion of the inferior right lobe of the liver, hepatic segment VI is diminished in size, consistent with treatment response. 2. Tiny peritoneal and omental nodules identified by prior examination are diminished in size, consistent with treatment response. 3. Multiple tiny pulmonary nodules unchanged, most likely benign and incidental, however continued attention on follow-up warranted in the setting of known metastatic disease. 4. No evidence of new metastatic disease in the chest, abdomen, or pelvis. 5. Status post partial right hemicolectomy and reanastomosis. 6. Diffuse mosaic attenuation of the airspaces, consistent with small airways disease. 7. Hepatomegaly.   10/02/2023 -  Chemotherapy   Patient is on Treatment Plan : COLORECTAL Bevacizumab  q21d        Discussed the use of AI scribe software for clinical note transcription with the patient, who gave verbal consent to proceed.  History of Present Illness Kim Castro is a 78 year old female with metastatic colon cancer who presents for follow-up.  She is on oral chemotherapy, taking three tablets in the morning and four in the evening. Neuropathy has worsened, especially in her hands, with redness and dryness at the fingertips. She uses lotion, gloves, and Voltaren for management. Her energy levels are good but reduced, allowing her to perform activities like yard work, though it takes longer. She experiences body aches after mowing her lawn.  She is on Eliquis  for a previous blood clot, with no current issues. Her potassium and 50 mg medication have been refilled, and she is stable on her 25 mg dose. No fever is present, and she has a slight cough due to allergies.  Occasionally, she has small, itchy scabs that do not bleed.     All other systems were reviewed with the patient and are negative.  MEDICAL HISTORY:  Past Medical History:  Diagnosis Date   Aortic atherosclerosis (HCC)    Arthritis    feet, lower back   Basal cell carcinoma    arm   BRCA gene mutation negative    Breast cancer of upper-outer quadrant of left female breast (HCC) 06/04/2014   Cataract    immature on the left   Colon cancer (HCC) 08/2019   Diabetes mellitus without complication (HCC)    Diverticulosis    Dizziness    > 53yrs ago;took Antivert    Family history of anesthesia complication    sister slow to wake up with anesthesia   Family history of breast cancer    Family history of colon cancer    Family history of uterine cancer    GERD (gastroesophageal reflux disease)    takes occasional TUMs   History of bronchitis    > 41yrs ago   History of colon polyps    History of hiatal hernia    Small noted on CT   History of pulmonary embolus (PE)    Hypertension    takes Losartan  daily and HCTZ   Iron deficiency anemia    Joint pain    Metastatic disease (HCC) 2023   peritoneum and liver mets   Numbness    to toes on each foot   Peripheral neuropathy    feet and toes   Personal history of radiation therapy    Pulmonary nodules    Noted on CT   Radiation 07/31/14-08/28/14   Left Breast 20 fxs   Seasonal allergies    takes Claritin prn   Urinary frequency    Vitamin D deficiency    takes  VIt D daily    SURGICAL HISTORY: Past Surgical History:  Procedure Laterality Date   BREAST BIOPSY Bilateral    BREAST LUMPECTOMY Left    BREAST LUMPECTOMY WITH RADIOACTIVE SEED LOCALIZATION Left 07/01/2014   Procedure: LEFT BREAST LUMPECTOMY WITH RADIOACTIVE SEED LOCALIZATION;  Surgeon: Morene Olives, MD;  Location: [facility];  Service: General;  Laterality: Left;   CATARACT EXTRACTION Right    COLONOSCOPY  09/24/2019   [place]    LAPAROSCOPIC PARTIAL COLECTOMY N/A 11/01/2019   Procedure: LAPAROSCOPIC PARTIAL COLECTOMY;  Surgeon: [name], MD;  Location: [facility];  Service: General;  Laterality: N/A;   POLYPECTOMY     PORTACATH PLACEMENT N/A 12/10/2019   Procedure: INSERTION PORT-A-CATH ULTRASOUND GUIDED IN RIGHT IJ;  Surgeon: [name], MD;  Location: [facility];  Service: General;  Laterality: N/A;   ROBOTIC ASSISTED TOTAL HYSTERECTOMY Bilateral 12/22/2021   Procedure: XI ROBOTIC ASSISTED TOTAL HYSTERECTOMY WITH BILATERAL SALPINGO OOPHORECTOMY;  Surgeon: Viktoria Comer SAUNDERS, MD;  Location: [facility];  Service: Gynecology;  Laterality: Bilateral;   TOTAL KNEE ARTHROPLASTY Left 10/24/2012   Procedure: TOTAL KNEE ARTHROPLASTY;  Surgeon: Dempsey JINNY Sensor, MD;  Location: [facility];  Service: Orthopedics;  Laterality: Left;   TOTAL KNEE ARTHROPLASTY Right 01/09/2013   Procedure: TOTAL KNEE ARTHROPLASTY;  Surgeon: Dempsey JINNY Sensor, MD;  Location: [facility];  Service: Orthopedics;  Laterality: Right;   TUBAL LIGATION      I have reviewed the social history and family history with the patient and they are unchanged from previous note.  ALLERGIES:  is allergic to oxycodone .  MEDICATIONS:  Current Outpatient Medications  Medication Sig Dispense Refill   b complex vitamins capsule Take 1 capsule by mouth daily.     capecitabine  (XELODA ) 500 MG tablet Take 3 tabs in morning and 4 tabs in evening. Take within 30 minutes after meals. Take for 7 days on and 7 days off, then repeat. 98 tablet 0   Cholecalciferol (VITAMIN D) 2000 UNITS CAPS Take 2,000 Units by mouth daily.      ELIQUIS  5 MG TABS tablet Take 1 tablet by mouth twice daily 60 tablet 0   famotidine (PEPCID) 20 MG tablet Take 20 mg by mouth daily.     ibuprofen (ADVIL) 200 MG tablet Take 200 mg by mouth daily as needed (pain).     lidocaine -prilocaine  (EMLA ) cream Apply 1 Application topically as needed. 30 g 1   loratadine (CLARITIN) 10 MG tablet Take 10 mg by mouth daily as  needed for allergies.     losartan  (COZAAR ) 25 MG tablet Take 1 tablet by mouth once daily 90 tablet 2   metFORMIN  (GLUCOPHAGE ) 500 MG tablet Take 1 tablet (500 mg total) by mouth in the morning and at bedtime. 180 tablet 3   Multiple Vitamins-Minerals (MULTIVITAMIN WITH MINERALS) tablet Take 1 tablet by mouth daily.     ondansetron  (ZOFRAN ) 8 MG tablet Take 1 tablet (8 mg total) by mouth every 8 (eight) hours as needed for nausea or vomiting. Start on the third day after chemotherapy. 30 tablet 1   potassium chloride  (KLOR-CON ) 10 MEQ tablet Take 1 tablet by mouth twice daily 60 tablet 0   pravastatin  (PRAVACHOL ) 10 MG tablet Take 1 tablet by mouth once daily 90 tablet 3   pregabalin  (LYRICA ) 25 MG capsule TAKE 1 CAPSULE BY MOUTH ONCE DAILY IN  ADDITION  TO  50MG   AT  BEDTIME 30 capsule 0   pregabalin  (LYRICA ) 50 MG capsule TAKE 1 CAPSULE  BY MOUTH TWICE DAILY IN ADDITION TO THE 25 MG CAPSULE AS BEDTIME FOR A TOTAL OF 75 MG AT BEDTIME. 60 capsule 0   prochlorperazine  (COMPAZINE ) 10 MG tablet Take 1 tablet (10 mg total) by mouth every 6 (six) hours as needed for nausea or vomiting. 30 tablet 1   traMADol  (ULTRAM ) 50 MG tablet Take 1 tablet (50 mg total) by mouth every 6 (six) hours as needed. 30 tablet 0   triamcinolone  ointment (KENALOG ) 0.5 % Apply 1 Application topically 2 (two) times daily. 30 g 0   No current facility-administered medications for this visit.   Facility-Administered Medications Ordered in Other Visits  Medication Dose Route Frequency Provider Last Rate Last Admin   0.9 %  sodium chloride  infusion   Intravenous Continuous [name], MD 10 mL/hr at 12/25/23 0827 New Bag at 12/25/23 0827   bevacizumab -awwb (MVASI ) 700 mg in sodium chloride  0.9 % 100 mL chemo infusion  7.5 mg/kg (Treatment Plan Recorded) Intravenous Once [name], MD        PHYSICAL EXAMINATION: ECOG PERFORMANCE STATUS: 1 - Symptomatic but completely ambulatory  Vitals:   12/25/23 0800  BP: 138/66  Pulse:  62  Resp: 17  Temp: 98.3 F (36.8 C)  SpO2: 97%   Wt Readings from Last 3 Encounters:  12/25/23 210 lb 4.8 oz (95.4 kg)  12/13/23 208 lb 3.2 oz (94.4 kg)  12/04/23 209 lb (94.8 kg)     GENERAL:alert, no distress and comfortable SKIN: skin color, texture, turgor are normal, no rashes or significant lesions except for mild skin erythema in palms. EYES: normal, Conjunctiva are pink and non-injected, sclera clear Musculoskeletal:no cyanosis of digits and no clubbing  NEURO: alert & oriented x 3 with fluent speech, no focal motor/sensory deficits  Physical Exam   LABORATORY DATA:  I have reviewed the data as listed    Latest Ref Rng & Units 12/25/2023    7:32 AM 12/04/2023    7:38 AM 11/13/2023    8:32 AM  CBC  WBC 4.0 - 10.5 K/uL 7.4  4.2  5.5   Hemoglobin 12.0 - 15.0 g/dL 88.9  89.3  89.7   Hematocrit 36.0 - 46.0 % 32.5  32.0  30.7   Platelets 150 - 400 K/uL 189  143  152         Latest Ref Rng & Units 12/04/2023    7:38 AM 11/13/2023    8:32 AM 10/02/2023    8:39 AM  CMP  Glucose 70 - 99 mg/dL 899  896  96   BUN 8 - 23 mg/dL 20  25  21    Creatinine 0.44 - 1.00 mg/dL 9.31  9.27  9.12   Sodium 135 - 145 mmol/L 140  141  142   Potassium 3.5 - 5.1 mmol/L 4.4  4.3  4.3   Chloride 98 - 111 mmol/L 109  111  115   CO2 22 - 32 mmol/L 25  25  21    Calcium  8.9 - 10.3 mg/dL 9.3  9.5  8.9   Total Protein 6.5 - 8.1 g/dL 6.2  6.2  6.4   Total Bilirubin 0.0 - 1.2 mg/dL 2.0  1.3  0.5   Alkaline Phos 38 - 126 U/L 101  108  140   AST 15 - 41 U/L 40  37  20   ALT 0 - 44 U/L 28  26  14        RADIOGRAPHIC STUDIES: I have personally reviewed the radiological images as  listed and agreed with the findings in the report. No results found.    Orders Placed This Encounter  Procedures   CBC with Differential (Cancer Center Only)    Standing Status:   Future    Expected Date:   02/05/2024    Expiration Date:   02/04/2025   Total Protein, Urine dipstick    Standing Status:   Future     Expected Date:   02/05/2024    Expiration Date:   02/04/2025   CBC with Differential (Cancer Center Only)    Standing Status:   Future    Expected Date:   02/26/2024    Expiration Date:   02/25/2025   Total Protein, Urine dipstick    Standing Status:   Future    Expected Date:   02/26/2024    Expiration Date:   02/25/2025   All questions were answered. The patient knows to call the clinic with any problems, questions or concerns. No barriers to learning was detected. The total time spent in the appointment was 30 minutes, including review of chart and various tests results, discussions about plan of care and coordination of care plan     Onita Mattock, MD 12/25/2023

## 2023-12-25 NOTE — Assessment & Plan Note (Signed)
 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 Path showed overall Stage IIIB cancer.  -s/p 6 months adjuvant FOLFOX, completed 06/01/20, oxaliplatin  held for final 2 cycles due to neuropathy.  -s/p BSO and hysterectomy on 12/22/21 with Dr. Viktoria. Path revealed metastatic moderately differentiated colonic adenocarcinoma involving both ovaries and peritoneum -she restarted FOLFOX on 01/24/22. -Due to disease progression, chemotherapy changed to second line FOLFIRI and bevacizumab  on April 06, 2022. She has been tolerating moderately well, better now with dose reduction, will continue -Restaging CT scan from 09/27/2022 showed continous response in liver and peritoneal metastasis, no other new lesions.   -We discussed maintenance therapy option in future  -due to her PE in 10/2022, will hold beva for 3 months  -restaging CT 12/31/2022 showed stable disease and live met is not well defined on CT. She restarted beva on 01/17/23  -restaging CT 06/19/2023 showed overall stable disease (stable small lung nodules, no other evidence of cancer) Restaging CT 09/12/2023 showed no residual radiographic evidence of disease.   - We discussed option of maintenance therapy, treatment changed to capecitabine  and bevacizumab  on 10/02/2023 -CT 12/18/23 showed stable disease

## 2023-12-30 ENCOUNTER — Other Ambulatory Visit: Payer: Self-pay | Admitting: Nurse Practitioner

## 2023-12-30 ENCOUNTER — Other Ambulatory Visit: Payer: Self-pay | Admitting: Hematology

## 2023-12-30 DIAGNOSIS — R200 Anesthesia of skin: Secondary | ICD-10-CM

## 2024-01-02 ENCOUNTER — Other Ambulatory Visit: Payer: Self-pay

## 2024-01-02 ENCOUNTER — Encounter: Payer: Self-pay | Admitting: Hematology

## 2024-01-04 ENCOUNTER — Other Ambulatory Visit: Payer: Self-pay

## 2024-01-08 ENCOUNTER — Telehealth: Payer: Self-pay

## 2024-01-08 ENCOUNTER — Other Ambulatory Visit (HOSPITAL_BASED_OUTPATIENT_CLINIC_OR_DEPARTMENT_OTHER): Payer: Self-pay

## 2024-01-08 ENCOUNTER — Encounter: Payer: Self-pay | Admitting: Hematology

## 2024-01-08 ENCOUNTER — Other Ambulatory Visit: Payer: Self-pay

## 2024-01-08 ENCOUNTER — Other Ambulatory Visit: Payer: Self-pay | Admitting: Hematology

## 2024-01-08 ENCOUNTER — Other Ambulatory Visit (HOSPITAL_COMMUNITY): Payer: Self-pay

## 2024-01-08 DIAGNOSIS — C182 Malignant neoplasm of ascending colon: Secondary | ICD-10-CM

## 2024-01-08 MED ORDER — NYSTATIN 100000 UNIT/ML MT SUSP
5.0000 mL | Freq: Four times a day (QID) | OROMUCOSAL | 1 refills | Status: DC | PRN
  Filled 2024-01-08: qty 140, 7d supply, fill #0

## 2024-01-08 MED ORDER — NYSTATIN 100000 UNIT/ML MT SUSP
OROMUCOSAL | 1 refills | Status: AC
  Filled 2024-01-08: qty 140, 7d supply, fill #0

## 2024-01-08 MED ORDER — CAPECITABINE 500 MG PO TABS
ORAL_TABLET | ORAL | 0 refills | Status: DC
  Filled 2024-01-08: qty 98, 28d supply, fill #0

## 2024-01-08 NOTE — Telephone Encounter (Signed)
 Patient called in stating she has blisters along her upper and lower lip that are very painful to the touch. Having a hard time eating due to pain. They started on Thursday noticed them and they have gotten worse over the weekend. Told patient we would get a message to Dr. Lanny and would call her back with what she wants her to do. Patient voiced full understanding and said she would await a call back.

## 2024-01-08 NOTE — Progress Notes (Signed)
 Called patient back as per Lacie Burton NP, Lacie stated she should stop taking her Xeloda  til her appointment on 09/15 and called in Magic mouthwash for her to [facility]. Patient had no further questions at this time.

## 2024-01-08 NOTE — Progress Notes (Signed)
 Specialty Pharmacy Refill Coordination Note  Kim Castro is a 78 y.o. female contacted today regarding refills of specialty medication(s) Capecitabine  (XELODA )   Patient requested Kim Castro at [facility] at [place] date: 01/15/24   Medication will be filled on 01/12/24. This fill date is pending response to refill request from provider. Patient is aware and if they have not received fill by intended date they must follow up with pharmacy.

## 2024-01-08 NOTE — Progress Notes (Signed)
 Specialty Pharmacy Ongoing Clinical Assessment Note  Kim Castro is a 78 y.o. female who is being followed by the specialty pharmacy service for [facility] Oncology   Patient's specialty medication(s) reviewed today: Capecitabine  (XELODA )   Missed doses in the last 4 weeks: 0   Patient/Caregiver did not have any additional questions or concerns.   Therapeutic benefit summary: Patient is achieving benefit   Adverse events/side effects summary: Experienced adverse events/side effects (mouth sores (patient has called the doctor for a prescription to help with these and is awaiting a call back), neuropathy in hands and feet (Voltaren is helping with this))   Patient's therapy is appropriate to: Continue    Goals Addressed             This Visit's Progress    Slow Disease Progression   On track    Patient is on track. Patient will maintain adherence. The CT from 12/18/23 showed stable disease.          Follow up: 3 months  Kim Castro Specialty Pharmacist

## 2024-01-09 ENCOUNTER — Other Ambulatory Visit (HOSPITAL_COMMUNITY): Payer: Self-pay

## 2024-01-09 NOTE — Progress Notes (Signed)
 Patient called Specialty Pharmacy. Per MD, patient should hold medication until she is seen next week (appt on 01/15/24). Patient has 7 days on hand, and will then start her off week.  I recommended we can change her next pick up date during her off week (01/24/24). Patient will call back if MD decides to stop or change medication and we will go from there. Patient expressed gratitude and understanding.

## 2024-01-10 ENCOUNTER — Other Ambulatory Visit: Payer: Self-pay | Admitting: Nurse Practitioner

## 2024-01-10 NOTE — Assessment & Plan Note (Addendum)
 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 Path showed overall Stage IIIB cancer.  -s/p 6 months adjuvant FOLFOX, completed 06/01/20, oxaliplatin  held for final 2 cycles due to neuropathy.  -s/p BSO and hysterectomy on 12/22/21 with Dr. Viktoria. Path revealed metastatic moderately differentiated colonic adenocarcinoma involving both ovaries and peritoneum -she restarted FOLFOX on 01/24/22. -Due to disease progression, chemotherapy changed to second line FOLFIRI and bevacizumab  on April 06, 2022. She has been tolerating moderately well, better now with dose reduction, will continue -Restaging CT scan from 09/27/2022 showed continous response in liver and peritoneal metastasis, no other new lesions.   -We discussed maintenance therapy option in future  -due to her PE in 10/2022, will hold beva for 3 months  -restaging CT 12/31/2022 showed stable disease and live met is not well defined on CT. She restarted beva on 01/17/23  -restaging CT 06/19/2023 showed overall stable disease (stable small lung nodules, no other evidence of cancer) Restaging CT 09/12/2023 showed no residual radiographic evidence of disease.   - We discussed option of maintenance therapy, treatment changed to capecitabine  and bevacizumab  on 10/02/2023 -CT 12/18/23 showed stable disease - Continue oral Xeloda , 3 pills in the morning and 4 pills in the evening, for 1 week off and 1 week on.  38 bevacizumab  every 3 weeks.

## 2024-01-10 NOTE — Progress Notes (Signed)
 Patient Care Team: [name], MD as PCP - General (Family Medicine) [name], MD as Consulting Physician (General Surgery) [name], MD as Consulting Physician (Hematology) [name], MD as Consulting Physician (General Surgery) [name], MD as Consulting Physician (Hematology and Oncology) [facility], [facility]   Clinic Day:  01/15/2024  Referring physician: [name], MD  ASSESSMENT & PLAN:   Assessment & Plan: Malignant neoplasm of ascending colon [facility]) pT3N2aM0 stage IIB, MSS, metastatic to b/l ovaries and peritoneum and liver, KRAS mutation G12V (+)  -Initially diagnosed in 08/2019, s/p resection with Dr Debby on 11/01/19. Path showed overall Stage IIIB cancer.  -s/p 6 months adjuvant FOLFOX, completed 06/01/20, oxaliplatin  held for final 2 cycles due to neuropathy.  -s/p BSO and hysterectomy on 12/22/21 with Dr. Viktoria. Path revealed metastatic moderately differentiated colonic adenocarcinoma involving both ovaries and peritoneum -she restarted FOLFOX on 01/24/22. -Due to disease progression, chemotherapy changed to second line FOLFIRI and bevacizumab  on April 06, 2022. She has been tolerating moderately well, better now with dose reduction, will continue -Restaging CT scan from 09/27/2022 showed continous response in liver and peritoneal metastasis, no other new lesions.   -We discussed maintenance therapy option in future  -due to her PE in 10/2022, will hold beva for 3 months  -restaging CT 12/31/2022 showed stable disease and live met is not well defined on CT. She restarted beva on 01/17/23  -restaging CT 06/19/2023 showed overall stable disease (stable small lung nodules, no other evidence of cancer) Restaging CT 09/12/2023 showed no residual radiographic evidence of disease.   - We discussed option of maintenance therapy, treatment changed to capecitabine  and bevacizumab  on 10/02/2023 -CT 12/18/23 showed stable disease - Continue  oral Xeloda , 3 pills in the morning and 4 pills in the evening, for 1 week off and 1 week on.  38 bevacizumab  every 3 weeks.  Chemotherapy induced peripheral neuropathy This is no significant in the fingertips and worse with physical activity.  She states this is improved with extra week off Xeloda .  She has been wearing gloves when standing and exertional activities.  She is using Voltaren gel and hand lotion to help with painful neuropathy which is beneficial.  In dermatitis due to chemotherapy Continues to have redness and dryness of the hands, especially fingertips.  She is currently managing this with lotion and Voltaren gel.  She has found this regimen to be beneficial and will continue to use this routinely.  Hypertension States blood pressure has been running elevated.  Today, blood pressure 140/80.  Taking blood pressure medicines as prescribed.  Her primary care.  Right sided colon cancer Reviewed CT CAP from 12/18/2023 which showed no evidence of recurrence or metastatic disease in the chest, abdomen, or pelvis.  Tolerating chemotherapy Xeloda  and bevacizumab  well.  Manageable side effects.  Continue Xeloda , 3 tablets in the morning and 4 tablets in the evening, for 1 week on 1 week off.  IV bevacizumab  to be continued for 21 days.  Plan Lab reviewed. --CBC showing mild and stable anemia. Will continue to monitor.  --CMP unremarkable. Continue potassium supplementation.  -- Negative urine protein. Conitnue with Xeloda  with 3 tablets in the morning and 4 tablets in the evening.  This is one week on and one week off. Managing well.  Proceed with bevacizumab  as scheduled. Continue with IV bevacizumab  every 3 weeks.   The patient understands the plans discussed today and is in agreement with them.  She knows to contact  our office if she develops concerns prior to her next appointment.  I provided 25 minutes of face-to-face time during this encounter and > 50% was spent counseling as  documented under my assessment and plan.    Powell FORBES Lessen, NP   CANCER CENTER [facility] - A DEPT OF [facility] [address] [place] [address] Dept: [phone] Dept Fax: [phone]   No orders of the defined types were placed in this encounter.     CHIEF COMPLAINT:  CC: malignant neoplasm of ascending colon  Current Treatment: Maintenance Xeloda  and bevacizumab   INTERVAL HISTORY:  Kim Castro is here today for repeat clinical assessment.  She last saw Dr. Lanny on 12/25/2023.  CT CAP on 12/18/2023 showed stable disease.  Has had some mild skin toxicity related to Xeloda .  Bevacizumab  administered IV every 3 weeks.  Overall, she is managing well.  She states blood pressure has been mildly elevated however, is currently well-managed.  She denies chest pain, chest pressure, or shortness of breath. She denies headaches or visual disturbances. She denies abdominal pain, nausea, vomiting, or changes in bowel or bladder habits.  She denies fevers or chills. She denies pain. Her appetite is good. Her weight has been stable.  I have reviewed the past medical history, past surgical history, social history and family history with the patient and they are unchanged from previous note.  ALLERGIES:  is allergic to oxycodone .  MEDICATIONS:  Current Outpatient Medications  Medication Sig Dispense Refill   b complex vitamins capsule Take 1 capsule by mouth daily.     capecitabine  (XELODA ) 500 MG tablet Take 3 tabs in morning and 4 tabs in evening. Take within 30 minutes after meals. Take for 7 days on and 7 days off, then repeat. 98 tablet 0   Cholecalciferol (VITAMIN D) 2000 UNITS CAPS Take 2,000 Units by mouth daily.      ELIQUIS  5 MG TABS tablet Take 1 tablet by mouth twice daily 60 tablet 0   famotidine (PEPCID) 20 MG tablet Take 20 mg by mouth daily.     ibuprofen (ADVIL) 200 MG tablet Take 200 mg by mouth daily as needed (pain).      lidocaine -prilocaine  (EMLA ) cream Apply 1 Application topically as needed. 30 g 1   loratadine (CLARITIN) 10 MG tablet Take 10 mg by mouth daily as needed for allergies.     losartan  (COZAAR ) 25 MG tablet Take 1 tablet by mouth once daily 90 tablet 2   magic mouthwash (nystatin , lidocaine , diphenhydrAMINE , alum & mag hydroxide) suspension Swish and swallow 4 times daily as needed for mouth pain. 140 mL 1   metFORMIN  (GLUCOPHAGE ) 500 MG tablet Take 1 tablet (500 mg total) by mouth in the morning and at bedtime. 180 tablet 3   Multiple Vitamins-Minerals (MULTIVITAMIN WITH MINERALS) tablet Take 1 tablet by mouth daily.     ondansetron  (ZOFRAN ) 8 MG tablet Take 1 tablet (8 mg total) by mouth every 8 (eight) hours as needed for nausea or vomiting. Start on the third day after chemotherapy. 30 tablet 1   potassium chloride  (KLOR-CON ) 10 MEQ tablet Take 1 tablet by mouth twice daily 60 tablet 0   pravastatin  (PRAVACHOL ) 10 MG tablet Take 1 tablet by mouth once daily 90 tablet 3   pregabalin  (LYRICA ) 50 MG capsule TAKE 1 CAPSULE(50MG ) BY MOUTH TWICE DAILY IN ADDITION TO THE 25 MG CAPSULE AT BEDTIME FOR A TOTAL OF 75MG  AT BEDTIME.  60 capsule 0   prochlorperazine  (COMPAZINE ) 10 MG tablet Take 1 tablet (10 mg total) by mouth every 6 (six) hours as needed for nausea or vomiting. 30 tablet 1   traMADol  (ULTRAM ) 50 MG tablet Take 1 tablet (50 mg total) by mouth every 6 (six) hours as needed. 30 tablet 0   triamcinolone  ointment (KENALOG ) 0.5 % Apply 1 Application topically 2 (two) times daily. 30 g 0   pregabalin  (LYRICA ) 25 MG capsule TAKE 1 CAPSULE BY MOUTH ONCE DAILY IN ADDITION TO 50 MG  AT BEDTIME 30 capsule 1   No current facility-administered medications for this visit.    HISTORY OF PRESENT ILLNESS:   Oncology History Overview Note   Cancer Staging  Malignant neoplasm of female breast [facility]) Staging form: Breast, AJCC 7th Edition - Clinical stage from 06/11/2014: Stage 0 (Tis (DCIS), N0, M0) -  Unsigned Staged by: Pathologist and managing physician Laterality: Left Estrogen receptor status: Positive Progesterone receptor status: Positive Stage used in treatment planning: Yes National guidelines used in treatment planning: Yes Type of national guideline used in treatment planning: NCCN - Pathologic stage from 07/03/2014: Stage Unknown (Tis (DCIS), NX, cM0) - Signed by Guinevere Chew, MD on 07/10/2014 Staged by: Pathologist Laterality: Left Estrogen receptor status: Positive Progesterone receptor status: Positive Stage used in treatment planning: Yes National guidelines used in treatment planning: Yes Type of national guideline used in treatment planning: NCCN Staging comments: Staged on final lumpectomy specimen by Dr. Loral.  Right colon cancer Staging form: Colon and Rectum, AJCC 8th Edition - Pathologic stage from 11/01/2019: Stage IIIB (pT3, pN2a, cM0) - Signed by [name], MD on 11/29/2019 Stage prefix: Initial diagnosis Histologic grading system: 4 grade system Histologic grade (G): G2 Residual tumor (R): R0 - None Tumor deposits (TD): Absent Perineural invasion (PNI): Absent Microsatellite instability (MSI): Stable KRAS mutation: Unknown NRAS mutation: Unknown BRAF mutation: Unknown     Malignant neoplasm of female breast (HCC)  05/29/2014 Initial Biopsy   Left breast needle core biopsy: Grade 2, DCIS with calcs. ER+ (100%), PR+ (96%).    06/04/2014 Initial Diagnosis   Left breast DCIS with calcifications, ER 100%, PR 96%   06/10/2014 Breast MRI   Left breast: 2.4 x 1.3 x 1.1 cm area of patchy non-mass enhancement upper outer quadrant includes postbiopsy seroma; Right breast: 1.2 cm previously biopsied stable benign fibroadenoma   06/12/2014 Procedure   Genetic counseling/testing: Identified 1 VUS on CHEK2 gene. Remainder of 17 gene panel tested negative and included: ATM, BARD1, BRCA 1/2, BRIP1, CDH1, CHEK2, EPCAM, MLH1, MSH2, MSH6, NBN, NF1, PALB2, PTEN, RAD50,  RAD51C, RAD51D, STK11, and TP53.    07/01/2014 Surgery   Left breast lumpectomy ([facility]): Grade 1, DCIS, spanning 2.3 cm, 1 mm margin, ER 100%, PR 96%   07/31/2014 - 08/28/2014 Radiation Therapy   Adjuvant RT completed Signe). Left breast: Total dose 42.5 Gy over 17 fractions. Left breast boost: Total dose 7.5 Gy over 3 fractions.    09/14/2014 - 01/13/2020 Anti-estrogen oral therapy   Anastrazole 1mg  daily. Planned duration of treatment: 5 years [place]). Completed in 01/2020.    09/25/2014 Survivorship   Survivorship Care Plan given to patient and reviewed with her in person.    03/02/2021 Imaging   CT CAP  IMPRESSION: 1. No findings of active/recurrent malignancy. Partial right hemicolectomy. 2. Endometrial stripe remains mildly thickened, but endometrial biopsy in May was negative for malignancy. 3. Progressive endplate sclerosis and endplate irregularity at T2-3, probably due to degenerative endplate findings.  If the has referable upper thoracic pain/symptoms then thoracic spine MRI could be used for further workup. 4. Other imaging findings of potential clinical significance: Mild cardiomegaly. Aortic Atherosclerosis (ICD10-I70.0). Mild mitral valve calcification. Postoperative findings in the left breast with adjacent radiation port anteriorly in the left upper lobe. Tiny pulmonary nodules in the left lower lobe are unchanged from earliest available comparison of 10/17/2019 and probably benign although may merit surveillance. Multilevel lumbar impingement. Mild pelvic floor laxity.   Malignant neoplasm of ascending colon (HCC)  09/19/2019 Imaging   CT AP W contrast 09/19/19  IMPRESSION Fullness in the cecum, cannot exclude a mass. No evidence for metastatic disease is identified.    09/24/2019 Procedure   Colonoscopy by Dr Ernesto 09/24/19 IMPRESSION 1. The colon was redundant  2. Mild diverticulosis was noted through the entire examined colon 3. Single 12mm polyp was found  in the ascending colon; polypectomy was performed using snare cautery and biopsy forceps 4. Mild diverticulosis was notes in the descending colon and sigmoid colon.  5. Single polyp was found in the sigmoid colon, polypectomy was performed with cold forceps.  6. Single polyp was found in the rectosigmoid colon; polypectomy was performed with cold snare  7. Small internal hemorrhoids  8. Large mass was found at the cecum; multiple biopsies of the area were performed using cold forceps; injection (tattooing) was performed distal to the mass.    09/24/2019 Initial Biopsy   INTERPRETATION AND DIAGNOSIS:  A. Cecum, biopsy:  Invasive moderately differentiated adenocarcinoma.  see comment  B. Polyp @ ascending colon, polypectomy:  Tubular Adenoma  C. Polyp @ sigmoid colon Polypectomy:  hyperplastic polyp.  D. Polyp @ rectosigmoid colon, Polypectomy:  Hyperplastic Polyp      10/16/2019 Imaging   CT Chest IMPRESSION: 1. Multiple small pulmonary nodules measuring 5 mm or less in size in the lungs. These are nonspecific and are typically considered statistically likely benign. However, given the patient's history of primary malignancy, close attention on follow-up studies is recommended to ensure stability. 2. Aortic atherosclerosis, in addition to right coronary artery disease. Assessment for potential risk factor modification, dietary therapy or pharmacologic therapy may be warranted, if clinically indicated. 3. There are calcifications of the aortic valve and mitral annulus. Echocardiographic correlation for evaluation of potential valvular dysfunction may be warranted if clinically indicated. 4. Small hiatal hernia.   Aortic Atherosclerosis (ICD10-I70.0).   11/01/2019 Initial Diagnosis   Colon cancer (HCC)   11/01/2019 Surgery   LAPAROSCOPIC PARTIAL COLECTOMY by Dr Debby and Dr Sheldon   11/01/2019 Pathology Results   FINAL MICROSCOPIC DIAGNOSIS:   A. COLON, PROXIMAL RIGHT, COLECTOMY:   - Invasive colonic adenocarcinoma, 5 cm.  - Tumor invades through the muscularis propria into pericolonic tissues.   - Margins of resection are not involved.  - Metastatic carcinoma in (5) of (13) lymph nodes.  - See oncology table.    MSI Stable  Mismatch repair normal  MLH1 - Preserved nuclear expression (greater 50% tumor expression) MSH2 - Preserved nuclear expression (greater 50% tumor expression) MSH6 - Preserved nuclear expression (greater 50% tumor expression) PMS2 - Preserved nuclear expression (greater 50% tumor expression)   11/01/2019 Cancer Staging   Staging form: Colon and Rectum, AJCC 8th Edition - Pathologic stage from 11/01/2019: Stage IIIB (pT3, pN2a, cM0) - Signed by [name], MD on 11/29/2019   12/10/2019 Procedure   PAC placed 12/10/19   12/17/2019 - 06/01/2020 Chemotherapy   FOLFOX q2weeks starting in 2 weeks starting 12/17/19. Held 01/27/20-02/10/20  due to b/l PE. Oxaliplatin  held C11-12 due to neuropathy. Completed on 06/01/20   03/02/2021 Imaging   CT CAP  IMPRESSION: 1. No findings of active/recurrent malignancy. Partial right hemicolectomy. 2. Endometrial stripe remains mildly thickened, but endometrial biopsy in May was negative for malignancy. 3. Progressive endplate sclerosis and endplate irregularity at T2-3, probably due to degenerative endplate findings. If the has referable upper thoracic pain/symptoms then thoracic spine MRI could be used for further workup. 4. Other imaging findings of potential clinical significance: Mild cardiomegaly. Aortic Atherosclerosis (ICD10-I70.0). Mild mitral valve calcification. Postoperative findings in the left breast with adjacent radiation port anteriorly in the left upper lobe. Tiny pulmonary nodules in the left lower lobe are unchanged from earliest available comparison of 10/17/2019 and probably benign although may merit surveillance. Multilevel lumbar impingement. Mild pelvic floor laxity.   08/26/2021 Imaging    EXAM: CT CHEST, ABDOMEN, AND PELVIS WITH CONTRAST  IMPRESSION: 1. Stable examination without new or progressive findings to suggest local recurrence or metastatic disease within the chest, abdomen, or pelvis. 2. Hepatomegaly with hepatic steatosis. 3. Sigmoid colonic diverticulosis without findings of acute diverticulitis. 4. Similar prominent endplate sclerosis and irregularity at T2-T3 is most consistent with Modic type endplate degenerative changes. However, if patient has referable upper thoracic pain consider further workup with thoracic spine MRI. 5. Similar thickening of the endometrial stripe measuring up to 8 mm, which was previously biopsied with results negative for malignancy. 6. Aortic Atherosclerosis (ICD10-I70.0).   11/10/2021 PET scan   IMPRESSION: 1. LEFT ovary is increased in size and is intensely hypermetabolic. While physiologic hypermetabolic ovarian tissue is not uncommon, the enlargement and asymmetric activity warrants further evaluation. Consider contrast pelvic MRI vs tissue sampling. 2. No evidence of metastatic colorectal carcinoma otherwise. 3. Post RIGHT hemicolectomy anatomy. 4. Evidence of radiation change in the LEFT upper lobe (remote breast cancer).     12/22/2021 Relapse/Recurrence    FINAL MICROSCOPIC DIAGNOSIS:   A. LEFT OVARY AND FALLOPIAN TUBE, SALPINGO OOPHORECTOMY:  - Metastatic moderately differentiated colonic adenocarcinoma involving left ovary  - Focal ovarian stromal calcification  - Segment of benign fallopian tube   B. UTERUS WITH RIGHT FALLOPIAN TUBE AND OVARY, HYSTERECTOMY AND SALPINGO-OOPHORECTOMY:  - Metastatic moderately differentiated colonic adenocarcinoma involving right ovary  - Focal invasive extensive adenomyosis  - Benign endometrial polyps  - Benign proliferative phase endometrium  - Hydrosalpinx of right fallopian tube  - Focal ovarian stromal calcification   C. PERITONEAL DEPOSITS, ANTERIOR CUL DE SAC, BIOPSY:  -  Metastatic mucinous adenocarcinoma, consistent with colorectal primary    COMMENT:  Immunohistochemical stains show that the tumor cells are positive for CK20 and CDX2 while they are negative for CK7 and PAX8, consistent with above interpretation.    01/24/2022 - 03/23/2022 Chemotherapy   Patient is on Treatment Plan : COLORECTAL FOLFOX q14d x 3 months     04/01/2022 Imaging   CT AP IMPRESSION: 1. New omental metastatic disease. 2. Vague hypoattenuating lesion in the inferior right hepatic lobe is new and also worrisome for metastatic disease. 3. Similar small right lower lobe nodules. Recommend continued attention on follow-up. 4. Steatotic enlarged liver. 5.  Aortic atherosclerosis (ICD10-I70.0).   04/06/2022 - 09/18/2023 Chemotherapy   Patient is on Treatment Plan : COLORECTAL FOLFIRI + Bevacizumab  q14d     04/21/2022 Imaging    IMPRESSION: 1. Stable chest CT. No evidence of metastatic disease. 2. Stable small pulmonary nodules, considered benign based on stability. 3. Stable postsurgical changes in the left breast  and anterior left upper lobe radiation changes. 4. Aortic Atherosclerosis (ICD10-I70.0) and Emphysema (ICD10-J43.9).   04/21/2022 Imaging    IMPRESSION: 1. Small enhancing lesion inferiorly in the right hepatic lobe is typical of metastatic disease and stable from recent abdominal CT. No other definite liver lesions are identified. Mild hepatic steatosis. 2. No other significant abdominal findings. 3. Omental nodularity seen on CT is not well visualized by MRI.    Imaging     06/23/2022 Imaging    IMPRESSION: 1. Hypodense lesion of the inferior right lobe of the liver, hepatic segment VI is diminished in size, consistent with treatment response. 2. Tiny peritoneal and omental nodules identified by prior examination are diminished in size, consistent with treatment response. 3. Multiple tiny pulmonary nodules unchanged, most likely benign and incidental,  however continued attention on follow-up warranted in the setting of known metastatic disease. 4. No evidence of new metastatic disease in the chest, abdomen, or pelvis. 5. Status post partial right hemicolectomy and reanastomosis. 6. Diffuse mosaic attenuation of the airspaces, consistent with small airways disease. 7. Hepatomegaly.   10/02/2023 -  Chemotherapy   Patient is on Treatment Plan : COLORECTAL Bevacizumab  q21d     12/18/2023 Imaging   CT CAP with contrast IMPRESSION: 1. Prior partial right hemicolectomy with ileocolic anastomosis. No suspicious nodularity along the suture line. Prominent adjacent lymph nodes are similar to prior. 2. Stable tiny scattered pulmonary nodules. 3. No evidence of new or progressive disease in the chest, abdomen or pelvis. 4. Hepatomegaly with hepatic steatosis. 5. Aortic atherosclerosis.         REVIEW OF SYSTEMS:   Constitutional: Denies fevers, chills or abnormal weight loss Eyes: Denies blurriness of vision Ears, nose, mouth, throat, and face: Denies mucositis or sore throat Respiratory: Denies cough, dyspnea or wheezes Cardiovascular: Denies palpitation, chest discomfort or lower extremity swelling Gastrointestinal:  Denies nausea, heartburn or change in bowel habits Skin: Denies abnormal skin rashes. Continues to have mild skin rash, drying, and cracking of the palms of the hands and fingertips.  Lymphatics: Denies new lymphadenopathy or easy bruising Neurological:Denies numbness, tingling or new weaknesses Behavioral/Psych: Mood is stable, no new changes  All other systems were reviewed with the patient and are negative.   VITALS:   Today's Vitals   01/15/24 0821 01/15/24 0826  BP: (!) 160/80 (!) 140/80  Pulse: (!) 55   Resp: 17   Temp: 98.3 F (36.8 C)   SpO2: 97%   Weight: 209 lb 8 oz (95 kg)   PainSc:  6    Body mass index is 34.33 kg/m.   Wt Readings from Last 3 Encounters:  01/15/24 209 lb 8 oz (95 kg)  12/25/23  210 lb 4.8 oz (95.4 kg)  12/13/23 208 lb 3.2 oz (94.4 kg)    Body mass index is 34.33 kg/m.  Performance status (ECOG): 1 - Symptomatic but completely ambulatory  PHYSICAL EXAM:   GENERAL:alert, no distress and comfortable SKIN: skin color, texture, turgor are normal, no rashes or significant lesions EYES: normal, Conjunctiva are pink and non-injected, sclera clear OROPHARYNX:no exudate, no erythema and lips, buccal mucosa, and tongue normal  NECK: supple, thyroid normal size, non-tender, without nodularity LYMPH:  no palpable lymphadenopathy in the cervical, axillary or inguinal LUNGS: clear to auscultation and percussion with normal breathing effort HEART: regular rate & rhythm and no murmurs and no lower extremity edema ABDOMEN:abdomen soft, non-tender and normal bowel sounds Musculoskeletal:no cyanosis of digits and no clubbing  NEURO: alert & oriented  x 3 with fluent speech, no focal motor/sensory deficits  LABORATORY DATA:  I have reviewed the data as listed    Component Value Date/Time   NA 143 01/15/2024 0737   NA 144 10/03/2014 0914   K 4.4 01/15/2024 0737   K 4.6 10/03/2014 0914   CL 110 01/15/2024 0737   CO2 27 01/15/2024 0737   CO2 29 10/03/2014 0914   GLUCOSE 104 (H) 01/15/2024 0737   GLUCOSE 133 10/03/2014 0914   BUN 21 01/15/2024 0737   BUN 17.8 10/03/2014 0914   CREATININE 0.69 01/15/2024 0737   CREATININE 0.87 10/02/2023 0839   CREATININE 0.8 10/03/2014 0914   CALCIUM  9.6 01/15/2024 0737   CALCIUM  9.7 10/03/2014 0914   PROT 6.3 (L) 01/15/2024 0737   PROT 7.0 10/03/2014 0914   ALBUMIN  3.9 01/15/2024 0737   ALBUMIN  3.7 10/03/2014 0914   AST 30 01/15/2024 0737   AST 20 10/02/2023 0839   AST 18 10/03/2014 0914   ALT 19 01/15/2024 0737   ALT 14 10/02/2023 0839   ALT 13 10/03/2014 0914   ALKPHOS 99 01/15/2024 0737   ALKPHOS 74 10/03/2014 0914   BILITOT 1.7 (H) 01/15/2024 0737   BILITOT 0.5 10/02/2023 0839   BILITOT 0.98 10/03/2014 0914   GFRNONAA  >60 01/15/2024 0737   GFRNONAA >60 10/02/2023 0839   GFRAA >60 01/30/2020 0429   GFRAA >60 01/27/2020 0834    Lab Results  Component Value Date   WBC 5.9 01/15/2024   NEUTROABS 2.4 01/15/2024   HGB 11.4 (L) 01/15/2024   HCT 33.4 (L) 01/15/2024   MCV 111.3 (H) 01/15/2024   PLT 130 (L) 01/15/2024      RADIOGRAPHIC STUDIES: No results found.

## 2024-01-14 ENCOUNTER — Other Ambulatory Visit: Payer: Self-pay | Admitting: Nurse Practitioner

## 2024-01-14 DIAGNOSIS — R200 Anesthesia of skin: Secondary | ICD-10-CM

## 2024-01-15 ENCOUNTER — Inpatient Hospital Stay: Attending: Hematology

## 2024-01-15 ENCOUNTER — Inpatient Hospital Stay (HOSPITAL_BASED_OUTPATIENT_CLINIC_OR_DEPARTMENT_OTHER): Admitting: Nurse Practitioner

## 2024-01-15 ENCOUNTER — Encounter

## 2024-01-15 ENCOUNTER — Inpatient Hospital Stay

## 2024-01-15 ENCOUNTER — Encounter: Payer: Self-pay | Admitting: Hematology

## 2024-01-15 VITALS — BP 140/80 | HR 55 | Temp 98.3°F | Resp 17 | Wt 209.5 lb

## 2024-01-15 DIAGNOSIS — Z5111 Encounter for antineoplastic chemotherapy: Secondary | ICD-10-CM | POA: Diagnosis not present

## 2024-01-15 DIAGNOSIS — C786 Secondary malignant neoplasm of retroperitoneum and peritoneum: Secondary | ICD-10-CM | POA: Insufficient documentation

## 2024-01-15 DIAGNOSIS — C7963 Secondary malignant neoplasm of bilateral ovaries: Secondary | ICD-10-CM | POA: Insufficient documentation

## 2024-01-15 DIAGNOSIS — T451X5A Adverse effect of antineoplastic and immunosuppressive drugs, initial encounter: Secondary | ICD-10-CM

## 2024-01-15 DIAGNOSIS — C182 Malignant neoplasm of ascending colon: Secondary | ICD-10-CM

## 2024-01-15 DIAGNOSIS — Z90710 Acquired absence of both cervix and uterus: Secondary | ICD-10-CM | POA: Diagnosis not present

## 2024-01-15 DIAGNOSIS — G620 Drug-induced polyneuropathy: Secondary | ICD-10-CM | POA: Diagnosis not present

## 2024-01-15 DIAGNOSIS — Z9221 Personal history of antineoplastic chemotherapy: Secondary | ICD-10-CM

## 2024-01-15 DIAGNOSIS — C787 Secondary malignant neoplasm of liver and intrahepatic bile duct: Secondary | ICD-10-CM | POA: Insufficient documentation

## 2024-01-15 DIAGNOSIS — L270 Generalized skin eruption due to drugs and medicaments taken internally: Secondary | ICD-10-CM | POA: Insufficient documentation

## 2024-01-15 DIAGNOSIS — I10 Essential (primary) hypertension: Secondary | ICD-10-CM | POA: Insufficient documentation

## 2024-01-15 DIAGNOSIS — L271 Localized skin eruption due to drugs and medicaments taken internally: Secondary | ICD-10-CM

## 2024-01-15 DIAGNOSIS — Z90722 Acquired absence of ovaries, bilateral: Secondary | ICD-10-CM | POA: Diagnosis not present

## 2024-01-15 LAB — CBC WITH DIFFERENTIAL (CANCER CENTER ONLY)
Abs Immature Granulocytes: 0.02 K/uL (ref 0.00–0.07)
Basophils Absolute: 0 K/uL (ref 0.0–0.1)
Basophils Relative: 1 %
Eosinophils Absolute: 0.3 K/uL (ref 0.0–0.5)
Eosinophils Relative: 5 %
HCT: 33.4 % — ABNORMAL LOW (ref 36.0–46.0)
Hemoglobin: 11.4 g/dL — ABNORMAL LOW (ref 12.0–15.0)
Immature Granulocytes: 0 %
Lymphocytes Relative: 41 %
Lymphs Abs: 2.5 K/uL (ref 0.7–4.0)
MCH: 38 pg — ABNORMAL HIGH (ref 26.0–34.0)
MCHC: 34.1 g/dL (ref 30.0–36.0)
MCV: 111.3 fL — ABNORMAL HIGH (ref 80.0–100.0)
Monocytes Absolute: 0.7 K/uL (ref 0.1–1.0)
Monocytes Relative: 12 %
Neutro Abs: 2.4 K/uL (ref 1.7–7.7)
Neutrophils Relative %: 41 %
Platelet Count: 130 K/uL — ABNORMAL LOW (ref 150–400)
RBC: 3 MIL/uL — ABNORMAL LOW (ref 3.87–5.11)
RDW: 16.9 % — ABNORMAL HIGH (ref 11.5–15.5)
WBC Count: 5.9 K/uL (ref 4.0–10.5)
nRBC: 0 % (ref 0.0–0.2)

## 2024-01-15 LAB — COMPREHENSIVE METABOLIC PANEL WITH GFR
ALT: 19 U/L (ref 0–44)
AST: 30 U/L (ref 15–41)
Albumin: 3.9 g/dL (ref 3.5–5.0)
Alkaline Phosphatase: 99 U/L (ref 38–126)
Anion gap: 6 (ref 5–15)
BUN: 21 mg/dL (ref 8–23)
CO2: 27 mmol/L (ref 22–32)
Calcium: 9.6 mg/dL (ref 8.9–10.3)
Chloride: 110 mmol/L (ref 98–111)
Creatinine, Ser: 0.69 mg/dL (ref 0.44–1.00)
GFR, Estimated: >60 mL/min (ref >60)
Glucose, Bld: 104 mg/dL — ABNORMAL HIGH (ref 70–99)
Potassium: 4.4 mmol/L (ref 3.5–5.1)
Sodium: 143 mmol/L (ref 135–145)
Total Bilirubin: 1.7 mg/dL — ABNORMAL HIGH (ref 0.0–1.2)
Total Protein: 6.3 g/dL — ABNORMAL LOW (ref 6.5–8.1)

## 2024-01-15 LAB — TOTAL PROTEIN, URINE DIPSTICK: Protein, ur: NEGATIVE mg/dL

## 2024-01-15 MED ORDER — SODIUM CHLORIDE 0.9% FLUSH: 10.0000 mL | INTRAVENOUS | Status: DC | PRN

## 2024-01-15 MED ORDER — SODIUM CHLORIDE 0.9 % IV SOLN
7.5000 mg/kg | Freq: Once | INTRAVENOUS | Status: AC
  Administered 2024-01-15: 700 mg via INTRAVENOUS
  Filled 2024-01-15: qty 12

## 2024-01-15 MED ORDER — SODIUM CHLORIDE 0.9 % IV SOLN: INTRAVENOUS | Status: DC

## 2024-01-15 NOTE — Patient Instructions (Signed)
 [facility] - A DEPT OF [facility]  Discharge Instructions: Thank you for choosing [facility] to provide your oncology and hematology care.   If you have a lab appointment with the Cancer Center, please go directly to the Cancer Center and check in at the registration area.   Wear comfortable clothing and clothing appropriate for easy access to any Portacath or PICC line.   We strive to give you quality time with your provider. You may need to reschedule your appointment if you arrive late (15 or more minutes).  Arriving late affects you and other patients whose appointments are after yours.  Also, if you miss three or more appointments without notifying the office, you may be dismissed from the clinic at the provider's discretion.      For prescription refill requests, have your pharmacy contact our office and allow 72 hours for refills to be completed.    Today you received the following chemotherapy and/or immunotherapy agents: [id number]      To help prevent nausea and vomiting after your treatment, we encourage you to take your nausea medication as directed.  BELOW ARE SYMPTOMS THAT SHOULD BE REPORTED IMMEDIATELY: *FEVER GREATER THAN 100.4 F (38 C) OR HIGHER *CHILLS OR SWEATING *NAUSEA AND VOMITING THAT IS NOT CONTROLLED WITH YOUR NAUSEA MEDICATION *UNUSUAL SHORTNESS OF BREATH *UNUSUAL BRUISING OR BLEEDING *URINARY PROBLEMS (pain or burning when urinating, or frequent urination) *BOWEL PROBLEMS (unusual diarrhea, constipation, pain near the anus) TENDERNESS IN MOUTH AND THROAT WITH OR WITHOUT PRESENCE OF ULCERS (sore throat, sores in mouth, or a toothache) UNUSUAL RASH, SWELLING OR PAIN  UNUSUAL VAGINAL DISCHARGE OR ITCHING   Items with * indicate a potential emergency and should be followed up as soon as possible or go to the Emergency Department if any problems should occur.  Please show the CHEMOTHERAPY ALERT CARD or  IMMUNOTHERAPY ALERT CARD at check-in to the Emergency Department and triage nurse.  Should you have questions after your visit or need to cancel or reschedule your appointment, please contact [facility] - A DEPT OF [facility]  Dept: [phone]  and follow the prompts.  Office hours are 8:00 a.m. to 4:30 p.m. Monday - Friday. Please note that voicemails left after 4:00 p.m. may not be returned until the following business day.  We are closed weekends and major holidays. You have access to a nurse at all times for urgent questions. Please call the main number to the clinic Dept: [phone] and follow the prompts.   For any non-urgent questions, you may also contact your provider using MyChart. We now offer e-Visits for anyone 12 and older to request care online for non-urgent symptoms. For details visit [email or web address].   Also download the MyChart app! Go to the app store, search "MyChart", open the app, select [place], and log in with your MyChart username and password.

## 2024-01-22 ENCOUNTER — Encounter: Payer: Self-pay | Admitting: Nurse Practitioner

## 2024-01-22 ENCOUNTER — Encounter: Payer: Self-pay | Admitting: Hematology

## 2024-01-24 ENCOUNTER — Other Ambulatory Visit: Payer: Self-pay

## 2024-01-28 ENCOUNTER — Other Ambulatory Visit: Payer: Self-pay | Admitting: Hematology

## 2024-01-29 ENCOUNTER — Encounter: Payer: Self-pay | Admitting: Hematology

## 2024-01-31 ENCOUNTER — Other Ambulatory Visit: Payer: Self-pay | Admitting: Family Medicine

## 2024-01-31 DIAGNOSIS — E1169 Type 2 diabetes mellitus with other specified complication: Secondary | ICD-10-CM

## 2024-02-04 NOTE — Assessment & Plan Note (Signed)
 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 Path showed overall Stage IIIB cancer.  -s/p 6 months adjuvant FOLFOX, completed 06/01/20, oxaliplatin  held for final 2 cycles due to neuropathy.  -s/p BSO and hysterectomy on 12/22/21 with Dr. Viktoria. Path revealed metastatic moderately differentiated colonic adenocarcinoma involving both ovaries and peritoneum -she restarted FOLFOX on 01/24/22. -Due to disease progression, chemotherapy changed to second line FOLFIRI and bevacizumab  on April 06, 2022. She has been tolerating moderately well, better now with dose reduction, will continue -Restaging CT scan from 09/27/2022 showed continous response in liver and peritoneal metastasis, no other new lesions.   -We discussed maintenance therapy option in future  -due to her PE in 10/2022, will hold beva for 3 months  -restaging CT 12/31/2022 showed stable disease and live met is not well defined on CT. She restarted beva on 01/17/23  -restaging CT 06/19/2023 showed overall stable disease (stable small lung nodules, no other evidence of cancer) Restaging CT 09/12/2023 showed no residual radiographic evidence of disease.   - We discussed option of maintenance therapy, treatment changed to capecitabine  and bevacizumab  on 10/02/2023 -CT 12/18/23 showed stable disease

## 2024-02-05 ENCOUNTER — Inpatient Hospital Stay: Attending: Hematology

## 2024-02-05 ENCOUNTER — Inpatient Hospital Stay (HOSPITAL_BASED_OUTPATIENT_CLINIC_OR_DEPARTMENT_OTHER): Admitting: Hematology

## 2024-02-05 ENCOUNTER — Other Ambulatory Visit: Payer: Self-pay

## 2024-02-05 ENCOUNTER — Telehealth: Payer: Self-pay | Admitting: Hematology

## 2024-02-05 ENCOUNTER — Encounter: Payer: Self-pay | Admitting: Hematology

## 2024-02-05 ENCOUNTER — Telehealth: Payer: Self-pay

## 2024-02-05 ENCOUNTER — Inpatient Hospital Stay

## 2024-02-05 VITALS — BP 150/70 | HR 67 | Temp 98.1°F | Resp 19 | Wt 210.7 lb

## 2024-02-05 DIAGNOSIS — S91351A Open bite, right foot, initial encounter: Secondary | ICD-10-CM | POA: Insufficient documentation

## 2024-02-05 DIAGNOSIS — M79602 Pain in left arm: Secondary | ICD-10-CM | POA: Diagnosis not present

## 2024-02-05 DIAGNOSIS — R5383 Other fatigue: Secondary | ICD-10-CM | POA: Diagnosis not present

## 2024-02-05 DIAGNOSIS — Z8049 Family history of malignant neoplasm of other genital organs: Secondary | ICD-10-CM | POA: Diagnosis not present

## 2024-02-05 DIAGNOSIS — Z853 Personal history of malignant neoplasm of breast: Secondary | ICD-10-CM | POA: Insufficient documentation

## 2024-02-05 DIAGNOSIS — C182 Malignant neoplasm of ascending colon: Secondary | ICD-10-CM | POA: Diagnosis not present

## 2024-02-05 DIAGNOSIS — C787 Secondary malignant neoplasm of liver and intrahepatic bile duct: Secondary | ICD-10-CM | POA: Diagnosis not present

## 2024-02-05 DIAGNOSIS — L853 Xerosis cutis: Secondary | ICD-10-CM | POA: Insufficient documentation

## 2024-02-05 DIAGNOSIS — W540XXA Bitten by dog, initial encounter: Secondary | ICD-10-CM | POA: Insufficient documentation

## 2024-02-05 DIAGNOSIS — C7963 Secondary malignant neoplasm of bilateral ovaries: Secondary | ICD-10-CM | POA: Insufficient documentation

## 2024-02-05 DIAGNOSIS — Z803 Family history of malignant neoplasm of breast: Secondary | ICD-10-CM | POA: Insufficient documentation

## 2024-02-05 DIAGNOSIS — Z5111 Encounter for antineoplastic chemotherapy: Secondary | ICD-10-CM | POA: Diagnosis not present

## 2024-02-05 DIAGNOSIS — C786 Secondary malignant neoplasm of retroperitoneum and peritoneum: Secondary | ICD-10-CM | POA: Diagnosis not present

## 2024-02-05 DIAGNOSIS — M25512 Pain in left shoulder: Secondary | ICD-10-CM | POA: Diagnosis not present

## 2024-02-05 DIAGNOSIS — T451X5A Adverse effect of antineoplastic and immunosuppressive drugs, initial encounter: Secondary | ICD-10-CM | POA: Diagnosis not present

## 2024-02-05 DIAGNOSIS — Z7901 Long term (current) use of anticoagulants: Secondary | ICD-10-CM | POA: Diagnosis not present

## 2024-02-05 DIAGNOSIS — Z800 Family history of malignant neoplasm of digestive organs: Secondary | ICD-10-CM | POA: Diagnosis not present

## 2024-02-05 LAB — COMPREHENSIVE METABOLIC PANEL WITH GFR
ALT: 17 U/L (ref 0–44)
AST: 24 U/L (ref 15–41)
Albumin: 4 g/dL (ref 3.5–5.0)
Alkaline Phosphatase: 77 U/L (ref 38–126)
Anion gap: 6 (ref 5–15)
BUN: 25 mg/dL — ABNORMAL HIGH (ref 8–23)
CO2: 27 mmol/L (ref 22–32)
Calcium: 10.3 mg/dL (ref 8.9–10.3)
Chloride: 108 mmol/L (ref 98–111)
Creatinine, Ser: 0.84 mg/dL (ref 0.44–1.00)
GFR, Estimated: >60 mL/min (ref >60)
Glucose, Bld: 150 mg/dL — ABNORMAL HIGH (ref 70–99)
Potassium: 4.5 mmol/L (ref 3.5–5.1)
Sodium: 141 mmol/L (ref 135–145)
Total Bilirubin: 2.8 mg/dL — ABNORMAL HIGH (ref 0.0–1.2)
Total Protein: 6.6 g/dL (ref 6.5–8.1)

## 2024-02-05 LAB — CBC WITH DIFFERENTIAL (CANCER CENTER ONLY)
Abs Immature Granulocytes: 0 K/uL (ref 0.00–0.07)
Basophils Absolute: 0 K/uL (ref 0.0–0.1)
Basophils Relative: 0 %
Eosinophils Absolute: 0.2 K/uL (ref 0.0–0.5)
Eosinophils Relative: 4 %
HCT: 33.7 % — ABNORMAL LOW (ref 36.0–46.0)
Hemoglobin: 11.6 g/dL — ABNORMAL LOW (ref 12.0–15.0)
Immature Granulocytes: 0 %
Lymphocytes Relative: 38 %
Lymphs Abs: 1.8 K/uL (ref 0.7–4.0)
MCH: 38.2 pg — ABNORMAL HIGH (ref 26.0–34.0)
MCHC: 34.4 g/dL (ref 30.0–36.0)
MCV: 110.9 fL — ABNORMAL HIGH (ref 80.0–100.0)
Monocytes Absolute: 0.3 K/uL (ref 0.1–1.0)
Monocytes Relative: 7 %
Neutro Abs: 2.3 K/uL (ref 1.7–7.7)
Neutrophils Relative %: 51 %
Platelet Count: 156 K/uL (ref 150–400)
RBC: 3.04 MIL/uL — ABNORMAL LOW (ref 3.87–5.11)
RDW: 15.4 % (ref 11.5–15.5)
WBC Count: 4.6 K/uL (ref 4.0–10.5)
nRBC: 0 % (ref 0.0–0.2)

## 2024-02-05 LAB — TOTAL PROTEIN, URINE DIPSTICK: Protein, ur: NEGATIVE mg/dL

## 2024-02-05 MED ORDER — SODIUM CHLORIDE 0.9 % IV SOLN: INTRAVENOUS | Status: DC

## 2024-02-05 MED ORDER — SODIUM CHLORIDE 0.9 % IV SOLN
7.5000 mg/kg | Freq: Once | INTRAVENOUS | Status: AC
  Administered 2024-02-05: 700 mg via INTRAVENOUS
  Filled 2024-02-05: qty 12

## 2024-02-05 MED ORDER — CAPECITABINE 500 MG PO TABS
ORAL_TABLET | ORAL | 1 refills | Status: DC
  Filled 2024-02-05: qty 98, fill #0
  Filled 2024-02-16: qty 98, 28d supply, fill #0

## 2024-02-05 NOTE — Progress Notes (Signed)
 [facility]   [phone] [phone]   Clinic Follow up Note   Patient Care Team: [name], MD as PCP - General (Family Medicine) [name], MD as Consulting Physician (General Surgery) [name], MD as Consulting Physician (Hematology) [name], MD as Consulting Physician (General Surgery) [name], MD as Consulting Physician (Hematology and Oncology) [facility], [facility]   Date of Service:  02/05/2024  CHIEF COMPLAINT: f/u of metastatic colon cancer  CURRENT THERAPY:  Maintenance capecitabine  and bevacizumab   Oncology History   Malignant neoplasm of ascending colon (HCC) pT3N2aM0 stage IIB, MSS, metastatic to b/l ovaries and peritoneum and liver, KRAS mutation G12V (+)  -Initially diagnosed in 08/2019, s/p resection with Dr Debby on 11/01/19. Path showed overall Stage IIIB cancer.  -s/p 6 months adjuvant FOLFOX, completed 06/01/20, oxaliplatin  held for final 2 cycles due to neuropathy.  -s/p BSO and hysterectomy on 12/22/21 with Dr. Viktoria. Path revealed metastatic moderately differentiated colonic adenocarcinoma involving both ovaries and peritoneum -she restarted FOLFOX on 01/24/22. -Due to disease progression, chemotherapy changed to second line FOLFIRI and bevacizumab  on April 06, 2022. She has been tolerating moderately well, better now with dose reduction, will continue -Restaging CT scan from 09/27/2022 showed continous response in liver and peritoneal metastasis, no other new lesions.   -We discussed maintenance therapy option in future  -due to her PE in 10/2022, will hold beva for 3 months  -restaging CT 12/31/2022 showed stable disease and live met is not well defined on CT. She restarted beva on 01/17/23  -restaging CT 06/19/2023 showed overall stable disease (stable small lung nodules, no other evidence of cancer) Restaging CT 09/12/2023 showed no residual radiographic evidence of disease.   -  We discussed option of maintenance therapy, treatment changed to capecitabine  and bevacizumab  on 10/02/2023 -CT 12/18/23 showed stable disease  Assessment & Plan Metastatic colon cancer Metastatic colon cancer with well-managed tumor markers, though slightly increasing over the past three months. Currently on oral chemotherapy, which is well-tolerated with minimal side effects. No significant gastrointestinal issues reported. Hair regrowth noted after previous loss due to chemotherapy. - Continue oral chemotherapy regimen. - Order CT scan around November 10 to monitor disease progression. - Follow-up appointment on November 27.  Left shoulder and arm pain Intermittent left shoulder and arm pain, likely related to shoulder issues rather than thrombosis. Pain is more pronounced at night and affects sleep. No significant swelling or tenderness noted. Prefers to manage conservatively without injections or further interventions at this time. - Recommend taking ibuprofen at night to alleviate pain. - Consider ultrasound if symptoms worsen to rule out thrombosis.  Dry skin with inflammation Dry skin with some inflammation, not as severe as previously. Fingernails are splintering, and fingertips experience burning sensation. Currently using Jurgens lotion, plans to switch back to O'Keeffe's. - Recommend using Otherly Smooth cream with urea for skin hydration. - Continue using Bydureon for arthritis and skin benefits.  Hyperbilirubinemia Her bilirubin is 2.8, increased from previous 1.7-1.9, she is asymptomatic with normal ALT and AST, likely related to capecitabine , will hold it and repeat her labs next week   Plan - Patient is clinically doing well, labs reviewed, adequate for treatment, will proceed bevacizumab  today and continue every 3 weeks - Her bilirubin has increased from 1.7-2.8, will hold capecitabine  for now, and repeat lab next week.  If bilirubin drops below 2.0, will restart capecitabine .   Otherwise we will obtain CT scan or ultrasound sooner. - Follow-up  in 3 weeks, or sooner if needed.    SUMMARY OF ONCOLOGIC HISTORY: Oncology History Overview Note   Cancer Staging  Malignant neoplasm of female breast [facility]) Staging form: Breast, AJCC 7th Edition - Clinical stage from 06/11/2014: Stage 0 (Tis (DCIS), N0, M0) - Unsigned Staged by: Pathologist and managing physician Laterality: Left Estrogen receptor status: Positive Progesterone receptor status: Positive Stage used in treatment planning: Yes National guidelines used in treatment planning: Yes Type of national guideline used in treatment planning: NCCN - Pathologic stage from 07/03/2014: Stage Unknown (Tis (DCIS), NX, cM0) - Signed by Guinevere Chew, MD on 07/10/2014 Staged by: Pathologist Laterality: Left Estrogen receptor status: Positive Progesterone receptor status: Positive Stage used in treatment planning: Yes National guidelines used in treatment planning: Yes Type of national guideline used in treatment planning: NCCN Staging comments: Staged on final lumpectomy specimen by Dr. Loral.  Right colon cancer Staging form: Colon and Rectum, AJCC 8th Edition - Pathologic stage from 11/01/2019: Stage IIIB (pT3, pN2a, cM0) - Signed by [name], MD on 11/29/2019 Stage prefix: Initial diagnosis Histologic grading system: 4 grade system Histologic grade (G): G2 Residual tumor (R): R0 - None Tumor deposits (TD): Absent Perineural invasion (PNI): Absent Microsatellite instability (MSI): Stable KRAS mutation: Unknown NRAS mutation: Unknown BRAF mutation: Unknown     Malignant neoplasm of female breast (HCC)  05/29/2014 Initial Biopsy   Left breast needle core biopsy: Grade 2, DCIS with calcs. ER+ (100%), PR+ (96%).    06/04/2014 Initial Diagnosis   Left breast DCIS with calcifications, ER 100%, PR 96%   06/10/2014 Breast MRI   Left breast: 2.4 x 1.3 x 1.1 cm area of patchy non-mass enhancement upper outer quadrant  includes postbiopsy seroma; Right breast: 1.2 cm previously biopsied stable benign fibroadenoma   06/12/2014 Procedure   Genetic counseling/testing: Identified 1 VUS on CHEK2 gene. Remainder of 17 gene panel tested negative and included: ATM, BARD1, BRCA 1/2, BRIP1, CDH1, CHEK2, EPCAM, MLH1, MSH2, MSH6, NBN, NF1, PALB2, PTEN, RAD50, RAD51C, RAD51D, STK11, and TP53.    07/01/2014 Surgery   Left breast lumpectomy ([facility]): Grade 1, DCIS, spanning 2.3 cm, 1 mm margin, ER 100%, PR 96%   07/31/2014 - 08/28/2014 Radiation Therapy   Adjuvant RT completed Signe). Left breast: Total dose 42.5 Gy over 17 fractions. Left breast boost: Total dose 7.5 Gy over 3 fractions.    09/14/2014 - 01/13/2020 Anti-estrogen oral therapy   Anastrazole 1mg  daily. Planned duration of treatment: 5 years [place]). Completed in 01/2020.    09/25/2014 Survivorship   Survivorship Care Plan given to patient and reviewed with her in person.    03/02/2021 Imaging   CT CAP  IMPRESSION: 1. No findings of active/recurrent malignancy. Partial right hemicolectomy. 2. Endometrial stripe remains mildly thickened, but endometrial biopsy in May was negative for malignancy. 3. Progressive endplate sclerosis and endplate irregularity at T2-3, probably due to degenerative endplate findings. If the has referable upper thoracic pain/symptoms then thoracic spine MRI could be used for further workup. 4. Other imaging findings of potential clinical significance: Mild cardiomegaly. Aortic Atherosclerosis (ICD10-I70.0). Mild mitral valve calcification. Postoperative findings in the left breast with adjacent radiation port anteriorly in the left upper lobe. Tiny pulmonary nodules in the left lower lobe are unchanged from earliest available comparison of 10/17/2019 and probably benign although may merit surveillance. Multilevel lumbar impingement. Mild pelvic floor laxity.   Malignant neoplasm of ascending colon (HCC)  09/19/2019 Imaging    CT AP W contrast 09/19/19  IMPRESSION Fullness in  the cecum, cannot exclude a mass. No evidence for metastatic disease is identified.    09/24/2019 Procedure   Colonoscopy by Dr Ernesto 09/24/19 IMPRESSION 1. The colon was redundant  2. Mild diverticulosis was noted through the entire examined colon 3. Single 12mm polyp was found in the ascending colon; polypectomy was performed using snare cautery and biopsy forceps 4. Mild diverticulosis was notes in the descending colon and sigmoid colon.  5. Single polyp was found in the sigmoid colon, polypectomy was performed with cold forceps.  6. Single polyp was found in the rectosigmoid colon; polypectomy was performed with cold snare  7. Small internal hemorrhoids  8. Large mass was found at the cecum; multiple biopsies of the area were performed using cold forceps; injection (tattooing) was performed distal to the mass.    09/24/2019 Initial Biopsy   INTERPRETATION AND DIAGNOSIS:  A. Cecum, biopsy:  Invasive moderately differentiated adenocarcinoma.  see comment  B. Polyp @ ascending colon, polypectomy:  Tubular Adenoma  C. Polyp @ sigmoid colon Polypectomy:  hyperplastic polyp.  D. Polyp @ rectosigmoid colon, Polypectomy:  Hyperplastic Polyp      10/16/2019 Imaging   CT Chest IMPRESSION: 1. Multiple small pulmonary nodules measuring 5 mm or less in size in the lungs. These are nonspecific and are typically considered statistically likely benign. However, given the patient's history of primary malignancy, close attention on follow-up studies is recommended to ensure stability. 2. Aortic atherosclerosis, in addition to right coronary artery disease. Assessment for potential risk factor modification, dietary therapy or pharmacologic therapy may be warranted, if clinically indicated. 3. There are calcifications of the aortic valve and mitral annulus. Echocardiographic correlation for evaluation of potential valvular dysfunction may be  warranted if clinically indicated. 4. Small hiatal hernia.   Aortic Atherosclerosis (ICD10-I70.0).   11/01/2019 Initial Diagnosis   Colon cancer (HCC)   11/01/2019 Surgery   LAPAROSCOPIC PARTIAL COLECTOMY by Dr Debby and Dr Sheldon   11/01/2019 Pathology Results   FINAL MICROSCOPIC DIAGNOSIS:   A. COLON, PROXIMAL RIGHT, COLECTOMY:  - Invasive colonic adenocarcinoma, 5 cm.  - Tumor invades through the muscularis propria into pericolonic tissues.   - Margins of resection are not involved.  - Metastatic carcinoma in (5) of (13) lymph nodes.  - See oncology table.    MSI Stable  Mismatch repair normal  MLH1 - Preserved nuclear expression (greater 50% tumor expression) MSH2 - Preserved nuclear expression (greater 50% tumor expression) MSH6 - Preserved nuclear expression (greater 50% tumor expression) PMS2 - Preserved nuclear expression (greater 50% tumor expression)   11/01/2019 Cancer Staging   Staging form: Colon and Rectum, AJCC 8th Edition - Pathologic stage from 11/01/2019: Stage IIIB (pT3, pN2a, cM0) - Signed by [name], MD on 11/29/2019   12/10/2019 Procedure   PAC placed 12/10/19   12/17/2019 - 06/01/2020 Chemotherapy   FOLFOX q2weeks starting in 2 weeks starting 12/17/19. Held 01/27/20-02/10/20 due to b/l PE. Oxaliplatin  held C11-12 due to neuropathy. Completed on 06/01/20   03/02/2021 Imaging   CT CAP  IMPRESSION: 1. No findings of active/recurrent malignancy. Partial right hemicolectomy. 2. Endometrial stripe remains mildly thickened, but endometrial biopsy in May was negative for malignancy. 3. Progressive endplate sclerosis and endplate irregularity at T2-3, probably due to degenerative endplate findings. If the has referable upper thoracic pain/symptoms then thoracic spine MRI could be used for further workup. 4. Other imaging findings of potential clinical significance: Mild cardiomegaly. Aortic Atherosclerosis (ICD10-I70.0). Mild mitral valve calcification.  Postoperative findings in the left breast  with adjacent radiation port anteriorly in the left upper lobe. Tiny pulmonary nodules in the left lower lobe are unchanged from earliest available comparison of 10/17/2019 and probably benign although may merit surveillance. Multilevel lumbar impingement. Mild pelvic floor laxity.   08/26/2021 Imaging   EXAM: CT CHEST, ABDOMEN, AND PELVIS WITH CONTRAST  IMPRESSION: 1. Stable examination without new or progressive findings to suggest local recurrence or metastatic disease within the chest, abdomen, or pelvis. 2. Hepatomegaly with hepatic steatosis. 3. Sigmoid colonic diverticulosis without findings of acute diverticulitis. 4. Similar prominent endplate sclerosis and irregularity at T2-T3 is most consistent with Modic type endplate degenerative changes. However, if patient has referable upper thoracic pain consider further workup with thoracic spine MRI. 5. Similar thickening of the endometrial stripe measuring up to 8 mm, which was previously biopsied with results negative for malignancy. 6. Aortic Atherosclerosis (ICD10-I70.0).   11/10/2021 PET scan   IMPRESSION: 1. LEFT ovary is increased in size and is intensely hypermetabolic. While physiologic hypermetabolic ovarian tissue is not uncommon, the enlargement and asymmetric activity warrants further evaluation. Consider contrast pelvic MRI vs tissue sampling. 2. No evidence of metastatic colorectal carcinoma otherwise. 3. Post RIGHT hemicolectomy anatomy. 4. Evidence of radiation change in the LEFT upper lobe (remote breast cancer).     12/22/2021 Relapse/Recurrence    FINAL MICROSCOPIC DIAGNOSIS:   A. LEFT OVARY AND FALLOPIAN TUBE, SALPINGO OOPHORECTOMY:  - Metastatic moderately differentiated colonic adenocarcinoma involving left ovary  - Focal ovarian stromal calcification  - Segment of benign fallopian tube   B. UTERUS WITH RIGHT FALLOPIAN TUBE AND OVARY, HYSTERECTOMY AND  SALPINGO-OOPHORECTOMY:  - Metastatic moderately differentiated colonic adenocarcinoma involving right ovary  - Focal invasive extensive adenomyosis  - Benign endometrial polyps  - Benign proliferative phase endometrium  - Hydrosalpinx of right fallopian tube  - Focal ovarian stromal calcification   C. PERITONEAL DEPOSITS, ANTERIOR CUL DE SAC, BIOPSY:  - Metastatic mucinous adenocarcinoma, consistent with colorectal primary    COMMENT:  Immunohistochemical stains show that the tumor cells are positive for CK20 and CDX2 while they are negative for CK7 and PAX8, consistent with above interpretation.    01/24/2022 - 03/23/2022 Chemotherapy   Patient is on Treatment Plan : COLORECTAL FOLFOX q14d x 3 months     04/01/2022 Imaging   CT AP IMPRESSION: 1. New omental metastatic disease. 2. Vague hypoattenuating lesion in the inferior right hepatic lobe is new and also worrisome for metastatic disease. 3. Similar small right lower lobe nodules. Recommend continued attention on follow-up. 4. Steatotic enlarged liver. 5.  Aortic atherosclerosis (ICD10-I70.0).   04/06/2022 - 09/18/2023 Chemotherapy   Patient is on Treatment Plan : COLORECTAL FOLFIRI + Bevacizumab  q14d     04/21/2022 Imaging    IMPRESSION: 1. Stable chest CT. No evidence of metastatic disease. 2. Stable small pulmonary nodules, considered benign based on stability. 3. Stable postsurgical changes in the left breast and anterior left upper lobe radiation changes. 4. Aortic Atherosclerosis (ICD10-I70.0) and Emphysema (ICD10-J43.9).   04/21/2022 Imaging    IMPRESSION: 1. Small enhancing lesion inferiorly in the right hepatic lobe is typical of metastatic disease and stable from recent abdominal CT. No other definite liver lesions are identified. Mild hepatic steatosis. 2. No other significant abdominal findings. 3. Omental nodularity seen on CT is not well visualized by MRI.    Imaging     06/23/2022 Imaging     IMPRESSION: 1. Hypodense lesion of the inferior right lobe of the liver, hepatic segment VI is diminished in  size, consistent with treatment response. 2. Tiny peritoneal and omental nodules identified by prior examination are diminished in size, consistent with treatment response. 3. Multiple tiny pulmonary nodules unchanged, most likely benign and incidental, however continued attention on follow-up warranted in the setting of known metastatic disease. 4. No evidence of new metastatic disease in the chest, abdomen, or pelvis. 5. Status post partial right hemicolectomy and reanastomosis. 6. Diffuse mosaic attenuation of the airspaces, consistent with small airways disease. 7. Hepatomegaly.   10/02/2023 -  Chemotherapy   Patient is on Treatment Plan : COLORECTAL Bevacizumab  q21d     12/18/2023 Imaging   CT CAP with contrast IMPRESSION: 1. Prior partial right hemicolectomy with ileocolic anastomosis. No suspicious nodularity along the suture line. Prominent adjacent lymph nodes are similar to prior. 2. Stable tiny scattered pulmonary nodules. 3. No evidence of new or progressive disease in the chest, abdomen or pelvis. 4. Hepatomegaly with hepatic steatosis. 5. Aortic atherosclerosis.        Discussed the use of AI scribe software for clinical note transcription with the patient, who gave verbal consent to proceed.  History of Present Illness Kim Castro is a 78 year old female with metastatic colon cancer who presents for follow-up.  She experiences intermittent pain in her left arm radiating to her elbow and sometimes down to her fingers, more pronounced at night and relieved by adjusting her arm position. Her left hand usually swells, and the pain worsens with arm lifting or reaching across her body.  She is on oral chemotherapy, which she tolerates well, with hair loss as the only significant side effect, now regrowing. Her tumor marker has been stable but gradually  increasing over the past three months. She takes ibuprofen in the morning for back pain but not at night.     All other systems were reviewed with the patient and are negative.  MEDICAL HISTORY:  Past Medical History:  Diagnosis Date   Aortic atherosclerosis    Arthritis    feet, lower back   Basal cell carcinoma    arm   BRCA gene mutation negative    Breast cancer of upper-outer quadrant of left female breast (HCC) 06/04/2014   Cataract    immature on the left   Colon cancer (HCC) 08/2019   Diabetes mellitus without complication (HCC)    Diverticulosis    Dizziness    > 49yrs ago;took Antivert    Family history of anesthesia complication    sister slow to wake up with anesthesia   Family history of breast cancer    Family history of colon cancer    Family history of uterine cancer    GERD (gastroesophageal reflux disease)    takes occasional TUMs   History of bronchitis    > 61yrs ago   History of colon polyps    History of hiatal hernia    Small noted on CT   History of pulmonary embolus (PE)    Hypertension    takes Losartan  daily and HCTZ   Iron deficiency anemia    Joint pain    Metastatic disease (HCC) 2023   peritoneum and liver mets   Numbness    to toes on each foot   Peripheral neuropathy    feet and toes   Personal history of radiation therapy    Pulmonary nodules    Noted on CT   Radiation 07/31/14-08/28/14   Left Breast 20 fxs   Seasonal allergies    takes Claritin prn  Urinary frequency    Vitamin D deficiency    takes VIt D daily    SURGICAL HISTORY: Past Surgical History:  Procedure Laterality Date   BREAST BIOPSY Bilateral    BREAST LUMPECTOMY Left    BREAST LUMPECTOMY WITH RADIOACTIVE SEED LOCALIZATION Left 07/01/2014   Procedure: LEFT BREAST LUMPECTOMY WITH RADIOACTIVE SEED LOCALIZATION;  Surgeon: Morene Olives, MD;  Location: [facility];  Service: General;  Laterality: Left;   CATARACT EXTRACTION Right     COLONOSCOPY  09/24/2019   [place]   LAPAROSCOPIC PARTIAL COLECTOMY N/A 11/01/2019   Procedure: LAPAROSCOPIC PARTIAL COLECTOMY;  Surgeon: [name], MD;  Location: [facility];  Service: General;  Laterality: N/A;   POLYPECTOMY     PORTACATH PLACEMENT N/A 12/10/2019   Procedure: INSERTION PORT-A-CATH ULTRASOUND GUIDED IN RIGHT IJ;  Surgeon: [name], MD;  Location: [facility];  Service: General;  Laterality: N/A;   ROBOTIC ASSISTED TOTAL HYSTERECTOMY Bilateral 12/22/2021   Procedure: XI ROBOTIC ASSISTED TOTAL HYSTERECTOMY WITH BILATERAL SALPINGO OOPHORECTOMY;  Surgeon: Viktoria Comer SAUNDERS, MD;  Location: [facility];  Service: Gynecology;  Laterality: Bilateral;   TOTAL KNEE ARTHROPLASTY Left 10/24/2012   Procedure: TOTAL KNEE ARTHROPLASTY;  Surgeon: Dempsey JINNY Sensor, MD;  Location: [facility];  Service: Orthopedics;  Laterality: Left;   TOTAL KNEE ARTHROPLASTY Right 01/09/2013   Procedure: TOTAL KNEE ARTHROPLASTY;  Surgeon: Dempsey JINNY Sensor, MD;  Location: [facility];  Service: Orthopedics;  Laterality: Right;   TUBAL LIGATION      I have reviewed the social history and family history with the patient and they are unchanged from previous note.  ALLERGIES:  is allergic to oxycodone .  MEDICATIONS:  Current Outpatient Medications  Medication Sig Dispense Refill   b complex vitamins capsule Take 1 capsule by mouth daily.     Cholecalciferol (VITAMIN D) 2000 UNITS CAPS Take 2,000 Units by mouth daily.      ELIQUIS  5 MG TABS tablet Take 1 tablet by mouth twice daily 60 tablet 0   famotidine (PEPCID) 20 MG tablet Take 20 mg by mouth daily.     ibuprofen (ADVIL) 200 MG tablet Take 200 mg by mouth daily as needed (pain).     lidocaine -prilocaine  (EMLA ) cream Apply 1 Application topically as needed. 30 g 1   loratadine (CLARITIN) 10 MG tablet Take 10 mg by mouth daily as needed for allergies.     losartan  (COZAAR ) 25 MG tablet Take 1 tablet by mouth once daily 90 tablet 2   magic mouthwash (nystatin , lidocaine ,  diphenhydrAMINE , alum & mag hydroxide) suspension Swish and swallow 4 times daily as needed for mouth pain. 140 mL 1   metFORMIN  (GLUCOPHAGE ) 500 MG tablet TAKE 1 TABLET BY MOUTH IN THE MORNING AND AT BEDTIME 180 tablet 3   Multiple Vitamins-Minerals (MULTIVITAMIN WITH MINERALS) tablet Take 1 tablet by mouth daily.     ondansetron  (ZOFRAN ) 8 MG tablet Take 1 tablet (8 mg total) by mouth every 8 (eight) hours as needed for nausea or vomiting. Start on the third day after chemotherapy. 30 tablet 1   potassium chloride  (KLOR-CON ) 10 MEQ tablet Take 1 tablet by mouth twice daily 60 tablet 0   pravastatin  (PRAVACHOL ) 10 MG tablet Take 1 tablet by mouth once daily 90 tablet 3   pregabalin  (LYRICA ) 25 MG capsule TAKE 1 CAPSULE BY MOUTH ONCE DAILY IN ADDITION TO 50 MG  AT BEDTIME 30 capsule 1   pregabalin  (LYRICA ) 50 MG capsule TAKE 1 CAPSULE(50MG ) BY MOUTH TWICE DAILY  IN ADDITION TO THE 25 MG CAPSULE AT BEDTIME FOR A TOTAL OF 75MG  AT BEDTIME. 60 capsule 0   prochlorperazine  (COMPAZINE ) 10 MG tablet Take 1 tablet (10 mg total) by mouth every 6 (six) hours as needed for nausea or vomiting. 30 tablet 1   traMADol  (ULTRAM ) 50 MG tablet Take 1 tablet (50 mg total) by mouth every 6 (six) hours as needed. 30 tablet 0   triamcinolone  ointment (KENALOG ) 0.5 % Apply 1 Application topically 2 (two) times daily. 30 g 0   capecitabine  (XELODA ) 500 MG tablet Take 3 tabs in morning and 4 tabs in evening. Take within 30 minutes after meals. Take for 7 days on and 7 days off, then repeat. 98 tablet 1   No current facility-administered medications for this visit.    PHYSICAL EXAMINATION: ECOG PERFORMANCE STATUS: 1 - Symptomatic but completely ambulatory  Vitals:   02/05/24 0807 02/05/24 0810  BP: (!) 160/68 (!) 150/70  Pulse:  67  Resp:  19  Temp:  98.1 F (36.7 C)  SpO2:  97%   Wt Readings from Last 3 Encounters:  02/05/24 210 lb 11.2 oz (95.6 kg)  01/15/24 209 lb 8 oz (95 kg)  12/25/23 210 lb 4.8 oz  (95.4 kg)     GENERAL:alert, no distress and comfortable SKIN: skin color, texture, turgor are normal, no rashes or significant lesions EYES: normal, Conjunctiva are pink and non-injected, sclera clear NECK: supple, thyroid normal size, non-tender, without nodularity LYMPH:  no palpable lymphadenopathy in the cervical, axillary  LUNGS: clear to auscultation and percussion with normal breathing effort HEART: regular rate & rhythm and no murmurs and no lower extremity edema ABDOMEN:abdomen soft, non-tender and normal bowel sounds Musculoskeletal:no cyanosis of digits and no clubbing  NEURO: alert & oriented x 3 with fluent speech, no focal motor/sensory deficits  Physical Exam EXTREMITIES: No swelling in left arm. MUSCULOSKELETAL: Left shoulder pain on movement.  LABORATORY DATA:  I have reviewed the data as listed    Latest Ref Rng & Units 02/05/2024    7:42 AM 01/15/2024    7:35 AM 12/25/2023    7:32 AM  CBC  WBC 4.0 - 10.5 K/uL 4.6  5.9  7.4   Hemoglobin 12.0 - 15.0 g/dL 88.3  88.5  88.9   Hematocrit 36.0 - 46.0 % 33.7  33.4  32.5   Platelets 150 - 400 K/uL 156  130  189         Latest Ref Rng & Units 02/05/2024    7:42 AM 01/15/2024    7:37 AM 12/25/2023    7:32 AM  CMP  Glucose 70 - 99 mg/dL 849  895  892   BUN 8 - 23 mg/dL 25  21  18    Creatinine 0.44 - 1.00 mg/dL 9.15  9.30  9.36   Sodium 135 - 145 mmol/L 141  143  142   Potassium 3.5 - 5.1 mmol/L 4.5  4.4  4.5   Chloride 98 - 111 mmol/L 108  110  109   CO2 22 - 32 mmol/L 27  27  27    Calcium  8.9 - 10.3 mg/dL 89.6  9.6  9.0   Total Protein 6.5 - 8.1 g/dL 6.6  6.3  6.1   Total Bilirubin 0.0 - 1.2 mg/dL 2.8  1.7  1.9   Alkaline Phos 38 - 126 U/L 77  99  101   AST 15 - 41 U/L 24  30  40   ALT 0 - 44  U/L 17  19  21        RADIOGRAPHIC STUDIES: I have personally reviewed the radiological images as listed and agreed with the findings in the report. No results found.    Orders Placed This Encounter  Procedures    CT CHEST ABDOMEN PELVIS W CONTRAST    Standing Status:   Future    Expected Date:   03/11/2024    Expiration Date:   02/04/2025    If indicated for the ordered procedure, I authorize the administration of contrast media per Radiology protocol:   Yes    Does the patient have a contrast media/X-ray dye allergy?:   No    Preferred imaging location?:   [facility]    If indicated for the ordered procedure, I authorize the administration of oral contrast media per Radiology protocol:   Yes   All questions were answered. The patient knows to call the clinic with any problems, questions or concerns. No barriers to learning was detected. The total time spent in the appointment was 30 minutes, including review of chart and various tests results, discussions about plan of care and coordination of care plan     Onita Mattock, MD 02/05/2024

## 2024-02-05 NOTE — Telephone Encounter (Signed)
 Kim Castro is scheduled and aware of her 10/15 lab appointment.

## 2024-02-05 NOTE — Patient Instructions (Signed)
 [facility] - A DEPT OF [facility]  Discharge Instructions: Thank you for choosing [facility] to provide your oncology and hematology care.   If you have a lab appointment with the Cancer Center, please go directly to the Cancer Center and check in at the registration area.   Wear comfortable clothing and clothing appropriate for easy access to any Portacath or PICC line.   We strive to give you quality time with your provider. You may need to reschedule your appointment if you arrive late (15 or more minutes).  Arriving late affects you and other patients whose appointments are after yours.  Also, if you miss three or more appointments without notifying the office, you may be dismissed from the clinic at the provider's discretion.      For prescription refill requests, have your pharmacy contact our office and allow 72 hours for refills to be completed.    Today you received the following chemotherapy and/or immunotherapy agents: Bevacizumab -awwb (MVASI )    To help prevent nausea and vomiting after your treatment, we encourage you to take your nausea medication as directed.  BELOW ARE SYMPTOMS THAT SHOULD BE REPORTED IMMEDIATELY: *FEVER GREATER THAN 100.4 F (38 C) OR HIGHER *CHILLS OR SWEATING *NAUSEA AND VOMITING THAT IS NOT CONTROLLED WITH YOUR NAUSEA MEDICATION *UNUSUAL SHORTNESS OF BREATH *UNUSUAL BRUISING OR BLEEDING *URINARY PROBLEMS (pain or burning when urinating, or frequent urination) *BOWEL PROBLEMS (unusual diarrhea, constipation, pain near the anus) TENDERNESS IN MOUTH AND THROAT WITH OR WITHOUT PRESENCE OF ULCERS (sore throat, sores in mouth, or a toothache) UNUSUAL RASH, SWELLING OR PAIN  UNUSUAL VAGINAL DISCHARGE OR ITCHING   Items with * indicate a potential emergency and should be followed up as soon as possible or go to the Emergency Department if any problems should occur.  Please show the CHEMOTHERAPY ALERT CARD or  IMMUNOTHERAPY ALERT CARD at check-in to the Emergency Department and triage nurse.  Should you have questions after your visit or need to cancel or reschedule your appointment, please contact [facility] - A DEPT OF [facility]  Dept: [phone]  and follow the prompts.  Office hours are 8:00 a.m. to 4:30 p.m. Monday - Friday. Please note that voicemails left after 4:00 p.m. may not be returned until the following business day.  We are closed weekends and major holidays. You have access to a nurse at all times for urgent questions. Please call the main number to the clinic Dept: [phone] and follow the prompts.   For any non-urgent questions, you may also contact your provider using MyChart. We now offer e-Visits for anyone 35 and older to request care online for non-urgent symptoms. For details visit [email or web address].   Also download the MyChart app! Go to the app store, search MyChart, open the app, select [place], and log in with your MyChart username and password.

## 2024-02-05 NOTE — Telephone Encounter (Signed)
 Spoke with pt regarding Capecitabine .  Stated that Dr. Lanny has reviewed the pt's labs today and pt's Tbili is elevated; therefore, Dr. Lanny would like to make modifications to the pt's Capecitabine .  Asked pt where is she in her current Capecitabine  Cycle.  Pt stated today 02/05/2024 is the last day of her Capecitabine  and then she will be off for 1 wk.  Pt stated she would restart the Capecitabine  on 02/13/2024.  Pt stated she took her morning dose of Capecitabine .  Stated that Dr. Lanny would like for the pt to not take the evening dose of Capecitabine  and to NOT restart the Capecitabine  on 02/13/2024 as initially planned.  Stated that Dr. Lanny would like for the pt to come in next week to have her labs (CMP) redrawn to see if the pt's Tbili decreases.  Stated Dr. Feng's scheduler will give the pt a call to get her scheduled for the lab appt.  Pt verbalized understanding and had no further questions or concerns at this time.

## 2024-02-12 ENCOUNTER — Telehealth: Payer: Self-pay | Admitting: Hematology

## 2024-02-12 ENCOUNTER — Other Ambulatory Visit: Payer: Self-pay | Admitting: Nurse Practitioner

## 2024-02-12 NOTE — Telephone Encounter (Signed)
 Kim Castro has been contacted and made aware of her 10/27 appointments. She is aware of all appointment details.

## 2024-02-13 ENCOUNTER — Encounter: Payer: Self-pay | Admitting: Hematology

## 2024-02-14 ENCOUNTER — Inpatient Hospital Stay

## 2024-02-14 ENCOUNTER — Other Ambulatory Visit

## 2024-02-14 DIAGNOSIS — C182 Malignant neoplasm of ascending colon: Secondary | ICD-10-CM

## 2024-02-14 DIAGNOSIS — Z7901 Long term (current) use of anticoagulants: Secondary | ICD-10-CM | POA: Diagnosis not present

## 2024-02-14 DIAGNOSIS — Z5111 Encounter for antineoplastic chemotherapy: Secondary | ICD-10-CM | POA: Diagnosis not present

## 2024-02-14 LAB — COMPREHENSIVE METABOLIC PANEL WITH GFR
ALT: 15 U/L (ref 0–44)
AST: 29 U/L (ref 15–41)
Albumin: 4.1 g/dL (ref 3.5–5.0)
Alkaline Phosphatase: 89 U/L (ref 38–126)
Anion gap: 5 (ref 5–15)
BUN: 23 mg/dL (ref 8–23)
CO2: 27 mmol/L (ref 22–32)
Calcium: 10.1 mg/dL (ref 8.9–10.3)
Chloride: 109 mmol/L (ref 98–111)
Creatinine, Ser: 0.73 mg/dL (ref 0.44–1.00)
GFR, Estimated: >60 mL/min (ref >60)
Glucose, Bld: 105 mg/dL — ABNORMAL HIGH (ref 70–99)
Potassium: 4.7 mmol/L (ref 3.5–5.1)
Sodium: 141 mmol/L (ref 135–145)
Total Bilirubin: 1.8 mg/dL — ABNORMAL HIGH (ref 0.0–1.2)
Total Protein: 6.7 g/dL (ref 6.5–8.1)

## 2024-02-14 LAB — CBC WITH DIFFERENTIAL (CANCER CENTER ONLY)
Abs Immature Granulocytes: 0.02 K/uL (ref 0.00–0.07)
Basophils Absolute: 0 K/uL (ref 0.0–0.1)
Basophils Relative: 1 %
Eosinophils Absolute: 0.3 K/uL (ref 0.0–0.5)
Eosinophils Relative: 4 %
HCT: 34.9 % — ABNORMAL LOW (ref 36.0–46.0)
Hemoglobin: 11.9 g/dL — ABNORMAL LOW (ref 12.0–15.0)
Immature Granulocytes: 0 %
Lymphocytes Relative: 40 %
Lymphs Abs: 2.6 K/uL (ref 0.7–4.0)
MCH: 38.1 pg — ABNORMAL HIGH (ref 26.0–34.0)
MCHC: 34.1 g/dL (ref 30.0–36.0)
MCV: 111.9 fL — ABNORMAL HIGH (ref 80.0–100.0)
Monocytes Absolute: 0.7 K/uL (ref 0.1–1.0)
Monocytes Relative: 10 %
Neutro Abs: 2.9 K/uL (ref 1.7–7.7)
Neutrophils Relative %: 45 %
Platelet Count: 178 K/uL (ref 150–400)
RBC: 3.12 MIL/uL — ABNORMAL LOW (ref 3.87–5.11)
RDW: 15.7 % — ABNORMAL HIGH (ref 11.5–15.5)
WBC Count: 6.5 K/uL (ref 4.0–10.5)
nRBC: 0 % (ref 0.0–0.2)

## 2024-02-14 LAB — TOTAL PROTEIN, URINE DIPSTICK: Protein, ur: NEGATIVE mg/dL

## 2024-02-16 ENCOUNTER — Other Ambulatory Visit (HOSPITAL_COMMUNITY): Payer: Self-pay

## 2024-02-16 ENCOUNTER — Other Ambulatory Visit: Payer: Self-pay

## 2024-02-17 ENCOUNTER — Encounter (INDEPENDENT_AMBULATORY_CARE_PROVIDER_SITE_OTHER): Payer: Self-pay

## 2024-02-19 ENCOUNTER — Other Ambulatory Visit: Payer: Self-pay

## 2024-02-19 NOTE — Progress Notes (Signed)
 Clinical Intervention Note  Clinical Intervention Notes: Patient reported that she is to hold her xeloda  for 1 week due to lab results. Confirmed with phone call documented in chart on 10/6 that patient's Tbili was increased and Dr. Lanny wants her to hold medication until next appointment on 10/27.   Clinical Intervention Outcomes: Prevention of an adverse drug event   Advertising account planner

## 2024-02-19 NOTE — Progress Notes (Signed)
 Specialty Pharmacy Refill Coordination Note  Kim Castro is a 78 y.o. female contacted today regarding refills of specialty medication(s) Capecitabine  (XELODA )   Patient requested [name] at [facility] [place] date: 02/24/24   Medication will be filled on 02/23/24.

## 2024-02-23 ENCOUNTER — Other Ambulatory Visit: Payer: Self-pay

## 2024-02-23 NOTE — Progress Notes (Signed)
 Pt's insurance [employer] called stating that the expiration on the pt's CT Scan approval will expire on 03/09/2024 but the pt's CT Scan is scheduled on 03/11/2024 which is after the expiration date.  [employer] stated that an appeal will need to be done to extend the date of the approval.  Stated this nurse will see if the CT Scan can be scheduled on or prior to the expiration date with [facility] Radiology Team.

## 2024-02-25 NOTE — Assessment & Plan Note (Signed)
 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 Path showed overall Stage IIIB cancer.  -s/p 6 months adjuvant FOLFOX, completed 06/01/20, oxaliplatin  held for final 2 cycles due to neuropathy.  -s/p BSO and hysterectomy on 12/22/21 with Dr. Viktoria. Path revealed metastatic moderately differentiated colonic adenocarcinoma involving both ovaries and peritoneum -she restarted FOLFOX on 01/24/22. -Due to disease progression, chemotherapy changed to second line FOLFIRI and bevacizumab  on April 06, 2022. She has been tolerating moderately well, better now with dose reduction, will continue -Restaging CT scan from 09/27/2022 showed continous response in liver and peritoneal metastasis, no other new lesions.   -We discussed maintenance therapy option in future  -due to her PE in 10/2022, will hold beva for 3 months  -restaging CT 12/31/2022 showed stable disease and live met is not well defined on CT. She restarted beva on 01/17/23  -restaging CT 06/19/2023 showed overall stable disease (stable small lung nodules, no other evidence of cancer) Restaging CT 09/12/2023 showed no residual radiographic evidence of disease.   - We discussed option of maintenance therapy, treatment changed to capecitabine  and bevacizumab  on 10/02/2023 -CT 12/18/23 showed stable disease

## 2024-02-26 ENCOUNTER — Inpatient Hospital Stay

## 2024-02-26 ENCOUNTER — Inpatient Hospital Stay (HOSPITAL_BASED_OUTPATIENT_CLINIC_OR_DEPARTMENT_OTHER): Admitting: Hematology

## 2024-02-26 VITALS — BP 116/68 | HR 64 | Temp 98.2°F | Resp 15 | Ht 65.5 in | Wt 218.9 lb

## 2024-02-26 DIAGNOSIS — C182 Malignant neoplasm of ascending colon: Secondary | ICD-10-CM

## 2024-02-26 DIAGNOSIS — Z5111 Encounter for antineoplastic chemotherapy: Secondary | ICD-10-CM | POA: Diagnosis not present

## 2024-02-26 LAB — CBC WITH DIFFERENTIAL (CANCER CENTER ONLY)
Abs Immature Granulocytes: 0.01 K/uL (ref 0.00–0.07)
Basophils Absolute: 0 K/uL (ref 0.0–0.1)
Basophils Relative: 1 %
Eosinophils Absolute: 0.2 K/uL (ref 0.0–0.5)
Eosinophils Relative: 3 %
HCT: 34 % — ABNORMAL LOW (ref 36.0–46.0)
Hemoglobin: 11.4 g/dL — ABNORMAL LOW (ref 12.0–15.0)
Immature Granulocytes: 0 %
Lymphocytes Relative: 38 %
Lymphs Abs: 1.9 K/uL (ref 0.7–4.0)
MCH: 37.5 pg — ABNORMAL HIGH (ref 26.0–34.0)
MCHC: 33.5 g/dL (ref 30.0–36.0)
MCV: 111.8 fL — ABNORMAL HIGH (ref 80.0–100.0)
Monocytes Absolute: 0.6 K/uL (ref 0.1–1.0)
Monocytes Relative: 11 %
Neutro Abs: 2.4 K/uL (ref 1.7–7.7)
Neutrophils Relative %: 47 %
Platelet Count: 135 K/uL — ABNORMAL LOW (ref 150–400)
RBC: 3.04 MIL/uL — ABNORMAL LOW (ref 3.87–5.11)
RDW: 13.7 % (ref 11.5–15.5)
WBC Count: 5.1 K/uL (ref 4.0–10.5)
nRBC: 0 % (ref 0.0–0.2)

## 2024-02-26 LAB — COMPREHENSIVE METABOLIC PANEL WITH GFR
ALT: 14 U/L (ref 0–44)
AST: 28 U/L (ref 15–41)
Albumin: 3.9 g/dL (ref 3.5–5.0)
Alkaline Phosphatase: 87 U/L (ref 38–126)
Anion gap: 3 — ABNORMAL LOW (ref 5–15)
BUN: 25 mg/dL — ABNORMAL HIGH (ref 8–23)
CO2: 28 mmol/L (ref 22–32)
Calcium: 9.6 mg/dL (ref 8.9–10.3)
Chloride: 111 mmol/L (ref 98–111)
Creatinine, Ser: 0.77 mg/dL (ref 0.44–1.00)
GFR, Estimated: >60 mL/min (ref >60)
Glucose, Bld: 104 mg/dL — ABNORMAL HIGH (ref 70–99)
Potassium: 5.2 mmol/L — ABNORMAL HIGH (ref 3.5–5.1)
Sodium: 142 mmol/L (ref 135–145)
Total Bilirubin: 1.6 mg/dL — ABNORMAL HIGH (ref 0.0–1.2)
Total Protein: 6.7 g/dL (ref 6.5–8.1)

## 2024-02-26 LAB — TOTAL PROTEIN, URINE DIPSTICK: Protein, ur: NEGATIVE mg/dL

## 2024-02-26 MED ORDER — PROCHLORPERAZINE MALEATE 10 MG PO TABS: 10.0000 mg | ORAL_TABLET | Freq: Four times a day (QID) | ORAL | 1 refills | Status: AC | PRN

## 2024-02-26 MED ORDER — SODIUM CHLORIDE 0.9 % IV SOLN: INTRAVENOUS | Status: DC

## 2024-02-26 MED ORDER — LIDOCAINE-PRILOCAINE 2.5-2.5 % EX CREA: TOPICAL_CREAM | CUTANEOUS | 3 refills | Status: AC

## 2024-02-26 MED ORDER — ONDANSETRON HCL 8 MG PO TABS: 8.0000 mg | ORAL_TABLET | Freq: Three times a day (TID) | ORAL | 1 refills | Status: AC | PRN

## 2024-02-26 MED ORDER — SODIUM CHLORIDE 0.9 % IV SOLN
5.0000 mg/kg | Freq: Once | INTRAVENOUS | Status: AC
  Administered 2024-02-26: 500 mg via INTRAVENOUS
  Filled 2024-02-26: qty 4

## 2024-02-26 NOTE — Progress Notes (Signed)
 [facility]   [phone]) [phone] [phone]   Clinic Follow up Note   Patient Care Team: [name], MD as PCP - General (Family Medicine) [name], MD as Consulting Physician (General Surgery) [name], MD as Consulting Physician (Hematology) [name], MD as Consulting Physician (General Surgery) [name], MD as Consulting Physician (Hematology and Oncology) [facility], [facility]   Date of Service:  02/26/2024  CHIEF COMPLAINT: f/u of metastatic colon cancer  CURRENT THERAPY:  Xeloda  and bevacizumab   Oncology History   Malignant neoplasm of ascending colon (HCC) pT3N2aM0 stage IIB, MSS, metastatic to b/l ovaries and peritoneum and liver, KRAS mutation G12V (+)  -Initially diagnosed in 08/2019, s/p resection with Dr Debby on 11/01/19. Path showed overall Stage IIIB cancer.  -s/p 6 months adjuvant FOLFOX, completed 06/01/20, oxaliplatin  held for final 2 cycles due to neuropathy.  -s/p BSO and hysterectomy on 12/22/21 with Dr. Viktoria. Path revealed metastatic moderately differentiated colonic adenocarcinoma involving both ovaries and peritoneum -she restarted FOLFOX on 01/24/22. -Due to disease progression, chemotherapy changed to second line FOLFIRI and bevacizumab  on April 06, 2022. She has been tolerating moderately well, better now with dose reduction, will continue -Restaging CT scan from 09/27/2022 showed continous response in liver and peritoneal metastasis, no other new lesions.   -We discussed maintenance therapy option in future  -due to her PE in 10/2022, will hold beva for 3 months  -restaging CT 12/31/2022 showed stable disease and live met is not well defined on CT. She restarted beva on 01/17/23  -restaging CT 06/19/2023 showed overall stable disease (stable small lung nodules, no other evidence of cancer) Restaging CT 09/12/2023 showed no residual radiographic evidence of disease.   - We discussed  option of maintenance therapy, treatment changed to capecitabine  and bevacizumab  on 10/02/2023 -CT 12/18/23 showed stable disease  Assessment & Plan Metastatic colon cancer Metastatic colon cancer with current treatment involving oral chemotherapy (Xeloda ). Considering switching back to infusion therapy (5FU pump) due to better tolerance and improved bilirubin levels. Bilirubin levels have improved to 1.6, allowing for potential change in treatment modality. She has been experiencing fatigue, which may be related to the current oral chemotherapy regimen. - Continue Xeloda  with one week on, one week off schedule until next visit. - Switch to 5FU pump infusion therapy in two weeks, pending insurance approval. - Administer lower dose of bevacizumab  today due to change in treatment schedule to every two weeks. - Schedule scan for November 4th to assess current status.  Chemotherapy-induced fatigue Fatigue likely secondary to chemotherapy regimen. She reports increased tiredness and episodes of falling asleep during the day. No dizziness reported, but lightheadedness occurs when transitioning from lying down to sitting up.  Right foot dog bite with contusion and minor bleeding on anticoagulation Recent dog bite on the right foot resulting in contusion and minor bleeding, exacerbated by anticoagulation therapy (Eliquis ). The wound is small with a few puncture sites, and there is significant bruising. The bleeding was controlled quickly, and the wound is being managed with Betadine and kept clean. - Continue wound care with Betadine and maintain cleanliness.   Plan - Lab reviewed, adequate for treatment, will proceed to bevacizumab  today - She will restart Xeloda  tonight, for 7 days on and 7 days off.  Plan to change to 5-FU pump infusion in 2 weeks - Follow-up in 2 weeks before bevacizumab  and 5-FU pump infusion  SUMMARY OF ONCOLOGIC HISTORY: Oncology History Overview Note   Cancer Staging  Malignant neoplasm of female breast [facility]) Staging form: Breast, AJCC 7th Edition - Clinical stage from 06/11/2014: Stage 0 (Tis (DCIS), N0, M0) - Unsigned Staged by: Pathologist and managing physician Laterality: Left Estrogen receptor status: Positive Progesterone receptor status: Positive Stage used in treatment planning: Yes National guidelines used in treatment planning: Yes Type of national guideline used in treatment planning: NCCN - Pathologic stage from 07/03/2014: Stage Unknown (Tis (DCIS), NX, cM0) - Signed by Guinevere Chew, MD on 07/10/2014 Staged by: Pathologist Laterality: Left Estrogen receptor status: Positive Progesterone receptor status: Positive Stage used in treatment planning: Yes National guidelines used in treatment planning: Yes Type of national guideline used in treatment planning: NCCN Staging comments: Staged on final lumpectomy specimen by Dr. Loral.  Right colon cancer Staging form: Colon and Rectum, AJCC 8th Edition - Pathologic stage from 11/01/2019: Stage IIIB (pT3, pN2a, cM0) - Signed by [name], MD on 11/29/2019 Stage prefix: Initial diagnosis Histologic grading system: 4 grade system Histologic grade (G): G2 Residual tumor (R): R0 - None Tumor deposits (TD): Absent Perineural invasion (PNI): Absent Microsatellite instability (MSI): Stable KRAS mutation: Unknown NRAS mutation: Unknown BRAF mutation: Unknown     Malignant neoplasm of female breast (HCC)  05/29/2014 Initial Biopsy   Left breast needle core biopsy: Grade 2, DCIS with calcs. ER+ (100%), PR+ (96%).    06/04/2014 Initial Diagnosis   Left breast DCIS with calcifications, ER 100%, PR 96%   06/10/2014 Breast MRI   Left breast: 2.4 x 1.3 x 1.1 cm area of patchy non-mass enhancement upper outer quadrant includes postbiopsy seroma; Right breast: 1.2 cm previously biopsied stable benign fibroadenoma   06/12/2014 Procedure   Genetic counseling/testing: Identified 1 VUS on CHEK2 gene. Remainder  of 17 gene panel tested negative and included: ATM, BARD1, BRCA 1/2, BRIP1, CDH1, CHEK2, EPCAM, MLH1, MSH2, MSH6, NBN, NF1, PALB2, PTEN, RAD50, RAD51C, RAD51D, STK11, and TP53.    07/01/2014 Surgery   Left breast lumpectomy ([facility]): Grade 1, DCIS, spanning 2.3 cm, 1 mm margin, ER 100%, PR 96%   07/31/2014 - 08/28/2014 Radiation Therapy   Adjuvant RT completed Signe). Left breast: Total dose 42.5 Gy over 17 fractions. Left breast boost: Total dose 7.5 Gy over 3 fractions.    09/14/2014 - 01/13/2020 Anti-estrogen oral therapy   Anastrazole 1mg  daily. Planned duration of treatment: 5 years ([place]). Completed in 01/2020.    09/25/2014 Survivorship   Survivorship Care Plan given to patient and reviewed with her in person.    03/02/2021 Imaging   CT CAP  IMPRESSION: 1. No findings of active/recurrent malignancy. Partial right hemicolectomy. 2. Endometrial stripe remains mildly thickened, but endometrial biopsy in May was negative for malignancy. 3. Progressive endplate sclerosis and endplate irregularity at T2-3, probably due to degenerative endplate findings. If the has referable upper thoracic pain/symptoms then thoracic spine MRI could be used for further workup. 4. Other imaging findings of potential clinical significance: Mild cardiomegaly. Aortic Atherosclerosis (ICD10-I70.0). Mild mitral valve calcification. Postoperative findings in the left breast with adjacent radiation port anteriorly in the left upper lobe. Tiny pulmonary nodules in the left lower lobe are unchanged from earliest available comparison of 10/17/2019 and probably benign although may merit surveillance. Multilevel lumbar impingement. Mild pelvic floor laxity.   Malignant neoplasm of ascending colon (HCC)  09/19/2019 Imaging   CT AP W contrast 09/19/19  IMPRESSION Fullness in the cecum, cannot exclude a mass. No evidence for metastatic disease is identified.    09/24/2019 Procedure   Colonoscopy by Dr Ernesto  09/24/19 IMPRESSION 1. The colon was redundant  2. Mild diverticulosis was noted through the entire examined colon 3. Single 12mm polyp was found in the ascending colon; polypectomy was performed using snare cautery and biopsy forceps 4. Mild diverticulosis was notes in the descending colon and sigmoid colon.  5. Single polyp was found in the sigmoid colon, polypectomy was performed with cold forceps.  6. Single polyp was found in the rectosigmoid colon; polypectomy was performed with cold snare  7. Small internal hemorrhoids  8. Large mass was found at the cecum; multiple biopsies of the area were performed using cold forceps; injection (tattooing) was performed distal to the mass.    09/24/2019 Initial Biopsy   INTERPRETATION AND DIAGNOSIS:  A. Cecum, biopsy:  Invasive moderately differentiated adenocarcinoma.  see comment  B. Polyp @ ascending colon, polypectomy:  Tubular Adenoma  C. Polyp @ sigmoid colon Polypectomy:  hyperplastic polyp.  D. Polyp @ rectosigmoid colon, Polypectomy:  Hyperplastic Polyp      10/16/2019 Imaging   CT Chest IMPRESSION: 1. Multiple small pulmonary nodules measuring 5 mm or less in size in the lungs. These are nonspecific and are typically considered statistically likely benign. However, given the patient's history of primary malignancy, close attention on follow-up studies is recommended to ensure stability. 2. Aortic atherosclerosis, in addition to right coronary artery disease. Assessment for potential risk factor modification, dietary therapy or pharmacologic therapy may be warranted, if clinically indicated. 3. There are calcifications of the aortic valve and mitral annulus. Echocardiographic correlation for evaluation of potential valvular dysfunction may be warranted if clinically indicated. 4. Small hiatal hernia.   Aortic Atherosclerosis (ICD10-I70.0).   11/01/2019 Initial Diagnosis   Colon cancer (HCC)   11/01/2019 Surgery    LAPAROSCOPIC PARTIAL COLECTOMY by Dr Debby and Dr Sheldon   11/01/2019 Pathology Results   FINAL MICROSCOPIC DIAGNOSIS:   A. COLON, PROXIMAL RIGHT, COLECTOMY:  - Invasive colonic adenocarcinoma, 5 cm.  - Tumor invades through the muscularis propria into pericolonic tissues.   - Margins of resection are not involved.  - Metastatic carcinoma in (5) of (13) lymph nodes.  - See oncology table.    MSI Stable  Mismatch repair normal  MLH1 - Preserved nuclear expression (greater 50% tumor expression) MSH2 - Preserved nuclear expression (greater 50% tumor expression) MSH6 - Preserved nuclear expression (greater 50% tumor expression) PMS2 - Preserved nuclear expression (greater 50% tumor expression)   11/01/2019 Cancer Staging   Staging form: Colon and Rectum, AJCC 8th Edition - Pathologic stage from 11/01/2019: Stage IIIB (pT3, pN2a, cM0) - Signed by [name], MD on 11/29/2019   12/10/2019 Procedure   PAC placed 12/10/19   12/17/2019 - 06/01/2020 Chemotherapy   FOLFOX q2weeks starting in 2 weeks starting 12/17/19. Held 01/27/20-02/10/20 due to b/l PE. Oxaliplatin  held C11-12 due to neuropathy. Completed on 06/01/20   03/02/2021 Imaging   CT CAP  IMPRESSION: 1. No findings of active/recurrent malignancy. Partial right hemicolectomy. 2. Endometrial stripe remains mildly thickened, but endometrial biopsy in May was negative for malignancy. 3. Progressive endplate sclerosis and endplate irregularity at T2-3, probably due to degenerative endplate findings. If the has referable upper thoracic pain/symptoms then thoracic spine MRI could be used for further workup. 4. Other imaging findings of potential clinical significance: Mild cardiomegaly. Aortic Atherosclerosis (ICD10-I70.0). Mild mitral valve calcification. Postoperative findings in the left breast with adjacent radiation port anteriorly in the left upper lobe. Tiny pulmonary nodules in the left lower lobe are unchanged from earliest available  comparison  of 10/17/2019 and probably benign although may merit surveillance. Multilevel lumbar impingement. Mild pelvic floor laxity.   08/26/2021 Imaging   EXAM: CT CHEST, ABDOMEN, AND PELVIS WITH CONTRAST  IMPRESSION: 1. Stable examination without new or progressive findings to suggest local recurrence or metastatic disease within the chest, abdomen, or pelvis. 2. Hepatomegaly with hepatic steatosis. 3. Sigmoid colonic diverticulosis without findings of acute diverticulitis. 4. Similar prominent endplate sclerosis and irregularity at T2-T3 is most consistent with Modic type endplate degenerative changes. However, if patient has referable upper thoracic pain consider further workup with thoracic spine MRI. 5. Similar thickening of the endometrial stripe measuring up to 8 mm, which was previously biopsied with results negative for malignancy. 6. Aortic Atherosclerosis (ICD10-I70.0).   11/10/2021 PET scan   IMPRESSION: 1. LEFT ovary is increased in size and is intensely hypermetabolic. While physiologic hypermetabolic ovarian tissue is not uncommon, the enlargement and asymmetric activity warrants further evaluation. Consider contrast pelvic MRI vs tissue sampling. 2. No evidence of metastatic colorectal carcinoma otherwise. 3. Post RIGHT hemicolectomy anatomy. 4. Evidence of radiation change in the LEFT upper lobe (remote breast cancer).     12/22/2021 Relapse/Recurrence    FINAL MICROSCOPIC DIAGNOSIS:   A. LEFT OVARY AND FALLOPIAN TUBE, SALPINGO OOPHORECTOMY:  - Metastatic moderately differentiated colonic adenocarcinoma involving left ovary  - Focal ovarian stromal calcification  - Segment of benign fallopian tube   B. UTERUS WITH RIGHT FALLOPIAN TUBE AND OVARY, HYSTERECTOMY AND SALPINGO-OOPHORECTOMY:  - Metastatic moderately differentiated colonic adenocarcinoma involving right ovary  - Focal invasive extensive adenomyosis  - Benign endometrial polyps  - Benign proliferative  phase endometrium  - Hydrosalpinx of right fallopian tube  - Focal ovarian stromal calcification   C. PERITONEAL DEPOSITS, ANTERIOR CUL DE SAC, BIOPSY:  - Metastatic mucinous adenocarcinoma, consistent with colorectal primary    COMMENT:  Immunohistochemical stains show that the tumor cells are positive for CK20 and CDX2 while they are negative for CK7 and PAX8, consistent with above interpretation.    01/24/2022 - 03/23/2022 Chemotherapy   Patient is on Treatment Plan : COLORECTAL FOLFOX q14d x 3 months     04/01/2022 Imaging   CT AP IMPRESSION: 1. New omental metastatic disease. 2. Vague hypoattenuating lesion in the inferior right hepatic lobe is new and also worrisome for metastatic disease. 3. Similar small right lower lobe nodules. Recommend continued attention on follow-up. 4. Steatotic enlarged liver. 5.  Aortic atherosclerosis (ICD10-I70.0).   04/06/2022 - 09/18/2023 Chemotherapy   Patient is on Treatment Plan : COLORECTAL FOLFIRI + Bevacizumab  q14d     04/21/2022 Imaging    IMPRESSION: 1. Stable chest CT. No evidence of metastatic disease. 2. Stable small pulmonary nodules, considered benign based on stability. 3. Stable postsurgical changes in the left breast and anterior left upper lobe radiation changes. 4. Aortic Atherosclerosis (ICD10-I70.0) and Emphysema (ICD10-J43.9).   04/21/2022 Imaging    IMPRESSION: 1. Small enhancing lesion inferiorly in the right hepatic lobe is typical of metastatic disease and stable from recent abdominal CT. No other definite liver lesions are identified. Mild hepatic steatosis. 2. No other significant abdominal findings. 3. Omental nodularity seen on CT is not well visualized by MRI.    Imaging     06/23/2022 Imaging    IMPRESSION: 1. Hypodense lesion of the inferior right lobe of the liver, hepatic segment VI is diminished in size, consistent with treatment response. 2. Tiny peritoneal and omental nodules identified by  prior examination are diminished in size, consistent with treatment response. 3.  Multiple tiny pulmonary nodules unchanged, most likely benign and incidental, however continued attention on follow-up warranted in the setting of known metastatic disease. 4. No evidence of new metastatic disease in the chest, abdomen, or pelvis. 5. Status post partial right hemicolectomy and reanastomosis. 6. Diffuse mosaic attenuation of the airspaces, consistent with small airways disease. 7. Hepatomegaly.   10/02/2023 -  Chemotherapy   Patient is on Treatment Plan : COLORECTAL Bevacizumab  q21d     12/18/2023 Imaging   CT CAP with contrast IMPRESSION: 1. Prior partial right hemicolectomy with ileocolic anastomosis. No suspicious nodularity along the suture line. Prominent adjacent lymph nodes are similar to prior. 2. Stable tiny scattered pulmonary nodules. 3. No evidence of new or progressive disease in the chest, abdomen or pelvis. 4. Hepatomegaly with hepatic steatosis. 5. Aortic atherosclerosis.        Discussed the use of AI scribe software for clinical note transcription with the patient, who gave verbal consent to proceed.  History of Present Illness Kim Castro is a 78 year old female with metastatic colon cancer who presents for follow-up.  She experiences fatigue, which she attributes to her oral chemotherapy regimen with Xeloda . She takes Xeloda  one week on and one week off but has not refilled her prescription and has about a week and a day and a half of medication left. She has not restarted the medication since it was previously stopped. She feels physically better when receiving infusion therapy compared to oral chemotherapy.  Her most recent bilirubin level was 1.6. She denies significant pain but experiences lightheadedness when transitioning from lying down to sitting up, which resolves within a few seconds. She has been feeling very tired over the past week, often falling  asleep while sitting on the couch.  She is currently taking Eliquis , potassium, and pregabalin . A recent dog bite on her foot resulted in a bruise and small puncture wounds, with increased bleeding initially due to Eliquis , but it stopped quickly. She has been keeping the wound clean with Betadine.     All other systems were reviewed with the patient and are negative.  MEDICAL HISTORY:  Past Medical History:  Diagnosis Date   Aortic atherosclerosis    Arthritis    feet, lower back   Basal cell carcinoma    arm   BRCA gene mutation negative    Breast cancer of upper-outer quadrant of left female breast (HCC) 06/04/2014   Cataract    immature on the left   Colon cancer (HCC) 08/2019   Diabetes mellitus without complication (HCC)    Diverticulosis    Dizziness    > 29yrs ago;took Antivert    Family history of anesthesia complication    sister slow to wake up with anesthesia   Family history of breast cancer    Family history of colon cancer    Family history of uterine cancer    GERD (gastroesophageal reflux disease)    takes occasional TUMs   History of bronchitis    > 15yrs ago   History of colon polyps    History of hiatal hernia    Small noted on CT   History of pulmonary embolus (PE)    Hypertension    takes Losartan  daily and HCTZ   Iron deficiency anemia    Joint pain    Metastatic disease (HCC) 2023   peritoneum and liver mets   Numbness    to toes on each foot   Peripheral neuropathy    feet and  toes   Personal history of radiation therapy    Pulmonary nodules    Noted on CT   Radiation 07/31/14-08/28/14   Left Breast 20 fxs   Seasonal allergies    takes Claritin prn   Urinary frequency    Vitamin D deficiency    takes VIt D daily    SURGICAL HISTORY: Past Surgical History:  Procedure Laterality Date   BREAST BIOPSY Bilateral    BREAST LUMPECTOMY Left    BREAST LUMPECTOMY WITH RADIOACTIVE SEED LOCALIZATION Left 07/01/2014   Procedure: LEFT BREAST  LUMPECTOMY WITH RADIOACTIVE SEED LOCALIZATION;  Surgeon: Morene Olives, MD;  Location: [facility];  Service: General;  Laterality: Left;   CATARACT EXTRACTION Right    COLONOSCOPY  09/24/2019   [place]   LAPAROSCOPIC PARTIAL COLECTOMY N/A 11/01/2019   Procedure: LAPAROSCOPIC PARTIAL COLECTOMY;  Surgeon: [name], MD;  Location: [facility];  Service: General;  Laterality: N/A;   POLYPECTOMY     PORTACATH PLACEMENT N/A 12/10/2019   Procedure: INSERTION PORT-A-CATH ULTRASOUND GUIDED IN RIGHT IJ;  Surgeon: [name], MD;  Location: [facility];  Service: General;  Laterality: N/A;   ROBOTIC ASSISTED TOTAL HYSTERECTOMY Bilateral 12/22/2021   Procedure: XI ROBOTIC ASSISTED TOTAL HYSTERECTOMY WITH BILATERAL SALPINGO OOPHORECTOMY;  Surgeon: Viktoria Comer SAUNDERS, MD;  Location: [facility];  Service: Gynecology;  Laterality: Bilateral;   TOTAL KNEE ARTHROPLASTY Left 10/24/2012   Procedure: TOTAL KNEE ARTHROPLASTY;  Surgeon: Dempsey JINNY Sensor, MD;  Location: [facility];  Service: Orthopedics;  Laterality: Left;   TOTAL KNEE ARTHROPLASTY Right 01/09/2013   Procedure: TOTAL KNEE ARTHROPLASTY;  Surgeon: Dempsey JINNY Sensor, MD;  Location: [facility];  Service: Orthopedics;  Laterality: Right;   TUBAL LIGATION      I have reviewed the social history and family history with the patient and they are unchanged from previous note.  ALLERGIES:  is allergic to oxycodone .  MEDICATIONS:  Current Outpatient Medications  Medication Sig Dispense Refill   b complex vitamins capsule Take 1 capsule by mouth daily.     capecitabine  (XELODA ) 500 MG tablet Take 3 tabs in morning and 4 tabs in evening. Take within 30 minutes after meals. Take for 7 days on and 7 days off, then repeat. 98 tablet 1   Cholecalciferol (VITAMIN D) 2000 UNITS CAPS Take 2,000 Units by mouth daily.      ELIQUIS  5 MG TABS tablet Take 1 tablet by mouth twice daily 60 tablet 0   famotidine (PEPCID) 20 MG tablet Take 20 mg by mouth daily.     ibuprofen  (ADVIL) 200 MG tablet Take 200 mg by mouth daily as needed (pain).     lidocaine -prilocaine  (EMLA ) cream Apply 1 Application topically as needed. 30 g 1   loratadine (CLARITIN) 10 MG tablet Take 10 mg by mouth daily as needed for allergies.     losartan  (COZAAR ) 25 MG tablet Take 1 tablet by mouth once daily 90 tablet 2   magic mouthwash (nystatin , lidocaine , diphenhydrAMINE , alum & mag hydroxide) suspension Swish and swallow 4 times daily as needed for mouth pain. 140 mL 1   metFORMIN  (GLUCOPHAGE ) 500 MG tablet TAKE 1 TABLET BY MOUTH IN THE MORNING AND AT BEDTIME 180 tablet 3   Multiple Vitamins-Minerals (MULTIVITAMIN WITH MINERALS) tablet Take 1 tablet by mouth daily.     ondansetron  (ZOFRAN ) 8 MG tablet Take 1 tablet (8 mg total) by mouth every 8 (eight) hours as needed for nausea or vomiting. Start on the third day after  chemotherapy. 30 tablet 1   potassium chloride  (KLOR-CON ) 10 MEQ tablet Take 1 tablet by mouth twice daily 60 tablet 0   pravastatin  (PRAVACHOL ) 10 MG tablet Take 1 tablet by mouth once daily 90 tablet 3   pregabalin  (LYRICA ) 25 MG capsule TAKE 1 CAPSULE BY MOUTH ONCE DAILY IN ADDITION TO 50 MG  AT BEDTIME 30 capsule 1   pregabalin  (LYRICA ) 50 MG capsule TAKE 1 CAPSULE(50MG ) BY MOUTH TWICE DAILY IN ADDITION TO THE 25 MG CAPSULE AT BEDTIME FOR A TOTAL OF 75MG  AT BEDTIME. 60 capsule 0   prochlorperazine  (COMPAZINE ) 10 MG tablet Take 1 tablet (10 mg total) by mouth every 6 (six) hours as needed for nausea or vomiting. 30 tablet 1   traMADol  (ULTRAM ) 50 MG tablet Take 1 tablet (50 mg total) by mouth every 6 (six) hours as needed. 30 tablet 0   triamcinolone  ointment (KENALOG ) 0.5 % Apply 1 Application topically 2 (two) times daily. 30 g 0   No current facility-administered medications for this visit.    PHYSICAL EXAMINATION: ECOG PERFORMANCE STATUS: 2 - Symptomatic, <50% confined to bed  Vitals:   02/26/24 0940  BP: 116/68  Pulse: 64  Resp: 15  Temp: 98.2 F (36.8 C)   SpO2: 99%   Wt Readings from Last 3 Encounters:  02/26/24 218 lb 14.4 oz (99.3 kg)  02/05/24 210 lb 11.2 oz (95.6 kg)  01/15/24 209 lb 8 oz (95 kg)     GENERAL:alert, no distress and comfortable SKIN: skin color, texture, turgor are normal, no rashes or significant lesions EYES: normal, Conjunctiva are pink and non-injected, sclera clear NECK: supple, thyroid normal size, non-tender, without nodularity LYMPH:  no palpable lymphadenopathy in the cervical, axillary  LUNGS: clear to auscultation and percussion with normal breathing effort HEART: regular rate & rhythm and no murmurs and no lower extremity edema ABDOMEN:abdomen soft, non-tender and normal bowel sounds Musculoskeletal:no cyanosis of digits and no clubbing  NEURO: alert & oriented x 3 with fluent speech, no focal motor/sensory deficits  Physical Exam VITALS: BP- 116/  LABORATORY DATA:  I have reviewed the data as listed    Latest Ref Rng & Units 02/26/2024    9:12 AM 02/14/2024   10:14 AM 02/05/2024    7:42 AM  CBC  WBC 4.0 - 10.5 K/uL 5.1  6.5  4.6   Hemoglobin 12.0 - 15.0 g/dL 88.5  88.0  88.3   Hematocrit 36.0 - 46.0 % 34.0  34.9  33.7   Platelets 150 - 400 K/uL 135  178  156         Latest Ref Rng & Units 02/26/2024    9:12 AM 02/14/2024   10:14 AM 02/05/2024    7:42 AM  CMP  Glucose 70 - 99 mg/dL 895  894  849   BUN 8 - 23 mg/dL 25  23  25    Creatinine 0.44 - 1.00 mg/dL 9.22  9.26  9.15   Sodium 135 - 145 mmol/L 142  141  141   Potassium 3.5 - 5.1 mmol/L 5.2  4.7  4.5   Chloride 98 - 111 mmol/L 111  109  108   CO2 22 - 32 mmol/L 28  27  27    Calcium  8.9 - 10.3 mg/dL 9.6  89.8  89.6   Total Protein 6.5 - 8.1 g/dL 6.7  6.7  6.6   Total Bilirubin 0.0 - 1.2 mg/dL 1.6  1.8  2.8   Alkaline Phos 38 - 126 U/L 87  89  77   AST 15 - 41 U/L 28  29  24    ALT 0 - 44 U/L 14  15  17        RADIOGRAPHIC STUDIES: I have personally reviewed the radiological images as listed and agreed with the findings in the  report. No results found.    No orders of the defined types were placed in this encounter.  All questions were answered. The patient knows to call the clinic with any problems, questions or concerns. No barriers to learning was detected. The total time spent in the appointment was 40 minutes, including review of chart and various tests results, discussions about plan of care and coordination of care plan     Onita Mattock, MD 02/26/2024

## 2024-02-26 NOTE — Patient Instructions (Signed)
 [facility] - A DEPT OF [place]  Discharge Instructions: Thank you for choosing [facility] to provide your oncology and hematology care.   If you have a lab appointment with the Cancer Center, please go directly to the Cancer Center and check in at the registration area.   Wear comfortable clothing and clothing appropriate for easy access to any Portacath or PICC line.   We strive to give you quality time with your provider. You may need to reschedule your appointment if you arrive late (15 or more minutes).  Arriving late affects you and other patients whose appointments are after yours.  Also, if you miss three or more appointments without notifying the office, you may be dismissed from the clinic at the provider's discretion.      For prescription refill requests, have your pharmacy contact our office and allow 72 hours for refills to be completed.    Today you received the following chemotherapy and/or immunotherapy agents Bevacizumab       To help prevent nausea and vomiting after your treatment, we encourage you to take your nausea medication as directed.  BELOW ARE SYMPTOMS THAT SHOULD BE REPORTED IMMEDIATELY: *FEVER GREATER THAN 100.4 F (38 C) OR HIGHER *CHILLS OR SWEATING *NAUSEA AND VOMITING THAT IS NOT CONTROLLED WITH YOUR NAUSEA MEDICATION *UNUSUAL SHORTNESS OF BREATH *UNUSUAL BRUISING OR BLEEDING *URINARY PROBLEMS (pain or burning when urinating, or frequent urination) *BOWEL PROBLEMS (unusual diarrhea, constipation, pain near the anus) TENDERNESS IN MOUTH AND THROAT WITH OR WITHOUT PRESENCE OF ULCERS (sore throat, sores in mouth, or a toothache) UNUSUAL RASH, SWELLING OR PAIN  UNUSUAL VAGINAL DISCHARGE OR ITCHING   Items with * indicate a potential emergency and should be followed up as soon as possible or go to the Emergency Department if any problems should occur.  Please show the CHEMOTHERAPY ALERT CARD or IMMUNOTHERAPY  ALERT CARD at check-in to the Emergency Department and triage nurse.  Should you have questions after your visit or need to cancel or reschedule your appointment, please contact [facility]  Dept: [phone]  and follow the prompts.  Office hours are 8:00 a.m. to 4:30 p.m. Monday - Friday. Please note that voicemails left after 4:00 p.m. may not be returned until the following business day.  We are closed weekends and major holidays. You have access to a nurse at all times for urgent questions. Please call the main number to the clinic Dept: [phone] and follow the prompts.   For any non-urgent questions, you may also contact your provider using MyChart. We now offer e-Visits for anyone 64 and older to request care online for non-urgent symptoms. For details visit [email or web address].   Also download the MyChart app! Go to the app store, search MyChart, open the app, select [facility], and log in with your MyChart username and password.

## 2024-02-27 ENCOUNTER — Other Ambulatory Visit: Payer: Self-pay | Admitting: Nurse Practitioner

## 2024-02-27 ENCOUNTER — Telehealth: Payer: Self-pay

## 2024-02-27 NOTE — Telephone Encounter (Signed)
 Oral Oncology Patient Advocate Encounter   Received notification that prior authorization for   lidocaine -prilocaine  (EMLA ) cream   is required.   PA submitted on 02/27/2024 Key [id number] Status is pending      Charlott Hamilton,  CPhT-Adv  she/her/hers [facility]  [facility] Pharmacy Technician Patient Advocate Specialist III [facility] Phone: [phone]  Fax: [phone] [email or web address]

## 2024-02-28 NOTE — Telephone Encounter (Signed)
 Oral Oncology Patient Advocate Encounter  Prior Authorization for lidocaine -prilocaine  (EMLA ) cream  has been approved.    PA# [id number] Effective dates: 02/27/2024 through 02/26/2025 Patient has been notified via MyChart    Charlott Hamilton,  CPhT-Adv  she/her/hers [facility]  [facility] Pharmacy Technician Patient Advocate Specialist III [facility] Phone: [phone]  Fax: [phone] [email or web address]

## 2024-03-04 ENCOUNTER — Other Ambulatory Visit (HOSPITAL_COMMUNITY): Payer: Self-pay

## 2024-03-04 ENCOUNTER — Other Ambulatory Visit: Payer: Self-pay

## 2024-03-04 ENCOUNTER — Other Ambulatory Visit: Payer: Self-pay | Admitting: Pharmacy Technician

## 2024-03-04 NOTE — Progress Notes (Signed)
 Pharmacist Chemotherapy Monitoring - Initial Assessment    Anticipated start date: 03/11/24   The following has been reviewed per standard work regarding the patient's treatment regimen: The patient's diagnosis, treatment plan and drug doses, and organ/hematologic function Lab orders and baseline tests specific to treatment regimen  The treatment plan start date, drug sequencing, and pre-medications Prior authorization status  Patient's documented medication list, including drug-drug interaction screen and prescriptions for anti-emetics and supportive care specific to the treatment regimen The drug concentrations, fluid compatibility, administration routes, and timing of the medications to be used The patient's access for treatment and lifetime cumulative dose history, if applicable  The patient's medication allergies and previous infusion related reactions, if applicable   Changes made to treatment plan:  N/A  Follow up needed:  N/A  Harlene JONELLE Nasuti, RPH, 03/04/2024  11:17 AM

## 2024-03-04 NOTE — Progress Notes (Signed)
 Disenroll; Spoke with Patient & stated no longer on tablets &  had [facility] check changing over to 5-FU infusion.

## 2024-03-05 ENCOUNTER — Ambulatory Visit (HOSPITAL_COMMUNITY)

## 2024-03-06 ENCOUNTER — Other Ambulatory Visit: Payer: Self-pay | Admitting: Nurse Practitioner

## 2024-03-07 ENCOUNTER — Other Ambulatory Visit: Payer: Self-pay

## 2024-03-08 ENCOUNTER — Encounter: Payer: Self-pay | Admitting: Hematology

## 2024-03-08 ENCOUNTER — Ambulatory Visit (HOSPITAL_COMMUNITY)
Admission: RE | Admit: 2024-03-08 | Discharge: 2024-03-08 | Disposition: A | Discharge status: Home / Self Care | Source: Ambulatory Visit | Attending: Hematology | Admitting: Hematology

## 2024-03-08 DIAGNOSIS — R918 Other nonspecific abnormal finding of lung field: Secondary | ICD-10-CM | POA: Diagnosis not present

## 2024-03-08 DIAGNOSIS — C182 Malignant neoplasm of ascending colon: Secondary | ICD-10-CM | POA: Diagnosis not present

## 2024-03-08 MED ORDER — IOHEXOL 300 MG/ML  SOLN
100.0000 mL | Freq: Once | INTRAMUSCULAR | Status: AC | PRN
  Administered 2024-03-08: 100 mL via INTRAVENOUS

## 2024-03-08 MED ORDER — SODIUM CHLORIDE (PF) 0.9 % IJ SOLN
INTRAMUSCULAR | Status: AC
  Filled 2024-03-08: qty 50

## 2024-03-08 MED ORDER — IOHEXOL 9 MG/ML PO SOLN
500.0000 mL | ORAL | Status: AC
  Administered 2024-03-08 (×2): 500 mL via ORAL

## 2024-03-08 MED ORDER — HEPARIN SOD (PORK) LOCK FLUSH 100 UNIT/ML IV SOLN
500.0000 [IU] | Freq: Once | INTRAVENOUS | Status: AC
  Administered 2024-03-08: 500 [IU] via INTRAVENOUS

## 2024-03-11 ENCOUNTER — Ambulatory Visit (HOSPITAL_COMMUNITY)

## 2024-03-11 ENCOUNTER — Inpatient Hospital Stay: Admitting: Hematology

## 2024-03-11 ENCOUNTER — Inpatient Hospital Stay

## 2024-03-11 ENCOUNTER — Inpatient Hospital Stay: Attending: Hematology

## 2024-03-11 VITALS — BP 128/58 | HR 65 | Temp 98.0°F | Resp 18 | Wt 218.3 lb

## 2024-03-11 DIAGNOSIS — R600 Localized edema: Secondary | ICD-10-CM | POA: Insufficient documentation

## 2024-03-11 DIAGNOSIS — I10 Essential (primary) hypertension: Secondary | ICD-10-CM | POA: Diagnosis not present

## 2024-03-11 DIAGNOSIS — Z8049 Family history of malignant neoplasm of other genital organs: Secondary | ICD-10-CM | POA: Diagnosis not present

## 2024-03-11 DIAGNOSIS — C182 Malignant neoplasm of ascending colon: Secondary | ICD-10-CM | POA: Insufficient documentation

## 2024-03-11 DIAGNOSIS — G629 Polyneuropathy, unspecified: Secondary | ICD-10-CM | POA: Diagnosis not present

## 2024-03-11 DIAGNOSIS — Z923 Personal history of irradiation: Secondary | ICD-10-CM | POA: Diagnosis not present

## 2024-03-11 DIAGNOSIS — Z800 Family history of malignant neoplasm of digestive organs: Secondary | ICD-10-CM | POA: Insufficient documentation

## 2024-03-11 DIAGNOSIS — C786 Secondary malignant neoplasm of retroperitoneum and peritoneum: Secondary | ICD-10-CM | POA: Insufficient documentation

## 2024-03-11 DIAGNOSIS — C7800 Secondary malignant neoplasm of unspecified lung: Secondary | ICD-10-CM | POA: Diagnosis not present

## 2024-03-11 DIAGNOSIS — T451X5A Adverse effect of antineoplastic and immunosuppressive drugs, initial encounter: Secondary | ICD-10-CM | POA: Insufficient documentation

## 2024-03-11 DIAGNOSIS — C787 Secondary malignant neoplasm of liver and intrahepatic bile duct: Secondary | ICD-10-CM | POA: Diagnosis not present

## 2024-03-11 DIAGNOSIS — Z86711 Personal history of pulmonary embolism: Secondary | ICD-10-CM | POA: Insufficient documentation

## 2024-03-11 DIAGNOSIS — C7963 Secondary malignant neoplasm of bilateral ovaries: Secondary | ICD-10-CM | POA: Diagnosis not present

## 2024-03-11 DIAGNOSIS — Z803 Family history of malignant neoplasm of breast: Secondary | ICD-10-CM | POA: Diagnosis not present

## 2024-03-11 DIAGNOSIS — Z5111 Encounter for antineoplastic chemotherapy: Secondary | ICD-10-CM | POA: Insufficient documentation

## 2024-03-11 DIAGNOSIS — Z7901 Long term (current) use of anticoagulants: Secondary | ICD-10-CM | POA: Insufficient documentation

## 2024-03-11 DIAGNOSIS — R110 Nausea: Secondary | ICD-10-CM | POA: Diagnosis not present

## 2024-03-11 LAB — CBC WITH DIFFERENTIAL (CANCER CENTER ONLY)
Abs Immature Granulocytes: 0.01 K/uL (ref 0.00–0.07)
Basophils Absolute: 0 K/uL (ref 0.0–0.1)
Basophils Relative: 1 %
Eosinophils Absolute: 0.2 K/uL (ref 0.0–0.5)
Eosinophils Relative: 3 %
HCT: 32.7 % — ABNORMAL LOW (ref 36.0–46.0)
Hemoglobin: 11 g/dL — ABNORMAL LOW (ref 12.0–15.0)
Immature Granulocytes: 0 %
Lymphocytes Relative: 42 %
Lymphs Abs: 2.8 K/uL (ref 0.7–4.0)
MCH: 36.8 pg — ABNORMAL HIGH (ref 26.0–34.0)
MCHC: 33.6 g/dL (ref 30.0–36.0)
MCV: 109.4 fL — ABNORMAL HIGH (ref 80.0–100.0)
Monocytes Absolute: 0.6 K/uL (ref 0.1–1.0)
Monocytes Relative: 9 %
Neutro Abs: 3 K/uL (ref 1.7–7.7)
Neutrophils Relative %: 45 %
Platelet Count: 223 K/uL (ref 150–400)
RBC: 2.99 MIL/uL — ABNORMAL LOW (ref 3.87–5.11)
RDW: 14.2 % (ref 11.5–15.5)
WBC Count: 6.6 K/uL (ref 4.0–10.5)
nRBC: 0 % (ref 0.0–0.2)

## 2024-03-11 LAB — CMP (CANCER CENTER ONLY)
ALT: 11 U/L (ref 0–44)
AST: 21 U/L (ref 15–41)
Albumin: 3.9 g/dL (ref 3.5–5.0)
Alkaline Phosphatase: 64 U/L (ref 38–126)
Anion gap: 4 — ABNORMAL LOW (ref 5–15)
BUN: 19 mg/dL (ref 8–23)
CO2: 24 mmol/L (ref 22–32)
Calcium: 9.3 mg/dL (ref 8.9–10.3)
Chloride: 113 mmol/L — ABNORMAL HIGH (ref 98–111)
Creatinine: 0.72 mg/dL (ref 0.44–1.00)
GFR, Estimated: >60 mL/min (ref >60)
Glucose, Bld: 104 mg/dL — ABNORMAL HIGH (ref 70–99)
Potassium: 4.4 mmol/L (ref 3.5–5.1)
Sodium: 141 mmol/L (ref 135–145)
Total Bilirubin: 1.1 mg/dL (ref 0.0–1.2)
Total Protein: 6.5 g/dL (ref 6.5–8.1)

## 2024-03-11 LAB — TOTAL PROTEIN, URINE DIPSTICK: Protein, ur: NEGATIVE mg/dL

## 2024-03-11 MED ORDER — PROCHLORPERAZINE MALEATE 10 MG PO TABS
10.0000 mg | ORAL_TABLET | Freq: Once | ORAL | Status: AC
  Administered 2024-03-11: 10 mg via ORAL
  Filled 2024-03-11: qty 1

## 2024-03-11 MED ORDER — SODIUM CHLORIDE 0.9 % IV SOLN: INTRAVENOUS | Status: DC

## 2024-03-11 MED ORDER — SODIUM CHLORIDE 0.9 % IV SOLN
400.0000 mg/m2 | Freq: Once | INTRAVENOUS | Status: AC
  Administered 2024-03-11: 856 mg via INTRAVENOUS
  Filled 2024-03-11: qty 42.8

## 2024-03-11 MED ORDER — SODIUM CHLORIDE 0.9 % IV SOLN
5.0000 mg/kg | Freq: Once | INTRAVENOUS | Status: AC
  Administered 2024-03-11: 500 mg via INTRAVENOUS
  Filled 2024-03-11: qty 4

## 2024-03-11 MED ORDER — SODIUM CHLORIDE 0.9 % IV SOLN
2400.0000 mg/m2 | INTRAVENOUS | Status: DC
  Administered 2024-03-11: 5000 mg via INTRAVENOUS
  Filled 2024-03-11: qty 100

## 2024-03-11 NOTE — Patient Instructions (Signed)
 [facility] - A DEPT OF [place] [place] [facility]  Discharge Instructions: Thank you for choosing [facility] to provide your oncology and hematology care.   If you have a lab appointment with the Cancer Center, please go directly to the Cancer Center and check in at the registration area.   Wear comfortable clothing and clothing appropriate for easy access to any Portacath or PICC line.   We strive to give you quality time with your provider. You may need to reschedule your appointment if you arrive late (15 or more minutes).  Arriving late affects you and other patients whose appointments are after yours.  Also, if you miss three or more appointments without notifying the office, you may be dismissed from the clinic at the provider's discretion.      For prescription refill requests, have your pharmacy contact our office and allow 72 hours for refills to be completed.    Today you received the following chemotherapy and/or immunotherapy agents: Bevacizumab , Leucovorin , Fluorouracil      To help prevent nausea and vomiting after your treatment, we encourage you to take your nausea medication as directed.  BELOW ARE SYMPTOMS THAT SHOULD BE REPORTED IMMEDIATELY: *FEVER GREATER THAN 100.4 F (38 C) OR HIGHER *CHILLS OR SWEATING *NAUSEA AND VOMITING THAT IS NOT CONTROLLED WITH YOUR NAUSEA MEDICATION *UNUSUAL SHORTNESS OF BREATH *UNUSUAL BRUISING OR BLEEDING *URINARY PROBLEMS (pain or burning when urinating, or frequent urination) *BOWEL PROBLEMS (unusual diarrhea, constipation, pain near the anus) TENDERNESS IN MOUTH AND THROAT WITH OR WITHOUT PRESENCE OF ULCERS (sore throat, sores in mouth, or a toothache) UNUSUAL RASH, SWELLING OR PAIN  UNUSUAL VAGINAL DISCHARGE OR ITCHING   Items with * indicate a potential emergency and should be followed up as soon as possible or go to the Emergency Department if any problems should occur.  Please show the CHEMOTHERAPY  ALERT CARD or IMMUNOTHERAPY ALERT CARD at check-in to the Emergency Department and triage nurse.  Should you have questions after your visit or need to cancel or reschedule your appointment, please contact [facility]  Dept: [phone]  and follow the prompts.  Office hours are 8:00 a.m. to 4:30 p.m. Monday - Friday. Please note that voicemails left after 4:00 p.m. may not be returned until the following business day.  We are closed weekends and major holidays. You have access to a nurse at all times for urgent questions. Please call the main number to the clinic Dept: [phone] and follow the prompts.   For any non-urgent questions, you may also contact your provider using MyChart. We now offer e-Visits for anyone 40 and older to request care online for non-urgent symptoms. For details visit [email or web address].   Also download the MyChart app! Go to the app store, search MyChart, open the app, select , and log in with your MyChart username and password.

## 2024-03-11 NOTE — Progress Notes (Signed)
 [facility]   [phone] Fax:([phone]   Clinic Follow up Note   Patient Care Team: [name], MD as PCP - General (Family Medicine) [name], MD as Consulting Physician (General Surgery) [name], MD as Consulting Physician (Hematology) [name], MD as Consulting Physician (General Surgery) [name], MD as Consulting Physician (Hematology and Oncology) [facility], [facility]   Date of Service:  03/11/2024  CHIEF COMPLAINT: f/u of metastatic colon cancer  CURRENT THERAPY:  Chemotherapy LV/5FU and bevacizumab  every 2 weeks  Oncology History   Malignant neoplasm of ascending colon (HCC) pT3N2aM0 stage IIB, MSS, metastatic to b/l ovaries and peritoneum and liver, KRAS mutation G12V (+)  -Initially diagnosed in 08/2019, s/p resection with Dr Debby on 11/01/19. Path showed overall Stage IIIB cancer.  -s/p 6 months adjuvant FOLFOX, completed 06/01/20, oxaliplatin  held for final 2 cycles due to neuropathy.  -s/p BSO and hysterectomy on 12/22/21 with Dr. Viktoria. Path revealed metastatic moderately differentiated colonic adenocarcinoma involving both ovaries and peritoneum -she restarted FOLFOX on 01/24/22. -Due to disease progression, chemotherapy changed to second line FOLFIRI and bevacizumab  on April 06, 2022. She has been tolerating moderately well, better now with dose reduction, will continue -Restaging CT scan from 09/27/2022 showed continous response in liver and peritoneal metastasis, no other new lesions.   -We discussed maintenance therapy option in future  -due to her PE in 10/2022, will hold beva for 3 months  -restaging CT 12/31/2022 showed stable disease and live met is not well defined on CT. She restarted beva on 01/17/23  -restaging CT 06/19/2023 showed overall stable disease (stable small lung nodules, no other evidence of cancer) Restaging CT 09/12/2023 showed no residual radiographic evidence of  disease.   - We discussed option of maintenance therapy, treatment changed to capecitabine  and bevacizumab  on 10/02/2023 -CT 12/18/23 showed stable disease  Assessment & Plan Metastatic colon cancer (ascending colon) to lung, liver, and peritoneum Metastatic colon cancer with stable disease. Small lung nodule unchanged at 4 mm. Liver and peritoneal lesions not visible on CT scan, indicating well-controlled disease. Fatigue is getting worse lately, and patient reports tiring out quicker. No new treatment-related symptoms reported. - Continue maintenance therapy with fibroblast pump infusion, leucovorin , and bevacizumab . - Monitor urine protein every other treatment due to potential side effects of bevacizumab , including proteinuria, hypertension, bleeding, and thromboembolism. - Scheduled next treatment for the week of Christmas, with pump infusion before Christmas. - Scheduled subsequent treatments for December 8th and 22nd.  Chemotherapy maintenance and monitoring for metastatic colon cancer Transitioning from Xeloda  to 5-fu pump infusion due to better tolerance. Blood counts and organ function are stable, allowing continuation of chemotherapy. - Discontinued Xeloda  and instructed patient not to refill. - Continue monitoring blood counts and organ function.  History of PE on anticoagulation Venous thromboembolism managed with Eliquis . No recent bleeding events reported. Bevacizumab  reintroduced after a period of holding due to previous thromboembolic events. - Continue Eliquis  for anticoagulation. - Monitor for signs of bleeding or thromboembolic events.  Plan - She is clinically stable.  Restaging CT scan shows no radiographic evidence of disease - Will change Xeloda  to 5-FU leucovorin  infusion, and continue bevacizumab  - Follow-up in 2 weeks   SUMMARY OF ONCOLOGIC HISTORY: Oncology History Overview Note   Cancer Staging  Malignant neoplasm of female breast [facility]) Staging form: Breast,  AJCC 7th Edition - Clinical stage from 06/11/2014: Stage 0 (Tis (DCIS), N0, M0) - Unsigned Staged by: Pathologist and managing  physician Laterality: Left Estrogen receptor status: Positive Progesterone receptor status: Positive Stage used in treatment planning: Yes National guidelines used in treatment planning: Yes Type of national guideline used in treatment planning: NCCN - Pathologic stage from 07/03/2014: Stage Unknown (Tis (DCIS), NX, cM0) - Signed by Guinevere Chew, MD on 07/10/2014 Staged by: Pathologist Laterality: Left Estrogen receptor status: Positive Progesterone receptor status: Positive Stage used in treatment planning: Yes National guidelines used in treatment planning: Yes Type of national guideline used in treatment planning: NCCN Staging comments: Staged on final lumpectomy specimen by Dr. Loral.  Right colon cancer Staging form: Colon and Rectum, AJCC 8th Edition - Pathologic stage from 11/01/2019: Stage IIIB (pT3, pN2a, cM0) - Signed by [name], MD on 11/29/2019 Stage prefix: Initial diagnosis Histologic grading system: 4 grade system Histologic grade (G): G2 Residual tumor (R): R0 - None Tumor deposits (TD): Absent Perineural invasion (PNI): Absent Microsatellite instability (MSI): Stable KRAS mutation: Unknown NRAS mutation: Unknown BRAF mutation: Unknown     Malignant neoplasm of female breast (HCC)  05/29/2014 Initial Biopsy   Left breast needle core biopsy: Grade 2, DCIS with calcs. ER+ (100%), PR+ (96%).    06/04/2014 Initial Diagnosis   Left breast DCIS with calcifications, ER 100%, PR 96%   06/10/2014 Breast MRI   Left breast: 2.4 x 1.3 x 1.1 cm area of patchy non-mass enhancement upper outer quadrant includes postbiopsy seroma; Right breast: 1.2 cm previously biopsied stable benign fibroadenoma   06/12/2014 Procedure   Genetic counseling/testing: Identified 1 VUS on CHEK2 gene. Remainder of 17 gene panel tested negative and included: ATM, BARD1, BRCA 1/2,  BRIP1, CDH1, CHEK2, EPCAM, MLH1, MSH2, MSH6, NBN, NF1, PALB2, PTEN, RAD50, RAD51C, RAD51D, STK11, and TP53.    07/01/2014 Surgery   Left breast lumpectomy ([facility]): Grade 1, DCIS, spanning 2.3 cm, 1 mm margin, ER 100%, PR 96%   07/31/2014 - 08/28/2014 Radiation Therapy   Adjuvant RT completed Signe). Left breast: Total dose 42.5 Gy over 17 fractions. Left breast boost: Total dose 7.5 Gy over 3 fractions.    09/14/2014 - 01/13/2020 Anti-estrogen oral therapy   Anastrazole 1mg  daily. Planned duration of treatment: 5 years ([place]). Completed in 01/2020.    09/25/2014 Survivorship   Survivorship Care Plan given to patient and reviewed with her in person.    03/02/2021 Imaging   CT CAP  IMPRESSION: 1. No findings of active/recurrent malignancy. Partial right hemicolectomy. 2. Endometrial stripe remains mildly thickened, but endometrial biopsy in May was negative for malignancy. 3. Progressive endplate sclerosis and endplate irregularity at T2-3, probably due to degenerative endplate findings. If the has referable upper thoracic pain/symptoms then thoracic spine MRI could be used for further workup. 4. Other imaging findings of potential clinical significance: Mild cardiomegaly. Aortic Atherosclerosis (ICD10-I70.0). Mild mitral valve calcification. Postoperative findings in the left breast with adjacent radiation port anteriorly in the left upper lobe. Tiny pulmonary nodules in the left lower lobe are unchanged from earliest available comparison of 10/17/2019 and probably benign although may merit surveillance. Multilevel lumbar impingement. Mild pelvic floor laxity.   Malignant neoplasm of ascending colon (HCC)  09/19/2019 Imaging   CT AP W contrast 09/19/19  IMPRESSION Fullness in the cecum, cannot exclude a mass. No evidence for metastatic disease is identified.    09/24/2019 Procedure   Colonoscopy by Dr Ernesto 09/24/19 IMPRESSION 1. The colon was redundant  2. Mild diverticulosis  was noted through the entire examined colon 3. Single 12mm polyp was found in the ascending colon; polypectomy  was performed using snare cautery and biopsy forceps 4. Mild diverticulosis was notes in the descending colon and sigmoid colon.  5. Single polyp was found in the sigmoid colon, polypectomy was performed with cold forceps.  6. Single polyp was found in the rectosigmoid colon; polypectomy was performed with cold snare  7. Small internal hemorrhoids  8. Large mass was found at the cecum; multiple biopsies of the area were performed using cold forceps; injection (tattooing) was performed distal to the mass.    09/24/2019 Initial Biopsy   INTERPRETATION AND DIAGNOSIS:  A. Cecum, biopsy:  Invasive moderately differentiated adenocarcinoma.  see comment  B. Polyp @ ascending colon, polypectomy:  Tubular Adenoma  C. Polyp @ sigmoid colon Polypectomy:  hyperplastic polyp.  D. Polyp @ rectosigmoid colon, Polypectomy:  Hyperplastic Polyp      10/16/2019 Imaging   CT Chest IMPRESSION: 1. Multiple small pulmonary nodules measuring 5 mm or less in size in the lungs. These are nonspecific and are typically considered statistically likely benign. However, given the patient's history of primary malignancy, close attention on follow-up studies is recommended to ensure stability. 2. Aortic atherosclerosis, in addition to right coronary artery disease. Assessment for potential risk factor modification, dietary therapy or pharmacologic therapy may be warranted, if clinically indicated. 3. There are calcifications of the aortic valve and mitral annulus. Echocardiographic correlation for evaluation of potential valvular dysfunction may be warranted if clinically indicated. 4. Small hiatal hernia.   Aortic Atherosclerosis (ICD10-I70.0).   11/01/2019 Initial Diagnosis   Colon cancer (HCC)   11/01/2019 Surgery   LAPAROSCOPIC PARTIAL COLECTOMY by Dr Debby and Dr Sheldon   11/01/2019 Pathology  Results   FINAL MICROSCOPIC DIAGNOSIS:   A. COLON, PROXIMAL RIGHT, COLECTOMY:  - Invasive colonic adenocarcinoma, 5 cm.  - Tumor invades through the muscularis propria into pericolonic tissues.   - Margins of resection are not involved.  - Metastatic carcinoma in (5) of (13) lymph nodes.  - See oncology table.    MSI Stable  Mismatch repair normal  MLH1 - Preserved nuclear expression (greater 50% tumor expression) MSH2 - Preserved nuclear expression (greater 50% tumor expression) MSH6 - Preserved nuclear expression (greater 50% tumor expression) PMS2 - Preserved nuclear expression (greater 50% tumor expression)   11/01/2019 Cancer Staging   Staging form: Colon and Rectum, AJCC 8th Edition - Pathologic stage from 11/01/2019: Stage IIIB (pT3, pN2a, cM0) - Signed by [name], MD on 11/29/2019   12/10/2019 Procedure   PAC placed 12/10/19   12/17/2019 - 06/01/2020 Chemotherapy   FOLFOX q2weeks starting in 2 weeks starting 12/17/19. Held 01/27/20-02/10/20 due to b/l PE. Oxaliplatin  held C11-12 due to neuropathy. Completed on 06/01/20   03/02/2021 Imaging   CT CAP  IMPRESSION: 1. No findings of active/recurrent malignancy. Partial right hemicolectomy. 2. Endometrial stripe remains mildly thickened, but endometrial biopsy in May was negative for malignancy. 3. Progressive endplate sclerosis and endplate irregularity at T2-3, probably due to degenerative endplate findings. If the has referable upper thoracic pain/symptoms then thoracic spine MRI could be used for further workup. 4. Other imaging findings of potential clinical significance: Mild cardiomegaly. Aortic Atherosclerosis (ICD10-I70.0). Mild mitral valve calcification. Postoperative findings in the left breast with adjacent radiation port anteriorly in the left upper lobe. Tiny pulmonary nodules in the left lower lobe are unchanged from earliest available comparison of 10/17/2019 and probably benign although may merit surveillance.  Multilevel lumbar impingement. Mild pelvic floor laxity.   08/26/2021 Imaging   EXAM: CT CHEST, ABDOMEN, AND PELVIS WITH  CONTRAST  IMPRESSION: 1. Stable examination without new or progressive findings to suggest local recurrence or metastatic disease within the chest, abdomen, or pelvis. 2. Hepatomegaly with hepatic steatosis. 3. Sigmoid colonic diverticulosis without findings of acute diverticulitis. 4. Similar prominent endplate sclerosis and irregularity at T2-T3 is most consistent with Modic type endplate degenerative changes. However, if patient has referable upper thoracic pain consider further workup with thoracic spine MRI. 5. Similar thickening of the endometrial stripe measuring up to 8 mm, which was previously biopsied with results negative for malignancy. 6. Aortic Atherosclerosis (ICD10-I70.0).   11/10/2021 PET scan   IMPRESSION: 1. LEFT ovary is increased in size and is intensely hypermetabolic. While physiologic hypermetabolic ovarian tissue is not uncommon, the enlargement and asymmetric activity warrants further evaluation. Consider contrast pelvic MRI vs tissue sampling. 2. No evidence of metastatic colorectal carcinoma otherwise. 3. Post RIGHT hemicolectomy anatomy. 4. Evidence of radiation change in the LEFT upper lobe (remote breast cancer).     12/22/2021 Relapse/Recurrence    FINAL MICROSCOPIC DIAGNOSIS:   A. LEFT OVARY AND FALLOPIAN TUBE, SALPINGO OOPHORECTOMY:  - Metastatic moderately differentiated colonic adenocarcinoma involving left ovary  - Focal ovarian stromal calcification  - Segment of benign fallopian tube   B. UTERUS WITH RIGHT FALLOPIAN TUBE AND OVARY, HYSTERECTOMY AND SALPINGO-OOPHORECTOMY:  - Metastatic moderately differentiated colonic adenocarcinoma involving right ovary  - Focal invasive extensive adenomyosis  - Benign endometrial polyps  - Benign proliferative phase endometrium  - Hydrosalpinx of right fallopian tube  - Focal ovarian  stromal calcification   C. PERITONEAL DEPOSITS, ANTERIOR CUL DE SAC, BIOPSY:  - Metastatic mucinous adenocarcinoma, consistent with colorectal primary    COMMENT:  Immunohistochemical stains show that the tumor cells are positive for CK20 and CDX2 while they are negative for CK7 and PAX8, consistent with above interpretation.    01/24/2022 - 03/23/2022 Chemotherapy   Patient is on Treatment Plan : COLORECTAL FOLFOX q14d x 3 months     04/01/2022 Imaging   CT AP IMPRESSION: 1. New omental metastatic disease. 2. Vague hypoattenuating lesion in the inferior right hepatic lobe is new and also worrisome for metastatic disease. 3. Similar small right lower lobe nodules. Recommend continued attention on follow-up. 4. Steatotic enlarged liver. 5.  Aortic atherosclerosis (ICD10-I70.0).   04/06/2022 - 09/18/2023 Chemotherapy   Patient is on Treatment Plan : COLORECTAL FOLFIRI + Bevacizumab  q14d     04/21/2022 Imaging    IMPRESSION: 1. Stable chest CT. No evidence of metastatic disease. 2. Stable small pulmonary nodules, considered benign based on stability. 3. Stable postsurgical changes in the left breast and anterior left upper lobe radiation changes. 4. Aortic Atherosclerosis (ICD10-I70.0) and Emphysema (ICD10-J43.9).   04/21/2022 Imaging    IMPRESSION: 1. Small enhancing lesion inferiorly in the right hepatic lobe is typical of metastatic disease and stable from recent abdominal CT. No other definite liver lesions are identified. Mild hepatic steatosis. 2. No other significant abdominal findings. 3. Omental nodularity seen on CT is not well visualized by MRI.    Imaging     06/23/2022 Imaging    IMPRESSION: 1. Hypodense lesion of the inferior right lobe of the liver, hepatic segment VI is diminished in size, consistent with treatment response. 2. Tiny peritoneal and omental nodules identified by prior examination are diminished in size, consistent with  treatment response. 3. Multiple tiny pulmonary nodules unchanged, most likely benign and incidental, however continued attention on follow-up warranted in the setting of known metastatic disease. 4. No evidence of new metastatic  disease in the chest, abdomen, or pelvis. 5. Status post partial right hemicolectomy and reanastomosis. 6. Diffuse mosaic attenuation of the airspaces, consistent with small airways disease. 7. Hepatomegaly.   10/02/2023 - 02/26/2024 Chemotherapy   Patient is on Treatment Plan : COLORECTAL Bevacizumab  q21d     12/18/2023 Imaging   CT CAP with contrast IMPRESSION: 1. Prior partial right hemicolectomy with ileocolic anastomosis. No suspicious nodularity along the suture line. Prominent adjacent lymph nodes are similar to prior. 2. Stable tiny scattered pulmonary nodules. 3. No evidence of new or progressive disease in the chest, abdomen or pelvis. 4. Hepatomegaly with hepatic steatosis. 5. Aortic atherosclerosis.     03/11/2024 -  Chemotherapy   Patient is on Treatment Plan : COLORECTAL 5FU + Leucovorin  (Modified [place]ont) + Bevacizumab  q14d        Discussed the use of AI scribe software for clinical note transcription with the patient, who gave verbal consent to proceed.  History of Present Illness Kim Castro is a 78 year old female with colon cancer who presents for follow-up.  She is undergoing treatment with bevacizumab , leucovorin , and a fibroblast pump infusion. She experiences cold hands but no other significant side effects. Blood work shows stable counts with normal white count, hemoglobin at 11, and normal platelet count. Kidney and liver functions are normal, and urine protein is negative. A recent CT scan shows a small nodule in the lung and previously noted spots in the liver and peritoneum.  She experiences increased fatigue, tiring quickly but able to rest for 20-30 minutes before resuming activities. She is on Eliquis  due to blood  clots, with no bleeding issues reported.     All other systems were reviewed with the patient and are negative.  MEDICAL HISTORY:  Past Medical History:  Diagnosis Date   Aortic atherosclerosis    Arthritis    feet, lower back   Basal cell carcinoma    arm   BRCA gene mutation negative    Breast cancer of upper-outer quadrant of left female breast (HCC) 06/04/2014   Cataract    immature on the left   Colon cancer (HCC) 08/2019   Diabetes mellitus without complication (HCC)    Diverticulosis    Dizziness    > 50yrs ago;took Antivert    Family history of anesthesia complication    sister slow to wake up with anesthesia   Family history of breast cancer    Family history of colon cancer    Family history of uterine cancer    GERD (gastroesophageal reflux disease)    takes occasional TUMs   History of bronchitis    > 31yrs ago   History of colon polyps    History of hiatal hernia    Small noted on CT   History of pulmonary embolus (PE)    Hypertension    takes Losartan  daily and HCTZ   Iron deficiency anemia    Joint pain    Metastatic disease (HCC) 2023   peritoneum and liver mets   Numbness    to toes on each foot   Peripheral neuropathy    feet and toes   Personal history of radiation therapy    Pulmonary nodules    Noted on CT   Radiation 07/31/14-08/28/14   Left Breast 20 fxs   Seasonal allergies    takes Claritin prn   Urinary frequency    Vitamin D deficiency    takes VIt D daily    SURGICAL HISTORY: Past Surgical  History:  Procedure Laterality Date   BREAST BIOPSY Bilateral    BREAST LUMPECTOMY Left    BREAST LUMPECTOMY WITH RADIOACTIVE SEED LOCALIZATION Left 07/01/2014   Procedure: LEFT BREAST LUMPECTOMY WITH RADIOACTIVE SEED LOCALIZATION;  Surgeon: Morene Olives, MD;  Location: [facility];  Service: General;  Laterality: Left;   CATARACT EXTRACTION Right    COLONOSCOPY  09/24/2019   [place]   LAPAROSCOPIC PARTIAL COLECTOMY N/A  11/01/2019   Procedure: LAPAROSCOPIC PARTIAL COLECTOMY;  Surgeon: [name], MD;  Location: [facility];  Service: General;  Laterality: N/A;   POLYPECTOMY     PORTACATH PLACEMENT N/A 12/10/2019   Procedure: INSERTION PORT-A-CATH ULTRASOUND GUIDED IN RIGHT IJ;  Surgeon: [name], MD;  Location: [facility];  Service: General;  Laterality: N/A;   ROBOTIC ASSISTED TOTAL HYSTERECTOMY Bilateral 12/22/2021   Procedure: XI ROBOTIC ASSISTED TOTAL HYSTERECTOMY WITH BILATERAL SALPINGO OOPHORECTOMY;  Surgeon: Viktoria Comer SAUNDERS, MD;  Location: [facility];  Service: Gynecology;  Laterality: Bilateral;   TOTAL KNEE ARTHROPLASTY Left 10/24/2012   Procedure: TOTAL KNEE ARTHROPLASTY;  Surgeon: Dempsey JINNY Sensor, MD;  Location: [facility];  Service: Orthopedics;  Laterality: Left;   TOTAL KNEE ARTHROPLASTY Right 01/09/2013   Procedure: TOTAL KNEE ARTHROPLASTY;  Surgeon: Dempsey JINNY Sensor, MD;  Location: [facility];  Service: Orthopedics;  Laterality: Right;   TUBAL LIGATION      I have reviewed the social history and family history with the patient and they are unchanged from previous note.  ALLERGIES:  is allergic to oxycodone .  MEDICATIONS:  Current Outpatient Medications  Medication Sig Dispense Refill   b complex vitamins capsule Take 1 capsule by mouth daily.     capecitabine  (XELODA ) 500 MG tablet Take 3 tabs in morning and 4 tabs in evening. Take within 30 minutes after meals. Take for 7 days on and 7 days off, then repeat. (Patient not taking: Reported on 03/11/2024) 98 tablet 1   Cholecalciferol (VITAMIN D) 2000 UNITS CAPS Take 2,000 Units by mouth daily.      ELIQUIS  5 MG TABS tablet Take 1 tablet by mouth twice daily 60 tablet 0   famotidine (PEPCID) 20 MG tablet Take 20 mg by mouth daily.     ibuprofen (ADVIL) 200 MG tablet Take 200 mg by mouth daily as needed (pain).     lidocaine -prilocaine  (EMLA ) cream Apply 1 Application topically as needed. 30 g 1   lidocaine -prilocaine  (EMLA ) cream Apply to affected area  once 30 g 3   loratadine (CLARITIN) 10 MG tablet Take 10 mg by mouth daily as needed for allergies.     losartan  (COZAAR ) 25 MG tablet Take 1 tablet by mouth once daily 90 tablet 2   magic mouthwash (nystatin , lidocaine , diphenhydrAMINE , alum & mag hydroxide) suspension Swish and swallow 4 times daily as needed for mouth pain. 140 mL 1   metFORMIN  (GLUCOPHAGE ) 500 MG tablet TAKE 1 TABLET BY MOUTH IN THE MORNING AND AT BEDTIME 180 tablet 3   Multiple Vitamins-Minerals (MULTIVITAMIN WITH MINERALS) tablet Take 1 tablet by mouth daily.     ondansetron  (ZOFRAN ) 8 MG tablet Take 1 tablet (8 mg total) by mouth every 8 (eight) hours as needed for nausea or vomiting. Start on the third day after chemotherapy. 30 tablet 1   ondansetron  (ZOFRAN ) 8 MG tablet Take 1 tablet (8 mg total) by mouth every 8 (eight) hours as needed for nausea or vomiting. 30 tablet 1   potassium chloride  (KLOR-CON ) 10 MEQ tablet Take 1 tablet by  mouth twice daily 60 tablet 0   pravastatin  (PRAVACHOL ) 10 MG tablet Take 1 tablet by mouth once daily 90 tablet 3   pregabalin  (LYRICA ) 25 MG capsule TAKE 1 CAPSULE BY MOUTH ONCE DAILY IN ADDITION TO 50 MG  AT BEDTIME 30 capsule 1   pregabalin  (LYRICA ) 50 MG capsule TAKE 1 CAPSULE BY MOUTH TWICE DAILY IN ADDITION TO THE 25 MG CAPSULE AT BEDTIME FOR A TOTAL OF 75 MG AT BEDTIME. 60 capsule 0   prochlorperazine  (COMPAZINE ) 10 MG tablet Take 1 tablet (10 mg total) by mouth every 6 (six) hours as needed for nausea or vomiting. 30 tablet 1   prochlorperazine  (COMPAZINE ) 10 MG tablet Take 1 tablet (10 mg total) by mouth every 6 (six) hours as needed for nausea or vomiting. 30 tablet 1   traMADol  (ULTRAM ) 50 MG tablet Take 1 tablet (50 mg total) by mouth every 6 (six) hours as needed. 30 tablet 0   triamcinolone  ointment (KENALOG ) 0.5 % Apply 1 Application topically 2 (two) times daily. 30 g 0   No current facility-administered medications for this visit.   Facility-Administered Medications  Ordered in Other Visits  Medication Dose Route Frequency Provider Last Rate Last Admin   0.9 %  sodium chloride  infusion   Intravenous Continuous [name], MD   Stopped at 03/11/24 1543   fluorouracil  (ADRUCIL ) 5,000 mg in sodium chloride  0.9 % 150 mL chemo infusion  2,400 mg/m2 (Treatment Plan Recorded) Intravenous 1 day or 1 dose [name], MD   Infusion Verify at 03/11/24 1617    PHYSICAL EXAMINATION: ECOG PERFORMANCE STATUS: 1 - Symptomatic but completely ambulatory  Vitals:   03/11/24 1308 03/11/24 1319  BP: (!) 142/73 (!) 128/58  Pulse: 65   Resp: 18   Temp: 98 F (36.7 C)   SpO2: 100%    Wt Readings from Last 3 Encounters:  03/11/24 218 lb 4.8 oz (99 kg)  02/26/24 218 lb 14.4 oz (99.3 kg)  02/05/24 210 lb 11.2 oz (95.6 kg)     GENERAL:alert, no distress and comfortable SKIN: skin color, texture, turgor are normal, no rashes or significant lesions EYES: normal, Conjunctiva are pink and non-injected, sclera clear NECK: supple, thyroid normal size, non-tender, without nodularity LYMPH:  no palpable lymphadenopathy in the cervical, axillary  LUNGS: clear to auscultation and percussion with normal breathing effort HEART: regular rate & rhythm and no murmurs and no lower extremity edema ABDOMEN:abdomen soft, non-tender and normal bowel sounds Musculoskeletal:no cyanosis of digits and no clubbing  NEURO: alert & oriented x 3 with fluent speech, no focal motor/sensory deficits  Physical Exam    LABORATORY DATA:  I have reviewed the data as listed    Latest Ref Rng & Units 03/11/2024   12:31 PM 02/26/2024    9:12 AM 02/14/2024   10:14 AM  CBC  WBC 4.0 - 10.5 K/uL 6.6  5.1  6.5   Hemoglobin 12.0 - 15.0 g/dL 88.9  88.5  88.0   Hematocrit 36.0 - 46.0 % 32.7  34.0  34.9   Platelets 150 - 400 K/uL 223  135  178         Latest Ref Rng & Units 03/11/2024   12:31 PM 02/26/2024    9:12 AM 02/14/2024   10:14 AM  CMP  Glucose 70 - 99 mg/dL 895  895  894   BUN 8 - 23  mg/dL 19  25  23    Creatinine 0.44 - 1.00 mg/dL 9.27  9.22  9.26  Sodium 135 - 145 mmol/L 141  142  141   Potassium 3.5 - 5.1 mmol/L 4.4  5.2  4.7   Chloride 98 - 111 mmol/L 113  111  109   CO2 22 - 32 mmol/L 24  28  27    Calcium  8.9 - 10.3 mg/dL 9.3  9.6  89.8   Total Protein 6.5 - 8.1 g/dL 6.5  6.7  6.7   Total Bilirubin 0.0 - 1.2 mg/dL 1.1  1.6  1.8   Alkaline Phos 38 - 126 U/L 64  87  89   AST 15 - 41 U/L 21  28  29    ALT 0 - 44 U/L 11  14  15        RADIOGRAPHIC STUDIES: I have personally reviewed the radiological images as listed and agreed with the findings in the report. No results found.    Orders Placed This Encounter  Procedures   CBC with Differential (Cancer Center Only)    Standing Status:   Future    Expected Date:   05/06/2024    Expiration Date:   05/06/2025   CMP (Cancer Center only)    Standing Status:   Future    Expected Date:   05/06/2024    Expiration Date:   05/06/2025   All questions were answered. The patient knows to call the clinic with any problems, questions or concerns. No barriers to learning was detected. The total time spent in the appointment was 25 minutes, including review of chart and various tests results, discussions about plan of care and coordination of care plan     Onita Mattock, MD 03/11/2024

## 2024-03-11 NOTE — Progress Notes (Signed)
 Called GI Reading Room to expedite the read on this pt's recent CT CAP.  Diane stated dhe will expedite the read.

## 2024-03-11 NOTE — Assessment & Plan Note (Signed)
 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 Path showed overall Stage IIIB cancer.  -s/p 6 months adjuvant FOLFOX, completed 06/01/20, oxaliplatin  held for final 2 cycles due to neuropathy.  -s/p BSO and hysterectomy on 12/22/21 with Dr. Viktoria. Path revealed metastatic moderately differentiated colonic adenocarcinoma involving both ovaries and peritoneum -she restarted FOLFOX on 01/24/22. -Due to disease progression, chemotherapy changed to second line FOLFIRI and bevacizumab  on April 06, 2022. She has been tolerating moderately well, better now with dose reduction, will continue -Restaging CT scan from 09/27/2022 showed continous response in liver and peritoneal metastasis, no other new lesions.   -We discussed maintenance therapy option in future  -due to her PE in 10/2022, will hold beva for 3 months  -restaging CT 12/31/2022 showed stable disease and live met is not well defined on CT. She restarted beva on 01/17/23  -restaging CT 06/19/2023 showed overall stable disease (stable small lung nodules, no other evidence of cancer) Restaging CT 09/12/2023 showed no residual radiographic evidence of disease.   - We discussed option of maintenance therapy, treatment changed to capecitabine  and bevacizumab  on 10/02/2023 -CT 12/18/23 showed stable disease

## 2024-03-12 ENCOUNTER — Ambulatory Visit: Payer: Self-pay | Admitting: Family Medicine

## 2024-03-12 ENCOUNTER — Ambulatory Visit: Admitting: Family Medicine

## 2024-03-12 ENCOUNTER — Encounter: Payer: Self-pay | Admitting: Family Medicine

## 2024-03-12 VITALS — BP 126/74 | HR 68 | Temp 98.2°F | Ht 65.5 in | Wt 220.6 lb

## 2024-03-12 DIAGNOSIS — C182 Malignant neoplasm of ascending colon: Secondary | ICD-10-CM

## 2024-03-12 DIAGNOSIS — E7849 Other hyperlipidemia: Secondary | ICD-10-CM

## 2024-03-12 DIAGNOSIS — I10 Essential (primary) hypertension: Secondary | ICD-10-CM | POA: Diagnosis not present

## 2024-03-12 DIAGNOSIS — Z23 Encounter for immunization: Secondary | ICD-10-CM | POA: Diagnosis not present

## 2024-03-12 DIAGNOSIS — Z7984 Long term (current) use of oral hypoglycemic drugs: Secondary | ICD-10-CM

## 2024-03-12 DIAGNOSIS — E785 Hyperlipidemia, unspecified: Secondary | ICD-10-CM | POA: Diagnosis not present

## 2024-03-12 DIAGNOSIS — E66812 Obesity, class 2: Secondary | ICD-10-CM | POA: Diagnosis not present

## 2024-03-12 DIAGNOSIS — T451X5A Adverse effect of antineoplastic and immunosuppressive drugs, initial encounter: Secondary | ICD-10-CM

## 2024-03-12 DIAGNOSIS — G620 Drug-induced polyneuropathy: Secondary | ICD-10-CM

## 2024-03-12 DIAGNOSIS — E1169 Type 2 diabetes mellitus with other specified complication: Secondary | ICD-10-CM

## 2024-03-12 DIAGNOSIS — Z6836 Body mass index (BMI) 36.0-36.9, adult: Secondary | ICD-10-CM

## 2024-03-12 DIAGNOSIS — C786 Secondary malignant neoplasm of retroperitoneum and peritoneum: Secondary | ICD-10-CM

## 2024-03-12 LAB — HEMOGLOBIN A1C: Hgb A1c MFr Bld: 5.7 % (ref 4.6–6.5)

## 2024-03-12 LAB — GLUCOSE, RANDOM: Glucose, Bld: 105 mg/dL — ABNORMAL HIGH (ref 70–99)

## 2024-03-12 NOTE — Assessment & Plan Note (Signed)
 Blood pressure is in good control. Continue losartan  25 mg daily.

## 2024-03-12 NOTE — Progress Notes (Signed)
 [facility] [address] [place] [address] Dept: [phone] Dept Fax: [phone]  Chronic Care Office Visit  Subjective:    Patient ID: Kim Castro, female    DOB: 03/12/1946, 78 y.o..   MRN: [id number]  Chief Complaint  Patient presents with   Hypertension    3 month f/u. HTN.   No concerns.     History of Present Illness:  Patient is in today for reassessment of chronic medical conditions.   Kim Castro has a history of Type 2 diabetes. She is managed on metformin  500 mg bid.    Kim Castro has a history of hypertension. She is managed on losartan  25 mg daily.   Kim Castro has a history of hyperlipidemia. She is currently on pravastatin  10 mg daily.   Kim Castro has a history of both breast and colon cancer. Earlier in 2023, Kim Castro was found to have metastatic colon cancer throughout the peritoneal cavity, esp. involving both ovaries. She underwent a total hysterectomy. She currently is on oral chemotherapy. This has been complicated with nausea, skin irritation, fatigue, peripheral neuropathy, and anemia.  She notes Dr. Lanny has switched back to infusions of chemotherapy,a s the oral chemotherapeutic seemed to be causing more side effects.  Past Medical History: Patient Active Problem List   Diagnosis Date Noted   Acute right-sided low back pain without sciatica 05/30/2023   Elevated alkaline phosphatase level 05/30/2023   Pulmonary embolism (HCC) 10/02/2022   Macrocytic anemia 10/02/2022   Leukocytosis 10/02/2022   Metastasis to peritoneal cavity (HCC) 01/12/2022   Nausea without vomiting 12/23/2021   Thickened endometrium    Essential hypertension 05/12/2021   Vitamin D deficiency 05/12/2021   Personal history of malignant neoplasm of breast 05/12/2021   History of colonic polyps 05/12/2021   Morbid obesity (HCC) 05/12/2021   Hyperlipidemia 05/12/2021   Hypercalcemia 05/12/2021   Allergic rhinitis  05/12/2021   Knee joint replacement status, bilateral 05/12/2021   Peripheral neuropathy due to chemotherapy 05/12/2021   Aortic atherosclerosis 05/12/2021   Gastroesophageal reflux disease    Type 2 diabetes mellitus with hyperlipidemia (HCC)    History of pulmonary embolism 01/27/2020   Port-A-Cath in place 12/30/2019   Malignant neoplasm of ascending colon (HCC) 11/01/2019   Genetic testing 07/08/2014   Malignant neoplasm of female breast (HCC) 06/04/2014   Past Surgical History:  Procedure Laterality Date   BREAST BIOPSY Bilateral    BREAST LUMPECTOMY Left    BREAST LUMPECTOMY WITH RADIOACTIVE SEED LOCALIZATION Left 07/01/2014   Procedure: LEFT BREAST LUMPECTOMY WITH RADIOACTIVE SEED LOCALIZATION;  Surgeon: Morene Olives, MD;  Location: [facility];  Service: General;  Laterality: Left;   CATARACT EXTRACTION Right    COLONOSCOPY  09/24/2019   [place]   LAPAROSCOPIC PARTIAL COLECTOMY N/A 11/01/2019   Procedure: LAPAROSCOPIC PARTIAL COLECTOMY;  Surgeon: [name], MD;  Location: [facility];  Service: General;  Laterality: N/A;   POLYPECTOMY     PORTACATH PLACEMENT N/A 12/10/2019   Procedure: INSERTION PORT-A-CATH ULTRASOUND GUIDED IN RIGHT IJ;  Surgeon: [name], MD;  Location: [facility];  Service: General;  Laterality: N/A;   ROBOTIC ASSISTED TOTAL HYSTERECTOMY Bilateral 12/22/2021   Procedure: XI ROBOTIC ASSISTED TOTAL HYSTERECTOMY WITH BILATERAL SALPINGO OOPHORECTOMY;  Surgeon: Viktoria Comer SAUNDERS, MD;  Location: [facility];  Service: Gynecology;  Laterality: Bilateral;   TOTAL KNEE ARTHROPLASTY Left 10/24/2012   Procedure: TOTAL KNEE ARTHROPLASTY;  Surgeon: Dempsey JINNY Sensor, MD;  Location: [facility];  Service: Orthopedics;  Laterality: Left;   TOTAL KNEE ARTHROPLASTY Right 01/09/2013   Procedure: TOTAL KNEE ARTHROPLASTY;  Surgeon: Dempsey JINNY Sensor, MD;  Location: [facility];  Service: Orthopedics;  Laterality: Right;   TUBAL LIGATION     Family History  Problem Relation Age  of Onset   Diabetes Mother    Uterine cancer Mother        deceased 36   Hypertension Father    Heart disease Father    Diabetes Sister    Breast cancer Sister 68       BRCA neg   Hypertension Sister    Diabetes Sister    Breast cancer Sister 18       BRCA neg   Diabetes Sister    Kidney disease Sister    Heart disease Sister    Lupus Sister    Breast cancer Sister 45       BRCA neg   Heart disease Sister    Diabetes Sister    Breast cancer Sister 22       BRCA neg   Diabetes Sister    Thyroid cancer Daughter 15   Breast cancer Daughter 72       BRCA neg   Colon polyps Daughter    Cancer Maternal Uncle        unk. primary; deceased 86s   Stroke Maternal Grandmother    Hypertension Brother    Diabetes Brother    Colon cancer Brother 25   Breast cancer Niece 53   Esophageal cancer Neg Hx    Rectal cancer Neg Hx    Stomach cancer Neg Hx    Outpatient Medications Prior to Visit  Medication Sig Dispense Refill   b complex vitamins capsule Take 1 capsule by mouth daily.     capecitabine  (XELODA ) 500 MG tablet Take 3 tabs in morning and 4 tabs in evening. Take within 30 minutes after meals. Take for 7 days on and 7 days off, then repeat. (Patient not taking: Reported on 03/11/2024) 98 tablet 1   Cholecalciferol (VITAMIN D) 2000 UNITS CAPS Take 2,000 Units by mouth daily.      ELIQUIS  5 MG TABS tablet Take 1 tablet by mouth twice daily 60 tablet 0   famotidine (PEPCID) 20 MG tablet Take 20 mg by mouth daily.     ibuprofen (ADVIL) 200 MG tablet Take 200 mg by mouth daily as needed (pain).     lidocaine -prilocaine  (EMLA ) cream Apply 1 Application topically as needed. 30 g 1   lidocaine -prilocaine  (EMLA ) cream Apply to affected area once 30 g 3   loratadine (CLARITIN) 10 MG tablet Take 10 mg by mouth daily as needed for allergies.     losartan  (COZAAR ) 25 MG tablet Take 1 tablet by mouth once daily 90 tablet 2   magic mouthwash (nystatin , lidocaine , diphenhydrAMINE , alum & mag  hydroxide) suspension Swish and swallow 4 times daily as needed for mouth pain. 140 mL 1   metFORMIN  (GLUCOPHAGE ) 500 MG tablet TAKE 1 TABLET BY MOUTH IN THE MORNING AND AT BEDTIME 180 tablet 3   Multiple Vitamins-Minerals (MULTIVITAMIN WITH MINERALS) tablet Take 1 tablet by mouth daily.     ondansetron  (ZOFRAN ) 8 MG tablet Take 1 tablet (8 mg total) by mouth every 8 (eight) hours as needed for nausea or vomiting. Start on the third day after chemotherapy. 30 tablet 1   ondansetron  (ZOFRAN ) 8 MG tablet Take 1 tablet (8 mg total) by mouth every 8 (eight) hours as  needed for nausea or vomiting. 30 tablet 1   potassium chloride  (KLOR-CON ) 10 MEQ tablet Take 1 tablet by mouth twice daily 60 tablet 0   pravastatin  (PRAVACHOL ) 10 MG tablet Take 1 tablet by mouth once daily 90 tablet 3   pregabalin  (LYRICA ) 25 MG capsule TAKE 1 CAPSULE BY MOUTH ONCE DAILY IN ADDITION TO 50 MG  AT BEDTIME 30 capsule 1   pregabalin  (LYRICA ) 50 MG capsule TAKE 1 CAPSULE BY MOUTH TWICE DAILY IN ADDITION TO THE 25 MG CAPSULE AT BEDTIME FOR A TOTAL OF 75 MG AT BEDTIME. 60 capsule 0   prochlorperazine  (COMPAZINE ) 10 MG tablet Take 1 tablet (10 mg total) by mouth every 6 (six) hours as needed for nausea or vomiting. 30 tablet 1   prochlorperazine  (COMPAZINE ) 10 MG tablet Take 1 tablet (10 mg total) by mouth every 6 (six) hours as needed for nausea or vomiting. 30 tablet 1   traMADol  (ULTRAM ) 50 MG tablet Take 1 tablet (50 mg total) by mouth every 6 (six) hours as needed. 30 tablet 0   triamcinolone  ointment (KENALOG ) 0.5 % Apply 1 Application topically 2 (two) times daily. 30 g 0   No facility-administered medications prior to visit.   Allergies  Allergen Reactions   Oxycodone  Nausea And Vomiting     Objective:   Today's Vitals   03/12/24 0820  BP: 126/74  Pulse: 68  Temp: 98.2 F (36.8 C)  TempSrc: Temporal  SpO2: 100%  Weight: 220 lb 9.6 oz (100.1 kg)  Height: 5' 5.5 (1.664 m)   Body mass index is 36.15  kg/m.   General: Well developed, well nourished. No acute distress. Psych: Alert and oriented. Normal mood and affect.  Health Maintenance Due  Topic Date Due   COVID-19 Vaccine (4 - 2025-26 season) 01/01/2024   Colonoscopy  02/11/2024   Imaging CT CHEST ABDOMEN PELVIS W CONTRAST Result Date: 03/11/2024 IMPRESSION:  1. No evidence of recurrent or metastatic disease in the chest, abdomen, or pelvis.  2. Scattered small pulmonary nodules are unchanged, measuring 4 mm or less.  3. Subpleural reticulation in the left upper lobe compatible with prior radiation treatment.   CT CHEST ABDOMEN PELVIS W CONTRAST Result Date: 12/22/2023 IMPRESSION:  1. Prior partial right hemicolectomy with ileocolic anastomosis. No suspicious nodularity along the suture line. Prominent adjacent lymph nodes are similar to prior.  2. Stable tiny scattered pulmonary nodules.  3. No evidence of new or progressive disease in the chest, abdomen or pelvis.  4. Hepatomegaly with hepatic steatosis.  5. Aortic atherosclerosis.   Lab Results    Latest Ref Rng & Units 03/11/2024   12:31 PM 02/26/2024    9:12 AM 02/14/2024   10:14 AM  CBC  WBC 4.0 - 10.5 K/uL 6.6  5.1  6.5   Hemoglobin 12.0 - 15.0 g/dL 88.9  88.5  88.0   Hematocrit 36.0 - 46.0 % 32.7  34.0  34.9   Platelets 150 - 400 K/uL 223  135  178       Latest Ref Rng & Units 03/11/2024   12:31 PM 02/26/2024    9:12 AM 02/14/2024   10:14 AM  CMP  Glucose 70 - 99 mg/dL 895  895  894   BUN 8 - 23 mg/dL 19  25  23    Creatinine 0.44 - 1.00 mg/dL 9.27  9.22  9.26   Sodium 135 - 145 mmol/L 141  142  141   Potassium 3.5 - 5.1 mmol/L 4.4  5.2  4.7   Chloride 98 - 111 mmol/L 113  111  109   CO2 22 - 32 mmol/L 24  28  27    Calcium  8.9 - 10.3 mg/dL 9.3  9.6  89.8   Total Protein 6.5 - 8.1 g/dL 6.5  6.7  6.7   Total Bilirubin 0.0 - 1.2 mg/dL 1.1  1.6  1.8   Alkaline Phos 38 - 126 U/L 64  87  89   AST 15 - 41 U/L 21  28  29    ALT 0 - 44 U/L 11  14  15        Assessment & Plan:   Problem List Items Addressed This Visit       Cardiovascular and Mediastinum   Essential hypertension   Blood pressure is in good control. Continue losartan  25 mg daily.        Digestive   Malignant neoplasm of ascending colon (HCC)   Continue chemotherapy and following with oncology. I reached out to Dr. Lanny to ask about whether Ms. Steenson needs a screening colonoscopy in light of her active treatment for colon cancer.        Endocrine   Type 2 diabetes mellitus with hyperlipidemia (HCC) - Primary   A1c has been meeting goals. Continue metformin  500 mg bid. I will check her A1c today.      Relevant Orders   Glucose, random   Hemoglobin A1c     Nervous and Auditory   Peripheral neuropathy due to chemotherapy   Secondary to oxaliplatin . Continue pregabalin  and B complex.  Overall stable.          Other   Class 2 obesity due to excess calories with body mass index (BMI) of 36.0 to 36.9 in adult   Ms. Koo's weight has increased a small amount over the past several months. In light of her cancer treatment, I do not feel that a primary focus on weight loss is appropriate at present.      Hyperlipidemia   LDL cholesterol at goal. Continue pravastatin  10 mg daily.      Metastasis to peritoneal cavity (HCC)   As above.      Other Visit Diagnoses       Need for immunization against influenza       Relevant Orders   Flu vaccine HIGH DOSE PF(Fluzone Trivalent) (Completed)       Return in about 3 months (around 06/12/2024) for Reassessment.   Garnette CHRISTELLA Simpler, MD  I,Emily Lagle,acting as a scribe for Garnette CHRISTELLA Simpler, MD.,have documented all relevant documentation on the behalf of Garnette CHRISTELLA Simpler, MD.  I, Garnette CHRISTELLA Simpler, MD, have reviewed all documentation for this visit. The documentation on 03/12/2024 for the exam, diagnosis, procedures, and orders are all accurate and complete.

## 2024-03-12 NOTE — Assessment & Plan Note (Signed)
 Continue chemotherapy and following with oncology. I reached out to Dr. Lanny to ask about whether Kim Castro needs a screening colonoscopy in light of her active treatment for colon cancer.

## 2024-03-12 NOTE — Assessment & Plan Note (Signed)
 Secondary to oxaliplatin . Continue pregabalin  and B complex.  Overall stable.

## 2024-03-12 NOTE — Assessment & Plan Note (Signed)
 As above

## 2024-03-12 NOTE — Assessment & Plan Note (Signed)
 Kim Castro's weight has increased a small amount over the past several months. In light of her cancer treatment, I do not feel that a primary focus on weight loss is appropriate at present.

## 2024-03-12 NOTE — Assessment & Plan Note (Addendum)
 LDL cholesterol at goal. Continue pravastatin  10 mg daily.

## 2024-03-12 NOTE — Assessment & Plan Note (Addendum)
 A1c has been meeting goals. Continue metformin  500 mg bid. I will check her A1c today.

## 2024-03-13 ENCOUNTER — Inpatient Hospital Stay

## 2024-03-19 ENCOUNTER — Other Ambulatory Visit: Payer: Self-pay | Admitting: Nurse Practitioner

## 2024-03-21 ENCOUNTER — Encounter: Payer: Self-pay | Admitting: Hematology

## 2024-03-24 NOTE — Assessment & Plan Note (Signed)
 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 Path showed overall Stage IIIB cancer.  -s/p 6 months adjuvant FOLFOX, completed 06/01/20, oxaliplatin  held for final 2 cycles due to neuropathy.  -s/p BSO and hysterectomy on 12/22/21 with Dr. Viktoria. Path revealed metastatic moderately differentiated colonic adenocarcinoma involving both ovaries and peritoneum -she restarted FOLFOX on 01/24/22. -Due to disease progression, chemotherapy changed to second line FOLFIRI and bevacizumab  on April 06, 2022. She has been tolerating moderately well, better now with dose reduction, will continue -Restaging CT scan from 09/27/2022 showed continous response in liver and peritoneal metastasis, no other new lesions.   -We discussed maintenance therapy option in future  -due to her PE in 10/2022, will hold beva for 3 months  -restaging CT 12/31/2022 showed stable disease and live met is not well defined on CT. She restarted beva on 01/17/23  -restaging CT 06/19/2023 showed overall stable disease (stable small lung nodules, no other evidence of cancer) Restaging CT 09/12/2023 showed no residual radiographic evidence of disease.   - We discussed option of maintenance therapy, treatment changed to capecitabine  and bevacizumab  on 10/02/2023 -CT 12/18/23 showed stable disease

## 2024-03-25 ENCOUNTER — Encounter: Payer: Self-pay | Admitting: Hematology

## 2024-03-25 ENCOUNTER — Inpatient Hospital Stay

## 2024-03-25 ENCOUNTER — Inpatient Hospital Stay: Admitting: Hematology

## 2024-03-25 VITALS — BP 168/75 | HR 59 | Temp 98.1°F | Resp 17 | Ht 65.5 in | Wt 220.1 lb

## 2024-03-25 VITALS — BP 156/68

## 2024-03-25 DIAGNOSIS — R110 Nausea: Secondary | ICD-10-CM | POA: Diagnosis not present

## 2024-03-25 DIAGNOSIS — C7800 Secondary malignant neoplasm of unspecified lung: Secondary | ICD-10-CM | POA: Diagnosis not present

## 2024-03-25 DIAGNOSIS — C787 Secondary malignant neoplasm of liver and intrahepatic bile duct: Secondary | ICD-10-CM | POA: Diagnosis not present

## 2024-03-25 DIAGNOSIS — Z5111 Encounter for antineoplastic chemotherapy: Secondary | ICD-10-CM | POA: Diagnosis not present

## 2024-03-25 DIAGNOSIS — Z7901 Long term (current) use of anticoagulants: Secondary | ICD-10-CM | POA: Diagnosis not present

## 2024-03-25 DIAGNOSIS — Z8049 Family history of malignant neoplasm of other genital organs: Secondary | ICD-10-CM | POA: Diagnosis not present

## 2024-03-25 DIAGNOSIS — Z86711 Personal history of pulmonary embolism: Secondary | ICD-10-CM | POA: Diagnosis not present

## 2024-03-25 DIAGNOSIS — C182 Malignant neoplasm of ascending colon: Secondary | ICD-10-CM

## 2024-03-25 DIAGNOSIS — T451X5A Adverse effect of antineoplastic and immunosuppressive drugs, initial encounter: Secondary | ICD-10-CM | POA: Diagnosis not present

## 2024-03-25 DIAGNOSIS — R600 Localized edema: Secondary | ICD-10-CM | POA: Diagnosis not present

## 2024-03-25 DIAGNOSIS — C7963 Secondary malignant neoplasm of bilateral ovaries: Secondary | ICD-10-CM | POA: Diagnosis not present

## 2024-03-25 DIAGNOSIS — C786 Secondary malignant neoplasm of retroperitoneum and peritoneum: Secondary | ICD-10-CM | POA: Diagnosis not present

## 2024-03-25 DIAGNOSIS — Z803 Family history of malignant neoplasm of breast: Secondary | ICD-10-CM | POA: Diagnosis not present

## 2024-03-25 DIAGNOSIS — G629 Polyneuropathy, unspecified: Secondary | ICD-10-CM | POA: Diagnosis not present

## 2024-03-25 DIAGNOSIS — I10 Essential (primary) hypertension: Secondary | ICD-10-CM | POA: Diagnosis not present

## 2024-03-25 DIAGNOSIS — Z800 Family history of malignant neoplasm of digestive organs: Secondary | ICD-10-CM | POA: Diagnosis not present

## 2024-03-25 DIAGNOSIS — Z923 Personal history of irradiation: Secondary | ICD-10-CM | POA: Diagnosis not present

## 2024-03-25 LAB — CBC WITH DIFFERENTIAL (CANCER CENTER ONLY)
Abs Immature Granulocytes: 0.01 K/uL (ref 0.00–0.07)
Basophils Absolute: 0 K/uL (ref 0.0–0.1)
Basophils Relative: 1 %
Eosinophils Absolute: 0.2 K/uL (ref 0.0–0.5)
Eosinophils Relative: 4 %
HCT: 34.7 % — ABNORMAL LOW (ref 36.0–46.0)
Hemoglobin: 11.6 g/dL — ABNORMAL LOW (ref 12.0–15.0)
Immature Granulocytes: 0 %
Lymphocytes Relative: 40 %
Lymphs Abs: 2 K/uL (ref 0.7–4.0)
MCH: 36.6 pg — ABNORMAL HIGH (ref 26.0–34.0)
MCHC: 33.4 g/dL (ref 30.0–36.0)
MCV: 109.5 fL — ABNORMAL HIGH (ref 80.0–100.0)
Monocytes Absolute: 0.6 K/uL (ref 0.1–1.0)
Monocytes Relative: 11 %
Neutro Abs: 2.2 K/uL (ref 1.7–7.7)
Neutrophils Relative %: 44 %
Platelet Count: 131 K/uL — ABNORMAL LOW (ref 150–400)
RBC: 3.17 MIL/uL — ABNORMAL LOW (ref 3.87–5.11)
RDW: 14.3 % (ref 11.5–15.5)
WBC Count: 5 K/uL (ref 4.0–10.5)
nRBC: 0 % (ref 0.0–0.2)

## 2024-03-25 LAB — CMP (CANCER CENTER ONLY)
ALT: 18 U/L (ref 0–44)
AST: 31 U/L (ref 15–41)
Albumin: 4.1 g/dL (ref 3.5–5.0)
Alkaline Phosphatase: 84 U/L (ref 38–126)
Anion gap: 9 (ref 5–15)
BUN: 16 mg/dL (ref 8–23)
CO2: 26 mmol/L (ref 22–32)
Calcium: 9.5 mg/dL (ref 8.9–10.3)
Chloride: 109 mmol/L (ref 98–111)
Creatinine: 0.7 mg/dL (ref 0.44–1.00)
GFR, Estimated: >60 mL/min (ref >60)
Glucose, Bld: 104 mg/dL — ABNORMAL HIGH (ref 70–99)
Potassium: 4.4 mmol/L (ref 3.5–5.1)
Sodium: 144 mmol/L (ref 135–145)
Total Bilirubin: 1.4 mg/dL — ABNORMAL HIGH (ref 0.0–1.2)
Total Protein: 6.6 g/dL (ref 6.5–8.1)

## 2024-03-25 MED ORDER — SODIUM CHLORIDE 0.9 % IV SOLN
2400.0000 mg/m2 | INTRAVENOUS | Status: DC
  Administered 2024-03-25: 5000 mg via INTRAVENOUS
  Filled 2024-03-25: qty 100

## 2024-03-25 MED ORDER — SODIUM CHLORIDE 0.9 % IV SOLN
400.0000 mg/m2 | Freq: Once | INTRAVENOUS | Status: AC
  Administered 2024-03-25: 856 mg via INTRAVENOUS
  Filled 2024-03-25: qty 42.8

## 2024-03-25 MED ORDER — SODIUM CHLORIDE 0.9 % IV SOLN
5.0000 mg/kg | Freq: Once | INTRAVENOUS | Status: AC
  Administered 2024-03-25: 500 mg via INTRAVENOUS
  Filled 2024-03-25: qty 4

## 2024-03-25 MED ORDER — PROCHLORPERAZINE MALEATE 10 MG PO TABS
10.0000 mg | ORAL_TABLET | Freq: Once | ORAL | Status: AC
  Administered 2024-03-25: 10 mg via ORAL
  Filled 2024-03-25: qty 1

## 2024-03-25 MED ORDER — SODIUM CHLORIDE 0.9 % IV SOLN: INTRAVENOUS | Status: DC

## 2024-03-25 NOTE — Progress Notes (Signed)
 Confirmed with Dr. Lanny that urine proteins can be collected every 3rd cycle due to previous urine proteins being negative for a while.  Harlene Nasuti, PharmD Oncology Infusion Pharmacist 03/25/2024 11:26 AM

## 2024-03-25 NOTE — Patient Instructions (Signed)
 [facility] - A DEPT OF [place] [place] [facility]  Discharge Instructions: Thank you for choosing [facility] to provide your oncology and hematology care.   If you have a lab appointment with the Cancer Center, please go directly to the Cancer Center and check in at the registration area.   Wear comfortable clothing and clothing appropriate for easy access to any Portacath or PICC line.   We strive to give you quality time with your provider. You may need to reschedule your appointment if you arrive late (15 or more minutes).  Arriving late affects you and other patients whose appointments are after yours.  Also, if you miss three or more appointments without notifying the office, you may be dismissed from the clinic at the provider's discretion.      For prescription refill requests, have your pharmacy contact our office and allow 72 hours for refills to be completed.    Today you received the following chemotherapy and/or immunotherapy agents: Bevacizumab , Leucovorin , Fluorouracil      To help prevent nausea and vomiting after your treatment, we encourage you to take your nausea medication as directed.  BELOW ARE SYMPTOMS THAT SHOULD BE REPORTED IMMEDIATELY: *FEVER GREATER THAN 100.4 F (38 C) OR HIGHER *CHILLS OR SWEATING *NAUSEA AND VOMITING THAT IS NOT CONTROLLED WITH YOUR NAUSEA MEDICATION *UNUSUAL SHORTNESS OF BREATH *UNUSUAL BRUISING OR BLEEDING *URINARY PROBLEMS (pain or burning when urinating, or frequent urination) *BOWEL PROBLEMS (unusual diarrhea, constipation, pain near the anus) TENDERNESS IN MOUTH AND THROAT WITH OR WITHOUT PRESENCE OF ULCERS (sore throat, sores in mouth, or a toothache) UNUSUAL RASH, SWELLING OR PAIN  UNUSUAL VAGINAL DISCHARGE OR ITCHING   Items with * indicate a potential emergency and should be followed up as soon as possible or go to the Emergency Department if any problems should occur.  Please show the CHEMOTHERAPY  ALERT CARD or IMMUNOTHERAPY ALERT CARD at check-in to the Emergency Department and triage nurse.  Should you have questions after your visit or need to cancel or reschedule your appointment, please contact [facility]  Dept: [phone]  and follow the prompts.  Office hours are 8:00 a.m. to 4:30 p.m. Monday - Friday. Please note that voicemails left after 4:00 p.m. may not be returned until the following business day.  We are closed weekends and major holidays. You have access to a nurse at all times for urgent questions. Please call the main number to the clinic Dept: [phone] and follow the prompts.   For any non-urgent questions, you may also contact your provider using MyChart. We now offer e-Visits for anyone 40 and older to request care online for non-urgent symptoms. For details visit [email or web address].   Also download the MyChart app! Go to the app store, search MyChart, open the app, select , and log in with your MyChart username and password.

## 2024-03-25 NOTE — Progress Notes (Signed)
 [facility]   Telephone:([phone]) [phone] [phone]   Clinic Follow up Note   Patient Care Team: [name], MD as PCP - General (Family Medicine) [name], MD as Consulting Physician (General Surgery) [name], MD as Consulting Physician (Hematology) [name], MD as Consulting Physician (General Surgery) [name], MD as Consulting Physician (Hematology and Oncology) [facility], [facility]   Date of Service:  03/25/2024  CHIEF COMPLAINT: f/u of metastatic colon cancer  CURRENT THERAPY:  Maintenance chemotherapy 5-FU pump infusion and bevacizumab   Oncology History   Malignant neoplasm of ascending colon (HCC) pT3N2aM0 stage IIB, MSS, metastatic to b/l ovaries and peritoneum and liver, KRAS mutation G12V (+)  -Initially diagnosed in 08/2019, s/p resection with Dr Debby on 11/01/19. Path showed overall Stage IIIB cancer.  -s/p 6 months adjuvant FOLFOX, completed 06/01/20, oxaliplatin  held for final 2 cycles due to neuropathy.  -s/p BSO and hysterectomy on 12/22/21 with Dr. Viktoria. Path revealed metastatic moderately differentiated colonic adenocarcinoma involving both ovaries and peritoneum -she restarted FOLFOX on 01/24/22. -Due to disease progression, chemotherapy changed to second line FOLFIRI and bevacizumab  on April 06, 2022. She has been tolerating moderately well, better now with dose reduction, will continue -Restaging CT scan from 09/27/2022 showed continous response in liver and peritoneal metastasis, no other new lesions.   -We discussed maintenance therapy option in future  -due to her PE in 10/2022, will hold beva for 3 months  -restaging CT 12/31/2022 showed stable disease and live met is not well defined on CT. She restarted beva on 01/17/23  -restaging CT 06/19/2023 showed overall stable disease (stable small lung nodules, no other evidence of cancer) Restaging CT 09/12/2023 showed no residual radiographic  evidence of disease.   - We discussed option of maintenance therapy, treatment changed to capecitabine  and bevacizumab  on 10/02/2023.  Due to significant fatigue, I changed capecitabine  to 5-FU pump infusion in November 2025. -CT 12/18/23 showed stable disease  Assessment & Plan Metastatic colon cancer Managed with chemotherapy. Recent infusion associated with nausea and headache, possibly exacerbated by flu vaccination. Bilirubin levels slightly elevated at 1.4, previously up to 2.8 when on oral medication. Current infusion regimen appears to have a better effect on bilirubin levels. - Continue current chemotherapy regimen with infusion pump - Monitor bilirubin levels  Nausea secondary to chemotherapy Nausea occurred post-infusion, managed with antiemetic medication. Symptoms resolved after three days of medication use. - Continue antiemetic medication as needed - Advised to take antiemetic earlier if nausea occurs  Hypertension Blood pressure elevated during visit. Currently managed with losartan . Reports weight gain and peripheral edema, possibly contributing to elevated blood pressure. - Continue losartan  for blood pressure management  Peripheral edema Reports swelling in feet and hands, particularly after prolonged sitting or driving.  Peripheral neuropathy Mild peripheral neuropathy affecting hands, with symptoms exacerbated by cold. No significant impact on daily activities. - Continue current management and monitor symptoms   Plan - Lab reviewed, adequate for treatment, will proceed 5-FU/leucovorin  and bevacizumab  today, and continue every 2 weeks - Follow-up in 2 weeks  SUMMARY OF ONCOLOGIC HISTORY: Oncology History Overview Note   Cancer Staging  Malignant neoplasm of female breast [facility]) Staging form: Breast, AJCC 7th Edition - Clinical stage from 06/11/2014: Stage 0 (Tis (DCIS), N0, M0) - Unsigned Staged by: Pathologist and managing physician Laterality: Left Estrogen  receptor status: Positive Progesterone receptor status: Positive Stage used in treatment planning: Yes National guidelines used in treatment planning: Yes Type of national  guideline used in treatment planning: NCCN - Pathologic stage from 07/03/2014: Stage Unknown (Tis (DCIS), NX, cM0) - Signed by Guinevere Chew, MD on 07/10/2014 Staged by: Pathologist Laterality: Left Estrogen receptor status: Positive Progesterone receptor status: Positive Stage used in treatment planning: Yes National guidelines used in treatment planning: Yes Type of national guideline used in treatment planning: NCCN Staging comments: Staged on final lumpectomy specimen by Dr. Loral.  Right colon cancer Staging form: Colon and Rectum, AJCC 8th Edition - Pathologic stage from 11/01/2019: Stage IIIB (pT3, pN2a, cM0) - Signed by [name], MD on 11/29/2019 Stage prefix: Initial diagnosis Histologic grading system: 4 grade system Histologic grade (G): G2 Residual tumor (R): R0 - None Tumor deposits (TD): Absent Perineural invasion (PNI): Absent Microsatellite instability (MSI): Stable KRAS mutation: Unknown NRAS mutation: Unknown BRAF mutation: Unknown     Malignant neoplasm of female breast (HCC) (Resolved)  05/29/2014 Initial Biopsy   Left breast needle core biopsy: Grade 2, DCIS with calcs. ER+ (100%), PR+ (96%).    06/04/2014 Initial Diagnosis   Left breast DCIS with calcifications, ER 100%, PR 96%   06/10/2014 Breast MRI   Left breast: 2.4 x 1.3 x 1.1 cm area of patchy non-mass enhancement upper outer quadrant includes postbiopsy seroma; Right breast: 1.2 cm previously biopsied stable benign fibroadenoma   06/12/2014 Procedure   Genetic counseling/testing: Identified 1 VUS on CHEK2 gene. Remainder of 17 gene panel tested negative and included: ATM, BARD1, BRCA 1/2, BRIP1, CDH1, CHEK2, EPCAM, MLH1, MSH2, MSH6, NBN, NF1, PALB2, PTEN, RAD50, RAD51C, RAD51D, STK11, and TP53.    07/01/2014 Surgery   Left breast  lumpectomy ([place]): Grade 1, DCIS, spanning 2.3 cm, 1 mm margin, ER 100%, PR 96%   07/31/2014 - 08/28/2014 Radiation Therapy   Adjuvant RT completed Signe). Left breast: Total dose 42.5 Gy over 17 fractions. Left breast boost: Total dose 7.5 Gy over 3 fractions.    09/14/2014 - 01/13/2020 Anti-estrogen oral therapy   Anastrazole 1mg  daily. Planned duration of treatment: 5 years ([place]). Completed in 01/2020.    09/25/2014 Survivorship   Survivorship Care Plan given to patient and reviewed with her in person.    03/02/2021 Imaging   CT CAP  IMPRESSION: 1. No findings of active/recurrent malignancy. Partial right hemicolectomy. 2. Endometrial stripe remains mildly thickened, but endometrial biopsy in May was negative for malignancy. 3. Progressive endplate sclerosis and endplate irregularity at T2-3, probably due to degenerative endplate findings. If the has referable upper thoracic pain/symptoms then thoracic spine MRI could be used for further workup. 4. Other imaging findings of potential clinical significance: Mild cardiomegaly. Aortic Atherosclerosis (ICD10-I70.0). Mild mitral valve calcification. Postoperative findings in the left breast with adjacent radiation port anteriorly in the left upper lobe. Tiny pulmonary nodules in the left lower lobe are unchanged from earliest available comparison of 10/17/2019 and probably benign although may merit surveillance. Multilevel lumbar impingement. Mild pelvic floor laxity.   Malignant neoplasm of ascending colon (HCC)  09/19/2019 Imaging   CT AP W contrast 09/19/19  IMPRESSION Fullness in the cecum, cannot exclude a mass. No evidence for metastatic disease is identified.    09/24/2019 Procedure   Colonoscopy by Dr Ernesto 09/24/19 IMPRESSION 1. The colon was redundant  2. Mild diverticulosis was noted through the entire examined colon 3. Single 12mm polyp was found in the ascending colon; polypectomy was performed using snare cautery  and biopsy forceps 4. Mild diverticulosis was notes in the descending colon and sigmoid colon.  5. Single polyp was found  in the sigmoid colon, polypectomy was performed with cold forceps.  6. Single polyp was found in the rectosigmoid colon; polypectomy was performed with cold snare  7. Small internal hemorrhoids  8. Large mass was found at the cecum; multiple biopsies of the area were performed using cold forceps; injection (tattooing) was performed distal to the mass.    09/24/2019 Initial Biopsy   INTERPRETATION AND DIAGNOSIS:  A. Cecum, biopsy:  Invasive moderately differentiated adenocarcinoma.  see comment  B. Polyp @ ascending colon, polypectomy:  Tubular Adenoma  C. Polyp @ sigmoid colon Polypectomy:  hyperplastic polyp.  D. Polyp @ rectosigmoid colon, Polypectomy:  Hyperplastic Polyp      10/16/2019 Imaging   CT Chest IMPRESSION: 1. Multiple small pulmonary nodules measuring 5 mm or less in size in the lungs. These are nonspecific and are typically considered statistically likely benign. However, given the patient's history of primary malignancy, close attention on follow-up studies is recommended to ensure stability. 2. Aortic atherosclerosis, in addition to right coronary artery disease. Assessment for potential risk factor modification, dietary therapy or pharmacologic therapy may be warranted, if clinically indicated. 3. There are calcifications of the aortic valve and mitral annulus. Echocardiographic correlation for evaluation of potential valvular dysfunction may be warranted if clinically indicated. 4. Small hiatal hernia.   Aortic Atherosclerosis (ICD10-I70.0).   11/01/2019 Initial Diagnosis   Colon cancer (HCC)   11/01/2019 Surgery   LAPAROSCOPIC PARTIAL COLECTOMY by Dr Debby and Dr Sheldon   11/01/2019 Pathology Results   FINAL MICROSCOPIC DIAGNOSIS:   A. COLON, PROXIMAL RIGHT, COLECTOMY:  - Invasive colonic adenocarcinoma, 5 cm.  - Tumor invades through  the muscularis propria into pericolonic tissues.   - Margins of resection are not involved.  - Metastatic carcinoma in (5) of (13) lymph nodes.  - See oncology table.    MSI Stable  Mismatch repair normal  MLH1 - Preserved nuclear expression (greater 50% tumor expression) MSH2 - Preserved nuclear expression (greater 50% tumor expression) MSH6 - Preserved nuclear expression (greater 50% tumor expression) PMS2 - Preserved nuclear expression (greater 50% tumor expression)   11/01/2019 Cancer Staging   Staging form: Colon and Rectum, AJCC 8th Edition - Pathologic stage from 11/01/2019: Stage IIIB (pT3, pN2a, cM0) - Signed by [name], MD on 11/29/2019   12/10/2019 Procedure   PAC placed 12/10/19   12/17/2019 - 06/01/2020 Chemotherapy   FOLFOX q2weeks starting in 2 weeks starting 12/17/19. Held 01/27/20-02/10/20 due to b/l PE. Oxaliplatin  held C11-12 due to neuropathy. Completed on 06/01/20   03/02/2021 Imaging   CT CAP  IMPRESSION: 1. No findings of active/recurrent malignancy. Partial right hemicolectomy. 2. Endometrial stripe remains mildly thickened, but endometrial biopsy in May was negative for malignancy. 3. Progressive endplate sclerosis and endplate irregularity at T2-3, probably due to degenerative endplate findings. If the has referable upper thoracic pain/symptoms then thoracic spine MRI could be used for further workup. 4. Other imaging findings of potential clinical significance: Mild cardiomegaly. Aortic Atherosclerosis (ICD10-I70.0). Mild mitral valve calcification. Postoperative findings in the left breast with adjacent radiation port anteriorly in the left upper lobe. Tiny pulmonary nodules in the left lower lobe are unchanged from earliest available comparison of 10/17/2019 and probably benign although may merit surveillance. Multilevel lumbar impingement. Mild pelvic floor laxity.   08/26/2021 Imaging   EXAM: CT CHEST, ABDOMEN, AND PELVIS WITH CONTRAST  IMPRESSION: 1.  Stable examination without new or progressive findings to suggest local recurrence or metastatic disease within the chest, abdomen, or pelvis. 2. Hepatomegaly  with hepatic steatosis. 3. Sigmoid colonic diverticulosis without findings of acute diverticulitis. 4. Similar prominent endplate sclerosis and irregularity at T2-T3 is most consistent with Modic type endplate degenerative changes. However, if patient has referable upper thoracic pain consider further workup with thoracic spine MRI. 5. Similar thickening of the endometrial stripe measuring up to 8 mm, which was previously biopsied with results negative for malignancy. 6. Aortic Atherosclerosis (ICD10-I70.0).   11/10/2021 PET scan   IMPRESSION: 1. LEFT ovary is increased in size and is intensely hypermetabolic. While physiologic hypermetabolic ovarian tissue is not uncommon, the enlargement and asymmetric activity warrants further evaluation. Consider contrast pelvic MRI vs tissue sampling. 2. No evidence of metastatic colorectal carcinoma otherwise. 3. Post RIGHT hemicolectomy anatomy. 4. Evidence of radiation change in the LEFT upper lobe (remote breast cancer).     12/22/2021 Relapse/Recurrence    FINAL MICROSCOPIC DIAGNOSIS:   A. LEFT OVARY AND FALLOPIAN TUBE, SALPINGO OOPHORECTOMY:  - Metastatic moderately differentiated colonic adenocarcinoma involving left ovary  - Focal ovarian stromal calcification  - Segment of benign fallopian tube   B. UTERUS WITH RIGHT FALLOPIAN TUBE AND OVARY, HYSTERECTOMY AND SALPINGO-OOPHORECTOMY:  - Metastatic moderately differentiated colonic adenocarcinoma involving right ovary  - Focal invasive extensive adenomyosis  - Benign endometrial polyps  - Benign proliferative phase endometrium  - Hydrosalpinx of right fallopian tube  - Focal ovarian stromal calcification   C. PERITONEAL DEPOSITS, ANTERIOR CUL DE SAC, BIOPSY:  - Metastatic mucinous adenocarcinoma, consistent with colorectal  primary    COMMENT:  Immunohistochemical stains show that the tumor cells are positive for CK20 and CDX2 while they are negative for CK7 and PAX8, consistent with above interpretation.    01/24/2022 - 03/23/2022 Chemotherapy   Patient is on Treatment Plan : COLORECTAL FOLFOX q14d x 3 months     04/01/2022 Imaging   CT AP IMPRESSION: 1. New omental metastatic disease. 2. Vague hypoattenuating lesion in the inferior right hepatic lobe is new and also worrisome for metastatic disease. 3. Similar small right lower lobe nodules. Recommend continued attention on follow-up. 4. Steatotic enlarged liver. 5.  Aortic atherosclerosis (ICD10-I70.0).   04/06/2022 - 09/18/2023 Chemotherapy   Patient is on Treatment Plan : COLORECTAL FOLFIRI + Bevacizumab  q14d     04/21/2022 Imaging    IMPRESSION: 1. Stable chest CT. No evidence of metastatic disease. 2. Stable small pulmonary nodules, considered benign based on stability. 3. Stable postsurgical changes in the left breast and anterior left upper lobe radiation changes. 4. Aortic Atherosclerosis (ICD10-I70.0) and Emphysema (ICD10-J43.9).   04/21/2022 Imaging    IMPRESSION: 1. Small enhancing lesion inferiorly in the right hepatic lobe is typical of metastatic disease and stable from recent abdominal CT. No other definite liver lesions are identified. Mild hepatic steatosis. 2. No other significant abdominal findings. 3. Omental nodularity seen on CT is not well visualized by MRI.    Imaging     06/23/2022 Imaging    IMPRESSION: 1. Hypodense lesion of the inferior right lobe of the liver, hepatic segment VI is diminished in size, consistent with treatment response. 2. Tiny peritoneal and omental nodules identified by prior examination are diminished in size, consistent with treatment response. 3. Multiple tiny pulmonary nodules unchanged, most likely benign and incidental, however continued attention on follow-up warranted in the  setting of known metastatic disease. 4. No evidence of new metastatic disease in the chest, abdomen, or pelvis. 5. Status post partial right hemicolectomy and reanastomosis. 6. Diffuse mosaic attenuation of the airspaces, consistent with small airways  disease. 7. Hepatomegaly.   10/02/2023 - 02/26/2024 Chemotherapy   Patient is on Treatment Plan : COLORECTAL Bevacizumab  q21d     12/18/2023 Imaging   CT CAP with contrast IMPRESSION: 1. Prior partial right hemicolectomy with ileocolic anastomosis. No suspicious nodularity along the suture line. Prominent adjacent lymph nodes are similar to prior. 2. Stable tiny scattered pulmonary nodules. 3. No evidence of new or progressive disease in the chest, abdomen or pelvis. 4. Hepatomegaly with hepatic steatosis. 5. Aortic atherosclerosis.     03/11/2024 -  Chemotherapy   Patient is on Treatment Plan : COLORECTAL 5FU + Leucovorin  (Modified [place]) + Bevacizumab  q14d        Discussed the use of AI scribe software for clinical note transcription with the patient, who gave verbal consent to proceed.  History of Present Illness Kim Castro is a 78 year old female with metastatic colon cancer who presents for follow-up.  She experienced nausea and a severe headache following her last treatment, which coincided with receiving a flu shot. The nausea was managed with medication for three days, and the headache, rated as an eight out of ten in severity, resolved after two to three days. She is uncertain if these symptoms were due to the treatment, the flu shot, or a combination of both.  Her bilirubin level today is 1.4, previously reaching as high as 2.8. Elevated levels were noted when on oral medication compared to the current pump infusion.  Her blood pressure is high, attributed to weight gain and eating habits. She experiences swelling in her feet and hands, especially when sitting or driving for extended periods. She is currently  taking losartan  for blood pressure management.  She experiences mild neuropathy in her hands, exacerbated by cold temperatures, with symptoms including achiness and throbbing in two fingers. She remains able to perform daily activities without significant difficulty.     All other systems were reviewed with the patient and are negative.  MEDICAL HISTORY:  Past Medical History:  Diagnosis Date   Aortic atherosclerosis    Arthritis    feet, lower back   Basal cell carcinoma    arm   BRCA gene mutation negative    Breast cancer of upper-outer quadrant of left female breast (HCC) 06/04/2014   Cataract    immature on the left   Colon cancer (HCC) 08/2019   Diabetes mellitus without complication (HCC)    Diverticulosis    Dizziness    > 41yrs ago;took Antivert    Family history of anesthesia complication    sister slow to wake up with anesthesia   Family history of breast cancer    Family history of colon cancer    Family history of uterine cancer    GERD (gastroesophageal reflux disease)    takes occasional TUMs   History of bronchitis    > 53yrs ago   History of colon polyps    History of hiatal hernia    Small noted on CT   History of pulmonary embolus (PE)    Hypertension    takes Losartan  daily and HCTZ   Iron deficiency anemia    Joint pain    Metastatic disease (HCC) 2023   peritoneum and liver mets   Numbness    to toes on each foot   Peripheral neuropathy    feet and toes   Personal history of radiation therapy    Pulmonary nodules    Noted on CT   Radiation 07/31/14-08/28/14   Left Breast  20 fxs   Seasonal allergies    takes Claritin prn   Urinary frequency    Vitamin D deficiency    takes VIt D daily    SURGICAL HISTORY: Past Surgical History:  Procedure Laterality Date   BREAST BIOPSY Bilateral    BREAST LUMPECTOMY Left    BREAST LUMPECTOMY WITH RADIOACTIVE SEED LOCALIZATION Left 07/01/2014   Procedure: LEFT BREAST LUMPECTOMY WITH RADIOACTIVE SEED  LOCALIZATION;  Surgeon: Morene Olives, MD;  Location: [facility];  Service: General;  Laterality: Left;   CATARACT EXTRACTION Right    COLONOSCOPY  09/24/2019   [place]   LAPAROSCOPIC PARTIAL COLECTOMY N/A 11/01/2019   Procedure: LAPAROSCOPIC PARTIAL COLECTOMY;  Surgeon: [name], MD;  Location: [facility];  Service: General;  Laterality: N/A;   POLYPECTOMY     PORTACATH PLACEMENT N/A 12/10/2019   Procedure: INSERTION PORT-A-CATH ULTRASOUND GUIDED IN RIGHT IJ;  Surgeon: [name], MD;  Location: [facility];  Service: General;  Laterality: N/A;   ROBOTIC ASSISTED TOTAL HYSTERECTOMY Bilateral 12/22/2021   Procedure: XI ROBOTIC ASSISTED TOTAL HYSTERECTOMY WITH BILATERAL SALPINGO OOPHORECTOMY;  Surgeon: Viktoria Comer SAUNDERS, MD;  Location: [facility];  Service: Gynecology;  Laterality: Bilateral;   TOTAL KNEE ARTHROPLASTY Left 10/24/2012   Procedure: TOTAL KNEE ARTHROPLASTY;  Surgeon: Dempsey JINNY Sensor, MD;  Location: [facility];  Service: Orthopedics;  Laterality: Left;   TOTAL KNEE ARTHROPLASTY Right 01/09/2013   Procedure: TOTAL KNEE ARTHROPLASTY;  Surgeon: Dempsey JINNY Sensor, MD;  Location: [facility];  Service: Orthopedics;  Laterality: Right;   TUBAL LIGATION      I have reviewed the social history and family history with the patient and they are unchanged from previous note.  ALLERGIES:  is allergic to oxycodone .  MEDICATIONS:  Current Outpatient Medications  Medication Sig Dispense Refill   b complex vitamins capsule Take 1 capsule by mouth daily.     Cholecalciferol (VITAMIN D) 2000 UNITS CAPS Take 2,000 Units by mouth daily.      ELIQUIS  5 MG TABS tablet Take 1 tablet by mouth twice daily 60 tablet 0   famotidine (PEPCID) 20 MG tablet Take 20 mg by mouth daily.     ibuprofen (ADVIL) 200 MG tablet Take 200 mg by mouth daily as needed (pain).     lidocaine -prilocaine  (EMLA ) cream Apply to affected area once 30 g 3   loratadine (CLARITIN) 10 MG tablet Take 10 mg by mouth daily as  needed for allergies.     losartan  (COZAAR ) 25 MG tablet Take 1 tablet by mouth once daily 90 tablet 2   magic mouthwash (nystatin , lidocaine , diphenhydrAMINE , alum & mag hydroxide) suspension Swish and swallow 4 times daily as needed for mouth pain. 140 mL 1   metFORMIN  (GLUCOPHAGE ) 500 MG tablet TAKE 1 TABLET BY MOUTH IN THE MORNING AND AT BEDTIME 180 tablet 3   Multiple Vitamins-Minerals (MULTIVITAMIN WITH MINERALS) tablet Take 1 tablet by mouth daily.     ondansetron  (ZOFRAN ) 8 MG tablet Take 1 tablet (8 mg total) by mouth every 8 (eight) hours as needed for nausea or vomiting. 30 tablet 1   potassium chloride  (KLOR-CON ) 10 MEQ tablet Take 1 tablet (10 mEq total) by mouth daily. 60 tablet 0   pravastatin  (PRAVACHOL ) 10 MG tablet Take 1 tablet by mouth once daily 90 tablet 3   pregabalin  (LYRICA ) 25 MG capsule TAKE 1 CAPSULE BY MOUTH ONCE DAILY IN ADDITION TO 50 MG  AT BEDTIME 30 capsule 1   pregabalin  (LYRICA ) 50 MG  capsule TAKE 1 CAPSULE BY MOUTH TWICE DAILY IN ADDITION TO THE 25 MG CAPSULE AT BEDTIME FOR A TOTAL OF 75 MG AT BEDTIME. 60 capsule 0   prochlorperazine  (COMPAZINE ) 10 MG tablet Take 1 tablet (10 mg total) by mouth every 6 (six) hours as needed for nausea or vomiting. 30 tablet 1   traMADol  (ULTRAM ) 50 MG tablet Take 1 tablet (50 mg total) by mouth every 6 (six) hours as needed. 30 tablet 0   triamcinolone  ointment (KENALOG ) 0.5 % Apply 1 Application topically 2 (two) times daily. 30 g 0   No current facility-administered medications for this visit.   Facility-Administered Medications Ordered in Other Visits  Medication Dose Route Frequency Provider Last Rate Last Admin   0.9 %  sodium chloride  infusion   Intravenous Continuous [name], MD   Stopped at 03/25/24 1034   fluorouracil  (ADRUCIL ) 5,000 mg in sodium chloride  0.9 % 150 mL chemo infusion  2,400 mg/m2 (Treatment Plan Recorded) Intravenous 1 day or 1 dose [name], MD   Infusion Verify at 03/25/24 1041    PHYSICAL  EXAMINATION: ECOG PERFORMANCE STATUS: 1 - Symptomatic but completely ambulatory  Vitals:   03/25/24 0825 03/25/24 0829  BP: (!) 175/75 (!) 168/75  Pulse: 60 (!) 59  Resp:    Temp:    SpO2: 99% 100%   Wt Readings from Last 3 Encounters:  03/25/24 220 lb 1.6 oz (99.8 kg)  03/12/24 220 lb 9.6 oz (100.1 kg)  03/11/24 218 lb 4.8 oz (99 kg)     GENERAL:alert, no distress and comfortable SKIN: skin color, texture, turgor are normal, no rashes or significant lesions EYES: normal, Conjunctiva are pink and non-injected, sclera clear Musculoskeletal:no cyanosis of digits and no clubbing  NEURO: alert & oriented x 3 with fluent speech, no focal motor/sensory deficits  Physical Exam   LABORATORY DATA:  I have reviewed the data as listed    Latest Ref Rng & Units 03/25/2024    7:40 AM 03/11/2024   12:31 PM 02/26/2024    9:12 AM  CBC  WBC 4.0 - 10.5 K/uL 5.0  6.6  5.1   Hemoglobin 12.0 - 15.0 g/dL 88.3  88.9  88.5   Hematocrit 36.0 - 46.0 % 34.7  32.7  34.0   Platelets 150 - 400 K/uL 131  223  135         Latest Ref Rng & Units 03/25/2024    7:40 AM 03/12/2024    8:52 AM 03/11/2024   12:31 PM  CMP  Glucose 70 - 99 mg/dL 895  894  895   BUN 8 - 23 mg/dL 16   19   Creatinine 9.55 - 1.00 mg/dL 9.29   9.27   Sodium 864 - 145 mmol/L 144   141   Potassium 3.5 - 5.1 mmol/L 4.4   4.4   Chloride 98 - 111 mmol/L 109   113   CO2 22 - 32 mmol/L 26   24   Calcium  8.9 - 10.3 mg/dL 9.5   9.3   Total Protein 6.5 - 8.1 g/dL 6.6   6.5   Total Bilirubin 0.0 - 1.2 mg/dL 1.4   1.1   Alkaline Phos 38 - 126 U/L 84   64   AST 15 - 41 U/L 31   21   ALT 0 - 44 U/L 18   11       RADIOGRAPHIC STUDIES: I have personally reviewed the radiological images as listed and agreed with the  findings in the report. No results found.    Orders Placed This Encounter  Procedures   CBC with Differential (Cancer Center Only)    Standing Status:   Future    Expected Date:   05/20/2024    Expiration  Date:   05/20/2025   CMP (Cancer Center only)    Standing Status:   Future    Expected Date:   05/20/2024    Expiration Date:   05/20/2025   CBC with Differential (Cancer Center Only)    Standing Status:   Future    Expected Date:   06/03/2024    Expiration Date:   06/03/2025   CMP (Cancer Center only)    Standing Status:   Future    Expected Date:   06/03/2024    Expiration Date:   06/03/2025   All questions were answered. The patient knows to call the clinic with any problems, questions or concerns. No barriers to learning was detected. The total time spent in the appointment was 25 minutes, including review of chart and various tests results, discussions about plan of care and coordination of care plan     Onita Mattock, MD 03/25/2024

## 2024-03-27 ENCOUNTER — Inpatient Hospital Stay

## 2024-04-07 NOTE — Assessment & Plan Note (Signed)
 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 Path showed overall Stage IIIB cancer.  -s/p 6 months adjuvant FOLFOX, completed 06/01/20, oxaliplatin  held for final 2 cycles due to neuropathy.  -s/p BSO and hysterectomy on 12/22/21 with Dr. Viktoria. Path revealed metastatic moderately differentiated colonic adenocarcinoma involving both ovaries and peritoneum -she restarted FOLFOX on 01/24/22. -Due to disease progression, chemotherapy changed to second line FOLFIRI and bevacizumab  on April 06, 2022. She has been tolerating moderately well, better now with dose reduction, will continue -Restaging CT scan from 09/27/2022 showed continous response in liver and peritoneal metastasis, no other new lesions.   -We discussed maintenance therapy option in future  -due to her PE in 10/2022, will hold beva for 3 months  -restaging CT 12/31/2022 showed stable disease and live met is not well defined on CT. She restarted beva on 01/17/23  -restaging CT 06/19/2023 showed overall stable disease (stable small lung nodules, no other evidence of cancer) Restaging CT 09/12/2023 showed no residual radiographic evidence of disease.   - We discussed option of maintenance therapy, treatment changed to capecitabine  and bevacizumab  on 10/02/2023.  Due to significant fatigue, I changed capecitabine  to 5-FU pump infusion in November 2025. -CT 12/18/23 and 03/08/2024 showed stable disease

## 2024-04-08 ENCOUNTER — Inpatient Hospital Stay

## 2024-04-08 ENCOUNTER — Inpatient Hospital Stay: Attending: Hematology

## 2024-04-08 ENCOUNTER — Inpatient Hospital Stay: Admitting: Hematology

## 2024-04-08 VITALS — BP 142/82 | HR 65 | Temp 98.4°F | Resp 17 | Ht 65.5 in | Wt 222.0 lb

## 2024-04-08 DIAGNOSIS — R519 Headache, unspecified: Secondary | ICD-10-CM | POA: Diagnosis not present

## 2024-04-08 DIAGNOSIS — H547 Unspecified visual loss: Secondary | ICD-10-CM | POA: Insufficient documentation

## 2024-04-08 DIAGNOSIS — C182 Malignant neoplasm of ascending colon: Secondary | ICD-10-CM | POA: Insufficient documentation

## 2024-04-08 DIAGNOSIS — H04209 Unspecified epiphora, unspecified lacrimal gland: Secondary | ICD-10-CM | POA: Insufficient documentation

## 2024-04-08 DIAGNOSIS — C787 Secondary malignant neoplasm of liver and intrahepatic bile duct: Secondary | ICD-10-CM | POA: Diagnosis not present

## 2024-04-08 DIAGNOSIS — Z923 Personal history of irradiation: Secondary | ICD-10-CM | POA: Insufficient documentation

## 2024-04-08 DIAGNOSIS — C7963 Secondary malignant neoplasm of bilateral ovaries: Secondary | ICD-10-CM | POA: Insufficient documentation

## 2024-04-08 DIAGNOSIS — Z800 Family history of malignant neoplasm of digestive organs: Secondary | ICD-10-CM | POA: Diagnosis not present

## 2024-04-08 DIAGNOSIS — Z5111 Encounter for antineoplastic chemotherapy: Secondary | ICD-10-CM | POA: Diagnosis present

## 2024-04-08 DIAGNOSIS — Z8049 Family history of malignant neoplasm of other genital organs: Secondary | ICD-10-CM | POA: Diagnosis not present

## 2024-04-08 DIAGNOSIS — C786 Secondary malignant neoplasm of retroperitoneum and peritoneum: Secondary | ICD-10-CM | POA: Insufficient documentation

## 2024-04-08 DIAGNOSIS — Z853 Personal history of malignant neoplasm of breast: Secondary | ICD-10-CM | POA: Insufficient documentation

## 2024-04-08 DIAGNOSIS — Z803 Family history of malignant neoplasm of breast: Secondary | ICD-10-CM | POA: Diagnosis not present

## 2024-04-08 LAB — CMP (CANCER CENTER ONLY)
ALT: 26 U/L (ref 0–44)
AST: 37 U/L (ref 15–41)
Albumin: 4.2 g/dL (ref 3.5–5.0)
Alkaline Phosphatase: 91 U/L (ref 38–126)
Anion gap: 10 (ref 5–15)
BUN: 22 mg/dL (ref 8–23)
CO2: 25 mmol/L (ref 22–32)
Calcium: 9.7 mg/dL (ref 8.9–10.3)
Chloride: 108 mmol/L (ref 98–111)
Creatinine: 0.74 mg/dL (ref 0.44–1.00)
GFR, Estimated: >60 mL/min (ref >60)
Glucose, Bld: 111 mg/dL — ABNORMAL HIGH (ref 70–99)
Potassium: 4.4 mmol/L (ref 3.5–5.1)
Sodium: 143 mmol/L (ref 135–145)
Total Bilirubin: 1.3 mg/dL — ABNORMAL HIGH (ref 0.0–1.2)
Total Protein: 6.8 g/dL (ref 6.5–8.1)

## 2024-04-08 LAB — CBC WITH DIFFERENTIAL (CANCER CENTER ONLY)
Abs Immature Granulocytes: 0.01 K/uL (ref 0.00–0.07)
Basophils Absolute: 0 K/uL (ref 0.0–0.1)
Basophils Relative: 0 %
Eosinophils Absolute: 0.2 K/uL (ref 0.0–0.5)
Eosinophils Relative: 3 %
HCT: 36.4 % (ref 36.0–46.0)
Hemoglobin: 12.1 g/dL (ref 12.0–15.0)
Immature Granulocytes: 0 %
Lymphocytes Relative: 31 %
Lymphs Abs: 2 K/uL (ref 0.7–4.0)
MCH: 36.1 pg — ABNORMAL HIGH (ref 26.0–34.0)
MCHC: 33.2 g/dL (ref 30.0–36.0)
MCV: 108.7 fL — ABNORMAL HIGH (ref 80.0–100.0)
Monocytes Absolute: 0.6 K/uL (ref 0.1–1.0)
Monocytes Relative: 9 %
Neutro Abs: 3.8 K/uL (ref 1.7–7.7)
Neutrophils Relative %: 57 %
Platelet Count: 171 K/uL (ref 150–400)
RBC: 3.35 MIL/uL — ABNORMAL LOW (ref 3.87–5.11)
RDW: 14.6 % (ref 11.5–15.5)
WBC Count: 6.5 K/uL (ref 4.0–10.5)
nRBC: 0 % (ref 0.0–0.2)

## 2024-04-08 LAB — TOTAL PROTEIN, URINE DIPSTICK: Protein, ur: NEGATIVE mg/dL

## 2024-04-08 MED ORDER — SODIUM CHLORIDE 0.9 % IV SOLN: INTRAVENOUS | Status: DC

## 2024-04-08 MED ORDER — PROCHLORPERAZINE MALEATE 10 MG PO TABS
10.0000 mg | ORAL_TABLET | Freq: Once | ORAL | Status: AC
  Administered 2024-04-08: 10 mg via ORAL
  Filled 2024-04-08: qty 1

## 2024-04-08 MED ORDER — SODIUM CHLORIDE 0.9 % IV SOLN
2400.0000 mg/m2 | INTRAVENOUS | Status: DC
  Administered 2024-04-08: 5000 mg via INTRAVENOUS
  Filled 2024-04-08: qty 100

## 2024-04-08 MED ORDER — SODIUM CHLORIDE 0.9 % IV SOLN
5.0000 mg/kg | Freq: Once | INTRAVENOUS | Status: AC
  Administered 2024-04-08: 500 mg via INTRAVENOUS
  Filled 2024-04-08: qty 4

## 2024-04-08 MED ORDER — SODIUM CHLORIDE 0.9 % IV SOLN
400.0000 mg/m2 | Freq: Once | INTRAVENOUS | Status: AC
  Administered 2024-04-08: 856 mg via INTRAVENOUS
  Filled 2024-04-08: qty 42.8

## 2024-04-08 NOTE — Patient Instructions (Signed)
 [facility] - A DEPT OF [place] [place] [facility]  Discharge Instructions: Thank you for choosing [facility] to provide your oncology and hematology care.   If you have a lab appointment with the Cancer Center, please go directly to the Cancer Center and check in at the registration area.   Wear comfortable clothing and clothing appropriate for easy access to any Portacath or PICC line.   We strive to give you quality time with your provider. You may need to reschedule your appointment if you arrive late (15 or more minutes).  Arriving late affects you and other patients whose appointments are after yours.  Also, if you miss three or more appointments without notifying the office, you may be dismissed from the clinic at the provider's discretion.      For prescription refill requests, have your pharmacy contact our office and allow 72 hours for refills to be completed.    Today you received the following chemotherapy and/or immunotherapy agents: Bevacizumab , Leucovorin , Fluorouracil      To help prevent nausea and vomiting after your treatment, we encourage you to take your nausea medication as directed.  BELOW ARE SYMPTOMS THAT SHOULD BE REPORTED IMMEDIATELY: *FEVER GREATER THAN 100.4 F (38 C) OR HIGHER *CHILLS OR SWEATING *NAUSEA AND VOMITING THAT IS NOT CONTROLLED WITH YOUR NAUSEA MEDICATION *UNUSUAL SHORTNESS OF BREATH *UNUSUAL BRUISING OR BLEEDING *URINARY PROBLEMS (pain or burning when urinating, or frequent urination) *BOWEL PROBLEMS (unusual diarrhea, constipation, pain near the anus) TENDERNESS IN MOUTH AND THROAT WITH OR WITHOUT PRESENCE OF ULCERS (sore throat, sores in mouth, or a toothache) UNUSUAL RASH, SWELLING OR PAIN  UNUSUAL VAGINAL DISCHARGE OR ITCHING   Items with * indicate a potential emergency and should be followed up as soon as possible or go to the Emergency Department if any problems should occur.  Please show the CHEMOTHERAPY  ALERT CARD or IMMUNOTHERAPY ALERT CARD at check-in to the Emergency Department and triage nurse.  Should you have questions after your visit or need to cancel or reschedule your appointment, please contact [facility]  Dept: [phone]  and follow the prompts.  Office hours are 8:00 a.m. to 4:30 p.m. Monday - Friday. Please note that voicemails left after 4:00 p.m. may not be returned until the following business day.  We are closed weekends and major holidays. You have access to a nurse at all times for urgent questions. Please call the main number to the clinic Dept: [phone] and follow the prompts.   For any non-urgent questions, you may also contact your provider using MyChart. We now offer e-Visits for anyone 40 and older to request care online for non-urgent symptoms. For details visit [email or web address].   Also download the MyChart app! Go to the app store, search MyChart, open the app, select , and log in with your MyChart username and password.

## 2024-04-08 NOTE — Progress Notes (Signed)
 [facility]   [phone] [phone]   Clinic Follow up Note   Patient Care Team: [name], MD as PCP - General (Family Medicine) [name], MD as Consulting Physician (General Surgery) [name], MD as Consulting Physician (Hematology) [name], MD as Consulting Physician (General Surgery) [name], MD as Consulting Physician (Hematology and Oncology) [facility], [facility]   Date of Service:  04/08/2024  CHIEF COMPLAINT: f/u of metastatic colon cancer  CURRENT THERAPY:  Maintenance 5-FU pump infusion and bevacizumab  every 2 weeks  Oncology History   Malignant neoplasm of ascending colon (HCC) pT3N2aM0 stage IIB, MSS, metastatic to b/l ovaries and peritoneum and liver, KRAS mutation G12V (+)  -Initially diagnosed in 08/2019, s/p resection with Dr Debby on 11/01/19. Path showed overall Stage IIIB cancer.  -s/p 6 months adjuvant FOLFOX, completed 06/01/20, oxaliplatin  held for final 2 cycles due to neuropathy.  -s/p BSO and hysterectomy on 12/22/21 with Dr. Viktoria. Path revealed metastatic moderately differentiated colonic adenocarcinoma involving both ovaries and peritoneum -she restarted FOLFOX on 01/24/22. -Due to disease progression, chemotherapy changed to second line FOLFIRI and bevacizumab  on April 06, 2022. She has been tolerating moderately well, better now with dose reduction, will continue -Restaging CT scan from 09/27/2022 showed continous response in liver and peritoneal metastasis, no other new lesions.   -We discussed maintenance therapy option in future  -due to her PE in 10/2022, will hold beva for 3 months  -restaging CT 12/31/2022 showed stable disease and live met is not well defined on CT. She restarted beva on 01/17/23  -restaging CT 06/19/2023 showed overall stable disease (stable small lung nodules, no other evidence of cancer) Restaging CT 09/12/2023 showed no residual radiographic  evidence of disease.   - We discussed option of maintenance therapy, treatment changed to capecitabine  and bevacizumab  on 10/02/2023.  Due to significant fatigue, I changed capecitabine  to 5-FU pump infusion in November 2025. -CT 12/18/23 and 03/08/2024 showed stable disease  Assessment & Plan Metastatic colon cancer involving liver and peritoneum Metastatic colon cancer with previous liver and peritoneum involvement. Recent CT scan shows no significant findings, suggesting stable disease. Current treatment with bevacizumab  is well-tolerated with minimal skin and oral side effects. She prefers to skip colonoscopy due to inconvenience and personal preference. - Continue bevacizumab  treatment - Proceed with MRI of the brain to evaluate new headache symptoms and rule out brain metastasis - Discussed routine colonoscopy with gastroenterologist and decide based on personal preference  Left frontal headache New onset left frontal headache for the past two weeks, occurring every morning and relieved by ibuprofen. No associated vision changes, diplopia, weakness, or speech issues. No prior brain imaging is available. - Ordered MRI of the brain to evaluate headache etiology  Impaired vision and epiphora Reports of watering eyes and difficulty focusing, especially in low light conditions. No acute changes noted during examination. - Advised use of light at night to aid vision   Plan - Due to her new onset of persistent headaches, I ordered a brain MRI with and without contrast to be done in the next week - Lab reviewed, adequate for treatment, will proceed to treatment today - Follow-up in 2 weeks before next cycle chemo  SUMMARY OF ONCOLOGIC HISTORY: Oncology History Overview Note   Cancer Staging  Malignant neoplasm of female breast [facility]) Staging form: Breast, AJCC 7th Edition - Clinical stage from 06/11/2014: Stage 0 (Tis (DCIS), N0, M0) - Unsigned Staged by: Pathologist and managing  physician Laterality: Left Estrogen receptor status: Positive Progesterone receptor status: Positive Stage used in treatment planning: Yes National guidelines used in treatment planning: Yes Type of national guideline used in treatment planning: NCCN - Pathologic stage from 07/03/2014: Stage Unknown (Tis (DCIS), NX, cM0) - Signed by Guinevere Chew, MD on 07/10/2014 Staged by: Pathologist Laterality: Left Estrogen receptor status: Positive Progesterone receptor status: Positive Stage used in treatment planning: Yes National guidelines used in treatment planning: Yes Type of national guideline used in treatment planning: NCCN Staging comments: Staged on final lumpectomy specimen by Dr. Loral.  Right colon cancer Staging form: Colon and Rectum, AJCC 8th Edition - Pathologic stage from 11/01/2019: Stage IIIB (pT3, pN2a, cM0) - Signed by [name], MD on 11/29/2019 Stage prefix: Initial diagnosis Histologic grading system: 4 grade system Histologic grade (G): G2 Residual tumor (R): R0 - None Tumor deposits (TD): Absent Perineural invasion (PNI): Absent Microsatellite instability (MSI): Stable KRAS mutation: Unknown NRAS mutation: Unknown BRAF mutation: Unknown     Malignant neoplasm of female breast (HCC) (Resolved)  05/29/2014 Initial Biopsy   Left breast needle core biopsy: Grade 2, DCIS with calcs. ER+ (100%), PR+ (96%).    06/04/2014 Initial Diagnosis   Left breast DCIS with calcifications, ER 100%, PR 96%   06/10/2014 Breast MRI   Left breast: 2.4 x 1.3 x 1.1 cm area of patchy non-mass enhancement upper outer quadrant includes postbiopsy seroma; Right breast: 1.2 cm previously biopsied stable benign fibroadenoma   06/12/2014 Procedure   Genetic counseling/testing: Identified 1 VUS on CHEK2 gene. Remainder of 17 gene panel tested negative and included: ATM, BARD1, BRCA 1/2, BRIP1, CDH1, CHEK2, EPCAM, MLH1, MSH2, MSH6, NBN, NF1, PALB2, PTEN, RAD50, RAD51C, RAD51D, STK11, and TP53.     07/01/2014 Surgery   Left breast lumpectomy ([facility]): Grade 1, DCIS, spanning 2.3 cm, 1 mm margin, ER 100%, PR 96%   07/31/2014 - 08/28/2014 Radiation Therapy   Adjuvant RT completed Signe). Left breast: Total dose 42.5 Gy over 17 fractions. Left breast boost: Total dose 7.5 Gy over 3 fractions.    09/14/2014 - 01/13/2020 Anti-estrogen oral therapy   Anastrazole 1mg  daily. Planned duration of treatment: 5 years ([place]). Completed in 01/2020.    09/25/2014 Survivorship   Survivorship Care Plan given to patient and reviewed with her in person.    03/02/2021 Imaging   CT CAP  IMPRESSION: 1. No findings of active/recurrent malignancy. Partial right hemicolectomy. 2. Endometrial stripe remains mildly thickened, but endometrial biopsy in May was negative for malignancy. 3. Progressive endplate sclerosis and endplate irregularity at T2-3, probably due to degenerative endplate findings. If the has referable upper thoracic pain/symptoms then thoracic spine MRI could be used for further workup. 4. Other imaging findings of potential clinical significance: Mild cardiomegaly. Aortic Atherosclerosis (ICD10-I70.0). Mild mitral valve calcification. Postoperative findings in the left breast with adjacent radiation port anteriorly in the left upper lobe. Tiny pulmonary nodules in the left lower lobe are unchanged from earliest available comparison of 10/17/2019 and probably benign although may merit surveillance. Multilevel lumbar impingement. Mild pelvic floor laxity.   Malignant neoplasm of ascending colon (HCC)  09/19/2019 Imaging   CT AP W contrast 09/19/19  IMPRESSION Fullness in the cecum, cannot exclude a mass. No evidence for metastatic disease is identified.    09/24/2019 Procedure   Colonoscopy by Dr Ernesto 09/24/19 IMPRESSION 1. The colon was redundant  2. Mild diverticulosis was noted through the entire examined colon 3. Single 12mm polyp was found in the ascending colon; polypectomy  was performed using snare cautery and biopsy forceps 4. Mild diverticulosis was notes in the descending colon and sigmoid colon.  5. Single polyp was found in the sigmoid colon, polypectomy was performed with cold forceps.  6. Single polyp was found in the rectosigmoid colon; polypectomy was performed with cold snare  7. Small internal hemorrhoids  8. Large mass was found at the cecum; multiple biopsies of the area were performed using cold forceps; injection (tattooing) was performed distal to the mass.    09/24/2019 Initial Biopsy   INTERPRETATION AND DIAGNOSIS:  A. Cecum, biopsy:  Invasive moderately differentiated adenocarcinoma.  see comment  B. Polyp @ ascending colon, polypectomy:  Tubular Adenoma  C. Polyp @ sigmoid colon Polypectomy:  hyperplastic polyp.  D. Polyp @ rectosigmoid colon, Polypectomy:  Hyperplastic Polyp      10/16/2019 Imaging   CT Chest IMPRESSION: 1. Multiple small pulmonary nodules measuring 5 mm or less in size in the lungs. These are nonspecific and are typically considered statistically likely benign. However, given the patient's history of primary malignancy, close attention on follow-up studies is recommended to ensure stability. 2. Aortic atherosclerosis, in addition to right coronary artery disease. Assessment for potential risk factor modification, dietary therapy or pharmacologic therapy may be warranted, if clinically indicated. 3. There are calcifications of the aortic valve and mitral annulus. Echocardiographic correlation for evaluation of potential valvular dysfunction may be warranted if clinically indicated. 4. Small hiatal hernia.   Aortic Atherosclerosis (ICD10-I70.0).   11/01/2019 Initial Diagnosis   Colon cancer (HCC)   11/01/2019 Surgery   LAPAROSCOPIC PARTIAL COLECTOMY by Dr Debby and Dr Sheldon   11/01/2019 Pathology Results   FINAL MICROSCOPIC DIAGNOSIS:   A. COLON, PROXIMAL RIGHT, COLECTOMY:  - Invasive colonic  adenocarcinoma, 5 cm.  - Tumor invades through the muscularis propria into pericolonic tissues.   - Margins of resection are not involved.  - Metastatic carcinoma in (5) of (13) lymph nodes.  - See oncology table.    MSI Stable  Mismatch repair normal  MLH1 - Preserved nuclear expression (greater 50% tumor expression) MSH2 - Preserved nuclear expression (greater 50% tumor expression) MSH6 - Preserved nuclear expression (greater 50% tumor expression) PMS2 - Preserved nuclear expression (greater 50% tumor expression)   11/01/2019 Cancer Staging   Staging form: Colon and Rectum, AJCC 8th Edition - Pathologic stage from 11/01/2019: Stage IIIB (pT3, pN2a, cM0) - Signed by [name], MD on 11/29/2019   12/10/2019 Procedure   PAC placed 12/10/19   12/17/2019 - 06/01/2020 Chemotherapy   FOLFOX q2weeks starting in 2 weeks starting 12/17/19. Held 01/27/20-02/10/20 due to b/l PE. Oxaliplatin  held C11-12 due to neuropathy. Completed on 06/01/20   03/02/2021 Imaging   CT CAP  IMPRESSION: 1. No findings of active/recurrent malignancy. Partial right hemicolectomy. 2. Endometrial stripe remains mildly thickened, but endometrial biopsy in May was negative for malignancy. 3. Progressive endplate sclerosis and endplate irregularity at T2-3, probably due to degenerative endplate findings. If the has referable upper thoracic pain/symptoms then thoracic spine MRI could be used for further workup. 4. Other imaging findings of potential clinical significance: Mild cardiomegaly. Aortic Atherosclerosis (ICD10-I70.0). Mild mitral valve calcification. Postoperative findings in the left breast with adjacent radiation port anteriorly in the left upper lobe. Tiny pulmonary nodules in the left lower lobe are unchanged from earliest available comparison of 10/17/2019 and probably benign although may merit surveillance. Multilevel lumbar impingement. Mild pelvic floor laxity.   08/26/2021 Imaging   EXAM: CT CHEST,  ABDOMEN, AND PELVIS WITH  CONTRAST  IMPRESSION: 1. Stable examination without new or progressive findings to suggest local recurrence or metastatic disease within the chest, abdomen, or pelvis. 2. Hepatomegaly with hepatic steatosis. 3. Sigmoid colonic diverticulosis without findings of acute diverticulitis. 4. Similar prominent endplate sclerosis and irregularity at T2-T3 is most consistent with Modic type endplate degenerative changes. However, if patient has referable upper thoracic pain consider further workup with thoracic spine MRI. 5. Similar thickening of the endometrial stripe measuring up to 8 mm, which was previously biopsied with results negative for malignancy. 6. Aortic Atherosclerosis (ICD10-I70.0).   11/10/2021 PET scan   IMPRESSION: 1. LEFT ovary is increased in size and is intensely hypermetabolic. While physiologic hypermetabolic ovarian tissue is not uncommon, the enlargement and asymmetric activity warrants further evaluation. Consider contrast pelvic MRI vs tissue sampling. 2. No evidence of metastatic colorectal carcinoma otherwise. 3. Post RIGHT hemicolectomy anatomy. 4. Evidence of radiation change in the LEFT upper lobe (remote breast cancer).     12/22/2021 Relapse/Recurrence    FINAL MICROSCOPIC DIAGNOSIS:   A. LEFT OVARY AND FALLOPIAN TUBE, SALPINGO OOPHORECTOMY:  - Metastatic moderately differentiated colonic adenocarcinoma involving left ovary  - Focal ovarian stromal calcification  - Segment of benign fallopian tube   B. UTERUS WITH RIGHT FALLOPIAN TUBE AND OVARY, HYSTERECTOMY AND SALPINGO-OOPHORECTOMY:  - Metastatic moderately differentiated colonic adenocarcinoma involving right ovary  - Focal invasive extensive adenomyosis  - Benign endometrial polyps  - Benign proliferative phase endometrium  - Hydrosalpinx of right fallopian tube  - Focal ovarian stromal calcification   C. PERITONEAL DEPOSITS, ANTERIOR CUL DE SAC, BIOPSY:  - Metastatic  mucinous adenocarcinoma, consistent with colorectal primary    COMMENT:  Immunohistochemical stains show that the tumor cells are positive for CK20 and CDX2 while they are negative for CK7 and PAX8, consistent with above interpretation.    01/24/2022 - 03/23/2022 Chemotherapy   Patient is on Treatment Plan : COLORECTAL FOLFOX q14d x 3 months     04/01/2022 Imaging   CT AP IMPRESSION: 1. New omental metastatic disease. 2. Vague hypoattenuating lesion in the inferior right hepatic lobe is new and also worrisome for metastatic disease. 3. Similar small right lower lobe nodules. Recommend continued attention on follow-up. 4. Steatotic enlarged liver. 5.  Aortic atherosclerosis (ICD10-I70.0).   04/06/2022 - 09/18/2023 Chemotherapy   Patient is on Treatment Plan : COLORECTAL FOLFIRI + Bevacizumab  q14d     04/21/2022 Imaging    IMPRESSION: 1. Stable chest CT. No evidence of metastatic disease. 2. Stable small pulmonary nodules, considered benign based on stability. 3. Stable postsurgical changes in the left breast and anterior left upper lobe radiation changes. 4. Aortic Atherosclerosis (ICD10-I70.0) and Emphysema (ICD10-J43.9).   04/21/2022 Imaging    IMPRESSION: 1. Small enhancing lesion inferiorly in the right hepatic lobe is typical of metastatic disease and stable from recent abdominal CT. No other definite liver lesions are identified. Mild hepatic steatosis. 2. No other significant abdominal findings. 3. Omental nodularity seen on CT is not well visualized by MRI.    Imaging     06/23/2022 Imaging    IMPRESSION: 1. Hypodense lesion of the inferior right lobe of the liver, hepatic segment VI is diminished in size, consistent with treatment response. 2. Tiny peritoneal and omental nodules identified by prior examination are diminished in size, consistent with treatment response. 3. Multiple tiny pulmonary nodules unchanged, most likely benign and incidental, however  continued attention on follow-up warranted in the setting of known metastatic disease. 4. No evidence of new metastatic  disease in the chest, abdomen, or pelvis. 5. Status post partial right hemicolectomy and reanastomosis. 6. Diffuse mosaic attenuation of the airspaces, consistent with small airways disease. 7. Hepatomegaly.   10/02/2023 - 02/26/2024 Chemotherapy   Patient is on Treatment Plan : COLORECTAL Bevacizumab  q21d     12/18/2023 Imaging   CT CAP with contrast IMPRESSION: 1. Prior partial right hemicolectomy with ileocolic anastomosis. No suspicious nodularity along the suture line. Prominent adjacent lymph nodes are similar to prior. 2. Stable tiny scattered pulmonary nodules. 3. No evidence of new or progressive disease in the chest, abdomen or pelvis. 4. Hepatomegaly with hepatic steatosis. 5. Aortic atherosclerosis.     03/11/2024 -  Chemotherapy   Patient is on Treatment Plan : COLORECTAL 5FU + Leucovorin  (Modified [place]ramont) + Bevacizumab  q14d        Discussed the use of AI scribe software for clinical note transcription with the patient, who gave verbal consent to proceed.  History of Present Illness Kim Castro is a 78 year old female with metastatic colon cancer who presents for follow-up.  She has had new headaches for 2 weeks, occurring every morning and improving by afternoon or evening. Pain is localized to the left forehead and sometimes feels retro-orbital. Ibuprofen relieves the pain, while Tylenol  does not. She usually takes two ibuprofen in the morning and up to four by afternoon. She has no prior similar headaches, and no double vision, weakness, numbness, or speech difficulty. She notes increased tearing, intermittent stickiness of the left eye on opening after rest, balance problems worse in the dark, and slower focusing of her eyes.  Her current cancer treatment is bevacizumab  and MFFU. She is no longer on the oral chemotherapy pill. She has  peritoneal and liver metastases.  She reports an insurance denial for an injection related to her gastroenterology care and plans to follow up with her insurance company.     All other systems were reviewed with the patient and are negative.  MEDICAL HISTORY:  Past Medical History:  Diagnosis Date   Aortic atherosclerosis    Arthritis    feet, lower back   Basal cell carcinoma    arm   BRCA gene mutation negative    Breast cancer of upper-outer quadrant of left female breast (HCC) 06/04/2014   Cataract    immature on the left   Colon cancer (HCC) 08/2019   Diabetes mellitus without complication (HCC)    Diverticulosis    Dizziness    > 61yrs ago;took Antivert    Family history of anesthesia complication    sister slow to wake up with anesthesia   Family history of breast cancer    Family history of colon cancer    Family history of uterine cancer    GERD (gastroesophageal reflux disease)    takes occasional TUMs   History of bronchitis    > 26yrs ago   History of colon polyps    History of hiatal hernia    Small noted on CT   History of pulmonary embolus (PE)    Hypertension    takes Losartan  daily and HCTZ   Iron deficiency anemia    Joint pain    Metastatic disease (HCC) 2023   peritoneum and liver mets   Numbness    to toes on each foot   Peripheral neuropathy    feet and toes   Personal history of radiation therapy    Pulmonary nodules    Noted on CT   Radiation 07/31/14-08/28/14  Left Breast 20 fxs   Seasonal allergies    takes Claritin prn   Urinary frequency    Vitamin D deficiency    takes VIt D daily    SURGICAL HISTORY: Past Surgical History:  Procedure Laterality Date   BREAST BIOPSY Bilateral    BREAST LUMPECTOMY Left    BREAST LUMPECTOMY WITH RADIOACTIVE SEED LOCALIZATION Left 07/01/2014   Procedure: LEFT BREAST LUMPECTOMY WITH RADIOACTIVE SEED LOCALIZATION;  Surgeon: Morene Olives, MD;  Location: [facility];  Service:  General;  Laterality: Left;   CATARACT EXTRACTION Right    COLONOSCOPY  09/24/2019   [place]   LAPAROSCOPIC PARTIAL COLECTOMY N/A 11/01/2019   Procedure: LAPAROSCOPIC PARTIAL COLECTOMY;  Surgeon: [name], MD;  Location: [facility];  Service: General;  Laterality: N/A;   POLYPECTOMY     PORTACATH PLACEMENT N/A 12/10/2019   Procedure: INSERTION PORT-A-CATH ULTRASOUND GUIDED IN RIGHT IJ;  Surgeon: [name], MD;  Location: [facility];  Service: General;  Laterality: N/A;   ROBOTIC ASSISTED TOTAL HYSTERECTOMY Bilateral 12/22/2021   Procedure: XI ROBOTIC ASSISTED TOTAL HYSTERECTOMY WITH BILATERAL SALPINGO OOPHORECTOMY;  Surgeon: Viktoria Comer SAUNDERS, MD;  Location: [facility];  Service: Gynecology;  Laterality: Bilateral;   TOTAL KNEE ARTHROPLASTY Left 10/24/2012   Procedure: TOTAL KNEE ARTHROPLASTY;  Surgeon: Dempsey JINNY Sensor, MD;  Location: [facility];  Service: Orthopedics;  Laterality: Left;   TOTAL KNEE ARTHROPLASTY Right 01/09/2013   Procedure: TOTAL KNEE ARTHROPLASTY;  Surgeon: Dempsey JINNY Sensor, MD;  Location: [facility];  Service: Orthopedics;  Laterality: Right;   TUBAL LIGATION      I have reviewed the social history and family history with the patient and they are unchanged from previous note.  ALLERGIES:  is allergic to oxycodone .  MEDICATIONS:  Current Outpatient Medications  Medication Sig Dispense Refill   b complex vitamins capsule Take 1 capsule by mouth daily.     Cholecalciferol (VITAMIN D) 2000 UNITS CAPS Take 2,000 Units by mouth daily.      ELIQUIS  5 MG TABS tablet Take 1 tablet by mouth twice daily 60 tablet 0   famotidine (PEPCID) 20 MG tablet Take 20 mg by mouth daily.     ibuprofen (ADVIL) 200 MG tablet Take 200 mg by mouth daily as needed (pain).     lidocaine -prilocaine  (EMLA ) cream Apply to affected area once 30 g 3   loratadine (CLARITIN) 10 MG tablet Take 10 mg by mouth daily as needed for allergies.     losartan  (COZAAR ) 25 MG tablet Take 1 tablet by mouth once daily 90  tablet 2   magic mouthwash (nystatin , lidocaine , diphenhydrAMINE , alum & mag hydroxide) suspension Swish and swallow 4 times daily as needed for mouth pain. 140 mL 1   metFORMIN  (GLUCOPHAGE ) 500 MG tablet TAKE 1 TABLET BY MOUTH IN THE MORNING AND AT BEDTIME 180 tablet 3   Multiple Vitamins-Minerals (MULTIVITAMIN WITH MINERALS) tablet Take 1 tablet by mouth daily.     ondansetron  (ZOFRAN ) 8 MG tablet Take 1 tablet (8 mg total) by mouth every 8 (eight) hours as needed for nausea or vomiting. 30 tablet 1   potassium chloride  (KLOR-CON ) 10 MEQ tablet Take 1 tablet (10 mEq total) by mouth daily. 60 tablet 0   pravastatin  (PRAVACHOL ) 10 MG tablet Take 1 tablet by mouth once daily 90 tablet 3   pregabalin  (LYRICA ) 25 MG capsule TAKE 1 CAPSULE BY MOUTH ONCE DAILY IN ADDITION TO 50 MG  AT BEDTIME 30 capsule 1   pregabalin  (LYRICA )  50 MG capsule TAKE 1 CAPSULE BY MOUTH TWICE DAILY IN ADDITION TO THE 25 MG CAPSULE AT BEDTIME FOR A TOTAL OF 75 MG AT BEDTIME. 60 capsule 0   prochlorperazine  (COMPAZINE ) 10 MG tablet Take 1 tablet (10 mg total) by mouth every 6 (six) hours as needed for nausea or vomiting. 30 tablet 1   traMADol  (ULTRAM ) 50 MG tablet Take 1 tablet (50 mg total) by mouth every 6 (six) hours as needed. 30 tablet 0   triamcinolone  ointment (KENALOG ) 0.5 % Apply 1 Application topically 2 (two) times daily. 30 g 0   No current facility-administered medications for this visit.   Facility-Administered Medications Ordered in Other Visits  Medication Dose Route Frequency Provider Last Rate Last Admin   0.9 %  sodium chloride  infusion   Intravenous Continuous [name], MD   Stopped at 04/08/24 1152   fluorouracil  (ADRUCIL ) 5,000 mg in sodium chloride  0.9 % 150 mL chemo infusion  2,400 mg/m2 (Treatment Plan Recorded) Intravenous 1 day or 1 dose [name], MD   Infusion Verify at 04/08/24 1209    PHYSICAL EXAMINATION: ECOG PERFORMANCE STATUS: 1 - Symptomatic but completely ambulatory  Vitals:    04/08/24 0931 04/08/24 0935  BP: (!) 190/71 (!) 142/82  Pulse: 65   Resp:    Temp:    SpO2: 98%    Wt Readings from Last 3 Encounters:  04/08/24 222 lb (100.7 kg)  03/25/24 220 lb 1.6 oz (99.8 kg)  03/12/24 220 lb 9.6 oz (100.1 kg)     GENERAL:alert, no distress and comfortable SKIN: skin color, texture, turgor are normal, no rashes or significant lesions EYES: normal, Conjunctiva are pink and non-injected, sclera clear NECK: supple, thyroid normal size, non-tender, without nodularity LYMPH:  no palpable lymphadenopathy in the cervical, axillary  LUNGS: clear to auscultation and percussion with normal breathing effort HEART: regular rate & rhythm and no murmurs and no lower extremity edema ABDOMEN:abdomen soft, non-tender and normal bowel sounds Musculoskeletal:no cyanosis of digits and no clubbing  NEURO: alert & oriented x 3 with fluent speech, no focal motor/sensory deficits  Physical Exam    LABORATORY DATA:  I have reviewed the data as listed    Latest Ref Rng & Units 04/08/2024    9:00 AM 03/25/2024    7:40 AM 03/11/2024   12:31 PM  CBC  WBC 4.0 - 10.5 K/uL 6.5  5.0  6.6   Hemoglobin 12.0 - 15.0 g/dL 87.8  88.3  88.9   Hematocrit 36.0 - 46.0 % 36.4  34.7  32.7   Platelets 150 - 400 K/uL 171  131  223         Latest Ref Rng & Units 04/08/2024    9:00 AM 03/25/2024    7:40 AM 03/12/2024    8:52 AM  CMP  Glucose 70 - 99 mg/dL 888  895  894   BUN 8 - 23 mg/dL 22  16    Creatinine 9.55 - 1.00 mg/dL 9.25  9.29    Sodium 864 - 145 mmol/L 143  144    Potassium 3.5 - 5.1 mmol/L 4.4  4.4    Chloride 98 - 111 mmol/L 108  109    CO2 22 - 32 mmol/L 25  26    Calcium  8.9 - 10.3 mg/dL 9.7  9.5    Total Protein 6.5 - 8.1 g/dL 6.8  6.6    Total Bilirubin 0.0 - 1.2 mg/dL 1.3  1.4    Alkaline Phos 38 -  126 U/L 91  84    AST 15 - 41 U/L 37  31    ALT 0 - 44 U/L 26  18        RADIOGRAPHIC STUDIES: I have personally reviewed the radiological images as listed and  agreed with the findings in the report. No results found.    Orders Placed This Encounter  Procedures   MR Brain W Wo Contrast    Standing Status:   Future    Expected Date:   04/11/2024    Expiration Date:   04/08/2025    If indicated for the ordered procedure, I authorize the administration of contrast media per Radiology protocol:   Yes    What is the patient's sedation requirement?:   No Sedation    Does the patient have a pacemaker or implanted devices?:   No    Use SRS Protocol?:   No    Preferred imaging location?:   [facility] (table limit - 500lbs)   All questions were answered. The patient knows to call the clinic with any problems, questions or concerns. No barriers to learning was detected. The total time spent in the appointment was 25 minutes, including review of chart and various tests results, discussions about plan of care and coordination of care plan     Onita Mattock, MD 04/08/2024

## 2024-04-10 ENCOUNTER — Inpatient Hospital Stay

## 2024-04-15 ENCOUNTER — Ambulatory Visit (HOSPITAL_COMMUNITY): Admission: RE | Admit: 2024-04-15 | Discharge: 2024-04-15 | Attending: Hematology

## 2024-04-15 ENCOUNTER — Other Ambulatory Visit: Payer: Self-pay | Admitting: Nurse Practitioner

## 2024-04-15 DIAGNOSIS — C182 Malignant neoplasm of ascending colon: Secondary | ICD-10-CM

## 2024-04-15 DIAGNOSIS — R200 Anesthesia of skin: Secondary | ICD-10-CM

## 2024-04-15 DIAGNOSIS — R519 Headache, unspecified: Secondary | ICD-10-CM | POA: Diagnosis not present

## 2024-04-15 MED ORDER — GADOBUTROL 1 MMOL/ML IV SOLN
10.0000 mL | Freq: Once | INTRAVENOUS | Status: AC | PRN
  Administered 2024-04-15: 13:00:00 10 mL via INTRAVENOUS

## 2024-04-16 ENCOUNTER — Encounter: Payer: Self-pay | Admitting: Hematology

## 2024-04-21 NOTE — Assessment & Plan Note (Signed)
 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 Path showed overall Stage IIIB cancer.  -s/p 6 months adjuvant FOLFOX, completed 06/01/20, oxaliplatin  held for final 2 cycles due to neuropathy.  -s/p BSO and hysterectomy on 12/22/21 with Dr. Viktoria. Path revealed metastatic moderately differentiated colonic adenocarcinoma involving both ovaries and peritoneum -she restarted FOLFOX on 01/24/22. -Due to disease progression, chemotherapy changed to second line FOLFIRI and bevacizumab  on April 06, 2022. She has been tolerating moderately well, better now with dose reduction, will continue -Restaging CT scan from 09/27/2022 showed continous response in liver and peritoneal metastasis, no other new lesions.   -We discussed maintenance therapy option in future  -due to her PE in 10/2022, will hold beva for 3 months  -restaging CT 12/31/2022 showed stable disease and live met is not well defined on CT. She restarted beva on 01/17/23  -restaging CT 06/19/2023 showed overall stable disease (stable small lung nodules, no other evidence of cancer) Restaging CT 09/12/2023 showed no residual radiographic evidence of disease.   - We discussed option of maintenance therapy, treatment changed to capecitabine  and bevacizumab  on 10/02/2023.  Due to significant fatigue, I changed capecitabine  to 5-FU pump infusion in November 2025. -CT 12/18/23 and 03/08/2024 showed stable disease

## 2024-04-22 ENCOUNTER — Inpatient Hospital Stay

## 2024-04-22 ENCOUNTER — Inpatient Hospital Stay: Admitting: Hematology

## 2024-04-22 VITALS — BP 150/69 | HR 66 | Temp 98.4°F | Resp 17 | Wt 218.3 lb

## 2024-04-22 DIAGNOSIS — C182 Malignant neoplasm of ascending colon: Secondary | ICD-10-CM | POA: Diagnosis not present

## 2024-04-22 DIAGNOSIS — Z5111 Encounter for antineoplastic chemotherapy: Secondary | ICD-10-CM | POA: Diagnosis not present

## 2024-04-22 LAB — CMP (CANCER CENTER ONLY)
ALT: 12 U/L (ref 0–44)
AST: 22 U/L (ref 15–41)
Albumin: 4.4 g/dL (ref 3.5–5.0)
Alkaline Phosphatase: 82 U/L (ref 38–126)
Anion gap: 9 (ref 5–15)
BUN: 24 mg/dL — ABNORMAL HIGH (ref 8–23)
CO2: 26 mmol/L (ref 22–32)
Calcium: 9.6 mg/dL (ref 8.9–10.3)
Chloride: 108 mmol/L (ref 98–111)
Creatinine: 0.95 mg/dL (ref 0.44–1.00)
GFR, Estimated: >60 mL/min
Glucose, Bld: 116 mg/dL — ABNORMAL HIGH (ref 70–99)
Potassium: 4.7 mmol/L (ref 3.5–5.1)
Sodium: 144 mmol/L (ref 135–145)
Total Bilirubin: 1.1 mg/dL (ref 0.0–1.2)
Total Protein: 7.1 g/dL (ref 6.5–8.1)

## 2024-04-22 LAB — CBC WITH DIFFERENTIAL (CANCER CENTER ONLY)
Abs Immature Granulocytes: 0.02 K/uL (ref 0.00–0.07)
Basophils Absolute: 0 K/uL (ref 0.0–0.1)
Basophils Relative: 1 %
Eosinophils Absolute: 0.2 K/uL (ref 0.0–0.5)
Eosinophils Relative: 2 %
HCT: 37.9 % (ref 36.0–46.0)
Hemoglobin: 12.7 g/dL (ref 12.0–15.0)
Immature Granulocytes: 0 %
Lymphocytes Relative: 35 %
Lymphs Abs: 2.3 K/uL (ref 0.7–4.0)
MCH: 35.5 pg — ABNORMAL HIGH (ref 26.0–34.0)
MCHC: 33.5 g/dL (ref 30.0–36.0)
MCV: 105.9 fL — ABNORMAL HIGH (ref 80.0–100.0)
Monocytes Absolute: 0.6 K/uL (ref 0.1–1.0)
Monocytes Relative: 8 %
Neutro Abs: 3.5 K/uL (ref 1.7–7.7)
Neutrophils Relative %: 54 %
Platelet Count: 181 K/uL (ref 150–400)
RBC: 3.58 MIL/uL — ABNORMAL LOW (ref 3.87–5.11)
RDW: 14.1 % (ref 11.5–15.5)
WBC Count: 6.6 K/uL (ref 4.0–10.5)
nRBC: 0 % (ref 0.0–0.2)

## 2024-04-22 MED ORDER — SODIUM CHLORIDE 0.9 % IV SOLN
2400.0000 mg/m2 | INTRAVENOUS | Status: DC
  Administered 2024-04-22: 5000 mg via INTRAVENOUS
  Filled 2024-04-22: qty 100

## 2024-04-22 MED ORDER — SODIUM CHLORIDE 0.9 % IV SOLN: INTRAVENOUS | Status: DC

## 2024-04-22 MED ORDER — PROCHLORPERAZINE MALEATE 10 MG PO TABS
10.0000 mg | ORAL_TABLET | Freq: Once | ORAL | Status: AC
  Administered 2024-04-22: 10 mg via ORAL
  Filled 2024-04-22: qty 1

## 2024-04-22 MED ORDER — SODIUM CHLORIDE 0.9 % IV SOLN
5.0000 mg/kg | Freq: Once | INTRAVENOUS | Status: AC
  Administered 2024-04-22: 500 mg via INTRAVENOUS
  Filled 2024-04-22: qty 4

## 2024-04-22 MED ORDER — SODIUM CHLORIDE 0.9 % IV SOLN
400.0000 mg/m2 | Freq: Once | INTRAVENOUS | Status: AC
  Administered 2024-04-22: 856 mg via INTRAVENOUS
  Filled 2024-04-22: qty 42.8

## 2024-04-22 NOTE — Progress Notes (Signed)
 " [facility]   [phone] Fax:([phone]   Clinic Follow up Note   Patient Care Team: [name], MD as PCP - General (Family Medicine) [name], MD as Consulting Physician (General Surgery) [name], MD as Consulting Physician (Hematology) [name], MD as Consulting Physician (General Surgery) [name], MD as Consulting Physician (Hematology and Oncology) [facility], [facility]   Date of Service:  04/22/2024  CHIEF COMPLAINT: f/u of metastatic colon cancer  CURRENT THERAPY:  Maintenance 5-FU pump infusion and bevacizumab   Oncology History   Malignant neoplasm of ascending colon (HCC) pT3N2aM0 stage IIB, MSS, metastatic to b/l ovaries and peritoneum and liver, KRAS mutation G12V (+)  -Initially diagnosed in 08/2019, s/p resection with Dr Debby on 11/01/19. Path showed overall Stage IIIB cancer.  -s/p 6 months adjuvant FOLFOX, completed 06/01/20, oxaliplatin  held for final 2 cycles due to neuropathy.  -s/p BSO and hysterectomy on 12/22/21 with Dr. Viktoria. Path revealed metastatic moderately differentiated colonic adenocarcinoma involving both ovaries and peritoneum -she restarted FOLFOX on 01/24/22. -Due to disease progression, chemotherapy changed to second line FOLFIRI and bevacizumab  on April 06, 2022. She has been tolerating moderately well, better now with dose reduction, will continue -Restaging CT scan from 09/27/2022 showed continous response in liver and peritoneal metastasis, no other new lesions.   -We discussed maintenance therapy option in future  -due to her PE in 10/2022, will hold beva for 3 months  -restaging CT 12/31/2022 showed stable disease and live met is not well defined on CT. She restarted beva on 01/17/23  -restaging CT 06/19/2023 showed overall stable disease (stable small lung nodules, no other evidence of cancer) Restaging CT 09/12/2023 showed no residual radiographic evidence of disease.    - We discussed option of maintenance therapy, treatment changed to capecitabine  and bevacizumab  on 10/02/2023.  Due to significant fatigue, I changed capecitabine  to 5-FU pump infusion in November 2025. -CT 12/18/23 and 03/08/2024 showed stable disease  Assessment & Plan Metastatic colon cancer She is receiving active treatment with 5-fu and bevacizumab . Recent brain MRI was unremarkable and last CT scan was performed in November. Tumor markers have shown a gradual increase but remain only slightly above normal. She is tolerating therapy without new pain or concerning symptoms. Laboratory results, including blood counts, are within normal limits. Mild burning at the port site during infusion resolved with slower infusion rate. No acute concerns identified. - Reviewed recent MRI and laboratory results; blood counts are normal. - Assessed port site burning; resolved with slower infusion rate, no acute concerns. - Ordered CT scan for early February. - Planned tumor marker assessment at next visit and every four weeks thereafter. - Scheduled oncology follow-up for January 5th and additional visits in late January/early February.  Hypertension Chronic hypertension managed with losartan  25 mg daily for over ten years. Blood pressure remains elevated at home and in clinic. No headache or chest discomfort. Hypertension may be exacerbated by bevacizumab . - Recommended increasing losartan  dose. - Advised contacting primary care physician for dose adjustment. - Discussed home blood pressure monitoring.  Chemotherapy-induced peripheral neuropathy Persistent neuropathy in hands and feet, possibly worsened by colder temperatures and ambulation on hardwood floors. - Discussed use of warm slippers for symptomatic relief.   Plan - I reviewed her brain MRI results, which is unremarkable - She is clinically doing well, will continue 5-FU and bevacizumab  maintenance therapy - Follow-up in 2 weeks, plan to  repeat restaging CT scan in February.  SUMMARY OF ONCOLOGIC HISTORY: Oncology History Overview Note   Cancer Staging  Malignant neoplasm of female breast [facility]) Staging form: Breast, AJCC 7th Edition - Clinical stage from 06/11/2014: Stage 0 (Tis (DCIS), N0, M0) - Unsigned Staged by: Pathologist and managing physician Laterality: Left Estrogen receptor status: Positive Progesterone receptor status: Positive Stage used in treatment planning: Yes National guidelines used in treatment planning: Yes Type of national guideline used in treatment planning: NCCN - Pathologic stage from 07/03/2014: Stage Unknown (Tis (DCIS), NX, cM0) - Signed by Guinevere Chew, MD on 07/10/2014 Staged by: Pathologist Laterality: Left Estrogen receptor status: Positive Progesterone receptor status: Positive Stage used in treatment planning: Yes National guidelines used in treatment planning: Yes Type of national guideline used in treatment planning: NCCN Staging comments: Staged on final lumpectomy specimen by Dr. Loral.  Right colon cancer Staging form: Colon and Rectum, AJCC 8th Edition - Pathologic stage from 11/01/2019: Stage IIIB (pT3, pN2a, cM0) - Signed by [name], MD on 11/29/2019 Stage prefix: Initial diagnosis Histologic grading system: 4 grade system Histologic grade (G): G2 Residual tumor (R): R0 - None Tumor deposits (TD): Absent Perineural invasion (PNI): Absent Microsatellite instability (MSI): Stable KRAS mutation: Unknown NRAS mutation: Unknown BRAF mutation: Unknown     Malignant neoplasm of female breast (HCC) (Resolved)  05/29/2014 Initial Biopsy   Left breast needle core biopsy: Grade 2, DCIS with calcs. ER+ (100%), PR+ (96%).    06/04/2014 Initial Diagnosis   Left breast DCIS with calcifications, ER 100%, PR 96%   06/10/2014 Breast MRI   Left breast: 2.4 x 1.3 x 1.1 cm area of patchy non-mass enhancement upper outer quadrant includes postbiopsy seroma; Right breast: 1.2 cm  previously biopsied stable benign fibroadenoma   06/12/2014 Procedure   Genetic counseling/testing: Identified 1 VUS on CHEK2 gene. Remainder of 17 gene panel tested negative and included: ATM, BARD1, BRCA 1/2, BRIP1, CDH1, CHEK2, EPCAM, MLH1, MSH2, MSH6, NBN, NF1, PALB2, PTEN, RAD50, RAD51C, RAD51D, STK11, and TP53.    07/01/2014 Surgery   Left breast lumpectomy ([facility]): Grade 1, DCIS, spanning 2.3 cm, 1 mm margin, ER 100%, PR 96%   07/31/2014 - 08/28/2014 Radiation Therapy   Adjuvant RT completed Signe). Left breast: Total dose 42.5 Gy over 17 fractions. Left breast boost: Total dose 7.5 Gy over 3 fractions.    09/14/2014 - 01/13/2020 Anti-estrogen oral therapy   Anastrazole 1mg  daily. Planned duration of treatment: 5 years ([place]). Completed in 01/2020.    09/25/2014 Survivorship   Survivorship Care Plan given to patient and reviewed with her in person.    03/02/2021 Imaging   CT CAP  IMPRESSION: 1. No findings of active/recurrent malignancy. Partial right hemicolectomy. 2. Endometrial stripe remains mildly thickened, but endometrial biopsy in May was negative for malignancy. 3. Progressive endplate sclerosis and endplate irregularity at T2-3, probably due to degenerative endplate findings. If the has referable upper thoracic pain/symptoms then thoracic spine MRI could be used for further workup. 4. Other imaging findings of potential clinical significance: Mild cardiomegaly. Aortic Atherosclerosis (ICD10-I70.0). Mild mitral valve calcification. Postoperative findings in the left breast with adjacent radiation port anteriorly in the left upper lobe. Tiny pulmonary nodules in the left lower lobe are unchanged from earliest available comparison of 10/17/2019 and probably benign although may merit surveillance. Multilevel lumbar impingement. Mild pelvic floor laxity.   Malignant neoplasm of ascending colon (HCC)  09/19/2019 Imaging   CT AP W contrast 09/19/19  IMPRESSION Fullness  in the cecum, cannot exclude a mass. No evidence for  metastatic disease is identified.    09/24/2019 Procedure   Colonoscopy by Dr Ernesto 09/24/19 IMPRESSION 1. The colon was redundant  2. Mild diverticulosis was noted through the entire examined colon 3. Single 12mm polyp was found in the ascending colon; polypectomy was performed using snare cautery and biopsy forceps 4. Mild diverticulosis was notes in the descending colon and sigmoid colon.  5. Single polyp was found in the sigmoid colon, polypectomy was performed with cold forceps.  6. Single polyp was found in the rectosigmoid colon; polypectomy was performed with cold snare  7. Small internal hemorrhoids  8. Large mass was found at the cecum; multiple biopsies of the area were performed using cold forceps; injection (tattooing) was performed distal to the mass.    09/24/2019 Initial Biopsy   INTERPRETATION AND DIAGNOSIS:  A. Cecum, biopsy:  Invasive moderately differentiated adenocarcinoma.  see comment  B. Polyp @ ascending colon, polypectomy:  Tubular Adenoma  C. Polyp @ sigmoid colon Polypectomy:  hyperplastic polyp.  D. Polyp @ rectosigmoid colon, Polypectomy:  Hyperplastic Polyp      10/16/2019 Imaging   CT Chest IMPRESSION: 1. Multiple small pulmonary nodules measuring 5 mm or less in size in the lungs. These are nonspecific and are typically considered statistically likely benign. However, given the patient's history of primary malignancy, close attention on follow-up studies is recommended to ensure stability. 2. Aortic atherosclerosis, in addition to right coronary artery disease. Assessment for potential risk factor modification, dietary therapy or pharmacologic therapy may be warranted, if clinically indicated. 3. There are calcifications of the aortic valve and mitral annulus. Echocardiographic correlation for evaluation of potential valvular dysfunction may be warranted if clinically indicated. 4. Small hiatal  hernia.   Aortic Atherosclerosis (ICD10-I70.0).   11/01/2019 Initial Diagnosis   Colon cancer (HCC)   11/01/2019 Surgery   LAPAROSCOPIC PARTIAL COLECTOMY by Dr Debby and Dr Sheldon   11/01/2019 Pathology Results   FINAL MICROSCOPIC DIAGNOSIS:   A. COLON, PROXIMAL RIGHT, COLECTOMY:  - Invasive colonic adenocarcinoma, 5 cm.  - Tumor invades through the muscularis propria into pericolonic tissues.   - Margins of resection are not involved.  - Metastatic carcinoma in (5) of (13) lymph nodes.  - See oncology table.    MSI Stable  Mismatch repair normal  MLH1 - Preserved nuclear expression (greater 50% tumor expression) MSH2 - Preserved nuclear expression (greater 50% tumor expression) MSH6 - Preserved nuclear expression (greater 50% tumor expression) PMS2 - Preserved nuclear expression (greater 50% tumor expression)   11/01/2019 Cancer Staging   Staging form: Colon and Rectum, AJCC 8th Edition - Pathologic stage from 11/01/2019: Stage IIIB (pT3, pN2a, cM0) - Signed by [name], MD on 11/29/2019   12/10/2019 Procedure   PAC placed 12/10/19   12/17/2019 - 06/01/2020 Chemotherapy   FOLFOX q2weeks starting in 2 weeks starting 12/17/19. Held 01/27/20-02/10/20 due to b/l PE. Oxaliplatin  held C11-12 due to neuropathy. Completed on 06/01/20   03/02/2021 Imaging   CT CAP  IMPRESSION: 1. No findings of active/recurrent malignancy. Partial right hemicolectomy. 2. Endometrial stripe remains mildly thickened, but endometrial biopsy in May was negative for malignancy. 3. Progressive endplate sclerosis and endplate irregularity at T2-3, probably due to degenerative endplate findings. If the has referable upper thoracic pain/symptoms then thoracic spine MRI could be used for further workup. 4. Other imaging findings of potential clinical significance: Mild cardiomegaly. Aortic Atherosclerosis (ICD10-I70.0). Mild mitral valve calcification. Postoperative findings in the left breast with adjacent  radiation port anteriorly in the left upper  lobe. Tiny pulmonary nodules in the left lower lobe are unchanged from earliest available comparison of 10/17/2019 and probably benign although may merit surveillance. Multilevel lumbar impingement. Mild pelvic floor laxity.   08/26/2021 Imaging   EXAM: CT CHEST, ABDOMEN, AND PELVIS WITH CONTRAST  IMPRESSION: 1. Stable examination without new or progressive findings to suggest local recurrence or metastatic disease within the chest, abdomen, or pelvis. 2. Hepatomegaly with hepatic steatosis. 3. Sigmoid colonic diverticulosis without findings of acute diverticulitis. 4. Similar prominent endplate sclerosis and irregularity at T2-T3 is most consistent with Modic type endplate degenerative changes. However, if patient has referable upper thoracic pain consider further workup with thoracic spine MRI. 5. Similar thickening of the endometrial stripe measuring up to 8 mm, which was previously biopsied with results negative for malignancy. 6. Aortic Atherosclerosis (ICD10-I70.0).   11/10/2021 PET scan   IMPRESSION: 1. LEFT ovary is increased in size and is intensely hypermetabolic. While physiologic hypermetabolic ovarian tissue is not uncommon, the enlargement and asymmetric activity warrants further evaluation. Consider contrast pelvic MRI vs tissue sampling. 2. No evidence of metastatic colorectal carcinoma otherwise. 3. Post RIGHT hemicolectomy anatomy. 4. Evidence of radiation change in the LEFT upper lobe (remote breast cancer).     12/22/2021 Relapse/Recurrence    FINAL MICROSCOPIC DIAGNOSIS:   A. LEFT OVARY AND FALLOPIAN TUBE, SALPINGO OOPHORECTOMY:  - Metastatic moderately differentiated colonic adenocarcinoma involving left ovary  - Focal ovarian stromal calcification  - Segment of benign fallopian tube   B. UTERUS WITH RIGHT FALLOPIAN TUBE AND OVARY, HYSTERECTOMY AND SALPINGO-OOPHORECTOMY:  - Metastatic moderately differentiated  colonic adenocarcinoma involving right ovary  - Focal invasive extensive adenomyosis  - Benign endometrial polyps  - Benign proliferative phase endometrium  - Hydrosalpinx of right fallopian tube  - Focal ovarian stromal calcification   C. PERITONEAL DEPOSITS, ANTERIOR CUL DE SAC, BIOPSY:  - Metastatic mucinous adenocarcinoma, consistent with colorectal primary    COMMENT:  Immunohistochemical stains show that the tumor cells are positive for CK20 and CDX2 while they are negative for CK7 and PAX8, consistent with above interpretation.    01/24/2022 - 03/23/2022 Chemotherapy   Patient is on Treatment Plan : COLORECTAL FOLFOX q14d x 3 months     04/01/2022 Imaging   CT AP IMPRESSION: 1. New omental metastatic disease. 2. Vague hypoattenuating lesion in the inferior right hepatic lobe is new and also worrisome for metastatic disease. 3. Similar small right lower lobe nodules. Recommend continued attention on follow-up. 4. Steatotic enlarged liver. 5.  Aortic atherosclerosis (ICD10-I70.0).   04/06/2022 - 09/18/2023 Chemotherapy   Patient is on Treatment Plan : COLORECTAL FOLFIRI + Bevacizumab  q14d     04/21/2022 Imaging    IMPRESSION: 1. Stable chest CT. No evidence of metastatic disease. 2. Stable small pulmonary nodules, considered benign based on stability. 3. Stable postsurgical changes in the left breast and anterior left upper lobe radiation changes. 4. Aortic Atherosclerosis (ICD10-I70.0) and Emphysema (ICD10-J43.9).   04/21/2022 Imaging    IMPRESSION: 1. Small enhancing lesion inferiorly in the right hepatic lobe is typical of metastatic disease and stable from recent abdominal CT. No other definite liver lesions are identified. Mild hepatic steatosis. 2. No other significant abdominal findings. 3. Omental nodularity seen on CT is not well visualized by MRI.    Imaging     06/23/2022 Imaging    IMPRESSION: 1. Hypodense lesion of the inferior right lobe of the  liver, hepatic segment VI is diminished in size, consistent with treatment response. 2. Tiny peritoneal and  omental nodules identified by prior examination are diminished in size, consistent with treatment response. 3. Multiple tiny pulmonary nodules unchanged, most likely benign and incidental, however continued attention on follow-up warranted in the setting of known metastatic disease. 4. No evidence of new metastatic disease in the chest, abdomen, or pelvis. 5. Status post partial right hemicolectomy and reanastomosis. 6. Diffuse mosaic attenuation of the airspaces, consistent with small airways disease. 7. Hepatomegaly.   10/02/2023 - 02/26/2024 Chemotherapy   Patient is on Treatment Plan : COLORECTAL Bevacizumab  q21d     12/18/2023 Imaging   CT CAP with contrast IMPRESSION: 1. Prior partial right hemicolectomy with ileocolic anastomosis. No suspicious nodularity along the suture line. Prominent adjacent lymph nodes are similar to prior. 2. Stable tiny scattered pulmonary nodules. 3. No evidence of new or progressive disease in the chest, abdomen or pelvis. 4. Hepatomegaly with hepatic steatosis. 5. Aortic atherosclerosis.     03/11/2024 -  Chemotherapy   Patient is on Treatment Plan : COLORECTAL 5FU + Leucovorin  (Modified [place]) + Bevacizumab  q14d        Discussed the use of AI scribe software for clinical note transcription with the patient, who gave verbal consent to proceed.  History of Present Illness Kim Castro is a 78 year old female with metastatic colon cancer to the liver receiving bevacizumab  and chemotherapy who presents for routine oncology follow-up.  She is on a q2-week bevacizumab -based chemotherapy pump regimen for metastatic colon cancer with hepatic involvement. CT on November 7 and a recent brain MRI showed no new disease. Tumor markers have risen gradually from 5 to 13 but remain only slightly above normal. She has no new cancer-related pain  or other malignancy symptoms, and recent blood counts are within normal limits.  She has persistent hypertension with elevated readings at home and in clinic while on losartan  25 mg daily. She denies headaches or chest discomfort.  She has stable chemotherapy-related hand skin toxicity managed with Vytorin gel and lotion. During a recent infusion she had a burning sensation around the port site that improved when the infusion rate was reduced.  She has chronic chemotherapy-induced peripheral neuropathy of the hands and feet, which is stable to slightly worse, possibly aggravated by cold and walking on hardwood floors. She uses warm slippers and does not feel she needs medication changes.     All other systems were reviewed with the patient and are negative.  MEDICAL HISTORY:  Past Medical History:  Diagnosis Date   Aortic atherosclerosis    Arthritis    feet, lower back   Basal cell carcinoma    arm   BRCA gene mutation negative    Breast cancer of upper-outer quadrant of left female breast (HCC) 06/04/2014   Cataract    immature on the left   Colon cancer (HCC) 08/2019   Diabetes mellitus without complication (HCC)    Diverticulosis    Dizziness    > 44yrs ago;took Antivert    Family history of anesthesia complication    sister slow to wake up with anesthesia   Family history of breast cancer    Family history of colon cancer    Family history of uterine cancer    GERD (gastroesophageal reflux disease)    takes occasional TUMs   History of bronchitis    > 73yrs ago   History of colon polyps    History of hiatal hernia    Small noted on CT   History of pulmonary embolus (PE)    Hypertension  takes Losartan  daily and HCTZ   Iron deficiency anemia    Joint pain    Metastatic disease (HCC) 2023   peritoneum and liver mets   Numbness    to toes on each foot   Peripheral neuropathy    feet and toes   Personal history of radiation therapy    Pulmonary nodules    Noted  on CT   Radiation 07/31/14-08/28/14   Left Breast 20 fxs   Seasonal allergies    takes Claritin prn   Urinary frequency    Vitamin D deficiency    takes VIt D daily    SURGICAL HISTORY: Past Surgical History:  Procedure Laterality Date   BREAST BIOPSY Bilateral    BREAST LUMPECTOMY Left    BREAST LUMPECTOMY WITH RADIOACTIVE SEED LOCALIZATION Left 07/01/2014   Procedure: LEFT BREAST LUMPECTOMY WITH RADIOACTIVE SEED LOCALIZATION;  Surgeon: Morene Olives, MD;  Location: [facility];  Service: General;  Laterality: Left;   CATARACT EXTRACTION Right    COLONOSCOPY  09/24/2019   [place]   LAPAROSCOPIC PARTIAL COLECTOMY N/A 11/01/2019   Procedure: LAPAROSCOPIC PARTIAL COLECTOMY;  Surgeon: [name], MD;  Location: [facility];  Service: General;  Laterality: N/A;   POLYPECTOMY     PORTACATH PLACEMENT N/A 12/10/2019   Procedure: INSERTION PORT-A-CATH ULTRASOUND GUIDED IN RIGHT IJ;  Surgeon: [name], MD;  Location: [facility];  Service: General;  Laterality: N/A;   ROBOTIC ASSISTED TOTAL HYSTERECTOMY Bilateral 12/22/2021   Procedure: XI ROBOTIC ASSISTED TOTAL HYSTERECTOMY WITH BILATERAL SALPINGO OOPHORECTOMY;  Surgeon: Viktoria Comer SAUNDERS, MD;  Location: [facility];  Service: Gynecology;  Laterality: Bilateral;   TOTAL KNEE ARTHROPLASTY Left 10/24/2012   Procedure: TOTAL KNEE ARTHROPLASTY;  Surgeon: Dempsey JINNY Sensor, MD;  Location: [facility];  Service: Orthopedics;  Laterality: Left;   TOTAL KNEE ARTHROPLASTY Right 01/09/2013   Procedure: TOTAL KNEE ARTHROPLASTY;  Surgeon: Dempsey JINNY Sensor, MD;  Location: [facility];  Service: Orthopedics;  Laterality: Right;   TUBAL LIGATION      I have reviewed the social history and family history with the patient and they are unchanged from previous note.  ALLERGIES:  is allergic to oxycodone .  MEDICATIONS:  Current Outpatient Medications  Medication Sig Dispense Refill   b complex vitamins capsule Take 1 capsule by mouth daily.      Cholecalciferol (VITAMIN D) 2000 UNITS CAPS Take 2,000 Units by mouth daily.      ELIQUIS  5 MG TABS tablet Take 1 tablet by mouth twice daily 60 tablet 0   famotidine (PEPCID) 20 MG tablet Take 20 mg by mouth daily.     ibuprofen (ADVIL) 200 MG tablet Take 200 mg by mouth daily as needed (pain).     lidocaine -prilocaine  (EMLA ) cream Apply to affected area once 30 g 3   loratadine (CLARITIN) 10 MG tablet Take 10 mg by mouth daily as needed for allergies.     losartan  (COZAAR ) 25 MG tablet Take 1 tablet by mouth once daily 90 tablet 2   magic mouthwash (nystatin , lidocaine , diphenhydrAMINE , alum & mag hydroxide) suspension Swish and swallow 4 times daily as needed for mouth pain. 140 mL 1   metFORMIN  (GLUCOPHAGE ) 500 MG tablet TAKE 1 TABLET BY MOUTH IN THE MORNING AND AT BEDTIME 180 tablet 3   Multiple Vitamins-Minerals (MULTIVITAMIN WITH MINERALS) tablet Take 1 tablet by mouth daily.     ondansetron  (ZOFRAN ) 8 MG tablet Take 1 tablet (8 mg total) by mouth every 8 (eight) hours as needed  for nausea or vomiting. 30 tablet 1   potassium chloride  (KLOR-CON ) 10 MEQ tablet Take 1 tablet (10 mEq total) by mouth daily. 60 tablet 0   pravastatin  (PRAVACHOL ) 10 MG tablet Take 1 tablet by mouth once daily 90 tablet 3   pregabalin  (LYRICA ) 25 MG capsule TAKE 1 CAPSULE BY MOUTH ONCE DAILY IN  ADDITION  TO  50MG   AT  BEDTIME 30 capsule 0   pregabalin  (LYRICA ) 50 MG capsule TAKE 1 CAPSULE BY MOUTH TWICE DAILY IN ADDITION TO THE 25 MG CAPSULE AS BEDTIME FOR A TOTAL OF 75 MG AT BEDTIME. 60 capsule 0   prochlorperazine  (COMPAZINE ) 10 MG tablet Take 1 tablet (10 mg total) by mouth every 6 (six) hours as needed for nausea or vomiting. 30 tablet 1   traMADol  (ULTRAM ) 50 MG tablet Take 1 tablet (50 mg total) by mouth every 6 (six) hours as needed. 30 tablet 0   triamcinolone  ointment (KENALOG ) 0.5 % Apply 1 Application topically 2 (two) times daily. 30 g 0   No current facility-administered medications for this  visit.    PHYSICAL EXAMINATION: ECOG PERFORMANCE STATUS: 1  Vitals:   04/22/24 0821 04/22/24 0822  BP: (!) 166/68 (!) 150/69  Pulse: 66   Resp: 17   Temp: 98.4 F (36.9 C)   SpO2: 98%    Wt Readings from Last 3 Encounters:  04/22/24 218 lb 4.8 oz (99 kg)  04/08/24 222 lb (100.7 kg)  03/25/24 220 lb 1.6 oz (99.8 kg)     GENERAL:alert, no distress and comfortable SKIN: skin color, texture, turgor are normal, no rashes or significant lesions EYES: normal, Conjunctiva are pink and non-injected, sclera clear NECK: supple, thyroid normal size, non-tender, without nodularity LYMPH:  no palpable lymphadenopathy in the cervical, axillary  LUNGS: clear to auscultation and percussion with normal breathing effort HEART: regular rate & rhythm and no murmurs and no lower extremity edema ABDOMEN:abdomen soft, non-tender and normal bowel sounds Musculoskeletal:no cyanosis of digits and no clubbing    Physical Exam    LABORATORY DATA:  I have reviewed the data as listed    Latest Ref Rng & Units 04/22/2024    7:55 AM 04/08/2024    9:00 AM 03/25/2024    7:40 AM  CBC  WBC 4.0 - 10.5 K/uL 6.6  6.5  5.0   Hemoglobin 12.0 - 15.0 g/dL 87.2  87.8  88.3   Hematocrit 36.0 - 46.0 % 37.9  36.4  34.7   Platelets 150 - 400 K/uL 181  171  131         Latest Ref Rng & Units 04/22/2024    7:55 AM 04/08/2024    9:00 AM 03/25/2024    7:40 AM  CMP  Glucose 70 - 99 mg/dL 883  888  895   BUN 8 - 23 mg/dL 24  22  16    Creatinine 0.44 - 1.00 mg/dL 9.04  9.25  9.29   Sodium 135 - 145 mmol/L 144  143  144   Potassium 3.5 - 5.1 mmol/L 4.7  4.4  4.4   Chloride 98 - 111 mmol/L 108  108  109   CO2 22 - 32 mmol/L 26  25  26    Calcium  8.9 - 10.3 mg/dL 9.6  9.7  9.5   Total Protein 6.5 - 8.1 g/dL 7.1  6.8  6.6   Total Bilirubin 0.0 - 1.2 mg/dL 1.1  1.3  1.4   Alkaline Phos 38 - 126 U/L 82  91  84   AST 15 - 41 U/L 22  37  31   ALT 0 - 44 U/L 12  26  18        RADIOGRAPHIC STUDIES: I have  personally reviewed the radiological images as listed and agreed with the findings in the report. No results found.    Orders Placed This Encounter  Procedures   CEA (Access)    Standing Status:   Standing    Number of Occurrences:   30    Expiration Date:   04/22/2025   All questions were answered. The patient knows to call the clinic with any problems, questions or concerns. No barriers to learning was detected. The total time spent in the appointment was 25 minutes, including review of chart and various tests results, discussions about plan of care and coordination of care plan     Onita Mattock, MD 04/22/2024     "

## 2024-04-22 NOTE — Patient Instructions (Signed)
 [facility] - A DEPT OF [place] [place] [facility]  Discharge Instructions: Thank you for choosing [facility] to provide your oncology and hematology care.   If you have a lab appointment with the Cancer Center, please go directly to the Cancer Center and check in at the registration area.   Wear comfortable clothing and clothing appropriate for easy access to any Portacath or PICC line.   We strive to give you quality time with your provider. You may need to reschedule your appointment if you arrive late (15 or more minutes).  Arriving late affects you and other patients whose appointments are after yours.  Also, if you miss three or more appointments without notifying the office, you may be dismissed from the clinic at the provider's discretion.      For prescription refill requests, have your pharmacy contact our office and allow 72 hours for refills to be completed.    Today you received the following chemotherapy and/or immunotherapy agents: Bevacizumab , Leucovorin , Fluorouracil      To help prevent nausea and vomiting after your treatment, we encourage you to take your nausea medication as directed.  BELOW ARE SYMPTOMS THAT SHOULD BE REPORTED IMMEDIATELY: *FEVER GREATER THAN 100.4 F (38 C) OR HIGHER *CHILLS OR SWEATING *NAUSEA AND VOMITING THAT IS NOT CONTROLLED WITH YOUR NAUSEA MEDICATION *UNUSUAL SHORTNESS OF BREATH *UNUSUAL BRUISING OR BLEEDING *URINARY PROBLEMS (pain or burning when urinating, or frequent urination) *BOWEL PROBLEMS (unusual diarrhea, constipation, pain near the anus) TENDERNESS IN MOUTH AND THROAT WITH OR WITHOUT PRESENCE OF ULCERS (sore throat, sores in mouth, or a toothache) UNUSUAL RASH, SWELLING OR PAIN  UNUSUAL VAGINAL DISCHARGE OR ITCHING   Items with * indicate a potential emergency and should be followed up as soon as possible or go to the Emergency Department if any problems should occur.  Please show the CHEMOTHERAPY  ALERT CARD or IMMUNOTHERAPY ALERT CARD at check-in to the Emergency Department and triage nurse.  Should you have questions after your visit or need to cancel or reschedule your appointment, please contact [facility]  Dept: [phone]  and follow the prompts.  Office hours are 8:00 a.m. to 4:30 p.m. Monday - Friday. Please note that voicemails left after 4:00 p.m. may not be returned until the following business day.  We are closed weekends and major holidays. You have access to a nurse at all times for urgent questions. Please call the main number to the clinic Dept: [phone] and follow the prompts.   For any non-urgent questions, you may also contact your provider using MyChart. We now offer e-Visits for anyone 40 and older to request care online for non-urgent symptoms. For details visit [email or web address].   Also download the MyChart app! Go to the app store, search MyChart, open the app, select , and log in with your MyChart username and password.

## 2024-04-24 ENCOUNTER — Inpatient Hospital Stay

## 2024-05-05 NOTE — Assessment & Plan Note (Addendum)
 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 Path showed overall Stage IIIB cancer.  -s/p 6 months adjuvant FOLFOX, completed 06/01/20, oxaliplatin  held for final 2 cycles due to neuropathy.  -s/p BSO and hysterectomy on 12/22/21 with Dr. Viktoria. Path revealed metastatic moderately differentiated colonic adenocarcinoma involving both ovaries and peritoneum -she restarted FOLFOX on 01/24/22. -Due to disease progression, chemotherapy changed to second line FOLFIRI and bevacizumab  on April 06, 2022. She has been tolerating moderately well, better now with dose reduction, will continue -Restaging CT scan from 09/27/2022 showed continous response in liver and peritoneal metastasis, no other new lesions.   -We discussed maintenance therapy option in future  -due to her PE in 10/2022, will hold beva for 3 months  -restaging CT 12/31/2022 showed stable disease and live met is not well defined on CT. She restarted beva on 01/17/23  -restaging CT 06/19/2023 showed overall stable disease (stable small lung nodules, no other evidence of cancer) Restaging CT 09/12/2023 showed no residual radiographic evidence of disease.   - We discussed option of maintenance therapy, treatment changed to capecitabine  and bevacizumab  on 10/02/2023.  Due to significant fatigue, capecitabine  was changed to 5-FU pump infusion in November 2025. -CT 12/18/23 and 03/08/2024 showed stable disease - 05/06/2024 -presents for cycle 5 day 1 maintenance 5-FU pump/leucovorin  and bevacizumab .  Tolerating well overall.

## 2024-05-05 NOTE — Progress Notes (Signed)
 " Patient Care Team: [name], MD as PCP - General (Family Medicine) [name], MD as Consulting Physician (General Surgery) [name], MD as Consulting Physician (Hematology) [name], MD as Consulting Physician (General Surgery) [name], MD as Consulting Physician (Hematology and Oncology) [facility], [facility]   Clinic Day:  05/06/2024  Referring physician: [name], MD  ASSESSMENT & PLAN:   Assessment & Plan: Malignant neoplasm of ascending colon [facility]) pT3N2aM0 stage IIB, MSS, metastatic to b/l ovaries and peritoneum and liver, KRAS mutation G12V (+)  -Initially diagnosed in 08/2019, s/p resection with Dr Debby on 11/01/19. Path showed overall Stage IIIB cancer.  -s/p 6 months adjuvant FOLFOX, completed 06/01/20, oxaliplatin  held for final 2 cycles due to neuropathy.  -s/p BSO and hysterectomy on 12/22/21 with Dr. Viktoria. Path revealed metastatic moderately differentiated colonic adenocarcinoma involving both ovaries and peritoneum -she restarted FOLFOX on 01/24/22. -Due to disease progression, chemotherapy changed to second line FOLFIRI and bevacizumab  on April 06, 2022. She has been tolerating moderately well, better now with dose reduction, will continue -Restaging CT scan from 09/27/2022 showed continous response in liver and peritoneal metastasis, no other new lesions.   -We discussed maintenance therapy option in future  -due to her PE in 10/2022, will hold beva for 3 months  -restaging CT 12/31/2022 showed stable disease and live met is not well defined on CT. She restarted beva on 01/17/23  -restaging CT 06/19/2023 showed overall stable disease (stable small lung nodules, no other evidence of cancer) Restaging CT 09/12/2023 showed no residual radiographic evidence of disease.   - We discussed option of maintenance therapy, treatment changed to capecitabine  and bevacizumab  on 10/02/2023.  Due to significant fatigue, capecitabine  was  changed to 5-FU pump infusion in November 2025. -CT 12/18/23 and 03/08/2024 showed stable disease - 05/06/2024 -presents for cycle 5 day 1 maintenance 5-FU pump/leucovorin  and bevacizumab .  Tolerating well overall.   Low back pain She reports increased aches and pains, mostly in her low back.  She does see orthopedics for this.  She does have prescriptions for Ultram  and Lyrica  to help manage pain.  Diarrhea She does get some diarrhea, worse after treatment.  This is controllable with antidiarrheal medications.  States this has been more manageable and has not needed Imodium for some time.  Anemia Mild anemia with Hgb 12.2 HCT 36.5.  Will continue to monitor closely and treat as indicated.  Colon cancer Presents for cycle 5 day 1 chemotherapy with 5-FU/leucovorin .  Will also get bevacizumab  today.  CEA improved from 13.13 to 8.81 today. Will continue to takemonitor.   Plan  Labs reviewed. - Mild and stable anemia. - Unremarkable CMP. - Improved CEA. Labs and patient presentation are appropriate for treatment today.  If Proceed with cycle 5 day one overall tolerating well. Labs prescribed, follow-up, and cycle 6 as scheduled.  The patient understands the plans discussed today and is in agreement with them.  She knows to contact our office if she develops concerns prior to her next appointment.  I provided 25 minutes of face-to-face time during this encounter and > 50% was spent counseling as documented under my assessment and plan.    Powell FORBES Lessen, NP  [facility] - A DEPT OF [facility] [address] [place] [address] Dept: [phone] Dept Fax: [phone]   No orders of the defined types were placed in this encounter.  CHIEF COMPLAINT:  CC: Malignant neoplasm of ascending colon.  Current Treatment: Maintenance 5-FU pump and bevacizumab   INTERVAL HISTORY:  Kim Castro is here today for repeat  clinical assessment.  She was last seen by Dr. Lanny on 04/22/2024.  Today, she presents for cycle 5 day 1 maintenance 5-FU/leucovorin  and bevacizumab  today.  Overall, tolerating  well.  Does get some diarrhea.  This has improved in frequency and severity.  Has not required use of Imodium in some time.  She denies chest pain, chest pressure, or shortness of breath. She denies headaches or visual disturbances. She denies abdominal pain, nausea, vomiting, or changes in bowel or bladder habits. She denies fevers or chills. She does have increased pain in her low back.  She sees orthopedics for this.. Her appetite is good. Her weight has increased 3 pounds over last 2 weeks.  I have reviewed the past medical history, past surgical history, social history and family history with the patient and they are unchanged from previous note.  ALLERGIES:  is allergic to oxycodone .  MEDICATIONS:  Current Outpatient Medications  Medication Sig Dispense Refill   apixaban  (ELIQUIS ) 5 MG TABS tablet Take 1 tablet (5 mg total) by mouth 2 (two) times daily. 60 tablet 2   b complex vitamins capsule Take 1 capsule by mouth daily.     Cholecalciferol (VITAMIN D) 2000 UNITS CAPS Take 2,000 Units by mouth daily.      famotidine (PEPCID) 20 MG tablet Take 20 mg by mouth daily.     ibuprofen (ADVIL) 200 MG tablet Take 200 mg by mouth daily as needed (pain).     lidocaine -prilocaine  (EMLA ) cream Apply to affected area once 30 g 3   loratadine (CLARITIN) 10 MG tablet Take 10 mg by mouth daily as needed for allergies.     losartan  (COZAAR ) 25 MG tablet Take 1 tablet by mouth once daily 90 tablet 2   magic mouthwash (nystatin , lidocaine , diphenhydrAMINE , alum & mag hydroxide) suspension Swish and swallow 4 times daily as needed for mouth pain. 140 mL 1   metFORMIN  (GLUCOPHAGE ) 500 MG tablet TAKE 1 TABLET BY MOUTH IN THE MORNING AND AT BEDTIME 180 tablet 3   Multiple Vitamins-Minerals (MULTIVITAMIN WITH MINERALS) tablet Take 1  tablet by mouth daily.     ondansetron  (ZOFRAN ) 8 MG tablet Take 1 tablet (8 mg total) by mouth every 8 (eight) hours as needed for nausea or vomiting. 30 tablet 1   potassium chloride  (KLOR-CON ) 10 MEQ tablet Take 1 tablet (10 mEq total) by mouth daily. 60 tablet 2   pravastatin  (PRAVACHOL ) 10 MG tablet Take 1 tablet by mouth once daily 90 tablet 3   pregabalin  (LYRICA ) 25 MG capsule Take 1 capsule (25 mg total) by mouth at bedtime. 30 capsule 2   pregabalin  (LYRICA ) 50 MG capsule Take 1 capsule (50 mg total) by mouth 2 (two) times daily. 60 capsule 2   prochlorperazine  (COMPAZINE ) 10 MG tablet Take 1 tablet (10 mg total) by mouth every 6 (six) hours as needed for nausea or vomiting. 30 tablet 1   traMADol  (ULTRAM ) 50 MG tablet Take 1 tablet (50 mg total) by mouth every 6 (six) hours as needed. 30 tablet 0   triamcinolone  ointment (KENALOG ) 0.5 % Apply 1 Application topically 2 (two) times daily. 30 g 0   No current facility-administered medications for this visit.   Facility-Administered Medications Ordered in Other Visits  Medication Dose Route Frequency Provider Last Rate Last Admin   0.9 %  sodium chloride  infusion   Intravenous Continuous [name], MD   Stopped at 05/20/24 1057   fluorouracil  (ADRUCIL ) 5,000 mg in sodium chloride  0.9 % 150 mL chemo infusion  2,400 mg/m2 (Treatment Plan Recorded) Intravenous 1 day or 1 dose [name], MD   Infusion Verify at 05/20/24 1105    HISTORY OF PRESENT ILLNESS:   Oncology History Overview Note   Cancer Staging  Malignant neoplasm of female breast [facility]) Staging form: Breast, AJCC 7th Edition - Clinical stage from 06/11/2014: Stage 0 (Tis (DCIS), N0, M0) - Unsigned Staged by: Pathologist and managing physician Laterality: Left Estrogen receptor status: Positive Progesterone receptor status: Positive Stage used in treatment planning: Yes National guidelines used in treatment planning: Yes Type of national guideline used in treatment planning:  NCCN - Pathologic stage from 07/03/2014: Stage Unknown (Tis (DCIS), NX, cM0) - Signed by Guinevere Chew, MD on 07/10/2014 Staged by: Pathologist Laterality: Left Estrogen receptor status: Positive Progesterone receptor status: Positive Stage used in treatment planning: Yes National guidelines used in treatment planning: Yes Type of national guideline used in treatment planning: NCCN Staging comments: Staged on final lumpectomy specimen by Dr. Loral.  Right colon cancer Staging form: Colon and Rectum, AJCC 8th Edition - Pathologic stage from 11/01/2019: Stage IIIB (pT3, pN2a, cM0) - Signed by [name], MD on 11/29/2019 Stage prefix: Initial diagnosis Histologic grading system: 4 grade system Histologic grade (G): G2 Residual tumor (R): R0 - None Tumor deposits (TD): Absent Perineural invasion (PNI): Absent Microsatellite instability (MSI): Stable KRAS mutation: Unknown NRAS mutation: Unknown BRAF mutation: Unknown     Malignant neoplasm of female breast (HCC) (Resolved)  05/29/2014 Initial Biopsy   Left breast needle core biopsy: Grade 2, DCIS with calcs. ER+ (100%), PR+ (96%).    06/04/2014 Initial Diagnosis   Left breast DCIS with calcifications, ER 100%, PR 96%   06/10/2014 Breast MRI   Left breast: 2.4 x 1.3 x 1.1 cm area of patchy non-mass enhancement upper outer quadrant includes postbiopsy seroma; Right breast: 1.2 cm previously biopsied stable benign fibroadenoma   06/12/2014 Procedure   Genetic counseling/testing: Identified 1 VUS on CHEK2 gene. Remainder of 17 gene panel tested negative and included: ATM, BARD1, BRCA 1/2, BRIP1, CDH1, CHEK2, EPCAM, MLH1, MSH2, MSH6, NBN, NF1, PALB2, PTEN, RAD50, RAD51C, RAD51D, STK11, and TP53.    07/01/2014 Surgery   Left breast lumpectomy ([facility]): Grade 1, DCIS, spanning 2.3 cm, 1 mm margin, ER 100%, PR 96%   07/31/2014 - 08/28/2014 Radiation Therapy   Adjuvant RT completed Signe). Left breast: Total dose 42.5 Gy over 17 fractions. Left  breast boost: Total dose 7.5 Gy over 3 fractions.    09/14/2014 - 01/13/2020 Anti-estrogen oral therapy   Anastrazole 1mg  daily. Planned duration of treatment: 5 years ([place]). Completed in 01/2020.    09/25/2014 Survivorship   Survivorship Care Plan given to patient and reviewed with her in person.    03/02/2021 Imaging   CT CAP  IMPRESSION: 1. No findings of active/recurrent malignancy. Partial right hemicolectomy. 2. Endometrial stripe remains mildly thickened, but endometrial biopsy in May was negative for malignancy. 3. Progressive endplate sclerosis and endplate irregularity at T2-3, probably due to degenerative endplate findings. If the has referable upper thoracic pain/symptoms then thoracic spine MRI could be used for further workup. 4. Other imaging findings of potential clinical significance: Mild cardiomegaly. Aortic Atherosclerosis (ICD10-I70.0). Mild mitral valve calcification. Postoperative findings in the left breast with adjacent radiation port anteriorly in the left upper lobe. Tiny pulmonary nodules  in the left lower lobe are unchanged from earliest available comparison of 10/17/2019 and probably benign although may merit surveillance. Multilevel lumbar impingement. Mild pelvic floor laxity.   Malignant neoplasm of ascending colon (HCC)  09/19/2019 Imaging   CT AP W contrast 09/19/19  IMPRESSION Fullness in the cecum, cannot exclude a mass. No evidence for metastatic disease is identified.    09/24/2019 Procedure   Colonoscopy by Dr Ernesto 09/24/19 IMPRESSION 1. The colon was redundant  2. Mild diverticulosis was noted through the entire examined colon 3. Single 12mm polyp was found in the ascending colon; polypectomy was performed using snare cautery and biopsy forceps 4. Mild diverticulosis was notes in the descending colon and sigmoid colon.  5. Single polyp was found in the sigmoid colon, polypectomy was performed with cold forceps.  6. Single polyp was found in  the rectosigmoid colon; polypectomy was performed with cold snare  7. Small internal hemorrhoids  8. Large mass was found at the cecum; multiple biopsies of the area were performed using cold forceps; injection (tattooing) was performed distal to the mass.    09/24/2019 Initial Biopsy   INTERPRETATION AND DIAGNOSIS:  A. Cecum, biopsy:  Invasive moderately differentiated adenocarcinoma.  see comment  B. Polyp @ ascending colon, polypectomy:  Tubular Adenoma  C. Polyp @ sigmoid colon Polypectomy:  hyperplastic polyp.  D. Polyp @ rectosigmoid colon, Polypectomy:  Hyperplastic Polyp      10/16/2019 Imaging   CT Chest IMPRESSION: 1. Multiple small pulmonary nodules measuring 5 mm or less in size in the lungs. These are nonspecific and are typically considered statistically likely benign. However, given the patient's history of primary malignancy, close attention on follow-up studies is recommended to ensure stability. 2. Aortic atherosclerosis, in addition to right coronary artery disease. Assessment for potential risk factor modification, dietary therapy or pharmacologic therapy may be warranted, if clinically indicated. 3. There are calcifications of the aortic valve and mitral annulus. Echocardiographic correlation for evaluation of potential valvular dysfunction may be warranted if clinically indicated. 4. Small hiatal hernia.   Aortic Atherosclerosis (ICD10-I70.0).   11/01/2019 Initial Diagnosis   Colon cancer (HCC)   11/01/2019 Surgery   LAPAROSCOPIC PARTIAL COLECTOMY by Dr Debby and Dr Sheldon   11/01/2019 Pathology Results   FINAL MICROSCOPIC DIAGNOSIS:   A. COLON, PROXIMAL RIGHT, COLECTOMY:  - Invasive colonic adenocarcinoma, 5 cm.  - Tumor invades through the muscularis propria into pericolonic tissues.   - Margins of resection are not involved.  - Metastatic carcinoma in (5) of (13) lymph nodes.  - See oncology table.    MSI Stable  Mismatch repair normal  MLH1 -  Preserved nuclear expression (greater 50% tumor expression) MSH2 - Preserved nuclear expression (greater 50% tumor expression) MSH6 - Preserved nuclear expression (greater 50% tumor expression) PMS2 - Preserved nuclear expression (greater 50% tumor expression)   11/01/2019 Cancer Staging   Staging form: Colon and Rectum, AJCC 8th Edition - Pathologic stage from 11/01/2019: Stage IIIB (pT3, pN2a, cM0) - Signed by [name], MD on 11/29/2019   12/10/2019 Procedure   PAC placed 12/10/19   12/17/2019 - 06/01/2020 Chemotherapy   FOLFOX q2weeks starting in 2 weeks starting 12/17/19. Held 01/27/20-02/10/20 due to b/l PE. Oxaliplatin  held C11-12 due to neuropathy. Completed on 06/01/20   03/02/2021 Imaging   CT CAP  IMPRESSION: 1. No findings of active/recurrent malignancy. Partial right hemicolectomy. 2. Endometrial stripe remains mildly thickened, but endometrial biopsy in May was negative for malignancy. 3. Progressive endplate sclerosis and endplate  irregularity at T2-3, probably due to degenerative endplate findings. If the has referable upper thoracic pain/symptoms then thoracic spine MRI could be used for further workup. 4. Other imaging findings of potential clinical significance: Mild cardiomegaly. Aortic Atherosclerosis (ICD10-I70.0). Mild mitral valve calcification. Postoperative findings in the left breast with adjacent radiation port anteriorly in the left upper lobe. Tiny pulmonary nodules in the left lower lobe are unchanged from earliest available comparison of 10/17/2019 and probably benign although may merit surveillance. Multilevel lumbar impingement. Mild pelvic floor laxity.   08/26/2021 Imaging   EXAM: CT CHEST, ABDOMEN, AND PELVIS WITH CONTRAST  IMPRESSION: 1. Stable examination without new or progressive findings to suggest local recurrence or metastatic disease within the chest, abdomen, or pelvis. 2. Hepatomegaly with hepatic steatosis. 3. Sigmoid colonic diverticulosis  without findings of acute diverticulitis. 4. Similar prominent endplate sclerosis and irregularity at T2-T3 is most consistent with Modic type endplate degenerative changes. However, if patient has referable upper thoracic pain consider further workup with thoracic spine MRI. 5. Similar thickening of the endometrial stripe measuring up to 8 mm, which was previously biopsied with results negative for malignancy. 6. Aortic Atherosclerosis (ICD10-I70.0).   11/10/2021 PET scan   IMPRESSION: 1. LEFT ovary is increased in size and is intensely hypermetabolic. While physiologic hypermetabolic ovarian tissue is not uncommon, the enlargement and asymmetric activity warrants further evaluation. Consider contrast pelvic MRI vs tissue sampling. 2. No evidence of metastatic colorectal carcinoma otherwise. 3. Post RIGHT hemicolectomy anatomy. 4. Evidence of radiation change in the LEFT upper lobe (remote breast cancer).     12/22/2021 Relapse/Recurrence    FINAL MICROSCOPIC DIAGNOSIS:   A. LEFT OVARY AND FALLOPIAN TUBE, SALPINGO OOPHORECTOMY:  - Metastatic moderately differentiated colonic adenocarcinoma involving left ovary  - Focal ovarian stromal calcification  - Segment of benign fallopian tube   B. UTERUS WITH RIGHT FALLOPIAN TUBE AND OVARY, HYSTERECTOMY AND SALPINGO-OOPHORECTOMY:  - Metastatic moderately differentiated colonic adenocarcinoma involving right ovary  - Focal invasive extensive adenomyosis  - Benign endometrial polyps  - Benign proliferative phase endometrium  - Hydrosalpinx of right fallopian tube  - Focal ovarian stromal calcification   C. PERITONEAL DEPOSITS, ANTERIOR CUL DE SAC, BIOPSY:  - Metastatic mucinous adenocarcinoma, consistent with colorectal primary    COMMENT:  Immunohistochemical stains show that the tumor cells are positive for CK20 and CDX2 while they are negative for CK7 and PAX8, consistent with above interpretation.    01/24/2022 - 03/23/2022  Chemotherapy   Patient is on Treatment Plan : COLORECTAL FOLFOX q14d x 3 months     04/01/2022 Imaging   CT AP IMPRESSION: 1. New omental metastatic disease. 2. Vague hypoattenuating lesion in the inferior right hepatic lobe is new and also worrisome for metastatic disease. 3. Similar small right lower lobe nodules. Recommend continued attention on follow-up. 4. Steatotic enlarged liver. 5.  Aortic atherosclerosis (ICD10-I70.0).   04/06/2022 - 09/18/2023 Chemotherapy   Patient is on Treatment Plan : COLORECTAL FOLFIRI + Bevacizumab  q14d     04/21/2022 Imaging    IMPRESSION: 1. Stable chest CT. No evidence of metastatic disease. 2. Stable small pulmonary nodules, considered benign based on stability. 3. Stable postsurgical changes in the left breast and anterior left upper lobe radiation changes. 4. Aortic Atherosclerosis (ICD10-I70.0) and Emphysema (ICD10-J43.9).   04/21/2022 Imaging    IMPRESSION: 1. Small enhancing lesion inferiorly in the right hepatic lobe is typical of metastatic disease and stable from recent abdominal CT. No other definite liver lesions are identified. Mild hepatic steatosis.  2. No other significant abdominal findings. 3. Omental nodularity seen on CT is not well visualized by MRI.    Imaging     06/23/2022 Imaging    IMPRESSION: 1. Hypodense lesion of the inferior right lobe of the liver, hepatic segment VI is diminished in size, consistent with treatment response. 2. Tiny peritoneal and omental nodules identified by prior examination are diminished in size, consistent with treatment response. 3. Multiple tiny pulmonary nodules unchanged, most likely benign and incidental, however continued attention on follow-up warranted in the setting of known metastatic disease. 4. No evidence of new metastatic disease in the chest, abdomen, or pelvis. 5. Status post partial right hemicolectomy and reanastomosis. 6. Diffuse mosaic attenuation of the  airspaces, consistent with small airways disease. 7. Hepatomegaly.   10/02/2023 - 02/26/2024 Chemotherapy   Patient is on Treatment Plan : COLORECTAL Bevacizumab  q21d     12/18/2023 Imaging   CT CAP with contrast IMPRESSION: 1. Prior partial right hemicolectomy with ileocolic anastomosis. No suspicious nodularity along the suture line. Prominent adjacent lymph nodes are similar to prior. 2. Stable tiny scattered pulmonary nodules. 3. No evidence of new or progressive disease in the chest, abdomen or pelvis. 4. Hepatomegaly with hepatic steatosis. 5. Aortic atherosclerosis.     03/11/2024 -  Chemotherapy   Patient is on Treatment Plan : COLORECTAL 5FU + Leucovorin  (Modified [place]) + Bevacizumab  q14d         REVIEW OF SYSTEMS:   Constitutional: Denies fevers, chills or abnormal weight loss Eyes: Denies blurriness of vision Ears, nose, mouth, throat, and face: Denies mucositis or sore throat Respiratory: Denies cough, dyspnea or wheezes Cardiovascular: Denies palpitation, chest discomfort or lower extremity swelling Gastrointestinal:  Denies nausea, heartburn or change in bowel habits Skin: Denies abnormal skin rashes Lymphatics: Denies new lymphadenopathy or easy bruising Neurological:Denies numbness, tingling or new weaknesses Behavioral/Psych: Mood is stable, no new changes  All other systems were reviewed with the patient and are negative.   VITALS:   Today's Vitals   05/06/24 1028  BP: 138/70  Pulse: 62  Resp: 17  Temp: 98.1 F (36.7 C)  SpO2: 99%  Weight: 221 lb 3.2 oz (100.3 kg)   Body mass index is 36.25 kg/m.   Wt Readings from Last 3 Encounters:  05/20/24 224 lb 8 oz (101.8 kg)  05/06/24 221 lb 3.2 oz (100.3 kg)  04/22/24 218 lb 4.8 oz (99 kg)    Body mass index is 36.25 kg/m.  Performance status (ECOG): 1 - Symptomatic but completely ambulatory  PHYSICAL EXAM:   GENERAL:alert, no distress and comfortable SKIN: skin color, texture, turgor are  normal, no rashes or significant lesions EYES: normal, Conjunctiva are pink and non-injected, sclera clear OROPHARYNX:no exudate, no erythema and lips, buccal mucosa, and tongue normal  NECK: supple, thyroid normal size, non-tender, without nodularity LYMPH:  no palpable lymphadenopathy in the cervical, axillary or inguinal LUNGS: clear to auscultation and percussion with normal breathing effort HEART: regular rate & rhythm and no murmurs and no lower extremity edema ABDOMEN:abdomen soft, non-tender and normal bowel sounds Musculoskeletal:no cyanosis of digits and no clubbing  NEURO: alert & oriented x 3 with fluent speech, no focal motor/sensory deficits  LABORATORY DATA:  I have reviewed the data as listed    Component Value Date/Time   NA 143 05/20/2024 0803   NA 144 10/03/2014 0914   K 4.4 05/20/2024 0803   K 4.6 10/03/2014 0914   CL 110 05/20/2024 0803   CO2 24 05/20/2024 0803  CO2 29 10/03/2014 0914   GLUCOSE 114 (H) 05/20/2024 0803   GLUCOSE 133 10/03/2014 0914   BUN 23 05/20/2024 0803   BUN 17.8 10/03/2014 0914   CREATININE 1.01 (H) 05/20/2024 0803   CREATININE 0.8 10/03/2014 0914   CALCIUM  9.6 05/20/2024 0803   CALCIUM  9.7 10/03/2014 0914   PROT 6.8 05/20/2024 0803   PROT 7.0 10/03/2014 0914   ALBUMIN  4.1 05/20/2024 0803   ALBUMIN  3.7 10/03/2014 0914   AST 24 05/20/2024 0803   AST 18 10/03/2014 0914   ALT 18 05/20/2024 0803   ALT 13 10/03/2014 0914   ALKPHOS 85 05/20/2024 0803   ALKPHOS 74 10/03/2014 0914   BILITOT 1.1 05/20/2024 0803   BILITOT 0.98 10/03/2014 0914   GFRNONAA 57 (L) 05/20/2024 0803   GFRAA >60 01/30/2020 0429   GFRAA >60 01/27/2020 0834   Lab Results  Component Value Date   WBC 6.0 05/20/2024   NEUTROABS 3.3 05/20/2024   HGB 12.1 05/20/2024   HCT 36.7 05/20/2024   MCV 105.2 (H) 05/20/2024   PLT 165 05/20/2024    "

## 2024-05-06 ENCOUNTER — Inpatient Hospital Stay: Admitting: Nurse Practitioner

## 2024-05-06 ENCOUNTER — Inpatient Hospital Stay

## 2024-05-06 ENCOUNTER — Inpatient Hospital Stay: Attending: Hematology

## 2024-05-06 VITALS — BP 138/70 | HR 62 | Temp 98.1°F | Resp 17 | Wt 221.2 lb

## 2024-05-06 DIAGNOSIS — R197 Diarrhea, unspecified: Secondary | ICD-10-CM | POA: Diagnosis not present

## 2024-05-06 DIAGNOSIS — M545 Low back pain, unspecified: Secondary | ICD-10-CM | POA: Insufficient documentation

## 2024-05-06 DIAGNOSIS — C787 Secondary malignant neoplasm of liver and intrahepatic bile duct: Secondary | ICD-10-CM | POA: Diagnosis not present

## 2024-05-06 DIAGNOSIS — Z86711 Personal history of pulmonary embolism: Secondary | ICD-10-CM | POA: Insufficient documentation

## 2024-05-06 DIAGNOSIS — C182 Malignant neoplasm of ascending colon: Secondary | ICD-10-CM | POA: Insufficient documentation

## 2024-05-06 DIAGNOSIS — Z7901 Long term (current) use of anticoagulants: Secondary | ICD-10-CM | POA: Diagnosis not present

## 2024-05-06 DIAGNOSIS — E876 Hypokalemia: Secondary | ICD-10-CM | POA: Insufficient documentation

## 2024-05-06 DIAGNOSIS — G620 Drug-induced polyneuropathy: Secondary | ICD-10-CM | POA: Diagnosis not present

## 2024-05-06 DIAGNOSIS — C7963 Secondary malignant neoplasm of bilateral ovaries: Secondary | ICD-10-CM | POA: Insufficient documentation

## 2024-05-06 DIAGNOSIS — Z5111 Encounter for antineoplastic chemotherapy: Secondary | ICD-10-CM | POA: Diagnosis present

## 2024-05-06 DIAGNOSIS — D649 Anemia, unspecified: Secondary | ICD-10-CM | POA: Diagnosis not present

## 2024-05-06 DIAGNOSIS — C786 Secondary malignant neoplasm of retroperitoneum and peritoneum: Secondary | ICD-10-CM | POA: Diagnosis not present

## 2024-05-06 LAB — CMP (CANCER CENTER ONLY)
ALT: 16 U/L (ref 0–44)
AST: 28 U/L (ref 15–41)
Albumin: 4.2 g/dL (ref 3.5–5.0)
Alkaline Phosphatase: 83 U/L (ref 38–126)
Anion gap: 10 (ref 5–15)
BUN: 24 mg/dL — ABNORMAL HIGH (ref 8–23)
CO2: 24 mmol/L (ref 22–32)
Calcium: 9.4 mg/dL (ref 8.9–10.3)
Chloride: 109 mmol/L (ref 98–111)
Creatinine: 0.92 mg/dL (ref 0.44–1.00)
GFR, Estimated: >60 mL/min
Glucose, Bld: 107 mg/dL — ABNORMAL HIGH (ref 70–99)
Potassium: 4.5 mmol/L (ref 3.5–5.1)
Sodium: 143 mmol/L (ref 135–145)
Total Bilirubin: 1.2 mg/dL (ref 0.0–1.2)
Total Protein: 6.8 g/dL (ref 6.5–8.1)

## 2024-05-06 LAB — CBC WITH DIFFERENTIAL (CANCER CENTER ONLY)
Abs Immature Granulocytes: 0.01 K/uL (ref 0.00–0.07)
Basophils Absolute: 0 K/uL (ref 0.0–0.1)
Basophils Relative: 0 %
Eosinophils Absolute: 0.2 K/uL (ref 0.0–0.5)
Eosinophils Relative: 2 %
HCT: 36.5 % (ref 36.0–46.0)
Hemoglobin: 12.2 g/dL (ref 12.0–15.0)
Immature Granulocytes: 0 %
Lymphocytes Relative: 32 %
Lymphs Abs: 2.5 K/uL (ref 0.7–4.0)
MCH: 35.5 pg — ABNORMAL HIGH (ref 26.0–34.0)
MCHC: 33.4 g/dL (ref 30.0–36.0)
MCV: 106.1 fL — ABNORMAL HIGH (ref 80.0–100.0)
Monocytes Absolute: 0.7 K/uL (ref 0.1–1.0)
Monocytes Relative: 9 %
Neutro Abs: 4.5 K/uL (ref 1.7–7.7)
Neutrophils Relative %: 57 %
Platelet Count: 172 K/uL (ref 150–400)
RBC: 3.44 MIL/uL — ABNORMAL LOW (ref 3.87–5.11)
RDW: 14.6 % (ref 11.5–15.5)
WBC Count: 7.8 K/uL (ref 4.0–10.5)
nRBC: 0 % (ref 0.0–0.2)

## 2024-05-06 LAB — CEA (ACCESS): CEA (CHCC): 8.81 ng/mL — ABNORMAL HIGH (ref 0.00–5.00)

## 2024-05-06 MED ORDER — PROCHLORPERAZINE MALEATE 10 MG PO TABS
10.0000 mg | ORAL_TABLET | Freq: Once | ORAL | Status: AC
  Administered 2024-05-06: 10 mg via ORAL
  Filled 2024-05-06: qty 1

## 2024-05-06 MED ORDER — SODIUM CHLORIDE 0.9 % IV SOLN
400.0000 mg/m2 | Freq: Once | INTRAVENOUS | Status: AC
  Administered 2024-05-06: 856 mg via INTRAVENOUS
  Filled 2024-05-06: qty 42.8

## 2024-05-06 MED ORDER — SODIUM CHLORIDE 0.9 % IV SOLN: INTRAVENOUS | Status: DC

## 2024-05-06 MED ORDER — SODIUM CHLORIDE 0.9 % IV SOLN
2400.0000 mg/m2 | INTRAVENOUS | Status: DC
  Administered 2024-05-06: 5000 mg via INTRAVENOUS
  Filled 2024-05-06: qty 100

## 2024-05-06 MED ORDER — SODIUM CHLORIDE 0.9 % IV SOLN
5.0000 mg/kg | Freq: Once | INTRAVENOUS | Status: AC
  Administered 2024-05-06: 500 mg via INTRAVENOUS
  Filled 2024-05-06: qty 4

## 2024-05-06 NOTE — Patient Instructions (Signed)
 [facility] - A DEPT OF [facility]  Discharge Instructions: Thank you for choosing [facility] to provide your oncology and hematology care.   If you have a lab appointment with the Cancer Center, please go directly to the Cancer Center and check in at the registration area.   Wear comfortable clothing and clothing appropriate for easy access to any Portacath or PICC line.   We strive to give you quality time with your provider. You may need to reschedule your appointment if you arrive late (15 or more minutes).  Arriving late affects you and other patients whose appointments are after yours.  Also, if you miss three or more appointments without notifying the office, you may be dismissed from the clinic at the providers discretion.      For prescription refill requests, have your pharmacy contact our office and allow 72 hours for refills to be completed.    Today you received the following chemotherapy and/or immunotherapy agents mvasi  leucovorin  adrucil       To help prevent nausea and vomiting after your treatment, we encourage you to take your nausea medication as directed.  BELOW ARE SYMPTOMS THAT SHOULD BE REPORTED IMMEDIATELY: *FEVER GREATER THAN 100.4 F (38 C) OR HIGHER *CHILLS OR SWEATING *NAUSEA AND VOMITING THAT IS NOT CONTROLLED WITH YOUR NAUSEA MEDICATION *UNUSUAL SHORTNESS OF BREATH *UNUSUAL BRUISING OR BLEEDING *URINARY PROBLEMS (pain or burning when urinating, or frequent urination) *BOWEL PROBLEMS (unusual diarrhea, constipation, pain near the anus) TENDERNESS IN MOUTH AND THROAT WITH OR WITHOUT PRESENCE OF ULCERS (sore throat, sores in mouth, or a toothache) UNUSUAL RASH, SWELLING OR PAIN  UNUSUAL VAGINAL DISCHARGE OR ITCHING   Items with * indicate a potential emergency and should be followed up as soon as possible or go to the Emergency Department if any problems should occur.  Please show the CHEMOTHERAPY ALERT CARD or  IMMUNOTHERAPY ALERT CARD at check-in to the Emergency Department and triage nurse.  Should you have questions after your visit or need to cancel or reschedule your appointment, please contact [facility] - A DEPT OF [facility]  Dept: [phone]  and follow the prompts.  Office hours are 8:00 a.m. to 4:30 p.m. Monday - Friday. Please note that voicemails left after 4:00 p.m. may not be returned until the following business day.  We are closed weekends and major holidays. You have access to a nurse at all times for urgent questions. Please call the main number to the clinic Dept: [phone] and follow the prompts.   For any non-urgent questions, you may also contact your provider using MyChart. We now offer e-Visits for anyone 62 and older to request care online for non-urgent symptoms. For details visit [email or web address].   Also download the MyChart app! Go to the app store, search MyChart, open the app, select [place], and log in with your MyChart username and password.

## 2024-05-08 ENCOUNTER — Inpatient Hospital Stay

## 2024-05-19 NOTE — Assessment & Plan Note (Signed)
 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 Path showed overall Stage IIIB cancer.  -s/p 6 months adjuvant FOLFOX, completed 06/01/20, oxaliplatin  held for final 2 cycles due to neuropathy.  -s/p BSO and hysterectomy on 12/22/21 with Dr. Viktoria. Path revealed metastatic moderately differentiated colonic adenocarcinoma involving both ovaries and peritoneum -she restarted FOLFOX on 01/24/22. -Due to disease progression, chemotherapy changed to second line FOLFIRI and bevacizumab  on April 06, 2022. She has been tolerating moderately well, better now with dose reduction, will continue -Restaging CT scan from 09/27/2022 showed continous response in liver and peritoneal metastasis, no other new lesions.   -We discussed maintenance therapy option in future  -due to her PE in 10/2022, will hold beva for 3 months  -restaging CT 12/31/2022 showed stable disease and live met is not well defined on CT. She restarted beva on 01/17/23  -restaging CT 06/19/2023 showed overall stable disease (stable small lung nodules, no other evidence of cancer) Restaging CT 09/12/2023 showed no residual radiographic evidence of disease.   - We discussed option of maintenance therapy, treatment changed to capecitabine  and bevacizumab  on 10/02/2023.  Due to significant fatigue, I changed capecitabine  to 5-FU pump infusion in November 2025. -CT 12/18/23 and 03/08/2024 showed stable disease

## 2024-05-20 ENCOUNTER — Inpatient Hospital Stay

## 2024-05-20 ENCOUNTER — Encounter: Payer: Self-pay | Admitting: Nurse Practitioner

## 2024-05-20 ENCOUNTER — Encounter: Payer: Self-pay | Admitting: Hematology

## 2024-05-20 ENCOUNTER — Inpatient Hospital Stay: Admitting: Hematology

## 2024-05-20 VITALS — BP 166/66 | HR 64 | Temp 98.1°F | Resp 17 | Wt 224.5 lb

## 2024-05-20 VITALS — BP 155/62 | HR 59 | Temp 97.7°F | Resp 18

## 2024-05-20 DIAGNOSIS — R202 Paresthesia of skin: Secondary | ICD-10-CM

## 2024-05-20 DIAGNOSIS — C182 Malignant neoplasm of ascending colon: Secondary | ICD-10-CM

## 2024-05-20 DIAGNOSIS — Z5111 Encounter for antineoplastic chemotherapy: Secondary | ICD-10-CM | POA: Diagnosis not present

## 2024-05-20 DIAGNOSIS — R200 Anesthesia of skin: Secondary | ICD-10-CM | POA: Diagnosis not present

## 2024-05-20 LAB — CBC WITH DIFFERENTIAL (CANCER CENTER ONLY)
Abs Immature Granulocytes: 0.01 K/uL (ref 0.00–0.07)
Basophils Absolute: 0 K/uL (ref 0.0–0.1)
Basophils Relative: 1 %
Eosinophils Absolute: 0.2 K/uL (ref 0.0–0.5)
Eosinophils Relative: 3 %
HCT: 36.7 % (ref 36.0–46.0)
Hemoglobin: 12.1 g/dL (ref 12.0–15.0)
Immature Granulocytes: 0 %
Lymphocytes Relative: 32 %
Lymphs Abs: 1.9 K/uL (ref 0.7–4.0)
MCH: 34.7 pg — ABNORMAL HIGH (ref 26.0–34.0)
MCHC: 33 g/dL (ref 30.0–36.0)
MCV: 105.2 fL — ABNORMAL HIGH (ref 80.0–100.0)
Monocytes Absolute: 0.6 K/uL (ref 0.1–1.0)
Monocytes Relative: 10 %
Neutro Abs: 3.3 K/uL (ref 1.7–7.7)
Neutrophils Relative %: 54 %
Platelet Count: 165 K/uL (ref 150–400)
RBC: 3.49 MIL/uL — ABNORMAL LOW (ref 3.87–5.11)
RDW: 14.8 % (ref 11.5–15.5)
WBC Count: 6 K/uL (ref 4.0–10.5)
nRBC: 0 % (ref 0.0–0.2)

## 2024-05-20 LAB — CMP (CANCER CENTER ONLY)
ALT: 18 U/L (ref 0–44)
AST: 24 U/L (ref 15–41)
Albumin: 4.1 g/dL (ref 3.5–5.0)
Alkaline Phosphatase: 85 U/L (ref 38–126)
Anion gap: 10 (ref 5–15)
BUN: 23 mg/dL (ref 8–23)
CO2: 24 mmol/L (ref 22–32)
Calcium: 9.6 mg/dL (ref 8.9–10.3)
Chloride: 110 mmol/L (ref 98–111)
Creatinine: 1.01 mg/dL — ABNORMAL HIGH (ref 0.44–1.00)
GFR, Estimated: 57 mL/min — ABNORMAL LOW
Glucose, Bld: 114 mg/dL — ABNORMAL HIGH (ref 70–99)
Potassium: 4.4 mmol/L (ref 3.5–5.1)
Sodium: 143 mmol/L (ref 135–145)
Total Bilirubin: 1.1 mg/dL (ref 0.0–1.2)
Total Protein: 6.8 g/dL (ref 6.5–8.1)

## 2024-05-20 LAB — TOTAL PROTEIN, URINE DIPSTICK: Protein, ur: NEGATIVE mg/dL

## 2024-05-20 MED ORDER — SODIUM CHLORIDE 0.9 % IV SOLN
2400.0000 mg/m2 | INTRAVENOUS | Status: DC
  Administered 2024-05-20: 5000 mg via INTRAVENOUS
  Filled 2024-05-20: qty 100

## 2024-05-20 MED ORDER — SODIUM CHLORIDE 0.9 % IV SOLN: INTRAVENOUS | Status: DC

## 2024-05-20 MED ORDER — APIXABAN 5 MG PO TABS: 5.0000 mg | ORAL_TABLET | Freq: Two times a day (BID) | ORAL | 2 refills | Status: AC

## 2024-05-20 MED ORDER — SODIUM CHLORIDE 0.9 % IV SOLN
400.0000 mg/m2 | Freq: Once | INTRAVENOUS | Status: AC
  Administered 2024-05-20: 856 mg via INTRAVENOUS
  Filled 2024-05-20: qty 42.8

## 2024-05-20 MED ORDER — POTASSIUM CHLORIDE ER 10 MEQ PO TBCR: 10.0000 meq | EXTENDED_RELEASE_TABLET | Freq: Every day | ORAL | 2 refills | Status: AC

## 2024-05-20 MED ORDER — SODIUM CHLORIDE 0.9 % IV SOLN
5.0000 mg/kg | Freq: Once | INTRAVENOUS | Status: AC
  Administered 2024-05-20: 500 mg via INTRAVENOUS
  Filled 2024-05-20: qty 4

## 2024-05-20 MED ORDER — PREGABALIN 25 MG PO CAPS: 25.0000 mg | ORAL_CAPSULE | Freq: Every evening | ORAL | 2 refills | Status: AC

## 2024-05-20 MED ORDER — PREGABALIN 50 MG PO CAPS: 50.0000 mg | ORAL_CAPSULE | Freq: Two times a day (BID) | ORAL | 2 refills | Status: AC

## 2024-05-20 NOTE — Patient Instructions (Signed)
 [facility] - A DEPT OF [place] [place] [facility]  Discharge Instructions: Thank you for choosing [facility] to provide your oncology and hematology care.   If you have a lab appointment with the Cancer Center, please go directly to the Cancer Center and check in at the registration area.   Wear comfortable clothing and clothing appropriate for easy access to any Portacath or PICC line.   We strive to give you quality time with your provider. You may need to reschedule your appointment if you arrive late (15 or more minutes).  Arriving late affects you and other patients whose appointments are after yours.  Also, if you miss three or more appointments without notifying the office, you may be dismissed from the clinic at the provider's discretion.      For prescription refill requests, have your pharmacy contact our office and allow 72 hours for refills to be completed.    Today you received the following chemotherapy and/or immunotherapy agents: Bevacizumab , Leucovorin , Fluorouracil      To help prevent nausea and vomiting after your treatment, we encourage you to take your nausea medication as directed.  BELOW ARE SYMPTOMS THAT SHOULD BE REPORTED IMMEDIATELY: *FEVER GREATER THAN 100.4 F (38 C) OR HIGHER *CHILLS OR SWEATING *NAUSEA AND VOMITING THAT IS NOT CONTROLLED WITH YOUR NAUSEA MEDICATION *UNUSUAL SHORTNESS OF BREATH *UNUSUAL BRUISING OR BLEEDING *URINARY PROBLEMS (pain or burning when urinating, or frequent urination) *BOWEL PROBLEMS (unusual diarrhea, constipation, pain near the anus) TENDERNESS IN MOUTH AND THROAT WITH OR WITHOUT PRESENCE OF ULCERS (sore throat, sores in mouth, or a toothache) UNUSUAL RASH, SWELLING OR PAIN  UNUSUAL VAGINAL DISCHARGE OR ITCHING   Items with * indicate a potential emergency and should be followed up as soon as possible or go to the Emergency Department if any problems should occur.  Please show the CHEMOTHERAPY  ALERT CARD or IMMUNOTHERAPY ALERT CARD at check-in to the Emergency Department and triage nurse.  Should you have questions after your visit or need to cancel or reschedule your appointment, please contact [facility]  Dept: [phone]  and follow the prompts.  Office hours are 8:00 a.m. to 4:30 p.m. Monday - Friday. Please note that voicemails left after 4:00 p.m. may not be returned until the following business day.  We are closed weekends and major holidays. You have access to a nurse at all times for urgent questions. Please call the main number to the clinic Dept: [phone] and follow the prompts.   For any non-urgent questions, you may also contact your provider using MyChart. We now offer e-Visits for anyone 40 and older to request care online for non-urgent symptoms. For details visit [email or web address].   Also download the MyChart app! Go to the app store, search MyChart, open the app, select , and log in with your MyChart username and password.

## 2024-05-20 NOTE — Progress Notes (Signed)
 " [facility]   Telephone:([phone] Fax:([phone]) [phone]   Clinic Follow up Note   Patient Care Team: [name], MD as PCP - General (Family Medicine) [name], MD as Consulting Physician (General Surgery) [name], MD as Consulting Physician (Hematology) [name], MD as Consulting Physician (General Surgery) [name], MD as Consulting Physician (Hematology and Oncology) [facility]ll[facility], [facility]   Date of Service:  05/20/2024  CHIEF COMPLAINT: f/u of metastatic colon cancer  CURRENT THERAPY:  5-FU and bevacizumab   Oncology History   Malignant neoplasm of ascending colon (HCC) pT3N2aM0 stage IIB, MSS, metastatic to b/l ovaries and peritoneum and liver, KRAS mutation G12V (+)  -Initially diagnosed in 08/2019, s/p resection with Dr Debby on 11/01/19. Path showed overall Stage IIIB cancer.  -s/p 6 months adjuvant FOLFOX, completed 06/01/20, oxaliplatin  held for final 2 cycles due to neuropathy.  -s/p BSO and hysterectomy on 12/22/21 with Dr. Viktoria. Path revealed metastatic moderately differentiated colonic adenocarcinoma involving both ovaries and peritoneum -she restarted FOLFOX on 01/24/22. -Due to disease progression, chemotherapy changed to second line FOLFIRI and bevacizumab  on April 06, 2022. She has been tolerating moderately well, better now with dose reduction, will continue -Restaging CT scan from 09/27/2022 showed continous response in liver and peritoneal metastasis, no other new lesions.   -We discussed maintenance therapy option in future  -due to her PE in 10/2022, will hold beva for 3 months  -restaging CT 12/31/2022 showed stable disease and live met is not well defined on CT. She restarted beva on 01/17/23  -restaging CT 06/19/2023 showed overall stable disease (stable small lung nodules, no other evidence of cancer) Restaging CT 09/12/2023 showed no residual radiographic evidence of disease.   - We discussed option  of maintenance therapy, treatment changed to capecitabine  and bevacizumab  on 10/02/2023.  Due to significant fatigue, I changed capecitabine  to 5-FU pump infusion in November 2025. -CT 12/18/23 and 03/08/2024 showed stable disease  Assessment & Plan Metastatic ascending colon cancer She is receiving 5-fluorouracil  via pump and bevacizumab  for metastatic ascending colon cancer. She discontinued oral chemotherapy due to intolerable skin toxicity and fatigue, which improved after switching to the pump regimen. Blood counts are stable and urinalysis is negative. Last imaging was in November; next scan is planned for late February or March. - Administer scheduled chemotherapy infusion today. - Continue 5-fluorouracil  pump and bevacizumab  regimen. - Order next imaging at end of February or March. - Refilled all requested medications. - Discontinued Compazine  premedication as not required.  Chemotherapy-induced peripheral neuropathy She has persistent peripheral neuropathy, primarily in her feet, with gradual worsening. - Refilled pregabalin  25 mg and 50 mg prescriptions per current regimen.  Hypokalemia She is maintained on potassium supplementation for hypokalemia. - Refilled potassium prescription.  History of PE on anticoagulation She is on apixaban  for venous thromboembolism prophylaxis, dosed twice daily. - Refilled apixaban  prescription with two refills.  Plan - She is clinically stable, tolerating treatment well. - Lab reviewed, Adequate for treatment, will proceed chemo today and continue every 2 weeks - Follow-up in 2 weeks   SUMMARY OF ONCOLOGIC HISTORY: Oncology History Overview Note   Cancer Staging  Malignant neoplasm of female breast [facility]) Staging form: Breast, AJCC 7th Edition - Clinical stage from 06/11/2014: Stage 0 (Tis (DCIS), N0, M0) - Unsigned Staged by: Pathologist and managing physician Laterality: Left Estrogen receptor status: Positive Progesterone receptor  status: Positive Stage used in treatment planning: Yes National guidelines used in treatment planning: Yes Type  of national guideline used in treatment planning: NCCN - Pathologic stage from 07/03/2014: Stage Unknown (Tis (DCIS), NX, cM0) - Signed by Guinevere Chew, MD on 07/10/2014 Staged by: Pathologist Laterality: Left Estrogen receptor status: Positive Progesterone receptor status: Positive Stage used in treatment planning: Yes National guidelines used in treatment planning: Yes Type of national guideline used in treatment planning: NCCN Staging comments: Staged on final lumpectomy specimen by Dr. Loral.  Right colon cancer Staging form: Colon and Rectum, AJCC 8th Edition - Pathologic stage from 11/01/2019: Stage IIIB (pT3, pN2a, cM0) - Signed by [name], MD on 11/29/2019 Stage prefix: Initial diagnosis Histologic grading system: 4 grade system Histologic grade (G): G2 Residual tumor (R): R0 - None Tumor deposits (TD): Absent Perineural invasion (PNI): Absent Microsatellite instability (MSI): Stable KRAS mutation: Unknown NRAS mutation: Unknown BRAF mutation: Unknown     Malignant neoplasm of female breast (HCC) (Resolved)  05/29/2014 Initial Biopsy   Left breast needle core biopsy: Grade 2, DCIS with calcs. ER+ (100%), PR+ (96%).    06/04/2014 Initial Diagnosis   Left breast DCIS with calcifications, ER 100%, PR 96%   06/10/2014 Breast MRI   Left breast: 2.4 x 1.3 x 1.1 cm area of patchy non-mass enhancement upper outer quadrant includes postbiopsy seroma; Right breast: 1.2 cm previously biopsied stable benign fibroadenoma   06/12/2014 Procedure   Genetic counseling/testing: Identified 1 VUS on CHEK2 gene. Remainder of 17 gene panel tested negative and included: ATM, BARD1, BRCA 1/2, BRIP1, CDH1, CHEK2, EPCAM, MLH1, MSH2, MSH6, NBN, NF1, PALB2, PTEN, RAD50, RAD51C, RAD51D, STK11, and TP53.    07/01/2014 Surgery   Left breast lumpectomy ([facility]): Grade 1, DCIS, spanning 2.3 cm, 1  mm margin, ER 100%, PR 96%   07/31/2014 - 08/28/2014 Radiation Therapy   Adjuvant RT completed Signe). Left breast: Total dose 42.5 Gy over 17 fractions. Left breast boost: Total dose 7.5 Gy over 3 fractions.    09/14/2014 - 01/13/2020 Anti-estrogen oral therapy   Anastrazole 1mg  daily. Planned duration of treatment: 5 years ([place]). Completed in 01/2020.    09/25/2014 Survivorship   Survivorship Care Plan given to patient and reviewed with her in person.    03/02/2021 Imaging   CT CAP  IMPRESSION: 1. No findings of active/recurrent malignancy. Partial right hemicolectomy. 2. Endometrial stripe remains mildly thickened, but endometrial biopsy in May was negative for malignancy. 3. Progressive endplate sclerosis and endplate irregularity at T2-3, probably due to degenerative endplate findings. If the has referable upper thoracic pain/symptoms then thoracic spine MRI could be used for further workup. 4. Other imaging findings of potential clinical significance: Mild cardiomegaly. Aortic Atherosclerosis (ICD10-I70.0). Mild mitral valve calcification. Postoperative findings in the left breast with adjacent radiation port anteriorly in the left upper lobe. Tiny pulmonary nodules in the left lower lobe are unchanged from earliest available comparison of 10/17/2019 and probably benign although may merit surveillance. Multilevel lumbar impingement. Mild pelvic floor laxity.   Malignant neoplasm of ascending colon (HCC)  09/19/2019 Imaging   CT AP W contrast 09/19/19  IMPRESSION Fullness in the cecum, cannot exclude a mass. No evidence for metastatic disease is identified.    09/24/2019 Procedure   Colonoscopy by Dr Ernesto 09/24/19 IMPRESSION 1. The colon was redundant  2. Mild diverticulosis was noted through the entire examined colon 3. Single 12mm polyp was found in the ascending colon; polypectomy was performed using snare cautery and biopsy forceps 4. Mild diverticulosis was notes in  the descending colon and sigmoid colon.  5. Single polyp  was found in the sigmoid colon, polypectomy was performed with cold forceps.  6. Single polyp was found in the rectosigmoid colon; polypectomy was performed with cold snare  7. Small internal hemorrhoids  8. Large mass was found at the cecum; multiple biopsies of the area were performed using cold forceps; injection (tattooing) was performed distal to the mass.    09/24/2019 Initial Biopsy   INTERPRETATION AND DIAGNOSIS:  A. Cecum, biopsy:  Invasive moderately differentiated adenocarcinoma.  see comment  B. Polyp @ ascending colon, polypectomy:  Tubular Adenoma  C. Polyp @ sigmoid colon Polypectomy:  hyperplastic polyp.  D. Polyp @ rectosigmoid colon, Polypectomy:  Hyperplastic Polyp      10/16/2019 Imaging   CT Chest IMPRESSION: 1. Multiple small pulmonary nodules measuring 5 mm or less in size in the lungs. These are nonspecific and are typically considered statistically likely benign. However, given the patient's history of primary malignancy, close attention on follow-up studies is recommended to ensure stability. 2. Aortic atherosclerosis, in addition to right coronary artery disease. Assessment for potential risk factor modification, dietary therapy or pharmacologic therapy may be warranted, if clinically indicated. 3. There are calcifications of the aortic valve and mitral annulus. Echocardiographic correlation for evaluation of potential valvular dysfunction may be warranted if clinically indicated. 4. Small hiatal hernia.   Aortic Atherosclerosis (ICD10-I70.0).   11/01/2019 Initial Diagnosis   Colon cancer (HCC)   11/01/2019 Surgery   LAPAROSCOPIC PARTIAL COLECTOMY by Dr Debby and Dr Sheldon   11/01/2019 Pathology Results   FINAL MICROSCOPIC DIAGNOSIS:   A. COLON, PROXIMAL RIGHT, COLECTOMY:  - Invasive colonic adenocarcinoma, 5 cm.  - Tumor invades through the muscularis propria into pericolonic tissues.   -  Margins of resection are not involved.  - Metastatic carcinoma in (5) of (13) lymph nodes.  - See oncology table.    MSI Stable  Mismatch repair normal  MLH1 - Preserved nuclear expression (greater 50% tumor expression) MSH2 - Preserved nuclear expression (greater 50% tumor expression) MSH6 - Preserved nuclear expression (greater 50% tumor expression) PMS2 - Preserved nuclear expression (greater 50% tumor expression)   11/01/2019 Cancer Staging   Staging form: Colon and Rectum, AJCC 8th Edition - Pathologic stage from 11/01/2019: Stage IIIB (pT3, pN2a, cM0) - Signed by [name], MD on 11/29/2019   12/10/2019 Procedure   PAC placed 12/10/19   12/17/2019 - 06/01/2020 Chemotherapy   FOLFOX q2weeks starting in 2 weeks starting 12/17/19. Held 01/27/20-02/10/20 due to b/l PE. Oxaliplatin  held C11-12 due to neuropathy. Completed on 06/01/20   03/02/2021 Imaging   CT CAP  IMPRESSION: 1. No findings of active/recurrent malignancy. Partial right hemicolectomy. 2. Endometrial stripe remains mildly thickened, but endometrial biopsy in May was negative for malignancy. 3. Progressive endplate sclerosis and endplate irregularity at T2-3, probably due to degenerative endplate findings. If the has referable upper thoracic pain/symptoms then thoracic spine MRI could be used for further workup. 4. Other imaging findings of potential clinical significance: Mild cardiomegaly. Aortic Atherosclerosis (ICD10-I70.0). Mild mitral valve calcification. Postoperative findings in the left breast with adjacent radiation port anteriorly in the left upper lobe. Tiny pulmonary nodules in the left lower lobe are unchanged from earliest available comparison of 10/17/2019 and probably benign although may merit surveillance. Multilevel lumbar impingement. Mild pelvic floor laxity.   08/26/2021 Imaging   EXAM: CT CHEST, ABDOMEN, AND PELVIS WITH CONTRAST  IMPRESSION: 1. Stable examination without new or progressive findings  to suggest local recurrence or metastatic disease within the chest, abdomen, or pelvis.  2. Hepatomegaly with hepatic steatosis. 3. Sigmoid colonic diverticulosis without findings of acute diverticulitis. 4. Similar prominent endplate sclerosis and irregularity at T2-T3 is most consistent with Modic type endplate degenerative changes. However, if patient has referable upper thoracic pain consider further workup with thoracic spine MRI. 5. Similar thickening of the endometrial stripe measuring up to 8 mm, which was previously biopsied with results negative for malignancy. 6. Aortic Atherosclerosis (ICD10-I70.0).   11/10/2021 PET scan   IMPRESSION: 1. LEFT ovary is increased in size and is intensely hypermetabolic. While physiologic hypermetabolic ovarian tissue is not uncommon, the enlargement and asymmetric activity warrants further evaluation. Consider contrast pelvic MRI vs tissue sampling. 2. No evidence of metastatic colorectal carcinoma otherwise. 3. Post RIGHT hemicolectomy anatomy. 4. Evidence of radiation change in the LEFT upper lobe (remote breast cancer).     12/22/2021 Relapse/Recurrence    FINAL MICROSCOPIC DIAGNOSIS:   A. LEFT OVARY AND FALLOPIAN TUBE, SALPINGO OOPHORECTOMY:  - Metastatic moderately differentiated colonic adenocarcinoma involving left ovary  - Focal ovarian stromal calcification  - Segment of benign fallopian tube   B. UTERUS WITH RIGHT FALLOPIAN TUBE AND OVARY, HYSTERECTOMY AND SALPINGO-OOPHORECTOMY:  - Metastatic moderately differentiated colonic adenocarcinoma involving right ovary  - Focal invasive extensive adenomyosis  - Benign endometrial polyps  - Benign proliferative phase endometrium  - Hydrosalpinx of right fallopian tube  - Focal ovarian stromal calcification   C. PERITONEAL DEPOSITS, ANTERIOR CUL DE SAC, BIOPSY:  - Metastatic mucinous adenocarcinoma, consistent with colorectal primary    COMMENT:  Immunohistochemical stains show that  the tumor cells are positive for CK20 and CDX2 while they are negative for CK7 and PAX8, consistent with above interpretation.    01/24/2022 - 03/23/2022 Chemotherapy   Patient is on Treatment Plan : COLORECTAL FOLFOX q14d x 3 months     04/01/2022 Imaging   CT AP IMPRESSION: 1. New omental metastatic disease. 2. Vague hypoattenuating lesion in the inferior right hepatic lobe is new and also worrisome for metastatic disease. 3. Similar small right lower lobe nodules. Recommend continued attention on follow-up. 4. Steatotic enlarged liver. 5.  Aortic atherosclerosis (ICD10-I70.0).   04/06/2022 - 09/18/2023 Chemotherapy   Patient is on Treatment Plan : COLORECTAL FOLFIRI + Bevacizumab  q14d     04/21/2022 Imaging    IMPRESSION: 1. Stable chest CT. No evidence of metastatic disease. 2. Stable small pulmonary nodules, considered benign based on stability. 3. Stable postsurgical changes in the left breast and anterior left upper lobe radiation changes. 4. Aortic Atherosclerosis (ICD10-I70.0) and Emphysema (ICD10-J43.9).   04/21/2022 Imaging    IMPRESSION: 1. Small enhancing lesion inferiorly in the right hepatic lobe is typical of metastatic disease and stable from recent abdominal CT. No other definite liver lesions are identified. Mild hepatic steatosis. 2. No other significant abdominal findings. 3. Omental nodularity seen on CT is not well visualized by MRI.    Imaging     06/23/2022 Imaging    IMPRESSION: 1. Hypodense lesion of the inferior right lobe of the liver, hepatic segment VI is diminished in size, consistent with treatment response. 2. Tiny peritoneal and omental nodules identified by prior examination are diminished in size, consistent with treatment response. 3. Multiple tiny pulmonary nodules unchanged, most likely benign and incidental, however continued attention on follow-up warranted in the setting of known metastatic disease. 4. No evidence of new  metastatic disease in the chest, abdomen, or pelvis. 5. Status post partial right hemicolectomy and reanastomosis. 6. Diffuse mosaic attenuation of the airspaces, consistent with  small airways disease. 7. Hepatomegaly.   10/02/2023 - 02/26/2024 Chemotherapy   Patient is on Treatment Plan : COLORECTAL Bevacizumab  q21d     12/18/2023 Imaging   CT CAP with contrast IMPRESSION: 1. Prior partial right hemicolectomy with ileocolic anastomosis. No suspicious nodularity along the suture line. Prominent adjacent lymph nodes are similar to prior. 2. Stable tiny scattered pulmonary nodules. 3. No evidence of new or progressive disease in the chest, abdomen or pelvis. 4. Hepatomegaly with hepatic steatosis. 5. Aortic atherosclerosis.     03/11/2024 -  Chemotherapy   Patient is on Treatment Plan : COLORECTAL 5FU + Leucovorin  (Modified [place]) + Bevacizumab  q14d        Discussed the use of AI scribe software for clinical note transcription with the patient, who gave verbal consent to proceed.  History of Present Illness Kim Castro is a 79 year old female with metastatic ascending colon cancer who presents for follow-up of ongoing chemotherapy and management of chemotherapy-related adverse effects.  She transitioned from oral to infusion 5-fluorouracil  after severe xerosis and fingertip desquamation. Since the switch, skin toxicity has improved but she still has dry, thickened fingertips. She uses topical emollients, though frequent hand washing reduces benefit. She denies headache or discomfort. Fatigue is improved compared with oral therapy and she remains active indoors with household tasks and childcare.  She has chronic, progressively worsening peripheral neuropathy in the feet. She takes pregabalin  25 mg at night and 50 mg twice daily and is unsure of benefit. She describes intermittent ecchymosis-like sensation at the posterior heel but otherwise symptoms are stable. She denies  significant nausea with the current regimen and rarely needs antiemetics. She is not using topical analgesics.  She continues potassium supplementation for hypokalemia and apixaban  twice daily for venous thromboembolism prophylaxis.     All other systems were reviewed with the patient and are negative.  MEDICAL HISTORY:  Past Medical History:  Diagnosis Date   Aortic atherosclerosis    Arthritis    feet, lower back   Basal cell carcinoma    arm   BRCA gene mutation negative    Breast cancer of upper-outer quadrant of left female breast (HCC) 06/04/2014   Cataract    immature on the left   Colon cancer (HCC) 08/2019   Diabetes mellitus without complication (HCC)    Diverticulosis    Dizziness    > 68yrs ago;took Antivert    Family history of anesthesia complication    sister slow to wake up with anesthesia   Family history of breast cancer    Family history of colon cancer    Family history of uterine cancer    GERD (gastroesophageal reflux disease)    takes occasional TUMs   History of bronchitis    > 53yrs ago   History of colon polyps    History of hiatal hernia    Small noted on CT   History of pulmonary embolus (PE)    Hypertension    takes Losartan  daily and HCTZ   Iron deficiency anemia    Joint pain    Metastatic disease (HCC) 2023   peritoneum and liver mets   Numbness    to toes on each foot   Peripheral neuropathy    feet and toes   Personal history of radiation therapy    Pulmonary nodules    Noted on CT   Radiation 07/31/14-08/28/14   Left Breast 20 fxs   Seasonal allergies    takes Claritin prn   Urinary frequency  Vitamin D deficiency    takes VIt D daily    SURGICAL HISTORY: Past Surgical History:  Procedure Laterality Date   BREAST BIOPSY Bilateral    BREAST LUMPECTOMY Left    BREAST LUMPECTOMY WITH RADIOACTIVE SEED LOCALIZATION Left 07/01/2014   Procedure: LEFT BREAST LUMPECTOMY WITH RADIOACTIVE SEED LOCALIZATION;  Surgeon: Morene Olives, MD;  Location: [facility];  Service: General;  Laterality: Left;   CATARACT EXTRACTION Right    COLONOSCOPY  09/24/2019   [place]   LAPAROSCOPIC PARTIAL COLECTOMY N/A 11/01/2019   Procedure: LAPAROSCOPIC PARTIAL COLECTOMY;  Surgeon: [name], MD;  Location: [facility];  Service: General;  Laterality: N/A;   POLYPECTOMY     PORTACATH PLACEMENT N/A 12/10/2019   Procedure: INSERTION PORT-A-CATH ULTRASOUND GUIDED IN RIGHT IJ;  Surgeon: [name], MD;  Location: [facility];  Service: General;  Laterality: N/A;   ROBOTIC ASSISTED TOTAL HYSTERECTOMY Bilateral 12/22/2021   Procedure: XI ROBOTIC ASSISTED TOTAL HYSTERECTOMY WITH BILATERAL SALPINGO OOPHORECTOMY;  Surgeon: Viktoria Comer SAUNDERS, MD;  Location: [facility];  Service: Gynecology;  Laterality: Bilateral;   TOTAL KNEE ARTHROPLASTY Left 10/24/2012   Procedure: TOTAL KNEE ARTHROPLASTY;  Surgeon: Dempsey JINNY Sensor, MD;  Location: [facility];  Service: Orthopedics;  Laterality: Left;   TOTAL KNEE ARTHROPLASTY Right 01/09/2013   Procedure: TOTAL KNEE ARTHROPLASTY;  Surgeon: Dempsey JINNY Sensor, MD;  Location: [facility];  Service: Orthopedics;  Laterality: Right;   TUBAL LIGATION      I have reviewed the social history and family history with the patient and they are unchanged from previous note.  ALLERGIES:  is allergic to oxycodone .  MEDICATIONS:  Current Outpatient Medications  Medication Sig Dispense Refill   b complex vitamins capsule Take 1 capsule by mouth daily.     Cholecalciferol (VITAMIN D) 2000 UNITS CAPS Take 2,000 Units by mouth daily.      famotidine (PEPCID) 20 MG tablet Take 20 mg by mouth daily.     ibuprofen (ADVIL) 200 MG tablet Take 200 mg by mouth daily as needed (pain).     lidocaine -prilocaine  (EMLA ) cream Apply to affected area once 30 g 3   loratadine (CLARITIN) 10 MG tablet Take 10 mg by mouth daily as needed for allergies.     losartan  (COZAAR ) 25 MG tablet Take 1 tablet by mouth once daily 90 tablet 2   magic  mouthwash (nystatin , lidocaine , diphenhydrAMINE , alum & mag hydroxide) suspension Swish and swallow 4 times daily as needed for mouth pain. 140 mL 1   metFORMIN  (GLUCOPHAGE ) 500 MG tablet TAKE 1 TABLET BY MOUTH IN THE MORNING AND AT BEDTIME 180 tablet 3   Multiple Vitamins-Minerals (MULTIVITAMIN WITH MINERALS) tablet Take 1 tablet by mouth daily.     ondansetron  (ZOFRAN ) 8 MG tablet Take 1 tablet (8 mg total) by mouth every 8 (eight) hours as needed for nausea or vomiting. 30 tablet 1   pravastatin  (PRAVACHOL ) 10 MG tablet Take 1 tablet by mouth once daily 90 tablet 3   prochlorperazine  (COMPAZINE ) 10 MG tablet Take 1 tablet (10 mg total) by mouth every 6 (six) hours as needed for nausea or vomiting. 30 tablet 1   traMADol  (ULTRAM ) 50 MG tablet Take 1 tablet (50 mg total) by mouth every 6 (six) hours as needed. 30 tablet 0   triamcinolone  ointment (KENALOG ) 0.5 % Apply 1 Application topically 2 (two) times daily. 30 g 0   apixaban  (ELIQUIS ) 5 MG TABS tablet Take 1 tablet (5 mg total) by mouth 2 (  two) times daily. 60 tablet 2   potassium chloride  (KLOR-CON ) 10 MEQ tablet Take 1 tablet (10 mEq total) by mouth daily. 60 tablet 2   pregabalin  (LYRICA ) 25 MG capsule Take 1 capsule (25 mg total) by mouth at bedtime. 30 capsule 2   pregabalin  (LYRICA ) 50 MG capsule Take 1 capsule (50 mg total) by mouth 2 (two) times daily. 60 capsule 2   No current facility-administered medications for this visit.    PHYSICAL EXAMINATION: ECOG PERFORMANCE STATUS: 1 - Symptomatic but completely ambulatory  Vitals:   05/20/24 0838 05/20/24 0840  BP: (!) 158/70 (!) 166/66  Pulse: 64   Resp: 17   Temp: 98.1 F (36.7 C)   SpO2: 99%    Wt Readings from Last 3 Encounters:  05/20/24 224 lb 8 oz (101.8 kg)  05/06/24 221 lb 3.2 oz (100.3 kg)  04/22/24 218 lb 4.8 oz (99 kg)     GENERAL:alert, no distress and comfortable SKIN: skin color, texture, turgor are normal, no rashes or significant lesions EYES:  normal, Conjunctiva are pink and non-injected, sclera clear NECK: supple, thyroid normal size, non-tender, without nodularity LYMPH:  no palpable lymphadenopathy in the cervical, axillary  LUNGS: clear to auscultation and percussion with normal breathing effort HEART: regular rate & rhythm and no murmurs and no lower extremity edema ABDOMEN:abdomen soft, non-tender and normal bowel sounds Musculoskeletal:no cyanosis of digits and no clubbing  NEURO: alert & oriented x 3 with fluent speech, no focal motor/sensory deficits  Physical Exam    LABORATORY DATA:  I have reviewed the data as listed    Latest Ref Rng & Units 05/20/2024    8:03 AM 05/06/2024    9:59 AM 04/22/2024    7:55 AM  CBC  WBC 4.0 - 10.5 K/uL 6.0  7.8  6.6   Hemoglobin 12.0 - 15.0 g/dL 87.8  87.7  87.2   Hematocrit 36.0 - 46.0 % 36.7  36.5  37.9   Platelets 150 - 400 K/uL 165  172  181         Latest Ref Rng & Units 05/20/2024    8:03 AM 05/06/2024    9:59 AM 04/22/2024    7:55 AM  CMP  Glucose 70 - 99 mg/dL 885  892  883   BUN 8 - 23 mg/dL 23  24  24    Creatinine 0.44 - 1.00 mg/dL 8.98  9.07  9.04   Sodium 135 - 145 mmol/L 143  143  144   Potassium 3.5 - 5.1 mmol/L 4.4  4.5  4.7   Chloride 98 - 111 mmol/L 110  109  108   CO2 22 - 32 mmol/L 24  24  26    Calcium  8.9 - 10.3 mg/dL 9.6  9.4  9.6   Total Protein 6.5 - 8.1 g/dL 6.8  6.8  7.1   Total Bilirubin 0.0 - 1.2 mg/dL 1.1  1.2  1.1   Alkaline Phos 38 - 126 U/L 85  83  82   AST 15 - 41 U/L 24  28  22    ALT 0 - 44 U/L 18  16  12        RADIOGRAPHIC STUDIES: I have personally reviewed the radiological images as listed and agreed with the findings in the report. No results found.    Orders Placed This Encounter  Procedures   CBC with Differential (Cancer Center Only)    Standing Status:   Future    Expected Date:   06/17/2024  Expiration Date:   06/17/2025   CMP (Cancer Center only)    Standing Status:   Future    Expected Date:   06/17/2024     Expiration Date:   06/17/2025   CBC with Differential (Cancer Center Only)    Standing Status:   Future    Expected Date:   07/01/2024    Expiration Date:   07/01/2025   CMP (Cancer Center only)    Standing Status:   Future    Expected Date:   07/01/2024    Expiration Date:   07/01/2025   CBC with Differential (Cancer Center Only)    Standing Status:   Future    Expected Date:   07/15/2024    Expiration Date:   07/15/2025   CMP (Cancer Center only)    Standing Status:   Future    Expected Date:   07/15/2024    Expiration Date:   07/15/2025   All questions were answered. The patient knows to call the clinic with any problems, questions or concerns. No barriers to learning was detected. The total time spent in the appointment was 25 minutes, including review of chart and various tests results, discussions about plan of care and coordination of care plan     Onita Mattock, MD 05/20/2024     "

## 2024-05-22 ENCOUNTER — Inpatient Hospital Stay

## 2024-05-23 ENCOUNTER — Encounter: Payer: Self-pay | Admitting: Internal Medicine

## 2024-05-23 ENCOUNTER — Telehealth: Payer: Self-pay | Admitting: Internal Medicine

## 2024-05-23 NOTE — Telephone Encounter (Signed)
 Message left about possible colonoscopy  MyChart message sent also  I have communicated with Dr. Lanny of oncology.  Patient may hold Eliquis  x 2 days prior to colonoscopy if she wishes to proceed.  She is currently getting therapy for metastatic disease but there is no evidence of disease on imaging, CEA mildly elevated

## 2024-06-02 NOTE — Assessment & Plan Note (Signed)
 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 Path showed overall Stage IIIB cancer.  -s/p 6 months adjuvant FOLFOX, completed 06/01/20, oxaliplatin  held for final 2 cycles due to neuropathy.  -s/p BSO and hysterectomy on 12/22/21 with Dr. Viktoria. Path revealed metastatic moderately differentiated colonic adenocarcinoma involving both ovaries and peritoneum -she restarted FOLFOX on 01/24/22. -Due to disease progression, chemotherapy changed to second line FOLFIRI and bevacizumab  on April 06, 2022. She has been tolerating moderately well, better now with dose reduction, will continue -Restaging CT scan from 09/27/2022 showed continous response in liver and peritoneal metastasis, no other new lesions.   -We discussed maintenance therapy option in future  -due to her PE in 10/2022, will hold beva for 3 months  -restaging CT 12/31/2022 showed stable disease and live met is not well defined on CT. She restarted beva on 01/17/23  -restaging CT 06/19/2023 showed overall stable disease (stable small lung nodules, no other evidence of cancer) Restaging CT 09/12/2023 showed no residual radiographic evidence of disease.   - We discussed option of maintenance therapy, treatment changed to capecitabine  and bevacizumab  on 10/02/2023.  Due to significant fatigue, capecitabine  was changed to 5-FU pump infusion in November 2025. -CT 12/18/23 and 03/08/2024 showed stable disease - 05/06/2024 -presents for cycle 5 day 1 maintenance 5-FU pump/leucovorin  and bevacizumab .  Tolerating well overall.

## 2024-06-03 ENCOUNTER — Inpatient Hospital Stay

## 2024-06-03 ENCOUNTER — Inpatient Hospital Stay: Admitting: Hematology

## 2024-06-03 ENCOUNTER — Inpatient Hospital Stay: Admitting: Nurse Practitioner

## 2024-06-03 DIAGNOSIS — C182 Malignant neoplasm of ascending colon: Secondary | ICD-10-CM

## 2024-06-04 ENCOUNTER — Other Ambulatory Visit: Payer: Self-pay | Admitting: Hematology

## 2024-06-04 DIAGNOSIS — C182 Malignant neoplasm of ascending colon: Secondary | ICD-10-CM

## 2024-06-05 ENCOUNTER — Inpatient Hospital Stay

## 2024-06-12 ENCOUNTER — Ambulatory Visit: Admitting: Family Medicine

## 2024-06-17 ENCOUNTER — Inpatient Hospital Stay

## 2024-06-17 ENCOUNTER — Inpatient Hospital Stay: Admitting: Hematology

## 2024-06-19 ENCOUNTER — Inpatient Hospital Stay

## 2024-07-01 ENCOUNTER — Inpatient Hospital Stay

## 2024-07-01 ENCOUNTER — Inpatient Hospital Stay: Admitting: Hematology

## 2024-07-03 ENCOUNTER — Inpatient Hospital Stay

## 2024-07-15 ENCOUNTER — Inpatient Hospital Stay: Admitting: Hematology

## 2024-07-15 ENCOUNTER — Inpatient Hospital Stay

## 2024-07-17 ENCOUNTER — Inpatient Hospital Stay

## 2024-11-11 ENCOUNTER — Ambulatory Visit
